# Patient Record
Sex: Male | Born: 1975
Health system: Southern US, Community
[De-identification: ages and names within clinical notes are randomized; demographics above are authoritative.]

## PROBLEM LIST (undated history)

## (undated) DIAGNOSIS — F32A Depression, unspecified: Secondary | ICD-10-CM

## (undated) DIAGNOSIS — T7840XA Allergy, unspecified, initial encounter: Secondary | ICD-10-CM

## (undated) DIAGNOSIS — I639 Cerebral infarction, unspecified: Secondary | ICD-10-CM

## (undated) DIAGNOSIS — K219 Gastro-esophageal reflux disease without esophagitis: Secondary | ICD-10-CM

## (undated) DIAGNOSIS — E119 Type 2 diabetes mellitus without complications: Secondary | ICD-10-CM

## (undated) DIAGNOSIS — I10 Essential (primary) hypertension: Secondary | ICD-10-CM

## (undated) DIAGNOSIS — R569 Unspecified convulsions: Secondary | ICD-10-CM

## (undated) DIAGNOSIS — G839 Paralytic syndrome, unspecified: Secondary | ICD-10-CM

## (undated) HISTORY — PX: APPENDECTOMY: SHX54

## (undated) HISTORY — DX: Essential (primary) hypertension: I10

## (undated) HISTORY — DX: Unspecified convulsions: R56.9

## (undated) HISTORY — DX: Allergy, unspecified, initial encounter: T78.40XA

## (undated) HISTORY — DX: Depression, unspecified: F32.A

## (undated) HISTORY — DX: Type 2 diabetes mellitus without complications: E11.9

---

## 2001-03-16 ENCOUNTER — Emergency Department (HOSPITAL_COMMUNITY): Admission: EM | Admit: 2001-03-16 | Discharge: 2001-03-16 | Payer: Self-pay | Admitting: Emergency Medicine

## 2004-01-09 ENCOUNTER — Emergency Department (HOSPITAL_COMMUNITY): Admission: EM | Admit: 2004-01-09 | Discharge: 2004-01-09 | Payer: Self-pay

## 2004-01-29 ENCOUNTER — Encounter: Admission: RE | Admit: 2004-01-29 | Discharge: 2004-04-28 | Payer: Self-pay | Admitting: Family Medicine

## 2011-11-19 ENCOUNTER — Ambulatory Visit (INDEPENDENT_AMBULATORY_CARE_PROVIDER_SITE_OTHER): Payer: Managed Care, Other (non HMO) | Admitting: Family Medicine

## 2011-11-19 ENCOUNTER — Encounter: Payer: Self-pay | Admitting: Family Medicine

## 2011-11-19 ENCOUNTER — Ambulatory Visit: Payer: Managed Care, Other (non HMO)

## 2011-11-19 VITALS — BP 165/105 | HR 59 | Temp 98.2°F | Resp 16 | Ht 72.0 in | Wt 244.8 lb

## 2011-11-19 DIAGNOSIS — S8990XA Unspecified injury of unspecified lower leg, initial encounter: Secondary | ICD-10-CM

## 2011-11-19 DIAGNOSIS — M25569 Pain in unspecified knee: Secondary | ICD-10-CM

## 2011-11-19 DIAGNOSIS — I10 Essential (primary) hypertension: Secondary | ICD-10-CM

## 2011-11-19 DIAGNOSIS — E7800 Pure hypercholesterolemia, unspecified: Secondary | ICD-10-CM

## 2011-11-19 DIAGNOSIS — E119 Type 2 diabetes mellitus without complications: Secondary | ICD-10-CM

## 2011-11-19 DIAGNOSIS — S99929A Unspecified injury of unspecified foot, initial encounter: Secondary | ICD-10-CM

## 2011-11-19 LAB — COMPREHENSIVE METABOLIC PANEL
ALT: 18 U/L (ref 0–53)
AST: 27 U/L (ref 0–37)
Alkaline Phosphatase: 83 U/L (ref 39–117)
Glucose, Bld: 95 mg/dL (ref 70–99)
Potassium: 4 mEq/L (ref 3.5–5.3)
Sodium: 142 mEq/L (ref 135–145)
Total Bilirubin: 0.8 mg/dL (ref 0.3–1.2)
Total Protein: 7.4 g/dL (ref 6.0–8.3)

## 2011-11-19 LAB — POCT GLYCOSYLATED HEMOGLOBIN (HGB A1C): Hemoglobin A1C: 6.2

## 2011-11-19 IMAGING — CR DG KNEE COMPLETE 4+V*L*
4 series · 4 of 4 positions shown · non-contrast
Comparison: None

CLINICAL DATA: Pain and swelling

LEFT KNEE - COMPLETE 4+ VIEW

[AP]
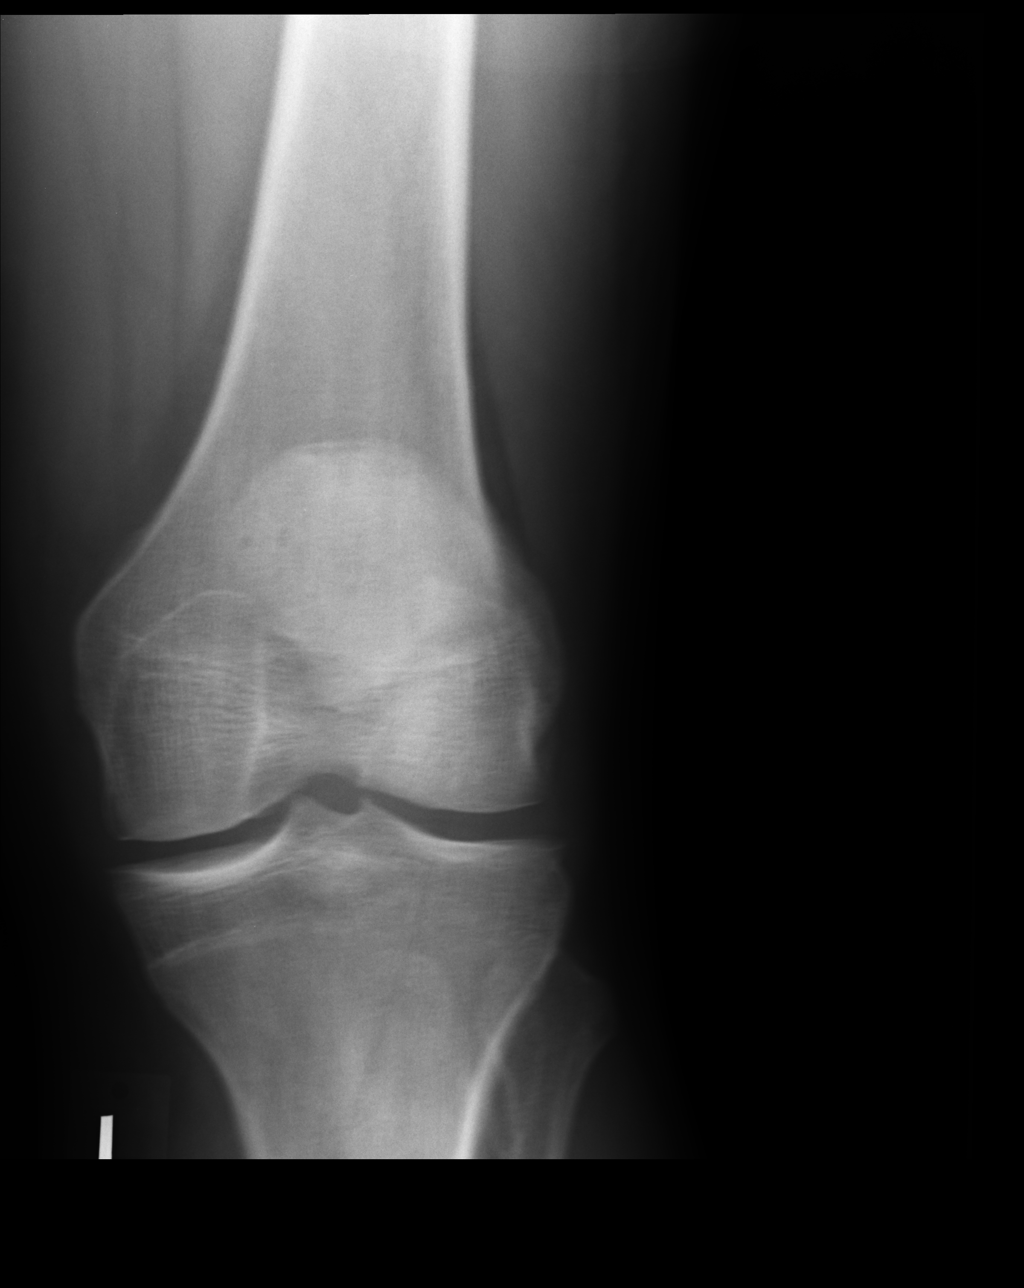

[lateral]
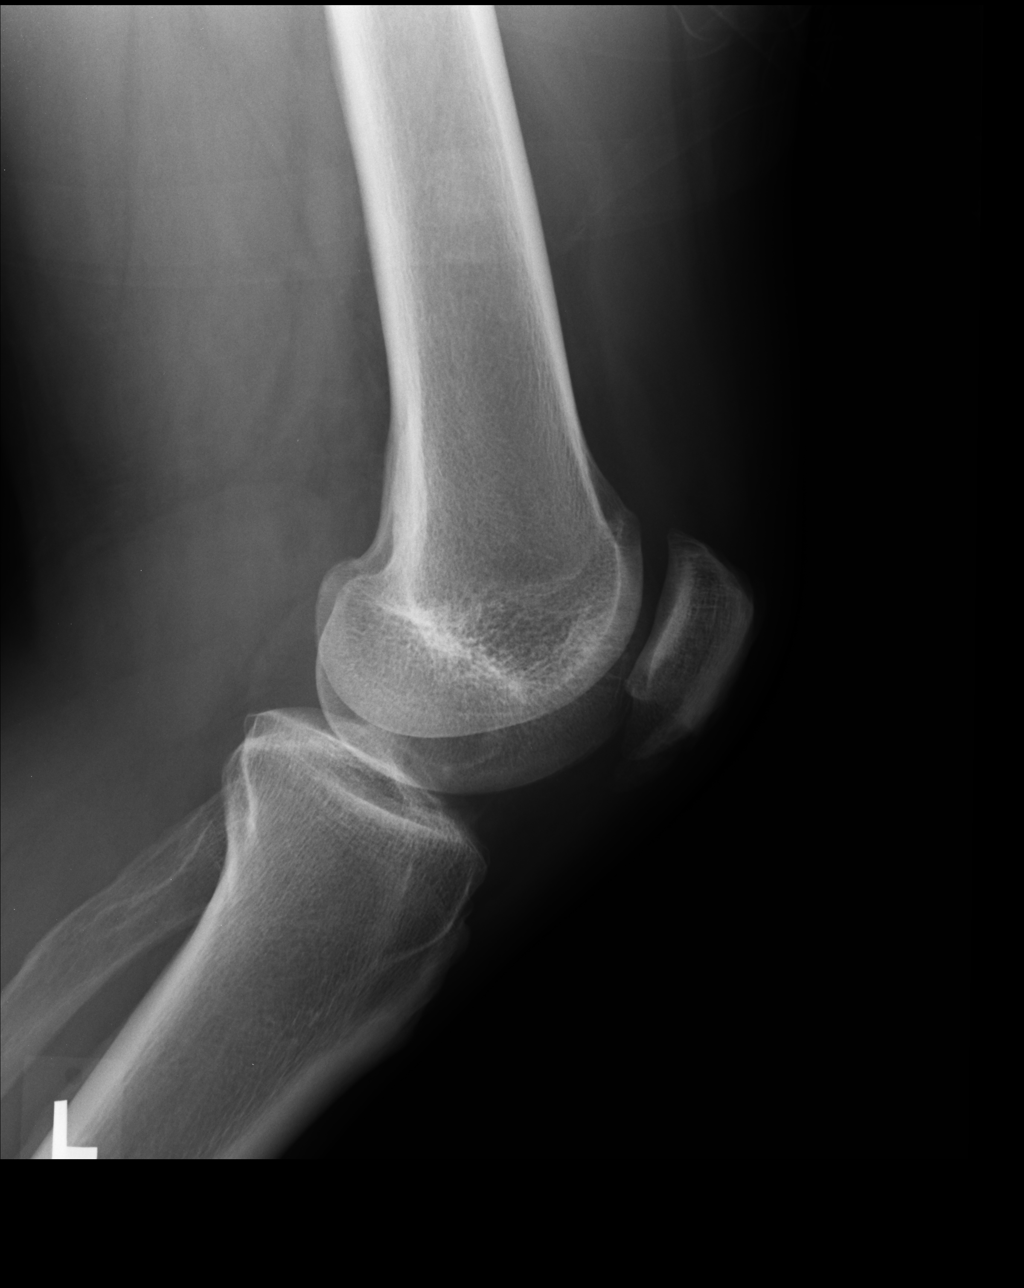

[ap ext rot]
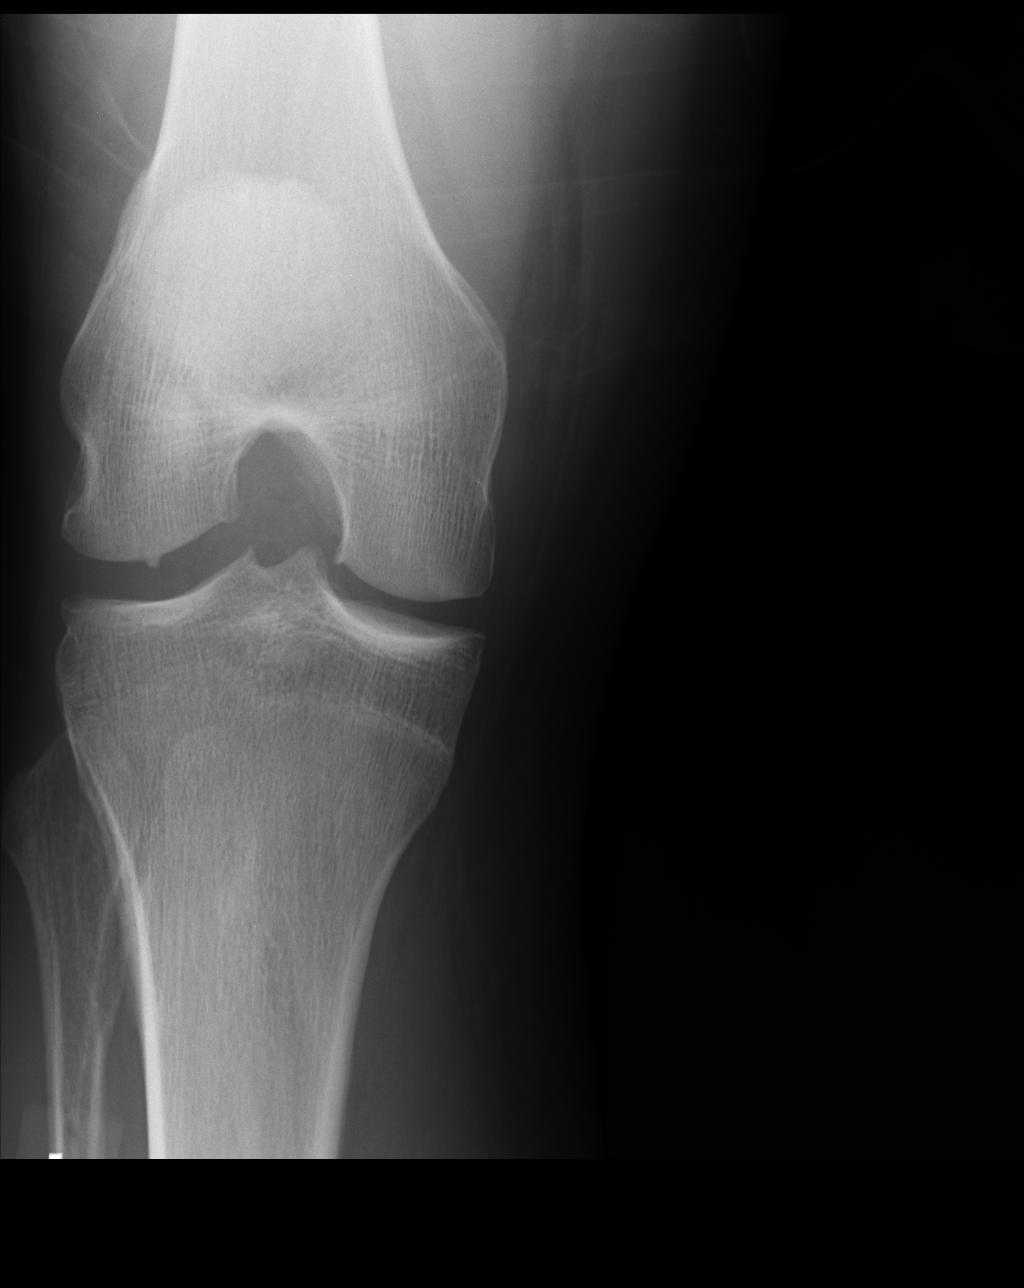

[ap int rot]
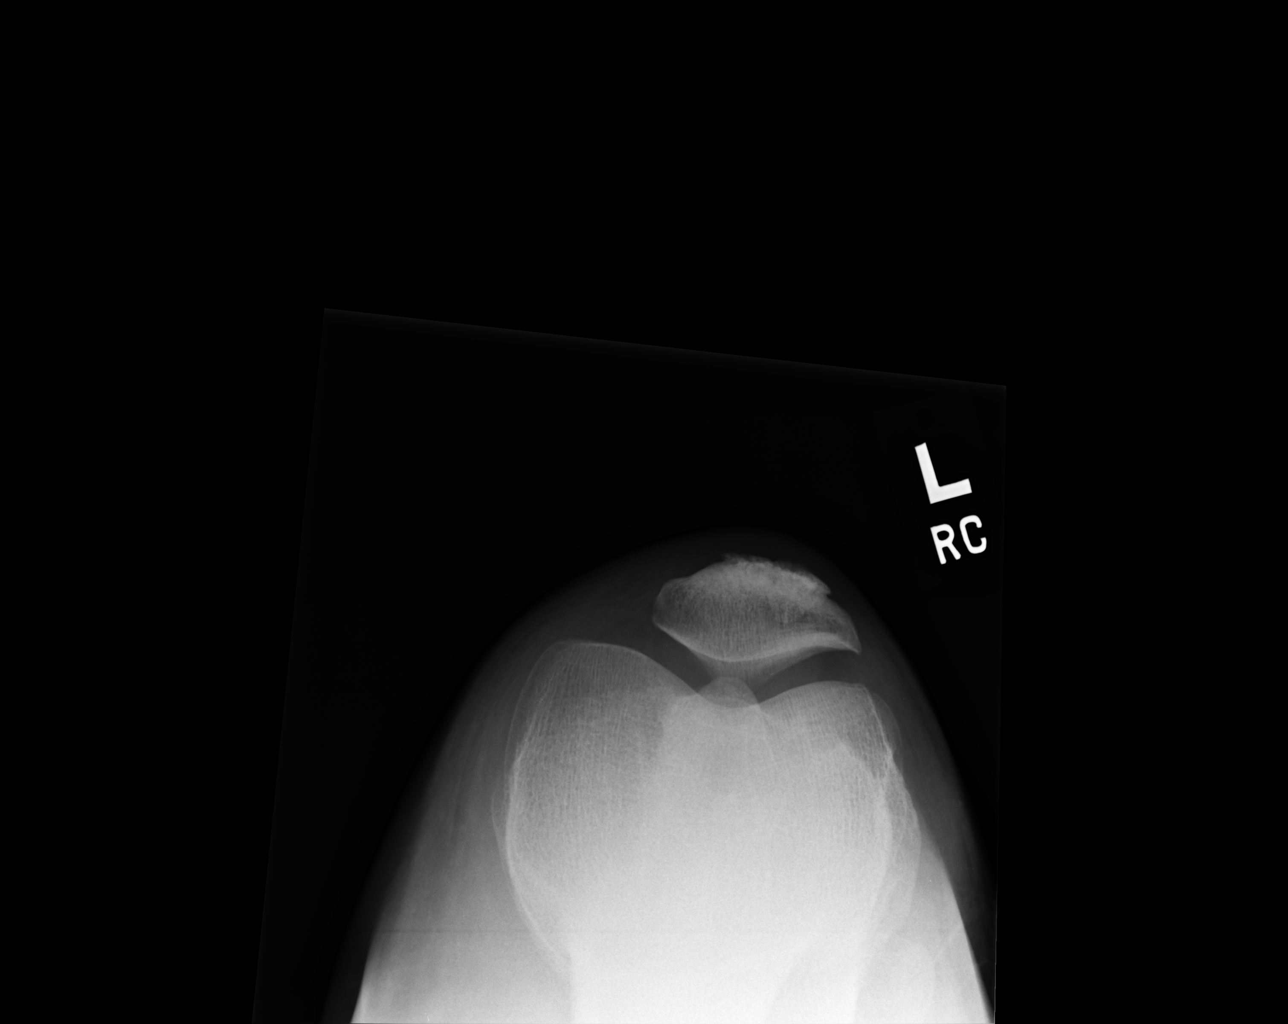

[4 of 4 positions shown; findings below may reference images not displayed]

FINDINGS: No joint effusion.  There is no fracture or subluxation identified.
There is mild osteoarthritis identified.  Changes include medial
compartment narrowing, sharpening of the tibial spines and marginal
spur formation.
IMPRESSION: 1.  No acute findings.
2.  Osteoarthritis.

Clinically significant discrepancy from primary report, if
provided: None

## 2011-11-19 MED ORDER — MELOXICAM 15 MG PO TABS: 15.0000 mg | ORAL_TABLET | Freq: Every day | ORAL | Status: DC

## 2011-11-19 MED ORDER — LISINOPRIL 20 MG PO TABS: 20.0000 mg | ORAL_TABLET | Freq: Every day | ORAL | Status: DC

## 2011-11-19 NOTE — Progress Notes (Signed)
Patient Name: Jacob [name] Date of Birth: 01-24-1976 Medical Record Number: [id number] Gender: male Date of Encounter: 11/19/2011  History of Present Illness:  Jacob [name] is a 36 y.o. very pleasant male patient who presents with the following:  He is here today as a new patient with a few issues.   He hurt his right knee about 2 weeks ago. He was playing with his kids- was more active than usual.  Then he was climbing stairs and felt tightness and swelling in the knee.  He has been trying to elevate and ice the knee.  He has had some trouble with this knee for a few years.  Never had any surgery.   The knees clicks and pops, does not get stuck.  He does note some instability as well.  It hurts along the medial joint line.    He also has DM and HTN.  He is not on any medication now.  He had been a patient at [facility], but now he has insurance again.  He has been out of medication for about a year.  He had been on metformin, lipitor and ?for BP.  He has been keeping an eye on his glucose and had a free cholesterol check.     There is no problem list on file for this patient.  No past medical history on file. No past surgical history on file. History  Substance Use Topics  . Smoking status: Never Smoker   . Smokeless tobacco: Not on file  . Alcohol Use: Not on file   No family history on file. No Known Allergies  Medication list has been reviewed and updated.  Prior to Admission medications   Not on File    Review of Systems:  As per HPI- otherwise negative.   Physical Examination: Filed Vitals:   11/19/11 0824  BP: 165/105  Pulse: 59  Temp: 98.2 F (36.8 C)  Resp: 16   Filed Vitals:   11/19/11 0824  Height: 6' (1.829 m)  Weight: 244 lb 12.8 oz (111.041 kg)   Body mass index is 33.20 kg/(m^2). Ideal Body Weight: Weight in (lb) to have BMI = 25: 183.9   GEN: WDWN, NAD, Non-toxic, A & O x 3 HEENT: Atraumatic, Normocephalic. Neck supple. No masses,  No LAD. Ears and Nose: No external deformity. CV: RRR, No M/G/R. No JVD. No thrill. No extra heart sounds. PULM: CTA B, no wheezes, crackles, rhonchi. No retractions. No resp. distress. No accessory muscle use. ABD: S, NT, ND, +BS. No rebound. No HSM. EXTR: No c/c/e NEURO Normal gait.  PSYCH: Normally interactive. Conversant. Not depressed or anxious appearing.  Calm demeanor.   [facility] reading (PRIMARY) by  Dr. Patsy Lager.  Normal left knee.   LEFT KNEE - COMPLETE 4+ VIEW  Comparison: None  Findings:  No joint effusion. There is no fracture or subluxation identified. There is mild osteoarthritis identified. Changes include medial compartment narrowing, sharpening of the tibial spines and marginal spur formation.  IMPRESSION:  1. No acute findings. 2. Osteoarthritis.  Results for orders placed in visit on 11/19/11  POCT GLYCOSYLATED HEMOGLOBIN (HGB A1C)      Component Value Range   Hemoglobin A1C 6.2      Assessment and Plan: 1. Knee pain  DG Knee Complete 4 Views Left, meloxicam (MOBIC) 15 MG tablet  2. Knee injury  DG Knee Complete 4 Views Left  3. Diabetes mellitus  POCT glycosylated hemoglobin (Hb A1C)  4. High  cholesterol  Comprehensive metabolic panel  5. Hypertension  Comprehensive metabolic panel, lisinopril (PRINIVIL,ZESTRIL) 20 MG tablet   Suspect that Jacob [name] has an meniscal tear in his knee.  Gave hinged knee brace and mobic to use prn.  He declined ortho referral for now, but will let me know if not doing better soon.   DM controlled with diet, does not need to restart medication at this time. Start lisinopril for HTN, follow up pending BW.  Plan recheck of HTN in about a month.   Abbe Amsterdam, MD

## 2011-11-19 NOTE — Patient Instructions (Addendum)
Wear your knee brace as needed, and use mobic when needed for pain.  If your knee continues to bother you a lot please call and I will refer you to orthopedics  Take a 1/2 of your lisinopril (blood pressure) medication for the first few days.  If your blood pressure is going under 125/80 on this dose, we will want to keep you on just 10 mg.  If your blood pressure remains higher go ahead and start taking a whole pill (20 mg).  I did blood work today to check on your kidneys.  I will be in touch with you with these results.    Let's check back in about one month for blood pressure evaluation.

## 2011-12-23 ENCOUNTER — Other Ambulatory Visit: Payer: Self-pay | Admitting: Family Medicine

## 2012-02-02 ENCOUNTER — Other Ambulatory Visit: Payer: Self-pay | Admitting: Physician Assistant

## 2013-10-15 ENCOUNTER — Ambulatory Visit (INDEPENDENT_AMBULATORY_CARE_PROVIDER_SITE_OTHER): Payer: BC Managed Care – PPO | Admitting: Family Medicine

## 2013-10-15 VITALS — BP 168/100 | HR 87 | Temp 98.6°F | Ht 72.0 in | Wt 235.4 lb

## 2013-10-15 DIAGNOSIS — I10 Essential (primary) hypertension: Secondary | ICD-10-CM

## 2013-10-15 DIAGNOSIS — J329 Chronic sinusitis, unspecified: Secondary | ICD-10-CM

## 2013-10-15 MED ORDER — HYDROCODONE-HOMATROPINE 5-1.5 MG/5ML PO SYRP: 5.0000 mL | ORAL_SOLUTION | Freq: Three times a day (TID) | ORAL | Status: DC | PRN

## 2013-10-15 MED ORDER — PREDNISONE 20 MG PO TABS: ORAL_TABLET | ORAL | Status: DC

## 2013-10-15 MED ORDER — AMLODIPINE BESYLATE 5 MG PO TABS
5.0000 mg | ORAL_TABLET | Freq: Every day | ORAL | Status: DC
Start: 2013-10-15 — End: 2018-02-23

## 2013-10-15 MED ORDER — AMOXICILLIN 875 MG PO TABS: 875.0000 mg | ORAL_TABLET | Freq: Two times a day (BID) | ORAL | Status: DC

## 2013-10-15 NOTE — Progress Notes (Signed)
    Patient ID: Jacob [name] MRN: [id number], DOB: 1976/01/19, 38 y.o. Date of Encounter: 10/15/2013, 2:25 PM  Primary Physician: No PCP Per Patient  Chief Complaint:  Chief Complaint  Patient presents with  . Sinusitis    x 2 weeks    HPI: 38 y.o. year old male presents with 10 day history of nasal congestion, post nasal drip, sore throat, sinus pressure, and cough. Afebrile. No chills. Nasal congestion thick and green/yellow. Sinus pressure is the worst symptom. Cough is productive secondary to post nasal drip and not associated with time of day. Ears feel full, leading to sensation of muffled hearing. Has tried OTC cold preps without success. No GI complaints.     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  No h/o recurrent sinus symptoms  No recent antibiotics, recent travels, vomiting, or sick contacts   No leg trauma, sedentary periods, h/o cancer, or tobacco use.  Past Medical History  Diagnosis Date  . Hypertension   . Allergy      Home Meds: Prior to Admission medications   Not on File    Allergies: No Known Allergies  History   Social History  . Marital Status: Single    Spouse Name: N/A    Number of Children: N/A  . Years of Education: N/A   Occupational History  . Not on file.   Social History Main Topics  . Smoking status: Never Smoker   . Smokeless tobacco: Not on file  . Alcohol Use: Not on file  . Drug Use: Not on file  . Sexual Activity: Not on file   Other Topics Concern  . Not on file   Social History Narrative  . No narrative on file     Review of Systems: Constitutional: negative for chills, fever, night sweats or weight changes Cardiovascular: negative for chest pain or palpitations Respiratory: negative for hemoptysis, wheezing, or shortness of breath Abdominal: negative for abdominal pain, nausea, vomiting or diarrhea Dermatological: negative for rash Neurologic: negative for headache   Physical Exam: Blood  pressure 168/100, pulse 87, temperature 98.6 F (37 C), temperature source Oral, height 6' (1.829 m), weight 235 lb 6.4 oz (106.777 kg), SpO2 98.00%., Body mass index is 31.92 kg/(m^2). General: Well developed, well nourished, in no acute distress. Head: Normocephalic, atraumatic, eyes without discharge, sclera non-icteric, nares are congested. Bilateral auditory canals clear, TM's are without perforation, pearly grey with reflective cone of light bilaterally. Serous effusion bilaterally behind TM's. Maxillary sinus TTP. Oral cavity moist, dentition normal. Posterior pharynx with post nasal drip and mild erythema. No peritonsillar abscess or tonsillar exudate. Neck: Supple. No thyromegaly. Full ROM. No lymphadenopathy. Lungs: Clear bilaterally to auscultation without wheezes, rales, or rhonchi. Breathing is unlabored.  Heart: RRR with S1 S2. No murmurs, rubs, or gallops appreciated. Msk:  Strength and tone normal for age. Extremities: No clubbing or cyanosis. No edema. Neuro: Alert and oriented X 3. Moves all extremities spontaneously. CNII-XII grossly in tact. Psych:  Responds to questions appropriately with a normal affect.     ASSESSMENT AND PLAN:  38 y.o. year old male with sinusitis Sinus abscess - Plan: HYDROcodone-homatropine (HYCODAN) 5-1.5 MG/5ML syrup, predniSONE (DELTASONE) 20 MG tablet, amoxicillin (AMOXIL) 875 MG tablet  Hypertension Amlodipine 5 mg qd Recheck bp in one month  Signed, Elvina Sidle, MD   -  -Tylenol/Motrin prn -Rest/fluids -RTC precautions -RTC 3-5 days if no improvement  Signed, Elvina Sidle, MD 10/15/2013 2:25 PM

## 2013-10-15 NOTE — Patient Instructions (Signed)
Return in 1 month if blood pressure remains elevated.    Sinusitis Sinusitis is redness, soreness, and swelling (inflammation) of the paranasal sinuses. Paranasal sinuses are air pockets within the bones of your face (beneath the eyes, the middle of the forehead, or above the eyes). In healthy paranasal sinuses, mucus is able to drain out, and air is able to circulate through them by way of your nose. However, when your paranasal sinuses are inflamed, mucus and air can become trapped. This can allow bacteria and other germs to grow and cause infection. Sinusitis can develop quickly and last only a short time (acute) or continue over a long period (chronic). Sinusitis that lasts for more than 12 weeks is considered chronic.  CAUSES  Causes of sinusitis include:  Allergies.  Structural abnormalities, such as displacement of the cartilage that separates your nostrils (deviated septum), which can decrease the air flow through your nose and sinuses and affect sinus drainage.  Functional abnormalities, such as when the small hairs (cilia) that line your sinuses and help remove mucus do not work properly or are not present. SYMPTOMS  Symptoms of acute and chronic sinusitis are the same. The primary symptoms are pain and pressure around the affected sinuses. Other symptoms include:  Upper toothache.  Earache.  Headache.  Bad breath.  Decreased sense of smell and taste.  A cough, which worsens when you are lying flat.  Fatigue.  Fever.  Thick drainage from your nose, which often is green and may contain pus (purulent).  Swelling and warmth over the affected sinuses. DIAGNOSIS  Your caregiver will perform a physical exam. During the exam, your caregiver may:  Look in your nose for signs of abnormal growths in your nostrils (nasal polyps).  Tap over the affected sinus to check for signs of infection.  View the inside of your sinuses (endoscopy) with a special imaging device with a  light attached (endoscope), which is inserted into your sinuses. If your caregiver suspects that you have chronic sinusitis, one or more of the following tests may be recommended:  Allergy tests.  Nasal culture A sample of mucus is taken from your nose and sent to a lab and screened for bacteria.  Nasal cytology A sample of mucus is taken from your nose and examined by your caregiver to determine if your sinusitis is related to an allergy. TREATMENT  Most cases of acute sinusitis are related to a viral infection and will resolve on their own within 10 days. Sometimes medicines are prescribed to help relieve symptoms (pain medicine, decongestants, nasal steroid sprays, or saline sprays).  However, for sinusitis related to a bacterial infection, your caregiver will prescribe antibiotic medicines. These are medicines that will help kill the bacteria causing the infection.  Rarely, sinusitis is caused by a fungal infection. In theses cases, your caregiver will prescribe antifungal medicine. For some cases of chronic sinusitis, surgery is needed. Generally, these are cases in which sinusitis recurs more than 3 times per year, despite other treatments. HOME CARE INSTRUCTIONS   Drink plenty of water. Water helps thin the mucus so your sinuses can drain more easily.  Use a humidifier.  Inhale steam 3 to 4 times a day (for example, sit in the bathroom with the shower running).  Apply a warm, moist washcloth to your face 3 to 4 times a day, or as directed by your caregiver.  Use saline nasal sprays to help moisten and clean your sinuses.  Take over-the-counter or prescription medicines for pain,  discomfort, or fever only as directed by your caregiver. SEEK IMMEDIATE MEDICAL CARE IF:  You have increasing pain or severe headaches.  You have nausea, vomiting, or drowsiness.  You have swelling around your face.  You have vision problems.  You have a stiff neck.  You have difficulty  breathing. MAKE SURE YOU:   Understand these instructions.  Will watch your condition.  Will get help right away if you are not doing well or get worse. Document Released: 05/11/2005 Document Revised: 08/03/2011 Document Reviewed: 05/26/2011 [facility] Patient Information 2014 [place], [place].

## 2013-10-23 ENCOUNTER — Emergency Department (HOSPITAL_COMMUNITY): Payer: BC Managed Care – PPO

## 2013-10-23 ENCOUNTER — Observation Stay (HOSPITAL_COMMUNITY)
Admission: EM | Admit: 2013-10-23 | Discharge: 2013-10-25 | Disposition: A | Discharge status: Home / Self Care | Payer: BC Managed Care – PPO | Attending: Surgery | Admitting: Surgery

## 2013-10-23 ENCOUNTER — Encounter (HOSPITAL_COMMUNITY): Payer: Self-pay | Admitting: Emergency Medicine

## 2013-10-23 DIAGNOSIS — R1031 Right lower quadrant pain: Secondary | ICD-10-CM | POA: Insufficient documentation

## 2013-10-23 DIAGNOSIS — R112 Nausea with vomiting, unspecified: Secondary | ICD-10-CM | POA: Insufficient documentation

## 2013-10-23 DIAGNOSIS — IMO0002 Reserved for concepts with insufficient information to code with codable children: Secondary | ICD-10-CM | POA: Insufficient documentation

## 2013-10-23 DIAGNOSIS — Z79899 Other long term (current) drug therapy: Secondary | ICD-10-CM | POA: Insufficient documentation

## 2013-10-23 DIAGNOSIS — K3580 Unspecified acute appendicitis: Secondary | ICD-10-CM | POA: Diagnosis present

## 2013-10-23 DIAGNOSIS — I10 Essential (primary) hypertension: Secondary | ICD-10-CM | POA: Insufficient documentation

## 2013-10-23 DIAGNOSIS — K3533 Acute appendicitis with perforation and localized peritonitis, with abscess: Principal | ICD-10-CM | POA: Insufficient documentation

## 2013-10-23 LAB — CBC WITH DIFFERENTIAL/PLATELET
Basophils Absolute: 0 10*3/uL (ref 0.0–0.1)
Basophils Relative: 0 % (ref 0–1)
Eosinophils Absolute: 0 10*3/uL (ref 0.0–0.7)
Eosinophils Relative: 0 % (ref 0–5)
HCT: 45.9 % (ref 39.0–52.0)
Hemoglobin: 16.1 g/dL (ref 13.0–17.0)
Lymphocytes Relative: 18 % (ref 12–46)
Lymphs Abs: 2.7 10*3/uL (ref 0.7–4.0)
MCH: 29.1 pg (ref 26.0–34.0)
MCHC: 35.1 g/dL (ref 30.0–36.0)
MCV: 82.9 fL (ref 78.0–100.0)
Monocytes Absolute: 1 10*3/uL (ref 0.1–1.0)
Monocytes Relative: 7 % (ref 3–12)
Neutro Abs: 11.1 10*3/uL — ABNORMAL HIGH (ref 1.7–7.7)
Neutrophils Relative %: 75 % (ref 43–77)
Platelets: 211 10*3/uL (ref 150–400)
RBC: 5.54 MIL/uL (ref 4.22–5.81)
RDW: 12.8 % (ref 11.5–15.5)
WBC: 14.9 10*3/uL — ABNORMAL HIGH (ref 4.0–10.5)

## 2013-10-23 LAB — URINALYSIS, ROUTINE W REFLEX MICROSCOPIC
Bilirubin Urine: NEGATIVE
Glucose, UA: NEGATIVE mg/dL
Hgb urine dipstick: NEGATIVE
Ketones, ur: NEGATIVE mg/dL
Leukocytes, UA: NEGATIVE
Nitrite: NEGATIVE
Protein, ur: 30 mg/dL — AB
Specific Gravity, Urine: 1.026 (ref 1.005–1.030)
Urobilinogen, UA: 1 mg/dL (ref 0.0–1.0)
pH: 7 (ref 5.0–8.0)

## 2013-10-23 LAB — COMPREHENSIVE METABOLIC PANEL
ALT: 21 U/L (ref 0–53)
AST: 25 U/L (ref 0–37)
Albumin: 3.5 g/dL (ref 3.5–5.2)
Alkaline Phosphatase: 120 U/L — ABNORMAL HIGH (ref 39–117)
BUN: 11 mg/dL (ref 6–23)
CO2: 28 mEq/L (ref 19–32)
Calcium: 9.7 mg/dL (ref 8.4–10.5)
Chloride: 99 mEq/L (ref 96–112)
Creatinine, Ser: 1.04 mg/dL (ref 0.50–1.35)
GFR calc Af Amer: >90 mL/min (ref >90)
GFR calc non Af Amer: 88 mL/min — ABNORMAL LOW (ref >90)
Glucose, Bld: 126 mg/dL — ABNORMAL HIGH (ref 70–99)
Potassium: 4 mEq/L (ref 3.7–5.3)
Sodium: 138 mEq/L (ref 137–147)
Total Bilirubin: 1.1 mg/dL (ref 0.3–1.2)
Total Protein: 8.1 g/dL (ref 6.0–8.3)

## 2013-10-23 LAB — URINE MICROSCOPIC-ADD ON

## 2013-10-23 LAB — LIPASE, BLOOD: Lipase: 20 U/L (ref 11–59)

## 2013-10-23 IMAGING — CT CT ABD-PELV W/ CM
1 of 3 series · 14 of 32 positions shown, 19 images · IV contrast (omnipaque)
Comparison: None.

CLINICAL DATA: Sharp right-sided abdominal pain. Evaluate for
appendicitis.

EXAM:
CT ABDOMEN AND PELVIS WITH CONTRAST
TECHNIQUE: Multidetector CT imaging of the abdomen and pelvis was performed
using the standard protocol following bolus administration of
intravenous contrast.
CONTRAST:  50mL OMNIPAQUE IOHEXOL 300 MG/ML SOLN, 100mL OMNIPAQUE
IOHEXOL 300 MG/ML SOLN

[Series 2: abd/pel with · axial · 0.74mm/px · z∈[-451,-56]mm · 14 of 89 slices shown, 19 images]
[im 5/89  soft-tissue]
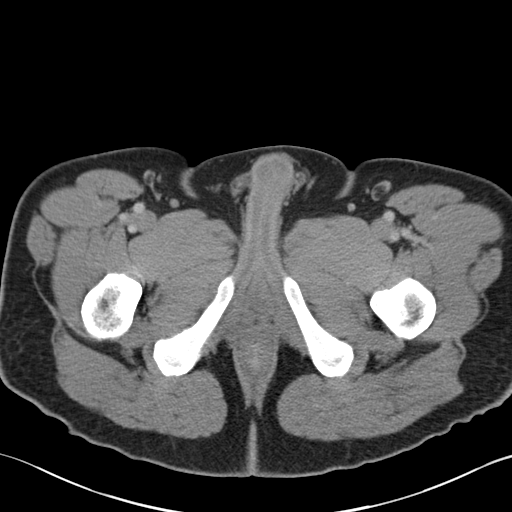
[im 5/89  bone]
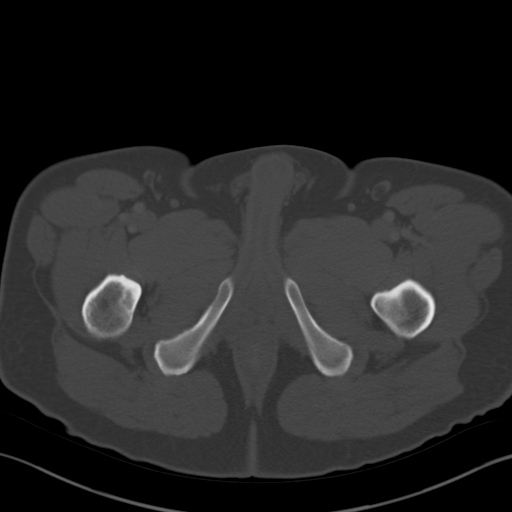
[im 14/89  soft-tissue]
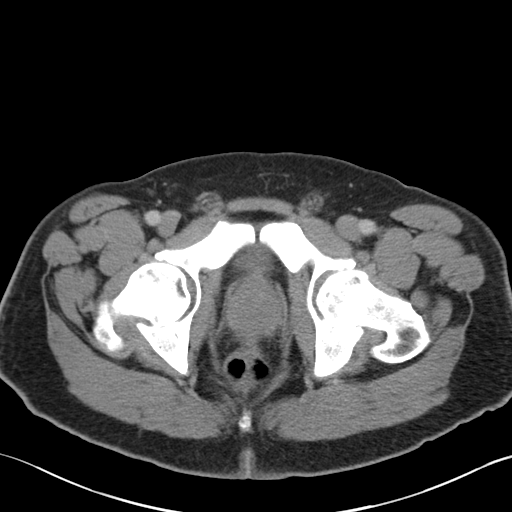
[im 18/89  soft-tissue]
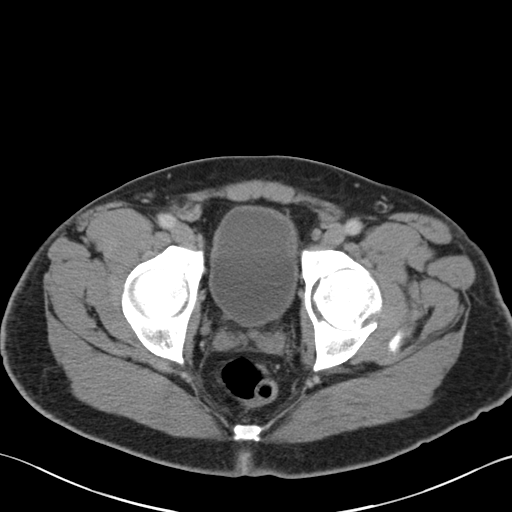
[im 27/89  soft-tissue]
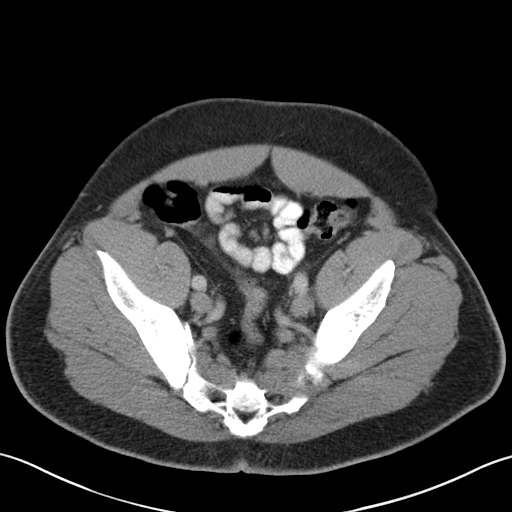
[im 31/89  soft-tissue]
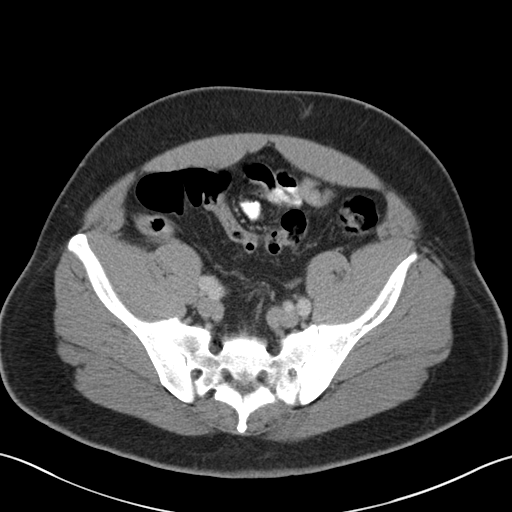
[im 40/89  soft-tissue]
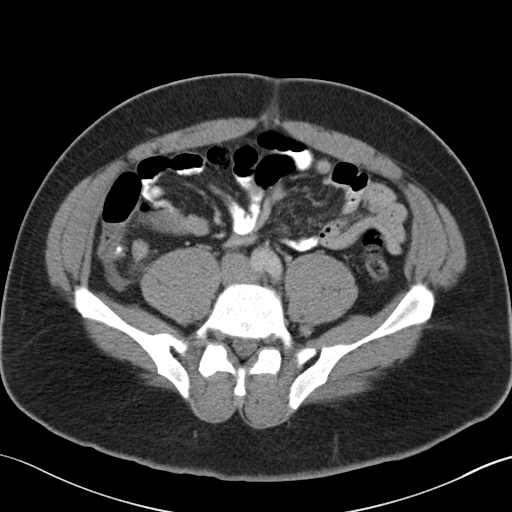
[im 45/89  soft-tissue]
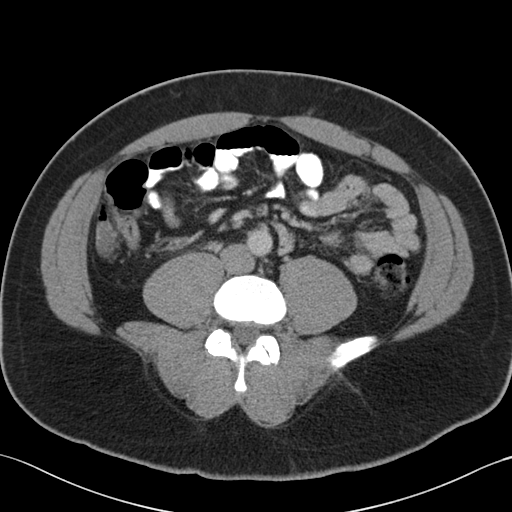
[im 49/89  soft-tissue]
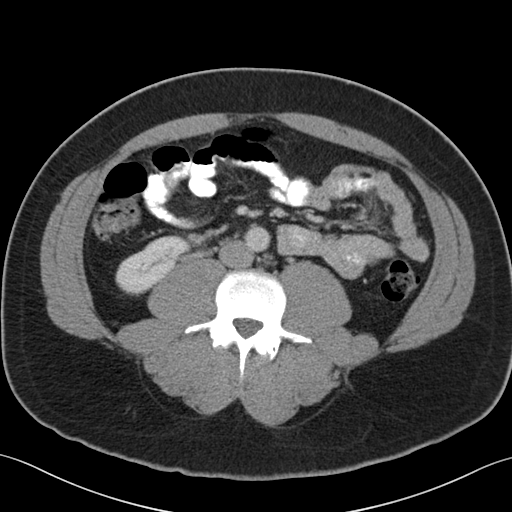
[im 58/89  soft-tissue]
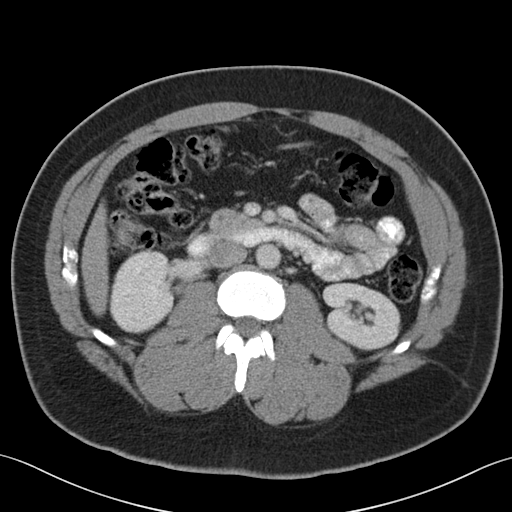
[im 58/89  bone]
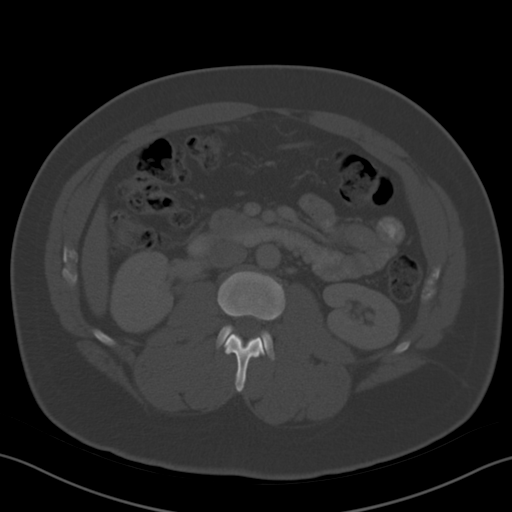
[im 62/89  soft-tissue]
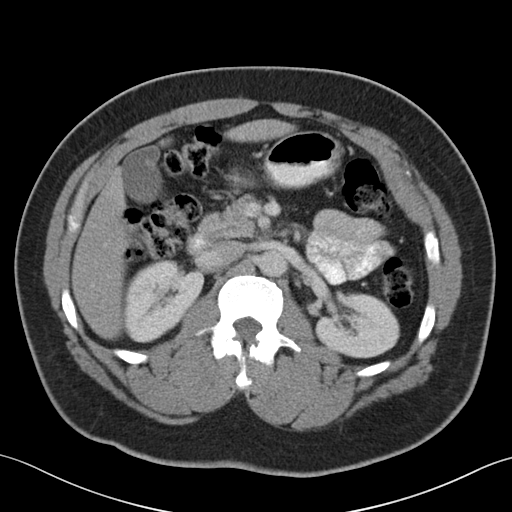
[im 71/89  soft-tissue]
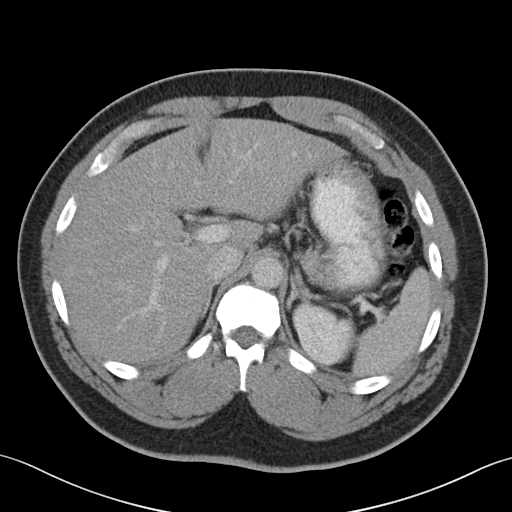
[im 71/89  lung]
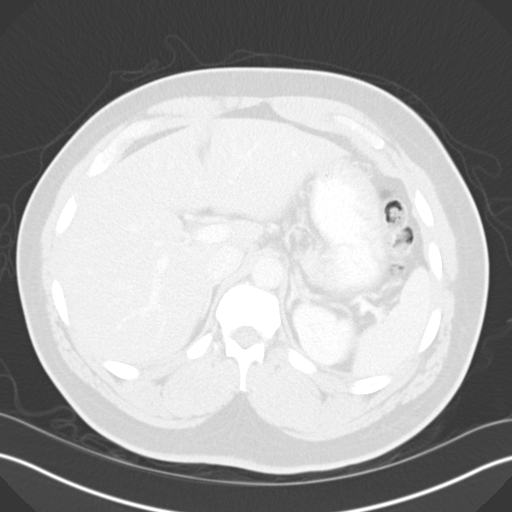
[im 75/89  soft-tissue]
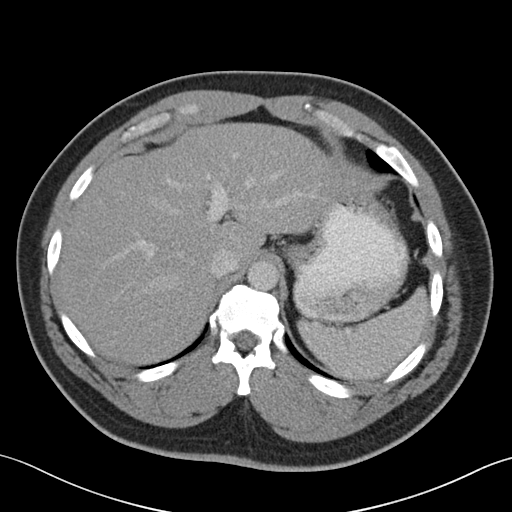
[im 75/89  lung]
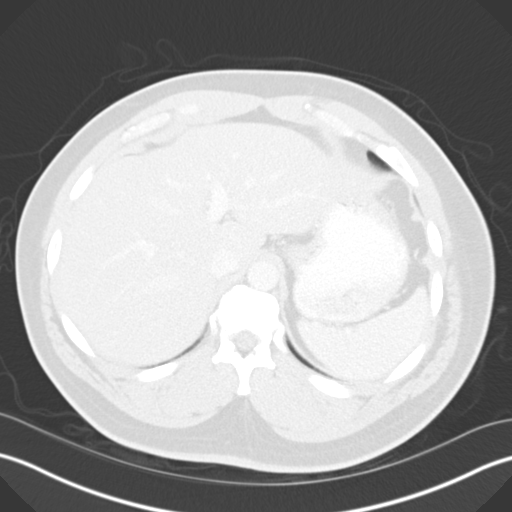
[im 80/89  lung]
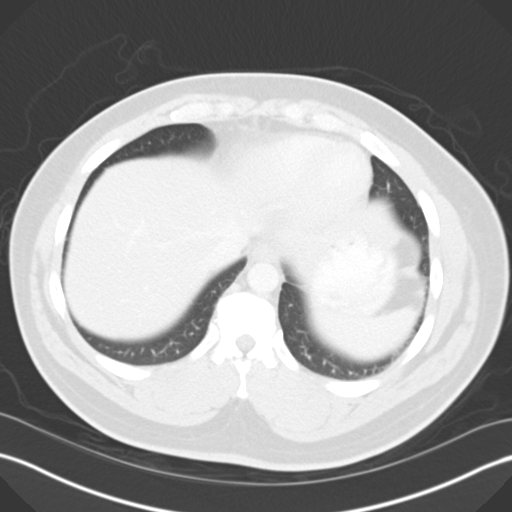
[im 84/89  soft-tissue]
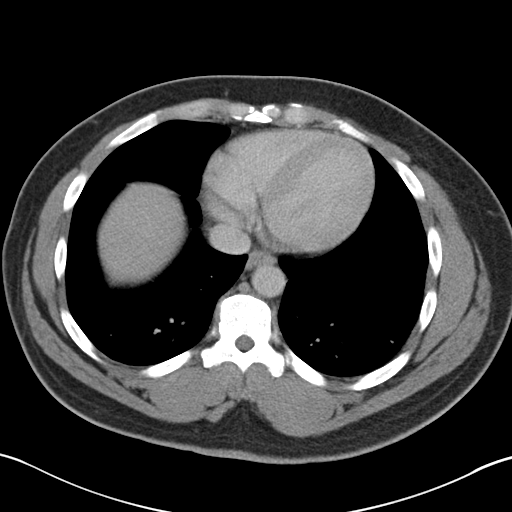
[im 84/89  lung]
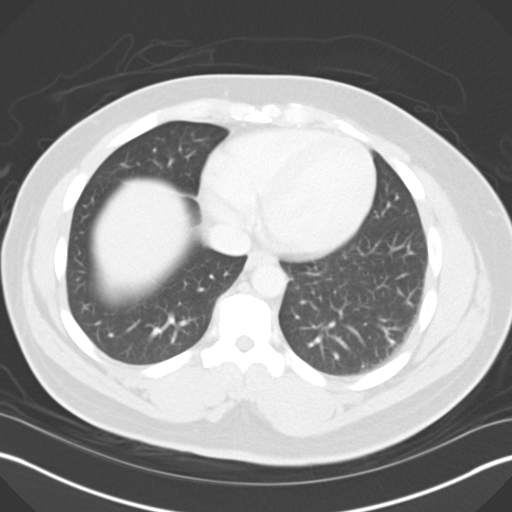

[14 of 32 positions shown; findings below may reference images not displayed]

FINDINGS: BODY WALL: Unremarkable.

LOWER CHEST: There is partly imaged airspace disease in the right
middle lobe.

ABDOMEN/PELVIS:

Liver: No focal abnormality.

Biliary: No evidence of biliary obstruction or stone.

Pancreas: Unremarkable.

Spleen: Unremarkable.

Adrenals: Unremarkable.

Kidneys and ureters: No hydronephrosis or stone.

Bladder: Unremarkable.

Reproductive: Unremarkable.

Bowel: There is appendicolith at the base of the appendix, with the
appendix mildly dilated by fluid, up to 11 mm outer wall diameter.
There is local peritoneal fluid and fat stranding in the right lower
quadrant. No evidence of perforation or abscess. Retroperitoneum: No
mass or adenopathy.

Peritoneum: No free fluid or gas.

Vascular: No acute abnormality.

OSSEOUS: SI joint osteoarthritis with bridging osteophytes on the
right.

Critical Value/emergent results were called by telephone at the time
of interpretation on [DATE] at [DATE] to Dr. TIGER , who
verbally acknowledged these results.
IMPRESSION: 1. Acute, non perforated appendicitis.
2. Right middle lobe airspace disease has the appearance of
pneumonia. Correlate with respiratory symptoms and followup to
clearing.

## 2013-10-23 IMAGING — CR DG CHEST 2V
2 series · 2 of 2 positions shown · non-contrast
Comparison: None.

CLINICAL DATA: Upper abdominal pain.  Cough.

EXAM:
CHEST  2 VIEW

[w chest pa]
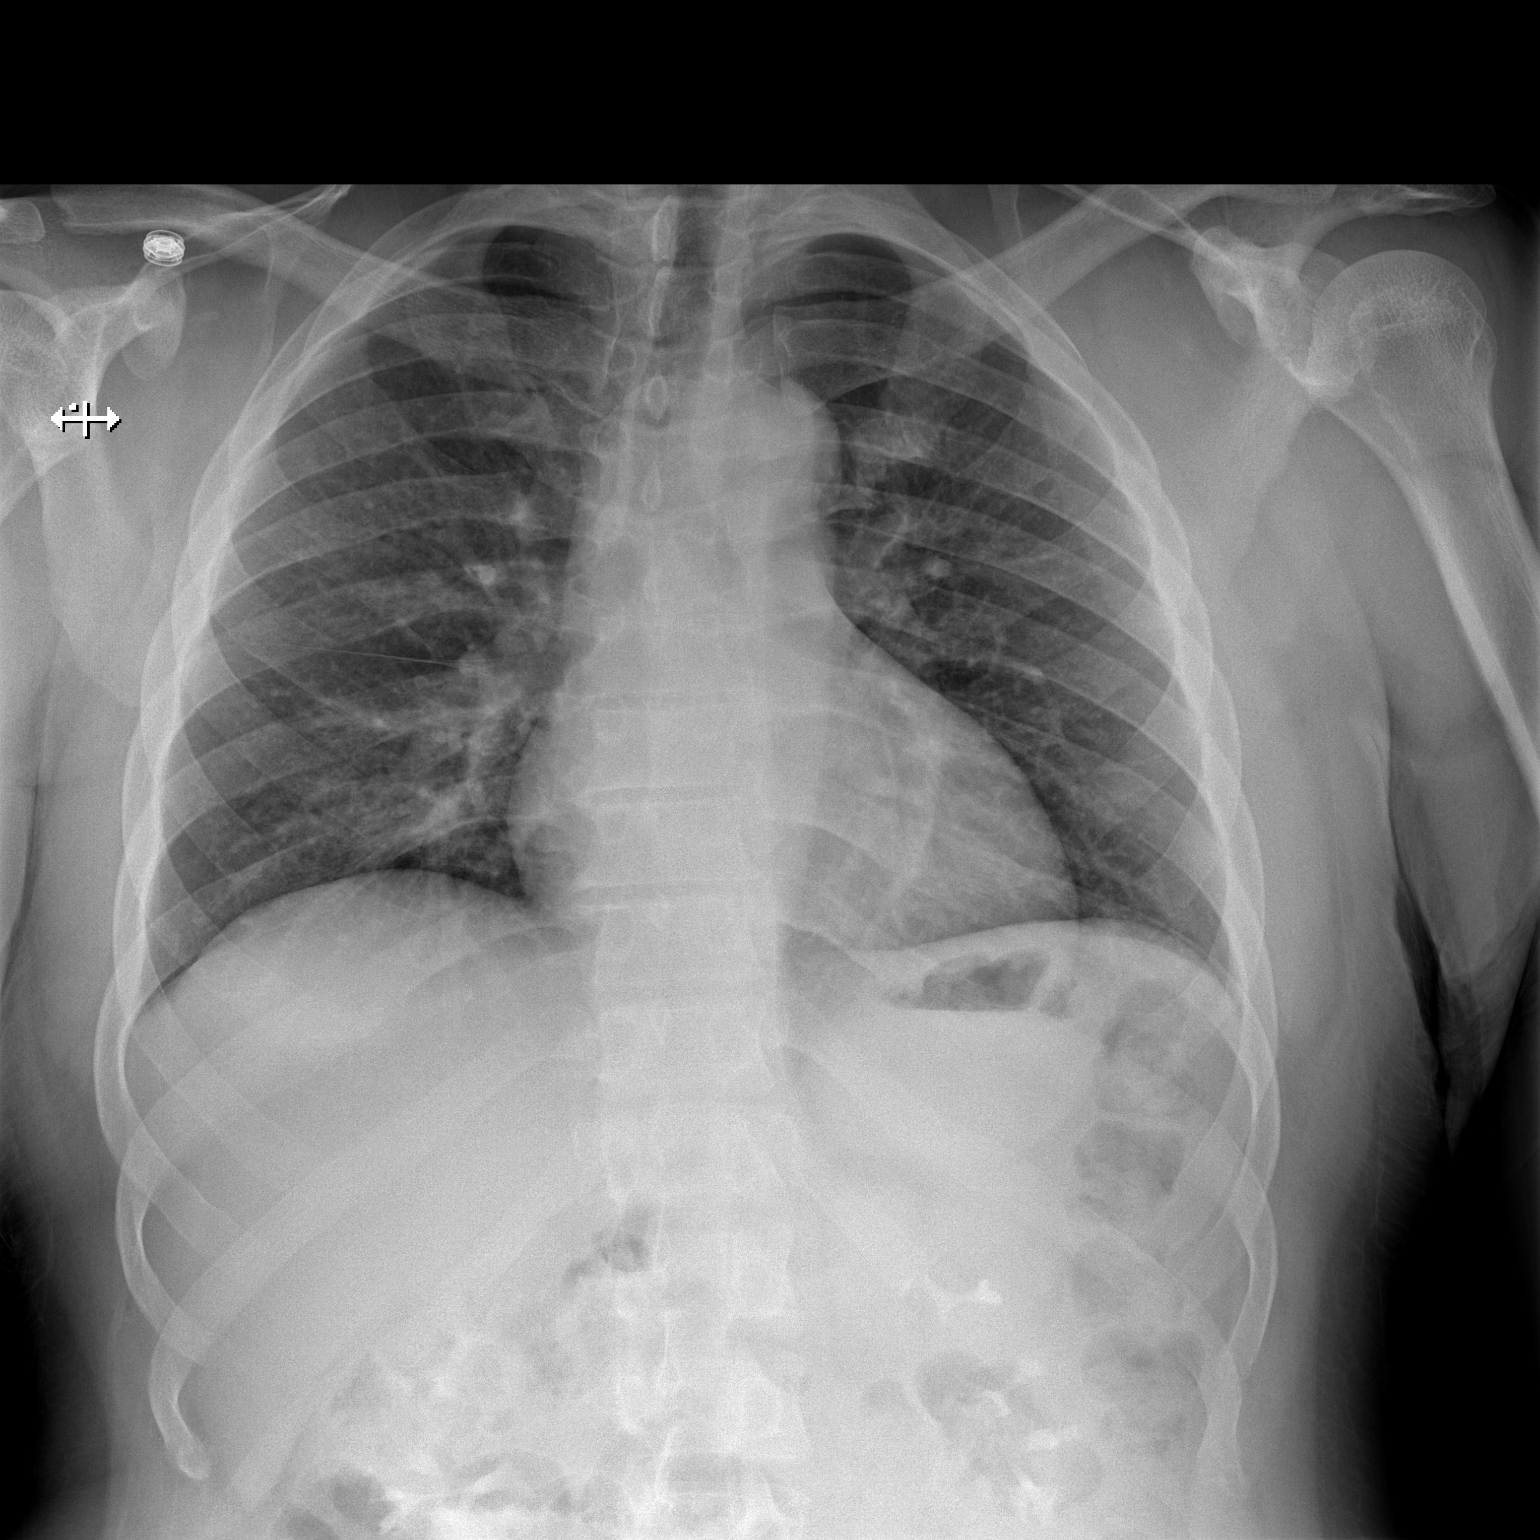

[w chest lat]
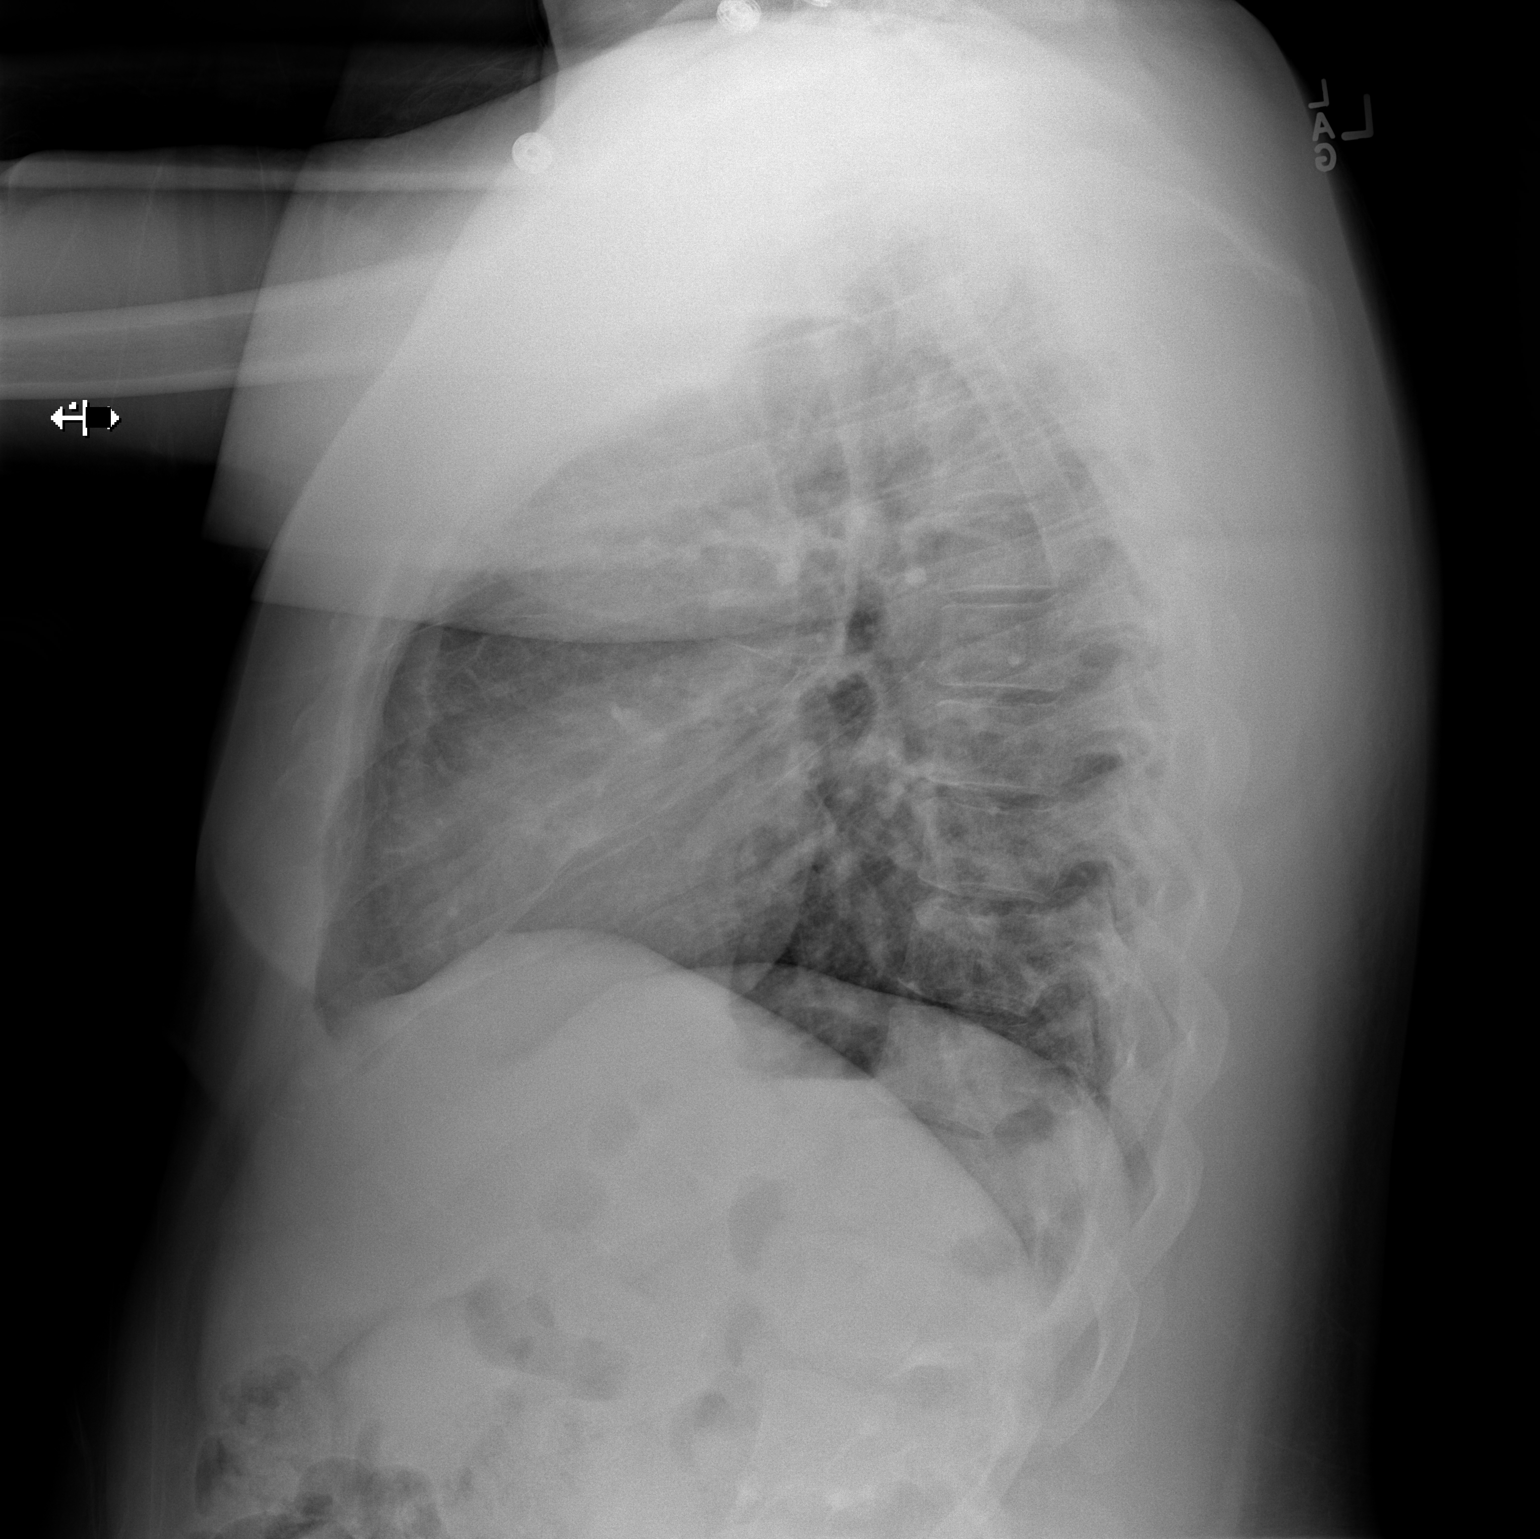

[2 of 2 positions shown; findings below may reference images not displayed]

FINDINGS: Normal heart, mediastinum hila.

Clear lungs.  No pleural effusion.  No pneumothorax.

The bony thorax is intact.
IMPRESSION: No active cardiopulmonary disease.

## 2013-10-23 MED ORDER — IOHEXOL 300 MG/ML  SOLN
100.0000 mL | Freq: Once | INTRAMUSCULAR | Status: AC | PRN
  Administered 2013-10-23: 100 mL via INTRAVENOUS

## 2013-10-23 MED ORDER — HYDROMORPHONE HCL PF 1 MG/ML IJ SOLN
1.0000 mg | Freq: Once | INTRAMUSCULAR | Status: AC
  Administered 2013-10-23: 1 mg via INTRAVENOUS
  Filled 2013-10-23: qty 1

## 2013-10-23 MED ORDER — KETOROLAC TROMETHAMINE 15 MG/ML IJ SOLN
15.0000 mg | Freq: Once | INTRAMUSCULAR | Status: AC
  Administered 2013-10-23: 15 mg via INTRAVENOUS
  Filled 2013-10-23: qty 1

## 2013-10-23 MED ORDER — IOHEXOL 300 MG/ML  SOLN
50.0000 mL | Freq: Once | INTRAMUSCULAR | Status: AC | PRN
  Administered 2013-10-23: 50 mL via ORAL

## 2013-10-23 MED ORDER — PIPERACILLIN-TAZOBACTAM 3.375 G IVPB 30 MIN
3.3750 g | Freq: Once | INTRAVENOUS | Status: AC
  Administered 2013-10-23: 3.375 g via INTRAVENOUS
  Filled 2013-10-23: qty 50

## 2013-10-23 NOTE — H&P (Signed)
Jacob [name] is an 38 y.o. male.   Chief Complaint: abdominal pain HPI: The pt is a 38 yo bm who began having RLQ pain this afternoon. He has had nausea and vomiting associated with it. He has had a small amount of diarrhea. Pain gradually worsened so he came to ER. CT shows appendicitis but no rupture  Past Medical History  Diagnosis Date  . Hypertension   . Allergy     History reviewed. No pertinent past surgical history.  Family History  Problem Relation Age of Onset  . Asthma Son    Social History:  reports that he has never smoked. He has never used smokeless tobacco. He reports that he drinks alcohol. He reports that he does not use illicit drugs.  Allergies: No Known Allergies   (Not in a hospital admission)  Results for orders placed during the hospital encounter of 10/23/13 (from the past 48 hour(s))  CBC WITH DIFFERENTIAL     Status: Abnormal   Collection Time    10/23/13  8:06 PM      Result Value Ref Range   WBC 14.9 (*) 4.0 - 10.5 K/uL   RBC 5.54  4.22 - 5.81 MIL/uL   Hemoglobin 16.1  13.0 - 17.0 g/dL   HCT 45.9  39.0 - 52.0 %   MCV 82.9  78.0 - 100.0 fL   MCH 29.1  26.0 - 34.0 pg   MCHC 35.1  30.0 - 36.0 g/dL   RDW 12.8  11.5 - 15.5 %   Platelets 211  150 - 400 K/uL   Neutrophils Relative % 75  43 - 77 %   Neutro Abs 11.1 (*) 1.7 - 7.7 K/uL   Lymphocytes Relative 18  12 - 46 %   Lymphs Abs 2.7  0.7 - 4.0 K/uL   Monocytes Relative 7  3 - 12 %   Monocytes Absolute 1.0  0.1 - 1.0 K/uL   Eosinophils Relative 0  0 - 5 %   Eosinophils Absolute 0.0  0.0 - 0.7 K/uL   Basophils Relative 0  0 - 1 %   Basophils Absolute 0.0  0.0 - 0.1 K/uL  COMPREHENSIVE METABOLIC PANEL     Status: Abnormal   Collection Time    10/23/13  8:06 PM      Result Value Ref Range   Sodium 138  137 - 147 mEq/L   Potassium 4.0  3.7 - 5.3 mEq/L   Chloride 99  96 - 112 mEq/L   CO2 28  19 - 32 mEq/L   Glucose, Bld 126 (*) 70 - 99 mg/dL   BUN 11  6 - 23 mg/dL   Creatinine, Ser 1.04   0.50 - 1.35 mg/dL   Calcium 9.7  8.4 - 10.5 mg/dL   Total Protein 8.1  6.0 - 8.3 g/dL   Albumin 3.5  3.5 - 5.2 g/dL   AST 25  0 - 37 U/L   ALT 21  0 - 53 U/L   Alkaline Phosphatase 120 (*) 39 - 117 U/L   Total Bilirubin 1.1  0.3 - 1.2 mg/dL   GFR calc non Af Amer 88 (*) >90 mL/min   GFR calc Af Amer >90  >90 mL/min   Comment: (NOTE)     The eGFR has been calculated using the CKD EPI equation.     This calculation has not been validated in all clinical situations.     eGFR's persistently <90 mL/min signify possible Chronic Kidney  Disease.  LIPASE, BLOOD     Status: None   Collection Time    10/23/13  8:06 PM      Result Value Ref Range   Lipase 20  11 - 59 U/L  URINALYSIS, ROUTINE W REFLEX MICROSCOPIC     Status: Abnormal   Collection Time    10/23/13  9:08 PM      Result Value Ref Range   Color, Urine YELLOW  YELLOW   APPearance CLEAR  CLEAR   Specific Gravity, Urine 1.026  1.005 - 1.030   pH 7.0  5.0 - 8.0   Glucose, UA NEGATIVE  NEGATIVE mg/dL   Hgb urine dipstick NEGATIVE  NEGATIVE   Bilirubin Urine NEGATIVE  NEGATIVE   Ketones, ur NEGATIVE  NEGATIVE mg/dL   Protein, ur 30 (*) NEGATIVE mg/dL   Urobilinogen, UA 1.0  0.0 - 1.0 mg/dL   Nitrite NEGATIVE  NEGATIVE   Leukocytes, UA NEGATIVE  NEGATIVE  URINE MICROSCOPIC-ADD ON     Status: None   Collection Time    10/23/13  9:08 PM      Result Value Ref Range   Squamous Epithelial / LPF RARE  RARE   WBC, UA 0-2  <3 WBC/hpf   RBC / HPF 0-2  <3 RBC/hpf   Urine-Other MUCOUS PRESENT     Ct Abdomen Pelvis W Contrast  10/23/2013   CLINICAL DATA:  Sharp right-sided abdominal pain. Evaluate for appendicitis.  EXAM: CT ABDOMEN AND PELVIS WITH CONTRAST  TECHNIQUE: Multidetector CT imaging of the abdomen and pelvis was performed using the standard protocol following bolus administration of intravenous contrast.  CONTRAST:  33m OMNIPAQUE IOHEXOL 300 MG/ML SOLN, 1018mOMNIPAQUE IOHEXOL 300 MG/ML SOLN  COMPARISON:  None.  FINDINGS:  BODY WALL: Unremarkable.  LOWER CHEST: There is partly imaged airspace disease in the right middle lobe.  ABDOMEN/PELVIS:  Liver: No focal abnormality.  Biliary: No evidence of biliary obstruction or stone.  Pancreas: Unremarkable.  Spleen: Unremarkable.  Adrenals: Unremarkable.  Kidneys and ureters: No hydronephrosis or stone.  Bladder: Unremarkable.  Reproductive: Unremarkable.  Bowel: There is appendicolith at the base of the appendix, with the appendix mildly dilated by fluid, up to 11 mm outer wall diameter. There is local peritoneal fluid and fat stranding in the right lower quadrant. No evidence of perforation or abscess. Retroperitoneum: No mass or adenopathy.  Peritoneum: No free fluid or gas.  Vascular: No acute abnormality.  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  Critical Value/emergent results were called by telephone at the time of interpretation on 10/23/2013 at 10:59 PM to Dr. STVirgel Manifold who verbally acknowledged these results.  IMPRESSION: 1. Acute, non perforated appendicitis. 2. Right middle lobe airspace disease has the appearance of pneumonia. Correlate with respiratory symptoms and followup to clearing.   Electronically Signed   By: JoJorje Guild.D.   On: 10/23/2013 23:02    Review of Systems  Constitutional: Negative.   HENT: Negative.   Eyes: Negative.   Respiratory: Negative.   Cardiovascular: Negative.   Gastrointestinal: Positive for nausea and abdominal pain.  Genitourinary: Negative.   Musculoskeletal: Negative.   Skin: Negative.   Neurological: Negative.   Endo/Heme/Allergies: Negative.   Psychiatric/Behavioral: Negative.     Blood pressure 156/99, pulse 96, temperature 100.1 F (37.8 C), temperature source Oral, resp. rate 18, height 6' 1"  (1.854 m), weight 245 lb (111.131 kg), SpO2 97.00%. Physical Exam  Constitutional: He is oriented to person, place, and time. He  appears well-developed and well-nourished.  HENT:  Head:  Normocephalic and atraumatic.  Eyes: Conjunctivae and EOM are normal. Pupils are equal, round, and reactive to light.  Neck: Normal range of motion. Neck supple.  Cardiovascular: Normal rate, regular rhythm and normal heart sounds.   Respiratory: Effort normal and breath sounds normal.  GI: Soft. Bowel sounds are normal.  Focal RLQ tenderness but no guarding or peritonitis  Musculoskeletal: Normal range of motion.  Neurological: He is alert and oriented to person, place, and time.  Skin: Skin is warm and dry.  Psychiatric: He has a normal mood and affect. His behavior is normal.     Assessment/Plan The pt appears to have acute appendicitis. Because of the risk of perforation and sepsis I think he would benefit from having his appendix removed. i have discussed with him the risks and benefits of surgery as well as some of the technical aspects and he understands and wishes to proceed. Plan for lap appy tonight  Luella Cook III 10/23/2013, 11:38 PM

## 2013-10-23 NOTE — ED Notes (Signed)
Pt states started having sharp R sided abdominal pain around 11 am, states having nausea and dry heives, and 2 diarrhea episodes.

## 2013-10-23 NOTE — ED Provider Notes (Signed)
CSN: [id number]     Arrival date & time 10/23/13  1922 History   First MD Initiated Contact with Patient 10/23/13 2112     Chief Complaint  Patient presents with  . Abdominal Pain    right     (Consider location/radiation/quality/duration/timing/severity/associated sxs/prior Treatment) HPI  38 year old male with abdominal pain. Right lower quadrant. Gradual onset around 11 AM this morning progressively worsening. The pain does not radiate. It is constant. Associated with nausea and dry heaving. No urinary complaints. No fevers or chills. Never had a pain like this before. No history of any previous abdominal surgeries. No blood thinning medication.  Past Medical History  Diagnosis Date  . Hypertension   . Allergy    History reviewed. No pertinent past surgical history. Family History  Problem Relation Age of Onset  . Asthma Son    History  Substance Use Topics  . Smoking status: Never Smoker   . Smokeless tobacco: Never Used  . Alcohol Use: Yes    Review of Systems  All systems reviewed and negative, other than as noted in HPI.   Allergies  Review of patient's allergies indicates no known allergies.  Home Medications   Prior to Admission medications   Medication Sig Start Date End Date Taking? Authorizing Provider  amLODipine (NORVASC) 5 MG tablet Take 1 tablet (5 mg total) by mouth daily. 10/15/13  Yes Elvina Sidle, MD  amoxicillin (AMOXIL) 875 MG tablet Take 875 mg by mouth 2 (two) times daily. For 10 days. 10/15/13  Yes Elvina Sidle, MD  HYDROcodone-homatropine [facility]) 5-1.5 MG/5ML syrup Take 5 mLs by mouth every 8 (eight) hours as needed for cough. 10/15/13  Yes Elvina Sidle, MD  predniSONE (DELTASONE) 20 MG tablet 2 daily with food 10/15/13   Elvina Sidle, MD   BP 156/99  Pulse 96  Temp(Src) 100.1 F (37.8 C) (Oral)  Resp 18  Ht 6\' 1"  (1.854 m)  Wt 245 lb (111.131 kg)  BMI 32.33 kg/m2  SpO2 97% Physical Exam  Nursing note and vitals  reviewed. Constitutional: He appears well-developed and well-nourished. No distress.  HENT:  Head: Normocephalic and atraumatic.  Eyes: Conjunctivae are normal. Right eye exhibits no discharge. Left eye exhibits no discharge.  Neck: Neck supple.  Cardiovascular: Normal rate, regular rhythm and normal heart sounds.  Exam reveals no gallop and no friction rub.   No murmur heard. Pulmonary/Chest: Effort normal and breath sounds normal. No respiratory distress.  Abdominal: Soft. He exhibits no distension. There is no tenderness.  Right lower quadrant tenderness with voluntary guarding. No rebound tenderness. No distention.  Musculoskeletal: He exhibits no edema and no tenderness.  Neurological: He is alert.  Skin: Skin is warm and dry.  Psychiatric: He has a normal mood and affect. His behavior is normal. Thought content normal.    ED Course  Procedures (including critical care time) Labs Review Labs Reviewed  CBC WITH DIFFERENTIAL - Abnormal; Notable for the following:    WBC 14.9 (*)    Neutro Abs 11.1 (*)    All other components within normal limits  COMPREHENSIVE METABOLIC PANEL - Abnormal; Notable for the following:    Glucose, Bld 126 (*)    Alkaline Phosphatase 120 (*)    GFR calc non Af Amer 88 (*)    All other components within normal limits  LIPASE, BLOOD  URINALYSIS, ROUTINE W REFLEX MICROSCOPIC    Imaging Review Ct Abdomen Pelvis W Contrast  10/23/2013   CLINICAL DATA:  Sharp right-sided abdominal pain.  Evaluate for appendicitis.  EXAM: CT ABDOMEN AND PELVIS WITH CONTRAST  TECHNIQUE: Multidetector CT imaging of the abdomen and pelvis was performed using the standard protocol following bolus administration of intravenous contrast.  CONTRAST:  50mL OMNIPAQUE IOHEXOL 300 MG/ML SOLN, 100mL OMNIPAQUE IOHEXOL 300 MG/ML SOLN  COMPARISON:  None.  FINDINGS: BODY WALL: Unremarkable.  LOWER CHEST: There is partly imaged airspace disease in the right middle lobe.  ABDOMEN/PELVIS:   Liver: No focal abnormality.  Biliary: No evidence of biliary obstruction or stone.  Pancreas: Unremarkable.  Spleen: Unremarkable.  Adrenals: Unremarkable.  Kidneys and ureters: No hydronephrosis or stone.  Bladder: Unremarkable.  Reproductive: Unremarkable.  Bowel: There is appendicolith at the base of the appendix, with the appendix mildly dilated by fluid, up to 11 mm outer wall diameter. There is local peritoneal fluid and fat stranding in the right lower quadrant. No evidence of perforation or abscess. Retroperitoneum: No mass or adenopathy.  Peritoneum: No free fluid or gas.  Vascular: No acute abnormality.  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  Critical Value/emergent results were called by telephone at the time of interpretation on 10/23/2013 at 10:59 PM to Dr. Raeford RazorSTEPHEN Lanaysia Fritchman , who verbally acknowledged these results.  IMPRESSION: 1. Acute, non perforated appendicitis. 2. Right middle lobe airspace disease has the appearance of pneumonia. Correlate with respiratory symptoms and followup to clearing.   Electronically Signed   By: Tiburcio PeaJonathan  Watts M.D.   On: 10/23/2013 23:02     EKG Interpretation None      MDM   Final diagnoses:  Acute appendicitis    [id number] PM 38 year old male with right lower quadrant pain. Suspect appendicitis. Patient is tender at McBurney's point. Low-grade temperature. Leukocytosis. Will check basic labs. Establish an IV. Pain medication. Urinalysis. CT of abdomen pelvis.  11:06 PM Discussed with radiology. Does have appendicitis. Will XR to further eval R middle lobe opacity. Pt with no respiratory complaints though. Surgery paged.   Raeford RazorStephen Merlie Noga, MD 10/23/13 [phone]

## 2013-10-24 ENCOUNTER — Observation Stay (HOSPITAL_COMMUNITY): Payer: BC Managed Care – PPO | Admitting: Registered Nurse

## 2013-10-24 ENCOUNTER — Encounter (HOSPITAL_COMMUNITY): Payer: BC Managed Care – PPO | Admitting: Registered Nurse

## 2013-10-24 ENCOUNTER — Encounter (HOSPITAL_COMMUNITY): Payer: Self-pay | Admitting: Registered Nurse

## 2013-10-24 ENCOUNTER — Encounter (HOSPITAL_COMMUNITY)
Admission: EM | Disposition: A | Discharge status: Home / Self Care | Payer: Self-pay | Source: Home / Self Care | Attending: Emergency Medicine

## 2013-10-24 DIAGNOSIS — K3580 Unspecified acute appendicitis: Secondary | ICD-10-CM

## 2013-10-24 HISTORY — PX: LAPAROSCOPIC APPENDECTOMY: SHX408

## 2013-10-24 LAB — SURGICAL PCR SCREEN
MRSA, PCR: NEGATIVE
STAPHYLOCOCCUS AUREUS: NEGATIVE

## 2013-10-24 SURGERY — APPENDECTOMY, LAPAROSCOPIC
Anesthesia: General

## 2013-10-24 MED ORDER — PIPERACILLIN-TAZOBACTAM 3.375 G IVPB: 3.3750 g | Freq: Three times a day (TID) | INTRAVENOUS | Status: DC

## 2013-10-24 MED ORDER — LABETALOL HCL 5 MG/ML IV SOLN
INTRAVENOUS | Status: AC
  Filled 2013-10-24: qty 4

## 2013-10-24 MED ORDER — AMLODIPINE BESYLATE 5 MG PO TABS
5.0000 mg | ORAL_TABLET | Freq: Every day | ORAL | Status: DC
  Administered 2013-10-24 – 2013-10-25 (×2): 5 mg via ORAL
  Filled 2013-10-24 (×3): qty 1

## 2013-10-24 MED ORDER — PROPOFOL 10 MG/ML IV BOLUS
INTRAVENOUS | Status: AC
  Filled 2013-10-24: qty 20

## 2013-10-24 MED ORDER — HYDROCODONE-ACETAMINOPHEN 5-325 MG PO TABS
1.0000 | ORAL_TABLET | ORAL | Status: DC | PRN
  Administered 2013-10-24 – 2013-10-25 (×2): 2 via ORAL
  Filled 2013-10-24 (×2): qty 2

## 2013-10-24 MED ORDER — PROMETHAZINE HCL 25 MG/ML IJ SOLN: 6.2500 mg | INTRAMUSCULAR | Status: DC | PRN

## 2013-10-24 MED ORDER — FENTANYL CITRATE 0.05 MG/ML IJ SOLN
INTRAMUSCULAR | Status: AC
  Filled 2013-10-24: qty 5

## 2013-10-24 MED ORDER — PIPERACILLIN-TAZOBACTAM 3.375 G IVPB
3.3750 g | Freq: Three times a day (TID) | INTRAVENOUS | Status: DC
  Administered 2013-10-24 – 2013-10-25 (×4): 3.375 g via INTRAVENOUS
  Filled 2013-10-24 (×6): qty 50

## 2013-10-24 MED ORDER — BUPIVACAINE HCL 0.25 % IJ SOLN
INTRAMUSCULAR | Status: DC | PRN
  Administered 2013-10-24: 42 mL

## 2013-10-24 MED ORDER — DEXAMETHASONE SODIUM PHOSPHATE 10 MG/ML IJ SOLN
INTRAMUSCULAR | Status: AC
  Filled 2013-10-24: qty 1

## 2013-10-24 MED ORDER — LACTATED RINGERS IV SOLN
INTRAVENOUS | Status: DC | PRN
  Administered 2013-10-24: 10:00:00 via INTRAVENOUS

## 2013-10-24 MED ORDER — FENTANYL CITRATE 0.05 MG/ML IJ SOLN
INTRAMUSCULAR | Status: DC | PRN
  Administered 2013-10-24 (×3): 50 ug via INTRAVENOUS

## 2013-10-24 MED ORDER — PROPOFOL 10 MG/ML IV BOLUS
INTRAVENOUS | Status: DC | PRN
  Administered 2013-10-24: 300 mg via INTRAVENOUS

## 2013-10-24 MED ORDER — METOPROLOL TARTRATE 1 MG/ML IV SOLN
2.0000 mg | Freq: Four times a day (QID) | INTRAVENOUS | Status: DC | PRN
  Administered 2013-10-24 (×2): 2 mg via INTRAVENOUS
  Filled 2013-10-24: qty 5

## 2013-10-24 MED ORDER — NEOSTIGMINE METHYLSULFATE 10 MG/10ML IV SOLN
INTRAVENOUS | Status: DC | PRN
  Administered 2013-10-24: 5 mg via INTRAVENOUS

## 2013-10-24 MED ORDER — ONDANSETRON HCL 4 MG/2ML IJ SOLN
INTRAMUSCULAR | Status: AC
  Filled 2013-10-24: qty 2

## 2013-10-24 MED ORDER — LABETALOL HCL 5 MG/ML IV SOLN
INTRAVENOUS | Status: DC | PRN
  Administered 2013-10-24 (×3): 5 mg via INTRAVENOUS

## 2013-10-24 MED ORDER — ONDANSETRON HCL 4 MG/2ML IJ SOLN: 4.0000 mg | Freq: Four times a day (QID) | INTRAMUSCULAR | Status: DC | PRN

## 2013-10-24 MED ORDER — PANTOPRAZOLE SODIUM 40 MG IV SOLR
40.0000 mg | Freq: Every day | INTRAVENOUS | Status: DC
  Administered 2013-10-24: 40 mg via INTRAVENOUS
  Filled 2013-10-24 (×2): qty 40

## 2013-10-24 MED ORDER — HYDROCODONE-ACETAMINOPHEN 5-325 MG PO TABS: 1.0000 | ORAL_TABLET | Freq: Four times a day (QID) | ORAL | Status: DC | PRN

## 2013-10-24 MED ORDER — SUCCINYLCHOLINE CHLORIDE 20 MG/ML IJ SOLN
INTRAMUSCULAR | Status: DC | PRN
  Administered 2013-10-24: 50 mg via INTRAVENOUS
  Administered 2013-10-24: 100 mg via INTRAVENOUS

## 2013-10-24 MED ORDER — MORPHINE SULFATE 4 MG/ML IJ SOLN
4.0000 mg | INTRAMUSCULAR | Status: DC | PRN
  Administered 2013-10-24 (×4): 4 mg via INTRAVENOUS
  Filled 2013-10-24 (×5): qty 1

## 2013-10-24 MED ORDER — ROCURONIUM BROMIDE 100 MG/10ML IV SOLN
INTRAVENOUS | Status: DC | PRN
  Administered 2013-10-24: 10 mg via INTRAVENOUS
  Administered 2013-10-24: 30 mg via INTRAVENOUS

## 2013-10-24 MED ORDER — LIDOCAINE HCL (CARDIAC) 20 MG/ML IV SOLN
INTRAVENOUS | Status: AC
  Filled 2013-10-24: qty 5

## 2013-10-24 MED ORDER — ONDANSETRON HCL 4 MG/2ML IJ SOLN
INTRAMUSCULAR | Status: DC | PRN
  Administered 2013-10-24: 4 mg via INTRAVENOUS

## 2013-10-24 MED ORDER — KETOROLAC TROMETHAMINE 30 MG/ML IJ SOLN: 15.0000 mg | Freq: Once | INTRAMUSCULAR | Status: DC | PRN

## 2013-10-24 MED ORDER — GLYCOPYRROLATE 0.2 MG/ML IJ SOLN
INTRAMUSCULAR | Status: DC | PRN
  Administered 2013-10-24: .8 mg via INTRAVENOUS

## 2013-10-24 MED ORDER — BUPIVACAINE HCL 0.25 % IJ SOLN
INTRAMUSCULAR | Status: AC
  Filled 2013-10-24: qty 1

## 2013-10-24 MED ORDER — HYDROMORPHONE HCL PF 1 MG/ML IJ SOLN: 0.2500 mg | INTRAMUSCULAR | Status: DC | PRN

## 2013-10-24 MED ORDER — POTASSIUM CHLORIDE IN NACL 20-0.9 MEQ/L-% IV SOLN
INTRAVENOUS | Status: DC
  Administered 2013-10-24 (×2): via INTRAVENOUS
  Filled 2013-10-24 (×6): qty 1000

## 2013-10-24 MED ORDER — DEXAMETHASONE SODIUM PHOSPHATE 10 MG/ML IJ SOLN
INTRAMUSCULAR | Status: DC | PRN
Start: 2013-10-24 — End: 2013-10-24
  Administered 2013-10-24: 10 mg via INTRAVENOUS

## 2013-10-24 MED ORDER — MIDAZOLAM HCL 5 MG/5ML IJ SOLN
INTRAMUSCULAR | Status: DC | PRN
Start: 2013-10-24 — End: 2013-10-24
  Administered 2013-10-24: 2 mg via INTRAVENOUS

## 2013-10-24 MED ORDER — GLYCOPYRROLATE 0.2 MG/ML IJ SOLN
INTRAMUSCULAR | Status: AC
  Filled 2013-10-24: qty 4

## 2013-10-24 MED ORDER — LIDOCAINE HCL (CARDIAC) 20 MG/ML IV SOLN
INTRAVENOUS | Status: DC | PRN
  Administered 2013-10-24: 100 mg via INTRAVENOUS

## 2013-10-24 MED ORDER — ROCURONIUM BROMIDE 100 MG/10ML IV SOLN
INTRAVENOUS | Status: AC
  Filled 2013-10-24: qty 1

## 2013-10-24 MED ORDER — MIDAZOLAM HCL 2 MG/2ML IJ SOLN
INTRAMUSCULAR | Status: AC
  Filled 2013-10-24: qty 2

## 2013-10-24 SURGICAL SUPPLY — 40 items
BENZOIN TINCTURE PRP APPL 2/3 (GAUZE/BANDAGES/DRESSINGS) ×4 IMPLANT
BLADE EXTENDED COATED 6.5IN (ELECTRODE) ×4 IMPLANT
BLADE HEX COATED 2.75 (ELECTRODE) ×4 IMPLANT
CANISTER SUCTION 2500CC (MISCELLANEOUS) ×4 IMPLANT
CLOSURE WOUND 1/2 X4 (GAUZE/BANDAGES/DRESSINGS) ×1
CUTTER FLEX LINEAR 45M (STAPLE) ×4 IMPLANT
DERMABOND ADVANCED (GAUZE/BANDAGES/DRESSINGS) ×2
DERMABOND ADVANCED .7 DNX12 (GAUZE/BANDAGES/DRESSINGS) ×2 IMPLANT
DISSECTOR ROUND CHERRY 3/8 STR (MISCELLANEOUS) IMPLANT
DRAPE LAPAROTOMY TRNSV 102X78 (DRAPE) ×4 IMPLANT
ELECT REM PT RETURN 9FT ADLT (ELECTROSURGICAL) ×4
ELECTRODE REM PT RTRN 9FT ADLT (ELECTROSURGICAL) ×2 IMPLANT
EVACUATOR DRAINAGE 10X20 100CC (DRAIN) ×2 IMPLANT
EVACUATOR SILICONE 100CC (DRAIN) ×2
GAUZE SPONGE 4X4 16PLY XRAY LF (GAUZE/BANDAGES/DRESSINGS) ×4 IMPLANT
GLOVE BIOGEL PI IND STRL 7.0 (GLOVE) ×2 IMPLANT
GLOVE BIOGEL PI INDICATOR 7.0 (GLOVE) ×2
GLOVE SURG SIGNA 7.5 PF LTX (GLOVE) ×4 IMPLANT
GOWN SPEC L4 XLG W/TWL (GOWN DISPOSABLE) ×8 IMPLANT
GOWN STRL REUS W/TWL LRG LVL3 (GOWN DISPOSABLE) ×4 IMPLANT
KIT BASIN OR (CUSTOM PROCEDURE TRAY) ×4 IMPLANT
NS IRRIG 1000ML POUR BTL (IV SOLUTION) ×4 IMPLANT
PACK GENERAL/GYN (CUSTOM PROCEDURE TRAY) ×4 IMPLANT
POUCH RETRIEVAL ECOSAC 10 (ENDOMECHANICALS) ×2 IMPLANT
POUCH RETRIEVAL ECOSAC 10MM (ENDOMECHANICALS) ×2
POUCH SPECIMEN RETRIEVAL 10MM (ENDOMECHANICALS) ×4 IMPLANT
RELOAD STAPLE TA45 3.5 REG BLU (ENDOMECHANICALS) ×4 IMPLANT
SPONGE GAUZE 4X4 12PLY (GAUZE/BANDAGES/DRESSINGS) ×4 IMPLANT
SPONGE LAP 4X18 X RAY DECT (DISPOSABLE) ×4 IMPLANT
STAPLER VISISTAT 35W (STAPLE) IMPLANT
STRIP CLOSURE SKIN 1/2X4 (GAUZE/BANDAGES/DRESSINGS) ×3 IMPLANT
SUCTION POOLE TIP (SUCTIONS) IMPLANT
SUT SILK 3 0 (SUTURE) ×2
SUT SILK 3 0 SH 30 (SUTURE) ×4 IMPLANT
SUT SILK 3-0 18XBRD TIE 12 (SUTURE) ×2 IMPLANT
SUT VIC AB 1 CT1 27 (SUTURE) ×6
SUT VIC AB 1 CT1 27XBRD ANTBC (SUTURE) ×6 IMPLANT
TOWEL OR 17X26 10 PK STRL BLUE (TOWEL DISPOSABLE) ×4 IMPLANT
TRAY FOLEY CATH 14FRSI W/METER (CATHETERS) IMPLANT
WATER STERILE IRR 1500ML POUR (IV SOLUTION) ×4 IMPLANT

## 2013-10-24 NOTE — Discharge Instructions (Signed)
[facility] ______[facility] °LAPAROSCOPIC SURGERY: POST OP INSTRUCTIONS °Always review your discharge instruction sheet given to you by the facility where your surgery was performed. °IF YOU HAVE DISABILITY OR FAMILY LEAVE FORMS, YOU MUST BRING THEM TO THE OFFICE FOR PROCESSING.   °DO NOT GIVE THEM TO YOUR DOCTOR. ° °1. A prescription for pain medication may be given to you upon discharge.  Take your pain medication as prescribed, if needed.  If narcotic pain medicine is not needed, then you may take acetaminophen (Tylenol) or ibuprofen (Advil) as needed. °2. Take your usually prescribed medications unless otherwise directed. °3. If you need a refill on your pain medication, please contact your pharmacy.  They will contact our office to request authorization. Prescriptions will not be filled after 5pm or on week-ends. °4. You should follow a light diet the first few days after arrival home, such as soup and crackers, etc.  Be sure to include lots of fluids daily. °5. Most patients will experience some swelling and bruising in the area of the incisions.  Ice packs will help.  Swelling and bruising can take several days to resolve.  °6. It is common to experience some constipation if taking pain medication after surgery.  Increasing fluid intake and taking a stool softener (such as Colace) will usually help or prevent this problem from occurring.  A mild laxative (Milk of Magnesia or Miralax) should be taken according to package instructions if there are no bowel movements after 48 hours. °7. Unless discharge instructions indicate otherwise, you may remove your bandages 24-48 hours after surgery, and you may shower at that time.  You may have steri-strips (small skin tapes) in place directly over the incision.  These strips should be left on the skin for 7-10 days.  If your surgeon used skin glue on the incision, you may shower in 24 hours.  The glue will flake off over the next 2-3 weeks.  Any sutures or  staples will be removed at the office during your follow-up visit. °8. ACTIVITIES:  You may resume regular (light) daily activities beginning the next day--such as daily self-care, walking, climbing stairs--gradually increasing activities as tolerated.  You may have sexual intercourse when it is comfortable.  Refrain from any heavy lifting or straining until approved by your doctor. °a. You may drive when you are no longer taking prescription pain medication, you can comfortably wear a seatbelt, and you can safely maneuver your car and apply brakes. °b. RETURN TO WORK:  __________________________________________________________ °9. You should see your doctor in the office for a follow-up appointment approximately 2-3 weeks after your surgery.  Make sure that you call for this appointment within a day or two after you arrive home to insure a convenient appointment time. °10. OTHER INSTRUCTIONS: __________________________________________________________________________________________________________________________ __________________________________________________________________________________________________________________________ °WHEN TO CALL YOUR DOCTOR: °1. Fever over 101.0 °2. Inability to urinate °3. Continued bleeding from incision. °4. Increased pain, redness, or drainage from the incision. °5. Increasing abdominal pain ° °The clinic staff is available to answer your questions during regular business hours.  Please don’t hesitate to call and ask to speak to one of the nurses for clinical concerns.  If you have a medical emergency, go to the nearest emergency room or call 911.  A surgeon from [facility] is always on call at the hospital. °[address], [address], [place], [place]  [address] ? [address] [address], [place], [place]   [address] °[phone] ? [phone] ? FAX [phone] °Web site:   [email or web address] °

## 2013-10-24 NOTE — Anesthesia Preprocedure Evaluation (Addendum)
Anesthesia Evaluation  Patient identified by MRN, date of birth, ID band Patient awake    Reviewed: Allergy & Precautions, H&P , NPO status , Patient's Chart, lab work & pertinent test results  Airway Mallampati: II TM Distance: >3 FB Neck ROM: Full    Dental  (+) Poor Dentition, Loose, Chipped,    Pulmonary  ? Pneumonia on CXR breath sounds clear to auscultation  Pulmonary exam normal       Cardiovascular hypertension, Pt. on medications Rhythm:Regular Rate:Normal  Essentially untreated HTN   Neuro/Psych negative neurological ROS  negative psych ROS   GI/Hepatic negative GI ROS, Neg liver ROS,   Endo/Other  negative endocrine ROS  Renal/GU negative Renal ROS  negative genitourinary   Musculoskeletal negative musculoskeletal ROS (+)   Abdominal   Peds negative pediatric ROS (+)  Hematology negative hematology ROS (+)   Anesthesia Other Findings   Reproductive/Obstetrics negative OB ROS                        Anesthesia Physical Anesthesia Plan  ASA: III  Anesthesia Plan: General   Post-op Pain Management:    Induction: Intravenous  Airway Management Planned: Oral ETT  Additional Equipment:   Intra-op Plan:   Post-operative Plan: Extubation in OR  Informed Consent: I have reviewed the patients History and Physical, chart, labs and discussed the procedure including the risks, benefits and alternatives for the proposed anesthesia with the patient or authorized representative who has indicated his/her understanding and acceptance.   Dental advisory given  Plan Discussed with: CRNA and Surgeon  Anesthesia Plan Comments:        Anesthesia Quick Evaluation

## 2013-10-24 NOTE — Progress Notes (Signed)
Admitted last PM by Dr. Carolynne Edouard.  Has appendicitis, but does not look too sick.  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  Unmarried, but has son.   I discussed with the patient the indications and risks of appendiceal surgery.  The primary risks of appendiceal surgery include, but are not limited to, bleeding, infection, bowel surgery, and open surgery.  There is also the risk that the patient may have continued symptoms after surgery.  However, the likelihood of improvement in symptoms and return to the patient's normal status is good. We discussed the typical post-operative recovery course. I tried to answer the patient's questions.   Plan lap appendectomy later this AM.  Ovidio Kin, MD, [facility] Pager: [phone] Office phone:  [phone]

## 2013-10-24 NOTE — Discharge Summary (Signed)
Physician Discharge Summary  Patient ID: Jacob [name] MRN: [id number] DOB/AGE: 10/28/1975 38 y.o.  Admit date: 10/23/2013 Discharge date: 10/25/2013  Admitting Diagnosis: Acute appendicitis  Discharge Diagnosis Patient Active Problem List   Diagnosis Date Noted  . Acute appendicitis 10/23/2013    Consultants None   Imaging: Dg Chest 2 View  10/23/2013   CLINICAL DATA:  Upper abdominal pain.  Cough.  EXAM: CHEST  2 VIEW  COMPARISON:  None.  FINDINGS: Normal heart, mediastinum hila.  Clear lungs.  No pleural effusion.  No pneumothorax.  The bony thorax is intact.  IMPRESSION: No active cardiopulmonary disease.   Electronically Signed   By: Amie Portland M.D.   On: 10/23/2013 23:42   Ct Abdomen Pelvis W Contrast  10/23/2013   CLINICAL DATA:  Sharp right-sided abdominal pain. Evaluate for appendicitis.  EXAM: CT ABDOMEN AND PELVIS WITH CONTRAST  TECHNIQUE: Multidetector CT imaging of the abdomen and pelvis was performed using the standard protocol following bolus administration of intravenous contrast.  CONTRAST:  37mL OMNIPAQUE IOHEXOL 300 MG/ML SOLN, OMNIPAQUE IOHEXOL 300 MG/ML SOLN  COMPARISON:  None.  FINDINGS: BODY WALL: Unremarkable.  LOWER CHEST: There is partly imaged airspace disease in the right middle lobe.  ABDOMEN/PELVIS:  Liver: No focal abnormality.  Biliary: No evidence of biliary obstruction or stone.  Pancreas: Unremarkable.  Spleen: Unremarkable.  Adrenals: Unremarkable.  Kidneys and ureters: No hydronephrosis or stone.  Bladder: Unremarkable.  Reproductive: Unremarkable.  Bowel: There is appendicolith at the base of the appendix, with the appendix mildly dilated by fluid, up to 11 mm outer wall diameter. There is local peritoneal fluid and fat stranding in the right lower quadrant. No evidence of perforation or abscess. Retroperitoneum: No mass or adenopathy.  Peritoneum: No free fluid or gas.  Vascular: No acute abnormality.  OSSEOUS: SI joint osteoarthritis with bridging  osteophytes on the right.  Critical Value/emergent results were called by telephone at the time of interpretation on 10/23/2013 at 10:59 PM to Dr. Raeford Razor , who verbally acknowledged these results.  IMPRESSION: 1. Acute, non perforated appendicitis. 2. Right middle lobe airspace disease has the appearance of pneumonia. Correlate with respiratory symptoms and followup to clearing.   Electronically Signed   By: Tiburcio Pea M.D.   On: 10/23/2013 23:02    Procedures Dr. Ezzard Standing (10/23/13) - Laparoscopic Appendectomy  Hospital Course:  38 yo bm who began having RLQ pain this afternoon. He has had nausea and vomiting associated with it. He has had a small amount of diarrhea. Pain gradually worsened so he came to ER. CT shows appendicitis but no rupture.  Patient was admitted and underwent procedure listed above.  Tolerated procedure well and was transferred to the floor.  Diet was advanced as tolerated.  On POD #1, the patient was voiding well, tolerating diet, ambulating well, pain well controlled, vital signs stable, incisions c/d/i and felt stable for discharge home.  Patient will follow up in our office in 3 weeks and knows to call with questions or concerns.  Physical Exam: General:  Alert, NAD, pleasant, comfortable Abd:  Soft, ND, mild tenderness, incisions C/D/I, dermabond in place    Medication List    STOP taking these medications       HYDROcodone-homatropine 5-1.5 MG/5ML syrup  Commonly known as:  HYCODAN      TAKE these medications       amLODipine 5 MG tablet  Commonly known as:  NORVASC  Take 1 tablet (5 mg total) by mouth  daily.     amoxicillin 875 MG tablet  Commonly known as:  AMOXIL  Take 875 mg by mouth 2 (two) times daily. For 10 days.     HYDROcodone-acetaminophen 5-325 MG per tablet  Commonly known as:  NORCO/VICODIN  Take 1-2 tablets by mouth every 6 (six) hours as needed for moderate pain.     predniSONE 20 MG tablet  Commonly known as:  DELTASONE  2  daily with food         Follow-up Information   Follow up with [facility] On 11/07/2013. (For post-operation check. Your appointment is at 1:45pm, please arrive at least 30 min before your appointment to complete your check in paperwork.  If you are unable to arrive 30 min prior to your appointment time we may have to cancel or reschedule you)    Contact information:   [address]   [place] [id number] [phone]       Signed: Candiss NorseMegan Dort, PA-C [facility] [phone]  10/25/2013, 9:28 AM  Agree with above.  Ovidio Kinavid Faith Patricelli, MD, [facility] Pager: [phone] Office phone:  [phone]

## 2013-10-24 NOTE — Transfer of Care (Signed)
Immediate Anesthesia Transfer of Care Note  Patient: Jacob [name]  Procedure(s) Performed: Procedure(s): APPENDECTOMY LAPAROSCOPIC  Patient Location: PACU  Anesthesia Type:General  Level of Consciousness: awake, alert , oriented and patient cooperative  Airway & Oxygen Therapy: Patient Spontanous Breathing and Patient connected to face mask oxygen  Post-op Assessment: Report given to PACU RN, Post -op Vital signs reviewed and stable and Patient moving all extremities  Post vital signs: Reviewed and stable  Complications: No apparent anesthesia complications

## 2013-10-24 NOTE — Progress Notes (Signed)
UR completed 

## 2013-10-24 NOTE — Progress Notes (Signed)
ANTIBIOTIC CONSULT NOTE - INITIAL  Pharmacy Consult for zosyn Indication: appendix   No Known Allergies  Patient Measurements: Height: 6\' 1"  (185.4 cm) Weight: 245 lb (111.131 kg) IBW/kg (Calculated) : 79.9 Adjusted Body Weight:   Vital Signs: Temp: 100.2 F (37.9 C) (06/02 0413) Temp src: Oral (06/02 0413) BP: 170/116 mmHg (06/02 0423) Pulse Rate: 74 (06/02 0413) Intake/Output from previous day:   Intake/Output from this shift:    Labs:  Recent Labs  10/23/13 2006  WBC 14.9*  HGB 16.1  PLT 211  CREATININE 1.04   Estimated Creatinine Clearance: 123.4 ml/min (by C-G formula based on Cr of 1.04). No results found for this basename: VANCOTROUGH, VANCOPEAK, VANCORANDOM, GENTTROUGH, GENTPEAK, GENTRANDOM, TOBRATROUGH, TOBRAPEAK, TOBRARND, AMIKACINPEAK, AMIKACINTROU, AMIKACIN,  in the last 72 hours   Microbiology: No results found for this or any previous visit (from the past 720 hour(s)).  Medical History: Past Medical History  Diagnosis Date  . Hypertension   . Allergy     Medications:  Anti-infectives   Start     Dose/Rate Route Frequency Ordered Stop   10/24/13 0600  piperacillin-tazobactam (ZOSYN) IVPB 3.375 g     3.375 g 12.5 mL/hr over 240 Minutes Intravenous 3 times per day 10/24/13 0425     10/23/13 2300  piperacillin-tazobactam (ZOSYN) IVPB 3.375 g     3.375 g 100 mL/hr over 30 Minutes Intravenous  Once 10/23/13 2253 10/24/13 0023     Assessment: Patient with appendicitis.   Goal of Therapy:  Zosyn based on renal function   Plan:  Follow up culture results Zosyn 3.375g IV Q8H infused over 4hrs.   Jacob [name]. 10/24/2013,4:26 AM

## 2013-10-24 NOTE — Anesthesia Postprocedure Evaluation (Signed)
  Anesthesia Post-op Note  Patient: Jacob [name]  Procedure(s) Performed: Procedure(s): APPENDECTOMY LAPAROSCOPIC  Patient Location: PACU  Anesthesia Type: General  Level of Consciousness: awake and alert   Airway and Oxygen Therapy: Patient Spontanous Breathing  Post-op Pain: mild  Post-op Assessment: Post-op Vital signs reviewed, Patient's Cardiovascular Status Stable, Respiratory Function Stable, Patent Airway and No signs of Nausea or vomiting  Last Vitals:  Filed Vitals:   10/24/13 1200  BP: 151/98  Pulse: 79  Temp:   Resp: 19    Post-op Vital Signs: stable   Complications: No apparent anesthesia complications

## 2013-10-24 NOTE — Op Note (Signed)
Re:   Jacob [name] DOB:   14-Jan-1976 MRN:   [id number]                   FACILITY:  [facility]  DATE OF PROCEDURE: 10/24/2013                              OPERATIVE REPORT  PREOPERATIVE DIAGNOSIS:  Appendicitis  POSTOPERATIVE DIAGNOSIS:  Acute appendicitis.  Hypertension.  PROCEDURE:  Laparoscopic appendectomy.  SURGEON:  Sandria Bales. Ezzard Standing, MD  ASSISTANT:  No first assistant.  ANESTHESIA:  General endotracheal.  Anesthesiologist: Eilene Ghazi, MD CRNA: Elisabeth Cara, CRNA  ASA:  2  ESTIMATED BLOOD LOSS:  Minimal.  DRAINS: none   SPECIMEN:   Appendix  COUNTS CORRECT:  YES  INDICATIONS FOR PROCEDURE: Jacob [name] is a 38 y.o. (DOB: Aug 08, 1975) AA  male whose primary care doctor is No PCP Per Patient and comes to the OR for an appendectomy.   I discussed with the patient, the indications and potential complications of appendiceal surgery.  The potential complications include, but are not limited to, bleeding, open surgery, bowel resection, and the possibility of another diagnosis.  OPERATIVE NOTE:  The patient underwent a general endotracheal anesthetic as supervised by Anesthesiologist: Eilene Ghazi, MD CRNA: Elisabeth Cara, CRNA, in room #6.  The patient was on Zosyn prior to the procedure and the abdomen was prepped with ChloraPrep.  He did not have a foley catheter.  A time-out was held and surgical checklist run.  An infraumbilical incision was made with sharp dissection carried down to the abdominal cavity.  An 12 mm Hasson trocar was inserted through the infraumbilical incision and into the peritoneal cavity.  A 0 degree 10 mm laparoscope was inserted through a 12 mm Hasson trocar and the Hasson trocar secured with a 0 Vicryl suture.  I placed a 5 mm trocar in the right upper quadrant and 11 mm torcar in left lower quadrant and did abdominal exploration.    The right and left lobes of liver unremarkable.  Stomach was unremarkable.  The pelvic organs were unremarkable.  I  saw no other intra-abdominal abnormality.  The patient had appendicitis with the appendix located right colon gutter, above the pelvic brim.  The mesentery of the appendix was divided with a Harmonic scalpel.  I got to the base of the appendix.  I then used a blue load 45 mm Ethicon Endo-GIA stapler and fired this across the base of the appendix.  I placed the appendix in EndoCatch bag and delivered the bag through the umbilical incision.  I irrigated the abdomen with 700 cc of saline.  After irrigating the abdomen, I then removed the trocars, in turn.  The umbilical port fascia was closed with 0 Vicryl suture.   I closed the skin each site with a 5-0 Vicryl suture and painted the wounds with Dermabond.  I then injected a total of 25 mL of 0.25% Marcaine at the incisions.  Sponge and needle count were correct at the end of the case.   The patient was transferred to the recovery room in good condition.  The patient tolerated the procedure well and it depends on the patient's post op clinical course as to when the patient could be discharged.   Ovidio Kin, MD, [facility] Pager: [phone] Office phone:  [phone]

## 2013-10-25 NOTE — Progress Notes (Signed)
Patient is alert and oriented, vital signs are stable, incisions are within normal limits, patient is tolerating a regular diet without complaints of nausea or vomiting, discharge instructions reviewed with patient, patient to follow up with [facility], questions and concerns answered Stanford Breed RN 10-25-2013 10:13am

## 2013-10-26 ENCOUNTER — Encounter (HOSPITAL_COMMUNITY): Payer: Self-pay | Admitting: Emergency Medicine

## 2013-10-26 ENCOUNTER — Emergency Department (HOSPITAL_COMMUNITY): Payer: BC Managed Care – PPO

## 2013-10-26 ENCOUNTER — Emergency Department (HOSPITAL_COMMUNITY)
Admission: EM | Admit: 2013-10-26 | Discharge: 2013-10-26 | Disposition: A | Discharge status: Home / Self Care | Payer: BC Managed Care – PPO | Attending: Emergency Medicine | Admitting: Emergency Medicine

## 2013-10-26 DIAGNOSIS — Z9089 Acquired absence of other organs: Secondary | ICD-10-CM | POA: Insufficient documentation

## 2013-10-26 DIAGNOSIS — R109 Unspecified abdominal pain: Secondary | ICD-10-CM

## 2013-10-26 DIAGNOSIS — K5900 Constipation, unspecified: Secondary | ICD-10-CM | POA: Insufficient documentation

## 2013-10-26 DIAGNOSIS — Z79899 Other long term (current) drug therapy: Secondary | ICD-10-CM | POA: Insufficient documentation

## 2013-10-26 DIAGNOSIS — I10 Essential (primary) hypertension: Secondary | ICD-10-CM | POA: Insufficient documentation

## 2013-10-26 LAB — COMPREHENSIVE METABOLIC PANEL
ALT: 15 U/L (ref 0–53)
AST: 26 U/L (ref 0–37)
Albumin: 3.4 g/dL — ABNORMAL LOW (ref 3.5–5.2)
Alkaline Phosphatase: 102 U/L (ref 39–117)
BILIRUBIN TOTAL: 0.6 mg/dL (ref 0.3–1.2)
BUN: 13 mg/dL (ref 6–23)
CALCIUM: 9.5 mg/dL (ref 8.4–10.5)
CHLORIDE: 100 meq/L (ref 96–112)
CO2: 30 meq/L (ref 19–32)
CREATININE: 0.77 mg/dL (ref 0.50–1.35)
GLUCOSE: 148 mg/dL — AB (ref 70–99)
Potassium: 3.6 mEq/L — ABNORMAL LOW (ref 3.7–5.3)
Sodium: 141 mEq/L (ref 137–147)
Total Protein: 7.9 g/dL (ref 6.0–8.3)

## 2013-10-26 LAB — CBC WITH DIFFERENTIAL/PLATELET
Basophils Absolute: 0 10*3/uL (ref 0.0–0.1)
Basophils Relative: 0 % (ref 0–1)
Eosinophils Absolute: 0 10*3/uL (ref 0.0–0.7)
Eosinophils Relative: 0 % (ref 0–5)
HEMATOCRIT: 42.8 % (ref 39.0–52.0)
Hemoglobin: 15.2 g/dL (ref 13.0–17.0)
LYMPHS PCT: 28 % (ref 12–46)
Lymphs Abs: 2.8 10*3/uL (ref 0.7–4.0)
MCH: 29.8 pg (ref 26.0–34.0)
MCHC: 35.5 g/dL (ref 30.0–36.0)
MCV: 83.9 fL (ref 78.0–100.0)
MONOS PCT: 7 % (ref 3–12)
Monocytes Absolute: 0.7 10*3/uL (ref 0.1–1.0)
NEUTROS ABS: 6.6 10*3/uL (ref 1.7–7.7)
Neutrophils Relative %: 65 % (ref 43–77)
Platelets: 193 10*3/uL (ref 150–400)
RBC: 5.1 MIL/uL (ref 4.22–5.81)
RDW: 13.3 % (ref 11.5–15.5)
WBC: 10.2 10*3/uL (ref 4.0–10.5)

## 2013-10-26 IMAGING — CR DG ABDOMEN 2V
3 series · 3 of 3 positions shown · non-contrast
Comparison: Abdominal CT from 3 days per

CLINICAL DATA: Constipation 2 days after appendicitis resection

EXAM:
ABDOMEN - 2 VIEW

[w abdomen upright]
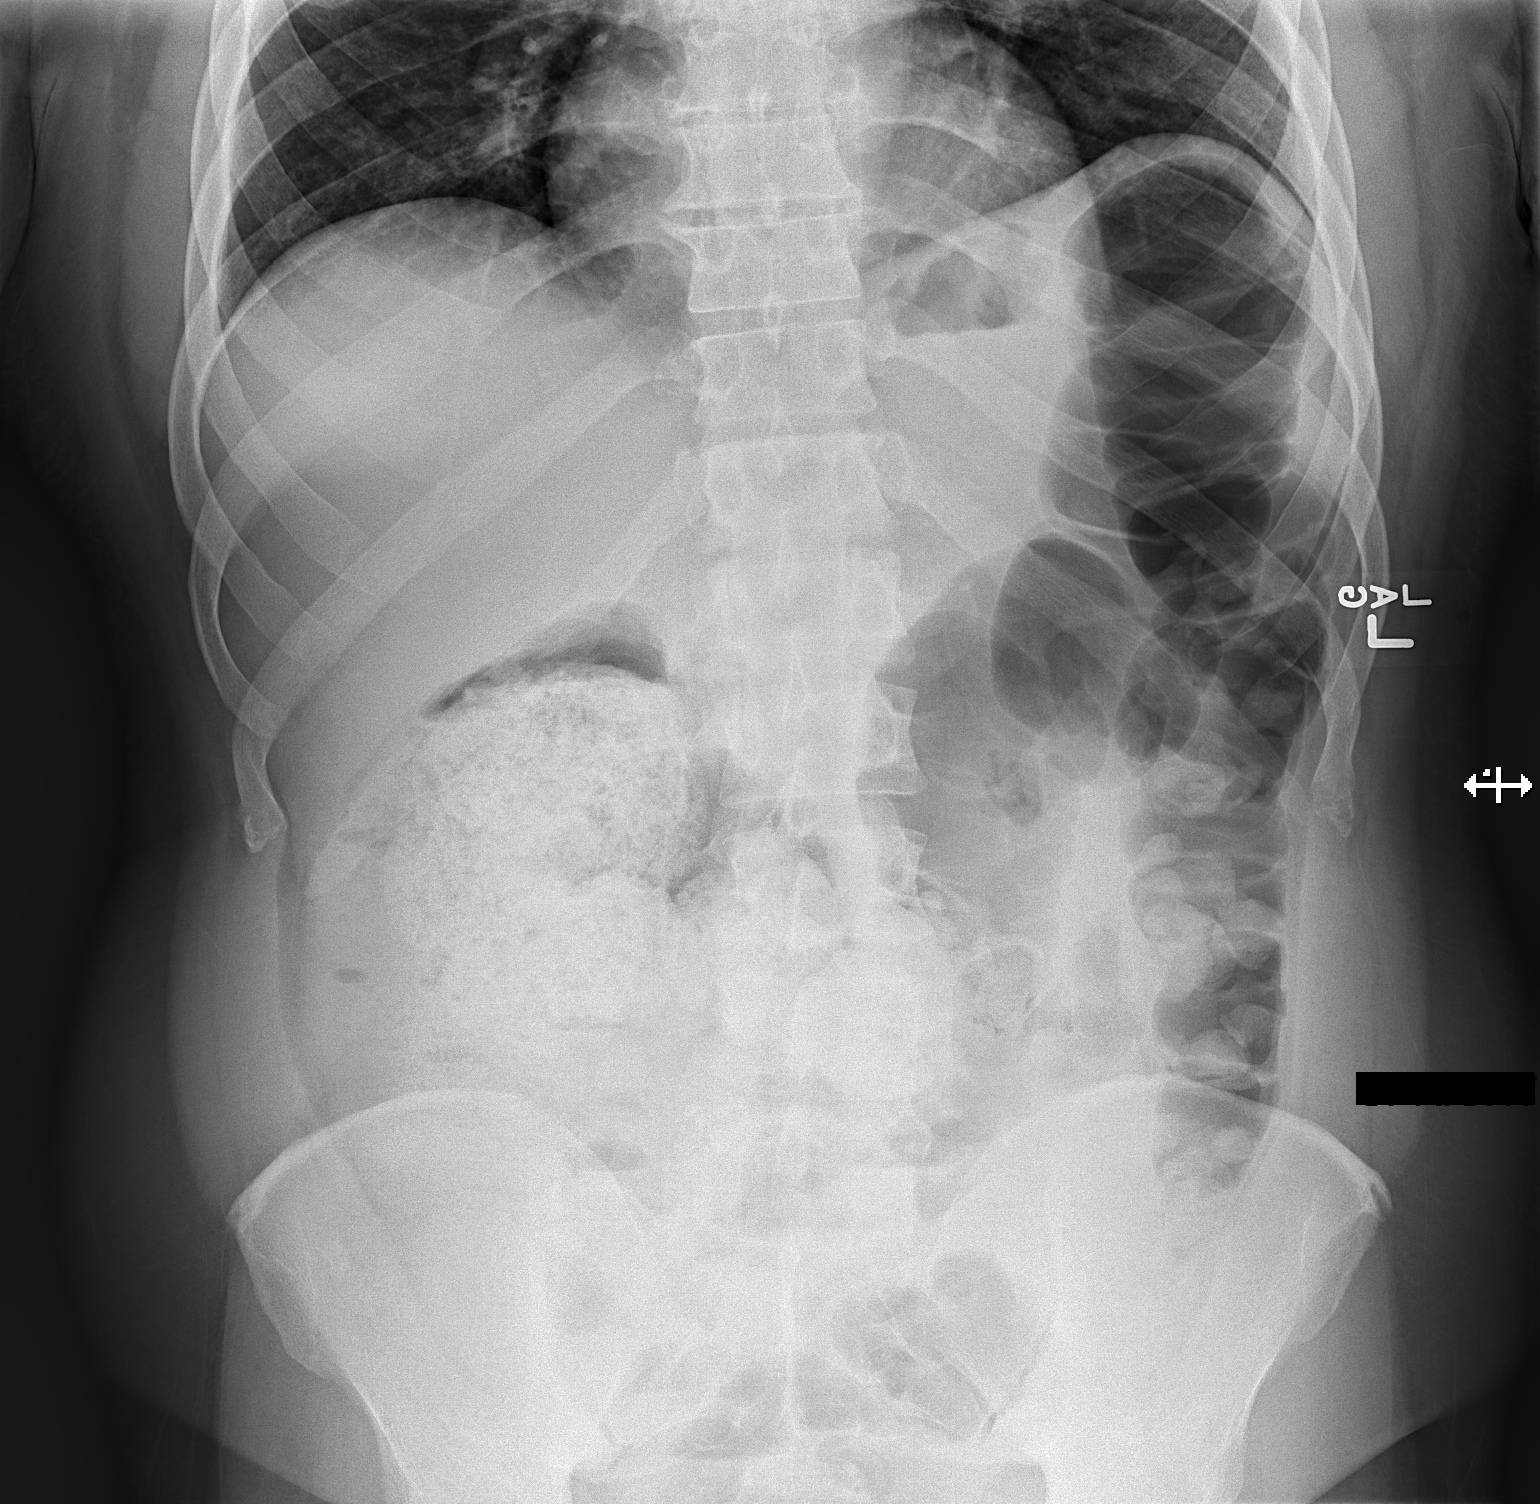

[t abdomen supine (1 of 2)]
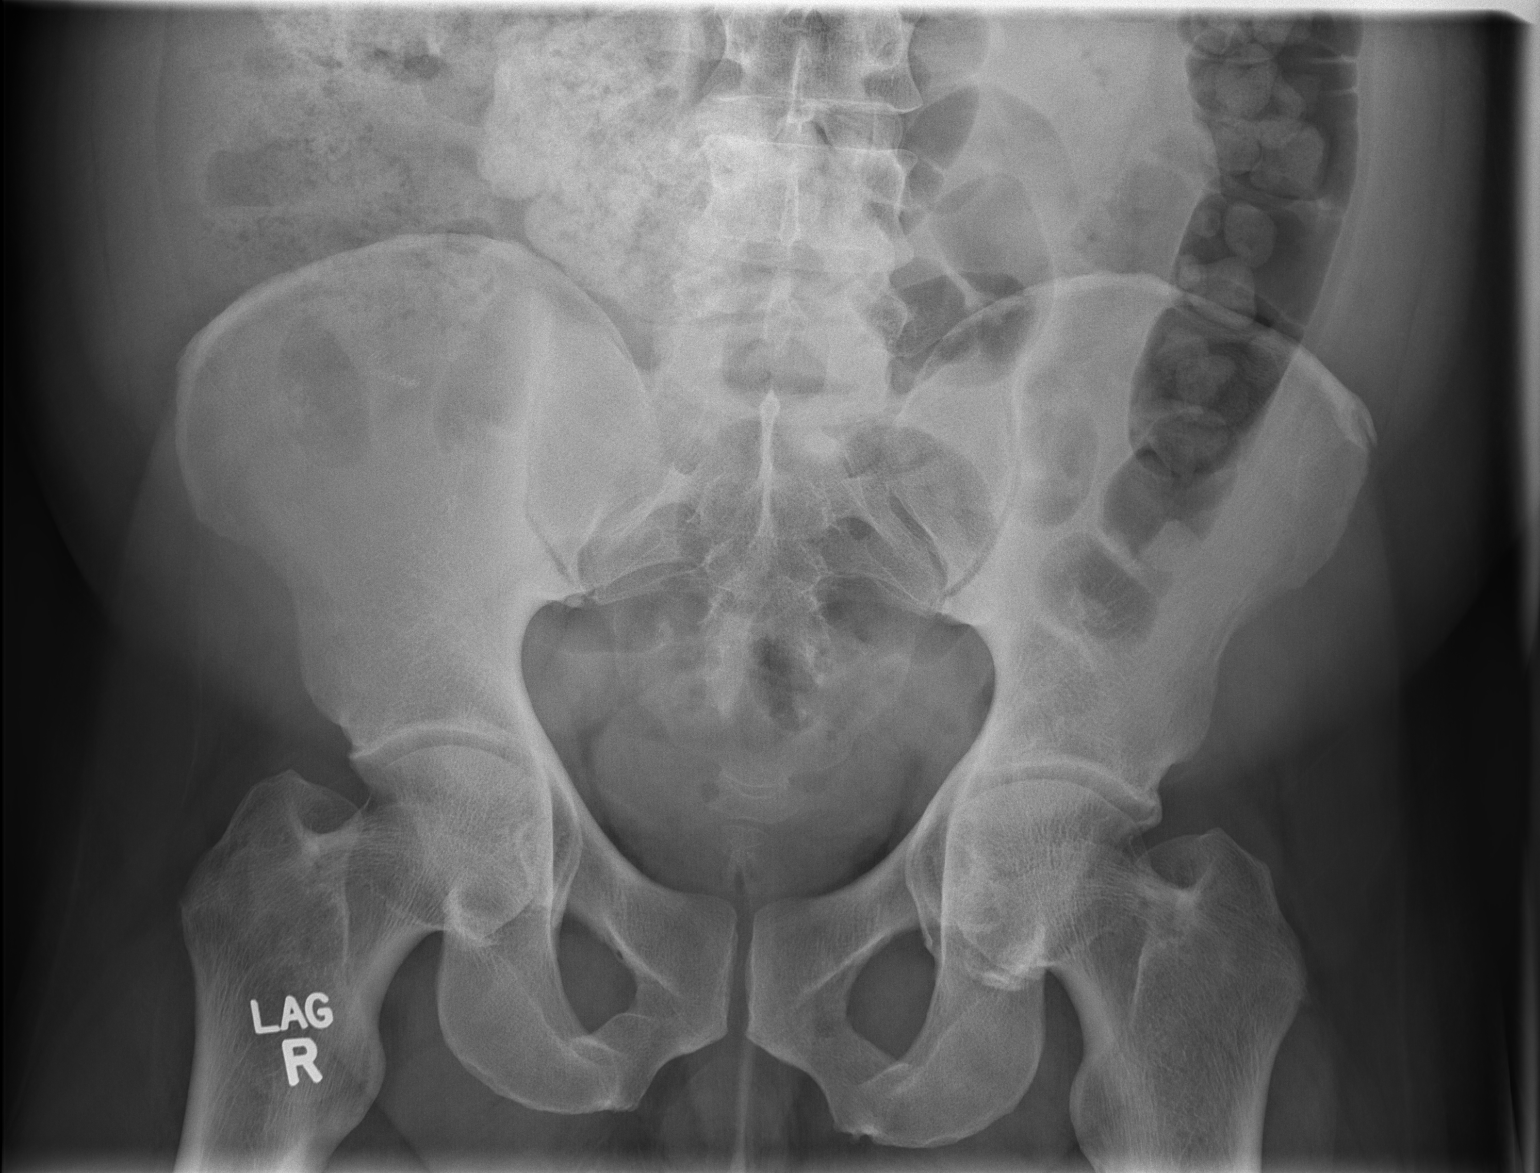

[t abdomen supine (2 of 2)]
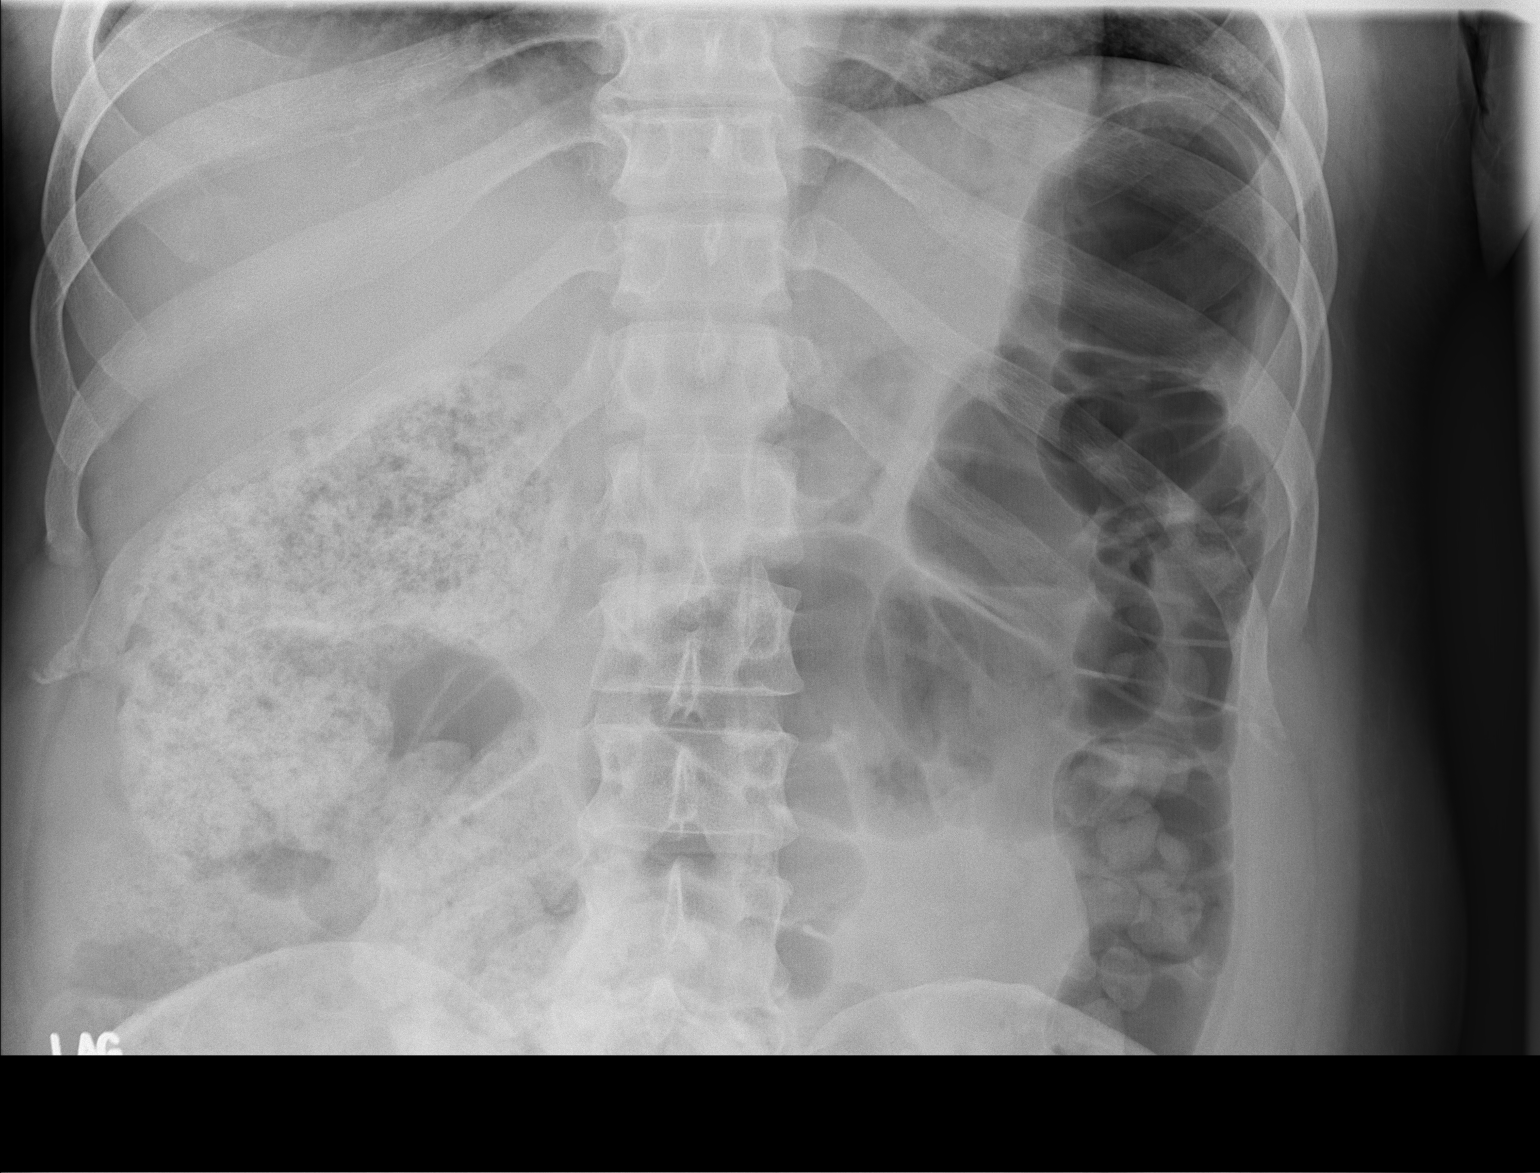

[3 of 3 positions shown; findings below may reference images not displayed]

FINDINGS: There is a moderate volume of stool in the proximal colon. Gaseous
distension of the more distal colon. No small bowel obstruction. No
pneumoperitoneum. Bowel suture in the right lower quadrant
correlating with recent appendectomy. The lung bases are clear.
IMPRESSION: Diffuse distention of the colon by gas and stool. No evidence of
obstruction.

## 2013-10-26 MED ORDER — POLYETHYLENE GLYCOL 3350 17 G PO PACK
17.0000 g | PACK | Freq: Every day | ORAL | Status: DC
  Filled 2013-10-26: qty 1

## 2013-10-26 MED ORDER — POLYETHYLENE GLYCOL 3350 17 GM/SCOOP PO POWD: 17.0000 g | Freq: Every day | ORAL | Status: DC

## 2013-10-26 MED ORDER — MILK AND MOLASSES ENEMA
1.0000 | Freq: Once | RECTAL | Status: AC
  Administered 2013-10-26: 250 mL via RECTAL
  Filled 2013-10-26: qty 250

## 2013-10-26 MED ORDER — GLYCERIN (LAXATIVE) 2.1 G RE SUPP
1.0000 | Freq: Once | RECTAL | Status: AC
  Administered 2013-10-26: 1 via RECTAL
  Filled 2013-10-26: qty 1

## 2013-10-26 MED ORDER — MORPHINE SULFATE 4 MG/ML IJ SOLN
4.0000 mg | Freq: Once | INTRAMUSCULAR | Status: AC
  Administered 2013-10-26: 4 mg via INTRAVENOUS
  Filled 2013-10-26: qty 1

## 2013-10-26 NOTE — ED Provider Notes (Signed)
Medical screening examination/treatment/procedure(s) were conducted as a shared visit with non-physician practitioner(s) and myself.  I personally evaluated the patient during the encounter.   EKG Interpretation None      Patient with abdominal pain status post surgery.  I suspect is mainly constipated.  The general surgery team will touch base with him here in the emergency department.  I suspect he will be safe for discharge home  6:46 AM Seen by Dr Derrell Lolling who agrees, pt is safe for discharge home. Pt with large bowel movement in ER and feeling better  Lyanne Co, MD 10/26/13 [phone]

## 2013-10-26 NOTE — ED Notes (Signed)
Pt presents with abd pain, appe yesterday at this facility. Denies n/v/d. Pt did take laxative with no results.

## 2013-10-26 NOTE — Discharge Instructions (Signed)

## 2013-10-26 NOTE — ED Provider Notes (Signed)
5:30 AM Spoke with Dr Derrell Lolling, [facility], who will evaluate in the ER  Lyanne Co, MD 10/26/13 [phone]

## 2013-10-26 NOTE — Consult Note (Signed)
Reason for Consult:abdominal pain/constipation Referring Physician: Dr. Quentin Ore is an 38 y.o. male.  HPI: the patient is a 38 year old male status post appendectomy 2 days ago. Subsequent to this patient states he's had no flatus and has had no bowel movement. He states he took some mag citrate at home which was unproductive. Upon evaluation in the ER the patient underwent KUBs which revealed large amount of stool in the colon as well as gas. He was given an enema and suppository which provoked a bowel movement.  Subsequent to this patient states that his abdominal pain was relieved, and feels this was the cause of his pain.  Past Medical History  Diagnosis Date  . Hypertension   . Allergy     Past Surgical History  Procedure Laterality Date  . Appendectomy      Family History  Problem Relation Age of Onset  . Asthma Son     Social History:  reports that he has never smoked. He has never used smokeless tobacco. He reports that he drinks alcohol. He reports that he does not use illicit drugs.  Allergies: No Known Allergies  Medications: I have reviewed the patient's current medications.  Results for orders placed during the hospital encounter of 10/26/13 (from the past 48 hour(s))  CBC WITH DIFFERENTIAL     Status: None   Collection Time    10/26/13  4:30 AM      Result Value Ref Range   WBC 10.2  4.0 - 10.5 K/uL   RBC 5.10  4.22 - 5.81 MIL/uL   Hemoglobin 15.2  13.0 - 17.0 g/dL   HCT 42.8  39.0 - 52.0 %   MCV 83.9  78.0 - 100.0 fL   MCH 29.8  26.0 - 34.0 pg   MCHC 35.5  30.0 - 36.0 g/dL   RDW 13.3  11.5 - 15.5 %   Platelets 193  150 - 400 K/uL   Neutrophils Relative % 65  43 - 77 %   Neutro Abs 6.6  1.7 - 7.7 K/uL   Lymphocytes Relative 28  12 - 46 %   Lymphs Abs 2.8  0.7 - 4.0 K/uL   Monocytes Relative 7  3 - 12 %   Monocytes Absolute 0.7  0.1 - 1.0 K/uL   Eosinophils Relative 0  0 - 5 %   Eosinophils Absolute 0.0  0.0 - 0.7 K/uL   Basophils Relative  0  0 - 1 %   Basophils Absolute 0.0  0.0 - 0.1 K/uL  COMPREHENSIVE METABOLIC PANEL     Status: Abnormal   Collection Time    10/26/13  4:30 AM      Result Value Ref Range   Sodium 141  137 - 147 mEq/L   Potassium 3.6 (*) 3.7 - 5.3 mEq/L   Chloride 100  96 - 112 mEq/L   CO2 30  19 - 32 mEq/L   Glucose, Bld 148 (*) 70 - 99 mg/dL   BUN 13  6 - 23 mg/dL   Creatinine, Ser 0.77  0.50 - 1.35 mg/dL   Calcium 9.5  8.4 - 10.5 mg/dL   Total Protein 7.9  6.0 - 8.3 g/dL   Albumin 3.4 (*) 3.5 - 5.2 g/dL   AST 26  0 - 37 U/L   ALT 15  0 - 53 U/L   Alkaline Phosphatase 102  39 - 117 U/L   Total Bilirubin 0.6  0.3 - 1.2 mg/dL   GFR calc  non Af Amer >90  >90 mL/min   GFR calc Af Amer >90  >90 mL/min   Comment: (NOTE)     The eGFR has been calculated using the CKD EPI equation.     This calculation has not been validated in all clinical situations.     eGFR's persistently <90 mL/min signify possible Chronic Kidney     Disease.    Dg Abd 2 Views  10/26/2013   CLINICAL DATA:  Constipation 2 days after appendicitis resection  EXAM: ABDOMEN - 2 VIEW  COMPARISON:  Abdominal CT from 3 days per  FINDINGS: There is a moderate volume of stool in the proximal colon. Gaseous distension of the more distal colon. No small bowel obstruction. No pneumoperitoneum. Bowel suture in the right lower quadrant correlating with recent appendectomy. The lung bases are clear.  IMPRESSION: Diffuse distention of the colon by gas and stool. No evidence of obstruction.   Electronically Signed   By: Jorje Guild M.D.   On: 10/26/2013 04:54    Review of Systems  Constitutional: Negative for weight loss.  HENT: Negative for ear discharge, ear pain, hearing loss and tinnitus.   Eyes: Negative for blurred vision, double vision, photophobia and pain.  Respiratory: Negative for cough, sputum production and shortness of breath.   Cardiovascular: Negative for chest pain.  Gastrointestinal: Positive for abdominal pain and  constipation. Negative for nausea and vomiting.  Genitourinary: Negative for dysuria, urgency, frequency and flank pain.  Musculoskeletal: Negative for back pain, falls, joint pain, myalgias and neck pain.  Neurological: Negative for dizziness, tingling, sensory change, focal weakness, loss of consciousness and headaches.  Endo/Heme/Allergies: Does not bruise/bleed easily.  Psychiatric/Behavioral: Negative for depression, memory loss and substance abuse. The patient is not nervous/anxious.    Blood pressure 171/110, pulse 76, temperature 98.4 F (36.9 C), temperature source Oral, resp. rate 18, weight 245 lb (111.131 kg), SpO2 99.00%. Physical Exam  Vitals reviewed. Constitutional: He is oriented to person, place, and time. He appears well-developed and well-nourished. He is cooperative. No distress. Cervical collar and nasal cannula in place.  HENT:  Head: Normocephalic and atraumatic. Head is without raccoon's eyes, without Battle's sign, without abrasion, without contusion and without laceration.  Right Ear: Hearing, tympanic membrane, external ear and ear canal normal. No lacerations. No drainage or tenderness. No foreign bodies. Tympanic membrane is not perforated. No hemotympanum.  Left Ear: Hearing, tympanic membrane, external ear and ear canal normal. No lacerations. No drainage or tenderness. No foreign bodies. Tympanic membrane is not perforated. No hemotympanum.  Nose: Nose normal. No nose lacerations, sinus tenderness, nasal deformity or nasal septal hematoma. No epistaxis.  Mouth/Throat: Uvula is midline, oropharynx is clear and moist and mucous membranes are normal. No lacerations.  Eyes: Conjunctivae, EOM and lids are normal. Pupils are equal, round, and reactive to light. No scleral icterus.  Neck: Trachea normal. No JVD present. No spinous process tenderness and no muscular tenderness present. Carotid bruit is not present. No thyromegaly present.  Cardiovascular: Normal rate,  regular rhythm, normal heart sounds, intact distal pulses and normal pulses.   Respiratory: Effort normal and breath sounds normal. No respiratory distress. He exhibits no tenderness, no bony tenderness, no laceration and no crepitus.  GI: Soft. Normal appearance and bowel sounds are normal. He exhibits distension. There is no tenderness. There is no rigidity, no rebound, no guarding and no CVA tenderness.  Musculoskeletal: Normal range of motion. He exhibits no edema and no tenderness.  Lymphadenopathy:  He has no cervical adenopathy.  Neurological: He is alert and oriented to person, place, and time. He has normal strength. No cranial nerve deficit or sensory deficit. GCS eye subscore is 4. GCS verbal subscore is 5. GCS motor subscore is 6.  Skin: Skin is warm, dry and intact. He is not diaphoretic.  Psychiatric: He has a normal mood and affect. His speech is normal and behavior is normal.    Assessment/Plan: 38 year old male with constipation status post lap appendectomy 1. Discussed with them that while taking pain medication I will recommend either MiraLax or Colace as stool softener. 2. I do not think there is any reason at this time to admit the patient as he is having bowel function at this time with relief of abdominal pain. 3. Patient followup as scheduled.  Ralene Ok 10/26/2013, 6:45 AM

## 2013-10-26 NOTE — ED Notes (Signed)
Pt up to restroom.

## 2013-10-26 NOTE — ED Provider Notes (Signed)
CSN: [id number]     Arrival date & time 10/26/13  0349 History   First MD Initiated Contact with Patient 10/26/13 0354     Chief Complaint  Patient presents with  . Abdominal Pain     (Consider location/radiation/quality/duration/timing/severity/associated sxs/prior Treatment) HPI Comments: Patient is a 38 year old male past medical history significant for hypertension presenting to the emergency department for postoperative abdominal discomfort with associated constipation. Patient recently underwent a laparoscopic appendectomy performed on June 2nd and was discharged June 3rd. He states he has not had a bowel movement since approximately 2 PM on Monday. He states he has been unable to pass gas either. He endorses that he has had intermittent severe cramping pain since being discharged home. He states he attempts to take one dose of over-the-counter laxative with no results. He denies any fevers, chills, nausea, vomiting, purulent drainage or redness to the surgical sites.  Has not taken HTN medication this AM.   Patient is a 38 y.o. male presenting with abdominal pain.  Abdominal Pain Associated symptoms: constipation   Associated symptoms: no chest pain, no chills, no diarrhea, no fever, no nausea, no shortness of breath and no vomiting     Past Medical History  Diagnosis Date  . Hypertension   . Allergy    Past Surgical History  Procedure Laterality Date  . Appendectomy     Family History  Problem Relation Age of Onset  . Asthma Son    History  Substance Use Topics  . Smoking status: Never Smoker   . Smokeless tobacco: Never Used  . Alcohol Use: Yes    Review of Systems  Constitutional: Negative for fever and chills.  Respiratory: Negative for shortness of breath.   Cardiovascular: Negative for chest pain.  Gastrointestinal: Positive for abdominal pain and constipation. Negative for nausea, vomiting and diarrhea.  All other systems reviewed and are  negative.     Allergies  Review of patient's allergies indicates no known allergies.  Home Medications   Prior to Admission medications   Medication Sig Start Date End Date Taking? Authorizing Provider  amLODipine (NORVASC) 5 MG tablet Take 1 tablet (5 mg total) by mouth daily. 10/15/13  Yes Elvina SidleKurt Lauenstein, MD  HYDROcodone-acetaminophen (NORCO/VICODIN) 5-325 MG per tablet Take 1-2 tablets by mouth every 6 (six) hours as needed for moderate pain. 10/24/13  Yes Megan Dort, PA-C   BP 171/110  Pulse 76  Temp(Src) 98.4 F (36.9 C) (Oral)  Resp 18  Wt 245 lb (111.131 kg)  SpO2 99% Physical Exam  Constitutional: He is oriented to person, place, and time. He appears well-developed and well-nourished. No distress.  HENT:  Head: Normocephalic and atraumatic.  Right Ear: External ear normal.  Left Ear: External ear normal.  Nose: Nose normal.  Mouth/Throat: No oropharyngeal exudate.  Eyes: Conjunctivae are normal.  Neck: Neck supple.  Cardiovascular: Normal rate, regular rhythm and normal heart sounds.   Pulmonary/Chest: Effort normal and breath sounds normal.  Abdominal: Soft. Bowel sounds are normal. There is tenderness. There is no rigidity, no rebound and no guarding.  Surgical sites CDI.   Musculoskeletal: Normal range of motion.  Neurological: He is alert and oriented to person, place, and time.  Skin: Skin is warm and dry. He is not diaphoretic.    ED Course  Procedures (including critical care time) Medications  polyethylene glycol (MIRALAX / GLYCOLAX) packet 17 g (not administered)  milk and molasses enema (not administered)  morphine 4 MG/ML injection 4 mg (4 mg Intravenous  Given 10/26/13 0429)  Glycerin (Adult) 2.1 G suppository 1 suppository (1 suppository Rectal Given 10/26/13 0510)    Labs Review Labs Reviewed  COMPREHENSIVE METABOLIC PANEL - Abnormal; Notable for the following:    Potassium 3.6 (*)    Glucose, Bld 148 (*)    Albumin 3.4 (*)    All other  components within normal limits  CBC WITH DIFFERENTIAL    Imaging Review Dg Abd 2 Views  10/26/2013   CLINICAL DATA:  Constipation 2 days after appendicitis resection  EXAM: ABDOMEN - 2 VIEW  COMPARISON:  Abdominal CT from 3 days per  FINDINGS: There is a moderate volume of stool in the proximal colon. Gaseous distension of the more distal colon. No small bowel obstruction. No pneumoperitoneum. Bowel suture in the right lower quadrant correlating with recent appendectomy. The lung bases are clear.  IMPRESSION: Diffuse distention of the colon by gas and stool. No evidence of obstruction.   Electronically Signed   By: Tiburcio Pea M.D.   On: 10/26/2013 04:54     EKG Interpretation None      MDM   Final diagnoses:  Constipation  Abdominal pain    Filed Vitals:   10/26/13 0457  BP: 171/110  Pulse:   Temp:   Resp:    Afebrile, NAD, non-toxic appearing, AAOx4. Abdomen soft, diffusely mildly tender, no rebound, guarding or rigidity. Normal bowel sounds. Surgical incisions are clean dry and intact. Labs reviewed. No leukocytosis. Abdominal x-ray reveals no small bowel obstruction no pneumoperitoneum. Does note diffuse distention of colon by gas and stool. Plan to treat constipation in the emergency department with Miralax, glycerin suppository, milk of molasses enema. [facility] surgery-consult if they will stop by to evaluate patient with likely plan discharge home. Patient d/w with Dr. Patria Mane, agrees with plan.    Will sign out to Marlon Pel, PA-C with disposition pending [facility] surgery consultation.   Jeannetta Ellis, PA-C 10/26/13 [phone]

## 2013-10-26 NOTE — ED Notes (Signed)
Patient transported to X-ray 

## 2013-10-26 NOTE — ED Notes (Signed)
MD at bedside. 

## 2013-11-07 ENCOUNTER — Encounter (INDEPENDENT_AMBULATORY_CARE_PROVIDER_SITE_OTHER): Payer: Self-pay | Admitting: General Surgery

## 2013-11-07 ENCOUNTER — Encounter (INDEPENDENT_AMBULATORY_CARE_PROVIDER_SITE_OTHER): Payer: Self-pay

## 2013-11-07 ENCOUNTER — Ambulatory Visit (INDEPENDENT_AMBULATORY_CARE_PROVIDER_SITE_OTHER): Payer: BC Managed Care – PPO | Admitting: General Surgery

## 2013-11-07 VITALS — BP 160/98 | HR 80 | Resp 16 | Ht 73.0 in | Wt 225.0 lb

## 2013-11-07 DIAGNOSIS — K3580 Unspecified acute appendicitis: Secondary | ICD-10-CM

## 2013-11-07 NOTE — Patient Instructions (Signed)
Follow up as needed

## 2013-11-07 NOTE — Progress Notes (Signed)
Jacob CapriceDARIAN A [name] June 15, 1975 [id number] 11/07/2013   History of Present Illness: Jacob NamDarian A [name] is a  38 y.o. male who presents today status post lap appy by Dr. Ovidio Kinavid Newman.  Pathology reveals acute suppurative appendicitis.  The patient is tolerating a regular diet, having normal bowel movements, has good pain control.  He  is back to most normal activities.   Physical Exam: Abd: soft, nontender, active bowel sounds, nondistended.  All incisions are well healed.  Impression: 1.  Acute appendicitis, s/p lap appy  Plan: He  is able to return to normal activities. He  may follow up on a prn basis.

## 2013-11-29 ENCOUNTER — Encounter (HOSPITAL_COMMUNITY): Payer: Self-pay | Admitting: Surgery

## 2015-01-29 ENCOUNTER — Emergency Department (HOSPITAL_COMMUNITY)
Admission: EM | Admit: 2015-01-29 | Discharge: 2015-01-29 | Disposition: A | Discharge status: Home / Self Care | Payer: Self-pay | Attending: Emergency Medicine | Admitting: Emergency Medicine

## 2015-01-29 ENCOUNTER — Encounter (HOSPITAL_COMMUNITY): Payer: Self-pay | Admitting: Emergency Medicine

## 2015-01-29 DIAGNOSIS — Y9289 Other specified places as the place of occurrence of the external cause: Secondary | ICD-10-CM | POA: Insufficient documentation

## 2015-01-29 DIAGNOSIS — Z79899 Other long term (current) drug therapy: Secondary | ICD-10-CM | POA: Insufficient documentation

## 2015-01-29 DIAGNOSIS — Y998 Other external cause status: Secondary | ICD-10-CM | POA: Insufficient documentation

## 2015-01-29 DIAGNOSIS — Y9389 Activity, other specified: Secondary | ICD-10-CM | POA: Insufficient documentation

## 2015-01-29 DIAGNOSIS — I10 Essential (primary) hypertension: Secondary | ICD-10-CM | POA: Insufficient documentation

## 2015-01-29 DIAGNOSIS — X58XXXA Exposure to other specified factors, initial encounter: Secondary | ICD-10-CM | POA: Insufficient documentation

## 2015-01-29 DIAGNOSIS — S46912A Strain of unspecified muscle, fascia and tendon at shoulder and upper arm level, left arm, initial encounter: Secondary | ICD-10-CM | POA: Insufficient documentation

## 2015-01-29 MED ORDER — METHOCARBAMOL 500 MG PO TABS: 1000.0000 mg | ORAL_TABLET | Freq: Two times a day (BID) | ORAL | Status: DC

## 2015-01-29 MED ORDER — IBUPROFEN 800 MG PO TABS
800.0000 mg | ORAL_TABLET | Freq: Once | ORAL | Status: AC
  Administered 2015-01-29: 800 mg via ORAL
  Filled 2015-01-29: qty 1

## 2015-01-29 MED ORDER — IBUPROFEN 800 MG PO TABS: 800.0000 mg | ORAL_TABLET | Freq: Three times a day (TID) | ORAL | Status: DC

## 2015-01-29 NOTE — ED Notes (Addendum)
Pt thinks he carried too much weight on his left arm yesterday. Feels like when he extends his arm, "the muscles are pulling down." Says pain is in the upper left arm and shoulder- no previous injuries or surgeries on this arm. Patient is texting with his left arm. Has tried Advil with no alleviation of pain. Says, "I need to know if I can work tomorrow because I have to be able to lift stuff." No other c/c.

## 2015-01-29 NOTE — ED Provider Notes (Signed)
CSN: [id number]     Arrival date & time 01/29/15  0056 History   First MD Initiated Contact with Patient 01/29/15 0203     Chief Complaint  Patient presents with  . Shoulder Pain    left     (Consider location/radiation/quality/duration/timing/severity/associated sxs/prior Treatment) Patient is a 39 y.o. male presenting with shoulder pain. The history is provided by the patient. No language interpreter was used.  Shoulder Pain Location:  Shoulder Shoulder location:  L shoulder Pain details:    Quality:  Aching and burning   Severity:  Moderate   Onset quality:  Gradual   Duration:  1 day Chronicity:  New Dislocation: no   Associated symptoms: decreased range of motion   Associated symptoms: no back pain, no fever, no muscle weakness, no neck pain, no numbness and no swelling   Associated symptoms comment:  Left shoulder pain after carrying a heavy object with the left arm and feeling a burning sensation in the shoulder that persisted after putting the object down. No swelling. No neck pain. No previous shoulder problems. He denies chest pain, SOB. The pain is worse with movement, better with rest.    Past Medical History  Diagnosis Date  . Hypertension   . Allergy    Past Surgical History  Procedure Laterality Date  . Appendectomy    . Laparoscopic appendectomy  10/24/2013    Procedure: APPENDECTOMY LAPAROSCOPIC;  Surgeon: Kandis Cocking, MD;  Location: [facility];  Service: General;;   Family History  Problem Relation Age of Onset  . Asthma Son    Social History  Substance Use Topics  . Smoking status: Never Smoker   . Smokeless tobacco: Never Used  . Alcohol Use: Yes    Review of Systems  Constitutional: Negative for fever and chills.  Respiratory: Negative.   Cardiovascular: Negative.   Gastrointestinal: Negative.   Musculoskeletal: Negative for back pain and neck pain.       See HPI  Skin: Negative.   Neurological: Negative.       Allergies  Review of  patient's allergies indicates no known allergies.  Home Medications   Prior to Admission medications   Medication Sig Start Date End Date Taking? Authorizing Provider  amLODipine (NORVASC) 5 MG tablet Take 1 tablet (5 mg total) by mouth daily. 10/15/13  Yes Elvina Sidle, MD  ibuprofen (ADVIL,MOTRIN) 200 MG tablet Take 600 mg by mouth every 6 (six) hours as needed for moderate pain.   Yes Historical Provider, MD  HYDROcodone-acetaminophen (NORCO/VICODIN) 5-325 MG per tablet Take 1-2 tablets by mouth every 6 (six) hours as needed for moderate pain. Patient not taking: Reported on 01/29/2015 10/24/13   Nonie Hoyer, PA-C  polyethylene glycol powder (MIRALAX) powder Take 17 g by mouth daily. Patient not taking: Reported on 01/29/2015 10/26/13   Azalia Bilis, MD   BP 174/114 mmHg  Pulse 79  Temp(Src) 98.4 F (36.9 C) (Oral)  Resp 18  SpO2 95% Physical Exam  Constitutional: He is oriented to person, place, and time. He appears well-developed and well-nourished.  Neck: Normal range of motion.  Cardiovascular: Intact distal pulses.   Pulmonary/Chest: Effort normal. He exhibits no tenderness.  Musculoskeletal: Normal range of motion.  Left shoulder is without swelling or discoloration. Tender over deltoid. ROM limited on abduction of the shoulder secondary to discomfort. 5/5 grip strength of the hand. No midline or paracervical tenderness.   Neurological: He is alert and oriented to person, place, and time.  Skin: Skin is  warm and dry.  Psychiatric: He has a normal mood and affect.    ED Course  Procedures (including critical care time) Labs Review Labs Reviewed - No data to display  Imaging Review No results found. I have personally reviewed and evaluated these images and lab results as part of my medical decision-making.   EKG Interpretation None      MDM   Final diagnoses:  None    1. Left shoulder strain  Sling provided with the caution of wearing only when active.  Orthopedic follow up prn if symptoms worsen.     Elpidio Anis, PA-C 01/29/15 0532  Tomasita Crumble, MD 01/29/15 [phone]

## 2015-01-29 NOTE — Discharge Instructions (Signed)
Cryotherapy °Cryotherapy means treatment with cold. Ice or gel packs can be used to reduce both pain and swelling. Ice is the most helpful within the first 24 to 48 hours after an injury or flare-up from overusing a muscle or joint. Sprains, strains, spasms, burning pain, shooting pain, and aches can all be eased with ice. Ice can also be used when recovering from surgery. Ice is effective, has very few side effects, and is safe for most people to use. °PRECAUTIONS  °Ice is not a safe treatment option for people with: °· Raynaud phenomenon. This is a condition affecting small blood vessels in the extremities. Exposure to cold may cause your problems to return. °· Cold hypersensitivity. There are many forms of cold hypersensitivity, including: °¨ Cold urticaria. Red, itchy hives appear on the skin when the tissues begin to warm after being iced. °¨ Cold erythema. This is a red, itchy rash caused by exposure to cold. °¨ Cold hemoglobinuria. Red blood cells break down when the tissues begin to warm after being iced. The hemoglobin that carry oxygen are passed into the urine because they cannot combine with blood proteins fast enough. °· Numbness or altered sensitivity in the area being iced. °If you have any of the following conditions, do not use ice until you have discussed cryotherapy with your caregiver: °· Heart conditions, such as arrhythmia, angina, or chronic heart disease. °· High blood pressure. °· Healing wounds or open skin in the area being iced. °· Current infections. °· Rheumatoid arthritis. °· Poor circulation. °· Diabetes. °Ice slows the blood flow in the region it is applied. This is beneficial when trying to stop inflamed tissues from spreading irritating chemicals to surrounding tissues. However, if you expose your skin to cold temperatures for too long or without the proper protection, you can damage your skin or nerves. Watch for signs of skin damage due to cold. °HOME CARE INSTRUCTIONS °Follow  these tips to use ice and cold packs safely. °· Place a dry or damp towel between the ice and skin. A damp towel will cool the skin more quickly, so you may need to shorten the time that the ice is used. °· For a more rapid response, add gentle compression to the ice. °· Ice for no more than 10 to 20 minutes at a time. The bonier the area you are icing, the less time it will take to get the benefits of ice. °· Check your skin after 5 minutes to make sure there are no signs of a poor response to cold or skin damage. °· Rest 20 minutes or more between uses. °· Once your skin is numb, you can end your treatment. You can test numbness by very lightly touching your skin. The touch should be so light that you do not see the skin dimple from the pressure of your fingertip. When using ice, most people will feel these normal sensations in this order: cold, burning, aching, and numbness. °· Do not use ice on someone who cannot communicate their responses to pain, such as small children or people with dementia. °HOW TO MAKE AN ICE PACK °Ice packs are the most common way to use ice therapy. Other methods include ice massage, ice baths, and cryosprays. Muscle creams that cause a cold, tingly feeling do not offer the same benefits that ice offers and should not be used as a substitute unless recommended by your caregiver. °To make an ice pack, do one of the following: °· Place crushed ice or a   bag of frozen vegetables in a sealable plastic bag. Squeeze out the excess air. Place this bag inside another plastic bag. Slide the bag into a pillowcase or place a damp towel between your skin and the bag. °· Mix 3 parts water with 1 part rubbing alcohol. Freeze the mixture in a sealable plastic bag. When you remove the mixture from the freezer, it will be slushy. Squeeze out the excess air. Place this bag inside another plastic bag. Slide the bag into a pillowcase or place a damp towel between your skin and the bag. °SEEK MEDICAL CARE  IF: °· You develop white spots on your skin. This may give the skin a blotchy (mottled) appearance. °· Your skin turns blue or pale. °· Your skin becomes waxy or hard. °· Your swelling gets worse. °MAKE SURE YOU:  °· Understand these instructions. °· Will watch your condition. °· Will get help right away if you are not doing well or get worse. °Document Released: 01/05/2011 Document Revised: 09/25/2013 Document Reviewed: 01/05/2011 °ExitCare® Patient Information ©2015 [employer]. This information is not intended to replace advice given to you by your health care provider. Make sure you discuss any questions you have with your health care provider. °Shoulder Sprain °A shoulder sprain is the result of damage to the tough, fiber-like tissues (ligaments) that help hold your shoulder in place. The ligaments may be stretched or torn. Besides the main shoulder joint (the ball and socket), there are several smaller joints that connect the bones in this area. A sprain usually involves one of those joints. Most often it is the acromioclavicular (or AC) joint. That is the joint that connects the collarbone (clavicle) and the shoulder blade (scapula) at the top point of the shoulder blade (acromion). °A shoulder sprain is a mild form of what is called a shoulder separation. Recovering from a shoulder sprain may take some time. For some, pain lingers for several months. Most people recover without long term problems. °CAUSES  °· A shoulder sprain is usually caused by some kind of trauma. This might be: °¨ Falling on an outstretched arm. °¨ Being hit hard on the shoulder. °¨ Twisting the arm. °· Shoulder sprains are more likely to occur in people who: °¨ Play sports. °¨ Have balance or coordination problems. °SYMPTOMS  °· Pain when you move your shoulder. °· Limited ability to move the shoulder. °· Swelling and tenderness on top of the shoulder. °· Redness or warmth in the shoulder. °· Bruising. °· A change in the shape of the  shoulder. °DIAGNOSIS  °Your healthcare provider may: °· Ask about your symptoms. °· Ask about recent activity that might have caused those symptoms. °· Examine your shoulder. You may be asked to do simple exercises to test movement. The other shoulder will be examined for comparison. °· Order some tests that provide a look inside the body. They can show the extent of the injury. The tests could include: °¨ X-rays. °¨ CT (computed tomography) scan. °¨ MRI (magnetic resonance imaging) scan. °RISKS AND COMPLICATIONS °· Loss of full shoulder motion. °· Ongoing shoulder pain. °TREATMENT  °How long it takes to recover from a shoulder sprain depends on how severe it was. Treatment options may include: °· Rest. You should not use the arm or shoulder until it heals. °· Ice. For 2 or 3 days after the injury, put an ice pack on the shoulder up to 4 times a day. It should stay on for 15 to 20 minutes each time. Wrap   the ice in a towel so it does not touch your skin.  Over-the-counter medicine to relieve pain.  A sling or brace. This will keep the arm still while the shoulder is healing.  Physical therapy or rehabilitation exercises. These will help you regain strength and motion. Ask your healthcare provider when it is OK to begin these exercises.  Surgery. The need for surgery is rare with a sprained shoulder, but some people may need surgery to keep the joint in place and reduce pain. HOME CARE INSTRUCTIONS   Ask your healthcare provider about what you should and should not do while your shoulder heals.  Make sure you know how to apply ice to the correct area of your shoulder.  Talk with your healthcare provider about which medications should be used for pain and swelling.  If rehabilitation therapy will be needed, ask your healthcare provider to refer you to a therapist. If it is not recommended, then ask about at-home exercises. Find out when exercise should begin. SEEK MEDICAL CARE IF:  Your pain,  swelling, or redness at the joint increases. SEEK IMMEDIATE MEDICAL CARE IF:   You have a fever.  You cannot move your arm or shoulder. Document Released: 09/27/2008 Document Revised: 08/03/2011 Document Reviewed: 09/27/2008 [facility] Patient Information 2015 [place], [place]. This information is not intended to replace advice given to you by your health care provider. Make sure you discuss any questions you have with your health care provider.

## 2018-02-23 ENCOUNTER — Ambulatory Visit: Payer: Self-pay | Attending: Family Medicine | Admitting: Family Medicine

## 2018-02-23 ENCOUNTER — Encounter: Payer: Self-pay | Admitting: Family Medicine

## 2018-02-23 VITALS — BP 150/110 | HR 72 | Temp 98.5°F | Ht 73.0 in | Wt 235.0 lb

## 2018-02-23 DIAGNOSIS — R399 Unspecified symptoms and signs involving the genitourinary system: Secondary | ICD-10-CM

## 2018-02-23 DIAGNOSIS — Z7984 Long term (current) use of oral hypoglycemic drugs: Secondary | ICD-10-CM | POA: Insufficient documentation

## 2018-02-23 DIAGNOSIS — E119 Type 2 diabetes mellitus without complications: Secondary | ICD-10-CM | POA: Insufficient documentation

## 2018-02-23 DIAGNOSIS — R1031 Right lower quadrant pain: Secondary | ICD-10-CM | POA: Insufficient documentation

## 2018-02-23 DIAGNOSIS — I10 Essential (primary) hypertension: Secondary | ICD-10-CM

## 2018-02-23 DIAGNOSIS — Z79899 Other long term (current) drug therapy: Secondary | ICD-10-CM | POA: Insufficient documentation

## 2018-02-23 DIAGNOSIS — B353 Tinea pedis: Secondary | ICD-10-CM

## 2018-02-23 LAB — POCT URINALYSIS DIP (CLINITEK)
Bilirubin, UA: NEGATIVE
Blood, UA: NEGATIVE
Glucose, UA: 500 mg/dL — AB
Ketones, POC UA: NEGATIVE mg/dL
Leukocytes, UA: NEGATIVE
Nitrite, UA: NEGATIVE
POC,PROTEIN,UA: NEGATIVE
Spec Grav, UA: 1.02 (ref 1.010–1.025)
UROBILINOGEN UA: 1 U/dL
pH, UA: 6 (ref 5.0–8.0)

## 2018-02-23 LAB — GLUCOSE, POCT (MANUAL RESULT ENTRY): POC Glucose: 299 mg/dl — AB (ref 70–99)

## 2018-02-23 LAB — POCT GLYCOSYLATED HEMOGLOBIN (HGB A1C): HEMOGLOBIN A1C: 12.6 % — AB (ref 4.0–5.6)

## 2018-02-23 MED ORDER — METFORMIN HCL 500 MG PO TABS: 1000.0000 mg | ORAL_TABLET | Freq: Two times a day (BID) | ORAL | 3 refills | Status: DC

## 2018-02-23 MED ORDER — TRUE METRIX METER DEVI: 1.0000 | Freq: Three times a day (TID) | 0 refills | Status: DC

## 2018-02-23 MED ORDER — CLOTRIMAZOLE 1 % EX CREA: 1.0000 "application " | TOPICAL_CREAM | Freq: Two times a day (BID) | CUTANEOUS | 1 refills | Status: DC

## 2018-02-23 MED ORDER — TRUEPLUS LANCETS 28G MISC: 1.0000 | Freq: Three times a day (TID) | 12 refills | Status: DC

## 2018-02-23 MED ORDER — ATORVASTATIN CALCIUM 20 MG PO TABS: 20.0000 mg | ORAL_TABLET | Freq: Every day | ORAL | 3 refills | Status: DC

## 2018-02-23 MED ORDER — GLIPIZIDE 5 MG PO TABS: 5.0000 mg | ORAL_TABLET | Freq: Two times a day (BID) | ORAL | 3 refills | Status: DC

## 2018-02-23 MED ORDER — GLUCOSE BLOOD VI STRP
ORAL_STRIP | 12 refills | Status: DC
Start: 2018-02-23 — End: 2020-10-31

## 2018-02-23 MED ORDER — LISINOPRIL 10 MG PO TABS: 5.0000 mg | ORAL_TABLET | Freq: Every day | ORAL | 3 refills | Status: DC

## 2018-02-23 MED FILL — TRUEplus LANCETS 28G MISC: 30 days supply | Qty: 100 | Fill #0

## 2018-02-23 MED FILL — TRUE METRIX TEST STRIP: 30 days supply | Qty: 100 | Fill #0

## 2018-02-23 MED FILL — ATORVASTATIN 20 MG TABLET: 20 | 30 days supply | Qty: 30 | Fill #0

## 2018-02-23 MED FILL — !TRUE METRIX BLOOD GLUCOSE: 365 days supply | Qty: 1 | Fill #0

## 2018-02-23 MED FILL — metFORMIN HCL 500 MG TABS: 500 | 30 days supply | Qty: 120 | Fill #0

## 2018-02-23 MED FILL — glipiZIDE 5 MG TABS: 5 | 30 days supply | Qty: 60 | Fill #0

## 2018-02-23 MED FILL — LISINOPRIL 10 MG TABS: 10 | 30 days supply | Qty: 15 | Fill #0

## 2018-02-23 NOTE — Patient Instructions (Signed)
Diabetes Mellitus and Nutrition When you have diabetes (diabetes mellitus), it is very important to have healthy eating habits because your blood sugar (glucose) levels are greatly affected by what you eat and drink. Eating healthy foods in the appropriate amounts, at about the same times every day, can help you:  Control your blood glucose.  Lower your risk of heart disease.  Improve your blood pressure.  Reach or maintain a healthy weight.  Every person with diabetes is different, and each person has different needs for a meal plan. Your health care provider may recommend that you work with a diet and nutrition specialist (dietitian) to make a meal plan that is best for you. Your meal plan may vary depending on factors such as:  The calories you need.  The medicines you take.  Your weight.  Your blood glucose, blood pressure, and cholesterol levels.  Your activity level.  Other health conditions you have, such as heart or kidney disease.  How do carbohydrates affect me? Carbohydrates affect your blood glucose level more than any other type of food. Eating carbohydrates naturally increases the amount of glucose in your blood. Carbohydrate counting is a method for keeping track of how many carbohydrates you eat. Counting carbohydrates is important to keep your blood glucose at a healthy level, especially if you use insulin or take certain oral diabetes medicines. It is important to know how many carbohydrates you can safely have in each meal. This is different for every person. Your dietitian can help you calculate how many carbohydrates you should have at each meal and for snack. Foods that contain carbohydrates include:  Bread, cereal, rice, pasta, and crackers.  Potatoes and corn.  Peas, beans, and lentils.  Milk and yogurt.  Fruit and juice.  Desserts, such as cakes, cookies, ice cream, and candy.  How does alcohol affect me? Alcohol can cause a sudden decrease in blood  glucose (hypoglycemia), especially if you use insulin or take certain oral diabetes medicines. Hypoglycemia can be a life-threatening condition. Symptoms of hypoglycemia (sleepiness, dizziness, and confusion) are similar to symptoms of having too much alcohol. If your health care provider says that alcohol is safe for you, follow these guidelines:  Limit alcohol intake to no more than 1 drink per day for nonpregnant women and 2 drinks per day for men. One drink equals 12 oz of beer, 5 oz of wine, or 1 oz of hard liquor.  Do not drink on an empty stomach.  Keep yourself hydrated with water, diet soda, or unsweetened iced tea.  Keep in mind that regular soda, juice, and other mixers may contain a lot of sugar and must be counted as carbohydrates.  What are tips for following this plan? Reading food labels  Start by checking the serving size on the label. The amount of calories, carbohydrates, fats, and other nutrients listed on the label are based on one serving of the food. Many foods contain more than one serving per package.  Check the total grams (g) of carbohydrates in one serving. You can calculate the number of servings of carbohydrates in one serving by dividing the total carbohydrates by 15. For example, if a food has 30 g of total carbohydrates, it would be equal to 2 servings of carbohydrates.  Check the number of grams (g) of saturated and trans fats in one serving. Choose foods that have low or no amount of these fats.  Check the number of milligrams (mg) of sodium in one serving. Most people   should limit total sodium intake to less than 2,300 mg per day.  Always check the nutrition information of foods labeled as "low-fat" or "nonfat". These foods may be higher in added sugar or refined carbohydrates and should be avoided.  Talk to your dietitian to identify your daily goals for nutrients listed on the label. Shopping  Avoid buying canned, premade, or processed foods. These  foods tend to be high in fat, sodium, and added sugar.  Shop around the outside edge of the grocery store. This includes fresh fruits and vegetables, bulk grains, fresh meats, and fresh dairy. Cooking  Use low-heat cooking methods, such as baking, instead of high-heat cooking methods like deep frying.  Cook using healthy oils, such as olive, canola, or sunflower oil.  Avoid cooking with butter, cream, or high-fat meats. Meal planning  Eat meals and snacks regularly, preferably at the same times every day. Avoid going long periods of time without eating.  Eat foods high in fiber, such as fresh fruits, vegetables, beans, and whole grains. Talk to your dietitian about how many servings of carbohydrates you can eat at each meal.  Eat 4-6 ounces of lean protein each day, such as lean meat, chicken, fish, eggs, or tofu. 1 ounce is equal to 1 ounce of meat, chicken, or fish, 1 egg, or 1/4 cup of tofu.  Eat some foods each day that contain healthy fats, such as avocado, nuts, seeds, and fish. Lifestyle   Check your blood glucose regularly.  Exercise at least 30 minutes 5 or more days each week, or as told by your health care provider.  Take medicines as told by your health care provider.  Do not use any products that contain nicotine or tobacco, such as cigarettes and e-cigarettes. If you need help quitting, ask your health care provider.  Work with a counselor or diabetes educator to identify strategies to manage stress and any emotional and social challenges. What are some questions to ask my health care provider?  Do I need to meet with a diabetes educator?  Do I need to meet with a dietitian?  What number can I call if I have questions?  When are the best times to check my blood glucose? Where to find more information:  American Diabetes Association: diabetes.org/food-and-fitness/food  Academy of Nutrition and Dietetics:  www.eatright.org/resources/health/diseases-and-conditions/diabetes  National Institute of Diabetes and Digestive and Kidney Diseases (NIH): www.niddk.nih.gov/health-information/diabetes/overview/diet-eating-physical-activity Summary  A healthy meal plan will help you control your blood glucose and maintain a healthy lifestyle.  Working with a diet and nutrition specialist (dietitian) can help you make a meal plan that is best for you.  Keep in mind that carbohydrates and alcohol have immediate effects on your blood glucose levels. It is important to count carbohydrates and to use alcohol carefully. This information is not intended to replace advice given to you by your health care provider. Make sure you discuss any questions you have with your health care provider. Document Released: 02/05/2005 Document Revised: 06/15/2016 Document Reviewed: 06/15/2016 Elsevier Interactive Patient Education  2018 Elsevier Inc.  

## 2018-02-23 NOTE — Progress Notes (Signed)
Patient has been having pain in groin area for 2 weeks.  Patient has rash on feet.  [id number]

## 2018-02-23 NOTE — Progress Notes (Signed)
Subjective:  Patient ID: Jacob [name], male    DOB: 10-14-1975  Age: 42 y.o. MRN: [id number]  CC: New Patient (Initial Visit)   HPI Jacob [name] is a 42 year old male with a history of hypertension, type 2 diabetes mellitus (A1c 12.7) who presents today to establish care.  He was last seen by a physician 5 years ago and states he has been intermittently taking metformin.  His blood pressure is elevated and he was previously on antihypertensives in the past but has not taken any lately. Today he complains of pruritic rash for the last few weeks on both feet but denies the presence of rash on his hands.  He has not used any OTC creams for his symptoms.  He denies numbness, blurry vision, hypoglycemia and has no glucometer. He has also noticed intermittent right groin pain which he attributes to heavy lifting. He has noticed intermittent discomfort in the tip of his pain is when he urinates but denies frank dysuria, lower abdominal pain, penile discharge.  Past Medical History:  Diagnosis Date  . Allergy   . Diabetes mellitus without complication ([place])   . Hypertension     Past Surgical History:  Procedure Laterality Date  . APPENDECTOMY    . LAPAROSCOPIC APPENDECTOMY  10/24/2013   Procedure: APPENDECTOMY LAPAROSCOPIC;  Surgeon: Shann Medal, MD;  Location: [facility];  Service: General;;    No Known Allergies   Outpatient Medications Prior to Visit  Medication Sig Dispense Refill  . amLODipine (NORVASC) 5 MG tablet Take 1 tablet (5 mg total) by mouth daily. (Patient not taking: Reported on 02/23/2018) 30 tablet 3  . HYDROcodone-acetaminophen (NORCO/VICODIN) 5-325 MG per tablet Take 1-2 tablets by mouth every 6 (six) hours as needed for moderate pain. (Patient not taking: Reported on 01/29/2015) 40 tablet 0  . ibuprofen (ADVIL,MOTRIN) 800 MG tablet Take 1 tablet (800 mg total) by mouth 3 (three) times daily. (Patient not taking: Reported on 02/23/2018) 21 tablet 0  . methocarbamol  (ROBAXIN) 500 MG tablet Take 2 tablets (1,000 mg total) by mouth 2 (two) times daily. (Patient not taking: Reported on 02/23/2018) 20 tablet 0  . polyethylene glycol powder (MIRALAX) powder Take 17 g by mouth daily. (Patient not taking: Reported on 01/29/2015) 255 g 0   No facility-administered medications prior to visit.     ROS Review of Systems  Constitutional: Negative for activity change and appetite change.  HENT: Negative for sinus pressure and sore throat.   Eyes: Negative for visual disturbance.  Respiratory: Negative for cough, chest tightness and shortness of breath.   Cardiovascular: Negative for chest pain and leg swelling.  Gastrointestinal: Negative for abdominal distention, abdominal pain, constipation and diarrhea.  Endocrine: Negative.   Genitourinary: Negative for dysuria.  Musculoskeletal: Negative for joint swelling and myalgias.  Skin: Positive for rash.  Allergic/Immunologic: Negative.   Neurological: Negative for weakness, light-headedness and numbness.  Psychiatric/Behavioral: Negative for dysphoric mood and suicidal ideas.    Objective:  BP (!) 150/110 (BP Location: Right Arm, Patient Position: Sitting, Cuff Size: Normal)   Pulse 72   Temp 98.5 F (36.9 C) (Oral)   Ht 6' 1"  (1.854 m)   Wt 235 lb (106.6 kg)   SpO2 96%   BMI 31.00 kg/m   BP/Weight 02/23/2018 01/29/2015 11/08/735  Systolic BP 106 269 485  Diastolic BP 462 93 98  Wt. (Lbs) 235 - 225  BMI 31 - 29.69      Physical Exam  Constitutional: He is  oriented to person, place, and time. He appears well-developed and well-nourished.  Cardiovascular: Normal rate, normal heart sounds and intact distal pulses.  No murmur heard. Pulmonary/Chest: Effort normal and breath sounds normal. He has no wheezes. He has no rales. He exhibits no tenderness.  Abdominal: Soft. Bowel sounds are normal. He exhibits no distension and no mass. There is no tenderness.  Musculoskeletal: Normal range of motion.    Neurological: He is alert and oriented to person, place, and time.  Skin:  Erythematous rash on medial and lateral posterior half of both feet worse on the left foot  Psychiatric: He has a normal mood and affect.    Lab Results  Component Value Date   HGBA1C 12.6 (A) 02/23/2018    Assessment & Plan:   1. Diabetes mellitus without complication ([place]) Uncontrolled with A1c of 12.6 He has not been on medications and is reluctant to be placed on an injectable We will resume metformin and add glipizide to regimen Diabetic teaching provided today by myself on the clinical pharmacist We will review blood sugar log at next visit and if still uncontrolled will titrate up his dose of glipizide and consider an injectable. - POCT glucose (manual entry) - POCT glycosylated hemoglobin (Hb A1C) - glucose blood (TRUE METRIX BLOOD GLUCOSE TEST) test strip; Use three times daily  Dispense: 100 each; Refill: 12 - Blood Glucose Monitoring Suppl (TRUE METRIX METER) DEVI; 1 each by Does not apply route 3 (three) times daily before meals.  Dispense: 1 Device; Refill: 0 - TRUEPLUS LANCETS 28G MISC; 1 each by Does not apply route 3 (three) times daily before meals.  Dispense: 100 each; Refill: 12 - metFORMIN (GLUCOPHAGE) 500 MG tablet; Take 2 tablets (1,000 mg total) by mouth 2 (two) times daily with a meal.  Dispense: 120 tablet; Refill: 3 - glipiZIDE (GLUCOTROL) 5 MG tablet; Take 1 tablet (5 mg total) by mouth 2 (two) times daily before a meal.  Dispense: 60 tablet; Refill: 3 - atorvastatin (LIPITOR) 20 MG tablet; Take 1 tablet (20 mg total) by mouth daily.  Dispense: 30 tablet; Refill: 3 - Microalbumin/Creatinine Ratio, Urine - CMP14+EGFR - Lipid panel  2. Essential hypertension Uncontrolled due to being off medications Commence lisinopril Counseled on blood pressure goal of less than 130/80, low-sodium, DASH diet, medication compliance, 150 minutes of moderate intensity exercise per week. Discussed  medication compliance, adverse effects. - lisinopril (PRINIVIL,ZESTRIL) 10 MG tablet; Take 0.5 tablets (5 mg total) by mouth daily.  Dispense: 30 tablet; Refill: 3  3. Tinea pedis of both feet - clotrimazole (LOTRIMIN) 1 % cream; Apply 1 application topically 2 (two) times daily.  Dispense: 30 g; Refill: 1  4. Urinary symptom or sign - Urine cytology ancillary only   Meds ordered this encounter  Medications  . glucose blood (TRUE METRIX BLOOD GLUCOSE TEST) test strip    Sig: Use three times daily    Dispense:  100 each    Refill:  12  . Blood Glucose Monitoring Suppl (TRUE METRIX METER) DEVI    Sig: 1 each by Does not apply route 3 (three) times daily before meals.    Dispense:  1 Device    Refill:  0  . TRUEPLUS LANCETS 28G MISC    Sig: 1 each by Does not apply route 3 (three) times daily before meals.    Dispense:  100 each    Refill:  12  . metFORMIN (GLUCOPHAGE) 500 MG tablet    Sig: Take 2 tablets (1,000  mg total) by mouth 2 (two) times daily with a meal.    Dispense:  120 tablet    Refill:  3  . glipiZIDE (GLUCOTROL) 5 MG tablet    Sig: Take 1 tablet (5 mg total) by mouth 2 (two) times daily before a meal.    Dispense:  60 tablet    Refill:  3  . atorvastatin (LIPITOR) 20 MG tablet    Sig: Take 1 tablet (20 mg total) by mouth daily.    Dispense:  30 tablet    Refill:  3  . lisinopril (PRINIVIL,ZESTRIL) 10 MG tablet    Sig: Take 0.5 tablets (5 mg total) by mouth daily.    Dispense:  30 tablet    Refill:  3  . clotrimazole (LOTRIMIN) 1 % cream    Sig: Apply 1 application topically 2 (two) times daily.    Dispense:  30 g    Refill:  1    Follow-up: No follow-ups on file.   Charlott Rakes MD

## 2018-02-24 ENCOUNTER — Telehealth: Payer: Self-pay

## 2018-02-24 LAB — CMP14+EGFR
A/G RATIO: 1.4 (ref 1.2–2.2)
ALT: 26 IU/L (ref 0–44)
AST: 40 IU/L (ref 0–40)
Albumin: 4.2 g/dL (ref 3.5–5.5)
Alkaline Phosphatase: 135 IU/L — ABNORMAL HIGH (ref 39–117)
BILIRUBIN TOTAL: 1 mg/dL (ref 0.0–1.2)
BUN / CREAT RATIO: 11 (ref 9–20)
BUN: 11 mg/dL (ref 6–24)
CO2: 24 mmol/L (ref 20–29)
Calcium: 9.3 mg/dL (ref 8.7–10.2)
Chloride: 100 mmol/L (ref 96–106)
Creatinine, Ser: 0.98 mg/dL (ref 0.76–1.27)
GFR calc Af Amer: 108 mL/min/{1.73_m2} (ref >59)
GFR calc non Af Amer: 93 mL/min/{1.73_m2} (ref >59)
Globulin, Total: 3 g/dL (ref 1.5–4.5)
Glucose: 304 mg/dL — ABNORMAL HIGH (ref 65–99)
POTASSIUM: 3.9 mmol/L (ref 3.5–5.2)
Sodium: 141 mmol/L (ref 134–144)
TOTAL PROTEIN: 7.2 g/dL (ref 6.0–8.5)

## 2018-02-24 LAB — URINE CYTOLOGY ANCILLARY ONLY
Chlamydia: NEGATIVE
NEISSERIA GONORRHEA: NEGATIVE
Trichomonas: NEGATIVE

## 2018-02-24 LAB — MICROALBUMIN / CREATININE URINE RATIO
CREATININE, UR: 104.9 mg/dL
Microalb/Creat Ratio: 25.2 mg/g creat (ref 0.0–30.0)
Microalbumin, Urine: 26.4 ug/mL

## 2018-02-24 LAB — LIPID PANEL
Chol/HDL Ratio: 6.6 ratio — ABNORMAL HIGH (ref 0.0–5.0)
Cholesterol, Total: 239 mg/dL — ABNORMAL HIGH (ref 100–199)
HDL: 36 mg/dL — ABNORMAL LOW (ref >39)
LDL Calculated: 132 mg/dL — ABNORMAL HIGH (ref 0–99)
Triglycerides: 355 mg/dL — ABNORMAL HIGH (ref 0–149)
VLDL Cholesterol Cal: 71 mg/dL — ABNORMAL HIGH (ref 5–40)

## 2018-02-24 NOTE — Telephone Encounter (Signed)
Patient was called and informed of lab results. 

## 2018-02-24 NOTE — Telephone Encounter (Signed)
-----   Message from Hoy Register, MD sent at 02/24/2018  2:33 PM EDT ----- Cholesterol is significantly elevated.  Please encourage to comply with Lipitor which was prescribed at his last office visit along with a low-cholesterol diet and exercise.  Urine test is normal.

## 2018-03-03 ENCOUNTER — Telehealth: Payer: Self-pay

## 2018-03-03 NOTE — Telephone Encounter (Signed)
-----   Message from Hoy Register, MD sent at 02/25/2018  9:27 AM EDT ----- Urine test is negative for STDs

## 2018-03-03 NOTE — Telephone Encounter (Signed)
Patient was called and informed of lab results. 

## 2018-03-08 ENCOUNTER — Ambulatory Visit: Payer: Self-pay | Admitting: Family Medicine

## 2018-03-28 ENCOUNTER — Encounter: Payer: Self-pay | Admitting: Family Medicine

## 2018-03-28 ENCOUNTER — Ambulatory Visit: Payer: Self-pay | Attending: Family Medicine | Admitting: Family Medicine

## 2018-03-28 VITALS — BP 160/110 | HR 82 | Temp 98.5°F | Ht 73.0 in | Wt 234.4 lb

## 2018-03-28 DIAGNOSIS — I10 Essential (primary) hypertension: Secondary | ICD-10-CM | POA: Insufficient documentation

## 2018-03-28 DIAGNOSIS — Z7984 Long term (current) use of oral hypoglycemic drugs: Secondary | ICD-10-CM | POA: Insufficient documentation

## 2018-03-28 DIAGNOSIS — E785 Hyperlipidemia, unspecified: Secondary | ICD-10-CM | POA: Insufficient documentation

## 2018-03-28 DIAGNOSIS — Z9889 Other specified postprocedural states: Secondary | ICD-10-CM | POA: Insufficient documentation

## 2018-03-28 DIAGNOSIS — E782 Mixed hyperlipidemia: Secondary | ICD-10-CM | POA: Insufficient documentation

## 2018-03-28 DIAGNOSIS — E119 Type 2 diabetes mellitus without complications: Secondary | ICD-10-CM | POA: Insufficient documentation

## 2018-03-28 LAB — GLUCOSE, POCT (MANUAL RESULT ENTRY): POC GLUCOSE: 200 mg/dL — AB (ref 70–99)

## 2018-03-28 MED ORDER — LISINOPRIL 10 MG PO TABS: 10.0000 mg | ORAL_TABLET | Freq: Every day | ORAL | 3 refills | Status: DC

## 2018-03-28 MED FILL — LISINOPRIL 10 MG TABS: 10 | 30 days supply | Qty: 30 | Fill #0

## 2018-03-28 NOTE — Progress Notes (Signed)
Subjective:  Patient ID: Jacob [name], male    DOB: 1976/05/21  Age: 42 y.o. MRN: [id number]  CC: Diabetes and Hypertension   HPI Jacob [name] is a 42 year old male with a history of hypertension, hyperlipidemia, type 2 diabetes mellitus (A1c 12.7) who presents today for a follow-up visit after previous noncompliance with diabetic medications and antihypertensives. Since his last visit last month he has been compliant with his medications and his 7-day average on his glucometer is 164, his 30-day average is 162.  He has noticed some blurry vision and farsightedness ever since he resumed taking his diabetic medications again. He has been working on compliance with a diabetic diet and has cut out a lot of sweet drinks.  He does drink coke zero on some occasions. His blood pressure is elevated and he has been compliant with 5 mg of lisinopril. At his last visit he received treatment for tinea pedis and reports improvement in symptoms.   He has no additional concerns today.   Past Medical History:  Diagnosis Date  . Allergy   . Diabetes mellitus without complication (HCC)   . Hypertension     Past Surgical History:  Procedure Laterality Date  . APPENDECTOMY    . LAPAROSCOPIC APPENDECTOMY  10/24/2013   Procedure: APPENDECTOMY LAPAROSCOPIC;  Surgeon: Kandis Cocking, MD;  Location: [facility];  Service: General;;    No Known Allergies   Outpatient Medications Prior to Visit  Medication Sig Dispense Refill  . atorvastatin (LIPITOR) 20 MG tablet Take 1 tablet (20 mg total) by mouth daily. 30 tablet 3  . Blood Glucose Monitoring Suppl (TRUE METRIX METER) DEVI 1 each by Does not apply route 3 (three) times daily before meals. 1 Device 0  . glipiZIDE (GLUCOTROL) 5 MG tablet Take 1 tablet (5 mg total) by mouth 2 (two) times daily before a meal. 60 tablet 3  . glucose blood (TRUE METRIX BLOOD GLUCOSE TEST) test strip Use three times daily 100 each 12  . metFORMIN (GLUCOPHAGE) 500 MG tablet  Take 2 tablets (1,000 mg total) by mouth 2 (two) times daily with a meal. 120 tablet 3  . TRUEPLUS LANCETS 28G MISC 1 each by Does not apply route 3 (three) times daily before meals. 100 each 12  . lisinopril (PRINIVIL,ZESTRIL) 10 MG tablet Take 0.5 tablets (5 mg total) by mouth daily. 30 tablet 3  . clotrimazole (LOTRIMIN) 1 % cream Apply 1 application topically 2 (two) times daily. (Patient not taking: Reported on 03/28/2018) 30 g 1   No facility-administered medications prior to visit.     ROS Review of Systems  Constitutional: Negative for activity change and appetite change.  HENT: Negative for sinus pressure and sore throat.   Eyes: Negative for visual disturbance.  Respiratory: Negative for cough, chest tightness and shortness of breath.   Cardiovascular: Negative for chest pain and leg swelling.  Gastrointestinal: Negative for abdominal distention, abdominal pain, constipation and diarrhea.  Endocrine: Negative.   Genitourinary: Negative for dysuria.  Musculoskeletal: Negative for joint swelling and myalgias.  Skin: Negative for rash.  Allergic/Immunologic: Negative.   Neurological: Negative for weakness, light-headedness and numbness.  Psychiatric/Behavioral: Negative for dysphoric mood and suicidal ideas.    Objective:  BP (!) 160/110   Pulse 82   Temp 98.5 F (36.9 C) (Oral)   Ht 6\' 1"  (1.854 m)   Wt 234 lb 6.4 oz (106.3 kg)   SpO2 99%   BMI 30.93 kg/m   BP/Weight 03/28/2018 02/23/2018 01/29/2015  Systolic BP 160 150 162  Diastolic BP 110 110 93  Wt. (Lbs) 234.4 235 -  BMI 30.93 31 -      Physical Exam  Constitutional: He is oriented to person, place, and time. He appears well-developed and well-nourished.  Cardiovascular: Normal rate, normal heart sounds and intact distal pulses.  No murmur heard. Pulmonary/Chest: Effort normal and breath sounds normal. He has no wheezes. He has no rales. He exhibits no tenderness.  Abdominal: Soft. Bowel sounds are normal. He  exhibits no distension and no mass. There is no tenderness.  Musculoskeletal: Normal range of motion.  Neurological: He is alert and oriented to person, place, and time.  Skin: Skin is warm and dry.  Psychiatric: He has a normal mood and affect.    CMP Latest Ref Rng & Units 02/23/2018 10/26/2013 10/23/2013  Glucose 65 - 99 mg/dL 409(W) 119(J) 478(G)  BUN 6 - 24 mg/dL 11 13 11   Creatinine 0.76 - 1.27 mg/dL 9.56 2.13 0.86  Sodium 134 - 144 mmol/L 141 141 138  Potassium 3.5 - 5.2 mmol/L 3.9 3.6(L) 4.0  Chloride 96 - 106 mmol/L 100 100 99  CO2 20 - 29 mmol/L 24 30 28   Calcium 8.7 - 10.2 mg/dL 9.3 9.5 9.7  Total Protein 6.0 - 8.5 g/dL 7.2 7.9 8.1  Total Bilirubin 0.0 - 1.2 mg/dL 1.0 0.6 1.1  Alkaline Phos 39 - 117 IU/L 135(H) 102 120(H)  AST 0 - 40 IU/L 40 26 25  ALT 0 - 44 IU/L 26 15 21     Lipid Panel     Component Value Date/Time   CHOL 239 (H) 02/23/2018 1207   TRIG 355 (H) 02/23/2018 1207   HDL 36 (L) 02/23/2018 1207   CHOLHDL 6.6 (H) 02/23/2018 1207   LDLCALC 132 (H) 02/23/2018 1207    Lab Results  Component Value Date   HGBA1C 12.6 (A) 02/23/2018    Assessment & Plan:   1. Diabetes mellitus without complication (HCC) Uncontrolled with history of 12.6 Blood sugar log reveals improvement especially with the lifestyle changes he is making and so I will make no regimen changes. Advised visual symptoms are most likely due to the adjustments of his eyes to normoglycemia from previous hyperglycemia. Continue current regimen, diabetic diet, exercise and will repeat A1c at next visit in 3 months - POCT glucose (manual entry)  2. Essential hypertension Uncontrolled Increase lisinopril from 5 mg to 10 mg Counseled on blood pressure goal of less than 130/80, low-sodium, DASH diet, medication compliance, 150 minutes of moderate intensity exercise per week. Discussed medication compliance, adverse effects. - lisinopril (PRINIVIL,ZESTRIL) 10 MG tablet; Take 1 tablet (10 mg total) by  mouth daily.  Dispense: 30 tablet; Refill: 3  3. Mixed hyperlipidemia Uncontrolled previously due to noncompliance with Lipitor Hopefully now that he is more compliant his lipids should improve   Meds ordered this encounter  Medications  . lisinopril (PRINIVIL,ZESTRIL) 10 MG tablet    Sig: Take 1 tablet (10 mg total) by mouth daily.    Dispense:  30 tablet    Refill:  3    Discontinue previous dose    Follow-up: Return in about 3 months (around 06/28/2018) for Follow-up of chronic medical conditions.   Hoy Register MD

## 2018-03-28 NOTE — Patient Instructions (Signed)
Diabetes Mellitus and Nutrition When you have diabetes (diabetes mellitus), it is very important to have healthy eating habits because your blood sugar (glucose) levels are greatly affected by what you eat and drink. Eating healthy foods in the appropriate amounts, at about the same times every day, can help you:  Control your blood glucose.  Lower your risk of heart disease.  Improve your blood pressure.  Reach or maintain a healthy weight.  Every person with diabetes is different, and each person has different needs for a meal plan. Your health care provider may recommend that you work with a diet and nutrition specialist (dietitian) to make a meal plan that is best for you. Your meal plan may vary depending on factors such as:  The calories you need.  The medicines you take.  Your weight.  Your blood glucose, blood pressure, and cholesterol levels.  Your activity level.  Other health conditions you have, such as heart or kidney disease.  How do carbohydrates affect me? Carbohydrates affect your blood glucose level more than any other type of food. Eating carbohydrates naturally increases the amount of glucose in your blood. Carbohydrate counting is a method for keeping track of how many carbohydrates you eat. Counting carbohydrates is important to keep your blood glucose at a healthy level, especially if you use insulin or take certain oral diabetes medicines. It is important to know how many carbohydrates you can safely have in each meal. This is different for every person. Your dietitian can help you calculate how many carbohydrates you should have at each meal and for snack. Foods that contain carbohydrates include:  Bread, cereal, rice, pasta, and crackers.  Potatoes and corn.  Peas, beans, and lentils.  Milk and yogurt.  Fruit and juice.  Desserts, such as cakes, cookies, ice cream, and candy.  How does alcohol affect me? Alcohol can cause a sudden decrease in blood  glucose (hypoglycemia), especially if you use insulin or take certain oral diabetes medicines. Hypoglycemia can be a life-threatening condition. Symptoms of hypoglycemia (sleepiness, dizziness, and confusion) are similar to symptoms of having too much alcohol. If your health care provider says that alcohol is safe for you, follow these guidelines:  Limit alcohol intake to no more than 1 drink per day for nonpregnant women and 2 drinks per day for men. One drink equals 12 oz of beer, 5 oz of wine, or 1 oz of hard liquor.  Do not drink on an empty stomach.  Keep yourself hydrated with water, diet soda, or unsweetened iced tea.  Keep in mind that regular soda, juice, and other mixers may contain a lot of sugar and must be counted as carbohydrates.  What are tips for following this plan? Reading food labels  Start by checking the serving size on the label. The amount of calories, carbohydrates, fats, and other nutrients listed on the label are based on one serving of the food. Many foods contain more than one serving per package.  Check the total grams (g) of carbohydrates in one serving. You can calculate the number of servings of carbohydrates in one serving by dividing the total carbohydrates by 15. For example, if a food has 30 g of total carbohydrates, it would be equal to 2 servings of carbohydrates.  Check the number of grams (g) of saturated and trans fats in one serving. Choose foods that have low or no amount of these fats.  Check the number of milligrams (mg) of sodium in one serving. Most people   should limit total sodium intake to less than 2,300 mg per day.  Always check the nutrition information of foods labeled as "low-fat" or "nonfat". These foods may be higher in added sugar or refined carbohydrates and should be avoided.  Talk to your dietitian to identify your daily goals for nutrients listed on the label. Shopping  Avoid buying canned, premade, or processed foods. These  foods tend to be high in fat, sodium, and added sugar.  Shop around the outside edge of the grocery store. This includes fresh fruits and vegetables, bulk grains, fresh meats, and fresh dairy. Cooking  Use low-heat cooking methods, such as baking, instead of high-heat cooking methods like deep frying.  Cook using healthy oils, such as olive, canola, or sunflower oil.  Avoid cooking with butter, cream, or high-fat meats. Meal planning  Eat meals and snacks regularly, preferably at the same times every day. Avoid going long periods of time without eating.  Eat foods high in fiber, such as fresh fruits, vegetables, beans, and whole grains. Talk to your dietitian about how many servings of carbohydrates you can eat at each meal.  Eat 4-6 ounces of lean protein each day, such as lean meat, chicken, fish, eggs, or tofu. 1 ounce is equal to 1 ounce of meat, chicken, or fish, 1 egg, or 1/4 cup of tofu.  Eat some foods each day that contain healthy fats, such as avocado, nuts, seeds, and fish. Lifestyle   Check your blood glucose regularly.  Exercise at least 30 minutes 5 or more days each week, or as told by your health care provider.  Take medicines as told by your health care provider.  Do not use any products that contain nicotine or tobacco, such as cigarettes and e-cigarettes. If you need help quitting, ask your health care provider.  Work with a counselor or diabetes educator to identify strategies to manage stress and any emotional and social challenges. What are some questions to ask my health care provider?  Do I need to meet with a diabetes educator?  Do I need to meet with a dietitian?  What number can I call if I have questions?  When are the best times to check my blood glucose? Where to find more information:  American Diabetes Association: diabetes.org/food-and-fitness/food  Academy of Nutrition and Dietetics:  www.eatright.org/resources/health/diseases-and-conditions/diabetes  National Institute of Diabetes and Digestive and Kidney Diseases (NIH): www.niddk.nih.gov/health-information/diabetes/overview/diet-eating-physical-activity Summary  A healthy meal plan will help you control your blood glucose and maintain a healthy lifestyle.  Working with a diet and nutrition specialist (dietitian) can help you make a meal plan that is best for you.  Keep in mind that carbohydrates and alcohol have immediate effects on your blood glucose levels. It is important to count carbohydrates and to use alcohol carefully. This information is not intended to replace advice given to you by your health care provider. Make sure you discuss any questions you have with your health care provider. Document Released: 02/05/2005 Document Revised: 06/15/2016 Document Reviewed: 06/15/2016 Elsevier Interactive Patient Education  2018 Elsevier Inc.  

## 2018-03-31 MED FILL — ATORVASTATIN CALCIUM 20 MG: 20 | 30 days supply | Qty: 30 | Fill #1

## 2018-05-04 MED FILL — glipiZIDE 5 MG TABS: 5 | 30 days supply | Qty: 60 | Fill #1

## 2018-05-26 MED FILL — ATORVASTATIN 20 MG TABLET: 20 | 30 days supply | Qty: 30 | Fill #2

## 2018-05-26 MED FILL — LISINOPRIL 10 MG TABS: 10 | 30 days supply | Qty: 30 | Fill #1

## 2018-06-13 DIAGNOSIS — E119 Type 2 diabetes mellitus without complications: Secondary | ICD-10-CM | POA: Diagnosis not present

## 2018-06-28 ENCOUNTER — Encounter: Payer: Self-pay | Admitting: Family Medicine

## 2018-06-28 ENCOUNTER — Ambulatory Visit: Payer: BLUE CROSS/BLUE SHIELD | Attending: Family Medicine | Admitting: Family Medicine

## 2018-06-28 VITALS — BP 162/115 | HR 76 | Temp 98.2°F | Ht 73.0 in | Wt 237.8 lb

## 2018-06-28 DIAGNOSIS — Z1159 Encounter for screening for other viral diseases: Secondary | ICD-10-CM

## 2018-06-28 DIAGNOSIS — E119 Type 2 diabetes mellitus without complications: Secondary | ICD-10-CM | POA: Diagnosis not present

## 2018-06-28 DIAGNOSIS — I10 Essential (primary) hypertension: Secondary | ICD-10-CM | POA: Diagnosis not present

## 2018-06-28 DIAGNOSIS — E782 Mixed hyperlipidemia: Secondary | ICD-10-CM

## 2018-06-28 LAB — GLUCOSE, POCT (MANUAL RESULT ENTRY): POC GLUCOSE: 184 mg/dL — AB (ref 70–99)

## 2018-06-28 LAB — POCT GLYCOSYLATED HEMOGLOBIN (HGB A1C): HBA1C, POC (CONTROLLED DIABETIC RANGE): 8.7 % — AB (ref 0.0–7.0)

## 2018-06-28 MED ORDER — LISINOPRIL 10 MG PO TABS: 10.0000 mg | ORAL_TABLET | Freq: Every day | ORAL | 3 refills | Status: DC

## 2018-06-28 MED ORDER — GLIPIZIDE 10 MG PO TABS: 10.0000 mg | ORAL_TABLET | Freq: Two times a day (BID) | ORAL | 3 refills | Status: DC

## 2018-06-28 MED ORDER — ATORVASTATIN CALCIUM 20 MG PO TABS: 20.0000 mg | ORAL_TABLET | Freq: Every day | ORAL | 3 refills | Status: DC

## 2018-06-28 MED ORDER — METFORMIN HCL 500 MG PO TABS: 1000.0000 mg | ORAL_TABLET | Freq: Two times a day (BID) | ORAL | 3 refills | Status: DC

## 2018-06-28 NOTE — Progress Notes (Signed)
Subjective:  Patient ID: Jacob [name], male    DOB: 08-24-1975  Age: 43 y.o. MRN: [id number]  CC: Diabetes   HPI ASTOR GENTLE is a 43 year old male with a history of hypertension, hyperlipidemia, type 2 diabetes mellitus (A1c 8.7) who presents today for a follow-up visit after previous noncompliance with diabetic medications and antihypertensives. He has no complaints today. His blood pressure is elevated and he is yet to take his antihypertensive today. Endorses compliance with his diabetic medications however he has not been compliant with a diabetic diet especially over the Thanksgiving and Christmas holidays.  He had his eye exam last month with no evidence of retinopathy.  Denies numbness in extremities, hypoglycemia. Compliant with his statin.  Past Medical History:  Diagnosis Date  . Allergy   . Diabetes mellitus without complication ([place])   . Hypertension     Past Surgical History:  Procedure Laterality Date  . APPENDECTOMY    . LAPAROSCOPIC APPENDECTOMY  10/24/2013   Procedure: APPENDECTOMY LAPAROSCOPIC;  Surgeon: Shann Medal, MD;  Location: [facility];  Service: General;;    No Known Allergies   Outpatient Medications Prior to Visit  Medication Sig Dispense Refill  . Blood Glucose Monitoring Suppl (TRUE METRIX METER) DEVI 1 each by Does not apply route 3 (three) times daily before meals. 1 Device 0  . clotrimazole (LOTRIMIN) 1 % cream Apply 1 application topically 2 (two) times daily. 30 g 1  . glucose blood (TRUE METRIX BLOOD GLUCOSE TEST) test strip Use three times daily 100 each 12  . TRUEPLUS LANCETS 28G MISC 1 each by Does not apply route 3 (three) times daily before meals. 100 each 12  . atorvastatin (LIPITOR) 20 MG tablet Take 1 tablet (20 mg total) by mouth daily. 30 tablet 3  . glipiZIDE (GLUCOTROL) 5 MG tablet Take 1 tablet (5 mg total) by mouth 2 (two) times daily before a meal. 60 tablet 3  . lisinopril (PRINIVIL,ZESTRIL) 10 MG tablet Take 1 tablet (10  mg total) by mouth daily. 30 tablet 3  . metFORMIN (GLUCOPHAGE) 500 MG tablet Take 2 tablets (1,000 mg total) by mouth 2 (two) times daily with a meal. 120 tablet 3   No facility-administered medications prior to visit.     ROS Review of Systems  Constitutional: Negative for activity change and appetite change.  HENT: Negative for sinus pressure and sore throat.   Eyes: Negative for visual disturbance.  Respiratory: Negative for cough, chest tightness and shortness of breath.   Cardiovascular: Negative for chest pain and leg swelling.  Gastrointestinal: Negative for abdominal distention, abdominal pain, constipation and diarrhea.  Endocrine: Negative.   Genitourinary: Negative for dysuria.  Musculoskeletal: Negative for joint swelling and myalgias.  Skin: Negative for rash.  Allergic/Immunologic: Negative.   Neurological: Negative for weakness, light-headedness and numbness.  Psychiatric/Behavioral: Negative for dysphoric mood and suicidal ideas.    Objective:  BP (!) 162/115   Pulse 76   Temp 98.2 F (36.8 C) (Oral)   Ht 6' 1"  (1.854 m)   Wt 237 lb 12.8 oz (107.9 kg)   SpO2 97%   BMI 31.37 kg/m   BP/Weight 06/28/2018 03/28/2018 83/08/1960  Systolic BP 229 798 921  Diastolic BP 194 174 081  Wt. (Lbs) 237.8 234.4 235  BMI 31.37 30.93 31      Physical Exam Constitutional:      Appearance: He is well-developed.  Cardiovascular:     Rate and Rhythm: Normal rate.  Heart sounds: Normal heart sounds. No murmur.  Pulmonary:     Effort: Pulmonary effort is normal.     Breath sounds: Normal breath sounds. No wheezing or rales.  Chest:     Chest wall: No tenderness.  Abdominal:     General: Bowel sounds are normal. There is no distension.     Palpations: Abdomen is soft. There is no mass.     Tenderness: There is no abdominal tenderness.  Musculoskeletal: Normal range of motion.  Neurological:     Mental Status: He is alert and oriented to person, place, and time.    Psychiatric:        Mood and Affect: Mood normal.        Behavior: Behavior normal.     CMP Latest Ref Rng & Units 02/23/2018 10/26/2013 10/23/2013  Glucose 65 - 99 mg/dL 304(H) 148(H) 126(H)  BUN 6 - 24 mg/dL 11 13 11   Creatinine 0.76 - 1.27 mg/dL 0.98 0.77 1.04  Sodium 134 - 144 mmol/L 141 141 138  Potassium 3.5 - 5.2 mmol/L 3.9 3.6(L) 4.0  Chloride 96 - 106 mmol/L 100 100 99  CO2 20 - 29 mmol/L 24 30 28   Calcium 8.7 - 10.2 mg/dL 9.3 9.5 9.7  Total Protein 6.0 - 8.5 g/dL 7.2 7.9 8.1  Total Bilirubin 0.0 - 1.2 mg/dL 1.0 0.6 1.1  Alkaline Phos 39 - 117 IU/L 135(H) 102 120(H)  AST 0 - 40 IU/L 40 26 25  ALT 0 - 44 IU/L 26 15 21     Lipid Panel     Component Value Date/Time   CHOL 239 (H) 02/23/2018 1207   TRIG 355 (H) 02/23/2018 1207   HDL 36 (L) 02/23/2018 1207   CHOLHDL 6.6 (H) 02/23/2018 1207   LDLCALC 132 (H) 02/23/2018 1207    Lab Results  Component Value Date   HGBA1C 8.7 (A) 06/28/2018      Assessment & Plan:   1. Diabetes mellitus without complication ([place]) Uncontrolled with A1c of 8.7 Increase glipizide to 10 mg Counseled on Diabetic diet, my plate method, 706 minutes of moderate intensity exercise/week Keep blood sugar logs with fasting goals of 80-120 mg/dl, random of less than 180 and in the event of sugars less than 60 mg/dl or greater than 400 mg/dl please notify the clinic ASAP. It is recommended that you undergo annual eye exams and annual foot exams. Pneumonia vaccine is recommended. - POCT glucose (manual entry) - POCT glycosylated hemoglobin (Hb A1C) - glipiZIDE (GLUCOTROL) 10 MG tablet; Take 1 tablet (10 mg total) by mouth 2 (two) times daily before a meal.  Dispense: 60 tablet; Refill: 3 - CMP14+EGFR - Lipid panel - Microalbumin/Creatinine Ratio, Urine - atorvastatin (LIPITOR) 20 MG tablet; Take 1 tablet (20 mg total) by mouth daily.  Dispense: 30 tablet; Refill: 3 - metFORMIN (GLUCOPHAGE) 500 MG tablet; Take 2 tablets (1,000 mg total) by mouth 2  (two) times daily with a meal.  Dispense: 120 tablet; Refill: 3  2. Essential hypertension Uncontrolled Yet to take antihypertensive today - lisinopril (PRINIVIL,ZESTRIL) 10 MG tablet; Take 1 tablet (10 mg total) by mouth daily.  Dispense: 30 tablet; Refill: 3  3. Mixed hyperlipidemia Uncontrolled Lipid panel today and will adjust regimen accordingly  4. Screening for viral disease - HIV Antibody (routine testing w rflx)   Meds ordered this encounter  Medications  . glipiZIDE (GLUCOTROL) 10 MG tablet    Sig: Take 1 tablet (10 mg total) by mouth 2 (two) times daily before a  meal.    Dispense:  60 tablet    Refill:  3  . atorvastatin (LIPITOR) 20 MG tablet    Sig: Take 1 tablet (20 mg total) by mouth daily.    Dispense:  30 tablet    Refill:  3  . lisinopril (PRINIVIL,ZESTRIL) 10 MG tablet    Sig: Take 1 tablet (10 mg total) by mouth daily.    Dispense:  30 tablet    Refill:  3    Discontinue previous dose  . metFORMIN (GLUCOPHAGE) 500 MG tablet    Sig: Take 2 tablets (1,000 mg total) by mouth 2 (two) times daily with a meal.    Dispense:  120 tablet    Refill:  3    Follow-up: Return in about 3 months (around 09/26/2018) for Follow-up of chronic medical conditions.   Charlott Rakes MD

## 2018-06-28 NOTE — Patient Instructions (Signed)
High Triglycerides Eating Plan  Triglycerides are a type of fat in the blood. High levels of triglycerides can increase your risk of heart disease and stroke. If your triglyceride levels are high, choosing the right foods can help lower your triglycerides and keep your heart healthy. Work with your health care provider or a diet and nutrition specialist (dietitian) to develop an eating plan that is right for you.  What are tips for following this plan?  General guidelines    · Lose weight, if you are overweight. For most people, losing 5-10 lbs (2-5 kg) helps lower triglyceride levels. A weight-loss plan may include.  ? 30 minutes of exercise at least 5 days a week.  ? Reducing the amount of calories, sugar, and fat you eat.  · Eat a wide variety of fresh fruits, vegetables, and whole grains. These foods are high in fiber.  · Eat foods that contain healthy fats, such as fatty fish, nuts, seeds, and olive oil.  · Avoid foods that are high in added sugar, added salt (sodium), saturated fat, and trans fat.  · Avoid low-fiber, refined carbohydrates such as white bread, crackers, noodles, and white rice.  · Avoid foods with partially hydrogenated oils (trans fats), such as fried foods or stick margarine.  · Limit alcohol intake to no more than 1 drink a day for nonpregnant women and 2 drinks a day for men. One drink equals 12 oz of beer, 5 oz of wine, or 1½ oz of hard liquor. Your health care provider may recommend that you drink less depending on your overall health.  Reading food labels  · Check food labels for the amount of saturated fat. Choose foods with no or very little saturated fat.  · Check food labels for the amount of trans fat. Choose foods with no trans fat.  · Check food labels for the amount of cholesterol. Choose foods low in cholesterol. Ask your dietitian how much cholesterol you should have each day.  · Check food labels for the amount of sodium. Choose foods with less than 140 milligrams (mg) per  serving.  Shopping  · Buy dairy products labeled as nonfat (skim) or low-fat (1%).  · Avoid buying processed or prepackaged foods. These are often high in added sugar, sodium, and fat.  Cooking  · Choose healthy fats when cooking, such as olive oil or canola oil.  · Cook foods using lower fat methods, such as baking, broiling, boiling, or grilling.  · Make your own sauces, dressings, and marinades when possible, instead of buying them. Store-bought sauces, dressings, and marinades are often high in sodium and sugar.  Meal planning  · Eat more home-cooked food and less restaurant, buffet, and fast food.  · Eat fatty fish at least 2 times each week. Examples of fatty fish include salmon, trout, mackerel, tuna, and herring.  · If you eat whole eggs, do not eat more than 3 egg yolks per week.  What foods are recommended?  The items listed may not be a complete list. Talk with your dietitian about what dietary choices are best for you.  Grains  Whole wheat or whole grain breads, crackers, cereals, and pasta. Unsweetened oatmeal. Bulgur. Barley. Quinoa. Brown rice. Whole wheat flour tortillas.  Vegetables  Fresh or frozen vegetables. Low-sodium canned vegetables.  Fruits  All fresh, canned (in natural juice), or frozen fruits.  Meats and other protein foods  Skinless chicken or turkey. Ground chicken or turkey. Lean cuts of pork, trimmed of   fat. Fish and seafood, especially salmon, trout, and herring. Egg whites. Dried beans, peas, or lentils. Unsalted nuts or seeds. Unsalted canned beans. Natural peanut or almond butter.  Dairy  Low-fat dairy products. Skim or low-fat (1%) milk. Reduced fat (2%) and low-sodium cheese. Low-fat ricotta cheese. Low-fat cottage cheese. Plain, low-fat yogurt.  Fats and oils  Tub margarine without trans fats. Light or reduced-fat mayonnaise. Light or reduced-fat salad dressings. Avocado. Safflower, olive, sunflower, soybean, and canola oils.  What foods are not recommended?  The items listed  may not be a complete list. Talk with your dietitian about what dietary choices are best for you.  Grains  White bread. White (regular) pasta. White rice. Cornbread. Bagels. Pastries. Crackers that contain trans fat.  Vegetables  Creamed or fried vegetables. Vegetables in a cheese sauce.  Fruits  Sweetened dried fruit. Canned fruit in syrup. Fruit juice.  Meats and other protein foods  Fatty cuts of meat. Ribs. Chicken wings. Bacon. Sausage. Bologna. Salami. Chitterlings. Fatback. Hot dogs. Bratwurst. Packaged lunch meats.  Dairy  Whole or reduced-fat (2%) milk. Half-and-half. Cream cheese. Full-fat or sweetened yogurt. Full-fat cheese. Nondairy creamers. Whipped toppings. Processed cheese or cheese spreads. Cheese curds.  Beverages  Alcohol. Sweetened drinks, such as soda, lemonade, fruit drinks, or punches.  Fats and oils  Butter. Stick margarine. Lard. Shortening. Ghee. Bacon fat. Tropical oils, such as coconut, palm kernel, or palm oils.  Sweets and desserts  Corn syrup. Sugars. Honey. Molasses. Candy. Jam and jelly. Syrup. Sweetened cereals. Cookies. Pies. Cakes. Donuts. Muffins. Ice cream.  Condiments  Store-bought sauces, dressings, and marinades that are high in sugar, such as ketchup and barbecue sauce.  Summary  · High levels of triglycerides can increase the risk of heart disease and stroke. Choosing the right foods can help lower your triglycerides.  · Eat plenty of fresh fruits, vegetables, and whole grains. Choose low-fat dairy and lean meats. Eat fatty fish at least twice a week.  · Avoid processed and prepackaged foods with added sugar, sodium, saturated fat, and trans fat.  · If you need suggestions or have questions about what types of food are good for you, talk with your health care provider or a dietitian.  This information is not intended to replace advice given to you by your health care provider. Make sure you discuss any questions you have with your health care provider.  Document Released:  02/27/2004 Document Revised: 07/14/2016 Document Reviewed: 07/14/2016  Elsevier Interactive Patient Education © 2019 [employer]

## 2018-06-29 LAB — CMP14+EGFR
ALBUMIN: 4.3 g/dL (ref 4.0–5.0)
ALK PHOS: 107 IU/L (ref 39–117)
ALT: 15 IU/L (ref 0–44)
AST: 29 IU/L (ref 0–40)
Albumin/Globulin Ratio: 1.5 (ref 1.2–2.2)
BILIRUBIN TOTAL: 1 mg/dL (ref 0.0–1.2)
BUN / CREAT RATIO: 16 (ref 9–20)
BUN: 16 mg/dL (ref 6–24)
CHLORIDE: 102 mmol/L (ref 96–106)
CO2: 25 mmol/L (ref 20–29)
CREATININE: 1.03 mg/dL (ref 0.76–1.27)
Calcium: 9.7 mg/dL (ref 8.7–10.2)
GFR calc non Af Amer: 88 mL/min/{1.73_m2} (ref >59)
GFR, EST AFRICAN AMERICAN: 102 mL/min/{1.73_m2} (ref >59)
GLUCOSE: 180 mg/dL — AB (ref 65–99)
Globulin, Total: 2.9 g/dL (ref 1.5–4.5)
Potassium: 4.4 mmol/L (ref 3.5–5.2)
Sodium: 142 mmol/L (ref 134–144)
TOTAL PROTEIN: 7.2 g/dL (ref 6.0–8.5)

## 2018-06-29 LAB — SPECIMEN STATUS REPORT

## 2018-06-29 LAB — MICROALBUMIN / CREATININE URINE RATIO
Creatinine, Urine: 269.9 mg/dL
MICROALBUM., U, RANDOM: 94.8 ug/mL
Microalb/Creat Ratio: 35 mg/g creat — ABNORMAL HIGH (ref 0–29)

## 2018-06-29 LAB — LIPID PANEL
CHOLESTEROL TOTAL: 171 mg/dL (ref 100–199)
Chol/HDL Ratio: 3.4 ratio (ref 0.0–5.0)
HDL: 51 mg/dL (ref >39)
LDL CALC: 94 mg/dL (ref 0–99)
Triglycerides: 131 mg/dL (ref 0–149)
VLDL CHOLESTEROL CAL: 26 mg/dL (ref 5–40)

## 2018-07-01 ENCOUNTER — Telehealth: Payer: Self-pay

## 2018-07-01 NOTE — Telephone Encounter (Signed)
Patient was called and informed of lab results. 

## 2018-07-01 NOTE — Telephone Encounter (Signed)
-----   Message from Hoy Register, MD sent at 06/30/2018  1:59 PM EST ----- Please inform the patient that labs are normal. Thank you.

## 2018-07-01 NOTE — Telephone Encounter (Signed)
Patient called back to get their lab results please follow up.

## 2018-07-01 NOTE — Telephone Encounter (Signed)
Patient was called and a voicemail was left informing patient to contact office for lab results. 

## 2018-08-01 MED FILL — metFORMIN HCL 500 MG TABS: 500 | 30 days supply | Qty: 120 | Fill #0

## 2018-08-01 MED FILL — LISINOPRIL 10 MG TABS: 10 | 30 days supply | Qty: 30 | Fill #2

## 2018-08-01 MED FILL — ATORVASTATIN 20 MG TABLET: 20 | 30 days supply | Qty: 30 | Fill #0

## 2018-08-01 MED FILL — glipiZIDE 10 MG TABS: 10 | 30 days supply | Qty: 60 | Fill #0

## 2018-09-26 ENCOUNTER — Encounter: Payer: Self-pay | Admitting: Family Medicine

## 2018-09-26 ENCOUNTER — Ambulatory Visit: Payer: BLUE CROSS/BLUE SHIELD | Attending: Family Medicine | Admitting: Family Medicine

## 2018-09-26 ENCOUNTER — Other Ambulatory Visit: Payer: Self-pay

## 2018-09-26 DIAGNOSIS — I10 Essential (primary) hypertension: Secondary | ICD-10-CM

## 2018-09-26 DIAGNOSIS — E119 Type 2 diabetes mellitus without complications: Secondary | ICD-10-CM

## 2018-09-26 MED ORDER — ATORVASTATIN CALCIUM 20 MG PO TABS: 20.0000 mg | ORAL_TABLET | Freq: Every day | ORAL | 1 refills | Status: DC

## 2018-09-26 MED ORDER — METFORMIN HCL 500 MG PO TABS: 1000.0000 mg | ORAL_TABLET | Freq: Two times a day (BID) | ORAL | 1 refills | Status: DC

## 2018-09-26 MED ORDER — GLIPIZIDE 10 MG PO TABS: 10.0000 mg | ORAL_TABLET | Freq: Two times a day (BID) | ORAL | 1 refills | Status: DC

## 2018-09-26 MED ORDER — LISINOPRIL 10 MG PO TABS: 10.0000 mg | ORAL_TABLET | Freq: Every day | ORAL | 1 refills | Status: DC

## 2018-09-26 MED FILL — LISINOPRIL 10 MG TABS: 10 | 30 days supply | Qty: 30 | Fill #3

## 2018-09-26 MED FILL — glipiZIDE 10 MG TABS: 10 | 30 days supply | Qty: 60 | Fill #1

## 2018-09-26 MED FILL — ATORVASTATIN 20 MG TABLET: 20 | 30 days supply | Qty: 30 | Fill #3

## 2018-09-26 MED FILL — metFORMIN HCL 500 MG TABS: 500 | 30 days supply | Qty: 120 | Fill #1

## 2018-09-26 NOTE — Progress Notes (Signed)
Virtual Visit via Telephone Note  I connected with Jacob [name], on 09/26/2018 at 11:30 AM by telephone and verified that I am speaking with the correct person using two identifiers.   Consent: I discussed the limitations, risks, security and privacy concerns of performing an evaluation and management service by telephone and the availability of in person appointments. I also discussed with the patient that there may be a patient responsible charge related to this service. The patient expressed understanding and agreed to proceed.   Location of Patient: Home  Location of Provider: Clinic   Persons participating in Telemedicine visit: Arzell A Andrey Spearman Farrington-CMA Dr. Nelwyn Salisbury     History of Present Illness: Jacob [name] is a 43 year old male with a history of hypertension, hyperlipidemia, type 2 diabetes mellitus (A1c 8.7 from 06/2018) who is seen today for follow-up visit. He has no complaints today. His random sugars have been around 200 and he denies hypoglycemia with his lowest sugars at 80.  Denies numbness in extremities or visual concerns and gets a lot of exercise at his job.  Compliant with his statin and antihypertensive and denies adverse effects from his medications. Denies chest pain, pedal edema, dyspnea.  Past Medical History:  Diagnosis Date  . Allergy   . Diabetes mellitus without complication (HCC)   . Hypertension    No Known Allergies  Current Outpatient Medications on File Prior to Visit  Medication Sig Dispense Refill  . atorvastatin (LIPITOR) 20 MG tablet Take 1 tablet (20 mg total) by mouth daily. 30 tablet 3  . Blood Glucose Monitoring Suppl (TRUE METRIX METER) DEVI 1 each by Does not apply route 3 (three) times daily before meals. 1 Device 0  . clotrimazole (LOTRIMIN) 1 % cream Apply 1 application topically 2 (two) times daily. 30 g 1  . glipiZIDE (GLUCOTROL) 10 MG tablet Take 1 tablet (10 mg total) by mouth 2 (two) times daily before a  meal. 60 tablet 3  . glucose blood (TRUE METRIX BLOOD GLUCOSE TEST) test strip Use three times daily 100 each 12  . lisinopril (PRINIVIL,ZESTRIL) 10 MG tablet Take 1 tablet (10 mg total) by mouth daily. 30 tablet 3  . metFORMIN (GLUCOPHAGE) 500 MG tablet Take 2 tablets (1,000 mg total) by mouth 2 (two) times daily with a meal. 120 tablet 3  . TRUEPLUS LANCETS 28G MISC 1 each by Does not apply route 3 (three) times daily before meals. 100 each 12   No current facility-administered medications on file prior to visit.     Observations/Objective: Awake, alert, oriented x3 Not in acute distress  CMP Latest Ref Rng & Units 06/28/2018 02/23/2018 10/26/2013  Glucose 65 - 99 mg/dL 053(Z) 767(H) 419(F)  BUN 6 - 24 mg/dL 16 11 13   Creatinine 0.76 - 1.27 mg/dL 7.90 2.40 9.73  Sodium 134 - 144 mmol/L 142 141 141  Potassium 3.5 - 5.2 mmol/L 4.4 3.9 3.6(L)  Chloride 96 - 106 mmol/L 102 100 100  CO2 20 - 29 mmol/L 25 24 30   Calcium 8.7 - 10.2 mg/dL 9.7 9.3 9.5  Total Protein 6.0 - 8.5 g/dL 7.2 7.2 7.9  Total Bilirubin 0.0 - 1.2 mg/dL 1.0 1.0 0.6  Alkaline Phos 39 - 117 IU/L 107 135(H) 102  AST 0 - 40 IU/L 29 40 26  ALT 0 - 44 IU/L 15 26 15     Lipid Panel     Component Value Date/Time   CHOL 171 06/28/2018 1240   TRIG 131 06/28/2018 1240  HDL 51 06/28/2018 1240   CHOLHDL 3.4 06/28/2018 1240   LDLCALC 94 06/28/2018 1240    Lab Results  Component Value Date   HGBA1C 8.7 (A) 06/28/2018    The 10-year ASCVD risk score Denman Jacob([name] DC Jr., et al., 2013) is: 18.2%   Values used to calculate the score:     Age: [age] years     Sex: Male     Is Non-Hispanic African American: Yes     Diabetic: Yes     Tobacco smoker: No     Systolic Blood Pressure: 162 mmHg     Is BP treated: Yes     HDL Cholesterol: 51 mg/dL     Total Cholesterol: 171 mg/dL  Assessment and Plan: 1. Diabetes mellitus without complication (HCC) Uncontrolled with A1c of 8.7 Blood sugar log reveals improvement No regimen change  today, next A1c due at Present visit in 3 months Counseled on Diabetic diet, my plate method, [id number] minutes of moderate intensity exercise/week Keep blood sugar logs with fasting goals of 80-120 mg/dl, random of less than 096180 and in the event of sugars less than 60 mg/dl or greater than 045400 mg/dl please notify the clinic ASAP. It is recommended that you undergo annual eye exams and annual foot exams. Pneumonia vaccine is recommended. - metFORMIN (GLUCOPHAGE) 500 MG tablet; Take 2 tablets (1,000 mg total) by mouth 2 (two) times daily with a meal.  Dispense: 360 tablet; Refill: 1 - glipiZIDE (GLUCOTROL) 10 MG tablet; Take 1 tablet (10 mg total) by mouth 2 (two) times daily before a meal.  Dispense: 180 tablet; Refill: 1 - atorvastatin (LIPITOR) 20 MG tablet; Take 1 tablet (20 mg total) by mouth daily.  Dispense: 90 tablet; Refill: 1  2. Essential hypertension Last blood pressure was elevated at 162/115 Advised to keep a log of blood pressures Counseled on blood pressure goal of less than 130/80, low-sodium, DASH diet, medication compliance, 150 minutes of moderate intensity exercise per week. Discussed medication compliance, adverse effects. - lisinopril (ZESTRIL) 10 MG tablet; Take 1 tablet (10 mg total) by mouth daily.  Dispense: 90 tablet; Refill: 1   Follow Up Instructions: Return in about 3 months (around 12/27/2018).    I discussed the assessment and treatment plan with the patient. The patient was provided an opportunity to ask questions and all were answered. The patient agreed with the plan and demonstrated an understanding of the instructions.   The patient was advised to call back or seek an in-person evaluation if the symptoms worsen or if the condition fails to improve as anticipated.     I provided 15 minutes total of non-face-to-face time during this encounter including median intraservice time, reviewing previous notes, labs, imaging, medications and explaining diagnosis and  management.     Hoy RegisterEnobong Slyvia Lartigue, MD, FAAFP. [facility] [place], [place] [phone]   09/26/2018, 11:30 AM

## 2018-09-26 NOTE — Progress Notes (Signed)
Patient has been called and DOB has been verified. Patient has been screened and transferred to PCP to start phone visit.  C/C:diabetes, hypertension   REFILLS: lisinopril, lipitor.

## 2018-12-28 MED FILL — glipiZIDE 10 MG TABS: 10 | 30 days supply | Qty: 60 | Fill #2

## 2018-12-28 MED FILL — metFORMIN HCL 500 MG TABS: 500 | 30 days supply | Qty: 120 | Fill #2

## 2018-12-28 MED FILL — LISINOPRIL 10 MG TABS: 10 | 30 days supply | Qty: 30 | Fill #0

## 2018-12-28 MED FILL — ATORVASTATIN 20 MG TABLET: 20 | 30 days supply | Qty: 30 | Fill #0

## 2019-03-01 MED FILL — ATORVASTATIN CALCIUM 20 MG: 20 | 30 days supply | Qty: 30 | Fill #1

## 2019-03-01 MED FILL — LISINOPRIL 10 MG TABS: 10 | 30 days supply | Qty: 30 | Fill #1

## 2019-03-01 MED FILL — metFORMIN HCL 500 MG TABS: 500 | 30 days supply | Qty: 120 | Fill #3

## 2019-03-01 MED FILL — glipiZIDE 10 MG TABS: 10 | 30 days supply | Qty: 60 | Fill #3

## 2019-06-21 ENCOUNTER — Ambulatory Visit: Payer: BC Managed Care – PPO | Attending: Family Medicine | Admitting: Family Medicine

## 2019-06-21 ENCOUNTER — Other Ambulatory Visit: Payer: Self-pay

## 2019-06-21 DIAGNOSIS — R062 Wheezing: Secondary | ICD-10-CM

## 2019-06-21 DIAGNOSIS — I10 Essential (primary) hypertension: Secondary | ICD-10-CM | POA: Diagnosis not present

## 2019-06-21 DIAGNOSIS — E119 Type 2 diabetes mellitus without complications: Secondary | ICD-10-CM

## 2019-06-21 DIAGNOSIS — R05 Cough: Secondary | ICD-10-CM

## 2019-06-21 DIAGNOSIS — R0789 Other chest pain: Secondary | ICD-10-CM

## 2019-06-21 DIAGNOSIS — Z20822 Contact with and (suspected) exposure to covid-19: Secondary | ICD-10-CM

## 2019-06-21 MED ORDER — BENZONATATE 100 MG PO CAPS: 100.0000 mg | ORAL_CAPSULE | Freq: Two times a day (BID) | ORAL | 0 refills | Status: DC | PRN

## 2019-06-21 MED ORDER — LISINOPRIL 10 MG PO TABS: 10.0000 mg | ORAL_TABLET | Freq: Every day | ORAL | 1 refills | Status: DC

## 2019-06-21 MED ORDER — GLIPIZIDE 10 MG PO TABS: 10.0000 mg | ORAL_TABLET | Freq: Two times a day (BID) | ORAL | 1 refills | Status: DC

## 2019-06-21 MED ORDER — METFORMIN HCL 500 MG PO TABS: 1000.0000 mg | ORAL_TABLET | Freq: Two times a day (BID) | ORAL | 1 refills | Status: DC

## 2019-06-21 MED ORDER — ALBUTEROL SULFATE HFA 108 (90 BASE) MCG/ACT IN AERS: 2.0000 | INHALATION_SPRAY | Freq: Four times a day (QID) | RESPIRATORY_TRACT | 1 refills | Status: DC | PRN

## 2019-06-21 MED FILL — VENTOLIN HFA 90 MCG INHALER: 108 (90 BAS | 25 days supply | Qty: 18 | Fill #0

## 2019-06-21 MED FILL — glipiZIDE 10 MG TABS: 10 | 30 days supply | Qty: 60 | Fill #0

## 2019-06-21 MED FILL — LISINOPRIL 10 MG TABS: 10 | 30 days supply | Qty: 30 | Fill #0

## 2019-06-21 MED FILL — BENZONATATE 100 MG CAPS: 100 | 10 days supply | Qty: 20 | Fill #0

## 2019-06-21 MED FILL — metFORMIN HCL 500 MG TABS: 500 | 30 days supply | Qty: 120 | Fill #0

## 2019-06-21 NOTE — Progress Notes (Signed)
Patient has been called and DOB has been verified. Patient has been screened and transferred to PCP to start phone visit.   Cough x 1 week, negative covid test, wheezing and chest tightness.

## 2019-06-21 NOTE — Progress Notes (Signed)
Virtual Visit via Telephone Note  I connected with [name], on 06/21/2019 at 11:11 AM by telephone due to the COVID-19 pandemic and verified that I am speaking with the correct person using two identifiers.   Consent: I discussed the limitations, risks, security and privacy concerns of performing an evaluation and management service by telephone and the availability of in person appointments. I also discussed with the patient that there may be a patient responsible charge related to this service. The patient expressed understanding and agreed to proceed.   Location of Patient: Home  Location of Provider: Clinic   Persons participating in Telemedicine visit: Mamoudou A Eusebio Me Farrington-CMA Dr. Margarita Rana     History of Present Illness: Jacob [name] is a 44 year old male with a history of hypertension, hyperlipidemia, type 2 diabetes mellitus (A1c 8.7 from 06/2018) who is seen with the following complaints today. He feels he is "getting over the Flu". 5 days ago he had fever, decreased energy, fatigue, diarrhea. Tested for COVID-19 five days ago and was negative but wife tested positive. He has a dry cough, wheezing and is using OTC mucinex, Nyquil, dayquil. He does not have dyspnea but has some chest tightness at night . He is currently under quarantine.  Compliant with his Diabetic Medications and is due for an A1c. Taking his Statin and antihypertensive as prescribed. Past Medical History:  Diagnosis Date  . Allergy   . Diabetes mellitus without complication ([place])   . Hypertension    No Known Allergies  Current Outpatient Medications on File Prior to Visit  Medication Sig Dispense Refill  . Blood Glucose Monitoring Suppl (TRUE METRIX METER) DEVI 1 each by Does not apply route 3 (three) times daily before meals. 1 Device 0  . clotrimazole (LOTRIMIN) 1 % cream Apply 1 application topically 2 (two) times daily. 30 g 1  . glipiZIDE (GLUCOTROL) 10 MG tablet Take 1 tablet  (10 mg total) by mouth 2 (two) times daily before a meal. 180 tablet 1  . glucose blood (TRUE METRIX BLOOD GLUCOSE TEST) test strip Use three times daily 100 each 12  . lisinopril (ZESTRIL) 10 MG tablet Take 1 tablet (10 mg total) by mouth daily. 90 tablet 1  . metFORMIN (GLUCOPHAGE) 500 MG tablet Take 2 tablets (1,000 mg total) by mouth 2 (two) times daily with a meal. 360 tablet 1  . TRUEPLUS LANCETS 28G MISC 1 each by Does not apply route 3 (three) times daily before meals. 100 each 12  . atorvastatin (LIPITOR) 20 MG tablet Take 1 tablet (20 mg total) by mouth daily. (Patient not taking: Reported on 06/21/2019) 90 tablet 1   No current facility-administered medications on file prior to visit.    Observations/Objective: Alert, awake, oriented x3 Not in acute distress  Lab Results  Component Value Date   HGBA1C 8.7 (A) 06/28/2018    Assessment and Plan: 1. Contact with and (suspected) exposure to covid-19 He most likely has COVID-19 but was not within window of detection hence negative test He is planning to go get retested Will prescribe antitussive, bronchodilator  Advise to quarantine until 10 days from symptom onset Will order COVID-19 my chart companion - albuterol (VENTOLIN HFA) 108 (90 Base) MCG/ACT inhaler; Inhale 2 puffs into the lungs every 6 (six) hours as needed for wheezing or shortness of breath.  Dispense: 18 g; Refill: 1 - benzonatate (TESSALON) 100 MG capsule; Take 1 capsule (100 mg total) by mouth 2 (two) times daily as needed for  cough.  Dispense: 20 capsule; Refill: 0 - MyChart COVID-19 home monitoring program; Future - Temperature monitoring; Future  2. Diabetes mellitus without complication (HCC) Uncontrolled with A1c of 8.7 Due for A1c - he will schedule for an appointment once acute infection resoles meanwhile medications refilled - glipiZIDE (GLUCOTROL) 10 MG tablet; Take 1 tablet (10 mg total) by mouth 2 (two) times daily before a meal.  Dispense: 180  tablet; Refill: 1 - metFORMIN (GLUCOPHAGE) 500 MG tablet; Take 2 tablets (1,000 mg total) by mouth 2 (two) times daily with a meal.  Dispense: 360 tablet; Refill: 1  3. Essential hypertension BP at last in person visit was elevated  Counseled on blood pressure goal of less than 130/80, low-sodium, DASH diet, medication compliance, 150 minutes of moderate intensity exercise per week. Discussed medication compliance, adverse effects. Will reassess at his next in person visit - lisinopril (ZESTRIL) 10 MG tablet; Take 1 tablet (10 mg total) by mouth daily.  Dispense: 90 tablet; Refill: 1   Follow Up Instructions: 1 month for chronic disease management   I discussed the assessment and treatment plan with the patient. The patient was provided an opportunity to ask questions and all were answered. The patient agreed with the plan and demonstrated an understanding of the instructions.   The patient was advised to call back or seek an in-person evaluation if the symptoms worsen or if the condition fails to improve as anticipated.     I provided 15 minutes total of non-face-to-face time during this encounter including median intraservice time, reviewing previous notes, investigations, ordering medications, medical decision making, coordinating care and patient verbalized understanding at the end of the visit.     Hoy Register, MD, FAAFP. [facility] [place], [place] [phone]   06/21/2019, 11:11 AM

## 2019-06-22 ENCOUNTER — Encounter: Payer: Self-pay | Admitting: Family Medicine

## 2020-10-31 ENCOUNTER — Other Ambulatory Visit: Payer: Self-pay

## 2020-10-31 ENCOUNTER — Ambulatory Visit: Payer: BC Managed Care – PPO | Admitting: Physician Assistant

## 2020-10-31 VITALS — BP 193/125 | HR 78 | Temp 98.2°F | Resp 18 | Ht 73.0 in | Wt 228.0 lb

## 2020-10-31 DIAGNOSIS — E119 Type 2 diabetes mellitus without complications: Secondary | ICD-10-CM | POA: Diagnosis not present

## 2020-10-31 DIAGNOSIS — Z1159 Encounter for screening for other viral diseases: Secondary | ICD-10-CM

## 2020-10-31 DIAGNOSIS — Z114 Encounter for screening for human immunodeficiency virus [HIV]: Secondary | ICD-10-CM

## 2020-10-31 DIAGNOSIS — I160 Hypertensive urgency: Secondary | ICD-10-CM | POA: Diagnosis not present

## 2020-10-31 DIAGNOSIS — E782 Mixed hyperlipidemia: Secondary | ICD-10-CM | POA: Diagnosis not present

## 2020-10-31 DIAGNOSIS — I10 Essential (primary) hypertension: Secondary | ICD-10-CM

## 2020-10-31 DIAGNOSIS — E559 Vitamin D deficiency, unspecified: Secondary | ICD-10-CM

## 2020-10-31 LAB — POCT GLYCOSYLATED HEMOGLOBIN (HGB A1C): Hemoglobin A1C: 9.7 % — AB (ref 4.0–5.6)

## 2020-10-31 LAB — GLUCOSE, POCT (MANUAL RESULT ENTRY): POC Glucose: 219 mg/dl — AB (ref 70–99)

## 2020-10-31 MED ORDER — CLONIDINE HCL 0.2 MG PO TABS: 0.2000 mg | ORAL_TABLET | Freq: Once | ORAL | Status: DC

## 2020-10-31 MED ORDER — LISINOPRIL 10 MG PO TABS
10.0000 mg | ORAL_TABLET | Freq: Every day | ORAL | 1 refills | Status: DC
  Filled 2020-10-31: qty 30, 30d supply, fill #0
  Filled 2020-12-04: qty 30, 30d supply, fill #1

## 2020-10-31 MED ORDER — TRUEPLUS LANCETS 28G MISC
1.0000 | Freq: Three times a day (TID) | 12 refills | Status: DC
  Filled 2020-10-31: qty 100, 30d supply, fill #0
  Filled 2020-12-04: qty 100, 30d supply, fill #1

## 2020-10-31 MED ORDER — METFORMIN HCL 500 MG PO TABS
500.0000 mg | ORAL_TABLET | Freq: Two times a day (BID) | ORAL | 1 refills | Status: DC
  Filled 2020-10-31: qty 60, 30d supply, fill #0
  Filled 2020-12-04: qty 60, 30d supply, fill #1

## 2020-10-31 MED ORDER — TRUE METRIX BLOOD GLUCOSE TEST VI STRP
ORAL_STRIP | 12 refills | Status: DC
  Filled 2020-10-31: qty 100, 30d supply, fill #0
  Filled 2020-12-04: qty 100, 30d supply, fill #1

## 2020-10-31 MED ORDER — ATORVASTATIN CALCIUM 20 MG PO TABS
20.0000 mg | ORAL_TABLET | Freq: Every day | ORAL | 1 refills | Status: DC
Start: 2020-10-31 — End: 2021-01-07
  Filled 2020-10-31: qty 30, 30d supply, fill #0
  Filled 2020-12-04: qty 30, 30d supply, fill #1

## 2020-10-31 NOTE — Progress Notes (Signed)
Established Patient Office Visit  Subjective:  Patient ID: Jacob [name], male    DOB: 1976-03-09  Age: 45 y.o. MRN: [id number]  CC:  Chief Complaint  Patient presents with   Hypertension   Diabetes     HPI [name] requests refills on his medications.  States that he had difficulty being seen for primary care due to the pandemic.  Reports that he has been out of his blood pressure medication for "several months.".  States that he does not check his blood pressure at home.  Denies any hypertensive symptoms.  Reports that he reduced his metformin on his own to one 500 mg tablet once a day.  Reports that when he was taking 1000 mg twice a day along with the glipizide that he had episodes of weakness and dizziness.  Reports when he takes it once a day he feels his blood glucose levels are much better controlled.  Reports that he is working on improving his lifestyle as well.  Has not been checking his blood glucose levels at home.     Past Medical History:  Diagnosis Date   Allergy    Diabetes mellitus without complication ([place])    Hypertension     Past Surgical History:  Procedure Laterality Date   APPENDECTOMY     LAPAROSCOPIC APPENDECTOMY  10/24/2013   Procedure: APPENDECTOMY LAPAROSCOPIC;  Surgeon: Shann Medal, MD;  Location: [facility];  Service: General;;    Family History  Problem Relation Age of Onset   Asthma Son     Social History   Socioeconomic History   Marital status: Single    Spouse name: Not on file   Number of children: Not on file   Years of education: Not on file   Highest education level: Not on file  Occupational History   Not on file  Tobacco Use   Smoking status: Never   Smokeless tobacco: Never  Substance and Sexual Activity   Alcohol use: Yes   Drug use: No   Sexual activity: Not on file  Other Topics Concern   Not on file  Social History Narrative   Not on file   Social Determinants of Health   Financial Resource Strain:  Not on file  Food Insecurity: Not on file  Transportation Needs: Not on file  Physical Activity: Not on file  Stress: Not on file  Social Connections: Not on file  Intimate Partner Violence: Not on file    Outpatient Medications Prior to Visit  Medication Sig Dispense Refill   albuterol (VENTOLIN HFA) 108 (90 Base) MCG/ACT inhaler Inhale 2 puffs into the lungs every 6 (six) hours as needed for wheezing or shortness of breath. 18 g 1   Blood Glucose Monitoring Suppl (TRUE METRIX METER) DEVI 1 each by Does not apply route 3 (three) times daily before meals. 1 Device 0   clotrimazole (LOTRIMIN) 1 % cream Apply 1 application topically 2 (two) times daily. 30 g 1   glipiZIDE (GLUCOTROL) 10 MG tablet Take 1 tablet (10 mg total) by mouth 2 (two) times daily before a meal. 180 tablet 1   glucose blood (TRUE METRIX BLOOD GLUCOSE TEST) test strip Use three times daily 100 each 12   lisinopril (ZESTRIL) 10 MG tablet Take 1 tablet (10 mg total) by mouth daily. 90 tablet 1   metFORMIN (GLUCOPHAGE) 500 MG tablet Take 2 tablets (1,000 mg total) by mouth 2 (two) times daily with a meal. 360 tablet 1  TRUEPLUS LANCETS 28G MISC 1 each by Does not apply route 3 (three) times daily before meals. 100 each 12   atorvastatin (LIPITOR) 20 MG tablet Take 1 tablet (20 mg total) by mouth daily. (Patient not taking: No sig reported) 90 tablet 1   benzonatate (TESSALON) 100 MG capsule Take 1 capsule (100 mg total) by mouth 2 (two) times daily as needed for cough. 20 capsule 0   No facility-administered medications prior to visit.    No Known Allergies  ROS Review of Systems  Constitutional:  Negative for chills and fever.  HENT: Negative.    Eyes: Negative.   Respiratory:  Negative for chest tightness and shortness of breath.   Cardiovascular:  Negative for chest pain and palpitations.  Gastrointestinal: Negative.   Endocrine: Negative.   Genitourinary: Negative.   Musculoskeletal: Negative.   Skin:  Negative.   Allergic/Immunologic: Negative.   Neurological:  Negative for dizziness, syncope, speech difficulty, weakness and headaches.  Hematological: Negative.   Psychiatric/Behavioral: Negative.       Objective:    Physical Exam Vitals and nursing note reviewed.  Constitutional:      Appearance: Normal appearance.  HENT:     Head: Normocephalic and atraumatic.     Right Ear: External ear normal.     Left Ear: External ear normal.     Nose: Nose normal.     Mouth/Throat:     Mouth: Mucous membranes are moist.     Pharynx: Oropharynx is clear.  Eyes:     Extraocular Movements: Extraocular movements intact.     Conjunctiva/sclera: Conjunctivae normal.     Pupils: Pupils are equal, round, and reactive to light.  Cardiovascular:     Rate and Rhythm: Normal rate and regular rhythm.     Pulses: Normal pulses.     Heart sounds: Normal heart sounds.  Pulmonary:     Effort: Pulmonary effort is normal.     Breath sounds: Normal breath sounds.  Musculoskeletal:        General: Normal range of motion.     Cervical back: Normal range of motion and neck supple.  Skin:    General: Skin is warm and dry.  Neurological:     General: No focal deficit present.     Mental Status: He is alert and oriented to person, place, and time.  Psychiatric:        Mood and Affect: Mood normal.        Behavior: Behavior normal.        Thought Content: Thought content normal.        Judgment: Judgment normal.    BP (!) 193/125 (BP Location: Left Arm, Patient Position: Sitting, Cuff Size: Large)   Pulse 78   Temp 98.2 F (36.8 C) (Oral)   Resp 18   Ht 6' 1"  (1.854 m)   Wt 228 lb (103.4 kg)   SpO2 97%   BMI 30.08 kg/m  Wt Readings from Last 3 Encounters:  10/31/20 228 lb (103.4 kg)  06/28/18 237 lb 12.8 oz (107.9 kg)  03/28/18 234 lb 6.4 oz (106.3 kg)     Health Maintenance Due  Topic Date Due   COVID-19 Vaccine (1) Never done   TETANUS/TDAP  Never done   COLONOSCOPY (Pts 45-65yr  Insurance coverage will need to be confirmed)  Never done   FOOT EXAM  03/29/2019   OPHTHALMOLOGY EXAM  06/14/2019    There are no preventive care reminders to display for this patient.  Lab  Results  Component Value Date   TSH 1.760 10/31/2020   Lab Results  Component Value Date   WBC 6.6 10/31/2020   HGB 15.8 10/31/2020   HCT 46.5 10/31/2020   MCV 85 10/31/2020   PLT 188 10/31/2020   Lab Results  Component Value Date   NA 141 10/31/2020   K CANCELED 10/31/2020   CO2 25 06/28/2018   GLUCOSE CANCELED 10/31/2020   BUN 11 10/31/2020   CREATININE 1.02 10/31/2020   BILITOT 0.8 10/31/2020   ALKPHOS 135 (H) 10/31/2020   AST 24 10/31/2020   ALT 15 06/28/2018   PROT 7.3 10/31/2020   ALBUMIN 4.3 10/31/2020   CALCIUM 9.6 10/31/2020   EGFR 91 10/31/2020   Lab Results  Component Value Date   CHOL 229 (H) 10/31/2020   Lab Results  Component Value Date   HDL 42 10/31/2020   Lab Results  Component Value Date   LDLCALC 152 (H) 10/31/2020   Lab Results  Component Value Date   TRIG 191 (H) 10/31/2020   Lab Results  Component Value Date   CHOLHDL 5.5 (H) 10/31/2020   Lab Results  Component Value Date   HGBA1C 9.7 (A) 10/31/2020      Assessment & Plan:   Problem List Items Addressed This Visit       Cardiovascular and Mediastinum   Essential hypertension   Relevant Medications   lisinopril (ZESTRIL) 10 MG tablet   atorvastatin (LIPITOR) 20 MG tablet   cloNIDine (CATAPRES) tablet 0.2 mg   Hypertensive urgency   Relevant Medications   lisinopril (ZESTRIL) 10 MG tablet   atorvastatin (LIPITOR) 20 MG tablet   cloNIDine (CATAPRES) tablet 0.2 mg     Endocrine   Diabetes mellitus without complication (HCC) - Primary   Relevant Medications   metFORMIN (GLUCOPHAGE) 500 MG tablet   lisinopril (ZESTRIL) 10 MG tablet   TRUEplus Lancets 28G MISC   glucose blood (TRUE METRIX BLOOD GLUCOSE TEST) test strip   atorvastatin (LIPITOR) 20 MG tablet   Other Relevant  Orders   HgB A1c (Completed)   CBC with Differential/Platelet (Completed)   Comp. Metabolic Panel (12) (Completed)   TSH (Completed)   Microalbumin / creatinine urine ratio (Completed)   Glucose (CBG) (Completed)   Vitamin D, 25-hydroxy (Completed)     Other   Hyperlipidemia   Relevant Medications   lisinopril (ZESTRIL) 10 MG tablet   atorvastatin (LIPITOR) 20 MG tablet   cloNIDine (CATAPRES) tablet 0.2 mg   Other Relevant Orders   Lipid panel (Completed)   Other Visit Diagnoses     Screening for HIV (human immunodeficiency virus)       Relevant Orders   HIV antibody (with reflex) (Completed)   Encounter for HCV screening test for low risk patient       Relevant Orders   HCV Ab w Reflex to Quant PCR (Completed)       Meds ordered this encounter  Medications   metFORMIN (GLUCOPHAGE) 500 MG tablet    Sig: Take 1 tablet (500 mg total) by mouth 2 (two) times daily with a meal.    Dispense:  60 tablet    Refill:  1    Dose change    Order Specific Question:   Supervising Provider    Answer:   Asencion Noble E [[id number]]   lisinopril (ZESTRIL) 10 MG tablet    Sig: Take 1 tablet (10 mg total) by mouth daily.    Dispense:  30 tablet    Refill:  1    Order Specific Question:   Supervising Provider    Answer:   Asencion Noble E [[id number]]   TRUEplus Lancets 28G MISC    Sig: 1 each by Does not apply route 3 (three) times daily before meals.    Dispense:  100 each    Refill:  12    Order Specific Question:   Supervising Provider    Answer:   Asencion Noble E [[id number]]   glucose blood (TRUE METRIX BLOOD GLUCOSE TEST) test strip    Sig: Use three times daily    Dispense:  100 each    Refill:  12    Order Specific Question:   Supervising Provider    Answer:   Asencion Noble E [[id number]]   atorvastatin (LIPITOR) 20 MG tablet    Sig: Take 1 tablet (20 mg total) by mouth daily.    Dispense:  30 tablet    Refill:  1    Order Specific Question:   Supervising Provider    Answer:    Asencion Noble E [[id number]]   cloNIDine (CATAPRES) tablet 0.2 mg  1. Diabetes mellitus without complication (HCC) W2O 9.7.  Patient encouraged to take metformin 500 mg twice a day.  Patient encouraged to check blood glucose levels twice a day, keep a written log and have available for all office visits.  Patient was given a 1 month [facility] and appointment in 2 months with Dr. Margarita Rana.  Continue diabetic diet.  Fasting labs completed today - HgB A1c - CBC with Differential/Platelet - Comp. Metabolic Panel (12) - TSH - Microalbumin / creatinine urine ratio - Glucose (CBG) - metFORMIN (GLUCOPHAGE) 500 MG tablet; Take 1 tablet (500 mg total) by mouth 2 (two) times daily with a meal.  Dispense: 60 tablet; Refill: 1 - TRUEplus Lancets 28G MISC; 1 each by Does not apply route 3 (three) times daily before meals.  Dispense: 100 each; Refill: 12 - glucose blood (TRUE METRIX BLOOD GLUCOSE TEST) test strip; Use three times daily  Dispense: 100 each; Refill: 12 - Vitamin D, 25-hydroxy  2. Essential hypertension Patient given 0.2 mg of clonidine in clinic, patient denied any hypertensive symptoms.  Patient encouraged to resume lisinopril, check blood pressure at home on a daily basis, keep a written log and have available for all office visits.  Red flags given for prompt reevaluation. - lisinopril (ZESTRIL) 10 MG tablet; Take 1 tablet (10 mg total) by mouth daily.  Dispense: 30 tablet; Refill: 1 - cloNIDine (CATAPRES) tablet 0.2 mg  3. Mixed hyperlipidemia Resume atorvastatin - Lipid panel - atorvastatin (LIPITOR) 20 MG tablet; Take 1 tablet (20 mg total) by mouth daily.  Dispense: 30 tablet; Refill: 1  4. Hypertensive urgency   - cloNIDine (CATAPRES) tablet 0.2 mg  5. Screening for HIV (human immunodeficiency virus)  - HIV antibody (with reflex)  6. Encounter for HCV screening test for low risk patient  - HCV Ab w Reflex  to Quant PCR   I have reviewed the patient's medical history (PMH, PSH, Social History, Family History, Medications, and allergies) , and have been updated if relevant. I spent 32 minutes reviewing chart and  face to face time with patient.    Follow-up: Return in about 29 days (around 11/29/2020).    Loraine Grip Mayers, PA-C

## 2020-10-31 NOTE — Patient Instructions (Addendum)
For your diabetes, you are going to take metformin 500 mg twice a day.  Continue working on a low carbohydrate diet.  As you to check your blood glucose levels twice a day, once in the morning before you have anything to eat or drink, and again 2 hours after dinner.  Please keep a written log of these readings, and have available for all office visits.  For your blood pressure, you are going to resume lisinopril 10 mg once a day.  I encourage you to check your blood pressure at home, keep a written log and have available for all office visits.  For your cholesterol, you are going to resume atorvastatin once a day.  We will call you with your lab results.  Roney Jaffe, PA-C Physician Assistant [facility] [email or web address]  Warning Signs of a Stroke A stroke is a medical emergency and should be treated right away--every second counts. A stroke is caused by a decrease or block in blood flow to the brain. When certain areas of the brain do not get enough oxygen, brain cells begin to die. A stroke can lead to brain damage and can sometimes be life-threatening. However, if a person gets medical treatment right away, he or she has a better chance of surviving and recovering from a stroke. It is very important to be able to recognize the symptoms of a stroke. Types of strokes There are two main types of strokes: Ischemic stroke. This is the most common type. This stroke happens when a blood vessel that supplies blood to the brain is blocked. Hemorrhagic stroke. This results from bleeding in the brain when a blood vessel leaks or bursts (ruptures). A transient ischemic attack (TIA) causes stroke-like symptoms that go away quickly. Unlike a stroke, a TIA does not cause permanent damage to the brain. However, the symptoms of a TIA are the same as a stroke. TIAs also require medical treatment right away. Having a TIA is a sign that you are at  higher risk for a stroke. Warning signs of a stroke The symptoms of stroke may vary and will reflect the part of the brain that is involved. Symptoms usually happen suddenly. "BE FAST" is an easy way to remember the main warning signs of a stroke: B - Balance. Signs are dizziness, sudden trouble walking, or loss of balance. E - Eyes. Signs are trouble seeing or a sudden change in vision. F - Face. Signs are sudden weakness or numbness of the face, or the face or eyelid drooping on one side. A - Arms. Signs are weakness or numbness in an arm. This happens suddenly and usually on one side of the body. S - Speech. Signs are sudden trouble speaking, slurred speech, or trouble understanding what people say. T - Time. Time to call emergency services. Write down what time symptoms started. Other signs of a stroke Some less common signs of a stroke include: A sudden, severe headache with no known cause. Nausea or vomiting. Seizure. A stroke may be happening even if only one "BE FAST" symptom is present. These symptoms may represent a serious problem that is an emergency. Do not wait to see if the symptoms will go away. Get medical help right away. Call your local emergency services (911 in the [place] Do not drive yourself to the hospital.   Summary A stroke is a medical emergency and should be treated right away--every second counts. "BE FAST" is an easy way to remember the main warning  signs of a stroke. Call your local emergency services right away if you or someone else has any stroke symptoms, even if the symptoms go away. Make note of what time the first symptoms appeared. Emergency responders or emergency room staff will need to know this information. Do not wait to see if symptoms will go away. Call 911 even if only one of the "BE FAST" symptoms appears. This information is not intended to replace advice given to you by your health care provider. Make sure you discuss any questions you have with  your health care provider. Document Revised: 12/11/2019 Document Reviewed: 12/11/2019 Elsevier Patient Education  2021 [employer]. How to Take Your Blood Pressure Blood pressure is a measurement of how strongly your blood is pressing against the walls of your arteries. Arteries are blood vessels that carry blood from your heart throughout your body. Your health care provider takes your blood pressure at each office visit. You can also take your own blood pressure at home with a blood pressure monitor. You may need to take your own blood pressure to: Confirm a diagnosis of high blood pressure (hypertension). Monitor your blood pressure over time. Make sure your blood pressure medicine is working. Supplies needed: Blood pressure monitor. Dining room chair to sit in. Table or desk. Small notebook and pencil or pen. How to prepare To get the most accurate reading, avoid the following for 30 minutes before you check your blood pressure: Drinking caffeine. Drinking alcohol. Eating. Smoking. Exercising. Five minutes before you check your blood pressure: Use the bathroom and urinate so that you have an empty bladder. Sit quietly in a dining room chair. Do not sit in a soft couch or an armchair. Do not talk. How to take your blood pressure To check your blood pressure, follow the instructions in the manual that came with your blood pressure monitor. If you have a digital blood pressure monitor, the instructions may be as follows: Sit up straight in a chair. Place your feet on the floor. Do not cross your ankles or legs. Rest your left arm at the level of your heart on a table or desk or on the arm of a chair. Pull up your shirt sleeve. Wrap the blood pressure cuff around the upper part of your left arm, 1 inch (2.5 cm) above your elbow. It is best to wrap the cuff around bare skin. Fit the cuff snugly around your arm. You should be able to place only one finger between the cuff and your  arm. Position the cord so that it rests in the bend of your elbow. Press the power button. Sit quietly while the cuff inflates and deflates. Read the digital reading on the monitor screen and write the numbers down (record them) in a notebook. Wait 2-3 minutes, then repeat the steps, starting at step 1.   What does my blood pressure reading mean? A blood pressure reading consists of a higher number over a lower number. Ideally, your blood pressure should be below 120/80. The first ("top") number is called the systolic pressure. It is a measure of the pressure in your arteries as your heart beats. The second ("bottom") number is called the diastolic pressure. It is a measure of the pressure in your arteries as the heart relaxes. Blood pressure is classified into five stages. The following are the stages for adults who do not have a short-term serious illness or a chronic condition. Systolic pressure and diastolic pressure are measured in a unit called  mm Hg (millimeters of mercury).  Normal Systolic pressure: below 120. Diastolic pressure: below 80. Elevated Systolic pressure: 120-129. Diastolic pressure: below 80. Hypertension stage 1 Systolic pressure: 130-139. Diastolic pressure: 80-89. Hypertension stage 2 Systolic pressure: 140 or above. Diastolic pressure: 90 or above. You can have elevated blood pressure or hypertension even if only the systolic or only the diastolic number in your reading is higher than normal. Follow these instructions at home: Check your blood pressure as often as recommended by your health care provider. Check your blood pressure at the same time every day. Take your monitor to the next appointment with your health care provider to make sure that: You are using it correctly. It provides accurate readings. Be sure you understand what your goal blood pressure numbers are. Tell your health care provider if you are having any side effects from blood pressure  medicine. Keep all follow-up visits as told by your health care provider. This is important. General tips Your health care provider can suggest a reliable monitor that will meet your needs. There are several types of home blood pressure monitors. Choose a monitor that has an arm cuff. Do not choose a monitor that measures your blood pressure from your wrist or finger. Choose a cuff that wraps snugly around your upper arm. You should be able to fit only one finger between your arm and the cuff. You can buy a blood pressure monitor at most drugstores or online. Where to find more information American Heart Association: www.heart.org Contact a health care provider if: Your blood pressure is consistently high. Get help right away if: Your systolic blood pressure is higher than 180. Your diastolic blood pressure is higher than 120. Summary Blood pressure is a measurement of how strongly your blood is pressing against the walls of your arteries. A blood pressure reading consists of a higher number over a lower number. Ideally, your blood pressure should be below 120/80. Check your blood pressure at the same time every day. Avoid caffeine, alcohol, smoking, and exercise for 30 minutes prior to checking your blood pressure. These agents can affect the accuracy of the blood pressure reading. This information is not intended to replace advice given to you by your health care provider. Make sure you discuss any questions you have with your health care provider. Document Revised: 05/05/2019 Document Reviewed: 05/05/2019 Elsevier Patient Education  2021 [employer].

## 2020-10-31 NOTE — Progress Notes (Signed)
Patient has not eaten today. Patient took metformin this morning and has been out of all other medication for 2 months. Patient denies pain at this time Patient reports lisinopril making him "feel funny" so he stopped it more than 2 months ago.

## 2020-11-01 LAB — LIPID PANEL
Chol/HDL Ratio: 5.5 ratio — ABNORMAL HIGH (ref 0.0–5.0)
Cholesterol, Total: 229 mg/dL — ABNORMAL HIGH (ref 100–199)
HDL: 42 mg/dL (ref >39)
LDL Chol Calc (NIH): 152 mg/dL — ABNORMAL HIGH (ref 0–99)
Triglycerides: 191 mg/dL — ABNORMAL HIGH (ref 0–149)
VLDL Cholesterol Cal: 35 mg/dL (ref 5–40)

## 2020-11-01 LAB — COMP. METABOLIC PANEL (12)
AST: 24 IU/L (ref 0–40)
Albumin/Globulin Ratio: 1.4 (ref 1.2–2.2)
Albumin: 4.3 g/dL (ref 4.0–5.0)
Alkaline Phosphatase: 135 IU/L — ABNORMAL HIGH (ref 44–121)
BUN/Creatinine Ratio: 11 (ref 9–20)
BUN: 11 mg/dL (ref 6–24)
Bilirubin Total: 0.8 mg/dL (ref 0.0–1.2)
Calcium: 9.6 mg/dL (ref 8.7–10.2)
Chloride: 96 mmol/L (ref 96–106)
Creatinine, Ser: 1.02 mg/dL (ref 0.76–1.27)
Globulin, Total: 3 g/dL (ref 1.5–4.5)
Sodium: 141 mmol/L (ref 134–144)
Total Protein: 7.3 g/dL (ref 6.0–8.5)
eGFR: 91 mL/min/{1.73_m2} (ref >59)

## 2020-11-01 LAB — CBC WITH DIFFERENTIAL/PLATELET
Basophils Absolute: 0 10*3/uL (ref 0.0–0.2)
Basos: 0 %
EOS (ABSOLUTE): 0.1 10*3/uL (ref 0.0–0.4)
Eos: 1 %
Hematocrit: 46.5 % (ref 37.5–51.0)
Hemoglobin: 15.8 g/dL (ref 13.0–17.7)
Immature Grans (Abs): 0 10*3/uL (ref 0.0–0.1)
Immature Granulocytes: 0 %
Lymphocytes Absolute: 2.3 10*3/uL (ref 0.7–3.1)
Lymphs: 34 %
MCH: 28.8 pg (ref 26.6–33.0)
MCHC: 34 g/dL (ref 31.5–35.7)
MCV: 85 fL (ref 79–97)
Monocytes Absolute: 0.7 10*3/uL (ref 0.1–0.9)
Monocytes: 10 %
Neutrophils Absolute: 3.5 10*3/uL (ref 1.4–7.0)
Neutrophils: 55 %
Platelets: 188 10*3/uL (ref 150–450)
RBC: 5.49 x10E6/uL (ref 4.14–5.80)
RDW: 13.5 % (ref 11.6–15.4)
WBC: 6.6 10*3/uL (ref 3.4–10.8)

## 2020-11-01 LAB — HCV INTERPRETATION

## 2020-11-01 LAB — MICROALBUMIN / CREATININE URINE RATIO
Creatinine, Urine: 148.1 mg/dL
Microalb/Creat Ratio: 43 mg/g creat — ABNORMAL HIGH (ref 0–29)
Microalbumin, Urine: 64.1 ug/mL

## 2020-11-01 LAB — HCV AB W REFLEX TO QUANT PCR: HCV Ab: <0.1 s/co ratio (ref 0.0–0.9)

## 2020-11-01 LAB — HIV ANTIBODY (ROUTINE TESTING W REFLEX): HIV Screen 4th Generation wRfx: NONREACTIVE

## 2020-11-01 LAB — VITAMIN D 25 HYDROXY (VIT D DEFICIENCY, FRACTURES): Vit D, 25-Hydroxy: 10.7 ng/mL — ABNORMAL LOW (ref 30.0–100.0)

## 2020-11-01 LAB — TSH: TSH: 1.76 u[IU]/mL (ref 0.450–4.500)

## 2020-11-04 ENCOUNTER — Other Ambulatory Visit: Payer: Self-pay

## 2020-11-04 DIAGNOSIS — E559 Vitamin D deficiency, unspecified: Secondary | ICD-10-CM | POA: Insufficient documentation

## 2020-11-04 MED ORDER — VITAMIN D (ERGOCALCIFEROL) 1.25 MG (50000 UNIT) PO CAPS
50000.0000 [IU] | ORAL_CAPSULE | ORAL | 2 refills | Status: DC
Start: 2020-11-04 — End: 2021-10-08
  Filled 2020-11-04 – 2020-11-13 (×2): qty 4, 28d supply, fill #0

## 2020-11-04 NOTE — Addendum Note (Signed)
Addended by: Roney Jaffe on: 11/04/2020 09:04 AM   Modules accepted: Orders

## 2020-11-06 ENCOUNTER — Telehealth: Payer: Self-pay | Admitting: *Deleted

## 2020-11-06 NOTE — Telephone Encounter (Signed)
-----   Message from Cari S Mayers, PA-C sent at 11/04/2020  9:04 AM EDT ----- Please call patient and let him know that his thyroid function is within normal limits.  His kidney function is slightly elevated, and one of his liver enzymes is slightly elevated.  This should return to within normal limits as his blood glucose levels improved.  His screening for hepatitis C and HIV were negative.  His cholesterol is elevated, his risk of a cardiovascular event in the next 10 years is 30%.  It is important that he resumes his cholesterol medication and takes it on a daily basis as well as following a low-cholesterol diet.  His vitamin D is low, he needs to take 50,000 units once a week for the next 12 weeks and have it rechecked at that time.  Prescription for vitamin D sent to his pharmacy.   The 10-year ASCVD risk score (Goff DC Jr., et al., 2013) is: 30.4%   Values used to calculate the score:     Age: 45 years     Sex: Male     Is Non-Hispanic African American: Yes     Diabetic: Yes     Tobacco smoker: No     Systolic Blood Pressure: 193 mmHg     Is BP treated: Yes     HDL Cholesterol: 42 mg/dL     Total Cholesterol: 229 mg/dL  

## 2020-11-06 NOTE — Telephone Encounter (Signed)
UTR patient via numbers listed.

## 2020-11-11 ENCOUNTER — Other Ambulatory Visit: Payer: Self-pay

## 2020-11-11 ENCOUNTER — Telehealth: Payer: Self-pay | Admitting: *Deleted

## 2020-11-11 NOTE — Telephone Encounter (Signed)
-----   Message from Roney Jaffe, [place] sent at 11/04/2020  9:04 AM EDT ----- Please call patient and let him know that his thyroid function is within normal limits.  His kidney function is slightly elevated, and one of his liver enzymes is slightly elevated.  This should return to within normal limits as his blood glucose levels improved.  His screening for hepatitis C and HIV were negative.  His cholesterol is elevated, his risk of a cardiovascular event in the next 10 years is 30%.  It is important that he resumes his cholesterol medication and takes it on a daily basis as well as following a low-cholesterol diet.  His vitamin D is low, he needs to take 50,000 units once a week for the next 12 weeks and have it rechecked at that time.  Prescription for vitamin D sent to his pharmacy.   The 10-year ASCVD risk score Denman George DC Montez Hageman., et al., 2013) is: 30.4%   Values used to calculate the score:     Age: 45 years     Sex: Male     Is Non-Hispanic African American: Yes     Diabetic: Yes     Tobacco smoker: No     Systolic Blood Pressure: 193 mmHg     Is BP treated: Yes     HDL Cholesterol: 42 mg/dL     Total Cholesterol: 229 mg/dL

## 2020-11-11 NOTE — Telephone Encounter (Signed)
Patient verified DOB Patient is aware of thyroid function being normal with elevated kidney and liver levels. Patient advised to control CBG levels to aid in lowering kidney and liver levels. Patient is also aware of picking up vitamin d to take once a week and cholesterol medication being restarted. No further questions at this time.

## 2020-11-13 ENCOUNTER — Other Ambulatory Visit: Payer: Self-pay

## 2020-11-29 ENCOUNTER — Ambulatory Visit: Payer: BC Managed Care – PPO | Admitting: Pharmacist

## 2020-12-04 ENCOUNTER — Encounter: Payer: Self-pay | Admitting: Pharmacist

## 2020-12-04 ENCOUNTER — Other Ambulatory Visit: Payer: Self-pay

## 2020-12-04 ENCOUNTER — Ambulatory Visit: Payer: BC Managed Care – PPO | Attending: Family Medicine | Admitting: Pharmacist

## 2020-12-04 DIAGNOSIS — E119 Type 2 diabetes mellitus without complications: Secondary | ICD-10-CM | POA: Diagnosis not present

## 2020-12-04 MED ORDER — TRUE METRIX METER W/DEVICE KIT
1.0000 | PACK | Freq: Three times a day (TID) | 0 refills | Status: DC
  Filled 2020-12-04: qty 1, 365d supply, fill #0

## 2020-12-04 NOTE — Progress Notes (Signed)
S:    PCP: Dr. Alvis Lemmings  No chief complaint on file.  Patient arrives in good spirits.  Presents for diabetes evaluation, education, and management Patient was referred and last seen by Maurene Capes on 10/31/2020.   Patient reports Diabetes was diagnosed >10 years ago. He has a hx of medication noncompliance. When he saw Cari, he required clonidine 0.2 mg in office d/t BP of 193/125 mmHg. When questioned, he reported stopping lisinopril because in the past it made him "feel funny". In addition, he stopped self-dosed insulin to 500 mg once daily d/t feeling weak and dizzy with the 1000 mg BID dose that he had taken previously.    Today, he reports medication adherence with all medications. Denies side effects. He is trying to do a better job of taking medications. However, he is not taking his blood sugar.   Family/Social History:  -Fhx: no pertinent positives  -Tobacco: never smoker -Alcohol: none currently   Insurance coverage/medication affordability: BCBS  Medication adherence reported.   Current diabetes medications include: glipizide 10 mg BID, metformin 500 mg BID Current hypertension medications include: lisinopril 10 mg daily  Current hyperlipidemia medications include: atorvastatin 20 mg daily   Patient reports hypoglycemic events. Describes an "icky" feeling that he gets if he goes a long time without eating. He says he feels shaky and treats by eating a snack. The episode resolves after eating. He does not check his blood sugar when experiencing these symptoms.   Patient reported dietary habits: -Reports that he has limited his portion sizes  -He admits to sweets and indulging in carbs occasionally but has decreased this since seeing Cari  Patient-reported exercise habits:  -None outside of work -He wants to get back in a gym or start walking    Patient denies polyuria. Patient denies neuropathy (nerve pain). Patient denies visual changes. Patient reports self foot  exams.     O:  Lab Results  Component Value Date   HGBA1C 9.7 (A) 10/31/2020   Vitals:   12/04/20 1400  BP: (!) 158/110    Lipid Panel     Component Value Date/Time   CHOL 229 (H) 10/31/2020 1157   TRIG 191 (H) 10/31/2020 1157   HDL 42 10/31/2020 1157   CHOLHDL 5.5 (H) 10/31/2020 1157   LDLCALC 152 (H) 10/31/2020 1157    Home fasting blood sugars: not checking  2 hour post-meal/random blood sugars: not checking.  Clinical Atherosclerotic Cardiovascular Disease (ASCVD): No  The 10-year ASCVD risk score Denman George DC Jr., et al., 2013) is: 21.9%   Values used to calculate the score:     Age: 45 years     Sex: Male     Is Non-Hispanic African American: Yes     Diabetic: Yes     Tobacco smoker: No     Systolic Blood Pressure: 158 mmHg     Is BP treated: Yes     HDL Cholesterol: 42 mg/dL     Total Cholesterol: 229 mg/dL   A/P: Diabetes longstanding currently uncontrolled based on A1c. Patient is able to verbalize appropriate hypoglycemia management plan. Medication adherence appears to be appropriate at this time. His control cannot be properly assessed as he is not checking home CBGs. Additionally, he endorses occasional symptoms of hypoglycemia but we do not have any readings. Hence, I cannot recommend any further changes at this time. I did emphasize lifestyle changes and the need for home glucose monitoring. He will pick up his glucose testing supplies after  his visit with me today.  -Continued current regimen.  -Reordered [employer] supplies for him. Advised him to pick these up and bring readings to Dr. Alvis Lemmings next month.  -Extensively discussed pathophysiology of diabetes, recommended lifestyle interventions, dietary effects on blood sugar control -Counseled on s/sx of and management of hypoglycemia -Next A1C anticipated 10/31/2020.   ASCVD risk - primary prevention in patient with diabetes. Last LDL is not controlled secondary to medication noncompliance. Since his visit  with Cari, he is now taking his atorvastatin daily. ASCVD risk score is >20%. I recommend high intensity statin, however, he declines any changes today.   -Continued atorvastatin 20 mg.   Hypertension longstanding currently above goal.  Blood pressure goal = 130/80 mmHg. Medication adherence is improving. His BP today is better with improved adherence to lisinopril. I recommended additional medication as he is still pretty elevated today. He declines this. He understands that if BP is elevated to this degree, Dr. Alvis Lemmings will likely add or titrate dose of current medication. He understands this but wishes to optimize lifestyle today.  -Continue lisinopril 10 mg daily.   Written patient instructions provided.  Total time in face to face counseling 30 minutes.   Follow up PCP Clinic Visit in 1 month.   Butch Penny, PharmD, [place], CPP Clinical Pharmacist [facility] [phone]

## 2020-12-05 ENCOUNTER — Ambulatory Visit: Payer: BC Managed Care – PPO | Admitting: Physician Assistant

## 2021-01-06 ENCOUNTER — Ambulatory Visit: Payer: BC Managed Care – PPO | Attending: Family Medicine | Admitting: Family Medicine

## 2021-01-06 ENCOUNTER — Encounter: Payer: Self-pay | Admitting: Family Medicine

## 2021-01-06 ENCOUNTER — Other Ambulatory Visit: Payer: Self-pay

## 2021-01-06 VITALS — BP 178/118 | HR 78 | Ht 73.0 in | Wt 231.0 lb

## 2021-01-06 DIAGNOSIS — E1142 Type 2 diabetes mellitus with diabetic polyneuropathy: Secondary | ICD-10-CM

## 2021-01-06 DIAGNOSIS — I152 Hypertension secondary to endocrine disorders: Secondary | ICD-10-CM | POA: Diagnosis not present

## 2021-01-06 DIAGNOSIS — E1159 Type 2 diabetes mellitus with other circulatory complications: Secondary | ICD-10-CM

## 2021-01-06 DIAGNOSIS — E119 Type 2 diabetes mellitus without complications: Secondary | ICD-10-CM

## 2021-01-06 LAB — GLUCOSE, POCT (MANUAL RESULT ENTRY): POC Glucose: 157 mg/dl — AB (ref 70–99)

## 2021-01-06 MED ORDER — METFORMIN HCL 500 MG PO TABS
1000.0000 mg | ORAL_TABLET | Freq: Two times a day (BID) | ORAL | 6 refills | Status: DC
  Filled 2021-01-06 – 2021-09-10 (×2): qty 120, 30d supply, fill #0

## 2021-01-06 MED ORDER — LISINOPRIL-HYDROCHLOROTHIAZIDE 20-25 MG PO TABS
1.0000 | ORAL_TABLET | Freq: Every day | ORAL | 6 refills | Status: DC
  Filled 2021-01-06: qty 30, 30d supply, fill #0

## 2021-01-06 NOTE — Patient Instructions (Signed)
Managing Your Hypertension Hypertension, also called high blood pressure, is when the force of the blood pressing against the walls of the arteries is too strong. Arteries are blood vessels that carry blood from your heart throughout your body. Hypertension forces the heart to work harder to pump blood and may cause the arteries tobecome narrow or stiff. Understanding blood pressure readings Your personal target blood pressure may vary depending on your medical conditions, your age, and other factors. A blood pressure reading includes a higher number over a lower number. Ideally, your blood pressure should be below 120/80. You should know that: The first, or top, number is called the systolic pressure. It is a measure of the pressure in your arteries as your heart beats. The second, or bottom number, is called the diastolic pressure. It is a measure of the pressure in your arteries as the heart relaxes. Blood pressure is classified into four stages. Based on your blood pressure reading, your health care provider may use the following stages to determine what type of treatment you need, if any. Systolic pressure and diastolicpressure are measured in a unit called mmHg. Normal Systolic pressure: below 120. Diastolic pressure: below 80. Elevated Systolic pressure: 120-129. Diastolic pressure: below 80. Hypertension stage 1 Systolic pressure: 130-139. Diastolic pressure: 80-89. Hypertension stage 2 Systolic pressure: 140 or above. Diastolic pressure: 90 or above. How can this condition affect me? Managing your hypertension is an important responsibility. Over time, hypertension can damage the arteries and decrease blood flow to important parts of the body, including the brain, heart, and kidneys. Having untreated or uncontrolled hypertension can lead to: A heart attack. A stroke. A weakened blood vessel (aneurysm). Heart failure. Kidney damage. Eye damage. Metabolic syndrome. Memory and  concentration problems. Vascular dementia. What actions can I take to manage this condition? Hypertension can be managed by making lifestyle changes and possibly by taking medicines. Your health care provider will help you make a plan to bring yourblood pressure within a normal range. Nutrition  Eat a diet that is high in fiber and potassium, and low in salt (sodium), added sugar, and fat. An example eating plan is called the Dietary Approaches to Stop Hypertension (DASH) diet. To eat this way: Eat plenty of fresh fruits and vegetables. Try to fill one-half of your plate at each meal with fruits and vegetables. Eat whole grains, such as whole-wheat pasta, brown rice, or whole-grain bread. Fill about one-fourth of your plate with whole grains. Eat low-fat dairy products. Avoid fatty cuts of meat, processed or cured meats, and poultry with skin. Fill about one-fourth of your plate with lean proteins such as fish, chicken without skin, beans, eggs, and tofu. Avoid pre-made and processed foods. These tend to be higher in sodium, added sugar, and fat. Reduce your daily sodium intake. Most people with hypertension should eat less than 1,500 mg of sodium a day.  Lifestyle  Work with your health care provider to maintain a healthy body weight or to lose weight. Ask what an ideal weight is for you. Get at least 30 minutes of exercise that causes your heart to beat faster (aerobic exercise) most days of the week. Activities may include walking, swimming, or biking. Include exercise to strengthen your muscles (resistance exercise), such as weight lifting, as part of your weekly exercise routine. Try to do these types of exercises for 30 minutes at least 3 days a week. Do not use any products that contain nicotine or tobacco, such as cigarettes, e-cigarettes, and chewing   tobacco. If you need help quitting, ask your health care provider. Control any long-term (chronic) conditions you have, such as high  cholesterol or diabetes. Identify your sources of stress and find ways to manage stress. This may include meditation, deep breathing, or making time for fun activities.  Alcohol use Do not drink alcohol if: Your health care provider tells you not to drink. You are pregnant, may be pregnant, or are planning to become pregnant. If you drink alcohol: Limit how much you use to: 0-1 drink a day for women. 0-2 drinks a day for men. Be aware of how much alcohol is in your drink. In the [place] one drink equals one 12 oz bottle of beer (355 mL), one 5 oz glass of wine (148 mL), or one 1 oz glass of hard liquor (44 mL). Medicines Your health care provider may prescribe medicine if lifestyle changes are not enough to get your blood pressure under control and if: Your systolic blood pressure is 130 or higher. Your diastolic blood pressure is 80 or higher. Take medicines only as told by your health care provider. Follow the directions carefully. Blood pressure medicines must be taken as told by your health care provider. The medicine does not work as well when you skip doses. Skippingdoses also puts you at risk for problems. Monitoring Before you monitor your blood pressure: Do not smoke, drink caffeinated beverages, or exercise within 30 minutes before taking a measurement. Use the bathroom and empty your bladder (urinate). Sit quietly for at least 5 minutes before taking measurements. Monitor your blood pressure at home as told by your health care provider. To do this: Sit with your back straight and supported. Place your feet flat on the floor. Do not cross your legs. Support your arm on a flat surface, such as a table. Make sure your upper arm is at heart level. Each time you measure, take two or three readings one minute apart and record the results. You may also need to have your blood pressure checked regularly by your healthcare provider. General information Talk with your health care  provider about your diet, exercise habits, and other lifestyle factors that may be contributing to hypertension. Review all the medicines you take with your health care provider because there may be side effects or interactions. Keep all visits as told by your health care provider. Your health care provider can help you create and adjust your plan for managing your high blood pressure. Where to find more information National Heart, Lung, and Blood Institute: www.nhlbi.nih.gov American Heart Association: www.heart.org Contact a health care provider if: You think you are having a reaction to medicines you have taken. You have repeated (recurrent) headaches. You feel dizzy. You have swelling in your ankles. You have trouble with your vision. Get help right away if: You develop a severe headache or confusion. You have unusual weakness or numbness, or you feel faint. You have severe pain in your chest or abdomen. You vomit repeatedly. You have trouble breathing. These symptoms may represent a serious problem that is an emergency. Do not wait to see if the symptoms will go away. Get medical help right away. Call your local emergency services (911 in the [place] Do not drive yourself to the hospital. Summary Hypertension is when the force of blood pumping through your arteries is too strong. If this condition is not controlled, it may put you at risk for serious complications. Your personal target blood pressure may vary depending on your medical conditions,   your age, and other factors. For most people, a normal blood pressure is less than 120/80. Hypertension is managed by lifestyle changes, medicines, or both. Lifestyle changes to help manage hypertension include losing weight, eating a healthy, low-sodium diet, exercising more, stopping smoking, and limiting alcohol. This information is not intended to replace advice given to you by your health care provider. Make sure you discuss any questions  you have with your healthcare provider. Document Revised: 06/16/2019 Document Reviewed: 04/11/2019 Elsevier Patient Education  2022 Elsevier Inc.  

## 2021-01-06 NOTE — Progress Notes (Signed)
Subjective:  Patient ID: Jacob [name], male    DOB: 10-06-1975  Age: 45 y.o. MRN: [id number]  CC: Diabetes   HPI Jacob [name] is a 45 y.o. year old male with a history of hypertension, hyperlipidemia, type 2 diabetes mellitus (A1c 9.7)  Interval History: He was last seen by the clinical pharmacist 1 month ago. His blood sugars range 110-157 and he endorses compliance with his medications. Sometimes has tingling and numbness in his feet but this is intermittent. Also has periodic itching on his soles. Not up-to-date on his annual eye exam but states he did see an ophthalmologist 2 years ago.  BP is elevated and he endorses compliance with his antihypertensives. He has no chest pain or dyspnea and has no additional concerns. Past Medical History:  Diagnosis Date   Allergy    Diabetes mellitus without complication ([place])    Hypertension     Past Surgical History:  Procedure Laterality Date   APPENDECTOMY     LAPAROSCOPIC APPENDECTOMY  10/24/2013   Procedure: APPENDECTOMY LAPAROSCOPIC;  Surgeon: Shann Medal, MD;  Location: [facility];  Service: General;;    Family History  Problem Relation Age of Onset   Asthma Son     No Known Allergies  Outpatient Medications Prior to Visit  Medication Sig Dispense Refill   albuterol (VENTOLIN HFA) 108 (90 Base) MCG/ACT inhaler Inhale 2 puffs into the lungs every 6 (six) hours as needed for wheezing or shortness of breath. 18 g 1   atorvastatin (LIPITOR) 20 MG tablet Take 1 tablet (20 mg total) by mouth daily. 30 tablet 1   Blood Glucose Monitoring Suppl (TRUE METRIX METER) w/Device KIT USE AS DIRECTED 1 kit 0   clotrimazole (LOTRIMIN) 1 % cream Apply 1 application topically 2 (two) times daily. 30 g 1   glipiZIDE (GLUCOTROL) 10 MG tablet Take 1 tablet (10 mg total) by mouth 2 (two) times daily before a meal. 180 tablet 1   glucose blood (TRUE METRIX BLOOD GLUCOSE TEST) test strip Use three times daily 100 each 12   TRUEplus Lancets 28G  MISC use 3 (three) times daily before meals. 100 each 12   Vitamin D, Ergocalciferol, (DRISDOL) 1.25 MG (50000 UNIT) CAPS capsule Take 1 capsule (50,000 Units total) by mouth every 7 (seven) days. 4 capsule 2   lisinopril (ZESTRIL) 10 MG tablet Take 1 tablet (10 mg total) by mouth daily. 30 tablet 1   metFORMIN (GLUCOPHAGE) 500 MG tablet Take 1 tablet (500 mg total) by mouth 2 (two) times daily with a meal. 60 tablet 1   Facility-Administered Medications Prior to Visit  Medication Dose Route Frequency Provider Last Rate Last Admin   cloNIDine (CATAPRES) tablet 0.2 mg  0.2 mg Oral Once Mayers, Cari S, PA-C         ROS Review of Systems  Constitutional:  Negative for activity change and appetite change.  HENT:  Negative for sinus pressure and sore throat.   Eyes:  Negative for visual disturbance.  Respiratory:  Negative for cough, chest tightness and shortness of breath.   Cardiovascular:  Negative for chest pain and leg swelling.  Gastrointestinal:  Negative for abdominal distention, abdominal pain, constipation and diarrhea.  Endocrine: Negative.   Genitourinary:  Negative for dysuria.  Musculoskeletal:  Negative for joint swelling and myalgias.  Skin:  Negative for rash.  Allergic/Immunologic: Negative.   Neurological:  Positive for numbness. Negative for weakness and light-headedness.  Psychiatric/Behavioral:  Negative for dysphoric mood and suicidal  ideas.    Objective:  BP (!) 178/118   Pulse 78   Ht 6' 1" (1.854 m)   Wt 231 lb (104.8 kg)   SpO2 99%   BMI 30.48 kg/m   BP/Weight 01/06/2021 7/35/3299 06/28/2681  Systolic BP 419 622 297  Diastolic BP 989 211 941  Wt. (Lbs) 231 - 228  BMI 30.48 - 30.08      Physical Exam Constitutional:      Appearance: He is well-developed.  Cardiovascular:     Rate and Rhythm: Normal rate.     Heart sounds: Normal heart sounds. No murmur heard. Pulmonary:     Effort: Pulmonary effort is normal.     Breath sounds: Normal breath  sounds. No wheezing or rales.  Chest:     Chest wall: No tenderness.  Abdominal:     General: Bowel sounds are normal. There is no distension.     Palpations: Abdomen is soft. There is no mass.     Tenderness: There is no abdominal tenderness.  Musculoskeletal:        General: Normal range of motion.     Right lower leg: No edema.     Left lower leg: No edema.  Neurological:     Mental Status: He is alert and oriented to person, place, and time.  Psychiatric:        Mood and Affect: Mood normal.   Diabetic Foot Exam - Simple   Simple Foot Form Diabetic Foot exam was performed with the following findings: Yes 01/06/2021 11:17 AM  Visual Inspection No deformities, no ulcerations, no other skin breakdown bilaterally: Yes Sensation Testing Intact to touch and monofilament testing bilaterally: Yes Pulse Check Posterior Tibialis and Dorsalis pulse intact bilaterally: Yes Comments     CMP Latest Ref Rng & Units 10/31/2020 06/28/2018 02/23/2018  Glucose mg/dL CANCELED 180(H) 304(H)  BUN 6 - 24 mg/dL _0 Creatinine 0.76 - 1.27 mg/dL 1.02 1.03 0.98  Sodium 134 - 144 mmol/L 141 142 141  Potassium mmol/L CANCELED 4.4 3.9  Chloride 96 - 106 mmol/L 96 102 100  CO2 20 - 29 mmol/L - 25 24  Calcium 8.7 - 10.2 mg/dL 9.6 9.7 9.3  Total Protein 6.0 - 8.5 g/dL 7.3 7.2 7.2  Total Bilirubin 0.0 - 1.2 mg/dL 0.8 1.0 1.0  Alkaline Phos 44 - 121 IU/L 135(H) 107 135(H)  AST 0 - 40 IU/L 24 29 40  ALT 0 - 44 IU/L - 15 26    Lipid Panel     Component Value Date/Time   CHOL 229 (H) 10/31/2020 1157   TRIG 191 (H) 10/31/2020 1157   HDL 42 10/31/2020 1157   CHOLHDL 5.5 (H) 10/31/2020 1157   LDLCALC 152 (H) 10/31/2020 1157    CBC    Component Value Date/Time   WBC 6.6 10/31/2020 1157   WBC 10.2 10/26/2013 0430   RBC 5.49 10/31/2020 1157   RBC 5.10 10/26/2013 0430   HGB 15.8 10/31/2020 1157   HCT 46.5 10/31/2020 1157   PLT 188 10/31/2020 1157   MCV 85 10/31/2020 1157   MCH 28.8  10/31/2020 1157   MCH 29.8 10/26/2013 0430   MCHC 34.0 10/31/2020 1157   MCHC 35.5 10/26/2013 0430   RDW 13.5 10/31/2020 1157   LYMPHSABS 2.3 10/31/2020 1157   MONOABS 0.7 10/26/2013 0430   EOSABS 0.1 10/31/2020 1157   BASOSABS 0.0 10/31/2020 1157    Lab Results  Component Value Date   HGBA1C 9.7 (A) 10/31/2020  Assessment & Plan:  1. Type 2 diabetes mellitus with diabetic polyneuropathy, without long-term current use of insulin (HCC) Uncontrolled with A1c of 9.7 Goal is less than 7.0 Blood sugar log reveals he is starting to have some improvement Metformin dose increased from 500 mg twice daily 2000 mg twice daily Continue glipizide Advised to notify the clinic if blood sugar drops Neuropathic symptoms are intermittent and he would like to hold off on medications at this time but will notify me if symptoms worsen. Counseled on Diabetic diet, my plate method, 465 minutes of moderate intensity exercise/week Blood sugar logs with fasting goals of 80-120 mg/dl, random of less than 180 and in the event of sugars less than 60 mg/dl or greater than 400 mg/dl encouraged to notify the clinic. Advised on the need for annual eye exams, annual foot exams, Pneumonia vaccine. - POCT glucose (manual entry) - metFORMIN (GLUCOPHAGE) 500 MG tablet; Take 2 tablets (1,000 mg total) by mouth 2 (two) times daily with a meal.  Dispense: 120 tablet; Refill: 6  2. Hypertension associated with diabetes ([place]) Uncontrolled Switched from lisinopril to lisinopril/HCTZ He will need a basic metabolic panel next week to check his creatinine and potassium I will have him follow-up with Pharm.D. for blood pressure evaluation in 1 week Counseled on blood pressure goal of less than 130/80, low-sodium, DASH diet, medication compliance, 150 minutes of moderate intensity exercise per week. Discussed medication compliance, adverse effects. - lisinopril-hydrochlorothiazide (ZESTORETIC) 20-25 MG tablet; Take 1 tablet  by mouth daily.  Dispense: 30 tablet; Refill: 6  Meds ordered this encounter  Medications   metFORMIN (GLUCOPHAGE) 500 MG tablet    Sig: Take 2 tablets (1,000 mg total) by mouth 2 (two) times daily with a meal.    Dispense:  120 tablet    Refill:  6    Dose change   lisinopril-hydrochlorothiazide (ZESTORETIC) 20-25 MG tablet    Sig: Take 1 tablet by mouth daily.    Dispense:  30 tablet    Refill:  6    Discontinue Lisinopril    Follow-up: Return in about 2 weeks (around 01/20/2021) for BP evaluation; 3 months with PCP-diabetes mellitus.       Charlott Rakes, MD, FAAFP. [facility] [place], [place]   01/06/2021, 11:18 AM

## 2021-01-07 ENCOUNTER — Other Ambulatory Visit: Payer: Self-pay | Admitting: Physician Assistant

## 2021-01-07 DIAGNOSIS — E782 Mixed hyperlipidemia: Secondary | ICD-10-CM

## 2021-01-10 ENCOUNTER — Other Ambulatory Visit: Payer: Self-pay

## 2021-01-10 MED ORDER — ATORVASTATIN CALCIUM 20 MG PO TABS
20.0000 mg | ORAL_TABLET | Freq: Every day | ORAL | 0 refills | Status: DC
  Filled 2021-01-10: qty 30, 30d supply, fill #0

## 2021-01-13 ENCOUNTER — Other Ambulatory Visit: Payer: Self-pay

## 2021-01-13 ENCOUNTER — Ambulatory Visit: Payer: Self-pay | Admitting: *Deleted

## 2021-01-13 NOTE — Telephone Encounter (Signed)
is calling because he recently started taking Rx #: [id number] lisinopril-hydrochlorothiazide (ZESTORETIC) 20-25 MG tablet [[id number]] . Pt experiencing burning in both legs. This started on Friday or Saturday.   Called patient to review symptoms. Patient c/o bilateral legs noted with weakness, "charlie horse"  at times since starting medication Friday and Saturday. Patient reports he has started drinking Gatorade and symptoms decreased but come back. Denies dizziness , lightheadedness, chest pain difficulty breathing. Patient reports he will increase fluid intake and eat bananas for muscle cramps in back of legs. Please advise regarding medication. Care advise given. Patient verbalized understanding of care advise and to call back or go to ED if symptoms worsen.

## 2021-01-13 NOTE — Telephone Encounter (Signed)
Reason for Disposition  [1] Caller has NON-URGENT medicine question about med that PCP prescribed AND [2] triager unable to answer question  Answer Assessment - Initial Assessment Questions 1. NAME of MEDICATION: "What medicine are you calling about?"     zestoretic 2. QUESTION: "What is your question?" (e.g., double dose of medicine, side effect)     Bilateral legs feel like "charlie horse" in legs at night.  3. PRESCRIBING HCP: "Who prescribed it?" Reason: if prescribed by specialist, call should be referred to that group.     PCP 4. SYMPTOMS: "Do you have any symptoms?"     Weakness in legs, cramps 5. SEVERITY: If symptoms are present, ask "Are they mild, moderate or severe?"     Cramps difficulty at night  6. PREGNANCY:  "Is there any chance that you are pregnant?" "When was your last menstrual period?"     na  Protocols used: Medication Question Call-A-AH

## 2021-01-14 ENCOUNTER — Other Ambulatory Visit: Payer: Self-pay

## 2021-01-14 MED ORDER — LISINOPRIL 10 MG PO TABS
10.0000 mg | ORAL_TABLET | Freq: Every day | ORAL | 3 refills | Status: DC
  Filled 2021-01-14: qty 30, 30d supply, fill #0
  Filled 2021-02-24: qty 30, 30d supply, fill #1
  Filled 2021-04-01: qty 30, 30d supply, fill #2
  Filled 2021-05-09: qty 30, 30d supply, fill #3

## 2021-01-14 MED ORDER — AMLODIPINE BESYLATE 5 MG PO TABS
5.0000 mg | ORAL_TABLET | Freq: Every day | ORAL | 3 refills | Status: DC
  Filled 2021-01-14: qty 30, 30d supply, fill #0
  Filled 2021-02-24: qty 30, 30d supply, fill #1

## 2021-01-14 NOTE — Addendum Note (Signed)
Addended by: Hoy Register on: 01/14/2021 12:54 PM   Modules accepted: Orders

## 2021-01-14 NOTE — Telephone Encounter (Signed)
Please advise her that I have switched him from lisinopril/HCTZ back to lisinopril and added amlodipine for better blood pressure control.  Please advised to keep his appointment with [facility] for BP evaluation.

## 2021-01-14 NOTE — Telephone Encounter (Signed)
Routing to PCP for review.

## 2021-01-15 ENCOUNTER — Other Ambulatory Visit: Payer: Self-pay

## 2021-01-16 NOTE — Telephone Encounter (Signed)
Pt was called and informed of medication change and medication being sent to pharmacy.

## 2021-01-20 ENCOUNTER — Other Ambulatory Visit: Payer: Self-pay

## 2021-02-04 NOTE — Progress Notes (Unsigned)
S:    PCP: Dr. Alvis Lemmings  Patient presents for hypertension evaluation, education, and management. Patient was referred and last seen by Dr. Alvis Lemmings on 01/06/21, when BP was 178/118. Increased lisinopril and switched to lisinopril-HCTZ, however patient reported bilateral muscle cramping so this was switched to lisinopril alone and amlodipine added. Metformin was also increased to 2000 mg BID. When previously seen by CPP in July patient declined medication changes in favor of lifestyle interventions.   Today, *** -checking BG/BP at home?  -doing ok with amlodipine?  -check bmet today after incr lisinopril -adherence?   Family/Social History:  -Fhx: no pertinent positives  -Tobacco: never smoker -Alcohol: none currently   Insurance coverage/medication affordability: BCBS  Medication adherence reported.   Current diabetes medications include: glipizide 10 mg BID, metformin 2000 mg BID Current hypertension medications include: lisinopril 20 mg daily, amlodipine 5 mg daily Current hyperlipidemia medications include: atorvastatin 20 mg daily   Patient reported dietary habits: -Reports that he has limited his portion sizes  -He admits to sweets and indulging in carbs occasionally but has decreased this since seeing Cari  Patient-reported exercise habits:  -None outside of work -He wants to get back in a gym or start walking    Patient denies polyuria. Patient denies neuropathy (nerve pain). Patient denies visual changes. Patient reports self foot exams.     O:  Lab Results  Component Value Date   HGBA1C 9.7 (A) 10/31/2020   There were no vitals filed for this visit.   Lipid Panel     Component Value Date/Time   CHOL 229 (H) 10/31/2020 1157   TRIG 191 (H) 10/31/2020 1157   HDL 42 10/31/2020 1157   CHOLHDL 5.5 (H) 10/31/2020 1157   LDLCALC 152 (H) 10/31/2020 1157    Home fasting blood sugars: not checking  2 hour post-meal/random blood sugars: not checking.  Clinical  Atherosclerotic Cardiovascular Disease (ASCVD): No  The 10-year ASCVD risk score (Arnett DK, et al., 2019) is: 26.7%   Values used to calculate the score:     Age: 68 years     Sex: Male     Is Non-Hispanic African American: Yes     Diabetic: Yes     Tobacco smoker: No     Systolic Blood Pressure: 178 mmHg     Is BP treated: Yes     HDL Cholesterol: 42 mg/dL     Total Cholesterol: 229 mg/dL   A/P: Diabetes longstanding currently uncontrolled based on A1c. Patient is able to verbalize appropriate hypoglycemia management plan. Medication adherence appears to be appropriate at this time. His control cannot be properly assessed as he is not checking home CBGs. Additionally, he endorses occasional symptoms of hypoglycemia but we do not have any readings. Hence, I cannot recommend any further changes at this time. I did emphasize lifestyle changes and the need for home glucose monitoring. He will pick up his glucose testing supplies after his visit with me today.  -Continued current regimen.  -Reordered True Metrix supplies for him. Advised him to pick these up and bring readings to Dr. Alvis Lemmings next month.  -Extensively discussed pathophysiology of diabetes, recommended lifestyle interventions, dietary effects on blood sugar control -Counseled on s/sx of and management of hypoglycemia -Next A1C anticipated ***  ASCVD risk - primary prevention in patient with diabetes. Last LDL is not controlled secondary to medication noncompliance. Since his visit with Cari, he is now taking his atorvastatin daily. ASCVD risk score is >20%. I recommend high  intensity statin, however, he declines any changes today.   -Continued atorvastatin 20 mg.   Hypertension longstanding currently above goal.  Blood pressure goal = 130/80 mmHg. Medication adherence is improving. His BP today is better with improved adherence to lisinopril. I recommended additional medication as he is still pretty elevated today. He declines  this. He understands that if BP is elevated to this degree, Dr. Alvis Lemmings will likely add or titrate dose of current medication. He understands this but wishes to optimize lifestyle today.  -Continue lisinopril 10 mg daily.   Written patient instructions provided.  Total time in face to face counseling 30 minutes.   Follow up PCP Clinic Visit in 1 month.   Butch Penny, PharmD, [place], CPP Clinical Pharmacist [facility] [phone]

## 2021-02-05 ENCOUNTER — Ambulatory Visit: Payer: BC Managed Care – PPO | Admitting: Pharmacist

## 2021-02-24 ENCOUNTER — Other Ambulatory Visit: Payer: Self-pay

## 2021-02-24 ENCOUNTER — Other Ambulatory Visit: Payer: Self-pay | Admitting: Family Medicine

## 2021-02-24 DIAGNOSIS — E782 Mixed hyperlipidemia: Secondary | ICD-10-CM

## 2021-02-24 MED ORDER — ATORVASTATIN CALCIUM 20 MG PO TABS
20.0000 mg | ORAL_TABLET | Freq: Every day | ORAL | 0 refills | Status: DC
  Filled 2021-02-24: qty 30, 30d supply, fill #0

## 2021-03-20 ENCOUNTER — Other Ambulatory Visit: Payer: Self-pay

## 2021-03-20 ENCOUNTER — Encounter: Payer: Self-pay | Admitting: Family Medicine

## 2021-03-20 ENCOUNTER — Ambulatory Visit: Payer: BC Managed Care – PPO | Attending: Family Medicine | Admitting: Family Medicine

## 2021-03-20 VITALS — BP 191/114 | HR 70 | Ht 73.0 in | Wt 237.0 lb

## 2021-03-20 DIAGNOSIS — E785 Hyperlipidemia, unspecified: Secondary | ICD-10-CM | POA: Diagnosis not present

## 2021-03-20 DIAGNOSIS — R002 Palpitations: Secondary | ICD-10-CM | POA: Insufficient documentation

## 2021-03-20 DIAGNOSIS — Z7984 Long term (current) use of oral hypoglycemic drugs: Secondary | ICD-10-CM | POA: Diagnosis not present

## 2021-03-20 DIAGNOSIS — Z79899 Other long term (current) drug therapy: Secondary | ICD-10-CM | POA: Insufficient documentation

## 2021-03-20 DIAGNOSIS — E119 Type 2 diabetes mellitus without complications: Secondary | ICD-10-CM | POA: Insufficient documentation

## 2021-03-20 DIAGNOSIS — I499 Cardiac arrhythmia, unspecified: Secondary | ICD-10-CM | POA: Insufficient documentation

## 2021-03-20 DIAGNOSIS — Z7901 Long term (current) use of anticoagulants: Secondary | ICD-10-CM | POA: Insufficient documentation

## 2021-03-20 DIAGNOSIS — I10 Essential (primary) hypertension: Secondary | ICD-10-CM | POA: Diagnosis not present

## 2021-03-20 MED ORDER — AMLODIPINE BESYLATE 5 MG PO TABS
5.0000 mg | ORAL_TABLET | Freq: Every day | ORAL | 3 refills | Status: DC
  Filled 2021-03-20: qty 30, 30d supply, fill #0
  Filled 2021-04-01: qty 30, 30d supply, fill #1

## 2021-03-20 NOTE — Progress Notes (Signed)
States that heart was beating very fast over the weekend.  Stopped Amlodipine.

## 2021-03-20 NOTE — Progress Notes (Signed)
Subjective:  Patient ID: Jacob [name], male    DOB: 24-Jul-1975  Age: 45 y.o. MRN: [id number]  CC: Irregular Heart Beat   HPI Jacob [name] is a 45 y.o. year old male with a history of hypertension, hyperlipidemia, type 2 diabetes mellitus (A1c 9.7)  Interval History: Over the weekend he experienced fluttering of his heart which was visible through his shirt and lasted a few minutes. He had no dizziness, chest pain, dyspnea. He has not had any repeat episodes.  He stopped Amlodipine as it caused cramps in his legs.  At his last visit he had complained of cramps with lisinopril/HCTZ which was subsequently changed to lisinopril. His BP is elevated and he had been taking OTC cold remedies.  He also admits he is yet to take his antihypertensive today.  Past Medical History:  Diagnosis Date   Allergy    Diabetes mellitus without complication ([place])    Hypertension     Past Surgical History:  Procedure Laterality Date   APPENDECTOMY     LAPAROSCOPIC APPENDECTOMY  10/24/2013   Procedure: APPENDECTOMY LAPAROSCOPIC;  Surgeon: Shann Medal, MD;  Location: [facility];  Service: General;;    Family History  Problem Relation Age of Onset   Asthma Son     Allergies  Allergen Reactions   Hydrochlorothiazide Other (See Comments)    Bilateral muscle cramping    Outpatient Medications Prior to Visit  Medication Sig Dispense Refill   albuterol (VENTOLIN HFA) 108 (90 Base) MCG/ACT inhaler Inhale 2 puffs into the lungs every 6 (six) hours as needed for wheezing or shortness of breath. 18 g 1   atorvastatin (LIPITOR) 20 MG tablet Take 1 tablet (20 mg total) by mouth daily. 30 tablet 0   Blood Glucose Monitoring Suppl (TRUE METRIX METER) w/Device KIT USE AS DIRECTED 1 kit 0   clotrimazole (LOTRIMIN) 1 % cream Apply 1 application topically 2 (two) times daily. 30 g 1   glipiZIDE (GLUCOTROL) 10 MG tablet Take 1 tablet (10 mg total) by mouth 2 (two) times daily before a meal. 180 tablet 1    glucose blood (TRUE METRIX BLOOD GLUCOSE TEST) test strip Use three times daily 100 each 12   lisinopril (ZESTRIL) 10 MG tablet Take 1 tablet (10 mg total) by mouth daily. 30 tablet 3   metFORMIN (GLUCOPHAGE) 500 MG tablet Take 2 tablets (1,000 mg total) by mouth 2 (two) times daily with a meal. 120 tablet 6   TRUEplus Lancets 28G MISC use 3 (three) times daily before meals. 100 each 12   Vitamin D, Ergocalciferol, (DRISDOL) 1.25 MG (50000 UNIT) CAPS capsule Take 1 capsule (50,000 Units total) by mouth every 7 (seven) days. 4 capsule 2   amLODipine (NORVASC) 5 MG tablet Take 1 tablet (5 mg total) by mouth daily. (Patient not taking: Reported on 03/20/2021) 30 tablet 3   Facility-Administered Medications Prior to Visit  Medication Dose Route Frequency Provider Last Rate Last Admin   cloNIDine (CATAPRES) tablet 0.2 mg  0.2 mg Oral Once Mayers, Cari S, PA-C         ROS Review of Systems  Constitutional:  Negative for activity change and appetite change.  HENT:  Negative for sinus pressure and sore throat.   Eyes:  Negative for visual disturbance.  Respiratory:  Negative for cough, chest tightness and shortness of breath.   Cardiovascular:  Negative for chest pain and leg swelling.  Gastrointestinal:  Negative for abdominal distention, abdominal pain, constipation and diarrhea.  Endocrine: Negative.   Genitourinary:  Negative for dysuria.  Musculoskeletal:  Negative for joint swelling and myalgias.  Skin:  Negative for rash.  Allergic/Immunologic: Negative.   Neurological:  Negative for weakness, light-headedness and numbness.  Psychiatric/Behavioral:  Negative for dysphoric mood and suicidal ideas.    Objective:  BP (!) 191/114   Pulse 70   Ht _0  (1.854 m)   Wt 237 lb (107.5 kg)   SpO2 99%   BMI 31.27 kg/m   BP/Weight 03/20/2021 01/06/2021 3/55/7322  Systolic BP 025 427 062  Diastolic BP 376 283 151  Wt. (Lbs) 237 231 -  BMI 31.27 30.48 -      Physical  Exam Constitutional:      Appearance: He is well-developed.  Cardiovascular:     Rate and Rhythm: Normal rate.     Heart sounds: Normal heart sounds. No murmur heard. Pulmonary:     Effort: Pulmonary effort is normal.     Breath sounds: Normal breath sounds. No wheezing or rales.  Chest:     Chest wall: No tenderness.  Abdominal:     General: Bowel sounds are normal. There is no distension.     Palpations: Abdomen is soft. There is no mass.     Tenderness: There is no abdominal tenderness.  Musculoskeletal:        General: Normal range of motion.     Right lower leg: No edema.     Left lower leg: No edema.  Neurological:     Mental Status: He is alert and oriented to person, place, and time.  Psychiatric:        Mood and Affect: Mood normal.    CMP Latest Ref Rng & Units 10/31/2020 06/28/2018 02/23/2018  Glucose mg/dL CANCELED 180(H) 304(H)  BUN 6 - 24 mg/dL _1 Creatinine 0.76 - 1.27 mg/dL 1.02 1.03 0.98  Sodium 134 - 144 mmol/L 141 142 141  Potassium mmol/L CANCELED 4.4 3.9  Chloride 96 - 106 mmol/L 96 102 100  CO2 20 - 29 mmol/L - 25 24  Calcium 8.7 - 10.2 mg/dL 9.6 9.7 9.3  Total Protein 6.0 - 8.5 g/dL 7.3 7.2 7.2  Total Bilirubin 0.0 - 1.2 mg/dL 0.8 1.0 1.0  Alkaline Phos 44 - 121 IU/L 135(H) 107 135(H)  AST 0 - 40 IU/L 24 29 40  ALT 0 - 44 IU/L - 15 26    Lipid Panel     Component Value Date/Time   CHOL 229 (H) 10/31/2020 1157   TRIG 191 (H) 10/31/2020 1157   HDL 42 10/31/2020 1157   CHOLHDL 5.5 (H) 10/31/2020 1157   LDLCALC 152 (H) 10/31/2020 1157    CBC    Component Value Date/Time   WBC 6.6 10/31/2020 1157   WBC 10.2 10/26/2013 0430   RBC 5.49 10/31/2020 1157   RBC 5.10 10/26/2013 0430   HGB 15.8 10/31/2020 1157   HCT 46.5 10/31/2020 1157   PLT 188 10/31/2020 1157   MCV 85 10/31/2020 1157   MCH 28.8 10/31/2020 1157   MCH 29.8 10/26/2013 0430   MCHC 34.0 10/31/2020 1157   MCHC 35.5 10/26/2013 0430   RDW 13.5 10/31/2020 1157   LYMPHSABS 2.3  10/31/2020 1157   MONOABS 0.7 10/26/2013 0430   EOSABS 0.1 10/31/2020 1157   BASOSABS 0.0 10/31/2020 1157    Lab Results  Component Value Date   HGBA1C 9.7 (A) 10/31/2020    Assessment & Plan:  1. Accelerated hypertension Uncontrolled due to the  fact that he is yet to take his antihypertensives Advised to resume his lisinopril He is willing to restart amlodipine as on further questioning he is unsure if cramps are related to amlodipine Advised to stay hydrated Will reassess blood pressure next visit Counseled on blood pressure goal of less than 130/80, low-sodium, DASH diet, medication compliance, 150 minutes of moderate intensity exercise per week. Discussed medication compliance, adverse effects. - amLODipine (NORVASC) 5 MG tablet; Take 1 tablet (5 mg total) by mouth daily.  Dispense: 30 tablet; Refill: 3 - LP+Non-HDL Cholesterol - CMP14+EGFR  2. Fluttering sensation of heart EKG negative for ST changes, negative for A. fib a flutter We will check thyroid panel Anxiety could also be playing a role Advised that if he has repeat episodes he needs to contact me and I will place referral to cardiology for possible 30-day event monitor - T4, free - TSH   Meds ordered this encounter  Medications   amLODipine (NORVASC) 5 MG tablet    Sig: Take 1 tablet (5 mg total) by mouth daily.    Dispense:  30 tablet    Refill:  3    Follow-up: Return for Medical conditions, keep previously scheduled appointment.Charlott Rakes, MD, FAAFP. [facility] and [place], [place]   03/20/2021, 1:14 PM

## 2021-03-21 ENCOUNTER — Telehealth: Payer: Self-pay

## 2021-03-21 LAB — CMP14+EGFR
ALT: 15 IU/L (ref 0–44)
AST: 22 IU/L (ref 0–40)
Albumin/Globulin Ratio: 1.4 (ref 1.2–2.2)
Albumin: 4.2 g/dL (ref 4.0–5.0)
Alkaline Phosphatase: 117 IU/L (ref 44–121)
BUN/Creatinine Ratio: 13 (ref 9–20)
BUN: 13 mg/dL (ref 6–24)
Bilirubin Total: 0.9 mg/dL (ref 0.0–1.2)
CO2: 26 mmol/L (ref 20–29)
Calcium: 9 mg/dL (ref 8.7–10.2)
Chloride: 101 mmol/L (ref 96–106)
Creatinine, Ser: 0.97 mg/dL (ref 0.76–1.27)
Globulin, Total: 3 g/dL (ref 1.5–4.5)
Glucose: 158 mg/dL — ABNORMAL HIGH (ref 70–99)
Potassium: 3.9 mmol/L (ref 3.5–5.2)
Sodium: 140 mmol/L (ref 134–144)
Total Protein: 7.2 g/dL (ref 6.0–8.5)
eGFR: 97 mL/min/{1.73_m2} (ref >59)

## 2021-03-21 LAB — LP+NON-HDL CHOLESTEROL
Cholesterol, Total: 147 mg/dL (ref 100–199)
HDL: 44 mg/dL (ref >39)
LDL Chol Calc (NIH): 81 mg/dL (ref 0–99)
Total Non-HDL-Chol (LDL+VLDL): 103 mg/dL (ref 0–129)
Triglycerides: 120 mg/dL (ref 0–149)
VLDL Cholesterol Cal: 22 mg/dL (ref 5–40)

## 2021-03-21 LAB — T4, FREE: Free T4: 1.36 ng/dL (ref 0.82–1.77)

## 2021-03-21 LAB — TSH: TSH: 1.8 u[IU]/mL (ref 0.450–4.500)

## 2021-03-21 NOTE — Telephone Encounter (Signed)
-----   Message from Hoy Register, MD sent at 03/21/2021  7:34 AM EDT ----- Please inform the patient that labs are normal. Thank you.

## 2021-03-21 NOTE — Telephone Encounter (Signed)
Patient name and DOB has been verified Patient was informed of lab results. Patient had no questions.  

## 2021-04-01 ENCOUNTER — Other Ambulatory Visit: Payer: Self-pay

## 2021-04-03 ENCOUNTER — Other Ambulatory Visit: Payer: Self-pay

## 2021-04-08 ENCOUNTER — Encounter: Payer: Self-pay | Admitting: Family Medicine

## 2021-04-08 ENCOUNTER — Other Ambulatory Visit: Payer: Self-pay

## 2021-04-08 ENCOUNTER — Ambulatory Visit: Payer: BC Managed Care – PPO | Attending: Family Medicine | Admitting: Family Medicine

## 2021-04-08 VITALS — BP 155/102 | HR 77 | Ht 73.0 in | Wt 233.4 lb

## 2021-04-08 DIAGNOSIS — E785 Hyperlipidemia, unspecified: Secondary | ICD-10-CM

## 2021-04-08 DIAGNOSIS — E1159 Type 2 diabetes mellitus with other circulatory complications: Secondary | ICD-10-CM | POA: Diagnosis not present

## 2021-04-08 DIAGNOSIS — I152 Hypertension secondary to endocrine disorders: Secondary | ICD-10-CM

## 2021-04-08 DIAGNOSIS — E1169 Type 2 diabetes mellitus with other specified complication: Secondary | ICD-10-CM

## 2021-04-08 DIAGNOSIS — E1142 Type 2 diabetes mellitus with diabetic polyneuropathy: Secondary | ICD-10-CM | POA: Diagnosis not present

## 2021-04-08 DIAGNOSIS — Z1211 Encounter for screening for malignant neoplasm of colon: Secondary | ICD-10-CM

## 2021-04-08 LAB — GLUCOSE, POCT (MANUAL RESULT ENTRY): POC Glucose: 253 mg/dl — AB (ref 70–99)

## 2021-04-08 LAB — POCT GLYCOSYLATED HEMOGLOBIN (HGB A1C): HbA1c, POC (controlled diabetic range): 7.6 % — AB (ref 0.0–7.0)

## 2021-04-08 MED ORDER — AMLODIPINE BESYLATE 10 MG PO TABS
10.0000 mg | ORAL_TABLET | Freq: Every day | ORAL | 1 refills | Status: DC
  Filled 2021-04-08: qty 30, 30d supply, fill #0
  Filled 2021-05-22: qty 30, 30d supply, fill #1
  Filled 2021-07-07: qty 30, 30d supply, fill #0
  Filled 2021-07-07: qty 30, 30d supply, fill #2
  Filled 2021-09-10: qty 30, 30d supply, fill #1

## 2021-04-08 MED ORDER — GLIPIZIDE 10 MG PO TABS
10.0000 mg | ORAL_TABLET | Freq: Two times a day (BID) | ORAL | 1 refills | Status: DC
Start: 2021-04-08 — End: 2021-10-08
  Filled 2021-04-08 – 2021-07-07 (×2): qty 60, 30d supply, fill #0
  Filled 2021-07-07 – 2021-09-10 (×2): qty 60, 30d supply, fill #1

## 2021-04-08 MED ORDER — ATORVASTATIN CALCIUM 20 MG PO TABS
20.0000 mg | ORAL_TABLET | Freq: Every day | ORAL | 1 refills | Status: DC
  Filled 2021-04-08: qty 30, 30d supply, fill #0
  Filled 2021-05-22: qty 30, 30d supply, fill #1
  Filled 2021-07-07: qty 30, 30d supply, fill #2
  Filled 2021-07-07: qty 30, 30d supply, fill #0
  Filled 2021-09-10: qty 30, 30d supply, fill #1

## 2021-04-08 NOTE — Progress Notes (Signed)
Subjective:  Patient ID: Jacob [name], male    DOB: [birth year]  Age: 45 y.o. MRN: [id number]  CC: Diabetes   Jacob [name] is a 45 y.o. year old male with a history of  hypertension, hyperlipidemia, type 2 diabetes mellitus (A1c 7.6).    Interval History: At his last visit he had stopped amlodipine due to complaints that they caused cramps.  His blood pressure was significantly elevated at 191/114 but has improved today to 155/102. He resumed the Amlodipine and is feeling okay without any cramps.  Also doing lisinopril.  He drank a cup of juice and ate a bowl of cereal prior to this visit and his CBG is 253. A1c is 7.6 down from 9.7 and he has been compliant with metformin and glipizide until 1 week ago when he ran out of glipizide. He states he has changed his diet and is cutting back on sodium.  The fluttering he had complained of at his last visit has resolved.  He endorses being under a lot of stress at work and is learning how to manage his stress. Past Medical History:  Diagnosis Date   Allergy    Diabetes mellitus without complication ([place])    Hypertension     Past Surgical History:  Procedure Laterality Date   APPENDECTOMY     LAPAROSCOPIC APPENDECTOMY  10/24/2013   Procedure: APPENDECTOMY LAPAROSCOPIC;  Surgeon: Shann Medal, MD;  Location: [facility];  Service: General;;    Family History  Problem Relation Age of Onset   Asthma Son     Allergies  Allergen Reactions   Hydrochlorothiazide Other (See Comments)    Bilateral muscle cramping    Outpatient Medications Prior to Visit  Medication Sig Dispense Refill   albuterol (VENTOLIN HFA) 108 (90 Base) MCG/ACT inhaler Inhale 2 puffs into the lungs every 6 (six) hours as needed for wheezing or shortness of breath. 18 g 1   Blood Glucose Monitoring Suppl (TRUE METRIX METER) w/Device KIT USE AS DIRECTED 1 kit 0   clotrimazole (LOTRIMIN) 1 % cream Apply 1 application topically 2 (two) times daily. 30 g 1    glucose blood (TRUE METRIX BLOOD GLUCOSE TEST) test strip Use three times daily 100 each 12   lisinopril (ZESTRIL) 10 MG tablet Take 1 tablet (10 mg total) by mouth daily. 30 tablet 3   metFORMIN (GLUCOPHAGE) 500 MG tablet Take 2 tablets (1,000 mg total) by mouth 2 (two) times daily with a meal. 120 tablet 6   TRUEplus Lancets 28G MISC use 3 (three) times daily before meals. 100 each 12   Vitamin D, Ergocalciferol, (DRISDOL) 1.25 MG (50000 UNIT) CAPS capsule Take 1 capsule (50,000 Units total) by mouth every 7 (seven) days. 4 capsule 2   amLODipine (NORVASC) 5 MG tablet Take 1 tablet (5 mg total) by mouth daily. 30 tablet 3   atorvastatin (LIPITOR) 20 MG tablet Take 1 tablet (20 mg total) by mouth daily. 30 tablet 0   glipiZIDE (GLUCOTROL) 10 MG tablet Take 1 tablet (10 mg total) by mouth 2 (two) times daily before a meal. 180 tablet 1   Facility-Administered Medications Prior to Visit  Medication Dose Route Frequency Provider Last Rate Last Admin   cloNIDine (CATAPRES) tablet 0.2 mg  0.2 mg Oral Once Mayers, Cari S, PA-C         ROS Review of Systems  Constitutional:  Negative for activity change and appetite change.  HENT:  Negative for sinus pressure and sore throat.  Eyes:  Negative for visual disturbance.  Respiratory:  Negative for cough, chest tightness and shortness of breath.   Cardiovascular:  Negative for chest pain and leg swelling.  Gastrointestinal:  Negative for abdominal distention, abdominal pain, constipation and diarrhea.  Endocrine: Negative.   Genitourinary:  Negative for dysuria.  Musculoskeletal:  Negative for joint swelling and myalgias.  Skin:  Negative for rash.  Allergic/Immunologic: Negative.   Neurological:  Negative for weakness, light-headedness and numbness.  Psychiatric/Behavioral:  Negative for dysphoric mood and suicidal ideas.    Objective:  BP (!) 155/102 (BP Location: Left Arm, Patient Position: Sitting, Cuff Size: Large)   Pulse 77   Ht _0   (1.854 m)   Wt 233 lb 6 oz (105.9 kg)   SpO2 99%   BMI 30.79 kg/m   BP/Weight 04/08/2021 03/20/2021 09/02/3011  Systolic BP 143 888 757  Diastolic BP 972 820 601  Wt. (Lbs) 233.38 237 231  BMI 30.79 31.27 30.48      Physical Exam Constitutional:      Appearance: He is well-developed.  Cardiovascular:     Rate and Rhythm: Normal rate.     Heart sounds: Normal heart sounds. No murmur heard. Pulmonary:     Effort: Pulmonary effort is normal.     Breath sounds: Normal breath sounds. No wheezing or rales.  Chest:     Chest wall: No tenderness.  Abdominal:     General: Bowel sounds are normal. There is no distension.     Palpations: Abdomen is soft. There is no mass.     Tenderness: There is no abdominal tenderness.  Musculoskeletal:        General: Normal range of motion.     Right lower leg: No edema.     Left lower leg: No edema.  Neurological:     Mental Status: He is alert and oriented to person, place, and time.  Psychiatric:        Mood and Affect: Mood normal.    CMP Latest Ref Rng & Units 03/20/2021 10/31/2020 06/28/2018  Glucose 70 - 99 mg/dL 158(H) CANCELED 180(H)  BUN 6 - 24 mg/dL _1 Creatinine 0.76 - 1.27 mg/dL 0.97 1.02 1.03  Sodium 134 - 144 mmol/L 140 141 142  Potassium 3.5 - 5.2 mmol/L 3.9 CANCELED 4.4  Chloride 96 - 106 mmol/L 101 96 102  CO2 20 - 29 mmol/L 26 - 25  Calcium 8.7 - 10.2 mg/dL 9.0 9.6 9.7  Total Protein 6.0 - 8.5 g/dL 7.2 7.3 7.2  Total Bilirubin 0.0 - 1.2 mg/dL 0.9 0.8 1.0  Alkaline Phos 44 - 121 IU/L 117 135(H) 107  AST 0 - 40 IU/L _2 ALT 0 - 44 IU/L 15 - 15    Lipid Panel     Component Value Date/Time   CHOL 147 03/20/2021 1119   TRIG 120 03/20/2021 1119   HDL 44 03/20/2021 1119   CHOLHDL 5.5 (H) 10/31/2020 1157   LDLCALC 81 03/20/2021 1119    CBC    Component Value Date/Time   WBC 6.6 10/31/2020 1157   WBC 10.2 10/26/2013 0430   RBC 5.49 10/31/2020 1157   RBC 5.10 10/26/2013 0430   HGB 15.8 10/31/2020 1157    HCT 46.5 10/31/2020 1157   PLT 188 10/31/2020 1157   MCV 85 10/31/2020 1157   MCH 28.8 10/31/2020 1157   MCH 29.8 10/26/2013 0430   MCHC 34.0 10/31/2020 1157   MCHC 35.5 10/26/2013 0430  RDW 13.5 10/31/2020 1157   LYMPHSABS 2.3 10/31/2020 1157   MONOABS 0.7 10/26/2013 0430   EOSABS 0.1 10/31/2020 1157   BASOSABS 0.0 10/31/2020 1157    Lab Results  Component Value Date   HGBA1C 7.6 (A) 04/08/2021    Assessment & Plan:  1. Type 2 diabetes mellitus with diabetic polyneuropathy, without long-term current use of insulin (HCC) Improved with A1c of 7.6 down from 9.7.; goal is less than 7.0 He has been commended on improvement Has been out of glipizide for 1 week hence I will make no additional changes Counseled on Diabetic diet, my plate method, 989 minutes of moderate intensity exercise/week Blood sugar logs with fasting goals of 80-120 mg/dl, random of less than 180 and in the event of sugars less than 60 mg/dl or greater than 400 mg/dl encouraged to notify the clinic. Advised on the need for annual eye exams, annual foot exams, Pneumonia vaccine. - POCT glucose (manual entry) - POCT glycosylated hemoglobin (Hb A1C) - glipiZIDE (GLUCOTROL) 10 MG tablet; Take 1 tablet (10 mg total) by mouth 2 (two) times daily before a meal.  Dispense: 180 tablet; Refill: 1 - Ambulatory referral to Ophthalmology  2. Hyperlipidemia associated with type 2 diabetes mellitus (HCC) Controlled Low-cholesterol diet - atorvastatin (LIPITOR) 20 MG tablet; Take 1 tablet (20 mg total) by mouth daily.  Dispense: 90 tablet; Refill: 1  3. Hypertension associated with diabetes ([place]) Uncontrolled but improved from previous labs Increased dose of amlodipine Counseled on blood pressure goal of less than 130/80, low-sodium, DASH diet, medication compliance, 150 minutes of moderate intensity exercise per week. Discussed medication compliance, adverse effects. - amLODipine (NORVASC) 10 MG tablet; Take 1 tablet (10  mg total) by mouth daily.  Dispense: 90 tablet; Refill: 1  4. Screening for colon cancer - Cologuard   Health Care Maintenance: Declines flu shot and pneumonia vaccine Meds ordered this encounter  Medications   glipiZIDE (GLUCOTROL) 10 MG tablet    Sig: Take 1 tablet (10 mg total) by mouth 2 (two) times daily before a meal.    Dispense:  180 tablet    Refill:  1   atorvastatin (LIPITOR) 20 MG tablet    Sig: Take 1 tablet (20 mg total) by mouth daily.    Dispense:  90 tablet    Refill:  1   amLODipine (NORVASC) 10 MG tablet    Sig: Take 1 tablet (10 mg total) by mouth daily.    Dispense:  90 tablet    Refill:  1    Dose increase     Follow-up: Return in about 3 months (around 07/09/2021) for Chronic medical conditions.       Charlott Rakes, MD, FAAFP. [facility] and [place], [place]   04/08/2021, 12:15 PM

## 2021-04-08 NOTE — Patient Instructions (Signed)

## 2021-05-09 ENCOUNTER — Other Ambulatory Visit: Payer: Self-pay

## 2021-05-22 ENCOUNTER — Other Ambulatory Visit: Payer: Self-pay

## 2021-05-25 DIAGNOSIS — I69391 Dysphagia following cerebral infarction: Secondary | ICD-10-CM | POA: Insufficient documentation

## 2021-06-25 ENCOUNTER — Other Ambulatory Visit: Payer: Self-pay | Admitting: Family Medicine

## 2021-06-25 ENCOUNTER — Other Ambulatory Visit: Payer: Self-pay

## 2021-06-25 MED ORDER — LISINOPRIL 10 MG PO TABS
10.0000 mg | ORAL_TABLET | Freq: Every day | ORAL | 0 refills | Status: DC
  Filled 2021-06-25 (×2): qty 30, 30d supply, fill #0

## 2021-06-25 NOTE — Telephone Encounter (Signed)
Requested Prescriptions  Pending Prescriptions Disp Refills   lisinopril (ZESTRIL) 10 MG tablet 30 tablet 0    Sig: Take 1 tablet (10 mg total) by mouth daily.     Cardiovascular:  ACE Inhibitors Failed - 06/25/2021  6:02 AM      Failed - Last BP in normal range    BP Readings from Last 1 Encounters:  04/08/21 (!) 155/102         Passed - Cr in normal range and within 180 days    Creat  Date Value Ref Range Status  11/19/2011 1.05 0.50 - 1.35 mg/dL Final   Creatinine, Ser  Date Value Ref Range Status  03/20/2021 0.97 0.76 - 1.27 mg/dL Final         Passed - K in normal range and within 180 days    Potassium  Date Value Ref Range Status  03/20/2021 3.9 3.5 - 5.2 mmol/L Final         Passed - Patient is not pregnant      Passed - Valid encounter within last 6 months    Recent Outpatient Visits          2 months ago Type 2 diabetes mellitus with diabetic polyneuropathy, without long-term current use of insulin ([place])   [place], Charlane Ferretti, MD   3 months ago Accelerated hypertension   [place], Villalba, MD   5 months ago Type 2 diabetes mellitus with diabetic polyneuropathy, without long-term current use of insulin ([place])   [place], Charlane Ferretti, MD   6 months ago Diabetes mellitus without complication [facility])   [place], Jarome Matin, RPH-CPP   2 years ago Contact with and (suspected) exposure to [place], Enobong, MD      Future Appointments            In 2 weeks Charlott Rakes, MD [place]

## 2021-07-07 ENCOUNTER — Other Ambulatory Visit: Payer: Self-pay

## 2021-07-09 ENCOUNTER — Ambulatory Visit: Payer: BC Managed Care – PPO | Admitting: Family Medicine

## 2021-09-10 ENCOUNTER — Other Ambulatory Visit: Payer: Self-pay

## 2021-09-10 ENCOUNTER — Other Ambulatory Visit: Payer: Self-pay | Admitting: Family Medicine

## 2021-09-10 MED ORDER — LISINOPRIL 10 MG PO TABS
10.0000 mg | ORAL_TABLET | Freq: Every day | ORAL | 0 refills | Status: DC
  Filled 2021-09-10: qty 30, 30d supply, fill #0

## 2021-09-12 ENCOUNTER — Encounter (HOSPITAL_COMMUNITY)
Admission: EM | Disposition: A | Discharge status: Nursing Home (Includes ICF) | Payer: Self-pay | Source: Home / Self Care | Attending: Neurology

## 2021-09-12 ENCOUNTER — Inpatient Hospital Stay (HOSPITAL_COMMUNITY)
Admission: EM | Admit: 2021-09-12 | Discharge: 2021-10-08 | DRG: 003 | Disposition: A | Discharge status: Other Acute Inpatient Hospital | Payer: BC Managed Care – PPO | Attending: Neurology | Admitting: Neurology

## 2021-09-12 ENCOUNTER — Inpatient Hospital Stay (HOSPITAL_COMMUNITY): Payer: BC Managed Care – PPO

## 2021-09-12 ENCOUNTER — Inpatient Hospital Stay (HOSPITAL_COMMUNITY): Payer: BC Managed Care – PPO | Admitting: Registered Nurse

## 2021-09-12 ENCOUNTER — Other Ambulatory Visit: Payer: Self-pay

## 2021-09-12 ENCOUNTER — Emergency Department (HOSPITAL_COMMUNITY): Payer: BC Managed Care – PPO

## 2021-09-12 ENCOUNTER — Encounter (HOSPITAL_COMMUNITY): Payer: Self-pay

## 2021-09-12 DIAGNOSIS — I10 Essential (primary) hypertension: Secondary | ICD-10-CM | POA: Diagnosis not present

## 2021-09-12 DIAGNOSIS — R339 Retention of urine, unspecified: Secondary | ICD-10-CM | POA: Diagnosis not present

## 2021-09-12 DIAGNOSIS — E870 Hyperosmolality and hypernatremia: Secondary | ICD-10-CM | POA: Diagnosis not present

## 2021-09-12 DIAGNOSIS — I69320 Aphasia following cerebral infarction: Secondary | ICD-10-CM | POA: Diagnosis not present

## 2021-09-12 DIAGNOSIS — N179 Acute kidney failure, unspecified: Secondary | ICD-10-CM | POA: Diagnosis present

## 2021-09-12 DIAGNOSIS — A4102 Sepsis due to Methicillin resistant Staphylococcus aureus: Secondary | ICD-10-CM | POA: Diagnosis not present

## 2021-09-12 DIAGNOSIS — J15212 Pneumonia due to Methicillin resistant Staphylococcus aureus: Secondary | ICD-10-CM | POA: Diagnosis not present

## 2021-09-12 DIAGNOSIS — R29702 NIHSS score 2: Secondary | ICD-10-CM | POA: Diagnosis present

## 2021-09-12 DIAGNOSIS — G44309 Post-traumatic headache, unspecified, not intractable: Secondary | ICD-10-CM | POA: Diagnosis not present

## 2021-09-12 DIAGNOSIS — I081 Rheumatic disorders of both mitral and tricuspid valves: Secondary | ICD-10-CM | POA: Diagnosis not present

## 2021-09-12 DIAGNOSIS — I82402 Acute embolism and thrombosis of unspecified deep veins of left lower extremity: Secondary | ICD-10-CM | POA: Diagnosis not present

## 2021-09-12 DIAGNOSIS — I6389 Other cerebral infarction: Secondary | ICD-10-CM | POA: Diagnosis not present

## 2021-09-12 DIAGNOSIS — I63311 Cerebral infarction due to thrombosis of right middle cerebral artery: Secondary | ICD-10-CM | POA: Diagnosis not present

## 2021-09-12 DIAGNOSIS — R739 Hyperglycemia, unspecified: Secondary | ICD-10-CM

## 2021-09-12 DIAGNOSIS — K567 Ileus, unspecified: Secondary | ICD-10-CM | POA: Diagnosis not present

## 2021-09-12 DIAGNOSIS — J189 Pneumonia, unspecified organism: Secondary | ICD-10-CM | POA: Diagnosis not present

## 2021-09-12 DIAGNOSIS — R29810 Facial weakness: Secondary | ICD-10-CM | POA: Diagnosis not present

## 2021-09-12 DIAGNOSIS — R29713 NIHSS score 13: Secondary | ICD-10-CM | POA: Diagnosis not present

## 2021-09-12 DIAGNOSIS — R1310 Dysphagia, unspecified: Secondary | ICD-10-CM | POA: Diagnosis present

## 2021-09-12 DIAGNOSIS — E11649 Type 2 diabetes mellitus with hypoglycemia without coma: Secondary | ICD-10-CM | POA: Diagnosis not present

## 2021-09-12 DIAGNOSIS — I82451 Acute embolism and thrombosis of right peroneal vein: Secondary | ICD-10-CM | POA: Diagnosis not present

## 2021-09-12 DIAGNOSIS — R609 Edema, unspecified: Secondary | ICD-10-CM | POA: Diagnosis not present

## 2021-09-12 DIAGNOSIS — G936 Cerebral edema: Secondary | ICD-10-CM | POA: Diagnosis not present

## 2021-09-12 DIAGNOSIS — R531 Weakness: Secondary | ICD-10-CM | POA: Diagnosis present

## 2021-09-12 DIAGNOSIS — Z7984 Long term (current) use of oral hypoglycemic drugs: Secondary | ICD-10-CM

## 2021-09-12 DIAGNOSIS — G8194 Hemiplegia, unspecified affecting left nondominant side: Secondary | ICD-10-CM | POA: Diagnosis not present

## 2021-09-12 DIAGNOSIS — R509 Fever, unspecified: Secondary | ICD-10-CM

## 2021-09-12 DIAGNOSIS — E669 Obesity, unspecified: Secondary | ICD-10-CM | POA: Diagnosis present

## 2021-09-12 DIAGNOSIS — Z930 Tracheostomy status: Secondary | ICD-10-CM | POA: Diagnosis not present

## 2021-09-12 DIAGNOSIS — E876 Hypokalemia: Secondary | ICD-10-CM | POA: Diagnosis not present

## 2021-09-12 DIAGNOSIS — J90 Pleural effusion, not elsewhere classified: Secondary | ICD-10-CM | POA: Diagnosis not present

## 2021-09-12 DIAGNOSIS — Z4682 Encounter for fitting and adjustment of non-vascular catheter: Secondary | ICD-10-CM | POA: Diagnosis not present

## 2021-09-12 DIAGNOSIS — L89611 Pressure ulcer of right heel, stage 1: Secondary | ICD-10-CM | POA: Diagnosis not present

## 2021-09-12 DIAGNOSIS — E1152 Type 2 diabetes mellitus with diabetic peripheral angiopathy with gangrene: Secondary | ICD-10-CM | POA: Diagnosis not present

## 2021-09-12 DIAGNOSIS — D6959 Other secondary thrombocytopenia: Secondary | ICD-10-CM | POA: Diagnosis not present

## 2021-09-12 DIAGNOSIS — E1165 Type 2 diabetes mellitus with hyperglycemia: Secondary | ICD-10-CM | POA: Diagnosis not present

## 2021-09-12 DIAGNOSIS — G9340 Encephalopathy, unspecified: Secondary | ICD-10-CM | POA: Diagnosis not present

## 2021-09-12 DIAGNOSIS — K6389 Other specified diseases of intestine: Secondary | ICD-10-CM | POA: Diagnosis not present

## 2021-09-12 DIAGNOSIS — I63511 Cerebral infarction due to unspecified occlusion or stenosis of right middle cerebral artery: Secondary | ICD-10-CM | POA: Diagnosis not present

## 2021-09-12 DIAGNOSIS — D72829 Elevated white blood cell count, unspecified: Secondary | ICD-10-CM | POA: Diagnosis not present

## 2021-09-12 DIAGNOSIS — R4182 Altered mental status, unspecified: Secondary | ICD-10-CM | POA: Diagnosis not present

## 2021-09-12 DIAGNOSIS — I82612 Acute embolism and thrombosis of superficial veins of left upper extremity: Secondary | ICD-10-CM | POA: Diagnosis present

## 2021-09-12 DIAGNOSIS — J9601 Acute respiratory failure with hypoxia: Secondary | ICD-10-CM | POA: Diagnosis not present

## 2021-09-12 DIAGNOSIS — J9811 Atelectasis: Secondary | ICD-10-CM | POA: Diagnosis not present

## 2021-09-12 DIAGNOSIS — G935 Compression of brain: Secondary | ICD-10-CM | POA: Diagnosis not present

## 2021-09-12 DIAGNOSIS — Y95 Nosocomial condition: Secondary | ICD-10-CM | POA: Diagnosis not present

## 2021-09-12 DIAGNOSIS — E7800 Pure hypercholesterolemia, unspecified: Secondary | ICD-10-CM | POA: Diagnosis not present

## 2021-09-12 DIAGNOSIS — I639 Cerebral infarction, unspecified: Secondary | ICD-10-CM | POA: Diagnosis not present

## 2021-09-12 DIAGNOSIS — Z430 Encounter for attention to tracheostomy: Secondary | ICD-10-CM | POA: Diagnosis not present

## 2021-09-12 DIAGNOSIS — E119 Type 2 diabetes mellitus without complications: Secondary | ICD-10-CM

## 2021-09-12 DIAGNOSIS — I161 Hypertensive emergency: Secondary | ICD-10-CM | POA: Diagnosis not present

## 2021-09-12 DIAGNOSIS — E878 Other disorders of electrolyte and fluid balance, not elsewhere classified: Secondary | ICD-10-CM | POA: Diagnosis not present

## 2021-09-12 DIAGNOSIS — I63519 Cerebral infarction due to unspecified occlusion or stenosis of unspecified middle cerebral artery: Secondary | ICD-10-CM | POA: Diagnosis present

## 2021-09-12 DIAGNOSIS — L03311 Cellulitis of abdominal wall: Secondary | ICD-10-CM | POA: Diagnosis not present

## 2021-09-12 DIAGNOSIS — Z452 Encounter for adjustment and management of vascular access device: Secondary | ICD-10-CM | POA: Diagnosis not present

## 2021-09-12 DIAGNOSIS — G460 Middle cerebral artery syndrome: Secondary | ICD-10-CM | POA: Diagnosis present

## 2021-09-12 DIAGNOSIS — E1159 Type 2 diabetes mellitus with other circulatory complications: Secondary | ICD-10-CM

## 2021-09-12 DIAGNOSIS — I152 Hypertension secondary to endocrine disorders: Secondary | ICD-10-CM

## 2021-09-12 DIAGNOSIS — I69391 Dysphagia following cerebral infarction: Secondary | ICD-10-CM | POA: Diagnosis not present

## 2021-09-12 DIAGNOSIS — D649 Anemia, unspecified: Secondary | ICD-10-CM | POA: Diagnosis not present

## 2021-09-12 DIAGNOSIS — I63411 Cerebral infarction due to embolism of right middle cerebral artery: Secondary | ICD-10-CM | POA: Diagnosis not present

## 2021-09-12 DIAGNOSIS — K9422 Gastrostomy infection: Secondary | ICD-10-CM | POA: Diagnosis not present

## 2021-09-12 DIAGNOSIS — Z9049 Acquired absence of other specified parts of digestive tract: Secondary | ICD-10-CM

## 2021-09-12 DIAGNOSIS — E1169 Type 2 diabetes mellitus with other specified complication: Secondary | ICD-10-CM

## 2021-09-12 DIAGNOSIS — G9349 Other encephalopathy: Secondary | ICD-10-CM | POA: Diagnosis not present

## 2021-09-12 DIAGNOSIS — K3189 Other diseases of stomach and duodenum: Secondary | ICD-10-CM | POA: Diagnosis not present

## 2021-09-12 DIAGNOSIS — I69398 Other sequelae of cerebral infarction: Secondary | ICD-10-CM | POA: Diagnosis not present

## 2021-09-12 DIAGNOSIS — J9621 Acute and chronic respiratory failure with hypoxia: Secondary | ICD-10-CM | POA: Diagnosis not present

## 2021-09-12 DIAGNOSIS — I96 Gangrene, not elsewhere classified: Secondary | ICD-10-CM | POA: Diagnosis not present

## 2021-09-12 DIAGNOSIS — R1312 Dysphagia, oropharyngeal phase: Secondary | ICD-10-CM | POA: Diagnosis not present

## 2021-09-12 DIAGNOSIS — Z8673 Personal history of transient ischemic attack (TIA), and cerebral infarction without residual deficits: Secondary | ICD-10-CM | POA: Diagnosis not present

## 2021-09-12 DIAGNOSIS — I679 Cerebrovascular disease, unspecified: Secondary | ICD-10-CM

## 2021-09-12 DIAGNOSIS — R918 Other nonspecific abnormal finding of lung field: Secondary | ICD-10-CM | POA: Diagnosis not present

## 2021-09-12 DIAGNOSIS — Z6836 Body mass index (BMI) 36.0-36.9, adult: Secondary | ICD-10-CM

## 2021-09-12 DIAGNOSIS — J9690 Respiratory failure, unspecified, unspecified whether with hypoxia or hypercapnia: Secondary | ICD-10-CM | POA: Diagnosis not present

## 2021-09-12 DIAGNOSIS — G43611 Persistent migraine aura with cerebral infarction, intractable, with status migrainosus: Secondary | ICD-10-CM | POA: Diagnosis not present

## 2021-09-12 DIAGNOSIS — J9622 Acute and chronic respiratory failure with hypercapnia: Secondary | ICD-10-CM | POA: Diagnosis not present

## 2021-09-12 DIAGNOSIS — I63542 Cerebral infarction due to unspecified occlusion or stenosis of left cerebellar artery: Secondary | ICD-10-CM | POA: Diagnosis not present

## 2021-09-12 DIAGNOSIS — I6603 Occlusion and stenosis of bilateral middle cerebral arteries: Secondary | ICD-10-CM | POA: Diagnosis not present

## 2021-09-12 DIAGNOSIS — Z79899 Other long term (current) drug therapy: Secondary | ICD-10-CM

## 2021-09-12 DIAGNOSIS — E785 Hyperlipidemia, unspecified: Secondary | ICD-10-CM | POA: Diagnosis present

## 2021-09-12 DIAGNOSIS — I38 Endocarditis, valve unspecified: Secondary | ICD-10-CM | POA: Diagnosis not present

## 2021-09-12 DIAGNOSIS — R6520 Severe sepsis without septic shock: Secondary | ICD-10-CM | POA: Diagnosis not present

## 2021-09-12 DIAGNOSIS — I6340 Cerebral infarction due to embolism of unspecified cerebral artery: Secondary | ICD-10-CM | POA: Insufficient documentation

## 2021-09-12 DIAGNOSIS — A419 Sepsis, unspecified organism: Secondary | ICD-10-CM | POA: Diagnosis not present

## 2021-09-12 DIAGNOSIS — R140 Abdominal distension (gaseous): Secondary | ICD-10-CM | POA: Diagnosis not present

## 2021-09-12 DIAGNOSIS — I69354 Hemiplegia and hemiparesis following cerebral infarction affecting left non-dominant side: Secondary | ICD-10-CM | POA: Diagnosis not present

## 2021-09-12 DIAGNOSIS — Z825 Family history of asthma and other chronic lower respiratory diseases: Secondary | ICD-10-CM

## 2021-09-12 DIAGNOSIS — R471 Dysarthria and anarthria: Secondary | ICD-10-CM | POA: Diagnosis present

## 2021-09-12 DIAGNOSIS — Z9911 Dependence on respirator [ventilator] status: Secondary | ICD-10-CM | POA: Diagnosis not present

## 2021-09-12 DIAGNOSIS — R21 Rash and other nonspecific skin eruption: Secondary | ICD-10-CM | POA: Diagnosis not present

## 2021-09-12 DIAGNOSIS — Z9889 Other specified postprocedural states: Secondary | ICD-10-CM | POA: Diagnosis not present

## 2021-09-12 HISTORY — PX: IR US GUIDE VASC ACCESS RIGHT: IMG2390

## 2021-09-12 HISTORY — PX: IR PERCUTANEOUS ART THROMBECTOMY/INFUSION INTRACRANIAL INC DIAG ANGIO: IMG6087

## 2021-09-12 HISTORY — PX: RADIOLOGY WITH ANESTHESIA: SHX6223

## 2021-09-12 HISTORY — PX: IR CT HEAD LTD: IMG2386

## 2021-09-12 LAB — PROTIME-INR
INR: 1 (ref 0.8–1.2)
Prothrombin Time: 12.8 seconds (ref 11.4–15.2)

## 2021-09-12 LAB — I-STAT CHEM 8, ED
BUN: 13 mg/dL (ref 6–20)
Calcium, Ion: 1.2 mmol/L (ref 1.15–1.40)
Chloride: 101 mmol/L (ref 98–111)
Creatinine, Ser: 1 mg/dL (ref 0.61–1.24)
Glucose, Bld: 308 mg/dL — ABNORMAL HIGH (ref 70–99)
HCT: 40 % (ref 39.0–52.0)
Hemoglobin: 13.6 g/dL (ref 13.0–17.0)
Potassium: 3.6 mmol/L (ref 3.5–5.1)
Sodium: 139 mmol/L (ref 135–145)
TCO2: 30 mmol/L (ref 22–32)

## 2021-09-12 LAB — COMPREHENSIVE METABOLIC PANEL
ALT: 16 U/L (ref 0–44)
AST: 24 U/L (ref 15–41)
Albumin: 3.5 g/dL (ref 3.5–5.0)
Alkaline Phosphatase: 98 U/L (ref 38–126)
Anion gap: 8 (ref 5–15)
BUN: 11 mg/dL (ref 6–20)
CO2: 26 mmol/L (ref 22–32)
Calcium: 9 mg/dL (ref 8.9–10.3)
Chloride: 103 mmol/L (ref 98–111)
Creatinine, Ser: 1.04 mg/dL (ref 0.61–1.24)
GFR, Estimated: >60 mL/min (ref >60)
Glucose, Bld: 308 mg/dL — ABNORMAL HIGH (ref 70–99)
Potassium: 3.5 mmol/L (ref 3.5–5.1)
Sodium: 137 mmol/L (ref 135–145)
Total Bilirubin: 0.8 mg/dL (ref 0.3–1.2)
Total Protein: 6.8 g/dL (ref 6.5–8.1)

## 2021-09-12 LAB — GLUCOSE, CAPILLARY
Glucose-Capillary: 312 mg/dL — ABNORMAL HIGH (ref 70–99)
Glucose-Capillary: 264 mg/dL — ABNORMAL HIGH (ref 70–99)
Glucose-Capillary: 243 mg/dL — ABNORMAL HIGH (ref 70–99)
Glucose-Capillary: 298 mg/dL — ABNORMAL HIGH (ref 70–99)

## 2021-09-12 LAB — ECHOCARDIOGRAM COMPLETE
AR max vel: 3.47 cm2
AV Peak grad: 15.7 mmHg
Ao pk vel: 1.98 m/s
Area-P 1/2: 4.33 cm2
Calc EF: 65.1 %
Height: 73 in
S' Lateral: 3 cm
Single Plane A2C EF: 67.6 %
Single Plane A4C EF: 63.6 %
Weight: 4373.93 oz

## 2021-09-12 LAB — LIPID PANEL
Cholesterol: 191 mg/dL (ref 0–200)
HDL: 38 mg/dL — ABNORMAL LOW (ref >40)
LDL Cholesterol: 96 mg/dL (ref 0–99)
Total CHOL/HDL Ratio: 5 RATIO
Triglycerides: 284 mg/dL — ABNORMAL HIGH (ref <150)
VLDL: 57 mg/dL — ABNORMAL HIGH (ref 0–40)

## 2021-09-12 LAB — DIFFERENTIAL
Abs Immature Granulocytes: 0.02 10*3/uL (ref 0.00–0.07)
Basophils Absolute: 0 10*3/uL (ref 0.0–0.1)
Basophils Relative: 1 %
Eosinophils Absolute: 0.2 10*3/uL (ref 0.0–0.5)
Eosinophils Relative: 4 %
Immature Granulocytes: 0 %
Lymphocytes Relative: 47 %
Lymphs Abs: 2.6 10*3/uL (ref 0.7–4.0)
Monocytes Absolute: 0.5 10*3/uL (ref 0.1–1.0)
Monocytes Relative: 9 %
Neutro Abs: 2.1 10*3/uL (ref 1.7–7.7)
Neutrophils Relative %: 39 %

## 2021-09-12 LAB — CBC
HCT: 40 % (ref 39.0–52.0)
Hemoglobin: 14.3 g/dL (ref 13.0–17.0)
MCH: 29.7 pg (ref 26.0–34.0)
MCHC: 35.8 g/dL (ref 30.0–36.0)
MCV: 83 fL (ref 80.0–100.0)
Platelets: 174 10*3/uL (ref 150–400)
RBC: 4.82 MIL/uL (ref 4.22–5.81)
RDW: 12.4 % (ref 11.5–15.5)
WBC: 5.5 10*3/uL (ref 4.0–10.5)
nRBC: 0 % (ref 0.0–0.2)

## 2021-09-12 LAB — APTT: aPTT: 27 seconds (ref 24–36)

## 2021-09-12 LAB — CBG MONITORING, ED
Glucose-Capillary: 291 mg/dL — ABNORMAL HIGH (ref 70–99)
Glucose-Capillary: 285 mg/dL — ABNORMAL HIGH (ref 70–99)

## 2021-09-12 LAB — RAPID URINE DRUG SCREEN, HOSP PERFORMED
Amphetamines: NOT DETECTED
Barbiturates: NOT DETECTED
Benzodiazepines: NOT DETECTED
Cocaine: NOT DETECTED
Opiates: NOT DETECTED
Tetrahydrocannabinol: NOT DETECTED

## 2021-09-12 LAB — MRSA NEXT GEN BY PCR, NASAL: MRSA by PCR Next Gen: NOT DETECTED

## 2021-09-12 LAB — HEMOGLOBIN A1C
Hgb A1c MFr Bld: 8.1 % — ABNORMAL HIGH (ref 4.8–5.6)
Mean Plasma Glucose: 185.77 mg/dL

## 2021-09-12 IMAGING — XA IR PERCUTANEOUS ART THORMBECTOMY/INFUSION INTRACRANIAL INCLUDE D
7 series · 12 of 24 positions shown · IV contrast (IODINE)
Comparison: CT/CT angiogram of the head and neck [DATE].

INDICATION: 46-year-old male with past medical history significant for
hypertension and diabetes mellitus, baseline modified CITAKU
0. He presented to emergency with left-sided facial droop, NIHSS 2.
Last known well [DATE] a.m. on [DATE]. Head CT showed no evidence
of large territory infarct or hemorrhage and CITAKU was administered at
[DATE] a.m. CT angiogram of the head and neck showed a short segment
right M2/MCA occlusion. Given low NIHSS, we were not connected for
thrombectomy. However, patient showed significant decline with
left-sided weakness and neglect, NIHSS 13. At this point, decision
was made to proceed with mechanical thrombectomy.

EXAM:
ULTRASOUND-GUIDED VASCULAR [REDACTED] CEREBRAL ANGIOGRAM
MECHANICAL THROMBECTOMY
FLAT PANEL HEAD CT
TECHNIQUE: Informed written consent was obtained from the patient's wife after
a thorough discussion of the procedural risks, benefits and
alternatives. All questions were addressed. Maximal Sterile Barrier
Technique was utilized including caps, mask, sterile gowns, sterile
gloves, sterile drape, hand hygiene and skin antiseptic. A timeout
was performed prior to the initiation of the procedure.

[Series 1: cerebral care 2 · 2 acquisitions, 1 frame shown (1 of 4)]
[im 1/2]
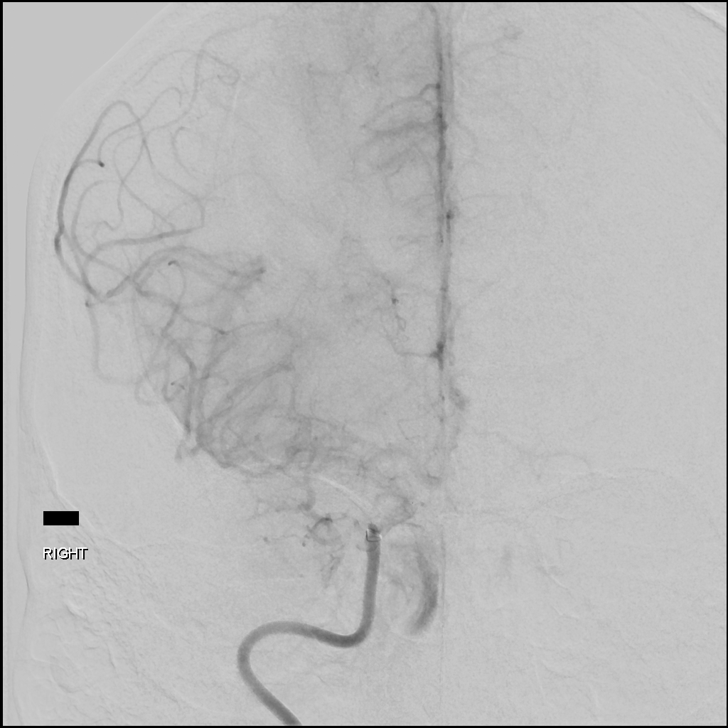

[Series 2: n roadmap · 2 acquisitions, 2 frames shown]
[im 1/2]
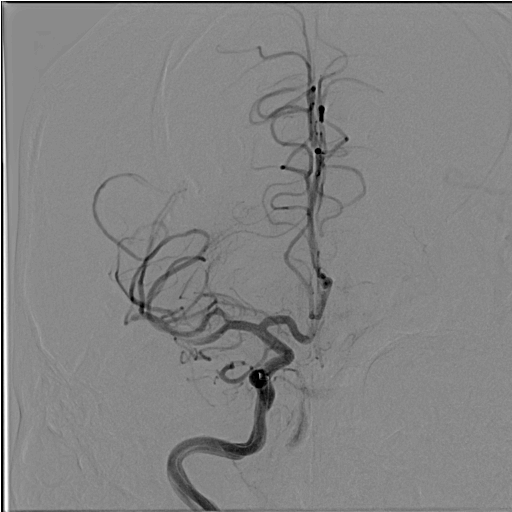
[im 1/2]
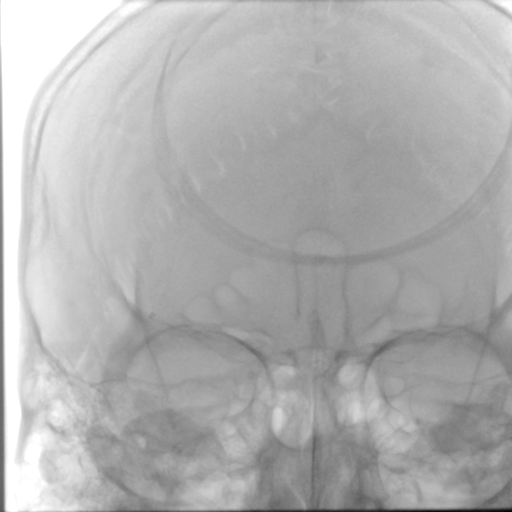

[Series 4: cerebral care 2 · 2 acquisitions, 1 frame shown (2 of 4)]
[im 1/2]
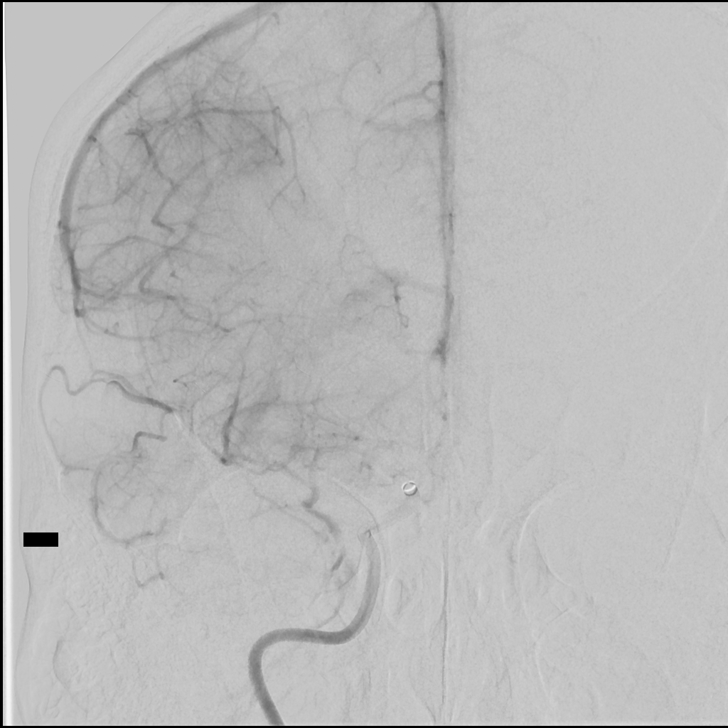

[Series 5: cerebral care 2 · 2 acquisitions, 1 frame shown (3 of 4)]
[im 1/2]
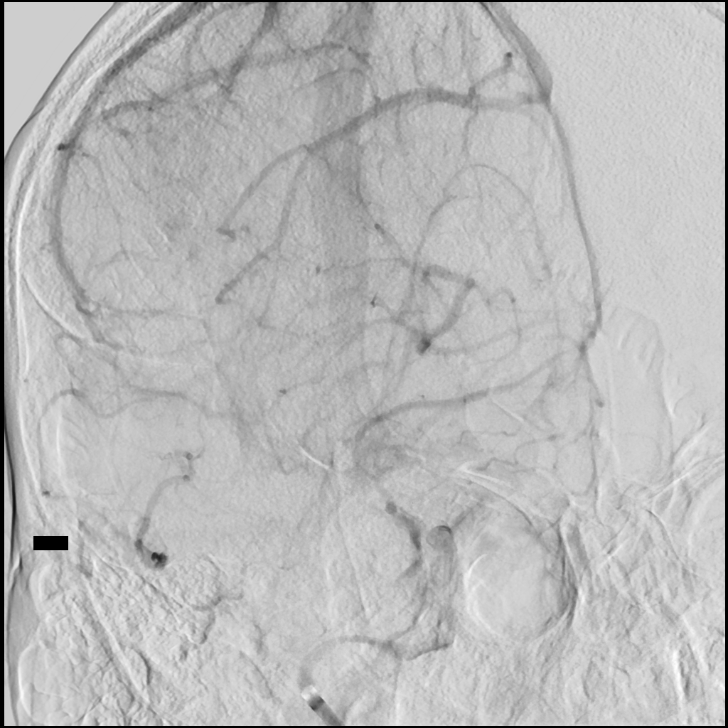

[Series 6: 1 · axial · 5.0mm · 0.42mm/px · z∈[-85,+51]mm · 5 of 34 slices shown]
[im 3/34]
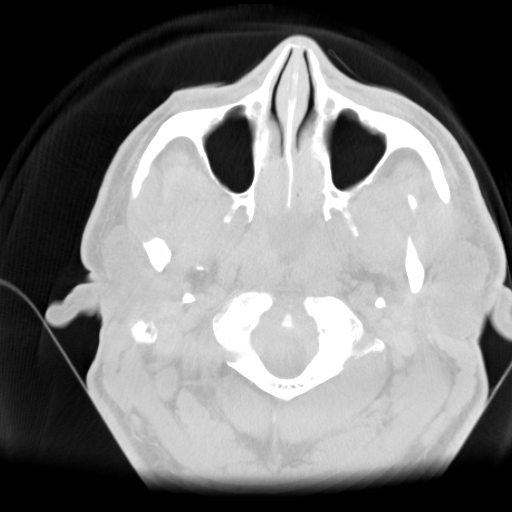
[im 10/34]
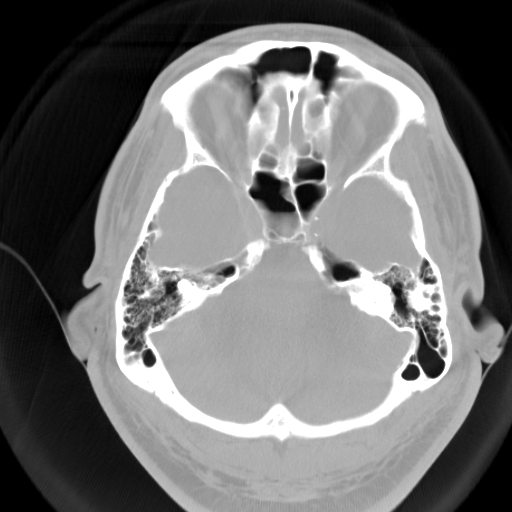
[im 19/34]
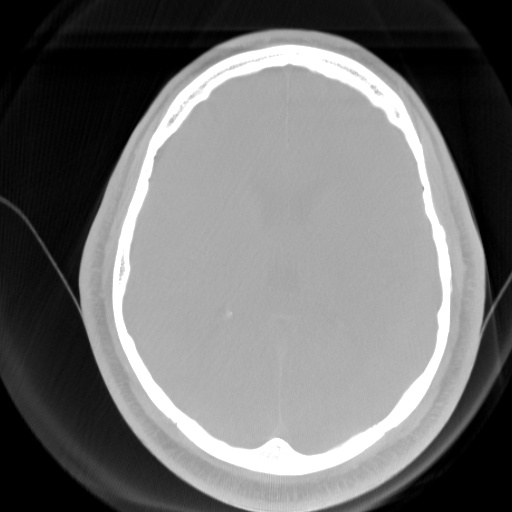
[im 26/34]
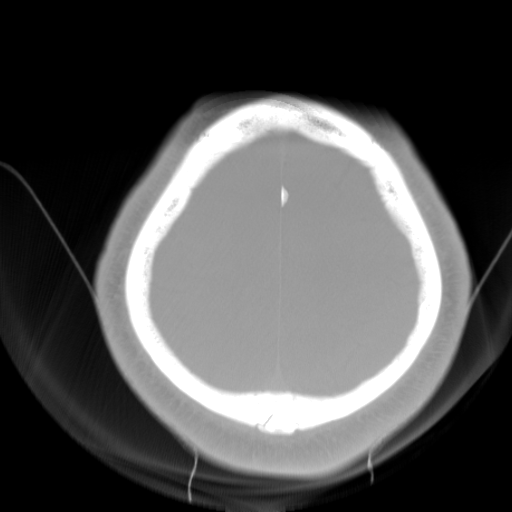
[im 34/34  full-range]
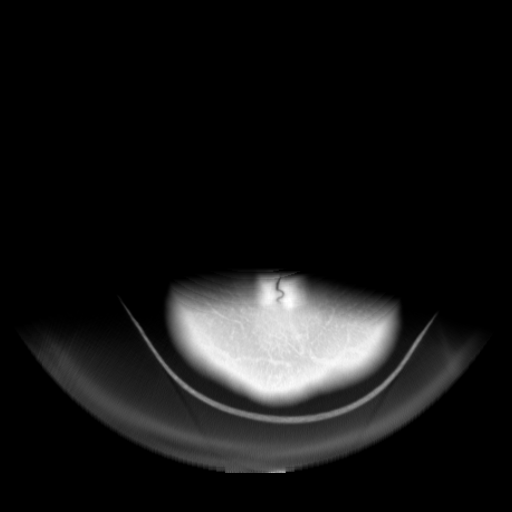

[Series 7: cerebral care 2 · 1 of 9 frames shown (4 of 4)]
[frame 8/9]
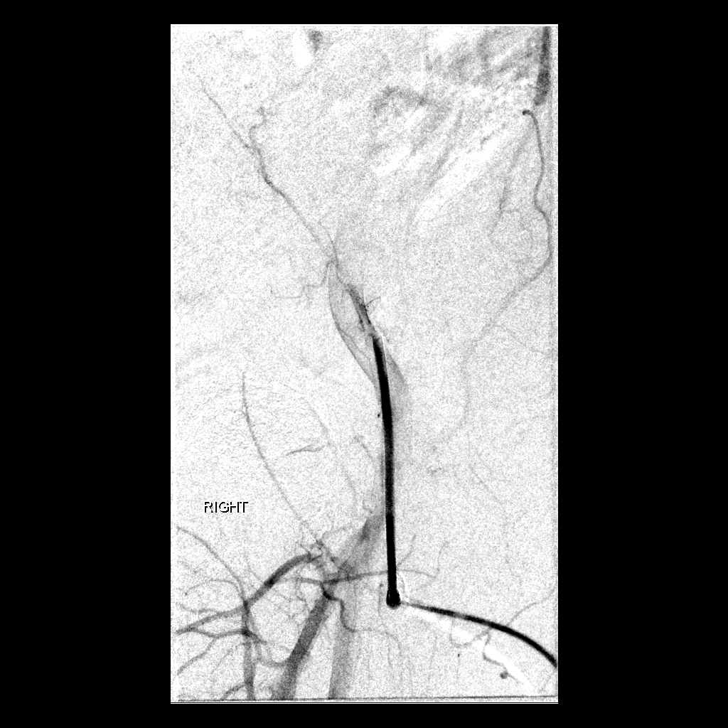

[Series 300: dr. (person_name). · 1 of 8 slices shown]
[im 8/8]
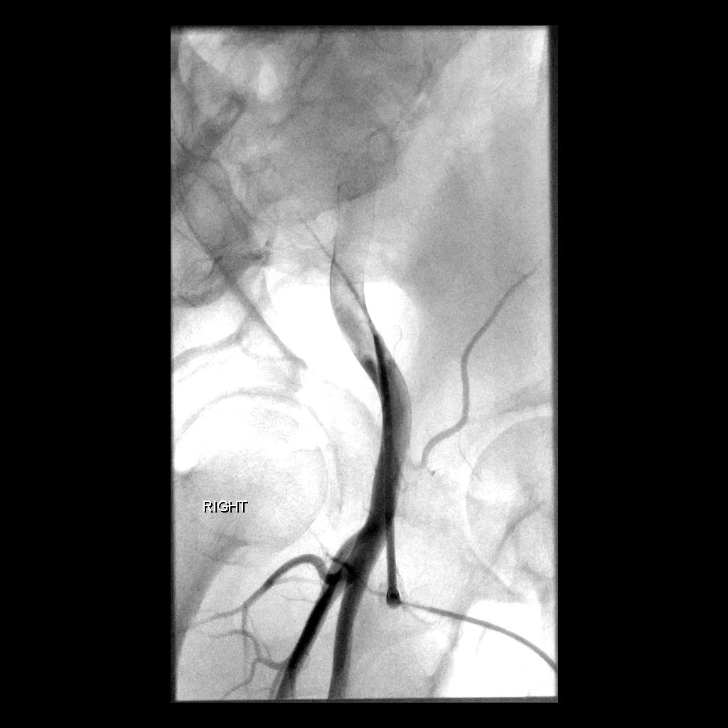

[12 of 24 positions shown; findings below may reference images not displayed]

MEDICATIONS:
No antibiotics administered.

ANESTHESIA/SEDATION:
The procedure was performed under general anesthesia.

CONTRAST:  Thirty mL of Omnipaque 300 mg/mL.

FLUOROSCOPY:
Radiation Exposure Index (as provided by the fluoroscopic device):
478 mGy Kerma

COMPLICATIONS:
None immediate.
The right groin was prepped and draped in the usual sterile fashion.
Using a micropuncture kit and the modified Seldinger technique,
access was gained to the right common femoral artery and an 8 French
sheath was placed. Real-time ultrasound guidance was utilized for
vascular access including the acquisition of a permanent ultrasound
image documenting patency of the accessed vessel.

Under fluoroscopy, a Zoom 88 guide catheter was navigated over a 6
CITAKU 2 catheter and a 0.035" Terumo Glidewire into the
aortic arch. The catheter was placed into the right common carotid
artery and then advanced into the right internal carotid artery. The
diagnostic catheter was removed. Frontal and lateral angiograms of
the head were obtained.
FINDINGS: 1. The right common femoral artery has normal caliber, adequate for
vascular access.
2. Filling defect within the mid right M2/MCA inferior division
branch with near occlusion and delayed filling of posterior division
branches, particularly in the posterior frontal/anterior parietal
area.

PROCEDURE:
Using biplane roadmap, a Zoom 55 aspiration catheter was navigated
over an Aristotle 24 microguidewire into the cavernous segment of
the right ICA. Attempts to navigate the aspiration catheter over the
wire into the posterior division branch proved unsuccessful. The
Aristotle 24 microwire was removed. Then, triaxial navigation of a
phenom 21 microcatheter through the aspiration catheter and over an
Aristotle 14 micro guidewire was performed into the M2/MCA posterior
division branch. The aspiration catheter was then advanced to the
level of occlusion over the microcatheter. The microcatheter and
microwire were removed and the aspiration catheter was connected to
an aspiration pump. Continuous aspiration was performed for 2
minutes. The guide catheter was connected to a VacLok syringe. The
aspiration catheter was subsequently removed under constant
aspiration. The guide catheter was aspirated for debris.

Right internal carotid artery angiograms in frontal, lateral common
magnified right anterior oblique and magnified lateral views of the
head were obtained. Complete recanalization was achieved ([TH]).
Multifocal areas of severe stenosis in the right MCA vascular tree
were noted, particularly in the posterior division branches.

Flat panel CT of the head was obtained and post processed in a
separate workstation with concurrent attending physician
supervision. Selected images were sent to PACS. No evidence of
hemorrhagic complication.

Right common femoral artery angiogram was obtained in right anterior
oblique view. The puncture is at the level of the common femoral
artery. The artery has normal caliber, adequate for closure device.
The sheath was exchanged over the wire for a Perclose prostyle which
was utilized for access closure. Immediate hemostasis was achieved.
IMPRESSION: 1. Successful mechanical thrombectomy with direct contact aspiration
for treatment of a near occlusive mid right M2/MCA posterior
division branch (TICI 3).
2. No evidence of hemorrhagic of thromboembolic complication.
3. Intracranial atherosclerotic disease severe stenosis of M3
branches in the posterior division.

PLAN:
1. Bed rest x6 hours.
2. SBP 120-140 mmHg.
3. CITAKU precautions per Neurology.

## 2021-09-12 IMAGING — CT CT HEAD W/O CM
4 series · 17 of 47 positions shown, 19 images · non-contrast
Comparison: Head CT and CTA from earlier today

CLINICAL DATA: Stroke follow-up.  Left arm and leg drift.



[Series 3: head without · axial · non-contrast · 0.45mm/px · z∈[-89,+31]mm · 7 of 34 slices shown, 9 images]
[im 5/34  brain]
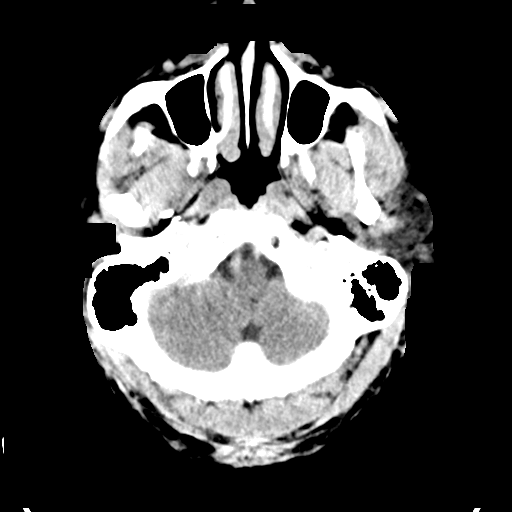
[im 5/34  bone]
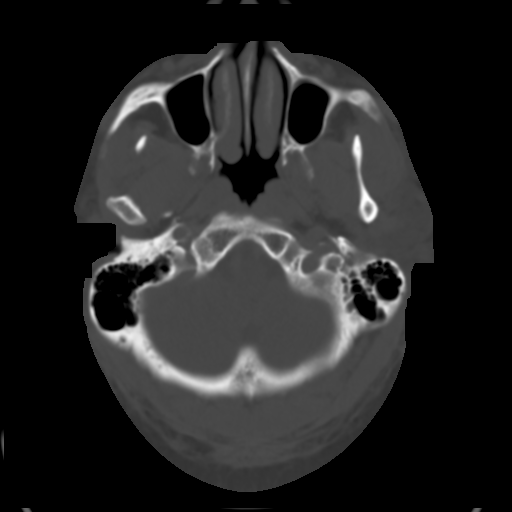
[im 9/34  brain]
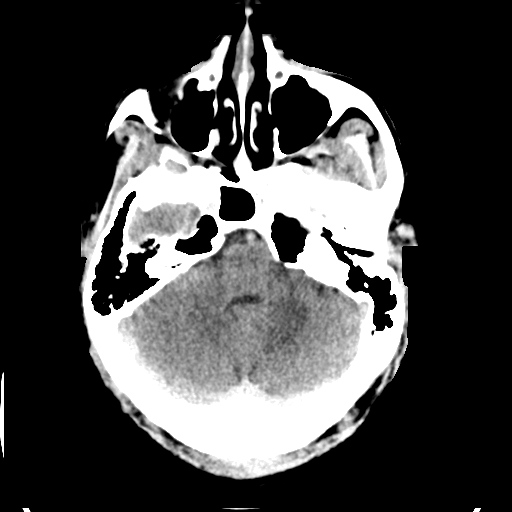
[im 13/34  brain]
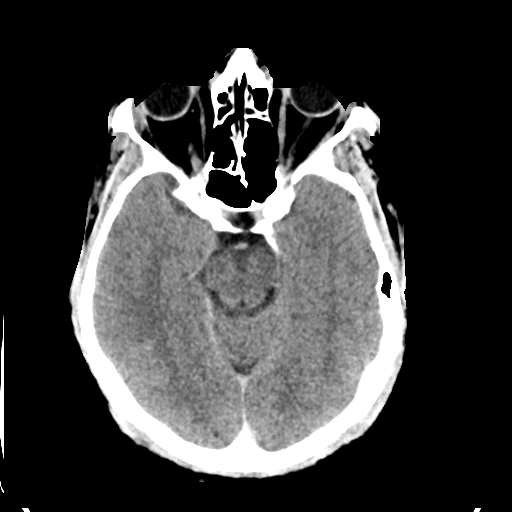
[im 17/34  brain]
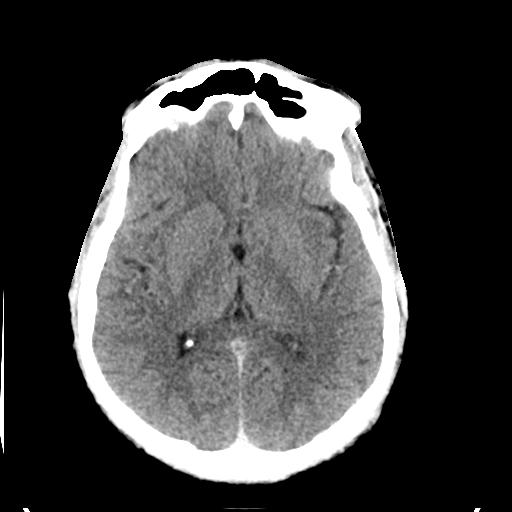
[im 21/34  brain]
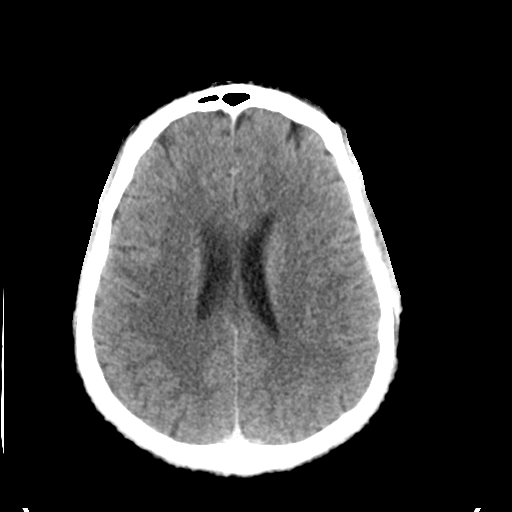
[im 21/34  bone]
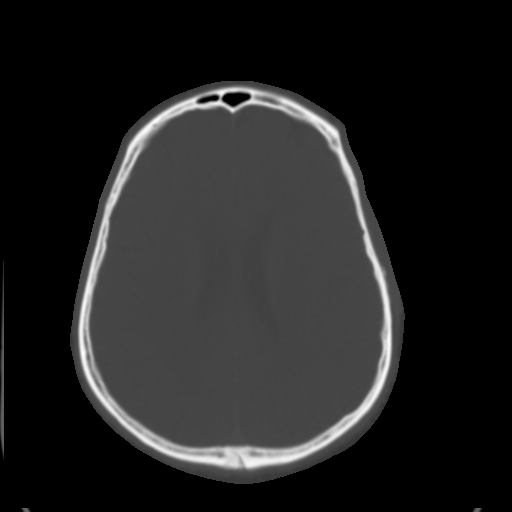
[im 25/34  brain]
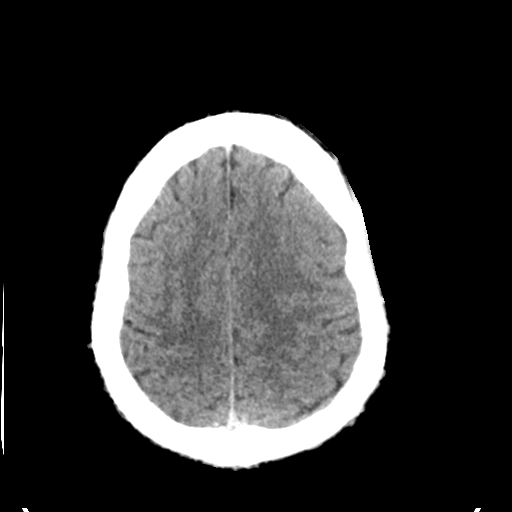
[im 29/34  brain]
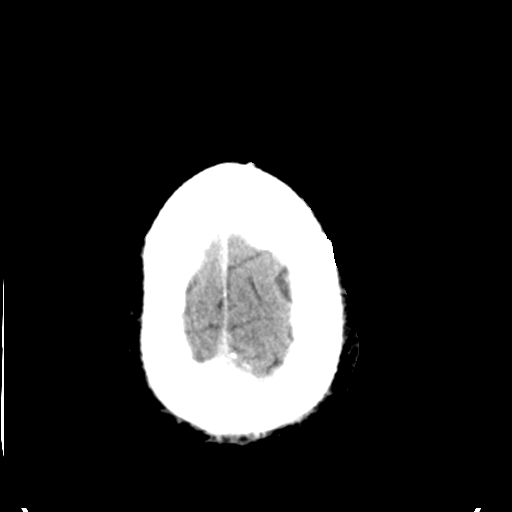

[Series 4: head bone · axial · 0.45mm/px · z∈[-93,-35]mm · 4 of 85 slices shown]
[im 9/85  bone]
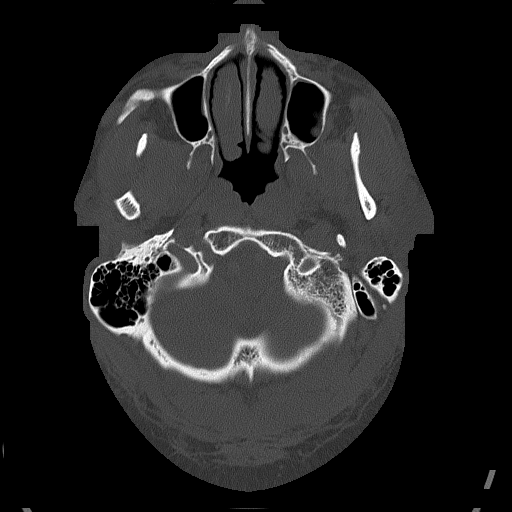
[im 17/85  bone]
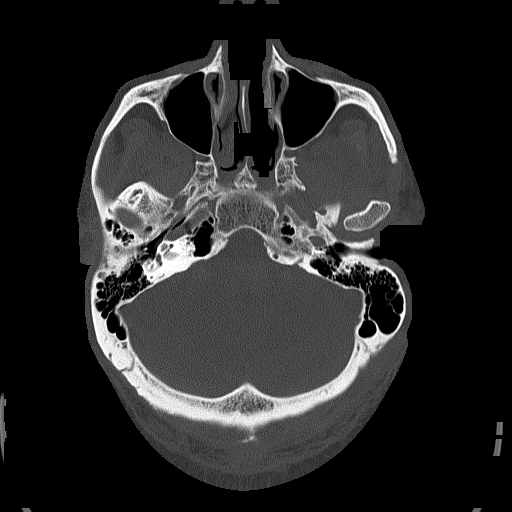
[im 26/85  bone]
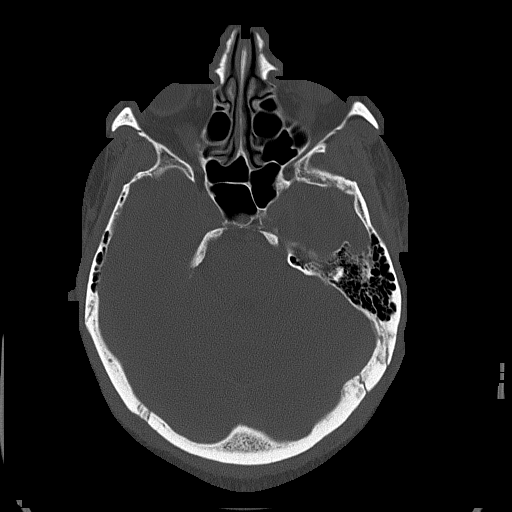
[im 38/85  bone]
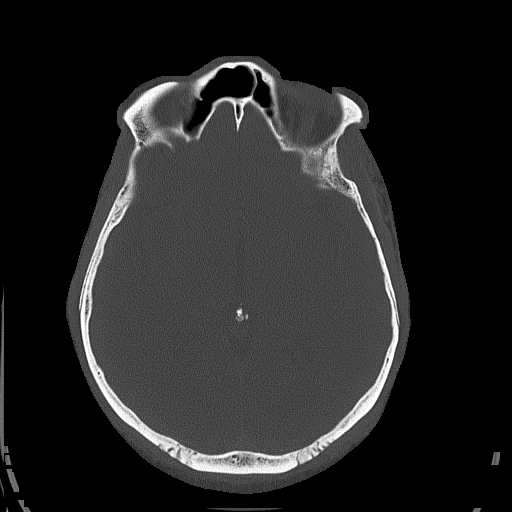

[Series 5: head without cor · coronal · non-contrast · 0.38mm/px · 3 of 74 slices shown]
[im 25/74  brain]
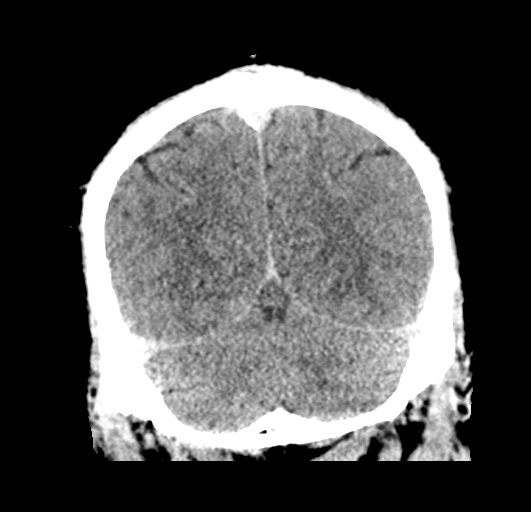
[im 33/74  brain]
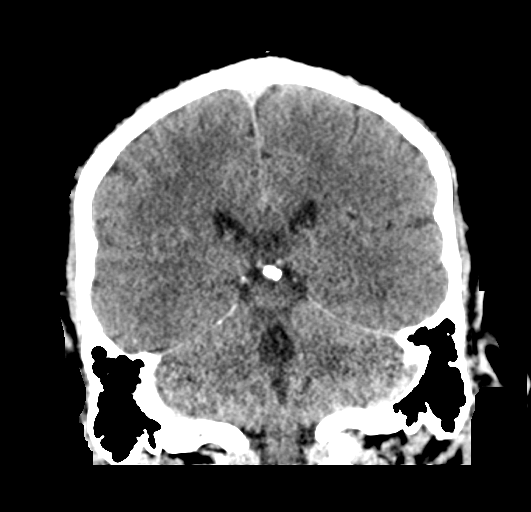
[im 41/74  brain]
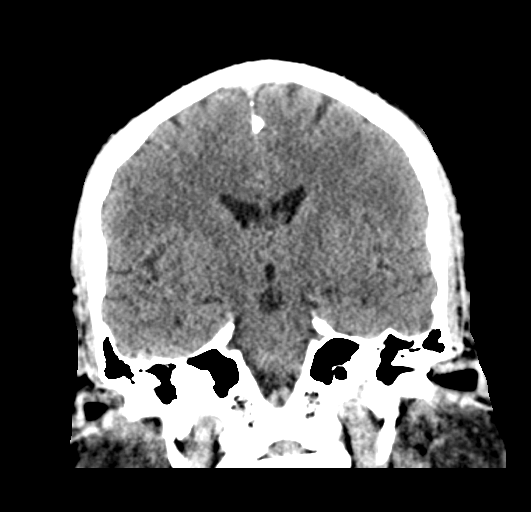

[Series 6: head without sag · sagittal · non-contrast · 0.35mm/px · 3 of 67 slices shown]
[im 23/67  brain]
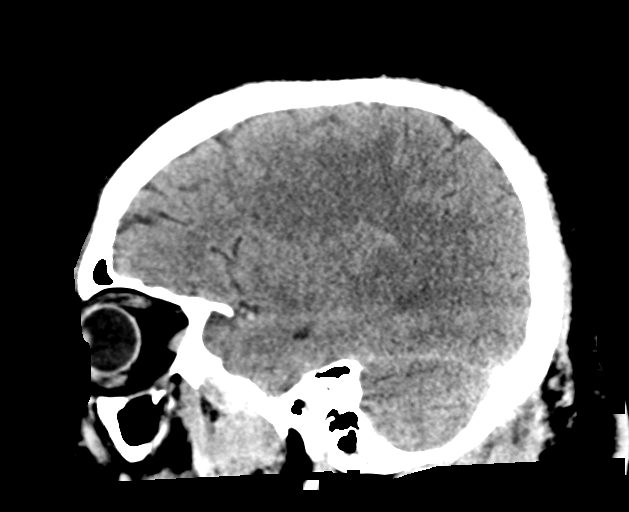
[im 34/67  brain]
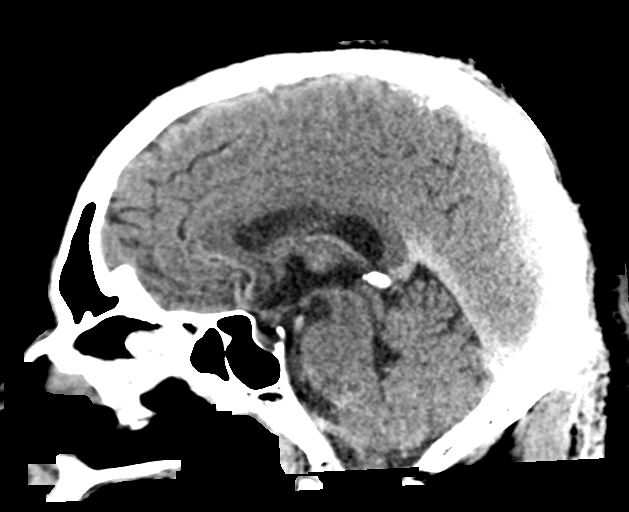
[im 45/67  brain]
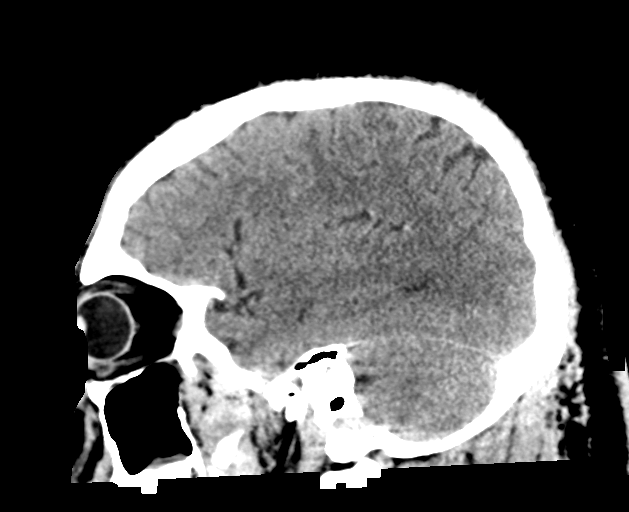

[17 of 47 positions shown; findings below may reference images not displayed]

FINDINGS: Brain: No evidence of acute infarction, hemorrhage, hydrocephalus,
extra-axial collection or mass lesion/mass effect.

Vascular: No hyperdense vessel or unexpected calcification.

Skull: Normal. Negative for fracture or focal lesion.

Sinuses/Orbits: No acute finding.
IMPRESSION: No hemorrhage or detected infarct.

## 2021-09-12 IMAGING — MR MR HEAD W/O CM
12 of 14 series · 40 of 48 positions shown · non-contrast
Comparison: Head CT from earlier today

CLINICAL DATA: Stroke follow-up.  Left facial droop.

EXAM:
MRI HEAD WITHOUT CONTRAST
TECHNIQUE: Multiplanar, multiecho pulse sequences of the brain and surrounding
structures were obtained without intravenous contrast.

[Series 5: DWI · axial · 3.0mm · 0.96mm/px · z∈[-103,+76]mm · 6 of 121 slices shown (1 of 4)]
[im 1/121]
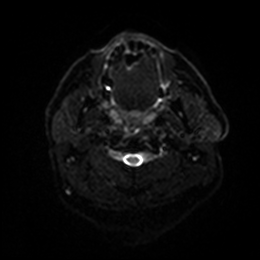
[im 25/121]
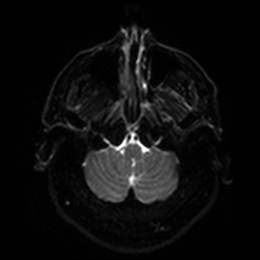
[im 49/121]
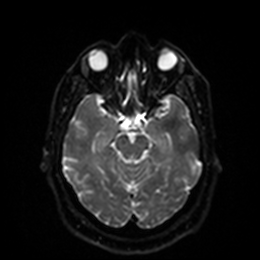
[im 73/121]
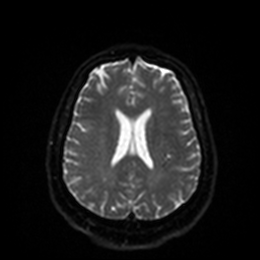
[im 97/121]
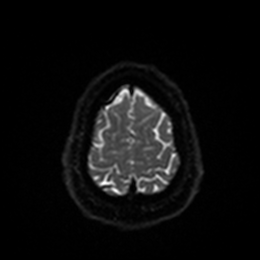
[im 121/121]
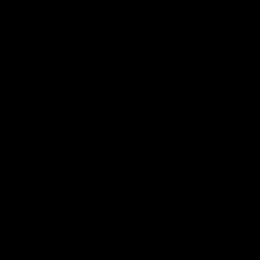

[Series 6: DWI · axial · 3.0mm · 0.96mm/px · z∈[-103,+71]mm · 3 of 59 slices shown (2 of 4)]
[im 1/59]
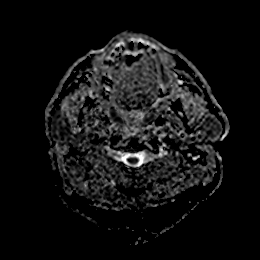
[im 30/59]
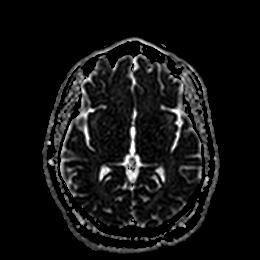
[im 59/59]
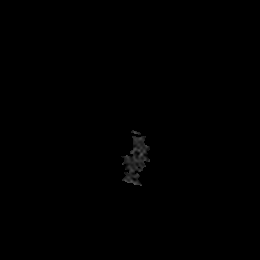

[Series 7: DWI · coronal · 4.0mm · 0.88mm/px · 5 of 88 slices shown (3 of 4)]
[im 1/88]
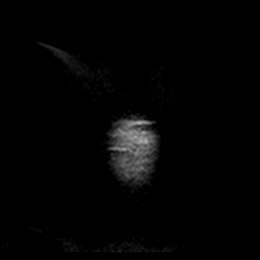
[im 22/88]
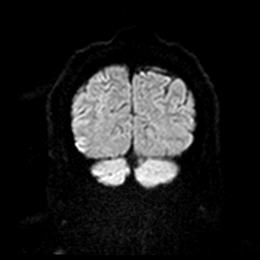
[im 44/88]
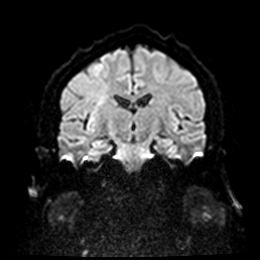
[im 66/88]
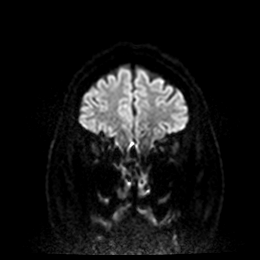
[im 88/88]
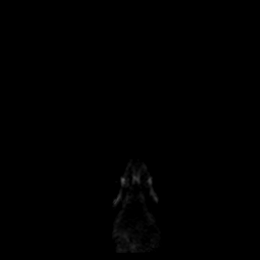

[Series 8: DWI · coronal · 4.0mm · 0.88mm/px · 3 of 44 slices shown (4 of 4)]
[im 1/44]
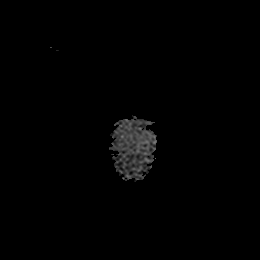
[im 22/44]
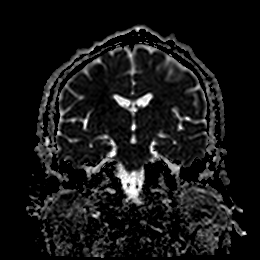
[im 44/44]
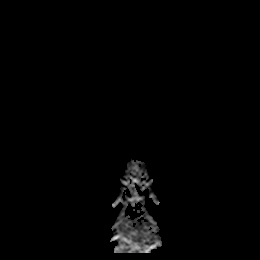

[Series 9: T2 · axial · 5.0mm · 0.78mm/px · z∈[-100,+73]mm · 2 of 30 slices shown (1 of 3)]
[im 1/30]
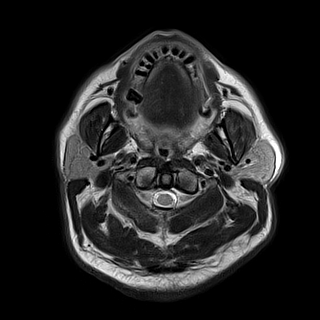
[im 30/30]
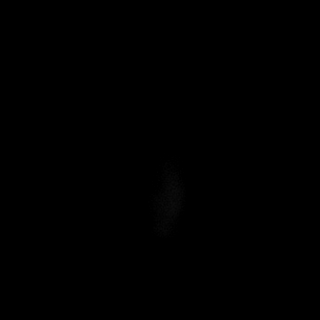

[Series 10: T1 · sagittal · 5.0mm · 0.78mm/px · 2 of 30 slices shown]
[im 1/30]
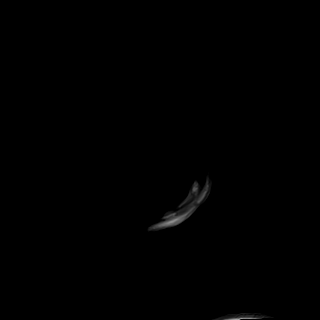
[im 30/30]
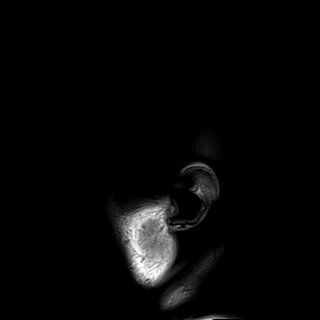

[Series 16: mag_images · axial · 3.0mm · 0.98mm/px · z∈[-123,+52]mm · 4 of 60 slices shown]
[im 1/60]
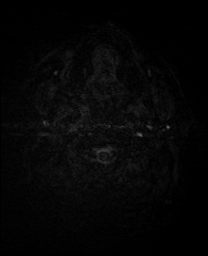
[im 20/60]
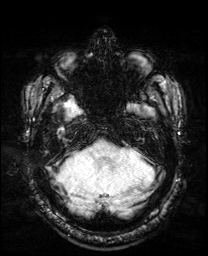
[im 40/60]
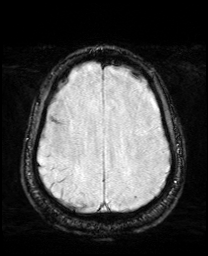
[im 60/60]
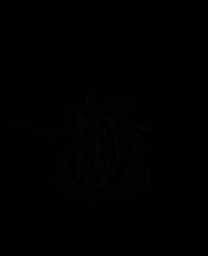

[Series 17: pha_images · axial · 3.0mm · 0.98mm/px · z∈[-123,+52]mm · 4 of 60 slices shown]
[im 1/60]
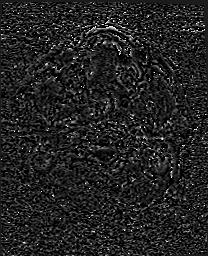
[im 20/60]
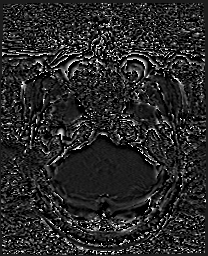
[im 40/60]
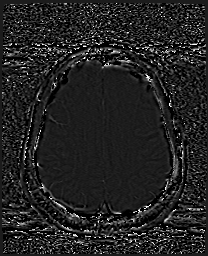
[im 60/60]
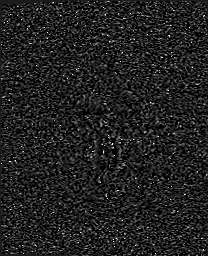

[Series 18: swi_images · axial · 3.0mm · 0.98mm/px · z∈[-123,+52]mm · 4 of 60 slices shown]
[im 1/60]
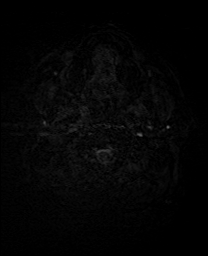
[im 20/60]
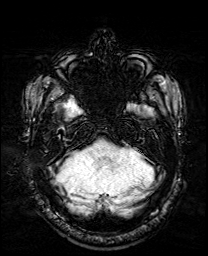
[im 40/60]
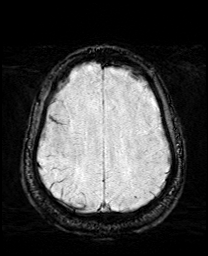
[im 60/60]
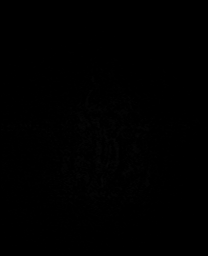

[Series 19: mip_images(sw) · axial · 24.0mm · 0.98mm/px · z∈[-112,+41]mm · 3 of 53 slices shown]
[im 1/53]
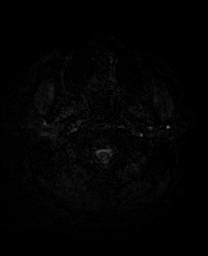
[im 27/53]
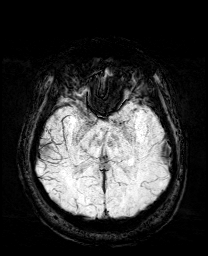
[im 53/53]
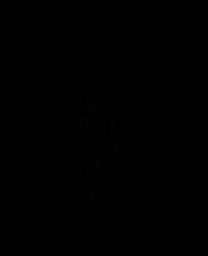

[Series 21: T2 · coronal · 5.0mm · 0.34mm/px · 2 of 37 slices shown (2 of 3)]
[im 1/37]
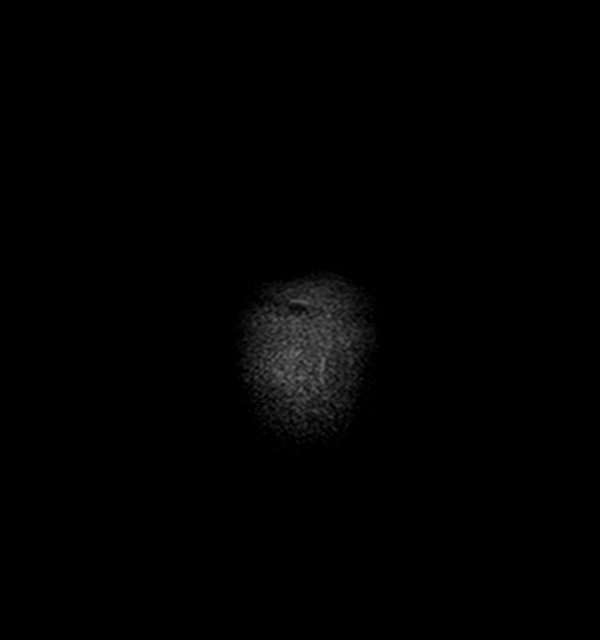
[im 37/37]
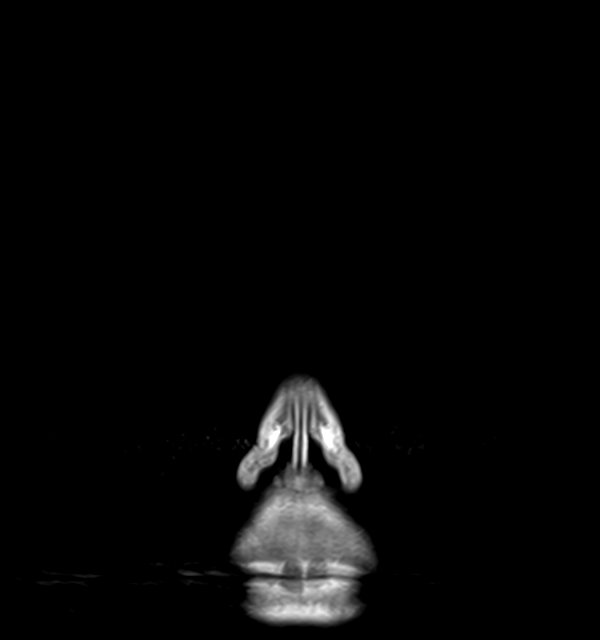

[Series 23: T2 · coronal · 5.0mm · 0.72mm/px · 2 of 37 slices shown (3 of 3)]
[im 1/37]
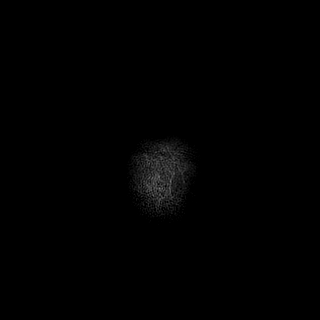
[im 37/37]
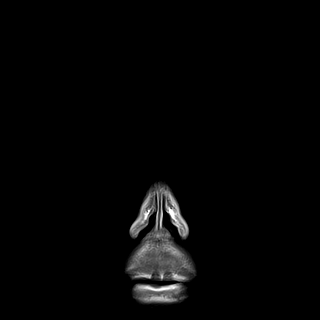

[40 of 48 positions shown; findings below may reference images not displayed]

FINDINGS: Brain: Small areas of restricted diffusion along the right upper
frontal cortex and to a lesser extent in the subjacent white matter.
No acute hemorrhage, hydrocephalus, mass, or collection. No
pre-existing infarct or brain atrophy. Accentuated intravascular
gradient hypointensity along the right cerebral hemisphere.

Vascular: Normal flow voids

Skull and upper cervical spine: Normal marrow signal

Sinuses/Orbits: Mild mucosal thickening in the paranasal sinuses.
IMPRESSION: 1. Tiny acute infarcts in the upper division right MCA cortex.
2. Accentuated right cerebral cortical vessels on gradient imaging,
likely slow flow or compensating cortical collaterals. This may
indicate that the right MCA stenosis on admission CTA may persist.

## 2021-09-12 IMAGING — CT CT ANGIO HEAD-NECK (W OR W/O PERF)
2 of 7 series · 8 of 33 positions shown · IV contrast (OMNI 350)
Comparison: None.

CLINICAL DATA: Left-sided deficits

EXAM:
CT HEAD WITHOUT CONTRAST
CT ANGIOGRAPHY OF THE HEAD AND NECK
TECHNIQUE: Contiguous axial images were obtained from the base of the skull
through the vertex without intravenous contrast. Multidetector CT
imaging of the head and neck was performed using the standard
protocol during bolus administration of intravenous contrast.
Multiplanar CT image reconstructions and MIPs were obtained to
evaluate the vascular anatomy. Carotid stenosis measurements (when
applicable) are obtained utilizing NASCET criteria, using the distal
internal carotid diameter as the denominator.

[Series 5: cta neck · axial · 0.43mm/px · z∈[-273,-157]mm · 2 of 176 slices shown]
[im 59/176  soft-tissue]
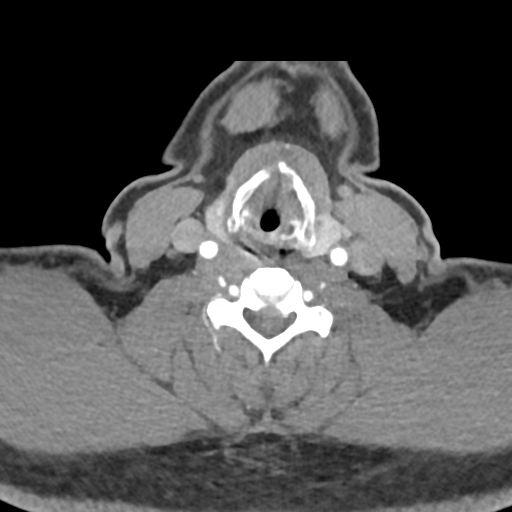
[im 117/176  soft-tissue]
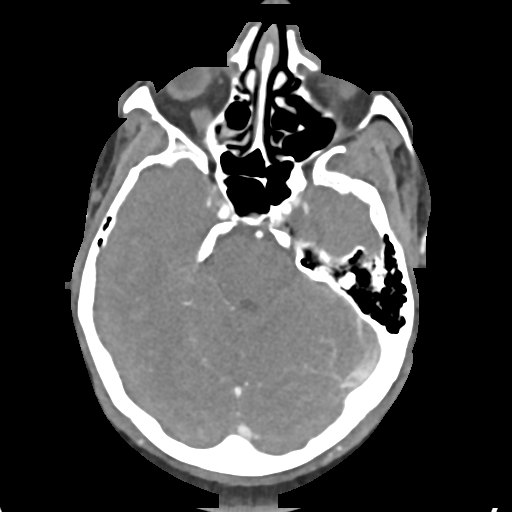

[Series 7: cta neck axial · axial · 0.50mm/px · z∈[-340,-90]mm · 6 of 350 slices shown]
[im 50/350  soft-tissue]
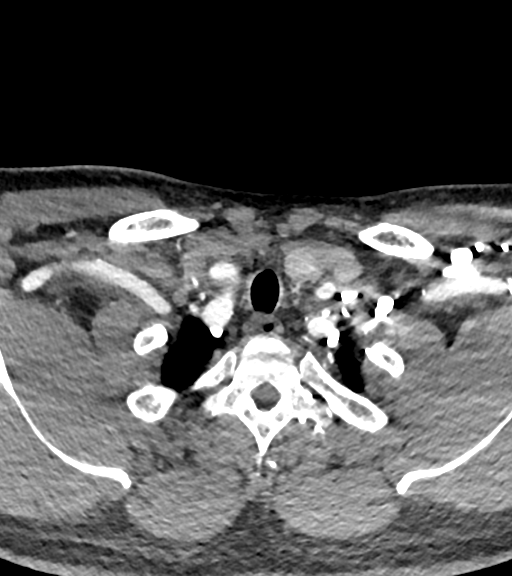
[im 100/350  bone]
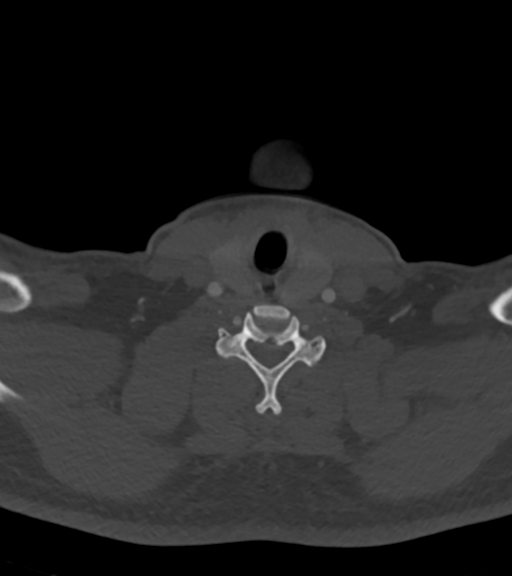
[im 150/350  soft-tissue]
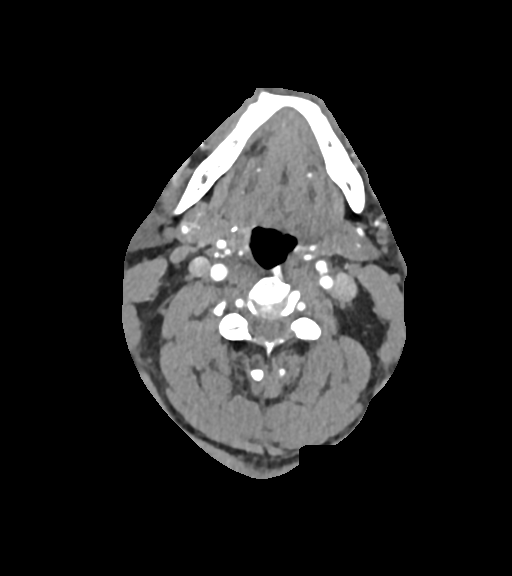
[im 200/350  bone]
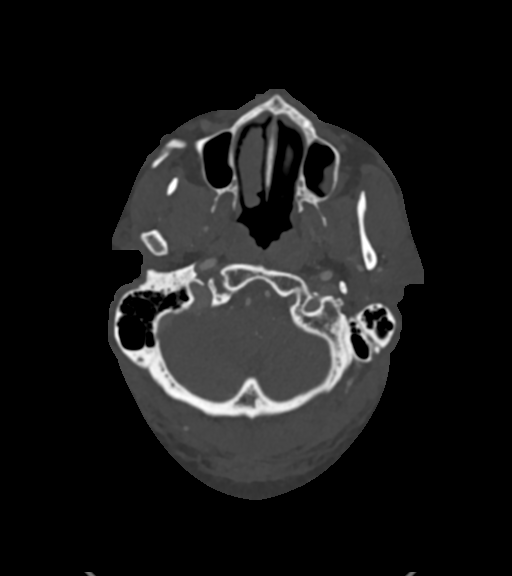
[im 250/350  soft-tissue]
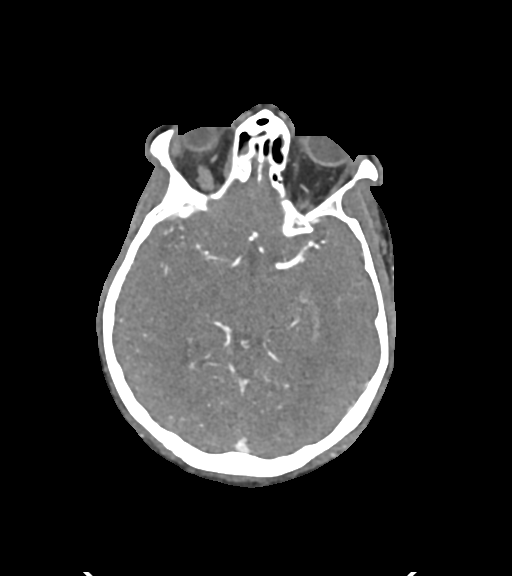
[im 300/350  bone]
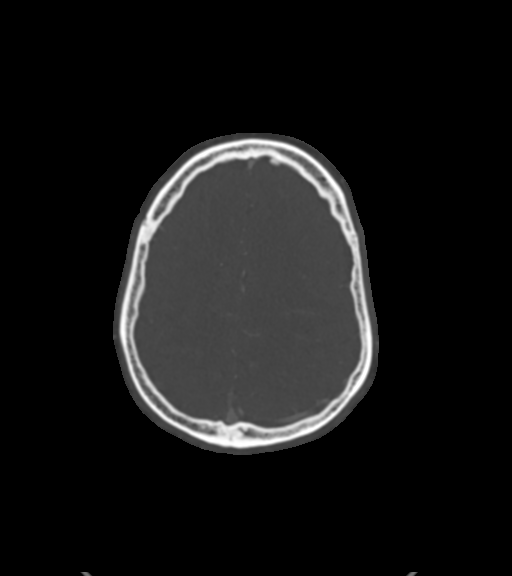

[8 of 33 positions shown; findings below may reference images not displayed]

RADIATION DOSE REDUCTION: This exam was performed according to the
departmental dose-optimization program which includes automated
exposure control, adjustment of the mA and/or kV according to
patient size and/or use of iterative reconstruction technique.

CONTRAST:  75mL OMNIPAQUE IOHEXOL 350 MG/ML SOLN
FINDINGS: CT HEAD FINDINGS

Brain: There is no mass, hemorrhage or extra-axial collection. The
size and configuration of the ventricles and extra-axial CSF spaces
are normal. The brain parenchyma is normal, without evidence of
acute or chronic infarction.

Vascular: No abnormal hyperdensity of the major intracranial
arteries or dural venous sinuses. No intracranial atherosclerosis.

Skull: The visualized skull base, calvarium and extracranial soft
tissues are normal.

Sinuses/Orbits: No fluid levels or advanced mucosal thickening of
the visualized paranasal sinuses. No mastoid or middle ear effusion.
The orbits are normal.

ASPECTS (Alberta Stroke Program Early CT Score)

- Ganglionic level infarction (caudate, lentiform nuclei, internal
capsule, insula, M1-M3 cortex): 7

- Supraganglionic infarction (M4-M6 cortex): 3

Total score (0-10 with 10 being normal): 10

CTA NECK FINDINGS

SKELETON: There is no bony spinal canal stenosis. No lytic or
blastic lesion.

OTHER NECK: Normal pharynx, larynx and major salivary glands. No
cervical lymphadenopathy. Unremarkable thyroid gland.

UPPER CHEST: No pneumothorax or pleural effusion. No nodules or
masses.

AORTIC ARCH:

There is no calcific atherosclerosis of the aortic arch. There is no
aneurysm, dissection or hemodynamically significant stenosis of the
visualized portion of the aorta. Conventional 3 vessel aortic
branching pattern. The visualized proximal subclavian arteries are
widely patent.

RIGHT CAROTID SYSTEM: Normal without aneurysm, dissection or
stenosis.

LEFT CAROTID SYSTEM: Normal without aneurysm, dissection or
stenosis.

VERTEBRAL ARTERIES: Left dominant configuration. Both origins are
clearly patent. There is no dissection, occlusion or flow-limiting
stenosis to the skull base (V1-V3 segments).

CTA HEAD FINDINGS

POSTERIOR CIRCULATION:

--Vertebral arteries: Normal V4 segments.

--Inferior cerebellar arteries: Normal.

--Basilar artery: Normal.

--Superior cerebellar arteries: Normal.

--Posterior cerebral arteries (PCA): Normal.

ANTERIOR CIRCULATION:

--Intracranial internal carotid arteries: Normal.

--Anterior cerebral arteries (ACA): Normal. Both A1 segments are
present. Patent anterior communicating artery (a-comm).

--Middle cerebral arteries (MCA): There is a short segment occlusion
of the distal right M1 segment measuring 2 mm. There is multifocal
atherosclerotic narrowing of the right M2 segments. Severe stenosis
of the distal left M1 segment.

VENOUS SINUSES: As permitted by contrast timing, patent.

ANATOMIC VARIANTS: None

Review of the MIP images confirms the above findings.
IMPRESSION: 1. Short segment occlusion of the distal right M1 segment.
2. Severe stenosis of the distal left M1 segment.
3. ASPECTS is 10.

Critical Value/emergent results were called by telephone at the time
of interpretation on [DATE] at [DATE] to provider FONT
FONT , who verbally acknowledged these results.

## 2021-09-12 IMAGING — CT CT HEAD CODE STROKE
4 of 5 series · 14 of 47 positions shown, 16 images · non-contrast
Comparison: None.

CLINICAL DATA: Left-sided deficits

EXAM:
CT HEAD WITHOUT CONTRAST
CT ANGIOGRAPHY OF THE HEAD AND NECK
TECHNIQUE: Contiguous axial images were obtained from the base of the skull
through the vertex without intravenous contrast. Multidetector CT
imaging of the head and neck was performed using the standard
protocol during bolus administration of intravenous contrast.
Multiplanar CT image reconstructions and MIPs were obtained to
evaluate the vascular anatomy. Carotid stenosis measurements (when
applicable) are obtained utilizing NASCET criteria, using the distal
internal carotid diameter as the denominator.

[Series 2: head 5.0 st · axial · 0.42mm/px · z∈[-152,-87]mm · 3 of 34 slices shown (1 of 2)]
[im 7/34  brain]
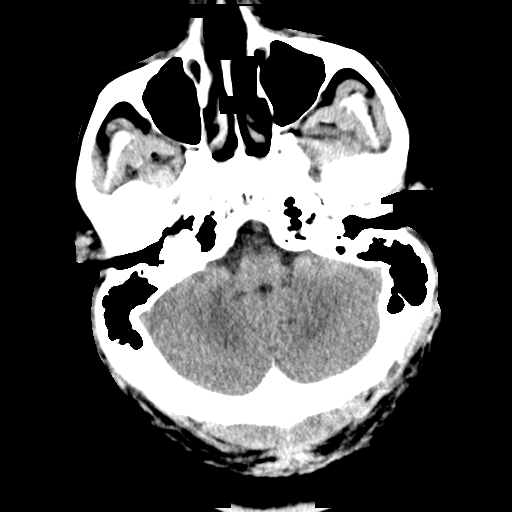
[im 14/34  brain]
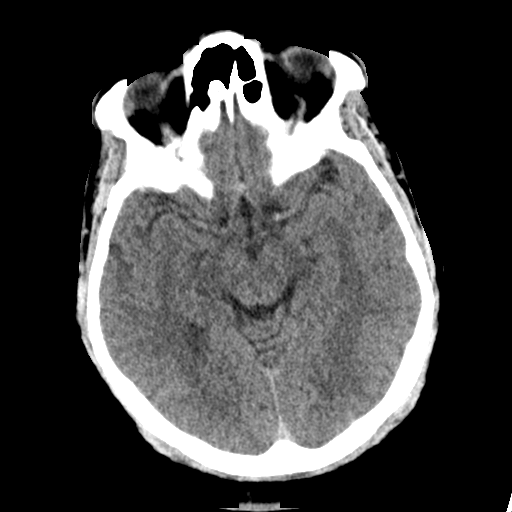
[im 20/34  brain]
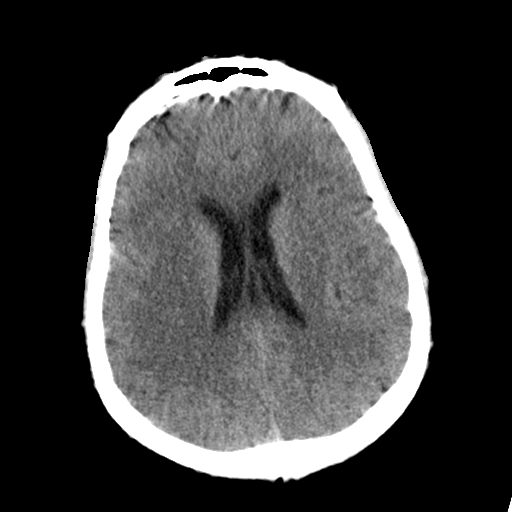

[Series 4: head 5.0 st · axial · 0.42mm/px · z∈[-157,-47]mm · 5 of 34 slices shown, 7 images (2 of 2)]
[im 6/34  brain]
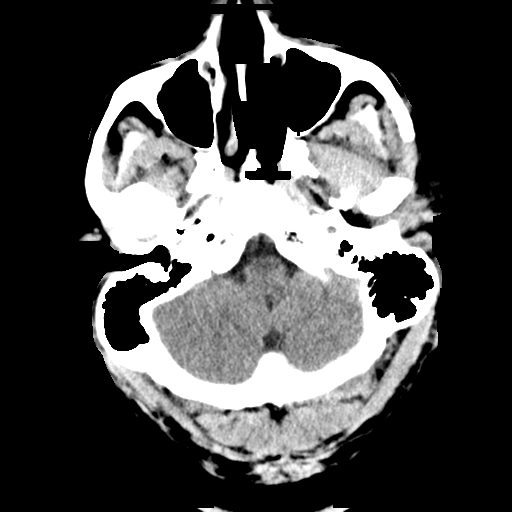
[im 6/34  bone]
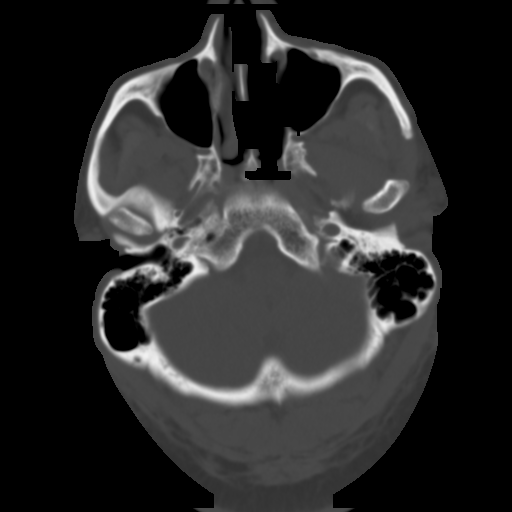
[im 12/34  brain]
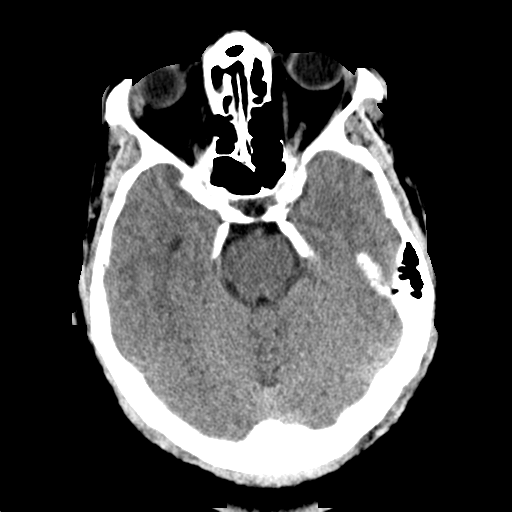
[im 17/34  brain]
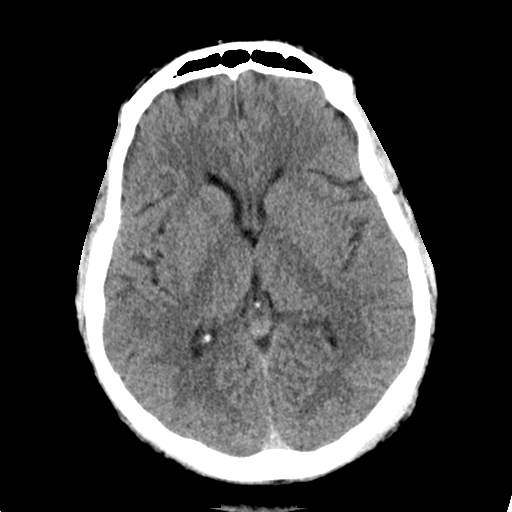
[im 23/34  brain]
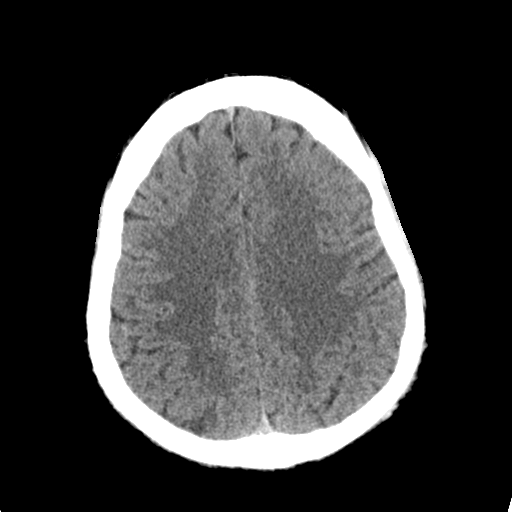
[im 28/34  brain]
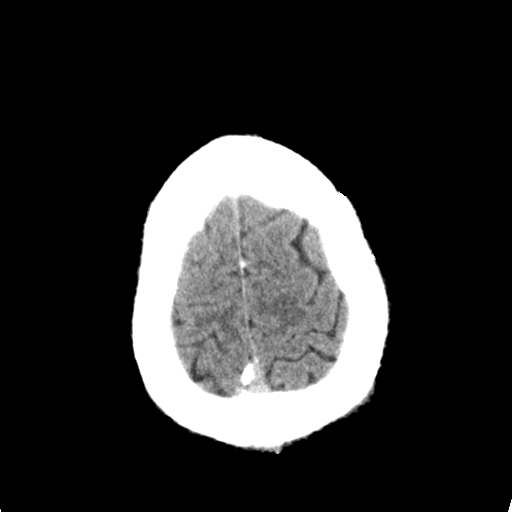
[im 28/34  bone]
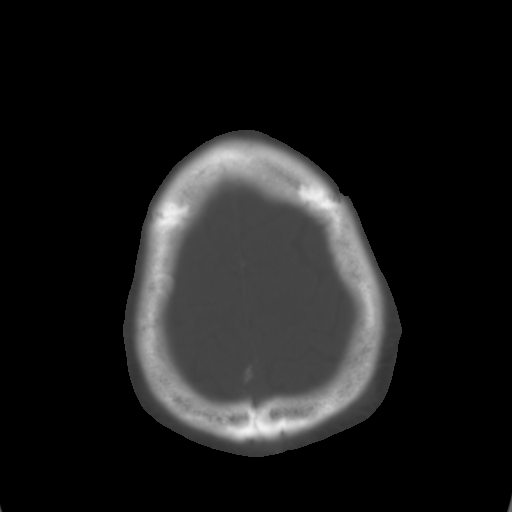

[Series 6: head 3.0 cor st · coronal · 0.35mm/px · 3 of 74 slices shown]
[im 25/74  brain]
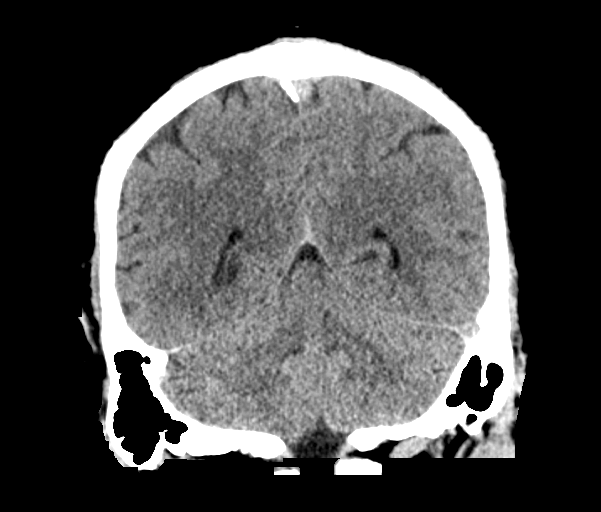
[im 33/74  brain]
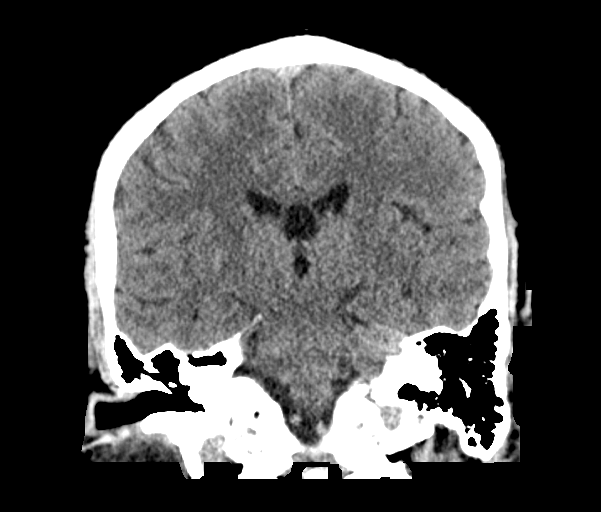
[im 41/74  brain]
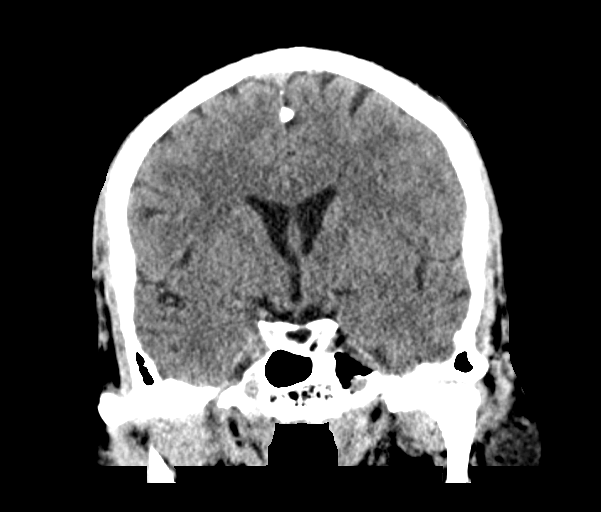

[Series 7: head 3.0 sag st · sagittal · 0.36mm/px · 3 of 67 slices shown]
[im 23/67  brain]
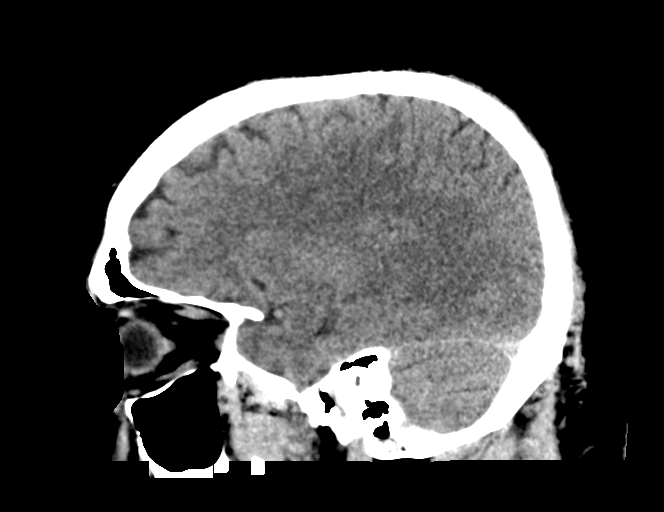
[im 34/67  brain]
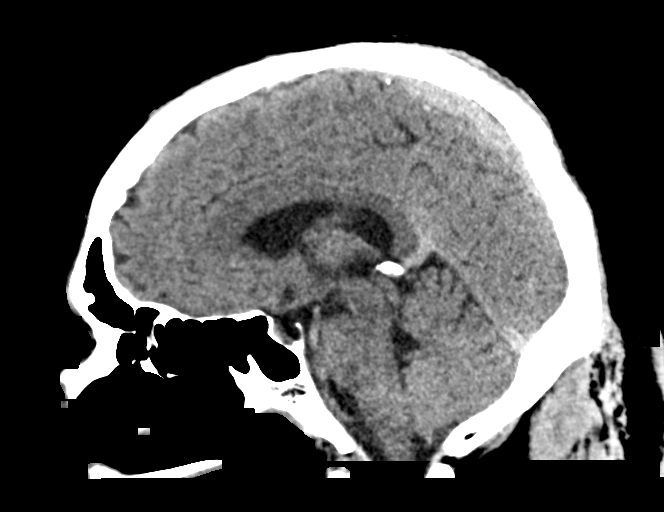
[im 45/67  brain]
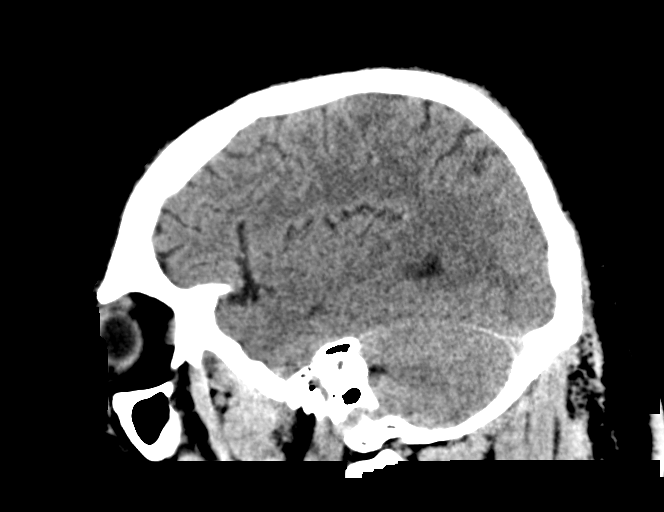

[14 of 47 positions shown; findings below may reference images not displayed]

RADIATION DOSE REDUCTION: This exam was performed according to the
departmental dose-optimization program which includes automated
exposure control, adjustment of the mA and/or kV according to
patient size and/or use of iterative reconstruction technique.

CONTRAST:  75mL OMNIPAQUE IOHEXOL 350 MG/ML SOLN
FINDINGS: CT HEAD FINDINGS

Brain: There is no mass, hemorrhage or extra-axial collection. The
size and configuration of the ventricles and extra-axial CSF spaces
are normal. The brain parenchyma is normal, without evidence of
acute or chronic infarction.

Vascular: No abnormal hyperdensity of the major intracranial
arteries or dural venous sinuses. No intracranial atherosclerosis.

Skull: The visualized skull base, calvarium and extracranial soft
tissues are normal.

Sinuses/Orbits: No fluid levels or advanced mucosal thickening of
the visualized paranasal sinuses. No mastoid or middle ear effusion.
The orbits are normal.

ASPECTS (Alberta Stroke Program Early CT Score)

- Ganglionic level infarction (caudate, lentiform nuclei, internal
capsule, insula, M1-M3 cortex): 7

- Supraganglionic infarction (M4-M6 cortex): 3

Total score (0-10 with 10 being normal): 10

CTA NECK FINDINGS

SKELETON: There is no bony spinal canal stenosis. No lytic or
blastic lesion.

OTHER NECK: Normal pharynx, larynx and major salivary glands. No
cervical lymphadenopathy. Unremarkable thyroid gland.

UPPER CHEST: No pneumothorax or pleural effusion. No nodules or
masses.

AORTIC ARCH:

There is no calcific atherosclerosis of the aortic arch. There is no
aneurysm, dissection or hemodynamically significant stenosis of the
visualized portion of the aorta. Conventional 3 vessel aortic
branching pattern. The visualized proximal subclavian arteries are
widely patent.

RIGHT CAROTID SYSTEM: Normal without aneurysm, dissection or
stenosis.

LEFT CAROTID SYSTEM: Normal without aneurysm, dissection or
stenosis.

VERTEBRAL ARTERIES: Left dominant configuration. Both origins are
clearly patent. There is no dissection, occlusion or flow-limiting
stenosis to the skull base (V1-V3 segments).

CTA HEAD FINDINGS

POSTERIOR CIRCULATION:

--Vertebral arteries: Normal V4 segments.

--Inferior cerebellar arteries: Normal.

--Basilar artery: Normal.

--Superior cerebellar arteries: Normal.

--Posterior cerebral arteries (PCA): Normal.

ANTERIOR CIRCULATION:

--Intracranial internal carotid arteries: Normal.

--Anterior cerebral arteries (ACA): Normal. Both A1 segments are
present. Patent anterior communicating artery (a-comm).

--Middle cerebral arteries (MCA): There is a short segment occlusion
of the distal right M1 segment measuring 2 mm. There is multifocal
atherosclerotic narrowing of the right M2 segments. Severe stenosis
of the distal left M1 segment.

VENOUS SINUSES: As permitted by contrast timing, patent.

ANATOMIC VARIANTS: None

Review of the MIP images confirms the above findings.
IMPRESSION: 1. Short segment occlusion of the distal right M1 segment.
2. Severe stenosis of the distal left M1 segment.
3. ASPECTS is 10.

Critical Value/emergent results were called by telephone at the time
of interpretation on [DATE] at [DATE] to provider FONT
FONT , who verbally acknowledged these results.

## 2021-09-12 SURGERY — IR WITH ANESTHESIA
Anesthesia: General

## 2021-09-12 MED ORDER — LACTATED RINGERS IV SOLN: INTRAVENOUS | Status: DC | PRN

## 2021-09-12 MED ORDER — CLOPIDOGREL BISULFATE 300 MG PO TABS
300.0000 mg | ORAL_TABLET | Freq: Once | ORAL | Status: DC
Start: 2021-09-12 — End: 2021-09-12

## 2021-09-12 MED ORDER — LABETALOL HCL 5 MG/ML IV SOLN
0.5000 mg/min | Status: DC
  Administered 2021-09-12: 0.5 mg/min via INTRAVENOUS
  Filled 2021-09-12 (×2): qty 80

## 2021-09-12 MED ORDER — LABETALOL HCL 5 MG/ML IV SOLN
INTRAVENOUS | Status: DC | PRN
  Administered 2021-09-12: 10 mg via INTRAVENOUS

## 2021-09-12 MED ORDER — CLEVIDIPINE BUTYRATE 0.5 MG/ML IV EMUL
0.0000 mg/h | INTRAVENOUS | Status: DC
  Administered 2021-09-12: 2 mg/h via INTRAVENOUS
  Administered 2021-09-12: 18 mg/h via INTRAVENOUS
  Administered 2021-09-12 (×3): 6 mg/h via INTRAVENOUS

## 2021-09-12 MED ORDER — ATORVASTATIN CALCIUM 40 MG PO TABS
40.0000 mg | ORAL_TABLET | Freq: Every day | ORAL | Status: DC
  Administered 2021-09-12 – 2021-09-16 (×5): 40 mg via ORAL
  Filled 2021-09-12 (×6): qty 1

## 2021-09-12 MED ORDER — SUGAMMADEX SODIUM 200 MG/2ML IV SOLN
INTRAVENOUS | Status: DC | PRN
  Administered 2021-09-12 (×4): 100 mg via INTRAVENOUS

## 2021-09-12 MED ORDER — CLOPIDOGREL BISULFATE 75 MG PO TABS: 75.0000 mg | ORAL_TABLET | Freq: Every day | ORAL | Status: DC

## 2021-09-12 MED ORDER — ACETAMINOPHEN 160 MG/5ML PO SOLN
650.0000 mg | ORAL | Status: DC | PRN
  Administered 2021-09-16 – 2021-10-04 (×40): 650 mg
  Filled 2021-09-12 (×40): qty 20.3

## 2021-09-12 MED ORDER — ACETAMINOPHEN 325 MG PO TABS: 650.0000 mg | ORAL_TABLET | ORAL | Status: DC | PRN

## 2021-09-12 MED ORDER — ACETAMINOPHEN 325 MG PO TABS
650.0000 mg | ORAL_TABLET | ORAL | Status: DC | PRN
  Administered 2021-09-12 – 2021-10-01 (×6): 650 mg via ORAL
  Filled 2021-09-12 (×6): qty 2

## 2021-09-12 MED ORDER — ACETAMINOPHEN 650 MG RE SUPP
650.0000 mg | RECTAL | Status: DC | PRN
  Administered 2021-09-22: 650 mg via RECTAL
  Filled 2021-09-12: qty 1

## 2021-09-12 MED ORDER — TENECTEPLASE FOR STROKE
25.0000 mg | PACK | Freq: Once | INTRAVENOUS | Status: AC
  Administered 2021-09-12: 25 mg via INTRAVENOUS
  Filled 2021-09-12: qty 10

## 2021-09-12 MED ORDER — ASPIRIN EC 325 MG PO TBEC: 325.0000 mg | DELAYED_RELEASE_TABLET | Freq: Once | ORAL | Status: DC

## 2021-09-12 MED ORDER — ACETAMINOPHEN 160 MG/5ML PO SOLN: 650.0000 mg | ORAL | Status: DC | PRN

## 2021-09-12 MED ORDER — STROKE: EARLY STAGES OF RECOVERY BOOK
Freq: Once | Status: DC
  Filled 2021-09-12: qty 1

## 2021-09-12 MED ORDER — FENTANYL CITRATE (PF) 250 MCG/5ML IJ SOLN
INTRAMUSCULAR | Status: DC | PRN
  Administered 2021-09-12: 100 ug via INTRAVENOUS

## 2021-09-12 MED ORDER — INSULIN ASPART 100 UNIT/ML IJ SOLN
0.0000 [IU] | INTRAMUSCULAR | Status: DC
  Administered 2021-09-12: 11 [IU] via SUBCUTANEOUS

## 2021-09-12 MED ORDER — CHLORHEXIDINE GLUCONATE CLOTH 2 % EX PADS
6.0000 | MEDICATED_PAD | Freq: Every day | CUTANEOUS | Status: DC
  Administered 2021-09-13 – 2021-10-08 (×28): 6 via TOPICAL

## 2021-09-12 MED ORDER — PANTOPRAZOLE SODIUM 40 MG IV SOLR
40.0000 mg | Freq: Every day | INTRAVENOUS | Status: DC
  Administered 2021-09-12 – 2021-09-13 (×2): 40 mg via INTRAVENOUS
  Filled 2021-09-12 (×2): qty 10

## 2021-09-12 MED ORDER — ONDANSETRON HCL 4 MG/2ML IJ SOLN
INTRAMUSCULAR | Status: DC | PRN
  Administered 2021-09-12: 4 mg via INTRAVENOUS

## 2021-09-12 MED ORDER — FENTANYL CITRATE (PF) 100 MCG/2ML IJ SOLN
INTRAMUSCULAR | Status: AC
  Filled 2021-09-12: qty 2

## 2021-09-12 MED ORDER — INSULIN ASPART 100 UNIT/ML IJ SOLN
0.0000 [IU] | INTRAMUSCULAR | Status: DC
  Administered 2021-09-12: 7 [IU] via SUBCUTANEOUS
  Administered 2021-09-12: 11 [IU] via SUBCUTANEOUS

## 2021-09-12 MED ORDER — LABETALOL HCL 5 MG/ML IV SOLN: 10.0000 mg | INTRAVENOUS | Status: DC | PRN

## 2021-09-12 MED ORDER — SODIUM CHLORIDE 0.9 % IV SOLN
50.0000 mL/h | INTRAVENOUS | Status: DC
  Administered 2021-09-12 – 2021-09-14 (×6): 50 mL/h via INTRAVENOUS

## 2021-09-12 MED ORDER — ROCURONIUM BROMIDE 10 MG/ML (PF) SYRINGE
PREFILLED_SYRINGE | INTRAVENOUS | Status: DC | PRN
Start: 2021-09-12 — End: 2021-09-12
  Administered 2021-09-12: 50 mg via INTRAVENOUS

## 2021-09-12 MED ORDER — ONDANSETRON HCL 4 MG/2ML IJ SOLN: 4.0000 mg | Freq: Four times a day (QID) | INTRAMUSCULAR | Status: DC | PRN

## 2021-09-12 MED ORDER — SUCCINYLCHOLINE CHLORIDE 200 MG/10ML IV SOSY
PREFILLED_SYRINGE | INTRAVENOUS | Status: DC | PRN
  Administered 2021-09-12: 160 mg via INTRAVENOUS

## 2021-09-12 MED ORDER — SODIUM CHLORIDE 0.9% FLUSH: 3.0000 mL | Freq: Once | INTRAVENOUS | Status: DC

## 2021-09-12 MED ORDER — IOHEXOL 300 MG/ML  SOLN
100.0000 mL | Freq: Once | INTRAMUSCULAR | Status: AC | PRN
  Administered 2021-09-12: 30 mL via INTRA_ARTERIAL

## 2021-09-12 MED ORDER — LABETALOL HCL 5 MG/ML IV SOLN
10.0000 mg | Freq: Once | INTRAVENOUS | Status: AC
  Administered 2021-09-12: 10 mg via INTRAVENOUS

## 2021-09-12 MED ORDER — PROPOFOL 10 MG/ML IV BOLUS
INTRAVENOUS | Status: DC | PRN
Start: 2021-09-12 — End: 2021-09-12
  Administered 2021-09-12: 200 mg via INTRAVENOUS

## 2021-09-12 MED ORDER — ACETAMINOPHEN 650 MG RE SUPP: 650.0000 mg | RECTAL | Status: DC | PRN

## 2021-09-12 MED ORDER — CLEVIDIPINE BUTYRATE 0.5 MG/ML IV EMUL
0.0000 mg/h | INTRAVENOUS | Status: DC
  Administered 2021-09-12: 21 mg/h via INTRAVENOUS
  Administered 2021-09-12: 32 mg/h via INTRAVENOUS
  Administered 2021-09-12: 22 mg/h via INTRAVENOUS
  Administered 2021-09-12: 32 mg/h via INTRAVENOUS
  Administered 2021-09-12: 21 mg/h via INTRAVENOUS
  Administered 2021-09-12: 22 mg/h via INTRAVENOUS
  Administered 2021-09-12: 32 mg/h via INTRAVENOUS
  Administered 2021-09-13 (×2): 22 mg/h via INTRAVENOUS
  Administered 2021-09-13: 20 mg/h via INTRAVENOUS
  Administered 2021-09-13: 24 mg/h via INTRAVENOUS
  Administered 2021-09-13: 22 mg/h via INTRAVENOUS
  Administered 2021-09-15: 2 mg/h via INTRAVENOUS
  Administered 2021-09-15: 21 mg/h via INTRAVENOUS
  Administered 2021-09-16 (×5): 32 mg/h via INTRAVENOUS
  Administered 2021-09-16: 26 mg/h via INTRAVENOUS
  Administered 2021-09-16 – 2021-09-17 (×22): 32 mg/h via INTRAVENOUS
  Administered 2021-09-18: 28 mg/h via INTRAVENOUS
  Administered 2021-09-18 (×2): 32 mg/h via INTRAVENOUS
  Administered 2021-09-18: 30 mg/h via INTRAVENOUS
  Administered 2021-09-18 (×2): 32 mg/h via INTRAVENOUS
  Administered 2021-09-18: 30 mg/h via INTRAVENOUS
  Administered 2021-09-18: 28 mg/h via INTRAVENOUS
  Filled 2021-09-12 (×4): qty 100
  Filled 2021-09-12: qty 200
  Filled 2021-09-12 (×2): qty 100
  Filled 2021-09-12: qty 200
  Filled 2021-09-12 (×2): qty 100
  Filled 2021-09-12: qty 50
  Filled 2021-09-12: qty 200
  Filled 2021-09-12 (×2): qty 100
  Filled 2021-09-12: qty 200
  Filled 2021-09-12 (×3): qty 100
  Filled 2021-09-12: qty 200
  Filled 2021-09-12 (×2): qty 100
  Filled 2021-09-12: qty 200
  Filled 2021-09-12: qty 100
  Filled 2021-09-12: qty 50
  Filled 2021-09-12: qty 100
  Filled 2021-09-12 (×3): qty 200
  Filled 2021-09-12 (×5): qty 100
  Filled 2021-09-12: qty 200
  Filled 2021-09-12 (×2): qty 100
  Filled 2021-09-12: qty 200
  Filled 2021-09-12 (×5): qty 100

## 2021-09-12 MED ORDER — LIDOCAINE 2% (20 MG/ML) 5 ML SYRINGE
INTRAMUSCULAR | Status: DC | PRN
  Administered 2021-09-12: 60 mg via INTRAVENOUS

## 2021-09-12 MED ORDER — IOHEXOL 350 MG/ML SOLN
75.0000 mL | Freq: Once | INTRAVENOUS | Status: AC | PRN
  Administered 2021-09-12: 75 mL via INTRAVENOUS

## 2021-09-12 MED ORDER — INSULIN ASPART 100 UNIT/ML IJ SOLN
0.0000 [IU] | INTRAMUSCULAR | Status: DC
  Administered 2021-09-12: 11 [IU] via SUBCUTANEOUS
  Administered 2021-09-13 (×3): 7 [IU] via SUBCUTANEOUS
  Administered 2021-09-13: 4 [IU] via SUBCUTANEOUS
  Administered 2021-09-13 – 2021-09-14 (×2): 11 [IU] via SUBCUTANEOUS
  Administered 2021-09-14: 4 [IU] via SUBCUTANEOUS
  Administered 2021-09-14: 3 [IU] via SUBCUTANEOUS
  Administered 2021-09-14 (×3): 7 [IU] via SUBCUTANEOUS
  Administered 2021-09-14: 4 [IU] via SUBCUTANEOUS
  Administered 2021-09-15: 11 [IU] via SUBCUTANEOUS
  Administered 2021-09-15: 7 [IU] via SUBCUTANEOUS
  Administered 2021-09-15 (×2): 4 [IU] via SUBCUTANEOUS
  Administered 2021-09-15: 7 [IU] via SUBCUTANEOUS
  Administered 2021-09-16: 15 [IU] via SUBCUTANEOUS
  Administered 2021-09-16: 2 [IU] via SUBCUTANEOUS
  Administered 2021-09-16: 11 [IU] via SUBCUTANEOUS
  Administered 2021-09-16: 7 [IU] via SUBCUTANEOUS
  Administered 2021-09-16: 11 [IU] via SUBCUTANEOUS
  Administered 2021-09-16: 7 [IU] via SUBCUTANEOUS
  Administered 2021-09-17: 11 [IU] via SUBCUTANEOUS
  Administered 2021-09-17: 7 [IU] via SUBCUTANEOUS
  Administered 2021-09-17: 15 [IU] via SUBCUTANEOUS
  Administered 2021-09-17: 7 [IU] via SUBCUTANEOUS
  Administered 2021-09-17: 11 [IU] via SUBCUTANEOUS
  Administered 2021-09-17 (×2): 15 [IU] via SUBCUTANEOUS
  Administered 2021-09-18: 4 [IU] via SUBCUTANEOUS
  Administered 2021-09-18: 7 [IU] via SUBCUTANEOUS
  Administered 2021-09-18: 15 [IU] via SUBCUTANEOUS
  Administered 2021-09-18: 11 [IU] via SUBCUTANEOUS
  Administered 2021-09-18: 15 [IU] via SUBCUTANEOUS
  Administered 2021-09-18 – 2021-09-19 (×7): 7 [IU] via SUBCUTANEOUS
  Administered 2021-09-20: 3 [IU] via SUBCUTANEOUS
  Administered 2021-09-20: 4 [IU] via SUBCUTANEOUS
  Administered 2021-09-20 (×3): 3 [IU] via SUBCUTANEOUS
  Administered 2021-09-21 (×3): 4 [IU] via SUBCUTANEOUS
  Administered 2021-09-21 (×3): 3 [IU] via SUBCUTANEOUS
  Administered 2021-09-22 (×3): 4 [IU] via SUBCUTANEOUS
  Administered 2021-09-23: 7 [IU] via SUBCUTANEOUS
  Administered 2021-09-23 (×2): 4 [IU] via SUBCUTANEOUS
  Administered 2021-09-23: 7 [IU] via SUBCUTANEOUS
  Administered 2021-09-23: 3 [IU] via SUBCUTANEOUS
  Administered 2021-09-23 – 2021-09-24 (×2): 4 [IU] via SUBCUTANEOUS
  Administered 2021-09-24 (×2): 7 [IU] via SUBCUTANEOUS
  Administered 2021-09-24 (×2): 4 [IU] via SUBCUTANEOUS
  Administered 2021-09-24: 7 [IU] via SUBCUTANEOUS
  Administered 2021-09-25: 4 [IU] via SUBCUTANEOUS
  Administered 2021-09-25 (×3): 3 [IU] via SUBCUTANEOUS
  Administered 2021-09-26 – 2021-09-27 (×6): 4 [IU] via SUBCUTANEOUS
  Administered 2021-09-27: 3 [IU] via SUBCUTANEOUS
  Administered 2021-09-27: 4 [IU] via SUBCUTANEOUS
  Administered 2021-09-27 – 2021-09-28 (×5): 3 [IU] via SUBCUTANEOUS
  Administered 2021-09-28: 4 [IU] via SUBCUTANEOUS
  Administered 2021-09-28 (×2): 3 [IU] via SUBCUTANEOUS
  Administered 2021-09-29 (×2): 4 [IU] via SUBCUTANEOUS
  Administered 2021-09-29: 3 [IU] via SUBCUTANEOUS
  Administered 2021-09-30 (×4): 4 [IU] via SUBCUTANEOUS
  Administered 2021-09-30 – 2021-10-01 (×2): 7 [IU] via SUBCUTANEOUS
  Administered 2021-10-01 (×2): 11 [IU] via SUBCUTANEOUS
  Administered 2021-10-01: 7 [IU] via SUBCUTANEOUS
  Administered 2021-10-01: 11 [IU] via SUBCUTANEOUS
  Administered 2021-10-01: 7 [IU] via SUBCUTANEOUS
  Administered 2021-10-02: 11 [IU] via SUBCUTANEOUS
  Administered 2021-10-02: 4 [IU] via SUBCUTANEOUS
  Administered 2021-10-02: 3 [IU] via SUBCUTANEOUS
  Administered 2021-10-02: 7 [IU] via SUBCUTANEOUS
  Administered 2021-10-02: 3 [IU] via SUBCUTANEOUS
  Administered 2021-10-03: 11 [IU] via SUBCUTANEOUS
  Administered 2021-10-03: 3 [IU] via SUBCUTANEOUS
  Administered 2021-10-03: 7 [IU] via SUBCUTANEOUS
  Administered 2021-10-03: 4 [IU] via SUBCUTANEOUS
  Administered 2021-10-03 – 2021-10-04 (×3): 11 [IU] via SUBCUTANEOUS
  Administered 2021-10-04: 20 [IU] via SUBCUTANEOUS
  Administered 2021-10-04 (×4): 11 [IU] via SUBCUTANEOUS
  Administered 2021-10-04: 4 [IU] via SUBCUTANEOUS
  Administered 2021-10-05 (×3): 7 [IU] via SUBCUTANEOUS
  Administered 2021-10-05 (×2): 11 [IU] via SUBCUTANEOUS
  Administered 2021-10-06: 7 [IU] via SUBCUTANEOUS
  Administered 2021-10-06: 11 [IU] via SUBCUTANEOUS
  Administered 2021-10-06: 4 [IU] via SUBCUTANEOUS
  Administered 2021-10-06 (×2): 11 [IU] via SUBCUTANEOUS
  Administered 2021-10-06 – 2021-10-07 (×3): 7 [IU] via SUBCUTANEOUS
  Administered 2021-10-07 (×2): 11 [IU] via SUBCUTANEOUS

## 2021-09-12 NOTE — Procedures (Signed)
INTERVENTIONAL NEURORADIOLOGY BRIEF POSTPROCEDURE NOTE ? ?DIAGNOSTIC CEREBRAL ANGIOGRAM AND MECHANICAL THROMBECTOMY ? ?Attending: Dr. Pedro Earls ? ?Assistant: None. ? ?Diagnosis: Right M2/MCA occlusion. ? ?Access site: RCFA ? ?Access closure: Perclose prostyle ? ?Anesthesia: GETA ? ?Medication used: Refer to anesthesia documentation. ? ?Complications: None. ? ?Estimated blood loss: ~50 mL. ? ?Specimen: None. ? ?Findings: Occlusion of the right M2/MCA posterior division branch. Complete recanalization obtained with one direct contact aspiration pass (TICI3). No hemorrhagic complication on flat panel CT. ? ?The patient tolerated the procedure well without incident or complication and is in stable condition.  ? ? PLAN: ?- Bed rest x6 hours ?- SBP 120-140 mmHg ?- transfer to ICU ?

## 2021-09-12 NOTE — Anesthesia Procedure Notes (Signed)
Procedure Name: Intubation ?Date/Time: 09/12/2021 7:01 AM ?Performed by: Trinna Post., CRNA ?Pre-anesthesia Checklist: Patient identified, Emergency Drugs available, Suction available, Patient being monitored and Timeout performed ?Patient Re-evaluated:Patient Re-evaluated prior to induction ?Oxygen Delivery Method: Circle system utilized ?Preoxygenation: Pre-oxygenation with 100% oxygen ?Induction Type: IV induction, Rapid sequence and Cricoid Pressure applied ?Laryngoscope Size: Mac and 4 ?Grade View: Grade III ?Tube type: Oral ?Tube size: 7.5 mm ?Number of attempts: 2 ?Airway Equipment and Method: Stylet ?Placement Confirmation: ETT inserted through vocal cords under direct vision, positive ETCO2 and breath sounds checked- equal and bilateral ?Secured at: 23 cm ?Tube secured with: Tape ?Dental Injury: Teeth and Oropharynx as per pre-operative assessment  ?Difficulty Due To: Difficult Airway- due to anterior larynx ?Comments: DVL x2 with view only of arytenoid base. Tube successfully passed on 2nd try with MAC 4. No adverse events, VSS throughout ? ? ? ? ?

## 2021-09-12 NOTE — Progress Notes (Addendum)
Called ED at [phone] to get report on pt.  Report received and ED RN said she would bring pt up shortly.  Got a call shortly after saying there was a neuro change and she was taking pt to CT.  ED RN requested this RN to meet her in MRI (after CT) and take over care of pt.  This RN went to MRI at 0500 and assessed pt.  Pt had slight dysarthria, left sided facial droop, and slight LUE weakness.  MRI completed and pt taken to 4N28 at [phone].  At [place], this RN paged Dr. Leonel Ramsay due to worsening neuro status.  Pt started to neglect left side, have a right gaze preference, sensory and vision loss, and progressive left sided weakness.  Dr. Leonel Ramsay to bedside at [phone] to assess pt.  Pt taken down to IR at 0645.  Report given to IR team.     ?

## 2021-09-12 NOTE — ED Notes (Signed)
Per provider hold plavix and ASA and administer labetalol 10mg  per Dr. Leonel Ramsay ?

## 2021-09-12 NOTE — TOC CAGE-AID Note (Signed)
Transition of Care (TOC) - CAGE-AID Screening ? ? ?Patient Details  ?Name: Jacob [name] ?MRN: [id number] ?Date of Birth: 07-21-1975 ? ?Transition of Care (TOC) CM/SW Contact:    ?Elizardo Chilson C Tarpley-Carter, [name] ?Phone Number: ?09/12/2021, 12:39 PM ? ? ?Clinical Narrative: ?Pt is unable to participate in Cage Aid. ?Pt is currently absent from room.  CSW will attempt to assess at a better time. ? ?Passenger transport manager, MSW, LCSW-A ?Pronouns:  She/Her/Hers ?[facility]Transitions of Care ?Clinical Social Worker ?Direct Number:  [phone] ?[email or web address] ? ?CAGE-AID Screening: ?Substance Abuse Screening unable to be completed due to: : Patient unable to participate ? ?  ?  ?  ?  ?  ? ?  ? ?  ? ? ? ? ? ? ?

## 2021-09-12 NOTE — ED Notes (Signed)
Brooke from [employer] arrived and assumed care of pt at this time ?

## 2021-09-12 NOTE — Progress Notes (Signed)
OT Cancellation Note ? ?Patient Details ?Name: OMA SKUFCA ?MRN: [id number] ?DOB: 01/15/1976 ? ? ?Cancelled Treatment:    Reason Eval/Treat Not Completed: Active bedrest order.  Active bedrest order; noted s/p TNK and recanalization procedure in IR early this am.  OT will follow up when activity orders upgraded. ? ?Gaspard Isbell D Tabbetha Kutscher ?09/12/2021, 9:50 AM ?

## 2021-09-12 NOTE — Anesthesia Preprocedure Evaluation (Signed)
Anesthesia Evaluation  ?Patient identified by MRN, date of birth, ID band ?Patient awake ? ? ? ?Reviewed: ?Allergy & Precautions, H&P , NPO status , Patient's Chart, lab work & pertinent test results ? ?Airway ?Mallampati: II ? ? ?Neck ROM: full ? ? ? Dental ?  ?Pulmonary ? ?  ?breath sounds clear to auscultation ? ? ? ? ? ? Cardiovascular ?hypertension,  ?Rhythm:regular Rate:Normal ? ? ?  ?Neuro/Psych ?Code stroke. Pt has left sided weakness. ?CVA, Residual Symptoms   ? GI/Hepatic ?  ?Endo/Other  ?diabetes, Type 2 ? Renal/GU ?  ? ?  ?Musculoskeletal ? ? Abdominal ?  ?Peds ? Hematology ?  ?Anesthesia Other Findings ? ? Reproductive/Obstetrics ? ?  ? ? ? ? ? ? ? ? ? ? ? ? ? ?  ?  ? ? ? ? ? ? ? ? ?Anesthesia Physical ?Anesthesia Plan ? ?ASA: 3 and emergent ? ?Anesthesia Plan: General  ? ?Post-op Pain Management:   ? ?Induction: Intravenous ? ?PONV Risk Score and Plan: 2 and Ondansetron, Dexamethasone and Treatment may vary due to age or medical condition ? ?Airway Management Planned: Oral ETT ? ?Additional Equipment:  ? ?Intra-op Plan:  ? ?Post-operative Plan: Extubation in OR ? ?Informed Consent: I have reviewed the patients History and Physical, chart, labs and discussed the procedure including the risks, benefits and alternatives for the proposed anesthesia with the patient or authorized representative who has indicated his/her understanding and acceptance.  ? ? ? ?Dental advisory given ? ?Plan Discussed with: CRNA, Anesthesiologist and Surgeon ? ?Anesthesia Plan Comments:   ? ? ? ? ? ? ?Anesthesia Quick Evaluation ? ?

## 2021-09-12 NOTE — Progress Notes (Signed)
RN informed MD about patient's CBG being above 250 since admission. Insulin scale changed and DM coordinator consulted for further evaluation and management per MD.  ?

## 2021-09-12 NOTE — Anesthesia Postprocedure Evaluation (Signed)
Anesthesia Post Note ? ?Patient: Jacob [name] ? ?Procedure(s) Performed: IR WITH ANESTHESIA ? ?  ? ?Patient location during evaluation: PACU ?Anesthesia Type: General ?Level of consciousness: awake and alert ?Pain management: pain level controlled ?Vital Signs Assessment: post-procedure vital signs reviewed and stable ?Respiratory status: spontaneous breathing, nonlabored ventilation, respiratory function stable and patient connected to nasal cannula oxygen ?Cardiovascular status: blood pressure returned to baseline and stable ?Postop Assessment: no apparent nausea or vomiting ?Anesthetic complications: no ? ? ?No notable events documented. ? ?Last Vitals:  ?Vitals:  ? 09/12/21 0842 09/12/21 0845  ?BP: (!) 149/92 (!) 122/110  ?Pulse: 87 86  ?Resp: 18 17  ?Temp:    ?SpO2: 100% 100%  ?  ?Last Pain:  ?Vitals:  ? 09/12/21 0833  ?TempSrc: Axillary  ?PainSc:   ? ? ?  ?  ?  ?  ?  ?  ? ?[place] ? ? ? ? ?

## 2021-09-12 NOTE — ED Notes (Signed)
At MRI 

## 2021-09-12 NOTE — Sedation Documentation (Signed)
Patient transported to [facility] ICU with [facility]. Madalyn Rob RN at the bedside to receive patient along with [facility] staff. Right groin site assessed. Clean, dry and intact. No drainage noted from site. Soft to palpation, no hematoma noted. +1 distal pulses bilaterally. Patient responds to voice, moving right side purposefully. No movement to left side.  ?

## 2021-09-12 NOTE — ED Notes (Signed)
Neuro back at bedside at this time advised we are going to go ahead with treatment ?

## 2021-09-12 NOTE — ED Notes (Signed)
Provider at bedside

## 2021-09-12 NOTE — ED Triage Notes (Signed)
BIB [facility] from home [place]. Pt was sitting up on side of bed couldn't feel that he was holding on to his phone in the lt hand. Tried to standup and felt funny, speech was slurred,. EMS was called with EMS pt has lt side facial droop and paralysis with speech slurred, sensation was intact. PIV 18ga Lt AC ?

## 2021-09-12 NOTE — ED Notes (Signed)
Ct completed pt transported to MRI at this time ?

## 2021-09-12 NOTE — Transfer of Care (Signed)
Immediate Anesthesia Transfer of Care Note ? ?Patient: Jacob [name] ? ?Procedure(s) Performed: IR WITH ANESTHESIA ? ?Patient Location: PACU and ICU ? ?Anesthesia Type:General ? ?Level of Consciousness: awake and drowsy ? ?Airway & Oxygen Therapy: Patient Spontanous Breathing and Patient connected to face mask oxygen ? ?Post-op Assessment: Report given to RN and Post -op Vital signs reviewed and stable ? ?Post vital signs: Reviewed and stable ? ?Last Vitals:  ?Vitals Value Taken Time  ?BP 132/111 09/12/21 0815  ?Temp    ?Pulse 84 09/12/21 0821  ?Resp 19 09/12/21 0821  ?SpO2 100 % 09/12/21 0821  ?Vitals shown include unvalidated device data. ? ?Last Pain:  ?Vitals:  ? 09/12/21 0611  ?TempSrc: Oral  ?PainSc:   ?   ? ?  ? ?Complications: No notable events documented. ?

## 2021-09-12 NOTE — ED Notes (Signed)
Noted drift in lt arm and weakness with hand grip provider made aware of same ?

## 2021-09-12 NOTE — Progress Notes (Addendum)
STROKE TEAM PROGRESS NOTE  ? ?INTERVAL HISTORY ?Seen lying lying in bed after thrombectomy. Partner at the bedside.  ?Decline in neuro status this morning and went for thrombectomy. TICI3 revascularization achieved.  ? ?Vitals:  ? 09/12/21 0611 09/12/21 0615 09/12/21 0630 09/12/21 0645  ?BP: (!) 163/106 (!) 163/106 (!) 144/84 (!) 161/103  ?Pulse: (!) 108 83 83 89  ?Resp: (!) 30 20 17  (!) 24  ?Temp: 97.9 ?F (36.6 ?C)     ?TempSrc: Oral     ?SpO2: 96% 92% 99% 98%  ?Weight: 124 kg     ?Height:      ? ?CBC:  ?Recent Labs  ?Lab 09/12/21 ?0300 09/12/21 ?0305  ?WBC 5.5  --   ?NEUTROABS 2.1  --   ?HGB 14.3 13.6  ?HCT 40.0 40.0  ?MCV 83.0  --   ?PLT 174  --   ? ?Basic Metabolic Panel:  ?Recent Labs  ?Lab 09/12/21 ?0300 09/12/21 ?0305  ?NA 137 139  ?K 3.5 3.6  ?CL 103 101  ?CO2 26  --   ?GLUCOSE 308* 308*  ?BUN 11 13  ?CREATININE 1.04 1.00  ?CALCIUM 9.0  --   ? ?Lipid Panel:  ?Recent Labs  ?Lab 09/12/21 ?0356  ?CHOL 191  ?TRIG 284*  ?HDL 38*  ?CHOLHDL 5.0  ?VLDL 57*  ?[place] Torres 96  ? ?HgbA1c:  ?Recent Labs  ?Lab 09/12/21 ?0300  ?HGBA1C 8.1*  ? ?Urine Drug Screen: No results for input(s): LABOPIA, COCAINSCRNUR, LABBENZ, AMPHETMU, THCU, LABBARB in the last 168 hours.  ?Alcohol Level No results for input(s): ETH in the last 168 hours. ? ?IMAGING past 24 hours ?CT HEAD WO CONTRAST (5MM) ? ?Result Date: 09/12/2021 ?CLINICAL DATA:  Stroke follow-up.  Left arm and leg drift. EXAM: CT HEAD WITHOUT CONTRAST TECHNIQUE: Contiguous axial images were obtained from the base of the skull through the vertex without intravenous contrast. RADIATION DOSE REDUCTION: This exam was performed according to the departmental dose-optimization program which includes automated exposure control, adjustment of the mA and/or kV according to patient size and/or use of iterative reconstruction technique. COMPARISON:  Head CT and CTA from earlier today FINDINGS: Brain: No evidence of acute infarction, hemorrhage, hydrocephalus, extra-axial collection or mass  lesion/mass effect. Vascular: No hyperdense vessel or unexpected calcification. Skull: Normal. Negative for fracture or focal lesion. Sinuses/Orbits: No acute finding. IMPRESSION: No hemorrhage or detected infarct. Electronically Signed   By: Jorje Guild M.D.   On: 09/12/2021 05:02  ? ?MR BRAIN WO CONTRAST ? ?Result Date: 09/12/2021 ?CLINICAL DATA:  Stroke follow-up.  Left facial droop. EXAM: MRI HEAD WITHOUT CONTRAST TECHNIQUE: Multiplanar, multiecho pulse sequences of the brain and surrounding structures were obtained without intravenous contrast. COMPARISON:  Head CT from earlier today FINDINGS: Brain: Small areas of restricted diffusion along the right upper frontal cortex and to a lesser extent in the subjacent white matter. No acute hemorrhage, hydrocephalus, mass, or collection. No pre-existing infarct or brain atrophy. Accentuated intravascular gradient hypointensity along the right cerebral hemisphere. Vascular: Normal flow voids Skull and upper cervical spine: Normal marrow signal Sinuses/Orbits: Mild mucosal thickening in the paranasal sinuses. IMPRESSION: 1. Tiny acute infarcts in the upper division right MCA cortex. 2. Accentuated right cerebral cortical vessels on gradient imaging, likely slow flow or compensating cortical collaterals. This may indicate that the right MCA stenosis on admission CTA may persist. Electronically Signed   By: Jorje Guild M.D.   On: 09/12/2021 06:04  ? ?CT HEAD CODE STROKE WO CONTRAST ? ?Result Date: 09/12/2021 ?CLINICAL  DATA:  Left-sided deficits EXAM: CT HEAD WITHOUT CONTRAST CT ANGIOGRAPHY OF THE HEAD AND NECK TECHNIQUE: Contiguous axial images were obtained from the base of the skull through the vertex without intravenous contrast. Multidetector CT imaging of the head and neck was performed using the standard protocol during bolus administration of intravenous contrast. Multiplanar CT image reconstructions and MIPs were obtained to evaluate the vascular anatomy.  Carotid stenosis measurements (when applicable) are obtained utilizing NASCET criteria, using the distal internal carotid diameter as the denominator. RADIATION DOSE REDUCTION: This exam was performed according to the departmental dose-optimization program which includes automated exposure control, adjustment of the mA and/or kV according to patient size and/or use of iterative reconstruction technique. CONTRAST:  60mL OMNIPAQUE IOHEXOL 350 MG/ML SOLN COMPARISON:  None. FINDINGS: CT HEAD FINDINGS Brain: There is no mass, hemorrhage or extra-axial collection. The size and configuration of the ventricles and extra-axial CSF spaces are normal. The brain parenchyma is normal, without evidence of acute or chronic infarction. Vascular: No abnormal hyperdensity of the major intracranial arteries or dural venous sinuses. No intracranial atherosclerosis. Skull: The visualized skull base, calvarium and extracranial soft tissues are normal. Sinuses/Orbits: No fluid levels or advanced mucosal thickening of the visualized paranasal sinuses. No mastoid or middle ear effusion. The orbits are normal. ASPECTS ([place] Stroke Program Early CT Score) - Ganglionic level infarction (caudate, lentiform nuclei, internal capsule, insula, M1-M3 cortex): 7 - Supraganglionic infarction (M4-M6 cortex): 3 Total score (0-10 with 10 being normal): 10 CTA NECK FINDINGS SKELETON: There is no bony spinal canal stenosis. No lytic or blastic lesion. OTHER NECK: Normal pharynx, larynx and major salivary glands. No cervical lymphadenopathy. Unremarkable thyroid gland. UPPER CHEST: No pneumothorax or pleural effusion. No nodules or masses. AORTIC ARCH: There is no calcific atherosclerosis of the aortic arch. There is no aneurysm, dissection or hemodynamically significant stenosis of the visualized portion of the aorta. Conventional 3 vessel aortic branching pattern. The visualized proximal subclavian arteries are widely patent. RIGHT CAROTID SYSTEM:  Normal without aneurysm, dissection or stenosis. LEFT CAROTID SYSTEM: Normal without aneurysm, dissection or stenosis. VERTEBRAL ARTERIES: Left dominant configuration. Both origins are clearly patent. There is no dissection, occlusion or flow-limiting stenosis to the skull base (V1-V3 segments). CTA HEAD FINDINGS POSTERIOR CIRCULATION: --Vertebral arteries: Normal V4 segments. --Inferior cerebellar arteries: Normal. --Basilar artery: Normal. --Superior cerebellar arteries: Normal. --Posterior cerebral arteries (PCA): Normal. ANTERIOR CIRCULATION: --Intracranial internal carotid arteries: Normal. --Anterior cerebral arteries (ACA): Normal. Both A1 segments are present. Patent anterior communicating artery (a-comm). --Middle cerebral arteries (MCA): There is a short segment occlusion of the distal right M1 segment measuring 2 mm. There is multifocal atherosclerotic narrowing of the right M2 segments. Severe stenosis of the distal left M1 segment. VENOUS SINUSES: As permitted by contrast timing, patent. ANATOMIC VARIANTS: None Review of the MIP images confirms the above findings. IMPRESSION: 1. Short segment occlusion of the distal right M1 segment. 2. Severe stenosis of the distal left M1 segment. 3. ASPECTS is 10. Critical Value/emergent results were called by telephone at the time of interpretation on 09/12/2021 at 3:28 am to provider [facility] , who verbally acknowledged these results. Electronically Signed   By: Ulyses Jarred M.D.   On: 09/12/2021 03:28  ? ?CT ANGIO HEAD NECK W WO CM (CODE STROKE) ? ?Result Date: 09/12/2021 ?CLINICAL DATA:  Left-sided deficits EXAM: CT HEAD WITHOUT CONTRAST CT ANGIOGRAPHY OF THE HEAD AND NECK TECHNIQUE: Contiguous axial images were obtained from the base of the skull through the vertex without intravenous  contrast. Multidetector CT imaging of the head and neck was performed using the standard protocol during bolus administration of intravenous contrast. Multiplanar CT  image reconstructions and MIPs were obtained to evaluate the vascular anatomy. Carotid stenosis measurements (when applicable) are obtained utilizing NASCET criteria, using the distal internal carotid diameter as

## 2021-09-12 NOTE — Progress Notes (Signed)
PT Cancellation Note ? ?Patient Details ?Name: Jacob [name] ?MRN: [name] ?DOB: 30-Sep-1975 ? ? ?Cancelled Treatment:    Reason Eval/Treat Not Completed: Active bedrest order; noted s/p TNK and recanalization procedure in IR early this am.  Will follow up when activity orders upgraded. ? ? ?Reginia Naas ?09/12/2021, 9:06 AM ?Magda Kiel, PT ?Acute Rehabilitation Services ?[id number] ?[phone] ?09/12/2021 ? ?

## 2021-09-12 NOTE — ED Provider Notes (Signed)
?[facility] ?Provider Note ? ? ?CSN: [id number] ?Arrival date & time: 09/12/21  0254 ? ?An emergency department physician performed an initial assessment on this suspected stroke patient at 34. ? ?History ? ?Chief Complaint  ?Patient presents with  ? Code Stroke  ? ? ?Jacob [name] is a 46 y.o. male. ? ?The history is provided by the patient, the EMS personnel and medical records.  ?Jacob [name] is a 46 y.o. male who presents to the Emergency Department complaining of code stroke.  He presents to the emergency department by EMS as a code stroke.  He states that he was in his routine state of health when 45 minutes prior to ED arrival he developed left-sided facial weakness, difficulty speaking and could not feel his phone in his left hand.  He was feeling well prior to this occurring. ?  ? ?Home Medications ?Prior to Admission medications   ?Medication Sig Start Date End Date Taking? Authorizing Provider  ?albuterol (VENTOLIN HFA) 108 (90 Base) MCG/ACT inhaler Inhale 2 puffs into the lungs every 6 (six) hours as needed for wheezing or shortness of breath. 06/21/19   Charlott Rakes, MD  ?amLODipine (NORVASC) 10 MG tablet Take 1 tablet (10 mg total) by mouth daily. 04/08/21   Charlott Rakes, MD  ?atorvastatin (LIPITOR) 20 MG tablet Take 1 tablet (20 mg total) by mouth daily. 04/08/21   Charlott Rakes, MD  ?Blood Glucose Monitoring Suppl (TRUE METRIX METER) w/Device KIT USE AS DIRECTED 12/04/20   Charlott Rakes, MD  ?clotrimazole (LOTRIMIN) 1 % cream Apply 1 application topically 2 (two) times daily. 02/23/18   Charlott Rakes, MD  ?glipiZIDE (GLUCOTROL) 10 MG tablet Take 1 tablet (10 mg total) by mouth 2 (two) times daily before a meal. 04/08/21   Charlott Rakes, MD  ?glucose blood (TRUE METRIX BLOOD GLUCOSE TEST) test strip Use three times daily 10/31/20   Mayers, Cari S, PA-C  ?lisinopril (ZESTRIL) 10 MG tablet Take 1 tablet (10 mg total) by mouth daily. 09/10/21   Charlott Rakes, MD  ?metFORMIN (GLUCOPHAGE) 500 MG tablet Take 2 tablets (1,000 mg total) by mouth 2 (two) times daily with a meal. 01/06/21   Charlott Rakes, MD  ?TRUEplus Lancets 28G MISC use 3 (three) times daily before meals. 10/31/20   Mayers, Loraine Grip, PA-C  ?Vitamin D, Ergocalciferol, (DRISDOL) 1.25 MG (50000 UNIT) CAPS capsule Take 1 capsule (50,000 Units total) by mouth every 7 (seven) days. 11/04/20   Mayers, Loraine Grip, PA-C  ?   ? ?Allergies    ?Hydrochlorothiazide   ? ?Review of Systems   ?Review of Systems  ?All other systems reviewed and are negative. ? ?Physical Exam ?Updated Vital Signs ?BP (!) 177/106   Pulse 85   Temp 98.3 ?F (36.8 ?C)   Resp 20   Ht 6' 1"  (1.854 m)   Wt 107 kg   SpO2 97%   BMI 31.14 kg/m?  ?Physical Exam ?Vitals and nursing note reviewed.  ?Constitutional:   ?   Appearance: He is well-developed.  ?HENT:  ?   Head: Normocephalic and atraumatic.  ?Cardiovascular:  ?   Rate and Rhythm: Normal rate and regular rhythm.  ?   Heart sounds: No murmur heard. ?Pulmonary:  ?   Effort: Pulmonary effort is normal. No respiratory distress.  ?   Breath sounds: Normal breath sounds.  ?Abdominal:  ?   Palpations: Abdomen is soft.  ?   Tenderness: There is no abdominal tenderness. There is  no guarding or rebound.  ?Musculoskeletal:     ?   General: No tenderness.  ?Skin: ?   General: Skin is warm and dry.  ?Neurological:  ?   Mental Status: He is alert and oriented to person, place, and time.  ?   Comments: Mild left lower facial droop.  Mild dysarthria.  5 out of 5 strength in all 4 extremities.  Mild ataxia on finger-to-nose with the left upper extremity.  ?Psychiatric:     ?   Behavior: Behavior normal.  ? ? ?ED Results / Procedures / Treatments   ?Labs ?(all labs ordered are listed, but only abnormal results are displayed) ?Labs Reviewed  ?COMPREHENSIVE METABOLIC PANEL - Abnormal; Notable for the following components:  ?    Result Value  ? Glucose, Bld 308 (*)   ? All other components within normal  limits  ?LIPID PANEL - Abnormal; Notable for the following components:  ? Triglycerides 284 (*)   ? HDL 38 (*)   ? VLDL 57 (*)   ? All other components within normal limits  ?HEMOGLOBIN A1C - Abnormal; Notable for the following components:  ? Hgb A1c MFr Bld 8.1 (*)   ? All other components within normal limits  ?I-STAT CHEM 8, ED - Abnormal; Notable for the following components:  ? Glucose, Bld 308 (*)   ? All other components within normal limits  ?CBG MONITORING, ED - Abnormal; Notable for the following components:  ? Glucose-Capillary 291 (*)   ? All other components within normal limits  ?CBG MONITORING, ED - Abnormal; Notable for the following components:  ? Glucose-Capillary 285 (*)   ? All other components within normal limits  ?PROTIME-INR  ?APTT  ?CBC  ?DIFFERENTIAL  ? ? ?EKG ?None ? ?Radiology ?CT HEAD CODE STROKE WO CONTRAST ? ?Result Date: 09/12/2021 ?CLINICAL DATA:  Left-sided deficits EXAM: CT HEAD WITHOUT CONTRAST CT ANGIOGRAPHY OF THE HEAD AND NECK TECHNIQUE: Contiguous axial images were obtained from the base of the skull through the vertex without intravenous contrast. Multidetector CT imaging of the head and neck was performed using the standard protocol during bolus administration of intravenous contrast. Multiplanar CT image reconstructions and MIPs were obtained to evaluate the vascular anatomy. Carotid stenosis measurements (when applicable) are obtained utilizing NASCET criteria, using the distal internal carotid diameter as the denominator. RADIATION DOSE REDUCTION: This exam was performed according to the departmental dose-optimization program which includes automated exposure control, adjustment of the mA and/or kV according to patient size and/or use of iterative reconstruction technique. CONTRAST:  36m OMNIPAQUE IOHEXOL 350 MG/ML SOLN COMPARISON:  None. FINDINGS: CT HEAD FINDINGS Brain: There is no mass, hemorrhage or extra-axial collection. The size and configuration of the ventricles  and extra-axial CSF spaces are normal. The brain parenchyma is normal, without evidence of acute or chronic infarction. Vascular: No abnormal hyperdensity of the major intracranial arteries or dural venous sinuses. No intracranial atherosclerosis. Skull: The visualized skull base, calvarium and extracranial soft tissues are normal. Sinuses/Orbits: No fluid levels or advanced mucosal thickening of the visualized paranasal sinuses. No mastoid or middle ear effusion. The orbits are normal. ASPECTS ([place] Program Early CT Score) - Ganglionic level infarction (caudate, lentiform nuclei, internal capsule, insula, M1-M3 cortex): 7 - Supraganglionic infarction (M4-M6 cortex): 3 Total score (0-10 with 10 being normal): 10 CTA NECK FINDINGS SKELETON: There is no bony spinal canal stenosis. No lytic or blastic lesion. OTHER NECK: Normal pharynx, larynx and major salivary glands. No cervical lymphadenopathy. Unremarkable thyroid  gland. UPPER CHEST: No pneumothorax or pleural effusion. No nodules or masses. AORTIC ARCH: There is no calcific atherosclerosis of the aortic arch. There is no aneurysm, dissection or hemodynamically significant stenosis of the visualized portion of the aorta. Conventional 3 vessel aortic branching pattern. The visualized proximal subclavian arteries are widely patent. RIGHT CAROTID SYSTEM: Normal without aneurysm, dissection or stenosis. LEFT CAROTID SYSTEM: Normal without aneurysm, dissection or stenosis. VERTEBRAL ARTERIES: Left dominant configuration. Both origins are clearly patent. There is no dissection, occlusion or flow-limiting stenosis to the skull base (V1-V3 segments). CTA HEAD FINDINGS POSTERIOR CIRCULATION: --Vertebral arteries: Normal V4 segments. --Inferior cerebellar arteries: Normal. --Basilar artery: Normal. --Superior cerebellar arteries: Normal. --Posterior cerebral arteries (PCA): Normal. ANTERIOR CIRCULATION: --Intracranial internal carotid arteries: Normal. --Anterior  cerebral arteries (ACA): Normal. Both A1 segments are present. Patent anterior communicating artery (a-comm). --Middle cerebral arteries (MCA): There is a short segment occlusion of the distal right M1 segment m

## 2021-09-12 NOTE — Progress Notes (Signed)
PHARMACIST CODE STROKE RESPONSE ? ?Notified to mix TNK at 0332 by Dr. Leonel Ramsay ?Delivered TNK to RN at 0335 ? ?TNK dose = 25 mg IV over 5 seconds.  ? ?Issues/delays encountered (if applicable):  ?Small delay due to elevated BP ? ?[name] ?09/12/21 3:55 AM ?

## 2021-09-12 NOTE — Progress Notes (Signed)
Interventional Radiology Brief Note: ? ?Patient assessed at bedside alongside Dr. Karenann Cai.  ?He is alert and oriented. Continued left-sided neglect.  No sensation to left-side.  Flickers of movement with left arm, RN reports slight withdrawal to pain with left foot however both minimal and only spontaneous, non-purposeful.  ? ?Groin site assessed.  Intact.  No evidence of hematoma or pseudoaneurysm. ? ?Remains on cleviprex for BP control.  ? ?NIR following.  ? ?Brynda Greathouse, MS RD PA-C ?3:16 PM ? ? ?

## 2021-09-12 NOTE — ED Notes (Signed)
Pt transported to ct at this time 

## 2021-09-12 NOTE — Progress Notes (Signed)
?  Transition of Care (TOC) Screening Note ? ? ?Patient Details  ?Name: Jacob [name] ?Date of Birth: February 20, 1976 ? ? ?Transition of Care (TOC) CM/SW Contact:    ?Benard Halsted, LCSW ?Phone Number: ?09/12/2021, 4:15 PM ? ? ? ?Transition of Care Department [facility]) has reviewed patient and no TOC needs have been identified at this time. We will continue to monitor patient advancement through interdisciplinary progression rounds. If new patient transition needs arise, please place a TOC consult. ? ? ?

## 2021-09-12 NOTE — Progress Notes (Signed)
Pt's belongings taken home with wife.   ?

## 2021-09-12 NOTE — Progress Notes (Signed)
Earlier, he had a transient mild worsening to NIH 5, prompting a CT to look for hemorrhage, which was negative but then had improvement. Over the past hour or so that he was in MRI, he has had marked decline.  ? ?NIHSS score: 13 ?1A: Level of Consciousness - 0 ?1B: Ask Month and Age - 0 ?1C: 'Blink Eyes' & 'Squeeze Hands' - 0 ?2: Test Horizontal Extraocular Movements - 1 ?3: Test Visual Fields - 2 ?4: Test Facial Palsy - 2 ?5A: Test Left Arm Motor Drift - 2 ?5B: Test Right Arm Motor Drift - 0 ?6A: Test Left Leg Motor Drift - 1 ?6B: Test Right Leg Motor Drift - 0 ?7: Test Limb Ataxia - 0 ?8: Test Sensation - 2 ?9: Test Language/Aphasia- 0 ?10: Test Dysarthria - 1 ?11: Test Extinction/Inattention - 2 ? ?Given this worsening, and the clinical/DWI mismatch seen, he was felt to be a thrombectomy candidate and after discussion with Dr. Karenann Cai, decision was made to proceed with thrombectomy.  ? ?This patient is critically ill and at significant risk of neurological worsening, death and care requires constant monitoring of vital signs, hemodynamics,respiratory and cardiac monitoring, neurological assessment, discussion with family, other specialists and medical decision making of high complexity. I spent an additional 35 minutes of neurocritical care time  in the care of  this patient. This was time spent independent of any time provided by nurse practitioner or PA. ? ?Roland Rack, MD ?[facility] ?[phone] ? ?If 7pm- 7am, please page neurology on call as listed in [place]. ?09/12/2021  6:52 AM ? ?

## 2021-09-12 NOTE — H&P (Signed)
Neurology H&P ? ?CC: Left facial droop ? ?History is obtained from:Patient ? ?HPI: Jacob [name] is a 46 y.o. male with a history of htn and DM who presents with left sided facial droop. Initially he had arm symptoms as well. He was normal at 1:30 am, then started having symptoms and woke his wife up. He describes the symptoms as being that he was holding his phone, then suddenly could tnot tell he was holding his phone any more. He woke his wife who called 911. EMS noted some arm weakness and severe left facial droop.  He was brought in as a code stroke, and had significant improvement en route.  ? ?On first examination, his arm symptoms had almost completely resolved(mild slowness on sequential opposition of digits, but no drift or ataxia). He still had mild dysarthria and facial weakness, but nothing that appeared disabling. TNK was discussed, but given the mildness of symptoms was not pursued. Over the the next few minutes, it was noted that he had some fluctuation of his symptoms, from mild to severe to mild dysarthria. CTA showed R M1 occlusion. In the setting of fluctuating symptoms which were disabling at times, TNK was re-discussed and decision was made to pursue this. There was delay after this due to elevated blood pressures.  ? ? ?LKW: 1:30 am  ?tnk given?: yes ?IR Thrombectomy? No, mild symptoms.  ?Modified Rankin Scale: 0-Completely asymptomatic and back to baseline post- stroke ?NIHSS: 2 ? ? ?ROS: A complete ROS was performed and is negative except as noted in the HPI.  ?Past Medical History:  ?Diagnosis Date  ? Allergy   ? Diabetes mellitus without complication ([place])   ? Hypertension   ? ? ? ?Family History  ?Problem Relation Age of Onset  ? Asthma Son   ? ? ? ?Social History:  reports that he has never smoked. He has never used smokeless tobacco. He reports current alcohol use. He reports that he does not use drugs. ? ? ?Prior to Admission medications   ?Medication Sig Start Date End Date Taking?  Authorizing Provider  ?albuterol (VENTOLIN HFA) 108 (90 Base) MCG/ACT inhaler Inhale 2 puffs into the lungs every 6 (six) hours as needed for wheezing or shortness of breath. 06/21/19   Charlott Rakes, MD  ?amLODipine (NORVASC) 10 MG tablet Take 1 tablet (10 mg total) by mouth daily. 04/08/21   Charlott Rakes, MD  ?atorvastatin (LIPITOR) 20 MG tablet Take 1 tablet (20 mg total) by mouth daily. 04/08/21   Charlott Rakes, MD  ?Blood Glucose Monitoring Suppl (TRUE METRIX METER) w/Device KIT USE AS DIRECTED 12/04/20   Charlott Rakes, MD  ?clotrimazole (LOTRIMIN) 1 % cream Apply 1 application topically 2 (two) times daily. 02/23/18   Charlott Rakes, MD  ?glipiZIDE (GLUCOTROL) 10 MG tablet Take 1 tablet (10 mg total) by mouth 2 (two) times daily before a meal. 04/08/21   Charlott Rakes, MD  ?glucose blood (TRUE METRIX BLOOD GLUCOSE TEST) test strip Use three times daily 10/31/20   Mayers, Cari S, PA-C  ?lisinopril (ZESTRIL) 10 MG tablet Take 1 tablet (10 mg total) by mouth daily. 09/10/21   Charlott Rakes, MD  ?metFORMIN (GLUCOPHAGE) 500 MG tablet Take 2 tablets (1,000 mg total) by mouth 2 (two) times daily with a meal. 01/06/21   Charlott Rakes, MD  ?TRUEplus Lancets 28G MISC use 3 (three) times daily before meals. 10/31/20   Mayers, Loraine Grip, PA-C  ?Vitamin D, Ergocalciferol, (DRISDOL) 1.25 MG (50000 UNIT) CAPS capsule Take 1  capsule (50,000 Units total) by mouth every 7 (seven) days. 11/04/20   Mayers, Loraine Grip, PA-C  ? ? ? ?Exam: ?Current vital signs: ?BP (!) 179/109   Pulse 70   Temp 98.5 ?F (36.9 ?C) (Oral)   Resp 19   Ht 6' 1"  (1.854 m)   Wt 107 kg   SpO2 95%   BMI 31.14 kg/m?  ? ? ?Physical Exam  ?Constitutional: Appears well-developed and well-nourished.  ?Psych: Affect appropriate to situation ?Eyes: No scleral injection ?HENT: No OP obstrucion ?Head: Normocephalic.  ?Cardiovascular: Normal rate and regular rhythm.  ?Respiratory: Effort normal and breath sounds normal to anterior ascultation ?GI: Soft.  No  distension. There is no tenderness.  ?Skin: WDI ? ?Neuro: ?Mental Status: ?Patient is awake, alert, oriented to person, place, month, year, and situation. ?Patient is able to give a clear and coherent history. ?No signs of aphasia or neglect ?Cranial Nerves: ?II: Visual Fields are full. Pupils are equal, round, and reactive to light.   ?III,IV, VI: EOMI without ptosis or diploplia.  ?V: Facial sensation is symmetric to temperature ?VII: Facial movement with moderate left facial weakness ?VIII: hearing is intact to voice ?X: Uvula elevates symmetrically ?XI: Shoulder shrug is symmetric. ?XII: tongue is midline without atrophy or fasciculations.  ?Motor: ?Tone is normal. Bulk is normal. 5/5 strength was present in all four extremities. He has mild slowness to sequential digit opposition on the left hand, but is able to perform it.  ?Sensory: ?Sensation is symmetric to light touch and temperature in the arms and legs. No extinction.  ?Cerebellar: ?FNF and HKS are intact bilaterally ? ? ?I have reviewed labs in epic and the pertinent results are: ?Results for orders placed or performed during the hospital encounter of 09/12/21 (from the past 24 hour(s))  ?CBG monitoring, ED     Status: Abnormal  ? Collection Time: 09/12/21  2:56 AM  ?Result Value Ref Range  ? Glucose-Capillary 291 (H) 70 - 99 mg/dL  ?Protime-INR     Status: None  ? Collection Time: 09/12/21  3:00 AM  ?Result Value Ref Range  ? Prothrombin Time 12.8 11.4 - 15.2 seconds  ? INR 1.0 0.8 - 1.2  ?APTT     Status: None  ? Collection Time: 09/12/21  3:00 AM  ?Result Value Ref Range  ? aPTT 27 24 - 36 seconds  ?CBC     Status: None  ? Collection Time: 09/12/21  3:00 AM  ?Result Value Ref Range  ? WBC 5.5 4.0 - 10.5 K/uL  ? RBC 4.82 4.22 - 5.81 MIL/uL  ? Hemoglobin 14.3 13.0 - 17.0 g/dL  ? HCT 40.0 39.0 - 52.0 %  ? MCV 83.0 80.0 - 100.0 fL  ? MCH 29.7 26.0 - 34.0 pg  ? MCHC 35.8 30.0 - 36.0 g/dL  ? RDW 12.4 11.5 - 15.5 %  ? Platelets 174 150 - 400 K/uL  ? nRBC  0.0 0.0 - 0.2 %  ?Differential     Status: None  ? Collection Time: 09/12/21  3:00 AM  ?Result Value Ref Range  ? Neutrophils Relative % 39 %  ? Neutro Abs 2.1 1.7 - 7.7 K/uL  ? Lymphocytes Relative 47 %  ? Lymphs Abs 2.6 0.7 - 4.0 K/uL  ? Monocytes Relative 9 %  ? Monocytes Absolute 0.5 0.1 - 1.0 K/uL  ? Eosinophils Relative 4 %  ? Eosinophils Absolute 0.2 0.0 - 0.5 K/uL  ? Basophils Relative 1 %  ? Basophils Absolute 0.0  0.0 - 0.1 K/uL  ? Immature Granulocytes 0 %  ? Abs Immature Granulocytes 0.02 0.00 - 0.07 K/uL  ?Comprehensive metabolic panel     Status: Abnormal  ? Collection Time: 09/12/21  3:00 AM  ?Result Value Ref Range  ? Sodium 137 135 - 145 mmol/L  ? Potassium 3.5 3.5 - 5.1 mmol/L  ? Chloride 103 98 - 111 mmol/L  ? CO2 26 22 - 32 mmol/L  ? Glucose, Bld 308 (H) 70 - 99 mg/dL  ? BUN 11 6 - 20 mg/dL  ? Creatinine, Ser 1.04 0.61 - 1.24 mg/dL  ? Calcium 9.0 8.9 - 10.3 mg/dL  ? Total Protein 6.8 6.5 - 8.1 g/dL  ? Albumin 3.5 3.5 - 5.0 g/dL  ? AST 24 15 - 41 U/L  ? ALT 16 0 - 44 U/L  ? Alkaline Phosphatase 98 38 - 126 U/L  ? Total Bilirubin 0.8 0.3 - 1.2 mg/dL  ? GFR, Estimated >60 >60 mL/min  ? Anion gap 8 5 - 15  ?I-stat chem 8, ED     Status: Abnormal  ? Collection Time: 09/12/21  3:05 AM  ?Result Value Ref Range  ? Sodium 139 135 - 145 mmol/L  ? Potassium 3.6 3.5 - 5.1 mmol/L  ? Chloride 101 98 - 111 mmol/L  ? BUN 13 6 - 20 mg/dL  ? Creatinine, Ser 1.00 0.61 - 1.24 mg/dL  ? Glucose, Bld 308 (H) 70 - 99 mg/dL  ? Calcium, Ion 1.20 1.15 - 1.40 mmol/L  ? TCO2 30 22 - 32 mmol/L  ? Hemoglobin 13.6 13.0 - 17.0 g/dL  ? HCT 40.0 39.0 - 52.0 %  ? ? ? ?I have reviewed the images obtained:CT head - negative ?CTA - R short segment M1 occlusion ? ?Primary Diagnosis:  ?Cerebral infarction due to occlusion or stenosis of right middle cerebral artery.  ? ?Secondary Diagnosis: ?Essential (primary) hypertension, Hypertension Emergency (SBP > 180 or DBP > 120 & end organ damage), Type 2 diabetes mellitus with hyperglycemia ,  and Obesity ? ? ?Impression: 46 yo M with R M1 occlusion. Given his other areas of atherosclerotic change, and mild symptoms, my suspicion is for large vessel atherosclerotic disease. He is getting IV tenecteplase.

## 2021-09-12 NOTE — Evaluation (Signed)
Clinical/Bedside Swallow Evaluation ?Patient Details  ?Name: Jacob [name] ?MRN: [name] ?Date of Birth: 1975/06/03 ? ?Today's Date: 09/12/2021 ?Time: SLP Start Time (ACUTE ONLY): [id number] SLP Stop Time (ACUTE ONLY): [id number] ?SLP Time Calculation (min) (ACUTE ONLY): 14 min ? ?Past Medical History:  ?Past Medical History:  ?Diagnosis Date  ? Allergy   ? Diabetes mellitus without complication ([place])   ? Hypertension   ? ?Past Surgical History:  ?Past Surgical History:  ?Procedure Laterality Date  ? APPENDECTOMY    ? IR CT HEAD LTD  09/12/2021  ? IR PERCUTANEOUS ART THROMBECTOMY/INFUSION INTRACRANIAL INC DIAG ANGIO  09/12/2021  ? IR US GUIDE VASC ACCESS RIGHT  09/12/2021  ? LAPAROSCOPIC APPENDECTOMY  10/24/2013  ? Procedure: APPENDECTOMY LAPAROSCOPIC;  Surgeon: Shann Medal, MD;  Location: [facility] ORS;  Service: General;;  ? ?HPI:  ?Pt is a 46 y.o. male with who presented with left sided facial droop. TNK given. MRI brain 4/21: Tiny acute infarcts in the upper division right MCA cortex. Worsening neuro status noted after MRI with left-sided neglect, right gaze preference, sensory and vision loss, and progressive left sided weakness. Pt s/p  thrombectomy and revascularization 4/21. Pt passed [facility] on 4/21, but SLP was consulted subsequently for swallow evaluation due to neuro changes. PMH: HTN and DM  ?  ?Assessment / Plan / Recommendation  ?Clinical Impression ? Pt was seen for bedside swallow evaluation with his wife present who denied the pt having a history of dysphagia. Pt roused with verbal/tactile stimulation, but fell asleep soon after stimuli were removed. Oral mechanism exam was significant for left-sided facial weakness, reduced lingual ROM and reduced lingual strength. Dentition was reduced, but adequate for mastication. He presented with symptoms of oropharyngeal dysphagia characterized by anterior spillage, oral residue on lingual surface and left anterior sulcus, and signs of aspiration with thin liquids via straw when  large boluses are used. No s/sx of aspiration were noted with cued smaller sips or with thin liquids via cup. Pt's RN reported that the pt cannot be seated upright since the IR procedure was today. He was therefore reclined to ~30 degrees and placed in reverse trendelenburg to improve safety. However, positioning was still suboptimal and this likely contributed to his signs of aspiration with thin liquids. A dysphagia 2 diet with thin liquids will be initiated at this time with observance of swallowing precautions. SLP will follow to ensure tolerance and for diet advancement as clinically indicated. ?SLP Visit Diagnosis: Dysphagia, unspecified (R13.10) ?   ?Aspiration Risk ? Mild aspiration risk  ?  ?Diet Recommendation Dysphagia 2 (Fine chop);Thin liquid  ? ?Liquid Administration via: Cup;No straw (small sips if straw used) ?Medication Administration: Whole meds with puree ?Supervision: Staff to assist with self feeding ?Compensations: Slow rate;Small sips/bites;Follow solids with liquid ?Postural Changes: Seated upright at 90 degrees  ?  ?Other  Recommendations Oral Care Recommendations: Oral care BID   ? ?Recommendations for follow up therapy are one component of a multi-disciplinary discharge planning process, led by the attending physician.  Recommendations may be updated based on patient status, additional functional criteria and insurance authorization. ? ?Follow up Recommendations  (Continued SLP services at level of care recommended by PT/OT)  ? ? ?  ?Assistance Recommended at Discharge    ?Functional Status Assessment Patient has had a recent decline in their functional status and demonstrates the ability to make significant improvements in function in a reasonable and predictable amount of time.  ?Frequency and Duration min 2x/week  ?2 weeks ?  ?   ? ?  Prognosis Prognosis for Safe Diet Advancement: Good  ? ?  ? ?Swallow Study   ?General Date of Onset: 09/12/21 ?HPI: Pt is a 46 y.o. male with who presented  with left sided facial droop. TNK given. MRI brain 4/21: Tiny acute infarcts in the upper division right MCA cortex. Worsening neuro status noted after MRI with left-sided neglect, right gaze preference, sensory and vision loss, and progressive left sided weakness. Pt s/p  thrombectomy and revascularization 4/21. Pt passed [facility] on 4/21, but SLP was consulted subsequently for swallow evaluation due to neuro changes. PMH: HTN and DM ?Type of Study: Bedside Swallow Evaluation ?Previous Swallow Assessment: none ?Diet Prior to this Study: NPO ?Temperature Spikes Noted: No ?Respiratory Status: Nasal cannula ?History of Recent Intubation: Yes ?Length of Intubations (days):  (for procedure) ?Date extubated: 09/12/21 ?Behavior/Cognition: Alert;Cooperative;Pleasant mood ?Oral Cavity Assessment: Within Functional Limits ?Oral Care Completed by SLP: No ?Oral Cavity - Dentition: Adequate natural dentition ?Self-Feeding Abilities: Needs assist ?Patient Positioning: Partially reclined;Postural control adequate for testing ?Volitional Cough: Weak ?Volitional Swallow: Able to elicit  ?  ?Oral/Motor/Sensory Function Overall Oral Motor/Sensory Function: Moderate impairment ?Facial ROM: Suspected CN VII (facial) dysfunction;Reduced left ?Facial Symmetry: Suspected CN VII (facial) dysfunction;Abnormal symmetry left ?Facial Strength: Reduced left;Suspected CN VII (facial) dysfunction ?Facial Sensation: Within Functional Limits ?Lingual ROM: Reduced left;Suspected CN XII (hypoglossal) dysfunction ?Lingual Symmetry: Abnormal symmetry left;Suspected CN XII (hypoglossal) dysfunction ?Lingual Strength: Reduced;Suspected CN XII (hypoglossal) dysfunction ?Mandible: Within Functional Limits   ?Ice Chips Ice chips: Within functional limits ?Presentation: Spoon   ?Thin Liquid Thin Liquid: Impaired ?Presentation: Straw ?Oral Phase Impairments: Reduced labial seal ?Oral Phase Functional Implications: Left anterior spillage ?Pharyngeal  Phase  Impairments: Cough - Immediate  ?  ?Nectar Thick Nectar Thick Liquid: Not tested   ?Honey Thick Honey Thick Liquid: Not tested   ?Puree Puree: Impaired ?Presentation: Spoon ?Oral Phase Functional Implications: Oral residue;Left lateral sulci pocketing   ?Solid ? ? ?  Solid: Impaired ?Oral Phase Functional Implications: Left lateral sulci pocketing;Oral residue  ? ?  ?Emelio Schneller I. Hardin Negus, [place], CCC-SLP ?Acute Rehabilitation Services ?Office number [phone] ?Pager [phone] ? ?[name] ?09/12/2021,5:03 PM ? ? ? ? ? ?

## 2021-09-13 ENCOUNTER — Inpatient Hospital Stay (HOSPITAL_COMMUNITY): Payer: BC Managed Care – PPO

## 2021-09-13 DIAGNOSIS — I63511 Cerebral infarction due to unspecified occlusion or stenosis of right middle cerebral artery: Secondary | ICD-10-CM | POA: Diagnosis not present

## 2021-09-13 LAB — GLUCOSE, CAPILLARY
Glucose-Capillary: 230 mg/dL — ABNORMAL HIGH (ref 70–99)
Glucose-Capillary: 192 mg/dL — ABNORMAL HIGH (ref 70–99)
Glucose-Capillary: 252 mg/dL — ABNORMAL HIGH (ref 70–99)
Glucose-Capillary: 207 mg/dL — ABNORMAL HIGH (ref 70–99)
Glucose-Capillary: 218 mg/dL — ABNORMAL HIGH (ref 70–99)

## 2021-09-13 IMAGING — MR MR HEAD W/O CM
11 of 12 series · 44 of 48 positions shown · non-contrast
Comparison: CT, CTA, and MRA the preceding day.

CLINICAL DATA: Stroke follow-up.  24 hours post thrombolytics

EXAM:
MRI HEAD WITHOUT CONTRAST
MRA HEAD WITHOUT CONTRAST
TECHNIQUE: Multiplanar, multi-echo pulse sequences of the brain and surrounding
structures were acquired without intravenous contrast. Angiographic
images of the Circle of Willis were acquired using MRA technique
without intravenous contrast.

[Series 5: DWI · axial · 3.0mm · 0.88mm/px · z∈[-86,+64]mm · 9 of 102 slices shown (1 of 4)]
[im 1/102]
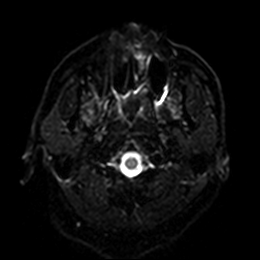
[im 13/102]
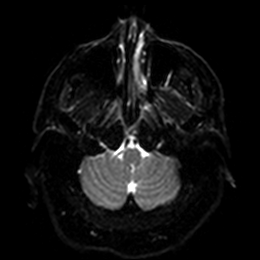
[im 26/102]
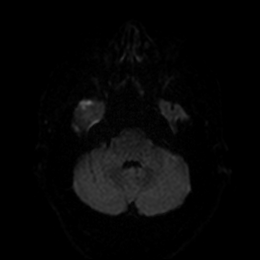
[im 38/102]
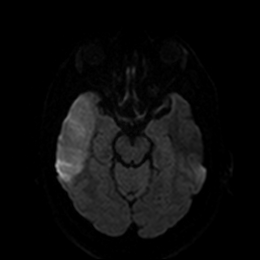
[im 51/102]
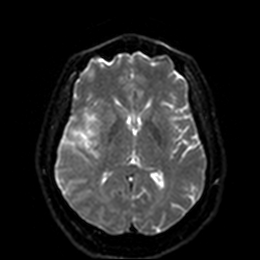
[im 64/102]
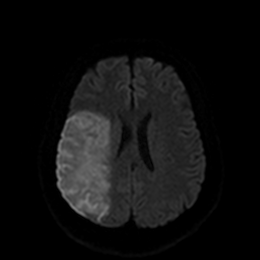
[im 76/102]
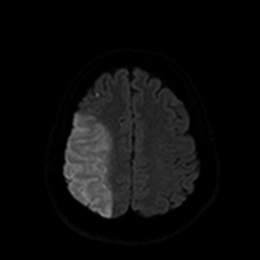
[im 89/102]
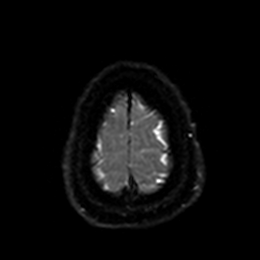
[im 102/102]
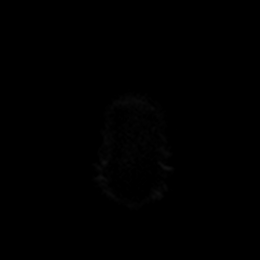

[Series 6: DWI · axial · 3.0mm · 0.88mm/px · z∈[-86,+64]mm · 5 of 51 slices shown (2 of 4)]
[im 1/51]
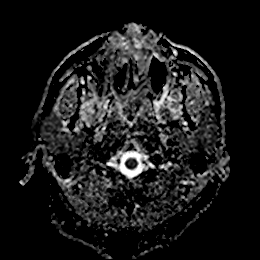
[im 13/51]
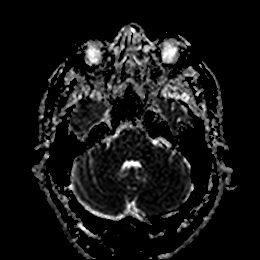
[im 26/51]
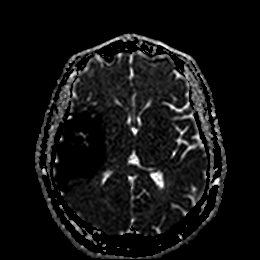
[im 38/51]
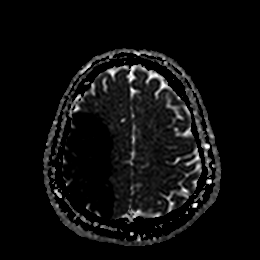
[im 51/51]
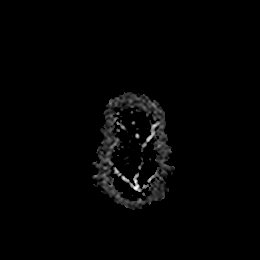

[Series 7: DWI · coronal · 4.0mm · 0.88mm/px · 5 of 68 slices shown (3 of 4)]
[im 1/68]
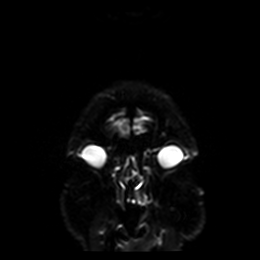
[im 17/68]
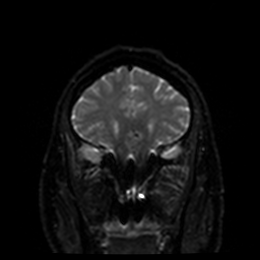
[im 34/68]
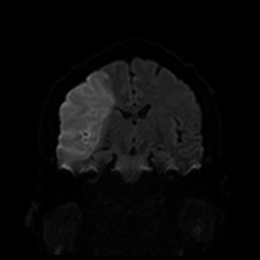
[im 51/68]
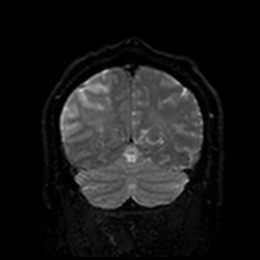
[im 68/68]
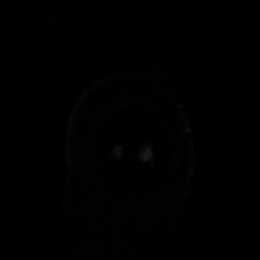

[Series 8: DWI · coronal · 4.0mm · 0.88mm/px · 3 of 34 slices shown (4 of 4)]
[im 1/34]
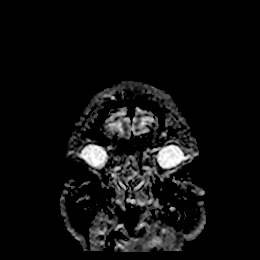
[im 17/34]
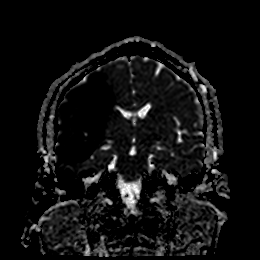
[im 34/34]
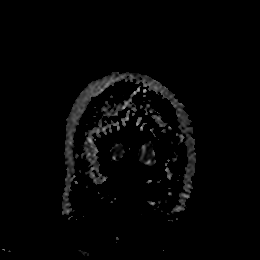

[Series 9: T1 · sagittal · 5.0mm · 0.75mm/px · 2 of 24 slices shown]
[im 1/24]
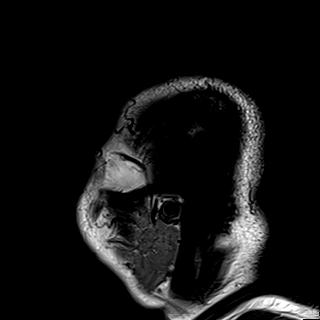
[im 24/24]
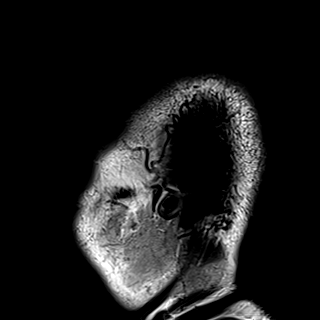

[Series 10: T2 · axial · 5.0mm · 0.72mm/px · z∈[-89,+67]mm · 2 of 27 slices shown]
[im 1/27]
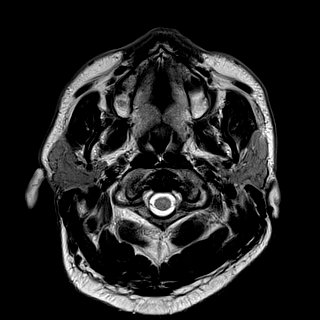
[im 27/27]
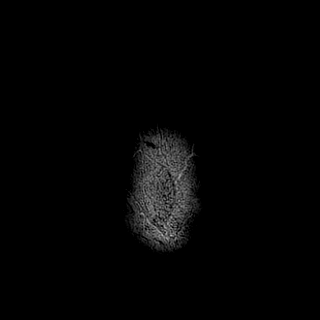

[Series 11: FLAIR · axial · 5.0mm · 0.45mm/px · z∈[-89,+67]mm · 2 of 27 slices shown]
[im 1/27]
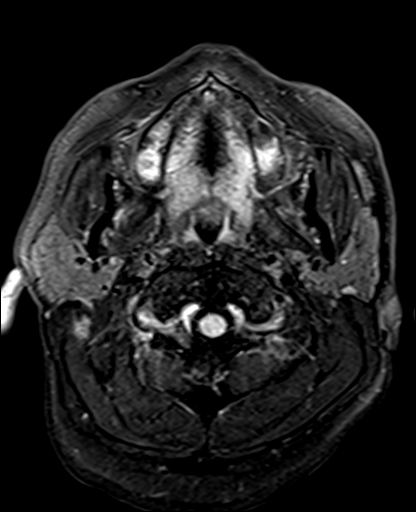
[im 27/27]
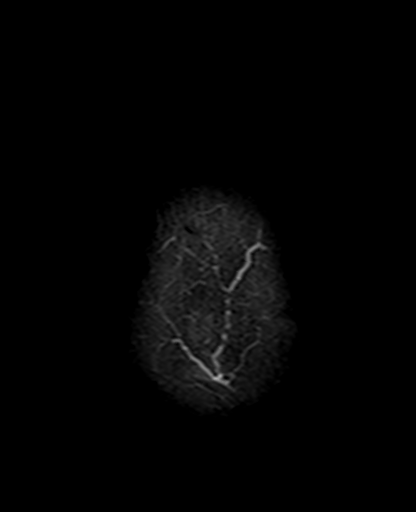

[Series 12: mag_images · axial · 3.0mm · 0.90mm/px · z∈[-93,+72]mm · 4 of 56 slices shown]
[im 1/56]
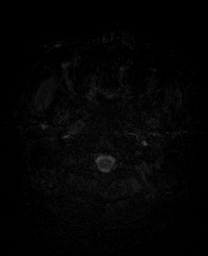
[im 19/56]
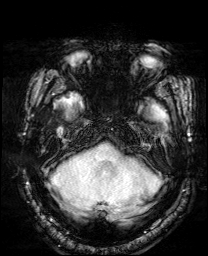
[im 37/56]
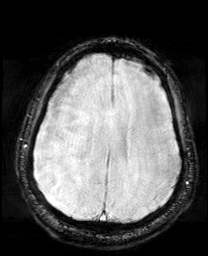
[im 56/56]
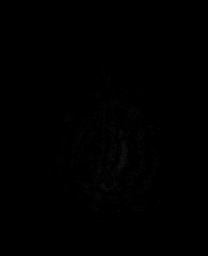

[Series 13: pha_images · axial · 3.0mm · 0.90mm/px · z∈[-93,+72]mm · 4 of 56 slices shown]
[im 1/56]
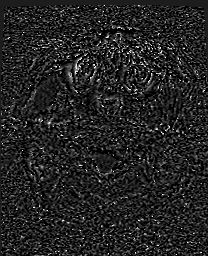
[im 19/56]
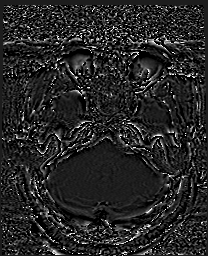
[im 37/56]
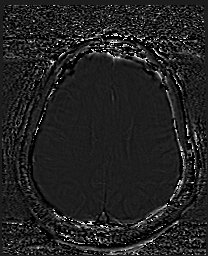
[im 56/56]
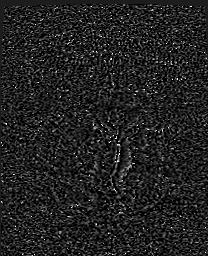

[Series 14: swi_images · axial · 3.0mm · 0.90mm/px · z∈[-93,+72]mm · 4 of 56 slices shown]
[im 1/56]
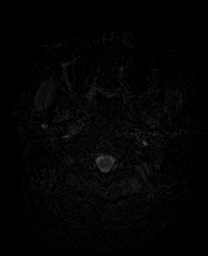
[im 19/56]
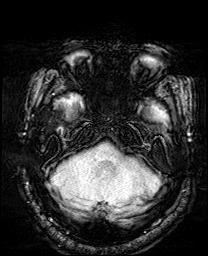
[im 37/56]
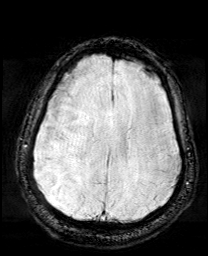
[im 56/56]
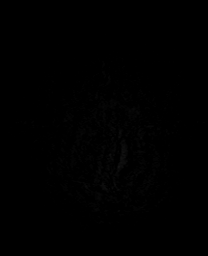

[Series 15: mip_images(sw) · axial · 24.0mm · 0.90mm/px · z∈[-83,+61]mm · 4 of 49 slices shown]
[im 1/49]
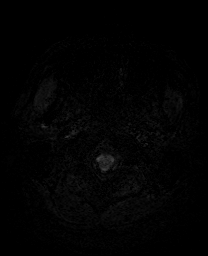
[im 17/49]
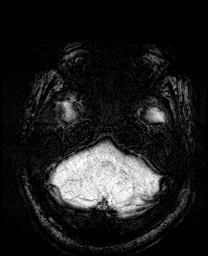
[im 33/49]
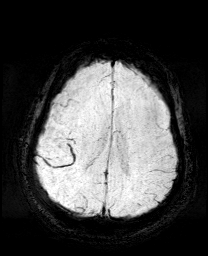
[im 49/49]
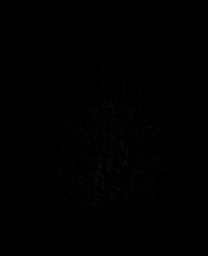

[44 of 48 positions shown; findings below may reference images not displayed]

FINDINGS: MRI HEAD FINDINGS

Brain: Progressive, confluent right MCA territory infarct diffusely
involving the cortex and sparing the basal ganglia. Swelling causes
2 mm of midline shift measured at the septum pellucidum. No
hemorrhage. Tubular gradient hypointensity along the affected right
cerebral hemisphere is intravascular. No pre-existing infarct,
hydrocephalus, or mass

Vascular: See below

Skull and upper cervical spine: Normal marrow signal

Sinuses/Orbits: Negative

Other: Intermittent motion artifact.

MRA HEAD FINDINGS

Anterior circulation: Atheromatous type irregularity of the carotid
siphons. More proximal ICA irregularity attributed to skull base
artifact. Reocclusion of the more inferior right M2 branch. Advanced
stenosis of the distal left M1 segment. Atheromatous irregularity of
medium size vessels, fairly extensive and greater than expected for
age

Posterior circulation: Patent vertebral and basilar arteries with
mild-to-moderate right vertebrobasilar junction stenosis.
Atheromatous irregularity of bilateral PCA, greater on the left,
without proximal occlusion. Negative for aneurysm
IMPRESSION: Brain MRI:

Progression to large right MCA territory infarct. Swelling causes 2
mm of midline shift.

Intracranial MRA:

1. Reoccluded right M2 branch.
2. Intracranial atherosclerosis including advanced left M1 stenosis.

## 2021-09-13 IMAGING — MR MR MRA HEAD W/O CM
1 series · 19 of 48 positions shown · non-contrast
Comparison: CT, CTA, and MRA the preceding day.

CLINICAL DATA: Stroke follow-up.  24 hours post thrombolytics

EXAM:
MRI HEAD WITHOUT CONTRAST
MRA HEAD WITHOUT CONTRAST
TECHNIQUE: Multiplanar, multi-echo pulse sequences of the brain and surrounding
structures were acquired without intravenous contrast. Angiographic
images of the Circle of Willis were acquired using MRA technique
without intravenous contrast.

[Series 5: 3d cow · axial · 0.5mm · 0.41mm/px · z∈[-87,+2]mm · 19 of 188 slices shown]
[im 1/188]
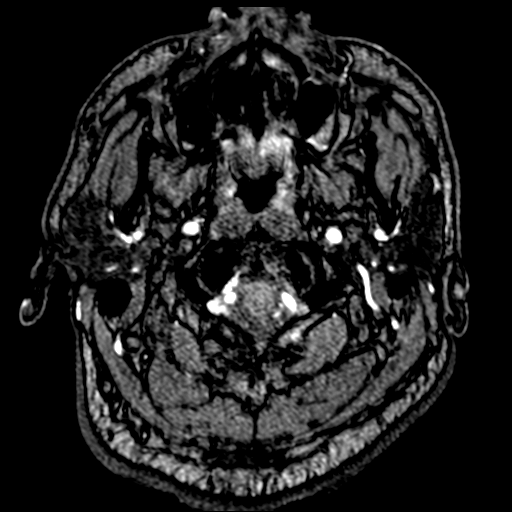
[im 4/188]
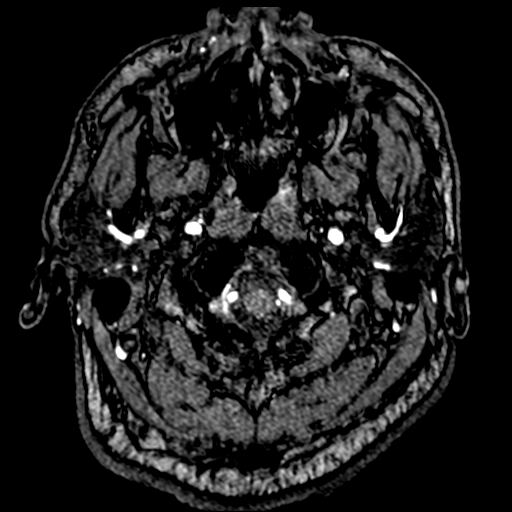
[im 8/188]
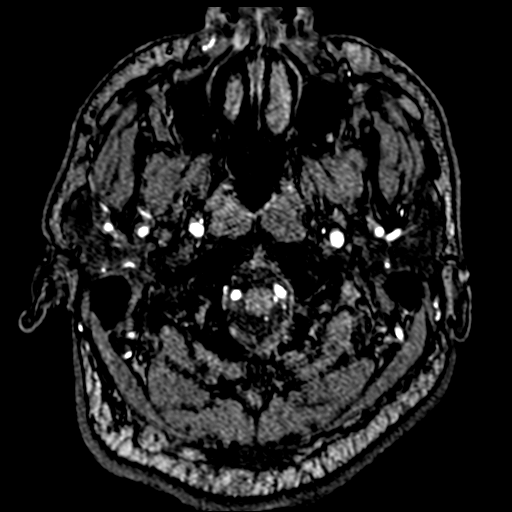
[im 12/188]
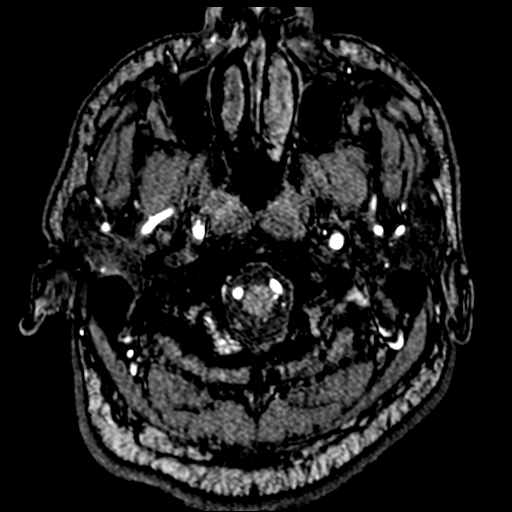
[im 16/188]
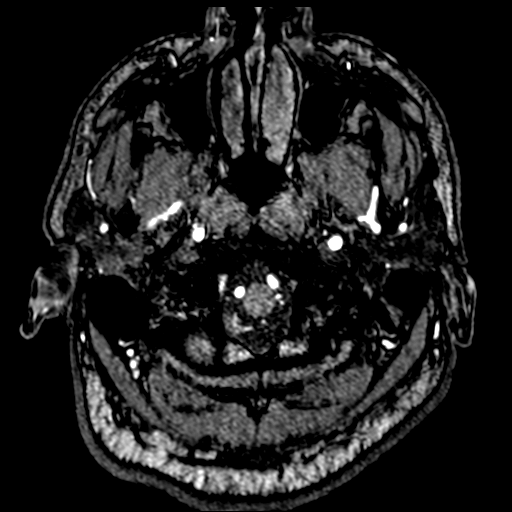
[im 20/188]
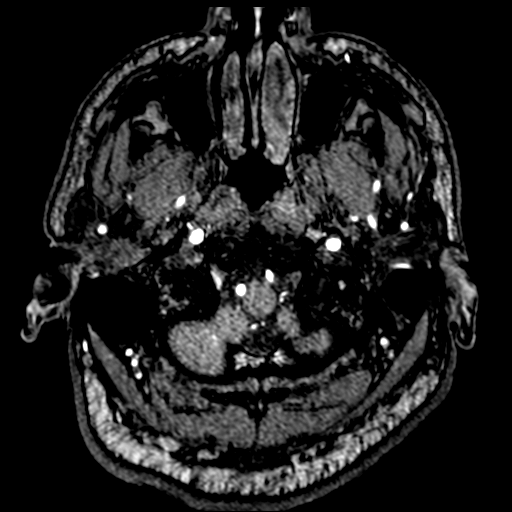
[im 24/188]
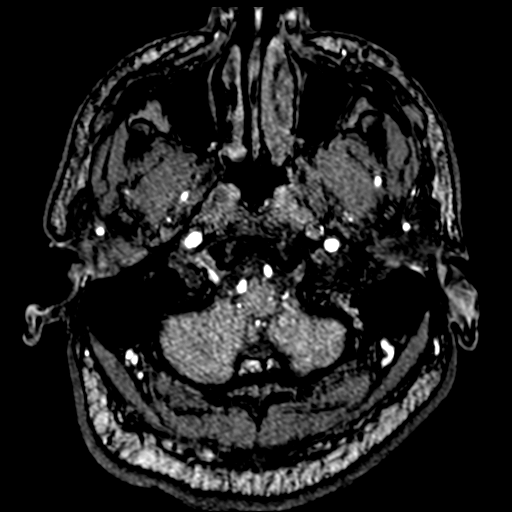
[im 28/188]
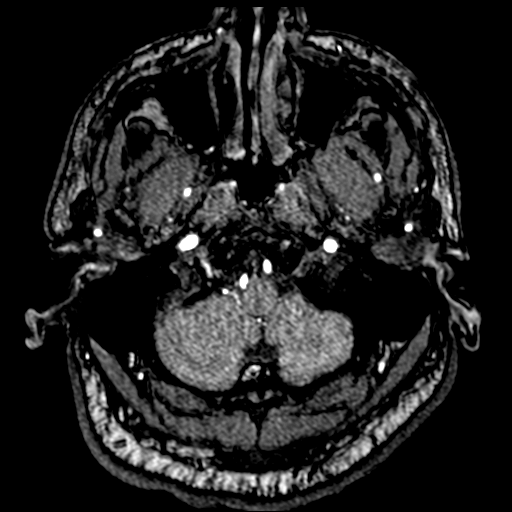
[im 32/188]
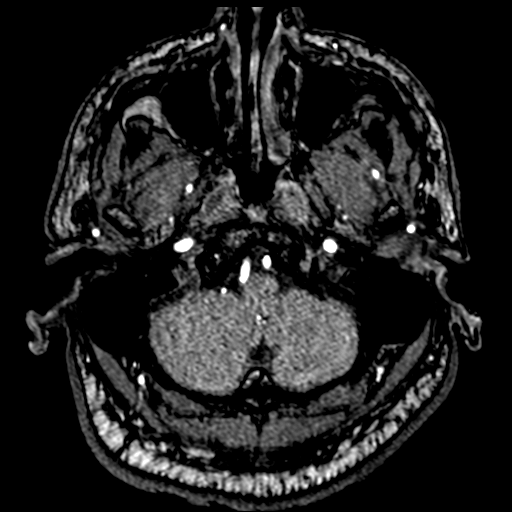
[im 36/188]
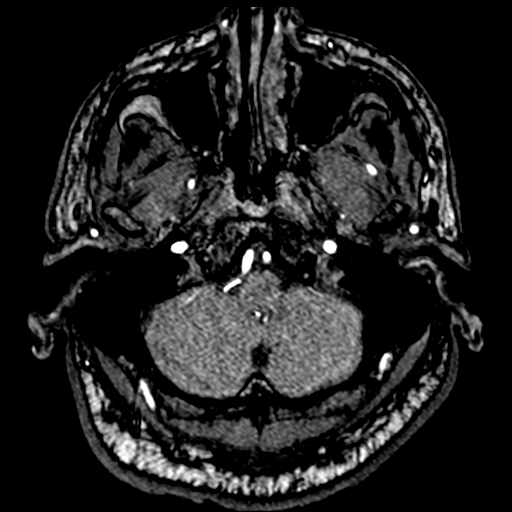
[im 40/188]
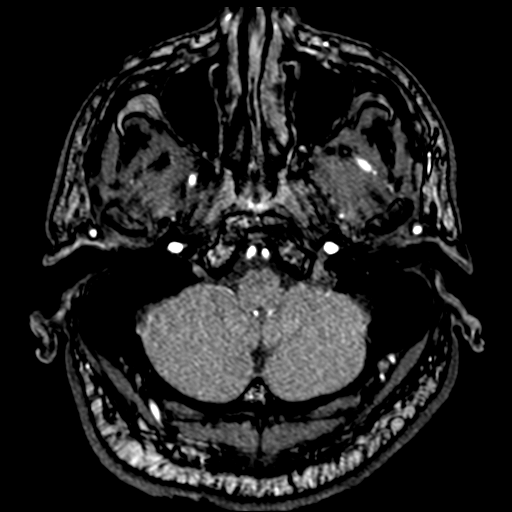
[im 60/188]
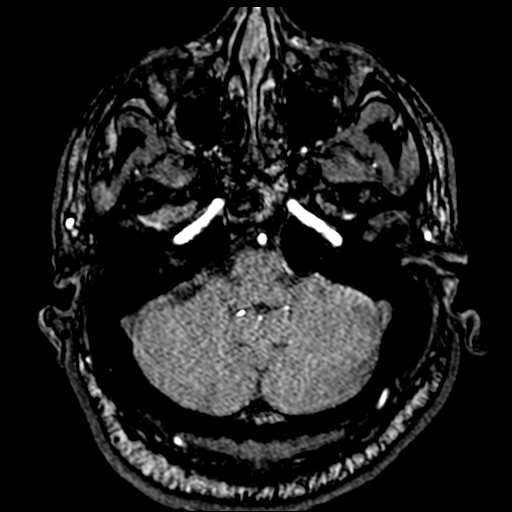
[im 84/188]
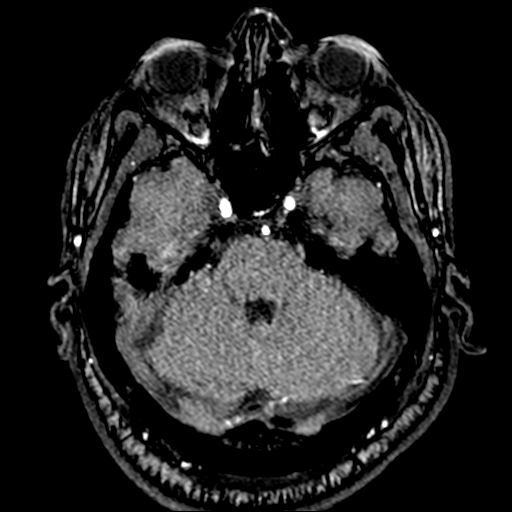
[im 96/188]
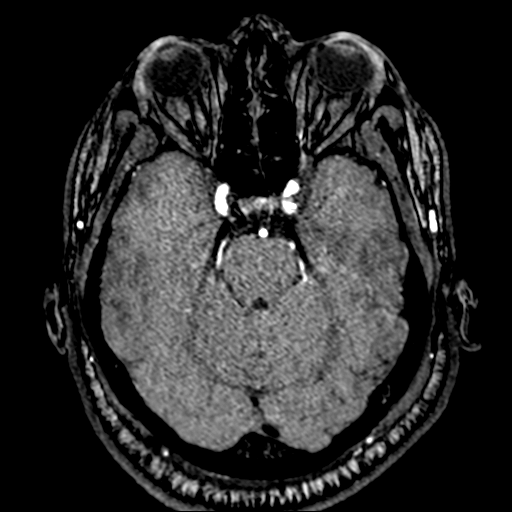
[im 108/188]
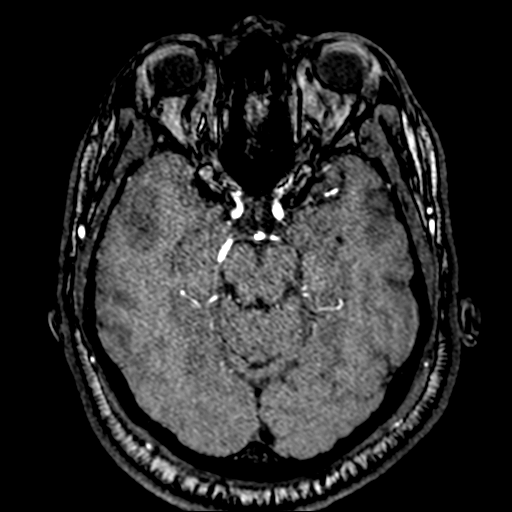
[im 132/188]
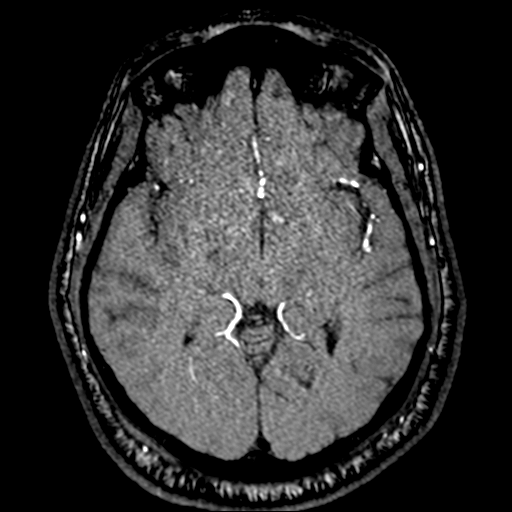
[im 156/188]
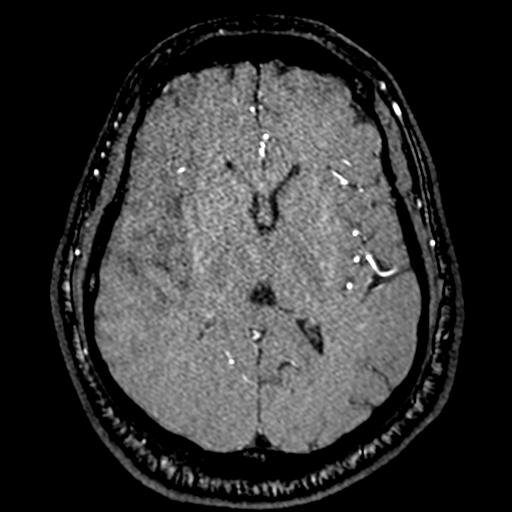
[im 160/188]
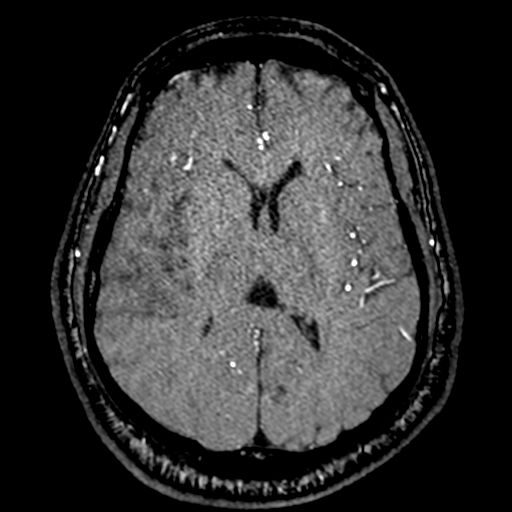
[im 180/188]
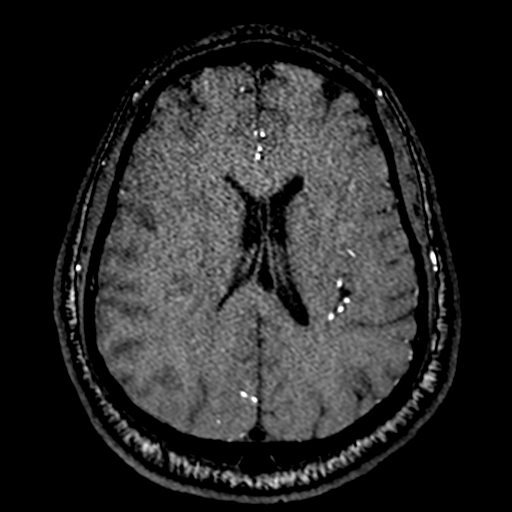

[19 of 48 positions shown; findings below may reference images not displayed]

FINDINGS: MRI HEAD FINDINGS

Brain: Progressive, confluent right MCA territory infarct diffusely
involving the cortex and sparing the basal ganglia. Swelling causes
2 mm of midline shift measured at the septum pellucidum. No
hemorrhage. Tubular gradient hypointensity along the affected right
cerebral hemisphere is intravascular. No pre-existing infarct,
hydrocephalus, or mass

Vascular: See below

Skull and upper cervical spine: Normal marrow signal

Sinuses/Orbits: Negative

Other: Intermittent motion artifact.

MRA HEAD FINDINGS

Anterior circulation: Atheromatous type irregularity of the carotid
siphons. More proximal ICA irregularity attributed to skull base
artifact. Reocclusion of the more inferior right M2 branch. Advanced
stenosis of the distal left M1 segment. Atheromatous irregularity of
medium size vessels, fairly extensive and greater than expected for
age

Posterior circulation: Patent vertebral and basilar arteries with
mild-to-moderate right vertebrobasilar junction stenosis.
Atheromatous irregularity of bilateral PCA, greater on the left,
without proximal occlusion. Negative for aneurysm
IMPRESSION: Brain MRI:

Progression to large right MCA territory infarct. Swelling causes 2
mm of midline shift.

Intracranial MRA:

1. Reoccluded right M2 branch.
2. Intracranial atherosclerosis including advanced left M1 stenosis.

## 2021-09-13 MED ORDER — HYDRALAZINE HCL 20 MG/ML IJ SOLN
20.0000 mg | Freq: Four times a day (QID) | INTRAMUSCULAR | Status: DC | PRN
  Administered 2021-09-14 – 2021-09-15 (×2): 20 mg via INTRAVENOUS
  Filled 2021-09-13 (×2): qty 1

## 2021-09-13 MED ORDER — LABETALOL HCL 5 MG/ML IV SOLN
10.0000 mg | INTRAVENOUS | Status: DC | PRN
  Administered 2021-09-14: 10 mg via INTRAVENOUS
  Filled 2021-09-13 (×2): qty 4

## 2021-09-13 MED ORDER — ENOXAPARIN SODIUM 40 MG/0.4ML IJ SOSY
40.0000 mg | PREFILLED_SYRINGE | INTRAMUSCULAR | Status: DC
  Administered 2021-09-13 – 2021-09-15 (×3): 40 mg via SUBCUTANEOUS
  Filled 2021-09-13 (×3): qty 0.4

## 2021-09-13 MED ORDER — LABETALOL HCL 5 MG/ML IV SOLN
20.0000 mg | INTRAVENOUS | Status: DC | PRN
  Filled 2021-09-13: qty 4

## 2021-09-13 MED ORDER — ACETAMINOPHEN-CODEINE #3 300-30 MG PO TABS
1.0000 | ORAL_TABLET | Freq: Four times a day (QID) | ORAL | Status: DC | PRN
  Administered 2021-09-13: 1 via ORAL
  Administered 2021-09-14 – 2021-09-15 (×4): 2 via ORAL
  Filled 2021-09-13 (×4): qty 2
  Filled 2021-09-13: qty 1

## 2021-09-13 MED ORDER — ASPIRIN EC 81 MG PO TBEC
81.0000 mg | DELAYED_RELEASE_TABLET | Freq: Every day | ORAL | Status: DC
  Administered 2021-09-13 – 2021-09-15 (×3): 81 mg via ORAL
  Filled 2021-09-13 (×3): qty 1

## 2021-09-13 MED ORDER — PANTOPRAZOLE SODIUM 40 MG PO TBEC
40.0000 mg | DELAYED_RELEASE_TABLET | Freq: Every day | ORAL | Status: DC
  Administered 2021-09-14 – 2021-09-15 (×2): 40 mg via ORAL
  Filled 2021-09-13 (×2): qty 1

## 2021-09-13 MED ORDER — HYDRALAZINE HCL 20 MG/ML IJ SOLN
25.0000 mg | Freq: Four times a day (QID) | INTRAMUSCULAR | Status: DC | PRN
  Administered 2021-09-13: 25 mg via INTRAVENOUS
  Filled 2021-09-13: qty 2

## 2021-09-13 MED ORDER — LISINOPRIL 10 MG PO TABS
10.0000 mg | ORAL_TABLET | Freq: Every day | ORAL | Status: DC
  Administered 2021-09-13 – 2021-09-15 (×3): 10 mg via ORAL
  Filled 2021-09-13 (×3): qty 1

## 2021-09-13 MED ORDER — INSULIN DETEMIR 100 UNIT/ML ~~LOC~~ SOLN
12.0000 [IU] | Freq: Two times a day (BID) | SUBCUTANEOUS | Status: DC
  Administered 2021-09-13 – 2021-09-15 (×5): 12 [IU] via SUBCUTANEOUS
  Filled 2021-09-13 (×6): qty 0.12

## 2021-09-13 MED ORDER — CLOPIDOGREL BISULFATE 75 MG PO TABS
75.0000 mg | ORAL_TABLET | Freq: Every day | ORAL | Status: DC
  Administered 2021-09-13 – 2021-09-15 (×3): 75 mg via ORAL
  Filled 2021-09-13 (×3): qty 1

## 2021-09-13 MED ORDER — TOPIRAMATE 25 MG PO TABS
25.0000 mg | ORAL_TABLET | Freq: Two times a day (BID) | ORAL | Status: DC
  Administered 2021-09-13 (×2): 25 mg via ORAL
  Filled 2021-09-13 (×2): qty 1

## 2021-09-13 MED ORDER — HYDROMORPHONE HCL 1 MG/ML IJ SOLN
1.0000 mg | Freq: Once | INTRAMUSCULAR | Status: AC
  Administered 2021-09-13: 1 mg via INTRAVENOUS
  Filled 2021-09-13: qty 1

## 2021-09-13 MED ORDER — AMLODIPINE BESYLATE 10 MG PO TABS
10.0000 mg | ORAL_TABLET | Freq: Every day | ORAL | Status: DC
  Administered 2021-09-13 – 2021-09-15 (×3): 10 mg via ORAL
  Filled 2021-09-13 (×3): qty 1

## 2021-09-13 NOTE — Progress Notes (Signed)
Inpatient Diabetes Program Recommendations ? ?AACE/ADA: New Consensus Statement on Inpatient Glycemic Control (2015) ? ?Target Ranges:  Prepandial:   less than 140 mg/dL ?     Peak postprandial:   less than 180 mg/dL (1-2 hours) ?     Critically ill patients:  140 - 180 mg/dL  ? ?Lab Results  ?Component Value Date  ? GLUCAP 252 (H) 09/13/2021  ? HGBA1C 8.1 (H) 09/12/2021  ? ? ?Review of Glycemic Control ? Latest Reference Range & Units 09/13/21 04:27 09/13/21 08:01 09/13/21 11:30  ?Glucose-Capillary 70 - 99 mg/dL 230 (H) 192 (H) 252 (H)  ? ?Diabetes history: DM 2 ?Outpatient Diabetes medications:  ?Glucotrol 10 mg bid, Metformin 1000 mg bid ?Current orders for Inpatient glycemic control:  ?Novolog resistant q 4 hours ?Inpatient Diabetes Program Recommendations:   ?Please add Levemir 12 units bid.  Will see patient on 4/24 to discuss home DM management and A1C.   ? ?Thanks,  ?Adah Perl, RN, BC-ADM ?Inpatient Diabetes Coordinator ?Pager [phone]  (8a-5p) ? ? ? ?

## 2021-09-13 NOTE — Evaluation (Signed)
Physical Therapy Evaluation ?Patient Details ?Name: Jacob [name] ?MRN: [id number] ?DOB: December 17, 1975 ?Today's Date: 09/13/2021 ? ?History of Present Illness ? 46 y.o. male presents to [facility] hospital on 09/12/2021 with L facial droop and arm weakness. CTA revealed R M1 occlusion. Pt received tNK. Pt underwent thrombectomy on 4/21. PMH includes HTN and DM.  ?Clinical Impression ? Pt presents to PT with deficits in strength, balance, functional mobility, power, visual-spatial awareness, power, cognition, communication. Pt with L hemiplegia and L neglect resulting in impaired functional mobility and balance. Pt is at a high risk for falls at this time due to L lateral lean and weakness. Pt with left neglect but is able to turn his head to the left and follow cues to attend to environment on left side. Pt will benefit from aggressive mobilization in an effort to improve mobility quality and reduce falls risk. PT recommends AIR admission at this time.   ?   ? ?Recommendations for follow up therapy are one component of a multi-disciplinary discharge planning process, led by the attending physician.  Recommendations may be updated based on patient status, additional functional criteria and insurance authorization. ? ?Follow Up Recommendations Acute inpatient rehab (3hours/day) ? ?  ?Assistance Recommended at Discharge Frequent or constant Supervision/Assistance  ?Patient can return home with the following ? Two people to help with walking and/or transfers;Two people to help with bathing/dressing/bathroom;Assistance with cooking/housework;Assistance with feeding;Direct supervision/assist for medications management;Direct supervision/assist for financial management;Assist for transportation;Help with stairs or ramp for entrance ? ?  ?Equipment Recommendations Wheelchair (measurements PT);Wheelchair cushion (measurements PT);BSC/3in1;Hospital bed  ?Recommendations for Other Services ? Rehab consult  ?  ?Functional Status Assessment  Patient has had a recent decline in their functional status and demonstrates the ability to make significant improvements in function in a reasonable and predictable amount of time.  ? ?  ?Precautions / Restrictions Precautions ?Precautions: Fall ?Precaution Comments: L neglect ?Restrictions ?Weight Bearing Restrictions: No  ? ?  ? ?Mobility ? Bed Mobility ?Overal bed mobility: Needs Assistance ?Bed Mobility: Sidelying to Sit ?  ?Sidelying to sit: Mod assist, +2 for physical assistance ?  ?  ?  ?General bed mobility comments: verbal cues to utilize RLE to hook under LLE. Assist to elevated trunk and maintain balance ?  ? ?Transfers ?Overall transfer level: Needs assistance ?Equipment used: 2 person hand held assist ?Transfers: Sit to/from Stand, Bed to chair/wheelchair/BSC ?Sit to Stand: Mod assist, +2 physical assistance ?  ?  ?Squat pivot transfers: Max assist, +2 physical assistance ?  ?  ?General transfer comment: BUE support, L knee block, leftward lean. Transfer to R side ?  ? ?Ambulation/Gait ?  ?  ?  ?  ?  ?  ?Pre-gait activities: weight shifting in standing with BUE support and L knee block. Knee buckle with weight shifting initiated by the patient rather then by therapist ?  ? ?Stairs ?  ?  ?  ?  ?  ? ?Wheelchair Mobility ?  ? ?Modified Rankin (Stroke Patients Only) ?Modified Rankin (Stroke Patients Only) ?Pre-Morbid Rankin Score: No symptoms ?Modified Rankin: Severe disability ? ?  ? ?Balance Overall balance assessment: Needs assistance ?Sitting-balance support: Single extremity supported, Feet supported ?Sitting balance-Leahy Scale: Poor ?Sitting balance - Comments: min-modA, L lateral lean ?Postural control: Left lateral lean ?Standing balance support: Bilateral upper extremity supported ?Standing balance-Leahy Scale: Poor ?Standing balance comment: modA x2, L knee block, leftward lean ?  ?  ?  ?  ?  ?  ?  ?  ?  ?  ?  ?   ? ? ? ?  Pertinent Vitals/Pain Pain Assessment ?Pain Assessment: Faces ?Faces Pain  Scale: Hurts little more ?Pain Location: head ?Pain Descriptors / Indicators: Headache ?Pain Intervention(s): Monitored during session  ? ? ?Home Living Family/patient expects to be discharged to:: Private residence ?Living Arrangements: Spouse/significant other;Children ?Available Help at Discharge: Family;Available 24 hours/day ?Type of Home: House ?Home Access: Stairs to enter ?  ?Entrance Stairs-Number of Steps: threshold ?Alternate Level Stairs-Number of Steps: full flight ?Home Layout: Two level;Able to live on main level with bedroom/bathroom ?Home Equipment: None ?   ?  ?Prior Function Prior Level of Function : Independent/Modified Independent;Working/employed;Driving ?  ?  ?  ?  ?  ?  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  ?  ? ? ?Hand Dominance  ? Dominant Hand: Right ? ?  ?Extremity/Trunk Assessment  ? Upper Extremity Assessment ?Upper Extremity Assessment: LUE deficits/detail ?LUE Deficits / Details: flaccid with no AROM noted.  Mild flexor tone with yawning ?LUE Sensation: decreased light touch;decreased proprioception ?LUE Coordination: decreased fine motor;decreased gross motor ?  ? ?Lower Extremity Assessment ?Lower Extremity Assessment: Defer to PT evaluation ?LLE Deficits / Details: no active motion noted, pt does respond to pain, PROM WFL ?  ? ?Cervical / Trunk Assessment ?Cervical / Trunk Assessment: Kyphotic  ?Communication  ? Communication: Expressive difficulties  ?Cognition Arousal/Alertness: Lethargic (becomes alert with stimulation) ?Behavior During Therapy: Flat affect ?Overall Cognitive Status: Impaired/Different from baseline ?Area of Impairment: Attention, Following commands, Safety/judgement, Awareness, Problem solving ?  ?  ?  ?  ?  ?  ?  ?  ?  ?Current Attention Level: Sustained ?  ?Following Commands: Follows one step commands with increased time ?Safety/Judgement: Decreased awareness of safety, Decreased awareness of deficits ?Awareness:  Intellectual ?Problem Solving: Slow processing, Requires verbal cues, Difficulty sequencing ?  ?  ?  ? ?  ?General Comments General comments (skin integrity, edema, etc.): VSS, pt on 2L [place], weaned to room air with activity. Pt sats slowly downtrending from 97% to 94% by end of mobility. PT places pt back on oxygen. Pt is able to bring eyes to midline but not left of midline. Pt with significant left neglect but does turn head and attempt to attend to left with verbal cues ? ?  ?Exercises    ? ?Assessment/Plan  ?  ?PT Assessment Patient needs continued PT services  ?PT Problem List Decreased strength;Decreased activity tolerance;Decreased balance;Decreased mobility;Decreased coordination;Decreased cognition;Decreased knowledge of use of DME;Decreased safety awareness;Decreased knowledge of precautions;Impaired sensation;Impaired tone ? ?   ?  ?PT Treatment Interventions DME instruction;Gait training;Stair training;Functional mobility training;Therapeutic activities;Therapeutic exercise;Balance training;Neuromuscular re-education;Cognitive remediation;Patient/family education;Wheelchair mobility training   ? ?PT Goals (Current goals can be found in the Care Plan section)  ?Acute Rehab PT Goals ?Patient Stated Goal: to return to independence in mobility ?PT Goal Formulation: With patient/family ?Time For Goal Achievement: 09/27/21 ?Potential to Achieve Goals: Fair ? ?  ?Frequency Min 4X/week ?  ? ? ?Co-evaluation PT/OT/SLP Co-Evaluation/Treatment: Yes ?Reason for Co-Treatment: Complexity of the patient's impairments (multi-system involvement);For patient/therapist safety ?PT goals addressed during session: Mobility/safety with mobility;Balance;Strengthening/ROM ?OT goals addressed during session: ADL's and self-care ?  ? ? ?  ?AM-PAC PT "6 Clicks" Mobility  ?Outcome Measure Help needed turning from your back to your side while in a flat bed without using bedrails?: A Lot ?Help needed moving from lying on your back to  sitting on the side of a flat bed without using bedrails?: Total ?Help needed moving to and from  a bed to a chair (including a wheelchair)?: Total ?Help needed standing up from a chair using your arms (e.g., wheel

## 2021-09-13 NOTE — Evaluation (Signed)
Speech Language Pathology Evaluation ?Patient Details ?Name: Jacob [name] ?MRN: [name] ?DOB: Oct 06, 1975 ?Today's Date: 09/13/2021 ?Time: 1106-1130 ?SLP Time Calculation (min) (ACUTE ONLY): 24 min ? ?Problem List:  ?Patient Active Problem List  ? Diagnosis Date Noted  ? Stroke (cerebrum) ([place]) 09/12/2021  ? Vitamin D deficiency 11/04/2020  ? Hypertensive urgency 10/31/2020  ? Hyperlipidemia 03/28/2018  ? Diabetes mellitus without complication ([place]) [id number]  ? Essential hypertension 02/23/2018  ? Acute appendicitis 10/23/2013  ? ?Past Medical History:  ?Past Medical History:  ?Diagnosis Date  ? Allergy   ? Diabetes mellitus without complication ([place])   ? Hypertension   ? ?Past Surgical History:  ?Past Surgical History:  ?Procedure Laterality Date  ? APPENDECTOMY    ? IR CT HEAD LTD  09/12/2021  ? IR PERCUTANEOUS ART THROMBECTOMY/INFUSION INTRACRANIAL INC DIAG ANGIO  09/12/2021  ? IR US GUIDE VASC ACCESS RIGHT  09/12/2021  ? LAPAROSCOPIC APPENDECTOMY  10/24/2013  ? Procedure: APPENDECTOMY LAPAROSCOPIC;  Surgeon: Shann Medal, MD;  Location: [facility] ORS;  Service: General;;  ? ?HPI:  ?Pt is a 46 y.o. male with who presented with left sided facial droop. TNK given. MRI brain 4/21: Tiny acute infarcts in the upper division right MCA cortex. Worsening neuro status noted after MRI with left-sided neglect, right gaze preference, sensory and vision loss, and progressive left sided weakness. Pt s/p  thrombectomy and revascularization 4/21. Pt passed [facility] on 4/21, but SLP was consulted subsequently for swallow evaluation due to neuro changes. PMH: HTN and DM  ? ?Assessment / Plan / Recommendation ?Clinical Impression ? P participated in cognitive linguistic assessment. He demonstrated irght gaze preference and left neglect.  Pragmatics marked by flat affect, monotone speech.  Poor awareness and insight. Short-term memory is a relative strength - demonstrated recall of 4/5 objects after two minutes and re-told a short paragraph  with inclusion of 5/5 details. Notable delayed response time, but generally able to retain task instructions during delay.  Selective attention impaired.  Had difficulty with clock-drawing task with poor spacing of numerals (favoring right side of circle) and concrete interpretation of time.  Recommend SLP f/u while admitted to acute care. His partner, at the bedside, states she can assemble 24-hour support post-D/C if AIR is an option. ?   ?SLP Assessment ? SLP Recommendation/Assessment: Patient needs continued [place] Pathology Services ?SLP Visit Diagnosis: Cognitive communication deficit (R41.841)  ?  ?Recommendations for follow up therapy are one component of a multi-disciplinary discharge planning process, led by the attending physician.  Recommendations may be updated based on patient status, additional functional criteria and insurance authorization. ?   ?Follow Up Recommendations ? Acute inpatient rehab (3hours/day)  ?  ?Assistance Recommended at Discharge ? Frequent or constant Supervision/Assistance  ?Functional Status Assessment Patient has had a recent decline in their functional status and demonstrates the ability to make significant improvements in function in a reasonable and predictable amount of time.  ?Frequency and Duration min 2x/week  ?2 weeks ?  ?   ?SLP Evaluation ?Cognition ? Overall Cognitive Status: Impaired/Different from baseline ?Arousal/Alertness: Awake/alert ?Orientation Level: Disoriented to time;Oriented to person;Oriented to place ?Attention: Sustained;Selective ?Sustained Attention: Appears intact ?Selective Attention: Impaired ?Selective Attention Impairment: Verbal basic ?Memory: Appears intact ?Awareness: Impaired ?Awareness Impairment: Intellectual impairment ?Problem Solving: Impaired ?Problem Solving Impairment: Verbal basic ?Executive Function: Self Monitoring ?Self Monitoring: Impaired ?Self Monitoring Impairment: Verbal basic;Functional basic ?Safety/Judgment:  Impaired  ?  ?   ?Comprehension ? Auditory Comprehension ?Overall Auditory Comprehension: Appears within functional  limits for tasks assessed ?Visual Recognition/Discrimination ?Discrimination: Not tested ?Reading Comprehension ?Reading Status: Not tested  ?  ?Expression Expression ?Primary Mode of Expression: Verbal ?Verbal Expression ?Pragmatics: Impairment ?Impairments: Eye contact;Monotone;Abnormal affect ?Written Expression ?Dominant Hand: Right ?Written Expression: Not tested   ?Oral / Motor ? Oral Motor/Sensory Function ?Overall Oral Motor/Sensory Function: Moderate impairment ?Facial ROM: Suspected CN VII (facial) dysfunction;Reduced left ?Facial Symmetry: Suspected CN VII (facial) dysfunction;Abnormal symmetry left ?Facial Strength: Reduced left;Suspected CN VII (facial) dysfunction ?Facial Sensation: Within Functional Limits ?Lingual ROM: Reduced left;Suspected CN XII (hypoglossal) dysfunction ?Lingual Symmetry: Abnormal symmetry left;Suspected CN XII (hypoglossal) dysfunction ?Lingual Strength: Reduced;Suspected CN XII (hypoglossal) dysfunction ?Mandible: Within Functional Limits ?Motor Speech ?Overall Motor Speech: Appears within functional limits for tasks assessed   ?        ? ?Jacob [name] ?09/13/2021, 12:00 PM ?Amri Lien L. Idell Hissong, MA CCC/SLP ?Acute Rehabilitation Services ?Office number [phone] ?Pager [phone] ? ?

## 2021-09-13 NOTE — Progress Notes (Signed)
Speech Language Pathology Treatment: Dysphagia  ?Patient Details ?Name: Jacob [name] ?MRN: [name] ?DOB: August 14, 1975 ?Today's Date: 09/13/2021 ?Time: [place]:[id number] ?SLP Time Calculation (min) (ACUTE ONLY): 14 min ? ?Assessment / Plan / Recommendation ?Clinical Impression ? Pt has eaten minimally since PO diet started yesterday.  More alert and interactive today. Demonstrated ongoing anterior spillage from left and mild cough with large successive sips.  Coughing eliminated with controlled sips, required verbal/visual cues to go slowly and pace himself.  Wife at bedside and precautions reviewed. SLP will continue to follow for safety and diet advancement when ready. Continue dysphagia 2/thin liquids with full supervision. ?  ?HPI HPI: Pt is a 46 y.o. male with who presented with left sided facial droop. TNK given. MRI brain 4/21: Tiny acute infarcts in the upper division right MCA cortex. Worsening neuro status noted after MRI with left-sided neglect, right gaze preference, sensory and vision loss, and progressive left sided weakness. Pt s/p  thrombectomy and revascularization 4/21. Pt passed [facility] on 4/21, but SLP was consulted subsequently for swallow evaluation due to neuro changes. PMH: HTN and DM ?  ?   ?SLP Plan ? Continue with current plan of care ? ?Patient needs continued Speech Lanaguage Pathology Services ?  ?Recommendations for follow up therapy are one component of a multi-disciplinary discharge planning process, led by the attending physician.  Recommendations may be updated based on patient status, additional functional criteria and insurance authorization. ?  ? ?Recommendations  ?Diet recommendations: Dysphagia 2 (fine chop);Thin liquid ?Liquids provided via: Cup;Straw ?Medication Administration: Whole meds with puree ?Supervision: Staff to assist with self feeding;Patient able to self feed ?Compensations: Slow rate;Small sips/bites;Follow solids with liquid  ?   ?    ?   ? ? ? ? Oral Care  Recommendations: Oral care BID ?Follow Up Recommendations: Acute inpatient rehab (3hours/day) ?Assistance recommended at discharge: Frequent or constant Supervision/Assistance ?SLP Visit Diagnosis: Dysphagia, oropharyngeal phase (R13.12) ?Plan: Continue with current plan of care ? ? ? ? ?  ?  ?Nikiya Starn L. Jayma Volpi, MA CCC/SLP ?Acute Rehabilitation Services ?Office number [phone] ?Pager [phone] ? ? ?[name] ? ?09/13/2021, 12:04 PM ?

## 2021-09-13 NOTE — Progress Notes (Signed)
Inpatient Rehab Admissions Coordinator Note:  ? ?Per PT/OT/ST patient was screened for CIR candidacy by Maxemiliano Riel Danford Bad, CCC-SLP. At this time, pt appears to be a potential candidate for CIR. I will place an order for rehab consult for full assessment, per our protocol.  Please contact me any with questions.. ? ? ?Gayland Curry, MS, CCC-SLP ?Admissions Coordinator ?[id number] ?09/13/21 ?6:00 PM ? ?

## 2021-09-13 NOTE — Progress Notes (Addendum)
STROKE TEAM PROGRESS NOTE  ? ?INTERVAL HISTORY ?Seen lying lying in bed after thrombectomy. Partner at the bedside.  ?MRI this morning shows reocclusion at M3.  Patient is seen sitting up in bed alert, still on Cleviprex for blood pressure control.  Continues to have dense left hemiplegia but no neglect.  Follows commands well.. ? ?Vitals:  ? 09/13/21 1230 09/13/21 1300 09/13/21 1400 09/13/21 1430  ?BP: (!) 161/95 (!) 163/93 (!) 153/108 (!) 173/107  ?Pulse: (!) 58 (!) 55 (!) 56 61  ?Resp: 15 18 18 20   ?Temp:      ?TempSrc:      ?SpO2: 98% 99% 97% 100%  ?Weight:      ?Height:      ? ?CBC:  ?Recent Labs  ?Lab 09/12/21 ?0300 09/12/21 ?0305  ?WBC 5.5  --   ?NEUTROABS 2.1  --   ?HGB 14.3 13.6  ?HCT 40.0 40.0  ?MCV 83.0  --   ?PLT 174  --   ? ? ?Basic Metabolic Panel:  ?Recent Labs  ?Lab 09/12/21 ?0300 09/12/21 ?0305  ?NA 137 139  ?K 3.5 3.6  ?CL 103 101  ?CO2 26  --   ?GLUCOSE 308* 308*  ?BUN 11 13  ?CREATININE 1.04 1.00  ?CALCIUM 9.0  --   ? ? ?Lipid Panel:  ?Recent Labs  ?Lab 09/12/21 ?0356  ?CHOL 191  ?TRIG 284*  ?HDL 38*  ?CHOLHDL 5.0  ?VLDL 57*  ?Young Harris 96  ? ? ?HgbA1c:  ?Recent Labs  ?Lab 09/12/21 ?0300  ?HGBA1C 8.1*  ? ? ?Urine Drug Screen:  ?Recent Labs  ?Lab 09/12/21 ?1437  ?LABOPIA NONE DETECTED  ?COCAINSCRNUR NONE DETECTED  ?LABBENZ NONE DETECTED  ?AMPHETMU NONE DETECTED  ?THCU NONE DETECTED  ?LABBARB NONE DETECTED  ?  ?Alcohol Level No results for input(s): ETH in the last 168 hours. ? ?IMAGING past 24 hours ?MR ANGIO HEAD WO CONTRAST ? ?Result Date: 09/13/2021 ?CLINICAL DATA:  Stroke follow-up.  24 hours post thrombolytics EXAM: MRI HEAD WITHOUT CONTRAST MRA HEAD WITHOUT CONTRAST TECHNIQUE: Multiplanar, multi-echo pulse sequences of the brain and surrounding structures were acquired without intravenous contrast. Angiographic images of the Circle of Willis were acquired using MRA technique without intravenous contrast. COMPARISON:  CT, CTA, and MRA the preceding day. FINDINGS: MRI HEAD FINDINGS Brain:  Progressive, confluent right MCA territory infarct diffusely involving the cortex and sparing the basal ganglia. Swelling causes 2 mm of midline shift measured at the septum pellucidum. No hemorrhage. Tubular gradient hypointensity along the affected right cerebral hemisphere is intravascular. No pre-existing infarct, hydrocephalus, or mass Vascular: See below Skull and upper cervical spine: Normal marrow signal Sinuses/Orbits: Negative Other: Intermittent motion artifact. MRA HEAD FINDINGS Anterior circulation: Atheromatous type irregularity of the carotid siphons. More proximal ICA irregularity attributed to skull base artifact. Reocclusion of the more inferior right M2 branch. Advanced stenosis of the distal left M1 segment. Atheromatous irregularity of medium size vessels, fairly extensive and greater than expected for age Posterior circulation: Patent vertebral and basilar arteries with mild-to-moderate right vertebrobasilar junction stenosis. Atheromatous irregularity of bilateral PCA, greater on the left, without proximal occlusion. Negative for aneurysm IMPRESSION: Brain MRI: Progression to large right MCA territory infarct. Swelling causes 2 mm of midline shift. Intracranial MRA: 1. Reoccluded right M2 branch. 2. Intracranial atherosclerosis including advanced left M1 stenosis. Electronically Signed   By: Jorje Guild M.D.   On: 09/13/2021 04:25  ? ?MR BRAIN WO CONTRAST ? ?Result Date: 09/13/2021 ?CLINICAL DATA:  Stroke follow-up.  24 hours  post thrombolytics EXAM: MRI HEAD WITHOUT CONTRAST MRA HEAD WITHOUT CONTRAST TECHNIQUE: Multiplanar, multi-echo pulse sequences of the brain and surrounding structures were acquired without intravenous contrast. Angiographic images of the Circle of Willis were acquired using MRA technique without intravenous contrast. COMPARISON:  CT, CTA, and MRA the preceding day. FINDINGS: MRI HEAD FINDINGS Brain: Progressive, confluent right MCA territory infarct diffusely  involving the cortex and sparing the basal ganglia. Swelling causes 2 mm of midline shift measured at the septum pellucidum. No hemorrhage. Tubular gradient hypointensity along the affected right cerebral hemisphere is intravascular. No pre-existing infarct, hydrocephalus, or mass Vascular: See below Skull and upper cervical spine: Normal marrow signal Sinuses/Orbits: Negative Other: Intermittent motion artifact. MRA HEAD FINDINGS Anterior circulation: Atheromatous type irregularity of the carotid siphons. More proximal ICA irregularity attributed to skull base artifact. Reocclusion of the more inferior right M2 branch. Advanced stenosis of the distal left M1 segment. Atheromatous irregularity of medium size vessels, fairly extensive and greater than expected for age Posterior circulation: Patent vertebral and basilar arteries with mild-to-moderate right vertebrobasilar junction stenosis. Atheromatous irregularity of bilateral PCA, greater on the left, without proximal occlusion. Negative for aneurysm IMPRESSION: Brain MRI: Progression to large right MCA territory infarct. Swelling causes 2 mm of midline shift. Intracranial MRA: 1. Reoccluded right M2 branch. 2. Intracranial atherosclerosis including advanced left M1 stenosis. Electronically Signed   By: Jorje Guild M.D.   On: 09/13/2021 04:25   ? ?PHYSICAL EXAM ? ?Physical Exam  ?Constitutional: Appears well-developed and well-nourished.  ?Cardiovascular: Normal rate and regular rhythm.  ?Respiratory: Effort normal, non-labored breathing ? ?Neuro: ?Mental Status: ?Patient is drowsy, oriented to person ?Mild dysarthria and mild nonfluent speech.  No neglect ?Cranial Nerves: ?II: Visual Fields are full. Pupils are equal, round, and reactive to light.   ?III,IV, VI: Dysconjugate gaze L eye abduction.  Right gaze preference and unable to look to the left all the way ?V: Facial sensation is symmetric to temperature ?VII: Left facial droop ?VIII: Hearing is intact to  voice ?X: cough and gag intact ?XI: Head is midline ?XII: Tongue protrudes midline without atrophy or fasciculations.  ?Motor: ?Tone is normal. Bulk is normal.  ?LUE 0/5   RUE 5/5 ?LLE 1/5   RLE 5/5 ?Sensory: ?Decreased sensation on the left face, left arm, and left leg ?Cerebellar: ?FNF and HKS intact on the right.  Unable to complete on the left due to weakness ? ? ? ?  ? ?ASSESSMENT/PLAN ?[name] is a 46 y.o. male with history of HTN, DM2 presenting with left side facial droop, left arm weakness. Received TNKase. Neuro status declined during MRI (NIHSS 13),taken for thrombectomy with Dr. Karenann Cai. TICI3 recanalization of the right M2/MCA achieved.  MRI/MRA shows reoccluded right M2 branch with a right MCA territory infarct.  Continue with PT/OT/SLP evals.  Resume home blood pressure medications with a goal of discontinuing Cleviprex.  BP goal less than 160. ? ?Stroke:  Right MCA due to right MCA occlusion  s/p TNK and mechanical thrombectomy with TICI3 revascularization etiology not certain, but most likely cryptogenic versus intracranial atherosclerosis ?code Stroke CT head No acute abnormality ?CTA head & neck R short segment M1 occlusion, left distal M1 high grade stenosis ?Post IR CT No hemorrhagic complication on flat panel CT. ?Early MRI Tiny acute infarcts in the upper division right MCA cortex. ?MRI right MCA territory infarct involving the cortex, sparing the basal ganglia.  2 mm midline shift.  No hemorrhage ?MRA reoccluded right  M2 branch, intracranial atherosclerosis including advanced left M1 stenosis ?2D Echo EF > 75% ?LDL 96 ?HgbA1c 8.1 ?UDS negative ?VTE prophylaxis - SCDs ?No antithrombotic prior to admission, now on ASA 81 mg and Plavix 75 mg ?Therapy recommendations:  Pending ?Disposition:  Pending ? ?Intracranial stenosis ?CTA head & neck R short segment M1 occlusion, left distal M1 high grade stenosis ?No significant extracranial stenosis ?Most likely related to long  standing uncontrolled risk factors. ? ?Hypertension ?Home meds:  Amlodipine 10mg , lisinopril 10mg -resumed ?Stable ?BP goal 160  ?Long-term BP goal normotensive ?Avoid low BP ?PRN labetalol and hydralazine ? ?Hyperlipi

## 2021-09-13 NOTE — Evaluation (Signed)
Occupational Therapy Evaluation Patient Details Name: Jacob [name] MRN: [id number] DOB: 05-15-1976 Today's Date: 09/13/2021   History of Present Illness 46 y.o. male presents to [facility] hospital on 09/12/2021 with L facial droop and arm weakness. CTA revealed R M1 occlusion. Pt received tNK. Pt underwent thrombectomy on 4/21. PMH includes HTN and DM.   Clinical Impression   Patient admitted for the diagnosis above.  PTA he worked 6 days/wk, up to 12 hours/day, and is independent with all ADL/IADL and mobility.  Deficits impacting his functional status are listed below.  Currently he is needing up to +2 assist for basic transfers, and up to Max A for lower body ADL at bedlevel. OT will follow in the acute setting, and AIR is recommended for post acute rehab.  Patient would benefit from an agressive multi disciplined approach to post acute rehab prior to returning home.         Recommendations for follow up therapy are one component of a multi-disciplinary discharge planning process, led by the attending physician.  Recommendations may be updated based on patient status, additional functional criteria and insurance authorization.   Follow Up Recommendations  Acute inpatient rehab (3hours/day)    Assistance Recommended at Discharge Frequent or constant Supervision/Assistance  Patient can return home with the following A lot of help with bathing/dressing/bathroom;Two people to help with walking and/or transfers;Help with stairs or ramp for entrance;Assist for transportation;Assistance with cooking/housework;Direct supervision/assist for financial management;Direct supervision/assist for medications management    Functional Status Assessment  Patient has had a recent decline in their functional status and demonstrates the ability to make significant improvements in function in a reasonable and predictable amount of time.  Equipment Recommendations  BSC/3in1;Wheelchair (measurements OT);Wheelchair  cushion (measurements OT);Hospital bed    Recommendations for Other Services Rehab consult     Precautions / Restrictions Precautions Precautions: Fall Restrictions Weight Bearing Restrictions: No      Mobility Bed Mobility Overal bed mobility: Needs Assistance Bed Mobility: Sidelying to Sit   Sidelying to sit: Mod assist, +2 for physical assistance       General bed mobility comments: assist with legs and trunk - difficulty holding midline balance. Patient Response: Cooperative  Transfers Overall transfer level: Needs assistance   Transfers: Sit to/from Stand, Bed to chair/wheelchair/BSC Sit to Stand: Mod assist, +2 physical assistance   Squat pivot transfers: Max assist       General transfer comment: to R side      Balance Overall balance assessment: Needs assistance Sitting-balance support: Single extremity supported, Feet supported Sitting balance-Leahy Scale: Poor   Postural control: Right lateral lean, Posterior lean Standing balance support: Bilateral upper extremity supported Standing balance-Leahy Scale: Poor                             ADL either performed or assessed with clinical judgement   ADL Overall ADL's : Needs assistance/impaired     Grooming: Wash/dry face;Minimal assistance;Bed level   Upper Body Bathing: Moderate assistance;Bed level   Lower Body Bathing: Total assistance;Bed level   Upper Body Dressing : Maximal assistance;Sitting   Lower Body Dressing: Bed level;Maximal assistance       Toileting- Clothing Manipulation and Hygiene: Bed level;Total assistance               Vision   Vision Assessment?: Yes Ocular Range of Motion: Restricted on the left Alignment/Gaze Preference: Gaze right Tracking/Visual Pursuits: Unable to hold eye position out  of midline;Decreased smoothness of horizontal tracking;Requires cues, head turns, or add eye shifts to track Convergence: Impaired (comment) Visual Fields: Left  visual field deficit Depth Perception: Overshoots;Undershoots     Perception Perception Perception: Impaired Inattention/Neglect: Does not attend to left visual field;Does not attend to left side of body   Praxis Praxis Praxis: Intact    Pertinent Vitals/Pain Pain Assessment Pain Assessment: Faces Faces Pain Scale: Hurts little more Pain Location: head Pain Descriptors / Indicators: Aching Pain Intervention(s): Monitored during session, Premedicated before session     Hand Dominance Right   Extremity/Trunk Assessment Upper Extremity Assessment Upper Extremity Assessment: LUE deficits/detail LUE Deficits / Details: flaccid with no AROM noted.  Mild flexor tone with yawning LUE Sensation: decreased light touch;decreased proprioception LUE Coordination: decreased fine motor;decreased gross motor   Lower Extremity Assessment Lower Extremity Assessment: Defer to PT evaluation   Cervical / Trunk Assessment Cervical / Trunk Assessment: Kyphotic   Communication Communication Communication: Expressive difficulties   Cognition Arousal/Alertness: Lethargic Behavior During Therapy: Flat affect Overall Cognitive Status: Impaired/Different from baseline                                       General Comments   VSS on RA.  O2 replaced at the end of the session.      Exercises     Shoulder Instructions      Home Living Family/patient expects to be discharged to:: Private residence Living Arrangements: Spouse/significant other;Children Available Help at Discharge: Family;Available 24 hours/day Type of Home: House Home Access: Stairs to enter [employer] of Steps: threshold   Home Layout: Two level;Able to live on main level with bedroom/bathroom Alternate Level Stairs-Number of Steps: full flight Alternate Level Stairs-Rails: Left Bathroom Shower/Tub: Walk-in shower;Tub only   Bathroom Toilet: Standard Bathroom Accessibility: Yes How  Accessible: Accessible via walker Home Equipment: None      Lives With: Spouse;Family    Prior Functioning/Environment Prior Level of Function : Independent/Modified Independent;Working/employed;Driving                        OT Problem List: Decreased strength;Decreased range of motion;Decreased activity tolerance;Impaired balance (sitting and/or standing);Impaired vision/perception;Decreased knowledge of precautions;Decreased knowledge of use of DME or AE;Decreased safety awareness;Decreased coordination;Impaired sensation;Impaired tone;Impaired UE functional use;Pain      OT Treatment/Interventions: Self-care/ADL training;Therapeutic exercise;Therapeutic activities;Neuromuscular education;Visual/perceptual remediation/compensation;Patient/family education;DME and/or AE instruction;Balance training    OT Goals(Current goals can be found in the care plan section) Acute Rehab OT Goals OT Goal Formulation: Patient unable to participate in goal setting Time For Goal Achievement: 09/26/21 Potential to Achieve Goals: Good ADL Goals Pt Will Perform Grooming: with min assist;sitting Pt Will Perform Upper Body Dressing: with min assist;sitting Pt Will Transfer to Toilet: with mod assist;squat pivot transfer;bedside commode Pt/caregiver will Perform Home Exercise Program: Increased strength;Left upper extremity;With minimal assist;With written HEP provided Additional ADL Goal #1: Patient will sit edge of bed with min guard and min VC's for midline posture Additional ADL Goal #2: Patient will attend to the left visual field 50% of the time with Mod VC's  OT Frequency: Min 2X/week    Co-evaluation PT/OT/SLP Co-Evaluation/Treatment: Yes Reason for Co-Treatment: Complexity of the patient's impairments (multi-system involvement);For patient/therapist safety PT goals addressed during session: Mobility/safety with mobility;Balance;Strengthening/ROM OT goals addressed during session: ADL's  and self-care      AM-PAC OT "6 Clicks" Daily Activity  Outcome Measure Help from another person eating meals?: A Little Help from another person taking care of personal grooming?: A Lot Help from another person toileting, which includes using toliet, bedpan, or urinal?: A Lot Help from another person bathing (including washing, rinsing, drying)?: A Lot Help from another person to put on and taking off regular upper body clothing?: A Lot Help from another person to put on and taking off regular lower body clothing?: A Lot 6 Click Score: 13   End of Session Equipment Utilized During Treatment: Gait belt Nurse Communication: Mobility status  Activity Tolerance: Patient tolerated treatment well Patient left: in chair;with call bell/phone within reach;with family/visitor present  OT Visit Diagnosis: Unsteadiness on feet (R26.81);Muscle weakness (generalized) (M62.81);Pain;Hemiplegia and hemiparesis;Feeding difficulties (R63.3);Low vision, both eyes (H54.2) Hemiplegia - Right/Left: Left Hemiplegia - dominant/non-dominant: Non-Dominant Hemiplegia - caused by: Cerebral infarction                Time: 1520-1545 OT Time Calculation (min): 25 min Charges:  OT General Charges $OT Visit: 1 Visit OT Evaluation $OT Eval Moderate Complexity: 1 Mod  09/13/2021  RP, OTR/L  Acute Rehabilitation Services  Office:  [phone]   Suzanna Obey 09/13/2021, 3:57 PM

## 2021-09-14 DIAGNOSIS — I63511 Cerebral infarction due to unspecified occlusion or stenosis of right middle cerebral artery: Secondary | ICD-10-CM | POA: Diagnosis not present

## 2021-09-14 LAB — GLUCOSE, CAPILLARY
Glucose-Capillary: 148 mg/dL — ABNORMAL HIGH (ref 70–99)
Glucose-Capillary: 158 mg/dL — ABNORMAL HIGH (ref 70–99)
Glucose-Capillary: 163 mg/dL — ABNORMAL HIGH (ref 70–99)
Glucose-Capillary: 172 mg/dL — ABNORMAL HIGH (ref 70–99)
Glucose-Capillary: 206 mg/dL — ABNORMAL HIGH (ref 70–99)
Glucose-Capillary: 238 mg/dL — ABNORMAL HIGH (ref 70–99)

## 2021-09-14 MED ORDER — TOPIRAMATE 25 MG PO TABS
50.0000 mg | ORAL_TABLET | Freq: Two times a day (BID) | ORAL | Status: DC
  Administered 2021-09-14 – 2021-09-15 (×3): 50 mg via ORAL
  Filled 2021-09-14 (×4): qty 2

## 2021-09-14 NOTE — PMR Pre-admission (Shared)
PMR Admission Coordinator Pre-Admission Assessment ? ?Patient: Jacob [name] is an 46 y.o., male ?MRN: [name] ?DOB: 11-21-1975 ?Height: 6\' 1"  (185.4 cm) ?Weight: 124 kg ? ?Insurance Information ?HMO:     PPO: yes     PCP:      IPA:      80/20:      OTHER:  ?PRIMARY: BCBS COMM PPO      ?Policy#: [id number]      Subscriber: patient ?CM Name: ***      Phone#: ***     Fax#: *** ?Pre-Cert#: ***      Employer: *** ?Benefits:  Phone #: ***     Name: *** ?Eff. Date: ***     Deduct: ***      Out of Pocket Max: ***      Life Max: *** ?CIR: ***      SNF: *** ?Outpatient: ***     Co-Pay: *** ?Home Health: ***      Co-Pay: *** ?DME: ***     Co-Pay: *** ?Providers: in-network ?SECONDARY:       Policy#:      Phone#:  ? ?Financial Counselor:       Phone#:  ? ?The ?Data Collection Information Summary? for patients in Inpatient Rehabilitation Facilities with attached ?Privacy Act [place] Records? was provided and verbally reviewed with: N/A ? ?Emergency Contact Information ?Contact Information   ? ? Name Relation Home Work Mobile  ? Paxton,Lashaunna Spouse   [phone]  ? Claudean Kinds [id number]    ? ?  ? ? ?Current Medical History  ?Patient Admitting Diagnosis: CVA ?History of Present Illness: Pt is a 46 year old male with medical hx significant for: DM and  HTN. Pt presented to the hospital on 09/12/21 as a code stroke. Pt developed sudden onest left-sided weakness and difficulty speaking. CT head negative for acute abnormality. MRI showed small acute infarct in right MCA. CTA positive for M1 occlusion. Initially TNK not administered d/t mildness of symptoms. After noting the fluctuating of symptoms, TNK was administered.  Pt had a decline during MRI; determined to be a thrombectomy candidate. Pt underwent cerebral angiogram and mechanical thrombectomy on 09/12/21. Complete TICI3 recanalization of right M2/MCA obtained.  Repeat MRI on 09/13/21 showed reocclusion at M3.***Therapy evaluation completed and  CIR recommended d/t pt's deficits in functional mobility, inability to complete ADLs independently, and cognitive-linguistic deficits.  ?On 09/15/21, pt had a change in status, not responding to verbal or physical stimuli and left facial droop. CT showed increased edema and 39mm leftward midline shift. Pt underwent right frontotemporal parietal hemicraniectomy and placement of bone flap in abdominal subcutaneous tissue.  ?Complete NIHSS TOTAL: 19 ? ?Patient's medical record from [facility] has been reviewed by the rehabilitation admission coordinator and physician. ? ?Past Medical History  ?Past Medical History:  ?Diagnosis Date  ? Allergy   ? Diabetes mellitus without complication ([place])   ? Hypertension   ? ? ?Has the patient had major surgery during 100 days prior to admission? Yes ? ?Family History   ?family history includes Asthma in his son. ? ?Current Medications ? ?Current Facility-Administered Medications:  ?   stroke: early stages of recovery book, , Does not apply, Once, Greta Doom, MD ?  0.9 %  sodium chloride infusion, 50 mL/hr, Intravenous, Continuous, Greta Doom, MD, Last Rate: 50 mL/hr at 09/14/21 1100, Infusion Verify at 09/14/21 1100 ?  acetaminophen (TYLENOL) tablet 650 mg, 650 mg, Oral, Q4H PRN, 650 mg at 09/14/21  0305 **OR** acetaminophen (TYLENOL) 160 MG/5ML solution 650 mg, 650 mg, Per Tube, Q4H PRN **OR** acetaminophen (TYLENOL) suppository 650 mg, 650 mg, Rectal, Q4H PRN, Greta Doom, MD ?  acetaminophen-codeine (TYLENOL #3) 300-30 MG per tablet 1-2 tablet, 1-2 tablet, Oral, Q6H PRN, Janine Ores, NP, 2 tablet at 09/14/21 [id number] ?  amLODipine (NORVASC) tablet 10 mg, 10 mg, Oral, Daily, Shafer, Devon, NP, 10 mg at 09/14/21 1027 ?  aspirin EC tablet 81 mg, 81 mg, Oral, Daily, Shafer, Devon, NP, 81 mg at 09/14/21 1027 ?  atorvastatin (LIPITOR) tablet 40 mg, 40 mg, Oral, Daily, Rosalin Hawking, MD, 40 mg at 09/14/21 1027 ?  Chlorhexidine Gluconate Cloth 2 %  PADS 6 each, 6 each, Topical, Q0600, Greta Doom, MD, 6 each at 09/13/21 2200 ?  clevidipine (CLEVIPREX) infusion 0.5 mg/mL, 0-32 mg/hr, Intravenous, Continuous, Shafer, Devon, NP, Stopped at 09/13/21 1217 ?  clopidogrel (PLAVIX) tablet 75 mg, 75 mg, Oral, Daily, Shafer, Devon, NP, 75 mg at 09/14/21 1027 ?  enoxaparin (LOVENOX) injection 40 mg, 40 mg, Subcutaneous, Q24H, Shafer, Devon, NP, 40 mg at 09/14/21 1028 ?  hydrALAZINE (APRESOLINE) injection 20 mg, 20 mg, Intravenous, Q6H PRN, Garvin Fila, MD ?  insulin aspart (novoLOG) injection 0-20 Units, 0-20 Units, Subcutaneous, Q4H, Greta Doom, MD, 4 Units at 09/14/21 1156 ?  insulin detemir (LEVEMIR) injection 12 Units, 12 Units, Subcutaneous, BID, Shafer, Devon, NP, 12 Units at 09/14/21 1028 ?  labetalol (NORMODYNE) injection 10 mg, 10 mg, Intravenous, Q10 min PRN, Janine Ores, NP ?  lisinopril (ZESTRIL) tablet 10 mg, 10 mg, Oral, Daily, Shafer, Devon, NP, 10 mg at 09/14/21 1027 ?  ondansetron (ZOFRAN) injection 4 mg, 4 mg, Intravenous, Q6H PRN, de Sindy Messing, [place], MD ?  pantoprazole (PROTONIX) EC tablet 40 mg, 40 mg, Oral, Daily, Rosalin Hawking, MD, 40 mg at 09/14/21 1027 ?  sodium chloride flush (NS) 0.9 % injection 3 mL, 3 mL, Intravenous, Once, Quintella Reichert, MD ?  topiramate (TOPAMAX) tablet 50 mg, 50 mg, Oral, BID, Shafer, Devon, NP, 50 mg at 09/14/21 1027 ? ?Patients Current Diet:  ?Diet Order   ? ?       ?  DIET DYS 2 Room service appropriate? No; Fluid consistency: Thin  Diet effective now       ?  ? ?  ?  ? ?  ? ? ?Precautions / Restrictions ?Precautions ?Precautions: Fall ?Precaution Comments: L neglect ?Restrictions ?Weight Bearing Restrictions: No  ? ?Has the patient had 2 or more falls or a fall with injury in the past year? No ? ?Prior Activity Level ?Community (5-7x/wk): works and drives ? ?Prior Functional Level ?Self Care: Did the patient need help bathing, dressing, using the toilet or eating?  Independent ? ?Indoor Mobility: Did the patient need assistance with walking from room to room (with or without device)? Independent ? ?Stairs: Did the patient need assistance with internal or external stairs (with or without device)? Independent ? ?Functional Cognition: Did the patient need help planning regular tasks such as shopping or remembering to take medications? Independent ? ?Patient Information ?  ? ?Patient's Response To:  ?  ? ?Home Assistive Devices / Equipment ?Home Equipment: None ? ?Prior Device Use: Indicate devices/aids used by the patient prior to current illness, exacerbation or injury? None of the above ? ?Current Functional Level ?Cognition ? Arousal/Alertness: Awake/alert ?Overall Cognitive Status: Impaired/Different from baseline ?Current Attention Level: Sustained ?Orientation Level: Oriented X4 ?Following Commands: Follows one step commands with increased  time ?Safety/Judgement: Decreased awareness of safety, Decreased awareness of deficits ?Attention: Sustained, Selective ?Sustained Attention: Appears intact ?Selective Attention: Impaired ?Selective Attention Impairment: Verbal basic ?Memory: Appears intact ?Awareness: Impaired ?Awareness Impairment: Intellectual impairment ?Problem Solving: Impaired ?Problem Solving Impairment: Verbal basic ?Executive Function: Self Monitoring ?Self Monitoring: Impaired ?Self Monitoring Impairment: Verbal basic, Functional basic ?Safety/Judgment: Impaired ?   ?Extremity Assessment ?(includes Sensation/Coordination) ? Upper Extremity Assessment: LUE deficits/detail ?LUE Deficits / Details: flaccid with no AROM noted.  Mild flexor tone with yawning ?LUE Sensation: decreased light touch, decreased proprioception ?LUE Coordination: decreased fine motor, decreased gross motor  ?Lower Extremity Assessment: Defer to PT evaluation ?LLE Deficits / Details: no active motion noted, pt does respond to pain, PROM WFL  ?  ?ADLs ? Overall ADL's : Needs  assistance/impaired ?Grooming: Wash/dry face, Minimal assistance, Bed level ?Upper Body Bathing: Moderate assistance, Bed level ?Lower Body Bathing: Total assistance, Bed level ?Upper Body Dressing : Maximal assistance, Sitting ?Lower

## 2021-09-14 NOTE — Progress Notes (Signed)
Pt transported to 3w22 without any complications, receiving RN given report and wife at bedside.  ?

## 2021-09-14 NOTE — Progress Notes (Signed)
Inpatient Rehab Admissions: ? ?Inpatient Rehab Consult received.  I met with patient and his wife [name] at the bedside for rehabilitation assessment and to discuss goals and expectations of an inpatient rehab admission.  Wife acknowledged understanding of CIR goals and expectations. Wife interested in pt pursuing CIR. Will continue to follow. ? ?Signed: ?Gayland Curry, MS, CCC-SLP ?Admissions Coordinator ?[phone] ? ? ?

## 2021-09-14 NOTE — Progress Notes (Addendum)
Patient transferred in the unit from 4N-ICU at 2120 pm, A&Ox1, drowsy, Vts shows MEWS yellow, but not new, pt's spouse is in bedside, and will continue to monitor closely. ?

## 2021-09-14 NOTE — Progress Notes (Addendum)
STROKE TEAM PROGRESS NOTE  ? ?INTERVAL HISTORY ?His wife is at the bedside.  Patient is seen sitting up in bed drowsy but can be aroused and follows commands well on the right side continues to have dense left hemiplegia.,  He has been weaned off Cleviprex drip for blood pressure control.   Still complaining of headaches despite being on Topamax 25 twice daily and states Tylenol 3 has helped. ? ?Vitals:  ? 09/14/21 0500 09/14/21 0600 09/14/21 0700 09/14/21 0800  ?BP: (!) 160/88 (!) 173/94 (!) 171/78 (!) 168/87  ?Pulse: 62 81 (!) 59 70  ?Resp: (!) 23 (!) 26 20 17   ?Temp:    98.7 ?F (37.1 ?C)  ?TempSrc:      ?SpO2: 97% 95% 94% 94%  ?Weight:      ?Height:      ? ?CBC:  ?Recent Labs  ?Lab 09/12/21 ?0300 09/12/21 ?0305  ?WBC 5.5  --   ?NEUTROABS 2.1  --   ?HGB 14.3 13.6  ?HCT 40.0 40.0  ?MCV 83.0  --   ?PLT 174  --   ? ? ?Basic Metabolic Panel:  ?Recent Labs  ?Lab 09/12/21 ?0300 09/12/21 ?0305  ?NA 137 139  ?K 3.5 3.6  ?CL 103 101  ?CO2 26  --   ?GLUCOSE 308* 308*  ?BUN 11 13  ?CREATININE 1.04 1.00  ?CALCIUM 9.0  --   ? ? ?Lipid Panel:  ?Recent Labs  ?Lab 09/12/21 ?0356  ?CHOL 191  ?TRIG 284*  ?HDL 38*  ?CHOLHDL 5.0  ?VLDL 57*  ?[place] 96  ? ? ?HgbA1c:  ?Recent Labs  ?Lab 09/12/21 ?0300  ?HGBA1C 8.1*  ? ? ?Urine Drug Screen:  ?Recent Labs  ?Lab 09/12/21 ?1437  ?LABOPIA NONE DETECTED  ?COCAINSCRNUR NONE DETECTED  ?LABBENZ NONE DETECTED  ?AMPHETMU NONE DETECTED  ?THCU NONE DETECTED  ?LABBARB NONE DETECTED  ? ?  ?Alcohol Level No results for input(s): ETH in the last 168 hours. ? ?IMAGING past 24 hours ?No results found. ? ?PHYSICAL EXAM ? ?Physical Exam  ?Constitutional: Appears well-developed and well-nourished middle-age African-American male.  ?Cardiovascular: Normal rate and regular rhythm.  ?Respiratory: Effort normal, non-labored breathing ? ?Neuro: ?Mental Status: ?Patient is drowsy, can be aroused with some difficulty.  Oriented to person ?Mild dysarthria and mild nonfluent speech.  No neglect ?Cranial  Nerves: ?II: Visual Fields are full. Pupils are equal, round, and reactive to light.   ?III,IV, VI: Dysconjugate gaze L eye abduction.  Right gaze preference and unable to look to the left all the way ?V: Facial sensation is symmetric to temperature ?VII: Left facial droop ?VIII: Hearing is intact to voice ?X: cough and gag intact ?XI: Head is midline ?XII: Tongue protrudes midline without atrophy or fasciculations.  ?Motor: ?Tone is normal. Bulk is normal.  ?LUE 1/5   RUE 5/5 ?LLE 1/5   RLE 5/5 ?Sensory: ?Decreased sensation on the left face, left arm, and left leg ?Cerebellar: ?FNF and HKS intact on the right.  Unable to complete on the left due to weakness ? ? ? ?  ? ?ASSESSMENT/PLAN ?Jacob [name] is a 46 y.o. male with history of HTN, DM2 presenting with left side facial droop, left arm weakness. Received TNKase. Neuro status declined during MRI (NIHSS 13),taken for thrombectomy with Dr. Karenann Cai. TICI3 recanalization of the right M2/MCA achieved.  MRI/MRA shows reoccluded right M2 branch with a right MCA territory infarct.  Continue with PT/OT/SLP evals.  Home blood pressure medications resumed. BP goal less  than 160. Topamax and Tylenol #3 for headache management.Transfer orders placed. CIR eval underway.  ? ?Stroke:  Right MCA due to right MCA occlusion  s/p TNK and mechanical thrombectomy with TICI3 revascularization etiology not certain, but most likely cryptogenic versus intracranial atherosclerosis ?code Stroke CT head No acute abnormality ?CTA head & neck R short segment M1 occlusion, left distal M1 high grade stenosis ?Post IR CT No hemorrhagic complication on flat panel CT. ?Early MRI Tiny acute infarcts in the upper division right MCA cortex. ?MRI right MCA territory infarct involving the cortex, sparing the basal ganglia.  2 mm midline shift.  No hemorrhage ?MRA reoccluded right M2 branch, intracranial atherosclerosis including advanced left M1 stenosis ?2D Echo EF > 75% ?LDL  96 ?HgbA1c 8.1 ?UDS negative ?VTE prophylaxis - SCDs ?No antithrombotic prior to admission, now on ASA 81 mg and Plavix 75 mg ?Therapy recommendations: Inpatient rehab  ?disposition:  Pending ? ?Intracranial stenosis ?CTA head & neck R short segment M1 occlusion, left distal M1 high grade stenosis ?No significant extracranial stenosis ?Most likely related to long standing uncontrolled risk factors. ? ?Hypertension ?Home meds:  Amlodipine 10mg , lisinopril 10mg -resumed ?Stable ?BP goal 160  ?Long-term BP goal normotensive ?Avoid low BP ?PRN labetalol and hydralazine ? ?Hyperlipidemia ?Home meds:  Atorvastatin 20mg  ?LDL 96, goal < 70 ?Put on lipitor 40 ?Continue statin at discharge ? ?Diabetes type II Uncontrolled ?Home meds:  Glipizide, metformin ?HgbA1c 8.1, goal < 7.0 ?CBGs ?SSI with resist scale, Levemir 11 units twice daily ?DM coordinator consulted ? ?Other Stroke Risk Factors ?Obesity, Body mass index is 36.07 kg/m?., BMI >/= 30 associated with increased stroke risk, recommend weight loss, diet and exercise as appropriate  ? ?Dysphagia  ?On dysphagia 2 diet with thin liquids ?Continue SLP ?Encourage p.o. intake ? ?Hospital day # 2 ? ?Patient seen and examined by NP/APP with MD. MD to update note as needed.  ? ?Janine Ores, DNP, FNP-BC ?[facility] ?Pager: [phone] ? ?I have personally obtained history,examined this patient, reviewed notes, independently viewed imaging studies, participated in medical decision making and plan of care.ROS completed by me personally and pertinent positives fully documented  I have made any additions or clarifications directly to the above note. Agree with note above.  Continue mobilization out of bed and ongoing therapy consults.  Transfer to neurology floor bed when available.  Long discussion patient and wife at the bedside and answered questions.This patient is critically ill and at significant risk of neurological worsening, death and care requires constant  monitoring of vital signs, hemodynamics,respiratory and cardiac monitoring, extensive review of multiple databases, frequent neurological assessment, discussion with family, other specialists and medical decision making of high complexity.I have made any additions or clarifications directly to the above note.This critical care time does not reflect procedure time, or teaching time or supervisory time of PA/NP/Med Resident etc but could involve care discussion time. ? I spent 30 minutes of neurocritical care time  in the care of  this patient. ? ?  ? ? ?Jacob Contras, MD ?Medical Director ?[facility] ?Pager: [phone] ?09/14/2021 2:10 PM ? ? ?To contact Stroke Continuity provider, please refer to [email or web address] ?After hours, contact General Neurology ? ?

## 2021-09-15 ENCOUNTER — Inpatient Hospital Stay (HOSPITAL_COMMUNITY): Payer: BC Managed Care – PPO

## 2021-09-15 ENCOUNTER — Other Ambulatory Visit: Payer: Self-pay

## 2021-09-15 ENCOUNTER — Inpatient Hospital Stay (HOSPITAL_COMMUNITY)
Admission: EM | Disposition: A | Discharge status: Nursing Home (Includes ICF) | Payer: Self-pay | Source: Home / Self Care | Attending: Neurology

## 2021-09-15 ENCOUNTER — Inpatient Hospital Stay (HOSPITAL_COMMUNITY): Payer: BC Managed Care – PPO | Admitting: Anesthesiology

## 2021-09-15 ENCOUNTER — Encounter (HOSPITAL_COMMUNITY): Payer: Self-pay | Admitting: Interventional Radiology

## 2021-09-15 DIAGNOSIS — G936 Cerebral edema: Secondary | ICD-10-CM | POA: Diagnosis not present

## 2021-09-15 DIAGNOSIS — I63511 Cerebral infarction due to unspecified occlusion or stenosis of right middle cerebral artery: Secondary | ICD-10-CM | POA: Diagnosis not present

## 2021-09-15 DIAGNOSIS — I63519 Cerebral infarction due to unspecified occlusion or stenosis of unspecified middle cerebral artery: Secondary | ICD-10-CM | POA: Diagnosis present

## 2021-09-15 DIAGNOSIS — R4182 Altered mental status, unspecified: Secondary | ICD-10-CM | POA: Diagnosis not present

## 2021-09-15 HISTORY — PX: CRANIOTOMY: SHX93

## 2021-09-15 LAB — GLUCOSE, CAPILLARY
Glucose-Capillary: 271 mg/dL — ABNORMAL HIGH (ref 70–99)
Glucose-Capillary: 181 mg/dL — ABNORMAL HIGH (ref 70–99)
Glucose-Capillary: 261 mg/dL — ABNORMAL HIGH (ref 70–99)
Glucose-Capillary: 181 mg/dL — ABNORMAL HIGH (ref 70–99)
Glucose-Capillary: 213 mg/dL — ABNORMAL HIGH (ref 70–99)
Glucose-Capillary: 210 mg/dL — ABNORMAL HIGH (ref 70–99)
Glucose-Capillary: 193 mg/dL — ABNORMAL HIGH (ref 70–99)
Glucose-Capillary: 231 mg/dL — ABNORMAL HIGH (ref 70–99)
Glucose-Capillary: 275 mg/dL — ABNORMAL HIGH (ref 70–99)

## 2021-09-15 LAB — POCT I-STAT 7, (LYTES, BLD GAS, ICA,H+H)
Acid-Base Excess: 0 mmol/L (ref 0.0–2.0)
Acid-base deficit: 4 mmol/L — ABNORMAL HIGH (ref 0.0–2.0)
Bicarbonate: 21.7 mmol/L (ref 20.0–28.0)
Bicarbonate: 23.6 mmol/L (ref 20.0–28.0)
Calcium, Ion: 1.12 mmol/L — ABNORMAL LOW (ref 1.15–1.40)
Calcium, Ion: 1.2 mmol/L (ref 1.15–1.40)
HCT: 36 % — ABNORMAL LOW (ref 39.0–52.0)
HCT: 38 % — ABNORMAL LOW (ref 39.0–52.0)
Hemoglobin: 12.2 g/dL — ABNORMAL LOW (ref 13.0–17.0)
Hemoglobin: 12.9 g/dL — ABNORMAL LOW (ref 13.0–17.0)
O2 Saturation: 99 %
O2 Saturation: 100 %
Patient temperature: 98.2
Potassium: 2.8 mmol/L — ABNORMAL LOW (ref 3.5–5.1)
Potassium: 3.7 mmol/L (ref 3.5–5.1)
Sodium: 148 mmol/L — ABNORMAL HIGH (ref 135–145)
Sodium: 146 mmol/L — ABNORMAL HIGH (ref 135–145)
TCO2: 23 mmol/L (ref 22–32)
TCO2: 25 mmol/L (ref 22–32)
pCO2 arterial: 40.4 mmHg (ref 32–48)
pCO2 arterial: 34.1 mmHg (ref 32–48)
pH, Arterial: 7.337 — ABNORMAL LOW (ref 7.35–7.45)
pH, Arterial: 7.447 (ref 7.35–7.45)
pO2, Arterial: 128 mmHg — ABNORMAL HIGH (ref 83–108)
pO2, Arterial: 253 mmHg — ABNORMAL HIGH (ref 83–108)

## 2021-09-15 LAB — TYPE AND SCREEN
ABO/RH(D): AB POS
Antibody Screen: NEGATIVE

## 2021-09-15 LAB — ABO/RH
ABO/RH(D): AB POS
ABO/RH(D): AB POS

## 2021-09-15 LAB — BASIC METABOLIC PANEL
Anion gap: 8 (ref 5–15)
BUN: 16 mg/dL (ref 6–20)
CO2: 25 mmol/L (ref 22–32)
Calcium: 9.1 mg/dL (ref 8.9–10.3)
Chloride: 109 mmol/L (ref 98–111)
Creatinine, Ser: 1.14 mg/dL (ref 0.61–1.24)
GFR, Estimated: >60 mL/min (ref >60)
Glucose, Bld: 196 mg/dL — ABNORMAL HIGH (ref 70–99)
Potassium: 3.3 mmol/L — ABNORMAL LOW (ref 3.5–5.1)
Sodium: 142 mmol/L (ref 135–145)

## 2021-09-15 LAB — CBC
HCT: 43.7 % (ref 39.0–52.0)
Hemoglobin: 15.1 g/dL (ref 13.0–17.0)
MCH: 29.3 pg (ref 26.0–34.0)
MCHC: 34.6 g/dL (ref 30.0–36.0)
MCV: 84.7 fL (ref 80.0–100.0)
Platelets: 145 10*3/uL — ABNORMAL LOW (ref 150–400)
RBC: 5.16 MIL/uL (ref 4.22–5.81)
RDW: 13.1 % (ref 11.5–15.5)
WBC: 11.4 10*3/uL — ABNORMAL HIGH (ref 4.0–10.5)
nRBC: 0 % (ref 0.0–0.2)

## 2021-09-15 LAB — SODIUM
Sodium: 141 mmol/L (ref 135–145)
Sodium: 142 mmol/L (ref 135–145)

## 2021-09-15 LAB — TRIGLYCERIDES: Triglycerides: 106 mg/dL (ref <150)

## 2021-09-15 IMAGING — DX DG CHEST 1V PORT
1 series · 1 of 1 positions shown · non-contrast
Comparison: [DATE].

CLINICAL DATA: Endotracheal tube placement.

EXAM:
PORTABLE CHEST 1 VIEW

[chest]
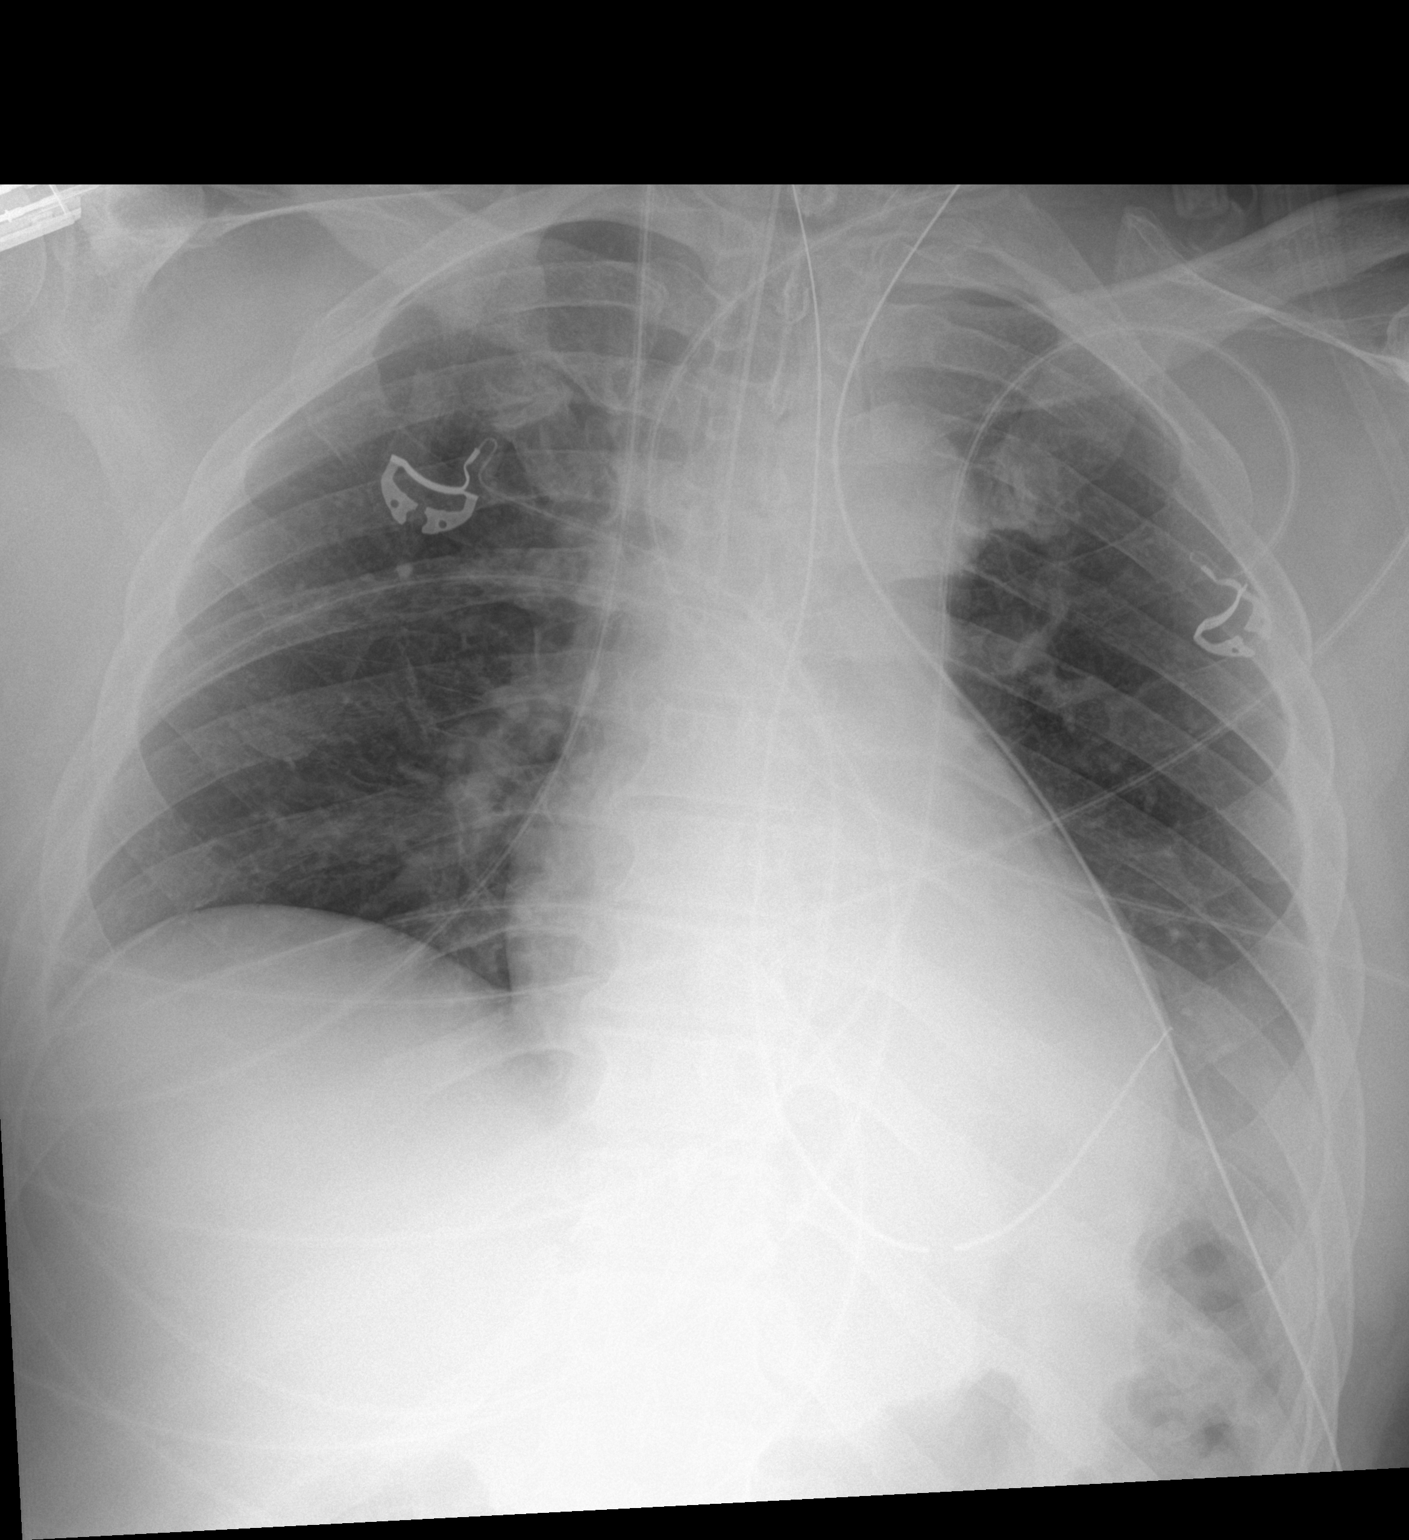

[1 of 1 positions shown; findings below may reference images not displayed]

FINDINGS: The heart size and mediastinal contours are within normal limits.
There is hazy opacification of the left lung base. No pneumothorax
is seen. No acute osseous abnormality. The enteric tube terminates
at the carina and should be retracted approximately 3.5 cm. An
enteric tube courses over the left upper quadrant and likely
terminates in the stomach.
IMPRESSION: 1. Hazy opacification at the left lung base, question small pleural
effusion.
2. The endotracheal tube is at the carina and should be retracted
approximately 3.5 cm.

## 2021-09-15 IMAGING — DX DG CHEST 1V PORT
1 series · 1 of 1 positions shown · non-contrast
Comparison: Chest x-ray [DATE].

CLINICAL DATA: Endotracheal and orogastric tube.

EXAM:
PORTABLE CHEST 1 VIEW

[chest ap]
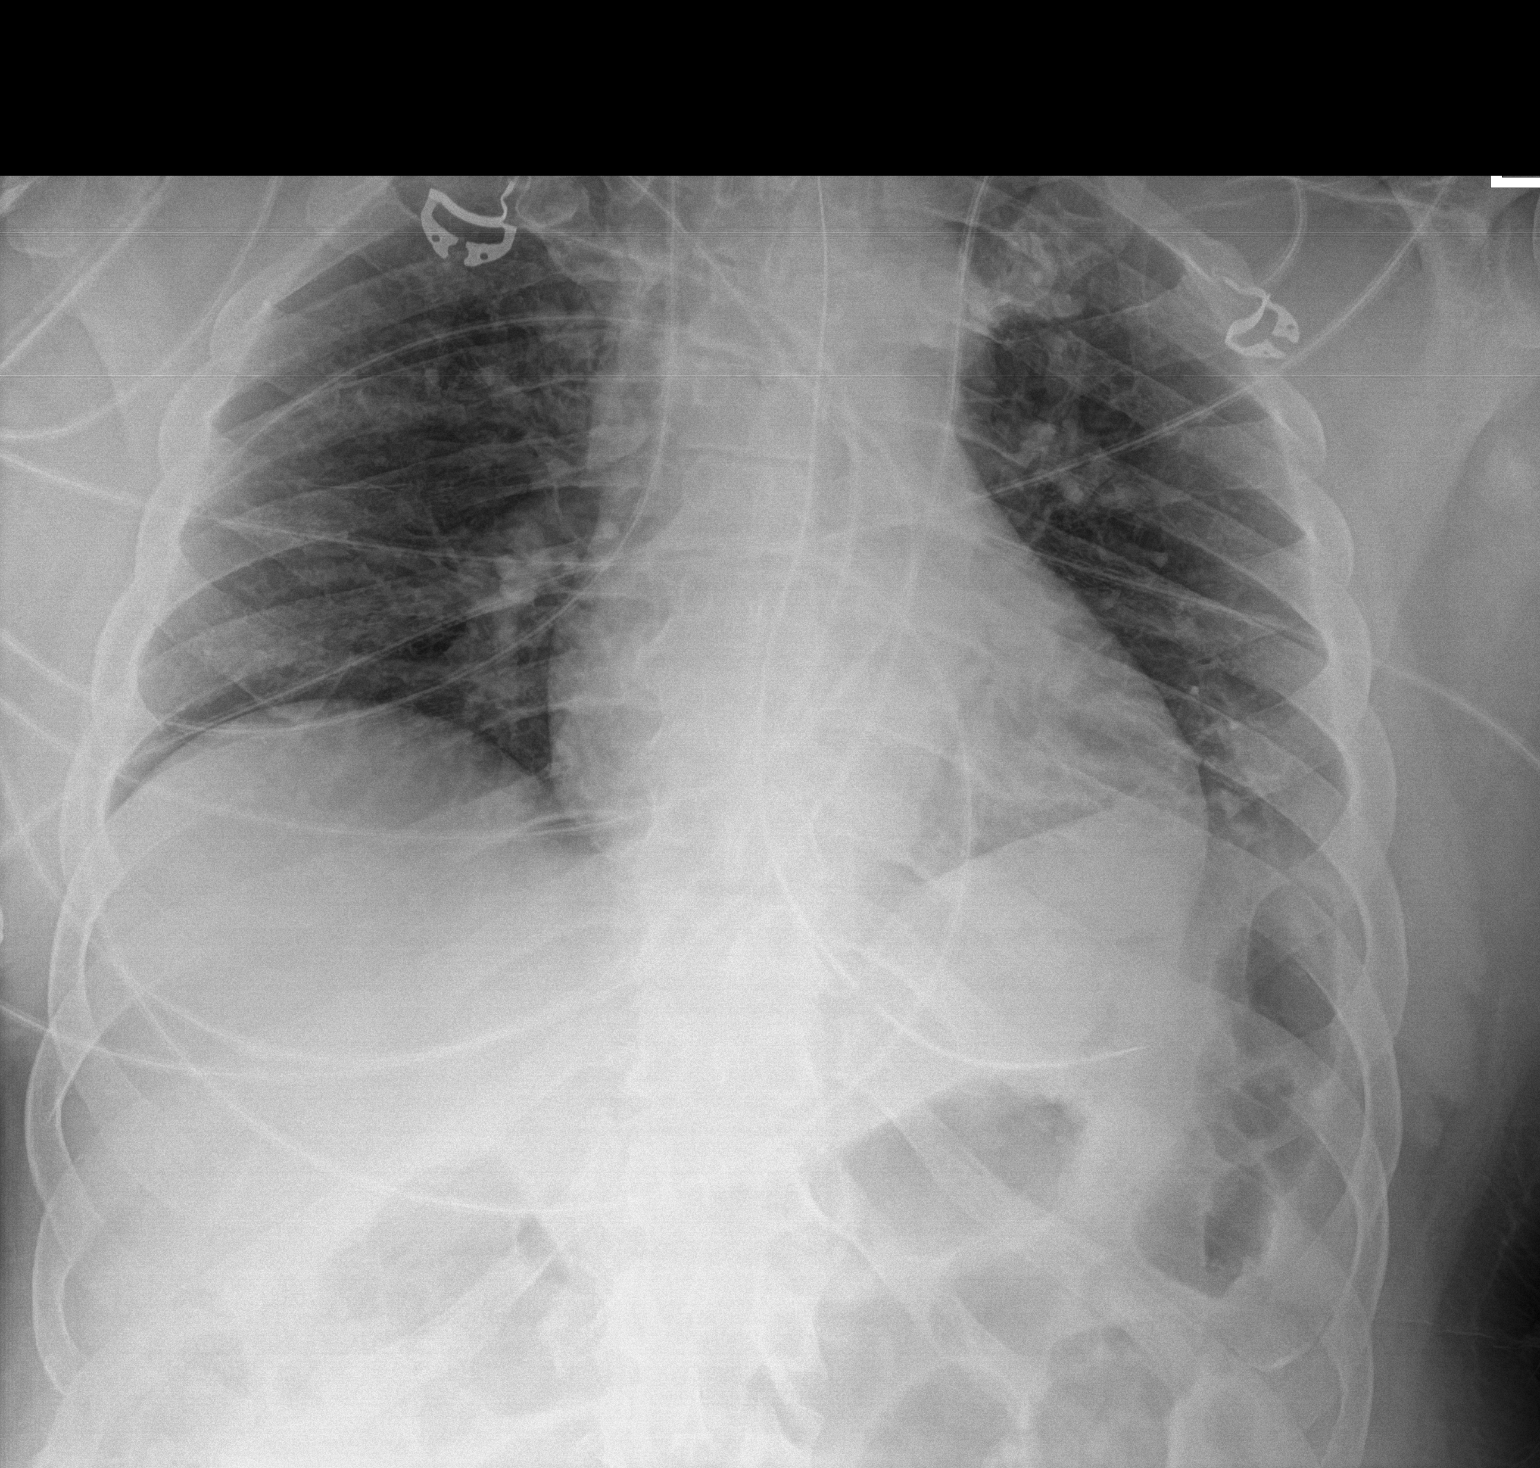

[1 of 1 positions shown; findings below may reference images not displayed]

FINDINGS: The endotracheal tube tip is 4.7 cm above the carina. Enteric tube
tip is in the mid stomach. Cardiomediastinal silhouette is within
normal limits. There is improved aeration in the left lung base.
Small left pleural effusion persists. Right lung is clear. No
pneumothorax or acute fracture.
IMPRESSION: 1. Improved aeration in the left lung base with persistent small
left pleural effusion.
2. The endotracheal tube tip is 4.7 cm above carina.
3. Enteric tube tip is in the mid stomach.

## 2021-09-15 IMAGING — CT CT HEAD W/O CM
4 series · 16 of 47 positions shown, 18 images · non-contrast
Comparison: CT head [DATE], MR head [DATE]

CLINICAL DATA: Stroke follow-up, altered mental status



[Series 3: head bone · axial · 0.51mm/px · z∈[-160,-126]mm · 3 of 85 slices shown]
[im 9/85  bone]
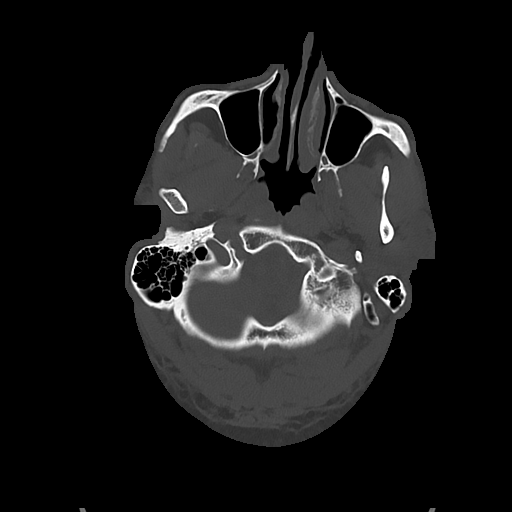
[im 17/85  bone]
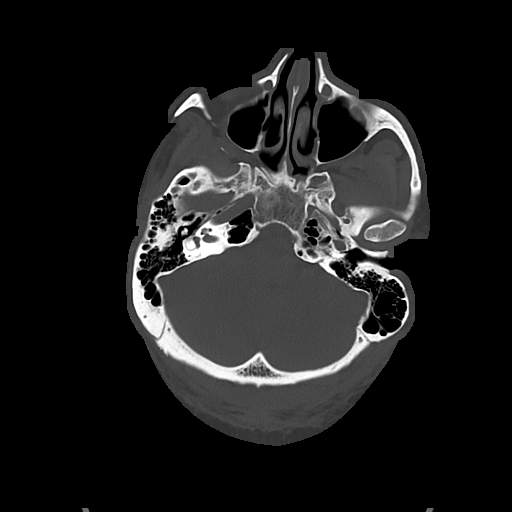
[im 26/85  bone]
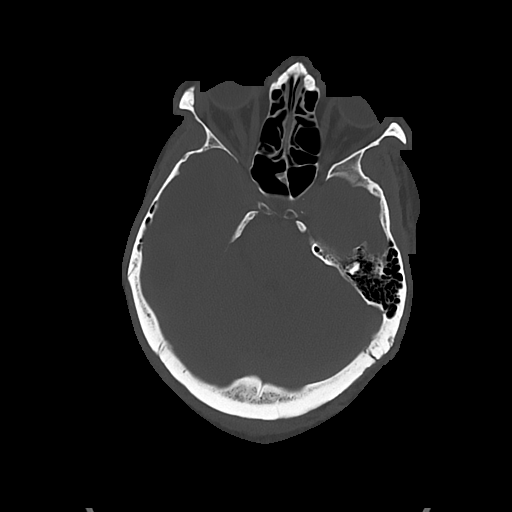

[Series 4: head wo · axial · 0.51mm/px · z∈[-156,-36]mm · 7 of 34 slices shown, 9 images]
[im 5/34  brain]
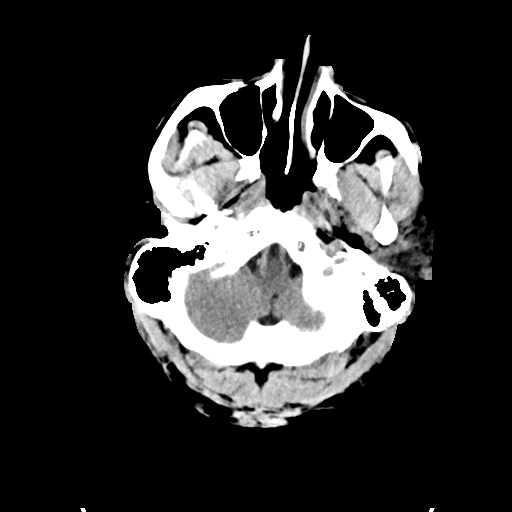
[im 5/34  bone]
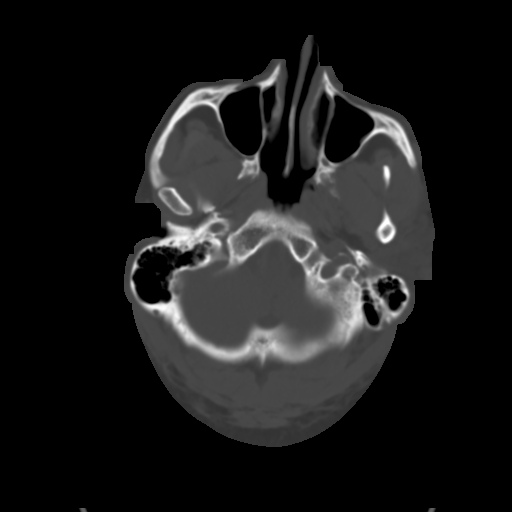
[im 9/34  brain]
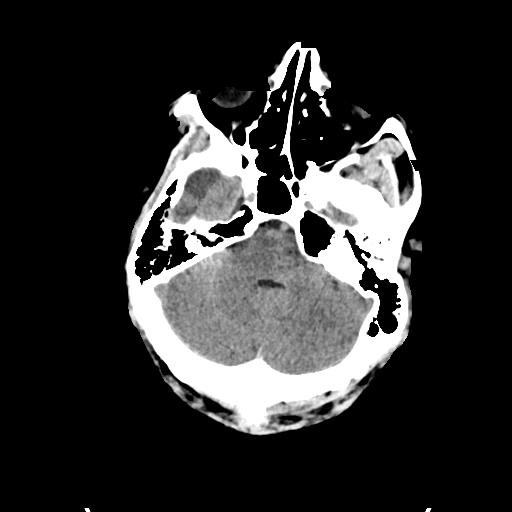
[im 13/34  brain]
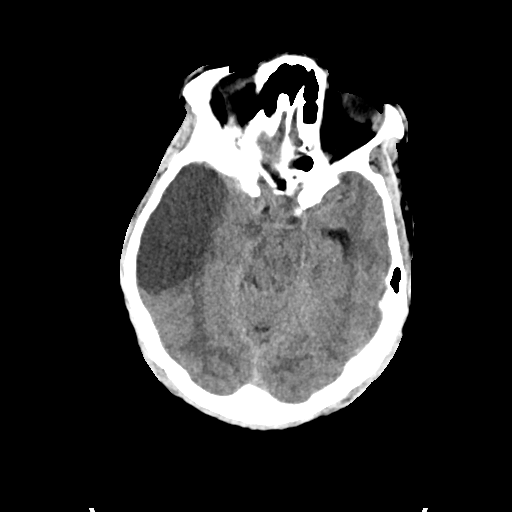
[im 17/34  brain]
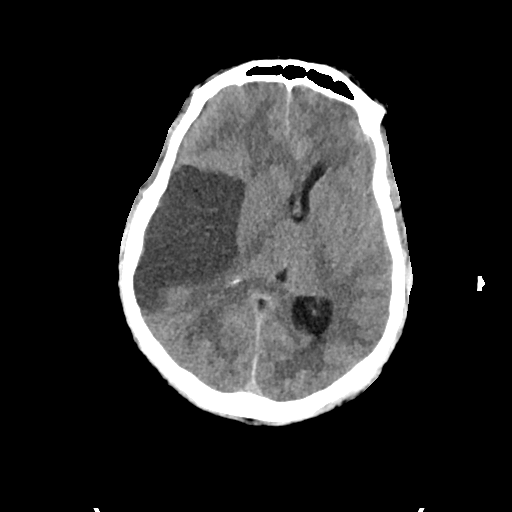
[im 21/34  brain]
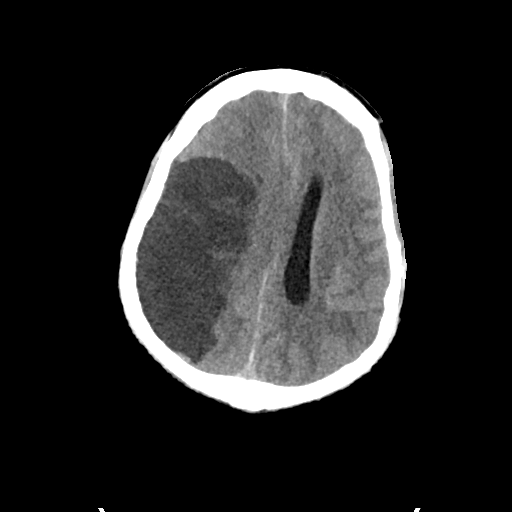
[im 21/34  bone]
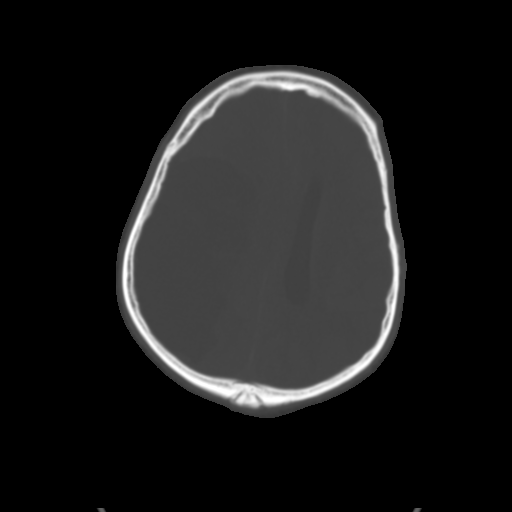
[im 25/34  brain]
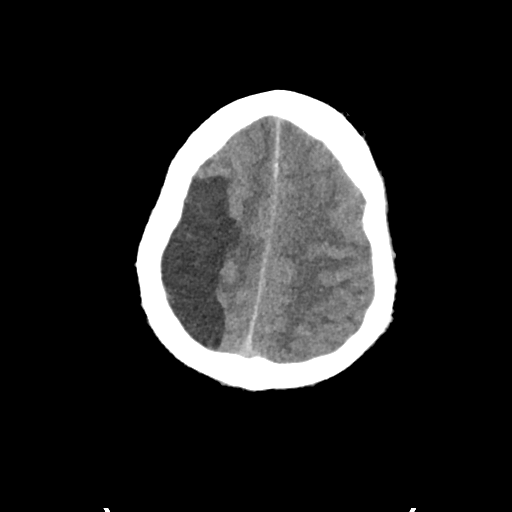
[im 29/34  brain]
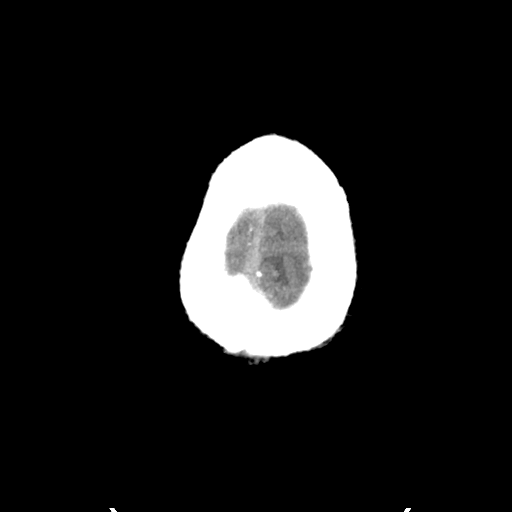

[Series 5: cor soft · coronal · 0.34mm/px · 3 of 77 slices shown]
[im 26/77  brain]
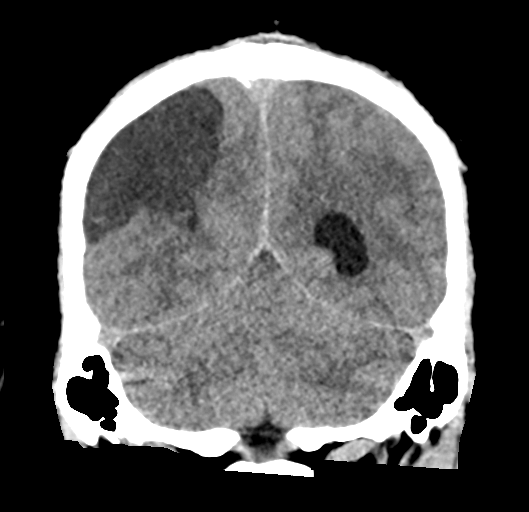
[im 34/77  brain]
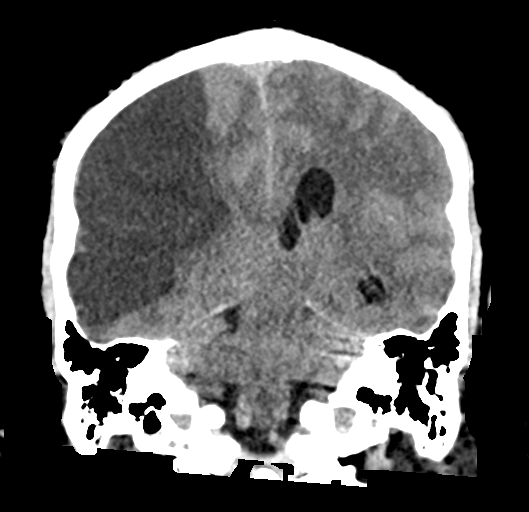
[im 43/77  brain]
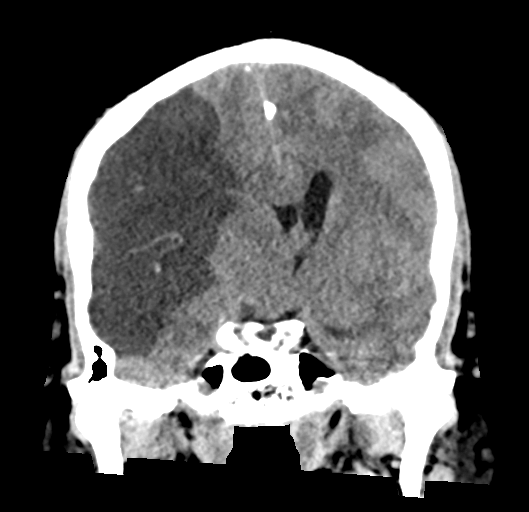

[Series 6: sag soft · sagittal · 0.34mm/px · 3 of 61 slices shown]
[im 21/61  brain]
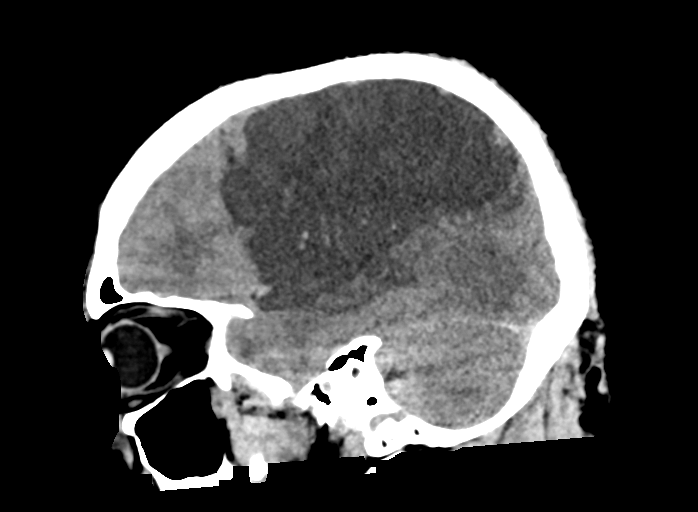
[im 31/61  brain]
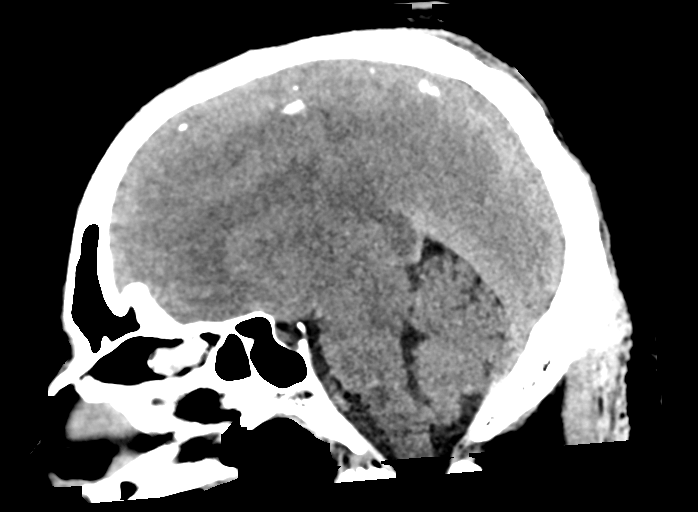
[im 41/61  brain]
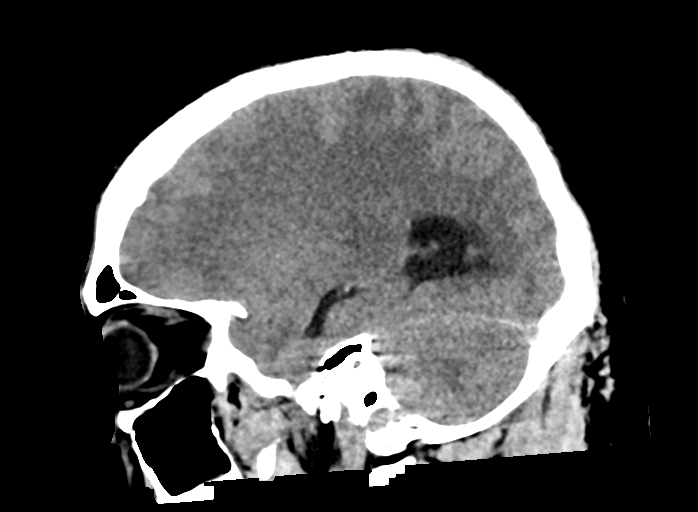

[16 of 47 positions shown; findings below may reference images not displayed]

FINDINGS: Brain: There is ongoing evolution of the large right MCA
distribution infarct which involves right external capsule/insula,
anterior temporal lobe, and much of the frontal and parietal lobe
cortex. There is increased edema resulting in near-complete
effacement of the right lateral ventricle and 12 mm leftward midline
shift, increased from 2 mm on the MRI from 2 days prior. There is
mild enlargement of the right temporal horn raising suspicion for a
trapped ventricle. There is no evidence of hemorrhagic
transformation.

There is no evidence of acute intracranial hemorrhage or extra-axial
fluid collection.

Vascular: No definite dense vessel sign is seen.

Skull: Normal. Negative for fracture or focal lesion.

Sinuses/Orbits: The paranasal sinuses are clear. The globes and
orbits are unremarkable.

Other: None.
IMPRESSION: Ongoing evolution of the large right MCA distribution infarct with
increased edema resulting in near-complete effacement of the right
lateral ventricle and 12 mm leftward midline shift, increased from 2
mm on the brain MRI from 2 days prior. Enlargement of the right
temporal horn raises suspicion for a trapped ventricle. No evidence
of hemorrhagic transformation.

These results were called by telephone at the time of interpretation
on [DATE] at [DATE] to provider Dr LIONEL UJEX, who verbally
acknowledged these results.

## 2021-09-15 SURGERY — CRANIOTOMY HEMATOMA EVACUATION SUBDURAL
Anesthesia: General | Site: Head | Laterality: Right

## 2021-09-15 MED ORDER — PROPOFOL 500 MG/50ML IV EMUL
INTRAVENOUS | Status: DC | PRN
  Administered 2021-09-15: 75 ug/kg/min via INTRAVENOUS

## 2021-09-15 MED ORDER — ROCURONIUM BROMIDE 10 MG/ML (PF) SYRINGE
PREFILLED_SYRINGE | INTRAVENOUS | Status: AC
  Filled 2021-09-15: qty 10

## 2021-09-15 MED ORDER — THROMBIN 5000 UNITS EX SOLR
CUTANEOUS | Status: AC
  Filled 2021-09-15: qty 10000

## 2021-09-15 MED ORDER — LABETALOL HCL 5 MG/ML IV SOLN
10.0000 mg | INTRAVENOUS | Status: DC | PRN
  Administered 2021-09-16 – 2021-09-17 (×4): 20 mg via INTRAVENOUS
  Administered 2021-09-17: 30 mg via INTRAVENOUS
  Administered 2021-09-17 (×3): 20 mg via INTRAVENOUS
  Administered 2021-09-17: 30 mg via INTRAVENOUS
  Administered 2021-09-17: 20 mg via INTRAVENOUS
  Administered 2021-09-17: 30 mg via INTRAVENOUS
  Administered 2021-09-17: 20 mg via INTRAVENOUS
  Filled 2021-09-15 (×7): qty 4
  Filled 2021-09-15: qty 8
  Filled 2021-09-15: qty 4
  Filled 2021-09-15: qty 8
  Filled 2021-09-15 (×3): qty 4

## 2021-09-15 MED ORDER — SUCCINYLCHOLINE CHLORIDE 200 MG/10ML IV SOSY
PREFILLED_SYRINGE | INTRAVENOUS | Status: DC | PRN
  Administered 2021-09-15: 180 mg via INTRAVENOUS

## 2021-09-15 MED ORDER — THROMBIN (RECOMBINANT) 5000 UNITS EX SOLR
CUTANEOUS | Status: DC | PRN
  Administered 2021-09-15: 10 mL via TOPICAL

## 2021-09-15 MED ORDER — LEVETIRACETAM IN NACL 500 MG/100ML IV SOLN
500.0000 mg | Freq: Two times a day (BID) | INTRAVENOUS | Status: DC
  Administered 2021-09-15 – 2021-09-17 (×4): 500 mg via INTRAVENOUS
  Filled 2021-09-15 (×4): qty 100

## 2021-09-15 MED ORDER — FENTANYL CITRATE (PF) 250 MCG/5ML IJ SOLN
INTRAMUSCULAR | Status: DC | PRN
  Administered 2021-09-15: 100 ug via INTRAVENOUS
  Administered 2021-09-15 (×2): 50 ug via INTRAVENOUS
  Administered 2021-09-15 (×3): 100 ug via INTRAVENOUS

## 2021-09-15 MED ORDER — CHLORHEXIDINE GLUCONATE 0.12% ORAL RINSE (MEDLINE KIT)
15.0000 mL | Freq: Two times a day (BID) | OROMUCOSAL | Status: DC
  Administered 2021-09-15 – 2021-10-08 (×45): 15 mL via OROMUCOSAL

## 2021-09-15 MED ORDER — BUPIVACAINE-EPINEPHRINE 0.5% -1:200000 IJ SOLN
INTRAMUSCULAR | Status: DC | PRN
  Administered 2021-09-15 (×2): 10 mL

## 2021-09-15 MED ORDER — ACETAMINOPHEN 650 MG RE SUPP: 650.0000 mg | RECTAL | Status: DC | PRN

## 2021-09-15 MED ORDER — ORAL CARE MOUTH RINSE
15.0000 mL | OROMUCOSAL | Status: DC
  Administered 2021-09-15 – 2021-10-06 (×190): 15 mL via OROMUCOSAL

## 2021-09-15 MED ORDER — THROMBIN 5000 UNITS EX SOLR
OROMUCOSAL | Status: DC | PRN
  Administered 2021-09-15 (×2): 5 mL via TOPICAL

## 2021-09-15 MED ORDER — ONDANSETRON HCL 4 MG PO TABS: 4.0000 mg | ORAL_TABLET | ORAL | Status: DC | PRN

## 2021-09-15 MED ORDER — LIDOCAINE 2% (20 MG/ML) 5 ML SYRINGE
INTRAMUSCULAR | Status: DC | PRN
  Administered 2021-09-15: 100 mg via INTRAVENOUS

## 2021-09-15 MED ORDER — 0.9 % SODIUM CHLORIDE (POUR BTL) OPTIME
TOPICAL | Status: DC | PRN
  Administered 2021-09-15: 5000 mL

## 2021-09-15 MED ORDER — THROMBIN 5000 UNITS EX SOLR
CUTANEOUS | Status: AC
  Filled 2021-09-15: qty 5000

## 2021-09-15 MED ORDER — BUPIVACAINE-EPINEPHRINE 0.5% -1:200000 IJ SOLN
INTRAMUSCULAR | Status: AC
  Filled 2021-09-15: qty 1

## 2021-09-15 MED ORDER — DEXMEDETOMIDINE (PRECEDEX) IN NS 20 MCG/5ML (4 MCG/ML) IV SYRINGE
PREFILLED_SYRINGE | INTRAVENOUS | Status: DC | PRN
Start: 2021-09-15 — End: 2021-09-15
  Administered 2021-09-15: 8 ug via INTRAVENOUS

## 2021-09-15 MED ORDER — ONDANSETRON HCL 4 MG/2ML IJ SOLN: 4.0000 mg | INTRAMUSCULAR | Status: DC | PRN

## 2021-09-15 MED ORDER — SUCCINYLCHOLINE CHLORIDE 200 MG/10ML IV SOSY
PREFILLED_SYRINGE | INTRAVENOUS | Status: AC
  Filled 2021-09-15: qty 10

## 2021-09-15 MED ORDER — PROMETHAZINE HCL 25 MG PO TABS: 12.5000 mg | ORAL_TABLET | ORAL | Status: DC | PRN

## 2021-09-15 MED ORDER — BACITRACIN ZINC 500 UNIT/GM EX OINT
TOPICAL_OINTMENT | CUTANEOUS | Status: DC | PRN
  Administered 2021-09-15: 1 via TOPICAL

## 2021-09-15 MED ORDER — INSULIN DETEMIR 100 UNIT/ML ~~LOC~~ SOLN
15.0000 [IU] | Freq: Two times a day (BID) | SUBCUTANEOUS | Status: DC
  Administered 2021-09-15 – 2021-09-16 (×2): 15 [IU] via SUBCUTANEOUS
  Filled 2021-09-15 (×4): qty 0.15

## 2021-09-15 MED ORDER — ACETAMINOPHEN 325 MG PO TABS: 650.0000 mg | ORAL_TABLET | ORAL | Status: DC | PRN

## 2021-09-15 MED ORDER — SODIUM CHLORIDE 3 % IV SOLN
INTRAVENOUS | Status: DC
Start: 2021-09-15 — End: 2021-09-17
  Filled 2021-09-15: qty 1000
  Filled 2021-09-15 (×2): qty 500
  Filled 2021-09-15: qty 1000
  Filled 2021-09-15 (×4): qty 500

## 2021-09-15 MED ORDER — LISINOPRIL 10 MG PO TABS
10.0000 mg | ORAL_TABLET | Freq: Every day | ORAL | Status: DC
  Administered 2021-09-16 – 2021-09-17 (×2): 10 mg
  Filled 2021-09-15 (×2): qty 1

## 2021-09-15 MED ORDER — POTASSIUM CHLORIDE CRYS ER 20 MEQ PO TBCR: 40.0000 meq | EXTENDED_RELEASE_TABLET | Freq: Once | ORAL | Status: DC

## 2021-09-15 MED ORDER — FENTANYL CITRATE (PF) 250 MCG/5ML IJ SOLN
INTRAMUSCULAR | Status: AC
  Filled 2021-09-15: qty 5

## 2021-09-15 MED ORDER — TOPIRAMATE 25 MG PO TABS
50.0000 mg | ORAL_TABLET | Freq: Two times a day (BID) | ORAL | Status: DC
  Administered 2021-09-16 – 2021-09-21 (×12): 50 mg
  Filled 2021-09-15 (×11): qty 2

## 2021-09-15 MED ORDER — CEFAZOLIN SODIUM-DEXTROSE 2-4 GM/100ML-% IV SOLN
2.0000 g | Freq: Three times a day (TID) | INTRAVENOUS | Status: AC
  Administered 2021-09-15 – 2021-09-17 (×6): 2 g via INTRAVENOUS
  Filled 2021-09-15 (×6): qty 100

## 2021-09-15 MED ORDER — METOPROLOL TARTRATE 5 MG/5ML IV SOLN
INTRAVENOUS | Status: DC | PRN
  Administered 2021-09-15 (×2): 1 mg via INTRAVENOUS

## 2021-09-15 MED ORDER — DEXAMETHASONE SODIUM PHOSPHATE 10 MG/ML IJ SOLN
INTRAMUSCULAR | Status: DC | PRN
  Administered 2021-09-15: 20 mg via INTRAVENOUS

## 2021-09-15 MED ORDER — INSULIN ASPART 100 UNIT/ML IJ SOLN
INTRAMUSCULAR | Status: DC | PRN
  Administered 2021-09-15: 4 [IU] via SUBCUTANEOUS

## 2021-09-15 MED ORDER — PROPOFOL 1000 MG/100ML IV EMUL
5.0000 ug/kg/min | INTRAVENOUS | Status: DC
  Administered 2021-09-15 – 2021-09-16 (×2): 30 ug/kg/min via INTRAVENOUS
  Administered 2021-09-16: 25 ug/kg/min via INTRAVENOUS
  Administered 2021-09-16 (×3): 20 ug/kg/min via INTRAVENOUS
  Administered 2021-09-16: 30 ug/kg/min via INTRAVENOUS
  Administered 2021-09-17 (×2): 20 ug/kg/min via INTRAVENOUS
  Filled 2021-09-15 (×4): qty 100
  Filled 2021-09-15 (×2): qty 200
  Filled 2021-09-15: qty 100

## 2021-09-15 MED ORDER — PROPOFOL 10 MG/ML IV BOLUS
INTRAVENOUS | Status: AC
  Filled 2021-09-15: qty 20

## 2021-09-15 MED ORDER — SODIUM CHLORIDE 0.9 % IV SOLN
40.0000 ug | Freq: Once | INTRAVENOUS | Status: AC
  Administered 2021-09-15: 40 ug via INTRAVENOUS
  Filled 2021-09-15: qty 10

## 2021-09-15 MED ORDER — BACITRACIN ZINC 500 UNIT/GM EX OINT
TOPICAL_OINTMENT | CUTANEOUS | Status: AC
Start: 2021-09-15 — End: ?
  Filled 2021-09-15: qty 28.35

## 2021-09-15 MED ORDER — POTASSIUM CHLORIDE IN NACL 20-0.9 MEQ/L-% IV SOLN: INTRAVENOUS | Status: DC

## 2021-09-15 MED ORDER — DEXAMETHASONE SODIUM PHOSPHATE 10 MG/ML IJ SOLN
INTRAMUSCULAR | Status: AC
  Filled 2021-09-15: qty 2

## 2021-09-15 MED ORDER — SODIUM CHLORIDE 0.9 % IV SOLN: INTRAVENOUS | Status: DC | PRN

## 2021-09-15 MED ORDER — ROCURONIUM BROMIDE 10 MG/ML (PF) SYRINGE
PREFILLED_SYRINGE | INTRAVENOUS | Status: DC | PRN
  Administered 2021-09-15: 20 mg via INTRAVENOUS
  Administered 2021-09-15: 80 mg via INTRAVENOUS

## 2021-09-15 MED ORDER — DEXTROSE 5 % IV SOLN
INTRAVENOUS | Status: DC | PRN
  Administered 2021-09-15: 3 g via INTRAVENOUS

## 2021-09-15 MED ORDER — THROMBIN 5000 UNITS EX SOLR
CUTANEOUS | Status: AC
Start: 2021-09-15 — End: ?
  Filled 2021-09-15: qty 5000

## 2021-09-15 MED ORDER — PANTOPRAZOLE SODIUM 40 MG IV SOLR
40.0000 mg | Freq: Every day | INTRAVENOUS | Status: DC
  Administered 2021-09-16: 40 mg via INTRAVENOUS
  Filled 2021-09-15: qty 10

## 2021-09-15 MED ORDER — PROPOFOL 10 MG/ML IV BOLUS
INTRAVENOUS | Status: DC | PRN
  Administered 2021-09-15 (×2): 150 mg via INTRAVENOUS

## 2021-09-15 MED ORDER — ROCURONIUM BROMIDE 10 MG/ML (PF) SYRINGE
PREFILLED_SYRINGE | INTRAVENOUS | Status: AC
  Filled 2021-09-15: qty 20

## 2021-09-15 MED ORDER — MORPHINE SULFATE (PF) 2 MG/ML IV SOLN: 2.0000 mg | INTRAVENOUS | Status: DC | PRN

## 2021-09-15 MED ORDER — LIDOCAINE 2% (20 MG/ML) 5 ML SYRINGE
INTRAMUSCULAR | Status: AC
  Filled 2021-09-15: qty 5

## 2021-09-15 MED ORDER — ESMOLOL HCL 100 MG/10ML IV SOLN
INTRAVENOUS | Status: DC | PRN
  Administered 2021-09-15: 20 mg via INTRAVENOUS

## 2021-09-15 MED ORDER — SODIUM CHLORIDE 0.9% IV SOLUTION: Freq: Once | INTRAVENOUS | Status: AC

## 2021-09-15 SURGICAL SUPPLY — 63 items
BAG COUNTER SPONGE SURGICOUNT (BAG) ×2 IMPLANT
BAND RUBBER #18 3X1/16 STRL (MISCELLANEOUS) ×4 IMPLANT
BATTERY IQ STERILE (MISCELLANEOUS) IMPLANT
BIT DRILL WIRE PASS 1.3MM (BIT) IMPLANT
BLADE CLIPPER SPEC (BLADE) ×1 IMPLANT
BUR PRECISION FLUTE 6.0 (BURR) ×2 IMPLANT
BUR SPIRAL ROUTER 2.3 (BUR) IMPLANT
CANISTER SUCT 3000ML PPV (MISCELLANEOUS) ×2 IMPLANT
CARTRIDGE OIL MAESTRO DRILL (MISCELLANEOUS) ×1 IMPLANT
CLIP VESOCCLUDE MED 6/CT (CLIP) IMPLANT
COVER BACK TABLE 60X90IN (DRAPES) IMPLANT
DIFFUSER DRILL AIR PNEUMATIC (MISCELLANEOUS) ×2 IMPLANT
DRAIN JACKSON PRATT 10MM FLAT (MISCELLANEOUS) ×1 IMPLANT
DRAPE NEUROLOGICAL W/INCISE (DRAPES) ×2 IMPLANT
DRAPE SURG 17X23 STRL (DRAPES) IMPLANT
DRAPE WARM FLUID 44X44 (DRAPES) ×2 IMPLANT
DRILL WIRE PASS 1.3MM (BIT)
ELECT REM PT RETURN 9FT ADLT (ELECTROSURGICAL) ×2
ELECTRODE REM PT RTRN 9FT ADLT (ELECTROSURGICAL) ×1 IMPLANT
EVACUATOR 1/8 PVC DRAIN (DRAIN) IMPLANT
EVACUATOR SILICONE 100CC (DRAIN) ×1 IMPLANT
GAUZE 4X4 16PLY ~~LOC~~+RFID DBL (SPONGE) ×3 IMPLANT
GAUZE SPONGE 4X4 12PLY STRL (GAUZE/BANDAGES/DRESSINGS) ×2 IMPLANT
GLOVE BIO SURGEON STRL SZ8 (GLOVE) ×2 IMPLANT
GLOVE BIO SURGEON STRL SZ8.5 (GLOVE) ×2 IMPLANT
GLOVE BIOGEL PI IND STRL 7.0 (GLOVE) IMPLANT
GLOVE BIOGEL PI INDICATOR 7.0 (GLOVE) ×1
GLOVE EXAM NITRILE XL STR (GLOVE) IMPLANT
GOWN STRL REUS W/ TWL LRG LVL3 (GOWN DISPOSABLE) IMPLANT
GOWN STRL REUS W/ TWL XL LVL3 (GOWN DISPOSABLE) IMPLANT
GOWN STRL REUS W/TWL LRG LVL3 (GOWN DISPOSABLE) ×4
GOWN STRL REUS W/TWL XL LVL3 (GOWN DISPOSABLE) ×4
HEMOSTAT POWDER KIT SURGIFOAM (HEMOSTASIS) ×2 IMPLANT
KIT BASIN OR (CUSTOM PROCEDURE TRAY) ×2 IMPLANT
KIT TURNOVER KIT B (KITS) ×2 IMPLANT
MARKER SKIN DUAL TIP RULER LAB (MISCELLANEOUS) IMPLANT
NEEDLE HYPO 22GX1.5 SAFETY (NEEDLE) ×2 IMPLANT
NS IRRIG 1000ML POUR BTL (IV SOLUTION) ×6 IMPLANT
OIL CARTRIDGE MAESTRO DRILL (MISCELLANEOUS) ×2
PACK CRANIOTOMY CUSTOM (CUSTOM PROCEDURE TRAY) ×2 IMPLANT
PAD ARMBOARD 7.5X6 YLW CONV (MISCELLANEOUS) ×2 IMPLANT
PATTIES SURGICAL .25X.25 (GAUZE/BANDAGES/DRESSINGS) IMPLANT
PATTIES SURGICAL .5 X.5 (GAUZE/BANDAGES/DRESSINGS) IMPLANT
PATTIES SURGICAL .5 X3 (DISPOSABLE) IMPLANT
PATTIES SURGICAL 1X1 (DISPOSABLE) IMPLANT
PIN MAYFIELD SKULL DISP (PIN) ×1 IMPLANT
SPONGE NEURO XRAY DETECT 1X3 (DISPOSABLE) IMPLANT
SPONGE SURGIFOAM ABS GEL SZ50 (HEMOSTASIS) ×1 IMPLANT
STAPLER SKIN PROX WIDE 3.9 (STAPLE) ×2 IMPLANT
SUT ETHILON 3 0 FSL (SUTURE) IMPLANT
SUT NURALON 4 0 TR CR/8 (SUTURE) ×3 IMPLANT
SUT PROLENE 6 0 BV (SUTURE) IMPLANT
SUT VIC AB 2-0 CP2 18 (SUTURE) ×2 IMPLANT
SUT VIC AB 3-0 FS2 27 (SUTURE) ×2 IMPLANT
SUT VICRYL 4-0 PS2 18IN ABS (SUTURE) IMPLANT
TAPE CLOTH SURG 4X10 WHT LF (GAUZE/BANDAGES/DRESSINGS) ×2 IMPLANT
TIP NONSTICK .5MMX23CM (INSTRUMENTS) ×2
TIP NONSTICK .5X23 (INSTRUMENTS) IMPLANT
TOWEL GREEN STERILE (TOWEL DISPOSABLE) ×2 IMPLANT
TOWEL GREEN STERILE FF (TOWEL DISPOSABLE) ×2 IMPLANT
TRAY FOLEY MTR SLVR 16FR STAT (SET/KITS/TRAYS/PACK) ×1 IMPLANT
UNDERPAD 30X36 HEAVY ABSORB (UNDERPADS AND DIAPERS) IMPLANT
WATER STERILE IRR 1000ML POUR (IV SOLUTION) ×2 IMPLANT

## 2021-09-15 NOTE — Progress Notes (Addendum)
STROKE TEAM PROGRESS NOTE  ? ?INTERVAL HISTORY ?Patient is seen in his room with no family at the bedside.  He was transferred to the floor yesterday but this morning nurse was concerned that he is difficult to arouse and very sleepy hence   head CT head was ordered this morning and shows increased edema and 12 mm leftward midline shift.  With trapping of the temporal horn.  Will transfer him back to the ICU and start 3% HTS to treat edema.  Will consult neurosurgery for potential hemicraniectomy.  Discussed with patient's wife and answered questions ? ?Vitals:  ? 09/14/21 2126 09/14/21 2357 09/15/21 0742 09/15/21 1110  ?BP: (!) 174/92  (!) 154/80 (!) 144/76  ?Pulse: 62  65 62  ?Resp:  (!) 23 19 20   ?Temp: 97.7 ?F (36.5 ?C) 97.7 ?F (36.5 ?C) 99.2 ?F (37.3 ?C)   ?TempSrc: Oral Axillary Axillary   ?SpO2:   97% 99%  ?Weight:      ?Height:      ? ?CBC:  ?Recent Labs  ?Lab 09/12/21 ?0300 09/12/21 ?0305 09/15/21 ?0559  ?WBC 5.5  --  11.4*  ?NEUTROABS 2.1  --   --   ?HGB 14.3 13.6 15.1  ?HCT 40.0 40.0 43.7  ?MCV 83.0  --  84.7  ?PLT 174  --  145*  ? ? ?Basic Metabolic Panel:  ?Recent Labs  ?Lab 09/12/21 ?0300 09/12/21 ?0305 09/15/21 ?0559  ?NA 137 139 142  ?K 3.5 3.6 3.3*  ?CL 103 101 109  ?CO2 26  --  25  ?GLUCOSE 308* 308* 196*  ?BUN 11 13 16   ?CREATININE 1.04 1.00 1.14  ?CALCIUM 9.0  --  9.1  ? ? ?Lipid Panel:  ?Recent Labs  ?Lab 09/12/21 ?0356 09/15/21 ?0559  ?CHOL 191  --   ?TRIG 284* 106  ?HDL 38*  --   ?CHOLHDL 5.0  --   ?VLDL 57*  --   ?Bryan 96  --   ? ? ?HgbA1c:  ?Recent Labs  ?Lab 09/12/21 ?0300  ?HGBA1C 8.1*  ? ? ?Urine Drug Screen:  ?Recent Labs  ?Lab 09/12/21 ?1437  ?LABOPIA NONE DETECTED  ?COCAINSCRNUR NONE DETECTED  ?LABBENZ NONE DETECTED  ?AMPHETMU NONE DETECTED  ?THCU NONE DETECTED  ?LABBARB NONE DETECTED  ? ?  ?Alcohol Level No results for input(s): ETH in the last 168 hours. ? ?IMAGING past 24 hours ?CT HEAD WO CONTRAST (5MM) ? ?Result Date: 09/15/2021 ?CLINICAL DATA:  Stroke follow-up, altered mental  status EXAM: CT HEAD WITHOUT CONTRAST TECHNIQUE: Contiguous axial images were obtained from the base of the skull through the vertex without intravenous contrast. RADIATION DOSE REDUCTION: This exam was performed according to the departmental dose-optimization program which includes automated exposure control, adjustment of the mA and/or kV according to patient size and/or use of iterative reconstruction technique. COMPARISON:  CT head 09/12/2021, MR head 09/13/2021 FINDINGS: Brain: There is ongoing evolution of the large right MCA distribution infarct which involves right external capsule/insula, anterior temporal lobe, and much of the frontal and parietal lobe cortex. There is increased edema resulting in near-complete effacement of the right lateral ventricle and 12 mm leftward midline shift, increased from 2 mm on the MRI from 2 days prior. There is mild enlargement of the right temporal horn raising suspicion for a trapped ventricle. There is no evidence of hemorrhagic transformation. There is no evidence of acute intracranial hemorrhage or extra-axial fluid collection. Vascular: No definite dense vessel sign is seen. Skull: Normal. Negative for fracture or focal  lesion. Sinuses/Orbits: The paranasal sinuses are clear. The globes and orbits are unremarkable. Other: None. IMPRESSION: Ongoing evolution of the large right MCA distribution infarct with increased edema resulting in near-complete effacement of the right lateral ventricle and 12 mm leftward midline shift, increased from 2 mm on the brain MRI from 2 days prior. Enlargement of the right temporal horn raises suspicion for a trapped ventricle. No evidence of hemorrhagic transformation. These results were called by telephone at the time of interpretation on 09/15/2021 at 10:23 am to provider Dr Leonie Man, who verbally acknowledged these results. Electronically Signed   By: Valetta Mole M.D.   On: 09/15/2021 10:34   ? ?PHYSICAL EXAM ? ?Physical Exam   ?Constitutional: Appears well-developed and well-nourished middle-age African-American male.  ?Cardiovascular: Normal rate and regular rhythm.  ?Respiratory: Effort normal, non-labored breathing ? ?Neuro: ?Mental Status: ?Patient is drowsy, can be aroused with some difficulty.  But unable to open eyes. ?Nonverbal but follows commands and gestures appropriately.  No neglect.  Pupils are equal and reactive ?Cranial Nerves: ?II:  Pupils are equal, round, and reactive to light.   ?III,IV, VI: Dysconjugate gaze L eye abduction.  Right gaze preference and unable to look to the left all the way ?V: Facial sensation is symmetric to temperature ?VII: Left facial droop ?VIII: Hearing is intact to voice ?XI: Head is midline ?XII: Tongue protrudes midline without atrophy or fasciculations.  ?Motor: ?Tone is normal. Bulk is normal.  ?LUE 0/5   RUE 5/5 ?LLE 0/5   RLE 5/5 ?Sensory: ?Difficult to assess as patient is nonverbal ? ? ?ASSESSMENT/PLAN ?Mr. RUPINDER THERRELL is a 46 y.o. male with history of HTN, DM2 presenting with left side facial droop, left arm weakness. Received TNKase. Neuro status declined during MRI (NIHSS 13),taken for thrombectomy with Dr. Karenann Cai. TICI3 recanalization of the right M2/MCA achieved.  MRI/MRA shows reoccluded right M2 branch with a right MCA territory infarct.  Continue with PT/OT/SLP evals.  Home blood pressure medications resumed. BP goal less than 160. Topamax and Tylenol #3 for headache management.  Head CT this AM shows new 18mm leftward midline shift.  Will transfer back to ICU and start HTS. ? ?Stroke:  Right MCA due to right MCA occlusion  s/p TNK and mechanical thrombectomy with TICI3 revascularization etiology not certain, but most likely cryptogenic versus intracranial atherosclerosis..  Repeat CT scan/24/23 shows malignant cerebral edema with 12 mm right to left midline shift and trapping of the temporal horn ?code Stroke CT head No acute abnormality ?CTA head & neck R  short segment M1 occlusion, left distal M1 high grade stenosis ?Post IR CT No hemorrhagic complication on flat panel CT. ?Early MRI Tiny acute infarcts in the upper division right MCA cortex. ?MRI right MCA territory infarct involving the cortex, sparing the basal ganglia.  2 mm midline shift.  No hemorrhage ?MRA reoccluded right M2 branch, intracranial atherosclerosis including advanced left M1 stenosis ?2D Echo EF > 75% ?LDL 96 ?HgbA1c 8.1 ?UDS negative ?VTE prophylaxis - SCDs ?No antithrombotic prior to admission, now on ASA 81 mg and Plavix 75 mg ?Therapy recommendations: Inpatient rehab  ?disposition:  Pending ? ?Cerebral Edema (brain compression) ?Head CT this morning shows increased edema with new 54mm midline shift ?Will move back to ICU ?3% HTS to treat edema ?Neurosurgery consult ? ?Intracranial stenosis ?CTA head & neck R short segment M1 occlusion, left distal M1 high grade stenosis ?No significant extracranial stenosis ?Most likely related to long standing uncontrolled risk factors. ? ?  Hypertension ?Home meds:  Amlodipine 10mg , lisinopril 10mg -resumed ?Stable ?BP goal 160  ?Long-term BP goal normotensive ?Avoid low BP ?PRN labetalol and hydralazine ? ?Hyperlipidemia ?Home meds:  Atorvastatin 20mg  ?LDL 96, goal < 70 ?Put on lipitor 40 ?Continue statin at discharge ? ?Diabetes type II Uncontrolled ?Home meds:  Glipizide, metformin ?HgbA1c 8.1, goal < 7.0 ?CBGs ?SSI with resist scale, Levemir 11 units twice daily ?DM coordinator consulted ? ?Other Stroke Risk Factors ?Obesity, Body mass index is 36.07 kg/m?., BMI >/= 30 associated with increased stroke risk, recommend weight loss, diet and exercise as appropriate  ? ?Dysphagia  ?On dysphagia 2 diet with thin liquids ?Continue SLP ?Encourage p.o. intake ? ?Hospital day # 3 ? ?Patient seen and examined by NP/APP with MD. MD to update note as needed.  ? ?Enders , MSN, AGACNP-BC ?[facility] ?See Amion for schedule and pager  information ?09/15/2021 12:35 PM ?  ?STROKE MD NOTE :  ?I have personally obtained history,examined this patient, reviewed notes, independently viewed imaging studies, participated in medical decision making and plan of care.

## 2021-09-15 NOTE — Anesthesia Postprocedure Evaluation (Signed)
Anesthesia Post Note ? ?Patient: Jacob [name] ? ?Procedure(s) Performed: RIGHT DECOMPRESSIVE CRANIOTOMY, PLACEMENT OF BONE FLAP IN ABDOMEN (Right: Head) ? ?  ? ?Patient location during evaluation: SICU ?Anesthesia Type: General ?Level of consciousness: sedated ?Pain management: pain level controlled ?Vital Signs Assessment: post-procedure vital signs reviewed and stable ?Respiratory status: patient remains intubated per anesthesia plan ?Cardiovascular status: stable ?Postop Assessment: no apparent nausea or vomiting ?Anesthetic complications: no ? ? ?No notable events documented. ? ?Last Vitals:  ?Vitals:  ? 09/15/21 1630 09/15/21 1915  ?BP: (!) 156/83   ?Pulse: 87 (!) 115  ?Resp: (!) 23 19  ?Temp:    ?SpO2: 95% 98%  ?  ?Last Pain:  ?Vitals:  ? 09/15/21 1613  ?TempSrc: Oral  ?PainSc:   ? ? ?  ?  ?  ?  ?  ?  ? ?Gonsalo Cuthbertson P Briel Gallicchio ? ? ? ? ?

## 2021-09-15 NOTE — Progress Notes (Signed)
OT Cancellation Note ? ?Patient Details ?Name: Jacob [name] ?MRN: [name] ?DOB: August 10, 1975 ? ? ?Cancelled Treatment:    Reason Eval/Treat Not Completed: Medical issues which prohibited therapy (Patient being transferred to ICU due to medical decline. Treatment on hold per RN) ?Lodema Hong, OTA ?Acute Rehabilitation Services  ?Pager [phone] ?Office [phone] ? ?[place] ?09/15/2021, 11:49 AM ?

## 2021-09-15 NOTE — Assessment & Plan Note (Addendum)
Suboptimal blood sugar control on sliding scale insulin. On no steroids.  ? ?-Maintain euglycemia to limit cerebral edema. ?-Increase basal insulin coverage further.Marland Kitchen  ?

## 2021-09-15 NOTE — Progress Notes (Signed)
SBP >160. Dr. Lynetta Mare notified. Updated Cleviprex orders received and will administer ?

## 2021-09-15 NOTE — Progress Notes (Signed)
? ? ?Referring Physician(s): ?Stroke Team  ? ?Supervising Physician: Pedro Earls ? ?Patient Status:  [facility] - In-pt ? ?Chief Complaint: ? ?Code stroke s/p treatment of M2/MCA  posterior division branch by Dr. Raliegh Ip. Karenann Cai ? ? ?Subjective: ? ?Unable to assess due to patient condition.  Patient not responding to verbal or physical stimuli. Stat Head CT order results pending. ? ?Allergies: ?Hydrochlorothiazide ? ?Medications: ?Prior to Admission medications   ?Medication Sig Start Date End Date Taking? Authorizing Provider  ?clotrimazole (LOTRIMIN) 1 % cream Apply 1 application. topically daily as needed (foot itching).   Yes [provider]  ?ibuprofen (ADVIL) 200 MG tablet Take 400 mg by mouth every 6 (six) hours as needed for headache (pain).   Yes [provider]  ?amLODipine (NORVASC) 10 MG tablet Take 1 tablet (10 mg total) by mouth daily. ?Patient taking differently: Take 10 mg by mouth every evening. 04/08/21   Charlott Rakes, MD  ?atorvastatin (LIPITOR) 20 MG tablet Take 1 tablet (20 mg total) by mouth daily. ?Patient not taking: Reported on 09/12/2021 04/08/21   Charlott Rakes, MD  ?Blood Glucose Monitoring Suppl (TRUE METRIX METER) w/Device KIT USE AS DIRECTED 12/04/20   Charlott Rakes, MD  ?glipiZIDE (GLUCOTROL) 10 MG tablet Take 1 tablet (10 mg total) by mouth 2 (two) times daily before a meal. ?Patient not taking: Reported on 09/12/2021 04/08/21   Charlott Rakes, MD  ?glucose blood (TRUE METRIX BLOOD GLUCOSE TEST) test strip Use three times daily 10/31/20   Mayers, Cari S, PA-C  ?lisinopril (ZESTRIL) 10 MG tablet Take 1 tablet (10 mg total) by mouth daily. ?Patient not taking: Reported on 09/12/2021 09/10/21   Charlott Rakes, MD  ?metFORMIN (GLUCOPHAGE) 500 MG tablet Take 2 tablets (1,000 mg total) by mouth 2 (two) times daily with a meal. ?Patient not taking: Reported on 09/12/2021 01/06/21   Charlott Rakes, MD  ?TRUEplus Lancets 28G MISC use 3 (three) times daily  before meals. 10/31/20   Mayers, Loraine Grip, PA-C  ?Vitamin D, Ergocalciferol, (DRISDOL) 1.25 MG (50000 UNIT) CAPS capsule Take 1 capsule (50,000 Units total) by mouth every 7 (seven) days. ?Patient not taking: Reported on 09/12/2021 11/04/20   Mayers, Loraine Grip, PA-C  ? ? ? ?Vital Signs: ?BP (!) 154/80 (BP Location: Right Arm)   Pulse 65   Temp 99.2 ?F (37.3 ?C) (Axillary)   Resp 19   Ht _0  (1.854 m)   Wt 273 lb 5.9 oz (124 kg)   SpO2 97%   BMI 36.07 kg/m?  ? ?Physical Exam ?Cardiovascular:  ?   Rate and Rhythm: Regular rhythm. Bradycardia present.  ?   Comments: HR - 56-62 ?Pulmonary:  ?   Effort: Pulmonary effort is normal.  ?Neurological:  ?   Comments: Not responding to verbal and physical stimuli.  ?Left sided facial droop noted.  ?Right leg moves spontaneously.   ? ? ?Imaging: ?CT HEAD WO CONTRAST (5MM) ? ?Result Date: 09/12/2021 ?CLINICAL DATA:  Stroke follow-up.  Left arm and leg drift. EXAM: CT HEAD WITHOUT CONTRAST TECHNIQUE: Contiguous axial images were obtained from the base of the skull through the vertex without intravenous contrast. RADIATION DOSE REDUCTION: This exam was performed according to the departmental dose-optimization program which includes automated exposure control, adjustment of the mA and/or kV according to patient size and/or use of iterative reconstruction technique. COMPARISON:  Head CT and CTA from earlier today FINDINGS: Brain: No evidence of acute infarction, hemorrhage, hydrocephalus, extra-axial collection or mass lesion/mass effect.  Vascular: No hyperdense vessel or unexpected calcification. Skull: Normal. Negative for fracture or focal lesion. Sinuses/Orbits: No acute finding. IMPRESSION: No hemorrhage or detected infarct. Electronically Signed   By: Jorje Guild M.D.   On: 09/12/2021 05:02  ? ?MR ANGIO HEAD WO CONTRAST ? ?Result Date: 09/13/2021 ?CLINICAL DATA:  Stroke follow-up.  24 hours post thrombolytics EXAM: MRI HEAD WITHOUT CONTRAST MRA HEAD WITHOUT CONTRAST  TECHNIQUE: Multiplanar, multi-echo pulse sequences of the brain and surrounding structures were acquired without intravenous contrast. Angiographic images of the Circle of Willis were acquired using MRA technique without intravenous contrast. COMPARISON:  CT, CTA, and MRA the preceding day. FINDINGS: MRI HEAD FINDINGS Brain: Progressive, confluent right MCA territory infarct diffusely involving the cortex and sparing the basal ganglia. Swelling causes 2 mm of midline shift measured at the septum pellucidum. No hemorrhage. Tubular gradient hypointensity along the affected right cerebral hemisphere is intravascular. No pre-existing infarct, hydrocephalus, or mass Vascular: See below Skull and upper cervical spine: Normal marrow signal Sinuses/Orbits: Negative Other: Intermittent motion artifact. MRA HEAD FINDINGS Anterior circulation: Atheromatous type irregularity of the carotid siphons. More proximal ICA irregularity attributed to skull base artifact. Reocclusion of the more inferior right M2 branch. Advanced stenosis of the distal left M1 segment. Atheromatous irregularity of medium size vessels, fairly extensive and greater than expected for age Posterior circulation: Patent vertebral and basilar arteries with mild-to-moderate right vertebrobasilar junction stenosis. Atheromatous irregularity of bilateral PCA, greater on the left, without proximal occlusion. Negative for aneurysm IMPRESSION: Brain MRI: Progression to large right MCA territory infarct. Swelling causes 2 mm of midline shift. Intracranial MRA: 1. Reoccluded right M2 branch. 2. Intracranial atherosclerosis including advanced left M1 stenosis. Electronically Signed   By: Jorje Guild M.D.   On: 09/13/2021 04:25  ? ?MR BRAIN WO CONTRAST ? ?Result Date: 09/13/2021 ?CLINICAL DATA:  Stroke follow-up.  24 hours post thrombolytics EXAM: MRI HEAD WITHOUT CONTRAST MRA HEAD WITHOUT CONTRAST TECHNIQUE: Multiplanar, multi-echo pulse sequences of the brain and  surrounding structures were acquired without intravenous contrast. Angiographic images of the Circle of Willis were acquired using MRA technique without intravenous contrast. COMPARISON:  CT, CTA, and MRA the preceding day. FINDINGS: MRI HEAD FINDINGS Brain: Progressive, confluent right MCA territory infarct diffusely involving the cortex and sparing the basal ganglia. Swelling causes 2 mm of midline shift measured at the septum pellucidum. No hemorrhage. Tubular gradient hypointensity along the affected right cerebral hemisphere is intravascular. No pre-existing infarct, hydrocephalus, or mass Vascular: See below Skull and upper cervical spine: Normal marrow signal Sinuses/Orbits: Negative Other: Intermittent motion artifact. MRA HEAD FINDINGS Anterior circulation: Atheromatous type irregularity of the carotid siphons. More proximal ICA irregularity attributed to skull base artifact. Reocclusion of the more inferior right M2 branch. Advanced stenosis of the distal left M1 segment. Atheromatous irregularity of medium size vessels, fairly extensive and greater than expected for age Posterior circulation: Patent vertebral and basilar arteries with mild-to-moderate right vertebrobasilar junction stenosis. Atheromatous irregularity of bilateral PCA, greater on the left, without proximal occlusion. Negative for aneurysm IMPRESSION: Brain MRI: Progression to large right MCA territory infarct. Swelling causes 2 mm of midline shift. Intracranial MRA: 1. Reoccluded right M2 branch. 2. Intracranial atherosclerosis including advanced left M1 stenosis. Electronically Signed   By: Jorje Guild M.D.   On: 09/13/2021 04:25  ? ?MR BRAIN WO CONTRAST ? ?Result Date: 09/12/2021 ?CLINICAL DATA:  Stroke follow-up.  Left facial droop. EXAM: MRI HEAD WITHOUT CONTRAST TECHNIQUE: Multiplanar, multiecho pulse sequences of the brain and surrounding  structures were obtained without intravenous contrast. COMPARISON:  Head CT from earlier  today FINDINGS: Brain: Small areas of restricted diffusion along the right upper frontal cortex and to a lesser extent in the subjacent white matter. No acute hemorrhage, hydrocephalus, mass, or collection. No pre-existi

## 2021-09-15 NOTE — Progress Notes (Signed)
eLink Physician-Brief Progress Note ?Patient Name: Jacob [name] ?DOB: April 18, 1976 ?MRN: [id number] ? ? ?Date of Service ? 09/15/2021  ?HPI/Events of Note ? Patient returned from the OR and needs an order for Propofol gtt.  ?eICU Interventions ? Propofol gtt ordered.  ? ? ? ?  ? ?Kerry Kass Samad Thon ?09/15/2021, 8:12 PM ?

## 2021-09-15 NOTE — Progress Notes (Signed)
PT Cancellation Note ? ?Patient Details ?Name: Jacob [name] ?MRN: [name] ?DOB: [birth year] ? ? ?Cancelled Treatment:    Reason Eval/Treat Not Completed: Medical issues which prohibited therapy.  Transferring to ICU with status decline.  Hold today per RN. ? ?09/15/2021 ? ?Ginger Carne., PT ?Acute Rehabilitation Services ?[phone]  (pager) ?[phone]  (office) ? ? ?Tessie Fass Havah Ammon ?09/15/2021, 11:37 AM ?

## 2021-09-15 NOTE — Progress Notes (Signed)
Inpatient Diabetes Program Recommendations ? ?AACE/ADA: New Consensus Statement on Inpatient Glycemic Control (2015) ? ?Target Ranges:  Prepandial:   less than 140 mg/dL ?     Peak postprandial:   less than 180 mg/dL (1-2 hours) ?     Critically ill patients:  140 - 180 mg/dL  ? ?Lab Results  ?Component Value Date  ? GLUCAP 261 (H) 09/15/2021  ? HGBA1C 8.1 (H) 09/12/2021  ? ? ?Review of Glycemic Control ? Latest Reference Range & Units 09/14/21 07:58 09/14/21 11:52 09/14/21 15:50 09/14/21 20:20 09/14/21 22:45 09/15/21 04:05 09/15/21 07:39  ?Glucose-Capillary 70 - 99 mg/dL 148 (H) 158 (H) 163 (H) 271 (H) 238 (H) 181 (H) 261 (H)  ? ?Diabetes history: DM 2 ?Outpatient Diabetes medications:  ?Glucotrol 10 mg bid, Metformin 1000 mg bid ?Current orders for Inpatient glycemic control:  ?Levemir 12 units bid ?Novolog resistant q 4 hours ?Inpatient Diabetes Program Recommendations:   ? ?-  Please increase Levemir to 15 units bid.   ? ?Will see patient this admission to discuss home DM management and A1C. Pt drowsy at this time.  ? ?Thanks,  ?Tama Headings RN, MSN, BC-ADM ?Inpatient Diabetes Coordinator ?Team Pager [phone] (8a-5p) ?

## 2021-09-15 NOTE — Progress Notes (Signed)
Inpatient Rehab Admissions Coordinator:  ?Pt had a medical status decline. Will continue to follow medical workup and progress with therapies. ? ? ?Gayland Curry, MS, CCC-SLP ?Admissions Coordinator ?[phone] ? ?

## 2021-09-15 NOTE — Progress Notes (Signed)
eLink Physician-Brief Progress Note ?Patient Name: Jacob [name] ?DOB: 1975/07/11 ?MRN: [id number] ? ? ?Date of Service ? 09/15/2021  ?HPI/Events of Note ? ET Tube is at the carina. NG tube maybe curled up in the stomach with the tip approaching the GE junction.  ?eICU Interventions ? RT instructed to withdraw the ET tube 3 cm,  CXR and KUB ordered post-withdrawal.  ? ? ? ?  ? ?Kerry Kass Cathlin Buchan ?09/15/2021, 10:38 PM ?

## 2021-09-15 NOTE — Progress Notes (Signed)
RT Note:  ETT retracted to 24cm at lip per order. ?

## 2021-09-15 NOTE — Anesthesia Procedure Notes (Signed)
Procedure Name: Intubation ?Date/Time: 09/15/2021 5:03 PM ?Performed by: Rande Brunt, CRNA ?Pre-anesthesia Checklist: Patient identified, Emergency Drugs available, Suction available and Patient being monitored ?Patient Re-evaluated:Patient Re-evaluated prior to induction ?Oxygen Delivery Method: Circle System Utilized ?Preoxygenation: Pre-oxygenation with 100% oxygen ?Induction Type: Rapid sequence and Cricoid Pressure applied ?Laryngoscope Size: Mac and 4 ?Grade View: Grade III ?Tube type: Oral ?Tube size: 7.5 mm ?Number of attempts: 1 ?Airway Equipment and Method: Stylet and Oral airway ?Placement Confirmation: ETT inserted through vocal cords under direct vision, positive ETCO2 and breath sounds checked- equal and bilateral ?Secured at: 23 cm ?Tube secured with: Tape ?Dental Injury: Teeth and Oropharynx as per pre-operative assessment  ?Comments: No movement of neck  ? ? ? ? ?

## 2021-09-15 NOTE — Anesthesia Procedure Notes (Signed)
Arterial Line Insertion ?Start/End4/24/2023 5:00 PM, 09/15/2021 5:05 PM ?Performed by: Merlinda Frederick, MD, anesthesiologist ? Preanesthetic checklist: patient identified, risks and benefits discussed and surgical consent ?Lidocaine 1% used for infiltration ?Left, radial was placed ?Catheter size: 20 G ?Hand hygiene performed  and Seldinger technique used ? ?Attempts: 1 ?Procedure performed without using ultrasound guided technique. ?Following insertion, dressing applied and Biopatch. ?Post procedure assessment: normal ? ?Patient tolerated the procedure well with no immediate complications. ? ? ?

## 2021-09-15 NOTE — Anesthesia Preprocedure Evaluation (Addendum)
Anesthesia Evaluation  ?Patient identified by MRN, date of birth, ID band ? ?Reviewed: ?Allergy & Precautions, Patient's Chart, lab work & pertinent test results, Unable to perform ROS - Chart review only ? ?Airway ?Mallampati: Unable to assess ? ? ? ? ? ?Comment: Patient unable to cooperate with airway exam Dental ?no notable dental hx. ? ?  ?Pulmonary ?neg pulmonary ROS,  ?  ? ?+ decreased breath sounds ? ? ? ? ? Cardiovascular ?hypertension,  ?Rhythm:Regular Rate:Tachycardia ? ?Echo 08/2021 ??1. Left ventricular ejection fraction, by estimation, is >75%. The left ventricle has hyperdynamic function. The left ventricle has no regional wall motion abnormalities. There is mild left ventricular hypertrophy. Left ventricular diastolic parameters are consistent with Grade I diastolic dysfunction (impaired relaxation).  ??2. Right ventricular systolic function is normal. The right ventricular size is normal.  ??3. The mitral valve is normal in structure. No evidence of mitral valve regurgitation. No evidence of mitral stenosis.  ??4. The aortic valve is tricuspid. Aortic valve regurgitation is not visualized. No aortic stenosis is present.  ??5. The inferior vena cava is normal in size with greater than 50% respiratory variability, suggesting right atrial pressure of 3 mmHg.  ?  ?Neuro/Psych ?CVA   ? GI/Hepatic ?negative GI ROS, Neg liver ROS,   ?Endo/Other  ?negative endocrine ROSdiabetes ? Renal/GU ?negative Renal ROS  ? ?  ?Musculoskeletal ?negative musculoskeletal ROS ?(+)  ? Abdominal ?  ?Peds ? Hematology ?negative hematology ROS ?(+)   ?Anesthesia Other Findings ? ? Reproductive/Obstetrics ? ?  ? ? ? ? ? ? ? ? ? ? ? ? ? ?  ?  ? ? ? ? ? ? ? ?Anesthesia Physical ?Anesthesia Plan ? ?ASA: 4 and emergent ? ?Anesthesia Plan: General  ? ?Post-op Pain Management:   ? ?Induction: Intravenous ? ?PONV Risk Score and Plan: 2 and Ondansetron, Dexamethasone and Treatment may vary due to age or  medical condition ? ?Airway Management Planned: Oral ETT ? ?Additional Equipment: Arterial line ? ?Intra-op Plan:  ? ?Post-operative Plan: Post-operative intubation/ventilation ? ?Informed Consent:  ? ? ? ?Only emergency history available ? ?Plan Discussed with: Anesthesiologist, CRNA and Surgeon ? ?Anesthesia Plan Comments:   ? ? ? ? ? ?Anesthesia Quick Evaluation ? ?

## 2021-09-15 NOTE — Op Note (Signed)
Brief history: The patient is a 47 year old black male who presented with a large right MCA stroke.  He underwent a right MCA thrombectomy.  He became more somnolent today and was worked up with a head CT which demonstrated progression of a large right MCA distribution stroke with significant brain edema, compression, and midline shift.  I was consulted.  I discussed the situation with the patient's family.  We discussed the various treatment options including a decompressive right hemicraniectomy.  The patient's wife consented on his behalf after weighing the risk, benefits and alternatives. ? ?Preop diagnosis: Right MCA infarction, brain edema, cerebral compression ? ?Postop diagnosis: The same ? ?Procedure: Right frontotemporal parietal hemicraniectomy : Placement of bone flap in the abdominal subcutaneous tissue ? ?Surgeon: Dr. Delma Officer ? ?Assistant: Hildred Priest NP ? ?Anesthesia: General tracheal ? ?Estimated blood loss: 200 cc ? ?Specimens: None ? ?Drains: 1 epidural Jackson-Pratt drain ? ?Complications: None ? ?Description of procedure: The patient was brought to the operating room by the anesthesia team.  General endotracheal anesthesia was induced.  The patient remained in the supine position.  I applied the Mayfield three-point headrest to his calvarium.  A roll was placed under his right shoulder and his head was turned to the left exposing his right scalp.  His right scalp was then shaved with clippers and prepared with Betadine scrub and Betadine solution.  His abdomen was also prepared with Betadine scrub and Betadine solution. ? ?I then injected the area to be incised with Marcaine with epinephrine solution.  I used a scalpel to make a ?  Incision in the right frontal temporal parietal region.  We used Raney clips for wound edge hemostasis.  I then incised the temporalis fascia with the cautery.  We used electrocautery and periosteal elevators to explore I was his right frontal temporal parietal  skull.  We used towel clamps and rubber bands for retraction.  I then used the high-speed drill to create bur holes in the right temporal right parietal and the keyhole region.  I then used the footplate device to create a large right frontotemporoparietal craniotomy flap.  We elevated the craniotomy flap with the periosteal elevators.  The underlying dura was tense as expected.  I then used a scalpel to make a durotomy.  I continued the durotomy along the craniotomy edges.  The underlying brain was swollen with petechial hemorrhages.  We used the Metzenbaum scissors to fenestrate the dural flap.  We obtained hemostasis using bipolar cautery and Gelfoam.  I then placed a 10 mm flat Jackson-Pratt drain in the epidural space and tunneled it out through a separate stab wound.  We then removed the retractor and reapproximated the patient's galea with interrupted 2-0 Vicryl suture.  We reapproximated the skin with stainless steel staples. ? ?We then turned our attention to placement of the craniectomy flap in the abdominal soft tissues.  I injected the area to be incised.  I then made a large incision in the patient's right upper quadrant of his abdomen.  I used electrocautery to create a large subcutaneous pocket in the adipose tissue.  We then placed a craniotomy flap in this pocket and reapproximated the patient's anus tissue with interrupted 2-0 Vicryl suture.  We reapproximated the skin with stainless steel staples.  The wounds were then coated with bacitracin ointment.  A sterile dressing was applied at both wounds.  We then removed the drapes.  I then remove the Mayfield three-point headrest from his calvarium.  By  report all sponge, instrument, and needle counts were correct at the end of this case. ? ? ?

## 2021-09-15 NOTE — Assessment & Plan Note (Addendum)
Due to malignant MCA stroke.  Now status post decompressive craniectomy. Movement on right, but no consistently following commands.  ? ?-Stopped hypertonic saline.  ?-Optimize sedation to obtain best exam - Tube tolerance appears to be a major problem, will start fentanyl and try to wean off propofol or substitute Precedex for latter.  ?

## 2021-09-15 NOTE — Consult Note (Signed)
Reason for Consult: Right MCA infarction, brain compression ?Referring Physician: Dr. Leonie Man ? ?Jacob [name] is an 46 y.o. male.  ?HPI: The patient is a 46 year old right-handed black male who was admitted on 09/12/2021 with a right MCA thrombosis.  He underwent thrombectomy.  According to his wife, the patient became increasingly somnolent yesterday.  He was worked up with a head CT which demonstrated a right MCA infarction with significant midline shift.  A neurosurgical consultation was requested.  I have already posted the patient for surgery. ? ?Presently the patient is somnolent but arousable.  He is in no apparent distress.  His wife and friends are at the bedside.  He is on aspirin, Plavix and Lovenox.  Dr. Leonie Man has ordered platelets but are not available at the hospital presently.  He has been started on 3% saline. ?Past Medical History:  ?Diagnosis Date  ? Allergy   ? Diabetes mellitus without complication ([place])   ? Hypertension   ? ? ?Past Surgical History:  ?Procedure Laterality Date  ? APPENDECTOMY    ? IR CT HEAD LTD  09/12/2021  ? IR PERCUTANEOUS ART THROMBECTOMY/INFUSION INTRACRANIAL INC DIAG ANGIO  09/12/2021  ? IR US GUIDE VASC ACCESS RIGHT  09/12/2021  ? LAPAROSCOPIC APPENDECTOMY  10/24/2013  ? Procedure: APPENDECTOMY LAPAROSCOPIC;  Surgeon: Shann Medal, MD;  Location: [facility];  Service: General;;  ? RADIOLOGY WITH ANESTHESIA N/A 09/12/2021  ? Procedure: IR WITH ANESTHESIA;  Surgeon: Luanne Bras, MD;  Location: [facility];  Service: Radiology;  Laterality: N/A;  ? ? ?Family History  ?Problem Relation Age of Onset  ? Asthma Son   ? ? ?Social History:  reports that he has never smoked. He has never used smokeless tobacco. He reports current alcohol use. He reports that he does not use drugs. ? ?Allergies:  ?Allergies  ?Allergen Reactions  ? Hydrochlorothiazide Other (See Comments)  ?  Bilateral muscle cramping  ? ? ?Medications: I have reviewed the patient's current medications. ?Prior to Admission:   ?Facility-Administered Medications Prior to Admission  ?Medication Dose Route Frequency Provider Last Rate Last Admin  ? cloNIDine (CATAPRES) tablet 0.2 mg  0.2 mg Oral Once Mayers, Cari S, PA-C      ? ?Medications Prior to Admission  ?Medication Sig Dispense Refill Last Dose  ? clotrimazole (LOTRIMIN) 1 % cream Apply 1 application. topically daily as needed (foot itching).   unknown  ? ibuprofen (ADVIL) 200 MG tablet Take 400 mg by mouth every 6 (six) hours as needed for headache (pain).   09/09/2021  ? amLODipine (NORVASC) 10 MG tablet Take 1 tablet (10 mg total) by mouth daily. (Patient taking differently: Take 10 mg by mouth every evening.) 90 tablet 1   ? atorvastatin (LIPITOR) 20 MG tablet Take 1 tablet (20 mg total) by mouth daily. (Patient not taking: Reported on 09/12/2021) 90 tablet 1 Not Taking  ? Blood Glucose Monitoring Suppl (TRUE METRIX METER) w/Device KIT USE AS DIRECTED 1 kit 0   ? glipiZIDE (GLUCOTROL) 10 MG tablet Take 1 tablet (10 mg total) by mouth 2 (two) times daily before a meal. (Patient not taking: Reported on 09/12/2021) 180 tablet 1 Not Taking  ? glucose blood (TRUE METRIX BLOOD GLUCOSE TEST) test strip Use three times daily 100 each 12   ? lisinopril (ZESTRIL) 10 MG tablet Take 1 tablet (10 mg total) by mouth daily. (Patient not taking: Reported on 09/12/2021) 30 tablet 0 Not Taking  ? metFORMIN (GLUCOPHAGE) 500 MG tablet Take 2 tablets (  1,000 mg total) by mouth 2 (two) times daily with a meal. (Patient not taking: Reported on 09/12/2021) 120 tablet 6 Not Taking  ? TRUEplus Lancets 28G MISC use 3 (three) times daily before meals. 100 each 12   ? Vitamin D, Ergocalciferol, (DRISDOL) 1.25 MG (50000 UNIT) CAPS capsule Take 1 capsule (50,000 Units total) by mouth every 7 (seven) days. (Patient not taking: Reported on 09/12/2021) 4 capsule 2 Not Taking  ? ?Scheduled: ?  stroke: early stages of recovery book   Does not apply Once  ? sodium chloride   Intravenous Once  ? amLODipine  10 mg Oral Daily   ? aspirin EC  81 mg Oral Daily  ? atorvastatin  40 mg Oral Daily  ? Chlorhexidine Gluconate Cloth  6 each Topical Q0600  ? clopidogrel  75 mg Oral Daily  ? enoxaparin (LOVENOX) injection  40 mg Subcutaneous Q24H  ? insulin aspart  0-20 Units Subcutaneous Q4H  ? insulin detemir  15 Units Subcutaneous BID  ? lisinopril  10 mg Oral Daily  ? pantoprazole  40 mg Oral Daily  ? sodium chloride flush  3 mL Intravenous Once  ? topiramate  50 mg Oral BID  ? ?Continuous: ? clevidipine Stopped (09/13/21 1217)  ? sodium chloride (hypertonic) 75 mL/hr at 09/15/21 1331  ? ?[id number] **OR** acetaminophen (TYLENOL) oral liquid 160 mg/5 mL **OR** acetaminophen, acetaminophen-codeine, hydrALAZINE, labetalol, ondansetron (ZOFRAN) IV ?Anti-infectives (From admission, onward)  ? ? None  ? ?  ? ? ? ?Results for orders placed or performed during the hospital encounter of 09/12/21 (from the past 48 hour(s))  ?Glucose, capillary     Status: Abnormal  ? Collection Time: 09/13/21  3:49 PM  ?Result Value Ref Range  ? Glucose-Capillary 207 (H) 70 - 99 mg/dL  ?  Comment: Glucose reference range applies only to samples taken after fasting for at least 8 hours.  ?Glucose, capillary     Status: Abnormal  ? Collection Time: 09/13/21  8:23 PM  ?Result Value Ref Range  ? Glucose-Capillary 218 (H) 70 - 99 mg/dL  ?  Comment: Glucose reference range applies only to samples taken after fasting for at least 8 hours.  ?Glucose, capillary     Status: Abnormal  ? Collection Time: 09/14/21 12:06 AM  ?Result Value Ref Range  ? Glucose-Capillary 206 (H) 70 - 99 mg/dL  ?  Comment: Glucose reference range applies only to samples taken after fasting for at least 8 hours.  ?Glucose, capillary     Status: Abnormal  ? Collection Time: 09/14/21  3:23 AM  ?Result Value Ref Range  ? Glucose-Capillary 172 (H) 70 - 99 mg/dL  ?  Comment: Glucose reference range applies only to samples taken after fasting for at least 8 hours.  ?Glucose, capillary     Status:  Abnormal  ? Collection Time: 09/14/21  7:58 AM  ?Result Value Ref Range  ? Glucose-Capillary 148 (H) 70 - 99 mg/dL  ?  Comment: Glucose reference range applies only to samples taken after fasting for at least 8 hours.  ?Glucose, capillary     Status: Abnormal  ? Collection Time: 09/14/21 11:52 AM  ?Result Value Ref Range  ? Glucose-Capillary 158 (H) 70 - 99 mg/dL  ?  Comment: Glucose reference range applies only to samples taken after fasting for at least 8 hours.  ?Glucose, capillary     Status: Abnormal  ? Collection Time: 09/14/21  3:50 PM  ?Result Value Ref Range  ? Glucose-Capillary 163 (  H) 70 - 99 mg/dL  ?  Comment: Glucose reference range applies only to samples taken after fasting for at least 8 hours.  ?Glucose, capillary     Status: Abnormal  ? Collection Time: 09/14/21  8:20 PM  ?Result Value Ref Range  ? Glucose-Capillary 271 (H) 70 - 99 mg/dL  ?  Comment: Glucose reference range applies only to samples taken after fasting for at least 8 hours.  ? Comment 1 QC Due   ?Glucose, capillary     Status: Abnormal  ? Collection Time: 09/14/21 10:45 PM  ?Result Value Ref Range  ? Glucose-Capillary 238 (H) 70 - 99 mg/dL  ?  Comment: Glucose reference range applies only to samples taken after fasting for at least 8 hours.  ?Glucose, capillary     Status: Abnormal  ? Collection Time: 09/15/21  4:05 AM  ?Result Value Ref Range  ? Glucose-Capillary 181 (H) 70 - 99 mg/dL  ?  Comment: Glucose reference range applies only to samples taken after fasting for at least 8 hours.  ? Comment 1 Notify RN   ? Comment 2 Document in Chart   ?Triglycerides     Status: None  ? Collection Time: 09/15/21  5:59 AM  ?Result Value Ref Range  ? Triglycerides 106 <150 mg/dL  ?  Comment: Performed at [facility], [place] [address], [place], [place] [address]  ?CBC     Status: Abnormal  ? Collection Time: 09/15/21  5:59 AM  ?Result Value Ref Range  ? WBC 11.4 (H) 4.0 - 10.5 K/uL  ? RBC 5.16 4.22 - 5.81 MIL/uL  ? Hemoglobin 15.1 13.0 -  17.0 g/dL  ? HCT 43.7 39.0 - 52.0 %  ? MCV 84.7 80.0 - 100.0 fL  ? MCH 29.3 26.0 - 34.0 pg  ? MCHC 34.6 30.0 - 36.0 g/dL  ? RDW 13.1 11.5 - 15.5 %  ? Platelets 145 (L) 150 - 400 K/uL  ? nRBC 0.0 0.0 - 0.2 %  ?

## 2021-09-15 NOTE — Assessment & Plan Note (Addendum)
Status post tPA and thrombectomy.  Improvement in dense left hemiplegia ? ?-Resume secondary stroke prevention postop ?

## 2021-09-15 NOTE — Transfer of Care (Addendum)
Immediate Anesthesia Transfer of Care Note ? ?Patient: Jacob [name] ? ?Procedure(s) Performed: RIGHT DECOMPRESSIVE CRANIOTOMY, PLACEMENT OF BONE FLAP IN ABDOMEN (Right: Head) ? ?Patient Location: 4N ICU ? ?Anesthesia Type:General ? ?Level of Consciousness: Patient remains intubated per anesthesia plan ? ?Airway & Oxygen Therapy: Patient remains intubated per anesthesia plan and Patient placed on Ventilator (see vital sign flow sheet for setting) ? ?Post-op Assessment: Report given to RN and Post -op Vital signs reviewed and stable ? ?Post vital signs: Reviewed and stable ? ?Last Vitals:  ?Vitals Value Taken Time  ?BP    ?Temp    ?Pulse 117 09/15/21 1930  ?Resp 18 09/15/21 1930  ?SpO2 99 % 09/15/21 1930  ?Vitals shown include unvalidated device data. ? ?Last Pain:  ?Vitals:  ? 09/15/21 1613  ?TempSrc: Oral  ?PainSc:   ?   ? ?Patients Stated Pain Goal: 1 (09/14/21 1545) ? ?Complications: No notable events documented. ?

## 2021-09-15 NOTE — Consult Note (Signed)
? ?NAME:  Jacob [name], MRN:  [name], DOB:  17-Jan-1976, LOS: 3 ?ADMISSION DATE:  09/12/2021, CONSULTATION DATE:  09/15/2021 ?REFERRING MD:  Leonie Man - Stroke, CHIEF COMPLAINT:  Cerebral edema.   ? ?History of Present Illness:  ?46 year old man with worsening cerebral edema. ? ?He presented 3 days ago with left-sided weakness which was initially waxing and waning. CTA showed right M1 occlusion.  Further decline post TNK brought for thrombectomy at [facility] 3 revascularization of right M2. ?Transfer to floor but experienced neurological decline 4/24.  CT showed malignant cerebral edema with 12 mm right to left midline shift and Trapping of the temporal horn. ? ?He was brought back to the ICU and started on 3% hypertonic saline and prepped for the operating room.  Platelets were transfused to offset effect of clopidogrel and aspirin. ? ?Pertinent  Medical History  ? ?Past Medical History:  ?Diagnosis Date  ? Allergy   ? Diabetes mellitus without complication ([place])   ? Hypertension   ? ? ? ?Significant Hospital Events: ?Including procedures, antibiotic start and stop dates in addition to other pertinent events   ?4/24 decompressive craniectomy. ? ?Interim History / Subjective:  ?Patient remains drowsy but will follow commands.  He is nonverbal . ? ?Objective   ?Blood pressure (!) 145/66, pulse 82, temperature 98.8 ?F (37.1 ?C), temperature source Axillary, resp. rate (!) 27, height 6\' 1"  (1.854 m), weight 124 kg, SpO2 93 %. ?   ?   ? ?Intake/Output Summary (Last 24 hours) at 09/15/2021 1446 ?Last data filed at 09/15/2021 1400 ?Gross per 24 hour  ?Intake 1095.15 ml  ?Output 1600 ml  ?Net -504.85 ml  ? ?Filed Weights  ? 09/12/21 0303 09/12/21 0323 09/12/21 [id number]  ?Weight: 107 kg 107 kg 124 kg  ? ? ?Examination: ?General: Well-built man appearing his stated age. ?HENT: No scleral icterus. ?Lungs: Clear to auscultation bilaterally.  Positive oral secretions but no rhonchi. ?Cardiovascular: Heart sounds unremarkable.  Extremities  warm and well-perfused ?Abdomen: Soft and nontender ?Extremities: No edema ?Neuro: Obtunded.  Will follow commands on right side with prompting.  Nonverbal. ?GU: Deferred ? ?Ancillary tests personally reviewed  ?CT scan from 4/24 shows large area of hypodensity in the right MCA territory with 1 cm midline shift. ?Sodium 142. ? ?Assessment & Plan:  ?Cerebral edema ([place]) ?Due to malignant MCA stroke. ?-Continue hypertonic saline ?-For decompressive craniectomy. ? ?Arterial ischemic stroke, MCA (middle cerebral artery), right, acute ([place]) ?Status post tPA and thrombectomy.  Improvement in dense left hemiplegia ? ?-For decompressive craniectomy. ?-Resume secondary stroke prevention postop ? ?Diabetes mellitus without complication ([place]) ?Reasonable blood sugar control on sliding scale insulin. ? ?-Maintain euglycemia to limit cerebral edema. ? ? ?Best Practice (right click and "Reselect all SmartList Selections" daily)  ? ?Diet/type: NPO ?DVT prophylaxis: SCD ?GI prophylaxis: PPI ?Lines: N/A ?Foley:  Yes, and it is still needed ?Code Status:  full code ?Last date of multidisciplinary goals of care discussion [updated 4/24 by Dr. Leonie Man ? ?CRITICAL CARE ?Performed by: Kipp Brood ? ? ?Total critical care time: 35 minutes ? ?Critical care time was exclusive of separately billable procedures and treating other patients. ? ?Critical care was necessary to treat or prevent imminent or life-threatening deterioration. ? ?Critical care was time spent personally by me on the following activities: development of treatment plan with patient and/or surrogate as well as nursing, discussions with consultants, evaluation of patient's response to treatment, examination of patient, obtaining history from patient or surrogate, ordering and performing  treatments and interventions, ordering and review of laboratory studies, ordering and review of radiographic studies, pulse oximetry, re-evaluation of patient's condition and participation  in multidisciplinary rounds. ? ?Kipp Brood, MD FRCPC ?ICU Physician ?[place]  ?Pager: [phone] ?Mobile: [phone] ?After hours: [phone]. ? ? ? ? ? ? ?

## 2021-09-16 ENCOUNTER — Encounter (HOSPITAL_COMMUNITY): Payer: Self-pay | Admitting: Neurosurgery

## 2021-09-16 ENCOUNTER — Inpatient Hospital Stay (HOSPITAL_COMMUNITY): Payer: BC Managed Care – PPO

## 2021-09-16 ENCOUNTER — Other Ambulatory Visit: Payer: Self-pay

## 2021-09-16 ENCOUNTER — Inpatient Hospital Stay: Payer: Self-pay

## 2021-09-16 DIAGNOSIS — J9601 Acute respiratory failure with hypoxia: Secondary | ICD-10-CM | POA: Diagnosis not present

## 2021-09-16 DIAGNOSIS — R4182 Altered mental status, unspecified: Secondary | ICD-10-CM | POA: Diagnosis not present

## 2021-09-16 DIAGNOSIS — I639 Cerebral infarction, unspecified: Secondary | ICD-10-CM | POA: Diagnosis not present

## 2021-09-16 LAB — CBC
HCT: 38.2 % — ABNORMAL LOW (ref 39.0–52.0)
Hemoglobin: 13.5 g/dL (ref 13.0–17.0)
MCH: 30.3 pg (ref 26.0–34.0)
MCHC: 35.3 g/dL (ref 30.0–36.0)
MCV: 85.7 fL (ref 80.0–100.0)
Platelets: 247 10*3/uL (ref 150–400)
RBC: 4.46 MIL/uL (ref 4.22–5.81)
RDW: 13.6 % (ref 11.5–15.5)
WBC: 17.9 10*3/uL — ABNORMAL HIGH (ref 4.0–10.5)
nRBC: 0 % (ref 0.0–0.2)

## 2021-09-16 LAB — BASIC METABOLIC PANEL
Anion gap: 7 (ref 5–15)
BUN: 22 mg/dL — ABNORMAL HIGH (ref 6–20)
CO2: 20 mmol/L — ABNORMAL LOW (ref 22–32)
Calcium: 8.5 mg/dL — ABNORMAL LOW (ref 8.9–10.3)
Chloride: 118 mmol/L — ABNORMAL HIGH (ref 98–111)
Creatinine, Ser: 1.1 mg/dL (ref 0.61–1.24)
GFR, Estimated: >60 mL/min (ref >60)
Glucose, Bld: 282 mg/dL — ABNORMAL HIGH (ref 70–99)
Potassium: 3.5 mmol/L (ref 3.5–5.1)
Sodium: 145 mmol/L (ref 135–145)

## 2021-09-16 LAB — PHOSPHORUS: Phosphorus: 1.3 mg/dL — ABNORMAL LOW (ref 2.5–4.6)

## 2021-09-16 LAB — GLUCOSE, CAPILLARY
Glucose-Capillary: 301 mg/dL — ABNORMAL HIGH (ref 70–99)
Glucose-Capillary: 284 mg/dL — ABNORMAL HIGH (ref 70–99)
Glucose-Capillary: 238 mg/dL — ABNORMAL HIGH (ref 70–99)
Glucose-Capillary: 214 mg/dL — ABNORMAL HIGH (ref 70–99)
Glucose-Capillary: 257 mg/dL — ABNORMAL HIGH (ref 70–99)
Glucose-Capillary: 307 mg/dL — ABNORMAL HIGH (ref 70–99)

## 2021-09-16 LAB — BPAM PLATELET PHERESIS
Blood Product Expiration Date: 202304252359
Blood Product Expiration Date: 202304262359
ISSUE DATE / TIME: 202304241415
ISSUE DATE / TIME: 202304241552
Unit Type and Rh: 5100
Unit Type and Rh: 6200

## 2021-09-16 LAB — MAGNESIUM: Magnesium: 2.3 mg/dL (ref 1.7–2.4)

## 2021-09-16 LAB — PREPARE PLATELET PHERESIS
Unit division: 0
Unit division: 0

## 2021-09-16 LAB — SODIUM
Sodium: 144 mmol/L (ref 135–145)
Sodium: 147 mmol/L — ABNORMAL HIGH (ref 135–145)
Sodium: 151 mmol/L — ABNORMAL HIGH (ref 135–145)

## 2021-09-16 IMAGING — CT CT HEAD W/O CM
3 series · 14 of 47 positions shown, 16 images · non-contrast
Comparison: None.

CLINICAL DATA: Stroke, follow up



[Series 3: head 5.0 h30s · axial · 0.49mm/px · z∈[-220,-70]mm · 8 of 36 slices shown, 10 images]
[im 3/36  brain]
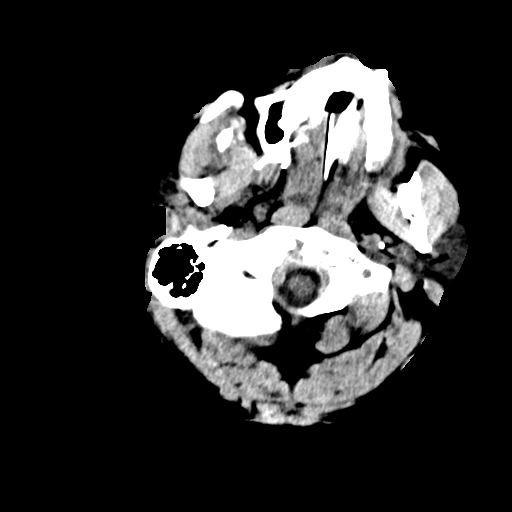
[im 3/36  bone]
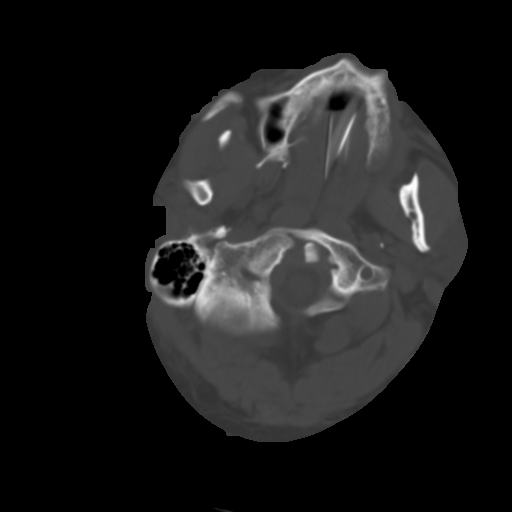
[im 8/36  brain]
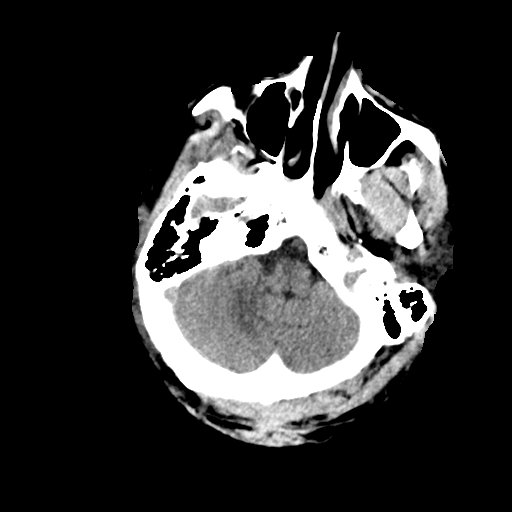
[im 11/36  brain]
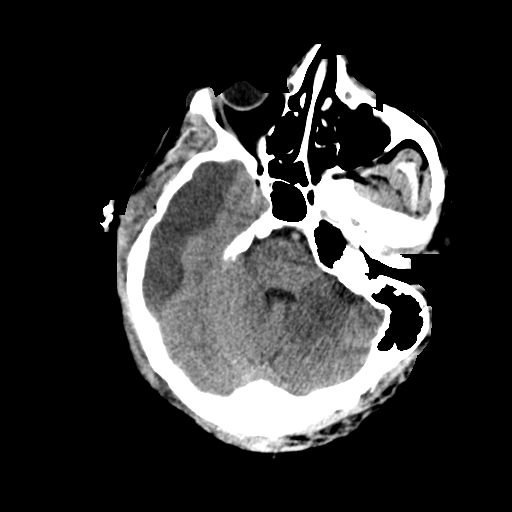
[im 16/36  brain]
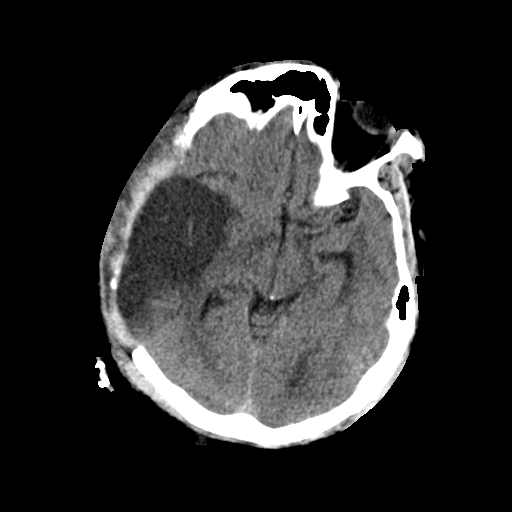
[im 20/36  brain]
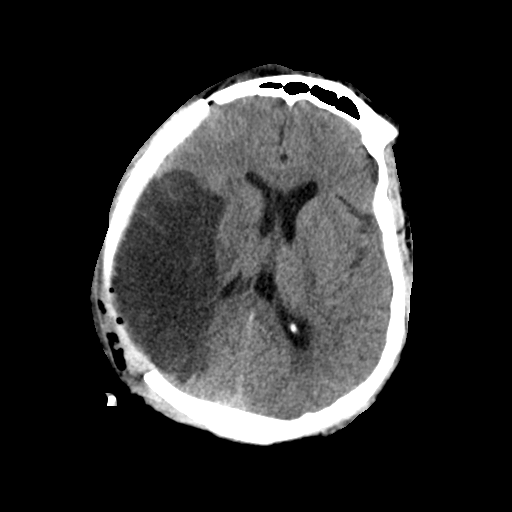
[im 20/36  bone]
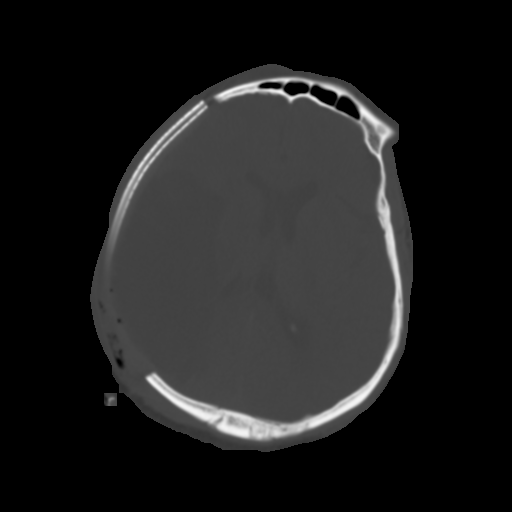
[im 25/36  brain]
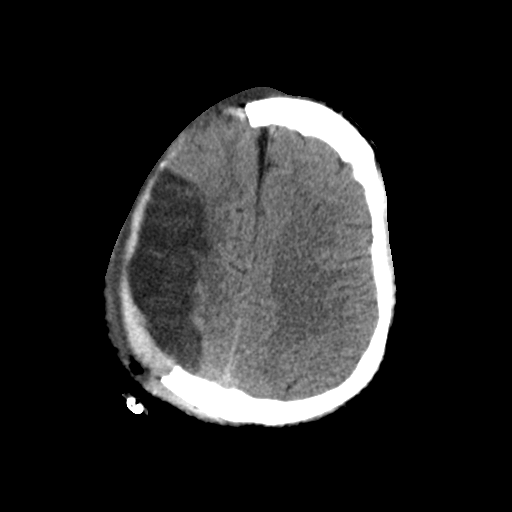
[im 28/36  brain]
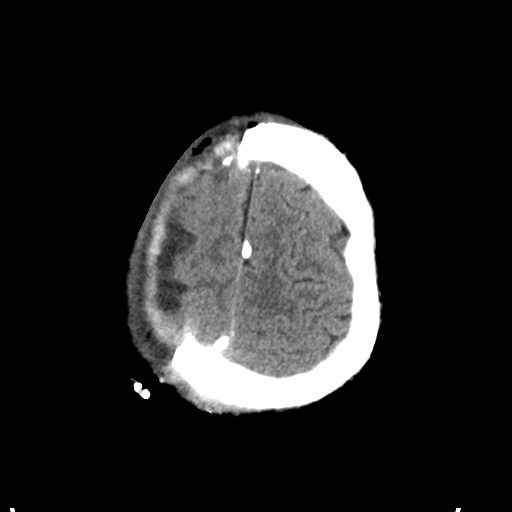
[im 33/36  brain]
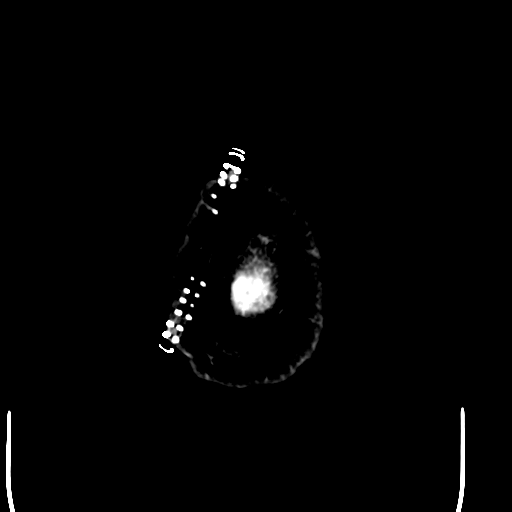

[Series 5: head 3.0 mpr cor · coronal · 0.35mm/px · 3 of 83 slices shown]
[im 28/83  brain]
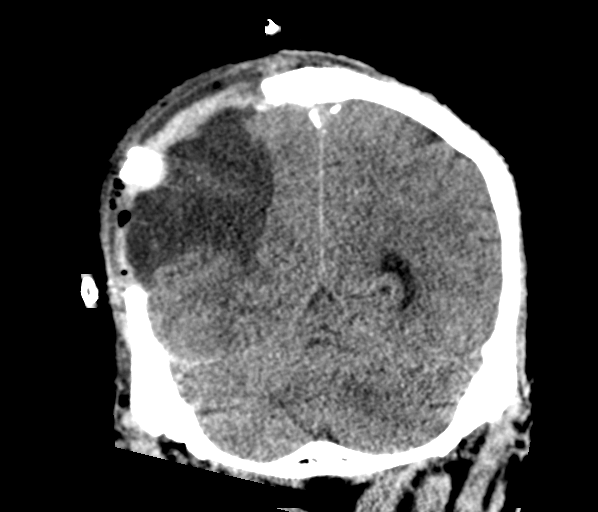
[im 37/83  brain]
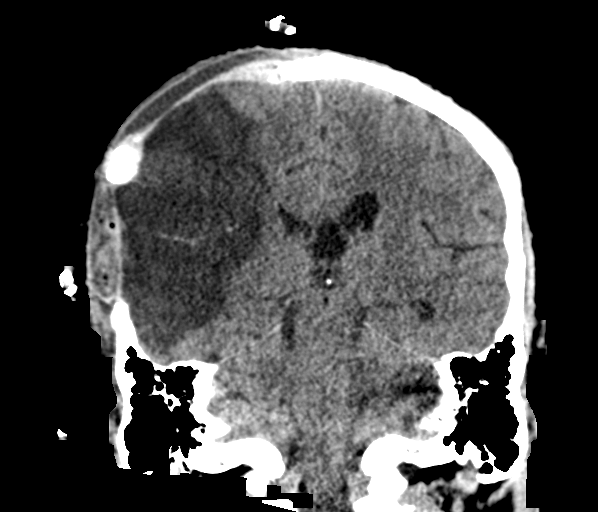
[im 46/83  brain]
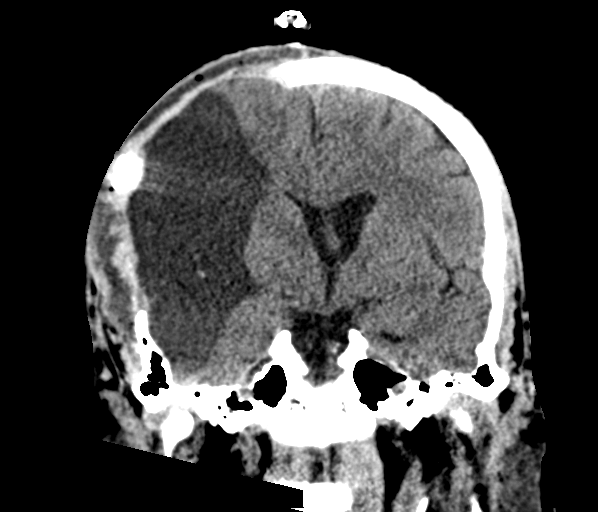

[Series 6: head 3.0 mpr sag · sagittal · 0.40mm/px · 3 of 66 slices shown]
[im 27/66  brain]
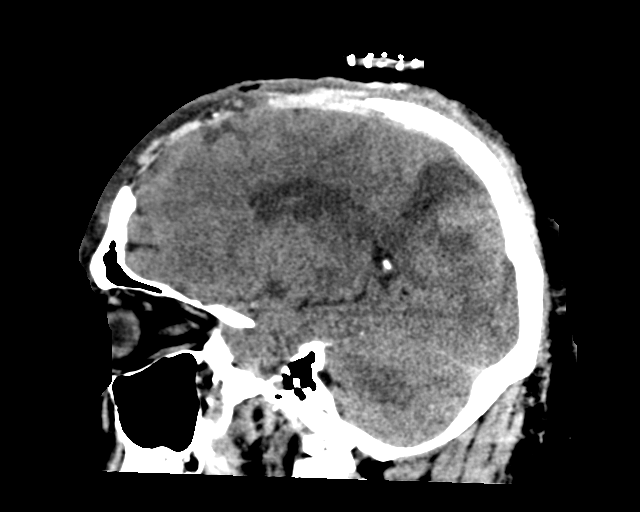
[im 33/66  brain]
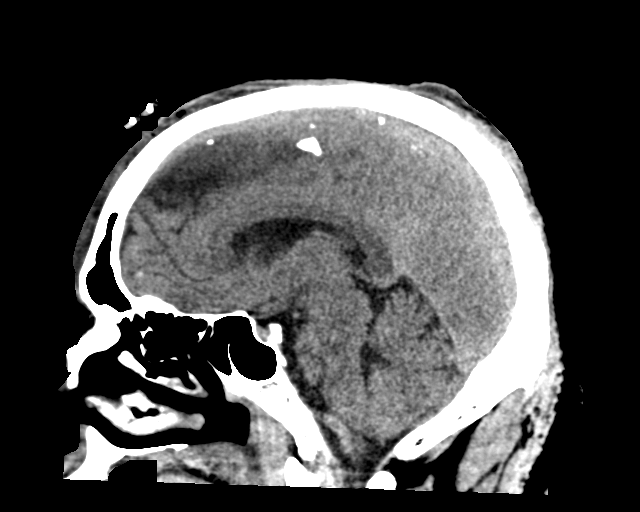
[im 39/66  brain]
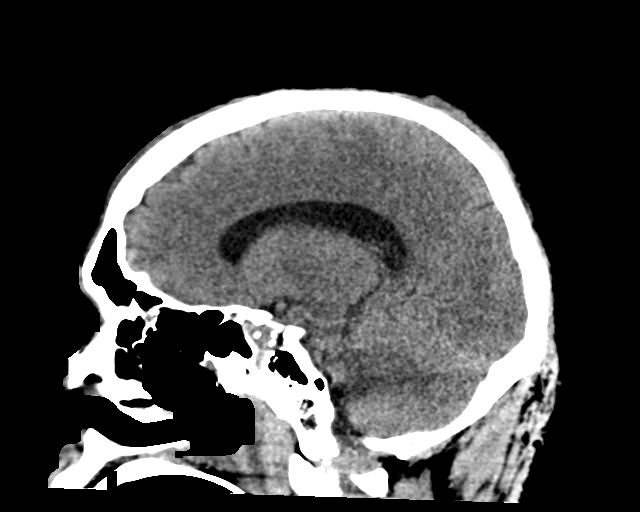

[14 of 47 positions shown; findings below may reference images not displayed]

FINDINGS: Brain: Status post right frontal craniotomy. No substantial change
in large right MCA territory infarct with extensive associated edema
and areas of petechial hemorrhage. No mass occupying acute
hemorrhage. Improved mass effect and 5 mm of leftward midline shift
(previously 12 mm) at the foramen of CONANT. Subjacent to the
craniotomy there is suspected extra-axial hemorrhage posteriorly,
measuring up to 11 mm in thickness posteriorly. A surgical drain is
in place.

New area of hypoattenuation in the left middle cerebellar peduncle
and cerebellum (for example, see series 3, images [DATE]. Streak
artifact limits assessment in this region.

Improved rounding of the left temporal horn, compatible with
improved ventricular entrapment.

Vascular: Hyperdense right M2 MCA vessels, compatible with known
occlusion.

Skull: Right craniotomy

Sinuses/Orbits: Mild paranasal sinus mucosal thickening no acute
orbital findings.

Other: No mastoid effusions.
IMPRESSION: 1. New area of hypoattenuation in the left middle cerebellar
peduncle and cerebellum. This finding raises concern for
new/interval acute infarct, although streak artifact limits
assessment in this region. An MRI could further evaluate if
clinically warranted.
2. No substantial change in appearance of an evolving large right
MCA territory infarct with petechial hemorrhage. Interval right
craniotomy with improved mass effect and 5 mm of leftward midline
shift (previously 12 mm) at the foramen of CONANT. Subjacent to the
craniotomy there is suspected extra-axial hemorrhage posteriorly,
measuring up to 11 mm in thickness posteriorly. A surgical drain is
in place.
3. Improved rounding of the left temporal horn, compatible with
improved ventricular entrapment.
4. Hyperdense right M2 MCA vessels, compatible with known occlusion

## 2021-09-16 MED ORDER — FENTANYL CITRATE PF 50 MCG/ML IJ SOSY
50.0000 ug | PREFILLED_SYRINGE | INTRAMUSCULAR | Status: DC | PRN
  Administered 2021-09-16: 50 ug via INTRAVENOUS
  Filled 2021-09-16: qty 1

## 2021-09-16 MED ORDER — SODIUM CHLORIDE 0.9% FLUSH: 10.0000 mL | INTRAVENOUS | Status: DC | PRN

## 2021-09-16 MED ORDER — SODIUM CHLORIDE 0.9% FLUSH
10.0000 mL | Freq: Two times a day (BID) | INTRAVENOUS | Status: DC
  Administered 2021-09-16: 40 mL
  Administered 2021-09-16: 10 mL
  Administered 2021-09-17: 30 mL
  Administered 2021-09-17 – 2021-09-18 (×3): 10 mL
  Administered 2021-09-19: 30 mL
  Administered 2021-09-20: 20 mL
  Administered 2021-09-20: 10 mL
  Administered 2021-09-21: 30 mL
  Administered 2021-09-22 – 2021-09-24 (×4): 10 mL

## 2021-09-16 MED ORDER — AMLODIPINE BESYLATE 10 MG PO TABS
10.0000 mg | ORAL_TABLET | Freq: Every day | ORAL | Status: DC
Start: 2021-09-16 — End: 2021-10-08
  Administered 2021-09-16 – 2021-10-08 (×23): 10 mg
  Filled 2021-09-16 (×23): qty 1

## 2021-09-16 MED ORDER — LABETALOL HCL 5 MG/ML IV SOLN: 40.0000 mg | INTRAVENOUS | Status: DC | PRN

## 2021-09-16 MED ORDER — PROSOURCE TF PO LIQD
45.0000 mL | Freq: Two times a day (BID) | ORAL | Status: DC
  Administered 2021-09-16 – 2021-09-17 (×3): 45 mL
  Filled 2021-09-16 (×2): qty 45

## 2021-09-16 MED ORDER — INSULIN DETEMIR 100 UNIT/ML ~~LOC~~ SOLN
5.0000 [IU] | Freq: Once | SUBCUTANEOUS | Status: AC
  Administered 2021-09-16: 5 [IU] via SUBCUTANEOUS
  Filled 2021-09-16: qty 0.05

## 2021-09-16 MED ORDER — INSULIN ASPART 100 UNIT/ML IJ SOLN
6.0000 [IU] | INTRAMUSCULAR | Status: DC
  Administered 2021-09-16 – 2021-09-18 (×12): 6 [IU] via SUBCUTANEOUS

## 2021-09-16 MED ORDER — HYDRALAZINE HCL 20 MG/ML IJ SOLN
25.0000 mg | Freq: Four times a day (QID) | INTRAMUSCULAR | Status: DC | PRN
  Administered 2021-09-17 – 2021-09-21 (×7): 25 mg via INTRAVENOUS
  Filled 2021-09-16 (×7): qty 2

## 2021-09-16 MED ORDER — POTASSIUM PHOSPHATES 15 MMOLE/5ML IV SOLN
45.0000 mmol | Freq: Once | INTRAVENOUS | Status: AC
  Administered 2021-09-16: 45 mmol via INTRAVENOUS
  Filled 2021-09-16: qty 15

## 2021-09-16 MED ORDER — INSULIN DETEMIR 100 UNIT/ML ~~LOC~~ SOLN
20.0000 [IU] | Freq: Two times a day (BID) | SUBCUTANEOUS | Status: DC
  Administered 2021-09-16 – 2021-09-17 (×2): 20 [IU] via SUBCUTANEOUS
  Filled 2021-09-16 (×3): qty 0.2

## 2021-09-16 MED ORDER — VITAL HIGH PROTEIN PO LIQD
1000.0000 mL | ORAL | Status: DC
  Administered 2021-09-16: 40 mL

## 2021-09-16 MED ORDER — FENTANYL CITRATE PF 50 MCG/ML IJ SOSY: 50.0000 ug | PREFILLED_SYRINGE | INTRAMUSCULAR | Status: DC | PRN

## 2021-09-16 MED FILL — Thrombin For Soln 5000 Unit: CUTANEOUS | Qty: 5000 | Status: AC

## 2021-09-16 MED FILL — Thrombin For Soln 5000 Unit: CUTANEOUS | Qty: 2 | Status: AC

## 2021-09-16 NOTE — Progress Notes (Signed)
Pt transported to CT via ventilator with no complications noted. Pt returned to previous Fi02.  ?

## 2021-09-16 NOTE — Progress Notes (Signed)
? ? ?Referring Physician(s): ?Stroke Team  ? ?Supervising Physician: Pedro Earls ? ?Patient Status:  [facility] - In-pt ? ?Chief Complaint: ? ?Code stroke s/p treatment of M2/MCA  posterior division branch by Dr. Raliegh Ip. Karenann Cai on 09/12/21.  ?- recovery complicated by increased cerebral edema, s/p right decompressive craniectomy by neurosurgery on 4/24.  ? ?Subjective: ? ?Pt laying in bed, sedated and intubated.  ?Wife at bedside, she is just wondering what would be the next step for patient's hospital course.  ?She states that there was a discussion about weaning sedation and attempting extubation today.  ?She reports patient reacted and move his arms and legs little bit when his breathing tube was cleaned.  ?No purposeful movement noted.  ? ?Allergies: ?Hydrochlorothiazide ? ?Medications: ?Prior to Admission medications   ?Medication Sig Start Date End Date Taking? Authorizing Provider  ?clotrimazole (LOTRIMIN) 1 % cream Apply 1 application. topically daily as needed (foot itching).   Yes [provider]  ?ibuprofen (ADVIL) 200 MG tablet Take 400 mg by mouth every 6 (six) hours as needed for headache (pain).   Yes [provider]  ?amLODipine (NORVASC) 10 MG tablet Take 1 tablet (10 mg total) by mouth daily. ?Patient taking differently: Take 10 mg by mouth every evening. 04/08/21   Charlott Rakes, MD  ?atorvastatin (LIPITOR) 20 MG tablet Take 1 tablet (20 mg total) by mouth daily. ?Patient not taking: Reported on 09/12/2021 04/08/21   Charlott Rakes, MD  ?Blood Glucose Monitoring Suppl (TRUE METRIX METER) w/Device KIT USE AS DIRECTED 12/04/20   Charlott Rakes, MD  ?glipiZIDE (GLUCOTROL) 10 MG tablet Take 1 tablet (10 mg total) by mouth 2 (two) times daily before a meal. ?Patient not taking: Reported on 09/12/2021 04/08/21   Charlott Rakes, MD  ?glucose blood (TRUE METRIX BLOOD GLUCOSE TEST) test strip Use three times daily 10/31/20   Mayers, Cari S, PA-C  ?lisinopril (ZESTRIL)  10 MG tablet Take 1 tablet (10 mg total) by mouth daily. ?Patient not taking: Reported on 09/12/2021 09/10/21   Charlott Rakes, MD  ?metFORMIN (GLUCOPHAGE) 500 MG tablet Take 2 tablets (1,000 mg total) by mouth 2 (two) times daily with a meal. ?Patient not taking: Reported on 09/12/2021 01/06/21   Charlott Rakes, MD  ?TRUEplus Lancets 28G MISC use 3 (three) times daily before meals. 10/31/20   Mayers, Loraine Grip, PA-C  ?Vitamin D, Ergocalciferol, (DRISDOL) 1.25 MG (50000 UNIT) CAPS capsule Take 1 capsule (50,000 Units total) by mouth every 7 (seven) days. ?Patient not taking: Reported on 09/12/2021 11/04/20   Mayers, Loraine Grip, PA-C  ? ? ? ?Vital Signs: ?BP 120/68   Pulse (!) 126   Temp (!) 101.4 ?F (38.6 ?C) (Axillary)   Resp (!) 24   Ht 6' 1"  (1.854 m)   Wt 273 lb 5.9 oz (124 kg)   SpO2 100%   BMI 36.07 kg/m?  ? ?Physical Exam ?Constitutional:   ?   General: He is not in acute distress. ?   Appearance: He is ill-appearing.  ?HENT:  ?   Head:  ?   Comments: Dressing on right frontal/parietal bones ?Cardiovascular:  ?   Rate and Rhythm: Regular rhythm.  ?   Comments: HR - 56-62 ?Pulmonary:  ?   Comments: Intubated  ?Neurological:  ?   Comments: Sedated  ? ? ?Imaging: ?CT HEAD WO CONTRAST (5MM) ? ?Result Date: 09/15/2021 ?CLINICAL DATA:  Stroke follow-up, altered mental status EXAM: CT HEAD WITHOUT CONTRAST TECHNIQUE: Contiguous axial images were obtained  from the base of the skull through the vertex without intravenous contrast. RADIATION DOSE REDUCTION: This exam was performed according to the departmental dose-optimization program which includes automated exposure control, adjustment of the mA and/or kV according to patient size and/or use of iterative reconstruction technique. COMPARISON:  CT head 09/12/2021, MR head 09/13/2021 FINDINGS: Brain: There is ongoing evolution of the large right MCA distribution infarct which involves right external capsule/insula, anterior temporal lobe, and much of the frontal and parietal  lobe cortex. There is increased edema resulting in near-complete effacement of the right lateral ventricle and 12 mm leftward midline shift, increased from 2 mm on the MRI from 2 days prior. There is mild enlargement of the right temporal horn raising suspicion for a trapped ventricle. There is no evidence of hemorrhagic transformation. There is no evidence of acute intracranial hemorrhage or extra-axial fluid collection. Vascular: No definite dense vessel sign is seen. Skull: Normal. Negative for fracture or focal lesion. Sinuses/Orbits: The paranasal sinuses are clear. The globes and orbits are unremarkable. Other: None. IMPRESSION: Ongoing evolution of the large right MCA distribution infarct with increased edema resulting in near-complete effacement of the right lateral ventricle and 12 mm leftward midline shift, increased from 2 mm on the brain MRI from 2 days prior. Enlargement of the right temporal horn raises suspicion for a trapped ventricle. No evidence of hemorrhagic transformation. These results were called by telephone at the time of interpretation on 09/15/2021 at 10:23 am to provider Dr Leonie Man, who verbally acknowledged these results. Electronically Signed   By: Valetta Mole M.D.   On: 09/15/2021 10:34  ? ?MR ANGIO HEAD WO CONTRAST ? ?Result Date: 09/13/2021 ?CLINICAL DATA:  Stroke follow-up.  24 hours post thrombolytics EXAM: MRI HEAD WITHOUT CONTRAST MRA HEAD WITHOUT CONTRAST TECHNIQUE: Multiplanar, multi-echo pulse sequences of the brain and surrounding structures were acquired without intravenous contrast. Angiographic images of the Circle of Willis were acquired using MRA technique without intravenous contrast. COMPARISON:  CT, CTA, and MRA the preceding day. FINDINGS: MRI HEAD FINDINGS Brain: Progressive, confluent right MCA territory infarct diffusely involving the cortex and sparing the basal ganglia. Swelling causes 2 mm of midline shift measured at the septum pellucidum. No hemorrhage. Tubular  gradient hypointensity along the affected right cerebral hemisphere is intravascular. No pre-existing infarct, hydrocephalus, or mass Vascular: See below Skull and upper cervical spine: Normal marrow signal Sinuses/Orbits: Negative Other: Intermittent motion artifact. MRA HEAD FINDINGS Anterior circulation: Atheromatous type irregularity of the carotid siphons. More proximal ICA irregularity attributed to skull base artifact. Reocclusion of the more inferior right M2 branch. Advanced stenosis of the distal left M1 segment. Atheromatous irregularity of medium size vessels, fairly extensive and greater than expected for age Posterior circulation: Patent vertebral and basilar arteries with mild-to-moderate right vertebrobasilar junction stenosis. Atheromatous irregularity of bilateral PCA, greater on the left, without proximal occlusion. Negative for aneurysm IMPRESSION: Brain MRI: Progression to large right MCA territory infarct. Swelling causes 2 mm of midline shift. Intracranial MRA: 1. Reoccluded right M2 branch. 2. Intracranial atherosclerosis including advanced left M1 stenosis. Electronically Signed   By: Jorje Guild M.D.   On: 09/13/2021 04:25  ? ?MR BRAIN WO CONTRAST ? ?Result Date: 09/13/2021 ?CLINICAL DATA:  Stroke follow-up.  24 hours post thrombolytics EXAM: MRI HEAD WITHOUT CONTRAST MRA HEAD WITHOUT CONTRAST TECHNIQUE: Multiplanar, multi-echo pulse sequences of the brain and surrounding structures were acquired without intravenous contrast. Angiographic images of the Circle of Willis were acquired using MRA technique without intravenous contrast. COMPARISON:  CT, CTA, and MRA the preceding day. FINDINGS: MRI HEAD FINDINGS Brain: Progressive, confluent right MCA territory infarct diffusely involving the cortex and sparing the basal ganglia. Swelling causes 2 mm of midline shift measured at the septum pellucidum. No hemorrhage. Tubular gradient hypointensity along the affected right cerebral hemisphere  is intravascular. No pre-existing infarct, hydrocephalus, or mass Vascular: See below Skull and upper cervical spine: Normal marrow signal Sinuses/Orbits: Negative Other: Intermittent motion artifact. MRA HEAD

## 2021-09-16 NOTE — Progress Notes (Addendum)
STROKE TEAM PROGRESS NOTE  ? ?INTERVAL HISTORY ?Patient is seen in his room with his wife at the bedside.  He was transferred back to the ICU yesterday and underwent decompressive hemicraniectomy with neurosurgery.  He remains on 3% HTS and is intubated.  Serum sodium is 147.  Plan is to try to wean ventilator and possibly extubate today or tomorrow.  His wife is at the bedside. ? ?Vitals:  ? 09/16/21 0600 09/16/21 0700 09/16/21 0800 09/16/21 0837  ?BP: 126/71 120/68    ?Pulse: (!) 111 (!) 102  (!) 126  ?Resp: 20 18  (!) 24  ?Temp:   (!) 101.4 ?F (38.6 ?C)   ?TempSrc:   Axillary   ?SpO2: 98% 99%  100%  ?Weight:      ?Height:      ? ?CBC:  ?Recent Labs  ?Lab 09/12/21 ?0300 09/12/21 ?0305 09/15/21 ?0559 09/15/21 ?1750 09/15/21 ?2023 09/16/21 ?0421  ?WBC 5.5  --  11.4*  --   --  17.9*  ?NEUTROABS 2.1  --   --   --   --   --   ?HGB 14.3   < > 15.1   < > 12.9* 13.5  ?HCT 40.0   < > 43.7   < > 38.0* 38.2*  ?MCV 83.0  --  84.7  --   --  85.7  ?PLT 174  --  145*  --   --  247  ? < > = values in this interval not displayed.  ? ? ?Basic Metabolic Panel:  ?Recent Labs  ?Lab 09/15/21 ?0559 09/15/21 ?1324 09/15/21 ?2023 09/16/21 ?[id number] 09/16/21 ?0421 09/16/21 ?0831  ?NA 142   < > 146*   < > 145 147*  ?K 3.3*   < > 3.7  --  3.5  --   ?CL 109  --   --   --  118*  --   ?CO2 25  --   --   --  20*  --   ?GLUCOSE 196*  --   --   --  282*  --   ?BUN 16  --   --   --  22*  --   ?CREATININE 1.14  --   --   --  1.10  --   ?CALCIUM 9.1  --   --   --  8.5*  --   ? < > = values in this interval not displayed.  ? ? ?Lipid Panel:  ?Recent Labs  ?Lab 09/12/21 ?0356 09/15/21 ?0559  ?CHOL 191  --   ?TRIG 284* 106  ?HDL 38*  --   ?CHOLHDL 5.0  --   ?VLDL 57*  --   ?[place]ley 96  --   ? ? ?HgbA1c:  ?Recent Labs  ?Lab 09/12/21 ?0300  ?HGBA1C 8.1*  ? ? ?Urine Drug Screen:  ?Recent Labs  ?Lab 09/12/21 ?1437  ?LABOPIA NONE DETECTED  ?COCAINSCRNUR NONE DETECTED  ?LABBENZ NONE DETECTED  ?AMPHETMU NONE DETECTED  ?THCU NONE DETECTED  ?LABBARB NONE DETECTED   ? ?  ?Alcohol Level No results for input(s): ETH in the last 168 hours. ? ?IMAGING past 24 hours ?DG Chest Port 1 View ? ?Result Date: 09/15/2021 ?CLINICAL DATA:  Endotracheal and orogastric tube. EXAM: PORTABLE CHEST 1 VIEW COMPARISON:  Chest x-ray 10/23/2013. FINDINGS: The endotracheal tube tip is 4.7 cm above the carina. Enteric tube tip is in the mid stomach. Cardiomediastinal silhouette is within normal limits. There is improved aeration in the left lung base. Small left pleural  effusion persists. Right lung is clear. No pneumothorax or acute fracture. IMPRESSION: 1. Improved aeration in the left lung base with persistent small left pleural effusion. 2. The endotracheal tube tip is 4.7 cm above carina. 3. Enteric tube tip is in the mid stomach. Electronically Signed   By: Ronney Asters M.D.   On: 09/15/2021 23:07  ? ?DG CHEST PORT 1 VIEW ? ?Result Date: 09/15/2021 ?CLINICAL DATA:  Endotracheal tube placement. EXAM: PORTABLE CHEST 1 VIEW COMPARISON:  10/23/2013. FINDINGS: The heart size and mediastinal contours are within normal limits. There is hazy opacification of the left lung base. No pneumothorax is seen. No acute osseous abnormality. The enteric tube terminates at the carina and should be retracted approximately 3.5 cm. An enteric tube courses over the left upper quadrant and likely terminates in the stomach. IMPRESSION: 1. Hazy opacification at the left lung base, question small pleural effusion. 2. The endotracheal tube is at the carina and should be retracted approximately 3.5 cm. Electronically Signed   By: Brett Fairy M.D.   On: 09/15/2021 21:03  ? ?[place] EKG SITE RITE ? ?Result Date: 09/16/2021 ?If [employer] not attached, placement could not be confirmed due to current cardiac rhythm.  ? ?PHYSICAL EXAM ? ?Physical Exam  ?Constitutional: Appears well-developed and well-nourished middle-age is sedated and intubated African-American male, intubated ?Cardiovascular: Normal rate and regular rhythm.   ?Respiratory: Respirations synchronous with ventilator ? ?Neuro (intubated and on sedation with propofol): Unresponsive eyes closed.  Does not follow commands.  PERRL, +corneal reflexes, no oculocephalic reflex, cough and gag reflexes present, will not follow commands or move extremities to noxious stimuli ? ? ?ASSESSMENT/PLAN ?Jacob [name] is a 46 y.o. male with history of HTN, DM2 presenting with left side facial droop, left arm weakness. Received TNKase. Neuro status declined during MRI (NIHSS 13),taken for thrombectomy with Dr. Karenann Cai. TICI3 recanalization of the right M2/MCA achieved.  MRI/MRA shows reoccluded right M2 branch with a right MCA territory infarct.  Continue with PT/OT/SLP evals.  Home blood pressure medications resumed. BP goal less than 160. Topamax and Tylenol #3 for headache management.  Head CT yesterday AM shows new 3mm leftward midline shift.  Has been trasnferred back to ICU, decompressive hemicraniectomy performed by neurosurgery.  Now intubated and on HTS at 75cc/hr. ? ?Stroke:  Right MCA due to right MCA occlusion  s/p TNK and mechanical thrombectomy with TICI3 revascularization etiology not certain, but most likely cryptogenic versus intracranial atherosclerosis..  Repeat CT scan/24/23 shows malignant cerebral edema with 12 mm right to left midline shift and trapping of the temporal horn ?code Stroke CT head No acute abnormality ?CTA head & neck R short segment M1 occlusion, left distal M1 high grade stenosis ?Post IR CT No hemorrhagic complication on flat panel CT. ?Early MRI Tiny acute infarcts in the upper division right MCA cortex. ?MRI right MCA territory infarct involving the cortex, sparing the basal ganglia.  2 mm midline shift.  No hemorrhage ?MRA reoccluded right M2 branch, intracranial atherosclerosis including advanced left M1 stenosis ?2D Echo EF > 75% ?LDL 96 ?HgbA1c 8.1 ?UDS negative ?VTE prophylaxis - SCDs ?No antithrombotic prior to admission, now  on no antithrombotic secondary to recent surgery. ?Therapy recommendations: Inpatient rehab  ?disposition:  Pending ? ?Cerebral Edema (brain compression) ?Head CT 4/24 shows increased edema with new 41mm midline shift ?3% HTS to treat edema ?Na 147 this AM ?Decompressive hemicraniectomy performed 4/24 by neurosurgery ? ?Intracranial stenosis ?CTA head & neck R  short segment M1 occlusion, left distal M1 high grade stenosis ?No significant extracranial stenosis ?Most likely related to long standing uncontrolled risk factors. ? ?Hypertension ?Home meds:  Amlodipine 10mg , lisinopril 10mg -resumed ?Stable ?BP goal 160  ?Long-term BP goal normotensive ?Avoid low BP ?PRN labetalol and hydralazine ? ?Hyperlipidemia ?Home meds:  Atorvastatin 20mg  ?LDL 96, goal < 70 ?Put on lipitor 40 ?Continue statin at discharge ? ?Diabetes type II Uncontrolled ?Home meds:  Glipizide, metformin ?HgbA1c 8.1, goal < 7.0 ?CBGs ?SSI with resist scale, Levemir 11 units twice daily ?DM coordinator consulted ? ?Other Stroke Risk Factors ?Obesity, Body mass index is 36.07 kg/m?., BMI >/= 30 associated with increased stroke risk, recommend weight loss, diet and exercise as appropriate  ? ?Dysphagia  ?On dysphagia 2 diet with thin liquids ?Continue SLP ?Encourage p.o. intake ? ?Respiratory failure ?Patient left intubated after surgery ?Ventilator management per CCM ?Attempt to wean sedation and extubate soon ? ?Hospital day # 4 ? ?Patient seen and examined by NP/APP with MD. MD to update note as needed.  ? ?[place] , MSN, AGACNP-BC ?[facility] ?See Amion for schedule and pager information ?09/16/2021 11:13 AM ?  ?STROKE MD NOTE : I have personally obtained history,examined this patient, reviewed notes, independently viewed imaging studies, participated in medical decision making and plan of care.ROS completed by me personally and pertinent positives fully documented  I have made any additions or clarifications directly to the  above note. Agree with note above.  Patient developed neurological worsening with increasing sleepiness due to progressive cytotoxic edema yesterday requiring emergent decompressive hemicraniectomy.  He r

## 2021-09-16 NOTE — Assessment & Plan Note (Addendum)
Presently on mechanical ventilation for airway protection.  Chest x-ray is clear. Tolerating SBT but not managing oral secretions.  ? ?- Mental status will dictate timing of extubation. ?-Given severity of neurological symptoms will likely need tracheostomy. ?- Daily SBT ?

## 2021-09-16 NOTE — Progress Notes (Signed)
Peripherally Inserted Central Catheter Placement ? ?The IV Nurse has discussed with the patient and/or persons authorized to consent for the patient, the purpose of this procedure and the potential benefits and risks involved with this procedure.  The benefits include less needle sticks, lab draws from the catheter, and the patient may be discharged home with the catheter. Risks include, but not limited to, infection, bleeding, blood clot (thrombus formation), and puncture of an artery; nerve damage and irregular heartbeat and possibility to perform a PICC exchange if needed/ordered by physician.  Alternatives to this procedure were also discussed.  Bard Power PICC patient education guide, fact sheet on infection prevention and patient information card has been provided to patient /or left at bedside.   ? ?Consent obtained with wife at bedside ? ?PICC Placement Documentation  ?PICC Triple Lumen [id number] Right Cephalic 43 cm 3 cm (Active)  ?Indication for Insertion or Continuance of Line Vasoactive infusions 09/16/21 1200  ?Exposed Catheter (cm) 3 cm 09/16/21 1200  ?Site Assessment Clean, Dry, Intact 09/16/21 1200  ?Lumen #1 Status Flushed;Saline locked;Blood return noted 09/16/21 1200  ?Lumen #2 Status Flushed;Saline locked;Blood return noted 09/16/21 1200  ?Lumen #3 Status Flushed;Saline locked;Blood return noted 09/16/21 1200  ?Dressing Type Transparent;Securing device 09/16/21 1200  ?Dressing Status Antimicrobial disc in place 09/16/21 1200  ?Safety Lock Not Applicable [id number]  ?Line Care Connections checked and tightened 09/16/21 1200  ?Dressing Intervention New dressing 09/16/21 1200  ?Dressing Change Due 09/23/21 09/16/21 1200  ? ? ? ? ? ?Holley Bouche Renee ?09/16/2021, 12:41 PM ? ?

## 2021-09-16 NOTE — Progress Notes (Signed)
Pharmacy Electrolyte Replacement ? ?Recent Labs: ? ?Recent Labs  ?  09/16/21 ?0421 09/16/21 ?1550  ?K 3.5  --   ?MG  --  2.3  ?PHOS  --  1.3*  ?CREATININE 1.10  --   ? ? ?Low Critical Values (K </= 2.5, Phos </= 1, Mg </= 1) Present: ?None ? ?MD Contacted: n/a - no critical values noted ? ?Plan: KPhos 59mmol IV x 1 ?Recheck with AM labs ? ? ?Arturo Morton, PharmD, BCPS ?Please check AMION for all [place] contact numbers ?Clinical Pharmacist ?09/16/2021 5:41 PM ? ?

## 2021-09-16 NOTE — Progress Notes (Signed)
PT Cancellation Note ? ?Patient Details ?Name: TRENTYN EKHOLM ?MRN: [name] ?DOB: [birth year] ? ? ?Cancelled Treatment:    Reason Eval/Treat Not Completed: Medical issues which prohibited therapy (intubated/ sedated). ? ?Leighton Roach, PT  ?Acute Rehab Services ? Pager [phone] ?Office [phone] ? ? ? ?Jet ?09/16/2021, 1:08 PM ?

## 2021-09-16 NOTE — Progress Notes (Signed)
Subjective: ?Patient is intubated and heavily sedated.  He is in no apparent distress.  His wife is at the bedside. ? ?Objective: ?Vital signs in last 24 hours: ?Temp:  [98.2 ?F (36.8 ?C)-99.9 ?F (37.7 ?C)] 99.9 ?F (37.7 ?C) (04/25 0400) ?Pulse Rate:  [54-128] 102 (04/25 0700) ?Resp:  [18-32] 18 (04/25 0700) ?BP: (105-185)/(56-93) 120/68 (04/25 0700) ?SpO2:  [93 %-100 %] 99 % (04/25 0700) ?Arterial Line BP: (129-183)/(59-81) 129/59 (04/25 0700) ?FiO2 (%):  [40 %-100 %] 40 % (04/25 0331) ?Estimated body mass index is 36.07 kg/m? as calculated from the following: ?  Height as of this encounter: 6\' 1"  (1.854 m). ?  Weight as of this encounter: 124 kg. ? ? ?Intake/Output from previous day: ?04/24 0701 - 04/25 0700 ?In: [id number] [P.O.:120; I.V.:2915.1; Blood:362; IV Piggyback:349.9] ?Out: 2845 [Urine:2310; Drains:35; Blood:500] ?Intake/Output this shift: ?No intake/output data recorded. ? ?Physical exam patient is intubated and heavily sedated.  His pupils are myotic bilaterally.  He presently does not respond to stimuli.  His dressing is clean and dry. ? ?Lab Results: ?Recent Labs  ?  09/15/21 ?0559 09/15/21 ?1750 09/15/21 ?2023 09/16/21 ?0421  ?WBC 11.4*  --   --  17.9*  ?HGB 15.1   < > 12.9* 13.5  ?HCT 43.7   < > 38.0* 38.2*  ?PLT 145*  --   --  247  ? < > = values in this interval not displayed.  ? ?BMET ?Recent Labs  ?  09/15/21 ?0559 09/15/21 ?1324 09/15/21 ?2023 09/16/21 ?[id number] 09/16/21 ?0421  ?NA 142   < > 146* 144 145  ?K 3.3*   < > 3.7  --  3.5  ?CL 109  --   --   --  118*  ?CO2 25  --   --   --  20*  ?GLUCOSE 196*  --   --   --  282*  ?BUN 16  --   --   --  22*  ?CREATININE 1.14  --   --   --  1.10  ?CALCIUM 9.1  --   --   --  8.5*  ? < > = values in this interval not displayed.  ? ? ?Studies/Results: ?CT HEAD WO CONTRAST (5MM) ? ?Result Date: 09/15/2021 ?CLINICAL DATA:  Stroke follow-up, altered mental status EXAM: CT HEAD WITHOUT CONTRAST TECHNIQUE: Contiguous axial images were obtained from the base of the skull  through the vertex without intravenous contrast. RADIATION DOSE REDUCTION: This exam was performed according to the departmental dose-optimization program which includes automated exposure control, adjustment of the mA and/or kV according to patient size and/or use of iterative reconstruction technique. COMPARISON:  CT head 09/12/2021, MR head 09/13/2021 FINDINGS: Brain: There is ongoing evolution of the large right MCA distribution infarct which involves right external capsule/insula, anterior temporal lobe, and much of the frontal and parietal lobe cortex. There is increased edema resulting in near-complete effacement of the right lateral ventricle and 12 mm leftward midline shift, increased from 2 mm on the MRI from 2 days prior. There is mild enlargement of the right temporal horn raising suspicion for a trapped ventricle. There is no evidence of hemorrhagic transformation. There is no evidence of acute intracranial hemorrhage or extra-axial fluid collection. Vascular: No definite dense vessel sign is seen. Skull: Normal. Negative for fracture or focal lesion. Sinuses/Orbits: The paranasal sinuses are clear. The globes and orbits are unremarkable. Other: None. IMPRESSION: Ongoing evolution of the large right MCA distribution infarct with increased edema resulting in  near-complete effacement of the right lateral ventricle and 12 mm leftward midline shift, increased from 2 mm on the brain MRI from 2 days prior. Enlargement of the right temporal horn raises suspicion for a trapped ventricle. No evidence of hemorrhagic transformation. These results were called by telephone at the time of interpretation on 09/15/2021 at 10:23 am to provider Dr Leonie Man, who verbally acknowledged these results. Electronically Signed   By: Valetta Mole M.D.   On: 09/15/2021 10:34  ? ?DG Chest Port 1 View ? ?Result Date: 09/15/2021 ?CLINICAL DATA:  Endotracheal and orogastric tube. EXAM: PORTABLE CHEST 1 VIEW COMPARISON:  Chest x-ray  10/23/2013. FINDINGS: The endotracheal tube tip is 4.7 cm above the carina. Enteric tube tip is in the mid stomach. Cardiomediastinal silhouette is within normal limits. There is improved aeration in the left lung base. Small left pleural effusion persists. Right lung is clear. No pneumothorax or acute fracture. IMPRESSION: 1. Improved aeration in the left lung base with persistent small left pleural effusion. 2. The endotracheal tube tip is 4.7 cm above carina. 3. Enteric tube tip is in the mid stomach. Electronically Signed   By: Ronney Asters M.D.   On: 09/15/2021 23:07  ? ?DG CHEST PORT 1 VIEW ? ?Result Date: 09/15/2021 ?CLINICAL DATA:  Endotracheal tube placement. EXAM: PORTABLE CHEST 1 VIEW COMPARISON:  10/23/2013. FINDINGS: The heart size and mediastinal contours are within normal limits. There is hazy opacification of the left lung base. No pneumothorax is seen. No acute osseous abnormality. The enteric tube terminates at the carina and should be retracted approximately 3.5 cm. An enteric tube courses over the left upper quadrant and likely terminates in the stomach. IMPRESSION: 1. Hazy opacification at the left lung base, question small pleural effusion. 2. The endotracheal tube is at the carina and should be retracted approximately 3.5 cm. Electronically Signed   By: Brett Fairy M.D.   On: 09/15/2021 21:03   ? ?Assessment/Plan: ?Postop day #1 status post right decompressive craniectomy: I recommend continue 3% saline and supportive care.  I have spoken with his wife. ? LOS: 4 days  ? ? ? ?Jacob [name] ?09/16/2021, 8:04 AM ? ? ? ? ?Patient ID: Jacob [name], male   DOB: 1975/10/06, 46 y.o.   MRN: [id number] ? ?

## 2021-09-16 NOTE — Progress Notes (Addendum)
? ?NAME:  Jacob [name], MRN:  [id number], DOB:  [birth year], LOS: 4 ?ADMISSION DATE:  09/12/2021, CONSULTATION DATE:  09/15/2021 ?REFERRING MD:  Leonie Man - Stroke, CHIEF COMPLAINT:  Cerebral edema.   ? ?History of Present Illness:  ?46 year old man with worsening cerebral edema. ? ?He presented 3 days ago with left-sided weakness which was initially waxing and waning. CTA showed right M1 occlusion.  Further decline post TNK brought for thrombectomy at [facility] 3 revascularization of right M2. ?Transfer to floor but experienced neurological decline 4/24.  CT showed malignant cerebral edema with 12 mm right to left midline shift and Trapping of the temporal horn. ? ?He was brought back to the ICU and started on 3% hypertonic saline and prepped for the operating room.  Platelets were transfused to offset effect of clopidogrel and aspirin. ? ?Taken to OR and underwent right frontotemporal parietal hemicraniectomy with placement of bone flap in abdominal subcutaneous tissue. Returned to ICU on vent. ? ?Pertinent  Medical History  ? ?Past Medical History:  ?Diagnosis Date  ? Allergy   ? Diabetes mellitus without complication ([place])   ? Hypertension   ? ? ? ?Significant Hospital Events: ?Including procedures, antibiotic start and stop dates in addition to other pertinent events   ?4/24 decompressive craniectomy. ? ?Interim History / Subjective:  ?Vitals stable. ?Hemicraniectomy yesterday, tolerated well. ? ?Objective   ?Blood pressure 120/68, pulse (!) 126, temperature 99.9 ?F (37.7 ?C), temperature source Axillary, resp. rate (!) 24, height 6\' 1"  (1.854 m), weight 124 kg, SpO2 100 %. ?   ?Vent Mode: PRVC ?FiO2 (%):  [40 %-100 %] 40 % ?Set Rate:  [18 bmp] 18 bmp ?Vt Set:  [id number] mL] 630 mL ?PEEP:  [5 cmH20] 5 cmH20 ?Plateau Pressure:  [18 cmH20-23 cmH20] 19 cmH20  ? ?Intake/Output Summary (Last 24 hours) at 09/16/2021 0917 ?Last data filed at 09/16/2021 0700 ?Gross per 24 hour  ?Intake 3747.02 ml  ?Output 2925 ml  ?Net 822.02 ml   ? ? ?Filed Weights  ? 09/12/21 0303 09/12/21 0323 09/12/21 [id number]  ?Weight: 107 kg 107 kg 124 kg  ? ? ?Examination: ?General: Young adult male, resting in bed, in NAD. ?Neuro: Sedated, not responsive. ?HEENT: Right scalp dressings C//D/I. Sclerae anicteric. ETT in place. ?Cardiovascular: ? Soft holosystolic murmur. Slightly tachy.  ?Lungs: Respirations even and unlabored.  CTA bilaterally, No W/R/R. ?Abdomen: BS x 4, soft, NT/ND.  ?Musculoskeletal: No gross deformities, no edema.  ?Skin: Intact, warm, no rashes. ? ?Ancillary tests personally reviewed  ?CT scan from 4/24 shows large area of hypodensity in the right MCA territory with 1 cm midline shift. ?Sodium 142. ? ?Assessment & Plan:  ? ?Malignant R MCA CVA - initially s/p tPA and thrombectomy but unfortunately progressed and now s/p right frontotemporal parietal hemicraniectomy with placement of bone flap in abdominal subcutaneous tissue 4/24 and currently on 3% NS. ?- Post op care per NSGY. ?- Stroke management per stroke team. ?- Continue 3% NS with frequent Na checks. ? ?Post op respiratory insufficiency. ?- Continue full vent support. ?- Wean as mental status allows. ?- Bronchial hygiene. ?- Follow CXR. ? ?HTN, HLD. ?- Continue Cleviprex as needed for goal SBP < 160. ?- Hydralazine PRN, Labetalol PRN. ?- Continue home Amlodipine, Lisinopril, Atorvastatin. ?- Hold home Clonidine for now. ? ?Hx DM. ?- Continue SSI. ? ? ?Rest per primary team. ? ?Best Practice (right click and "Reselect all SmartList Selections" daily)  ?Per primary team. ? ? ?CC time: 30 min. ? ? ?  Montey Hora, PA - C ?[facility] ?For pager details, please see AMION or use Epic chat  ?After 1900, please call [facility] for cross coverage needs ?09/16/2021, 9:39 AM ? ? ? ? ? ?

## 2021-09-16 NOTE — TOC CAGE-AID Note (Signed)
Transition of Care (TOC) - CAGE-AID Screening ? ? ?Patient Details  ?Name: Jacob [name] ?MRN: [id number] ?Date of Birth: 1975/09/07 ? ?Transition of Care (TOC) CM/SW Contact:    ?Landon Truax C Tarpley-Carter, [name] ?Phone Number: ?09/16/2021, 2:18 PM ? ? ?Clinical Narrative: ?Pt is unable to participate in Cage Aid.  Pt is intubated and sedated.  CSW will assess at a better time. ? ?Passenger transport manager, MSW, LCSW-A ?Pronouns:  She/Her/Hers ?[facility] of [facility] ?Clinical Social Worker ?Direct Number:  [phone] ?[email or web address] ? ? ?CAGE-AID Screening: ?Substance Abuse Screening unable to be completed due to: : Patient unable to participate ? ?  ?  ?  ?  ?  ? ?  ? ?  ? ? ? ? ? ? ?

## 2021-09-17 ENCOUNTER — Inpatient Hospital Stay (HOSPITAL_COMMUNITY): Payer: BC Managed Care – PPO

## 2021-09-17 DIAGNOSIS — I63511 Cerebral infarction due to unspecified occlusion or stenosis of right middle cerebral artery: Secondary | ICD-10-CM | POA: Diagnosis not present

## 2021-09-17 DIAGNOSIS — R4182 Altered mental status, unspecified: Secondary | ICD-10-CM | POA: Diagnosis not present

## 2021-09-17 LAB — GLUCOSE, CAPILLARY
Glucose-Capillary: 335 mg/dL — ABNORMAL HIGH (ref 70–99)
Glucose-Capillary: 314 mg/dL — ABNORMAL HIGH (ref 70–99)
Glucose-Capillary: 286 mg/dL — ABNORMAL HIGH (ref 70–99)
Glucose-Capillary: 244 mg/dL — ABNORMAL HIGH (ref 70–99)
Glucose-Capillary: 266 mg/dL — ABNORMAL HIGH (ref 70–99)
Glucose-Capillary: 250 mg/dL — ABNORMAL HIGH (ref 70–99)

## 2021-09-17 LAB — BASIC METABOLIC PANEL
Anion gap: 4 — ABNORMAL LOW (ref 5–15)
BUN: 21 mg/dL — ABNORMAL HIGH (ref 6–20)
CO2: 21 mmol/L — ABNORMAL LOW (ref 22–32)
Calcium: 8.1 mg/dL — ABNORMAL LOW (ref 8.9–10.3)
Chloride: 125 mmol/L — ABNORMAL HIGH (ref 98–111)
Creatinine, Ser: 1 mg/dL (ref 0.61–1.24)
GFR, Estimated: >60 mL/min (ref >60)
Glucose, Bld: 342 mg/dL — ABNORMAL HIGH (ref 70–99)
Potassium: 4.2 mmol/L (ref 3.5–5.1)
Sodium: 150 mmol/L — ABNORMAL HIGH (ref 135–145)

## 2021-09-17 LAB — PHOSPHORUS
Phosphorus: 2.5 mg/dL (ref 2.5–4.6)
Phosphorus: 2.5 mg/dL (ref 2.5–4.6)

## 2021-09-17 LAB — MAGNESIUM
Magnesium: 2.3 mg/dL (ref 1.7–2.4)
Magnesium: 2.5 mg/dL — ABNORMAL HIGH (ref 1.7–2.4)

## 2021-09-17 LAB — SODIUM
Sodium: 150 mmol/L — ABNORMAL HIGH (ref 135–145)
Sodium: 153 mmol/L — ABNORMAL HIGH (ref 135–145)
Sodium: 155 mmol/L — ABNORMAL HIGH (ref 135–145)

## 2021-09-17 LAB — CBC
HCT: 34.6 % — ABNORMAL LOW (ref 39.0–52.0)
Hemoglobin: 12.2 g/dL — ABNORMAL LOW (ref 13.0–17.0)
MCH: 30.7 pg (ref 26.0–34.0)
MCHC: 35.3 g/dL (ref 30.0–36.0)
MCV: 86.9 fL (ref 80.0–100.0)
Platelets: 176 10*3/uL (ref 150–400)
RBC: 3.98 MIL/uL — ABNORMAL LOW (ref 4.22–5.81)
RDW: 13.9 % (ref 11.5–15.5)
WBC: 14.7 10*3/uL — ABNORMAL HIGH (ref 4.0–10.5)
nRBC: 0 % (ref 0.0–0.2)

## 2021-09-17 LAB — PLATELET INHIBITION P2Y12

## 2021-09-17 IMAGING — DX DG ABD PORTABLE 1V
1 series · 1 of 1 positions shown · non-contrast
Comparison: Abdominal CT [DATE].  Head CT [DATE].

CLINICAL DATA: Feeding tube placement.

EXAM:
PORTABLE ABDOMEN - 1 VIEW

[abdomen kub]
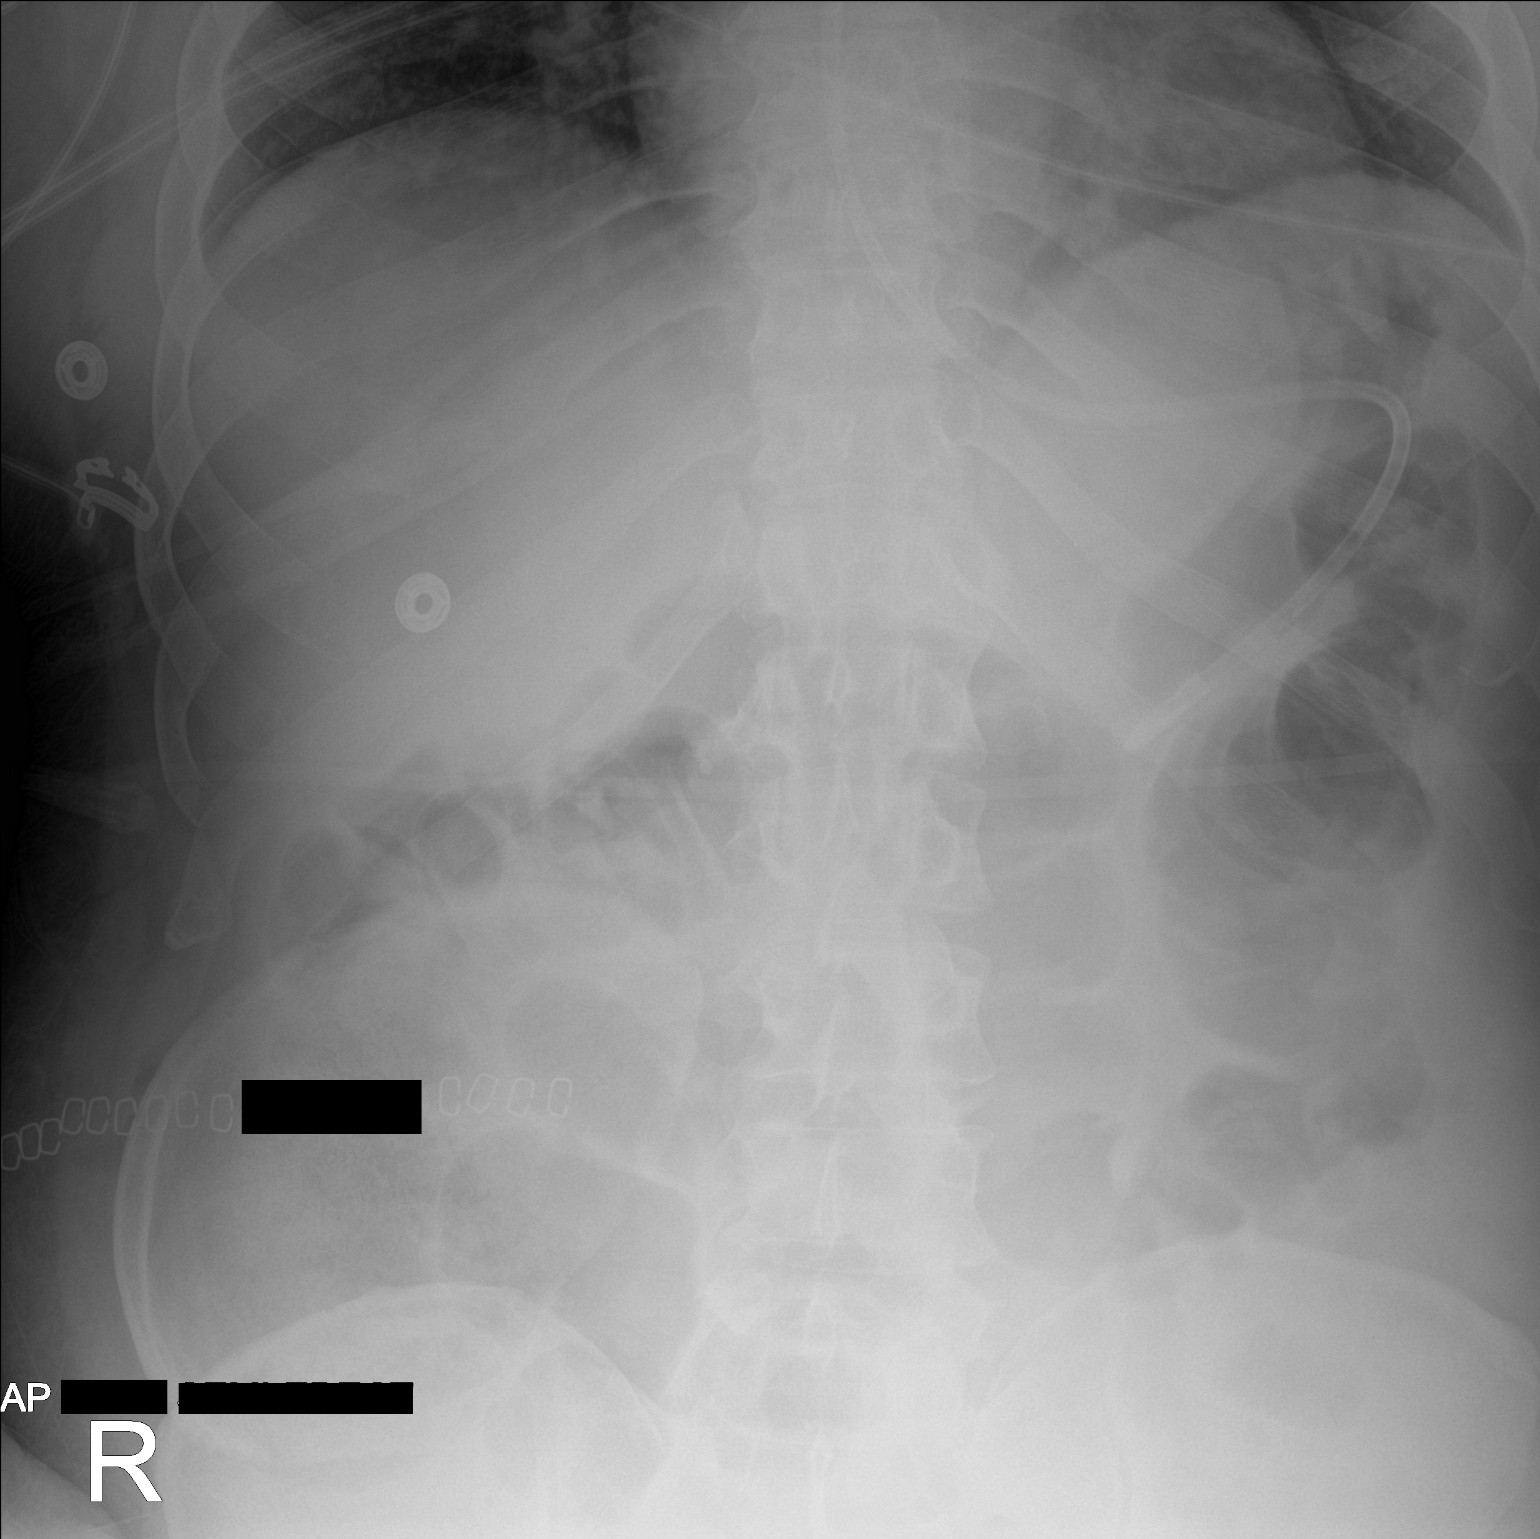

[1 of 1 positions shown; findings below may reference images not displayed]

FINDINGS: [2Z] hours. Tip of the feeding tube projects over the left upper
quadrant of the abdomen consistent with position in the mid stomach.
The bowel gas pattern is nonobstructive. Postsurgical changes are
present in the right mid abdomen related to recent right craniotomy
and abdominal bone flap placement.
IMPRESSION: Feeding tube projects to the level of the mid stomach.

## 2021-09-17 IMAGING — DX DG CHEST 1V PORT
1 series · 1 of 1 positions shown · non-contrast
Comparison: [DATE]

CLINICAL DATA: Respiratory failure/ETT

EXAM:
PORTABLE CHEST 1 VIEW

[chest]
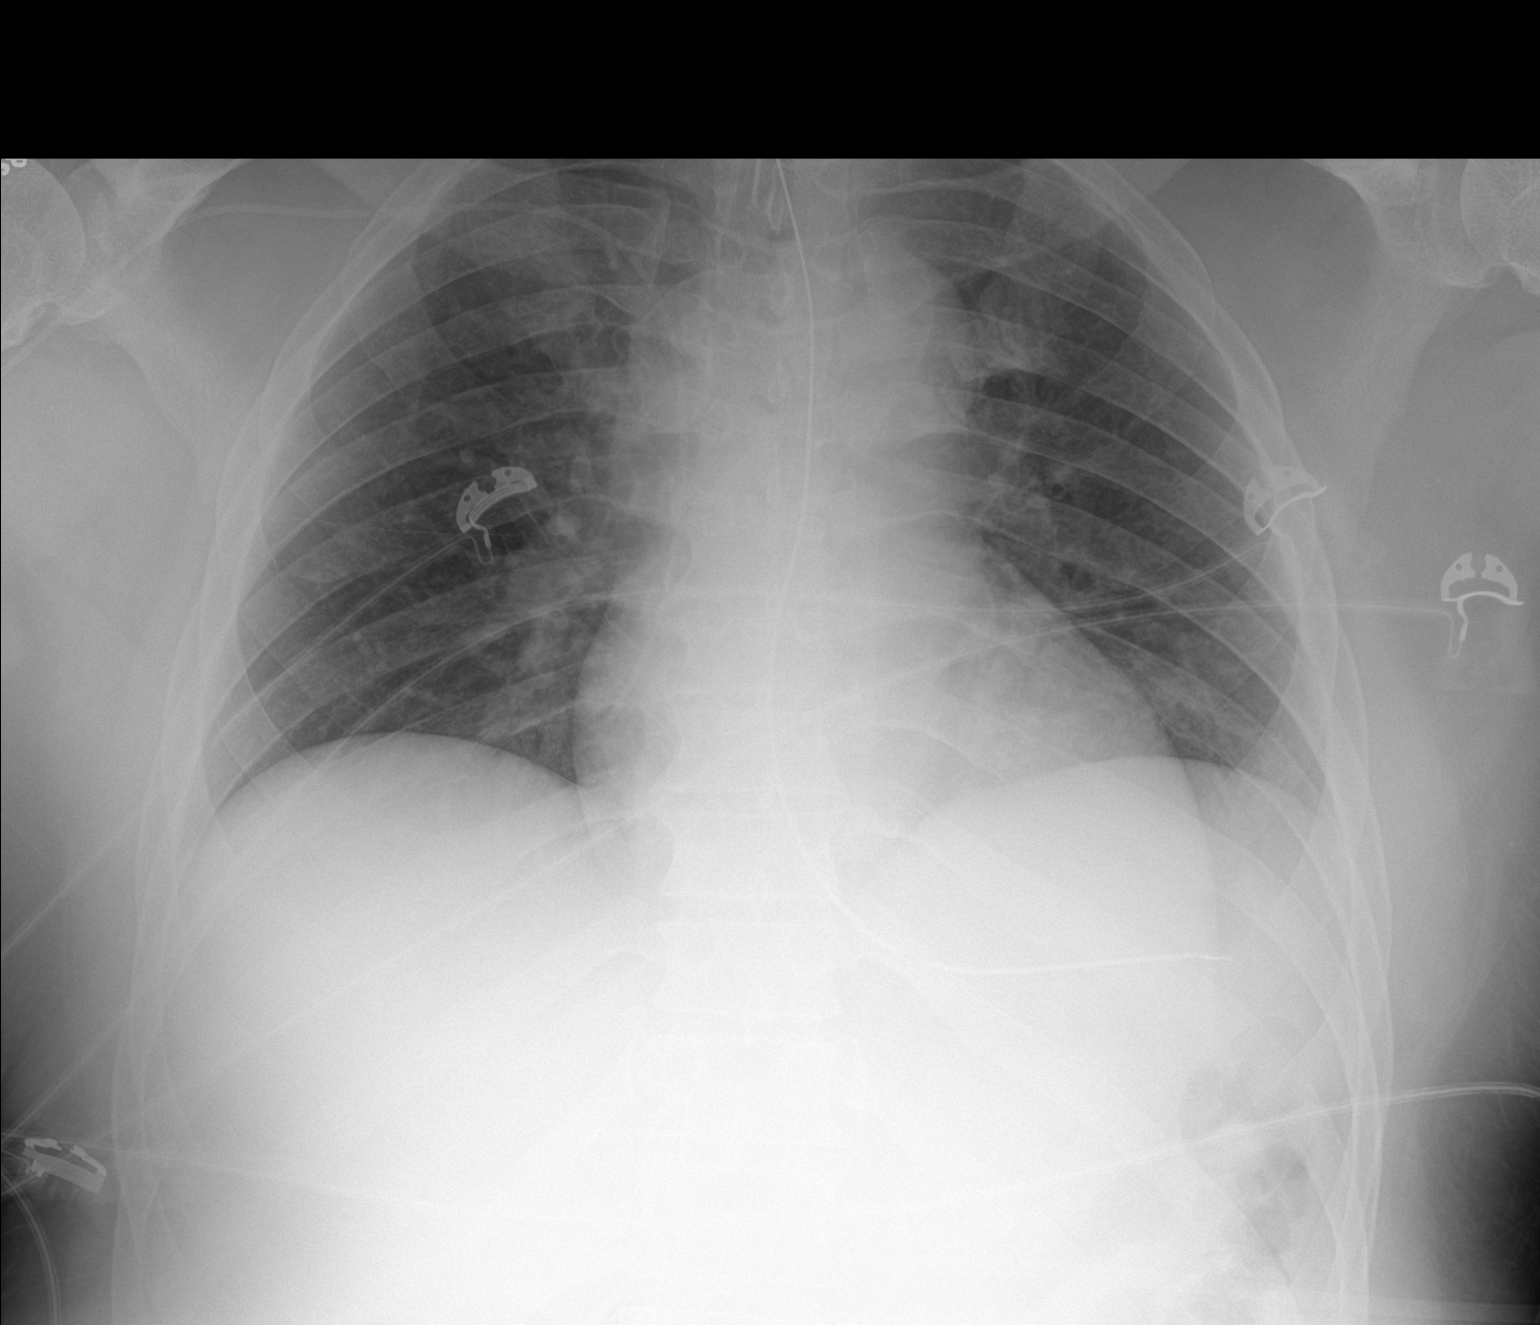

[1 of 1 positions shown; findings below may reference images not displayed]

FINDINGS: Unchanged cardiomediastinal silhouette. Endotracheal tube tip
overlies the upper trachea between the clavicles. Orogastric tube
tip and side port overlie the stomach. Right upper extremity PICC
tip overlies the right atrium. Unchanged faint opacities in the left
lung base. No new airspace disease. No pneumothorax. No large
pleural effusion.
IMPRESSION: Unchanged faint opacities in the left lung base. No new airspace
disease.

Endotracheal tube overlies the upper trachea at the level of the
clavicles.

## 2021-09-17 MED ORDER — CLONIDINE HCL 0.1 MG PO TABS
0.1000 mg | ORAL_TABLET | Freq: Four times a day (QID) | ORAL | Status: DC | PRN
  Filled 2021-09-17: qty 1

## 2021-09-17 MED ORDER — SODIUM CHLORIDE 3 % IV SOLN: INTRAVENOUS | Status: DC

## 2021-09-17 MED ORDER — MIDAZOLAM HCL 2 MG/2ML IJ SOLN
1.0000 mg | INTRAMUSCULAR | Status: AC | PRN
  Administered 2021-09-17: 2 mg via INTRAVENOUS
  Filled 2021-09-17: qty 2

## 2021-09-17 MED ORDER — FENTANYL BOLUS VIA INFUSION
50.0000 ug | INTRAVENOUS | Status: DC | PRN
  Administered 2021-09-17: 100 ug via INTRAVENOUS
  Administered 2021-09-17: 50 ug via INTRAVENOUS
  Filled 2021-09-17: qty 100

## 2021-09-17 MED ORDER — POLYETHYLENE GLYCOL 3350 17 G PO PACK
17.0000 g | PACK | Freq: Every day | ORAL | Status: DC
  Administered 2021-09-17 – 2021-09-24 (×6): 17 g
  Filled 2021-09-17 (×7): qty 1

## 2021-09-17 MED ORDER — LISINOPRIL 20 MG PO TABS
40.0000 mg | ORAL_TABLET | Freq: Every day | ORAL | Status: DC
  Administered 2021-09-18 – 2021-10-08 (×21): 40 mg
  Filled 2021-09-17 (×21): qty 2

## 2021-09-17 MED ORDER — FENTANYL 2500MCG IN NS 250ML (10MCG/ML) PREMIX INFUSION
50.0000 ug/h | INTRAVENOUS | Status: DC
  Administered 2021-09-17: 50 ug/h via INTRAVENOUS
  Administered 2021-09-18 (×2): 100 ug/h via INTRAVENOUS
  Filled 2021-09-17 (×2): qty 250

## 2021-09-17 MED ORDER — GLUCERNA 1.5 CAL PO LIQD
1000.0000 mL | ORAL | Status: DC
Start: 2021-09-17 — End: 2021-09-23
  Administered 2021-09-17 – 2021-09-21 (×5): 1000 mL
  Filled 2021-09-17 (×9): qty 1000

## 2021-09-17 MED ORDER — INSULIN DETEMIR 100 UNIT/ML ~~LOC~~ SOLN
10.0000 [IU] | Freq: Once | SUBCUTANEOUS | Status: AC
  Administered 2021-09-17: 10 [IU] via SUBCUTANEOUS
  Filled 2021-09-17: qty 0.1

## 2021-09-17 MED ORDER — FENTANYL CITRATE PF 50 MCG/ML IJ SOSY: 50.0000 ug | PREFILLED_SYRINGE | Freq: Once | INTRAMUSCULAR | Status: DC

## 2021-09-17 MED ORDER — ATORVASTATIN CALCIUM 40 MG PO TABS
40.0000 mg | ORAL_TABLET | Freq: Every day | ORAL | Status: DC
  Administered 2021-09-17 – 2021-10-08 (×22): 40 mg
  Filled 2021-09-17 (×22): qty 1

## 2021-09-17 MED ORDER — LISINOPRIL 20 MG PO TABS
30.0000 mg | ORAL_TABLET | Freq: Once | ORAL | Status: DC
  Filled 2021-09-17: qty 1

## 2021-09-17 MED ORDER — INSULIN DETEMIR 100 UNIT/ML ~~LOC~~ SOLN
30.0000 [IU] | Freq: Two times a day (BID) | SUBCUTANEOUS | Status: DC
  Administered 2021-09-17 – 2021-09-18 (×3): 30 [IU] via SUBCUTANEOUS
  Filled 2021-09-17 (×5): qty 0.3

## 2021-09-17 MED ORDER — PROSOURCE TF PO LIQD
45.0000 mL | Freq: Every day | ORAL | Status: DC
  Administered 2021-09-18 – 2021-09-23 (×5): 45 mL
  Filled 2021-09-17 (×6): qty 45

## 2021-09-17 MED ORDER — LEVETIRACETAM 100 MG/ML PO SOLN
500.0000 mg | Freq: Two times a day (BID) | ORAL | Status: DC
  Administered 2021-09-17 – 2021-09-24 (×15): 500 mg
  Filled 2021-09-17 (×14): qty 5

## 2021-09-17 MED ORDER — LISINOPRIL 20 MG PO TABS
30.0000 mg | ORAL_TABLET | Freq: Once | ORAL | Status: AC
  Administered 2021-09-17: 30 mg

## 2021-09-17 MED ORDER — PANTOPRAZOLE 2 MG/ML SUSPENSION
40.0000 mg | Freq: Every day | ORAL | Status: DC
  Administered 2021-09-17 – 2021-09-24 (×8): 40 mg
  Filled 2021-09-17 (×8): qty 20

## 2021-09-17 MED ORDER — DOCUSATE SODIUM 50 MG/5ML PO LIQD
100.0000 mg | Freq: Two times a day (BID) | ORAL | Status: DC
  Administered 2021-09-17 – 2021-09-24 (×10): 100 mg
  Filled 2021-09-17 (×10): qty 10

## 2021-09-17 MED ORDER — CLONIDINE HCL 0.1 MG PO TABS
0.1000 mg | ORAL_TABLET | Freq: Four times a day (QID) | ORAL | Status: DC | PRN
  Administered 2021-09-17: 0.1 mg

## 2021-09-17 NOTE — Progress Notes (Signed)
PT Cancellation Note ? ?Patient Details ?Name: Jacob [name] ?MRN: [id number] ?DOB: 09/26/1975 ? ? ?Cancelled Treatment:    Reason Eval/Treat Not Completed: Medical issues which prohibited therapy. Pt remains intubated and on bedrest. PT will continue to follow and reinitiate mobility once medically ready. ? ? ?Zenaida Niece ?09/17/2021, 9:48 AM ?

## 2021-09-17 NOTE — Progress Notes (Signed)
eLink Physician-Brief Progress Note ?Patient Name: Jacob [name] ?DOB: 10-Oct-1975 ?MRN: [id number] ? ? ?Date of Service ? 09/17/2021  ?HPI/Events of Note ? Patient with sub-optimal BP control despite maximum rate Cleviprex gtt and PRN Labetalol iv (as well as oral Norvasc and Lisinopril, it's not clear whether this is a sedation problem on the ventilator vs resistant hypertension. He is only on 100 mcg Fentanyl gtt.  ?eICU Interventions ? Will order low dose PRN Versed to test if the issue is one of inadequate sedation on the ventilator, PRN Catapres via NG tube also added.  ? ? ? ?  ? ?Kerry Kass Aarvi Stotts ?09/17/2021, 10:46 PM ?

## 2021-09-17 NOTE — Progress Notes (Signed)
Initial Nutrition Assessment ? ?DOCUMENTATION CODES:  ? ?Not applicable ? ?INTERVENTION:  ? ?Tube feeding via cortrak tube: ?Glucerna 1.5 at 60 ml/h (1440 ml per day) ?Prosource TF 45 ml daily  ? ?Provides 2200 kcal, 129 gm protein, 1094 ml free water daily ? ? ?NUTRITION DIAGNOSIS:  ? ?Inadequate oral intake related to inability to eat as evidenced by NPO status. ? ?GOAL:  ? ?Patient will meet greater than or equal to 90% of their needs ? ?MONITOR:  ? ?TF tolerance ? ?REASON FOR ASSESSMENT:  ? ?Consult ?Enteral/tube feeding initiation and management ? ?ASSESSMENT:  ? ?Pt with PMH of HTN and DM admitted with R M1 occlusion s/p TNK and thrombectomy. ? ?Pt discussed during ICU rounds and with RN.  ?Pt intubated on vent support.  ? ?4/24 s/p R frontotemporal parietal hemicraniectomy, bone flap in abd ?4/25 TF initiated  ?4/26 s/p cortrak placement; tip gastric  ? ?Medications reviewed and include: colace,SSI, novolog, levemir, protonix, miralax ?Fentanyl  ?Hypertonic saline ? ?Cleviprex @ 64 ml/hr provides: 3072 kcal  ? ?Labs reviewed: Na 153 ?A1C: 8.1 (4/21) ?CBG's: [id number]  ? ?Current TF:  ?Vital High Protein @ 40 ml/hr with 45 ml ProSource TF BID ?Provides: 1040 kcal and 106 grams protein  ? ?NUTRITION - FOCUSED PHYSICAL EXAM: ? ?Flowsheet Row Most Recent Value  ?Orbital Region No depletion  ?Upper Arm Region No depletion  ?Thoracic and Lumbar Region No depletion  ?Buccal Region No depletion  ?Temple Region No depletion  ?Clavicle Bone Region No depletion  ?Clavicle and Acromion Bone Region No depletion  ?Scapular Bone Region No depletion  ?Dorsal Hand No depletion  ?Patellar Region No depletion  ?Anterior Thigh Region No depletion  ?Posterior Calf Region No depletion  ?Edema (RD Assessment) None  ?Hair Reviewed  ?Eyes Unable to assess  ?Mouth Unable to assess  ?Skin Reviewed  ?Nails Reviewed  ? ?  ? ? ?Diet Order:   ?Diet Order   ? ?       ?  Diet NPO time specified  Diet effective now       ?  ? ?  ?  ? ?   ? ? ?EDUCATION NEEDS:  ? ?Not appropriate for education at this time ? ?Skin:  Skin Assessment: Reviewed RN Assessment (head incision, abd incision for bone flap) ? ?Last BM:  unknown ? ?Height:  ? ?Ht Readings from Last 1 Encounters:  ?09/12/21 6\' 1"  (1.854 m)  ? ? ?Weight:  ? ?Wt Readings from Last 1 Encounters:  ?09/17/21 105.1 kg  ? ? ?BMI:  Body mass index is 30.57 kg/m?. ? ?Estimated Nutritional Needs:  ? ?Kcal:  2100-2500 ? ?Protein:  110-125 grams ? ?Fluid:  >2 L/day ? ?Lockie Pares., RD, LDN, CNSC ?See AMiON for contact information  ? ?

## 2021-09-17 NOTE — Progress Notes (Signed)
SLP Cancellation Note ? ?Patient Details ?Name: RAYNOLD MANGAS ?MRN: [id number] ?DOB: [birth year] ? ? ?Cancelled treatment:       Reason Eval/Treat Not Completed: Medical issues which prohibited therapy (Pt remains on the vent; per neurology's note on 4/25, pt may be extubated today; PCCM's note on 4/25 indicates that trach may be necessary. SLP will follow up.) ? ?Shawnte Winton I. Hardin Negus, [place], CCC-SLP ?Acute [facility] Services ?Office number [phone] ?Pager [phone] ? ?[name] ?09/17/2021, 8:11 AM ?

## 2021-09-17 NOTE — Progress Notes (Signed)
Subjective: ?The patient is intubated and sedated.  He is in no apparent distress.  His wife is at the bedside. ? ?Objective: ?Vital signs in last 24 hours: ?Temp:  [99.1 ?F (37.3 ?C)-101.3 ?F (38.5 ?C)] 99.3 ?F (37.4 ?C) (04/26 0756) ?Pulse Rate:  [82-126] 82 (04/26 0756) ?Resp:  [16-25] 18 (04/26 0756) ?BP: (115-170)/(46-77) 170/69 (04/26 0756) ?SpO2:  [95 %-100 %] 100 % (04/26 0756) ?Arterial Line BP: (121-226)/(22-73) 131/59 (04/26 0700) ?FiO2 (%):  [40 %] 40 % (04/26 0756) ?Weight:  [105.1 kg] 105.1 kg (04/26 0403) ?Estimated body mass index is 30.57 kg/m? as calculated from the following: ?  Height as of this encounter: 6\' 1"  (1.854 m). ?  Weight as of this encounter: 105.1 kg. ? ? ?Intake/Output from previous day: ?04/25 0701 - 04/26 0700 ?In: 4603.7 [I.V.:3143.6; NG/GT:445; IV Piggyback:1015.1] ?Out: 2405 [Urine:2405] ?Intake/Output this shift: ?No intake/output data recorded. ? ?Physical exam the patient's left pupil is approximately 2 mm.  His right eye is swollen shut I cannot see his right pupil.  The patient is purposeful with his right upper extremity. ? ?His craniotomy wound is healing well.  The patient has drainage from the drain exit site.  The drain has not put out much.  I removed the drain and placed 2 staples. ? ?I reviewed the patient's follow-up head CT performed yesterday.  There is less right to left midline shift.  He has a new left cerebellar infarct. ? ?Lab Results: ?Recent Labs  ?  09/16/21 ?0421 09/17/21 ?0408  ?WBC 17.9* 14.7*  ?HGB 13.5 12.2*  ?HCT 38.2* 34.6*  ?PLT 247 176  ? ?BMET ?Recent Labs  ?  09/16/21 ?0421 09/16/21 ?0831 09/16/21 ?[id number] 09/17/21 ?0408  ?NA 145   < > 150* 150*  ?K 3.5  --   --  4.2  ?CL 118*  --   --  125*  ?CO2 20*  --   --  21*  ?GLUCOSE 282*  --   --  342*  ?BUN 22*  --   --  21*  ?CREATININE 1.10  --   --  1.00  ?CALCIUM 8.5*  --   --  8.1*  ? < > = values in this interval not displayed.  ? ? ?Studies/Results: ?CT HEAD WO CONTRAST (5MM) ? ?Result Date:  09/16/2021 ?CLINICAL DATA:  Stroke, follow up EXAM: CT HEAD WITHOUT CONTRAST TECHNIQUE: Contiguous axial images were obtained from the base of the skull through the vertex without intravenous contrast. RADIATION DOSE REDUCTION: This exam was performed according to the departmental dose-optimization program which includes automated exposure control, adjustment of the mA and/or kV according to patient size and/or use of iterative reconstruction technique. COMPARISON:  None. FINDINGS: Brain: Status post right frontal craniotomy. No substantial change in large right MCA territory infarct with extensive associated edema and areas of petechial hemorrhage. No mass occupying acute hemorrhage. Improved mass effect and 5 mm of leftward midline shift (previously 12 mm) at the foramen of Monro. Subjacent to the craniotomy there is suspected extra-axial hemorrhage posteriorly, measuring up to 11 mm in thickness posteriorly. A surgical drain is in place. New area of hypoattenuation in the left middle cerebellar peduncle and cerebellum (for example, see series 3, images 10/11. Streak artifact limits assessment in this region. Improved rounding of the left temporal horn, compatible with improved ventricular entrapment. Vascular: Hyperdense right M2 MCA vessels, compatible with known occlusion. Skull: Right craniotomy Sinuses/Orbits: Mild paranasal sinus mucosal thickening no acute orbital findings. Other: No mastoid effusions.  IMPRESSION: 1. New area of hypoattenuation in the left middle cerebellar peduncle and cerebellum. This finding raises concern for new/interval acute infarct, although streak artifact limits assessment in this region. An MRI could further evaluate if clinically warranted. 2. No substantial change in appearance of an evolving large right MCA territory infarct with petechial hemorrhage. Interval right craniotomy with improved mass effect and 5 mm of leftward midline shift (previously 12 mm) at the foramen of  Monro. Subjacent to the craniotomy there is suspected extra-axial hemorrhage posteriorly, measuring up to 11 mm in thickness posteriorly. A surgical drain is in place. 3. Improved rounding of the left temporal horn, compatible with improved ventricular entrapment. 4. Hyperdense right M2 MCA vessels, compatible with known occlusion Electronically Signed   By: Margaretha Sheffield M.D.   On: 09/16/2021 15:27  ? ?CT HEAD WO CONTRAST (5MM) ? ?Result Date: 09/15/2021 ?CLINICAL DATA:  Stroke follow-up, altered mental status EXAM: CT HEAD WITHOUT CONTRAST TECHNIQUE: Contiguous axial images were obtained from the base of the skull through the vertex without intravenous contrast. RADIATION DOSE REDUCTION: This exam was performed according to the departmental dose-optimization program which includes automated exposure control, adjustment of the mA and/or kV according to patient size and/or use of iterative reconstruction technique. COMPARISON:  CT head 09/12/2021, MR head 09/13/2021 FINDINGS: Brain: There is ongoing evolution of the large right MCA distribution infarct which involves right external capsule/insula, anterior temporal lobe, and much of the frontal and parietal lobe cortex. There is increased edema resulting in near-complete effacement of the right lateral ventricle and 12 mm leftward midline shift, increased from 2 mm on the MRI from 2 days prior. There is mild enlargement of the right temporal horn raising suspicion for a trapped ventricle. There is no evidence of hemorrhagic transformation. There is no evidence of acute intracranial hemorrhage or extra-axial fluid collection. Vascular: No definite dense vessel sign is seen. Skull: Normal. Negative for fracture or focal lesion. Sinuses/Orbits: The paranasal sinuses are clear. The globes and orbits are unremarkable. Other: None. IMPRESSION: Ongoing evolution of the large right MCA distribution infarct with increased edema resulting in near-complete effacement of the  right lateral ventricle and 12 mm leftward midline shift, increased from 2 mm on the brain MRI from 2 days prior. Enlargement of the right temporal horn raises suspicion for a trapped ventricle. No evidence of hemorrhagic transformation. These results were called by telephone at the time of interpretation on 09/15/2021 at 10:23 am to provider Dr Leonie Man, who verbally acknowledged these results. Electronically Signed   By: Valetta Mole M.D.   On: 09/15/2021 10:34  ? ?DG Chest Port 1 View ? ?Result Date: 09/17/2021 ?CLINICAL DATA:  Respiratory failure/ETT EXAM: PORTABLE CHEST 1 VIEW COMPARISON:  09/15/2021 FINDINGS: Unchanged cardiomediastinal silhouette. Endotracheal tube tip overlies the upper trachea between the clavicles. Orogastric tube tip and side port overlie the stomach. Right upper extremity PICC tip overlies the right atrium. Unchanged faint opacities in the left lung base. No new airspace disease. No pneumothorax. No large pleural effusion. IMPRESSION: Unchanged faint opacities in the left lung base. No new airspace disease. Endotracheal tube overlies the upper trachea at the level of the clavicles. Electronically Signed   By: Maurine Simmering M.D.   On: 09/17/2021 08:05  ? ?DG Chest Port 1 View ? ?Result Date: 09/15/2021 ?CLINICAL DATA:  Endotracheal and orogastric tube. EXAM: PORTABLE CHEST 1 VIEW COMPARISON:  Chest x-ray 10/23/2013. FINDINGS: The endotracheal tube tip is 4.7 cm above the carina. Enteric tube tip is  in the mid stomach. Cardiomediastinal silhouette is within normal limits. There is improved aeration in the left lung base. Small left pleural effusion persists. Right lung is clear. No pneumothorax or acute fracture. IMPRESSION: 1. Improved aeration in the left lung base with persistent small left pleural effusion. 2. The endotracheal tube tip is 4.7 cm above carina. 3. Enteric tube tip is in the mid stomach. Electronically Signed   By: Ronney Asters M.D.   On: 09/15/2021 23:07  ? ?DG CHEST PORT 1  VIEW ? ?Result Date: 09/15/2021 ?CLINICAL DATA:  Endotracheal tube placement. EXAM: PORTABLE CHEST 1 VIEW COMPARISON:  10/23/2013. FINDINGS: The heart size and mediastinal contours are within normal limits. Ther

## 2021-09-17 NOTE — Progress Notes (Signed)
Inpatient Diabetes Program Recommendations ? ?AACE/ADA: New Consensus Statement on Inpatient Glycemic Control (2015) ? ?Target Ranges:  Prepandial:   less than 140 mg/dL ?     Peak postprandial:   less than 180 mg/dL (1-2 hours) ?     Critically ill patients:  140 - 180 mg/dL  ? ?Lab Results  ?Component Value Date  ? GLUCAP 314 (H) 09/17/2021  ? HGBA1C 8.1 (H) 09/12/2021  ? ? ?Review of Glycemic Control ? Latest Reference Range & Units 09/16/21 07:45 09/16/21 11:05 09/16/21 15:51 09/16/21 19:44 09/16/21 23:20 09/17/21 03:34 09/17/21 07:36  ?Glucose-Capillary 70 - 99 mg/dL 284 (H) 238 (H) 214 (H) 257 (H) 307 (H) 335 (H) 314 (H)  ?(H): Data is abnormally high ? ?Diabetes history: DM2 ?Outpatient Diabetes medications: Glipizide 10 mg BID and Metformin 1000 mg BID ?Current orders for Inpatient glycemic control: Levemir 20 units BID, Novolog 0-20 units Q4H, Novolog 6 units Q4H, Vital HP at 40 ml/hr ? ?Inpatient Diabetes Program Recommendations:   ? ?Novolog 10 units Q4H tube feed coverage.  Stop if feeds held or discontinued.   ? ?Will continue to follow while inpatient. ? ?Thank you, ?Reche Dixon, MSN, RN ?Diabetes Coordinator ?Inpatient Diabetes Program ?[phone] (team pager from 8a-5p) ? ? ? ? ? ?

## 2021-09-17 NOTE — Progress Notes (Signed)
OT Cancellation Note ? ?Patient Details ?Name: Jacob [name] ?MRN: [id number] ?DOB: 15-Nov-1975 ? ? ?Cancelled Treatment:    Reason Eval/Treat Not Completed: Medical issues which prohibited therapy.  OT to continue efforts as appropriate.   ? ?Hoda Hon D Ieasha Boerema ?09/17/2021, 11:09 AM ?

## 2021-09-17 NOTE — TOC CAGE-AID Note (Signed)
Transition of Care (TOC) - CAGE-AID Screening ? ? ?Patient Details  ?Name: Jacob [name] ?MRN: [id number] ?Date of Birth: September 08, 1975 ? ?Transition of Care (TOC) CM/SW Contact:    ?[name], [name] ?Phone Number: ?09/17/2021, 1:37 PM ? ? ?Clinical Narrative: ?Pt is unable to participate in Cage Aid. ?CSW will assess at a better time. ? ?Passenger transport manager, MSW, LCSW-A ?Pronouns:  She/Her/Hers ?[facility] of [facility] ?Clinical Social Worker ?Direct Number:  [phone] ?[name] ? ?CAGE-AID Screening: ?Substance Abuse Screening unable to be completed due to: : Patient unable to participate ? ?  ?  ?  ?  ?  ? ?  ? ?  ? ? ? ? ? ? ?

## 2021-09-17 NOTE — Procedures (Signed)
Cortrak ? ?Person Inserting Tube:  Marveen Reeks, RD ?Tube Type:  Cortrak - 43 inches ?Tube Size:  140 ?Tube Location:  Left nare ?Secured by: Dorann Lodge ?Technique Used to Measure Tube Placement:  Marking at nare/corner of mouth ?Cortrak Secured At:  65 cm ? ?Cortrak Tube Team Note: ? ?Consult received to place a Cortrak feeding tube.  ? ?X-ray is required, abdominal x-ray has been ordered by the Cortrak team. Please confirm tube placement before using the Cortrak tube.  ? ?If the tube becomes dislodged please keep the tube and contact the Cortrak team at [email or web address] (password [email or web address]) for replacement.  ?If after hours and replacement cannot be delayed, place a NG tube and confirm placement with an abdominal x-ray.  ? ? ?Hermina Barters RD, LDN ?Clinical Dietitian ?See AMiON for contact information.  ? ? ?

## 2021-09-17 NOTE — Progress Notes (Addendum)
STROKE TEAM PROGRESS NOTE  ? ?INTERVAL HISTORY ?Patient is seen in his room with his wife at the bedside.   Jacob [name] Kitchen  He remains on 3% HTS and is intubated but is being weaned off ventilatory support. Jacob [name] Kitchen  He demonstrates purposeful movement of right side.  Serum sodium is 151 on hypertonic saline drip at 50 cc/h.  PCCM switching propofol to Precedex and fentanyl for pain management and sedation weaning.  Increase p.o. BP meds in order to titrate down Cleviprex.  He is having a lot of oral secretions and difficulty protecting his airway hence will not try to extubate until this improves hopefully in the next few days.  CT scan of the head done yesterday and showed improvement in cytotoxic edema with reduced 5 mm right to left shift. ? ?Vitals:  ? 09/17/21 0847 09/17/21 0900 09/17/21 0925 09/17/21 1000  ?BP: (!) 176/71 (!) 155/63 (!) 164/63 (!) 157/78  ?Pulse:  82  87  ?Resp:  (!) 21  (!) 26  ?Temp:  99.3 ?F (37.4 ?C)  99.5 ?F (37.5 ?C)  ?TempSrc:      ?SpO2:  99%  98%  ?Weight:      ?Height:      ? ?CBC:  ?Recent Labs  ?Lab 09/12/21 ?0300 09/12/21 ?0305 09/16/21 ?0421 09/17/21 ?0408  ?WBC 5.5   < > 17.9* 14.7*  ?NEUTROABS 2.1  --   --   --   ?HGB 14.3   < > 13.5 12.2*  ?HCT 40.0   < > 38.2* 34.6*  ?MCV 83.0   < > 85.7 86.9  ?PLT 174   < > 247 176  ? < > = values in this interval not displayed.  ? ? ?Basic Metabolic Panel:  ?Recent Labs  ?Lab 09/16/21 ?0421 09/16/21 ?0831 09/16/21 ?1550 09/16/21 ?2357 09/17/21 ?0408  ?NA 145   < > 151* 150* 150*  ?K 3.5  --   --   --  4.2  ?CL 118*  --   --   --  125*  ?CO2 20*  --   --   --  21*  ?GLUCOSE 282*  --   --   --  342*  ?BUN 22*  --   --   --  21*  ?CREATININE 1.10  --   --   --  1.00  ?CALCIUM 8.5*  --   --   --  8.1*  ?MG  --   --  2.3  --  2.3  ?PHOS  --   --  1.3*  --  2.5  ? < > = values in this interval not displayed.  ? ? ?Lipid Panel:  ?Recent Labs  ?Lab 09/12/21 ?0356 09/15/21 ?0559  ?CHOL 191  --   ?TRIG 284* 106  ?HDL 38*  --   ?CHOLHDL 5.0  --   ?VLDL 57*  --    ?[place] 96  --   ? ? ?HgbA1c:  ?Recent Labs  ?Lab 09/12/21 ?0300  ?HGBA1C 8.1*  ? ? ?Urine Drug Screen:  ?Recent Labs  ?Lab 09/12/21 ?1437  ?LABOPIA NONE DETECTED  ?COCAINSCRNUR NONE DETECTED  ?LABBENZ NONE DETECTED  ?AMPHETMU NONE DETECTED  ?THCU NONE DETECTED  ?LABBARB NONE DETECTED  ? ?  ?Alcohol Level No results for input(s): ETH in the last 168 hours. ? ?IMAGING past 24 hours ?CT HEAD WO CONTRAST (5MM) ? ?Result Date: 09/16/2021 ?CLINICAL DATA:  Stroke, follow up EXAM: CT HEAD WITHOUT CONTRAST TECHNIQUE: Contiguous axial images were obtained from the  base of the skull through the vertex without intravenous contrast. RADIATION DOSE REDUCTION: This exam was performed according to the departmental dose-optimization program which includes automated exposure control, adjustment of the mA and/or kV according to patient size and/or use of iterative reconstruction technique. COMPARISON:  None. FINDINGS: Brain: Status post right frontal craniotomy. No substantial change in large right MCA territory infarct with extensive associated edema and areas of petechial hemorrhage. No mass occupying acute hemorrhage. Improved mass effect and 5 mm of leftward midline shift (previously 12 mm) at the foramen of Monro. Subjacent to the craniotomy there is suspected extra-axial hemorrhage posteriorly, measuring up to 11 mm in thickness posteriorly. A surgical drain is in place. New area of hypoattenuation in the left middle cerebellar peduncle and cerebellum (for example, see series 3, images 10/11. Streak artifact limits assessment in this region. Improved rounding of the left temporal horn, compatible with improved ventricular entrapment. Vascular: Hyperdense right M2 MCA vessels, compatible with known occlusion. Skull: Right craniotomy Sinuses/Orbits: Mild paranasal sinus mucosal thickening no acute orbital findings. Other: No mastoid effusions. IMPRESSION: 1. New area of hypoattenuation in the left middle cerebellar peduncle and  cerebellum. This finding raises concern for new/interval acute infarct, although streak artifact limits assessment in this region. An MRI could further evaluate if clinically warranted. 2. No substantial change in appearance of an evolving large right MCA territory infarct with petechial hemorrhage. Interval right craniotomy with improved mass effect and 5 mm of leftward midline shift (previously 12 mm) at the foramen of Monro. Subjacent to the craniotomy there is suspected extra-axial hemorrhage posteriorly, measuring up to 11 mm in thickness posteriorly. A surgical drain is in place. 3. Improved rounding of the left temporal horn, compatible with improved ventricular entrapment. 4. Hyperdense right M2 MCA vessels, compatible with known occlusion Electronically Signed   By: Margaretha Sheffield M.D.   On: 09/16/2021 15:27  ? ?DG Chest Port 1 View ? ?Result Date: 09/17/2021 ?CLINICAL DATA:  Respiratory failure/ETT EXAM: PORTABLE CHEST 1 VIEW COMPARISON:  09/15/2021 FINDINGS: Unchanged cardiomediastinal silhouette. Endotracheal tube tip overlies the upper trachea between the clavicles. Orogastric tube tip and side port overlie the stomach. Right upper extremity PICC tip overlies the right atrium. Unchanged faint opacities in the left lung base. No new airspace disease. No pneumothorax. No large pleural effusion. IMPRESSION: Unchanged faint opacities in the left lung base. No new airspace disease. Endotracheal tube overlies the upper trachea at the level of the clavicles. Electronically Signed   By: Maurine Simmering M.D.   On: 09/17/2021 08:05   ? ?PHYSICAL EXAM ? ?Physical Exam  ?Constitutional: Appears well-developed and well-nourished middle-age is sedated and intubated African-American male, intubated hemi craniectomy surgical incision with clips seen on the right side ?Cardiovascular: Normal rate and regular rhythm.  ?Respiratory: Respirations synchronous with ventilator ? ?Neuro (intubated and on sedation with propofol):  Unresponsive eyes closed.  Does not follow commands.  PERRL, +corneal reflexes, oculocephalic reflex, cough and gag reflexes present, purposeful movement noted in right upper and lower extremities.  No movement noted in left extremities with noxious stimuli. ? ? ?ASSESSMENT/PLAN ?Jacob [name] is a 46 y.o. male with history of HTN, DM2 presenting with left side facial droop, left arm weakness. Received TNKase. Neuro status declined during MRI (NIHSS 13),taken for thrombectomy with Dr. Karenann Cai. TICI3 recanalization of the right M2/MCA achieved.  MRI/MRA shows reoccluded right M2 branch with a right MCA territory infarct.  Continue with PT/OT/SLP evals.  Home blood pressure medications  resumed. BP goal less than 160. Topamax and Tylenol #3 for headache management.  Head CT shows new 96mm leftward midline shift. Trasnferred back to ICU, decompressive hemicraniectomy performed by neurosurgery.  Follow-up CT shows 5 mm left midline shift.  Now intubated and on HTS at 50cc/hr. ? ?Stroke:  Right MCA due to right MCA occlusion  s/p TNK and mechanical thrombectomy with TICI3 revascularization etiology not certain, but most likely cryptogenic versus intracranial atherosclerosis..  Repeat CT scan 09/15/21 shows malignant cerebral edema with 12 mm right to left midline shift and trapping of the temporal horn s/p decompressive right hemicraniectomy 09/15/2021 ? ?Code Stroke CT head No acute abnormality ?CTA head & neck R short segment M1 occlusion, left distal M1 high grade stenosis ?Post IR CT No hemorrhagic complication on flat panel CT. ?4/21- Early MRI Tiny acute infarcts in the upper division right MCA cortex. ?4/22- MRI right MCA territory infarct involving the cortex, sparing the basal ganglia.  2 mm midline shift.  No hemorrhage ?4/22- MRA reoccluded right M2 branch, intracranial atherosclerosis including advanced left M1 stenosis ?4/24-  Head CT-increased edema resulting in near complete effacement of the  right lateral ventricle and 12 mm leftward midline shift ?4/25- Head CT Post OP- 5 mm left midline shift, new area of hypoattenuation of the left middle cerebellar peduncle and cerebellum ?2D Echo EF > 75%

## 2021-09-17 NOTE — Assessment & Plan Note (Signed)
Presently on maximal dose of IV Cleviprex ? ?- Lisinopril increased today.  ?- Follow renal function.  ?

## 2021-09-17 NOTE — Progress Notes (Signed)
? ?NAME:  Jacob [name], MRN:  [name], DOB:  02-18-1976, LOS: 5 ?ADMISSION DATE:  09/12/2021, CONSULTATION DATE:  09/15/2021 ?REFERRING MD:  Leonie Man - Stroke, CHIEF COMPLAINT:  Cerebral edema.   ? ?History of Present Illness:  ?46 year old man with worsening cerebral edema. ? ?He presented 3 days ago with left-sided weakness which was initially waxing and waning. CTA showed right M1 occlusion.  Further decline post TNK brought for thrombectomy at TICI 3 revascularization of right M2. ?Transfer to floor but experienced neurological decline 4/24.  CT showed malignant cerebral edema with 12 mm right to left midline shift and Trapping of the temporal horn. ? ?He was brought back to the ICU and started on 3% hypertonic saline and prepped for the operating room.  Platelets were transfused to offset effect of clopidogrel and aspirin. ? ?Taken to OR and underwent right frontotemporal parietal hemicraniectomy with placement of bone flap in abdominal subcutaneous tissue. Returned to ICU on vent. ? ?Pertinent  Medical History  ? ?Past Medical History:  ?Diagnosis Date  ? Allergy   ? Diabetes mellitus without complication ([place])   ? Hypertension   ? ? ? ?Significant Hospital Events: ?Including procedures, antibiotic start and stop dates in addition to other pertinent events   ?4/24 decompressive craniectomy. ?4/25 decreased midline shift on CT ? ?Interim History / Subjective:  ?Follows commands intermittently ? ?Objective   ?Blood pressure (!) 164/63, pulse 82, temperature 99.3 ?F (37.4 ?C), resp. rate (!) 21, height 6\' 1"  (1.854 m), weight 105.1 kg, SpO2 99 %. ?   ?Vent Mode: PRVC ?FiO2 (%):  [40 %] 40 % ?Set Rate:  [18 bmp] 18 bmp ?Vt Set:  [id number] mL] 630 mL ?PEEP:  [5 cmH20] 5 cmH20 ?Plateau Pressure:  [19 cmH20-21 cmH20] 21 cmH20  ? ?Intake/Output Summary (Last 24 hours) at 09/17/2021 0958 ?Last data filed at 09/17/2021 0900 ?Gross per 24 hour  ?Intake 4959.21 ml  ?Output 2580 ml  ?Net 2379.21 ml  ? ? ?Filed Weights  ? 09/12/21  0323 09/12/21 0611 09/17/21 0403  ?Weight: 107 kg 124 kg 105.1 kg  ? ? ?Examination: ?General: Young adult male, resting in bed, in NAD. ?Neuro: Sedated, no movement on L. Moves RUE purposefully but not to commands consistently.  ?HEENT: Right scalp dressings C//D/I. Sclerae anicteric. ETT in place. ?Cardiovascular: Soft holosystolic murmur. Extremities warm ?Lungs: Respirations even and unlabored.  CTA bilaterally,  tolerated PSV ?Abdomen: BS x 4, soft, NT/ND.  ?Musculoskeletal: No gross deformities, no edema.  ?Skin: Intact, warm, no rashes. ? ?Ancillary tests personally reviewed  ?CT scan from 4/24 shows large area of hypodensity in the right MCA territory with 1 cm midline shift. ?Sodium 150. ?Remains hyperglycemic.  ? ?Assessment & Plan:  ? ?Cerebral edema (HCC) ?Due to malignant MCA stroke.  Now status post decompressive craniectomy. Movement on right, but no consistently following commands.  ? ?-Continue hypertonic saline for further 24h.  ?-Optimize sedation to obtain best exam - Tube tolerance appears to be a major problem, will start fentanyl and try to wean off propofol or substitute Precedex for latter.  ? ?Acute right arterial ischemic stroke, MCA (middle cerebral artery) ([place]) ?Status post tPA and thrombectomy.  Improvement in dense left hemiplegia ? ?-Resume secondary stroke prevention postop ? ?Diabetes mellitus without complication ([place]) ?Suboptimal blood sugar control on sliding scale insulin. On no steroids.  ? ?-Maintain euglycemia to limit cerebral edema. ?-Increase basal insulin coverage further..  ? ?Acute respiratory failure with hypoxia ([place]) ?Presently on  mechanical ventilation for airway protection.  Chest x-ray is clear. Tolerating SBT but not managing oral secretions.  ? ?- Mental status will dictate timing of extubation. ?-Given severity of neurological symptoms will likely need tracheostomy. ?- Daily SBT ? ?Essential hypertension ?Presently on maximal dose of IV Cleviprex ? ?-  Lisinopril increased today.  ?- Follow renal function.  ? ?Best Practice (right click and "Reselect all SmartList Selections" daily)  ? ?Diet/type: tubefeeds ?DVT prophylaxis: prophylactic heparin  ?GI prophylaxis: PPI ?Lines: yes and it is still needed ?Foley:  Yes, and it is no longer needed ?Code Status:  full code ?Last date of multidisciplinary goals of care discussion [Wife updated at bedside 4/26] ? ?CRITICAL CARE ?Performed by: Kipp Brood ? ? ?Total critical care time: 40 minutes ? ?Critical care time was exclusive of separately billable procedures and treating other patients. ? ?Critical care was necessary to treat or prevent imminent or life-threatening deterioration. ? ?Critical care was time spent personally by me on the following activities: development of treatment plan with patient and/or surrogate as well as nursing, discussions with consultants, evaluation of patient's response to treatment, examination of patient, obtaining history from patient or surrogate, ordering and performing treatments and interventions, ordering and review of laboratory studies, ordering and review of radiographic studies, pulse oximetry, re-evaluation of patient's condition and participation in multidisciplinary rounds. ? ?Kipp Brood, MD FRCPC ?ICU Physician ?[place]  ?Pager: [phone] ?Mobile: [phone] ?After hours: [phone]. ? ?09/17/2021, 9:58 AM ? ? ? ? ? ?

## 2021-09-17 NOTE — Progress Notes (Signed)
eLink Physician-Brief Progress Note ?Patient Name: Jacob [name] ?DOB: 12-Feb-1976 ?MRN: [name] ? ? ?Date of Service ? 09/17/2021  ?HPI/Events of Note ? Serum Na+ increased to 155.  ?eICU Interventions ? 3 % Saline gtt reduced to 25 ml / hour/  ? ? ? ?  ? ?Frederik Pear ?09/17/2021, 9:39 PM ?

## 2021-09-18 ENCOUNTER — Inpatient Hospital Stay (HOSPITAL_COMMUNITY): Payer: BC Managed Care – PPO

## 2021-09-18 DIAGNOSIS — R609 Edema, unspecified: Secondary | ICD-10-CM

## 2021-09-18 DIAGNOSIS — I63511 Cerebral infarction due to unspecified occlusion or stenosis of right middle cerebral artery: Secondary | ICD-10-CM | POA: Diagnosis not present

## 2021-09-18 DIAGNOSIS — R509 Fever, unspecified: Secondary | ICD-10-CM

## 2021-09-18 DIAGNOSIS — R4182 Altered mental status, unspecified: Secondary | ICD-10-CM | POA: Diagnosis not present

## 2021-09-18 DIAGNOSIS — R339 Retention of urine, unspecified: Secondary | ICD-10-CM

## 2021-09-18 LAB — SODIUM
Sodium: 155 mmol/L — ABNORMAL HIGH (ref 135–145)
Sodium: 157 mmol/L — ABNORMAL HIGH (ref 135–145)
Sodium: 159 mmol/L — ABNORMAL HIGH (ref 135–145)
Sodium: 160 mmol/L — ABNORMAL HIGH (ref 135–145)

## 2021-09-18 LAB — BASIC METABOLIC PANEL
Anion gap: 3 — ABNORMAL LOW (ref 5–15)
BUN: 24 mg/dL — ABNORMAL HIGH (ref 6–20)
CO2: 23 mmol/L (ref 22–32)
Calcium: 8.1 mg/dL — ABNORMAL LOW (ref 8.9–10.3)
Chloride: 129 mmol/L — ABNORMAL HIGH (ref 98–111)
Creatinine, Ser: 1.16 mg/dL (ref 0.61–1.24)
GFR, Estimated: >60 mL/min (ref >60)
Glucose, Bld: 305 mg/dL — ABNORMAL HIGH (ref 70–99)
Potassium: 4 mmol/L (ref 3.5–5.1)
Sodium: 155 mmol/L — ABNORMAL HIGH (ref 135–145)

## 2021-09-18 LAB — TRIGLYCERIDES
Triglycerides: 345 mg/dL — ABNORMAL HIGH (ref <150)
Triglycerides: 212 mg/dL — ABNORMAL HIGH (ref <150)

## 2021-09-18 LAB — LIPASE, BLOOD: Lipase: 62 U/L — ABNORMAL HIGH (ref 11–51)

## 2021-09-18 LAB — CBC
HCT: 33.1 % — ABNORMAL LOW (ref 39.0–52.0)
Hemoglobin: 11.7 g/dL — ABNORMAL LOW (ref 13.0–17.0)
MCH: 31.5 pg (ref 26.0–34.0)
MCHC: 35.3 g/dL (ref 30.0–36.0)
MCV: 89 fL (ref 80.0–100.0)
Platelets: 162 10*3/uL (ref 150–400)
RBC: 3.72 MIL/uL — ABNORMAL LOW (ref 4.22–5.81)
RDW: 14.6 % (ref 11.5–15.5)
WBC: 13.1 10*3/uL — ABNORMAL HIGH (ref 4.0–10.5)
nRBC: 0 % (ref 0.0–0.2)

## 2021-09-18 LAB — GLUCOSE, CAPILLARY
Glucose-Capillary: 308 mg/dL — ABNORMAL HIGH (ref 70–99)
Glucose-Capillary: 250 mg/dL — ABNORMAL HIGH (ref 70–99)
Glucose-Capillary: 255 mg/dL — ABNORMAL HIGH (ref 70–99)
Glucose-Capillary: 319 mg/dL — ABNORMAL HIGH (ref 70–99)
Glucose-Capillary: 248 mg/dL — ABNORMAL HIGH (ref 70–99)
Glucose-Capillary: 198 mg/dL — ABNORMAL HIGH (ref 70–99)

## 2021-09-18 LAB — AMYLASE: Amylase: 102 U/L — ABNORMAL HIGH (ref 28–100)

## 2021-09-18 MED ORDER — FENTANYL CITRATE PF 50 MCG/ML IJ SOSY
50.0000 ug | PREFILLED_SYRINGE | INTRAMUSCULAR | Status: DC | PRN
  Administered 2021-09-19 (×3): 100 ug via INTRAVENOUS
  Administered 2021-09-19: 50 ug via INTRAVENOUS
  Administered 2021-09-19 (×2): 100 ug via INTRAVENOUS
  Administered 2021-09-19: 50 ug via INTRAVENOUS
  Administered 2021-09-20 (×3): 100 ug via INTRAVENOUS

## 2021-09-18 MED ORDER — LABETALOL HCL 5 MG/ML IV SOLN
0.5000 mg/min | Status: DC
  Administered 2021-09-18 (×2): 0.5 mg/min via INTRAVENOUS
  Filled 2021-09-18 (×2): qty 80

## 2021-09-18 MED ORDER — ENOXAPARIN SODIUM 40 MG/0.4ML IJ SOSY
40.0000 mg | PREFILLED_SYRINGE | INTRAMUSCULAR | Status: DC
  Administered 2021-09-18 – 2021-10-04 (×16): 40 mg via SUBCUTANEOUS
  Filled 2021-09-18 (×17): qty 0.4

## 2021-09-18 MED ORDER — CARVEDILOL 12.5 MG PO TABS
12.5000 mg | ORAL_TABLET | Freq: Two times a day (BID) | ORAL | Status: DC
  Administered 2021-09-18 – 2021-09-21 (×7): 12.5 mg
  Filled 2021-09-18 (×7): qty 1

## 2021-09-18 MED ORDER — CLONIDINE HCL 0.2 MG PO TABS
0.2000 mg | ORAL_TABLET | Freq: Three times a day (TID) | ORAL | Status: DC
  Administered 2021-09-18 – 2021-09-24 (×17): 0.2 mg
  Filled 2021-09-18 (×18): qty 1

## 2021-09-18 MED ORDER — DOXAZOSIN MESYLATE 2 MG PO TABS
2.0000 mg | ORAL_TABLET | Freq: Every day | ORAL | Status: DC
  Administered 2021-09-18 – 2021-09-19 (×2): 2 mg
  Filled 2021-09-18 (×3): qty 1

## 2021-09-18 MED ORDER — INSULIN ASPART 100 UNIT/ML IJ SOLN
10.0000 [IU] | INTRAMUSCULAR | Status: DC
  Administered 2021-09-18 – 2021-09-22 (×24): 10 [IU] via SUBCUTANEOUS

## 2021-09-18 MED ORDER — FENTANYL CITRATE PF 50 MCG/ML IJ SOSY
50.0000 ug | PREFILLED_SYRINGE | INTRAMUSCULAR | Status: DC | PRN
  Administered 2021-09-18 – 2021-09-21 (×2): 50 ug via INTRAVENOUS
  Filled 2021-09-18 (×2): qty 1

## 2021-09-18 NOTE — Progress Notes (Signed)
OT Cancellation Note ? ?Patient Details ?Name: Jacob [name] ?MRN: [id number] ?DOB: [birth year] ? ? ?Cancelled Treatment:    Reason Eval/Treat Not Completed: Medical issues which prohibited therapy.  Remains intubated and sedated.   ? ?[name] ?09/18/2021, 10:47 AM ?

## 2021-09-18 NOTE — Progress Notes (Signed)
Inpatient Rehab Admissions Coordinator:  ?Pt remains on the ventilator. Pt has been unable to participate in therapy d/t change in condition. Will continue to follow medical workup and participation and progress with therapies from a distance. ? ? ?Gayland Curry, MS, CCC-SLP ?Admissions Coordinator ?[phone] ? ?

## 2021-09-18 NOTE — Progress Notes (Signed)
Subjective: ?The patient is intubated and sedated on fentanyl.  He is in no apparent distress.  His wife is at the bedside. ? ?Objective: ?Vital signs in last 24 hours: ?Temp:  [98.1 ?F (36.7 ?C)-101.5 ?F (38.6 ?C)] 98.6 ?F (37 ?C) (04/27 0700) ?Pulse Rate:  [74-107] 88 (04/27 0755) ?Resp:  [17-31] 18 (04/27 0755) ?BP: (125-176)/(60-87) 135/75 (04/27 0700) ?SpO2:  [95 %-100 %] 96 % (04/27 0755) ?Arterial Line BP: (143-183)/(54-69) 158/61 (04/27 0700) ?FiO2 (%):  [40 %] 40 % (04/27 0755) ?Estimated body mass index is 30.57 kg/m? as calculated from the following: ?  Height as of this encounter: 6\' 1"  (1.854 m). ?  Weight as of this encounter: 105.1 kg. ? ? ?Intake/Output from previous day: ?04/26 0701 - 04/27 0700 ?In: 3946.4 [I.V.:2726.3; NG/GT:1020; IV Piggyback:200.1] ?Out: 1250 [Urine:1250] ?Intake/Output this shift: ?No intake/output data recorded. ? ?Physical exam the patient is intubated and sedated.  He moves his right upper extremity.  His left pupil is small.  I cannot see his right pupil.  His wound is healing well. ? ?Lab Results: ?Recent Labs  ?  09/17/21 ?0408 09/18/21 ?[id number]  ?WBC 14.7* 13.1*  ?HGB 12.2* 11.7*  ?HCT 34.6* 33.1*  ?PLT 176 162  ? ?BMET ?Recent Labs  ?  09/17/21 ?0408 09/17/21 ?1230 09/17/21 ?2351 09/18/21 ?[id number]  ?NA 150*   < > 155* 155*  ?K 4.2  --   --  4.0  ?CL 125*  --   --  129*  ?CO2 21*  --   --  23  ?GLUCOSE 342*  --   --  305*  ?BUN 21*  --   --  24*  ?CREATININE 1.00  --   --  1.16  ?CALCIUM 8.1*  --   --  8.1*  ? < > = values in this interval not displayed.  ? ? ?Studies/Results: ?CT HEAD WO CONTRAST (5MM) ? ?Result Date: 09/16/2021 ?CLINICAL DATA:  Stroke, follow up EXAM: CT HEAD WITHOUT CONTRAST TECHNIQUE: Contiguous axial images were obtained from the base of the skull through the vertex without intravenous contrast. RADIATION DOSE REDUCTION: This exam was performed according to the departmental dose-optimization program which includes automated exposure control, adjustment of  the mA and/or kV according to patient size and/or use of iterative reconstruction technique. COMPARISON:  None. FINDINGS: Brain: Status post right frontal craniotomy. No substantial change in large right MCA territory infarct with extensive associated edema and areas of petechial hemorrhage. No mass occupying acute hemorrhage. Improved mass effect and 5 mm of leftward midline shift (previously 12 mm) at the foramen of Monro. Subjacent to the craniotomy there is suspected extra-axial hemorrhage posteriorly, measuring up to 11 mm in thickness posteriorly. A surgical drain is in place. New area of hypoattenuation in the left middle cerebellar peduncle and cerebellum (for example, see series 3, images 10/11. Streak artifact limits assessment in this region. Improved rounding of the left temporal horn, compatible with improved ventricular entrapment. Vascular: Hyperdense right M2 MCA vessels, compatible with known occlusion. Skull: Right craniotomy Sinuses/Orbits: Mild paranasal sinus mucosal thickening no acute orbital findings. Other: No mastoid effusions. IMPRESSION: 1. New area of hypoattenuation in the left middle cerebellar peduncle and cerebellum. This finding raises concern for new/interval acute infarct, although streak artifact limits assessment in this region. An MRI could further evaluate if clinically warranted. 2. No substantial change in appearance of an evolving large right MCA territory infarct with petechial hemorrhage. Interval right craniotomy with improved mass effect and 5 mm of  leftward midline shift (previously 12 mm) at the foramen of Monro. Subjacent to the craniotomy there is suspected extra-axial hemorrhage posteriorly, measuring up to 11 mm in thickness posteriorly. A surgical drain is in place. 3. Improved rounding of the left temporal horn, compatible with improved ventricular entrapment. 4. Hyperdense right M2 MCA vessels, compatible with known occlusion Electronically Signed   By:  Margaretha Sheffield M.D.   On: 09/16/2021 15:27  ? ?DG Chest Port 1 View ? ?Result Date: 09/17/2021 ?CLINICAL DATA:  Respiratory failure/ETT EXAM: PORTABLE CHEST 1 VIEW COMPARISON:  09/15/2021 FINDINGS: Unchanged cardiomediastinal silhouette. Endotracheal tube tip overlies the upper trachea between the clavicles. Orogastric tube tip and side port overlie the stomach. Right upper extremity PICC tip overlies the right atrium. Unchanged faint opacities in the left lung base. No new airspace disease. No pneumothorax. No large pleural effusion. IMPRESSION: Unchanged faint opacities in the left lung base. No new airspace disease. Endotracheal tube overlies the upper trachea at the level of the clavicles. Electronically Signed   By: Maurine Simmering M.D.   On: 09/17/2021 08:05  ? ?DG Abd Portable 1V ? ?Result Date: 09/17/2021 ?CLINICAL DATA:  Feeding tube placement. EXAM: PORTABLE ABDOMEN - 1 VIEW COMPARISON:  Abdominal CT 10/23/2013.  Head CT 09/16/2021. FINDINGS: 1243 hours. Tip of the feeding tube projects over the left upper quadrant of the abdomen consistent with position in the mid stomach. The bowel gas pattern is nonobstructive. Postsurgical changes are present in the right mid abdomen related to recent right craniotomy and abdominal bone flap placement. IMPRESSION: Feeding tube projects to the level of the mid stomach. Electronically Signed   By: Richardean Sale M.D.   On: 09/17/2021 13:03  ? ?[place] EKG SITE RITE ? ?Result Date: 09/16/2021 ?If [employer] not attached, placement could not be confirmed due to current cardiac rhythm.  ? ?Assessment/Plan: ?Postop day #3: Continue supportive care.  Stroke is being managed by neurology.  I have answered all his wife's questions. ? LOS: 6 days  ? ? ? ?[name] ?09/18/2021, 8:08 AM ? ? ? ? ?Patient ID: Jacob [name], male   DOB: Feb 02, 1976, 46 y.o.   MRN: [name] ? ?

## 2021-09-18 NOTE — Progress Notes (Signed)
Maxed out on Cleviprex and requiring several PRN Labetalols. Will start Labetalol gtt to keep SBP < 160 and discontinue PRN Labetalol. His Heart rate is staying between 70s-80s. Will hold Labetalol if HR < 60. ? ?Donnetta Simpers ?[facility] ?Pager Number [phone] ?

## 2021-09-18 NOTE — Progress Notes (Addendum)
? ?NAME:  Jacob [name], MRN:  [id number], DOB:  02/23/1976, LOS: 6 ?ADMISSION DATE:  09/12/2021, CONSULTATION DATE:  09/15/2021 ?REFERRING MD:  Leonie Man - Stroke, CHIEF COMPLAINT:  Cerebral edema.   ? ?History of Present Illness:  ?46 year old man with worsening cerebral edema. ? ?He presented 4/21 with left-sided weakness which was initially waxing and waning. CTA showed right M1 occlusion.  Further decline post TNK brought for thrombectomy at TICI 3 revascularization of right M2. ?Transfer to floor but experienced neurological decline 4/24.  CT showed malignant cerebral edema with 12 mm right to left midline shift and Trapping of the temporal horn. ? ?He was brought back to the ICU and started on 3% hypertonic saline and prepped for the operating room.  Platelets were transfused to offset effect of clopidogrel and aspirin. ? ?Taken to OR and underwent right frontotemporal parietal hemicraniectomy with placement of bone flap in abdominal subcutaneous tissue. Returned to ICU on vent. ? ?Pertinent  Medical History  ? ?Past Medical History:  ?Diagnosis Date  ? Allergy   ? Diabetes mellitus without complication ([place])   ? Hypertension   ? ? ? ?Significant Hospital Events: ?Including procedures, antibiotic start and stop dates in addition to other pertinent events   ?4/24 decompressive craniectomy. ?4/25 decreased midline shift on CT ?4/26 still on HT saline.  Switched from prop to precedex. BP meds titrated up in effort to decrease cleviprex. Cortrak placed.  ?4/27 Cleviprex maxed out, started on labetolol gtt. Started scheduled clonidine, also started coreg. Foley replaced and started on doxazosin due to urinary retention. HT infusion cut in half w/ plan to work towards stopping. Stopped fent gtt. Changed to PRN w/ plan to start precedex if needed.  Checking [place] of UE to eval fever. Also sending PCT  ? ?Interim History / Subjective:  ?sedated ? ?Objective   ?Blood pressure (Abnormal) 143/61, pulse 88, temperature 98.6 ?F  (37 ?C), resp. rate 18, height 6\' 1"  (1.854 m), weight 105.1 kg, SpO2 96 %. ?   ?Vent Mode: CPAP;PSV ?FiO2 (%):  [40 %] 40 % ?Set Rate:  [18 bmp] 18 bmp ?Vt Set:  [id number] mL] 630 mL ?PEEP:  [5 cmH20] 5 cmH20 ?Pressure Support:  [8 [id number] cmH20] 12 cmH20 ?Plateau Pressure:  [16 cmH20-23 cmH20] 16 cmH20  ? ?Intake/Output Summary (Last 24 hours) at 09/18/2021 1009 ?Last data filed at 09/18/2021 [id number] ?Gross per 24 hour  ?Intake 3257.98 ml  ?Output 950 ml  ?Net 2307.98 ml  ? ?Filed Weights  ? 09/12/21 0323 09/12/21 0611 09/17/21 0403  ?Weight: 107 kg 124 kg 105.1 kg  ? ? ?Examination: ?General 46 year old male sedated on vent  ?HENT right cran incision CD&I some facial swelling and right eye still swollen shut. Orally intubated ?Pulm clear currently on PSV w/ excellent volumes and no sig WOB ?Card RRR w/ holisystolic HM ?Abd soft ?Ext warm. No sig swelling LEs does have Bilateral UE swelling. RUE PICC site unremarkable.  ?GU cl yellow ?Neuro w/d to pain on right. More sedated today staff just stopped fent gtt.  ? ? ? ?Assessment & Plan:  ?Principal Problem: ?  Acute right arterial ischemic stroke, MCA (middle cerebral artery) ([place]) ?Active Problems: ?  Diabetes mellitus without complication ([place]) ?  Essential hypertension ?  Hyperlipidemia ?  Cerebral edema ([facility]) ?  Acute respiratory failure with hypoxia ([place]) ?  Urinary retention ?  Fever ? ? ?Acute right MCA stroke (s/p tPA and mechanical thrombectomy) complicated by Cerebral edema  requiring decompressive craniectomy on 4/24 ?Plan ?Cont HT saline protocol w/ goal Na 150-155 (cut in 1/2 last evening) ?Cont precedex and PRN fent (RASS goal 0 to -1) ?Cont statin  ?Holding antiplatelet agents until cleared by surg  ?BP goal < 160  ?Will need extensive PT/OT ?Stroke team to recheck CT am  ? ?Poorly controlled HTN. Has been maxed out on Cleviprex. Added Labetolol last night.  ?Plan ?Cont celviprex and labetolol w/ goal SBP <160 ?Cont Norvasc 10mg /d and lisinopril added low dose  coreg ?Will schedule the Clonidine instead of having PRN; hopefully this will allow [place] to wean off gtts ?May need to add scheduled hydralazine as well  ? ?Respiratory failure  ?Remains of mechanical ventilation for airway protection ?Still has sig oral secretions and at this point feel like he will likely need trach ?Plan ?Cont full vent support w/ daily PSV trial  ?Daily assessment for extubation but mental status will dictate this.  ?VAP bundle  ? ?Fluid and electrolyte imbalance: therapeutic hypernatremia, secondary hyperchloremia ?Plan ?Cont HT saline protocol ?Now HT saline cut in 1/2 eventually stop and let normalize when OK w/ stroke team  ? ?DM w/ poorly controlled hyperglycemia ?Plan ?Cont SSI (resistant) ?Cont Levemir 30 bid ?Increase aspart scheduled to 10 units.  ? ?Mild Leukocytosis spiked fever 4/26 ?Plan ?Ck PCT ?Ck bilateral UE [place]  ? ?Urinary retention  ?Plan ?Replace foley ?Start doxazosin  ? ?Best Practice (right click and "Reselect all SmartList Selections" daily)  ? ?Diet/type: tubefeeds ?DVT prophylaxis: SCD ?GI prophylaxis: PPI ?Lines: yes and it is still needed ?Foley:  Yes, and it is still needed ?Code Status:  full code ?Last date of multidisciplinary goals of care discussion [Wife updated at bedside 4/26] ? ?CRITICAL CARE ?Performed by: Clementeen Graham ? ? My cct 34 min  ? ?Erick Colace ACNP-BC ?[place] ?Pager # [phone] OR # [phone] if no answer ? ?

## 2021-09-18 NOTE — Progress Notes (Signed)
STROKE TEAM PROGRESS NOTE  ? ?INTERVAL HISTORY ?Patient is seen in his room with his wife at the bedside.   Marland Kitchen  He remains on 3% HTS with serum sodium being 155 and is intubated but is being weaned off ventilatory support.  He however remains unresponsive but is on sedation.Marland Kitchen  He demonstrates purposeful movement of right side.  PCCM switching his sedation to as needed only increase p.o.   ?Vitals:  ? 09/18/21 1000 09/18/21 1100 09/18/21 1127 09/18/21 1200  ?BP: 124/74 122/72  126/76  ?Pulse: 88 88 87 87  ?Resp: (!) 22 (!) 21 (!) 22 (!) 22  ?Temp: (!) 100.8 ?F (38.2 ?C) (!) 101.7 ?F (38.7 ?C)  (!) 100.8 ?F (38.2 ?C)  ?TempSrc:      ?SpO2: 96% 97% 96% 97%  ?Weight:      ?Height:      ? ?CBC:  ?Recent Labs  ?Lab 09/12/21 ?0300 09/12/21 ?0305 09/17/21 ?0408 09/18/21 ?7741  ?WBC 5.5   < > 14.7* 13.1*  ?NEUTROABS 2.1  --   --   --   ?HGB 14.3   < > 12.2* 11.7*  ?HCT 40.0   < > 34.6* 33.1*  ?MCV 83.0   < > 86.9 89.0  ?PLT 174   < > 176 162  ? < > = values in this interval not displayed.  ? ?Basic Metabolic Panel:  ?Recent Labs  ?Lab 09/17/21 ?0408 09/17/21 ?1230 09/17/21 ?1758 09/17/21 ?2351 09/18/21 ?2878  ?NA 150*   < > 155* 155* 155*  ?K 4.2  --   --   --  4.0  ?CL 125*  --   --   --  129*  ?CO2 21*  --   --   --  23  ?GLUCOSE 342*  --   --   --  305*  ?BUN 21*  --   --   --  24*  ?CREATININE 1.00  --   --   --  1.16  ?CALCIUM 8.1*  --   --   --  8.1*  ?MG 2.3  --  2.5*  --   --   ?PHOS 2.5  --  2.5  --   --   ? < > = values in this interval not displayed.  ? ?Lipid Panel:  ?Recent Labs  ?Lab 09/12/21 ?0356 09/15/21 ?0559 09/18/21 ?6767  ?CHOL 191  --   --   ?TRIG 284*   < > 345*  ?HDL 38*  --   --   ?CHOLHDL 5.0  --   --   ?VLDL 57*  --   --   ?LDLCALC 96  --   --   ? < > = values in this interval not displayed.  ? ?HgbA1c:  ?Recent Labs  ?Lab 09/12/21 ?0300  ?HGBA1C 8.1*  ? ?Urine Drug Screen:  ?Recent Labs  ?Lab 09/12/21 ?1437  ?LABOPIA NONE DETECTED  ?COCAINSCRNUR NONE DETECTED  ?LABBENZ NONE DETECTED  ?AMPHETMU NONE  DETECTED  ?THCU NONE DETECTED  ?LABBARB NONE DETECTED  ?  ?Alcohol Level No results for input(s): ETH in the last 168 hours. ? ?IMAGING past 24 hours ?VAS [place] UPPER EXTREMITY VENOUS DUPLEX ? ?Result Date: 09/18/2021 ?UPPER VENOUS STUDY  Patient Name:  Jacob [name]  Date of Exam:   09/18/2021 Medical Rec #: [id number]       Accession #:    [id number] Date of Birth: August 29, 1975       Patient Gender: M Patient Age:  46 years Exam Location:  [facility] Procedure:      VAS [place] UPPER EXTREMITY VENOUS DUPLEX Referring Phys: Collier Salina BABCOCK --------------------------------------------------------------------------------  Indications: Edema Limitations: Poor ultrasound/tissue interface, body habitus and position, ventilated. Comparison Study: No prior study Performing Technologist: Maudry Mayhew MHA, RDMS, RVT, RDCS  Examination Guidelines: A complete evaluation includes B-mode imaging, spectral Doppler, color Doppler, and power Doppler as needed of all accessible portions of each vessel. Bilateral testing is considered an integral part of a complete examination. Limited examinations for reoccurring indications may be performed as noted.  Right Findings: +----------+------------+---------+-----------+----------+--------------+ RIGHT     CompressiblePhasicitySpontaneousProperties   Summary     +----------+------------+---------+-----------+----------+--------------+ IJV           Full       Yes       Yes                             +----------+------------+---------+-----------+----------+--------------+ Subclavian    Full       Yes       Yes                             +----------+------------+---------+-----------+----------+--------------+ Axillary      Full       Yes       Yes                             +----------+------------+---------+-----------+----------+--------------+ Brachial      Full       Yes       Yes                              +----------+------------+---------+-----------+----------+--------------+ Radial        Full                                                 +----------+------------+---------+-----------+----------+--------------+ Ulnar         Full                                                 +----------+------------+---------+-----------+----------+--------------+ Cephalic      Full                                                 +----------+------------+---------+-----------+----------+--------------+ Basilic                                             Not visualized +----------+------------+---------+-----------+----------+--------------+  Left Findings: +----------+------------+---------+-----------+----------+-----------------+ LEFT      CompressiblePhasicitySpontaneousProperties     Summary      +----------+------------+---------+-----------+----------+-----------------+ IJV           Full       Yes       Yes                                +----------+------------+---------+-----------+----------+-----------------+  Subclavian    Full       Yes       Yes                                +----------+------------+---------+-----------+----------+-----------------+ Axillary      Full       Yes       Yes                                +----------+------------+---------+-----------+----------+-----------------+ Brachial      Full       Yes       Yes                                +----------+------------+---------+-----------+----------+-----------------+ Radial        Full                                                    +----------+------------+---------+-----------+----------+-----------------+ Ulnar         Full                                                    +----------+------------+---------+-----------+----------+-----------------+ Cephalic      None                 No               Age Indeterminate  +----------+------------+---------+-----------+----------+-----------------+ Basilic       Full                                                    +----------+------------+---------+-----------+----------+-----------------+  Summary:  Right: No evidence of deep vein thrombosis in the upper extremity. No evidence of superficial vein thrombosis in the upper extremity.  Left: No evidence of deep vein thrombosis in the upper extremity. Findings consistent with age indeterminate superficial vein thrombosis involving the left cephalic vein.  *See table(s) above for measurements and observations.    Preliminary    ? ?PHYSICAL EXAM ? ?Physical Exam  ?Constitutional: Appears well-developed and well-nourished middle-age is sedated and intubated African-American male, intubated hemi craniectomy surgical incision with clips seen on the right side ?Cardiovascular: Normal rate and regular rhythm.  ?Respiratory: Respirations synchronous with ventilator ? ?Neuro (intubated and on sedation ): Unresponsive eyes closed.  Does not follow commands.  PERRL, +corneal reflexes, oculocephalic reflex, cough and gag reflexes present, purposeful movement noted in right upper and lower extremities.  No movement noted in left extremities with noxious stimuli. ? ? ?ASSESSMENT/PLAN ?Jacob [name] is a 46 y.o. male with history of HTN, DM2 presenting with left side facial droop, left arm weakness. Received TNKase. Neuro status declined during MRI (NIHSS 13),taken for thrombectomy with Dr. Karenann Cai. TICI3 recanalization of the right M2/MCA achieved.  MRI/MRA shows reoccluded right M2 branch with a right MCA territory infarct.  Continue with PT/OT/SLP  evals.  Home blood pressure medications resumed. BP goal less than 160. Topamax and Tylenol #3 for headache management.  Head CT shows new 37mm leftward midline shift. Trasnferred back to ICU, decompressive hemicraniectomy performed by neurosurgery.  Follow-up CT shows 5 mm left  midline shift.  Now intubated and on HTS at 50cc/hr. ? ?Stroke:  Right MCA due to right MCA occlusion  s/p TNK and mechanical thrombectomy with TICI3 revascularization etiology not certain, but most likely cryptogenic versus intracranial atherosclerosis..  Repeat CT scan 09/15/21 shows malignant cerebral edema with 12 mm right to left midline shift and trapping of the temporal horn s/p decompress

## 2021-09-18 NOTE — Progress Notes (Signed)
SLP Cancellation Note ? ?Patient Details ?Name: Jacob [name] ?MRN: [name] ?DOB: 07/15/1975 ? ? ?Cancelled treatment:       Reason Eval/Treat Not Completed: Medical issues which prohibited therapy (Pt remains on the vent. SLP will continue to follow, but will likely sign off 4/28 if status remains the same.) ? ?Jacob [name], [place], CCC-SLP ?[facility] ?Office number [phone] ?Pager [phone] ? ?[name] ?09/18/2021, 8:48 AM ?

## 2021-09-18 NOTE — Progress Notes (Signed)
eLink Physician-Brief Progress Note ?Patient Name: KALYX SPADE ?DOB: 05-15-1976 ?MRN: [id number] ? ? ?Date of Service ? 09/18/2021  ?HPI/Events of Note ? Serum sodium 160, hypertonic saline gtt has been discontinued.  ?eICU Interventions ? Trend serum sodium and consider intervention if the next sodium exceeds 160.  ? ? ? ?  ? ?Kerry Kass Milano Rosevear ?09/18/2021, 11:36 PM ?

## 2021-09-18 NOTE — Progress Notes (Signed)
Bilateral lower extremity venous duplex completed. ?Refer to "CV Proc" under chart review to view preliminary results. ? ?09/18/2021 10:58 AM ?Kelby Aline., MHA, RVT, RDCS, RDMS   ?

## 2021-09-18 NOTE — Progress Notes (Signed)
Pt has weaned well all day.  Placed patient back on full ventilator support for night rest. ?

## 2021-09-18 NOTE — Progress Notes (Signed)
PT Cancellation Note ? ?Patient Details ?Name: Jacob [name] ?MRN: [id number] ?DOB: 1975-06-21 ? ? ?Cancelled Treatment:    Reason Eval/Treat Not Completed: Medical issues which prohibited therapy. Pt remains intubated and sedated. PT will follow up tomorrow. ? ? ?Zenaida Niece ?09/18/2021, 10:02 AM ?

## 2021-09-19 ENCOUNTER — Inpatient Hospital Stay (HOSPITAL_COMMUNITY): Payer: BC Managed Care – PPO

## 2021-09-19 DIAGNOSIS — E870 Hyperosmolality and hypernatremia: Secondary | ICD-10-CM

## 2021-09-19 DIAGNOSIS — R4182 Altered mental status, unspecified: Secondary | ICD-10-CM | POA: Diagnosis not present

## 2021-09-19 DIAGNOSIS — I63511 Cerebral infarction due to unspecified occlusion or stenosis of right middle cerebral artery: Secondary | ICD-10-CM | POA: Diagnosis not present

## 2021-09-19 LAB — BASIC METABOLIC PANEL
BUN: 33 mg/dL — ABNORMAL HIGH (ref 6–20)
CO2: 25 mmol/L (ref 22–32)
Calcium: 8.6 mg/dL — ABNORMAL LOW (ref 8.9–10.3)
Chloride: >130 mmol/L (ref 98–111)
Creatinine, Ser: 1.53 mg/dL — ABNORMAL HIGH (ref 0.61–1.24)
GFR, Estimated: 56 mL/min — ABNORMAL LOW (ref >60)
Glucose, Bld: 234 mg/dL — ABNORMAL HIGH (ref 70–99)
Potassium: 3.5 mmol/L (ref 3.5–5.1)
Sodium: 160 mmol/L — ABNORMAL HIGH (ref 135–145)

## 2021-09-19 LAB — CBC
HCT: 29.5 % — ABNORMAL LOW (ref 39.0–52.0)
Hemoglobin: 9.8 g/dL — ABNORMAL LOW (ref 13.0–17.0)
MCH: 29.3 pg (ref 26.0–34.0)
MCHC: 33.2 g/dL (ref 30.0–36.0)
MCV: 88.3 fL (ref 80.0–100.0)
Platelets: 135 10*3/uL — ABNORMAL LOW (ref 150–400)
RBC: 3.34 MIL/uL — ABNORMAL LOW (ref 4.22–5.81)
RDW: 15 % (ref 11.5–15.5)
WBC: 10.7 10*3/uL — ABNORMAL HIGH (ref 4.0–10.5)
nRBC: 0 % (ref 0.0–0.2)

## 2021-09-19 LAB — GLUCOSE, CAPILLARY
Glucose-Capillary: 207 mg/dL — ABNORMAL HIGH (ref 70–99)
Glucose-Capillary: 210 mg/dL — ABNORMAL HIGH (ref 70–99)
Glucose-Capillary: 211 mg/dL — ABNORMAL HIGH (ref 70–99)
Glucose-Capillary: 214 mg/dL — ABNORMAL HIGH (ref 70–99)
Glucose-Capillary: 219 mg/dL — ABNORMAL HIGH (ref 70–99)
Glucose-Capillary: 225 mg/dL — ABNORMAL HIGH (ref 70–99)

## 2021-09-19 LAB — PROCALCITONIN: Procalcitonin: 0.5 ng/mL

## 2021-09-19 LAB — SODIUM: Sodium: 161 mmol/L (ref 135–145)

## 2021-09-19 IMAGING — CT CT HEAD W/O CM
3 series · 15 of 47 positions shown, 18 images · non-contrast
Comparison: Head CT [DATE].  Brain MRI and MRA [DATE].

CLINICAL DATA: 46-year-old male with large right MCA territory
infarct, reoccluded right M2 branch. Questionable new left
cerebellar infarct on head CT [DATE]. Subsequent encounter.



[Series 3: head 5.0 h30s · axial · 0.45mm/px · z∈[-140,+25]mm · 9 of 39 slices shown, 12 images]
[im 3/39  brain]
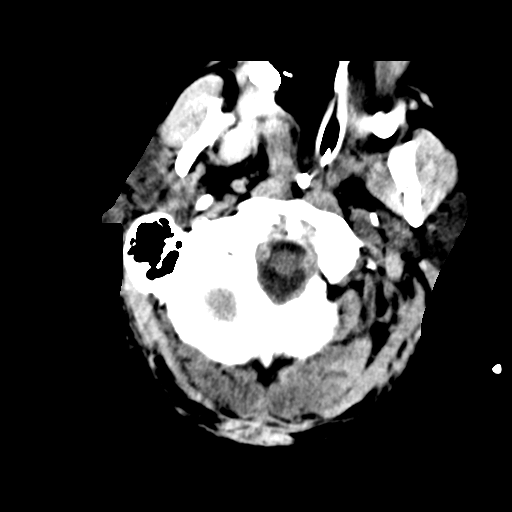
[im 3/39  bone]
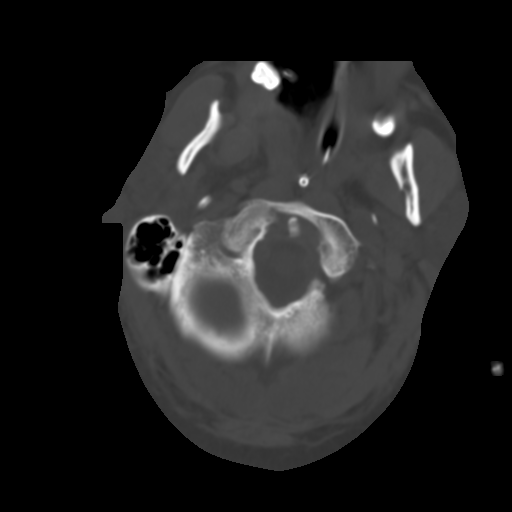
[im 7/39  brain]
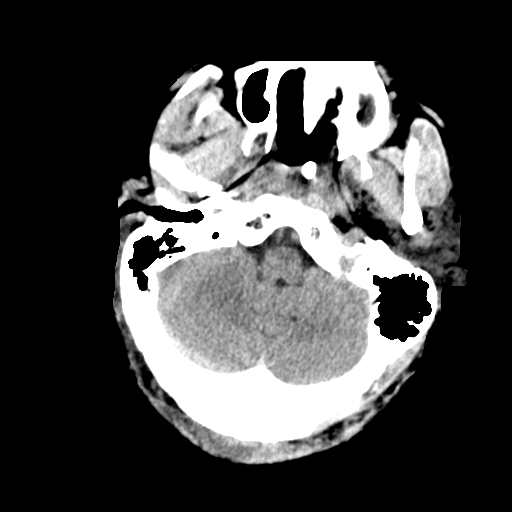
[im 11/39  brain]
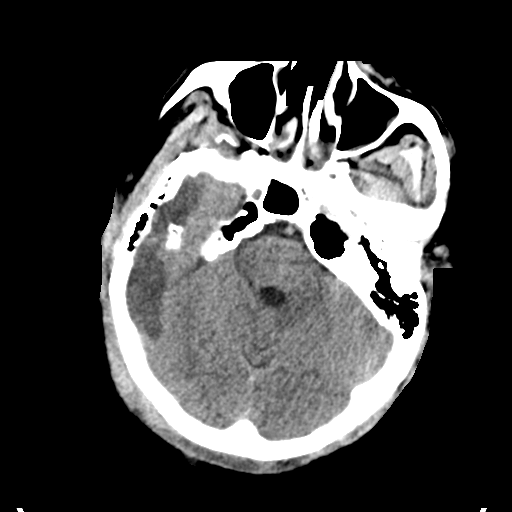
[im 15/39  brain]
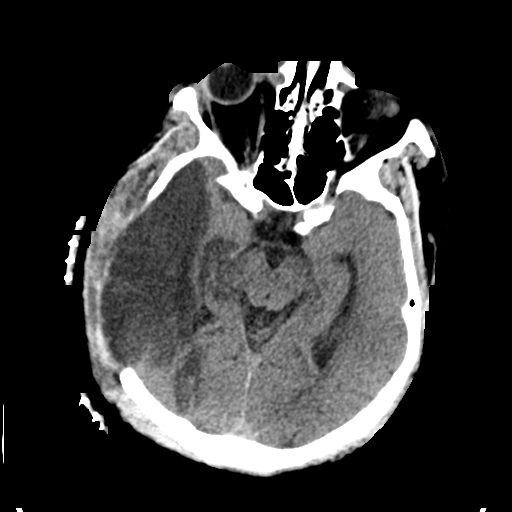
[im 20/39  brain]
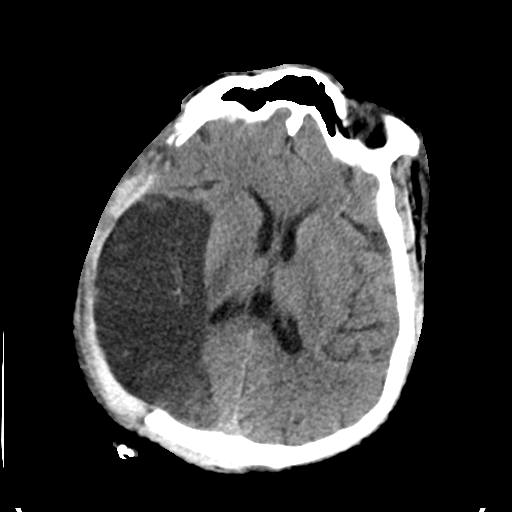
[im 20/39  bone]
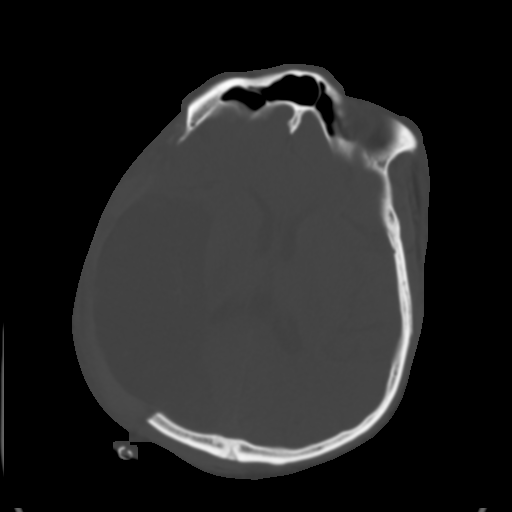
[im 24/39  brain]
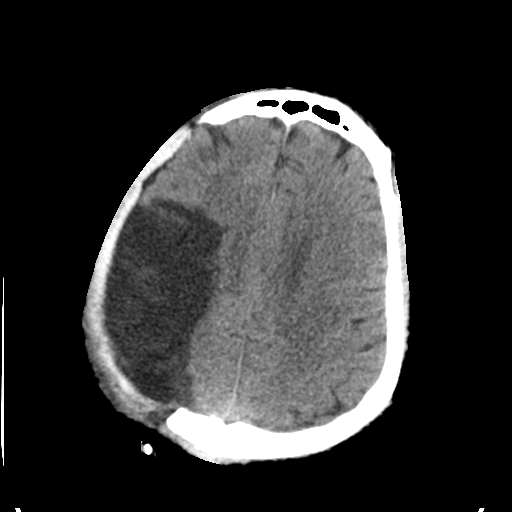
[im 28/39  brain]
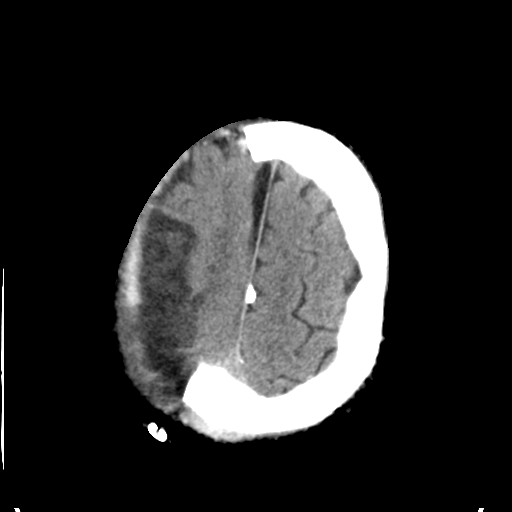
[im 32/39  brain]
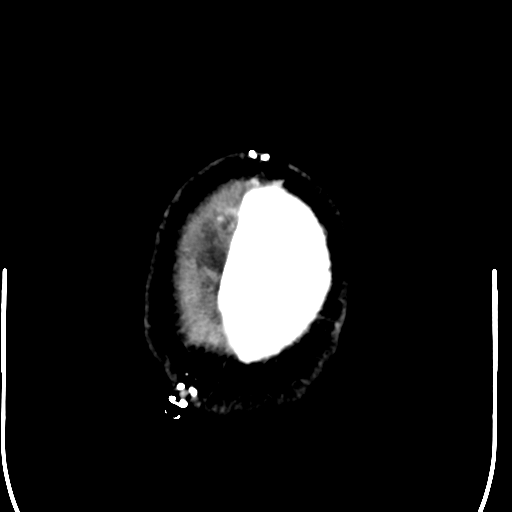
[im 36/39  brain]
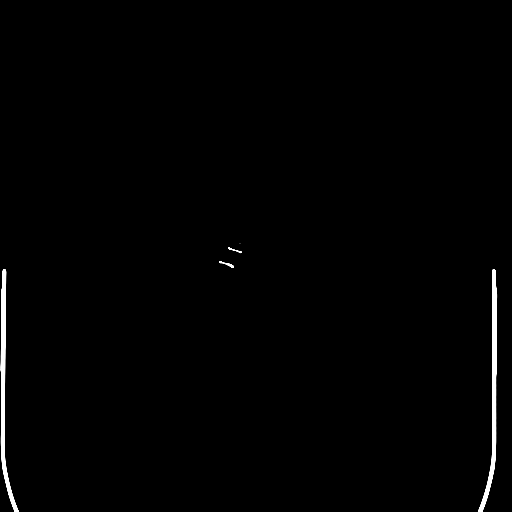
[im 36/39  bone]
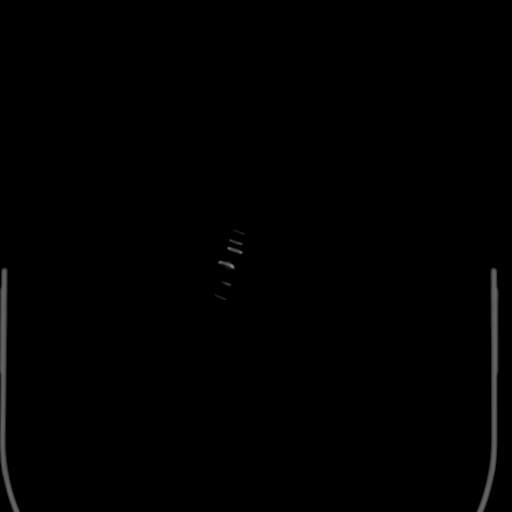

[Series 5: head 3.0 mpr cor · coronal · 0.37mm/px · 3 of 86 slices shown]
[im 29/86  brain]
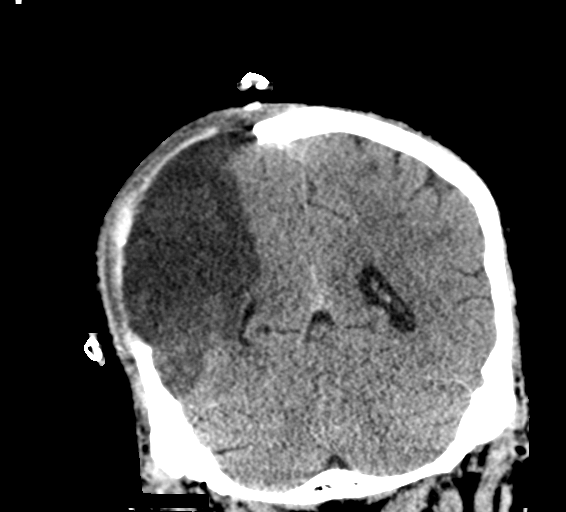
[im 38/86  brain]
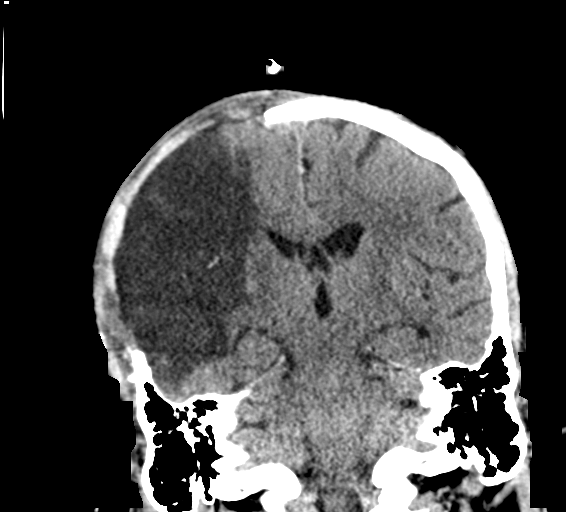
[im 48/86  brain]
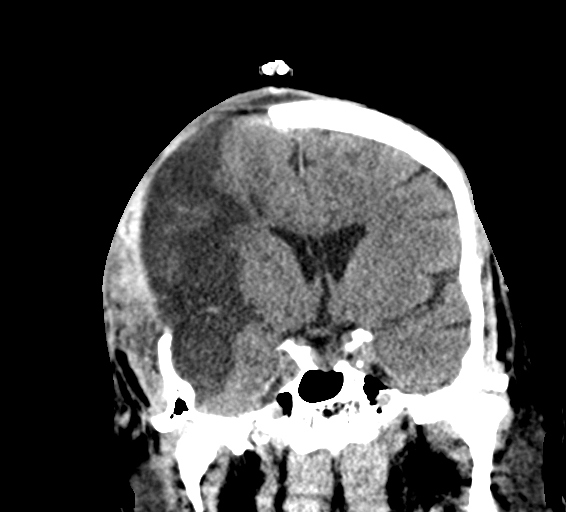

[Series 6: head 3.0 mpr sag · sagittal · 0.37mm/px · 3 of 65 slices shown]
[im 22/65  brain]
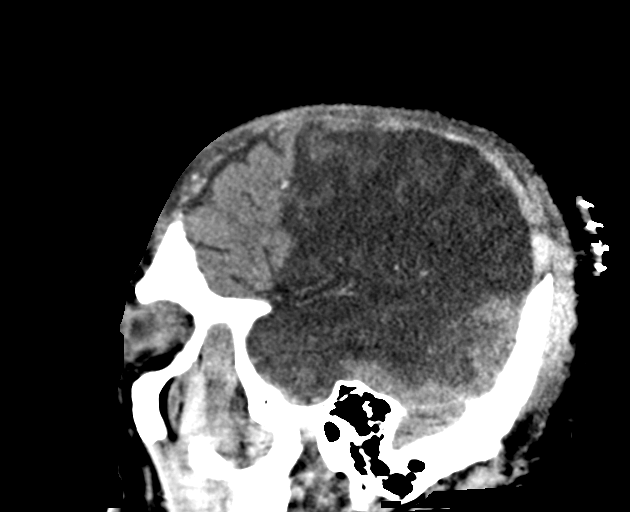
[im 33/65  brain]
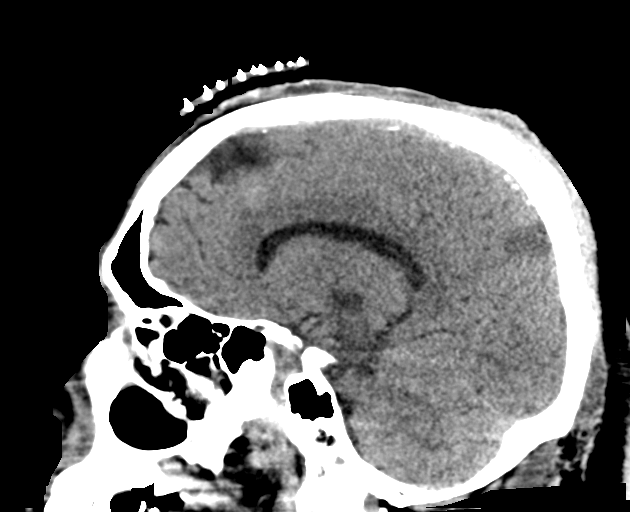
[im 43/65  brain]
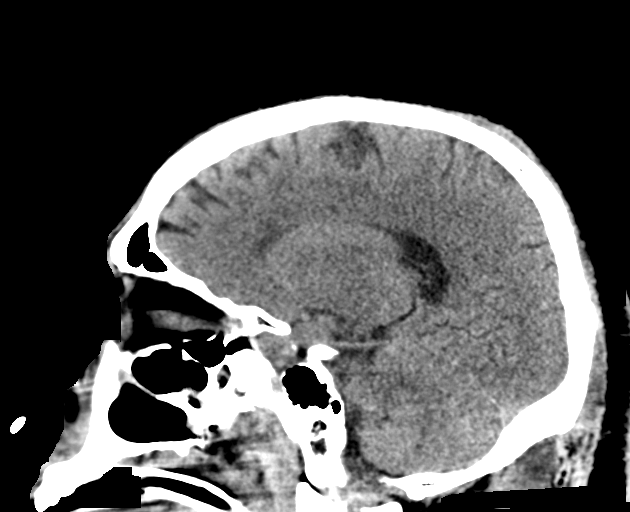

[15 of 47 positions shown; findings below may reference images not displayed]

FINDINGS: Brain: Large area of cytotoxic edema throughout the middle and
posterior right MCA territory with some right MCA/PCA watershed
involvement is stable since [DATE]. Right internal capsule
involvement. No malignant hemorrhagic transformation. Slightly less
effacement of the right lateral ventricle now. No significant
midline shift. Cavum septum pellucidum (normal variant). No
ventriculomegaly. Basilar cisterns remain patent.

Gray-white matter differentiation in the left hemisphere and
posterior fossa remains within normal limits. Artifactual
hypodensity in the left cerebellum now suspected on [DATE].

Vascular: Mild Calcified atherosclerosis at the skull base.

Skull: Right hemi craniectomy.  No new osseous abnormality.

Sinuses/Orbits: Mildly increased sinus mucosal thickening. New left
nasoenteric tube. Tympanic cavities and mastoids remain clear.

Other: Postoperative changes from right hemi craniectomy.
Postoperative drain removed. Skin staples still in place. Orbits
soft tissues appear stable, negative.
IMPRESSION: 1. Large Right MCA infarct with no malignant hemorrhagic
transformation. Cytotoxic edema related mass effect slightly
diminished since [DATE], with overlying hemi craniectomy.

2. Normal (artifact now suspected on [DATE]). No new
intracranial abnormality.

## 2021-09-19 MED ORDER — INSULIN DETEMIR 100 UNIT/ML ~~LOC~~ SOLN
40.0000 [IU] | Freq: Two times a day (BID) | SUBCUTANEOUS | Status: DC
  Administered 2021-09-19 – 2021-09-22 (×7): 40 [IU] via SUBCUTANEOUS
  Filled 2021-09-19 (×8): qty 0.4

## 2021-09-19 MED ORDER — LABETALOL HCL 5 MG/ML IV SOLN
10.0000 mg | INTRAVENOUS | Status: DC | PRN
  Administered 2021-09-19 – 2021-09-29 (×11): 10 mg via INTRAVENOUS
  Filled 2021-09-19 (×14): qty 4

## 2021-09-19 MED ORDER — BISACODYL 10 MG RE SUPP
10.0000 mg | Freq: Once | RECTAL | Status: AC
  Administered 2021-09-19: 10 mg via RECTAL
  Filled 2021-09-19: qty 1

## 2021-09-19 MED ORDER — FREE WATER
100.0000 mL | Status: DC
  Administered 2021-09-19 – 2021-09-20 (×6): 100 mL

## 2021-09-19 MED ORDER — SODIUM CHLORIDE 0.9 % IV SOLN: INTRAVENOUS | Status: DC | PRN

## 2021-09-19 NOTE — Progress Notes (Signed)
Subjective: ?The patient is in no apparent distress.  He is intubated.  His wife is at the bedside. ? ?Objective: ?Vital signs in last 24 hours: ?Temp:  [98.1 ?F (36.7 ?C)-101.7 ?F (38.7 ?C)] 99.3 ?F (37.4 ?C) (04/28 0600) ?Pulse Rate:  [70-91] 78 (04/28 0600) ?Resp:  [0-22] 18 (04/28 0600) ?BP: (110-150)/(52-82) 143/79 (04/28 0600) ?SpO2:  [94 %-100 %] 99 % (04/28 0600) ?Arterial Line BP: (111-158)/(51-68) 149/68 (04/28 0600) ?FiO2 (%):  [40 %] 40 % (04/28 0254) ?Estimated body mass index is 30.57 kg/m? as calculated from the following: ?  Height as of this encounter: 6\' 1"  (1.854 m). ?  Weight as of this encounter: 105.1 kg. ? ? ?Intake/Output from previous day: ?04/27 0701 - 04/28 0700 ?In: 2218.4 [I.V.:778.4; NG/GT:1440] ?Out: 890 [Urine:890] ?Intake/Output this shift: ?Total I/O ?In: 757.5 [I.V.:37.5; NG/GT:720] ?Out: 315 [Urine:315] ? ?Physical exam the patient is purposeful on the right.  His pupils are approximately 2 mm bilaterally.  He is left hemiplegic.  His wound is healing well. ? ?A head CT is pending. ? ?Lab Results: ?Recent Labs  ?  09/18/21 ?0607 09/19/21 ?0527  ?WBC 13.1* 10.7*  ?HGB 11.7* 9.8*  ?HCT 33.1* 29.5*  ?PLT 162 135*  ? ?BMET ?Recent Labs  ?  09/18/21 ?0607 09/18/21 ?1222 09/18/21 ?2143 09/19/21 ?0527  ?NA 155*   < > 160* 160*  ?K 4.0  --   --  3.5  ?CL 129*  --   --  >130*  ?CO2 23  --   --  25  ?GLUCOSE 305*  --   --  234*  ?BUN 24*  --   --  33*  ?CREATININE 1.16  --   --  1.53*  ?CALCIUM 8.1*  --   --  8.6*  ? < > = values in this interval not displayed.  ? ? ?Studies/Results: ?DG Abd Portable 1V ? ?Result Date: 09/17/2021 ?CLINICAL DATA:  Feeding tube placement. EXAM: PORTABLE ABDOMEN - 1 VIEW COMPARISON:  Abdominal CT 10/23/2013.  Head CT 09/16/2021. FINDINGS: 1243 hours. Tip of the feeding tube projects over the left upper quadrant of the abdomen consistent with position in the mid stomach. The bowel gas pattern is nonobstructive. Postsurgical changes are present in the right mid  abdomen related to recent right craniotomy and abdominal bone flap placement. IMPRESSION: Feeding tube projects to the level of the mid stomach. Electronically Signed   By: Richardean Sale M.D.   On: 09/17/2021 13:03  ? ?VAS [place] UPPER EXTREMITY VENOUS DUPLEX ? ?Result Date: 09/18/2021 ?UPPER VENOUS STUDY  Patient Name:  Jacob [name]  Date of Exam:   09/18/2021 Medical Rec #: [id number]       Accession #:    [id number] Date of Birth: Dec 30, 1975       Patient Gender: M Patient Age:   56 years Exam Location:  [facility] Procedure:      VAS [place] UPPER EXTREMITY VENOUS DUPLEX Referring Phys: Salvadore Dom --------------------------------------------------------------------------------  Indications: Edema Limitations: Poor ultrasound/tissue interface, body habitus and position, ventilated. Comparison Study: No prior study Performing Technologist: Maudry Mayhew MHA, RDMS, RVT, RDCS  Examination Guidelines: A complete evaluation includes B-mode imaging, spectral Doppler, color Doppler, and power Doppler as needed of all accessible portions of each vessel. Bilateral testing is considered an integral part of a complete examination. Limited examinations for reoccurring indications may be performed as noted.  Right Findings: +----------+------------+---------+-----------+----------+--------------+ RIGHT     CompressiblePhasicitySpontaneousProperties   Summary     +----------+------------+---------+-----------+----------+--------------+  IJV           Full       Yes       Yes                             +----------+------------+---------+-----------+----------+--------------+ Subclavian    Full       Yes       Yes                             +----------+------------+---------+-----------+----------+--------------+ Axillary      Full       Yes       Yes                             +----------+------------+---------+-----------+----------+--------------+ Brachial      Full       Yes        Yes                             +----------+------------+---------+-----------+----------+--------------+ Radial        Full                                                 +----------+------------+---------+-----------+----------+--------------+ Ulnar         Full                                                 +----------+------------+---------+-----------+----------+--------------+ Cephalic      Full                                                 +----------+------------+---------+-----------+----------+--------------+ Basilic                                             Not visualized +----------+------------+---------+-----------+----------+--------------+  Left Findings: +----------+------------+---------+-----------+----------+-----------------+ LEFT      CompressiblePhasicitySpontaneousProperties     Summary      +----------+------------+---------+-----------+----------+-----------------+ IJV           Full       Yes       Yes                                +----------+------------+---------+-----------+----------+-----------------+ Subclavian    Full       Yes       Yes                                +----------+------------+---------+-----------+----------+-----------------+ Axillary      Full       Yes       Yes                                +----------+------------+---------+-----------+----------+-----------------+  Brachial      Full       Yes       Yes                                +----------+------------+---------+-----------+----------+-----------------+ Radial        Full                                                    +----------+------------+---------+-----------+----------+-----------------+ Ulnar         Full                                                    +----------+------------+---------+-----------+----------+-----------------+ Cephalic      None                 No               Age Indeterminate  +----------+------------+---------+-----------+----------+-----------------+ Basilic       Full                                                    +----------+------------+---------+-----------+----------+-----------------+  Summary:  Right: No evidence of deep vein thrombosis in the upper extremity. No evidence of superficial vein thrombosis in the upper extremity.  Left: No evidence of deep vein thrombosis in the upper extremity. Findings consistent with age indeterminate superficial vein thrombosis involving the left cephalic vein.  *See table(s) above for measurements and observations.  Diagnosing physician: Jamelle Haring Electronically signed by Jamelle Haring on 09/18/2021 at 5:03:04 PM.    Final    ? ?Assessment/Plan: ?Right MCA infarction, left cerebellar infarction, status post decompressive craniectomy: The patient is neurologically stable.  We will continue supportive care.  I spoke with his wife. ? LOS: 7 days  ? ? ? ?[name] ?09/19/2021, 6:58 AM ? ? ? ? ?Patient ID: Jacob [name], male   DOB: 01-21-1976, 46 y.o.   MRN: [id number] ? ?

## 2021-09-19 NOTE — Progress Notes (Signed)
SLP Cancellation Note ? ?Patient Details ?Name: Jacob [name] ?MRN: [id number] ?DOB: May 05, 1976 ? ? ?Cancelled treatment:       Reason Eval/Treat Not Completed: Medical issues which prohibited therapy (Pt remains on the vent at this time. SLP will sign off, but please re-consult when clinically indicated.) ? ?Terren Jandreau I. Hardin Negus, [place], CCC-SLP ?Acute Rehabilitation Services ?Office number [phone] ?Pager [phone] ? ?[name] ?09/19/2021, 5:23 PM ?

## 2021-09-19 NOTE — Progress Notes (Addendum)
STROKE TEAM PROGRESS NOTE  ? ?INTERVAL HISTORY ?Patient is seen in his room with his wife at the bedside.  3% HTS has been stopped due to sodium of 160.  His exam on sedation is unchanged, but per RN he exhibits purposeful movement when sedation is off.  He has been hemodynamically stable. ?Repeat CT scan of the head today shows unchanged large right MCA infarct with cytotoxic edema and mass effect slightly diminished compared to last CT ?Vitals:  ? 09/19/21 1341 09/19/21 1400 09/19/21 1500 09/19/21 1518  ?BP:  (!) 148/81 (!) 189/96 (!) 154/86  ?Pulse: 95 79 93 79  ?Resp: (!) 21 17 19 18   ?Temp: 99.5 ?F (37.5 ?C) 99.1 ?F (37.3 ?C) 99.5 ?F (37.5 ?C) 98.4 ?F (36.9 ?C)  ?TempSrc:      ?SpO2: 99% 98% 99% 99%  ?Weight:      ?Height:      ? ?CBC:  ?Recent Labs  ?Lab 09/18/21 ?0607 09/19/21 ?0527  ?WBC 13.1* 10.7*  ?HGB 11.7* 9.8*  ?HCT 33.1* 29.5*  ?MCV 89.0 88.3  ?PLT 162 135*  ? ? ?Basic Metabolic Panel:  ?Recent Labs  ?Lab 09/17/21 ?0408 09/17/21 ?1230 09/17/21 ?1758 09/17/21 ?2351 09/18/21 ?[id number] 09/18/21 ?1222 09/19/21 ?[id number] 09/19/21 ?1220  ?NA 150*   < > 155*   < > 155*   < > 160* 161*  ?K 4.2  --   --   --  4.0  --  3.5  --   ?CL 125*  --   --   --  129*  --  >130*  --   ?CO2 21*  --   --   --  23  --  25  --   ?GLUCOSE 342*  --   --   --  305*  --  234*  --   ?BUN 21*  --   --   --  24*  --  33*  --   ?CREATININE 1.00  --   --   --  1.16  --  1.53*  --   ?CALCIUM 8.1*  --   --   --  8.1*  --  8.6*  --   ?MG 2.3  --  2.5*  --   --   --   --   --   ?PHOS 2.5  --  2.5  --   --   --   --   --   ? < > = values in this interval not displayed.  ? ? ?Lipid Panel:  ?Recent Labs  ?Lab 09/18/21 ?2143  ?TRIG 212*  ? ? ?HgbA1c:  ?No results for input(s): HGBA1C in the last 168 hours. ? ?Urine Drug Screen:  ?No results for input(s): LABOPIA, COCAINSCRNUR, LABBENZ, AMPHETMU, THCU, LABBARB in the last 168 hours. ?  ?Alcohol Level No results for input(s): ETH in the last 168 hours. ? ?IMAGING past 24 hours ?CT HEAD WO CONTRAST  (5MM) ? ?Result Date: 09/19/2021 ?CLINICAL DATA:  46 year old male with large right MCA territory infarct, reoccluded right M2 branch. Questionable new left cerebellar infarct on head CT 09/16/2021. Subsequent encounter. EXAM: CT HEAD WITHOUT CONTRAST TECHNIQUE: Contiguous axial images were obtained from the base of the skull through the vertex without intravenous contrast. RADIATION DOSE REDUCTION: This exam was performed according to the departmental dose-optimization program which includes automated exposure control, adjustment of the mA and/or kV according to patient size and/or use of iterative reconstruction technique. COMPARISON:  Head CT 09/16/2021.  Brain MRI and MRA  09/13/2021. FINDINGS: Brain: Large area of cytotoxic edema throughout the middle and posterior right MCA territory with some right MCA/PCA watershed involvement is stable since 09/16/2021. Right internal capsule involvement. No malignant hemorrhagic transformation. Slightly less effacement of the right lateral ventricle now. No significant midline shift. Cavum septum pellucidum (normal variant). No ventriculomegaly. Basilar cisterns remain patent. Gray-white matter differentiation in the left hemisphere and posterior fossa remains within normal limits. Artifactual hypodensity in the left cerebellum now suspected on 09/16/2021. Vascular: Mild Calcified atherosclerosis at the skull base. Skull: Right hemi craniectomy.  No new osseous abnormality. Sinuses/Orbits: Mildly increased sinus mucosal thickening. New left nasoenteric tube. Tympanic cavities and mastoids remain clear. Other: Postoperative changes from right hemi craniectomy. Postoperative drain removed. Skin staples still in place. Orbits soft tissues appear stable, negative. IMPRESSION: 1. Large Right MCA infarct with no malignant hemorrhagic transformation. Cytotoxic edema related mass effect slightly diminished since 09/16/2021, with overlying hemi craniectomy. 2. Normal (artifact now  suspected on 09/16/2021). No new intracranial abnormality. Electronically Signed   By: Genevie Ann M.D.   On: 09/19/2021 09:39   ? ?PHYSICAL EXAM ? ?Physical Exam  ?Constitutional: Appears well-developed and well-nourished middle-age is sedated and intubated African-American male, intubated hemi craniectomy surgical incision with clips seen on the right side ?Cardiovascular: Normal rate and regular rhythm.  ?Respiratory: Respirations synchronous with ventilator ? ?Neuro (intubated and on sedation ): Unresponsive eyes closed.  Right eye is swollen and difficult to open.  Does not follow commands.  PERRL, +corneal reflexes, oculocephalic reflex, cough and gag reflexes present, will move RUE slightly to noxious stimuli.   ? ? ?ASSESSMENT/PLAN ?Mr. BEAUMONT WHITENIGHT is a 46 y.o. male with history of HTN, DM2 presenting with left side facial droop, left arm weakness. Received TNKase. Neuro status declined during MRI (NIHSS 13),taken for thrombectomy with Dr. Karenann Cai. TICI3 recanalization of the right M2/MCA achieved.  MRI/MRA shows reoccluded right M2 branch with a right MCA territory infarct.  Continue with PT/OT/SLP evals.  Home blood pressure medications resumed. BP goal less than 160. Topamax and Tylenol #3 for headache management.  Head CT shows new 81mm leftward midline shift. Trasnferred back to ICU, decompressive hemicraniectomy performed by neurosurgery.  Follow-up CT shows 5 mm left midline shift.  Now intubated and on HTS at 50cc/hr. ? ?Stroke:  Right MCA due to right MCA occlusion  s/p TNK and mechanical thrombectomy with TICI3 revascularization etiology not certain, but most likely cryptogenic versus intracranial atherosclerosis..  Repeat CT scan 09/15/21 shows malignant cerebral edema with 12 mm right to left midline shift and trapping of the temporal horn s/p decompressive right hemicraniectomy 09/15/2021 ? ?Code Stroke CT head No acute abnormality ?CTA head & neck R short segment M1 occlusion, left  distal M1 high grade stenosis ?Post IR CT No hemorrhagic complication on flat panel CT. ?4/21- Early MRI Tiny acute infarcts in the upper division right MCA cortex. ?4/22- MRI right MCA territory infarct involving the cortex, sparing the basal ganglia.  2 mm midline shift.  No hemorrhage ?4/22- MRA reoccluded right M2 branch, intracranial atherosclerosis including advanced left M1 stenosis ?4/24-  Head CT-increased edema resulting in near complete effacement of the right lateral ventricle and 12 mm leftward midline shift ?4/25- Head CT Post OP- 5 mm left midline shift, new area of hypoattenuation of the left middle cerebellar peduncle and cerebellum ?4/28 head CT large right MCA infarct with no HT, diminished mass effect with no midline shift, hemicraniectomy noted ?2D Echo EF > 75% ?  LDL 96 ?HgbA1c 8.1 ?UDS negative ?VTE prophylaxis - SCDs ?No antithrombotic prior to admission, now on no antithrombotic secondary to recent surgery. ?Therapy recommendations: Inpatient rehab  ?disposition:  Pending ? ?Cerebral Edema (brain compression) ?Head CT 4/24 shows increased edema with new 28mm midline shift ?Na 160 this AM- discontinue HTS ?Decompressive hemicraniectomy performed 4/24 by neurosurgery ?No midline shift seen on head CT 4/28 ? ?Intracranial stenosis ?CTA head & neck R short segment M1 occlusion, left distal M1 high grade stenosis ?No significant extracranial stenosis ?Most likely related to long standing uncontrolled risk factors. ? ?Hypertension ?Home meds:  Amlodipine 10mg , lisinopril 10mg -resumed ?Stable ?BP goal 160  ?Long-term BP goal normotensive ?Avoid low BP ?PRN labetalol and hydralazine ? ?Hyperlipidemia ?Home meds:  Atorvastatin 20mg  ?LDL 96, goal < 70 ?Put on lipitor 40 ?Continue statin at discharge ? ?Diabetes type II Uncontrolled ?Home meds:  Glipizide, metformin ?HgbA1c 8.1, goal < 7.0 ?CBGs ?SSI with resist scale, Levemir 11 units twice daily ?DM coordinator consulted ? ?Other Stroke Risk  Factors ?Obesity, Body mass index is 30.57 kg/m?., BMI >/= 30 associated with increased stroke risk, recommend weight loss, diet and exercise as appropriate  ? ?Dysphagia  ?N.p.o. postop, still intubated ?continue SLP

## 2021-09-19 NOTE — Progress Notes (Addendum)
? ?NAME:  Jacob [name], MRN:  [id number], DOB:  April 30, 1976, LOS: 7 ?ADMISSION DATE:  09/12/2021, CONSULTATION DATE:  09/15/2021 ?REFERRING MD:  Leonie Man - Stroke, CHIEF COMPLAINT:  Cerebral edema.   ? ?History of Present Illness:  ?46 year old man with worsening cerebral edema. ? ?He presented 4/21 with left-sided weakness which was initially waxing and waning. CTA showed right M1 occlusion.  Further decline post TNK brought for thrombectomy at TICI 3 revascularization of right M2. ?Transfer to floor but experienced neurological decline 4/24.  CT showed malignant cerebral edema with 12 mm right to left midline shift and Trapping of the temporal horn. ? ?He was brought back to the ICU and started on 3% hypertonic saline and prepped for the operating room.  Platelets were transfused to offset effect of clopidogrel and aspirin. ? ?Taken to OR and underwent right frontotemporal parietal hemicraniectomy with placement of bone flap in abdominal subcutaneous tissue. Returned to ICU on vent. ? ?Pertinent  Medical History  ? ?Past Medical History:  ?Diagnosis Date  ? Allergy   ? Diabetes mellitus without complication ([place])   ? Hypertension   ? ?Significant Hospital Events: ?Including procedures, antibiotic start and stop dates in addition to other pertinent events   ?4/24 decompressive craniectomy. ?4/25 decreased midline shift on CT ?4/26 still on HT saline.  Switched from prop to precedex. BP meds titrated up in effort to decrease cleviprex. Cortrak placed.  ?4/27 Cleviprex maxed out, started on labetolol gtt. Started scheduled clonidine, also started coreg. Foley replaced and started on doxazosin due to urinary retention. HT infusion cut in half w/ plan to work towards stopping. Stopped fent gtt. Changed to PRN w/ plan to start precedex if needed.  Checking [place] of UE to eval fever. Also sending PCT ? ? ?Interim History / Subjective:  ?Off all sedation, moving right arm purposefully but not following commands.  ? ?Objective    ?Blood pressure (!) 177/84, pulse 81, temperature 99.3 ?F (37.4 ?C), resp. rate 18, height 6\' 1"  (1.854 m), weight 105.1 kg, SpO2 97 %. ?   ?Vent Mode: PRVC ?FiO2 (%):  [40 %] 40 % ?Set Rate:  [18 bmp] 18 bmp ?Vt Set:  [id number] mL] 630 mL ?PEEP:  [5 cmH20] 5 cmH20 ?Pressure Support:  [12 cmH20] 12 cmH20 ?Plateau Pressure:  [18 cmH20-23 cmH20] 23 cmH20  ? ?Intake/Output Summary (Last 24 hours) at 09/19/2021 0906 ?Last data filed at 09/19/2021 0747 ?Gross per 24 hour  ?Intake 1964.21 ml  ?Output 980 ml  ?Net 984.21 ml  ? ? ?Filed Weights  ? 09/12/21 0323 09/12/21 0611 09/17/21 0403  ?Weight: 107 kg 124 kg 105.1 kg  ? ? ?Examination: ?General 46 year old male sedated on vent  ?HENT right cran incision CD&I some facial swelling improving. Orally intubated,  still has copious oral secretions.  ?Pulm clear currently on PSV w/ excellent volumes and no sig WOB ?Card RRR w/ holisystolic HM ?Abd soft ?Ext warm. No sig swelling LEs does have Bilateral UE swelling. RUE PICC site unremarkable.  ?GU cl yellow ?Neuro PERL, + cough. Doesn't track or follow commands. Still eye opening apraxia, moves RUE spontaneously.  ? ? ?Assessment & Plan:  ?Principal Problem: ?  Acute right arterial ischemic stroke, MCA (middle cerebral artery) ([place]) ?Active Problems: ?  Cerebral edema ([place]) ?  Acute respiratory failure with hypoxia ([place]) ?  Diabetes mellitus without complication ([place]) ?  Essential hypertension ?  Hyperlipidemia ?  Urinary retention ?  Fever ? ? ?Acute  right MCA stroke (s/p tPA and mechanical thrombectomy) complicated by Cerebral edema requiring decompressive craniectomy on 4/24 ?Follow-up CT 4/28 shows large infarct R MCA, but essentially complete resolution of shift.  ?Plan ?Stopped all sedation - allow wake up to best exam. ?3% saline stopped.  ?Continue statin  ?Holding antiplatelet agents until cleared by surg  ?BP goal < 160  ?Will need extensive PT/OT ? ?Poorly controlled HTN. Now off cleviprex. Settles with prn  HDZN. ?Plan ?Continue current enteral medications: amlodipine, coreg, lisinopril, cardura, clonidine.  ? ?Respiratory failure  ?Remains of mechanical ventilation for airway protection ?Still has sig oral secretions and at this point feel like he will likely need trach ?Plan ?Cont full vent support w/ daily PSV trial  ?Daily assessment for extubation but mental status will dictate this.  ?VAP bundle  ? ?Fluid and electrolyte imbalance: therapeutic hypernatremia, secondary hyperchloremia ?Plan ?Start low dose free water ?May need diuresis for generalized edema.  ? ?DM w/ poorly controlled hyperglycemia - still above 200. ?Plan ?Cont SSI (resistant) ?Increase Levemir to 40 bid ?Continue aspart scheduled to 10 units.  ? ?Mild Leukocytosis spiked fever 4/26 - leukocytosis improving. Borderline PCT.  ?Plan ?Hold antibiotics for now.  ?bilateral UE [place] for DVT, still pending ? ?Urinary retention  ?Plan ?Replace foley ?Start doxazosin  ?Trial removal again 4/29 ? ?Best Practice (right click and "Reselect all SmartList Selections" daily)  ? ?Diet/type: tubefeeds ?DVT prophylaxis: SCD ?GI prophylaxis: PPI ?Lines: yes and it is still needed ?Foley:  Yes, and it is still needed ?Code Status:  full code ?Last date of multidisciplinary goals of care discussion [Wife updated at bedside 4/26] ? ?CRITICAL CARE ?Performed by: Kipp Brood ? ? ?Total critical care time: 40 minutes ? ?Critical care time was exclusive of separately billable procedures and treating other patients. ? ?Critical care was necessary to treat or prevent imminent or life-threatening deterioration. ? ?Critical care was time spent personally by me on the following activities: development of treatment plan with patient and/or surrogate as well as nursing, discussions with consultants, evaluation of patient's response to treatment, examination of patient, obtaining history from patient or surrogate, ordering and performing treatments and interventions, ordering and  review of laboratory studies, ordering and review of radiographic studies, pulse oximetry, re-evaluation of patient's condition and participation in multidisciplinary rounds. ? ?Kipp Brood, MD FRCPC ?ICU Physician ?[place]  ?Pager: [phone] ?Mobile: [phone] ?After hours: [phone]. ? ? ?

## 2021-09-19 NOTE — Progress Notes (Signed)
Patient transported to CT and back without complication. 

## 2021-09-19 NOTE — Progress Notes (Signed)
OT Cancellation Note ? ?Patient Details ?Name: Jacob [name] ?MRN: [id number] ?DOB: August 25, 1975 ? ? ?Cancelled Treatment:    Reason Eval/Treat Not Completed: Medical issues which prohibited therapy. Intubated. RN request hold. Will follow up as medically stable and as schedule allows.  ? ?[place] ?Roshana Shuffield MSOT, OTR/L ?Acute Rehab ?Pager: [phone] ?Office: [phone] ?09/19/2021, 12:41 PM ?

## 2021-09-19 NOTE — Progress Notes (Signed)
PT Cancellation Note ? ?Patient Details ?Name: Jacob [name] ?MRN: [id number] ?DOB: [birth year] ? ? ?Cancelled Treatment:    Reason Eval/Treat Not Completed: Medical issues which prohibited therapy. Pt remains intubated. PT will follow up on Monday unless pt becomes appropriate for mobilization prior. PT encourages spouse to perform PROM as able. ? ? ?Zenaida Niece ?09/19/2021, 12:32 PM ?

## 2021-09-19 NOTE — Progress Notes (Signed)
Inpatient Rehab Admissions Coordinator:  ? ?Pt. Intubated, not appropriate for CIR at this time. CIR to follow at a distance for now.  ? ?Clemens Catholic, MS, CCC-SLP ?Rehab Admissions Coordinator  ?[phone] (celll) ?[phone] (office) ? ?

## 2021-09-20 ENCOUNTER — Inpatient Hospital Stay (HOSPITAL_COMMUNITY): Payer: BC Managed Care – PPO

## 2021-09-20 DIAGNOSIS — G936 Cerebral edema: Secondary | ICD-10-CM | POA: Diagnosis not present

## 2021-09-20 DIAGNOSIS — I63511 Cerebral infarction due to unspecified occlusion or stenosis of right middle cerebral artery: Secondary | ICD-10-CM | POA: Diagnosis not present

## 2021-09-20 DIAGNOSIS — I6340 Cerebral infarction due to embolism of unspecified cerebral artery: Secondary | ICD-10-CM | POA: Insufficient documentation

## 2021-09-20 DIAGNOSIS — I639 Cerebral infarction, unspecified: Secondary | ICD-10-CM | POA: Diagnosis not present

## 2021-09-20 DIAGNOSIS — Z9889 Other specified postprocedural states: Secondary | ICD-10-CM | POA: Diagnosis not present

## 2021-09-20 DIAGNOSIS — R4182 Altered mental status, unspecified: Secondary | ICD-10-CM | POA: Diagnosis not present

## 2021-09-20 LAB — CBC
HCT: 30.2 % — ABNORMAL LOW (ref 39.0–52.0)
Hemoglobin: 10 g/dL — ABNORMAL LOW (ref 13.0–17.0)
MCH: 29.2 pg (ref 26.0–34.0)
MCHC: 33.1 g/dL (ref 30.0–36.0)
MCV: 88.3 fL (ref 80.0–100.0)
Platelets: 147 10*3/uL — ABNORMAL LOW (ref 150–400)
RBC: 3.42 MIL/uL — ABNORMAL LOW (ref 4.22–5.81)
RDW: 15.4 % (ref 11.5–15.5)
WBC: 12.8 10*3/uL — ABNORMAL HIGH (ref 4.0–10.5)
nRBC: 0 % (ref 0.0–0.2)

## 2021-09-20 LAB — BASIC METABOLIC PANEL
Anion gap: 3 — ABNORMAL LOW (ref 5–15)
BUN: 39 mg/dL — ABNORMAL HIGH (ref 6–20)
CO2: 27 mmol/L (ref 22–32)
Calcium: 8.8 mg/dL — ABNORMAL LOW (ref 8.9–10.3)
Chloride: 129 mmol/L — ABNORMAL HIGH (ref 98–111)
Creatinine, Ser: 1.15 mg/dL (ref 0.61–1.24)
GFR, Estimated: >60 mL/min (ref >60)
Glucose, Bld: 155 mg/dL — ABNORMAL HIGH (ref 70–99)
Potassium: 3.4 mmol/L — ABNORMAL LOW (ref 3.5–5.1)
Sodium: 159 mmol/L — ABNORMAL HIGH (ref 135–145)

## 2021-09-20 LAB — MAGNESIUM: Magnesium: 2.6 mg/dL — ABNORMAL HIGH (ref 1.7–2.4)

## 2021-09-20 LAB — GLUCOSE, CAPILLARY
Glucose-Capillary: 134 mg/dL — ABNORMAL HIGH (ref 70–99)
Glucose-Capillary: 135 mg/dL — ABNORMAL HIGH (ref 70–99)
Glucose-Capillary: 144 mg/dL — ABNORMAL HIGH (ref 70–99)
Glucose-Capillary: 147 mg/dL — ABNORMAL HIGH (ref 70–99)
Glucose-Capillary: 155 mg/dL — ABNORMAL HIGH (ref 70–99)
Glucose-Capillary: 158 mg/dL — ABNORMAL HIGH (ref 70–99)

## 2021-09-20 LAB — PROCALCITONIN: Procalcitonin: 0.43 ng/mL

## 2021-09-20 LAB — PHOSPHORUS: Phosphorus: 3.1 mg/dL (ref 2.5–4.6)

## 2021-09-20 IMAGING — DX DG CHEST 1V PORT
1 series · 1 of 1 positions shown · non-contrast
Comparison: Portable exam [D1] hours compared to [DATE]

CLINICAL DATA: Respirator dependence, airspace disease, pleural
effusion

EXAM:
PORTABLE CHEST 1 VIEW

[chest ap]
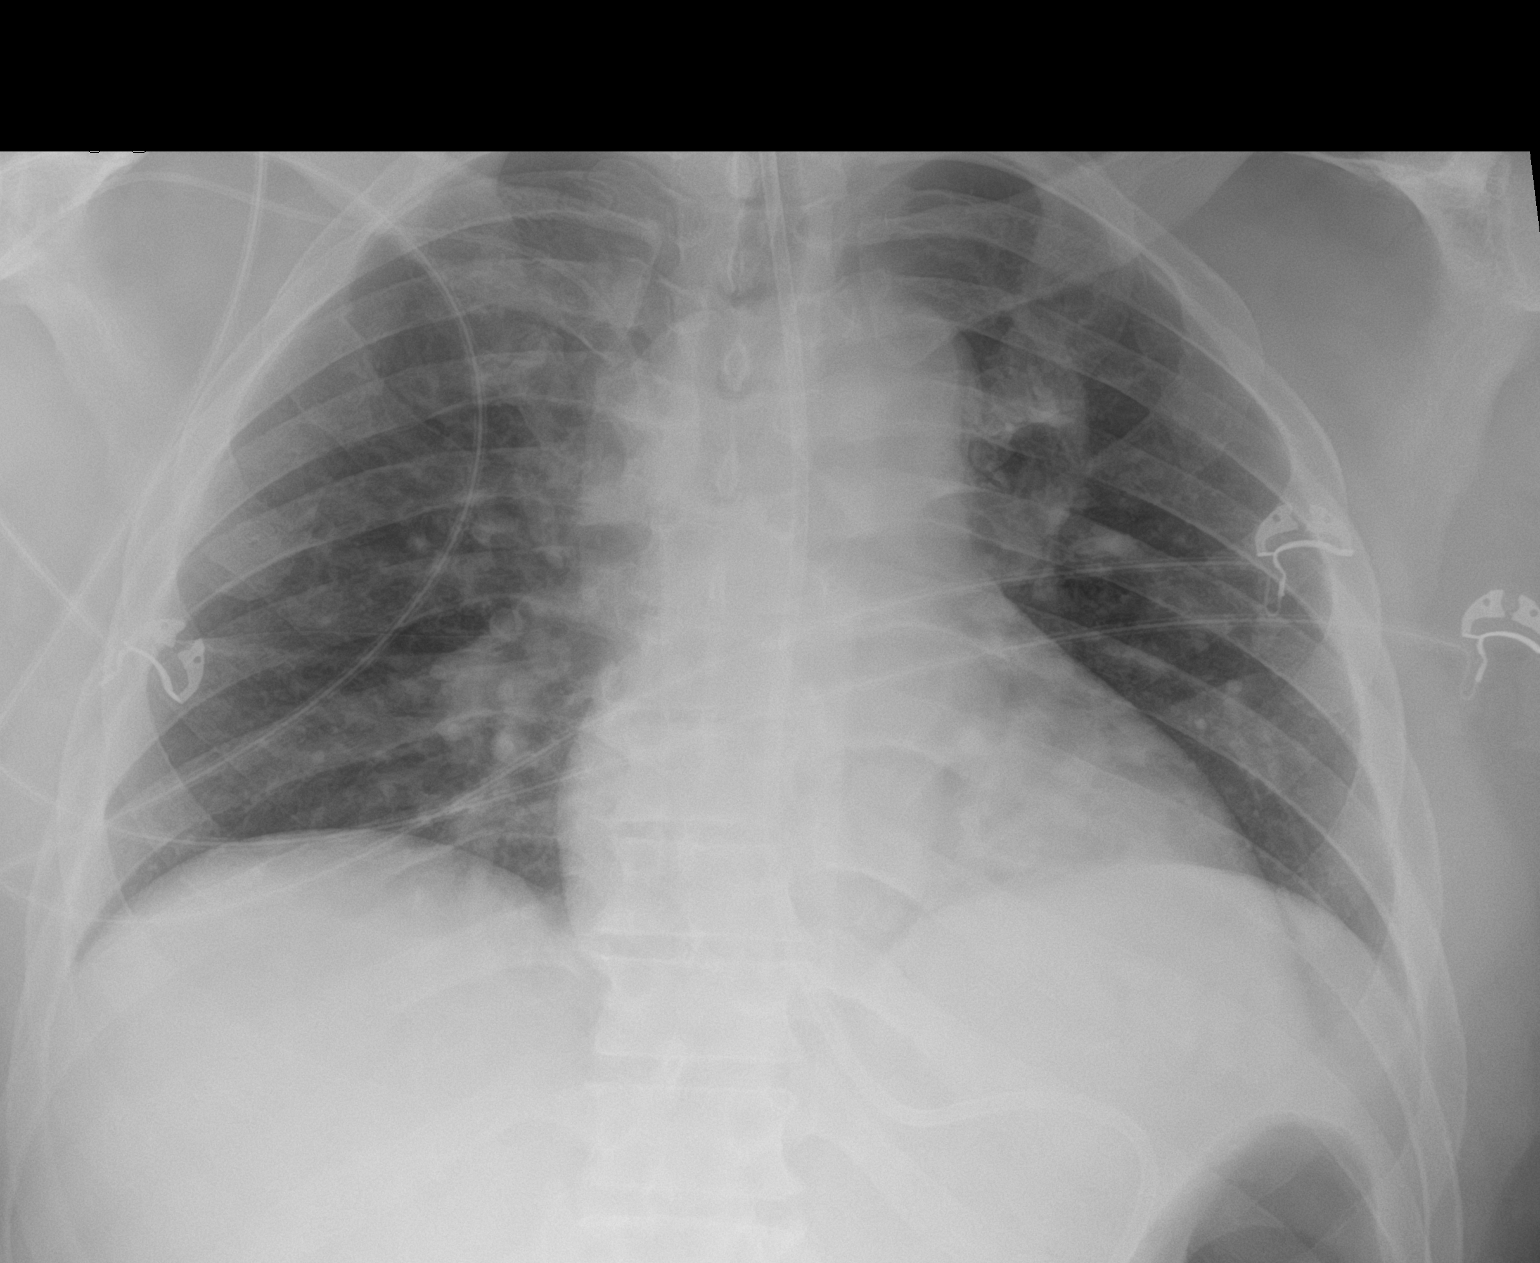

[1 of 1 positions shown; findings below may reference images not displayed]

FINDINGS: Tip of endotracheal tube projects 6.4 cm above carina.

Feeding tube extends into stomach.

Tip of RIGHT arm PICC line projects over SVC.

Normal heart size and mediastinal contours.

Perihilar infiltrates LEFT lung with mild atelectasis at RIGHT base.

No pleural effusion or pneumothorax.
IMPRESSION: LEFT perihilar infiltrates and RIGHT basilar atelectasis.

## 2021-09-20 MED ORDER — NUTRISOURCE FIBER PO PACK
1.0000 | PACK | Freq: Two times a day (BID) | ORAL | Status: DC
  Administered 2021-09-20 – 2021-09-23 (×5): 1
  Filled 2021-09-20 (×6): qty 1

## 2021-09-20 MED ORDER — AMANTADINE HCL 100 MG PO CAPS
100.0000 mg | ORAL_CAPSULE | Freq: Two times a day (BID) | ORAL | Status: DC
  Administered 2021-09-20 – 2021-09-24 (×10): 100 mg
  Filled 2021-09-20 (×11): qty 1

## 2021-09-20 MED ORDER — DOXAZOSIN MESYLATE 4 MG PO TABS
4.0000 mg | ORAL_TABLET | Freq: Every day | ORAL | Status: AC
  Administered 2021-09-20: 4 mg
  Filled 2021-09-20: qty 1

## 2021-09-20 MED ORDER — POTASSIUM CHLORIDE 20 MEQ PO PACK
20.0000 meq | PACK | Freq: Once | ORAL | Status: AC
  Administered 2021-09-20: 20 meq
  Filled 2021-09-20: qty 1

## 2021-09-20 MED ORDER — FREE WATER
200.0000 mL | Status: DC
  Administered 2021-09-20 – 2021-09-23 (×13): 200 mL

## 2021-09-20 MED ORDER — FUROSEMIDE 10 MG/ML IJ SOLN
40.0000 mg | Freq: Once | INTRAMUSCULAR | Status: AC
  Administered 2021-09-20: 40 mg via INTRAVENOUS
  Filled 2021-09-20: qty 4

## 2021-09-20 NOTE — Progress Notes (Addendum)
STROKE TEAM PROGRESS NOTE  ? ?INTERVAL HISTORY ?Patient is seen in his room with his wife at the bedside.  Na continues to be elevated to 159.  He has been hemodynamically stable and has had no acute events overnight.  His neurological exam is stable and his craniectomy flap is mildly tense and full.   ?Vitals:  ? 09/20/21 1000 09/20/21 1100 09/20/21 1127 09/20/21 1200  ?BP: (!) 158/88 (!) 175/93  (!) 148/86  ?Pulse: 72 73 72 72  ?Resp: 18 18 18 18   ?Temp: 99.5 ?F (37.5 ?C) 99.7 ?F (37.6 ?C) 99.5 ?F (37.5 ?C) 99.5 ?F (37.5 ?C)  ?TempSrc:      ?SpO2: 99% 100% 99% 100%  ?Weight:      ?Height:      ? ?CBC:  ?Recent Labs  ?Lab 09/19/21 ?[id number] 09/20/21 ?[id number]  ?WBC 10.7* 12.8*  ?HGB 9.8* 10.0*  ?HCT 29.5* 30.2*  ?MCV 88.3 88.3  ?PLT 135* 147*  ? ? ?Basic Metabolic Panel:  ?Recent Labs  ?Lab 09/17/21 ?1758 09/17/21 ?2351 09/19/21 ?[id number] 09/19/21 ?1220 09/20/21 ?[id number] 09/20/21 ?1043  ?NA 155*   < > 160* 161* 159*  --   ?K  --    < > 3.5  --  3.4*  --   ?CL  --    < > >130*  --  129*  --   ?CO2  --    < > 25  --  27  --   ?GLUCOSE  --    < > 234*  --  155*  --   ?BUN  --    < > 33*  --  39*  --   ?CREATININE  --    < > 1.53*  --  1.15  --   ?CALCIUM  --    < > 8.6*  --  8.8*  --   ?MG 2.5*  --   --   --   --  2.6*  ?PHOS 2.5  --   --   --   --  3.1  ? < > = values in this interval not displayed.  ? ? ?Lipid Panel:  ?Recent Labs  ?Lab 09/18/21 ?2143  ?TRIG 212*  ? ? ?HgbA1c:  ?No results for input(s): HGBA1C in the last 168 hours. ? ?Urine Drug Screen:  ?No results for input(s): LABOPIA, COCAINSCRNUR, LABBENZ, AMPHETMU, THCU, LABBARB in the last 168 hours. ?  ?Alcohol Level No results for input(s): ETH in the last 168 hours. ? ?IMAGING past 24 hours ?DG CHEST PORT 1 VIEW ? ?Result Date: 09/20/2021 ?CLINICAL DATA:  Respirator dependence, airspace disease, pleural effusion EXAM: PORTABLE CHEST 1 VIEW COMPARISON:  Portable exam 0938 hours compared to 09/17/2021 FINDINGS: Tip of endotracheal tube projects 6.4 cm above carina. Feeding  tube extends into stomach. Tip of RIGHT arm PICC line projects over SVC. Normal heart size and mediastinal contours. Perihilar infiltrates LEFT lung with mild atelectasis at RIGHT base. No pleural effusion or pneumothorax. IMPRESSION: LEFT perihilar infiltrates and RIGHT basilar atelectasis. Electronically Signed   By: Lavonia Dana M.D.   On: 09/20/2021 13:35   ? ?PHYSICAL EXAM ? ?Physical Exam  ?Constitutional: Appears well-developed and well-nourished middle-age is sedated and intubated African-American male, intubated hemi craniectomy surgical incision with clips seen on the right side ?Cardiovascular: Normal rate and regular rhythm.  ?Respiratory: Respirations synchronous with ventilator ? ?Neuro (intubated and on sedation ): Unresponsive eyes closed.  Right eye is swollen and difficult to open.  Does not follow commands.  PERRL, +corneal reflexes, oculocephalic reflex, cough and gag reflexes present, will move RUE spontaneously and RLE slightly to noxious stimuli.   ? ? ?ASSESSMENT/PLAN ?Jacob [name] is a 46 y.o. male with history of HTN, DM2 presenting with left side facial droop, left arm weakness. Received TNKase. Neuro status declined during MRI (NIHSS 13),taken for thrombectomy with Dr. Karenann Cai. TICI3 recanalization of the right M2/MCA achieved.  MRI/MRA shows reoccluded right M2 branch with a right MCA territory infarct.  Continue with PT/OT/SLP evals.  Home blood pressure medications resumed. BP goal less than 160. Topamax and Tylenol #3 for headache management.  Head CT shows new 3mm leftward midline shift. Trasnferred back to ICU, decompressive hemicraniectomy performed by neurosurgery.  Follow-up CT shows 5 mm left midline shift.  Now intubated and on HTS at 50cc/hr. ? ?Stroke:  Right MCA due to right MCA occlusion  s/p TNK and IR with TICI3, etiology not certain, but most likely cryptogenic versus intracranial atherosclerosis. ?Code Stroke CT head No acute abnormality ?CTA head &  neck R short segment M1 occlusion, left distal M1 high grade stenosis ?Post IR CT No hemorrhagic complication on flat panel CT. ?4/21- Early MRI Tiny acute infarcts in the upper division right MCA cortex. ?4/22- MRI right MCA territory infarct involving the cortex, sparing the basal ganglia.  2 mm midline shift.  No hemorrhage ?4/22- MRA reoccluded right M2 branch, intracranial atherosclerosis including advanced left M1 stenosis ?4/24-  Head CT-increased edema resulting in near complete effacement of the right lateral ventricle and 12 mm leftward midline shift ?4/25- Head CT Post OP- 5 mm left midline shift, new area of hypoattenuation of the left middle cerebellar peduncle and cerebellum ?4/28 head CT large right MCA infarct with no HT, diminished mass effect with no midline shift, hemicraniectomy noted ?2D Echo EF > 75% ?LDL 96 ?HgbA1c 8.1 ?UDS negative ?VTE prophylaxis - SCDs ?No antithrombotic prior to admission, now on no antithrombotic secondary to recent surgery. ?Therapy recommendations: Inpatient rehab  ?disposition:  Pending ? ?Cerebral Edema s/p [facility] ?Head CT 4/24 shows increased edema with new 69mm midline shift ?Decompressive hemicraniectomy performed 4/24 by neurosurgery ?Na 155->160->159 ?Off HTS->FW ?No midline shift seen on head CT 4/28 ?On keppra 500mg  bid ? ?Intracranial stenosis ?CTA head & neck R short segment M1 occlusion, left distal M1 high grade stenosis ?No significant extracranial stenosis ?Most likely related to long standing uncontrolled risk factors. ? ?Hypertension ?Home meds:  Amlodipine 10mg , lisinopril 10mg -resumed ?Stable ?BP goal <160  ?PRN labetalol and hydralazine ?Long-term BP goal normotensive ? ? ?Hyperlipidemia ?Home meds:  Atorvastatin 20mg  ?LDL 96, goal < 70 ?Put on lipitor 40 ?Continue statin at discharge ? ?Diabetes type II Uncontrolled ?Home meds:  Glipizide, metformin ?HgbA1c 8.1, goal < 7.0 ?CBGs ?SSI with resist scale, Levemir 11->40 units twice daily ?DM coordinator  consulted ? ?Other Stroke Risk Factors ?Obesity, Body mass index is 31.97 kg/m?., BMI >/= 30 associated with increased stroke risk, recommend weight loss, diet and exercise as appropriate  ? ?Dysphagia  ?N.p.o. postop, still intubated ?On TF @ 60 and FW 200 Q4 ? ?Respiratory failure ?Patient left intubated after surgery ?Ventilator management per CCM ?Will likely place tracheostomy 5/1 ? ?Al[place]y , MSN, AGACNP-BC ?[facility] ?See Amion for schedule and pager information ?09/20/2021 1:56 PM ? ?ATTENDING NOTE: ?I reviewed above note and agree with the assessment and plan. Pt was seen and examined.  ? ?46 year old male with history of hypertension, diabetes admitted for  left-sided facial droop, left-sided weakness.  Status post TNK, and IR with TICI3 of right M2.  MRI and MRA showed right MCA infarct with reoccluded right M2.  Patient developed headache and CT showed right MCA stroke with 12 mm midline shift.  Patient readmitted to ICU and is status post [facility].  Repeat CT midline shift decreased to 5 mm.  Repeat CT 4/28 showed nearly resolved midline shift.  LE venous Doppler negative. ?  ?On exam, wife at the bedside, pt intubated not on sedation, eyes closed but slightly open with repetitive pain stimulation, not following commands. With forced eye opening, eyes in right gaze position, not blinking to visual threat, not tracking, pupils 29mm and sluggish to light. Corneal reflex absent on the left, weakly reactive on the right, gag and cough present. Breathing over the vent.  Facial symmetry not able to test due to ET tube.  Tongue protrusion not cooperative. On pain stimulation, able to move right UE against gravity with some spontaneous movement. RLE slight withdraw to pain. LUE and LLE flaccid. Sensation, coordination and gait not tested. ? ?Patient was on hypertonic saline, now Na 159, off 3% saline, on free water.  Still intubated, likely needing tracheostomy next week.  Neurosurgery on  board, continue supportive care.  Vent management per CCM.  Risk factor modification.  PT/OT pending. ? ?For detailed assessment and plan, please refer to above as I have made changes wherever appropriat

## 2021-09-20 NOTE — Progress Notes (Signed)
Patient ID: Jacob [name], male   DOB: June 28, 1975, 46 y.o.   MRN: [name] ?No significant change neurologically flap is mildly tense pupils are pinpoint some flexion withdrawal right-sided with left hemiplegia continue supportive care with neurology ?

## 2021-09-21 ENCOUNTER — Inpatient Hospital Stay (HOSPITAL_COMMUNITY): Payer: BC Managed Care – PPO

## 2021-09-21 DIAGNOSIS — I10 Essential (primary) hypertension: Secondary | ICD-10-CM

## 2021-09-21 DIAGNOSIS — G936 Cerebral edema: Secondary | ICD-10-CM | POA: Diagnosis not present

## 2021-09-21 DIAGNOSIS — R4182 Altered mental status, unspecified: Secondary | ICD-10-CM | POA: Diagnosis not present

## 2021-09-21 DIAGNOSIS — I63511 Cerebral infarction due to unspecified occlusion or stenosis of right middle cerebral artery: Secondary | ICD-10-CM | POA: Diagnosis not present

## 2021-09-21 DIAGNOSIS — I639 Cerebral infarction, unspecified: Secondary | ICD-10-CM | POA: Diagnosis not present

## 2021-09-21 LAB — CBC
HCT: 30.4 % — ABNORMAL LOW (ref 39.0–52.0)
Hemoglobin: 10.2 g/dL — ABNORMAL LOW (ref 13.0–17.0)
MCH: 29.7 pg (ref 26.0–34.0)
MCHC: 33.6 g/dL (ref 30.0–36.0)
MCV: 88.6 fL (ref 80.0–100.0)
Platelets: 133 10*3/uL — ABNORMAL LOW (ref 150–400)
RBC: 3.43 MIL/uL — ABNORMAL LOW (ref 4.22–5.81)
RDW: 15.4 % (ref 11.5–15.5)
WBC: 11.7 10*3/uL — ABNORMAL HIGH (ref 4.0–10.5)
nRBC: 0 % (ref 0.0–0.2)

## 2021-09-21 LAB — BASIC METABOLIC PANEL
Anion gap: 7 (ref 5–15)
BUN: 41 mg/dL — ABNORMAL HIGH (ref 6–20)
CO2: 26 mmol/L (ref 22–32)
Calcium: 8.6 mg/dL — ABNORMAL LOW (ref 8.9–10.3)
Chloride: 123 mmol/L — ABNORMAL HIGH (ref 98–111)
Creatinine, Ser: 1.24 mg/dL (ref 0.61–1.24)
GFR, Estimated: >60 mL/min (ref >60)
Glucose, Bld: 178 mg/dL — ABNORMAL HIGH (ref 70–99)
Potassium: 3.8 mmol/L (ref 3.5–5.1)
Sodium: 156 mmol/L — ABNORMAL HIGH (ref 135–145)

## 2021-09-21 LAB — GLUCOSE, CAPILLARY
Glucose-Capillary: 171 mg/dL — ABNORMAL HIGH (ref 70–99)
Glucose-Capillary: 129 mg/dL — ABNORMAL HIGH (ref 70–99)
Glucose-Capillary: 160 mg/dL — ABNORMAL HIGH (ref 70–99)
Glucose-Capillary: 159 mg/dL — ABNORMAL HIGH (ref 70–99)
Glucose-Capillary: 146 mg/dL — ABNORMAL HIGH (ref 70–99)
Glucose-Capillary: 176 mg/dL — ABNORMAL HIGH (ref 70–99)

## 2021-09-21 MED ORDER — FUROSEMIDE 10 MG/ML IJ SOLN
40.0000 mg | Freq: Once | INTRAMUSCULAR | Status: AC
  Administered 2021-09-21: 40 mg via INTRAVENOUS
  Filled 2021-09-21: qty 4

## 2021-09-21 MED ORDER — BETHANECHOL CHLORIDE 10 MG PO TABS
10.0000 mg | ORAL_TABLET | Freq: Three times a day (TID) | ORAL | Status: DC
  Filled 2021-09-21: qty 1

## 2021-09-21 MED ORDER — FENTANYL CITRATE PF 50 MCG/ML IJ SOSY
50.0000 ug | PREFILLED_SYRINGE | INTRAMUSCULAR | Status: DC | PRN
  Administered 2021-09-21 – 2021-09-22 (×3): 50 ug via INTRAVENOUS
  Filled 2021-09-21 (×3): qty 1

## 2021-09-21 MED ORDER — ASPIRIN 81 MG PO CHEW
81.0000 mg | CHEWABLE_TABLET | Freq: Every day | ORAL | Status: DC
  Administered 2021-09-21 – 2021-10-04 (×14): 81 mg
  Filled 2021-09-21 (×13): qty 1

## 2021-09-21 MED ORDER — BETHANECHOL CHLORIDE 10 MG PO TABS: 10.0000 mg | ORAL_TABLET | Freq: Three times a day (TID) | ORAL | Status: DC

## 2021-09-21 MED ORDER — HYDRALAZINE HCL 50 MG PO TABS
50.0000 mg | ORAL_TABLET | Freq: Three times a day (TID) | ORAL | Status: DC
  Administered 2021-09-21: 50 mg
  Filled 2021-09-21 (×2): qty 1

## 2021-09-21 MED ORDER — BETHANECHOL CHLORIDE 10 MG PO TABS
10.0000 mg | ORAL_TABLET | Freq: Three times a day (TID) | ORAL | Status: DC
  Administered 2021-09-21 – 2021-09-23 (×9): 10 mg
  Filled 2021-09-21 (×8): qty 1

## 2021-09-21 MED ORDER — CARVEDILOL 12.5 MG PO TABS
25.0000 mg | ORAL_TABLET | Freq: Two times a day (BID) | ORAL | Status: DC
  Administered 2021-09-21 – 2021-10-08 (×33): 25 mg
  Filled 2021-09-21 (×35): qty 2

## 2021-09-21 NOTE — Plan of Care (Signed)
?  Problem: Self-Care: Goal: Ability to participate in self-care as condition permits will improve Outcome: Progressing   Problem: Ischemic Stroke/TIA Tissue Perfusion: Goal: Complications of ischemic stroke/TIA will be minimized Outcome: Progressing   

## 2021-09-21 NOTE — Progress Notes (Addendum)
? ?NAME:  Jacob [name], MRN:  [id number], DOB:  1976/03/03, LOS: 9 ?ADMISSION DATE:  09/12/2021, CONSULTATION DATE:  09/15/2021 ?REFERRING MD:  Leonie Man - Stroke, CHIEF COMPLAINT:  Cerebral edema.   ? ?History of Present Illness:  ?46 year old man with worsening cerebral edema. ? ?He presented 4/21 with left-sided weakness which was initially waxing and waning. CTA showed right M1 occlusion.  Further decline post TNK brought for thrombectomy at TICI 3 revascularization of right M2. ?Transfer to floor but experienced neurological decline 4/24.  CT showed malignant cerebral edema with 12 mm right to left midline shift and Trapping of the temporal horn. ? ?He was brought back to the ICU and started on 3% hypertonic saline and prepped for the operating room.  Platelets were transfused to offset effect of clopidogrel and aspirin. ? ?Taken to OR and underwent right frontotemporal parietal hemicraniectomy with placement of bone flap in abdominal subcutaneous tissue. Returned to ICU on vent. ? ?Pertinent  Medical History  ? ?Past Medical History:  ?Diagnosis Date  ? Allergy   ? Diabetes mellitus without complication ([place])   ? Hypertension   ? ?Significant Hospital Events: ?Including procedures, antibiotic start and stop dates in addition to other pertinent events   ?4/24 decompressive craniectomy. ?4/25 decreased midline shift on CT ?4/26 still on HT saline.  Switched from prop to precedex. BP meds titrated up in effort to decrease cleviprex. Cortrak placed.  ?4/27 Cleviprex maxed out, started on labetolol gtt. Started scheduled clonidine, also started coreg. Foley replaced and started on doxazosin due to urinary retention. HT infusion cut in half w/ plan to work towards stopping. Stopped fent gtt. Changed to PRN w/ plan to start precedex if needed.  Checking [place] of UE to eval fever. Also sending PCT ? ? ?Interim History / Subjective:  ?Off all sedation, moving right arm purposefully but not following commands.  ? ?Objective    ?Blood pressure (!) 170/79, pulse 78, temperature 100.2 ?F (37.9 ?C), temperature source Axillary, resp. rate (!) 22, height 6\' 1"  (1.854 m), weight 110 kg, SpO2 97 %. ?   ?Vent Mode: PSV;CPAP ?FiO2 (%):  [40 %] 40 % ?Set Rate:  [18 bmp] 18 bmp ?Vt Set:  [id number] mL] 630 mL ?PEEP:  [5 cmH20] 5 cmH20 ?Pressure Support:  [15 cmH20] 15 cmH20 ?Plateau Pressure:  [21 [id number] cmH20] 22 cmH20  ? ?Intake/Output Summary (Last 24 hours) at 09/21/2021 1230 ?Last data filed at 09/21/2021 0800 ?Gross per 24 hour  ?Intake 2079.95 ml  ?Output 1630 ml  ?Net 449.95 ml  ? ? ?Filed Weights  ? 09/17/21 0403 09/20/21 0500 09/21/21 0500  ?Weight: 105.1 kg 109.9 kg 110 kg  ? ? ?Examination: ?General 46 year old male sedated on vent  ?HENT right cran incision CD&I some facial swelling improving. Orally intubated,  still has copious oral secretions.  ?Pulm clear currently on PSV w/ excellent volumes and no sig WOB ?Card RRR w/ holisystolic HM ?Abd soft ?Ext warm. No sig swelling LEs does have Bilateral UE swelling. RUE PICC site unremarkable.  ?GU cl yellow ?Neuro PERL, + cough. Doesn't track or follow commands. Still eye opening apraxia, moves RUE spontaneously.  ? ? ?Assessment & Plan:  ?Active Problems: ?  Cerebral edema ([place]) ?  Acute respiratory failure with hypoxia ([place]) ?  Diabetes mellitus without complication ([place]) ?  Essential hypertension ?  Hyperlipidemia ?  Urinary retention ?  Fever ?  Hypernatremia ?  Cerebral embolism with cerebral infarction ? ? ?Acute  right MCA stroke (s/p tPA and mechanical thrombectomy) complicated by Cerebral edema requiring decompressive craniectomy on 4/24 ?Follow-up CT 4/28 shows large infarct R MCA, but essentially complete resolution of shift.  ?Plan ?Stopped all sedation - allow wake up to best exam. ?3% saline stopped.  ?Continue statin  ?Holding antiplatelet agents until cleared by surg  ?BP goal < 160  ?Will need tracheostomy and PEG tube.  ? ?Poorly controlled HTN. Now off cleviprex. Settles with  prn HDZN. ?Plan ?Continue current enteral medications: amlodipine, coreg, lisinopril, cardura, clonidine.  ? ?Respiratory failure  ?Remains of mechanical ventilation for airway protection ?Still has sig oral secretions and at this point feel like he will likely need trach ?Plan ?Cont full vent support w/ daily PSV trial  ?Daily assessment for extubation but mental status will dictate this.  ?VAP bundle  ? ?Fluid and electrolyte imbalance: therapeutic hypernatremia, secondary hyperchloremia ?Plan ?Start low dose free water ?May need diuresis for generalized edema.  ? ?DM w/ poorly controlled hyperglycemia - still above 200. ?Plan ?Cont SSI (resistant) ?Increase Levemir to 40 bid ?Continue aspart scheduled to 10 units.  ? ?Mild Leukocytosis spiked fever 4/26 - leukocytosis improving. Borderline PCT.  ?Plan ?Hold antibiotics for now.  ?bilateral UE [place] for DVT, still pending ? ?Urinary retention  ?Plan ?Replace foley ?Started doxazosin  ?Trial removal again 4/29 ? ?Best Practice (right click and "Reselect all SmartList Selections" daily)  ? ?Diet/type: tubefeeds ?DVT prophylaxis: SCD ?GI prophylaxis: PPI ?Lines: yes and it is still needed ?Foley:  Yes, and it is still needed ?Code Status:  full code ?Last date of multidisciplinary goals of care discussion [Wife updated at bedside 4/26] ? ?CRITICAL CARE ?Performed by: Kipp Brood ? ? ?Total critical care time: 35 minutes ? ?Critical care time was exclusive of separately billable procedures and treating other patients. ? ?Critical care was necessary to treat or prevent imminent or life-threatening deterioration. ? ?Critical care was time spent personally by me on the following activities: development of treatment plan with patient and/or surrogate as well as nursing, discussions with consultants, evaluation of patient's response to treatment, examination of patient, obtaining history from patient or surrogate, ordering and performing treatments and interventions,  ordering and review of laboratory studies, ordering and review of radiographic studies, pulse oximetry, re-evaluation of patient's condition and participation in multidisciplinary rounds. ? ?Kipp Brood, MD FRCPC ?ICU Physician ?[place]  ?Pager: [phone] ?Mobile: [phone] ?After hours: [phone]. ? ? ?

## 2021-09-21 NOTE — Procedures (Signed)
Routine EEG Report ? ?HEATON WINTERBOTTOM is a 46 y.o. male with a history of right MCA stroke s/p hemicraniectomy who is undergoing an EEG to evaluate for seizures. ? ?Report: This EEG was acquired with electrodes placed according to the International 10-20 electrode system (including Fp1, Fp2, F3, F4, C3, C4, P3, P4, O1, O2, T3, T4, T5, T6, A1, A2, Fz, Cz, Pz). The following electrodes were missing or displaced: Fz/Cz/Pz displaced 2/2 large central surgical incision. ? ?The best background was 5-7 Hz. This activity is reactive to stimulation. No clear waking rhythm or sleep architecture was identified. There was focal slowing over the right hemisphere. There were no interictal epileptiform discharges. There were no electrographic seizures identified. Photic stimulation and hyperventilation were not performed.  ? ?Impression and clinical correlation: This EEG was obtained while intubated and sedated on fentanyl and is abnormal due to: ?- Mild diffuse slowing indicative of global cerebral dysfunction ?- Right focal slowing indicative of superimposed focal cerebral dysfunction in that region ? ?Epileptiform abnormalities were not seen during this recording. ? ?Su Monks, MD ?[facility] ?[phone] ? ?If 7pm- 7am, please page neurology on call as listed in [place].  ?

## 2021-09-21 NOTE — Progress Notes (Signed)
EEG complete - results pending 

## 2021-09-21 NOTE — Progress Notes (Addendum)
STROKE TEAM PROGRESS NOTE  ? ?INTERVAL HISTORY ?Patient is seen in his room with his wife at the bedside.  Na decreased to 156.  He has been hemodynamically stable and his neurological exam is stable.  Likely trach placement tomorrow.  ? ?Vitals:  ? 09/21/21 0800 09/21/21 1045 09/21/21 1046 09/21/21 1157  ?BP: (!) 170/79     ?Pulse: 79 78    ?Resp: (!) 22 (!) 22    ?Temp: 99.4 ?F (37.4 ?C)   100.2 ?F (37.9 ?C)  ?TempSrc: Axillary   Axillary  ?SpO2: 98% 97% 97%   ?Weight:      ?Height:      ? ?CBC:  ?Recent Labs  ?Lab 09/20/21 ?[id number] 09/21/21 ?0206  ?WBC 12.8* 11.7*  ?HGB 10.0* 10.2*  ?HCT 30.2* 30.4*  ?MCV 88.3 88.6  ?PLT 147* 133*  ? ? ?Basic Metabolic Panel:  ?Recent Labs  ?Lab 09/17/21 ?1758 09/17/21 ?2351 09/20/21 ?[id number] 09/20/21 ?1043 09/21/21 ?0206  ?NA 155*   < > 159*  --  156*  ?K  --    < > 3.4*  --  3.8  ?CL  --    < > 129*  --  123*  ?CO2  --    < > 27  --  26  ?GLUCOSE  --    < > 155*  --  178*  ?BUN  --    < > 39*  --  41*  ?CREATININE  --    < > 1.15  --  1.24  ?CALCIUM  --    < > 8.8*  --  8.6*  ?MG 2.5*  --   --  2.6*  --   ?PHOS 2.5  --   --  3.1  --   ? < > = values in this interval not displayed.  ? ? ?Lipid Panel:  ?Recent Labs  ?Lab 09/18/21 ?2143  ?TRIG 212*  ? ? ?HgbA1c:  ?No results for input(s): HGBA1C in the last 168 hours. ? ?Urine Drug Screen:  ?No results for input(s): LABOPIA, COCAINSCRNUR, LABBENZ, AMPHETMU, THCU, LABBARB in the last 168 hours. ?  ?Alcohol Level No results for input(s): ETH in the last 168 hours. ? ?IMAGING past 24 hours ?No results found. ? ?PHYSICAL EXAM ? ?Physical Exam  ?Constitutional: Appears well-developed and well-nourished middle-age is sedated and intubated African-American male, intubated hemi craniectomy surgical incision with clips seen on the right side ?Cardiovascular: Normal rate and regular rhythm.  ?Respiratory: Respirations synchronous with ventilator ? ?Neuro (intubated and on sedation ): Unresponsive eyes closed.  Right eye is swollen and difficult  to open.  Does not follow commands.  PERRL,  oculocephalic reflex, cough and gag reflexes present, will move RUE spontaneously and RLE slightly to noxious stimuli.   ? ? ?ASSESSMENT/PLAN ?Mr. XAINE BOYACK is a 46 y.o. male with history of HTN, DM2 presenting with left side facial droop, left arm weakness. Received TNKase. Neuro status declined during MRI (NIHSS 13),taken for thrombectomy with Dr. Karenann Cai. TICI3 recanalization of the right M2/MCA achieved.  MRI/MRA shows reoccluded right M2 branch with a right MCA territory infarct.  Continue with PT/OT/SLP evals.  Home blood pressure medications resumed. BP goal less than 160. Topamax and Tylenol #3 for headache management.  Head CT shows new 57mm leftward midline shift. Trasnferred back to ICU, decompressive hemicraniectomy performed by neurosurgery.  Follow-up CT shows 5 mm left midline shift.  Now intubated.  HTS d/c'd due to Na of 160. ? ?Stroke:  Right MCA due to right MCA occlusion  s/p TNK and IR with TICI3, etiology not certain, but most likely cryptogenic versus intracranial atherosclerosis. ?Code Stroke CT head No acute abnormality ?CTA head & neck R short segment M1 occlusion, left distal M1 high grade stenosis ?Post IR CT No hemorrhagic complication on flat panel CT. ?4/21- Early MRI Tiny acute infarcts in the upper division right MCA cortex. ?4/22- MRI right MCA territory infarct involving the cortex, sparing the basal ganglia.  2 mm midline shift.  No hemorrhage ?4/22- MRA reoccluded right M2 branch, intracranial atherosclerosis including advanced left M1 stenosis ?4/24-  Head CT-increased edema resulting in near complete effacement of the right lateral ventricle and 12 mm leftward midline shift ?4/25- Head CT Post OP- 5 mm left midline shift, new area of hypoattenuation of the left middle cerebellar peduncle and cerebellum ?4/28 head CT large right MCA infarct with no HT, diminished mass effect with no midline shift, hemicraniectomy  noted ?2D Echo EF > 75% ?LDL 96 ?HgbA1c 8.1 ?UDS negative ?VTE prophylaxis - SCDs ?No antithrombotic prior to admission, now on ASA 81.  ?Therapy recommendations: pending ?disposition:  Pending ? ?Cerebral Edema s/p [facility] ?Head CT 4/24 shows increased edema with new 64mm midline shift ?Decompressive hemicraniectomy performed 4/24 by neurosurgery ?Na 155->160->159-> 156 ?Off HTS->FW ?No midline shift seen on head CT 4/28 ?On keppra 500mg  bid ? ?Intracranial stenosis ?CTA head & neck R short segment M1 occlusion, left distal M1 high grade stenosis ?No significant extracranial stenosis ?Most likely related to long standing uncontrolled risk factors. ? ?Hypertension ?Home meds:  Amlodipine 10mg , lisinopril 10mg -resumed ?Stable ?BP goal <160  ?On coreg 25, amlodipine 10, clonidine 0.2 Q8, lisinopril 40  ?PRN labetalol and hydralazine ?Long-term BP goal normotensive ? ?Hyperlipidemia ?Home meds:  Atorvastatin 20mg  ?LDL 96, goal < 70 ?Put on lipitor 40 ?Continue statin at discharge ? ?Diabetes type II Uncontrolled ?Home meds:  Glipizide, metformin ?HgbA1c 8.1, goal < 7.0 ?CBGs ?SSI with resist scale, Levemir 11->40 units twice daily ?DM coordinator consulted ? ?Other Stroke Risk Factors ?Obesity, Body mass index is 31.99 kg/m?., BMI >/= 30 associated with increased stroke risk, recommend weight loss, diet and exercise as appropriate  ? ?Dysphagia  ?N.p.o. postop, still intubated ?On TF @ 60 and FW 200 Q4 ? ?Respiratory failure ?Patient left intubated after surgery ?Ventilator management per CCM ?Will likely place tracheostomy 5/1 ? ?[place] , MSN, AGACNP-BC ?[facility] ?See Amion for schedule and pager information ?09/21/2021 1:07 PM ? ?ATTENDING NOTE: ?I reviewed above note and agree with the assessment and plan. Pt was seen and examined.  ? ?Wife at bedside.  Patient still intubated, intermittent open eyes with stimulation, however not follow commands.  Still has right gaze preference and left  hemiplegia.  Pending tracheostomy early next week. BP on the higher end, increased Coreg dose.  Start aspirin 81.  Continue tube feeding and free water.  Vent management per CCM. I had long discussion with wife at bedside, updated pt current condition, treatment plan and potential prognosis, and answered all the questions.  She expressed understanding and appreciation.  ? ?For detailed assessment and plan, please refer to above as I have made changes wherever appropriate.  ? ?Rosalin Hawking, MD PhD ?Stroke Neurology ?09/21/2021 ?6:52 PM ? ?This patient is critically ill due to right MCA stroke, cerebral edema status post [facility], uncontrolled diabetes, and at significant risk of neurological worsening, death form brain herniation, sepsis, DKA, respiratory failure. This patient's care  requires constant monitoring of vital signs, hemodynamics, respiratory and cardiac monitoring, review of multiple databases, neurological assessment, discussion with family, other specialists and medical decision making of high complexity. I spent 40 minutes of neurocritical care time in the care of this patient. ? ? ? ?To contact Stroke Continuity provider, please refer to [email or web address] ?After hours, contact General Neurology ? ?

## 2021-09-21 NOTE — Progress Notes (Signed)
Inpatient Rehab Admissions Coordinator:  ? ?Pt. Remains sedated on ventilator. CIR will sign off. MD may reconsult if appropriate once off the vent  ? ?.Clemens Catholic, MS, CCC-SLP ?Rehab Admissions Coordinator  ?[phone] (celll) ?[phone] (office) ? ?

## 2021-09-21 NOTE — Progress Notes (Signed)
? ?NAME:  Jacob [name], MRN:  [id number], DOB:  May 20, 1976, LOS: 9 ?ADMISSION DATE:  09/12/2021, CONSULTATION DATE:  09/15/2021 ?REFERRING MD:  Leonie Man - Stroke, CHIEF COMPLAINT:  Cerebral edema.   ? ?History of Present Illness:  ?46 year old man with worsening cerebral edema. ? ?He presented 4/21 with left-sided weakness which was initially waxing and waning. CTA showed right M1 occlusion.  Further decline post TNK brought for thrombectomy at [facility] 3 revascularization of right M2. ?Transfer to floor but experienced neurological decline 4/24.  CT showed malignant cerebral edema with 12 mm right to left midline shift and Trapping of the temporal horn. ? ?He was brought back to the ICU and started on 3% hypertonic saline and prepped for the operating room.  Platelets were transfused to offset effect of clopidogrel and aspirin. ? ?Taken to OR and underwent right frontotemporal parietal hemicraniectomy with placement of bone flap in abdominal subcutaneous tissue. Returned to ICU on vent. ? ?Pertinent  Medical History  ? ?Past Medical History:  ?Diagnosis Date  ? Allergy   ? Diabetes mellitus without complication ([place])   ? Hypertension   ? ?Significant Hospital Events: ?Including procedures, antibiotic start and stop dates in addition to other pertinent events   ?4/24 decompressive craniectomy. ?4/25 decreased midline shift on CT ?4/26 still on HT saline.  Switched from prop to precedex. BP meds titrated up in effort to decrease cleviprex. Cortrak placed.  ?4/27 Cleviprex maxed out, started on labetolol gtt. Started scheduled clonidine, also started coreg. Foley replaced and started on doxazosin due to urinary retention. HT infusion cut in half w/ plan to work towards stopping. Stopped fent gtt. Changed to PRN w/ plan to start precedex if needed.  Checking [place] of UE to eval fever. Also sending PCT ? ? ?Interim History / Subjective:  ?Off all sedation, starting to have more spontaneous movement.  ? ?Objective   ?Blood pressure  (!) 170/79, pulse 78, temperature 100.2 ?F (37.9 ?C), temperature source Axillary, resp. rate (!) 22, height 6\' 1"  (1.854 m), weight 110 kg, SpO2 97 %. ?   ?Vent Mode: PSV;CPAP ?FiO2 (%):  [40 %] 40 % ?Set Rate:  [18 bmp] 18 bmp ?Vt Set:  [id number] mL] 630 mL ?PEEP:  [5 cmH20] 5 cmH20 ?Pressure Support:  [15 cmH20] 15 cmH20 ?Plateau Pressure:  [21 [id number] cmH20] 22 cmH20  ? ?Intake/Output Summary (Last 24 hours) at 09/21/2021 1233 ?Last data filed at 09/21/2021 0800 ?Gross per 24 hour  ?Intake 2079.95 ml  ?Output 1630 ml  ?Net 449.95 ml  ? ? ?Filed Weights  ? 09/17/21 0403 09/20/21 0500 09/21/21 0500  ?Weight: 105.1 kg 109.9 kg 110 kg  ? ? ?Examination: ?General 46 year old male sedated on vent  ?HENT right cran incision CD&I some facial swelling improving. Orally intubated,  still has copious oral secretions.  ?Pulm clear currently on PSV w/ excellent volumes and no sig WOB ?Card RRR w/ holisystolic HM ?Abd soft ?Ext warm. No sig swelling LEs does have Bilateral UE swelling. RUE PICC site unremarkable.  ?GU cl yellow ?Neuro PERL, + cough. Doesn't track or follow commands. Still eye opening apraxia, moves right side spontaneously  ? ? ?Assessment & Plan:  ?Active Problems: ?  Cerebral edema ([place]) ?  Acute respiratory failure with hypoxia ([place]) ?  Diabetes mellitus without complication ([place]) ?  Essential hypertension ?  Hyperlipidemia ?  Urinary retention ?  Fever ?  Hypernatremia ?  Cerebral embolism with cerebral infarction ? ? ?Acute right  MCA stroke (s/p tPA and mechanical thrombectomy) complicated by Cerebral edema requiring decompressive craniectomy on 4/24 ?Follow-up CT 4/28 shows large infarct R MCA, but essentially complete resolution of shift.  ?Plan ?Stopped all sedation - allow wake up to best exam. ?3% saline stopped.  ?Continue statin  ?Holding antiplatelet agents until cleared by surg  ?BP goal < 160  ?Will need tracheostomy and PEG tube.  ? ?Poorly controlled HTN. Now off cleviprex. Settles with prn  HDZN. ?Plan ?Continue current enteral medications: amlodipine, coreg, lisinopril, cardura, clonidine.  ? ?Respiratory failure  ?Remains of mechanical ventilation for airway protection ?Still has sig oral secretions and at this point feel like he will likely need trach ?Plan ?Cont full vent support w/ daily PSV trial  ?Daily assessment for extubation but mental status will dictate this.  ?VAP bundle  ?Will need trach  ? ?Fluid and electrolyte imbalance: therapeutic hypernatremia, secondary hyperchloremia ?Plan ?Continue low dose free water ?Gentle diuresis for generalized edema.  ? ?DM w/ better control ?Plan ?Continue current regimen  ? ?Urinary retention  ?Plan ?Still having retention in spite of doxazosin ?Start urecholine ? ?Best Practice (right click and "Reselect all SmartList Selections" daily)  ? ?Diet/type: tubefeeds ?DVT prophylaxis: LMWH ?GI prophylaxis: PPI ?Lines: yes and it is still needed ?Foley:  N/A may need to be reinserted retention.  ?Code Status:  full code ?Last date of multidisciplinary goals of care discussion [Wife updated at bedside 4/26] ? ?CRITICAL CARE ?Performed by: Kipp Brood ? ? ?Total critical care time: 35 minutes ? ?Critical care time was exclusive of separately billable procedures and treating other patients. ? ?Critical care was necessary to treat or prevent imminent or life-threatening deterioration. ? ?Critical care was time spent personally by me on the following activities: development of treatment plan with patient and/or surrogate as well as nursing, discussions with consultants, evaluation of patient's response to treatment, examination of patient, obtaining history from patient or surrogate, ordering and performing treatments and interventions, ordering and review of laboratory studies, ordering and review of radiographic studies, pulse oximetry, re-evaluation of patient's condition and participation in multidisciplinary rounds. ? ?Kipp Brood, MD FRCPC ?ICU  Physician ?[place]  ?Pager: [phone] ?Mobile: [phone] ?After hours: [phone]. ? ? ?

## 2021-09-21 NOTE — Progress Notes (Signed)
Patient ID: Jacob [name], male   DOB: 05/13/1976, 46 y.o.   MRN: [id number] ?Stable exam pupils pinpoint right side has some flexion withdrawal flap is moderately tense ?

## 2021-09-22 ENCOUNTER — Inpatient Hospital Stay (HOSPITAL_COMMUNITY): Payer: BC Managed Care – PPO

## 2021-09-22 DIAGNOSIS — Z9911 Dependence on respirator [ventilator] status: Secondary | ICD-10-CM

## 2021-09-22 DIAGNOSIS — K567 Ileus, unspecified: Secondary | ICD-10-CM

## 2021-09-22 DIAGNOSIS — J9601 Acute respiratory failure with hypoxia: Secondary | ICD-10-CM | POA: Diagnosis not present

## 2021-09-22 DIAGNOSIS — I639 Cerebral infarction, unspecified: Secondary | ICD-10-CM | POA: Diagnosis not present

## 2021-09-22 DIAGNOSIS — I63511 Cerebral infarction due to unspecified occlusion or stenosis of right middle cerebral artery: Secondary | ICD-10-CM | POA: Diagnosis not present

## 2021-09-22 DIAGNOSIS — G936 Cerebral edema: Secondary | ICD-10-CM | POA: Diagnosis not present

## 2021-09-22 DIAGNOSIS — J9622 Acute and chronic respiratory failure with hypercapnia: Secondary | ICD-10-CM

## 2021-09-22 DIAGNOSIS — I10 Essential (primary) hypertension: Secondary | ICD-10-CM | POA: Diagnosis not present

## 2021-09-22 LAB — CBC
HCT: 30.3 % — ABNORMAL LOW (ref 39.0–52.0)
Hemoglobin: 9.9 g/dL — ABNORMAL LOW (ref 13.0–17.0)
MCH: 29.6 pg (ref 26.0–34.0)
MCHC: 32.7 g/dL (ref 30.0–36.0)
MCV: 90.7 fL (ref 80.0–100.0)
Platelets: 136 10*3/uL — ABNORMAL LOW (ref 150–400)
RBC: 3.34 MIL/uL — ABNORMAL LOW (ref 4.22–5.81)
RDW: 15.3 % (ref 11.5–15.5)
WBC: 12.3 10*3/uL — ABNORMAL HIGH (ref 4.0–10.5)
nRBC: 0 % (ref 0.0–0.2)

## 2021-09-22 LAB — BASIC METABOLIC PANEL
Anion gap: 6 (ref 5–15)
BUN: 40 mg/dL — ABNORMAL HIGH (ref 6–20)
CO2: 29 mmol/L (ref 22–32)
Calcium: 8.7 mg/dL — ABNORMAL LOW (ref 8.9–10.3)
Chloride: 118 mmol/L — ABNORMAL HIGH (ref 98–111)
Creatinine, Ser: 1.2 mg/dL (ref 0.61–1.24)
GFR, Estimated: >60 mL/min (ref >60)
Glucose, Bld: 183 mg/dL — ABNORMAL HIGH (ref 70–99)
Potassium: 3.3 mmol/L — ABNORMAL LOW (ref 3.5–5.1)
Sodium: 153 mmol/L — ABNORMAL HIGH (ref 135–145)

## 2021-09-22 LAB — GLUCOSE, CAPILLARY
Glucose-Capillary: 105 mg/dL — ABNORMAL HIGH (ref 70–99)
Glucose-Capillary: 125 mg/dL — ABNORMAL HIGH (ref 70–99)
Glucose-Capillary: 154 mg/dL — ABNORMAL HIGH (ref 70–99)
Glucose-Capillary: 154 mg/dL — ABNORMAL HIGH (ref 70–99)
Glucose-Capillary: 72 mg/dL (ref 70–99)
Glucose-Capillary: 80 mg/dL (ref 70–99)

## 2021-09-22 IMAGING — DX DG CHEST 1V PORT
1 series · 1 of 1 positions shown · non-contrast
Comparison: Chest x-ray from same day at [A2] hours.

CLINICAL DATA: Status post tracheostomy.

EXAM:
PORTABLE CHEST 1 VIEW

[chest]
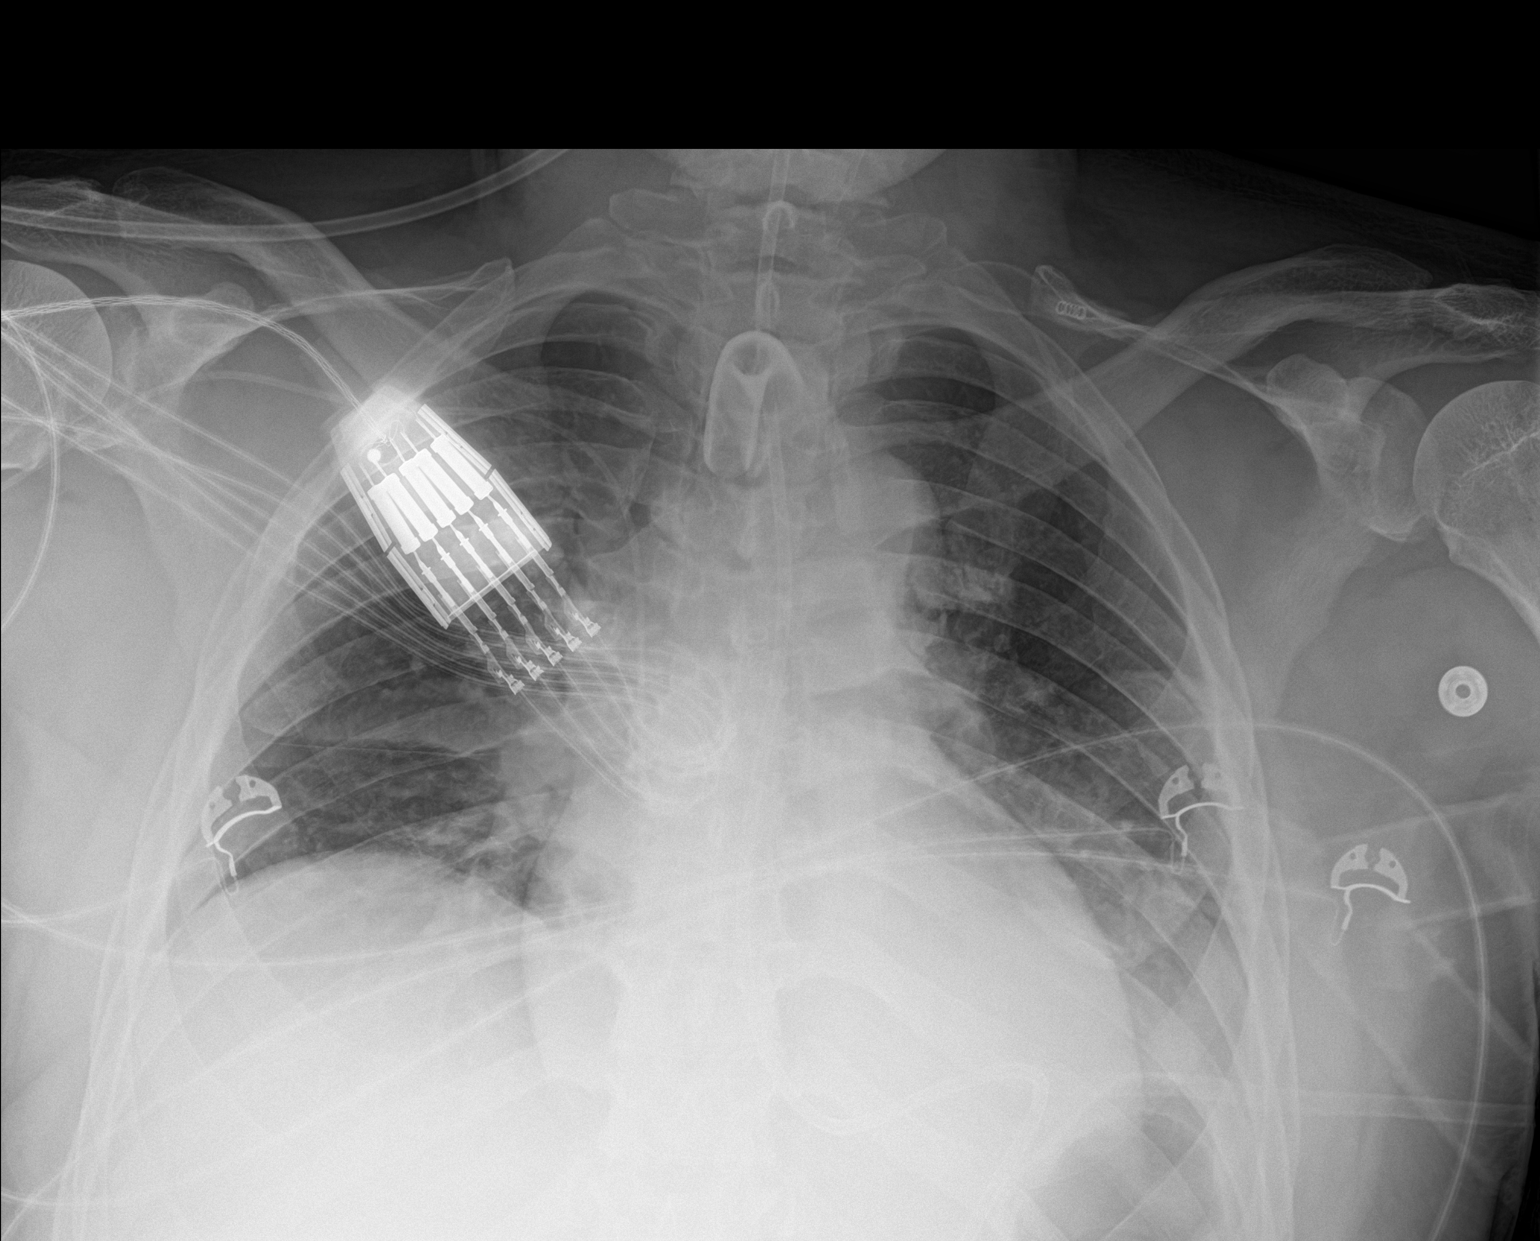

[1 of 1 positions shown; findings below may reference images not displayed]

FINDINGS: New tracheostomy tube with tip at the thoracic inlet between the
clavicular heads. Unchanged feeding tube entering the stomach with
the tip below the field of view. Stable cardiomediastinal
silhouette. Continued low lung volumes. Worsening aeration of the
left lower lobe with increasing retrocardiac opacity and
silhouetting of the left hemidiaphragm. Unchanged mild atelectasis
in the medial right lung base. No pleural effusion or pneumothorax.
No acute osseous abnormality.
IMPRESSION: 1. New tracheostomy tube with tip at the thoracic inlet.
2. Worsening left lower lobe atelectasis versus pneumonia.

## 2021-09-22 IMAGING — DX DG CHEST 1V PORT
1 series · 1 of 1 positions shown · non-contrast
Comparison: Radiographs [DATE] and [DATE].

CLINICAL DATA: Hypoxia.

EXAM:
PORTABLE CHEST 1 VIEW

[chest]
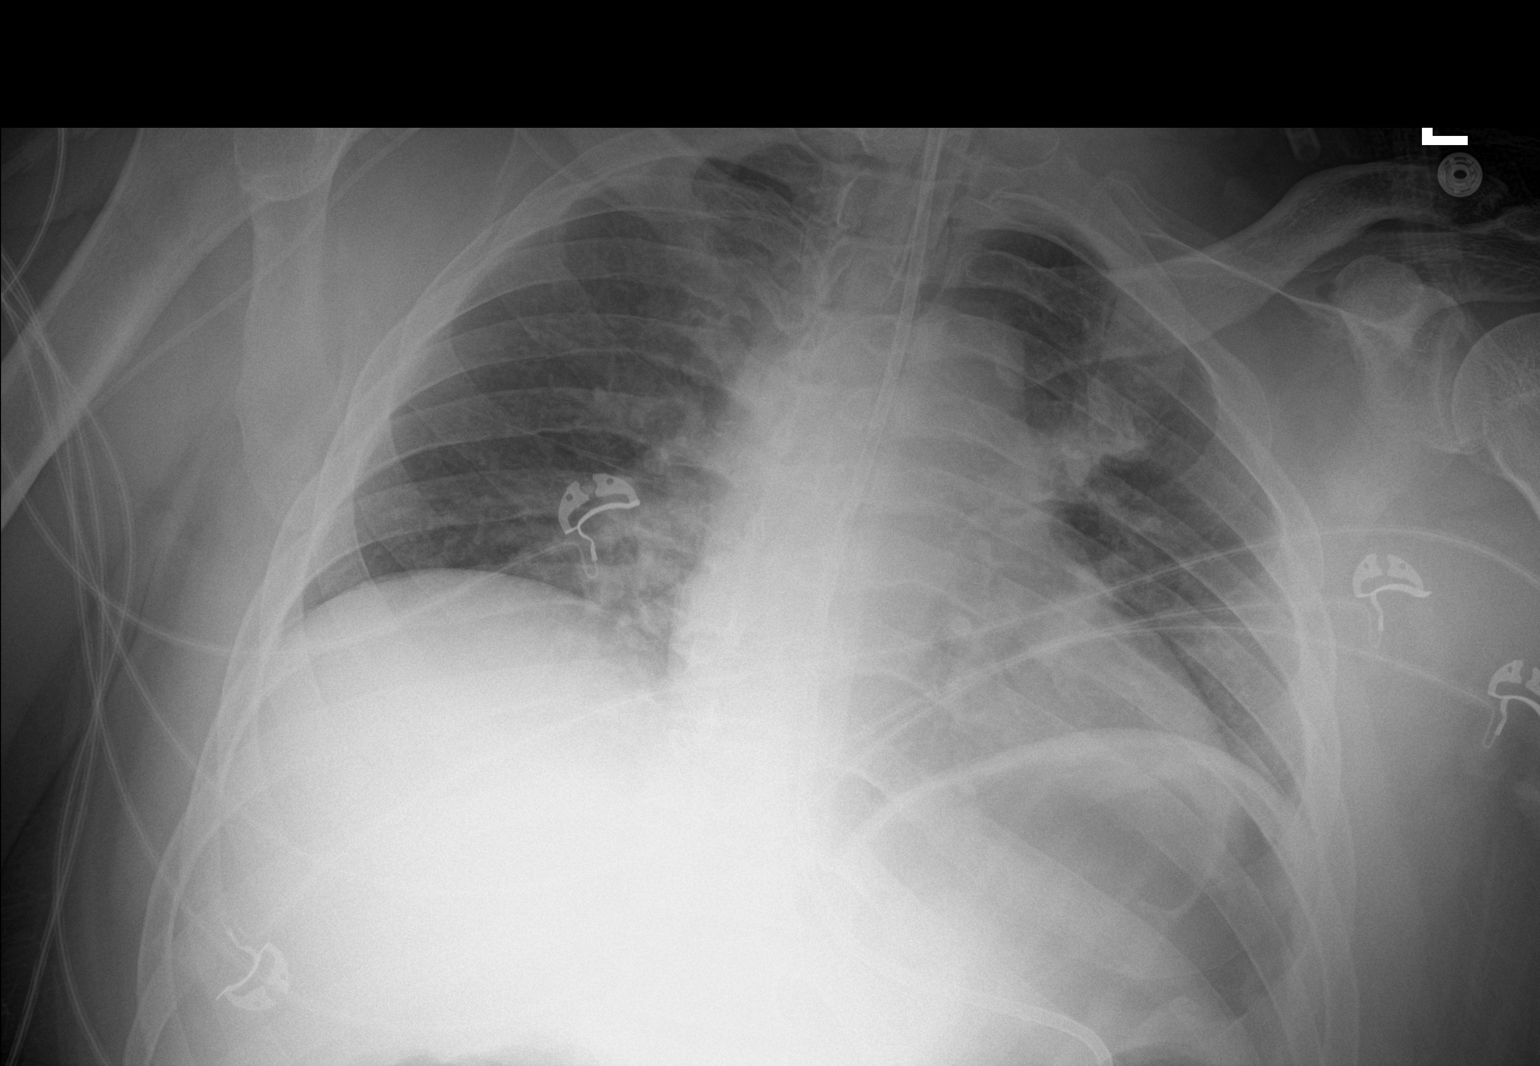

[1 of 1 positions shown; findings below may reference images not displayed]

FINDINGS: [4O] hours. Endotracheal tube terminates at the level of the
clavicular heads. Feeding tube projects below the diaphragm, tip not
visualized. There are persistent low lung volumes with unchanged
patchy bibasilar and perihilar opacities bilaterally. No
pneumothorax or significant pleural effusion identified. The heart
size and mediastinal contours are stable. The bones appear
unremarkable. Telemetry leads overlie the chest.
IMPRESSION: Stable radiographic appearance of the chest with patchy bibasilar
and perihilar pulmonary opacities. Unchanged position of the
endotracheal and feeding tubes.

## 2021-09-22 IMAGING — DX DG ABDOMEN 1V
1 series · 3 of 3 positions shown · non-contrast
Comparison: [DATE].

CLINICAL DATA: Ileus.

EXAM:
ABDOMEN - 1 VIEW

[Series 1: abdomen · 0.14mm/px · 3 of 3 slices shown]
[im 1/3]
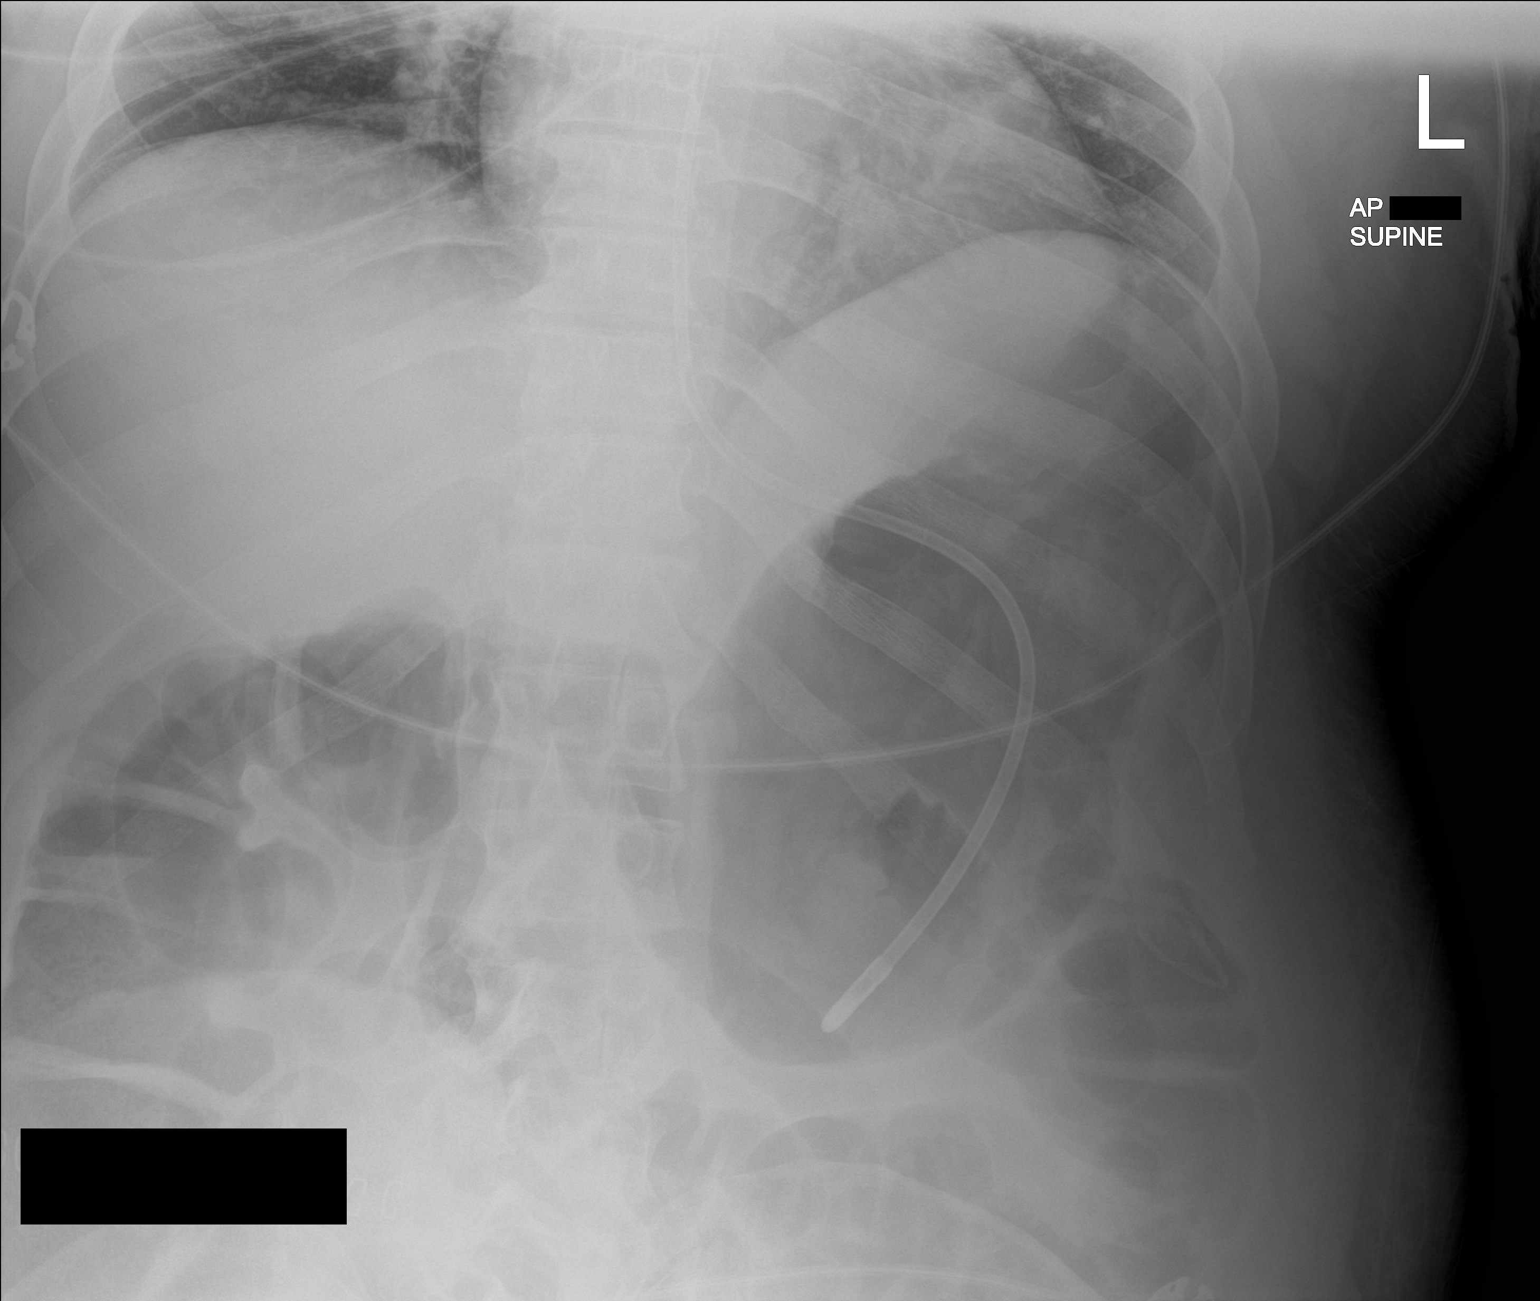
[im 2/3]
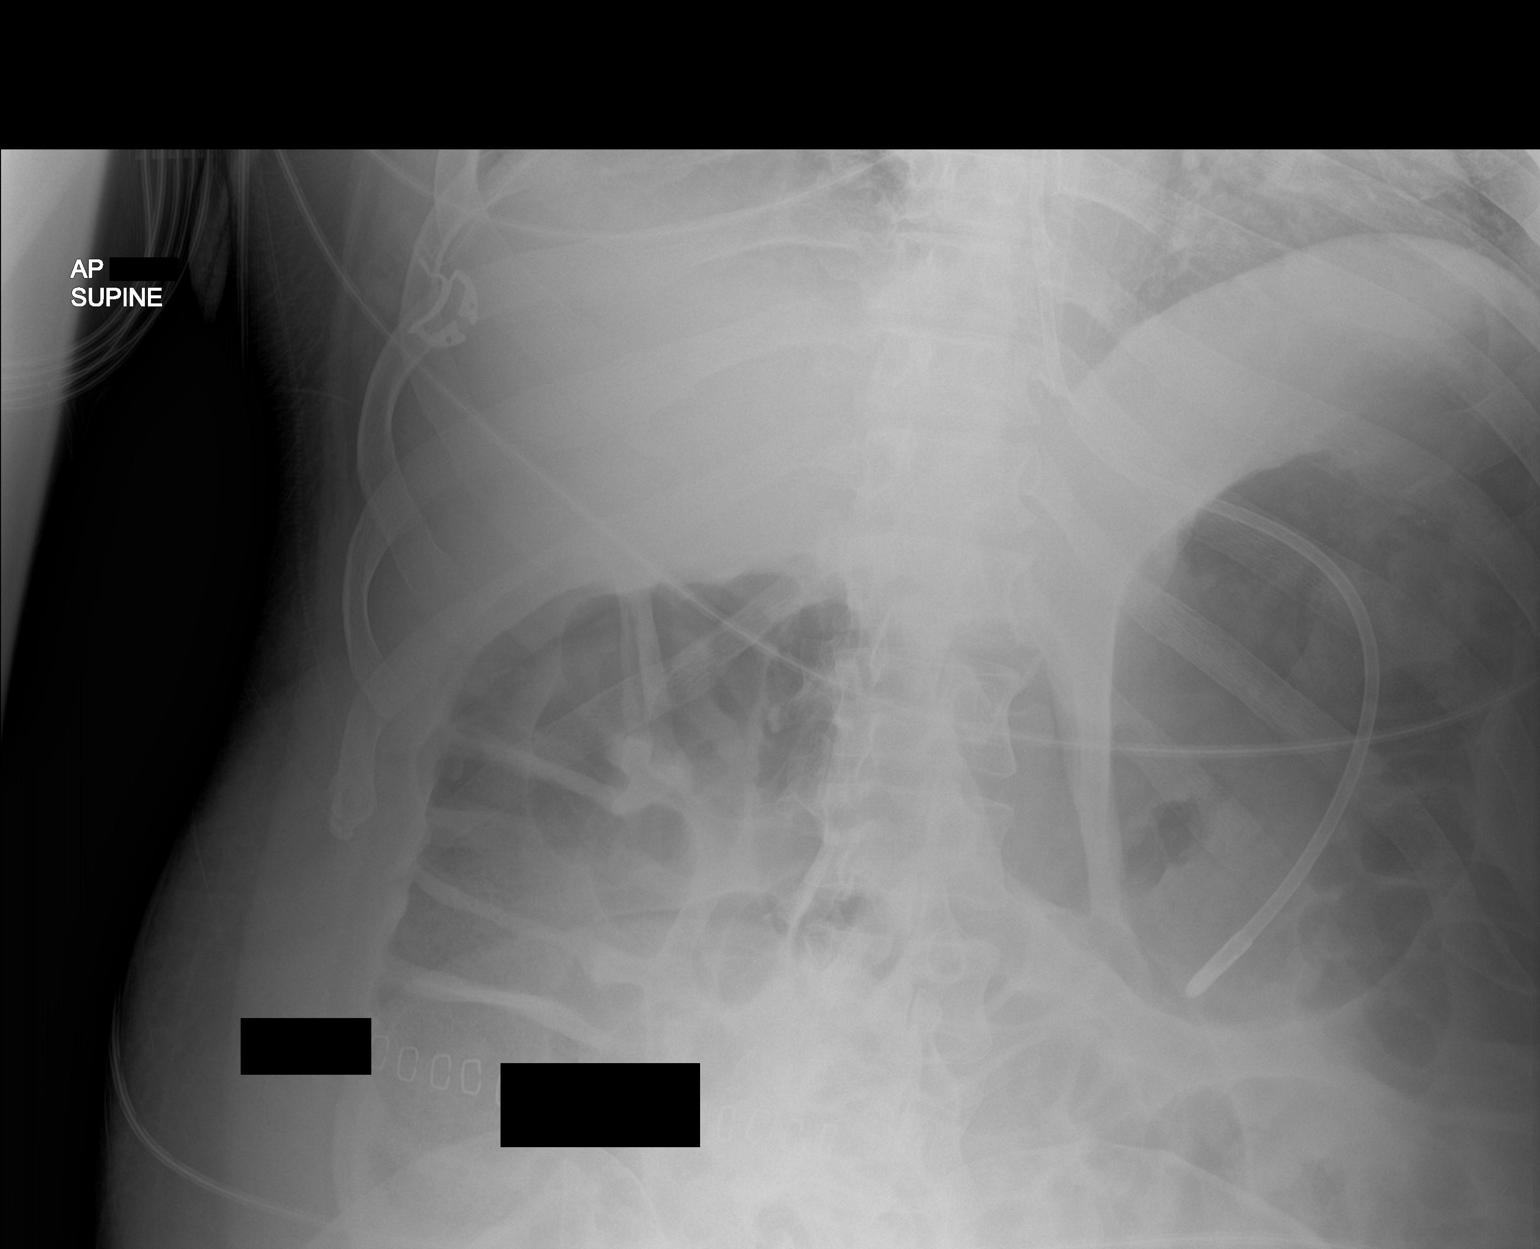
[im 3/3]
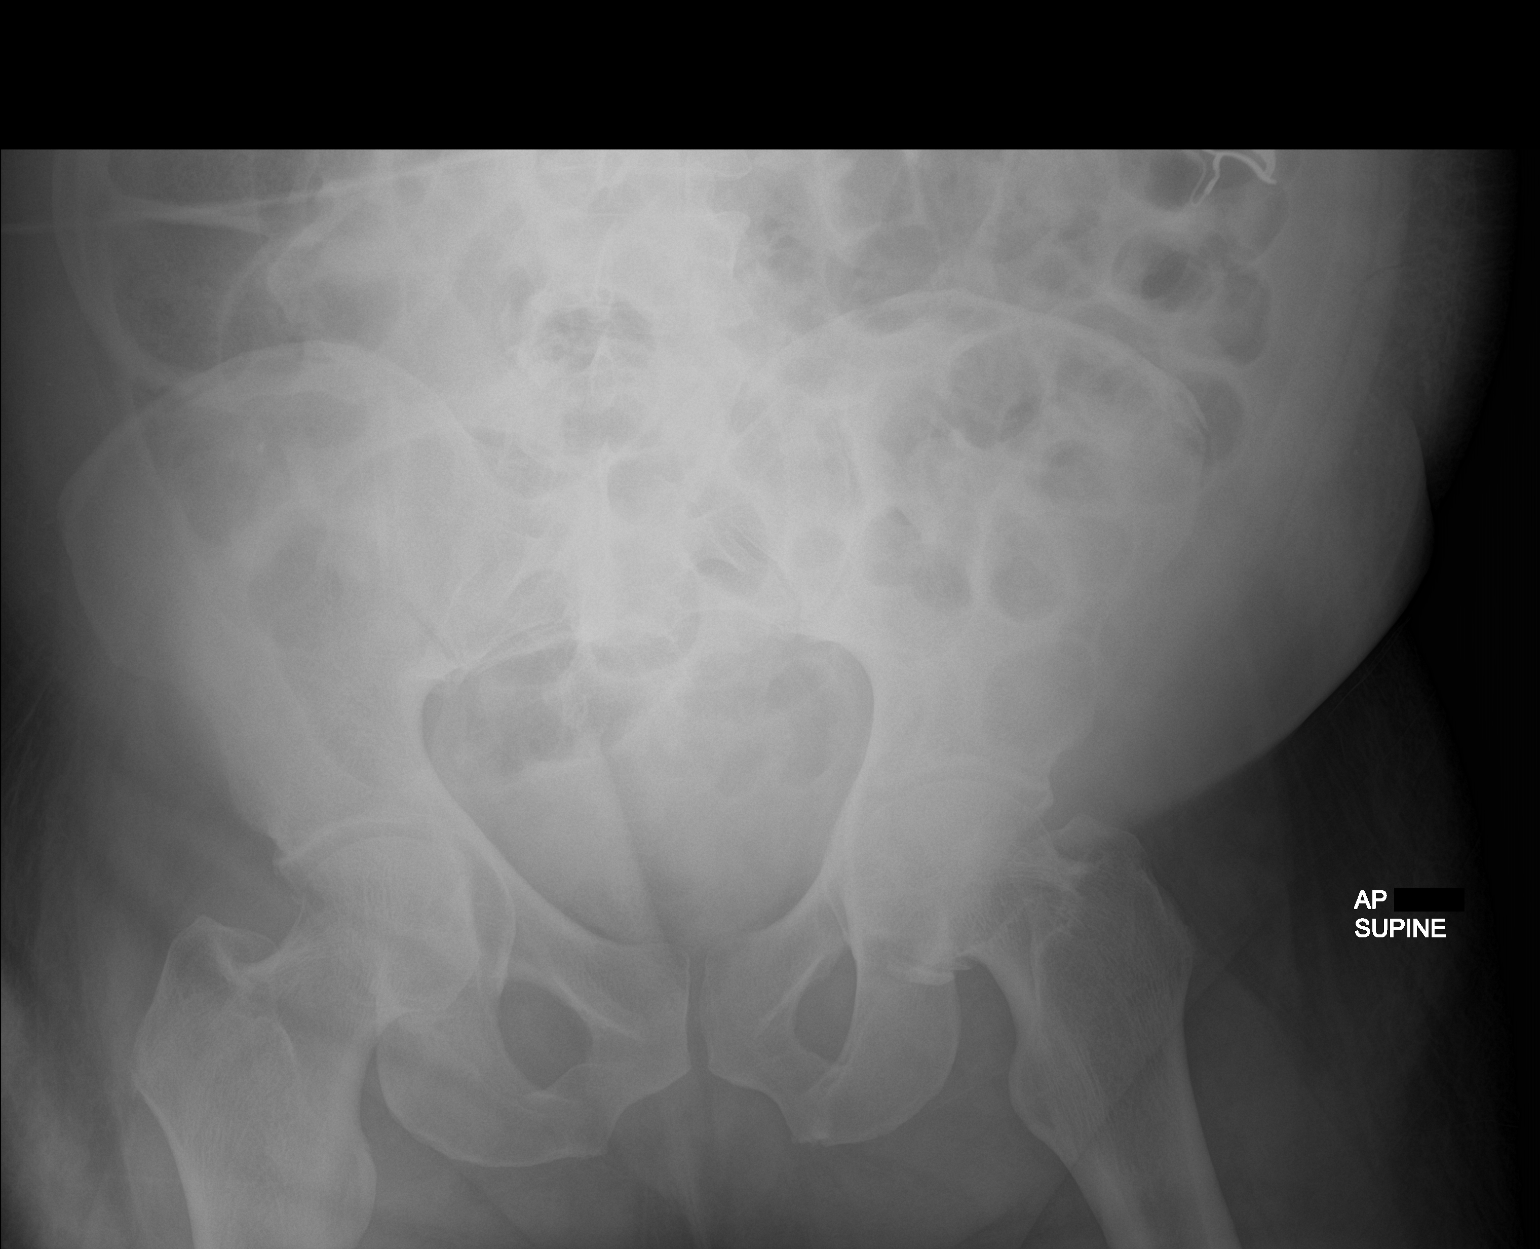

[3 of 3 positions shown; findings below may reference images not displayed]

FINDINGS: Feeding tube tip in stomach as on the prior study. Stomach
moderately distended. Surgical staples over the RIGHT lower
quadrant.

Diffuse gaseous distension of both large and small bowel. Gas in the
area of the rectum.

On limited assessment no acute regional skeletal process.
IMPRESSION: Findings most suggestive of global ileus. Would however suggest
follow-up as the ascending colon shows dilation nearing 7 cm.
Pattern bowel dilation is similar to recent imaging. Is

## 2021-09-22 IMAGING — DX DG ABD PORTABLE 1V
1 series · 1 of 1 positions shown · non-contrast
Comparison: [DATE] [DATE], [DATE] [DATE] a.m.

CLINICAL DATA: NG tube placement

EXAM:
PORTABLE ABDOMEN - 1 VIEW

[abdomen]
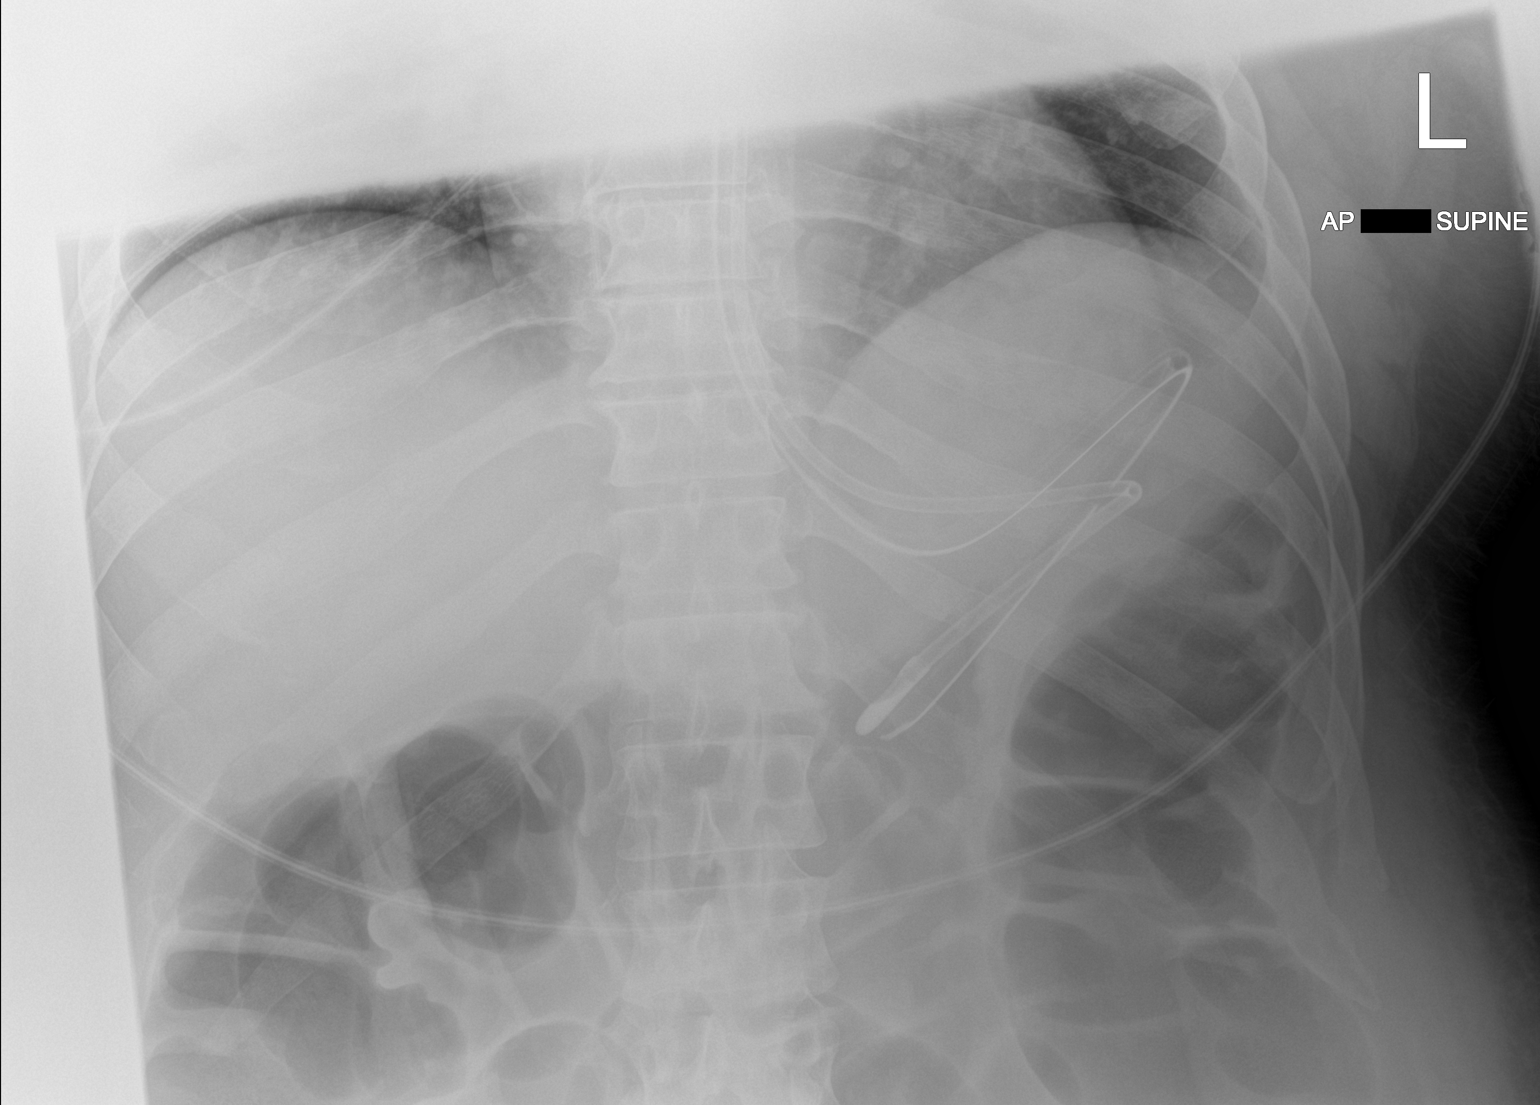

[1 of 1 positions shown; findings below may reference images not displayed]

FINDINGS: The previously noted feeding tube is unchanged in the mid stomach.
There is interval placement of a nasogastric tube with distal tip in
the mid stomach. Bowel gas pattern is unchanged compared to prior
exam.
IMPRESSION: Nasogastric tube with distal tip in the mid stomach.

## 2021-09-22 MED ORDER — POTASSIUM CHLORIDE 20 MEQ PO PACK
40.0000 meq | PACK | Freq: Once | ORAL | Status: AC
  Administered 2021-09-22: 40 meq
  Filled 2021-09-22: qty 2

## 2021-09-22 MED ORDER — FENTANYL CITRATE PF 50 MCG/ML IJ SOSY
200.0000 ug | PREFILLED_SYRINGE | Freq: Once | INTRAMUSCULAR | Status: AC
  Administered 2021-09-22: 200 ug via INTRAVENOUS
  Filled 2021-09-22: qty 4

## 2021-09-22 MED ORDER — ROCURONIUM BROMIDE 50 MG/5ML IV SOLN
100.0000 mg | Freq: Once | INTRAVENOUS | Status: AC
  Administered 2021-09-22: 100 mg via INTRAVENOUS
  Filled 2021-09-22: qty 10

## 2021-09-22 MED ORDER — ETOMIDATE 2 MG/ML IV SOLN
20.0000 mg | Freq: Once | INTRAVENOUS | Status: AC
  Administered 2021-09-22: 20 mg via INTRAVENOUS
  Filled 2021-09-22: qty 10

## 2021-09-22 MED ORDER — FUROSEMIDE 10 MG/ML IJ SOLN
40.0000 mg | Freq: Two times a day (BID) | INTRAMUSCULAR | Status: DC
  Administered 2021-09-22 – 2021-09-23 (×2): 40 mg via INTRAVENOUS
  Filled 2021-09-22 (×2): qty 4

## 2021-09-22 MED ORDER — INSULIN DETEMIR 100 UNIT/ML ~~LOC~~ SOLN
20.0000 [IU] | Freq: Two times a day (BID) | SUBCUTANEOUS | Status: DC
  Administered 2021-09-22 – 2021-09-23 (×2): 20 [IU] via SUBCUTANEOUS
  Filled 2021-09-22 (×3): qty 0.2

## 2021-09-22 MED ORDER — ROCURONIUM BROMIDE 10 MG/ML (PF) SYRINGE
PREFILLED_SYRINGE | INTRAVENOUS | Status: AC
  Filled 2021-09-22: qty 10

## 2021-09-22 MED ORDER — FENTANYL CITRATE PF 50 MCG/ML IJ SOSY
12.5000 ug | PREFILLED_SYRINGE | INTRAMUSCULAR | Status: DC | PRN
  Administered 2021-09-23 – 2021-10-01 (×6): 25 ug via INTRAVENOUS
  Filled 2021-09-22 (×7): qty 1

## 2021-09-22 MED ORDER — MIDAZOLAM HCL 2 MG/2ML IJ SOLN
5.0000 mg | Freq: Once | INTRAMUSCULAR | Status: AC
  Administered 2021-09-22: 5 mg via INTRAVENOUS
  Filled 2021-09-22: qty 6

## 2021-09-22 MED ORDER — POTASSIUM CHLORIDE 10 MEQ/100ML IV SOLN
10.0000 meq | INTRAVENOUS | Status: AC
  Administered 2021-09-22 (×2): 10 meq via INTRAVENOUS
  Filled 2021-09-22 (×2): qty 100

## 2021-09-22 NOTE — Progress Notes (Signed)
SLP Cancellation Note ? ?Patient Details ?Name: Jacob [name] ?MRN: [name] ?DOB: 1976/01/15 ? ? ?Cancelled treatment:       Reason Eval/Treat Not Completed: Other (comment) Patient with new tracheostomy today. Orders for SLP eval and treat for PMSV and swallowing received. Will follow pt closely for readiness for SLP interventions as appropriate.   ? ? ? ?Osie Bond., M.A. CCC-SLP ?Acute Rehabilitation Services ?Office [phone] ? ?Secure chat preferred ? ?09/22/2021, 4:02 PM ?

## 2021-09-22 NOTE — Procedures (Signed)
Percutaneous Tracheostomy Procedure Note ? ? ?Jacob [name]  ?[id number]  ?20-Dec-1975 ? ?Date:09/22/21  ?Time:2:42 PM  ? ?Provider Performing:Reshanda Lewey C Tamala Julian ? ?Procedure: Percutaneous Tracheostomy with Bronchoscopic Guidance ([id number]) ? ?Indication(s) ?Persistent resp failure ? ?Consent ?Risks of the procedure as well as the alternatives and risks of each were explained to the patient and/or caregiver.  Consent for the procedure was obtained. ? ?Anesthesia ?Etomidate, Versed, Fentanyl, Vecuronium ? ? ?Time Out ?Verified patient identification, verified procedure, site/side was marked, verified correct patient position, special equipment/implants available, medications/allergies/relevant history reviewed, required imaging and test results available. ? ? ?Sterile Technique ?Maximal sterile technique including sterile barrier drape, hand hygiene, sterile gown, sterile gloves, mask, hair covering. ? ? ? ?Procedure Description ?Appropriate anatomy identified by palpation.  Patient's neck prepped and draped in sterile fashion.  1% lidocaine with epinephrine was used to anesthetize skin overlying neck.  1.5cm incision made and blunt dissection performed until tracheal rings could be easily palpated.   Then a size 8-0 Shiley tracheostomy was placed under bronchoscopic visualization using usual Seldinger technique and serial dilation.   Bronchoscope confirmed placement above the carina.  Tracheostomy was sutured in place with adhesive pad to protect skin under pressure.    Patient connected to ventilator. ? ? ?Complications/Tolerance ?None; patient tolerated the procedure well. ?Chest X-ray is ordered to confirm no post-procedural complication. ? ? ?EBL ?Minimal ? ? ?Specimen(s) ?None  ? ?

## 2021-09-22 NOTE — Procedures (Deleted)
Tracheostomy Change Note ? ?Patient Details:   ?Name: Jacob [name] ?DOB: 03/22/1976 ?MRN: [name] ?   ?Airway Documentation: ?   ? ?Evaluation ? O2 sats: stable throughout ?Complications: No apparent complications ?Patient did tolerate procedure well. ?Bilateral Breath Sounds: Clear, Diminished, rhonchi  ?Assisted with tracheotomy #8.0 placement ?  ? ?Revonda Standard ?09/22/2021, 2:48 PM ?

## 2021-09-22 NOTE — Procedures (Signed)
Bedside Tracheostomy Insertion Procedure Note ? ? ?Patient Details:   ?Name: HJALMER DANOWSKI ?DOB: 12/17/1975 ?MRN: [name] ? ?Procedure: Tracheostomy ? ?Pre Procedure Assessment: ?ET Tube Size:7.5 ?ET Tube secured at lip (cm):26 ?Bite block in place: Yes ?Breath Sounds: Diminished and Rhonch ? ?Post Procedure Assessment: ?BP (!) 152/97   Pulse 84   Temp (!) 102.1 ?F (38.9 ?C) (Axillary) Comment: tylenol and ice  Resp (!) 22   Ht 6\' 1"  (1.854 m)   Wt 110 kg   SpO2 100%   BMI 31.99 kg/m?  ?O2 sats: stable throughout ?Complications: No apparent complications ?Patient did tolerate procedure well ?Tracheostomy Brand:Shiley ?Tracheostomy Style:Cuffed ?Tracheostomy Size:  8.0 ?Tracheostomy Secured [id number] ?Tracheostomy Placement Confirmation:Trach cuff visualized and in place ? ? ? ?Revonda Standard ?09/22/2021, 2:50 PM ? ?    ? ? ?

## 2021-09-22 NOTE — IPAL (Signed)
?  Interdisciplinary Goals of Care Family Meeting ? ? ?Date carried out:: 09/22/2021 ? ?Location of the meeting: Bedside ? ?Member's involved: Physician, Bedside Registered Nurse, and Family Member or next of kin ? ?Durable Power of Tour manager: wife   ? ?Discussion: We discussed goals of care for [employer] .  Making improvements slowly, planning on continuing full supportive care measures for hopeful maximal return to function. He will have a long recovery and need for rehabilitation. Planning for trach this afternoon after speaking with RT, RN, Neuro, other CCM physicians involved in his care, and his wife. ? ?Code status: Full Code ? ?Disposition: Continue current acute care ? ? ?Time spent for the meeting: 15 min. ? ?Julian Hy ?09/22/2021, 10:42 AM ? ?

## 2021-09-22 NOTE — Progress Notes (Signed)
RT NOTE: ETT advanced 3cm to 27 @ lips per CCM order.  ?

## 2021-09-22 NOTE — Progress Notes (Signed)
? ?Providing Compassionate, Quality Care - Together ? ? ?Subjective: ?Patient intubated and sedated. Nurse reports trach placement scheduled for this afternoon. ? ?Objective: ?Vital signs in last 24 hours: ?Temp:  [100 ?F (37.8 ?C)-102.1 ?F (38.9 ?C)] 102.1 ?F (38.9 ?C) (05/01 1132) ?Pulse Rate:  [69-102] 85 (05/01 1200) ?Resp:  [14-28] 26 (05/01 1200) ?BP: (108-168)/(65-97) 150/95 (05/01 1200) ?SpO2:  [93 %-100 %] 98 % (05/01 1200) ?FiO2 (%):  [30 %-40 %] 30 % (05/01 1132) ? ?Intake/Output from previous day: ?04/30 0701 - 05/01 0700 ?In: 2124.3 [I.V.:58.3; NG/GT:2066] ?Out: 2100 [Urine:2100] ?Intake/Output this shift: ?Total I/O ?In: 380 [NG/GT:380] ?Out: 705 [Urine:705] ? ?Responds to voice ?Intubated ?PERRLA, 3 round sluggish ?Right gaze preference ?Follows commands RUE, RLE ?No movement in LUE, LLE ?Staples in place. Flap moderately tense. ? ?Lab Results: ?Recent Labs  ?  09/21/21 ?0206 09/22/21 ?0500  ?WBC 11.7* 12.3*  ?HGB 10.2* 9.9*  ?HCT 30.4* 30.3*  ?PLT 133* 136*  ? ?BMET ?Recent Labs  ?  09/21/21 ?0206 09/22/21 ?0500  ?NA 156* 153*  ?K 3.8 3.3*  ?CL 123* 118*  ?CO2 26 29  ?GLUCOSE 178* 183*  ?BUN 41* 40*  ?CREATININE 1.24 1.20  ?CALCIUM 8.6* 8.7*  ? ? ?Studies/Results: ?DG Abd 1 View ? ?Result Date: 09/22/2021 ?CLINICAL DATA:  Ileus. EXAM: ABDOMEN - 1 VIEW COMPARISON:  September 17, 2021. FINDINGS: Feeding tube tip in stomach as on the prior study. Stomach moderately distended. Surgical staples over the RIGHT lower quadrant. Diffuse gaseous distension of both large and small bowel. Gas in the area of the rectum. On limited assessment no acute regional skeletal process. IMPRESSION: Findings most suggestive of global ileus. Would however suggest follow-up as the ascending colon shows dilation nearing 7 cm. Pattern bowel dilation is similar to recent imaging. Is Electronically Signed   By: Zetta Bills M.D.   On: 09/22/2021 10:04  ? ?DG CHEST PORT 1 VIEW ? ?Result Date: 09/22/2021 ?CLINICAL DATA:  Hypoxia. EXAM:  PORTABLE CHEST 1 VIEW COMPARISON:  Radiographs 09/20/2021 and 09/17/2021. FINDINGS: 0919 hours. Endotracheal tube terminates at the level of the clavicular heads. Feeding tube projects below the diaphragm, tip not visualized. There are persistent low lung volumes with unchanged patchy bibasilar and perihilar opacities bilaterally. No pneumothorax or significant pleural effusion identified. The heart size and mediastinal contours are stable. The bones appear unremarkable. Telemetry leads overlie the chest. IMPRESSION: Stable radiographic appearance of the chest with patchy bibasilar and perihilar pulmonary opacities. Unchanged position of the endotracheal and feeding tubes. Electronically Signed   By: Richardean Sale M.D.   On: 09/22/2021 09:49  ? ?DG Abd Portable 1V ? ?Result Date: 09/22/2021 ?CLINICAL DATA:  NG tube placement EXAM: PORTABLE ABDOMEN - 1 VIEW COMPARISON:  Sep 22, 2021 9:46 a.m. FINDINGS: The previously noted feeding tube is unchanged in the mid stomach. There is interval placement of a nasogastric tube with distal tip in the mid stomach. Bowel gas pattern is unchanged compared to prior exam. IMPRESSION: Nasogastric tube with distal tip in the mid stomach. Electronically Signed   By: Abelardo Diesel M.D.   On: 09/22/2021 11:03  ? ?EEG adult ? ?Result Date: 09/21/2021 ?Derek Jack, MD     09/21/2021  8:38 PM Routine EEG Report PILOT ENGLES is a 46 y.o. male with a history of right MCA stroke s/p hemicraniectomy who is undergoing an EEG to evaluate for seizures. Report: This EEG was acquired with electrodes placed according to the International 10-20 electrode system (including  Fp1, Fp2, F3, F4, C3, C4, P3, P4, O1, O2, T3, T4, T5, T6, A1, A2, Fz, Cz, Pz). The following electrodes were missing or displaced: Fz/Cz/Pz displaced 2/2 large central surgical incision. The best background was 5-7 Hz. This activity is reactive to stimulation. No clear waking rhythm or sleep architecture was identified. There was  focal slowing over the right hemisphere. There were no interictal epileptiform discharges. There were no electrographic seizures identified. Photic stimulation and hyperventilation were not performed. Impression and clinical correlation: This EEG was obtained while intubated and sedated on fentanyl and is abnormal due to: - Mild diffuse slowing indicative of global cerebral dysfunction - Right focal slowing indicative of superimposed focal cerebral dysfunction in that region Epileptiform abnormalities were not seen during this recording. Su Monks, MD [facility] [phone] If 7pm- 7am, please page neurology on call as listed in [place].   ? ?Assessment/Plan: ?Patient suffered large right ischemic MCA stroke, resulting in significant cerebral edema on 09/12/2021. Dr. Arnoldo Morale performed a right hemicraniectomy on 09/15/2021 for decompression. Patient is following commands on the right. Trach planned for this afternoon. Staples can be removed on post op day 10. ? ? ? LOS: 10 days  ? ? ? ?Viona Gilmore, DNP, AGNP-C ?Nurse Practitioner ? ?[facility] ?[address], [address], [place], [place] [address] ?[id number]    [id number] ? ?09/22/2021, 12:42 PM ? ? ? ? ?

## 2021-09-22 NOTE — Progress Notes (Addendum)
STROKE TEAM PROGRESS NOTE  ? ?INTERVAL HISTORY ?Patient is seen in his room with his wife at the bedside.  Na decreased to 153.  He has been hemodynamically stable and his neurological exam is stable. Opens eyes to noxious stimuli and is currently following commands.  Continues to have a right gaze preference with a dense left hemiplegia.  Abdomen is distended, KUB ordered and it shows a global ileus.  CCM to insert orogastric tube for decompression. ? ? ?Vitals:  ? 09/22/21 0755 09/22/21 0800 09/22/21 0904 09/22/21 0905  ?BP:  (!) 161/97    ?Pulse:  82    ?Resp:  18    ?Temp:  (!) 101.3 ?F (38.5 ?C)    ?TempSrc:  Axillary    ?SpO2: 100% 98% 95% 96%  ?Weight:      ?Height:      ? ?CBC:  ?Recent Labs  ?Lab 09/21/21 ?0206 09/22/21 ?0500  ?WBC 11.7* 12.3*  ?HGB 10.2* 9.9*  ?HCT 30.4* 30.3*  ?MCV 88.6 90.7  ?PLT 133* 136*  ? ? ?Basic Metabolic Panel:  ?Recent Labs  ?Lab 09/17/21 ?1758 09/17/21 ?2351 09/20/21 ?1043 09/21/21 ?0206 09/22/21 ?0500  ?NA 155*   < >  --  156* 153*  ?K  --    < >  --  3.8 3.3*  ?CL  --    < >  --  123* 118*  ?CO2  --    < >  --  26 29  ?GLUCOSE  --    < >  --  178* 183*  ?BUN  --    < >  --  41* 40*  ?CREATININE  --    < >  --  1.24 1.20  ?CALCIUM  --    < >  --  8.6* 8.7*  ?MG 2.5*  --  2.6*  --   --   ?PHOS 2.5  --  3.1  --   --   ? < > = values in this interval not displayed.  ? ? ?Lipid Panel:  ?Recent Labs  ?Lab 09/18/21 ?2143  ?TRIG 212*  ? ? ?HgbA1c:  ?No results for input(s): HGBA1C in the last 168 hours. ? ?Urine Drug Screen:  ?No results for input(s): LABOPIA, COCAINSCRNUR, LABBENZ, AMPHETMU, THCU, LABBARB in the last 168 hours. ?  ?Alcohol Level No results for input(s): ETH in the last 168 hours. ? ?IMAGING past 24 hours ?EEG adult ? ?Result Date: 09/21/2021 ?Derek Jack, MD     09/21/2021  8:38 PM Routine EEG Report VENARD LESCARBEAU is a 46 y.o. male with a history of right MCA stroke s/p hemicraniectomy who is undergoing an EEG to evaluate for seizures. Report: This EEG was  acquired with electrodes placed according to the International 10-20 electrode system (including Fp1, Fp2, F3, F4, C3, C4, P3, P4, O1, O2, T3, T4, T5, T6, A1, A2, Fz, Cz, Pz). The following electrodes were missing or displaced: Fz/Cz/Pz displaced 2/2 large central surgical incision. The best background was 5-7 Hz. This activity is reactive to stimulation. No clear waking rhythm or sleep architecture was identified. There was focal slowing over the right hemisphere. There were no interictal epileptiform discharges. There were no electrographic seizures identified. Photic stimulation and hyperventilation were not performed. Impression and clinical correlation: This EEG was obtained while intubated and sedated on fentanyl and is abnormal due to: - Mild diffuse slowing indicative of global cerebral dysfunction - Right focal slowing indicative of superimposed focal cerebral dysfunction in that  region Epileptiform abnormalities were not seen during this recording. Su Monks, MD [facility] [phone] If 7pm- 7am, please page neurology on call as listed in [place].   ? ?PHYSICAL EXAM ? ?Physical Exam  ?Constitutional: Appears well-developed and well-nourished middle-age is sedated and intubated African-American male, intubated hemi craniectomy surgical incision with clips seen on the right side ?Cardiovascular: Normal rate and regular rhythm.  ?Respiratory: Respirations synchronous with ventilator ? ?Neuro (intubated and on sedation ): Right gaze deviation, blink to threat. Corneal intact bilateral.   Right eye is swollen and difficult to open.  Following commands on the right.  PERRL,  oculocephalic reflex, cough and gag reflexes present.  ? ?ASSESSMENT/PLAN ?Mr. JAMICHAEL TAJ is a 46 y.o. male with history of HTN, DM2 presenting with left side facial droop, left arm weakness. Received TNKase. Neuro status declined during MRI (NIHSS 13),taken for thrombectomy with Dr. Karenann Cai. TICI3  recanalization of the right M2/MCA achieved.  MRI/MRA shows reoccluded right M2 branch with a right MCA territory infarct.  Continue with PT/OT/SLP evals.  Home blood pressure medications resumed. BP goal less than 160. Topamax and Tylenol #3 for headache management.  Head CT shows new 22mm leftward midline shift. Trasnferred back to ICU, decompressive hemicraniectomy performed by neurosurgery.  Follow-up CT shows 5 mm left midline shift.  Now intubated.  HTS d/c'd due to Na of 160. ? ?Stroke:  Right MCA due to right MCA occlusion  s/p TNK and IR with TICI3, etiology not certain, but most likely cryptogenic versus intracranial atherosclerosis. ?Code Stroke CT head No acute abnormality ?CTA head & neck R short segment M1 occlusion, left distal M1 high grade stenosis ?Post IR CT No hemorrhagic complication on flat panel CT. ?4/21- Early MRI Tiny acute infarcts in the upper division right MCA cortex. ?4/22- MRI right MCA territory infarct involving the cortex, sparing the basal ganglia.  2 mm midline shift.  No hemorrhage ?4/22- MRA reoccluded right M2 branch, intracranial atherosclerosis including advanced left M1 stenosis ?4/24-  Head CT-increased edema resulting in near complete effacement of the right lateral ventricle and 12 mm leftward midline shift ?4/25- Head CT Post OP- 5 mm left midline shift, new area of hypoattenuation of the left middle cerebellar peduncle and cerebellum ?4/28 head CT large right MCA infarct with no HT, diminished mass effect with no midline shift, hemicraniectomy noted ?2D Echo EF > 75% ?LDL 96 ?HgbA1c 8.1 ?UDS negative ?VTE prophylaxis - SCDs ?No antithrombotic prior to admission, now on ASA 81.  ?Therapy recommendations: pending ?disposition:  Pending ? ?Cerebral Edema s/p [facility] ?Head CT 4/24 shows increased edema with new 31mm midline shift ?Decompressive hemicraniectomy performed 4/24 by neurosurgery ?Remove staples on postop day 10 ?Na 155->160->159-> 156->153 ?Off HTS->FW ?No midline  shift seen on head CT 4/28 ?On keppra 500mg  bid ?Allow Na gradually trending down ? ?Intracranial stenosis ?CTA head & neck R short segment M1 occlusion, left distal M1 high grade stenosis ?No significant extracranial stenosis ?Most likely related to long standing uncontrolled risk factors. ? ?Hypertension ?Home meds:  Amlodipine 10mg , lisinopril 10mg -resumed ?Stable ?BP goal <160  ?On coreg 25, amlodipine 10, clonidine 0.2 Q8, lisinopril 40  ?PRN labetalol and hydralazine ?Long-term BP goal normotensive ? ?Hyperlipidemia ?Home meds:  Atorvastatin 20mg  ?LDL 96, goal < 70 ?Put on lipitor 40 ?Continue statin at discharge ? ?Diabetes type II Uncontrolled ?Home meds:  Glipizide, metformin ?HgbA1c 8.1, goal < 7.0 ?CBGs ?SSI with resist scale, Levemir 11->40 units twice daily ?DM coordinator consulted ? ?  Other Stroke Risk Factors ?Obesity, Body mass index is 31.99 kg/m?., BMI >/= 30 associated with increased stroke risk, recommend weight loss, diet and exercise as appropriate  ? ?Dysphagia  ?N.p.o. postop, still intubated ?Cortrak in place ?On TF @ 60 and FW 200 Q4 ? ?Respiratory failure ?Patient left intubated after surgery ?Ventilator management per CCM ?Will likely place tracheostomy 5/1 ? ?Ileus ?Bowel regimen in place with fiber supplement ?Gastric tube for decompression per CCM ? ?Hospital day # 10 ? ?Patient seen and examined by NP/APP with MD. MD to update note as needed.  ? ?Janine Ores, DNP, FNP-BC ?[facility] ?Pager: [phone] ? ?ATTENDING NOTE: ?I reviewed above note and agree with the assessment and plan. Pt was seen and examined.  ? ?Wife at the bedside. Pt still intubated, eyes closed but able to halfway open on voice. Still lethargic, has right gaze preference barely cross midline. PERRL. Doll's eyes absent but b/l corneal present and gag/cough present. Follows commands today on the right hand and foot, able to show finger and wiggle toes. Still has left hemiplegia. Abdomen extended and  tense, KUB continues to show global ileus, management per CCM. Pt mental status seems improved a little but still not feel to be candidate for extubation, will still need trach and PEG. Discussed with Dr. Carlis Abbott. ? ?F

## 2021-09-22 NOTE — Progress Notes (Signed)
OT Cancellation Note ? ?Patient Details ?Name: Jacob [name] ?MRN: [id number] ?DOB: 1975-09-15 ? ? ?Cancelled Treatment:    Reason Eval/Treat Not Completed: Medical issues which prohibited therapy (RN reports pt has a fever and is preparing to go down for a trach. OT to f/u as appropriate.) ? ?[name] ?09/22/2021, 11:53 AM ?

## 2021-09-22 NOTE — Progress Notes (Signed)
? ?NAME:  Jacob [name], MRN:  [id number], DOB:  01-27-1976, LOS: 10 ?ADMISSION DATE:  09/12/2021, CONSULTATION DATE:  09/15/2021 ?REFERRING MD:  Leonie Man - Stroke, CHIEF COMPLAINT:  Cerebral edema.   ? ?History of Present Illness:  ?46 year old man with worsening cerebral edema. ? ?He presented 4/21 with left-sided weakness which was initially waxing and waning. CTA showed right M1 occlusion.  Further decline post TNK brought for thrombectomy at TICI 3 revascularization of right M2. ?Transfer to floor but experienced neurological decline 4/24.  CT showed malignant cerebral edema with 12 mm right to left midline shift and Trapping of the temporal horn. ? ?He was brought back to the ICU and started on 3% hypertonic saline and prepped for the operating room.  Platelets were transfused to offset effect of clopidogrel and aspirin. ? ?Taken to OR and underwent right frontotemporal parietal hemicraniectomy with placement of bone flap in abdominal subcutaneous tissue. Returned to ICU on vent. ? ?Pertinent  Medical History  ? ?Past Medical History:  ?Diagnosis Date  ? Allergy   ? Diabetes mellitus without complication ([place])   ? Hypertension   ? ?Significant Hospital Events: ?Including procedures, antibiotic start and stop dates in addition to other pertinent events   ?4/24 decompressive craniectomy. ?4/25 decreased midline shift on CT ?4/26 still on HT saline.  Switched from prop to precedex. BP meds titrated up in effort to decrease cleviprex. Cortrak placed.  ?4/27 Cleviprex maxed out, started on labetolol gtt. Started scheduled clonidine, also started coreg. Foley replaced and started on doxazosin due to urinary retention. HT infusion cut in half w/ plan to work towards stopping. Stopped fent gtt. Changed to PRN w/ plan to start precedex if needed.  Checking [place] of UE to eval fever. Also sending PCT ? ? ?Interim History / Subjective:  ?This morning he has followed some commands. Has pain in upper abdomen. Tmax 101.3. Diarrhea  overnight.  ? ?Objective   ?Blood pressure (!) 161/97, pulse 82, temperature (!) 101.3 ?F (38.5 ?C), temperature source Axillary, resp. rate 18, height 6\' 1"  (1.854 m), weight 110 kg, SpO2 96 %. ?   ?Vent Mode: PSV;CPAP ?FiO2 (%):  [30 %-40 %] 30 % ?Set Rate:  [18 bmp] 18 bmp ?Vt Set:  [id number] mL] 630 mL ?PEEP:  [5 cmH20] 5 cmH20 ?Pressure Support:  [12 cmH20-15 cmH20] 12 cmH20 ?Plateau Pressure:  [19 cmH20-28 cmH20] 27 cmH20  ? ?Intake/Output Summary (Last 24 hours) at 09/22/2021 0945 ?Last data filed at 09/22/2021 [id number] ?Gross per 24 hour  ?Intake 2364.26 ml  ?Output 2100 ml  ?Net 264.26 ml  ? ? ?Filed Weights  ? 09/17/21 0403 09/20/21 0500 09/21/21 0500  ?Weight: 105.1 kg 109.9 kg 110 kg  ? ? ?Examination: ?General critically ill appearing man lying in bed in NAD  ?HENT Staple line without erythema. ETT in place.  ?Pulm mildly thick tan secretions ?Card S1S2, RRR ?Abd soft, NT ?Ext ++edema, no cyanosis ?GU external catheter ?Neuro nor moving left side, moving R side to follow simple commands. Not opening eyes much, but pointing to abdomen to indicate pain when asked, able to wiggle R toes, straighted R leg when asked to bend knee, able to lift R hand off bed. Not lifting head off pillow. No observed spontaneously swallowing. ? ?Na+  153 ?K+ 3.3 ?BUN 40 ?Cr 1.2 ?WBC 12.3 ?H/H 9.9/30.3 ?Platelets 136 ?No recent cultures ? ?Assessment & Plan:  ?Active Problems: ?  Diabetes mellitus without complication ([place]) ?  Essential hypertension ?  Hyperlipidemia ?  Cerebral edema (H[facility]) ?  Acute respiratory failure with hypoxia ([employer]) ?  Urinary retention ?  Fever ?  Hypernatremia ?  Cerebral embolism with cerebral infarction ? ? ?Acute right MCA stroke (s/p tPA and mechanical thrombectomy) complicated by Cerebral edema requiring decompressive craniectomy on 4/24 ?Follow-up CT 4/28 shows large infarct R MCA, but essentially complete resolution of shift.  ?-con't avoiding sedation ?-neuroprotective measures> HOB >30 degrees, avoid  hypocapnia, maintain normal BG and electrolytes ?-goal SBP <160 ?-bone flap in R abdomen. ?-holding antiplatelet meds for now; NS can recommend when these can safely be resumed. ?-worry that slow improvement in mentation means he will likely require trach and PEG; planning for trach this afternoon. ? ?Poorly controlled HTN ?-con't coreg, clonidine, amlodipine, lisinopril, doxazosin ? ?Acute hypoxic respiratory failure requiring MV ?-LTVV ?-daily SAT & SBT ?-VAP prevention protocol ?-anticipate need for trach for secretion management ? ?Ileus ?-OGT to decompress ?-TF off today ? ?Fluid and electrolyte imbalance: therapeutic hypernatremia, secondary hyperchloremia ?hypokalemia ?-con't low dose FWF ?-lasix BID today ?-K+ repletion this morning and afternoon ?-monitor ? ?DM with hyperglycemia, improved control. ?- Stop TF coverage today, decrease long-acting insulin while TF held ? ?Urinary retention  ?-bladder scan today; if retaining check post-void residual. If still retaining needs UA and culture. ?-con't urecholine and doxazosin ? ?Fevers> not sure if central, has risk factors for infection as well unfortunately. PCT has been low previously. ?-remove PICC ?-trach aspirate culture, but no obvious pneumonia on CXR ?-may need UA and culture as described above if retaining still ? ?Acute anemia and acute thrombocytopenia due to critical illness ?-transfuse for Hb<7 or hemodynamically significant bleeding ?-monitor ? ?AKI, resolved ?-strict I/Os ?-renally dose meds, avoid nephrotoxic meds ? ? ?Best Practice (right click and "Reselect all SmartList Selections" daily)  ? ?Diet/type: tubefeeds- on hold for ileus ?DVT prophylaxis: LMWH ?GI prophylaxis: PPI ?Lines: Central line and No longer needed.  Order written to d/c  ?Foley:  N/A  ?Code Status:  full code ?Last date of multidisciplinary goals of care discussion [Wife updated at bedside 5/1] ? ?This patient is critically ill with multiple organ system failure which  requires frequent high complexity decision making, assessment, support, evaluation, and titration of therapies. This was completed through the application of advanced monitoring technologies and extensive interpretation of multiple databases. During this encounter critical care time was devoted to patient care services described in this note for 46 minutes. ? ?Julian Hy, DO 09/22/21 10:38 AM ?[facility] ? ?

## 2021-09-22 NOTE — Procedures (Signed)
Diagnostic Bronchoscopy ? ?Jacob [name]  ?[name]  ?[birth year] ? ?Date:09/22/21  ?Time:2:40 PM  ? ?Provider Performing:Bless Lisenby Naomie Dean  ? ?Procedure: Diagnostic Bronchoscopy [id number]) ? ?Indication(s) ?Assist with direct visualization of tracheostomy placement ? ?Consent ?Risks of the procedure as well as the alternatives and risks of each were explained to the patient and/or caregiver.  Consent for the procedure was obtained from his wife. ? ? ?Anesthesia ?See separate tracheostomy note ? ? ?Time Out ?Verified patient identification, verified procedure, site/side was marked, verified correct patient position, special equipment/implants available, medications/allergies/relevant history reviewed, required imaging and test results available. ? ? ?Sterile Technique ?Usual hand hygiene, masks, gowns, and gloves were used ? ? ?Procedure Description ?Bronchoscope advanced through endotracheal tube and into airway.  Some mucus plugging RLL. After suctioning out tracheal secretions, bronchoscope used to provide direct visualization of tracheostomy placement. Confirmed appropriate placement and secretions were suctioned out of airway. No complications.  ? ?Complications/Tolerance ?None; patient tolerated the procedure well. ? ? ?EBL ?None ? ?Specimen(s) ?None ? ?Julian Hy, DO 09/22/21 2:41 PM ?[facility] ? ?

## 2021-09-23 ENCOUNTER — Inpatient Hospital Stay (HOSPITAL_COMMUNITY): Payer: BC Managed Care – PPO

## 2021-09-23 DIAGNOSIS — G936 Cerebral edema: Secondary | ICD-10-CM | POA: Diagnosis not present

## 2021-09-23 DIAGNOSIS — I63511 Cerebral infarction due to unspecified occlusion or stenosis of right middle cerebral artery: Secondary | ICD-10-CM | POA: Diagnosis not present

## 2021-09-23 DIAGNOSIS — R509 Fever, unspecified: Secondary | ICD-10-CM | POA: Diagnosis not present

## 2021-09-23 DIAGNOSIS — I63411 Cerebral infarction due to embolism of right middle cerebral artery: Secondary | ICD-10-CM | POA: Diagnosis not present

## 2021-09-23 DIAGNOSIS — E119 Type 2 diabetes mellitus without complications: Secondary | ICD-10-CM | POA: Diagnosis not present

## 2021-09-23 DIAGNOSIS — A419 Sepsis, unspecified organism: Secondary | ICD-10-CM | POA: Diagnosis not present

## 2021-09-23 DIAGNOSIS — I639 Cerebral infarction, unspecified: Secondary | ICD-10-CM | POA: Diagnosis not present

## 2021-09-23 DIAGNOSIS — R6520 Severe sepsis without septic shock: Secondary | ICD-10-CM

## 2021-09-23 LAB — BASIC METABOLIC PANEL
Anion gap: 9 (ref 5–15)
BUN: 39 mg/dL — ABNORMAL HIGH (ref 6–20)
CO2: 26 mmol/L (ref 22–32)
Calcium: 8.6 mg/dL — ABNORMAL LOW (ref 8.9–10.3)
Chloride: 116 mmol/L — ABNORMAL HIGH (ref 98–111)
Creatinine, Ser: 1.33 mg/dL — ABNORMAL HIGH (ref 0.61–1.24)
GFR, Estimated: >60 mL/min (ref >60)
Glucose, Bld: 158 mg/dL — ABNORMAL HIGH (ref 70–99)
Potassium: 3.3 mmol/L — ABNORMAL LOW (ref 3.5–5.1)
Sodium: 151 mmol/L — ABNORMAL HIGH (ref 135–145)

## 2021-09-23 LAB — CBC
HCT: 34.3 % — ABNORMAL LOW (ref 39.0–52.0)
Hemoglobin: 11 g/dL — ABNORMAL LOW (ref 13.0–17.0)
MCH: 28.9 pg (ref 26.0–34.0)
MCHC: 32.1 g/dL (ref 30.0–36.0)
MCV: 90 fL (ref 80.0–100.0)
Platelets: 160 10*3/uL (ref 150–400)
RBC: 3.81 MIL/uL — ABNORMAL LOW (ref 4.22–5.81)
RDW: 14.6 % (ref 11.5–15.5)
WBC: 14.5 10*3/uL — ABNORMAL HIGH (ref 4.0–10.5)
nRBC: 0 % (ref 0.0–0.2)

## 2021-09-23 LAB — GLUCOSE, CAPILLARY
Glucose-Capillary: 160 mg/dL — ABNORMAL HIGH (ref 70–99)
Glucose-Capillary: 172 mg/dL — ABNORMAL HIGH (ref 70–99)
Glucose-Capillary: 182 mg/dL — ABNORMAL HIGH (ref 70–99)
Glucose-Capillary: 206 mg/dL — ABNORMAL HIGH (ref 70–99)
Glucose-Capillary: 207 mg/dL — ABNORMAL HIGH (ref 70–99)
Glucose-Capillary: 241 mg/dL — ABNORMAL HIGH (ref 70–99)

## 2021-09-23 IMAGING — DX DG ABD PORTABLE 1V
1 series · 1 of 1 positions shown · non-contrast
Comparison: Same day radiograph

CLINICAL DATA: NG tube placement

EXAM:
PORTABLE ABDOMEN - 1 VIEW

[abdomen]
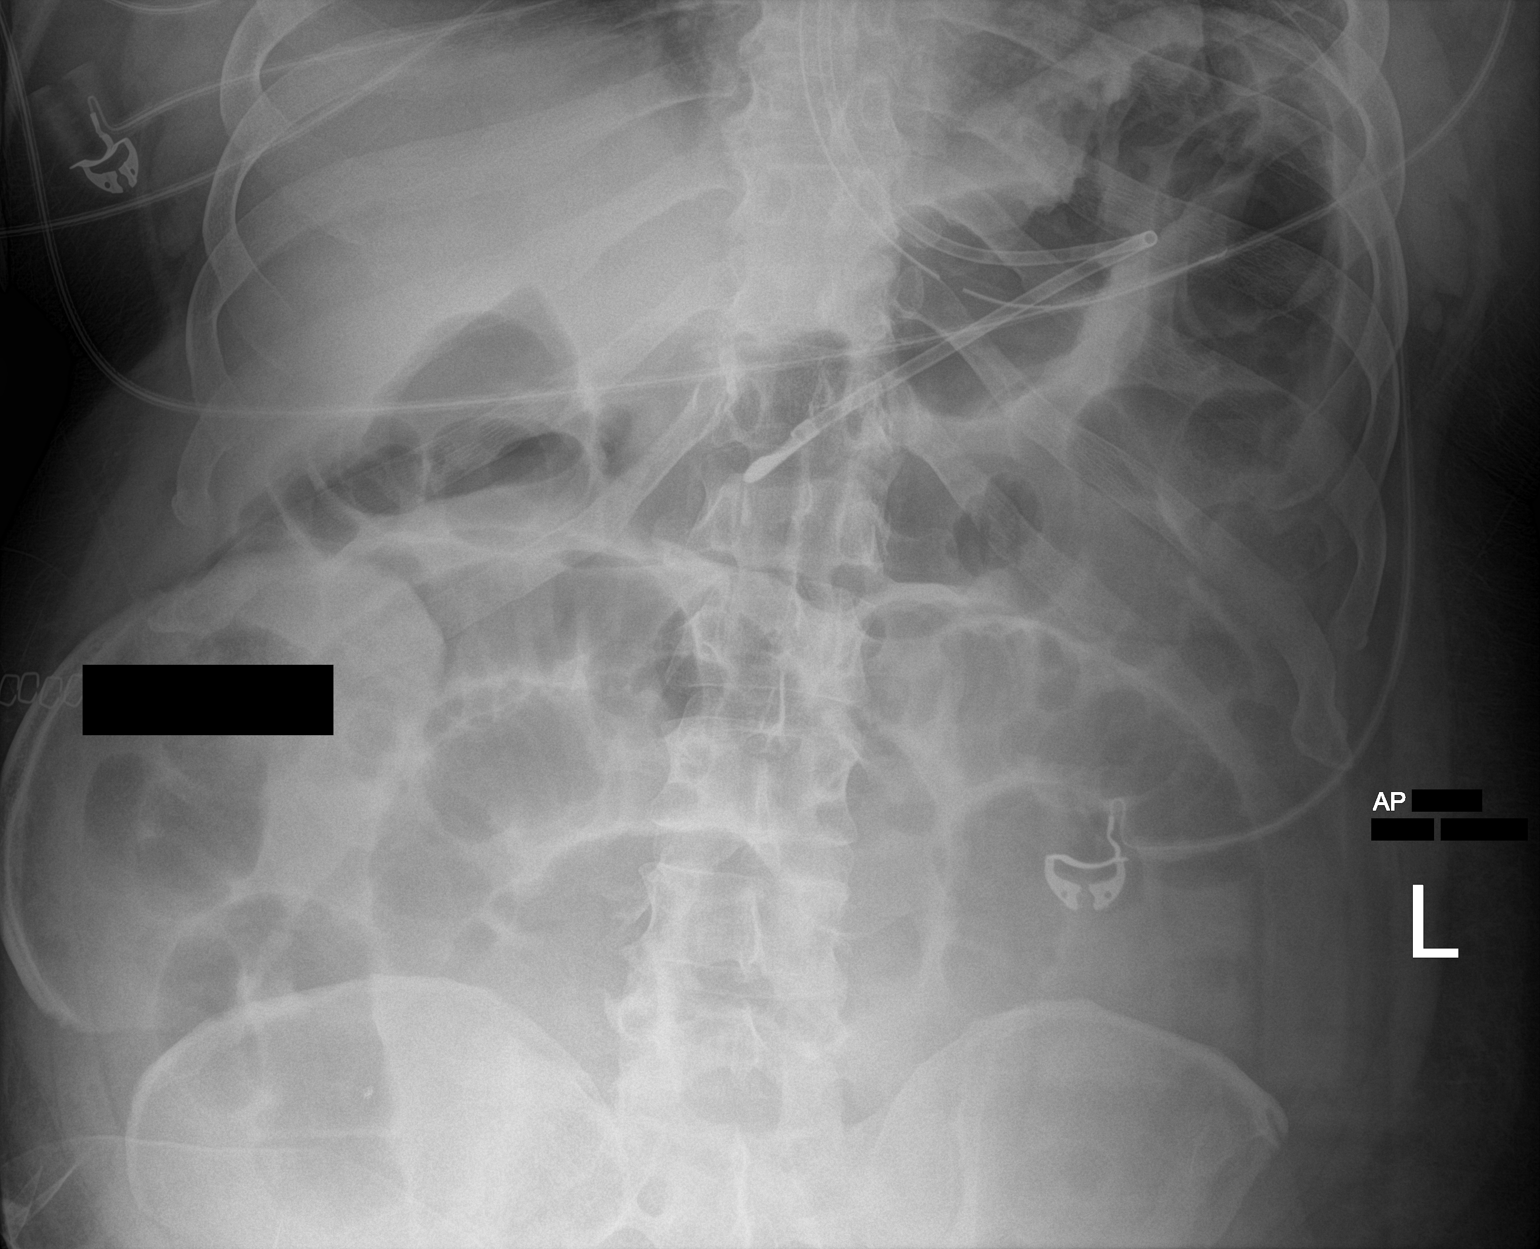

[1 of 1 positions shown; findings below may reference images not displayed]

FINDINGS: There is a weighted tip feeding tube with tip overlying the body of
the stomach. There is a nasogastric tube with tip and side port
overlying the stomach. There is diffuse gaseous distension of small
and large bowel.
IMPRESSION: Weighted tip feeding tube tip overlies the body of the stomach.

Nasogastric tube tip and side port overlie the stomach.

Persistent diffuse gaseous distension of small and large bowel,
compatible with adynamic ileus.

## 2021-09-23 IMAGING — DX DG ABDOMEN 1V
1 series · 1 of 1 positions shown · non-contrast
Comparison: Multiple KUB, most recently [DATE]. CT AP,
[DATE]

CLINICAL DATA: Ileus

EXAM:
ABDOMEN - 1 VIEW

[abdomen]
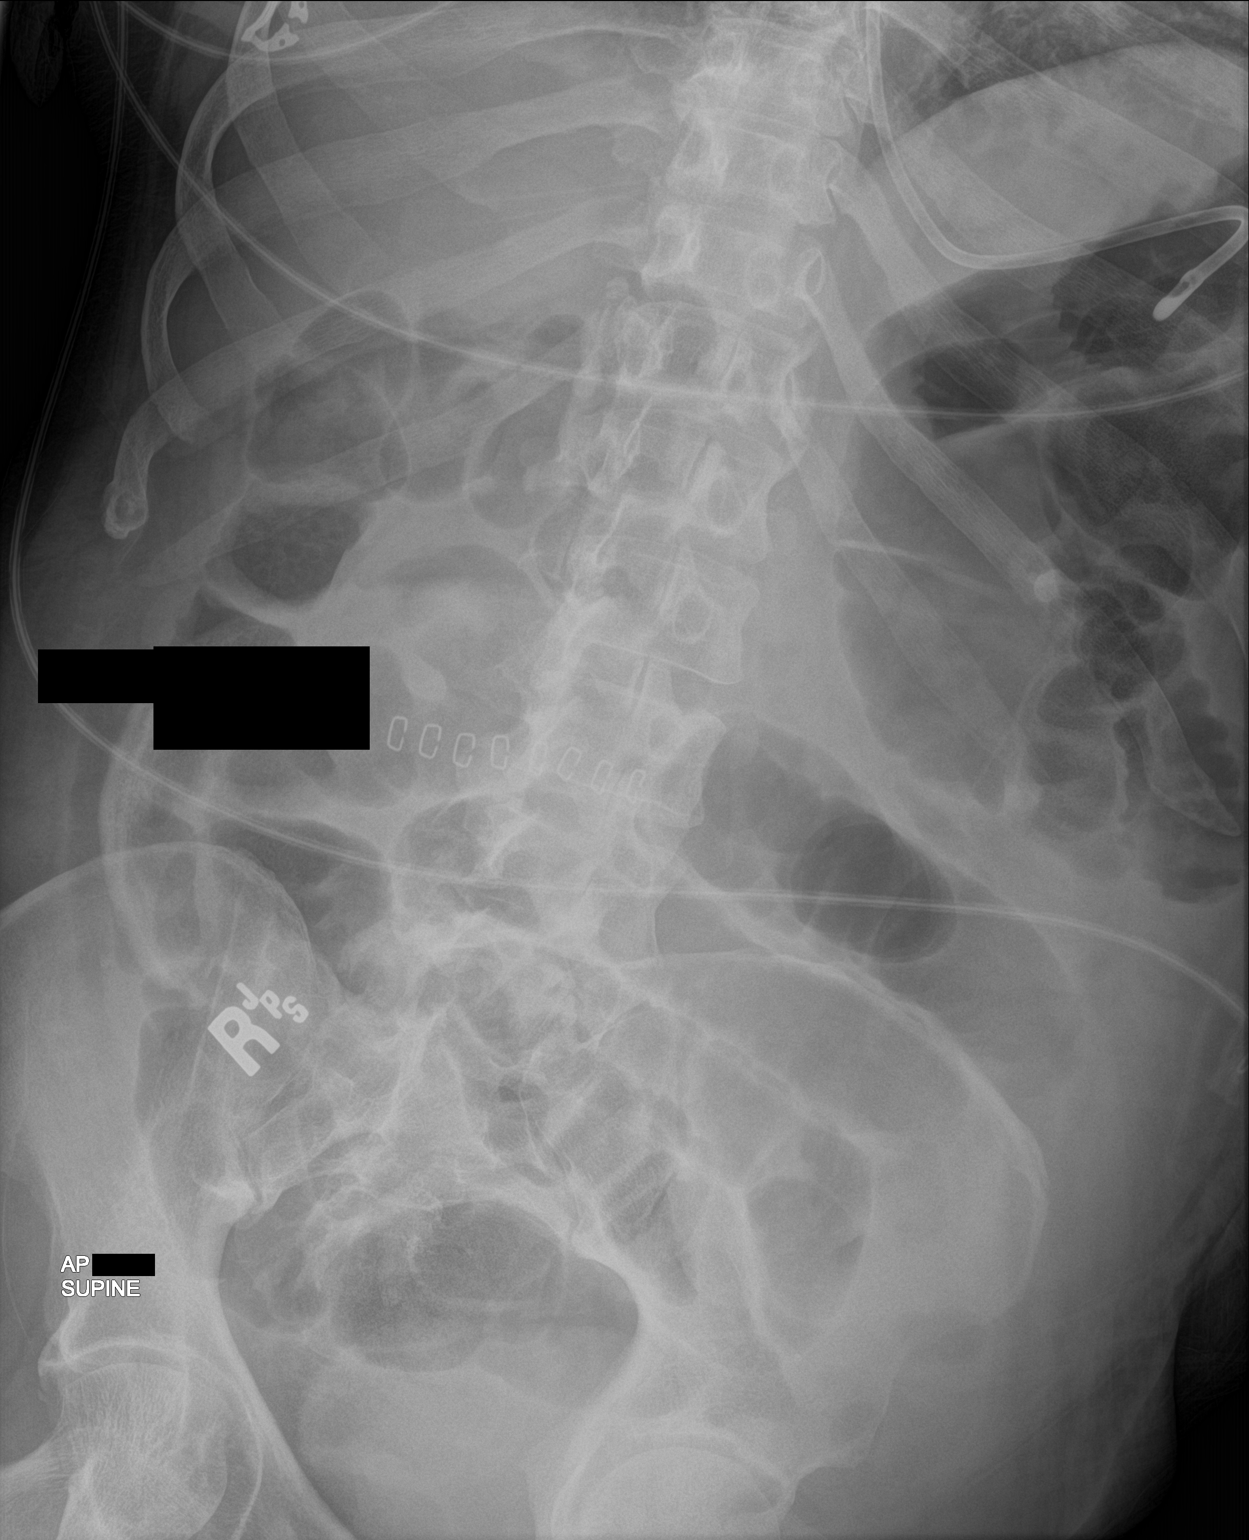

[1 of 1 positions shown; findings below may reference images not displayed]

FINDINGS: Support lines: Small bore enteric feeding tube with tip within
stomach.

Multiple air-and-fluid dilated loops of small bowel. Mild air
distention of colon.

Overlying surgical staples.  No interval osseous abnormality.
IMPRESSION: 1. Small bore enteric feeding tube within stomach. Consider
advancement if transpyloric placement is desired.
2. Persistent air-and-fluid distention of small bowel, and to a
lesser degree of colon.
Findings consistent with reported ileus.

## 2021-09-23 MED ORDER — GLUCERNA 1.5 CAL PO LIQD
1000.0000 mL | ORAL | Status: DC
  Filled 2021-09-23: qty 1000

## 2021-09-23 MED ORDER — POTASSIUM CHLORIDE 10 MEQ/100ML IV SOLN
10.0000 meq | INTRAVENOUS | Status: AC
  Administered 2021-09-23 (×2): 10 meq via INTRAVENOUS
  Filled 2021-09-23 (×2): qty 100

## 2021-09-23 MED ORDER — VANCOMYCIN HCL 2000 MG/400ML IV SOLN
2000.0000 mg | Freq: Once | INTRAVENOUS | Status: AC
  Administered 2021-09-23: 2000 mg via INTRAVENOUS
  Filled 2021-09-23: qty 400

## 2021-09-23 MED ORDER — SODIUM CHLORIDE 0.9 % IV SOLN
2.0000 g | Freq: Three times a day (TID) | INTRAVENOUS | Status: DC
  Administered 2021-09-23 – 2021-09-24 (×3): 2 g via INTRAVENOUS
  Filled 2021-09-23 (×3): qty 12.5

## 2021-09-23 MED ORDER — INSULIN DETEMIR 100 UNIT/ML ~~LOC~~ SOLN
30.0000 [IU] | Freq: Two times a day (BID) | SUBCUTANEOUS | Status: DC
  Filled 2021-09-23: qty 0.3

## 2021-09-23 MED ORDER — METOCLOPRAMIDE HCL 5 MG/5ML PO SOLN
10.0000 mg | Freq: Four times a day (QID) | ORAL | Status: AC
  Administered 2021-09-23 – 2021-09-25 (×8): 10 mg
  Filled 2021-09-23 (×9): qty 10

## 2021-09-23 MED ORDER — VANCOMYCIN HCL IN DEXTROSE 1-5 GM/200ML-% IV SOLN
1000.0000 mg | Freq: Two times a day (BID) | INTRAVENOUS | Status: DC
  Administered 2021-09-23: 1000 mg via INTRAVENOUS
  Filled 2021-09-23: qty 200

## 2021-09-23 MED ORDER — INSULIN DETEMIR 100 UNIT/ML ~~LOC~~ SOLN
25.0000 [IU] | Freq: Two times a day (BID) | SUBCUTANEOUS | Status: DC
  Administered 2021-09-23: 25 [IU] via SUBCUTANEOUS
  Filled 2021-09-23 (×3): qty 0.25

## 2021-09-23 MED ORDER — POTASSIUM CHLORIDE 20 MEQ PO PACK
20.0000 meq | PACK | ORAL | Status: AC
  Administered 2021-09-23 (×2): 20 meq
  Filled 2021-09-23 (×2): qty 1

## 2021-09-23 NOTE — Progress Notes (Signed)
[facility] ADULT ICU REPLACEMENT PROTOCOL ? ? ?The patient does apply for the [facility] Adult ICU Electrolyte Replacment Protocol based on the criteria listed below:  ? ?1.Exclusion criteria: TCTS patients, ECMO patients, and Dialysis patients ?2. Is GFR >/= 30 ml/min? Yes.    ?Patient's GFR today is >60 ?3. Is SCr </= 2? Yes.   ?Patient's SCr is 1.33 mg/dL ?4. Did SCr increase >/= 0.5 in 24 hours? No. ?5.Pt's weight >40kg  Yes.   ?6. Abnormal electrolyte(s): K 3.3  ?7. Electrolytes replaced per protocol ?8.  Call MD STAT for K+ </= 2.5, Phos </= 1, or Mag </= 1 ?Physician:  Lucile Shutters ? ? ?Alechia Lezama A 09/23/2021 5:59 AM ? ?

## 2021-09-23 NOTE — Progress Notes (Signed)
TOC Consult for LTAC Evaluation: ? ?Spoke with patient's wife, [name], at bedside to discuss possible transition to [facility].  She is open to considering this, and would like to see him transition to inpatient rehab eventually.  She would like to do some research herself before making a choice of facility.  She gives permission for her contact information to be shared with [facility] and [facility] admissions coordinators.  Will follow up after she speaks with [employer].  ? ?Reinaldo Raddle, RN, BSN  ?Trauma/Neuro ICU Case Manager ?[phone]  ?

## 2021-09-23 NOTE — Evaluation (Signed)
Physical Therapy Re Evaluation Patient Details Name: Jacob [name] MRN: [id number] DOB: 1975/07/27 Today's Date: 09/23/2021  History of Present Illness  46 y.o. male presents to [facility] hospital on 09/12/2021 with L facial droop and arm weakness. CTA revealed R M1 occlusion. Pt received tNK. Pt underwent thrombectomy on 4/21. Underwent R frontotemporal parietal hemicraniectomy on 4/24 after declining 4/23.  PMH includes HTN and DM.  Clinical Impression  Pt admitted with above diagnosis. Pt re evaluated after decline with craniectomy and return to ICU. Pt with eyes opening only occasionally during session and not following any commands. Spontaneously moving R side but not to command. No noted mvmt L side. PROM performed, recommend prevalon boot for LLE. Pt required tot A +2 to come to sitting EOB and max A +2 to maintain sitting. Decreasing PT frequency to 2x/ wk with change in rec to [facility].  Pt currently with functional limitations due to the deficits listed below (see PT Problem List). Pt will benefit from skilled PT to increase their independence and safety with mobility to allow discharge to the venue listed below.          Recommendations for follow up therapy are one component of a multi-disciplinary discharge planning process, led by the attending physician.  Recommendations may be updated based on patient status, additional functional criteria and insurance authorization.  Follow Up Recommendations PT at Long-term acute care hospital    Assistance Recommended at Discharge Frequent or constant Supervision/Assistance  Patient can return home with the following  Two people to help with walking and/or transfers;Two people to help with bathing/dressing/bathroom;Assistance with cooking/housework;Assistance with feeding;Direct supervision/assist for medications management;Direct supervision/assist for financial management;Assist for transportation;Help with stairs or ramp for entrance    Equipment  Recommendations Wheelchair (measurements PT);Wheelchair cushion (measurements PT);BSC/3in1;Hospital bed  Recommendations for Other Services       Functional Status Assessment Patient has had a recent decline in their functional status and demonstrates the ability to make significant improvements in function in a reasonable and predictable amount of time.     Precautions / Restrictions Precautions Precautions: Fall Precaution Comments: trach, intubated, R crani with bone flap in abdomen Restrictions Weight Bearing Restrictions: No      Mobility  Bed Mobility Overal bed mobility: Needs Assistance Bed Mobility: Rolling, Sidelying to Sit, Sit to Supine Rolling: +2 for physical assistance, +2 for safety/equipment, Total assist Sidelying to sit: Total assist, +2 for physical assistance, +2 for safety/equipment   Sit to supine: Total assist, +2 for physical assistance, +2 for safety/equipment   General bed mobility comments: +3 helpful, pt not participating in any way    Transfers                        Ambulation/Gait                  Stairs            Wheelchair Mobility    Modified Rankin (Stroke Patients Only) Modified Rankin (Stroke Patients Only) Pre-Morbid Rankin Score: No symptoms Modified Rankin: Severe disability     Balance Overall balance assessment: Needs assistance Sitting-balance support: Feet supported Sitting balance-Leahy Scale: Zero Sitting balance - Comments: total A for sitting balance                                     Pertinent Vitals/Pain Pain Assessment Pain Assessment: Faces Faces  Pain Scale: No hurt Breathing: normal Negative Vocalization: none Facial Expression: smiling or inexpressive Body Language: relaxed Consolability: no need to console PAINAD Score: 0    Home Living Family/patient expects to be discharged to:: Private residence Living Arrangements: Spouse/significant  other;Children Available Help at Discharge: Family;Available 24 hours/day Type of Home: House Home Access: Stairs to enter Entrance Stairs-Rails: None Entrance Stairs-Number of Steps: 1 Alternate Level Stairs-Number of Steps: full flight Home Layout: Two level;Able to live on main level with bedroom/bathroom Home Equipment: None      Prior Function Prior Level of Function : Independent/Modified Independent;Working/employed;Driving             Mobility Comments: 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, 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 Does not move RLE to command LLE Deficits / Details: no active motion noted, pt does respond to pain, PROM WFL LLE Sensation: decreased proprioception;decreased light touch LLE Coordination: decreased gross motor;decreased fine motor    Cervical / Trunk Assessment Cervical / Trunk Assessment: Kyphotic;Other exceptions Cervical / Trunk Exceptions: trach, crani, cervical weakness noted in sitting, needed max A to maintain neck in midline instead of flexion  Communication   Communication: Expressive difficulties;Tracheostomy  Cognition Arousal/Alertness: Lethargic Behavior During Therapy: Flat affect Overall Cognitive Status: Difficult to assess                                 General Comments: pt's eyes closed 100% of the session, no command following, did  not increase LOA once sitting EOB or to any stimulation        General Comments General comments (skin integrity, edema, etc.): VSS, RN present. Rec L prevalon    Exercises General Exercises - Lower Extremity Ankle Circles/Pumps: PROM, Left, 10 reps, Supine Long Arc Quad: PROM, 10 reps, Left, Seated Heel Slides: PROM, Left, 10 reps, Supine Hip ABduction/ADduction: PROM, 10 reps, Left, Supine   Assessment/Plan    PT Assessment Patient needs continued PT services  PT Problem List Decreased strength;Decreased activity tolerance;Decreased balance;Decreased mobility;Decreased coordination;Decreased cognition;Decreased knowledge of use of DME;Decreased safety awareness;Decreased knowledge of precautions;Impaired sensation;Impaired tone       PT Treatment Interventions DME instruction;Gait training;Stair training;Functional mobility training;Therapeutic activities;Therapeutic exercise;Balance training;Neuromuscular re-education;Cognitive remediation;Patient/family education;Wheelchair mobility training    PT Goals (Current goals can be found in the Care Plan section)  Acute Rehab PT Goals Patient Stated Goal: to return to independence in mobility PT Goal Formulation: Patient unable to participate in goal setting Time For Goal Achievement: 10/07/21 Potential to Achieve Goals: Fair    Frequency Min 2X/week     Co-evaluation PT/OT/SLP Co-Evaluation/Treatment: Yes Reason for Co-Treatment: Complexity of the patient's impairments (multi-system involvement);Necessary to address cognition/behavior during functional activity;For patient/therapist safety PT goals addressed during session: Mobility/safety with mobility;Balance;Strengthening/ROM OT goals addressed during session: ADL's and self-care       AM-PAC PT "6 Clicks" Mobility  Outcome Measure Help needed turning from your back to your side while  in a flat bed without using bedrails?: Total Help needed moving from lying on your back  to sitting on the side of a flat bed without using bedrails?: Total Help needed moving to and from a bed to a chair (including a wheelchair)?: Total Help needed standing up from a chair using your arms (e.g., wheelchair or bedside chair)?: Total Help needed to walk in hospital room?: Total Help needed climbing 3-5 steps with a railing? : Total 6 Click Score: 6    End of Session Equipment Utilized During Treatment: Oxygen Activity Tolerance: Patient tolerated treatment well Patient left: with call bell/phone within reach;in bed;with bed alarm set;with restraints reapplied Nurse Communication: Mobility status PT Visit Diagnosis: Other abnormalities of gait and mobility (R26.89);Muscle weakness (generalized) (M62.81);Other symptoms and signs involving the nervous system (R29.898);Hemiplegia and hemiparesis Hemiplegia - Right/Left: Left Hemiplegia - dominant/non-dominant: Non-dominant Hemiplegia - caused by: Cerebral infarction    Time: 4696-2952 PT Time Calculation (min) (ACUTE ONLY): 30 min   Charges:   PT Evaluation $PT Re-evaluation: 1 Re-eval          Lyanne Co, PT  Acute Rehab Services  Pager [phone] Office [phone]   Lawana Chambers Ayinde Swim 09/23/2021, 4:23 PM

## 2021-09-23 NOTE — Progress Notes (Signed)
? ?NAME:  Jacob [name], MRN:  [id number], DOB:  Aug 21, 1975, LOS: 47 ?ADMISSION DATE:  09/12/2021, CONSULTATION DATE:  09/15/2021 ?REFERRING MD:  Leonie Man - Stroke, CHIEF COMPLAINT:  Cerebral edema.   ? ?History of Present Illness:  ?46 year old man with worsening cerebral edema. ? ?He presented 4/21 with left-sided weakness which was initially waxing and waning. CTA showed right M1 occlusion.  Further decline post TNK brought for thrombectomy at [facility] revascularization of right M2. ?Transfer to floor but experienced neurological decline 4/24.  CT showed malignant cerebral edema with 12 mm right to left midline shift and Trapping of the temporal horn. ? ?He was brought back to the ICU and started on 3% hypertonic saline and prepped for the operating room.  Platelets were transfused to offset effect of clopidogrel and aspirin. ? ?Taken to OR and underwent right frontotemporal parietal hemicraniectomy with placement of bone flap in abdominal subcutaneous tissue. Returned to ICU on vent. ? ?Pertinent  Medical History  ? ?Past Medical History:  ?Diagnosis Date  ? Allergy   ? Diabetes mellitus without complication ([place])   ? Hypertension   ? ?Significant Hospital Events: ?Including procedures, antibiotic start and stop dates in addition to other pertinent events   ?4/24 decompressive craniectomy. ?4/25 decreased midline shift on CT ?4/26 still on HT saline.  Switched from prop to precedex. BP meds titrated up in effort to decrease cleviprex. Cortrak placed.  ?4/27 Cleviprex maxed out, started on labetolol gtt. Started scheduled clonidine, also started coreg. Foley replaced and started on doxazosin due to urinary retention. HT infusion cut in half w/ plan to work towards stopping. Stopped fent gtt. Changed to PRN w/ plan to start precedex if needed.  Checking [place] of UE to eval fever. Also sending PCT ? ? ?Interim History / Subjective:  ?Febrile overnight again. Complaining of some pain around trach site. ? ?Objective   ?Blood  pressure (!) 136/96, pulse 80, temperature 99.5 ?F (37.5 ?C), temperature source Oral, resp. rate 17, height 6\' 1"  (1.854 m), weight 110 kg, SpO2 100 %. ?   ?Vent Mode: PSV;CPAP ?FiO2 (%):  [30 %-50 %] 50 % ?Set Rate:  [18 bmp] 18 bmp ?Vt Set:  [id number] mL] 630 mL ?PEEP:  [5 cmH20] 5 cmH20 ?Pressure Support:  [8 [id number] cmH20] 12 cmH20 ?Plateau Pressure:  [19 cmH20-28 cmH20] 19 cmH20  ? ?Intake/Output Summary (Last 24 hours) at 09/23/2021 0900 ?Last data filed at 09/23/2021 0857 ?Gross per 24 hour  ?Intake 457.19 ml  ?Output 4105 ml  ?Net -3647.81 ml  ? ? ?Filed Weights  ? 09/17/21 0403 09/20/21 0500 09/21/21 0500  ?Weight: 105.1 kg 109.9 kg 110 kg  ? ? ?Examination: ?General ill appearing man lying in bed in NAD  ?HENT Incision stapled without erythema or drainage.  ?Neck: trach in place, no bleeding or significant drainage ?Pulm mild tan, blood-tinged secretions, no wheezing ?Card S1S2, RRR ?Abd softer today, NT ?Ext ongoing edema, no cyanosis ?Neuro Opens his eyes more today but requires very significant prompting to get this. Nodding to answer questions and following commands on left side, no movement on the R.  ? ?Na+  151 ?K+ 3.3 ?BUN 39 ?Cr 1.33 ?WBC 14.5 ?H/H 11/34.3 ?Platelets 160 ?Trach aspirate> mod GPCs ? ?KUB personally reviewed> still has dilated loops of bowel, no air fluid levels ? ?Assessment & Plan:  ?Active Problems: ?  Diabetes mellitus without complication ([place]) ?  Essential hypertension ?  Hyperlipidemia ?  Cerebral edema (HCC) ?  Acute respiratory failure with hypoxia ([place]) ?  Urinary retention ?  Fever ?  Hypernatremia ?  Cerebral embolism with cerebral infarction ? ? ?Acute right MCA stroke (s/p tPA and mechanical thrombectomy) complicated by malignant MCA syndrome due to cerebral edema requiring decompressive craniectomy on 4/24 ?Follow-up CT 4/28 shows large infarct R MCA, but essentially complete resolution of shift.  ?-avoiding sedation ?-needs PT, OT, SLP; anticipate a slow recovery but he can  follow commands and is appropriate for therapy ?-neuroprotective measures: HOB>30 degrees, control Bgs, maintain electrolytes WNL, aggressively treat fevers ?-bone flap in R abdomen ?-ASA, statin ?-control BP  ? ?Poorly controlled HTN ?-con't coreg, clonidine, amlodipine, lisinopril, doxazosin ? ?Acute hypoxic respiratory failure requiring MV ?-LTVV ?-vent weaning with daily breathing trials ?-routine trach care, can remove trach sutures after 7 days ?-VAP prevention protocol ?-tylenol and fentanyl for incisional pain from trach ? ?Ileus ?-replace large bore NGT to decompress ? ?Fluid and electrolyte imbalance: therapeutic hypernatremia, secondary hyperchloremia ?hypokalemia ?-hold FWF today ?-no additional lasix today ?-K+ repletion ? ?DM with hyperglycemia, improved control. ?-holding TF coverage while TF held ?-increase detemir to 25 units BID ? ?Urinary retention  ?-bladder scan today; if retaining check post-void residual. If still retaining needs UA and culture. ?-con't urecholine and doxazosin ? ?Fevers> concern for pneumonia vs sinusitis ?-blood cultures ?-start empiric vanc and cefepime; if sinusitis confirmed will need 10 day course ?-will have head CT tomorrow to reassess cerebral edema; at that time can assess sinuses ?-follow trach aspirate culture ? ?Acute anemia and acute thrombocytopenia due to critical illness ?-monitor ?-transfuse for Hb <7 or hemodynamically significant bleeding ? ?AKI, resolved ?-strict I/Os ?-renally dose meds, avoid nephrotoxic meds ? ? ?Best Practice (right click and "Reselect all SmartList Selections" daily)  ? ?Diet/type: tubefeeds- on hold for ileus ?DVT prophylaxis: prophylactic heparin  ?GI prophylaxis: PPI ?Lines: Central line and No longer needed.  Order written to d/c  ?Foley:  N/A  ?Code Status:  full code ?Last date of multidisciplinary goals of care discussion [Wife updated at bedside 5/2] ? ?This patient is critically ill with multiple organ system failure which  requires frequent high complexity decision making, assessment, support, evaluation, and titration of therapies. This was completed through the application of advanced monitoring technologies and extensive interpretation of multiple databases. During this encounter critical care time was devoted to patient care services described in this note for 40 minutes. ? ?Julian Hy, DO 09/23/21 1:42 PM ?[facility] ? ?

## 2021-09-23 NOTE — Progress Notes (Signed)
RT note. ?Patient placed on SBT 12/5 40% sat 100% with no labored breathing noted, RR 25, vt 589.  ?Attempted patient on 10/5 but failed due to low mve/vt. RT will continue to monitor.  ?

## 2021-09-23 NOTE — Progress Notes (Signed)
? ?NAME:  Jacob [name], MRN:  [id number], DOB:  03/14/1976, LOS: 56 ?ADMISSION DATE:  09/12/2021, CONSULTATION DATE:  09/15/2021 ?REFERRING MD:  Leonie Man - Stroke, CHIEF COMPLAINT:  Cerebral edema.   ? ?History of Present Illness:  ?46 year old man with worsening cerebral edema. ? ?He presented 4/21 with left-sided weakness which was initially waxing and waning. CTA showed right M1 occlusion.  Further decline post TNK brought for thrombectomy at [facility] revascularization of right M2. ?Transfer to floor but experienced neurological decline 4/24.  CT showed malignant cerebral edema with 12 mm right to left midline shift and Trapping of the temporal horn. ? ?He was brought back to the ICU and started on 3% hypertonic saline and prepped for the operating room.  Platelets were transfused to offset effect of clopidogrel and aspirin. ? ?Taken to OR and underwent right frontotemporal parietal hemicraniectomy with placement of bone flap in abdominal subcutaneous tissue. Returned to ICU on vent. ? ?Pertinent  Medical History  ? ?Past Medical History:  ?Diagnosis Date  ? Allergy   ? Diabetes mellitus without complication ([address])   ? Hypertension   ? ?Significant Hospital Events: ?Including procedures, antibiotic start and stop dates in addition to other pertinent events   ?4/24 decompressive craniectomy. ?4/25 decreased midline shift on CT ?4/26 still on HT saline.  Switched from prop to precedex. BP meds titrated up in effort to decrease cleviprex. Cortrak placed.  ?4/27 Cleviprex maxed out, started on labetolol gtt. Started scheduled clonidine, also started coreg. Foley replaced and started on doxazosin due to urinary retention. HT infusion cut in half w/ plan to work towards stopping. Stopped fent gtt. Changed to PRN w/ plan to start precedex if needed.  Checking [place] of UE to eval fever. Also sending PCT ?5/1 trached, c/o pain upper abd, tmax 101.3, diarrhea overnight ? ?Interim History / Subjective:  ?Febrile to 102.7 overnight,  WBC 12.3> 14.5 ?No reports of abd pain today, less distention, KUB c/w ileus  ?C/o of mild trach pain ? ?Objective   ?Blood pressure (!) 136/96, pulse 80, temperature 99.5 ?F (37.5 ?C), temperature source Oral, resp. rate 17, height 6\' 1"  (1.854 m), weight 110 kg, SpO2 100 %. ?   ?Vent Mode: PSV;CPAP ?FiO2 (%):  [30 %-50 %] 50 % ?Set Rate:  [18 bmp] 18 bmp ?Vt Set:  [id number] mL] 630 mL ?PEEP:  [5 cmH20] 5 cmH20 ?Pressure Support:  [8 [id number] cmH20] 12 cmH20 ?Plateau Pressure:  [19 cmH20-28 cmH20] 19 cmH20  ? ?Intake/Output Summary (Last 24 hours) at 09/23/2021 0917 ?Last data filed at 09/23/2021 0857 ?Gross per 24 hour  ?Intake 657.19 ml  ?Output 4105 ml  ?Net -3447.81 ml  ? ?Filed Weights  ? 09/17/21 0403 09/20/21 0500 09/21/21 0500  ?Weight: 105.1 kg 109.9 kg 110 kg  ? ?Examination: ?General:  critically ill male lying in bed in NAD ?HEENT: MM pink/moist, pupils 3/reactive, cortrak, midline sutured cuffed trach- site wnl, Right crani staples wnl  ?Neuro: sluggish on commands but will lightly open eyes, and f/c on right, localized RUE, RLE 2/5, flaccid on left ?CV: rr, NSR ?PULM:  non labored, CTA, weaning 12/5, minimal tan/blood tinged secretions ?GI: soft, +bs, some ongoing distention but no obvious tenderness, staples to flap on right intact, site wnl  ?Extremities: warm/dry, generalized more in UE  ?Skin: some scattered small pustular rash to upper torso, left arm ? ?UOP 3.6L/ 24 hrs ?Net +3.7 ? ?Na+  153> 151 ?K+ 3.3 ?BUN 39 ?Cr 1.2> 1.33 ?  WBC 12.3 > 14.5 ?H/H 9.9/30.3 > 11/ 34.3 ?Platelets 136 > 160 ? ?Assessment & Plan:  ?Active Problems: ?  Diabetes mellitus without complication ([place]) ?  Essential hypertension ?  Hyperlipidemia ?  Cerebral edema (HCC) ?  Acute respiratory failure with hypoxia ([place]) ?  Urinary retention ?  Fever ?  Hypernatremia ?  Cerebral embolism with cerebral infarction ? ? ?Acute right MCA stroke (s/p tPA and mechanical thrombectomy) complicated by Cerebral edema requiring decompressive  craniectomy on 4/24 ?Follow-up CT 4/28 shows large infarct R MCA, but essentially complete resolution of shift.  ?- con't to minimize sedation/ limit narcotics  ?- per Neurology ?- neuroprotective measures> HOB >30 degrees, avoid hypocapnia, maintain normal BG and electrolytes ?- goal SBP <160 ?- bone flap in R abdomen ?- cont amantadine  ?- Keppra per neurology ?- plans for [facility] 5/3 ?- cont ASA/ lipitor  ?- cont cortrak, may need Gtube ? ?Poorly controlled HTN ?- con't coreg, clonidine, amlodipine, lisinopril, doxazosin ? ?Acute hypoxic respiratory failure requiring MV ?- s/p trach 5/1 ?- Continue MV support, 8cc/kg IBW with goal Pplat <30 and DP<15  ?- VAP prevention protocol/ PPI ?- PAD protocol for sedation> tylenol, prn fentanyl (limit narcotics with ileus) ?- sat goal SpO2 >92%  ?- daily SBT, goal is eventually ATC ? ?Ileus ?- less distention today and no c/o of abd pain ?- start trickle TF- do not advance and monitor closely  ?- keep K> 4 ?- check Qtc, consider reglan  ? ?Fluid and electrolyte imbalance: therapeutic hypernatremia, secondary hyperchloremia ?hypokalemia ?- con't low dose FWF ?- hold further lasix today given fever and increased insensible losses  ?- replete K again today  ?- check mag in am  ? ?DM with hyperglycemia, improved control. ?- cont SSI resistant  ?- increase cont levemir 30 units BID given rising BG and starting trickle feeds ? ?Urinary retention  ?- continue to monitor for urinary retention, prn bladder scan  ?- con't urecholine and doxazosin ? ?Fevers> unclear whether it is central or ?sinuitis given some nasal drainage 5/1 ?- foley and PICC removed 5/1 ?- follow trach asp> moderate GPCs >  ?- blood cultures x 2 today ?- start cefepime/ vanc today  ?- going for [facility] 5/3, can evaluate sinuses then, may need longer course of abx if so ?- no urinary retention thus far, continue to monitor, consider UA ? ?Acute anemia and acute thrombocytopenia due to critical illness ?- transfuse for  Hb<7 or hemodynamically significant bleeding ?- trend on CBC ? ?AKI ?-sCr slightly up today, holding lasix, cont FWF, monitor for urinary retention ?- strict I/O ?- trend renal indices ?- Avoid nephrotoxic agents, ensure adequate renal perfusion ? ?Best Practice (right click and "Reselect all SmartList Selections" daily)  ? ?Diet/type: tubefeeds- on hold for ileus ?DVT prophylaxis: LMWH ?GI prophylaxis: PPI ?Lines: Central line and No longer needed.  Order written to d/c  ?Foley:  N/A  ?Code Status:  full code ?Last date of multidisciplinary goals of care discussion [Wife updated at bedside 5/1] ? ? ?CCT 35 mins ? ?Kennieth Rad, ACNP ?[facility] ?09/23/2021, 9:18 AM ? ?See Amion for pager ?If no response to pager, please call PCCM consult pager ?After 7:00 pm call Elink   ? ?

## 2021-09-23 NOTE — Progress Notes (Signed)
Subjective: ?The patient is somnolent but arousable.  He is in no apparent distress.  His wife is at the bedside. ? ?Objective: ?Vital signs in last 24 hours: ?Temp:  [99.8 ?F (37.7 ?C)-102.7 ?F (39.3 ?C)] 101.2 ?F (38.4 ?C) (05/02 0500) ?Pulse Rate:  [73-102] 87 (05/02 0500) ?Resp:  [16-28] 18 (05/02 0500) ?BP: (122-168)/(85-97) 136/96 (05/02 0500) ?SpO2:  [93 %-100 %] 96 % (05/02 0500) ?FiO2 (%):  [30 %] 30 % (05/02 0327) ?Estimated body mass index is 31.99 kg/m? as calculated from the following: ?  Height as of this encounter: 6\' 1"  (1.854 m). ?  Weight as of this encounter: 110 kg. ? ? ?Intake/Output from previous day: ?05/01 0701 - 05/02 0700 ?In: [id number] [I.V.:164.4; NG/GT:380; IV Piggyback:201.6] ?Out: 4105 [Urine:3605; Emesis/NG output:500] ?Intake/Output this shift: ?No intake/output data recorded. ? ?Physical exam the patient is somnolent but arousable.  He follows commands on the left.  His scalp is softer.  His scalp and abdominal incisions are healing well. ? ?Lab Results: ?Recent Labs  ?  09/22/21 ?0500 09/23/21 ?0302  ?WBC 12.3* 14.5*  ?HGB 9.9* 11.0*  ?HCT 30.3* 34.3*  ?PLT 136* 160  ? ?BMET ?Recent Labs  ?  09/22/21 ?0500 09/23/21 ?0302  ?NA 153* 151*  ?K 3.3* 3.3*  ?CL 118* 116*  ?CO2 29 26  ?GLUCOSE 183* 158*  ?BUN 40* 39*  ?CREATININE 1.20 1.33*  ?CALCIUM 8.7* 8.6*  ? ? ?Studies/Results: ?DG Abd 1 View ? ?Result Date: 09/22/2021 ?CLINICAL DATA:  Ileus. EXAM: ABDOMEN - 1 VIEW COMPARISON:  September 17, 2021. FINDINGS: Feeding tube tip in stomach as on the prior study. Stomach moderately distended. Surgical staples over the RIGHT lower quadrant. Diffuse gaseous distension of both large and small bowel. Gas in the area of the rectum. On limited assessment no acute regional skeletal process. IMPRESSION: Findings most suggestive of global ileus. Would however suggest follow-up as the ascending colon shows dilation nearing 7 cm. Pattern bowel dilation is similar to recent imaging. Is Electronically Signed   By:  Zetta Bills M.D.   On: 09/22/2021 10:04  ? ?DG Chest Port 1 View ? ?Result Date: 09/22/2021 ?CLINICAL DATA:  Status post tracheostomy. EXAM: PORTABLE CHEST 1 VIEW COMPARISON:  Chest x-ray from same day at 0919 hours. FINDINGS: New tracheostomy tube with tip at the thoracic inlet between the clavicular heads. Unchanged feeding tube entering the stomach with the tip below the field of view. Stable cardiomediastinal silhouette. Continued low lung volumes. Worsening aeration of the left lower lobe with increasing retrocardiac opacity and silhouetting of the left hemidiaphragm. Unchanged mild atelectasis in the medial right lung base. No pleural effusion or pneumothorax. No acute osseous abnormality. IMPRESSION: 1. New tracheostomy tube with tip at the thoracic inlet. 2. Worsening left lower lobe atelectasis versus pneumonia. Electronically Signed   By: Titus Dubin M.D.   On: 09/22/2021 15:48  ? ?DG CHEST PORT 1 VIEW ? ?Result Date: 09/22/2021 ?CLINICAL DATA:  Hypoxia. EXAM: PORTABLE CHEST 1 VIEW COMPARISON:  Radiographs 09/20/2021 and 09/17/2021. FINDINGS: 0919 hours. Endotracheal tube terminates at the level of the clavicular heads. Feeding tube projects below the diaphragm, tip not visualized. There are persistent low lung volumes with unchanged patchy bibasilar and perihilar opacities bilaterally. No pneumothorax or significant pleural effusion identified. The heart size and mediastinal contours are stable. The bones appear unremarkable. Telemetry leads overlie the chest. IMPRESSION: Stable radiographic appearance of the chest with patchy bibasilar and perihilar pulmonary opacities. Unchanged position of the endotracheal and feeding  tubes. Electronically Signed   By: Richardean Sale M.D.   On: 09/22/2021 09:49  ? ?DG Abd Portable 1V ? ?Result Date: 09/22/2021 ?CLINICAL DATA:  NG tube placement EXAM: PORTABLE ABDOMEN - 1 VIEW COMPARISON:  Sep 22, 2021 9:46 a.m. FINDINGS: The previously noted feeding tube is unchanged  in the mid stomach. There is interval placement of a nasogastric tube with distal tip in the mid stomach. Bowel gas pattern is unchanged compared to prior exam. IMPRESSION: Nasogastric tube with distal tip in the mid stomach. Electronically Signed   By: Abelardo Diesel M.D.   On: 09/22/2021 11:03  ? ?EEG adult ? ?Result Date: 09/21/2021 ?Derek Jack, MD     09/21/2021  8:38 PM Routine EEG Report CARMEL KEMMER is a 46 y.o. male with a history of right MCA stroke s/p hemicraniectomy who is undergoing an EEG to evaluate for seizures. Report: This EEG was acquired with electrodes placed according to the International 10-20 electrode system (including Fp1, Fp2, F3, F4, C3, C4, P3, P4, O1, O2, T3, T4, T5, T6, A1, A2, Fz, Cz, Pz). The following electrodes were missing or displaced: Fz/Cz/Pz displaced 2/2 large central surgical incision. The best background was 5-7 Hz. This activity is reactive to stimulation. No clear waking rhythm or sleep architecture was identified. There was focal slowing over the right hemisphere. There were no interictal epileptiform discharges. There were no electrographic seizures identified. Photic stimulation and hyperventilation were not performed. Impression and clinical correlation: This EEG was obtained while intubated and sedated on fentanyl and is abnormal due to: - Mild diffuse slowing indicative of global cerebral dysfunction - Right focal slowing indicative of superimposed focal cerebral dysfunction in that region Epileptiform abnormalities were not seen during this recording. Su Monks, MD [facility] [phone] If 7pm- 7am, please page neurology on call as listed in [place].   ? ?Assessment/Plan: ?Postop day #8: The patient follows commands.  We will continue supportive care and remove his staples. ? LOS: 11 days  ? ? ? ?[name] ?09/23/2021, 7:51 AM ? ? ? ? ?Patient ID: LETHA LUNDIE, male   DOB: 03/15/1976, 46 y.o.   MRN: [name] ? ?

## 2021-09-23 NOTE — Progress Notes (Signed)
Pharmacy Antibiotic Note ? ?Jacob [name] is a 46 y.o. male admitted on 09/12/2021 with pneumonia and sepsis.  Pharmacy has been consulted for Vancomycin/cefepime dosing. ? ?Scr 1.33 (up from 1.2) ?Tmax 102.7 ?Resp Culture: GPC ?Obtaining Blood cultures ? ?Plan: ?Vancomycin 2000mg  LD x1 followed by vancomycin 1000mg  q12hr ?Plan to obtain levels at steady state if therapy continued  ?Cefepime 2gm q8hr ?Will monitor for acute changes in renal function and adjust as needed ?F/u cultures results and de-escalate as appropriate ? ? ?Height: 6\' 1"  (185.4 cm) ?Weight: 110 kg (242 lb 8.1 oz) ?IBW/kg (Calculated) : 79.9 ? ?Temp (24hrs), Avg:101 ?F (38.3 ?C), Min:99.5 ?F (37.5 ?C), Max:102.7 ?F (39.3 ?C) ? ?Recent Labs  ?Lab 09/19/21 ?[id number] 09/20/21 ?[id number] 09/21/21 ?0206 09/22/21 ?0500 09/23/21 ?0302  ?WBC 10.7* 12.8* 11.7* 12.3* 14.5*  ?CREATININE 1.53* 1.15 1.24 1.20 1.33*  ?  ?Estimated Creatinine Clearance: 88.3 mL/min (A) (by C-G formula based on SCr of 1.33 mg/dL (H)).   ? ?Allergies  ?Allergen Reactions  ? Hydrochlorothiazide Other (See Comments)  ?  Bilateral muscle cramping  ? ? ?Thank you for allowing pharmacy to be a part of this patient?s care. ? ?Donnald Garre, PharmD ?Clinical Pharmacist ? ?Please check AMION for all [place] numbers ?After 10:00 PM, call [place] [phone] ? ? ?

## 2021-09-23 NOTE — Progress Notes (Addendum)
STROKE TEAM PROGRESS NOTE  ? ?INTERVAL HISTORY ?Patient is seen in his room with his wife at the bedside.  Na decreased to 153.  He has been hemodynamically stable and his neurological exam is stable. Opens eyes to noxious stimuli and is currently following commands.  Continues to have a right gaze preference with a dense left hemiplegia.  Tracheostomy placed yesterday.  Abdomen decompressed by CCM with NG tube.  Some improvement in abdominal distention.  Plan to start trickle tube feeds today.  Febrile overnight, tracheal aspirate cultured, abx initiated by CCM.  Plan for repeat head CT in the morning. ? ?Vitals:  ? 09/23/21 1000 09/23/21 1100 09/23/21 1126 09/23/21 1200  ?BP: 108/73 113/84  121/85  ?Pulse: 81 81  79  ?Resp: (!) 29 (!) 22  18  ?Temp:    (!) 100.6 ?F (38.1 ?C)  ?TempSrc:    Axillary  ?SpO2: 99% 100% 100% 95%  ?Weight:      ?Height:      ? ?CBC:  ?Recent Labs  ?Lab 09/22/21 ?0500 09/23/21 ?0302  ?WBC 12.3* 14.5*  ?HGB 9.9* 11.0*  ?HCT 30.3* 34.3*  ?MCV 90.7 90.0  ?PLT 136* 160  ? ? ?Basic Metabolic Panel:  ?Recent Labs  ?Lab 09/17/21 ?1758 09/17/21 ?2351 09/20/21 ?1043 09/21/21 ?0206 09/22/21 ?0500 09/23/21 ?0302  ?NA 155*   < >  --    < > 153* 151*  ?K  --    < >  --    < > 3.3* 3.3*  ?CL  --    < >  --    < > 118* 116*  ?CO2  --    < >  --    < > 29 26  ?GLUCOSE  --    < >  --    < > 183* 158*  ?BUN  --    < >  --    < > 40* 39*  ?CREATININE  --    < >  --    < > 1.20 1.33*  ?CALCIUM  --    < >  --    < > 8.7* 8.6*  ?MG 2.5*  --  2.6*  --   --   --   ?PHOS 2.5  --  3.1  --   --   --   ? < > = values in this interval not displayed.  ? ? ?Lipid Panel:  ?Recent Labs  ?Lab 09/18/21 ?2143  ?TRIG 212*  ? ? ?HgbA1c:  ?No results for input(s): HGBA1C in the last 168 hours. ? ?Urine Drug Screen:  ?No results for input(s): LABOPIA, COCAINSCRNUR, LABBENZ, AMPHETMU, THCU, LABBARB in the last 168 hours. ?  ?Alcohol Level No results for input(s): ETH in the last 168 hours. ? ?IMAGING past 24 hours ?DG Abd 1  View ? ?Result Date: 09/23/2021 ?CLINICAL DATA:  Ileus EXAM: ABDOMEN - 1 VIEW COMPARISON:  Multiple KUB, most recently 09/22/2021. CT AP, 10/23/2013 FINDINGS: Support lines: Small bore enteric feeding tube with tip within stomach. Multiple air-and-fluid dilated loops of small bowel. Mild air distention of colon. Overlying surgical staples.  No interval osseous abnormality. IMPRESSION: 1. Small bore enteric feeding tube within stomach. Consider advancement if transpyloric placement is desired. 2. Persistent air-and-fluid distention of small bowel, and to a lesser degree of colon. Findings consistent with reported ileus. Electronically Signed   By: Michaelle Birks M.D.   On: 09/23/2021 07:57  ? ?DG Chest Port 1 View ? ?Result Date: 09/22/2021 ?  CLINICAL DATA:  Status post tracheostomy. EXAM: PORTABLE CHEST 1 VIEW COMPARISON:  Chest x-ray from same day at 0919 hours. FINDINGS: New tracheostomy tube with tip at the thoracic inlet between the clavicular heads. Unchanged feeding tube entering the stomach with the tip below the field of view. Stable cardiomediastinal silhouette. Continued low lung volumes. Worsening aeration of the left lower lobe with increasing retrocardiac opacity and silhouetting of the left hemidiaphragm. Unchanged mild atelectasis in the medial right lung base. No pleural effusion or pneumothorax. No acute osseous abnormality. IMPRESSION: 1. New tracheostomy tube with tip at the thoracic inlet. 2. Worsening left lower lobe atelectasis versus pneumonia. Electronically Signed   By: Titus Dubin M.D.   On: 09/22/2021 15:48  ? ?DG Abd Portable 1V ? ?Result Date: 09/23/2021 ?CLINICAL DATA:  NG tube placement EXAM: PORTABLE ABDOMEN - 1 VIEW COMPARISON:  Same day radiograph FINDINGS: There is a weighted tip feeding tube with tip overlying the body of the stomach. There is a nasogastric tube with tip and side port overlying the stomach. There is diffuse gaseous distension of small and large bowel. IMPRESSION:  Weighted tip feeding tube tip overlies the body of the stomach. Nasogastric tube tip and side port overlie the stomach. Persistent diffuse gaseous distension of small and large bowel, compatible with adynamic ileus. Electronically Signed   By: Maurine Simmering M.D.   On: 09/23/2021 12:54   ? ?PHYSICAL EXAM ? ?Physical Exam  ?Constitutional: Appears well-developed and well-nourished middle-age is sedated and intubated African-American male, intubated hemi craniectomy surgical incision with clips seen on the right side ?Cardiovascular: Normal rate and regular rhythm.  ?Respiratory: Respirations synchronous with ventilator ? ?Neuro (intubated and on sedation ): Right gaze deviation, blink to threat. Corneal intact bilateral.   Right eye is swollen and difficult to open.  Following commands on the right.  PERRL,  oculocephalic reflex, cough and gag reflexes present.  ? ?ASSESSMENT/PLAN ?[name] is a 46 y.o. male with history of HTN, DM2 presenting with left side facial droop, left arm weakness. Received TNKase. Neuro status declined during MRI (NIHSS 13),taken for thrombectomy with Dr. Karenann Cai. TICI3 recanalization of the right M2/MCA achieved.  MRI/MRA shows reoccluded right M2 branch with a right MCA territory infarct.  Continue with PT/OT/SLP evals.  Home blood pressure medications resumed. BP goal less than 160. Topamax and Tylenol #3 for headache management.  Head CT shows new 84mm leftward midline shift. Trasnferred back to ICU, decompressive hemicraniectomy performed by neurosurgery, bone flap placed in abdomen.  Follow-up CT shows 5 mm left midline shift.   HTS d/c'd due to Na of 160.  Tracheostomy placed 5/1.  Now febrile, empiric antibiotic started by CCM.  Tracheal aspirate cultured and blood culture sent. ? ?Stroke:  Right MCA due to right MCA occlusion  s/p TNK and IR with TICI3, etiology not certain, but most likely cryptogenic versus intracranial atherosclerosis. ?Code Stroke CT head No  acute abnormality ?CTA head & neck R short segment M1 occlusion, left distal M1 high grade stenosis ?Post IR CT No hemorrhagic complication on flat panel CT. ?4/21- Early MRI Tiny acute infarcts in the upper division right MCA cortex. ?4/22- MRI right MCA territory infarct involving the cortex, sparing the basal ganglia.  2 mm midline shift.  No hemorrhage ?4/22- MRA reoccluded right M2 branch, intracranial atherosclerosis including advanced left M1 stenosis ?4/24-  Head CT-increased edema resulting in near complete effacement of the right lateral ventricle and 12 mm leftward midline shift ?  4/25- Head CT Post OP- 5 mm left midline shift, new area of hypoattenuation of the left middle cerebellar peduncle and cerebellum ?4/28 head CT large right MCA infarct with no HT, diminished mass effect with no midline shift, hemicraniectomy noted ?2D Echo EF > 75% ?LDL 96 ?HgbA1c 8.1 ?UDS negative ?VTE prophylaxis - SCDs ?No antithrombotic prior to admission, now on ASA 81.  ?Therapy recommendations: pending ?disposition:  Pending ? ?Cerebral Edema s/p [facility] ?Head CT 4/24 shows increased edema with new 67mm midline shift ?Decompressive hemicraniectomy performed 4/24 by neurosurgery ?Remove staples on postop day 10 ?Na 155->160->159-> 156->153 ?Off HTS->FW ?No midline shift seen on head CT 4/28 ?On keppra 500mg  bid ?Allow Na gradually trending down ? ?Intracranial stenosis ?CTA head & neck R short segment M1 occlusion, left distal M1 high grade stenosis ?No significant extracranial stenosis ?Most likely related to long standing uncontrolled risk factors. ? ?Hypertension ?Home meds:  Amlodipine 10mg , lisinopril 10mg -resumed ?Stable ?BP goal <160  ?On coreg 25, amlodipine 10, clonidine 0.2 Q8, lisinopril 40  ?PRN labetalol and hydralazine ?Long-term BP goal normotensive ? ?Hyperlipidemia ?Home meds:  Atorvastatin 20mg  ?LDL 96, goal < 70 ?Put on lipitor 40 ?Continue statin at discharge ? ?Diabetes type II Uncontrolled ?Home meds:   Glipizide, metformin ?HgbA1c 8.1, goal < 7.0 ?CBGs ?SSI with resist scale, Levemir 11->40 units twice daily ?DM coordinator consulted ? ?Other Stroke Risk Factors ?Obesity, Body mass index is 31.99 kg/m?., BMI >/

## 2021-09-23 NOTE — Evaluation (Signed)
Occupational Therapy Evaluation ?Patient Details ?Name: Jacob [name] ?MRN: [id number] ?DOB: [birth year] ?Today's Date: 09/23/2021 ? ? ?History of Present Illness 46 y.o. male presents to [facility] hospital on 09/12/2021 with L facial droop and arm weakness. CTA revealed R M1 occlusion. Pt received tNK. Pt underwent thrombectomy on 4/21. Underwent R frontotemporal parietal hemicraniectomy on 4/24 after declining 4/23.  PMH includes HTN and DM.  ? ?Clinical Impression ?  ?Savier was re-evaluated s/p the above events. He is indep at baseline and lives with his wife and children. Upon arrival pt was asleep/lethargic with eyes closed, pt's LOA did not change with any stimulation or once transferred to EOB. Overall he required total A +2 for bed mobility, sitting balance and ADLs at bed level. L extremities are flaccid, spontaneous movement of RUE but no functional movements or activation to command. Pt tolerated sitting EOB for ~10 minutes with total A+2 and RN present. Pt will benefit from OT acutely pending progression, pt and family will also benefit from family education session for PROM and positioning. Due to pt's currently functional state recommend d/c to [facility]. ?   ? ?Recommendations for follow up therapy are one component of a multi-disciplinary discharge planning process, led by the attending physician.  Recommendations may be updated based on patient status, additional functional criteria and insurance authorization.  ? ?Follow Up Recommendations ? OT at Long-term acute care hospital  ?  ?Assistance Recommended at Discharge Frequent or constant Supervision/Assistance  ?Patient can return home with the following Two people to help with walking and/or transfers;Two people to help with bathing/dressing/bathroom;Assistance with feeding;Direct supervision/assist for medications management;Direct supervision/assist for financial management;Assist for transportation;Help with stairs or ramp for entrance ? ?  ?Functional Status  Assessment ? Patient has had a recent decline in their functional status and/or demonstrates limited ability to make significant improvements in function in a reasonable and predictable amount of time  ?Equipment Recommendations ? Hospital bed;Wheelchair cushion (measurements OT);Wheelchair (measurements OT)  ?  ?   ?Precautions / Restrictions Precautions ?Precautions: Fall ?Precaution Comments: trach, intubated, R crani with bone flap in abdomen ?Restrictions ?Weight Bearing Restrictions: No  ? ?  ? ?Mobility Bed Mobility ?Overal bed mobility: Needs Assistance ?Bed Mobility: Rolling, Sidelying to Sit, Sit to Supine ?Rolling: +2 for physical assistance, +2 for safety/equipment, Total assist ?Sidelying to sit: Total assist, +2 for physical assistance, +2 for safety/equipment ?  ?Sit to supine: Total assist, +2 for physical assistance, +2 for safety/equipment ?  ?General bed mobility comments: +3 helpful ?  ? ?Transfers ?  ?  ?  ?  ? ?  ?Balance Overall balance assessment: Needs assistance ?Sitting-balance support: Feet supported ?Sitting balance-Leahy Scale: Zero ?Sitting balance - Comments: total A for sitting balance ?  ?  ?  ?  ?  ?  ?   ? ?ADL either performed or assessed with clinical judgement  ? ?ADL Overall ADL's : Needs assistance/impaired ?  ?  ?  ?  ?General ADL Comments: total A +2 for all aspects of care  ? ? ? ?Vision Baseline Vision/History: 0 No visual deficits ?Vision Assessment?: Vision impaired- to be further tested in functional context ?Additional Comments: eyes closed 100% of the session  ?   ?   ?   ? ?Pertinent Vitals/Pain Pain Assessment ?Pain Assessment: Faces ?Faces Pain Scale: No hurt ?Pain Intervention(s): Monitored during session  ? ? ? ?Hand Dominance Right ?  ?Extremity/Trunk Assessment Upper Extremity Assessment ?Upper Extremity Assessment: LUE deficits/detail;RUE deficits/detail ?RUE Deficits /  Details: spontaneous movements noted, not to command or funcitonal ?LUE Deficits /  Details: flaccid, increased edema ?LUE Sensation: decreased proprioception;decreased light touch ?LUE Coordination: decreased fine motor;decreased gross motor ?  ?Lower Extremity Assessment ?Lower Extremity Assessment: Defer to PT evaluation ?  ?Cervical / Trunk Assessment ?Cervical / Trunk Assessment: Kyphotic;Other exceptions ?Cervical / Trunk Exceptions: trach, crani ?  ?Communication Communication ?Communication: Expressive difficulties;Tracheostomy ?  ?Cognition Arousal/Alertness: Lethargic ?Behavior During Therapy: Flat affect ?Overall Cognitive Status: Difficult to assess ?  ?  ?  ?  ?  ?General Comments: pt's eyes closed 100% of the session, no command following, did not increase LOA once sitting EOB or to any stimulation ?  ?  ?General Comments  vitals stable, all lines and tube intact. RN present during session ? ?  ?   ?   ? ? ?Home Living Family/patient expects to be discharged to:: Private residence ?Living Arrangements: Spouse/significant other;Children ?Available Help at Discharge: Family;Available 24 hours/day ?Type of Home: House ?Home Access: Stairs to enter ?Entrance Stairs-Number of Steps: 1 ?Entrance Stairs-Rails: None ?Home Layout: Two level;Able to live on main level with bedroom/bathroom ?Alternate Level Stairs-Number of Steps: full flight ?Alternate Level Stairs-Rails: Left ?Bathroom Shower/Tub: Gaffer;Tub/shower unit ?  ?Bathroom Toilet: Standard ?Bathroom Accessibility: Yes ?How Accessible: Accessible via walker ?Home Equipment: None ?  ?  ? Lives With: Spouse;Family ? ?  ?Prior Functioning/Environment Prior Level of Function : Independent/Modified Independent;Working/employed;Driving ?  ?  ?  ?  ?  ?  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  ? ?  ?  ?OT Problem List: Decreased strength;Decreased range of motion;Decreased activity tolerance;Impaired balance (sitting and/or standing);Impaired vision/perception;Decreased  coordination;Decreased cognition;Decreased safety awareness;Decreased knowledge of use of DME or AE;Decreased knowledge of precautions;Cardiopulmonary status limiting activity;Impaired sensation;Impaired tone;Impaired UE functional use;Increased edema;Pain ?  ?   ?OT Treatment/Interventions: Therapeutic exercise;Self-care/ADL training;DME and/or AE instruction;Therapeutic activities;Patient/family education;Balance training;Splinting  ?  ?OT Goals(Current goals can be found in the care plan section) Acute Rehab OT Goals ?Patient Stated Goal: unable to state ?OT Goal Formulation: Patient unable to participate in goal setting ?Time For Goal Achievement: 09/26/21 ?Potential to Achieve Goals: Good ?ADL Goals ?Additional ADL Goal #1: Pt will maintain sitting balance with mod A as a precursor to EOB ADLs ?Additional ADL Goal #2: pt will obtain grooming items with RUE with min VCs to assist in ADLs  ?OT Frequency: Min 1X/week ?  ? ?Co-evaluation PT/OT/SLP Co-Evaluation/Treatment: Yes ?Reason for Co-Treatment: Complexity of the patient's impairments (multi-system involvement);Necessary to address cognition/behavior during functional activity;For patient/therapist safety;To address functional/ADL transfers ?  ?OT goals addressed during session: ADL's and self-care ?  ? ?  ?AM-PAC OT "6 Clicks" Daily Activity     ?Outcome Measure Help from another person eating meals?: Total ?Help from another person taking care of personal grooming?: Total ?Help from another person toileting, which includes using toliet, bedpan, or urinal?: Total ?Help from another person bathing (including washing, rinsing, drying)?: Total ?Help from another person to put on and taking off regular upper body clothing?: Total ?Help from another person to put on and taking off regular lower body clothing?: Total ?6 Click Score: 6 ?  ?End of Session Nurse Communication: Mobility status ? ?Activity Tolerance: Patient limited by lethargy ?Patient left: in  bed;with call bell/phone within reach;with bed alarm set ? ?OT Visit Diagnosis: Unsteadiness on feet (R26.81);Other abnormalities of gait and mobility (R26.89);Muscle weakness (generalized) (M62.81);Feeding difficulties (

## 2021-09-24 ENCOUNTER — Inpatient Hospital Stay (HOSPITAL_COMMUNITY): Payer: BC Managed Care – PPO

## 2021-09-24 DIAGNOSIS — I63411 Cerebral infarction due to embolism of right middle cerebral artery: Secondary | ICD-10-CM | POA: Diagnosis not present

## 2021-09-24 DIAGNOSIS — J9601 Acute respiratory failure with hypoxia: Secondary | ICD-10-CM | POA: Diagnosis not present

## 2021-09-24 DIAGNOSIS — D72829 Elevated white blood cell count, unspecified: Secondary | ICD-10-CM

## 2021-09-24 DIAGNOSIS — I63511 Cerebral infarction due to unspecified occlusion or stenosis of right middle cerebral artery: Secondary | ICD-10-CM | POA: Diagnosis not present

## 2021-09-24 DIAGNOSIS — I639 Cerebral infarction, unspecified: Secondary | ICD-10-CM | POA: Diagnosis not present

## 2021-09-24 DIAGNOSIS — Z430 Encounter for attention to tracheostomy: Secondary | ICD-10-CM

## 2021-09-24 DIAGNOSIS — Z9911 Dependence on respirator [ventilator] status: Secondary | ICD-10-CM | POA: Diagnosis not present

## 2021-09-24 DIAGNOSIS — G936 Cerebral edema: Secondary | ICD-10-CM | POA: Diagnosis not present

## 2021-09-24 LAB — BASIC METABOLIC PANEL
Anion gap: 12 (ref 5–15)
BUN: 63 mg/dL — ABNORMAL HIGH (ref 6–20)
CO2: 24 mmol/L (ref 22–32)
Calcium: 8.7 mg/dL — ABNORMAL LOW (ref 8.9–10.3)
Chloride: 114 mmol/L — ABNORMAL HIGH (ref 98–111)
Creatinine, Ser: 1.68 mg/dL — ABNORMAL HIGH (ref 0.61–1.24)
GFR, Estimated: 50 mL/min — ABNORMAL LOW (ref >60)
Glucose, Bld: 221 mg/dL — ABNORMAL HIGH (ref 70–99)
Potassium: 3.3 mmol/L — ABNORMAL LOW (ref 3.5–5.1)
Sodium: 150 mmol/L — ABNORMAL HIGH (ref 135–145)

## 2021-09-24 LAB — CBC
HCT: 31.9 % — ABNORMAL LOW (ref 39.0–52.0)
Hemoglobin: 10.4 g/dL — ABNORMAL LOW (ref 13.0–17.0)
MCH: 28.7 pg (ref 26.0–34.0)
MCHC: 32.6 g/dL (ref 30.0–36.0)
MCV: 87.9 fL (ref 80.0–100.0)
Platelets: UNDETERMINED 10*3/uL (ref 150–400)
RBC: 3.63 MIL/uL — ABNORMAL LOW (ref 4.22–5.81)
RDW: 14.2 % (ref 11.5–15.5)
WBC: 18.5 10*3/uL — ABNORMAL HIGH (ref 4.0–10.5)
nRBC: 0 % (ref 0.0–0.2)

## 2021-09-24 LAB — CULTURE, RESPIRATORY W GRAM STAIN

## 2021-09-24 LAB — GLUCOSE, CAPILLARY
Glucose-Capillary: 212 mg/dL — ABNORMAL HIGH (ref 70–99)
Glucose-Capillary: 204 mg/dL — ABNORMAL HIGH (ref 70–99)
Glucose-Capillary: 187 mg/dL — ABNORMAL HIGH (ref 70–99)
Glucose-Capillary: 171 mg/dL — ABNORMAL HIGH (ref 70–99)
Glucose-Capillary: 163 mg/dL — ABNORMAL HIGH (ref 70–99)

## 2021-09-24 IMAGING — CT CT HEAD W/O CM
4 series · 16 of 47 positions shown, 18 images · non-contrast
Comparison: [DATE]

CLINICAL DATA: Follow-up stroke, subsequent encounter



[Series 3: head without · axial · non-contrast · 0.49mm/px · z∈[-364,-224]mm · 7 of 38 slices shown, 9 images]
[im 5/38  brain]
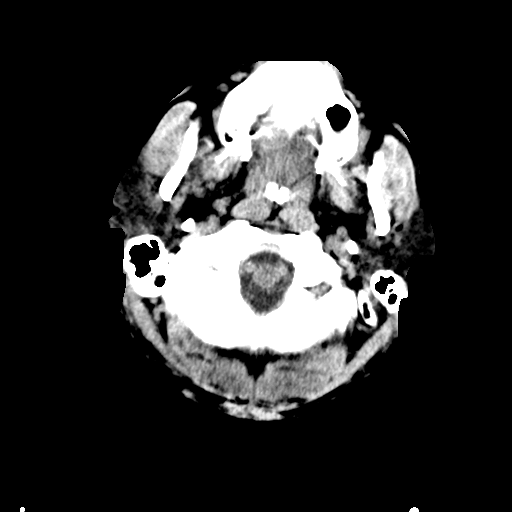
[im 5/38  bone]
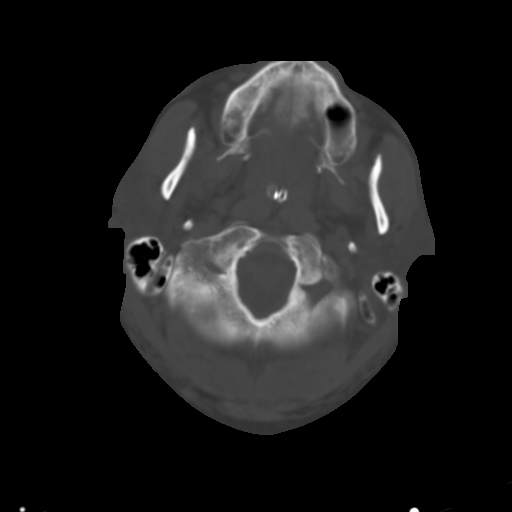
[im 10/38  brain]
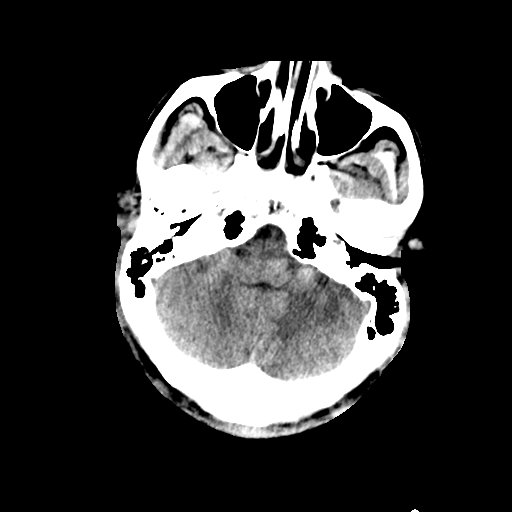
[im 14/38  brain]
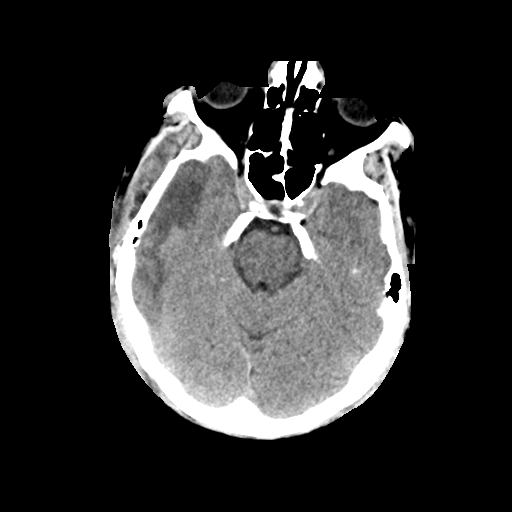
[im 19/38  brain]
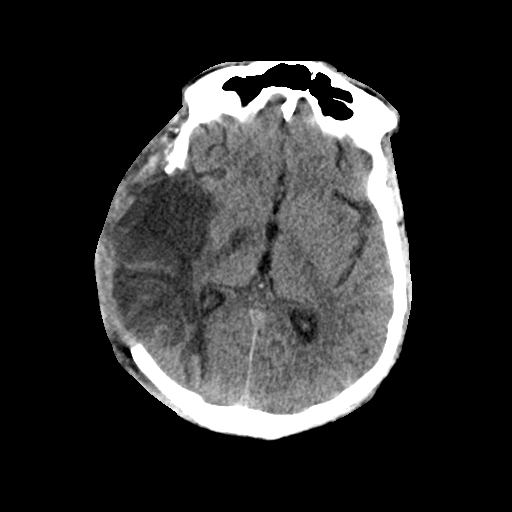
[im 24/38  brain]
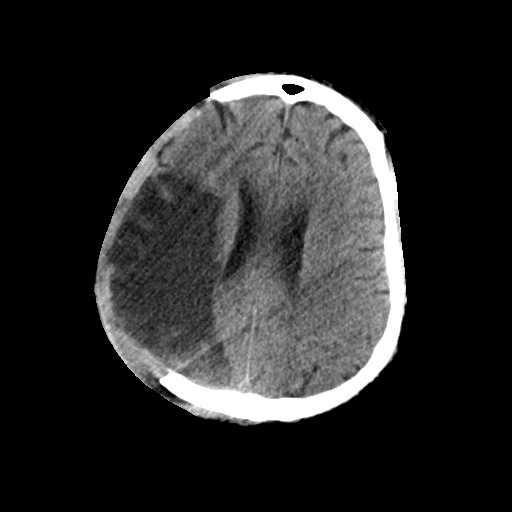
[im 24/38  bone]
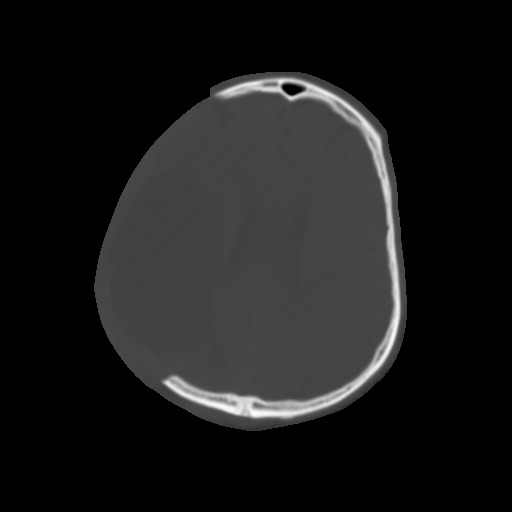
[im 28/38  brain]
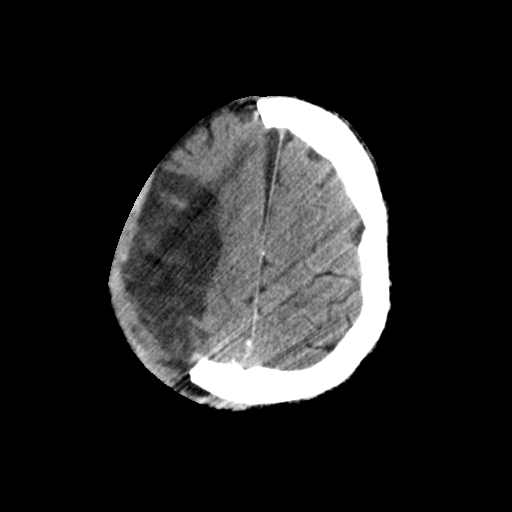
[im 33/38  brain]
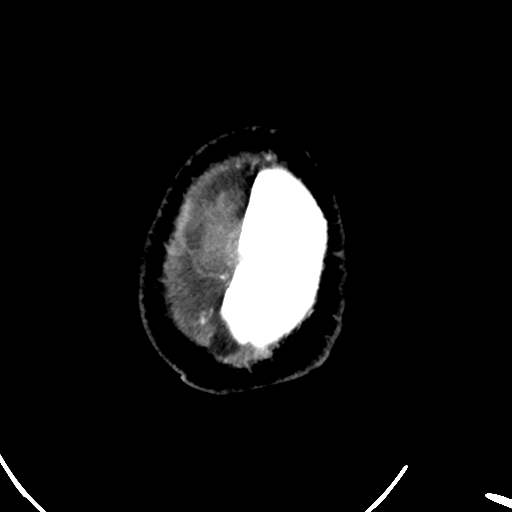

[Series 4: head bone · axial · 0.49mm/px · z∈[-366,-330]mm · 3 of 94 slices shown]
[im 10/94  bone]
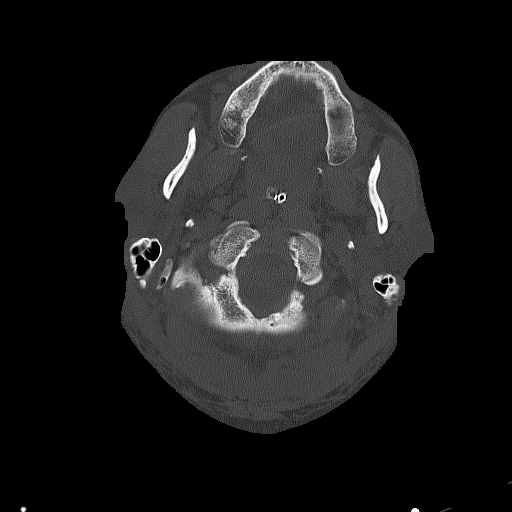
[im 19/94  bone]
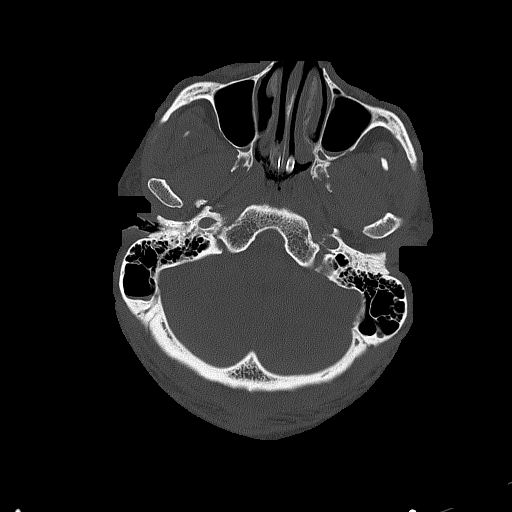
[im 28/94  bone]
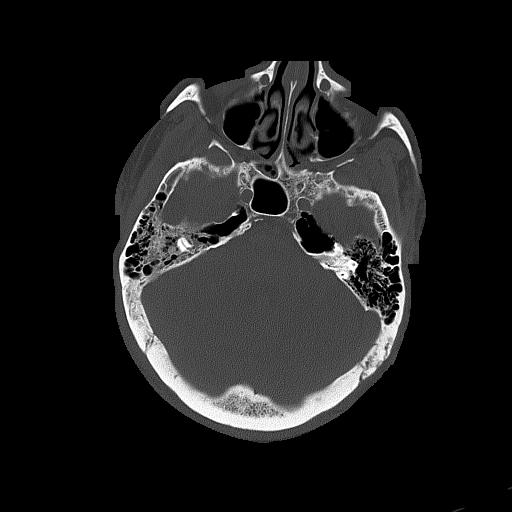

[Series 5: head without cor · coronal · non-contrast · 0.38mm/px · 3 of 75 slices shown]
[im 25/75  brain]
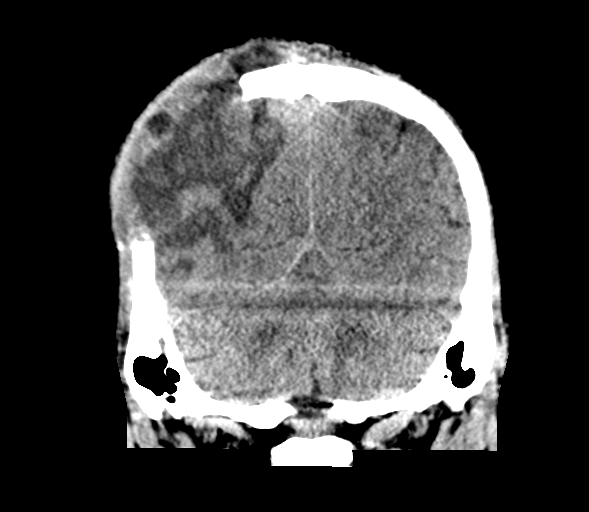
[im 33/75  brain]
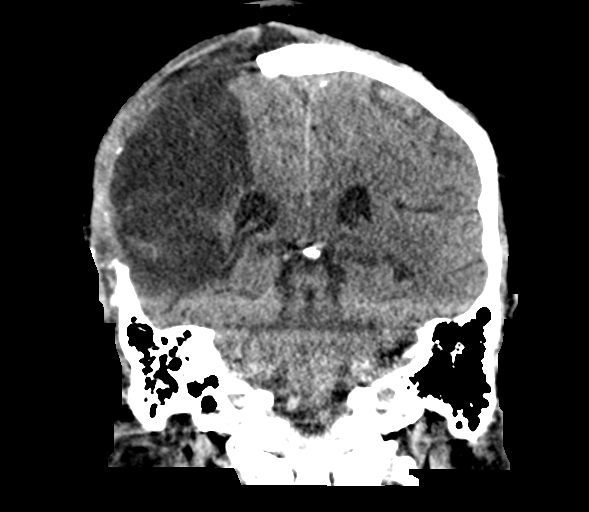
[im 42/75  brain]
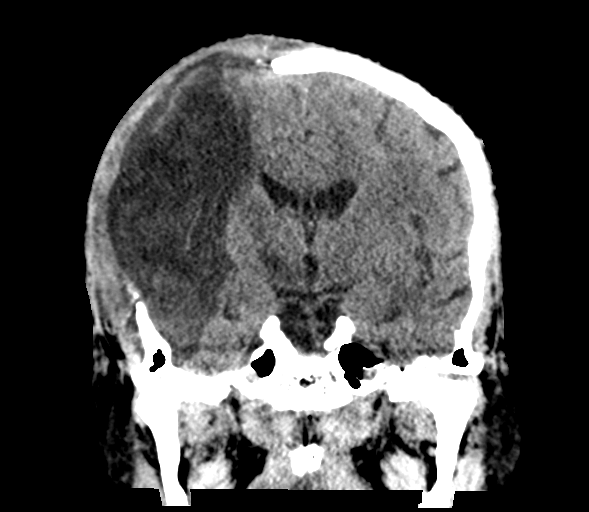

[Series 6: head without sag · sagittal · non-contrast · 0.40mm/px · 3 of 67 slices shown]
[im 23/67  brain]
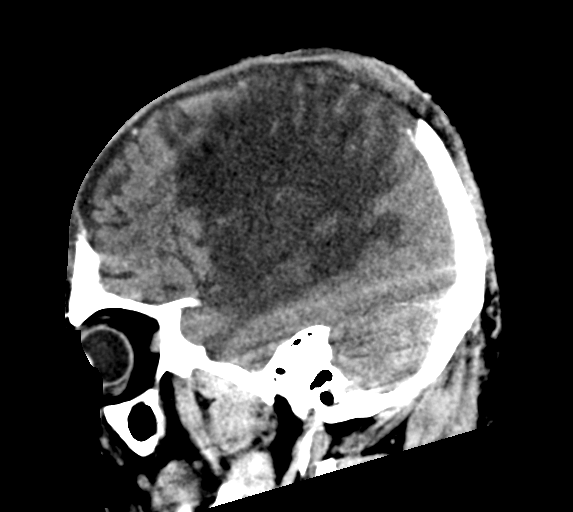
[im 34/67  brain]
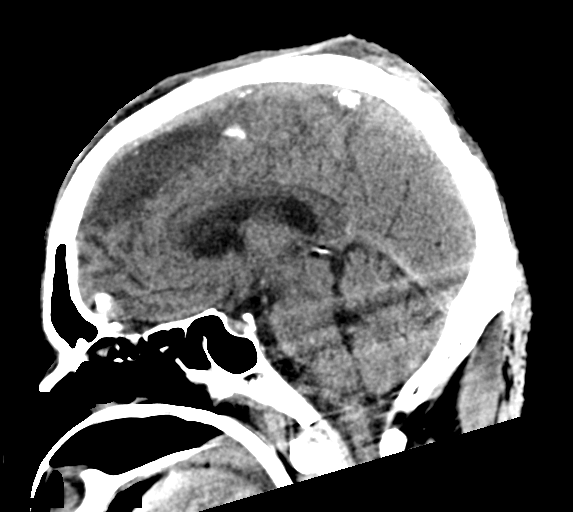
[im 45/67  brain]
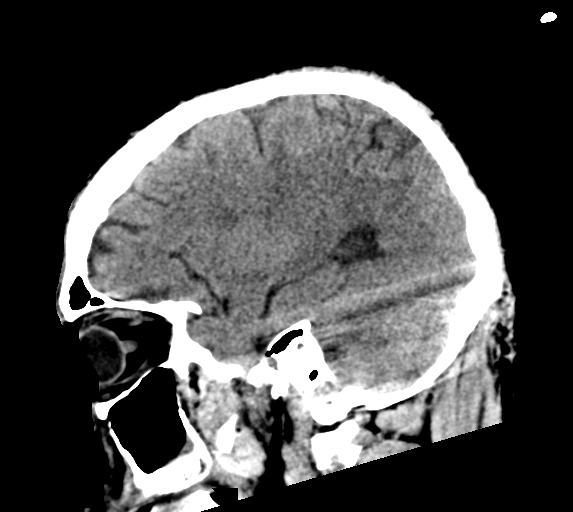

[16 of 47 positions shown; findings below may reference images not displayed]

FINDINGS: Brain: Diffuse cytotoxic edema is noted throughout the distribution
of the right MCA and PCA watershed region similar to that seen on
the prior exam. No parenchymal hemorrhage is seen. No new focal
infarct is seen.

Vascular: No hyperdense vessel or unexpected calcification.

Skull: Large calvarial defect there is noted on the right stable
from the prior study.

Sinuses/Orbits: Paranasal sinuses are within normal limits.

Other: Gastric catheter and feeding catheter are noted extending
through the nose.
IMPRESSION: Right MCA/PCA infarct stable in appearance from the previous exam.
No acute hemorrhage is noted.

Right-sided hemi craniectomy stable from the prior exam. No new
focal abnormality is noted.

## 2021-09-24 IMAGING — DX DG ABDOMEN 1V
1 series · 1 of 1 positions shown · non-contrast
Comparison: [DATE].

CLINICAL DATA: Abdominal distension.  Feeding tube position.

EXAM:
ABDOMEN - 1 VIEW

[abdomen kub]
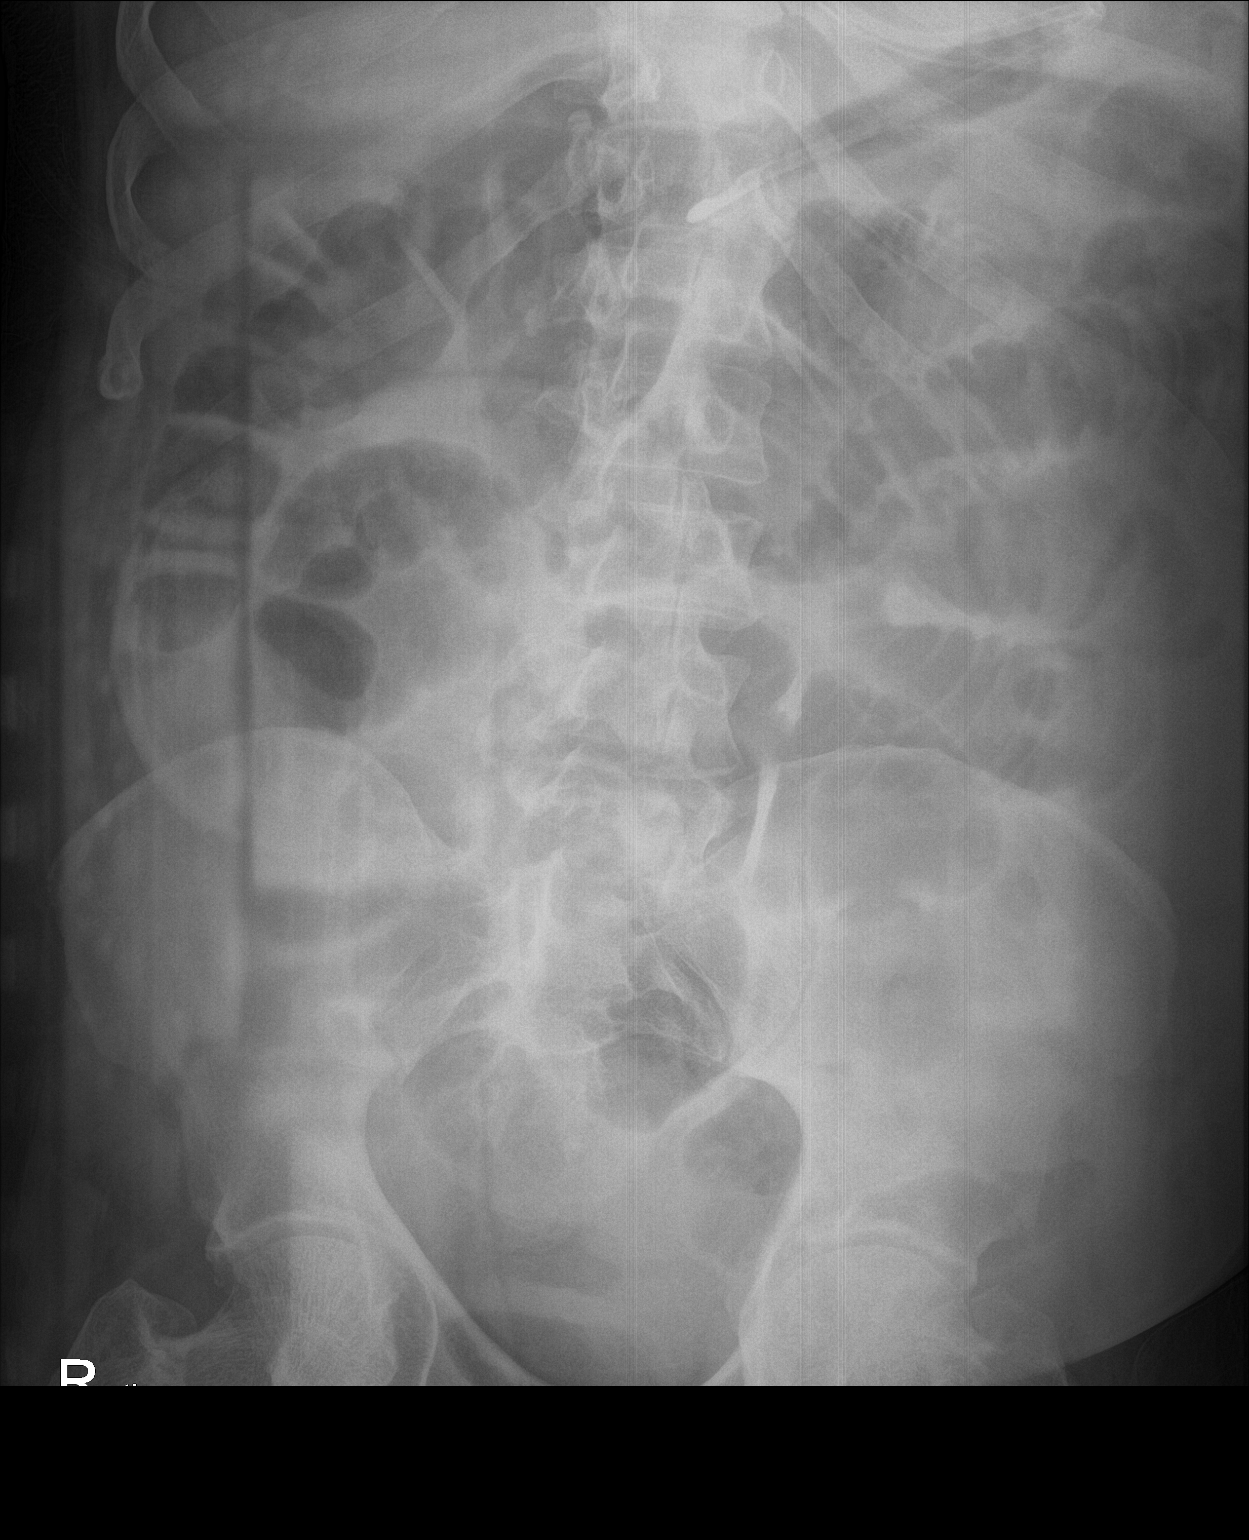

[1 of 1 positions shown; findings below may reference images not displayed]

FINDINGS: Feeding tube terminates in the gastric antrum. Gaseous distention of
small bowel and colon with gas in the rectum.
IMPRESSION: 1. Feeding tube terminates in the gastric antrum, unchanged.
2. Gaseous distention of small bowel and colon with gas in the
rectum, similar to yesterday and indicative of an ileus.

## 2021-09-24 MED ORDER — POTASSIUM CHLORIDE 10 MEQ/100ML IV SOLN
10.0000 meq | INTRAVENOUS | Status: AC
  Administered 2021-09-24 (×4): 10 meq via INTRAVENOUS
  Filled 2021-09-24: qty 100

## 2021-09-24 MED ORDER — SODIUM CHLORIDE 0.9 % IV SOLN: INTRAVENOUS | Status: DC

## 2021-09-24 MED ORDER — VALACYCLOVIR HCL 500 MG PO TABS
1000.0000 mg | ORAL_TABLET | Freq: Three times a day (TID) | ORAL | Status: DC
  Administered 2021-09-24 (×2): 1000 mg
  Filled 2021-09-24 (×3): qty 2

## 2021-09-24 MED ORDER — POTASSIUM CHLORIDE 10 MEQ/100ML IV SOLN: 10.0000 meq | INTRAVENOUS | Status: DC

## 2021-09-24 MED ORDER — POTASSIUM CHLORIDE 20 MEQ PO PACK
20.0000 meq | PACK | ORAL | Status: DC
  Administered 2021-09-24: 20 meq
  Filled 2021-09-24: qty 1

## 2021-09-24 MED ORDER — VANCOMYCIN HCL 1500 MG/300ML IV SOLN
1500.0000 mg | INTRAVENOUS | Status: DC
  Administered 2021-09-24: 1500 mg via INTRAVENOUS
  Filled 2021-09-24 (×2): qty 300

## 2021-09-24 MED ORDER — SODIUM CHLORIDE 0.9 % IV BOLUS
1000.0000 mL | Freq: Once | INTRAVENOUS | Status: AC
  Administered 2021-09-24: 1000 mL via INTRAVENOUS

## 2021-09-24 MED ORDER — CLONIDINE HCL 0.2 MG PO TABS
0.3000 mg | ORAL_TABLET | Freq: Three times a day (TID) | ORAL | Status: DC
  Administered 2021-09-24 – 2021-09-25 (×3): 0.3 mg
  Filled 2021-09-24 (×3): qty 1

## 2021-09-24 MED ORDER — LACTATED RINGERS IV SOLN: INTRAVENOUS | Status: DC

## 2021-09-24 MED ORDER — INSULIN DETEMIR 100 UNIT/ML ~~LOC~~ SOLN
32.0000 [IU] | Freq: Two times a day (BID) | SUBCUTANEOUS | Status: DC
  Administered 2021-09-24 – 2021-09-28 (×9): 32 [IU] via SUBCUTANEOUS
  Filled 2021-09-24 (×12): qty 0.32

## 2021-09-24 NOTE — Progress Notes (Signed)
[facility] ADULT ICU REPLACEMENT PROTOCOL ? ? ?The patient does apply for the [facility] Adult ICU Electrolyte Replacment Protocol based on the criteria listed below:  ? ?1.Exclusion criteria: TCTS patients, ECMO patients, and Dialysis patients ?2. Is GFR >/= 30 ml/min? Yes.    ?Patient's GFR today is 50 ?3. Is SCr </= 2? Yes.   ?Patient's SCr is 1.68 mg/dL ?4. Did SCr increase >/= 0.5 in 24 hours? No. ?5.Pt's weight >40kg  Yes.   ?6. Abnormal electrolyte(s): K+ 3.3  ?7. Electrolytes replaced per protocol ?8.  Call MD STAT for K+ </= 2.5, Phos </= 1, or Mag </= 1 ?Physician:  n/a ? ?Darlys Gales 09/24/2021 6:23 AM ? ?

## 2021-09-24 NOTE — Progress Notes (Addendum)
STROKE TEAM PROGRESS NOTE  ? ?INTERVAL HISTORY ?Patient is seen in his room with his wife at the bedside.  Na decreased to 150.  His neurological exam is stable and he has been hemodynamically stable but still febrile.  Right hemicraniectomy flap is soft and full. ? ?Vitals:  ? 09/24/21 1000 09/24/21 1100 09/24/21 1113 09/24/21 1200  ?BP: (!) 146/92 (!) 144/100  (!) 148/97  ?Pulse: 84 82 82 83  ?Resp: (!) 30 (!) 31 (!) 33 (!) 25  ?Temp:    (!) 100.7 ?F (38.2 ?C)  ?TempSrc:    Axillary  ?SpO2: 98% 93% 99% 93%  ?Weight:      ?Height:      ? ?CBC:  ?Recent Labs  ?Lab 09/23/21 ?0302 09/24/21 ?[id number]  ?WBC 14.5* 18.5*  ?HGB 11.0* 10.4*  ?HCT 34.3* 31.9*  ?MCV 90.0 87.9  ?PLT 160 PLATELET CLUMPS NOTED ON SMEAR, UNABLE TO ESTIMATE  ? ? ?Basic Metabolic Panel:  ?Recent Labs  ?Lab 09/17/21 ?1758 09/17/21 ?2351 09/20/21 ?1043 09/21/21 ?0206 09/23/21 ?0302 09/24/21 ?0459  ?NA 155*   < >  --    < > 151* 150*  ?K  --    < >  --    < > 3.3* 3.3*  ?CL  --    < >  --    < > 116* 114*  ?CO2  --    < >  --    < > 26 24  ?GLUCOSE  --    < >  --    < > 158* 221*  ?BUN  --    < >  --    < > 39* 63*  ?CREATININE  --    < >  --    < > 1.33* 1.68*  ?CALCIUM  --    < >  --    < > 8.6* 8.7*  ?MG 2.5*  --  2.6*  --   --   --   ?PHOS 2.5  --  3.1  --   --   --   ? < > = values in this interval not displayed.  ? ? ?Lipid Panel:  ?Recent Labs  ?Lab 09/18/21 ?2143  ?TRIG 212*  ? ? ?HgbA1c:  ?No results for input(s): HGBA1C in the last 168 hours. ? ?Urine Drug Screen:  ?No results for input(s): LABOPIA, COCAINSCRNUR, LABBENZ, AMPHETMU, THCU, LABBARB in the last 168 hours. ?  ?Alcohol Level No results for input(s): ETH in the last 168 hours. ? ?IMAGING past 24 hours ?DG Abd 1 View ? ?Result Date: 09/24/2021 ?CLINICAL DATA:  Abdominal distension.  Feeding tube position. EXAM: ABDOMEN - 1 VIEW COMPARISON:  09/23/2021. FINDINGS: Feeding tube terminates in the gastric antrum. Gaseous distention of small bowel and colon with gas in the rectum. IMPRESSION:  1. Feeding tube terminates in the gastric antrum, unchanged. 2. Gaseous distention of small bowel and colon with gas in the rectum, similar to yesterday and indicative of an ileus. Electronically Signed   By: Lorin Picket M.D.   On: 09/24/2021 10:16  ? ?CT HEAD WO CONTRAST (5MM) ? ?Result Date: 09/24/2021 ?CLINICAL DATA:  Follow-up stroke, subsequent encounter EXAM: CT HEAD WITHOUT CONTRAST TECHNIQUE: Contiguous axial images were obtained from the base of the skull through the vertex without intravenous contrast. RADIATION DOSE REDUCTION: This exam was performed according to the departmental dose-optimization program which includes automated exposure control, adjustment of the mA and/or kV according to patient size and/or use of iterative reconstruction  technique. COMPARISON:  09/19/2021 FINDINGS: Brain: Diffuse cytotoxic edema is noted throughout the distribution of the right MCA and PCA watershed region similar to that seen on the prior exam. No parenchymal hemorrhage is seen. No new focal infarct is seen. Vascular: No hyperdense vessel or unexpected calcification. Skull: Large calvarial defect there is noted on the right stable from the prior study. Sinuses/Orbits: Paranasal sinuses are within normal limits. Other: Gastric catheter and feeding catheter are noted extending through the nose. IMPRESSION: Right MCA/PCA infarct stable in appearance from the previous exam. No acute hemorrhage is noted. Right-sided hemi craniectomy stable from the prior exam. No new focal abnormality is noted. Electronically Signed   By: Inez Catalina M.D.   On: 09/24/2021 02:47   ? ?PHYSICAL EXAM ? ?Physical Exam  ?Constitutional: Appears well-developed and well-nourished middle-age is sedated and intubated African-American male, intubated hemi craniectomy surgical incision with clips seen on the right side ?Cardiovascular: Normal rate and regular rhythm.  ?Respiratory: Respirations synchronous with ventilator ? ?Neuro (intubated  without sedation): Right gaze deviation, blink to threat. Corneal intact bilateral.   Following commands on the right.  PERRL,  oculocephalic reflex, cough and gag reflexes present.  ? ?ASSESSMENT/PLAN ?Jacob [name] is a 46 y.o. male with history of HTN, DM2 presenting with left side facial droop, left arm weakness. Received TNKase. Neuro status declined during MRI (NIHSS 13),taken for thrombectomy with Dr. Karenann Cai. TICI3 recanalization of the right M2/MCA achieved.  MRI/MRA shows reoccluded right M2 branch with a right MCA territory infarct.  Continue with PT/OT/SLP evals.  Home blood pressure medications resumed. BP goal less than 160. Topamax and Tylenol #3 for headache management.  Head CT shows new 67mm leftward midline shift. Trasnferred back to ICU, decompressive hemicraniectomy performed by neurosurgery, bone flap placed in abdomen.  Follow-up CT shows 5 mm left midline shift.   HTS d/c'd due to Na of 160.  Tracheostomy placed 5/1.  Now febrile, empiric antibiotic started by CCM.  Tracheal aspirate cultured and blood culture sent. ? ?Stroke:  Right MCA due to right MCA occlusion  s/p TNK and IR with TICI3, etiology not certain, but most likely cryptogenic versus intracranial atherosclerosis. ?Code Stroke CT head No acute abnormality ?CTA head & neck R short segment M1 occlusion, left distal M1 high grade stenosis ?Post IR CT No hemorrhagic complication on flat panel CT. ?4/21- Early MRI Tiny acute infarcts in the upper division right MCA cortex. ?4/22- MRI right MCA territory infarct involving the cortex, sparing the basal ganglia.  2 mm midline shift.  No hemorrhage ?4/22- MRA reoccluded right M2 branch, intracranial atherosclerosis including advanced left M1 stenosis ?4/24-  Head CT-increased edema resulting in near complete effacement of the right lateral ventricle and 12 mm leftward midline shift ?4/25- Head CT Post OP- 5 mm left midline shift, new area of hypoattenuation of the left  middle cerebellar peduncle and cerebellum ?4/28 head CT large right MCA infarct with no HT, diminished mass effect with no midline shift, hemicraniectomy noted ?5/3 CT head stable, no midline shift, large right MCA infarct ?2D Echo EF > 75% ?LDL 96 ?HgbA1c 8.1 ?UDS negative ?VTE prophylaxis - SCDs ?No antithrombotic prior to admission, now on ASA 81.  ?Therapy recommendations: PT/OT at [facility] ?disposition:  Pending ? ?Cerebral Edema s/p [facility] ?Head CT 4/24 shows increased edema with new 8mm midline shift ?Decompressive hemicraniectomy performed 4/24 by neurosurgery ?Remove staples on postop day 10 ?Na 155->160->159-> 156->153-> 150 ?Off HTS->FW->LR @ 100 ?No midline  shift seen on head CT 4/28 ?On keppra 500mg  bid ?Allow Na gradually trending down ? ?Intracranial stenosis ?CTA head & neck R short segment M1 occlusion, left distal M1 high grade stenosis ?No significant extracranial stenosis ?Most likely related to long standing uncontrolled risk factors. ? ?Hypertension ?Home meds:  Amlodipine 10mg , lisinopril 10mg -resumed ?Stable ?BP goal <160  ?On coreg 25, amlodipine 10, clonidine 0.2 Q8, lisinopril 40  ?PRN labetalol and hydralazine ?Long-term BP goal normotensive ? ?Hyperlipidemia ?Home meds:  Atorvastatin 20mg  ?LDL 96, goal < 70 ?Put on lipitor 40 ?Continue statin at discharge ? ?Diabetes type II Uncontrolled ?Home meds:  Glipizide, metformin ?HgbA1c 8.1, goal < 7.0 ?CBGs ?SSI with resist scale, Levemir 11->40 units twice daily ?DM coordinator consulted ? ?Other Stroke Risk Factors ?Obesity, Body mass index is 31.99 kg/m?., BMI >/= 30 associated with increased stroke risk, recommend weight loss, diet and exercise as appropriate  ? ?Dysphagia  ?N.p.o. postop, still intubated ?Cortrak in place ?On TF @ 60 and FW 200 Q4 ? ?Respiratory failure ?Patient left intubated after surgery ?Ventilator management per CCM ?Tracheostomy placed 5/1 ?On weaning ? ?Ileus ?Bowel regimen in place with fiber supplement ?Gastric tube for  decompression per CCM ?KUB 5/3 unchanged ? ?Fever and leukocytosis ?Tmax 101.5 ?WBC 12.3-14.5-18.5 ?Tracheal aspirate + MRSA ?On vancomycin ? ?Hospital day # 12 ? ?Patient seen and examined by NP/APP with MD

## 2021-09-24 NOTE — Progress Notes (Signed)
Patient transported from 4N21 to CT and back with no complications. °

## 2021-09-24 NOTE — Progress Notes (Signed)
? ?NAME:  Jacob [name], MRN:  [id number], DOB:  [birth year], LOS: 12 ?ADMISSION DATE:  09/12/2021, CONSULTATION DATE:  09/15/2021 ?REFERRING MD:  Leonie Man - Stroke, CHIEF COMPLAINT:  Cerebral edema.   ? ?History of Present Illness:  ?46 year old man with worsening cerebral edema. ? ?He presented 4/21 with left-sided weakness which was initially waxing and waning. CTA showed right M1 occlusion.  Further decline post TNK brought for thrombectomy at TICI 3 revascularization of right M2. ?Transfer to floor but experienced neurological decline 4/24.  CT showed malignant cerebral edema with 12 mm right to left midline shift and Trapping of the temporal horn. ? ?He was brought back to the ICU and started on 3% hypertonic saline and prepped for the operating room.  Platelets were transfused to offset effect of clopidogrel and aspirin. ? ?Taken to OR and underwent right frontotemporal parietal hemicraniectomy with placement of bone flap in abdominal subcutaneous tissue. Returned to ICU on vent. ? ?Pertinent  Medical History  ? ?Past Medical History:  ?Diagnosis Date  ? Allergy   ? Diabetes mellitus without complication ([place])   ? Hypertension   ? ?Significant Hospital Events: ?Including procedures, antibiotic start and stop dates in addition to other pertinent events   ?4/24 decompressive craniectomy. ?4/25 decreased midline shift on CT ?4/26 still on HT saline.  Switched from prop to precedex. BP meds titrated up in effort to decrease cleviprex. Cortrak placed.  ?4/27 Cleviprex maxed out, started on labetolol gtt. Started scheduled clonidine, also started coreg. Foley replaced and started on doxazosin due to urinary retention. HT infusion cut in half w/ plan to work towards stopping. Stopped fent gtt. Changed to PRN w/ plan to start precedex if needed.  Checking [place] of UE to eval fever. Also sending PCT ?5/1 trached, c/o pain upper abd, tmax 101.3, diarrhea overnight ?5/2 febrile 102.7, WBC up, c/o ongoing abd pain/distention, KUB  c/w ileus, cultured and started on vanc/ cefepime, NGT placed for decompression. ? ?Interim History / Subjective:  ?Febrile 101.5 ?sCr up, UOP down ?973ml out NGT, still have some abd pain but x 1 stool overnight ?Staph growing from trach asp, pending susceptibilities  ?CTH yesterday> stable R MCA/ PCA infarct w/ diffuse cytotoxic edema in area, stable right hemi-crani, sinuses wnl> stable exam  ? ?Objective   ?Blood pressure 137/87, pulse 89, temperature (!) 101.5 ?F (38.6 ?C), temperature source Axillary, resp. rate 15, height 6\' 1"  (1.854 m), weight 110 kg, SpO2 97 %. ?   ?Vent Mode: PSV;CPAP ?FiO2 (%):  [30 %] 30 % ?Set Rate:  [18 bmp] 18 bmp ?Vt Set:  [id number] mL] 630 mL ?PEEP:  [5 cmH20] 5 cmH20 ?Pressure Support:  [8 cmH20-10 cmH20] 10 cmH20 ?Plateau Pressure:  [19 cmH20] 19 cmH20  ? ?Intake/Output Summary (Last 24 hours) at 09/24/2021 0946 ?Last data filed at 09/24/2021 0900 ?Gross per 24 hour  ?Intake 1419.86 ml  ?Output 1900 ml  ?Net -480.14 ml  ? ?Filed Weights  ? 09/17/21 0403 09/20/21 0500 09/21/21 0500  ?Weight: 105.1 kg 109.9 kg 110 kg  ? ?Examination: ?General:  ill appearing adult male lying in bed in NAD ?HEENT: MM pink/dry, crani flap intact, non-bulging, cortrak and NGT with bilious outpt, midline cuffed sutured trach- site wnl ?Neuro:  will open eyes and f/c on right, left remains hemiplegic  ?CV: rr, NSR, no murmur ?PULM:  non labored on PSV 8/5, coarse no wheeze, tan secretions ?GI: distended, +bs, purwick -amber urine ?Extremities: warm/dry, mild generalized UE/ pedal edema  ?  Skin: slightly improved pustular rash across anterior upper torso and left arm some vesicular > few on right arm ? ?UOP 1L/ 24 hrs + 1 unmeasured  ?1 BM overnight ? ?Na+  153> 151> 150 ?K+ 3.3 ?Cl 116> 114 ?BUN 39> 63 ?Cr 1.2> 1.33> 1.68 ?WBC 12.3 > 14.5> 18.5 ?H/H 9.9/30.3 > 11/ 34.3 > 10.4/ 31.9 ?Platelets 136 > 160> clumps ? ?BG 172> 241 ? ?Assessment & Plan:  ?Active Problems: ?  Diabetes mellitus without complication  ([place]) ?  Essential hypertension ?  Hyperlipidemia ?  Cerebral edema (HCC) ?  Acute respiratory failure with hypoxia ([place]) ?  Urinary retention ?  Fever ?  Hypernatremia ?  Cerebral embolism with cerebral infarction ? ? ?Acute right MCA stroke (s/p tPA and mechanical thrombectomy) complicated by Cerebral edema requiring decompressive craniectomy on 4/24 ?Follow-up CT 4/28 shows large infarct R MCA, but essentially complete resolution of shift.  ?- con't to minimize sedation/ limit narcotics  ?- per Neurology ?- neuroprotective measures> HOB >30 degrees, avoid hypocapnia, maintain normal BG and electrolytes ?- bone flap in R abdomen> will replace when edema resolves  ?- cont amantadine  ?- Keppra per neurology ?- CTH 5/3 stable from prior exam ?- cont ASA/ lipitor  ?- cont cortrak, may need Gtube ?- cont OT/ PT  ?- can likely go to LTAC soon once fevers/ AKI resolved ? ?Poorly controlled HTN ?- soft SBP overnight but better today, no meds held overnight.  Monitor closely  ?- con't coreg, clonidine, amlodipine, lisinopril, doxazosin ? ?Acute hypoxic respiratory failure requiring MV ?VAP/ Staph pneumonia  ?- s/p trach 5/1 ?- Continue MV support, 8cc/kg IBW with goal Pplat <30 and DP<15  ?- VAP prevention protocol/ PPI ?- PAD protocol for sedation> tylenol, prn fentanyl (limit narcotics with ileus) ?- sat goal SpO2 >92%  ?- daily SBT> goal is eventually ATC ?- follow trach asp cx final result.  Continue abx for now pending susceptibilities  ? ?Ileus ?- cont NGT to LWIS  ?- BM overnight reassuring ?- pending repeat KUB, if not improving will need imaging/ CT ?- cont reglan for now ?- keep K> 4 ? ?AKI ?Fluid and electrolyte imbalance: therapeutic hypernatremia, secondary hyperchloremia ?hypokalemia ?- NS liter bolus then LR 100 ml/hr  ?- holding FWF while NPO ?- KCL runs x 4 today, goal K> 4 with ileus ?- strict I/Os, closely monitor renal status/ UOP ?- avoid nephrotoxins  ?- monitor for urinary retention ? ?DM with  hyperglycemia, improved control. ?- cont SSI resistant  ?- increase levemir today ? ?Urinary retention  ?- continue to monitor for urinary retention, prn bladder scan  ?- con't urecholine and doxazosin ? ?Fevers 2/2 staph pneumonia ?- foley and PICC removed 5/1 ?- sinuses on CT 5/2 ok  ?- follow trach asp> staph >  ?- follow blood cultures  ?- cont  vanc  ?- suspected VZV rash could also be contributing to fevers  ? ?Acute anemia and acute thrombocytopenia due to critical illness ?- transfuse for Hb<7 or hemodynamically significant bleeding ?- trend on CBC ? ?Rash- upper torso/ arm ?- suspicious for VZV rash ?- empiric airborne and contact precautions ?- assess DFA, IgM and IgG ?- will discuss with attending about antiviral therapy > empirically start valtrex 1gm TID for at least 5 days or longer if lesions not resolved ? ?Best Practice (right click and "Reselect all SmartList Selections" daily)  ? ?Diet/type: tubefeeds- on hold for ileus ?DVT prophylaxis: LMWH ?GI prophylaxis: PPI ?Lines: Central line and  No longer needed.  Order written to d/c  ?Foley:  N/A  ?Code Status:  full code ?Last date of multidisciplinary goals of care discussion [Wife updated at bedside 5/1] ? ?Wife updated at bedside 5/3 ? ?CCT 33 mins ? ?Kennieth Rad, ACNP ?[facility] ?09/24/2021, 9:46 AM ? ?See Amion for pager ?If no response to pager, please call PCCM consult pager ?After 7:00 pm call Elink   ? ?

## 2021-09-24 NOTE — Progress Notes (Signed)
Pt's wife says that she noticed the rash on the pt's chest and left arm starting Sunday 09/21/21. Infection prevention updated. Tyke Outman, Rande Brunt, RN   ?

## 2021-09-24 NOTE — Progress Notes (Signed)
Nutrition Follow-up ? ?DOCUMENTATION CODES:  ? ?Not applicable ? ?INTERVENTION:  ? ?Once able to resume Tube feeding via cortrak tube: ?Glucerna 1.5 at 60 ml/h (1440 ml per day) ?Prosource TF 45 ml daily  ? ?Provides 2200 kcal, 129 gm protein, 1094 ml free water daily ? ? ?NUTRITION DIAGNOSIS:  ? ?Inadequate oral intake related to inability to eat as evidenced by NPO status. ?Ongoing.  ? ?GOAL:  ? ?Patient will meet greater than or equal to 90% of their needs ?Not met, TF held ? ?MONITOR:  ? ?TF tolerance ? ?REASON FOR ASSESSMENT:  ? ?Consult ?Enteral/tube feeding initiation and management ? ?ASSESSMENT:  ? ?Pt with PMH of HTN and DM admitted with R M1 occlusion s/p TNK and thrombectomy. ? ?Pt discussed during ICU rounds and with RN.  ?Pt on vent support via trach. TF off since 5/1 due to ileus. Did have BM this am. Spoke with NP will consider resuming TF 5/4 if improvement. Pt with new fevers/rash from varicella zoster infection.  ? ?4/24 s/p R frontotemporal parietal hemicraniectomy, bone flap in abd ?4/25 TF initiated  ?4/26 s/p cortrak placement; tip gastric  ?5/1 s/p trach placement; developed ileus and TF held ? ? ? ?Medications reviewed and include: colace,SSI, novolog, levemir, reglan 10 mg every 6 hours, protonix, miralax ?LR @ 100 ml/hr  ?KCl  ? ? ?Labs reviewed: Na 150, K 3.3 ?A1C: 8.1 (4/21) ?CBG's: 182-241 (NPO)  ? ? ?50 F NG tube for suction - 900 ml out x 24 hours  ?1300 ml out this am ? ? ?Diet Order:   ?Diet Order   ? ?       ?  Diet NPO time specified  Diet effective now       ?  ? ?  ?  ? ?  ? ? ?EDUCATION NEEDS:  ? ?Not appropriate for education at this time ? ?Skin:  Skin Assessment: Reviewed RN Assessment (head incision, abd incision for bone flap) ? ?Last BM:  5/3 ? ?Height:  ? ?Ht Readings from Last 1 Encounters:  ?09/12/21 6' 1" (1.854 m)  ? ? ?Weight:  ? ?Wt Readings from Last 1 Encounters:  ?09/21/21 110 kg  ? ? ?BMI:  Body mass index is 31.99 kg/m?. ? ?Estimated Nutritional Needs:   ? ?Kcal:  2100-2500 ? ?Protein:  110-125 grams ? ?Fluid:  >2 L/day ? ?Lockie Pares., RD, LDN, CNSC ?See AMiON for contact information  ? ?

## 2021-09-24 NOTE — Progress Notes (Signed)
SLP Cancellation Note ? ?Patient Details ?Name: Jacob [name] ?MRN: [id number] ?DOB: 05/07/1976 ? ? ?Cancelled treatment:       Reason Eval/Treat Not Completed: Patient not medically ready. Will follow for readiness ? ? ?Tanikka Bresnan, Katherene Ponto ?09/24/2021, 8:26 AM ?

## 2021-09-24 NOTE — Progress Notes (Signed)
Insurance paperwork filled out for patient and returned to RN for his wife. ? ?Julian Hy, DO 09/24/21 9:42 AM ?[facility] ? ?

## 2021-09-24 NOTE — Progress Notes (Signed)
Subjective: ?The patient is more alert.  He is in no apparent distress.  His wife is at the bedside. ? ?Objective: ?Vital signs in last 24 hours: ?Temp:  [99.3 ?F (37.4 ?C)-101.5 ?F (38.6 ?C)] 101.5 ?F (38.6 ?C) (05/03 0800) ?Pulse Rate:  [77-91] 89 (05/03 0900) ?Resp:  [15-29] 15 (05/03 0900) ?BP: (98-147)/(64-92) 137/87 (05/03 0900) ?SpO2:  [94 %-100 %] 97 % (05/03 0900) ?FiO2 (%):  [30 %] 30 % (05/03 0757) ?Estimated body mass index is 31.99 kg/m? as calculated from the following: ?  Height as of this encounter: 6\' 1"  (1.854 m). ?  Weight as of this encounter: 110 kg. ? ? ?Intake/Output from previous day: ?05/02 0701 - 05/03 0700 ?In: 1489.5 [I.V.:148.3; NG/GT:200; IV Piggyback:1141.2] ?Out: 1900 [Urine:1000; Emesis/NG output:900] ?Intake/Output this shift: ?Total I/O ?In: 161.5 [I.V.:15.7; IV Piggyback:145.8] ?Out: -  ? ?Physical exam Glasgow Coma Scale 9, intubated, [id number].  He is moving his right upper and lower extremities well.  He follows commands.  His craniotomy wound is healing well. ? ?I reviewed the patient's head CT performed today.  There is no change in his right MCA stroke.  His midline shift has resolved. ? ?Lab Results: ?Recent Labs  ?  09/23/21 ?0302 09/24/21 ?[id number]  ?WBC 14.5* 18.5*  ?HGB 11.0* 10.4*  ?HCT 34.3* 31.9*  ?PLT 160 PLATELET CLUMPS NOTED ON SMEAR, UNABLE TO ESTIMATE  ? ?BMET ?Recent Labs  ?  09/23/21 ?0302 09/24/21 ?[id number]  ?NA 151* 150*  ?K 3.3* 3.3*  ?CL 116* 114*  ?CO2 26 24  ?GLUCOSE 158* 221*  ?BUN 39* 63*  ?CREATININE 1.33* 1.68*  ?CALCIUM 8.6* 8.7*  ? ? ?Studies/Results: ?DG Abd 1 View ? ?Result Date: 09/23/2021 ?CLINICAL DATA:  Ileus EXAM: ABDOMEN - 1 VIEW COMPARISON:  Multiple KUB, most recently 09/22/2021. CT AP, 10/23/2013 FINDINGS: Support lines: Small bore enteric feeding tube with tip within stomach. Multiple air-and-fluid dilated loops of small bowel. Mild air distention of colon. Overlying surgical staples.  No interval osseous abnormality. IMPRESSION: 1. Small bore  enteric feeding tube within stomach. Consider advancement if transpyloric placement is desired. 2. Persistent air-and-fluid distention of small bowel, and to a lesser degree of colon. Findings consistent with reported ileus. Electronically Signed   By: Michaelle Birks M.D.   On: 09/23/2021 07:57  ? ?DG Abd 1 View ? ?Result Date: 09/22/2021 ?CLINICAL DATA:  Ileus. EXAM: ABDOMEN - 1 VIEW COMPARISON:  September 17, 2021. FINDINGS: Feeding tube tip in stomach as on the prior study. Stomach moderately distended. Surgical staples over the RIGHT lower quadrant. Diffuse gaseous distension of both large and small bowel. Gas in the area of the rectum. On limited assessment no acute regional skeletal process. IMPRESSION: Findings most suggestive of global ileus. Would however suggest follow-up as the ascending colon shows dilation nearing 7 cm. Pattern bowel dilation is similar to recent imaging. Is Electronically Signed   By: Zetta Bills M.D.   On: 09/22/2021 10:04  ? ?CT HEAD WO CONTRAST (5MM) ? ?Result Date: 09/24/2021 ?CLINICAL DATA:  Follow-up stroke, subsequent encounter EXAM: CT HEAD WITHOUT CONTRAST TECHNIQUE: Contiguous axial images were obtained from the base of the skull through the vertex without intravenous contrast. RADIATION DOSE REDUCTION: This exam was performed according to the departmental dose-optimization program which includes automated exposure control, adjustment of the mA and/or kV according to patient size and/or use of iterative reconstruction technique. COMPARISON:  09/19/2021 FINDINGS: Brain: Diffuse cytotoxic edema is noted throughout the distribution of the right MCA and PCA  watershed region similar to that seen on the prior exam. No parenchymal hemorrhage is seen. No new focal infarct is seen. Vascular: No hyperdense vessel or unexpected calcification. Skull: Large calvarial defect there is noted on the right stable from the prior study. Sinuses/Orbits: Paranasal sinuses are within normal limits. Other:  Gastric catheter and feeding catheter are noted extending through the nose. IMPRESSION: Right MCA/PCA infarct stable in appearance from the previous exam. No acute hemorrhage is noted. Right-sided hemi craniectomy stable from the prior exam. No new focal abnormality is noted. Electronically Signed   By: Inez Catalina M.D.   On: 09/24/2021 02:47  ? ?DG Chest Port 1 View ? ?Result Date: 09/22/2021 ?CLINICAL DATA:  Status post tracheostomy. EXAM: PORTABLE CHEST 1 VIEW COMPARISON:  Chest x-ray from same day at 0919 hours. FINDINGS: New tracheostomy tube with tip at the thoracic inlet between the clavicular heads. Unchanged feeding tube entering the stomach with the tip below the field of view. Stable cardiomediastinal silhouette. Continued low lung volumes. Worsening aeration of the left lower lobe with increasing retrocardiac opacity and silhouetting of the left hemidiaphragm. Unchanged mild atelectasis in the medial right lung base. No pleural effusion or pneumothorax. No acute osseous abnormality. IMPRESSION: 1. New tracheostomy tube with tip at the thoracic inlet. 2. Worsening left lower lobe atelectasis versus pneumonia. Electronically Signed   By: Titus Dubin M.D.   On: 09/22/2021 15:48  ? ?DG CHEST PORT 1 VIEW ? ?Result Date: 09/22/2021 ?CLINICAL DATA:  Hypoxia. EXAM: PORTABLE CHEST 1 VIEW COMPARISON:  Radiographs 09/20/2021 and 09/17/2021. FINDINGS: 0919 hours. Endotracheal tube terminates at the level of the clavicular heads. Feeding tube projects below the diaphragm, tip not visualized. There are persistent low lung volumes with unchanged patchy bibasilar and perihilar opacities bilaterally. No pneumothorax or significant pleural effusion identified. The heart size and mediastinal contours are stable. The bones appear unremarkable. Telemetry leads overlie the chest. IMPRESSION: Stable radiographic appearance of the chest with patchy bibasilar and perihilar pulmonary opacities. Unchanged position of the  endotracheal and feeding tubes. Electronically Signed   By: Richardean Sale M.D.   On: 09/22/2021 09:49  ? ?DG Abd Portable 1V ? ?Result Date: 09/23/2021 ?CLINICAL DATA:  NG tube placement EXAM: PORTABLE ABDOMEN - 1 VIEW COMPARISON:  Same day radiograph FINDINGS: There is a weighted tip feeding tube with tip overlying the body of the stomach. There is a nasogastric tube with tip and side port overlying the stomach. There is diffuse gaseous distension of small and large bowel. IMPRESSION: Weighted tip feeding tube tip overlies the body of the stomach. Nasogastric tube tip and side port overlie the stomach. Persistent diffuse gaseous distension of small and large bowel, compatible with adynamic ileus. Electronically Signed   By: Maurine Simmering M.D.   On: 09/23/2021 12:54  ? ?DG Abd Portable 1V ? ?Result Date: 09/22/2021 ?CLINICAL DATA:  NG tube placement EXAM: PORTABLE ABDOMEN - 1 VIEW COMPARISON:  Sep 22, 2021 9:46 a.m. FINDINGS: The previously noted feeding tube is unchanged in the mid stomach. There is interval placement of a nasogastric tube with distal tip in the mid stomach. Bowel gas pattern is unchanged compared to prior exam. IMPRESSION: Nasogastric tube with distal tip in the mid stomach. Electronically Signed   By: Abelardo Diesel M.D.   On: 09/22/2021 11:03   ? ?Assessment/Plan: ?Status post decompressive craniectomy, right MCA stroke: The patient is slowly improving neurologically.  Hopefully he can be weaned from the ventilator and get into rehab.  I  answered all the patient's wife's questions.  We can replace his craniotomy flap once the brain swelling decreases. ? LOS: 12 days  ? ? ? ?[name] ?09/24/2021, 9:13 AM ? ? ? ? ?Patient ID: Jacob [name], male   DOB: 02-09-1976, 46 y.o.   MRN: [name] ? ?

## 2021-09-25 DIAGNOSIS — Z9911 Dependence on respirator [ventilator] status: Secondary | ICD-10-CM | POA: Diagnosis not present

## 2021-09-25 DIAGNOSIS — J9601 Acute respiratory failure with hypoxia: Secondary | ICD-10-CM | POA: Diagnosis not present

## 2021-09-25 DIAGNOSIS — I63511 Cerebral infarction due to unspecified occlusion or stenosis of right middle cerebral artery: Secondary | ICD-10-CM | POA: Diagnosis not present

## 2021-09-25 DIAGNOSIS — I639 Cerebral infarction, unspecified: Secondary | ICD-10-CM | POA: Diagnosis not present

## 2021-09-25 DIAGNOSIS — R509 Fever, unspecified: Secondary | ICD-10-CM | POA: Diagnosis not present

## 2021-09-25 DIAGNOSIS — G936 Cerebral edema: Secondary | ICD-10-CM | POA: Diagnosis not present

## 2021-09-25 LAB — RENAL FUNCTION PANEL
Albumin: 2 g/dL — ABNORMAL LOW (ref 3.5–5.0)
Anion gap: 11 (ref 5–15)
BUN: 57 mg/dL — ABNORMAL HIGH (ref 6–20)
CO2: 22 mmol/L (ref 22–32)
Calcium: 8.8 mg/dL — ABNORMAL LOW (ref 8.9–10.3)
Chloride: 119 mmol/L — ABNORMAL HIGH (ref 98–111)
Creatinine, Ser: 1.47 mg/dL — ABNORMAL HIGH (ref 0.61–1.24)
GFR, Estimated: 58 mL/min — ABNORMAL LOW (ref >60)
Glucose, Bld: 150 mg/dL — ABNORMAL HIGH (ref 70–99)
Phosphorus: 4.1 mg/dL (ref 2.5–4.6)
Potassium: 3.7 mmol/L (ref 3.5–5.1)
Sodium: 152 mmol/L — ABNORMAL HIGH (ref 135–145)

## 2021-09-25 LAB — GLUCOSE, CAPILLARY
Glucose-Capillary: 119 mg/dL — ABNORMAL HIGH (ref 70–99)
Glucose-Capillary: 122 mg/dL — ABNORMAL HIGH (ref 70–99)
Glucose-Capillary: 138 mg/dL — ABNORMAL HIGH (ref 70–99)
Glucose-Capillary: 146 mg/dL — ABNORMAL HIGH (ref 70–99)
Glucose-Capillary: 155 mg/dL — ABNORMAL HIGH (ref 70–99)
Glucose-Capillary: 90 mg/dL (ref 70–99)
Glucose-Capillary: 98 mg/dL (ref 70–99)

## 2021-09-25 LAB — CBC
HCT: 32.9 % — ABNORMAL LOW (ref 39.0–52.0)
Hemoglobin: 10.5 g/dL — ABNORMAL LOW (ref 13.0–17.0)
MCH: 29.1 pg (ref 26.0–34.0)
MCHC: 31.9 g/dL (ref 30.0–36.0)
MCV: 91.1 fL (ref 80.0–100.0)
Platelets: 173 10*3/uL (ref 150–400)
RBC: 3.61 MIL/uL — ABNORMAL LOW (ref 4.22–5.81)
RDW: 14.4 % (ref 11.5–15.5)
WBC: 17.2 10*3/uL — ABNORMAL HIGH (ref 4.0–10.5)
nRBC: 0 % (ref 0.0–0.2)

## 2021-09-25 LAB — VARICELLA ZOSTER ANTIBODY, IGM: Varicella-Zoster Ab, IgM: <0.91 index (ref 0.00–0.90)

## 2021-09-25 LAB — MAGNESIUM: Magnesium: 2.8 mg/dL — ABNORMAL HIGH (ref 1.7–2.4)

## 2021-09-25 MED ORDER — BISACODYL 10 MG RE SUPP
10.0000 mg | Freq: Every day | RECTAL | Status: DC
  Administered 2021-09-25 – 2021-10-06 (×9): 10 mg via RECTAL
  Filled 2021-09-25 (×11): qty 1

## 2021-09-25 MED ORDER — LEVETIRACETAM IN NACL 500 MG/100ML IV SOLN
500.0000 mg | Freq: Two times a day (BID) | INTRAVENOUS | Status: DC
  Administered 2021-09-25 – 2021-10-02 (×16): 500 mg via INTRAVENOUS
  Filled 2021-09-25 (×16): qty 100

## 2021-09-25 MED ORDER — CLONIDINE HCL 0.3 MG/24HR TD PTWK
0.3000 mg | MEDICATED_PATCH | TRANSDERMAL | Status: DC
  Administered 2021-09-25 – 2021-10-02 (×2): 0.3 mg via TRANSDERMAL
  Filled 2021-09-25 (×2): qty 1

## 2021-09-25 MED ORDER — PANTOPRAZOLE SODIUM 40 MG IV SOLR
40.0000 mg | INTRAVENOUS | Status: DC
  Administered 2021-09-25 – 2021-10-02 (×8): 40 mg via INTRAVENOUS
  Filled 2021-09-25 (×8): qty 10

## 2021-09-25 MED ORDER — HYDRALAZINE HCL 20 MG/ML IJ SOLN: 25.0000 mg | Freq: Four times a day (QID) | INTRAMUSCULAR | Status: DC

## 2021-09-25 MED ORDER — HYDRALAZINE HCL 20 MG/ML IJ SOLN
25.0000 mg | Freq: Four times a day (QID) | INTRAMUSCULAR | Status: DC | PRN
  Administered 2021-09-26 – 2021-10-04 (×3): 25 mg via INTRAVENOUS
  Filled 2021-09-25 (×3): qty 2

## 2021-09-25 MED ORDER — POTASSIUM CHLORIDE 20 MEQ PO PACK
40.0000 meq | PACK | Freq: Once | ORAL | Status: AC
  Administered 2021-09-25: 40 meq
  Filled 2021-09-25: qty 2

## 2021-09-25 MED ORDER — VANCOMYCIN HCL 1500 MG/300ML IV SOLN
1500.0000 mg | INTRAVENOUS | Status: DC
  Administered 2021-09-25 – 2021-09-28 (×4): 1500 mg via INTRAVENOUS
  Filled 2021-09-25 (×5): qty 300

## 2021-09-25 MED ORDER — METOCLOPRAMIDE HCL 5 MG/ML IJ SOLN
5.0000 mg | Freq: Four times a day (QID) | INTRAMUSCULAR | Status: AC
  Administered 2021-09-25 – 2021-09-27 (×8): 5 mg via INTRAVENOUS
  Filled 2021-09-25 (×8): qty 2

## 2021-09-25 MED ORDER — DEXTROSE 5 % IV SOLN
800.0000 mg | Freq: Three times a day (TID) | INTRAVENOUS | Status: DC
  Administered 2021-09-25 – 2021-09-29 (×12): 800 mg via INTRAVENOUS
  Filled 2021-09-25 (×14): qty 16

## 2021-09-25 MED ORDER — DEXTROSE IN LACTATED RINGERS 5 % IV SOLN: INTRAVENOUS | Status: DC

## 2021-09-25 NOTE — Progress Notes (Signed)
? ?NAME:  Jacob [name], MRN:  [name], DOB:  [birth year], LOS: 40 ?ADMISSION DATE:  09/12/2021, CONSULTATION DATE:  09/15/2021 ?REFERRING MD:  Leonie Man - Stroke, CHIEF COMPLAINT:  Cerebral edema.   ? ?History of Present Illness:  ?46 year old man with worsening cerebral edema. ? ?He presented 4/21 with left-sided weakness which was initially waxing and waning. CTA showed right M1 occlusion.  Further decline post TNK brought for thrombectomy at [facility] 3 revascularization of right M2. ?Transfer to floor but experienced neurological decline 4/24.  CT showed malignant cerebral edema with 12 mm right to left midline shift and Trapping of the temporal horn. ? ?He was brought back to the ICU and started on 3% hypertonic saline and prepped for the operating room.  Platelets were transfused to offset effect of clopidogrel and aspirin. ? ?Taken to OR and underwent right frontotemporal parietal hemicraniectomy with placement of bone flap in abdominal subcutaneous tissue. Returned to ICU on vent. ? ?Pertinent  Medical History  ? ?Past Medical History:  ?Diagnosis Date  ? Allergy   ? Diabetes mellitus without complication ([place])   ? Hypertension   ? ?Significant Hospital Events: ?Including procedures, antibiotic start and stop dates in addition to other pertinent events   ?4/24 decompressive craniectomy. ?4/25 decreased midline shift on CT ?4/26 still on HT saline.  Switched from prop to precedex. BP meds titrated up in effort to decrease cleviprex. Cortrak placed.  ?4/27 Cleviprex maxed out, started on labetolol gtt. Started scheduled clonidine, also started coreg. Foley replaced and started on doxazosin due to urinary retention. HT infusion cut in half w/ plan to work towards stopping. Stopped fent gtt. Changed to PRN w/ plan to start precedex if needed.  Checking [place] of UE to eval fever. Also sending PCT ?5/1 trached, c/o pain upper abd, tmax 101.3, diarrhea overnight ?5/2 febrile 102.7, WBC up, c/o ongoing abd pain/distention, KUB  c/w ileus, cultured and started on vanc/ cefepime, NGT placed for decompression. ? ?Interim History / Subjective:  ?Today complains of mild trach pain. Tmax 101.9. ? ?Objective   ?Blood pressure 118/89, pulse 79, temperature 99.4 ?F (37.4 ?C), temperature source Axillary, resp. rate 13, height 6\' 1"  (1.854 m), weight 104.2 kg, SpO2 98 %. ?   ?Vent Mode: PRVC ?FiO2 (%):  [30 %] 30 % ?Set Rate:  [18 bmp] 18 bmp ?Vt Set:  [id number] mL] 630 mL ?PEEP:  [5 cmH20] 5 cmH20 ?Pressure Support:  [14 cmH20] 14 cmH20 ?Plateau Pressure:  [17 cmH20-23 cmH20] 18 cmH20  ? ?Intake/Output Summary (Last 24 hours) at 09/25/2021 0847 ?Last data filed at 09/25/2021 0800 ?Gross per 24 hour  ?Intake 4637.33 ml  ?Output 4450 ml  ?Net 187.33 ml  ? ? ?Filed Weights  ? 09/20/21 0500 09/21/21 0500 09/25/21 0326  ?Weight: 109.9 kg 110 kg 104.2 kg  ? ?Examination: ?General:  critically ill appearing man lying in bed in NAD ?HEENT: hemicrani scar without erythema, dry lips ?Neuro:  opens eyes with stimulation, smiled at his sister today, Left facial droop, left hemiparesis, moving RUE  & RLE following commands, nodding to answer some questions, strong cough ?CV: S1S2, RRR ?PULM:  thick tan secretions, mild rhonchi ?GI: softer, NT ?Extremities: warm/dry, mild generalized UE/ pedal edema  ?Skin: slightly improved pustular rash across anterior upper torso and left arm some vesicular > few on right arm ? ?UOP 1L/ 24 hrs + 1.65 unmeasured  ?2 BM yesterday ? ?Na+  153> 151> 150> 152 ?K+ 3.7 ?Cl 116> 114>119 ?BUN  39> 63> 57 ?Cr 1.2> 1.33> 1.68 > 1.47 ?WBC 12.3 > 14.5> 18.5 > 17.3 ?H/H 9.9/30.3 > 11/ 34.3 > 10.4/ 31.9  >10.5/32.9 ?Platelets 136 > 160> 173 ? ?BG 130-160s ? ?Assessment & Plan:  ?Active Problems: ?  Diabetes mellitus without complication ([place]) ?  Essential hypertension ?  Hyperlipidemia ?  Cerebral edema (HCC) ?  Acute respiratory failure with hypoxia ([place]) ?  Urinary retention ?  Fever ?  Hypernatremia ?  Cerebral embolism with cerebral  infarction ? ? ?Acute right MCA stroke (s/p tPA and mechanical thrombectomy) complicated by Cerebral edema requiring decompressive craniectomy on 4/24 ?Follow-up CT 4/28 shows large infarct R MCA, but essentially complete resolution of shift.  ?-Continue to minimize sedation and limit narcotics ?- Aspirin, statin ?- Left have the goal bone replaced in 6 to 8 weeks ?- Neuroprotective measures, evaluating 30 degrees, avoid hypercapnia, and maintain normal blood sugar ?- Holding amantadine to limit p.o. meds.  Can resume in a few days ?- Continue Keppra per neurology ?- PT, OT, SLP ?- Continue core track, anticipate he may require NG tube eventually ?- can likely go to LTAC soon once fevers/ AKI resolved ? ?HTN ?-Continue Coreg, clonidine, amlodipine, lisinopril.  Switching clonidine to patch. ? ?Acute hypoxic respiratory failure requiring MV ?MRSA pneumonia  ?-Trach on 5/1 ?- Continue low tidal volume ventilation ?- Vent weaning efforts ?- Routine trach care ?- Can remove trach sutures on 5/8 ?- 7 days of antibiotics - vanc ? ?Ileus ?-Limit p.o. meds ?- NG tube to low intermittent suction ?- Bowel regimen per rectum ?- IV Reglan ?-Optimize electrolytes ?-Avoid opiates ? ?AKI ?Fluid and electrolyte imbalance: therapeutic hypernatremia, secondary hyperchloremia ?hypokalemia ?-Continue maintenance fluids until able to take p.o. ?-Holding free water flushes while n.p.o. ?- Optimize electrolytes ?- Strict I's/O ?-Continue to monitor ? ?DM with hyperglycemia, improved control. ?-SSI ?-Increase detemir to 32 units twice daily ?- Goal BG 140-180 ? ?Urinary retention  ?- continue to monitor for urinary retention, prn bladder scan  ?-Off retention meds now ? ?Acute anemia and acute thrombocytopenia due to critical illness ?-Continue to monitor ?- Transfuse renal LABA less than 7 or hemodynamically significant bleeding ? ?Rash- upper torso/ arm- suspicious for VZV rash ?-Antibiotic contact cautions ?- Confirmatory labs  pending ?- Acyclovir ? ?Sister updated at bedside during rounds. ?Best Practice (right click and "Reselect all SmartList Selections" daily)  ? ?Diet/type: tubefeeds- on hold for ileus ?DVT prophylaxis: LMWH ?GI prophylaxis: PPI ?Lines: Central line and No longer needed.  Order written to d/c  ?Foley:  N/A  ?Code Status:  full code ?Last date of multidisciplinary goals of care discussion [Wife updated at bedside 5/1] ? ? ?This patient is critically ill with multiple organ system failure which requires frequent high complexity decision making, assessment, support, evaluation, and titration of therapies. This was completed through the application of advanced monitoring technologies and extensive interpretation of multiple databases. During this encounter critical care time was devoted to patient care services described in this note for 40 minutes. ? ?Julian Hy, DO 09/25/21 7:39 PM ?[facility] ? ?

## 2021-09-25 NOTE — Progress Notes (Signed)
Pt placed on PS/CPAP 12/5 on 30% and is tolerating well. RT will monitor. ?

## 2021-09-25 NOTE — Progress Notes (Signed)
[facility] ADULT ICU REPLACEMENT PROTOCOL ? ? ?The patient does apply for the [facility] Adult ICU Electrolyte Replacment Protocol based on the criteria listed below:  ? ?1.Exclusion criteria: TCTS patients, ECMO patients, and Dialysis patients ?2. Is GFR >/= 30 ml/min? Yes.    ?Patient's GFR today is 58 ?3. Is SCr </= 2? Yes.   ?Patient's SCr is 1.47 mg/dL ?4. Did SCr increase >/= 0.5 in 24 hours? No. ?5.Pt's weight >40kg  Yes.   ?6. Abnormal electrolyte(s): K+ 3.7  ?7. Electrolytes replaced per protocol ?8.  Call MD STAT for K+ </= 2.5, Phos </= 1, or Mag </= 1 ?Physician:  n/a ? ?Jacob [name] 09/25/2021 5:32 AM ? ?

## 2021-09-25 NOTE — Progress Notes (Signed)
? ?NAME:  Jacob [name], MRN:  [id number], DOB:  [birth year], LOS: 59 ?ADMISSION DATE:  09/12/2021, CONSULTATION DATE:  09/15/2021 ?REFERRING MD:  Leonie Man - Stroke, CHIEF COMPLAINT:  Cerebral edema.   ? ?History of Present Illness:  ?46 year old man with worsening cerebral edema. ? ?He presented 4/21 with left-sided weakness which was initially waxing and waning. CTA showed right M1 occlusion.  Further decline post TNK brought for thrombectomy at [facility] 3 revascularization of right M2. ?Transfer to floor but experienced neurological decline 4/24.  CT showed malignant cerebral edema with 12 mm right to left midline shift and Trapping of the temporal horn. ? ?He was brought back to the ICU and started on 3% hypertonic saline and prepped for the operating room.  Platelets were transfused to offset effect of clopidogrel and aspirin. ? ?Taken to OR and underwent right frontotemporal parietal hemicraniectomy with placement of bone flap in abdominal subcutaneous tissue. Returned to ICU on vent. ? ?Pertinent  Medical History  ? ?Past Medical History:  ?Diagnosis Date  ? Allergy   ? Diabetes mellitus without complication ([place])   ? Hypertension   ? ?Significant Hospital Events: ?Including procedures, antibiotic start and stop dates in addition to other pertinent events   ?4/24 decompressive craniectomy. ?4/25 decreased midline shift on CT ?4/26 still on HT saline.  Switched from prop to precedex. BP meds titrated up in effort to decrease cleviprex. Cortrak placed.  ?4/27 Cleviprex maxed out, started on labetolol gtt. Started scheduled clonidine, also started coreg. Foley replaced and started on doxazosin due to urinary retention. HT infusion cut in half w/ plan to work towards stopping. Stopped fent gtt. Changed to PRN w/ plan to start precedex if needed.  Checking [place] of UE to eval fever. Also sending PCT ?5/1 trached, c/o pain upper abd, tmax 101.3, diarrhea overnight ?5/2 febrile 102.7, WBC up, c/o ongoing abd pain/distention, KUB  c/w ileus, cultured and started on vanc/ cefepime, NGT placed for decompression. ?5/3 suspected VZV infection. Added acyclovir  ?5/4 failed SBT with apnea  ? ?Interim History / Subjective:  ?NAEO ? ?This morning failed PSV with some apnea  ? ?Reportedly smiled at sister at bedside  ? ?Tmax 102.1 ? ?Objective   ?Blood pressure 111/74, pulse 84, temperature (!) 102.1 ?F (38.9 ?C), temperature source Axillary, resp. rate 19, height 6\' 1"  (1.854 m), weight 104.2 kg, SpO2 98 %. ?   ?Vent Mode: PRVC ?FiO2 (%):  [30 %] 30 % ?Set Rate:  [18 bmp] 18 bmp ?Vt Set:  [id number] mL] 630 mL ?PEEP:  [5 cmH20] 5 cmH20 ?Pressure Support:  [12 cmH20] 12 cmH20 ?Plateau Pressure:  [17 cmH20-23 cmH20] 21 cmH20  ? ?Intake/Output Summary (Last 24 hours) at 09/25/2021 1404 ?Last data filed at 09/25/2021 1300 ?Gross per 24 hour  ?Intake 3750.7 ml  ?Output 4250 ml  ?Net -499.3 ml  ? ?Filed Weights  ? 09/20/21 0500 09/21/21 0500 09/25/21 0326  ?Weight: 109.9 kg 110 kg 104.2 kg  ? ?Examination: ?General:  critically ill appearing middle aged M trach/vent  ?HEENT: Trach secure. Anicteric sclera. Tan secretions  ?Neuro:  L hemiplegia.  ?CV: rrr s1s2 no rgm  ?PULM: Mechanically ventilated. Symmetrical chest expansion.  ?GI: soft distended + bowel sounds  ?Extremities:: no acute deformity. Non-pitting extremity edema  ?Skin: c/d/w ? ? ?Assessment & Plan:  ? ?Acute R MCA CVA, s/p tpa and mechanical thrombectomy, c/b cerebral edema ("brain compression"), s/p decompressive crani 4/24 ?P ?-minimize sedation ?-stroke per neuro  ?- per  Neurology ?-flap in R abdomen  ?-amantadine ?-keppra  ?- cont ASA/ lipitor  ?- Delirium precautions ? ?Acute hypoxic respiratory failure ?S/p tracheostomy ?HCAP, MRSA PNA ?- s/p trach 5/1 ?P ?-wean vent support as able  ?-goal ultimately is ATC -- 5/4 he failed PSV with apnea  ? ?AKI ?Hypernatremia ?P ?-will ad d5LR at 25/hr and recheck Na 5/4 evening ?-FWF on hold while NPO  ?-trend renal indices, UOP ?-cont mIVF -- on LR at  100/hr. At risk for worsening renal fxn with antiviral  ? ?HTN ?P ?-lisinopril, clonidine, coreg, norvasc  ? ?Ileus, improving  ?P ?- cont NGT to LWIS  ?-BM on 5/4 ?-cont bowel reg ? ?Suspected VZV infection ?P ?-acyclovir ?-PCR pending  ? ?Fever, leukocytosis -- Suspected sepsis due to MRSA PNA and VZV infection ?P ?-abx and antiviral as above ?-trend WBC, fever curve  ? ?DM with hyperglycemia  ?- basal, SSI  ? ? ? ?Best Practice (right click and "Reselect all SmartList Selections" daily)  ? ?Diet/type: tubefeeds- on hold for ileus ?DVT prophylaxis: LMWH ?GI prophylaxis: PPI ?Lines: N/A ?Foley:  N/A  ?Code Status:  full code ?Last date of multidisciplinary goals of care discussion [Wife updated at bedside 5/3] ? ? ? ?CRITICAL CARE ?Performed by: Cristal Generous ? ? ?Total critical care time: 38 minutes ? ?Critical care time was exclusive of separately billable procedures and treating other patients. ? ?Critical care was necessary to treat or prevent imminent or life-threatening deterioration. ? ?Critical care was time spent personally by me on the following activities: development of treatment plan with patient and/or surrogate as well as nursing, discussions with consultants, evaluation of patient's response to treatment, examination of patient, obtaining history from patient or surrogate, ordering and performing treatments and interventions, ordering and review of laboratory studies, ordering and review of radiographic studies, pulse oximetry and re-evaluation of patient's condition. ? ?Eliseo Gum MSN, AGACNP-BC ?[facility] ?Amion for pager  ?09/25/2021, 2:05 PM ? ? ?

## 2021-09-25 NOTE — Progress Notes (Signed)
Pharmacy Antibiotic Note ? ?Jacob [name] is a 46 y.o. male admitted on 09/12/2021 with  Varicella zoster .  Pharmacy has been consulted for IV acyclovir dosing. ? ?Scr 1.47 (improved from 1.68) ? ?Plan: ?Will start acyclovir 800mg  q8hr along with LR @100  ml/hr.  Will monitor renal function closely for dose adjustments ?Transition to po valacyclovir when able ? ?Height: 6\' 1"  (185.4 cm) ?Weight: 104.2 kg (229 lb 11.5 oz) ?IBW/kg (Calculated) : 79.9 ? ?Temp (24hrs), Avg:100.5 ?F (38.1 ?C), Min:99.3 ?F (37.4 ?C), Max:101.9 ?F (38.8 ?C) ? ?Recent Labs  ?Lab 09/21/21 ?0206 09/22/21 ?0500 09/23/21 ?0302 09/24/21 ?0459 09/25/21 ?0411  ?WBC 11.7* 12.3* 14.5* 18.5* 17.2*  ?CREATININE 1.24 1.20 1.33* 1.68* 1.47*  ?  ?Estimated Creatinine Clearance: 77.9 mL/min (A) (by C-G formula based on SCr of 1.47 mg/dL (H)).   ? ?Allergies  ?Allergen Reactions  ? Hydrochlorothiazide Other (See Comments)  ?  Bilateral muscle cramping  ? ? ?Thank you for allowing pharmacy to be a part of this patient?s care. ? ?Donnald Garre, PharmD ?Clinical Pharmacist ? ?Please check AMION for all [place] numbers ?After 10:00 PM, call [place] [phone] ? ? ?

## 2021-09-25 NOTE — Progress Notes (Addendum)
STROKE TEAM PROGRESS NOTE  ? ?INTERVAL HISTORY ?Patient is seen in his room with no family at the bedside.  He has been hemodynamically stable and his neurological exam is stable.  Per RN, his secretions are too copious to try trach collar trials today. ? ?Vitals:  ? 09/25/21 1137 09/25/21 1200 09/25/21 1230 09/25/21 1300  ?BP:  124/78  111/74  ?Pulse:  87 83 84  ?Resp:  (!) 9 (!) 24 19  ?Temp: (!) 102.1 ?F (38.9 ?C)     ?TempSrc: Axillary     ?SpO2:  98% 99% 98%  ?Weight:      ?Height:      ? ?CBC:  ?Recent Labs  ?Lab 09/24/21 ?0459 09/25/21 ?0411  ?WBC 18.5* 17.2*  ?HGB 10.4* 10.5*  ?HCT 31.9* 32.9*  ?MCV 87.9 91.1  ?PLT PLATELET CLUMPS NOTED ON SMEAR, UNABLE TO ESTIMATE 173  ? ? ?Basic Metabolic Panel:  ?Recent Labs  ?Lab 09/20/21 ?1043 09/21/21 ?0206 09/24/21 ?[id number] 09/25/21 ?0411  ?NA  --    < > 150* 152*  ?K  --    < > 3.3* 3.7  ?CL  --    < > 114* 119*  ?CO2  --    < > 24 22  ?GLUCOSE  --    < > 221* 150*  ?BUN  --    < > 63* 57*  ?CREATININE  --    < > 1.68* 1.47*  ?CALCIUM  --    < > 8.7* 8.8*  ?MG 2.6*  --   --  2.8*  ?PHOS 3.1  --   --  4.1  ? < > = values in this interval not displayed.  ? ? ?Lipid Panel:  ?Recent Labs  ?Lab 09/18/21 ?2143  ?TRIG 212*  ? ? ?HgbA1c:  ?No results for input(s): HGBA1C in the last 168 hours. ? ?Urine Drug Screen:  ?No results for input(s): LABOPIA, COCAINSCRNUR, LABBENZ, AMPHETMU, THCU, LABBARB in the last 168 hours. ?  ?Alcohol Level No results for input(s): ETH in the last 168 hours. ? ?IMAGING past 24 hours ?No results found. ? ?PHYSICAL EXAM ? ?Physical Exam  ?Constitutional: Appears well-developed and well-nourished middle-age is sedated and intubated African-American male, intubated hemi craniectomy surgical incision with clips seen on the right side ?Cardiovascular: Normal rate and regular rhythm.  ?Respiratory: Respirations synchronous with ventilator ? ?Neuro (intubated without sedation): Right gaze deviation, blink to threat. Corneal intact bilateral.   Following  commands on the right.  PERRL,  oculocephalic reflex, cough and gag reflexes present.  ? ?ASSESSMENT/PLAN ?Jacob [name] is a 46 y.o. male with history of HTN, DM2 presenting with left side facial droop, left arm weakness. Received TNKase. Neuro status declined during MRI (NIHSS 13),taken for thrombectomy with Dr. Tommie Samse Macedo [name]. TICI3 recanalization of the right M2/MCA achieved.  MRI/MRA shows reoccluded right M2 branch with a right MCA territory infarct.  Continue with PT/OT/SLP evals.  Home blood pressure medications resumed. BP goal less than 160. Topamax and Tylenol #3 for headache management.  Head CT shows new 12mm leftward midline shift. Trasnferred back to ICU, decompressive hemicraniectomy performed by neurosurgery, bone flap placed in abdomen.  Follow-up CT shows 5 mm left midline shift.   HTS d/c'd due to Na of 160.  Tracheostomy placed 5/1.  Now febrile, empiric antibiotic started by CCM.  Tracheal aspirate cultured and blood culture sent. ? ?Stroke:  Right MCA due to right MCA occlusion  s/p TNK and IR with TICI3, etiology  not certain, but most likely cryptogenic versus intracranial atherosclerosis. ?Code Stroke CT head No acute abnormality ?CTA head & neck R short segment M1 occlusion, left distal M1 high grade stenosis ?Post IR CT No hemorrhagic complication on flat panel CT. ?4/21- Early MRI Tiny acute infarcts in the upper division right MCA cortex. ?4/22- MRI right MCA territory infarct involving the cortex, sparing the basal ganglia.  2 mm midline shift.  No hemorrhage ?4/22- MRA reoccluded right M2 branch, intracranial atherosclerosis including advanced left M1 stenosis ?4/24-  Head CT-increased edema resulting in near complete effacement of the right lateral ventricle and 12 mm leftward midline shift ?4/25- Head CT Post OP- 5 mm left midline shift, new area of hypoattenuation of the left middle cerebellar peduncle and cerebellum ?4/28 head CT large right MCA infarct with no HT,  diminished mass effect with no midline shift, hemicraniectomy noted ?5/3 CT head stable, no midline shift, large right MCA infarct ?2D Echo EF > 75% ?LDL 96 ?HgbA1c 8.1 ?UDS negative ?VTE prophylaxis - SCDs ?No antithrombotic prior to admission, now on ASA 81.  ?Therapy recommendations: PT/OT at [facility] ?disposition:  Pending ? ?Cerebral Edema s/p [facility] ?Head CT 4/24 shows increased edema with new 59mm midline shift ?Decompressive hemicraniectomy performed 4/24 by neurosurgery ?Remove staples on postop day 10 ?Na 155->160->159-> 156->153-> 150-> 152 ?Off HTS->FW->LR @ 100 ?No midline shift seen on head CT 4/28 ?On keppra 500mg  bid ?Allow Na gradually trending down ? ?Intracranial stenosis ?CTA head & neck R short segment M1 occlusion, left distal M1 high grade stenosis ?No significant extracranial stenosis ?Most likely related to long standing uncontrolled risk factors. ? ?Hypertension ?Home meds:  Amlodipine 10mg , lisinopril 10mg -resumed ?Stable ?BP goal <160  ?On coreg 25, amlodipine 10, clonidine 0.2 Q8, lisinopril 40  ?PRN labetalol and hydralazine ?Long-term BP goal normotensive ? ?Hyperlipidemia ?Home meds:  Atorvastatin 20mg  ?LDL 96, goal < 70 ?Put on lipitor 40 ?Continue statin at discharge ? ?Diabetes type II Uncontrolled ?Home meds:  Glipizide, metformin ?HgbA1c 8.1, goal < 7.0 ?CBGs ?SSI with resist scale, Levemir 11->40 units twice daily ?DM coordinator consulted ? ?Other Stroke Risk Factors ?Obesity, Body mass index is 30.31 kg/m?., BMI >/= 30 associated with increased stroke risk, recommend weight loss, diet and exercise as appropriate  ? ?Dysphagia  ?N.p.o. postop, still intubated ?Cortrak in place ?On TF @ 60 and FW 200 Q4 ? ?Respiratory failure ?Patient left intubated after surgery ?Ventilator management per CCM ?Tracheostomy placed 5/1 ?On weaning ? ?Ileus ?Bowel regimen in place with fiber supplement ?Gastric tube for decompression per CCM ?KUB 5/3 unchanged ?On LR @ 100 and D5 @ 25 ? ?Fever and  leukocytosis ??Chicken pox ?Tmax 101.5->102.1 ?WBC 12.3-14.5-18.5-17.2 ?Body rash concerning for ? chicken pox on acyclovir ?VZV IgM ?VZV PCR pending ?Tracheal aspirate + MRSA -> On vancomycin ? ?Hospital day # 13 ? ?Patient seen and examined by NP/APP with MD. MD to update note as needed.  ? ?[place] , MSN, AGACNP-BC ?[facility] ?See Amion for schedule and pager information ?09/25/2021 1:02 PM ? ?ATTENDING NOTE: ?I reviewed above note and agree with the assessment and plan. Pt was seen and examined.  ? ?RN at bedside.  Patient more awake alert than before, open eyes to voice, still follow commands on the right side, however still have left hemiplegia.  Continue to have high-grade fever and leukocytosis.  Body rash concerning for chickenpox, put on a cycle where.  Tracheal aspirate positive for MRSA, on vancomycin.  On trach.  Still has EDS, on NG wall suctioning, not able to perform PEG tube at this time.  Repeat CT yesterday showed no midline shift.  Appreciate CCM assistance.  PT/OT recommend LTAC ? ?For detailed assessment and plan, please refer to above as I have made changes wherever appropriate.  ? ?Rosalin Hawking, MD PhD ?Stroke Neurology ?09/25/2021 ?6:39 PM ? ?This patient is critically ill due to large right MCA infarct status post THC, cerebral edema, respiratory failure, fever, leukocytosis, chickenpox, MRSA infection, status post trach and at significant risk of neurological worsening, death form recurrent stroke, hemorrhagic transformation, brain herniation, sepsis, heart failure, renal failure. This patient's care requires constant monitoring of vital signs, hemodynamics, respiratory and cardiac monitoring, review of multiple databases, neurological assessment, discussion with family, other specialists and medical decision making of high complexity. I spent 35 minutes of neurocritical care time in the care of this patient. ? ? ? ?To contact Stroke Continuity provider, please refer  to [email or web address] ?After hours, contact General Neurology ? ?

## 2021-09-25 NOTE — Progress Notes (Signed)
SLP Cancellation Note ? ?Patient Details ?Name: Jacob [name] ?MRN: [name] ?DOB: August 20, 1975 ? ? ?Cancelled treatment:       Reason Eval/Treat Not Completed: Patient not medically ready per discussion with RN. Will f/u as able.  ? ? ? ?Osie Bond., M.A. CCC-SLP ?Acute Rehabilitation Services ?Office [phone] ? ?Secure chat preferred ? ?09/25/2021, 9:41 AM ?

## 2021-09-26 DIAGNOSIS — I10 Essential (primary) hypertension: Secondary | ICD-10-CM | POA: Diagnosis not present

## 2021-09-26 DIAGNOSIS — I63511 Cerebral infarction due to unspecified occlusion or stenosis of right middle cerebral artery: Secondary | ICD-10-CM | POA: Diagnosis not present

## 2021-09-26 DIAGNOSIS — I639 Cerebral infarction, unspecified: Secondary | ICD-10-CM | POA: Diagnosis not present

## 2021-09-26 DIAGNOSIS — G936 Cerebral edema: Secondary | ICD-10-CM | POA: Diagnosis not present

## 2021-09-26 LAB — CBC WITH DIFFERENTIAL/PLATELET
Abs Immature Granulocytes: 0.07 10*3/uL (ref 0.00–0.07)
Basophils Absolute: 0 10*3/uL (ref 0.0–0.1)
Basophils Relative: 0 %
Eosinophils Absolute: 0.2 10*3/uL (ref 0.0–0.5)
Eosinophils Relative: 1 %
HCT: 31.6 % — ABNORMAL LOW (ref 39.0–52.0)
Hemoglobin: 10.1 g/dL — ABNORMAL LOW (ref 13.0–17.0)
Immature Granulocytes: 1 %
Lymphocytes Relative: 8 %
Lymphs Abs: 1.2 10*3/uL (ref 0.7–4.0)
MCH: 28.8 pg (ref 26.0–34.0)
MCHC: 32 g/dL (ref 30.0–36.0)
MCV: 90 fL (ref 80.0–100.0)
Monocytes Absolute: 1.3 10*3/uL — ABNORMAL HIGH (ref 0.1–1.0)
Monocytes Relative: 9 %
Neutro Abs: 11.2 10*3/uL — ABNORMAL HIGH (ref 1.7–7.7)
Neutrophils Relative %: 81 %
Platelets: 210 10*3/uL (ref 150–400)
RBC: 3.51 MIL/uL — ABNORMAL LOW (ref 4.22–5.81)
RDW: 14.3 % (ref 11.5–15.5)
WBC: 13.9 10*3/uL — ABNORMAL HIGH (ref 4.0–10.5)
nRBC: 0 % (ref 0.0–0.2)

## 2021-09-26 LAB — GLUCOSE, CAPILLARY
Glucose-Capillary: 149 mg/dL — ABNORMAL HIGH (ref 70–99)
Glucose-Capillary: 154 mg/dL — ABNORMAL HIGH (ref 70–99)
Glucose-Capillary: 154 mg/dL — ABNORMAL HIGH (ref 70–99)
Glucose-Capillary: 161 mg/dL — ABNORMAL HIGH (ref 70–99)
Glucose-Capillary: 167 mg/dL — ABNORMAL HIGH (ref 70–99)
Glucose-Capillary: 180 mg/dL — ABNORMAL HIGH (ref 70–99)

## 2021-09-26 LAB — VANCOMYCIN, PEAK: Vancomycin Pk: 41 ug/mL — ABNORMAL HIGH (ref 30–40)

## 2021-09-26 LAB — RENAL FUNCTION PANEL
Albumin: 2 g/dL — ABNORMAL LOW (ref 3.5–5.0)
Anion gap: 8 (ref 5–15)
BUN: 48 mg/dL — ABNORMAL HIGH (ref 6–20)
CO2: 23 mmol/L (ref 22–32)
Calcium: 8.4 mg/dL — ABNORMAL LOW (ref 8.9–10.3)
Chloride: 120 mmol/L — ABNORMAL HIGH (ref 98–111)
Creatinine, Ser: 1.23 mg/dL (ref 0.61–1.24)
GFR, Estimated: >60 mL/min (ref >60)
Glucose, Bld: 163 mg/dL — ABNORMAL HIGH (ref 70–99)
Phosphorus: 4 mg/dL (ref 2.5–4.6)
Potassium: 3.4 mmol/L — ABNORMAL LOW (ref 3.5–5.1)
Sodium: 151 mmol/L — ABNORMAL HIGH (ref 135–145)

## 2021-09-26 MED ORDER — VITAL 1.5 CAL PO LIQD
1000.0000 mL | ORAL | Status: DC
  Administered 2021-09-26 – 2021-09-29 (×3): 1000 mL

## 2021-09-26 MED ORDER — POTASSIUM CHLORIDE 20 MEQ PO PACK
40.0000 meq | PACK | Freq: Once | ORAL | Status: AC
  Administered 2021-09-26: 40 meq
  Filled 2021-09-26: qty 2

## 2021-09-26 NOTE — Progress Notes (Signed)
? ?NAME:  Jacob [name], MRN:  [name], DOB:  [birth year], LOS: 66 ?ADMISSION DATE:  09/12/2021, CONSULTATION DATE:  09/15/2021 ?REFERRING MD:  Leonie Man - Stroke, CHIEF COMPLAINT:  Cerebral edema.   ? ?History of Present Illness:  ?46 year old man with worsening cerebral edema. ? ?He presented 4/21 with left-sided weakness which was initially waxing and waning. CTA showed right M1 occlusion.  Further decline post TNK brought for thrombectomy at TICI 3 revascularization of right M2. ?Transfer to floor but experienced neurological decline 4/24.  CT showed malignant cerebral edema with 12 mm right to left midline shift and Trapping of the temporal horn. ? ?He was brought back to the ICU and started on 3% hypertonic saline and prepped for the operating room.  Platelets were transfused to offset effect of clopidogrel and aspirin. ? ?Taken to OR and underwent right frontotemporal parietal hemicraniectomy with placement of bone flap in abdominal subcutaneous tissue. Returned to ICU on vent. ? ?Pertinent  Medical History  ? ?Past Medical History:  ?Diagnosis Date  ? Allergy   ? Diabetes mellitus without complication ([place])   ? Hypertension   ? ?Significant Hospital Events: ?Including procedures, antibiotic start and stop dates in addition to other pertinent events   ?4/24 decompressive craniectomy. ?4/25 decreased midline shift on CT ?4/26 still on HT saline.  Switched from prop to precedex. BP meds titrated up in effort to decrease cleviprex. Cortrak placed.  ?4/27 Cleviprex maxed out, started on labetolol gtt. Started scheduled clonidine, also started coreg. Foley replaced and started on doxazosin due to urinary retention. HT infusion cut in half w/ plan to work towards stopping. Stopped fent gtt. Changed to PRN w/ plan to start precedex if needed.  Checking [place] of UE to eval fever. Also sending PCT ?5/1 trached, c/o pain upper abd, tmax 101.3, diarrhea overnight ?5/2 febrile 102.7, WBC up, c/o ongoing abd pain/distention, KUB  c/w ileus, cultured and started on vanc/ cefepime, NGT placed for decompression. ?5/3 suspected VZV infection. Added acyclovir  ?5/4 failed SBT with apnea  ?5/5 working with PT  ? ?Interim History / Subjective:  ?Working with PT  ?Still spiking some fevers  ? ?Objective   ?Blood pressure 132/90, pulse 95, temperature (!) 101.5 ?F (38.6 ?C), temperature source Axillary, resp. rate (!) 21, height 6\' 1"  (1.854 m), weight 102.4 kg, SpO2 98 %. ?   ?Vent Mode: PRVC ?FiO2 (%):  [30 %] 30 % ?Set Rate:  [18 bmp] 18 bmp ?Vt Set:  [id number] mL] 630 mL ?PEEP:  [5 cmH20] 5 cmH20 ?Plateau Pressure:  [16 cmH20-26 cmH20] 26 cmH20  ? ?Intake/Output Summary (Last 24 hours) at 09/26/2021 1204 ?Last data filed at 09/26/2021 1115 ?Gross per 24 hour  ?Intake 2746.15 ml  ?Output 1830 ml  ?Net 916.15 ml  ? ?Filed Weights  ? 09/21/21 0500 09/25/21 0326 09/26/21 0500  ?Weight: 110 kg 104.2 kg 102.4 kg  ? ?Examination: ?General:  Critically ill Middle aged M trach vent NAD  ?HEENT: trach secure tan secretions   ?Neuro:  Following R sided commands  ?CV: rrr s1s2 no rgm ?PULM: Mechanically ventilated. Improving rhonchi  ?GI: soft ndnt + bowel sounds  ?Extremities: No acute deformity no cyanosis or clubbing  ?Skin: warm, clean dry  ? ? ?Assessment & Plan:  ? ?R MCA CVA s/p tpa and mechanical thrombectomy, c/b cerebral edema ("brain compression"), s/p decompressive crani (4/24) ?P ?-minimize sedation  ?-stroke per neuro  ?-flap in R abdomen  ?-amantadine ?-keppra  ?- cont ASA/  lipitor  ?- Delirium precautions ? ?Acute hypoxic respiratory failure ?S/p trach ?MRSA PNA  ?- s/p trach 5/1 ?P ?-wean vent support as able  ?-goal ATC  ?-pulm hygiene ?-vanc  ? ?Suspected sepsis due to MRSA PNA and suspected VZV infection  ?P ?-acyclovir, vanc  ?-trend WBC, fever curve  ?-VZV PCR  ? ?AKI, improving ?Hypernatremia, stable  ?Hypokalemia  ?P ?-allowing Na slowly normalize  ?-FWF on hold while NPO, on low rate d5LR  ?-replace K ?-trend renal indices, UOP ?-cont mIVF --  on LR at 100/hr. At risk for worsening renal fxn with antiviral  ? ?HTN ?P ?-lisinopril, clonidine, coreg, norvasc  ? ?Ileus improving  ?P ?-cont bowel reg ?-advance EN to trickle 5/5  ? ?DM with hyperglycemia  ?- basal, SSI  ? ? ?Best Practice (right click and "Reselect all SmartList Selections" daily)  ? ?Diet/type: tubefeeds- will start trickle 5/5 ?DVT prophylaxis: LMWH ?GI prophylaxis: PPI ?Lines: N/A ?Foley:  N/A  ?Code Status:  full code ?Last date of multidisciplinary goals of care discussion [Wife updated at bedside 5/5] ? ? ?CRITICAL CARE ?Performed by: Cristal Generous ? ?Total critical care time: 38 minutes ? ?Critical care time was exclusive of separately billable procedures and treating other patients. ?Critical care was necessary to treat or prevent imminent or life-threatening deterioration. ? ?Critical care was time spent personally by me on the following activities: development of treatment plan with patient and/or surrogate as well as nursing, discussions with consultants, evaluation of patient's response to treatment, examination of patient, obtaining history from patient or surrogate, ordering and performing treatments and interventions, ordering and review of laboratory studies, ordering and review of radiographic studies, pulse oximetry and re-evaluation of patient's condition. ? ?Eliseo Gum MSN, AGACNP-BC ?[facility] ?Amion for pager  ?09/26/2021, 12:04 PM ? ? ?

## 2021-09-26 NOTE — Progress Notes (Signed)
Physical Therapy Treatment ?Patient Details ?Name: Jacob [name] ?MRN: [id number] ?DOB: [birth year] ?Today's Date: 09/26/2021 ? ? ?History of Present Illness 46 y.o. male presents to [facility] hospital on 09/12/2021 with L facial droop and arm weakness. CTA revealed R M1 occlusion. Pt received tNK. Pt underwent thrombectomy on 4/21. Underwent R frontotemporal parietal hemicraniectomy on 4/24 after declining 4/23. Trach placement 09/22/2021.  PMH includes HTN and DM. ? ?  ?PT Comments  ? ? Pt tolerates treatment well, demonstrating good command following with R side. Pt is able to perform cervical rotation to right side and makes attempts to shift gaze in a leftward direction, although R gaze preference remains prevalent. Pt remains flaccid in L extremities at this time, no response to pain noted from LLE. Pt is able to utilize railing to adjust posture in bed and shift upper body. Pt will benefit from continued acute PT services in an effort to improve mobility quality and to reduce caregiver burden.   ?Recommendations for follow up therapy are one component of a multi-disciplinary discharge planning process, led by the attending physician.  Recommendations may be updated based on patient status, additional functional criteria and insurance authorization. ? ?Follow Up Recommendations ? PT at Long-term acute care hospital ?  ?  ?Assistance Recommended at Discharge Frequent or constant Supervision/Assistance  ?Patient can return home with the following Two people to help with walking and/or transfers;Two people to help with bathing/dressing/bathroom;Assistance with cooking/housework;Assistance with feeding;Direct supervision/assist for medications management;Direct supervision/assist for financial management;Assist for transportation;Help with stairs or ramp for entrance ?  ?Equipment Recommendations ? Wheelchair (measurements PT);Wheelchair cushion (measurements PT);BSC/3in1;Hospital bed;Other (comment) (hoyer lift)  ?   ?Recommendations for Other Services   ? ? ?  ?Precautions / Restrictions Precautions ?Precautions: Fall ?Precaution Comments: trach, intubated, R crani with bone flap in abdomen, airborne precautions ?Restrictions ?Weight Bearing Restrictions: No  ?  ? ?Mobility ? Bed Mobility ?Overal bed mobility: Needs Assistance ?  ?  ?  ?  ?  ?  ?General bed mobility comments: pt transitions from semi-fowlers to bed egress position. Pt is able to pull trunk forward off bed with maxA x2, pt also able to reposition, pulling with RUE on bed rail to shift to a more upright position ?  ? ?Transfers ?  ?  ?  ?  ?  ?  ?  ?  ?  ?  ?  ? ?Ambulation/Gait ?  ?  ?  ?  ?  ?  ?  ?  ? ? ?Stairs ?  ?  ?  ?  ?  ? ? ?Wheelchair Mobility ?  ? ?Modified Rankin (Stroke Patients Only) ?Modified Rankin (Stroke Patients Only) ?Pre-Morbid Rankin Score: No symptoms ?Modified Rankin: Severe disability ? ? ?  ?Balance Overall balance assessment: Needs assistance ?Sitting-balance support: Bilateral upper extremity supported, Feet supported ?Sitting balance-Leahy Scale: Poor ?Sitting balance - Comments: maxA after transitioning from bed egress and pulling trunk forward into sitting ?Postural control: Posterior lean ?  ?  ?  ?  ?  ?  ?  ?  ?  ?  ?  ?  ?  ?  ?  ? ?  ?Cognition Arousal/Alertness: Awake/alert ?Behavior During Therapy: Flat affect ?Overall Cognitive Status: Difficult to assess ?Area of Impairment: Following commands, Problem solving ?  ?  ?  ?  ?  ?  ?  ?  ?  ?  ?  ?Following Commands: Follows one step commands with increased time ?  ?  ?  Problem Solving: Slow processing, Requires verbal cues, Requires tactile cues ?  ?  ?  ? ?  ?Exercises General Exercises - Lower Extremity ?Ankle Circles/Pumps: AROM, Right, 5 reps ?Long Arc Quad: AROM, Right, 10 reps ?Other Exercises ?Other Exercises: active R cervical rotation in an effort to improve ROM and reduce risk of left cervical rotation contracture ? ?  ?General Comments General comments (skin  integrity, edema, etc.): pt on 30% FiO2 5 PEEP trach to vent, VSS during session. Pt continues to demonstrate R gaze deviation ?  ?  ? ?Pertinent Vitals/Pain Pain Assessment ?Pain Assessment: CPOT ?Facial Expression: Relaxed, neutral ?Body Movements: Absence of movements ?Muscle Tension: Relaxed ?Compliance with ventilator (intubated pts.): Coughing but tolerating ?Vocalization (extubated pts.): N/A ?CPOT Total: 1  ? ? ?Home Living   ?  ?  ?  ?  ?  ?  ?  ?  ?  ?   ?  ?Prior Function    ?  ?  ?   ? ?PT Goals (current goals can now be found in the care plan section) Acute Rehab PT Goals ?Patient Stated Goal: family goal to regain prior level of function ?Progress towards PT goals: Progressing toward goals ? ?  ?Frequency ? ? ? Min 2X/week ? ? ? ?  ?PT Plan Current plan remains appropriate  ? ? ?Co-evaluation PT/OT/SLP Co-Evaluation/Treatment: Yes ?Reason for Co-Treatment: Complexity of the patient's impairments (multi-system involvement);Necessary to address cognition/behavior during functional activity;For patient/therapist safety ?PT goals addressed during session: Mobility/safety with mobility;Balance;Strengthening/ROM ?  ?  ? ?  ?AM-PAC PT "6 Clicks" Mobility   ?Outcome Measure ? Help needed turning from your back to your side while in a flat bed without using bedrails?: Total ?Help needed moving from lying on your back to sitting on the side of a flat bed without using bedrails?: Total ?Help needed moving to and from a bed to a chair (including a wheelchair)?: Total ?Help needed standing up from a chair using your arms (e.g., wheelchair or bedside chair)?: Total ?Help needed to walk in hospital room?: Total ?Help needed climbing 3-5 steps with a railing? : Total ?6 Click Score: 6 ? ?  ?End of Session Equipment Utilized During Treatment: Oxygen ?Activity Tolerance: Patient tolerated treatment well ?Patient left: in bed;with call bell/phone within reach;with family/visitor present ?Nurse Communication: Mobility  status ?PT Visit Diagnosis: Other abnormalities of gait and mobility (R26.89);Muscle weakness (generalized) (M62.81);Other symptoms and signs involving the nervous system (R29.898);Hemiplegia and hemiparesis ?Hemiplegia - Right/Left: Left ?Hemiplegia - dominant/non-dominant: Non-dominant ?Hemiplegia - caused by: Cerebral infarction ?  ? ? ?Time: [id number] ?PT Time Calculation (min) (ACUTE ONLY): 43 min ? ?Charges:  $Therapeutic Activity: 8-22 mins          ?          ? ?Zenaida Niece, PT, DPT ?Acute Rehabilitation ?Pager: [phone] ?Office [phone] ? ? ? ?Zenaida Niece ?09/26/2021, 12:50 PM ? ?

## 2021-09-26 NOTE — Progress Notes (Signed)
Occupational Therapy Treatment ?Patient Details ?Name: Jacob [name] ?MRN: [id number] ?DOB: 04-Jan-1976 ?Today's Date: 09/26/2021 ? ? ?History of present illness 46 y.o. male presents to [facility] hospital on 09/12/2021 with L facial droop and arm weakness. CTA revealed R M1 occlusion. Pt received tNK. Pt underwent thrombectomy on 4/21. Underwent R frontotemporal parietal hemicraniectomy on 4/24 after declining 4/23. CT + Right MCA/PCA infarct. Trach placement 09/22/2021.  PMH includes HTN and DM. ?  ?OT comments ? Pt seen as cotreat with PT to progress mobility. Pt with R gaze and unable to track to midline at this time, however following commands consistently. Assisting with simple self care tasks @ bed level, repositioning self in bed suing R UE to push against rail and able to pull trunk off bed in chair position while maintaining head control. Pt suctioned at beginning of session and VSS throughout. Wife present and very supportive. If pt able to wean from vent, given tolerance for session today, he may become appropriate for AIR. May assess use of glasses to block R visual field in effort to bring gaze toward midline next session.will continue to follow.   ? ?Recommendations for follow up therapy are one component of a multi-disciplinary discharge planning process, led by the attending physician.  Recommendations may be updated based on patient status, additional functional criteria and insurance authorization. ?   ?Follow Up Recommendations ? OT at Long-term acute care hospital (If pt progresses, will be appropriate for CIR)  ?  ?Assistance Recommended at Discharge Frequent or constant Supervision/Assistance  ?Patient can return home with the following ? Two people to help with walking and/or transfers;Two people to help with bathing/dressing/bathroom;Assistance with cooking/housework;Assistance with feeding;Direct supervision/assist for medications management;Direct supervision/assist for financial management;Assist for  transportation;Help with stairs or ramp for entrance ?  ?Equipment Recommendations ? Wheelchair (measurements OT);Wheelchair cushion (measurements OT);Hospital bed  ?  ?Recommendations for Other Services Rehab consult ? ?  ?Precautions / Restrictions Precautions ?Precautions: Fall ?Precaution Comments: trach, intubated, R crani with bone flap in abdomen, airborne precautions ?Restrictions ?Weight Bearing Restrictions: No  ? ? ?  ? ?Mobility Bed Mobility ?Overal bed mobility: Needs Assistance ?  ?  ?  ?  ?  ?  ?General bed mobility comments: pt transitions from semi-fowlers to bed egress position. Pt is able to pull trunk forward off bed with maxA x2, pt also able to reposition, pulling with RUE on bed rail to shift to a more upright position ?  ? ?Transfers ?  ?  ?  ?  ?  ?  ?  ?  ?  ?General transfer comment: not attempted this date ?  ?  ?Balance Overall balance assessment: Needs assistance ?Sitting-balance support: Bilateral upper extremity supported, Feet supported ?Sitting balance-Leahy Scale: Poor ?Sitting balance - Comments: maxA after transitioning from bed egress and pulling trunk forward into sitting ?Postural control: Posterior lean ?  ?  ?  ?  ?  ?  ?  ?  ?  ?  ?  ?  ?  ?  ?   ? ?ADL either performed or assessed with clinical judgement  ? ?ADL Overall ADL's : Needs assistance/impaired ?  ?  ?Grooming: Wash/dry face;Minimal assistance ?Grooming Details (indicate cue type and reason): pt using yonker to help suction mouth ?Upper Body Bathing: Bed level;Maximal assistance ?  ?Lower Body Bathing: Maximal assistance;Bed level ?Lower Body Bathing Details (indicate cue type and reason): RLE flexied and pt washing upper and lower leg ?Upper Body Dressing : Total  assistance ?  ?Lower Body Dressing: Total assistance ?  ?  ?  ?Toileting- Clothing Manipulation and Hygiene: Total assistance ?  ?  ?  ?Functional mobility during ADLs: Maximal assistance;+2 for physical assistance (bed level) ?  ?  ? ?Extremity/Trunk  Assessment Upper Extremity Assessment ?Upper Extremity Assessment: LUE deficits/detail;RUE deficits/detail ?RUE Deficits / Details: generalized weakness; ablet o lift off bed and place on bedrail; ableto pull against therapist to right welf to midlien ; gross/grasp/release; decreased fine motor ?RUE Coordination: decreased fine motor;decreased gross motor ?LUE Deficits / Details: flaccid, increased edema; lesions/blisters on arm ?LUE Coordination:  (non-functional) ?  ?Lower Extremity Assessment ?Lower Extremity Assessment: Defer to PT evaluation ?  ?  ?  ? ?Vision   ?Vision Assessment?: Vision impaired- to be further tested in functional context ?Ocular Range of Motion: Restricted on the left ?Alignment/Gaze Preference: Gaze right ?Tracking/Visual Pursuits: Unable to hold eye position out of midline;Decreased smoothness of horizontal tracking;Requires cues, head turns, or add eye shifts to track ?Visual Fields: Left visual field deficit ?Depth Perception: Overshoots;Undershoots ?Additional Comments: R gaze; does not track  however to sustain gaze on object ?  ?Perception Perception ?Perception: Impaired (L neglect) ?  ?Praxis Praxis ?Praxis:  (will further assess) ?  ? ?Cognition Arousal/Alertness: Awake/alert ?Behavior During Therapy: Flat affect ?Overall Cognitive Status: Difficult to assess ?Area of Impairment: Following commands, Problem solving, Attention ?  ?  ?  ?  ?  ?  ?  ?  ?  ?Current Attention Level: Sustained ?  ?Following Commands: Follows one step commands with increased time ?  ?  ?Problem Solving: Slow processing, Requires verbal cues, Requires tactile cues ?  ?  ?  ?   ?Exercises Exercises: Other exercises, General Lower Extremity, General Upper Extremity ?General Exercises - Upper Extremity ?Shoulder Flexion: Left, PROM, 5 reps ?Shoulder ABduction: PROM, Left, 5 reps ?Elbow Flexion: PROM, Left, 5 reps ?Elbow Extension: PROM, Left, 5 reps ?Wrist Flexion: PROM, Left, 5 reps, Seated ?Digit Composite  Flexion: PROM, Left, 5 reps, Seated ?Composite Extension: PROM, Left, 10 reps ?Other Exercises ?Other Exercises: pulling on rail with R hand to right self to midline ? ?  ?Shoulder Instructions   ? ? ?  ?General Comments    ? ? ?Pertinent Vitals/ Pain       Pain Assessment ?Pain Assessment: Faces ?Faces Pain Scale: Hurts little more ?Pain Location: generalized ?Pain Descriptors / Indicators: Grimacing (gesturing) ?Pain Intervention(s): Limited activity within patient's tolerance ? ?Home Living   ?  ?  ?  ?  ?  ?  ?  ?  ?  ?  ?  ?  ?  ?  ?  ?  ?  ?  ? ?  ?Prior Functioning/Environment    ?  ?  ?  ?   ? ?Frequency ? Min 2X/week  ? ? ? ? ?  ?Progress Toward Goals ? ?OT Goals(current goals can now be found in the care plan section) ? Progress towards OT goals: Progressing toward goals;Goals drowngraded-see care plan ? ?Acute Rehab OT Goals ?Patient Stated Goal: per wife, for her husband to get better ?OT Goal Formulation: With family ?Time For Goal Achievement: 10/10/21 ?Potential to Achieve Goals: Good ?ADL Goals ?Pt Will Perform Grooming: with min assist;sitting ?Pt Will Perform Upper Body Bathing: with mod assist ?Additional ADL Goal #1: Pt will maintain midline postural control EOB with mod A in preparation for ADL tasks ?Additional ADL Goal #2: Pt will track to midline 1/3 trials  to increase visual scanning for ADL tasks  ?Plan Discharge plan remains appropriate;Frequency needs to be updated (will further assess)   ? ?Co-evaluation ? ? ? PT/OT/SLP Co-Evaluation/Treatment: Yes ?Reason for Co-Treatment: Complexity of the patient's impairments (multi-system involvement);For patient/therapist safety;To address functional/ADL transfers ?  ?OT goals addressed during session: ADL's and self-care;Strengthening/ROM ?  ? ?  ?AM-PAC OT "6 Clicks" Daily Activity     ?Outcome Measure ? ? Help from another person eating meals?: Total ?Help from another person taking care of personal grooming?: A Lot ?Help from another person  toileting, which includes using toliet, bedpan, or urinal?: Total ?Help from another person bathing (including washing, rinsing, drying)?: A Lot ?Help from another person to put on and taking off regular u

## 2021-09-26 NOTE — Progress Notes (Signed)
? ?  Providing Compassionate, Quality Care - Together ? ? ?Subjective: ?Nurse reports patient now on airborne precautions due to suspicion for (VZV) chicken pox. Patient started on acyclovir. ? ?Objective: ?Vital signs in last 24 hours: ?Temp:  [99.2 ?F (37.3 ?C)-102.4 ?F (39.1 ?C)] 101.5 ?F (38.6 ?C) (05/05 1115) ?Pulse Rate:  [82-111] 95 (05/05 1115) ?Resp:  [13-28] 21 (05/05 1115) ?BP: (111-171)/(74-108) 132/90 (05/05 1100) ?SpO2:  [94 %-100 %] 98 % (05/05 1115) ?FiO2 (%):  [30 %] 30 % (05/05 0803) ?Weight:  [102.4 kg] 102.4 kg (05/05 0500) ? ?Intake/Output from previous day: ?05/04 0701 - 05/05 0700 ?In: 3635.3 [I.V.:2487.2; NG/GT:150; IV Piggyback:998.2] ?Out: [id number] [Urine:930; Emesis/NG output:800] ?Intake/Output this shift: ?Total I/O ?In: -  ?Out: 600 [Urine:600] ? ?Responds to voice ?Intubated ?PERRLA, 3 round sluggish ?Right gaze preference ?Follows commands RUE, RLE ?No movement in LUE, LLE ?Craniotomy wound healing well ? ?Lab Results: ?Recent Labs  ?  09/25/21 ?0411 09/26/21 ?[id number]  ?WBC 17.2* 13.9*  ?HGB 10.5* 10.1*  ?HCT 32.9* 31.6*  ?PLT 173 210  ? ?BMET ?Recent Labs  ?  09/25/21 ?0411 09/26/21 ?0405  ?NA 152* 151*  ?K 3.7 3.4*  ?CL 119* 120*  ?CO2 22 23  ?GLUCOSE 150* 163*  ?BUN 57* 48*  ?CREATININE 1.47* 1.23  ?CALCIUM 8.8* 8.4*  ? ? ?Studies/Results: ?No results found. ? ?Assessment/Plan: ?Patient suffered large right ischemic MCA stroke, resulting in significant cerebral edema on 09/12/2021. Dr. Arnoldo Morale performed a right hemicraniectomy on 09/15/2021 for decompression. Patient is following commands on the right. ? ? LOS: 14 days  ? ?-Continue supportive efforts. ?-Can replace craniotomy flap once swelling decreases further. ? ? ?Viona Gilmore, DNP, AGNP-C ?Nurse Practitioner ? ?[facility] ?[address], [address], [place], [place] [address] ?PRN:[id number]    FTJ:[id number] ? ?09/26/2021, 12:03 PM ? ? ? ? ?

## 2021-09-26 NOTE — Progress Notes (Signed)
Nutrition Follow-up ? ?DOCUMENTATION CODES:  ? ?Not applicable ? ?INTERVENTION:  ? ?Once able to resume Tube feeding via cortrak tube: ?Vital 1.5 at 20 ml/h (1440 ml per day) ? ? ?If pt tolerates TF advancement recommend goal of: ?Vital 1.5 @ 60 ml/hr  ? ?Recommend Prosource TF 45 ml BID ? ?Provides 2240 kcal, 119 gm protein, 1094 ml free water daily ? ? ?NUTRITION DIAGNOSIS:  ? ?Inadequate oral intake related to inability to eat as evidenced by NPO status. ?Ongoing.  ? ?GOAL:  ? ?Patient will meet greater than or equal to 90% of their needs ?Not met, TF at trickle  ? ?MONITOR:  ? ?TF tolerance ? ?REASON FOR ASSESSMENT:  ? ?Consult ?Enteral/tube feeding initiation and management ? ?ASSESSMENT:  ? ?Pt with PMH of HTN and DM admitted with R M1 occlusion s/p TNK and thrombectomy. ? ?Pt discussed during ICU rounds and with RN.  ?Pt on vent support via trach.  ?Suspected VZV infection, PCR pending ?NG output down from 2800 ml to 800 ml per 24 hours.  ?Spoke with NP, ok to trial trickle TF ? ? ?4/24 s/p R frontotemporal parietal hemicraniectomy, bone flap in abd ?4/25 TF initiated  ?4/26 s/p cortrak placement; tip gastric  ?5/1 s/p trach placement; developed ileus and TF held ?5/5 restart TF at trickle ? ? ? ?Medications reviewed and include: dulcolax, SSI, novolog, levemir, reglan 5 mg every 6 hours, protonix ?LR @ 100 ml/hr  ?D5 in LR @ 25 ml/hr  ? ? ?Labs reviewed: Na 151, K 3.4 ?A1C: 8.1 (4/21) ?CBG's: 90-180 ? ? ?13 F NG tube for suction - 800 ml x 24 hours  ? ? ?Diet Order:   ?Diet Order   ? ?       ?  Diet NPO time specified  Diet effective now       ?  ? ?  ?  ? ?  ? ? ?EDUCATION NEEDS:  ? ?Not appropriate for education at this time ? ?Skin:  Skin Assessment: Reviewed RN Assessment (head incision, abd incision for bone flap) ? ?Last BM:  5/5 x 5 ? ?Height:  ? ?Ht Readings from Last 1 Encounters:  ?09/12/21 6' 1"  (1.854 m)  ? ? ?Weight:  ? ?Wt Readings from Last 1 Encounters:  ?09/26/21 102.4 kg  ? ? ?BMI:  Body  mass index is 29.78 kg/m?. ? ?Estimated Nutritional Needs:  ? ?Kcal:  2100-2500 ? ?Protein:  110-125 grams ? ?Fluid:  >2 L/day ? ?Lockie Pares., RD, LDN, CNSC ?See AMiON for contact information  ? ?

## 2021-09-26 NOTE — Progress Notes (Addendum)
STROKE TEAM PROGRESS NOTE  ? ?INTERVAL HISTORY ?Patient is seen in his room with his wife at the bedside.  He reportedly had a good session with PT and OT today.  He was febrile again overnight and today with a Tmax of 38.6 C.  WBC down to 13.9.  On exam he is following commands but appears tired.  Continue weaning trials as tolerated, CCM remains on board for respiratory/pneumonia management. ? ?Vitals:  ? 09/26/21 1115 09/26/21 1200 09/26/21 1215 09/26/21 1300  ?BP:  132/87 132/87 115/76  ?Pulse: 95 99 96   ?Resp: (!) 21 (!) 24 18 18   ?Temp: (!) 101.5 ?F (38.6 ?C) (!) 101.5 ?F (38.6 ?C)    ?TempSrc: Axillary Axillary    ?SpO2: 98% 99% 96%   ?Weight:      ?Height:      ? ?CBC:  ?Recent Labs  ?Lab 09/25/21 ?0411 09/26/21 ?0405  ?WBC 17.2* 13.9*  ?NEUTROABS  --  11.2*  ?HGB 10.5* 10.1*  ?HCT 32.9* 31.6*  ?MCV 91.1 90.0  ?PLT 173 210  ? ? ?Basic Metabolic Panel:  ?Recent Labs  ?Lab 09/20/21 ?1043 09/21/21 ?0206 09/25/21 ?0411 09/26/21 ?0405  ?NA  --    < > 152* 151*  ?K  --    < > 3.7 3.4*  ?CL  --    < > 119* 120*  ?CO2  --    < > 22 23  ?GLUCOSE  --    < > 150* 163*  ?BUN  --    < > 57* 48*  ?CREATININE  --    < > 1.47* 1.23  ?CALCIUM  --    < > 8.8* 8.4*  ?MG 2.6*  --  2.8*  --   ?PHOS 3.1  --  4.1 4.0  ? < > = values in this interval not displayed.  ? ? ?Lipid Panel:  ?No results for input(s): CHOL, TRIG, HDL, CHOLHDL, VLDL, LDLCALC in the last 168 hours. ? ?HgbA1c:  ?No results for input(s): HGBA1C in the last 168 hours. ? ?Urine Drug Screen:  ?No results for input(s): LABOPIA, COCAINSCRNUR, LABBENZ, AMPHETMU, THCU, LABBARB in the last 168 hours. ?  ?Alcohol Level No results for input(s): ETH in the last 168 hours. ? ?IMAGING past 24 hours ?No results found. ? ?PHYSICAL EXAM ? ?Physical Exam  ?Constitutional: Appears well-developed and well-nourished middle-age is sedated and intubated African-American male, intubated hemi craniectomy surgical incision with clips seen on the right side ?Cardiovascular: Normal  rate and regular rhythm.  ?Respiratory: Respirations synchronous with ventilator ? ?Neuro (intubated without sedation): Right gaze deviation, blink to threat. Corneal intact bilateral.   Following commands on the right.  PERRL,  oculocephalic reflex, cough and gag reflexes present.  ? ?ASSESSMENT/PLAN ?Jacob [name] is a 46 y.o. male with history of HTN, DM2 presenting with left side facial droop, left arm weakness. Received TNKase. Neuro status declined during MRI (NIHSS 13),taken for thrombectomy with Dr. Karenann Cai. TICI3 recanalization of the right M2/MCA achieved.  MRI/MRA shows reoccluded right M2 branch with a right MCA territory infarct.  Continue with PT/OT/SLP evals.  Home blood pressure medications resumed. BP goal less than 160. Topamax and Tylenol #3 for headache management.  Head CT shows new 82mm leftward midline shift. Trasnferred back to ICU, decompressive hemicraniectomy performed by neurosurgery, bone flap placed in abdomen.  Follow-up CT shows 5 mm left midline shift.   HTS d/c'd due to Na of 160.  Tracheostomy placed 5/1.  Now febrile, empiric antibiotic started by CCM.  Tracheal aspirate cultured and blood culture sent.  Blood cultures show no growth, respiratory culture shows MRSA.  Varicella PCR pending ? ?Stroke:  Right MCA due to right MCA occlusion  s/p TNK and IR with TICI3, etiology not certain, but most likely cryptogenic versus intracranial atherosclerosis. ?Code Stroke CT head No acute abnormality ?CTA head & neck R short segment M1 occlusion, left distal M1 high grade stenosis ?Post IR CT No hemorrhagic complication on flat panel CT. ?4/21- Early MRI Tiny acute infarcts in the upper division right MCA cortex. ?4/22- MRI right MCA territory infarct involving the cortex, sparing the basal ganglia.  2 mm midline shift.  No hemorrhage ?4/22- MRA reoccluded right M2 branch, intracranial atherosclerosis including advanced left M1 stenosis ?4/24-  Head CT-increased edema  resulting in near complete effacement of the right lateral ventricle and 12 mm leftward midline shift ?4/25- Head CT Post OP- 5 mm left midline shift, new area of hypoattenuation of the left middle cerebellar peduncle and cerebellum ?4/28 head CT large right MCA infarct with no HT, diminished mass effect with no midline shift, hemicraniectomy noted ?5/3 CT head stable, no midline shift, large right MCA infarct ?2D Echo EF > 75% ?LDL 96 ?HgbA1c 8.1 ?UDS negative ?VTE prophylaxis - SCDs ?No antithrombotic prior to admission, now on ASA 81.  ?Therapy recommendations: PT/OT at [facility] ?disposition:  Pending ? ?Cerebral Edema s/p [facility] ?Head CT 4/24 shows increased edema with new 55mm midline shift ?Decompressive hemicraniectomy performed 4/24 by neurosurgery ?Remove staples on postop day 10 ?Na 155->160->159-> 156->153-> 150-> 152->151 ?Off HTS->FW->LR @ 100 ?No midline shift seen on head CT 4/28 and 5/3 ?On keppra 500mg  bid ?Allow Na gradually trending down ? ?Intracranial stenosis ?CTA head & neck R short segment M1 occlusion, left distal M1 high grade stenosis ?No significant extracranial stenosis ?Most likely related to long standing uncontrolled risk factors. ? ?Hypertension ?Home meds:  Amlodipine 10mg , lisinopril 10mg -resumed ?Stable ?BP goal <160  ?On coreg 25, amlodipine 10, clonidine 0.2 Q8, lisinopril 40  ?PRN labetalol and hydralazine ?Long-term BP goal normotensive ? ?Hyperlipidemia ?Home meds:  Atorvastatin 20mg  ?LDL 96, goal < 70 ?Put on lipitor 40 ?Continue statin at discharge ? ?Diabetes type II Uncontrolled ?Home meds:  Glipizide, metformin ?HgbA1c 8.1, goal < 7.0 ?CBGs ?SSI with resist scale, Levemir 11->40 units twice daily ?DM coordinator consulted ? ?Other Stroke Risk Factors ?Obesity, Body mass index is 29.78 kg/m?., BMI >/= 30 associated with increased stroke risk, recommend weight loss, diet and exercise as appropriate  ? ?Dysphagia  ?Ileus ?N.p.o. postop, still intubated ?Cortrak in place ?Gastric  tube for decompression per CCM ?KUB 5/3 unchanged ?On LR @ 100 and D5 @ 25 ?On trickle feeds now @ 20  ? ?Respiratory failure ?Patient left intubated after surgery ?Ventilator management per CCM ?Tracheostomy placed 5/1 ?On weaning ? ?Fever and leukocytosis ??Chicken pox ?Tmax 101.5->102.1 ->101.5 ?WBC 12.3-14.5-18.5-17.2 -> 13.9 ?Body rash concerning for ?  Varicella on acyclovir ?VZV IgM-negative ?VZV PCR pending ?Tracheal aspirate + MRSA -> On vancomycin ? ?Hospital day # 14 ? ?Patient seen and examined by NP/APP with MD. MD to update note as needed.  ? ?Janine Ores, DNP, FNP-BC ?[facility] ?Pager: [phone] ? ?ATTENDING NOTE: ?I reviewed above note and agree with the assessment and plan. Pt was seen and examined.  ? ?Wife at the bedside.  Patient worked with PT/OT this morning, sitting up with converted to bed.  Was tired after PT/OT  session.  On my examination, patient drowsy sleepy, however still able to follow commands on the right arm and leg although slow.  Sodium 151, continue to have fever, on acyclovir and vancomycin.  Ileus stable, on trickle feeds.  Continue IV fluid.  Yesterday weaned off 3 hours, today after PT/OT, he was tired and put back on ventilation.  Continue current management. ? ?For detailed assessment and plan, please refer to above as I have made changes wherever appropriate.  ? ?Jacob Hawking, MD PhD ?Stroke Neurology ?09/26/2021 ?11:17 PM ? ?This patient is critically ill due to large right MCA stroke, cerebral edema s/p [facility], respiratory failure status post tracheostomy, fever, leukocytosis, MRSA infection and chickenpox and at significant risk of neurological worsening, death form recurrent stroke, hemorrhagic transformation, brain herniation, sepsis, heart failure and renal failure. This patient's care requires constant monitoring of vital signs, hemodynamics, respiratory and cardiac monitoring, review of multiple databases, neurological assessment, discussion with  family, other specialists and medical decision making of high complexity. I spent 35 minutes of neurocritical care time in the care of this patient. I had long discussion with wife at bedside, updated pt current co

## 2021-09-26 NOTE — Progress Notes (Signed)
[facility] ADULT ICU REPLACEMENT PROTOCOL ? ? ?The patient does apply for the [facility] Adult ICU Electrolyte Replacment Protocol based on the criteria listed below:  ? ?1.Exclusion criteria: TCTS patients, ECMO patients, and Dialysis patients ?2. Is GFR >/= 30 ml/min? Yes.    ?Patient's GFR today is >60 ?3. Is SCr </= 2? Yes.   ?Patient's SCr is 1.23 mg/dL ?4. Did SCr increase >/= 0.5 in 24 hours? No. ?5.Pt's weight >40kg  Yes.   ?6. Abnormal electrolyte(s): K+ 3.4  ?7. Electrolytes replaced per protocol ?8.  Call MD STAT for K+ </= 2.5, Phos </= 1, or Mag </= 1 ?Physician:  n/a ? ?Darlys Gales 09/26/2021 6:03 AM ? ?

## 2021-09-27 ENCOUNTER — Inpatient Hospital Stay (HOSPITAL_COMMUNITY): Payer: BC Managed Care – PPO

## 2021-09-27 DIAGNOSIS — I38 Endocarditis, valve unspecified: Secondary | ICD-10-CM

## 2021-09-27 DIAGNOSIS — I63511 Cerebral infarction due to unspecified occlusion or stenosis of right middle cerebral artery: Secondary | ICD-10-CM | POA: Diagnosis not present

## 2021-09-27 LAB — RENAL FUNCTION PANEL
Albumin: 1.8 g/dL — ABNORMAL LOW (ref 3.5–5.0)
Anion gap: 8 (ref 5–15)
BUN: 36 mg/dL — ABNORMAL HIGH (ref 6–20)
CO2: 22 mmol/L (ref 22–32)
Calcium: 8.2 mg/dL — ABNORMAL LOW (ref 8.9–10.3)
Chloride: 122 mmol/L — ABNORMAL HIGH (ref 98–111)
Creatinine, Ser: 1.13 mg/dL (ref 0.61–1.24)
GFR, Estimated: >60 mL/min (ref >60)
Glucose, Bld: 129 mg/dL — ABNORMAL HIGH (ref 70–99)
Phosphorus: 3.4 mg/dL (ref 2.5–4.6)
Potassium: 3.1 mmol/L — ABNORMAL LOW (ref 3.5–5.1)
Sodium: 152 mmol/L — ABNORMAL HIGH (ref 135–145)

## 2021-09-27 LAB — BLOOD CULTURE ID PANEL (REFLEXED) - BCID2

## 2021-09-27 LAB — COMPREHENSIVE METABOLIC PANEL
ALT: 35 U/L (ref 0–44)
AST: 97 U/L — ABNORMAL HIGH (ref 15–41)
Albumin: 1.8 g/dL — ABNORMAL LOW (ref 3.5–5.0)
Alkaline Phosphatase: 80 U/L (ref 38–126)
Anion gap: 8 (ref 5–15)
BUN: 36 mg/dL — ABNORMAL HIGH (ref 6–20)
CO2: 22 mmol/L (ref 22–32)
Calcium: 8.2 mg/dL — ABNORMAL LOW (ref 8.9–10.3)
Chloride: 123 mmol/L — ABNORMAL HIGH (ref 98–111)
Creatinine, Ser: 1.17 mg/dL (ref 0.61–1.24)
GFR, Estimated: >60 mL/min (ref >60)
Glucose, Bld: 130 mg/dL — ABNORMAL HIGH (ref 70–99)
Potassium: 3.2 mmol/L — ABNORMAL LOW (ref 3.5–5.1)
Sodium: 153 mmol/L — ABNORMAL HIGH (ref 135–145)
Total Bilirubin: 1.1 mg/dL (ref 0.3–1.2)
Total Protein: 6.5 g/dL (ref 6.5–8.1)

## 2021-09-27 LAB — GLUCOSE, CAPILLARY
Glucose-Capillary: 101 mg/dL — ABNORMAL HIGH (ref 70–99)
Glucose-Capillary: 128 mg/dL — ABNORMAL HIGH (ref 70–99)
Glucose-Capillary: 133 mg/dL — ABNORMAL HIGH (ref 70–99)
Glucose-Capillary: 137 mg/dL — ABNORMAL HIGH (ref 70–99)
Glucose-Capillary: 162 mg/dL — ABNORMAL HIGH (ref 70–99)
Glucose-Capillary: 171 mg/dL — ABNORMAL HIGH (ref 70–99)

## 2021-09-27 LAB — CBC
HCT: 28.5 % — ABNORMAL LOW (ref 39.0–52.0)
Hemoglobin: 9.3 g/dL — ABNORMAL LOW (ref 13.0–17.0)
MCH: 29.3 pg (ref 26.0–34.0)
MCHC: 32.6 g/dL (ref 30.0–36.0)
MCV: 89.9 fL (ref 80.0–100.0)
Platelets: 226 10*3/uL (ref 150–400)
RBC: 3.17 MIL/uL — ABNORMAL LOW (ref 4.22–5.81)
RDW: 14.4 % (ref 11.5–15.5)
WBC: 10.7 10*3/uL — ABNORMAL HIGH (ref 4.0–10.5)
nRBC: 0 % (ref 0.0–0.2)

## 2021-09-27 LAB — VANCOMYCIN, TROUGH: Vancomycin Tr: 7 ug/mL — ABNORMAL LOW (ref 15–20)

## 2021-09-27 LAB — ECHOCARDIOGRAM LIMITED
Height: 73 in
Weight: 3608.49 oz

## 2021-09-27 MED ORDER — CHLORPROMAZINE HCL 10 MG PO TABS
10.0000 mg | ORAL_TABLET | Freq: Three times a day (TID) | ORAL | Status: DC | PRN
  Administered 2021-09-28: 10 mg
  Filled 2021-09-27 (×3): qty 1

## 2021-09-27 MED ORDER — POTASSIUM CHLORIDE 10 MEQ/100ML IV SOLN
10.0000 meq | INTRAVENOUS | Status: AC
  Administered 2021-09-27 (×6): 10 meq via INTRAVENOUS
  Filled 2021-09-27 (×6): qty 100

## 2021-09-27 NOTE — Progress Notes (Addendum)
STROKE TEAM PROGRESS NOTE  ? ?INTERVAL HISTORY ?Patient is seen in his room with his wife at the bedside.  He reportedly had a good session with PT and OT today.  He was febrile again overnight and today with a Tmax of 38.3 C.  WBC down to 10.7.  MRSA in blood and lungs. On exam he is following commands but appears tired.  Continue weaning trials as tolerated, CCM remains on board for respiratory/pneumonia management. ?Eyes deviated to the right but at times will be midline and even able to look to the left, following commands ? ?Vitals:  ? 09/27/21 0500 09/27/21 0600 09/27/21 0758 09/27/21 0800  ?BP: (!) 169/97 (!) 164/104 (!) 176/95 (!) 176/95  ?Pulse: 89 90 98 99  ?Resp: 17 (!) 24 (!) 34 (!) 22  ?Temp:    98.6 ?F (37 ?C)  ?TempSrc:    Axillary  ?SpO2: 100% 100% 100% 100%  ?Weight: 102.3 kg     ?Height:      ? ?CBC:  ?Recent Labs  ?Lab 09/26/21 ?0405 09/27/21 ?0326  ?WBC 13.9* 10.7*  ?NEUTROABS 11.2*  --   ?HGB 10.1* 9.3*  ?HCT 31.6* 28.5*  ?MCV 90.0 89.9  ?PLT 210 226  ? ? ?Basic Metabolic Panel:  ?Recent Labs  ?Lab 09/20/21 ?1043 09/21/21 ?0206 09/25/21 ?0411 09/26/21 ?0405 09/27/21 ?[id number]  ?NA  --    < > 152* 151* 153*  152*  ?K  --    < > 3.7 3.4* 3.2*  3.1*  ?CL  --    < > 119* 120* 123*  122*  ?CO2  --    < > 22 23 22  22   ?GLUCOSE  --    < > 150* 163* 130*  129*  ?BUN  --    < > 57* 48* 36*  36*  ?CREATININE  --    < > 1.47* 1.23 1.17  1.13  ?CALCIUM  --    < > 8.8* 8.4* 8.2*  8.2*  ?MG 2.6*  --  2.8*  --   --   ?PHOS 3.1  --  4.1 4.0 3.4  ? < > = values in this interval not displayed.  ? ? ?Lipid Panel:  ?No results for input(s): CHOL, TRIG, HDL, CHOLHDL, VLDL, LDLCALC in the last 168 hours. ? ?HgbA1c:  ?No results for input(s): HGBA1C in the last 168 hours. ? ?Urine Drug Screen:  ?No results for input(s): LABOPIA, COCAINSCRNUR, LABBENZ, AMPHETMU, THCU, LABBARB in the last 168 hours. ?  ?Alcohol Level No results for input(s): ETH in the last 168 hours. ? ?IMAGING past 24 hours ?No results  found. ? ?PHYSICAL EXAM ? ?Physical Exam  ?Constitutional: Appears well-developed and well-nourished middle-age is sedated and intubated African-American male, intubated hemi craniectomy surgical incision with clips seen on the right side ?Cardiovascular: Normal rate and regular rhythm.  ?Respiratory: Respirations synchronous with ventilator ? ?Neuro (iTrached): Right gaze deviation, blink to threat. Corneal intact bilateral.   Following commands on the right.  PERRL,  oculocephalic reflex, cough and gag reflexes present.  ? ?ASSESSMENT/PLAN ?Jacob [name] is a 46 y.o. male with history of HTN, DM2 presenting with left side facial droop, left arm weakness. Received TNKase. Neuro status declined during MRI (NIHSS 13),taken for thrombectomy with Dr. Karenann Cai. TICI3 recanalization of the right M2/MCA achieved.  MRI/MRA shows reoccluded right M2 branch with a right MCA territory infarct.  Continue with PT/OT/SLP evals.  Home blood pressure medications resumed. BP goal less  than 160. Topamax and Tylenol #3 for headache management.  Head CT shows new 57mm leftward midline shift. Trasnferred back to ICU, decompressive hemicraniectomy performed by neurosurgery, bone flap placed in abdomen.  Follow-up CT shows 5 mm left midline shift.   HTS d/c'd due to Na of 160.  Tracheostomy placed 5/1.  Now febrile, empiric antibiotic started by CCM.  Tracheal aspirate cultured and blood culture sent.  Blood cultures show no growth, respiratory culture shows MRSA.  Varicella PCR pending ? ?Stroke:  Right MCA due to right MCA occlusion  s/p TNK and IR with TICI3, etiology not certain, but most likely cryptogenic versus intracranial atherosclerosis. ?Code Stroke CT head No acute abnormality ?CTA head & neck R short segment M1 occlusion, left distal M1 high grade stenosis ?Post IR CT No hemorrhagic complication on flat panel CT. ?4/21- Early MRI Tiny acute infarcts in the upper division right MCA cortex. ?4/22- MRI right MCA  territory infarct involving the cortex, sparing the basal ganglia.  2 mm midline shift.  No hemorrhage ?4/22- MRA reoccluded right M2 branch, intracranial atherosclerosis including advanced left M1 stenosis ?4/24-  Head CT-increased edema resulting in near complete effacement of the right lateral ventricle and 12 mm leftward midline shift ?4/25- Head CT Post OP- 5 mm left midline shift, new area of hypoattenuation of the left middle cerebellar peduncle and cerebellum ?4/28 head CT large right MCA infarct with no HT, diminished mass effect with no midline shift, hemicraniectomy noted ?5/3 CT head stable, no midline shift, large right MCA infarct ?2D Echo EF > 75% ?LDL 96 ?HgbA1c 8.1 ?UDS negative ?VTE prophylaxis - SCDs ?No antithrombotic prior to admission, now on ASA 81.  ?Therapy recommendations: PT/OT at [facility] ?disposition:  Pending ? ?Cerebral Edema s/p [facility] ?Head CT 4/24 shows increased edema with new 57mm midline shift ?Decompressive hemicraniectomy performed 4/24 by neurosurgery ?Remove staples on postop day 10 ?Na 155->160->159-> 156->153-> 150-> 152->151 ?Off HTS->FW->LR @ 100 ?No midline shift seen on head CT 4/28 and 5/3 ?On keppra 500mg  bid ?Allow Na gradually trending down ? ?Intracranial stenosis ?CTA head & neck R short segment M1 occlusion, left distal M1 high grade stenosis ?No significant extracranial stenosis ?Most likely related to long standing uncontrolled risk factors. ? ?Hypertension ?Home meds:  Amlodipine 10mg , lisinopril 10mg -resumed ?Stable ?BP goal <160  ?On coreg 25, amlodipine 10, clonidine 0.2 Q8, lisinopril 40  ?PRN labetalol and hydralazine ?Long-term BP goal normotensive ? ?Hyperlipidemia ?Home meds:  Atorvastatin 20mg  ?LDL 96, goal < 70 ?Put on lipitor 40 ?Continue statin at discharge ? ?Diabetes type II Uncontrolled ?Home meds:  Glipizide, metformin ?HgbA1c 8.1, goal < 7.0 ?CBGs ?SSI with resist scale, Levemir 11->40 units twice daily ?DM coordinator consulted ? ?Other Stroke Risk  Factors ?Obesity, Body mass index is 29.76 kg/m?., BMI >/= 30 associated with increased stroke risk, recommend weight loss, diet and exercise as appropriate  ? ?Dysphagia  ?Ileus ?N.p.o. postop, still intubated ?Cortrak in place ?Gastric tube for decompression per CCM ?KUB 5/3 unchanged ?On LR @ 100 and D5 @ 25 ?On trickle feeds now @ 20  ? ?Respiratory failure ?Patient left intubated after surgery ?Ventilator management per CCM ?Tracheostomy placed 5/1 ?On weaning ? ?Fever and leukocytosis ??Chicken pox ?Tmax for 24 hours 38.3 ?WBC 12.3-14.5-18.5-17.2 -> 13.9->10.7 ?Body rash concerning for ?  Varicella on acyclovir ?VZV IgM-negative ?VZV PCR pending ?Tracheal aspirate + MRSA -> On vancomycin ? ?Hospital day # 15 ? ?Patient seen and examined by NP/APP with MD. MD to update  note as needed.  ? ?Janine Ores, DNP, FNP-BC ?[facility] ?Pager: [phone] ? ?ATTENDING ATTESTATION: ? ?Seen by neurosurgery today, awaiting for cerebral swelling to subside before replacing bone flap. Still febrile, MRSA in blood and lungs. On vanc and acyclovir (for chiken pox labs pending). ID consulted, recommend con't vanc, TEE, repeat Blood cx today. ?WBC is 10 today. Na is 153 ? ?Appreciate CCM help. On PS trials. Agree with checking TEE. ? ?Dr. Reeves Forth evaluated pt independently, reviewed imaging, chart, labs. Discussed and formulated plan with the APP. Please see APP note above for details.    ?  ?This patient is critically ill due to respiratory distress, stroke s/p tPA and at significant risk of neurological worsening, death form heart failure, respiratory failure, recurrent stroke, bleeding from Scnetx, seizure, sepsis. This patient's care requires constant monitoring of vital signs, hemodynamics, respiratory and cardiac monitoring, review of multiple databases, neurological assessment, discussion with family, other specialists and medical decision making of high complexity. I spent 35 minutes of neurocritical care time  in the care of this patient.  ? ?Jacob Dinneen,MD  ? ? ?To contact Stroke Continuity provider, please refer to [email or web address] ?After hours, contact General Neurology ? ?

## 2021-09-27 NOTE — Progress Notes (Signed)
PHARMACY - PHYSICIAN COMMUNICATION ?CRITICAL VALUE ALERT - BLOOD CULTURE IDENTIFICATION (BCID) ? ?Jacob [name] is an 46 y.o. male who presented to [facility] on 09/12/2021 with a chief complaint of stroke ? ? ?Name of physician (or Provider) Contacted: Dr. Oletta Darter ? ?Current antibiotics: Vancomycin ? ?Changes to prescribed antibiotics recommended:  ?Patient is on recommended antibiotics - No changes needed ? ?Results for orders placed or performed during the hospital encounter of 09/12/21  ?Blood Culture ID Panel (Reflexed) (Collected: 09/23/2021  9:22 AM)  ?Result Value Ref Range  ? Enterococcus faecalis NOT DETECTED NOT DETECTED  ? Enterococcus Faecium NOT DETECTED NOT DETECTED  ? Listeria monocytogenes NOT DETECTED NOT DETECTED  ? Staphylococcus species DETECTED (A) NOT DETECTED  ? Staphylococcus aureus (BCID) DETECTED (A) NOT DETECTED  ? Staphylococcus epidermidis NOT DETECTED NOT DETECTED  ? Staphylococcus lugdunensis NOT DETECTED NOT DETECTED  ? Streptococcus species NOT DETECTED NOT DETECTED  ? Streptococcus agalactiae NOT DETECTED NOT DETECTED  ? Streptococcus pneumoniae NOT DETECTED NOT DETECTED  ? Streptococcus pyogenes NOT DETECTED NOT DETECTED  ? A.calcoaceticus-baumannii NOT DETECTED NOT DETECTED  ? Bacteroides fragilis NOT DETECTED NOT DETECTED  ? Enterobacterales NOT DETECTED NOT DETECTED  ? Enterobacter cloacae complex NOT DETECTED NOT DETECTED  ? Escherichia coli NOT DETECTED NOT DETECTED  ? Klebsiella aerogenes NOT DETECTED NOT DETECTED  ? Klebsiella oxytoca NOT DETECTED NOT DETECTED  ? Klebsiella pneumoniae NOT DETECTED NOT DETECTED  ? Proteus species NOT DETECTED NOT DETECTED  ? Salmonella species NOT DETECTED NOT DETECTED  ? Serratia marcescens NOT DETECTED NOT DETECTED  ? Haemophilus influenzae NOT DETECTED NOT DETECTED  ? Neisseria meningitidis NOT DETECTED NOT DETECTED  ? Pseudomonas aeruginosa NOT DETECTED NOT DETECTED  ? Stenotrophomonas maltophilia NOT DETECTED NOT DETECTED  ? Candida  albicans NOT DETECTED NOT DETECTED  ? Candida auris NOT DETECTED NOT DETECTED  ? Candida glabrata NOT DETECTED NOT DETECTED  ? Candida krusei NOT DETECTED NOT DETECTED  ? Candida parapsilosis NOT DETECTED NOT DETECTED  ? Candida tropicalis NOT DETECTED NOT DETECTED  ? Cryptococcus neoformans/gattii NOT DETECTED NOT DETECTED  ? Meth resistant mecA/C and MREJ DETECTED (A) NOT DETECTED  ? ? ?[name] ?09/27/2021  2:49 AM ? ?

## 2021-09-27 NOTE — Progress Notes (Signed)
Pharmacy Antibiotic Note ? ?Jacob [name] is a 46 y.o. male admitted on 09/12/2021 with R MCA s/p TNK, IR, and hemicraniectomy with concern for  Varicella zoster  and MRSA infection. Pharmacy has been consulted for IV acyclovir and vancomycin dosing. ? ?Current antimicrobials:  ?Vancomycin 1500mg  q24h (AUC goal 400-550) ?Acyclovir 800mg  q8h ? ?5/6 0900 VT - 7, 5/5 1400 VP - 41 ?Scr 1.13 (improved from 1.68). WBC 10.7 and tmax 101.5 F.  ? ?Plan: ?Continue vancomycin 1500mg  q24h (eAUC 472 per SS levels) ?Continue acyclovir 800mg  q8hr along with LR @100  ml/hr ?Transition acyclovir to po valacyclovir when able ?F/u renal function, length of therapy and levels as indicated ? ?Height: 6\' 1"  (185.4 cm) ?Weight: 102.3 kg (225 lb 8.5 oz) ?IBW/kg (Calculated) : 79.9 ? ?Temp (24hrs), Avg:100.5 ?F (38.1 ?C), Min:98.6 ?F (37 ?C), Max:101.5 ?F (38.6 ?C) ? ?Recent Labs  ?Lab 09/23/21 ?0302 09/24/21 ?[id number] 09/25/21 ?0411 09/26/21 ?0405 09/26/21 ?1332 09/27/21 ?[id number] 09/27/21 ?[id number]  ?WBC 14.5* 18.5* 17.2* 13.9*  --  10.7*  --   ?CREATININE 1.33* 1.68* 1.47* 1.23  --  1.17  1.13  --   ?[place]  --   --   --   --   --   --  7*  ?VANCOPEAK  --   --   --   --  1*  --   --   ? ?  ?Estimated Creatinine Clearance: 100.5 mL/min (by C-G formula based on SCr of 1.13 mg/dL).   ? ?Allergies  ?Allergen Reactions  ? Hydrochlorothiazide Other (See Comments)  ?  Bilateral muscle cramping  ? ? ?Thank you for allowing pharmacy to participate in this patient's care. ? ?Levonne Spiller, PharmD ?PGY1 Acute Care Resident  ?09/27/2021,11:42 AM ? ? ? ?

## 2021-09-27 NOTE — Progress Notes (Signed)
? ?  Providing Compassionate, Quality Care - Together ? ?NEUROSURGERY PROGRESS NOTE ? ? ?S: No issues overnight.  ? ?O: EXAM:  ?BP (!) 176/95 (BP Location: Right Arm)   Pulse 99   Temp 98.6 ?F (37 ?C)   Resp (!) 22   Ht 6\' 1"  (1.854 m)   Wt 102.3 kg   SpO2 100%   BMI 29.76 kg/m?  ? ?Eyes closed ?Pupils equally round, 3 mm, reactive, right lower gaze preference ?Follows commands right upper extremity, spontaneously moves right lower extremity ?No motor response to pain left upper/lower extremity ?Right craniectomy flap soft, healing appropriately ? ?ASSESSMENT:  ?46 y.o. male with  ? ?Right MCA infarct ? ?-Status post right hemicraniectomy ? ?PLAN: ?-Continue supportive care ?-Await further cerebral swelling for replacement of bone flap ?-I updated the family at bedside ? ? ? ?Thank you for allowing me to participate in this patient's care.  Please do not hesitate to call with questions or concerns. ? ? ?Gustie Bobb, DO ?Neurosurgeon ?[facility] ?Cell: [phone] ? ?

## 2021-09-27 NOTE — Progress Notes (Signed)
[facility] ADULT ICU REPLACEMENT PROTOCOL ? ? ?The patient does apply for the [facility] Adult ICU Electrolyte Replacment Protocol based on the criteria listed below:  ? ?1.Exclusion criteria: TCTS patients, ECMO patients, and Dialysis patients ?2. Is GFR >/= 30 ml/min? Yes.    ?Patient's GFR today is >60 ?3. Is SCr </= 2? Yes.   ?Patient's SCr is 1.17 mg/dL ?4. Did SCr increase >/= 0.5 in 24 hours? No. ?5.Pt's weight >40kg  Yes.   ?6. Abnormal electrolyte(s): K+ 3.2  ?7. Electrolytes replaced per protocol ?8.  Call MD STAT for K+ </= 2.5, Phos </= 1, or Mag </= 1 ?Physician:  Dr. Oletta Darter ? ?Winfred Leeds 09/27/2021 6:49 AM  ?

## 2021-09-27 NOTE — Progress Notes (Signed)
?  Echocardiogram ?2D Echocardiogram has been performed. ? ?Jacob [name] ?09/27/2021, 1:57 PM ?

## 2021-09-27 NOTE — Consult Note (Signed)
? ? ?[facility] for Infectious Diseases  ?                                                                                     ? ?Patient Identification: ?Patient Name: Jacob [name] MRN: [id number] [place] Date: 09/12/2021  2:55 AM ?Today's Date: 09/27/2021 ?Reason for consult: MRSA bacteremia ?Requesting provider: Johnnye Lana Consult ? ?Active Problems: ?  Diabetes mellitus without complication ([place]) ?  Essential hypertension ?  Hyperlipidemia ?  Cerebral edema (HCC) ?  Acute respiratory failure with hypoxia ([place]) ?  Urinary retention ?  Fever ?  Hypernatremia ?  Cerebral embolism with cerebral infarction ? ? ?Antibiotics:  ?Vancomycin 5/2- ?Valacyclovir 5/3, Acyclovir IV 5/4- ?Cefepime 5/2 ?Cefazolin 4/24-4/26 ? ?Lines/Hardware: ? ?Assessment ?# Acute hypoxic respiratory failure status post trach 2/2 below and MRSA pneumonia ? ?# MRSA bacteremia ( nosocomial) : Source could be PIV site vs PNA, r/o endocarditis ?4/27 US Findings  consistent  with age indeterminate superficial vein thrombosis involving the left  cephalic  vein.  ?TTE 4/21 with no vegetations or endocarditis ?Less likely to be craniotomy surgical site ( healing well) ? ?# Right MCA CVA status post tPA and mechanical thrombectomy c/b cerebral edema s/p decompressive craniectomy 4/24 ?Neuro surgery following ? 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   ?LP deferred due to known CVA and on empiric IV acyclovir. ?5/3 VZV IgM negative  ? ?AKI ? ?Recommendations  ?Continue vancomycin, pharmacy to dose ?Repeat 2 sets of blood cultures today ?TEE to r/o endocarditis when feasible to determine length of treatment  ?Monitor CBC, BMP and Vancomycin trough ?Fu pending VZV studies  ?Discussed with Primary following ? ?Rest of the management as per the primary team. Please call with questions or concerns.  ?Thank you for the  consult ? ?Rosiland Oz, MD ?Infectious Disease Physician ?[facility] ?[address] ?[place], [place] [address] ?Phone: [phone]  Fax: [phone] ? ?__________________________________________________________________________________________________________ ?HPI and Hospital Course: ?46 year old male with PMH of DM, hypertension who was brought in the ED via EMS on 4/21 code stroke for slurred speech, left facial droop and could not feel full in his left hand.  CT head negative.  CTA showed right M1/MCA occlusion. S/p TNK and cerebral angiogram and mechanical thrombectomy on 4/21 followed by complete recanalization complicated by significant  cerebral edema and s/p right hemicraniectomy on 4/24 for decompression. 4/22 MRA with  reoccluded right M2 branch, intracranial atherosclerosis including advanced left M1 stenosis. S/p per cutaneous  tracheostomy on 5/1 for chronic respiratory failure. ID consulted for MRSA bacteremia ? ?Afebrile on admission but intermittently febrile afterwards with increasing leukocytosis upto  19.5 on 5/2. Blood cx 5/2 MRSA in 1/4 bottles. Also 5/1 trach cultures + for MRSA and being treated with Vancomycin for MRSA PNA. Patient also being started on IV acyclovir for concerns of disseminated VZV infection with concerns of nosocomial transmission from another patient n the same unit. LP deferred due to known CVA,  ? ?ROS unavailable at this time ? ?Past Medical History:  ?Diagnosis Date  ? Allergy   ?  Diabetes mellitus without complication ([place])   ? Hypertension   ? ?Past Surgical History:  ?Procedure Laterality Date  ? APPENDECTOMY    ? CRANIOTOMY Right 09/15/2021  ? Procedure: RIGHT DECOMPRESSIVE CRANIOTOMY, PLACEMENT OF BONE FLAP IN ABDOMEN;  Surgeon: Newman Pies, MD;  Location: [place] [facility];  Service: Neurosurgery;  Laterality: Right;  ? IR CT HEAD LTD  09/12/2021  ? IR PERCUTANEOUS ART THROMBECTOMY/INFUSION INTRACRANIAL INC DIAG  ANGIO  09/12/2021  ? IR US GUIDE VASC ACCESS RIGHT  09/12/2021  ? LAPAROSCOPIC APPENDECTOMY  10/24/2013  ? Procedure: APPENDECTOMY LAPAROSCOPIC;  Surgeon: Shann Medal, MD;  Location: [facility];  Service: General;;  ? RADIOLOGY WITH ANESTHESIA N/A 09/12/2021  ? Procedure: IR WITH ANESTHESIA;  Surgeon: Luanne Bras, MD;  Location: [place];  Service: Radiology;  Laterality: N/A;  ? ? ? ?Scheduled Meds: ?  stroke: early stages of recovery book   Does not apply Once  ? amLODipine  10 mg Per Tube Daily  ? aspirin  81 mg Per Tube Daily  ? atorvastatin  40 mg Per Tube Daily  ? bisacodyl  10 mg Rectal Q1400  ? carvedilol  25 mg Per Tube BID WC  ? chlorhexidine gluconate (MEDLINE KIT)  15 mL Mouth Rinse BID  ? Chlorhexidine Gluconate Cloth  6 each Topical Q0600  ? cloNIDine  0.3 mg Transdermal Weekly  ? enoxaparin (LOVENOX) injection  40 mg Subcutaneous Q24H  ? insulin aspart  0-20 Units Subcutaneous Q4H  ? insulin detemir  32 Units Subcutaneous BID  ? lisinopril  40 mg Per Tube Daily  ? mouth rinse  15 mL Mouth Rinse 10 times per day  ? pantoprazole (PROTONIX) IV  40 mg Intravenous Q24H  ? sodium chloride flush  3 mL Intravenous Once  ? ?Continuous Infusions: ? sodium chloride Stopped (09/25/21 1338)  ? acyclovir (ZOVIRAX) [phone] mg IVPB 166 mL/hr at 09/27/21 0617  ? dextrose 5% lactated ringers 25 mL/hr at 09/27/21 0617  ? feeding supplement (VITAL 1.5 CAL) 1,000 mL (09/26/21 1618)  ? lactated ringers 100 mL/hr at 09/27/21 0716  ? levETIRAcetam Stopped (09/26/21 2312)  ? potassium chloride 10 mEq (09/27/21 0617)  ? vancomycin Stopped (09/26/21 1304)  ? ?PRN Meds:.sodium chloride, acetaminophen **OR** acetaminophen (TYLENOL) oral liquid 160 mg/5 mL **OR** acetaminophen, fentaNYL (SUBLIMAZE) injection, hydrALAZINE, labetalol, [DISCONTINUED] ondansetron **OR** ondansetron (ZOFRAN) IV ? ?Allergies  ?Allergen Reactions  ? Hydrochlorothiazide Other (See Comments)  ?  Bilateral muscle cramping  ? ?Social History  ? ?Socioeconomic  History  ? Marital status: Single  ?  Spouse name: Not on file  ? Number of children: Not on file  ? Years of education: Not on file  ? Highest education level: Not on file  ?Occupational History  ? Not on file  ?Tobacco Use  ? Smoking status: Never  ? Smokeless tobacco: Never  ?Substance and Sexual Activity  ? Alcohol use: Yes  ? Drug use: No  ? Sexual activity: Not on file  ?Other Topics Concern  ? Not on file  ?Social History Narrative  ? Not on file  ? ?Social Determinants of Health  ? ?Financial Resource Strain: Not on file  ?Food Insecurity: Not on file  ?Transportation Needs: Not on file  ?Physical Activity: Not on file  ?Stress: Not on file  ?Social Connections: Not on file  ?Intimate Partner Violence: Not on file  ? ?Family History  ?Problem Relation Age of Onset  ? Asthma Son   ? ?Vitals ? BP (!) 164/104  Pulse 90   Temp (!) 101 ?F (38.3 ?C) (Axillary) Comment: prn tylenol given  Resp (!) 24   Ht 6' 1"  (1.854 m)   Wt 102.3 kg   SpO2 100%   BMI 29.76 kg/m?  ? ?Physical Exam ?Constitutional: Lying in the bed ?   Comments: S/p trach with NG/OG tube, copios trach secretions ? ?Cardiovascular:  ?   Rate and Rhythm: Normal rate and regular rhythm.  ?   Heart sounds:  ? ?Pulmonary:  ?   Effort: Pulmonary effort is normal on trach to vent, FiO2 30% ?   Comments: Coarse breath sounds ? ?Abdominal:  ?   Palpations: Abdomen is soft.  ?   Tenderness: Distended ? ?Musculoskeletal:     ?   General: No swelling or redness in the peripheral joints.  Back is not examined ? ?Skin: ?   Comments: Healing lesions in the bilateral upper extremities and upper chest ( thought to be VZV) ? ?Neurological:  ?   General: Follows some rt sided commands ? ? ?Pertinent Microbiology ?Results for orders placed or performed during the hospital encounter of 09/12/21  ?MRSA Next Gen by PCR, Nasal     Status: None  ? Collection Time: 09/12/21  6:29 AM  ? Specimen: Nasal Mucosa; Nasal Swab  ?Result Value Ref Range Status  ? MRSA by PCR  Next Gen NOT DETECTED NOT DETECTED Final  ?  Comment: (NOTE) ?The GeneXpert MRSA Assay (FDA approved for NASAL specimens only), ?is one component of a comprehensive MRSA colonization surveillance ?program. It i

## 2021-09-27 NOTE — Progress Notes (Signed)
? ?NAME:  Jacob [name], MRN:  [name], DOB:  [birth year], LOS: 60 ?ADMISSION DATE:  09/12/2021, CONSULTATION DATE:  09/15/2021 ?REFERRING MD:  Leonie Man - Stroke, CHIEF COMPLAINT:  Cerebral edema.   ? ?History of Present Illness:  ?46 year old man with worsening cerebral edema. ? ?He presented 4/21 with left-sided weakness which was initially waxing and waning. CTA showed right M1 occlusion.  Further decline post TNK brought for thrombectomy at TICI 3 revascularization of right M2. ?Transfer to floor but experienced neurological decline 4/24.  CT showed malignant cerebral edema with 12 mm right to left midline shift and Trapping of the temporal horn. ? ?He was brought back to the ICU and started on 3% hypertonic saline and prepped for the operating room.  Platelets were transfused to offset effect of clopidogrel and aspirin. ? ?Taken to OR and underwent right frontotemporal parietal hemicraniectomy with placement of bone flap in abdominal subcutaneous tissue. Returned to ICU on vent. ? ?Pertinent  Medical History  ? ?Past Medical History:  ?Diagnosis Date  ? Allergy   ? Diabetes mellitus without complication ([place])   ? Hypertension   ? ?Significant Hospital Events: ?Including procedures, antibiotic start and stop dates in addition to other pertinent events   ?4/24 decompressive craniectomy. ?4/25 decreased midline shift on CT ?4/26 still on HT saline.  Switched from prop to precedex. BP meds titrated up in effort to decrease cleviprex. Cortrak placed.  ?4/27 Cleviprex maxed out, started on labetolol gtt. Started scheduled clonidine, also started coreg. Foley replaced and started on doxazosin due to urinary retention. HT infusion cut in half w/ plan to work towards stopping. Stopped fent gtt. Changed to PRN w/ plan to start precedex if needed.  Checking [place] of UE to eval fever. Also sending PCT ?5/1 trached, c/o pain upper abd, tmax 101.3, diarrhea overnight ?5/2 febrile 102.7, WBC up, c/o ongoing abd pain/distention, KUB  c/w ileus, cultured and started on vanc/ cefepime, NGT placed for decompression. ?5/3 suspected VZV infection. Added acyclovir  ?5/4 failed SBT with apnea  ?5/5 working with PT  ?5/6 Bcx +MRSA ? ?Interim History / Subjective:  ?PSV -- some irregular resp patterns  ? ?Objective   ?Blood pressure (!) 176/95, pulse 99, temperature 98.6 ?F (37 ?C), temperature source Axillary, resp. rate (!) 22, height 6\' 1"  (1.854 m), weight 102.3 kg, SpO2 100 %. ?   ?Vent Mode: PSV;CPAP ?FiO2 (%):  [30 %] 30 % ?Set Rate:  [18 bmp] 18 bmp ?Vt Set:  [id number] mL] 630 mL ?PEEP:  [5 cmH20] 5 cmH20 ?Pressure Support:  [10 cmH20] 10 cmH20 ?Plateau Pressure:  [18 cmH20-20 cmH20] 18 cmH20  ? ?Intake/Output Summary (Last 24 hours) at 09/27/2021 1018 ?Last data filed at 09/27/2021 0800 ?Gross per 24 hour  ?Intake 4038.44 ml  ?Output 1951 ml  ?Net 2087.44 ml  ? ?Filed Weights  ? 09/25/21 0326 09/26/21 0500 09/27/21 0500  ?Weight: 104.2 kg 102.4 kg 102.3 kg  ? ?Examination: ?General:  Ill middle aged M seated in bed NAD  ?HEENT: Trach secure, tan secretions. Large bore NGT and cortrak  ?Neuro:  Following R sided commands  ?CV: rrr cap refill <3  ?PULM: Irregular respiratory pattern on PSV without complication. Bilateral rhonchi. Tan secretions  ?GI: RUQ bone flap. + bowel sounds  ?Extremities: no acute joint deformity. Depended edema  ?Skin: c/d/w  ? ? ?Assessment & Plan:  ? ?R MCA CVA s/p tpa and mechanical thrombectomy c/b cerebral edema (brain compression), s/p decompressive R hemicrani with  abdominal flap (4/24) ?P ?-minimize sedation  ?-stroke per neuro  ?-flap in R abdomen  ?-amantadine ?-keppra  ?- cont ASA/ lipitor  ?- Delirium precautions ? ?Acute hypoxic respiratory failure, s/p tracheostomy ?MRSA PNA  ?Hiccups  ?- s/p trach 5/1 ?P ?-wean vent support as able -- tolerating abnormal resp pattern unless problematic  ?-goal ATC  ?-pulm hygiene ?-cont vanc  ?-thorazine  ? ?Sepsis due to MRSA PNA, MRSA bacteremia, possible VZV infection  ?P ?-cont  acyclovir, vanc ?-ID following  ? ?AKI ?Hypernatremia ?Hypokalemia  ?P ?-allowing Na to slowly normalize ?-correct K, will also give  ?-trend renal indices UOP ? ?HTN ?P ?-lisinopril, clonidine, coreg, norvasc  ? ?Resolving ileus ?Inadequate PO intake  ?P ?-adv EN as tolerated  ? ?DM with hyperglycemia  ?- basal, SSI  ?-dc d5LR 5/6 -- expect this will help to a degree ? ? ?Best Practice (right click and "Reselect all SmartList Selections" daily)  ? ?Diet/type: tubefeeds- advance EN again 5/6 ?DVT prophylaxis: LMWH ?GI prophylaxis: PPI ?Lines: N/A ?Foley:  N/A  ?Code Status:  full code ?Last date of multidisciplinary goals of care discussion [Wife updated at bedside 5/5] ? ?CRITICAL CARE ?Performed by: Cristal Generous ? ? ?Total critical care time: 35 minutes ? ?Critical care time was exclusive of separately billable procedures and treating other patients. ?Critical care was necessary to treat or prevent imminent or life-threatening deterioration. ? ?Critical care was time spent personally by me on the following activities: development of treatment plan with patient and/or surrogate as well as nursing, discussions with consultants, evaluation of patient's response to treatment, examination of patient, obtaining history from patient or surrogate, ordering and performing treatments and interventions, ordering and review of laboratory studies, ordering and review of radiographic studies, pulse oximetry and re-evaluation of patient's condition. ? ?Eliseo Gum MSN, AGACNP-BC ?[facility] ?Amion for pager ?09/27/2021, 10:18 AM ? ?

## 2021-09-28 ENCOUNTER — Inpatient Hospital Stay (HOSPITAL_COMMUNITY): Payer: BC Managed Care – PPO

## 2021-09-28 DIAGNOSIS — I639 Cerebral infarction, unspecified: Secondary | ICD-10-CM | POA: Diagnosis not present

## 2021-09-28 DIAGNOSIS — J15212 Pneumonia due to Methicillin resistant Staphylococcus aureus: Secondary | ICD-10-CM

## 2021-09-28 LAB — GLUCOSE, CAPILLARY
Glucose-Capillary: 144 mg/dL — ABNORMAL HIGH (ref 70–99)
Glucose-Capillary: 156 mg/dL — ABNORMAL HIGH (ref 70–99)
Glucose-Capillary: 141 mg/dL — ABNORMAL HIGH (ref 70–99)
Glucose-Capillary: 124 mg/dL — ABNORMAL HIGH (ref 70–99)
Glucose-Capillary: 94 mg/dL (ref 70–99)

## 2021-09-28 LAB — HEMOGLOBIN AND HEMATOCRIT, BLOOD
HCT: 28 % — ABNORMAL LOW (ref 39.0–52.0)
Hemoglobin: 8.7 g/dL — ABNORMAL LOW (ref 13.0–17.0)

## 2021-09-28 LAB — BASIC METABOLIC PANEL
Anion gap: 4 — ABNORMAL LOW (ref 5–15)
BUN: 27 mg/dL — ABNORMAL HIGH (ref 6–20)
CO2: 22 mmol/L (ref 22–32)
Calcium: 8 mg/dL — ABNORMAL LOW (ref 8.9–10.3)
Chloride: 123 mmol/L — ABNORMAL HIGH (ref 98–111)
Creatinine, Ser: 1.04 mg/dL (ref 0.61–1.24)
GFR, Estimated: >60 mL/min (ref >60)
Glucose, Bld: 143 mg/dL — ABNORMAL HIGH (ref 70–99)
Potassium: 3.7 mmol/L (ref 3.5–5.1)
Sodium: 149 mmol/L — ABNORMAL HIGH (ref 135–145)

## 2021-09-28 LAB — CBC
HCT: 26.5 % — ABNORMAL LOW (ref 39.0–52.0)
Hemoglobin: 8.5 g/dL — ABNORMAL LOW (ref 13.0–17.0)
MCH: 29 pg (ref 26.0–34.0)
MCHC: 32.1 g/dL (ref 30.0–36.0)
MCV: 90.4 fL (ref 80.0–100.0)
Platelets: 217 10*3/uL (ref 150–400)
RBC: 2.93 MIL/uL — ABNORMAL LOW (ref 4.22–5.81)
RDW: 14.5 % (ref 11.5–15.5)
WBC: 10.9 10*3/uL — ABNORMAL HIGH (ref 4.0–10.5)
nRBC: 0 % (ref 0.0–0.2)

## 2021-09-28 LAB — VARICELLA-ZOSTER BY PCR: Varicella-Zoster, PCR: NEGATIVE

## 2021-09-28 LAB — CULTURE, BLOOD (ROUTINE X 2)
Culture: NO GROWTH
Special Requests: ADEQUATE

## 2021-09-28 IMAGING — DX DG ABDOMEN 1V
1 series · 2 of 2 positions shown · non-contrast
Comparison: [DATE]

CLINICAL DATA: Ileus.

EXAM:
ABDOMEN - 1 VIEW

[Series 1: abdomen · 0.14mm/px · 2 of 2 slices shown]
[im 1/2]
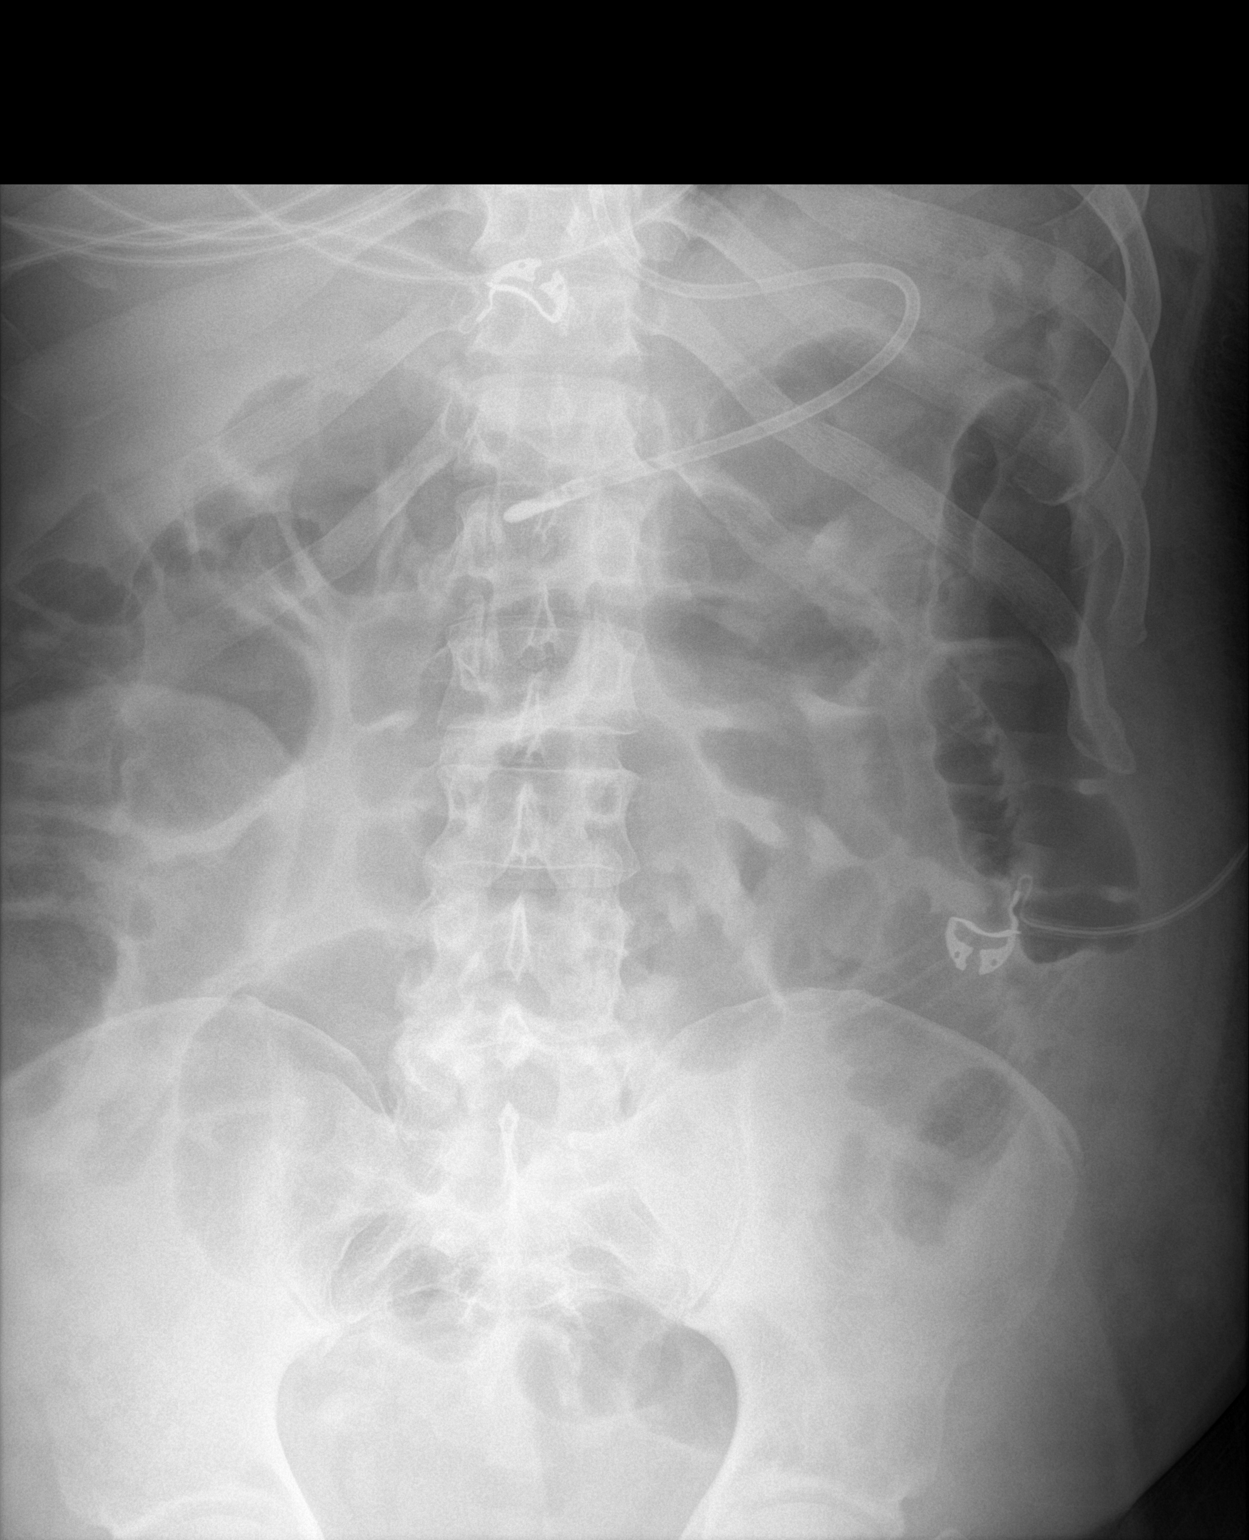
[im 2/2]
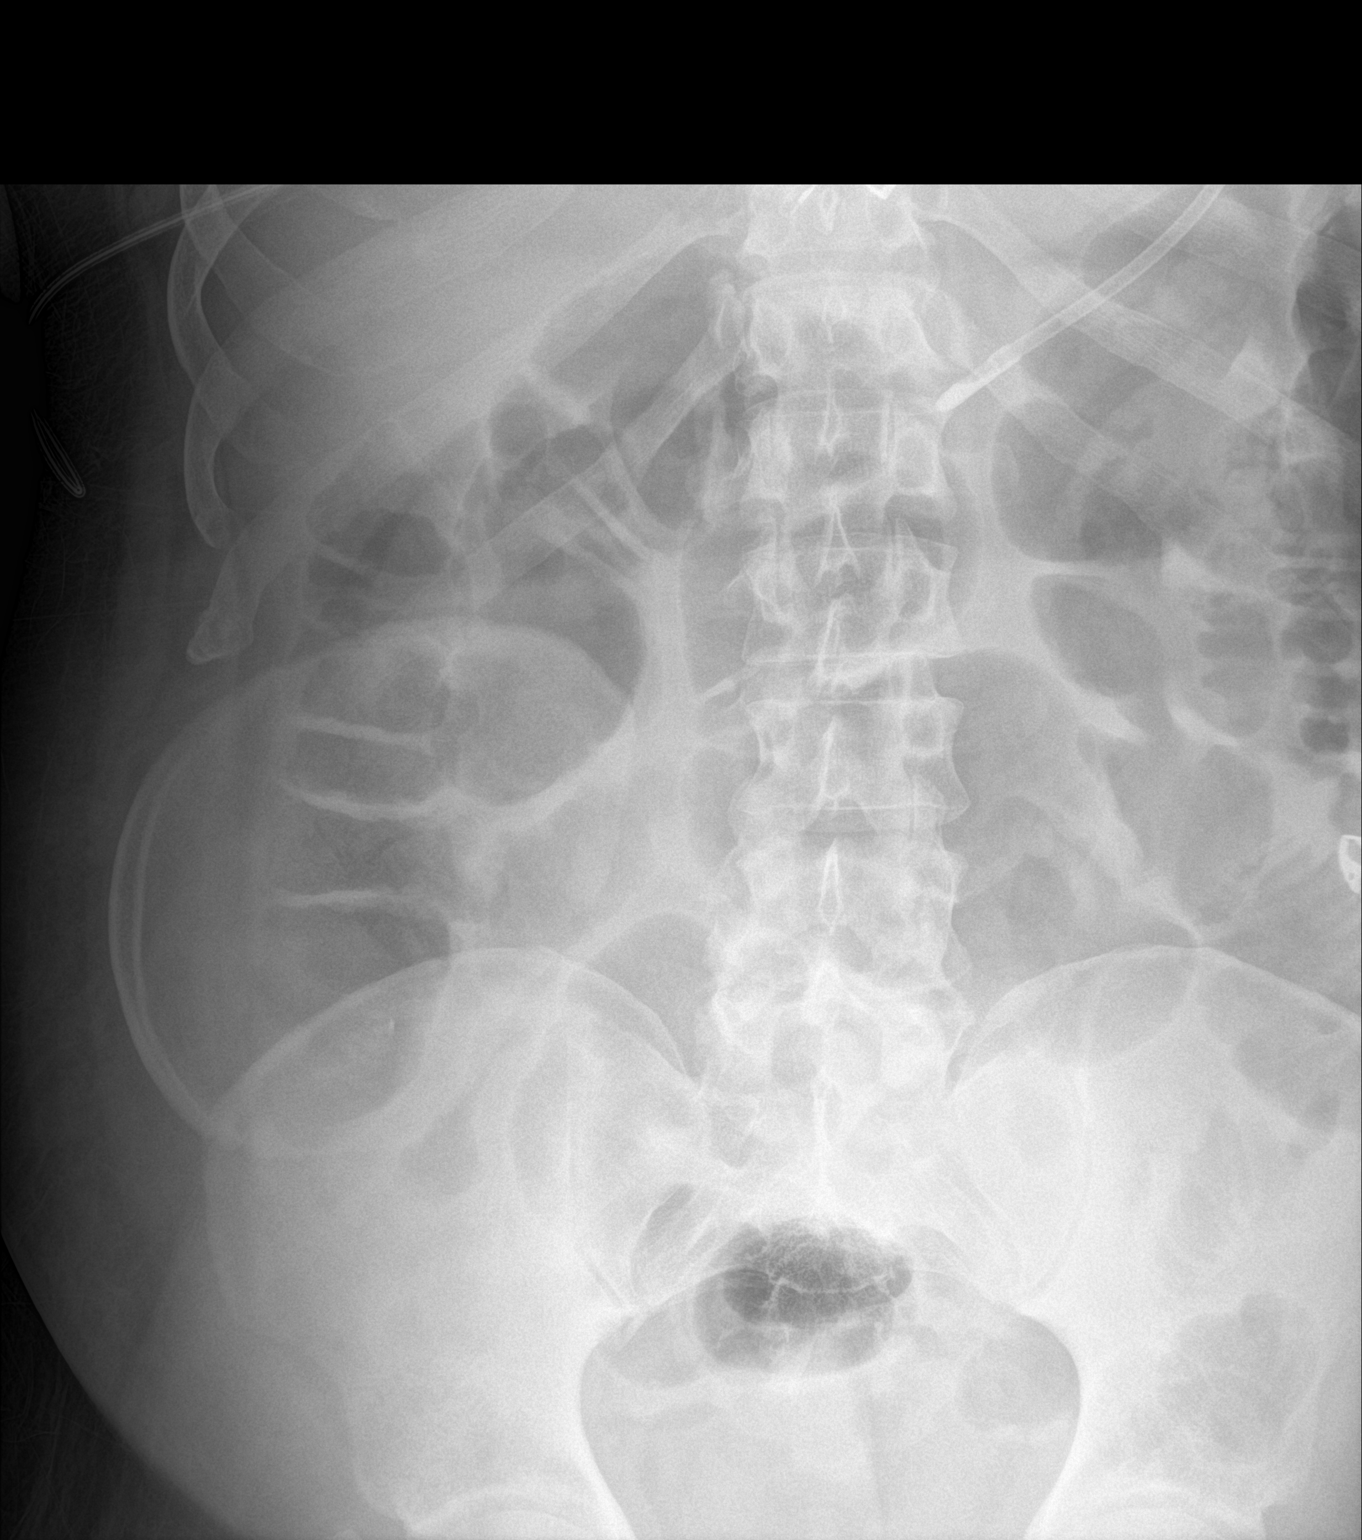

[2 of 2 positions shown; findings below may reference images not displayed]

FINDINGS: Tip of the weighted enteric tube below the diaphragm in the stomach,
tip in the midline directed towards the pylorus. Persisting gaseous
small bowel distention up to 4.8 cm in the left abdomen, although
appears to be diminishing small bowel distension from prior
radiograph. There is air throughout the colon. No significant formed
stool is seen. No evidence of free air. There is a rounded density
projecting over the right abdomen, likely representing calvarial
flap.
IMPRESSION: 1. Persistent gaseous small bowel distention, with some improvement
from prior radiograph. There is air throughout the colon. Findings
typical of ileus.
2. Tip of the weighted enteric tube in the stomach, tip in the
midline directed towards the pylorus.
3. There is a rounded density in the right mid abdomen likely
representing calvarial flap.

## 2021-09-28 IMAGING — DX DG CHEST 1V PORT
1 series · 1 of 1 positions shown · non-contrast
Comparison: Chest x-ray dated [DATE]

CLINICAL DATA: Multifocal pneumonia

EXAM:
PORTABLE CHEST 1 VIEW

[chest ap]
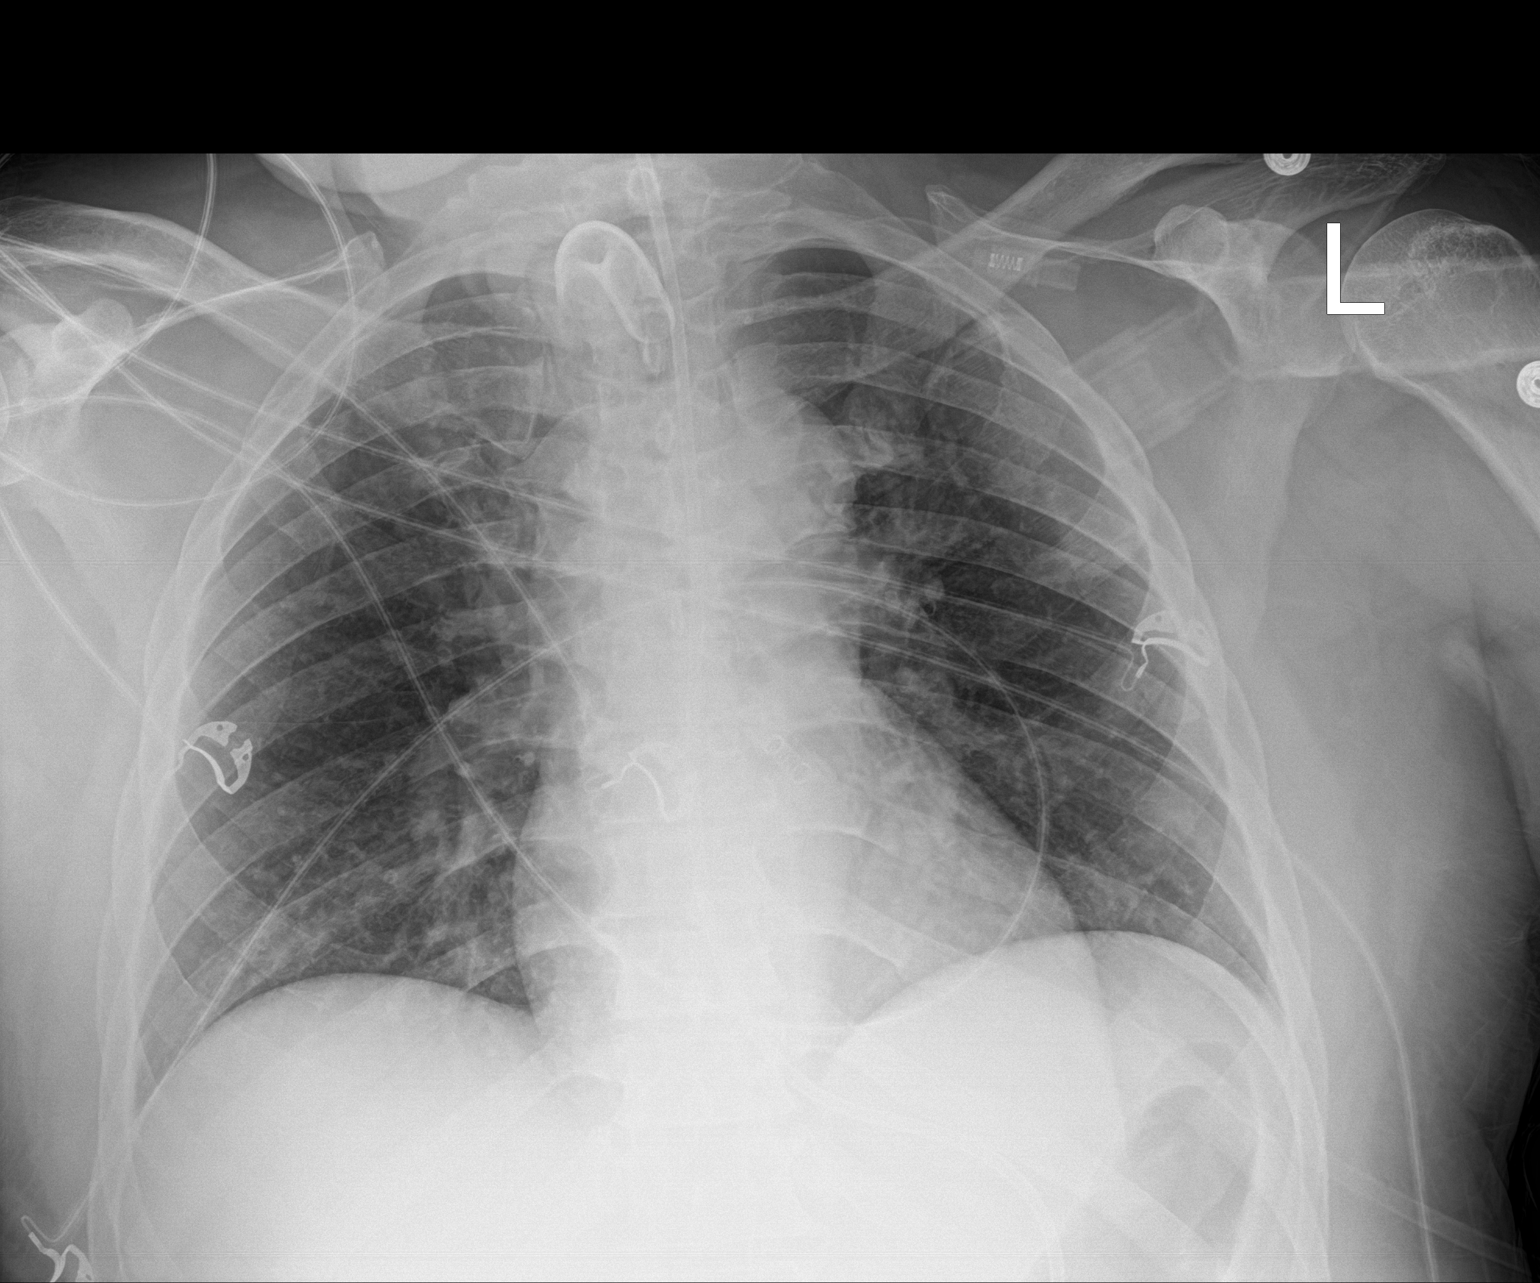

[1 of 1 positions shown; findings below may reference images not displayed]

FINDINGS: Cardiac and mediastinal contours within normal limits. Tracheostomy
tube and feeding tube in place. Interval removal of right arm PICC.
Improved aeration of left lung base. No large pleural effusion
pneumothorax.
IMPRESSION: 1. Improved aeration of the left lung base, likely due to decreased
atelectasis.
2. Interval removal of right arm PICC.

## 2021-09-28 MED ORDER — POLYETHYLENE GLYCOL 3350 17 G PO PACK
17.0000 g | PACK | Freq: Every day | ORAL | Status: DC | PRN
  Administered 2021-09-28: 17 g

## 2021-09-28 MED ORDER — POLYETHYLENE GLYCOL 3350 17 G PO PACK
17.0000 g | PACK | Freq: Every day | ORAL | Status: DC | PRN
  Filled 2021-09-28: qty 1

## 2021-09-28 MED ORDER — ESCITALOPRAM OXALATE 10 MG PO TABS
20.0000 mg | ORAL_TABLET | Freq: Every day | ORAL | Status: DC
  Administered 2021-09-28 – 2021-10-08 (×11): 20 mg
  Filled 2021-09-28 (×11): qty 2

## 2021-09-28 MED ORDER — ALBUTEROL SULFATE (2.5 MG/3ML) 0.083% IN NEBU
2.5000 mg | INHALATION_SOLUTION | Freq: Three times a day (TID) | RESPIRATORY_TRACT | Status: DC
  Administered 2021-09-28 (×3): 2.5 mg via RESPIRATORY_TRACT
  Filled 2021-09-28 (×3): qty 3

## 2021-09-28 MED ORDER — POTASSIUM CHLORIDE 20 MEQ PO PACK
40.0000 meq | PACK | Freq: Once | ORAL | Status: AC
  Administered 2021-09-28: 40 meq
  Filled 2021-09-28: qty 2

## 2021-09-28 MED ORDER — ALBUTEROL SULFATE (2.5 MG/3ML) 0.083% IN NEBU: 2.5000 mg | INHALATION_SOLUTION | Freq: Four times a day (QID) | RESPIRATORY_TRACT | Status: DC | PRN

## 2021-09-28 MED ORDER — TRANEXAMIC ACID FOR INHALATION
500.0000 mg | Freq: Three times a day (TID) | RESPIRATORY_TRACT | Status: AC
  Administered 2021-09-28 – 2021-09-30 (×6): 500 mg via RESPIRATORY_TRACT
  Filled 2021-09-28 (×6): qty 10

## 2021-09-28 MED ORDER — ESCITALOPRAM OXALATE 10 MG PO TABS: 10.0000 mg | ORAL_TABLET | Freq: Every day | ORAL | Status: DC

## 2021-09-28 NOTE — Progress Notes (Signed)
Pt  remains on TC at this time.  RT will plan to keep pt weaning to TC overnight if pt tolerates.  RT will continue to monitor. ?

## 2021-09-28 NOTE — Progress Notes (Signed)
? ?NAME:  Jacob [name], MRN:  [id number], DOB:  Oct 15, 1975, LOS: 2 ?ADMISSION DATE:  09/12/2021, CONSULTATION DATE:  09/15/2021 ?REFERRING MD:  Leonie Man - Stroke, CHIEF COMPLAINT:  Cerebral edema.   ? ?History of Present Illness:  ?46 year old man with worsening cerebral edema. ? ?He presented 4/21 with left-sided weakness which was initially waxing and waning. CTA showed right M1 occlusion.  Further decline post TNK brought for thrombectomy at TICI 3 revascularization of right M2. ?Transfer to floor but experienced neurological decline 4/24.  CT showed malignant cerebral edema with 12 mm right to left midline shift and Trapping of the temporal horn. ? ?He was brought back to the ICU and started on 3% hypertonic saline and prepped for the operating room.  Platelets were transfused to offset effect of clopidogrel and aspirin. ? ?Taken to OR and underwent right frontotemporal parietal hemicraniectomy with placement of bone flap in abdominal subcutaneous tissue. Returned to ICU on vent. ? ?Pertinent  Medical History  ? ?Past Medical History:  ?Diagnosis Date  ? Allergy   ? Diabetes mellitus without complication ([place])   ? Hypertension   ? ?Significant Hospital Events: ?Including procedures, antibiotic start and stop dates in addition to other pertinent events   ?4/24 decompressive craniectomy. ?4/25 decreased midline shift on CT ?4/26 still on HT saline.  Switched from prop to precedex. BP meds titrated up in effort to decrease cleviprex. Cortrak placed.  ?4/27 Cleviprex maxed out, started on labetolol gtt. Started scheduled clonidine, also started coreg. Foley replaced and started on doxazosin due to urinary retention. HT infusion cut in half w/ plan to work towards stopping. Stopped fent gtt. Changed to PRN w/ plan to start precedex if needed.  Checking [place] of UE to eval fever. Also sending PCT ?5/1 trached, c/o pain upper abd, tmax 101.3, diarrhea overnight ?5/2 febrile 102.7, WBC up, c/o ongoing abd pain/distention, KUB  c/w ileus, cultured and started on vanc/ cefepime, NGT placed for decompression. ?5/3 suspected VZV infection. Added acyclovir  ?5/4 failed SBT with apnea  ?5/5 working with PT  ?5/6 Bcx +MRSA ?5/7 tolerating PSV, still with irregular breathing patterns ? ?Interim History / Subjective:  ?No overnight events, slightly more fatigued today per family, still responsive  ? ?Objective   ?Blood pressure (!) 155/103, pulse 98, temperature 99.2 ?F (37.3 ?C), temperature source Oral, resp. rate (!) 24, height 6\' 1"  (1.854 m), weight 102.3 kg, SpO2 100 %. ?   ?Vent Mode: PRVC ?FiO2 (%):  [30 %] 30 % ?Set Rate:  [18 bmp] 18 bmp ?Vt Set:  [id number] mL] 630 mL ?PEEP:  [5 cmH20] 5 cmH20 ?Pressure Support:  [10 cmH20] 10 cmH20 ?Plateau Pressure:  [20 cmH20-25 cmH20] 21 cmH20  ? ?Intake/Output Summary (Last 24 hours) at 09/28/2021 0845 ?Last data filed at 09/28/2021 0600 ?Gross per 24 hour  ?Intake 3494.27 ml  ?Output 1250 ml  ?Net 2244.27 ml  ? ? ?Filed Weights  ? 09/26/21 0500 09/27/21 0500 09/28/21 0500  ?Weight: 102.4 kg 102.3 kg 102.3 kg  ? ?Examination: ?General:  Ill middle aged M seated in bed NAD  ?HEENT: Trach secure, tan secretions. Large bore NGT and cortrak  ?Neuro:  Following R sided commands  ?CV: rrr cap refill <3  ?PULM: Irregular respiratory pattern on PSV without complication. Bilateral rhonchi. Tan secretions  ?GI: RUQ bone flap. + bowel sounds  ?Extremities: no acute joint deformity. Depended edema  ?Skin: c/d/w  ? ? ?Assessment & Plan:  ? ?R MCA CVA s/p  tpa and mechanical thrombectomy c/b cerebral edema (brain compression), s/p decompressive R hemicrani with abdominal flap (4/24) ?P ?-continue to minimize sedation  ?-stroke per neuro  ?-flap in R abdomen  ?-amantadine ?-keppra  ?- cont ASA/ lipitor  ?- Delirium precautions ? ?Acute hypoxic respiratory failure, s/p tracheostomy ?MRSA PNA  ?Hiccups  ?- s/p trach 5/1 ?P ?-wean vent support as able -- tolerating abnormal resp pattern unless problematic, unchanged  today ?-goal ATC  ?-pulm hygiene ?-cont vanc  ?-thorazine  ? ?Sepsis due to MRSA PNA, MRSA bacteremia, possible VZV infection  ?P ?-cont acyclovir, vanc ?-ID following  ? ?AKI ?Hypernatremia ?Hypokalemia  ?Creatinine improving ?P ?-allowing Na to slowly normalize, 149 today ?-replete K as needed ?-trend renal indices UOP ? ?HTN ?P ?-lisinopril, clonidine, coreg, norvasc  ? ?Resolving ileus ?Inadequate PO intake  ?P ?-adv EN as tolerated  ? ?DM with hyperglycemia  ?- basal, SSI  ?-dc d5LR 5/6 -- expect this will help to a degree ? ? ?Best Practice (right click and "Reselect all SmartList Selections" daily)  ? ?Diet/type: tubefeeds- advance EN again 5/6 ?DVT prophylaxis: LMWH ?GI prophylaxis: PPI ?Lines: N/A ?Foley:  N/A  ?Code Status:  full code ?Last date of multidisciplinary goals of care discussion [Wife updated at bedside 5/7] ? ?CRITICAL CARE ?Performed by: Otilio Carpen Marda Breidenbach ? ? ?Total critical care time: 33 minutes ? ?Critical care time was exclusive of separately billable procedures and treating other patients. ?Critical care was necessary to treat or prevent imminent or life-threatening deterioration. ? ?Critical care was time spent personally by me on the following activities: development of treatment plan with patient and/or surrogate as well as nursing, discussions with consultants, evaluation of patient's response to treatment, examination of patient, obtaining history from patient or surrogate, ordering and performing treatments and interventions, ordering and review of laboratory studies, ordering and review of radiographic studies, pulse oximetry and re-evaluation of patient's condition. ? ?Otilio Carpen Anniebell Bedore, PA-C ?[facility] ?See Amion for pager ?If no response to pager , please call [phone] until 7pm ?After 7:00 pm call Elink  [phone] ? ?

## 2021-09-28 NOTE — Progress Notes (Signed)
[facility] ADULT ICU REPLACEMENT PROTOCOL ? ? ?The patient does apply for the [facility] Adult ICU Electrolyte Replacment Protocol based on the criteria listed below:  ? ?1.Exclusion criteria: TCTS patients, ECMO patients, and Dialysis patients ?2. Is GFR >/= 30 ml/min? Yes.    ?Patient's GFR today is >60 ?3. Is SCr </= 2? Yes.   ?Patient's SCr is 1.04 mg/dL ?4. Did SCr increase >/= 0.5 in 24 hours? No. ?5.Pt's weight >40kg  Yes.   ?6. Abnormal electrolyte(s): K  ?7. Electrolytes replaced per protocol ?8.  Call MD STAT for K+ </= 2.5, Phos </= 1, or Mag </= 1 ?Physician:  Oletta Darter ? ?Jocob Dambach E Machell Wirthlin 09/28/2021 4:15 AM  ?

## 2021-09-28 NOTE — Progress Notes (Addendum)
STROKE TEAM PROGRESS NOTE  ? ?INTERVAL HISTORY ?Patient is seen in his room with his wife at the bedside.  He reportedly had a good session with PT and OT today.  He was febrile again overnight and today with a Tmax of 38.3 C.  WBC down to 10.7.  MRSA in blood and lungs. On exam he is following commands but appears tired.  Continue weaning trials as tolerated, CCM remains on board for respiratory/pneumonia management. Eyes deviated to the right but at times will be midline and even able to look to the left, following commands.  Possible TEE tomorrow, plan to be n.p.o. at midnight in preparation. ? ?Vitals:  ? 09/28/21 1000 09/28/21 1137 09/28/21 1200 09/28/21 1300  ?BP: 138/90 (!) 135/91 (!) 137/92 (!) 136/91  ?Pulse: 86 88 89 85  ?Resp: (!) 28 (!) 31 (!) 23 17  ?Temp:      ?TempSrc:      ?SpO2: 100% 100% 100% 100%  ?Weight:      ?Height:      ? ?CBC:  ?Recent Labs  ?Lab 09/26/21 ?0405 09/27/21 ?0326 09/28/21 ?0233  ?WBC 13.9* 10.7* 10.9*  ?NEUTROABS 11.2*  --   --   ?HGB 10.1* 9.3* 8.5*  ?HCT 31.6* 28.5* 26.5*  ?MCV 90.0 89.9 90.4  ?PLT 210 226 217  ? ? ?Basic Metabolic Panel:  ?Recent Labs  ?Lab 09/25/21 ?0411 09/26/21 ?0405 09/27/21 ?[id number] 09/28/21 ?[id number]  ?NA 152* 151* 153*  152* 149*  ?K 3.7 3.4* 3.2*  3.1* 3.7  ?CL 119* 120* 123*  122* 123*  ?CO2 22 23 22  22 22   ?GLUCOSE 150* 163* 130*  129* 143*  ?BUN 57* 48* 36*  36* 27*  ?CREATININE 1.47* 1.23 1.17  1.13 1.04  ?CALCIUM 8.8* 8.4* 8.2*  8.2* 8.0*  ?MG 2.8*  --   --   --   ?PHOS 4.1 4.0 3.4  --   ? ? ?Lipid Panel:  ?No results for input(s): CHOL, TRIG, HDL, CHOLHDL, VLDL, LDLCALC in the last 168 hours. ? ?HgbA1c:  ?No results for input(s): HGBA1C in the last 168 hours. ? ?Urine Drug Screen:  ?No results for input(s): LABOPIA, COCAINSCRNUR, LABBENZ, AMPHETMU, THCU, LABBARB in the last 168 hours. ?  ?Alcohol Level No results for input(s): ETH in the last 168 hours. ? ?IMAGING past 24 hours ?DG CHEST PORT 1 VIEW ? ?Result Date: 09/28/2021 ?CLINICAL DATA:   Multifocal pneumonia EXAM: PORTABLE CHEST 1 VIEW COMPARISON:  Chest x-ray dated Sep 22, 2021 FINDINGS: Cardiac and mediastinal contours within normal limits. Tracheostomy tube and feeding tube in place. Interval removal of right arm PICC. Improved aeration of left lung base. No large pleural effusion pneumothorax. IMPRESSION: 1. Improved aeration of the left lung base, likely due to decreased atelectasis. 2. Interval removal of right arm PICC. Electronically Signed   By: Yetta Glassman M.D.   On: 09/28/2021 09:06  ? ?ECHOCARDIOGRAM LIMITED ? ?Result Date: 09/27/2021 ?   ECHOCARDIOGRAM LIMITED REPORT   Patient Name:   Jacob [name] Date of Exam: 09/27/2021 Medical Rec #:  [id number]      Height:       73.0 in Accession #:    [id number]     Weight:       225.5 lb Date of Birth:  1975-07-26      BSA:          2.264 m? Patient Age:    30 years       BP:  142/94 mmHg Patient Gender: M              HR:           87 bpm. Exam Location:  Inpatient Procedure: Limited Echo, Color Doppler and Cardiac Doppler Indications:    Endocarditis i38  History:        Patient has prior history of Echocardiogram examinations, most                 recent 09/12/2021. Risk Factors:Hypertension, Diabetes and                 Dyslipidemia.  Sonographer:    Merrie Roof RDCS Referring Phys: [id number] [place]  1. Limited study.  2. Left ventricular ejection fraction, by estimation, is 70 to 75%. The left ventricle has hyperdynamic function. The left ventricle has no regional wall motion abnormalities.  3. Right ventricular systolic function is normal. The right ventricular size is normal.  4. The mitral valve is grossly normal. Trivial mitral valve regurgitation.  5. Aortic valve regurgitation is not visualized.  6. No obvious valvular vegetations. Comparison(s): No significant change from prior study. Prior images reviewed side by side. FINDINGS  Left Ventricle: Left ventricular ejection fraction, by estimation, is 70 to 75%.  The left ventricle has hyperdynamic function. The left ventricle has no regional wall motion abnormalities. Right Ventricle: The right ventricular size is normal. No increase in right ventricular wall thickness. Right ventricular systolic function is normal. Mitral Valve: The mitral valve is grossly normal. Trivial mitral valve regurgitation. Tricuspid Valve: The tricuspid valve is grossly normal. Tricuspid valve regurgitation is trivial. Aortic Valve: Aortic valve regurgitation is not visualized. Pulmonic Valve: The pulmonic valve was grossly normal. Pulmonic valve regurgitation is trivial. IAS/Shunts: No atrial level shunt detected by color flow Doppler. Rozann Lesches MD Electronically signed by Rozann Lesches MD Signature Date/Time: 09/27/2021/3:24:55 PM    Final    ? ?PHYSICAL EXAM ? ?Physical Exam  ?Constitutional: Appears well-developed and well-nourished middle-age is sedated and intubated African-American male, intubated hemi craniectomy surgical incision with clips seen on the right side ?Cardiovascular: Normal rate and regular rhythm.  ?Respiratory: Respirations synchronous with ventilator ? ?Neuro (iTrached): Right gaze deviation, blink to threat. Corneal intact bilateral.   Following commands on the right.  PERRL,  oculocephalic reflex, cough and gag reflexes present.  ? ?ASSESSMENT/PLAN ?Mr. STEIN BURGOS is a 46 y.o. male with history of HTN, DM2 presenting with left side facial droop, left arm weakness. Received TNKase. Neuro status declined during MRI (NIHSS 13),taken for thrombectomy with Dr. Karenann Cai. TICI3 recanalization of the right M2/MCA achieved.  MRI/MRA shows reoccluded right M2 branch with a right MCA territory infarct.  Continue with PT/OT/SLP evals.  Home blood pressure medications resumed. BP goal less than 160. Topamax and Tylenol #3 for headache management.  Head CT shows new 11mm leftward midline shift. Trasnferred back to ICU, decompressive hemicraniectomy performed by  neurosurgery, bone flap placed in abdomen.  Follow-up CT shows 5 mm left midline shift.   HTS d/c'd due to Na of 160.  Tracheostomy placed 5/1.  Now febrile, empiric antibiotic started by CCM.  Tracheal aspirate cultured and blood culture sent.  Blood cultures show no growth as of 5/6, respiratory culture shows MRSA.  Varicella PCR pending ? ?Stroke:  Right MCA due to right MCA occlusion  s/p TNK and IR with TICI3, etiology not certain, but most likely cryptogenic versus intracranial atherosclerosis. ?Code Stroke CT head No acute  abnormality ?CTA head & neck R short segment M1 occlusion, left distal M1 high grade stenosis ?Post IR CT No hemorrhagic complication on flat panel CT. ?4/21- Early MRI Tiny acute infarcts in the upper division right MCA cortex. ?4/22- MRI right MCA territory infarct involving the cortex, sparing the basal ganglia.  2 mm midline shift.  No hemorrhage ?4/22- MRA reoccluded right M2 branch, intracranial atherosclerosis including advanced left M1 stenosis ?4/24-  Head CT-increased edema resulting in near complete effacement of the right lateral ventricle and 12 mm leftward midline shift ?4/25- Head CT Post OP- 5 mm left midline shift, new area of hypoattenuation of the left middle cerebellar peduncle and cerebellum ?4/28 head CT large right MCA infarct with no HT, diminished mass effect with no midline shift, hemicraniectomy noted ?5/3 CT head stable, no midline shift, large right MCA infarct ?2D Echo EF > 75% ?LDL 96 ?HgbA1c 8.1 ?UDS negative ?VTE prophylaxis - SCDs ?No antithrombotic prior to admission, now on ASA 81.  ?Therapy recommendations: [facility] ?disposition:  Pending ? ?Cerebral Edema s/p [facility] ?Head CT 4/24 shows increased edema with new 85mm midline shift ?Decompressive hemicraniectomy performed 4/24 by neurosurgery ?Remove staples on postop day 10 ?Neurosurgery plans to replace bone flap when swelling subsides, bone flap in abdomen ?Na 155->160->159-> 156->153-> 150->  152->151 ?Off HTS->FW->LR @ 100 ?No midline shift seen on head CT 4/28 and 5/3 ?On keppra 500mg  bid ?Allow Na gradually trending down ? ?Intracranial stenosis ?CTA head & neck R short segment M1 occlusion, left distal

## 2021-09-28 NOTE — Progress Notes (Signed)
? ?  Providing Compassionate, Quality Care - Together ? ?NEUROSURGERY PROGRESS NOTE ? ? ?S: No issues overnight. ? ?O: EXAM:  ?BP (!) 155/103   Pulse 98   Temp 99.2 ?F (37.3 ?C) (Oral)   Resp (!) 24   Ht 6\' 1"  (1.854 m)   Wt 102.3 kg   SpO2 100%   BMI 29.76 kg/m?  ? ?Eyes closed ?Pupils equally round, 3 mm, reactive, right lower gaze preference ?Follows commands right upper extremity, spontaneously moves right lower extremity ?No motor response to pain left upper/lower extremity ?Right craniectomy flap soft, healing appropriately ?  ?ASSESSMENT:  ?46 y.o. male with  ?  ?Right MCA infarct ?  ?-Status post right hemicraniectomy ?  ?PLAN: ?-Continue supportive care ?-Await further cerebral swelling for replacement of bone flap ?-I updated the family at bedside ?  ? ? ? ?Thank you for allowing me to participate in this patient's care.  Please do not hesitate to call with questions or concerns. ? ? ?Arielys Wandersee, DO ?Neurosurgeon ?[facility] ?Cell: [phone] ? ?

## 2021-09-29 DIAGNOSIS — J9601 Acute respiratory failure with hypoxia: Secondary | ICD-10-CM | POA: Diagnosis not present

## 2021-09-29 DIAGNOSIS — I639 Cerebral infarction, unspecified: Secondary | ICD-10-CM | POA: Diagnosis not present

## 2021-09-29 DIAGNOSIS — I63511 Cerebral infarction due to unspecified occlusion or stenosis of right middle cerebral artery: Secondary | ICD-10-CM | POA: Diagnosis not present

## 2021-09-29 DIAGNOSIS — G936 Cerebral edema: Secondary | ICD-10-CM | POA: Diagnosis not present

## 2021-09-29 LAB — CULTURE, BLOOD (ROUTINE X 2): Special Requests: ADEQUATE

## 2021-09-29 LAB — GLUCOSE, CAPILLARY
Glucose-Capillary: 103 mg/dL — ABNORMAL HIGH (ref 70–99)
Glucose-Capillary: 116 mg/dL — ABNORMAL HIGH (ref 70–99)
Glucose-Capillary: 133 mg/dL — ABNORMAL HIGH (ref 70–99)
Glucose-Capillary: 136 mg/dL — ABNORMAL HIGH (ref 70–99)
Glucose-Capillary: 164 mg/dL — ABNORMAL HIGH (ref 70–99)
Glucose-Capillary: 168 mg/dL — ABNORMAL HIGH (ref 70–99)
Glucose-Capillary: 96 mg/dL (ref 70–99)

## 2021-09-29 LAB — CBC
HCT: 28.1 % — ABNORMAL LOW (ref 39.0–52.0)
Hemoglobin: 9 g/dL — ABNORMAL LOW (ref 13.0–17.0)
MCH: 28.9 pg (ref 26.0–34.0)
MCHC: 32 g/dL (ref 30.0–36.0)
MCV: 90.4 fL (ref 80.0–100.0)
Platelets: 242 10*3/uL (ref 150–400)
RBC: 3.11 MIL/uL — ABNORMAL LOW (ref 4.22–5.81)
RDW: 14.3 % (ref 11.5–15.5)
WBC: 12.9 10*3/uL — ABNORMAL HIGH (ref 4.0–10.5)
nRBC: 0.2 % (ref 0.0–0.2)

## 2021-09-29 LAB — BASIC METABOLIC PANEL
Anion gap: 6 (ref 5–15)
BUN: 20 mg/dL (ref 6–20)
CO2: 22 mmol/L (ref 22–32)
Calcium: 8.3 mg/dL — ABNORMAL LOW (ref 8.9–10.3)
Chloride: 117 mmol/L — ABNORMAL HIGH (ref 98–111)
Creatinine, Ser: 0.8 mg/dL (ref 0.61–1.24)
GFR, Estimated: >60 mL/min (ref >60)
Glucose, Bld: 108 mg/dL — ABNORMAL HIGH (ref 70–99)
Potassium: 3.4 mmol/L — ABNORMAL LOW (ref 3.5–5.1)
Sodium: 145 mmol/L (ref 135–145)

## 2021-09-29 MED ORDER — POTASSIUM CHLORIDE 20 MEQ PO PACK
40.0000 meq | PACK | Freq: Once | ORAL | Status: AC
  Administered 2021-09-29: 40 meq
  Filled 2021-09-29: qty 2

## 2021-09-29 MED ORDER — INSULIN DETEMIR 100 UNIT/ML ~~LOC~~ SOLN
30.0000 [IU] | Freq: Two times a day (BID) | SUBCUTANEOUS | Status: DC
  Administered 2021-09-29 – 2021-10-04 (×12): 30 [IU] via SUBCUTANEOUS
  Filled 2021-09-29 (×15): qty 0.3

## 2021-09-29 MED ORDER — VITAL 1.5 CAL PO LIQD
1000.0000 mL | ORAL | Status: DC
  Administered 2021-09-29 – 2021-10-06 (×7): 1000 mL
  Filled 2021-09-29 (×11): qty 1000

## 2021-09-29 MED ORDER — VANCOMYCIN HCL 1500 MG/300ML IV SOLN
1500.0000 mg | Freq: Two times a day (BID) | INTRAVENOUS | Status: DC
  Administered 2021-09-29 – 2021-10-03 (×9): 1500 mg via INTRAVENOUS
  Filled 2021-09-29 (×11): qty 300

## 2021-09-29 NOTE — Progress Notes (Signed)
Subjective: ?The patient is somnolent but easily arousable.  He is in no apparent distress.  His wife is at the bedside. ? ?Objective: ?Vital signs in last 24 hours: ?Temp:  [98.5 ?F (36.9 ?C)-100.2 ?F (37.9 ?C)] 99.8 ?F (37.7 ?C) (05/08 1148) ?Pulse Rate:  [82-94] 85 (05/08 1200) ?Resp:  [13-36] 32 (05/08 1200) ?BP: (130-164)/(82-105) 149/100 (05/08 1200) ?SpO2:  [98 %-100 %] 100 % (05/08 1200) ?FiO2 (%):  [28 %-60 %] 28 % (05/08 1200) ?Weight:  [102.1 kg] 102.1 kg (05/08 0500) ?Estimated body mass index is 29.7 kg/m? as calculated from the following: ?  Height as of this encounter: 6\' 1"  (1.854 m). ?  Weight as of this encounter: 102.1 kg. ? ? ?Intake/Output from previous day: ?05/07 0701 - 05/08 0700 ?In: 3183.7 [I.V.:1741.8; NG/GT:360; IV Piggyback:1082] ?Out: 2350 [Urine:2350] ?Intake/Output this shift: ?Total I/O ?In: 575.4 [I.V.:131.3; IV Piggyback:444.1] ?Out: -  ? ?Physical exam the patient is somnolent but arousable.  He follows commands on the right.  His scalp flap is still tense.  His wounds are healing well. ? ?Lab Results: ?Recent Labs  ?  09/28/21 ?0233 09/28/21 ?1724 09/29/21 ?0323  ?WBC 10.9*  --  12.9*  ?HGB 8.5* 8.7* 9.0*  ?HCT 26.5* 28.0* 28.1*  ?PLT 217  --  242  ? ?BMET ?Recent Labs  ?  09/28/21 ?0233 09/29/21 ?0323  ?NA 149* 145  ?K 3.7 3.4*  ?CL 123* 117*  ?CO2 22 22  ?GLUCOSE 143* 108*  ?BUN 27* 20  ?CREATININE 1.04 0.80  ?CALCIUM 8.0* 8.3*  ? ? ?Studies/Results: ?DG Abd 1 View ? ?Result Date: 09/28/2021 ?CLINICAL DATA:  Ileus. EXAM: ABDOMEN - 1 VIEW COMPARISON:  09/24/2021 FINDINGS: Tip of the weighted enteric tube below the diaphragm in the stomach, tip in the midline directed towards the pylorus. Persisting gaseous small bowel distention up to 4.8 cm in the left abdomen, although appears to be diminishing small bowel distension from prior radiograph. There is air throughout the colon. No significant formed stool is seen. No evidence of free air. There is a rounded density projecting over  the right abdomen, likely representing calvarial flap. IMPRESSION: 1. Persistent gaseous small bowel distention, with some improvement from prior radiograph. There is air throughout the colon. Findings typical of ileus. 2. Tip of the weighted enteric tube in the stomach, tip in the midline directed towards the pylorus. 3. There is a rounded density in the right mid abdomen likely representing calvarial flap. Electronically Signed   By: Keith Rake M.D.   On: 09/28/2021 17:32  ? ?DG CHEST PORT 1 VIEW ? ?Result Date: 09/28/2021 ?CLINICAL DATA:  Multifocal pneumonia EXAM: PORTABLE CHEST 1 VIEW COMPARISON:  Chest x-ray dated Sep 22, 2021 FINDINGS: Cardiac and mediastinal contours within normal limits. Tracheostomy tube and feeding tube in place. Interval removal of right arm PICC. Improved aeration of left lung base. No large pleural effusion pneumothorax. IMPRESSION: 1. Improved aeration of the left lung base, likely due to decreased atelectasis. 2. Interval removal of right arm PICC. Electronically Signed   By: Yetta Glassman M.D.   On: 09/28/2021 09:06  ? ?ECHOCARDIOGRAM LIMITED ? ?Result Date: 09/27/2021 ?   ECHOCARDIOGRAM LIMITED REPORT   Patient Name:   Jacob [name] Date of Exam: 09/27/2021 Medical Rec #:  [id number]      Height:       73.0 in Accession #:    [id number]     Weight:       225.5 lb Date of  Birth:  1975-10-06      BSA:          2.264 m? Patient Age:    46 years       BP:           142/94 mmHg Patient Gender: M              HR:           87 bpm. Exam Location:  Inpatient Procedure: Limited Echo, Color Doppler and Cardiac Doppler Indications:    Endocarditis i38  History:        Patient has prior history of Echocardiogram examinations, most                 recent 09/12/2021. Risk Factors:Hypertension, Diabetes and                 Dyslipidemia.  Sonographer:    Merrie Roof RDCS Referring Phys: [id number] [place]  1. Limited study.  2. Left ventricular ejection fraction, by estimation, is  70 to 75%. The left ventricle has hyperdynamic function. The left ventricle has no regional wall motion abnormalities.  3. Right ventricular systolic function is normal. The right ventricular size is normal.  4. The mitral valve is grossly normal. Trivial mitral valve regurgitation.  5. Aortic valve regurgitation is not visualized.  6. No obvious valvular vegetations. Comparison(s): No significant change from prior study. Prior images reviewed side by side. FINDINGS  Left Ventricle: Left ventricular ejection fraction, by estimation, is 70 to 75%. The left ventricle has hyperdynamic function. The left ventricle has no regional wall motion abnormalities. Right Ventricle: The right ventricular size is normal. No increase in right ventricular wall thickness. Right ventricular systolic function is normal. Mitral Valve: The mitral valve is grossly normal. Trivial mitral valve regurgitation. Tricuspid Valve: The tricuspid valve is grossly normal. Tricuspid valve regurgitation is trivial. Aortic Valve: Aortic valve regurgitation is not visualized. Pulmonic Valve: The pulmonic valve was grossly normal. Pulmonic valve regurgitation is trivial. IAS/Shunts: No atrial level shunt detected by color flow Doppler. Rozann Lesches MD Electronically signed by Rozann Lesches MD Signature Date/Time: 09/27/2021/3:24:55 PM    Final    ? ?Assessment/Plan: ?Status post decompressive craniectomy: The patient is slowly progressing.  Perhaps we can replace his craniotomy flap in a few weeks. ? LOS: 17 days  ? ? ? ?[name] ?09/29/2021, 12:13 PM ? ? ? ? ?Patient ID: Jacob [name], male   DOB: 06-05-1975, 46 y.o.   MRN: [id number] ? ?

## 2021-09-29 NOTE — Progress Notes (Addendum)
STROKE TEAM PROGRESS NOTE  ? ?INTERVAL HISTORY ?Patient is seen in his room with his wife at the bedside.  He has been hemodynamically stable overnight but still febrile.  His neurological exam is stable, and his flap is full and mildly tense.  He is currently being weaned on trach collar and seems to be tolerating it well.  He is sleepy but arousable and follows commands well the midline and on the right body.  Continues to have right gaze deviation and dense left hemiplegia.  Vital signs are stable ? ?Vitals:  ? 09/29/21 1145 09/29/21 1148 09/29/21 1200 09/29/21 1300  ?BP:   (!) 149/100 (!) 156/94  ?Pulse: 84  85 88  ?Resp: (!) 24  (!) 32 (!) 27  ?Temp:  99.8 ?F (37.7 ?C)    ?TempSrc:  Axillary    ?SpO2: 100%  100% 100%  ?Weight:      ?Height:      ? ?CBC:  ?Recent Labs  ?Lab 09/26/21 ?0405 09/27/21 ?0326 09/28/21 ?[id number] 09/28/21 ?1724 09/29/21 ?[id number]  ?WBC 13.9*   < > 10.9*  --  12.9*  ?NEUTROABS 11.2*  --   --   --   --   ?HGB 10.1*   < > 8.5* 8.7* 9.0*  ?HCT 31.6*   < > 26.5* 28.0* 28.1*  ?MCV 90.0   < > 90.4  --  90.4  ?PLT 210   < > 217  --  242  ? < > = values in this interval not displayed.  ? ? ?Basic Metabolic Panel:  ?Recent Labs  ?Lab 09/25/21 ?0411 09/26/21 ?0405 09/27/21 ?[id number] 09/28/21 ?[id number] 09/29/21 ?[id number]  ?NA 152* 151* 153*  152* 149* 145  ?K 3.7 3.4* 3.2*  3.1* 3.7 3.4*  ?CL 119* 120* 123*  122* 123* 117*  ?CO2 22 23 22  22 22 22   ?GLUCOSE 150* 163* 130*  129* 143* 108*  ?BUN 57* 48* 36*  36* 27* 20  ?CREATININE 1.47* 1.23 1.17  1.13 1.04 0.80  ?CALCIUM 8.8* 8.4* 8.2*  8.2* 8.0* 8.3*  ?MG 2.8*  --   --   --   --   ?PHOS 4.1 4.0 3.4  --   --   ? ? ?Lipid Panel:  ?No results for input(s): CHOL, TRIG, HDL, CHOLHDL, VLDL, LDLCALC in the last 168 hours. ? ?HgbA1c:  ?No results for input(s): HGBA1C in the last 168 hours. ? ?Urine Drug Screen:  ?No results for input(s): LABOPIA, COCAINSCRNUR, LABBENZ, AMPHETMU, THCU, LABBARB in the last 168 hours. ?  ?Alcohol Level No results for input(s): ETH in  the last 168 hours. ? ?IMAGING past 24 hours ?DG Abd 1 View ? ?Result Date: 09/28/2021 ?CLINICAL DATA:  Ileus. EXAM: ABDOMEN - 1 VIEW COMPARISON:  09/24/2021 FINDINGS: Tip of the weighted enteric tube below the diaphragm in the stomach, tip in the midline directed towards the pylorus. Persisting gaseous small bowel distention up to 4.8 cm in the left abdomen, although appears to be diminishing small bowel distension from prior radiograph. There is air throughout the colon. No significant formed stool is seen. No evidence of free air. There is a rounded density projecting over the right abdomen, likely representing calvarial flap. IMPRESSION: 1. Persistent gaseous small bowel distention, with some improvement from prior radiograph. There is air throughout the colon. Findings typical of ileus. 2. Tip of the weighted enteric tube in the stomach, tip in the midline directed towards the pylorus. 3. There is a rounded density in the right  mid abdomen likely representing calvarial flap. Electronically Signed   By: Keith Rake M.D.   On: 09/28/2021 17:32   ? ?PHYSICAL EXAM ? ?Physical Exam  ?Constitutional: Appears well-developed and well-nourished middle-age is sedated and intubated African-American male, intubated hemi craniectomy surgical incision  ?Cardiovascular: Normal rate and regular rhythm.  ?Respiratory: Respirations synchronous with ventilator ? ?Neuro (Trached): Right gaze deviation, blink to threat. Following commands on the right and midline.  PERRL, cough and gag reflexes present.  Dense left hemiplegia.  Antigravity strength on the right ? ?ASSESSMENT/PLAN ?[name] is a 46 y.o. male with history of HTN, DM2 presenting with left side facial droop, left arm weakness. Received TNKase. Neuro status declined during MRI (NIHSS 13),taken for thrombectomy with Dr. Karenann Cai. TICI3 recanalization of the right M2/MCA achieved.  MRI/MRA shows reoccluded right M2 branch with a right MCA territory  infarct.  Continue with PT/OT/SLP evals.  Home blood pressure medications resumed. BP goal less than 160. Topamax and Tylenol #3 for headache management.  Head CT shows new 63mm leftward midline shift. Trasnferred back to ICU, decompressive hemicraniectomy performed by neurosurgery, bone flap placed in abdomen.  Follow-up CT shows 5 mm left midline shift.   HTS d/c'd due to Na of 160.  Tracheostomy placed 5/1.  Now febrile, empiric antibiotic started by CCM.  Tracheal aspirate cultured and blood culture sent.  Blood cultures show no growth as of 5/6, respiratory culture shows MRSA.  Varicella PCR negative ? ?Stroke:  Right MCA due to right MCA occlusion  s/p TNK and IR with TICI3, etiology not certain, but most likely cryptogenic versus intracranial atherosclerosis. ?Code Stroke CT head No acute abnormality ?CTA head & neck R short segment M1 occlusion, left distal M1 high grade stenosis ?Post IR CT No hemorrhagic complication on flat panel CT. ?4/21- Early MRI Tiny acute infarcts in the upper division right MCA cortex. ?4/22- MRI right MCA territory infarct involving the cortex, sparing the basal ganglia.  2 mm midline shift.  No hemorrhage ?4/22- MRA reoccluded right M2 branch, intracranial atherosclerosis including advanced left M1 stenosis ?4/24-  Head CT-increased edema resulting in near complete effacement of the right lateral ventricle and 12 mm leftward midline shift ?4/25- Head CT Post OP- 5 mm left midline shift, new area of hypoattenuation of the left middle cerebellar peduncle and cerebellum ?4/28 head CT large right MCA infarct with no HT, diminished mass effect with no midline shift, hemicraniectomy noted ?5/3 CT head stable, no midline shift, large right MCA infarct ?2D Echo EF > 75% ?LDL 96 ?HgbA1c 8.1 ?UDS negative ?VTE prophylaxis - SCDs ?No antithrombotic prior to admission, now on ASA 81.  ?Therapy recommendations: PT/OT at [facility] ?disposition:  Pending ? ?Cerebral Edema s/p [facility] ?Head CT 4/24 shows  increased edema with new 7mm midline shift ?Decompressive hemicraniectomy performed 4/24 by neurosurgery ?Remove staples on postop day 10 ?Neurosurgery plans to replace bone flap when swelling subsides, bone flap in abdomen ?Na 155->160->159-> 156->153-> 150-> 152->151-> 145 ?Off HTS->FW->LR @ 100 ?No midline shift seen on head CT 4/28 and 5/3 ?On keppra 500mg  bid ?Allow Na gradually trending down ? ?Intracranial stenosis ?CTA head & neck R short segment M1 occlusion, left distal M1 high grade stenosis ?No significant extracranial stenosis ?Most likely related to long standing uncontrolled risk factors. ? ?Hypertension ?Home meds:  Amlodipine 10mg , lisinopril 10mg -resumed ?Stable ?BP goal <160  ?On coreg 25, amlodipine 10, clonidine 0.2 Q8, lisinopril 40  ?PRN labetalol and hydralazine ?Long-term BP  goal normotensive ? ?Hyperlipidemia ?Home meds:  Atorvastatin 20mg  ?LDL 96, goal < 70 ?Put on lipitor 40 ?Continue statin at discharge ? ?Diabetes type II Uncontrolled ?Home meds:  Glipizide, metformin ?HgbA1c 8.1, goal < 7.0 ?CBGs ?SSI with resist scale, Levemir 11->40 units twice daily ?DM coordinator consulted ? ?Other Stroke Risk Factors ?Obesity, Body mass index is 29.7 kg/m?., BMI >/= 30 associated with increased stroke risk, recommend weight loss, diet and exercise as appropriate  ? ?Dysphagia  ?Ileus ?N.p.o. postop, still intubated ?Cortrak in place ?Gastric tube for decompression per CCM ?KUB 5/3 unchanged, will repeat KUB today ?On LR @ 75and D5 @ 25 ?On trickle feeds now @ 20  ? ?Respiratory failure ?Patient left intubated after surgery ?Ventilator management per CCM ?Tracheostomy placed 5/1 ?On trach collar today ? ?Fever and leukocytosis ??Chicken pox ?Initial blood culture showed staph, now no growth as of 5/6 ?Tmax for 24 hours 38.3 ?WBC 12.3-14.5-18.5-17.2 -> 13.9->10.7 ?Body rash concerning for ?  Varicella on acyclovir ?VZV IgM-negative ?VZV PCR negative ?Tracheal aspirate + MRSA -> On  vancomycin ? ?Post stroke depression: start lexapro. ? ?Hospital day # 17 ? ?Patient seen and examined by NP/APP with MD. MD to update note as needed.  ? ?[place] , MSN, AGACNP-BC ?[facility] ?See

## 2021-09-29 NOTE — Progress Notes (Signed)
Nutrition Follow-up ? ?DOCUMENTATION CODES:  ? ?Not applicable ? ?INTERVENTION:  ? ?If pt does not tolerate slow advancement of TF recommend supplemental TPN until ileus resolved and pt tolerating TF based on ASPEN/SCCM guidelines.   ? ?Tube feeding via cortrak tube: ?Vital 1.5 at 20 ml/h (1440 ml per day) ? ? ?Increase by 10 ml every 12 hours to goal rate of 60 ml/hr  ? ?Recommend Prosource TF 45 ml BID ? ?Provides 2240 kcal, 119 gm protein, 1094 ml free water daily ? ? ?NUTRITION DIAGNOSIS:  ? ?Inadequate oral intake related to inability to eat as evidenced by NPO status. ?Ongoing.  ? ?GOAL:  ? ?Patient will meet greater than or equal to 90% of their needs ?Not met, TF at trickle  ? ?MONITOR:  ? ?TF tolerance ? ?REASON FOR ASSESSMENT:  ? ?Consult ?Enteral/tube feeding initiation and management ? ?ASSESSMENT:  ? ?Pt with PMH of HTN and DM admitted with R M1 occlusion s/p TNK and thrombectomy. ? ?Pt discussed during ICU rounds and with RN.  ?Pt tolerating TC.  ?Suspected VZV infection, PCR pending ?Pt on trickle TF all weekend. Spoke with CCM. Ok to increase TF slowly. 5/7 xray does not show improvement.  ? ? ?4/24 s/p R frontotemporal parietal hemicraniectomy, bone flap in abd ?4/25 TF initiated  ?4/26 s/p cortrak placement; tip gastric  ?5/1 s/p trach placement; developed ileus and TF held ?5/5 restart TF at trickle ? ? ? ?Medications reviewed and include: dulcolax, SSI, novolog, levemir, protonix ? ? ?Labs reviewed: K 3.4 ?A1C: 8.1 (4/21) ?CBG's: 96-136 ? ? ?Diet Order:   ?Diet Order   ? ?       ?  Diet NPO time specified  Diet effective midnight       ?  ? ?  ?  ? ?  ? ? ?EDUCATION NEEDS:  ? ?Not appropriate for education at this time ? ?Skin:  Skin Assessment: Reviewed RN Assessment (head incision, abd incision for bone flap) ? ?Last BM:  5/7 x 2 type 7 ? ?Height:  ? ?Ht Readings from Last 1 Encounters:  ?09/12/21 6' 1"  (1.854 m)  ? ? ?Weight:  ? ?Wt Readings from Last 1 Encounters:  ?09/29/21 102.1 kg   ? ? ?BMI:  Body mass index is 29.7 kg/m?. ? ?Estimated Nutritional Needs:  ? ?Kcal:  2100-2500 ? ?Protein:  110-125 grams ? ?Fluid:  >2 L/day ? ?Lockie Pares., RD, LDN, CNSC ?See AMiON for contact information  ? ?

## 2021-09-29 NOTE — Progress Notes (Signed)
Per Dr. Leonie Man, contacted Endo Lab to determine if patient is on the schedule for TEE today. Patient is not scheduled. ?

## 2021-09-29 NOTE — Progress Notes (Signed)
Pt remains off vent at this time.  RT will cont to monitor. ?

## 2021-09-29 NOTE — Progress Notes (Signed)
? ?[facility] Infectious Diseases Follow Up Note ? ?Patient Identification: ?Patient Name: Jacob [name] MRN: [id number] [place] Date: 09/12/2021  2:55 AM ?Age: 46 y.o.Today's Date: 09/29/2021 ? ?Reason for Visit: MRSA bacteremia/PNA ? ?Principal Problem: ?  Acute ischemic stroke ([place]) ?Active Problems: ?  Diabetes mellitus without complication ([place]) ?  Essential hypertension ?  Hyperlipidemia ?  Cerebral edema ([facility]) ?  Acute respiratory failure with hypoxia ([place]) ?  Urinary retention ?  Fever ?  Hypernatremia ?  Cerebral embolism with cerebral infarction ?  Pneumonia of both lower lobes due to methicillin resistant Staphylococcus aureus (MRSA) ([place]) ? ?Antibiotics:  ?Vancomycin 5/2- ?Valacyclovir 5/3, Acyclovir IV 5/4-5/7 ?Cefepime 5/2 ?Cefazolin 4/24-4/26 ?  ?Lines/Hardware:  ? ?Interval Events: afebrile for more than 24 hrs. TEE  seems to be planned today  ? ?Assessment ?#Acute hypoxic respiratory failure status post tracheostomy with MRSA pneumonia ? ?#MRSA bacteremia ( nosocomial): Source could be PIV site versus pneumonia.  Needs to rule out endocarditis ?4/27 ultrasound age-indeterminate superficial vein thrombosis involving the left cephalic vein ?4/21 TTE no vegetations or endocarditis ?Craniotomy surgical site healing well ? ?#Right MCA CVA status post tPA and mechanical thrombectomy C/B cerebral edema status post decompressive craniectomy 4/24 ?- Neurosurgery following  ? ?# Rashes initially thought to be VZV. However r/o with no supportive lab evidence, appears stable ?- IV acyclovir dc'ed  ?- 09/24/21 VZV PCR and IgM negative  ? ? ?Recommendations ?Continue vancomycin/pharmacy to dose ?Follow-up repeat blood cultures ?We will follow-up TEE ?Monitor CBC BMP and vancomycin trough ?Can DC airborne precautions ?Following ? ?Rest of the management as per the primary team. ?Thank you for the consult. Please page with pertinent questions or  concerns. ? ?______________________________________________________________________ ?Subjective ?patient seen and examined at the bedside.  ?He is able to follow commands in the rt side ?Spoke with family member at the bedside.  ? ?Vitals ?BP (!) 156/94   Pulse 88   Temp 99.8 ?F (37.7 ?C) (Axillary)   Resp (!) 27   Ht 6\' 1"  (1.854 m)   Wt 102.1 kg   SpO2 100%   BMI 29.70 kg/m?  ? ?  ?Physical Exam ?Constitutional:  lying in the bed and on trach collar ?   Comments:  ? ?Cardiovascular:  ?   Rate and Rhythm: Normal rate and regular rhythm.  ?   Heart sounds:  ? ?Pulmonary:  ?   Effort: Pulmonary effort is normal on trach collar ?   Comments:  ? ?Abdominal:  ?   Palpations: Abdomen is soft.  ?   Tenderness: scalp flap in the abdomen ? ?Musculoskeletal:     ?   General: No swelling or tenderness.  ? ?Skin: ?   Comments: rashes in the extremities appear to be stable ? ?Neurological:  ?   General: left sided plegia. Follows rt sided commands  ? ?Pertinent Microbiology ?Results for orders placed or performed during the hospital encounter of 09/12/21  ?MRSA Next Gen by PCR, Nasal     Status: None  ? Collection Time: 09/12/21  6:29 AM  ? Specimen: Nasal Mucosa; Nasal Swab  ?Result Value Ref Range Status  ? MRSA by PCR Next Gen NOT DETECTED NOT DETECTED Final  ?  Comment: (NOTE) ?The GeneXpert MRSA Assay (FDA approved for NASAL specimens only), ?is one component of a comprehensive MRSA colonization surveillance ?program. It is not intended to diagnose MRSA infection nor to guide ?or monitor treatment for MRSA infections. ?Test performance is not FDA approved in patients  less than 2 years ?old. ?Performed at [facility], [place] [address], [place], [place] ?[address] ?  ?Culture, Respiratory w Gram Stain     Status: None  ? Collection Time: 09/22/21 10:08 AM  ? Specimen: Tracheal Aspirate; Respiratory  ?Result Value Ref Range Status  ? Specimen Description TRACHEAL ASPIRATE  Final  ? Special Requests NONE  Final  ?  Gram Stain   Final  ?  RARE WBC PRESENT,BOTH PMN AND MONONUCLEAR ?MODERATE GRAM POSITIVE COCCI IN CLUSTERS ?Performed at [facility], [place] [address], [place], [place] [address] ?  ? Culture   Final  ?  ABUNDANT METHICILLIN RESISTANT STAPHYLOCOCCUS AUREUS  ? Report Status 09/24/2021 FINAL  Final  ? Organism ID, Bacteria METHICILLIN RESISTANT STAPHYLOCOCCUS AUREUS  Final  ?    Susceptibility  ? Methicillin resistant staphylococcus aureus - MIC*  ?  CIPROFLOXACIN >=8 RESISTANT Resistant   ?  ERYTHROMYCIN >=8 RESISTANT Resistant   ?  GENTAMICIN <=0.5 SENSITIVE Sensitive   ?  OXACILLIN >=4 RESISTANT Resistant   ?  TETRACYCLINE <=1 SENSITIVE Sensitive   ?  VANCOMYCIN 1 SENSITIVE Sensitive   ?  TRIMETH/SULFA <=10 SENSITIVE Sensitive   ?  CLINDAMYCIN <=0.25 SENSITIVE Sensitive   ?  RIFAMPIN <=0.5 SENSITIVE Sensitive   ?  Inducible Clindamycin NEGATIVE Sensitive   ?  * ABUNDANT METHICILLIN RESISTANT STAPHYLOCOCCUS AUREUS  ?Culture, blood (Routine X 2) w Reflex to ID Panel     Status: None  ? Collection Time: 09/23/21  9:15 AM  ? Specimen: BLOOD RIGHT HAND  ?Result Value Ref Range Status  ? Specimen Description BLOOD RIGHT HAND  Final  ? Special Requests   Final  ?  BOTTLES DRAWN AEROBIC AND ANAEROBIC Blood Culture adequate volume  ? Culture   Final  ?  NO GROWTH 5 DAYS ?Performed at [facility], [place] [address] [place], [place] [address] ?  ? Report Status 09/28/2021 FINAL  Final  ?Culture, blood (Routine X 2) w Reflex to ID Panel     Status: Abnormal  ? Collection Time: 09/23/21  9:22 AM  ? Specimen: BLOOD LEFT HAND  ?Result Value Ref Range Status  ? Specimen Description BLOOD LEFT HAND  Final  ? Special Requests   Final  ?  BOTTLES DRAWN AEROBIC AND ANAEROBIC Blood Culture adequate volume  ? Culture  Setup Time   Final  ?  GRAM POSITIVE COCCI ?ANAEROBIC BOTTLE ONLY ?CRITICAL RESULT CALLED TO, READ BACK BY AND VERIFIED WITH: J LEDFORD,[email or web address]@[id number]  09/27/21 [place] ?Performed at [facility], [place] [address] [place], [place] [address] ?  ? Culture METHICILLIN RESISTANT STAPHYLOCOCCUS AUREUS (A)  Final  ? Report Status 09/29/2021 FINAL  Final  ? Organism ID, Bacteria METHICILLIN RESISTANT STAPHYLOCOCCUS AUREUS  Final  ?    Susceptibility  ? Methicillin resistant staphylococcus aureus - MIC*  ?  CIPROFLOXACIN >=8 RESISTANT Resistant   ?  ERYTHROMYCIN >=8 RESISTANT Resistant   ?  GENTAMICIN <=0.5 SENSITIVE Sensitive   ?  OXACILLIN >=4 RESISTANT Resistant   ?  TETRACYCLINE <=1 SENSITIVE Sensitive   ?  VANCOMYCIN <=0.5 SENSITIVE Sensitive   ?  TRIMETH/SULFA <=10 SENSITIVE Sensitive   ?  CLINDAMYCIN <=0.25 SENSITIVE Sensitive   ?  RIFAMPIN <=0.5 SENSITIVE Sensitive   ?  Inducible Clindamycin NEGATIVE Sensitive   ?  * METHICILLIN RESISTANT STAPHYLOCOCCUS AUREUS  ?Blood Culture ID Panel (Reflexed)     Status: Abnormal  ? Collection Time: 09/23/21  9:22  AM  ?Result Value Ref Range Status  ? Enterococcus faecalis NOT DETECTED NOT DETECTED Final  ? Enterococcus Faecium NOT DETECTED NOT DETECTED Final  ? Listeria monocytogenes NOT DETECTED NOT DETECTED Final  ? Staphylococcus species DETECTED (A) NOT DETECTED Final  ?  Comment: CRITICAL RESULT CALLED TO, READ BACK BY AND VERIFIED WITH: ?J LEDFORD,[id number]  09/27/21 [place] ?  ? Staphylococcus aureus (BCID) DETECTED (A) NOT DETECTED Final  ?  Comment: Methicillin (oxacillin)-resistant Staphylococcus aureus (MRSA). MRSA is predictably resistant to beta-lactam antibiotics (except ceftaroline). Preferred therapy is vancomycin unless clinically contraindicated. Patient requires contact precautions if  ?hospitalized. ?CRITICAL RESULT CALLED TO, READ BACK BY AND VERIFIED WITH: ?J LEDFORD,[id number]  09/27/21 [place] ?  ? Staphylococcus epidermidis NOT DETECTED NOT DETECTED Final  ? Staphylococcus lugdunensis NOT DETECTED NOT DETECTED Final  ? Streptococcus species NOT DETECTED NOT DETECTED Final  ? Streptococcus agalactiae NOT DETECTED NOT DETECTED Final  ? Streptococcus pneumoniae NOT DETECTED  NOT DETECTED Final  ? Streptococcus pyogenes NOT DETECTED NOT DETECTED Final  ? A.calcoaceticus-baumannii NOT DETECTED NOT DETECTED Final  ? Bacteroides fragilis NOT DETECTED NOT DETECTED Final  ? Enterobacterales NOT DETECTED NOT DET

## 2021-09-29 NOTE — Progress Notes (Signed)
? ?NAME:  Jacob [name], MRN:  [name], DOB:  [birth year], LOS: 63 ?ADMISSION DATE:  09/12/2021, CONSULTATION DATE:  09/15/2021 ?REFERRING MD:  Leonie Man - Stroke, CHIEF COMPLAINT:  Cerebral edema.   ? ?History of Present Illness:  ?46 year old man with worsening cerebral edema. ? ?He presented 4/21 with left-sided weakness which was initially waxing and waning. CTA showed right M1 occlusion.  Further decline post TNK brought for thrombectomy at [facility] 3 revascularization of right M2. ?Transfer to floor but experienced neurological decline 4/24.  CT showed malignant cerebral edema with 12 mm right to left midline shift and Trapping of the temporal horn. ? ?He was brought back to the ICU and started on 3% hypertonic saline and prepped for the operating room.  Platelets were transfused to offset effect of clopidogrel and aspirin. ? ?Taken to OR and underwent right frontotemporal parietal hemicraniectomy with placement of bone flap in abdominal subcutaneous tissue. Returned to ICU on vent. ? ?Pertinent  Medical History  ? ?Past Medical History:  ?Diagnosis Date  ? Allergy   ? Diabetes mellitus without complication ([place])   ? Hypertension   ? ?Significant Hospital Events: ?Including procedures, antibiotic start and stop dates in addition to other pertinent events   ?4/24 decompressive craniectomy. ?4/25 decreased midline shift on CT ?4/26 still on HT saline.  Switched from prop to precedex. BP meds titrated up in effort to decrease cleviprex. Cortrak placed.  ?4/27 Cleviprex maxed out, started on labetolol gtt. Started scheduled clonidine, also started coreg. Foley replaced and started on doxazosin due to urinary retention. HT infusion cut in half w/ plan to work towards stopping. Stopped fent gtt. Changed to PRN w/ plan to start precedex if needed.  Checking [place] of UE to eval fever. Also sending PCT ?5/1 trached, c/o pain upper abd, tmax 101.3, diarrhea overnight ?5/2 febrile 102.7, WBC up, c/o ongoing abd pain/distention, KUB  c/w ileus, cultured and started on vanc/ cefepime, NGT placed for decompression. ?5/3 suspected VZV infection. Added acyclovir  ?5/4 failed SBT with apnea  ?5/5 working with PT  ?5/6 Bcx +MRSA ?5/7 tolerating PSV, still with irregular breathing patterns ? ?Interim History / Subjective:  ?Patient tolerating trach collar ?VZV PCR was negative, acyclovir stopped ? ?Objective   ?Blood pressure (!) 150/98, pulse 84, temperature 99.8 ?F (37.7 ?C), temperature source Axillary, resp. rate (!) 29, height 6\' 1"  (1.854 m), weight 102.1 kg, SpO2 100 %. ?   ?FiO2 (%):  [28 %-60 %] 28 %  ? ?Intake/Output Summary (Last 24 hours) at 09/29/2021 1139 ?Last data filed at 09/29/2021 1100 ?Gross per 24 hour  ?Intake 3240.98 ml  ?Output 2050 ml  ?Net 1190.98 ml  ? ?Filed Weights  ? 09/27/21 0500 09/28/21 0500 09/29/21 0500  ?Weight: 102.3 kg 102.3 kg 102.1 kg  ? ?Examination: ?Physical exam: ?General: Acute on chronically ill-appearing male, lying on the bed ?HEENT: [place]/AT, eyes anicteric.  moist mucus membranes ?Neuro: Alert, awake following commands.  Left side is plegic, right gaze deviation ?Chest: Coarse breath sounds, no wheezes or rhonchi ?Heart: Regular rate and rhythm, no murmurs or gallops ?Abdomen: Soft, nontender, nondistended, bowel sounds present ?Skin: No rash ? ? ?Assessment & Plan:  ?Acute R MCA stroke s/p tpa and mechanical thrombectomy c/b cerebral edema (brain compression), s/p decompressive R hemicrani with abdominal flap (4/24) ?Continue neuro watch ?Stroke team is following ?Continue aspirin and atorvastatin ?Continue secondary stroke prophylaxis ?Started on amantadine ?Continue delirium precautions ? ?Acute hypoxic respiratory failure, s/p tracheostomy ?MRSA PNA  ?  Persistent hiccups ?Patient is on trach collar, continue as long as he can tolerate ?Continue IV vancomycin ?Continue Thorazine ?Continue chest PT ? ?Sepsis due to MRSA PNA, MRSA bacteremia ?VZV was ruled out ?Repeat cultures have been negative ?ID  recommended doing TEE ?VZV PCR was negative ?Acyclovir was stopped ?ID is following, appreciate recommendations ?On IV vancomycin ? ?AKI, resolved ?Induced hypernatremia ?Hypokalemia  ?Serum creatinine is back to baseline ?Serum sodium has trended down to 145 ?Continue aggressive electrolyte replacement ? ?HTN ?Continue lisinopril, clonidine, coreg, norvasc  ? ?Resolving ileus ?Inadequate PO intake  ?Continue tube feeds ? ?Poorly controlled diabetes with hyperglycemia ?Patient's hemoglobin A1c is 8.1 ?Now fingersticks are better controlled ?Continue sliding scale and long-acting insulin ? ?Best Practice (right click and "Reselect all SmartList Selections" daily)  ? ?Diet/type: tubefeeds ?DVT prophylaxis: LMWH ?GI prophylaxis: PPI ?Lines: N/A ?Foley:  N/A  ?Code Status:  full code ?Last date of multidisciplinary goals of care discussion [Wife updated at bedside 5/8] ? ?Total critical care time: 35 minutes ? ?Performed by: Jacky Kindle ?  ?Critical care time was exclusive of separately billable procedures and treating other patients. ?  ?Critical care was necessary to treat or prevent imminent or life-threatening deterioration. ?  ?Critical care was time spent personally by me on the following activities: development of treatment plan with patient and/or surrogate as well as nursing, discussions with consultants, evaluation of patient's response to treatment, examination of patient, obtaining history from patient or surrogate, ordering and performing treatments and interventions, ordering and review of laboratory studies, ordering and review of radiographic studies, pulse oximetry and re-evaluation of patient's condition. ?  ?Jacky Kindle MD ?[facility] ?See Amion for pager ?If no response to pager, please call [phone] until 7pm ?After 7pm, Please call E-link [phone] ?There is no other choice left like we do not ? ?

## 2021-09-29 NOTE — Progress Notes (Signed)
Pharmacy Antibiotic Note ? ?VERBON [name] is a 46 y.o. male admitted on 09/12/2021 with R MCA s/p TNK, IR, and hemicraniectomy with MRSA bacteremia and pneumonia. Also some concern for varicella zoster rash.  Pharmacy has been consulted for IV acyclovir and vancomycin dosing. Scr today has trended down to < 1 with CrCl > 100 ml/min. Repeat blood culture NGTD from 5/6.  ? ? ? ?Plan: ?Adjust Vancomycin to 1500 mg every 12 hours with improved renal function (Predicted AUC 457 with Scr rounded to 1)  ?Continue acyclovir 800mg  q8hr along with LR @ 75 ml/hr ?F/U LOT acyclovir  ?F/u renal function, length of therapy and levels as indicated ? ?Height: 6\' 1"  (185.4 cm) ?Weight: 102.1 kg (225 lb 1.4 oz) ?IBW/kg (Calculated) : 79.9 ? ?Temp (24hrs), Avg:99.5 ?F (37.5 ?C), Min:98.5 ?F (36.9 ?C), Max:100.2 ?F (37.9 ?C) ? ?Recent Labs  ?Lab 09/25/21 ?0411 09/26/21 ?0405 09/26/21 ?1332 09/27/21 ?[id number] 09/27/21 ?[id number] 09/28/21 ?[id number] 09/29/21 ?[id number]  ?WBC 17.2* 13.9*  --  10.7*  --  10.9* 12.9*  ?CREATININE 1.47* 1.23  --  1.17  1.13  --  1.04 0.80  ?Tyronza  --   --   --   --  7*  --   --   ?VANCOPEAK  --   --  40*  --   --   --   --   ? ?  ?Estimated Creatinine Clearance: 141.8 mL/min (by C-G formula based on SCr of 0.8 mg/dL).   ? ?Allergies  ?Allergen Reactions  ? Hydrochlorothiazide Other (See Comments)  ?  Bilateral muscle cramping  ? ? ?Thank you for allowing pharmacy to participate in this patient's care. ? ?Jacob [name], PharmD, BCPS, BCIDP ?Infectious Diseases Clinical Pharmacist ?Phone: [phone] ?09/29/2021,8:26 AM ? ? ? ?

## 2021-09-29 NOTE — Progress Notes (Signed)
[facility] ADULT ICU REPLACEMENT PROTOCOL ? ? ?The patient does apply for the [facility] Adult ICU Electrolyte Replacment Protocol based on the criteria listed below:  ? ?1.Exclusion criteria: TCTS patients, ECMO patients, and Dialysis patients ?2. Is GFR >/= 30 ml/min? Yes.    ?Patient's GFR today is >60 ?3. Is SCr </= 2? Yes.   ?Patient's SCr is 0.80 mg/dL ?4. Did SCr increase >/= 0.5 in 24 hours? No. ?5.Pt's weight >40kg  Yes.   ?6. Abnormal electrolyte(s): K  ?7. Electrolytes replaced per protocol ?8.  Call MD STAT for K+ </= 2.5, Phos </= 1, or Mag </= 1 ?Physician:  Prudencio Burly ? ?Kinley Dozier E Durga Saldarriaga 09/29/2021 4:39 AM  ?

## 2021-09-30 ENCOUNTER — Inpatient Hospital Stay (HOSPITAL_COMMUNITY): Payer: BC Managed Care – PPO

## 2021-09-30 DIAGNOSIS — I639 Cerebral infarction, unspecified: Secondary | ICD-10-CM | POA: Diagnosis not present

## 2021-09-30 DIAGNOSIS — G936 Cerebral edema: Secondary | ICD-10-CM | POA: Diagnosis not present

## 2021-09-30 DIAGNOSIS — J9601 Acute respiratory failure with hypoxia: Secondary | ICD-10-CM | POA: Diagnosis not present

## 2021-09-30 LAB — CBC
HCT: 29.2 % — ABNORMAL LOW (ref 39.0–52.0)
Hemoglobin: 9.3 g/dL — ABNORMAL LOW (ref 13.0–17.0)
MCH: 28.5 pg (ref 26.0–34.0)
MCHC: 31.8 g/dL (ref 30.0–36.0)
MCV: 89.6 fL (ref 80.0–100.0)
Platelets: 384 10*3/uL (ref 150–400)
RBC: 3.26 MIL/uL — ABNORMAL LOW (ref 4.22–5.81)
RDW: 13.6 % (ref 11.5–15.5)
WBC: 13.1 10*3/uL — ABNORMAL HIGH (ref 4.0–10.5)
nRBC: 0.2 % (ref 0.0–0.2)

## 2021-09-30 LAB — BASIC METABOLIC PANEL
Anion gap: 6 (ref 5–15)
BUN: 19 mg/dL (ref 6–20)
CO2: 24 mmol/L (ref 22–32)
Calcium: 8.3 mg/dL — ABNORMAL LOW (ref 8.9–10.3)
Chloride: 114 mmol/L — ABNORMAL HIGH (ref 98–111)
Creatinine, Ser: 0.88 mg/dL (ref 0.61–1.24)
GFR, Estimated: >60 mL/min (ref >60)
Glucose, Bld: 176 mg/dL — ABNORMAL HIGH (ref 70–99)
Potassium: 3.5 mmol/L (ref 3.5–5.1)
Sodium: 144 mmol/L (ref 135–145)

## 2021-09-30 LAB — GLUCOSE, CAPILLARY
Glucose-Capillary: 181 mg/dL — ABNORMAL HIGH (ref 70–99)
Glucose-Capillary: 161 mg/dL — ABNORMAL HIGH (ref 70–99)
Glucose-Capillary: 206 mg/dL — ABNORMAL HIGH (ref 70–99)
Glucose-Capillary: 170 mg/dL — ABNORMAL HIGH (ref 70–99)
Glucose-Capillary: 168 mg/dL — ABNORMAL HIGH (ref 70–99)

## 2021-09-30 IMAGING — DX DG ABDOMEN 1V
1 series · 1 of 1 positions shown · non-contrast
Comparison: [DATE]

CLINICAL DATA: Ileus

EXAM:
ABDOMEN - 1 VIEW

[abdomen supine]
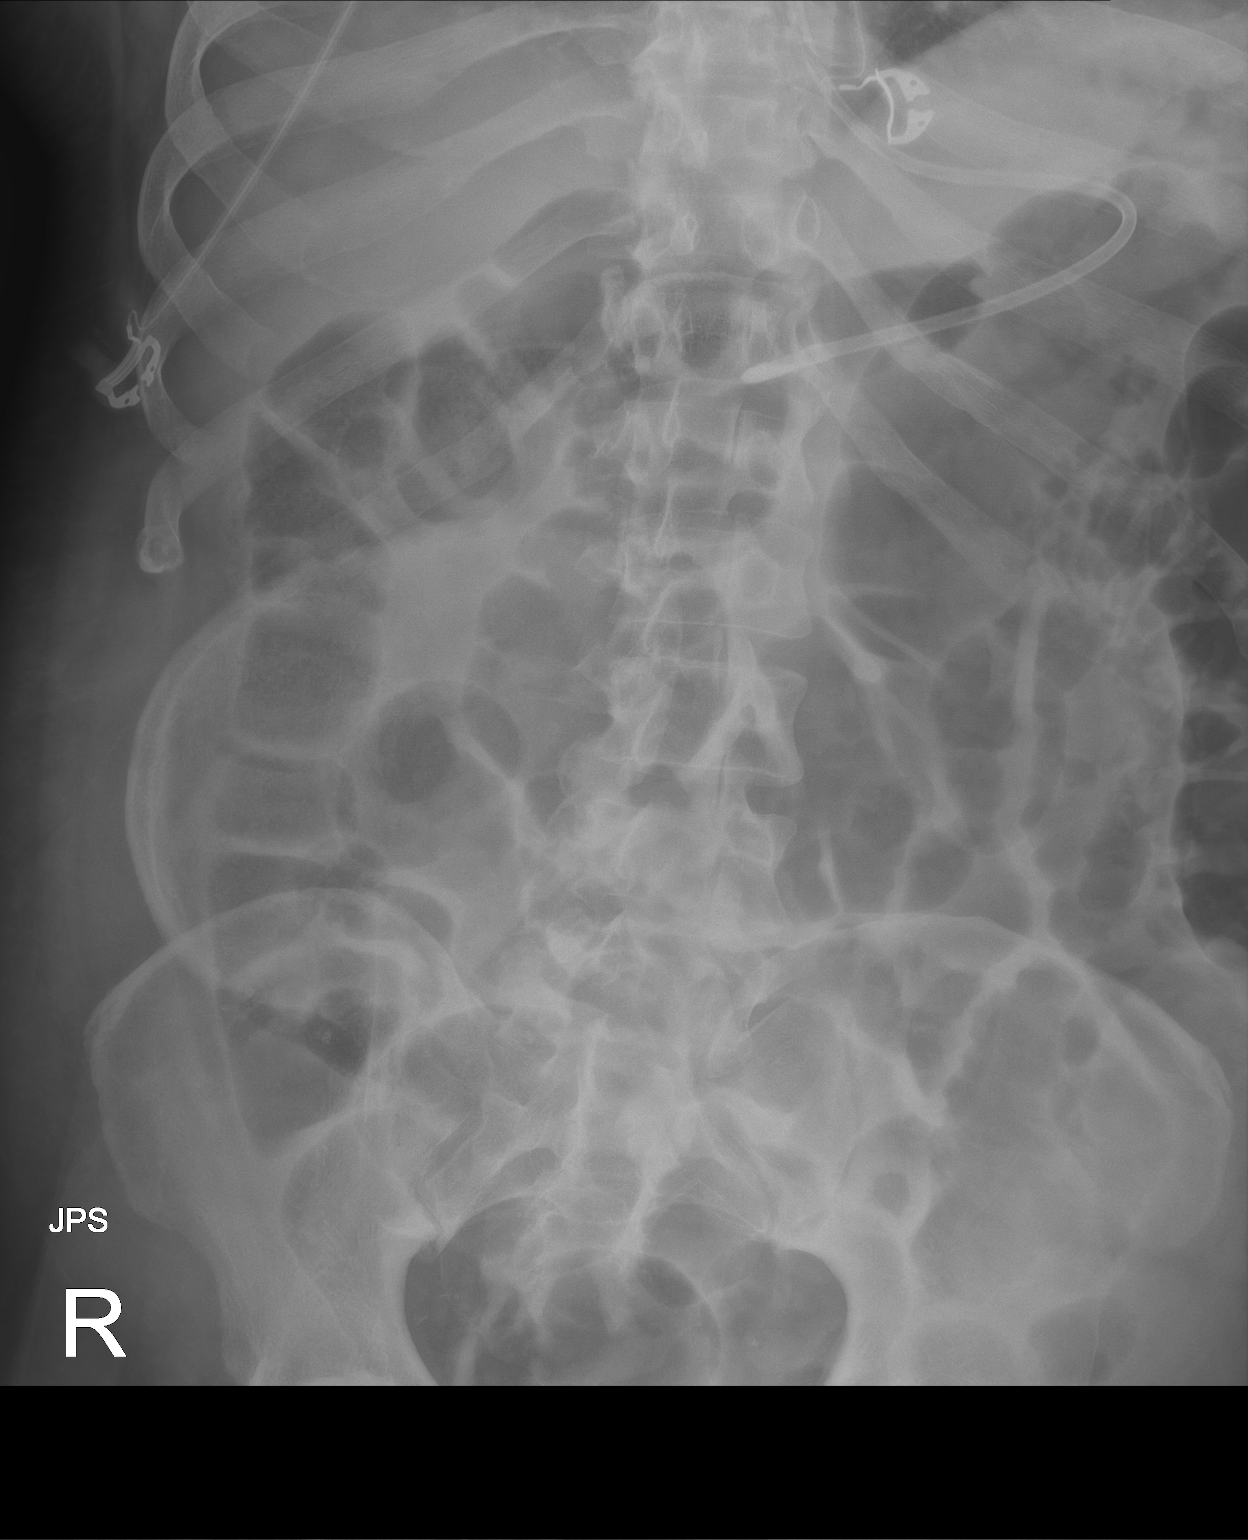

[1 of 1 positions shown; findings below may reference images not displayed]

FINDINGS: [G6] hours. Feeding tube tip is in the distal stomach. Diffuse
gaseous distention of large and small bowel is similar to prior. Gas
pattern remains compatible with ileus.
IMPRESSION: No substantial change in diffuse gaseous distention of large and
small bowel compatible with ileus.

## 2021-09-30 MED ORDER — POTASSIUM CHLORIDE 20 MEQ PO PACK
40.0000 meq | PACK | Freq: Once | ORAL | Status: AC
  Administered 2021-10-01: 40 meq

## 2021-09-30 MED ORDER — GLYCOPYRROLATE 1 MG PO TABS: 1.0000 mg | ORAL_TABLET | Freq: Two times a day (BID) | ORAL | Status: DC | PRN

## 2021-09-30 MED ORDER — GLYCOPYRROLATE 1 MG PO TABS
1.0000 mg | ORAL_TABLET | Freq: Two times a day (BID) | ORAL | Status: DC | PRN
  Administered 2021-09-30: 1 mg via ORAL
  Filled 2021-09-30 (×2): qty 1

## 2021-09-30 MED ORDER — POTASSIUM CHLORIDE 10 MEQ/100ML IV SOLN
10.0000 meq | INTRAVENOUS | Status: AC
  Administered 2021-09-30 (×2): 10 meq via INTRAVENOUS
  Filled 2021-09-30 (×2): qty 100

## 2021-09-30 MED ORDER — GLYCOPYRROLATE 1 MG PO TABS
1.0000 mg | ORAL_TABLET | Freq: Two times a day (BID) | ORAL | Status: AC | PRN
  Filled 2021-09-30: qty 1

## 2021-09-30 MED ORDER — POTASSIUM CHLORIDE 10 MEQ/100ML IV SOLN
10.0000 meq | INTRAVENOUS | Status: DC
  Administered 2021-09-30: 10 meq via INTRAVENOUS
  Filled 2021-09-30 (×2): qty 100

## 2021-09-30 MED ORDER — PROSOURCE TF PO LIQD
45.0000 mL | Freq: Two times a day (BID) | ORAL | Status: DC
  Administered 2021-09-30 – 2021-10-07 (×14): 45 mL
  Filled 2021-09-30 (×14): qty 45

## 2021-09-30 NOTE — Progress Notes (Addendum)
STROKE TEAM PROGRESS NOTE  ? ?INTERVAL HISTORY ?Patient is seen in his room with his wife at the bedside.  He has been hemodynamically stable overnight and afebrile.  His neurological exam is stable, and his flap is full and mildly tense.  He has been able to tolerate trach collar for >24 hours and is ready to be moved out of the ICU.  He has a strong cough and is usually able to expectorate his secretions per nursing.  Patient will possibly be going to CIR soon. ?Vital signs are stable. ?Vitals:  ? 09/30/21 1100 09/30/21 1136 09/30/21 1200 09/30/21 1353  ?BP: 113/86  (!) 146/96   ?Pulse: 82 88 82   ?Resp: (!) 25 (!) 34 (!) 26   ?Temp:   (!) 97.1 ?F (36.2 ?C)   ?TempSrc:   Axillary   ?SpO2: 100% 100% 95% 100%  ?Weight:      ?Height:      ? ?CBC:  ?Recent Labs  ?Lab 09/26/21 ?0405 09/27/21 ?0326 09/29/21 ?[id number] 09/30/21 ?0038  ?WBC 13.9*   < > 12.9* 13.1*  ?NEUTROABS 11.2*  --   --   --   ?HGB 10.1*   < > 9.0* 9.3*  ?HCT 31.6*   < > 28.1* 29.2*  ?MCV 90.0   < > 90.4 89.6  ?PLT 210   < > 242 384  ? < > = values in this interval not displayed.  ? ? ?Basic Metabolic Panel:  ?Recent Labs  ?Lab 09/25/21 ?0411 09/26/21 ?0405 09/27/21 ?[id number] 09/28/21 ?[id number] 09/29/21 ?[id number] 09/30/21 ?0038  ?NA 152* 151* 153*  152*   < > 145 144  ?K 3.7 3.4* 3.2*  3.1*   < > 3.4* 3.5  ?CL 119* 120* 123*  122*   < > 117* 114*  ?CO2 22 23 22  22    < > 22 24  ?GLUCOSE 150* 163* 130*  129*   < > 108* 176*  ?BUN 57* 48* 36*  36*   < > 20 19  ?CREATININE 1.47* 1.23 1.17  1.13   < > 0.80 0.88  ?CALCIUM 8.8* 8.4* 8.2*  8.2*   < > 8.3* 8.3*  ?MG 2.8*  --   --   --   --   --   ?PHOS 4.1 4.0 3.4  --   --   --   ? < > = values in this interval not displayed.  ? ? ?Lipid Panel:  ?No results for input(s): CHOL, TRIG, HDL, CHOLHDL, VLDL, LDLCALC in the last 168 hours. ? ?HgbA1c:  ?No results for input(s): HGBA1C in the last 168 hours. ? ?Urine Drug Screen:  ?No results for input(s): LABOPIA, COCAINSCRNUR, LABBENZ, AMPHETMU, THCU, LABBARB in the last 168  hours. ?  ?Alcohol Level No results for input(s): ETH in the last 168 hours. ? ?IMAGING past 24 hours ?DG Abd 1 View ? ?Result Date: 09/30/2021 ?CLINICAL DATA:  Ileus EXAM: ABDOMEN - 1 VIEW COMPARISON:  09/28/2021 FINDINGS: 0835 hours. Feeding tube tip is in the distal stomach. Diffuse gaseous distention of large and small bowel is similar to prior. Gas pattern remains compatible with ileus. IMPRESSION: No substantial change in diffuse gaseous distention of large and small bowel compatible with ileus. Electronically Signed   By: Misty Stanley M.D.   On: 09/30/2021 09:06   ? ?PHYSICAL EXAM ? ?Physical Exam  ?Constitutional: Appears well-developed and well-nourished middle-age is sedated and intubated African-American male, intubated hemi craniectomy surgical incision  ?Cardiovascular: Normal rate and regular  rhythm.  ?Respiratory: Respirations regular and unlabored on trach collar ? ?Neuro (s/p trach): Awake alert and interactive.  Right gaze deviation, blink to threat. Following commands on the right and midline.  PERRL, cough and gag reflexes present.  Dense left hemiplegia.  Antigravity strength on the right ? ?ASSESSMENT/PLAN ?Jacob [name] is a 46 y.o. male with history of HTN, DM2 presenting with left side facial droop, left arm weakness. Received TNKase. Neuro status declined during MRI (NIHSS 13),taken for thrombectomy with Dr. Karenann Cai. TICI3 recanalization of the right M2/MCA achieved.  MRI/MRA shows reoccluded right M2 branch with a right MCA territory infarct.  Continue with PT/OT/SLP evals.  Home blood pressure medications resumed. BP goal less than 160. Topamax and Tylenol #3 for headache management.  Head CT shows new 44mm leftward midline shift. Trasnferred back to ICU, decompressive hemicraniectomy performed by neurosurgery, bone flap placed in abdomen.  Follow-up CT shows 5 mm left midline shift.   HTS d/c'd due to Na of 160.  Tracheostomy placed 5/1.  Now febrile, empiric antibiotic  started by CCM.  Tracheal aspirate cultured and blood culture sent.  Blood cultures show no growth as of 5/6, respiratory culture shows MRSA.  Varicella PCR negative ? ?Stroke:  Right MCA due to right MCA occlusion  s/p TNK and IR with TICI3, etiology not certain, but most likely cryptogenic versus intracranial atherosclerosis. ?Code Stroke CT head No acute abnormality ?CTA head & neck R short segment M1 occlusion, left distal M1 high grade stenosis ?Post IR CT No hemorrhagic complication on flat panel CT. ?4/21- Early MRI Tiny acute infarcts in the upper division right MCA cortex. ?4/22- MRI right MCA territory infarct involving the cortex, sparing the basal ganglia.  2 mm midline shift.  No hemorrhage ?4/22- MRA reoccluded right M2 branch, intracranial atherosclerosis including advanced left M1 stenosis ?4/24-  Head CT-increased edema resulting in near complete effacement of the right lateral ventricle and 12 mm leftward midline shift ?4/25- Head CT Post OP- 5 mm left midline shift, new area of hypoattenuation of the left middle cerebellar peduncle and cerebellum ?4/28 head CT large right MCA infarct with no HT, diminished mass effect with no midline shift, hemicraniectomy noted ?5/3 CT head stable, no midline shift, large right MCA infarct ?2D Echo EF > 75% ?LDL 96 ?HgbA1c 8.1 ?UDS negative ?VTE prophylaxis - SCDs ?No antithrombotic prior to admission, now on ASA 81.  ?Therapy recommendations: PT/OT at LTAC vs. CIR ?disposition:  Pending ? ?Cerebral Edema s/p [facility] ?Head CT 4/24 shows increased edema with new 73mm midline shift ?Decompressive hemicraniectomy performed 4/24 by neurosurgery ?Remove staples on postop day 10 ?Neurosurgery plans to replace bone flap when swelling subsides, bone flap in abdomen ?Na 155->160->159-> 156->153-> 150-> 152->151-> 145-> 144 ?Off HTS->FW->LR @ 100 ?No midline shift seen on head CT 4/28 and 5/3 ?On keppra 500mg  bid ?Allow Na gradually trending down ? ?Intracranial stenosis ?CTA  head & neck R short segment M1 occlusion, left distal M1 high grade stenosis ?No significant extracranial stenosis ?Most likely related to long standing uncontrolled risk factors. ? ?Hypertension ?Home meds:  Amlodipine 10mg , lisinopril 10mg -resumed ?Stable ?BP goal <160  ?On coreg 25, amlodipine 10, clonidine 0.2 Q8, lisinopril 40  ?PRN labetalol and hydralazine ?Long-term BP goal normotensive ? ?Hyperlipidemia ?Home meds:  Atorvastatin 20mg  ?LDL 96, goal < 70 ?Put on lipitor 40 ?Continue statin at discharge ? ?Diabetes type II Uncontrolled ?Home meds:  Glipizide, metformin ?HgbA1c 8.1, goal < 7.0 ?CBGs ?SSI with  resist scale, Levemir 11->40 units twice daily ?DM coordinator consulted ? ?Other Stroke Risk Factors ?Obesity, Body mass index is 30.98 kg/m?., BMI >/= 30 associated with increased stroke risk, recommend weight loss, diet and exercise as appropriate  ? ?Dysphagia  ?Ileus ?N.p.o. postop, trached ?Cortrak in place ?Gastric tube for decompression per CCM ?KUB 5/3 unchanged, will repeat KUB today ?On LR @ 75and D5 @ 25 ?On trickle feeds now @ 20  ? ?Respiratory failure ?Patient left intubated after surgery ?Ventilator management per CCM ?Tracheostomy placed 5/1 ?On trach collar today ? ?Fever and leukocytosis ??Chicken pox ?Initial blood culture showed staph, now no growth as of 5/6 ?Tmax for 24 hours 38.3 ?WBC 12.3-14.5-18.5-17.2 -> 13.9->10.7-> 13.1 ?Body rash  ?VZV IgM-negative ?VZV PCR negative ?Tracheal aspirate + MRSA -> On vancomycin ? ?Post stroke depression: start lexapro. ? ?Hospital day # 18 ? ?Patient seen and examined by NP/APP with MD. MD to update note as needed.  ? ?[place] , MSN, AGACNP-BC ?[facility] ?See Amion for schedule and pager information ?09/30/2021 2:18 PM ?  ? ?I have personally obtained history,examined this patient, reviewed notes, independently viewed imaging studies, participated in medical decision making and plan of care.ROS completed by me personally  and pertinent positives fully documented  I have made any additions or clarifications directly to the above note. Agree with note above.  Has been able to tolerate trach collar now for more than 24 hours thi

## 2021-09-30 NOTE — Progress Notes (Signed)
PT Cancellation Note ? ?Patient Details ?Name: Jacob [name] ?MRN: [id number] ?DOB: June 09, 1975 ? ? ?Cancelled Treatment:    Reason Eval/Treat Not Completed: Medical issues which prohibited therapy (respiratory distress). Will check back as time allows.  ? ?Leighton Roach, PT  ?Acute Rehab Services ? Pager [phone] ?Office [phone] ? ? ? ?[place] ?09/30/2021, 1:20 PM ?

## 2021-09-30 NOTE — Plan of Care (Signed)
?  Problem: Education: ?Goal: Knowledge of disease or condition will improve ?Outcome: Progressing ?  ?Problem: Self-Care: ?Goal: Ability to participate in self-care as condition permits will improve ?Outcome: Progressing ?  ?Problem: Nutrition: ?Goal: Risk of aspiration will decrease ?Outcome: Progressing ?Goal: Dietary intake will improve ?Outcome: Progressing ?  ?Problem: Ischemic Stroke/TIA Tissue Perfusion: ?Goal: Complications of ischemic stroke/TIA will be minimized ?Outcome: Progressing ?  ?

## 2021-09-30 NOTE — Progress Notes (Signed)
eLink Physician-Brief Progress Note ?Patient Name: Jacob [name] ?DOB: Sep 25, 1975 ?MRN: [id number] ? ? ?Date of Service ? 09/30/2021  ?HPI/Events of Note ? Patient with excessive tracheostomy secretions.  ?eICU Interventions ? Glycopyrrolate 1 mg po bid PRN excessive secretions x 2 doses ordered.   ? ? ? ?  ? ?[name] ?09/30/2021, 7:56 PM ?

## 2021-09-30 NOTE — Evaluation (Signed)
[facility] Speaking Valve - Evaluation ?Patient Details  ?Name: Jacob [name] ?MRN: [name] ?Date of Birth: 04/26/1976 ? ?Today's Date: 09/30/2021 ?Time: [id number] ?SLP Time Calculation (min) (ACUTE ONLY): 27 min ? ?Past Medical History:  ?Past Medical History:  ?Diagnosis Date  ? Allergy   ? Diabetes mellitus without complication ([place])   ? Hypertension   ? ?Past Surgical History:  ?Past Surgical History:  ?Procedure Laterality Date  ? APPENDECTOMY    ? CRANIOTOMY Right 09/15/2021  ? Procedure: RIGHT DECOMPRESSIVE CRANIOTOMY, PLACEMENT OF BONE FLAP IN ABDOMEN;  Surgeon: Newman Pies, MD;  Location: [facility];  Service: Neurosurgery;  Laterality: Right;  ? IR CT HEAD LTD  09/12/2021  ? IR PERCUTANEOUS ART THROMBECTOMY/INFUSION INTRACRANIAL INC DIAG ANGIO  09/12/2021  ? IR US GUIDE VASC ACCESS RIGHT  09/12/2021  ? LAPAROSCOPIC APPENDECTOMY  10/24/2013  ? Procedure: APPENDECTOMY LAPAROSCOPIC;  Surgeon: Shann Medal, MD;  Location: [facility];  Service: General;;  ? RADIOLOGY WITH ANESTHESIA N/A 09/12/2021  ? Procedure: IR WITH ANESTHESIA;  Surgeon: Luanne Bras, MD;  Location: [facility];  Service: Radiology;  Laterality: N/A;  ? ?HPI:  ?Pt is a 46 y.o. male with who presented with left sided facial droop. TNK given. MRI brain 4/21: Tiny acute infarcts in the upper division right MCA cortex. Worsening neuro status noted after MRI with left-sided neglect, right gaze preference, sensory and vision loss, and progressive left sided weakness. Pt s/p  thrombectomy and revascularization 4/21. Dys 2 diet and thin liquids was started by SLP 4/21. Pt underwent R frontotemporal parietal hemicraniectomy on 4/24 after declining 4/23. Intubated 4/24; trach 5/1. PMH: HTN and DM  ? ? ?Assessment / Plan / Recommendation ? ?Clinical Impression ? Pt's cuff was deflated at baseline and RT was present to provide tracheal suction as well as echange of inner cannula prior to [place] placement. Pt did still have trouble accessing his upper airway, as  evidenced by change in respirations and back pressure that developed quickly, within the first several seconds of PMV placement. Pt sounded audibly wet upon placement too. Suspect he had additional secretions above where he would have been suctioned. Cued him to cough and try to expel secretions, but he was not able to expectorate any orally or tracheally. Also suspect that size of trach could be contributing, as he currently has a cuffed #8 [place]. Wife was present and educated on how PMV works and recommendations to use only with SLP at this time. ?SLP Visit Diagnosis: Aphonia (R49.1)  ?  ?SLP Assessment ? Patient needs continued Speech Lanaguage Pathology Services  ?  ?Recommendations for follow up therapy are one component of a multi-disciplinary discharge planning process, led by the attending physician.  Recommendations may be updated based on patient status, additional functional criteria and insurance authorization. ? ?Follow Up Recommendations ? Acute inpatient rehab (3hours/day) ? ?  ?Assistance Recommended at Discharge Frequent or constant Supervision/Assistance  ?Functional Status Assessment Patient has had a recent decline in their functional status and demonstrates the ability to make significant improvements in function in a reasonable and predictable amount of time.  ?Frequency and Duration min 2x/week  ?2 weeks ?  ? ?PMSV Trial PMSV was placed for: few seconds ?Able to redirect subglottic air through upper airway: No ?Able to Attain Phonation: No ?Able to Expectorate Secretions: No ?Level of Secretion Expectoration with PMSV: Not observed ?Breath Support for Phonation: Inadequate ?Respirations During Trial: 20 ?SpO2 During Trial: 100 % ?Pulse During Trial: 80 ?Behavior: Lethargic ?  ?  Tracheostomy Tube ?    ?  ?Vent Dependency ?    ?  ?Cuff Deflation Trial Tolerated Cuff Deflation:  (deflated at baseline)  ?   ? ? ?  ?Osie Bond., M.A. CCC-SLP ?Acute Rehabilitation Services ?Office  [phone] ? ?Secure chat preferred ? ?09/30/2021, 12:31 PM ? ?

## 2021-09-30 NOTE — Progress Notes (Signed)
[facility] ADULT ICU REPLACEMENT PROTOCOL ? ? ?The patient does apply for the [facility] Adult ICU Electrolyte Replacment Protocol based on the criteria listed below:  ? ?1.Exclusion criteria: TCTS patients, ECMO patients, and Dialysis patients ?2. Is GFR >/= 30 ml/min? Yes.    ?Patient's GFR today is >60 ?3. Is SCr </= 2? Yes.   ?Patient's SCr is 0.88 mg/dL ?4. Did SCr increase >/= 0.5 in 24 hours? No. ?5.Pt's weight >40kg  Yes.   ?6. Abnormal electrolyte(s):   K 3.5  ?7. Electrolytes replaced per protocol ?8.  Call MD STAT for K+ </= 2.5, Phos </= 1, or Mag </= 1 ?Physician:  Wyn Forster ? ?Sedonia Small 09/30/2021 5:49 AM ? ?

## 2021-09-30 NOTE — Progress Notes (Signed)
OT Cancellation Note ? ?Patient Details ?Name: Jacob [name] ?MRN: [name] ?DOB: 1976/05/12 ? ? ?Cancelled Treatment:    Reason Eval/Treat Not Completed: Medical issues which prohibited therapy. Pt with copious amounts of secretions. Nsg called to suction pt. SpO2 @87 -88 with increased RR at this time after suctioning; RT called by Nsg. Will hold at this time. ? ?Xiao Graul,HILLARY ?09/30/2021, 11:59 AM ?Maurie Boettcher, OT/L  ? ?Acute OT Clinical Specialist ?Acute Rehabilitation Services ?Pager [phone] ?Office [phone]  ?

## 2021-09-30 NOTE — Evaluation (Signed)
Speech Language Pathology Evaluation ?Patient Details ?Name: Jacob [name] ?MRN: [id number] ?DOB: 31-Dec-1975 ?Today's Date: 09/30/2021 ?Time: [id number] ?SLP Time Calculation (min) (ACUTE ONLY): 27 min ? ?Problem List:  ?Patient Active Problem List  ? Diagnosis Date Noted  ? Pneumonia of both lower lobes due to methicillin resistant Staphylococcus aureus (MRSA) ([place])   ? Cerebral embolism with cerebral infarction 09/20/2021  ? Hypernatremia 09/19/2021  ? Urinary retention 09/18/2021  ? Fever 09/18/2021  ? Acute respiratory failure with hypoxia ([place])   ? Cerebral edema ([place]) 09/15/2021  ? Acute ischemic stroke ([place]) 09/12/2021  ? Vitamin D deficiency 11/04/2020  ? Hypertensive urgency 10/31/2020  ? Hyperlipidemia 03/28/2018  ? Diabetes mellitus without complication ([place]) [id number]  ? Essential hypertension 02/23/2018  ? ?Past Medical History:  ?Past Medical History:  ?Diagnosis Date  ? Allergy   ? Diabetes mellitus without complication ([place])   ? Hypertension   ? ?Past Surgical History:  ?Past Surgical History:  ?Procedure Laterality Date  ? APPENDECTOMY    ? CRANIOTOMY Right 09/15/2021  ? Procedure: RIGHT DECOMPRESSIVE CRANIOTOMY, PLACEMENT OF BONE FLAP IN ABDOMEN;  Surgeon: Newman Pies, MD;  Location: [place];  Service: Neurosurgery;  Laterality: Right;  ? IR CT HEAD LTD  09/12/2021  ? IR PERCUTANEOUS ART THROMBECTOMY/INFUSION INTRACRANIAL INC DIAG ANGIO  09/12/2021  ? IR US GUIDE VASC ACCESS RIGHT  09/12/2021  ? LAPAROSCOPIC APPENDECTOMY  10/24/2013  ? Procedure: APPENDECTOMY LAPAROSCOPIC;  Surgeon: Shann Medal, MD;  Location[facility];  Service: General;;  ? RADIOLOGY WITH ANESTHESIA N/A 09/12/2021  ? Procedure: IR WITH ANESTHESIA;  Surgeon: Luanne Bras, MD;  Location: [facility];  Service: Radiology;  Laterality: N/A;  ? ?HPI:  ?Pt is a 46 y.o. male with who presented with left sided facial droop. TNK given. MRI brain 4/21: Tiny acute infarcts in the upper division right MCA cortex. Worsening neuro status noted after  MRI with left-sided neglect, right gaze preference, sensory and vision loss, and progressive left sided weakness. Pt s/p  thrombectomy and revascularization 4/21. Dys 2 diet and thin liquids was started by SLP 4/21. Pt underwent R frontotemporal parietal hemicraniectomy on 4/24 after declining 4/23. Intubated 4/24; trach 5/1. PMH: HTN and DM  ? ?Assessment / Plan / Recommendation ?Clinical Impression ? Pt was seen for reassessment of cognitive and communicative abilties, s/p hemicraniectomy and trach since last eval. He is sleepy today, needing frequent cues to attend, but when engaged he will use his R hand to gesture thumbs up/down in response to yes/no questions. He was mostly accurate with these questions except when he was asked questions for which he would have to open his eyes and look around his room. He mostly kept his eyes closed, but did open them to command with gaze deviated to the R. He did not attend to his L side and did not bring his gaze to midline or beyond. He followed several one-step commands when attentive. His wife said he has been asking to write, and started writing one-word repsonses yesterday. She also had a large picture board in the room. She shared that this makes him frustrated at times, and we discussed how this board may be overwhelming to him at the moment and he may have difficulty scanning to at least the L half of the page. Will f/u as able to maximize communication and cognition with further assessment to be provided as his alertness improves. Note that swallow eval was held today in light of level of secretions, decreased tolerance  of PMV, and lethargy. Will follow for completion once medically appropriate. ?   ?SLP Assessment ? SLP Recommendation/Assessment: Patient needs continued [place] Pathology Services ?SLP Visit Diagnosis: Cognitive communication deficit (R41.841)  ?  ?Recommendations for follow up therapy are one component of a multi-disciplinary discharge  planning process, led by the attending physician.  Recommendations may be updated based on patient status, additional functional criteria and insurance authorization. ?   ?Follow Up Recommendations ? Acute inpatient rehab (3hours/day)  ?  ?Assistance Recommended at Discharge ? Frequent or constant Supervision/Assistance  ?Functional Status Assessment Patient has had a recent decline in their functional status and demonstrates the ability to make significant improvements in function in a reasonable and predictable amount of time.  ?Frequency and Duration min 2x/week  ?2 weeks ?  ?   ?SLP Evaluation ?Cognition ? Overall Cognitive Status: Impaired/Different from baseline ?Arousal/Alertness: Lethargic ?Orientation Level: Intubated/Tracheostomy - Unable to assess ?Attention: Sustained ?Sustained Attention: Impaired ?Sustained Attention Impairment: Verbal basic ?Awareness: Impaired ?Awareness Impairment: Emergent impairment ?Problem Solving: Impaired ?Problem Solving Impairment: Functional basic ?Behaviors: Other (comment) (not attending to L side, eyes did not reach midline) ?Safety/Judgment: Impaired  ?  ?   ?Comprehension ? Auditory Comprehension ?Overall Auditory Comprehension: Impaired ?Yes/No Questions: Impaired ?Basic Biographical Questions: 76-100% accurate ?Basic Immediate Environment Questions: 50-74% accurate ?Commands: Impaired ?One Step Basic Commands: 75-100% accurate  ?  ?Expression Expression ?Primary Mode of Expression: Verbal ?Verbal Expression ?Overall Verbal Expression:  (needs further assessment; has been writing some single words to wife over the last day or so but otherwise limite 2/2 trach) ?Written Expression ?Dominant Hand: Right   ?Oral / Motor ? Motor Speech ?Overall Motor Speech:  (UTA)   ?        ? ? ?Osie Bond., M.A. CCC-SLP ?Acute Rehabilitation Services ?Office [phone] ? ?Secure chat preferred ? ?09/30/2021, 12:47 PM ? ?

## 2021-09-30 NOTE — Progress Notes (Signed)
? ?NAME:  Jacob [name], MRN:  [id number], DOB:  1976/03/01, LOS: 28 ?ADMISSION DATE:  09/12/2021, CONSULTATION DATE:  09/15/2021 ?REFERRING MD:  Leonie Man - Stroke, CHIEF COMPLAINT:  Cerebral edema.   ? ?History of Present Illness:  ?46 year old man with worsening cerebral edema. ? ?He presented 4/21 with left-sided weakness which was initially waxing and waning. CTA showed right M1 occlusion.  Further decline post TNK brought for thrombectomy at [facility] 3 revascularization of right M2. ?Transfer to floor but experienced neurological decline 4/24.  CT showed malignant cerebral edema with 12 mm right to left midline shift and Trapping of the temporal horn. ? ?He was brought back to the ICU and started on 3% hypertonic saline and prepped for the operating room.  Platelets were transfused to offset effect of clopidogrel and aspirin. ? ?Taken to OR and underwent right frontotemporal parietal hemicraniectomy with placement of bone flap in abdominal subcutaneous tissue. Returned to ICU on vent. ? ?Pertinent  Medical History  ? ?Past Medical History:  ?Diagnosis Date  ? Allergy   ? Diabetes mellitus without complication ([place])   ? Hypertension   ? ?Significant Hospital Events: ?Including procedures, antibiotic start and stop dates in addition to other pertinent events   ?4/24 decompressive craniectomy. ?4/25 decreased midline shift on CT ?4/26 still on HT saline.  Switched from prop to precedex. BP meds titrated up in effort to decrease cleviprex. Cortrak placed.  ?4/27 Cleviprex maxed out, started on labetolol gtt. Started scheduled clonidine, also started coreg. Foley replaced and started on doxazosin due to urinary retention. HT infusion cut in half w/ plan to work towards stopping. Stopped fent gtt. Changed to PRN w/ plan to start precedex if needed.  Checking [place] of UE to eval fever. Also sending PCT ?5/1 trached, c/o pain upper abd, tmax 101.3, diarrhea overnight ?5/2 febrile 102.7, WBC up, c/o ongoing abd pain/distention, KUB  c/w ileus, cultured and started on vanc/ cefepime, NGT placed for decompression. ?5/3 suspected VZV infection. Added acyclovir  ?5/4 failed SBT with apnea  ?5/5 working with PT  ?5/6 Bcx +MRSA ?5/7 tolerating PSV, still with irregular breathing patterns ? ?Interim History / Subjective:  ?Patient tolerating trach collar >24h ? ? ?Objective   ?Blood pressure (!) 152/97, pulse 91, temperature 100.2 ?F (37.9 ?C), temperature source Axillary, resp. rate (!) 22, height 6\' 1"  (1.854 m), weight 106.5 kg, SpO2 100 %. ?   ?FiO2 (%):  [28 %] 28 %  ? ?Intake/Output Summary (Last 24 hours) at 09/30/2021 1007 ?Last data filed at 09/30/2021 0800 ?Gross per 24 hour  ?Intake 1115.82 ml  ?Output 1875 ml  ?Net -759.18 ml  ? ?Filed Weights  ? 09/28/21 0500 09/29/21 0500 09/30/21 0500  ?Weight: 102.3 kg 102.1 kg 106.5 kg  ? ?Examination: ?Physical exam: ?General: Acute on chronically ill-appearing male, lying on the bed ?HEENT: [place]/AT, eyes anicteric.  moist mucus membranes.  S/p trach, on trach collar ?Neuro: Alert, awake following commands.  Left side is plegic, forced right gaze deviation ?Chest: Coarse breath sounds, no wheezes or rhonchi ?Heart: Regular rate and rhythm, no murmurs or gallops ?Abdomen: Soft, nontender, nondistended, bowel sounds present ?Skin: No rash ? ? ?Assessment & Plan:  ?Acute R MCA stroke s/p tpa and mechanical thrombectomy c/b cerebral edema (brain compression), s/p decompressive R hemicrani with abdominal flap (4/24) ?Continue neuro watch ?Stroke team is following ?Continue aspirin and atorvastatin ?Continue secondary stroke prophylaxis ?Continue amantadine ?Neurosurgery to decide when to put back bone flap ? ?Acute  hypoxic respiratory failure, s/p tracheostomy ?Sepsis due to MRSA PNA  ?MRSA bacteremia ?VZV was ruled out ?Persistent hiccups, improved ?Patient is on trach collar, continue as long as he can tolerate ?Continue IV vancomycin, ID is following ?Continue Thorazine ?Continue chest PT ?Repeat cultures  have been negative ?ID recommended doing TEE ? ?AKI, resolved ?Induced hypernatremia, resolved ?Hypokalemia, resolved ?Monitor BMP and electrolytes ? ?HTN ?Continue lisinopril, clonidine, coreg, norvasc  ? ?Ileus ?Inadequate PO intake  ?KUB still consistent with ileus ?Continue aggressive electrolyte supplement ?Patient started having bowel movements ?Slowly increase tube feeds ? ?Poorly controlled diabetes with hyperglycemia ?Patient's hemoglobin A1c is 8.1 ?Now fingersticks are better controlled ?Continue sliding scale and long-acting insulin ? ?Best Practice (right click and "Reselect all SmartList Selections" daily)  ? ?Diet/type: tubefeeds ?DVT prophylaxis: LMWH ?GI prophylaxis: PPI ?Lines: N/A ?Foley:  N/A  ?Code Status:  full code ?Last date of multidisciplinary goals of care discussion [Wife updated at bedside 5/8] ? ?  ?Jacky Kindle MD ?[facility] ?See Amion for pager ?If no response to pager, please call [phone] until 7pm ?After 7pm, Please call E-link [phone] ? ?

## 2021-09-30 NOTE — Progress Notes (Signed)
Nutrition Follow-up ? ?DOCUMENTATION CODES:  ? ?Not applicable ? ?INTERVENTION:  ? ?If pt does not tolerate slow advancement from trickle TF to goal recommend supplemental TPN until ileus resolved and pt tolerating TF based on ASPEN/SCCM guidelines.   ? ?Tube feeding via cortrak tube: ?Vital 1.5 at 40 ml/h (1440 ml per day) ? ?Increasing by 10 ml every 12 hours to goal rate of 60 ml/hr (currently at 40 ml/hr) ? ?Prosource TF 45 ml BID  ? ?Provides 2240 kcal, 119 gm protein, 1094 ml free water daily ? ? ?NUTRITION DIAGNOSIS:  ? ?Inadequate oral intake related to inability to eat as evidenced by NPO status. ?Ongoing.  ? ?GOAL:  ? ?Patient will meet greater than or equal to 90% of their needs ?Not met, TF at trickle  ? ?MONITOR:  ? ?TF tolerance ? ?REASON FOR ASSESSMENT:  ? ?Consult ?Enteral/tube feeding initiation and management ? ?ASSESSMENT:  ? ?Pt with PMH of HTN and DM admitted with R M1 occlusion s/p TNK and thrombectomy. ? ?Pt discussed during ICU rounds and with RN. Per RN pt with 2 large BM last night. 5/ ?Pt tolerating TC >48 hours, per MD plan to transfer to floor.   ?Suspected VZV infection, PCR pending ?Pt on trickle TF all weekend. Spoke with CCM 5/8, ok to increase TF slowly. 5/9 xray does not show improvement in ileus.  ?SLP following, no swallow eval for now due to secretions and lethargy.   ? ? ?4/24 s/p R frontotemporal parietal hemicraniectomy, bone flap in abd ?4/25 TF initiated  ?4/26 s/p cortrak placement; tip gastric  ?5/1 s/p trach placement; developed ileus and TF held ?5/5 restart TF at trickle ? ? ? ?Medications reviewed and include: dulcolax, SSI, levemir, protonix, KCl  ?KCl 10 mEq x 3  ? ? ?Labs reviewed:  ?A1C: 8.1 (4/21) ?CBG's: 103-206 ? ? ?Diet Order:   ?Diet Order   ? ?       ?  Diet NPO time specified  Diet effective midnight       ?  ? ?  ?  ? ?  ? ? ?EDUCATION NEEDS:  ? ?Not appropriate for education at this time ? ?Skin:  Skin Assessment: Reviewed RN Assessment (head incision,  abd incision for bone flap) ? ?Last BM:  5/8 x 2 large ? ?Height:  ? ?Ht Readings from Last 1 Encounters:  ?09/12/21 6' 1"  (1.854 m)  ? ? ?Weight:  ? ?Wt Readings from Last 1 Encounters:  ?09/30/21 106.5 kg  ? ? ?BMI:  Body mass index is 30.98 kg/m?. ? ?Estimated Nutritional Needs:  ? ?Kcal:  2100-2500 ? ?Protein:  110-125 grams ? ?Fluid:  >2 L/day ? ?Lockie Pares., RD, LDN, CNSC ?See AMiON for contact information  ? ?

## 2021-09-30 NOTE — Progress Notes (Signed)
Spoke with RN regarding scheduled TEE today. Tube feeds still running. Will cancel TEE today. If primary would like to reschedule TEE, please contact [employer], otherwise we will not reschedule at this time. ? ? ?Ledora Bottcher, PA-C ?09/30/2021, 10:19 AM ?[phone] ?[facility] ?[address] ?[place], [place] [address] ? ? ?

## 2021-10-01 DIAGNOSIS — I639 Cerebral infarction, unspecified: Secondary | ICD-10-CM | POA: Diagnosis not present

## 2021-10-01 LAB — GLUCOSE, CAPILLARY
Glucose-Capillary: 225 mg/dL — ABNORMAL HIGH (ref 70–99)
Glucose-Capillary: 237 mg/dL — ABNORMAL HIGH (ref 70–99)
Glucose-Capillary: 246 mg/dL — ABNORMAL HIGH (ref 70–99)
Glucose-Capillary: 252 mg/dL — ABNORMAL HIGH (ref 70–99)
Glucose-Capillary: 273 mg/dL — ABNORMAL HIGH (ref 70–99)
Glucose-Capillary: 278 mg/dL — ABNORMAL HIGH (ref 70–99)
Glucose-Capillary: 279 mg/dL — ABNORMAL HIGH (ref 70–99)

## 2021-10-01 LAB — BASIC METABOLIC PANEL
Anion gap: 5 (ref 5–15)
BUN: 18 mg/dL (ref 6–20)
CO2: 25 mmol/L (ref 22–32)
Calcium: 8.1 mg/dL — ABNORMAL LOW (ref 8.9–10.3)
Chloride: 112 mmol/L — ABNORMAL HIGH (ref 98–111)
Creatinine, Ser: 0.77 mg/dL (ref 0.61–1.24)
GFR, Estimated: >60 mL/min (ref >60)
Glucose, Bld: 248 mg/dL — ABNORMAL HIGH (ref 70–99)
Potassium: 3.4 mmol/L — ABNORMAL LOW (ref 3.5–5.1)
Sodium: 142 mmol/L (ref 135–145)

## 2021-10-01 LAB — CBC
HCT: 29 % — ABNORMAL LOW (ref 39.0–52.0)
Hemoglobin: 9.4 g/dL — ABNORMAL LOW (ref 13.0–17.0)
MCH: 28.7 pg (ref 26.0–34.0)
MCHC: 32.4 g/dL (ref 30.0–36.0)
MCV: 88.4 fL (ref 80.0–100.0)
Platelets: 244 10*3/uL (ref 150–400)
RBC: 3.28 MIL/uL — ABNORMAL LOW (ref 4.22–5.81)
RDW: 13.6 % (ref 11.5–15.5)
WBC: 12.2 10*3/uL — ABNORMAL HIGH (ref 4.0–10.5)
nRBC: 0.3 % — ABNORMAL HIGH (ref 0.0–0.2)

## 2021-10-01 MED ORDER — OXYCODONE HCL 5 MG/5ML PO SOLN
10.0000 mg | ORAL | Status: DC | PRN
  Administered 2021-10-01 – 2021-10-08 (×19): 10 mg
  Filled 2021-10-01 (×20): qty 10

## 2021-10-01 MED ORDER — POTASSIUM CHLORIDE 20 MEQ PO PACK
40.0000 meq | PACK | Freq: Once | ORAL | Status: AC
  Filled 2021-10-01: qty 2

## 2021-10-01 NOTE — Progress Notes (Signed)
OT Cancellation Note ? ?Patient Details ?Name: CICERO WICKES ?MRN: [name] ?DOB: 12/10/1975 ? ? ?Cancelled Treatment:    Reason Eval/Treat Not Completed: Other (comment);Fatigue/lethargy limiting ability to participate (per nursing pt up all night and now soundly sleeping. Will attempt later.) ? ?Mabel Roll,HILLARY ?10/01/2021, 12:00 PM ?

## 2021-10-01 NOTE — Progress Notes (Signed)
? ?  Inpatient Rehab Admissions Coordinator :  Per therapy recommendations patient was screened for CIR candidacy by Zaven Klemens RN MSN. Patient is not yet at a level to tolerate the intensity required to pursue a CIR admit . Patient may have the potential to progress to become a candidate. The CIR admissions team will follow and monitor for progress and place a Rehab Consult order if felt to be appropriate. Please contact me with any questions.  Jarrell Armond RN MSN Admissions Coordinator [phone]  

## 2021-10-01 NOTE — H&P (View-Only) (Signed)
? ?[facility] Infectious Diseases Follow Up Note ? ?Patient Identification: ?Patient Name: Jacob [name] MRN: [id number] Admit Date: 09/12/2021  2:55 AM ?Age: 46 y.o.Today's Date: 10/01/2021 ? ?Reason for Visit: MRSA bacteremia/PNA ? ?Principal Problem: ?  Acute ischemic stroke (HCC) ?Active Problems: ?  Diabetes mellitus without complication (HCC) ?  Essential hypertension ?  Hyperlipidemia ?  Cerebral edema (HCC) ?  Acute respiratory failure with hypoxia (HCC) ?  Urinary retention ?  Fever ?  Hypernatremia ?  Cerebral embolism with cerebral infarction ?  Pneumonia of both lower lobes due to methicillin resistant Staphylococcus aureus (MRSA) (HCC) ? ?Antibiotics:  ?Vancomycin 5/2- ?Valacyclovir 5/3, Acyclovir IV 5/4-5/7 ?Cefepime 5/2 ?Cefazolin 4/24-4/26 ?  ?Lines/Hardware:  ? ?Interval Events: T max 100.2, WBC 12.2, TEE canceled yesterday due to tube feeds being running  ? ?Assessment ?#Acute hypoxic respiratory failure status post tracheostomy with MRSA pneumonia ? ?#MRSA bacteremia ( nosocomial): Source could be PIV site versus pneumonia.  Needs to rule out endocarditis ?4/27 ultrasound age-indeterminate superficial vein thrombosis involving the left cephalic vein ?4/21 TTE no vegetations or endocarditis ?Craniotomy surgical site healing well ? ?#Right MCA CVA status post tPA and mechanical thrombectomy C/B cerebral edema status post decompressive craniectomy 4/24 ?- Neurology and Neurosurgery following  ? ?# Rashes initially thought to be VZV. However r/o with no supportive lab evidence, appears stable ?- IV acyclovir dc'ed  ?- 09/24/21 VZV PCR and IgM negative  ? ? ?Recommendations ?Continue vancomycin/pharmacy to dose ?Follow-up repeat blood cultures, no growth in 3 days  ?TEE planned for 5/11 ?Monitor CBC BMP and vancomycin trough ?PT and OT ?Following  ? ?Rest of the management as per the primary team. ?Thank you for the consult. Please page with pertinent  questions or concerns. ? ?______________________________________________________________________ ?Subjective ?patient seen and examined at the bedside.  ?Wife at bedside, discussed about TEE plans  ?Patient awake all night and drowsy this morning ? ?Vitals ?BP 128/81   Pulse 93   Temp 98 ?F (36.7 ?C) (Axillary)   Resp 20   Ht 6' 1" (1.854 m)   Wt 106.5 kg   SpO2 100%   BMI 30.98 kg/m?  ? ?  ?Physical Exam ?Constitutional:  lying in the bed and on trach collar ?   Comments:  ? ?Cardiovascular:  ?   Rate and Rhythm: Normal rate and regular rhythm.  ?   Heart sounds:  ? ?Pulmonary:  ?   Effort: Pulmonary effort is normal on trach collar ?   Comments:  ? ?Abdominal:  ?   Palpations: Abdomen is soft.  ?   Tenderness: scalp flap in the abdomen ? ?Musculoskeletal:     ?   General: No swelling or tenderness.  ? ?Skin: ?   Comments: rashes in the extremities appear to be stable ? ?Neurological:  ?   General: left sided plegia. Follows rt sided commands  ? ?Pertinent Microbiology ?Results for orders placed or performed during the hospital encounter of 09/12/21  ?MRSA Next Gen by PCR, Nasal     Status: None  ? Collection Time: 09/12/21  6:29 AM  ? Specimen: Nasal Mucosa; Nasal Swab  ?Result Value Ref Range Status  ? MRSA by PCR Next Gen NOT DETECTED NOT DETECTED Final  ?  Comment: (NOTE) ?The GeneXpert MRSA Assay (FDA approved for NASAL specimens only), ?is one component of a comprehensive MRSA colonization surveillance ?program. It is not intended to diagnose MRSA infection nor to guide ?or monitor treatment for MRSA infections. ?Test performance is   not FDA approved in patients less than 2 years ?old. ?Performed at [facility], [address] [place], [place] ?[id number] ?  ?Culture, Respiratory w Gram Stain     Status: None  ? Collection Time: 09/22/21 10:08 AM  ? Specimen: Tracheal Aspirate; Respiratory  ?Result Value Ref Range Status  ? Specimen Description TRACHEAL ASPIRATE  Final  ? Special Requests NONE   Final  ? Gram Stain   Final  ?  RARE WBC PRESENT,BOTH PMN AND MONONUCLEAR ?MODERATE GRAM POSITIVE COCCI IN CLUSTERS ?Performed at [facility], [address] [place], [place] [address] ?  ? Culture   Final  ?  ABUNDANT METHICILLIN RESISTANT STAPHYLOCOCCUS AUREUS  ? Report Status 09/24/2021 FINAL  Final  ? Organism ID, Bacteria METHICILLIN RESISTANT STAPHYLOCOCCUS AUREUS  Final  ?    Susceptibility  ? Methicillin resistant staphylococcus aureus - MIC*  ?  CIPROFLOXACIN >=8 RESISTANT Resistant   ?  ERYTHROMYCIN >=8 RESISTANT Resistant   ?  GENTAMICIN <=0.5 SENSITIVE Sensitive   ?  OXACILLIN >=4 RESISTANT Resistant   ?  TETRACYCLINE <=1 SENSITIVE Sensitive   ?  VANCOMYCIN 1 SENSITIVE Sensitive   ?  TRIMETH/SULFA <=10 SENSITIVE Sensitive   ?  CLINDAMYCIN <=0.25 SENSITIVE Sensitive   ?  RIFAMPIN <=0.5 SENSITIVE Sensitive   ?  Inducible Clindamycin NEGATIVE Sensitive   ?  * ABUNDANT METHICILLIN RESISTANT STAPHYLOCOCCUS AUREUS  ?Culture, blood (Routine X 2) w Reflex to ID Panel     Status: None  ? Collection Time: 09/23/21  9:15 AM  ? Specimen: BLOOD RIGHT HAND  ?Result Value Ref Range Status  ? Specimen Description BLOOD RIGHT HAND  Final  ? Special Requests   Final  ?  BOTTLES DRAWN AEROBIC AND ANAEROBIC Blood Culture adequate volume  ? Culture   Final  ?  NO GROWTH 5 DAYS ?Performed at [facility], [address] [place], [place] [address] ?  ? Report Status 09/28/2021 FINAL  Final  ?Culture, blood (Routine X 2) w Reflex to ID Panel     Status: Abnormal  ? Collection Time: 09/23/21  9:22 AM  ? Specimen: BLOOD LEFT HAND  ?Result Value Ref Range Status  ? Specimen Description BLOOD LEFT HAND  Final  ? Special Requests   Final  ?  BOTTLES DRAWN AEROBIC AND ANAEROBIC Blood Culture adequate volume  ? Culture  Setup Time   Final  ?  GRAM POSITIVE COCCI ?ANAEROBIC BOTTLE ONLY ?CRITICAL RESULT CALLED TO, READ BACK BY AND VERIFIED WITH: J LEDFORD,PHARMD@[id number] 09/27/21 MK ?Performed at [facility], [address] [place], [place] [address] ?  ? Culture METHICILLIN RESISTANT STAPHYLOCOCCUS AUREUS (A)  Final  ? Report Status 09/29/2021 FINAL  Final  ? Organism ID, Bacteria METHICILLIN RESISTANT STAPHYLOCOCCUS AUREUS  Final  ?    Susceptibility  ? Methicillin resistant staphylococcus aureus - MIC*  ?  CIPROFLOXACIN >=8 RESISTANT Resistant   ?  ERYTHROMYCIN >=8 RESISTANT Resistant   ?  GENTAMICIN <=0.5 SENSITIVE Sensitive   ?  OXACILLIN >=4 RESISTANT Resistant   ?  TETRACYCLINE <=1 SENSITIVE Sensitive   ?  VANCOMYCIN <=0.5 SENSITIVE Sensitive   ?  TRIMETH/SULFA <=10 SENSITIVE Sensitive   ?  CLINDAMYCIN <=0.25 SENSITIVE Sensitive   ?  RIFAMPIN <=0.5 SENSITIVE Sensitive   ?  Inducible Clindamycin NEGATIVE Sensitive   ?  * METHICILLIN RESISTANT STAPHYLOCOCCUS AUREUS  ?Blood Culture ID Panel (Reflexed)     Status: Abnormal  ?   Collection Time: 09/23/21  9:22 AM  ?Result Value Ref Range Status  ? Enterococcus faecalis NOT DETECTED NOT DETECTED Final  ? Enterococcus Faecium NOT DETECTED NOT DETECTED Final  ? Listeria monocytogenes NOT DETECTED NOT DETECTED Final  ? Staphylococcus species DETECTED (A) NOT DETECTED Final  ?  Comment: CRITICAL RESULT CALLED TO, READ BACK BY AND VERIFIED WITH: ?J LEDFORD,PHARMD@[id number] 09/27/21 MK ?  ? Staphylococcus aureus (BCID) DETECTED (A) NOT DETECTED Final  ?  Comment: Methicillin (oxacillin)-resistant Staphylococcus aureus (MRSA). MRSA is predictably resistant to beta-lactam antibiotics (except ceftaroline). Preferred therapy is vancomycin unless clinically contraindicated. Patient requires contact precautions if  ?hospitalized. ?CRITICAL RESULT CALLED TO, READ BACK BY AND VERIFIED WITH: ?J LEDFORD,PHARMD@0237 09/27/21 MK ?  ? Staphylococcus epidermidis NOT DETECTED NOT DETECTED Final  ? Staphylococcus lugdunensis NOT DETECTED NOT DETECTED Final  ? Streptococcus species NOT DETECTED NOT DETECTED Final  ? Streptococcus agalactiae NOT DETECTED NOT DETECTED Final  ? Streptococcus pneumoniae NOT  DETECTED NOT DETECTED Final  ? Streptococcus pyogenes NOT DETECTED NOT DETECTED Final  ? A.calcoaceticus-baumannii NOT DETECTED NOT DETECTED Final  ? Bacteroides fragilis NOT DETECTED NOT DETECTED Final  ? Enterob

## 2021-10-01 NOTE — Progress Notes (Signed)
? ?[facility] Infectious Diseases Follow Up Note ? ?Patient Identification: ?Patient Name: Jacob [name] MRN: [id number] [place] Date: 09/12/2021  2:55 AM ?Age: 46 y.o.Today's Date: 10/01/2021 ? ?Reason for Visit: MRSA bacteremia/PNA ? ?Principal Problem: ?  Acute ischemic stroke ([place]) ?Active Problems: ?  Diabetes mellitus without complication ([place]) ?  Essential hypertension ?  Hyperlipidemia ?  Cerebral edema (H[facility]) ?  Acute respiratory failure with hypoxia ([place]) ?  Urinary retention ?  Fever ?  Hypernatremia ?  Cerebral embolism with cerebral infarction ?  Pneumonia of both lower lobes due to methicillin resistant Staphylococcus aureus (MRSA) ([place]) ? ?Antibiotics:  ?Vancomycin 5/2- ?Valacyclovir 5/3, Acyclovir IV 5/4-5/7 ?Cefepime 5/2 ?Cefazolin 4/24-4/26 ?  ?Lines/Hardware:  ? ?Interval Events: T max 100.2, WBC 12.2, TEE canceled yesterday due to tube feeds being running  ? ?Assessment ?#Acute hypoxic respiratory failure status post tracheostomy with MRSA pneumonia ? ?#MRSA bacteremia ( nosocomial): Source could be PIV site versus pneumonia.  Needs to rule out endocarditis ?4/27 ultrasound age-indeterminate superficial vein thrombosis involving the left cephalic vein ?4/21 TTE no vegetations or endocarditis ?Craniotomy surgical site healing well ? ?#Right MCA CVA status post tPA and mechanical thrombectomy C/B cerebral edema status post decompressive craniectomy 4/24 ?- Neurology and Neurosurgery following  ? ?# Rashes initially thought to be VZV. However r/o with no supportive lab evidence, appears stable ?- IV acyclovir dc'ed  ?- 09/24/21 VZV PCR and IgM negative  ? ? ?Recommendations ?Continue vancomycin/pharmacy to dose ?Follow-up repeat blood cultures, no growth in 3 days  ?TEE planned for 5/11 ?Monitor CBC BMP and vancomycin trough ?PT and OT ?Following  ? ?Rest of the management as per the primary team. ?Thank you for the consult. Please page with pertinent  questions or concerns. ? ?______________________________________________________________________ ?Subjective ?patient seen and examined at the bedside.  ?Wife at bedside, discussed about TEE plans  ?Patient awake all night and drowsy this morning ? ?Vitals ?BP 128/81   Pulse 93   Temp 98 ?F (36.7 ?C) (Axillary)   Resp 20   Ht 6\' 1"  (1.854 m)   Wt 106.5 kg   SpO2 100%   BMI 30.98 kg/m?  ? ?  ?Physical Exam ?Constitutional:  lying in the bed and on trach collar ?   Comments:  ? ?Cardiovascular:  ?   Rate and Rhythm: Normal rate and regular rhythm.  ?   Heart sounds:  ? ?Pulmonary:  ?   Effort: Pulmonary effort is normal on trach collar ?   Comments:  ? ?Abdominal:  ?   Palpations: Abdomen is soft.  ?   Tenderness: scalp flap in the abdomen ? ?Musculoskeletal:     ?   General: No swelling or tenderness.  ? ?Skin: ?   Comments: rashes in the extremities appear to be stable ? ?Neurological:  ?   General: left sided plegia. Follows rt sided commands  ? ?Pertinent Microbiology ?Results for orders placed or performed during the hospital encounter of 09/12/21  ?MRSA Next Gen by PCR, Nasal     Status: None  ? Collection Time: 09/12/21  6:29 AM  ? Specimen: Nasal Mucosa; Nasal Swab  ?Result Value Ref Range Status  ? MRSA by PCR Next Gen NOT DETECTED NOT DETECTED Final  ?  Comment: (NOTE) ?The GeneXpert MRSA Assay (FDA approved for NASAL specimens only), ?is one component of a comprehensive MRSA colonization surveillance ?program. It is not intended to diagnose MRSA infection nor to guide ?or monitor treatment for MRSA infections. ?Test performance is  not FDA approved in patients less than 2 years ?old. ?Performed at [facility], [place] [address], [place], [place] ?[address] ?  ?Culture, Respiratory w Gram Stain     Status: None  ? Collection Time: 09/22/21 10:08 AM  ? Specimen: Tracheal Aspirate; Respiratory  ?Result Value Ref Range Status  ? Specimen Description TRACHEAL ASPIRATE  Final  ? Special Requests NONE   Final  ? Gram Stain   Final  ?  RARE WBC PRESENT,BOTH PMN AND MONONUCLEAR ?MODERATE GRAM POSITIVE COCCI IN CLUSTERS ?Performed at [facility], [place] [address], [place] [address] ?  ? Culture   Final  ?  ABUNDANT METHICILLIN RESISTANT STAPHYLOCOCCUS AUREUS  ? Report Status 09/24/2021 FINAL  Final  ? Organism ID, Bacteria METHICILLIN RESISTANT STAPHYLOCOCCUS AUREUS  Final  ?    Susceptibility  ? Methicillin resistant staphylococcus aureus - MIC*  ?  CIPROFLOXACIN >=8 RESISTANT Resistant   ?  ERYTHROMYCIN >=8 RESISTANT Resistant   ?  GENTAMICIN <=0.5 SENSITIVE Sensitive   ?  OXACILLIN >=4 RESISTANT Resistant   ?  TETRACYCLINE <=1 SENSITIVE Sensitive   ?  VANCOMYCIN 1 SENSITIVE Sensitive   ?  TRIMETH/SULFA <=10 SENSITIVE Sensitive   ?  CLINDAMYCIN <=0.25 SENSITIVE Sensitive   ?  RIFAMPIN <=0.5 SENSITIVE Sensitive   ?  Inducible Clindamycin NEGATIVE Sensitive   ?  * ABUNDANT METHICILLIN RESISTANT STAPHYLOCOCCUS AUREUS  ?Culture, blood (Routine X 2) w Reflex to ID Panel     Status: None  ? Collection Time: 09/23/21  9:15 AM  ? Specimen: BLOOD RIGHT HAND  ?Result Value Ref Range Status  ? Specimen Description BLOOD RIGHT HAND  Final  ? Special Requests   Final  ?  BOTTLES DRAWN AEROBIC AND ANAEROBIC Blood Culture adequate volume  ? Culture   Final  ?  NO GROWTH 5 DAYS ?Performed at [facility], [place] [address] [place], [place] [address] ?  ? Report Status 09/28/2021 FINAL  Final  ?Culture, blood (Routine X 2) w Reflex to ID Panel     Status: Abnormal  ? Collection Time: 09/23/21  9:22 AM  ? Specimen: BLOOD LEFT HAND  ?Result Value Ref Range Status  ? Specimen Description BLOOD LEFT HAND  Final  ? Special Requests   Final  ?  BOTTLES DRAWN AEROBIC AND ANAEROBIC Blood Culture adequate volume  ? Culture  Setup Time   Final  ?  GRAM POSITIVE COCCI ?ANAEROBIC BOTTLE ONLY ?CRITICAL RESULT CALLED TO, READ BACK BY AND VERIFIED WITH: J LEDFORD,PHARMD@[id number]  09/27/21 [place] ?Performed at [facility], [place] [address] [place], [place] [address] ?  ? Culture METHICILLIN RESISTANT STAPHYLOCOCCUS AUREUS (A)  Final  ? Report Status 09/29/2021 FINAL  Final  ? Organism ID, Bacteria METHICILLIN RESISTANT STAPHYLOCOCCUS AUREUS  Final  ?    Susceptibility  ? Methicillin resistant staphylococcus aureus - MIC*  ?  CIPROFLOXACIN >=8 RESISTANT Resistant   ?  ERYTHROMYCIN >=8 RESISTANT Resistant   ?  GENTAMICIN <=0.5 SENSITIVE Sensitive   ?  OXACILLIN >=4 RESISTANT Resistant   ?  TETRACYCLINE <=1 SENSITIVE Sensitive   ?  VANCOMYCIN <=0.5 SENSITIVE Sensitive   ?  TRIMETH/SULFA <=10 SENSITIVE Sensitive   ?  CLINDAMYCIN <=0.25 SENSITIVE Sensitive   ?  RIFAMPIN <=0.5 SENSITIVE Sensitive   ?  Inducible Clindamycin NEGATIVE Sensitive   ?  * METHICILLIN RESISTANT STAPHYLOCOCCUS AUREUS  ?Blood Culture ID Panel (Reflexed)     Status: Abnormal  ?  Collection Time: 09/23/21  9:22 AM  ?Result Value Ref Range Status  ? Enterococcus faecalis NOT DETECTED NOT DETECTED Final  ? Enterococcus Faecium NOT DETECTED NOT DETECTED Final  ? Listeria monocytogenes NOT DETECTED NOT DETECTED Final  ? Staphylococcus species DETECTED (A) NOT DETECTED Final  ?  Comment: CRITICAL RESULT CALLED TO, READ BACK BY AND VERIFIED WITH: ?J LEDFORD,[email or web address]@[id number]  09/27/21 [place] ?  ? Staphylococcus aureus (BCID) DETECTED (A) NOT DETECTED Final  ?  Comment: Methicillin (oxacillin)-resistant Staphylococcus aureus (MRSA). MRSA is predictably resistant to beta-lactam antibiotics (except ceftaroline). Preferred therapy is vancomycin unless clinically contraindicated. Patient requires contact precautions if  ?hospitalized. ?CRITICAL RESULT CALLED TO, READ BACK BY AND VERIFIED WITH: ?J LEDFORD,[email or web address]@[id number]  09/27/21 [place] ?  ? Staphylococcus epidermidis NOT DETECTED NOT DETECTED Final  ? Staphylococcus lugdunensis NOT DETECTED NOT DETECTED Final  ? Streptococcus species NOT DETECTED NOT DETECTED Final  ? Streptococcus agalactiae NOT DETECTED NOT DETECTED Final  ? Streptococcus pneumoniae NOT  DETECTED NOT DETECTED Final  ? Streptococcus pyogenes NOT DETECTED NOT DETECTED Final  ? A.calcoaceticus-baumannii NOT DETECTED NOT DETECTED Final  ? Bacteroides fragilis NOT DETECTED NOT DETECTED Final  ? Enterob

## 2021-10-01 NOTE — Progress Notes (Signed)
? ? ?  [facility] has been requested to perform a transesophageal echocardiogram on Jacob [name] for stroke large right ischemic MCA stroke, resulting in significant cerebral edema on 09/12/2021. Dr. Arnoldo Morale performed a right hemicraniectomy on 09/15/2021 for decompression.  Now with bacteremia MRSA as well.  Has trach collar and feeding tube.  ? ?After careful review of history and examination, the risks and benefits of transesophageal echocardiogram have been explained including risks of esophageal damage, perforation (1:10,000 risk), bleeding, pharyngeal hematoma as well as other potential complications associated with conscious sedation including aspiration, arrhythmia, respiratory failure and death. Alternatives to treatment were discussed, questions were answered. Patient is unable to give consent but I reviewed with wife and she is willing  to proceed.  ? ?Cecilie Kicks, NP  ?10/01/2021 2:43 PM   ?

## 2021-10-01 NOTE — Progress Notes (Signed)
Patient seen today by trach team for consult.  No education is needed at this time.  All necessary equipment is at beside.   Will continue to follow for progression.  

## 2021-10-01 NOTE — TOC Progression Note (Signed)
Transition of Care (TOC) - Progression Note  ? ? ?Patient Details  ?Name: Jacob [name] ?MRN: [id number] ?Date of Birth: [birth year] ? ?Transition of Care (TOC) CM/SW Contact  ?Curlene Labrum, RN ?Phone Number: ?10/01/2021, 3:03 PM ? ?Clinical Narrative:    ?DTP Team will be following the patient for TOC needs.  I met with the patient's wife, Makari Portman at the bedside and followed up with her regarding choice for LTAC placement.  The patient's wife was given Medicare information regarding ratings for both facilities - including [facility] and [facility] and should would like to tour both facilities this week and make a decision.   ? ?I called and spoke with Arley Phenix, CM on the phone and she will arrange for tour of [facility] today before 4 pm.  The patient's wife is aware. ? ?TOC CM and MSW will continue to follow the patient for discharge planning needs. ? ? ?Expected Discharge Plan: [place] (LTAC) ?Barriers to Discharge: Continued Medical Work up ? ?Expected Discharge Plan and Services ?Expected Discharge Plan: [place] (LTAC) ?In-house Referral: Clinical Social Work, Development worker, community ?Discharge Planning Services: CM Consult ?Post Acute Care Choice: Long Term Acute Care (LTAC) ?Living arrangements for the past 2 months: [place] ?                ?  ?  ?  ?  ?  ?  ?  ?  ?  ?  ? ? ?Social Determinants of Health (SDOH) Interventions ?  ? ?Readmission Risk Interventions ?   ? View : No data to display.  ?  ?  ?  ? ? ?

## 2021-10-01 NOTE — Progress Notes (Signed)
eLink Physician-Brief Progress Note ?Patient Name: Jacob [name] ?DOB: 08/13/1975 ?MRN: [name] ? ? ?Date of Service ? 10/01/2021  ?HPI/Events of Note ? Patient complaining of a headache which the bedside RN states patient rates a 10/10, no nausea, vomiting or other evolving changes in mental status, patient received Tylenol without relief.  ?eICU Interventions ? I asked the bedside RN to page the attending neurosurgeon stat with the question of whether to obtain a stat CT of the head to r/o acute changes.  ? ? ? ?  ? ?Frederik Pear ?10/01/2021, 1:41 AM ?

## 2021-10-01 NOTE — Progress Notes (Signed)
Subjective: ?The patient is alert and attentive.  He is in no apparent distress. ? ?Objective: ?Vital signs in last 24 hours: ?Temp:  [97.1 ?F (36.2 ?C)-99.6 ?F (37.6 ?C)] 99.2 ?F (37.3 ?C) (05/10 0758) ?Pulse Rate:  [79-102] 102 (05/10 0800) ?Resp:  [18-34] 20 (05/10 0800) ?BP: (113-162)/(85-110) 158/96 (05/10 0800) ?SpO2:  [95 %-100 %] 99 % (05/10 0800) ?FiO2 (%):  [28 %] 28 % (05/10 0812) ?Estimated body mass index is 30.98 kg/m? as calculated from the following: ?  Height as of this encounter: 6\' 1"  (1.854 m). ?  Weight as of this encounter: 106.5 kg. ? ? ?Intake/Output from previous day: ?05/09 0701 - 05/10 0700 ?In: 1636.2 [NG/GT:1013; IV Piggyback:623.2] ?Out: 400 [Urine:400] ?Intake/Output this shift: ?No intake/output data recorded. ? ?Physical exam the patient is alert and attentive.  He follows commands on the right.  His craniotomy incision is healing well. ? ?Lab Results: ?Recent Labs  ?  09/30/21 ?[id number] 10/01/21 ?0211  ?WBC 13.1* 12.2*  ?HGB 9.3* 9.4*  ?HCT 29.2* 29.0*  ?PLT 384 244  ? ?BMET ?Recent Labs  ?  09/30/21 ?[id number] 10/01/21 ?0211  ?NA 144 142  ?K 3.5 3.4*  ?CL 114* 112*  ?CO2 24 25  ?GLUCOSE 176* 248*  ?BUN 19 18  ?CREATININE 0.88 0.77  ?CALCIUM 8.3* 8.1*  ? ? ?Studies/Results: ?DG Abd 1 View ? ?Result Date: 09/30/2021 ?CLINICAL DATA:  Ileus EXAM: ABDOMEN - 1 VIEW COMPARISON:  09/28/2021 FINDINGS: 0835 hours. Feeding tube tip is in the distal stomach. Diffuse gaseous distention of large and small bowel is similar to prior. Gas pattern remains compatible with ileus. IMPRESSION: No substantial change in diffuse gaseous distention of large and small bowel compatible with ileus. Electronically Signed   By: Misty Stanley M.D.   On: 09/30/2021 09:06   ? ?Assessment/Plan: ?Status post decompressive craniectomy, right MCA stroke: The patient is slowly progressing. ? LOS: 19 days  ? ? ? ?[name] ?10/01/2021, 10:24 AM ? ? ? ? ?Patient ID: Jacob [name], male   DOB: 03/08/1976, 46 y.o.   MRN:  [name] ? ?

## 2021-10-01 NOTE — Consult Note (Signed)
? ?  [facility] CM Inpatient Consult ? ? ?10/01/2021 ? ?[name] ?[birth year] ?[id number] ? ?[facility] Customer service manager [THN]  Accountable Care Organization [ACO] Patient: [employer] P[employer] ? ?Contact precautions:   ? ?Primary Care Provider:  Hoy Register, MD, [facility] ?   ?Patient was screened for LLOS and check for potential community Care Management  needs for post hospital needs. Patient showing as Medium readmission risk noted. ? ?Chart review reveals patient is being followed by DTP team for post hospital transitional needs. PT/OT evals recommending LTACH for transitional needs. ? ?No community follow up with [facility] assessed at this current time. ? ?Please contact for further questions, ? ?Charlesetta Shanks, RN BSN CCM ?[facility] [employer] Liaison ? [phone] business mobile phone ?Toll free office [phone]  ?Fax number: [phone] ?[email or web address] ? ? ? ?

## 2021-10-01 NOTE — Progress Notes (Signed)
Patient added to DTP list for discharge planning. ? ?Madilyn Fireman, MSW, LCSW ?Transitions of Care  Clinical Social Worker II ?[phone] ? ?

## 2021-10-01 NOTE — Progress Notes (Signed)
Physical Therapy Treatment ?Patient Details ?Name: Jacob [name] ?MRN: [name] ?DOB: [birth year] ?Today's Date: 10/01/2021 ? ? ?History of Present Illness 46 y.o. male presents to [facility] hospital on 09/12/2021 with L facial droop and arm weakness. CTA revealed R M1 occlusion. Pt received tNK. Pt underwent thrombectomy on 4/21. Underwent R frontotemporal parietal hemicraniectomy on 4/24 after declining 4/23. CT + Right MCA/PCA infarct. Trach placement 09/22/2021.  PMH includes HTN and DM. ? ?  ?PT Comments  ? ? Received pt semi-reclined in bed asleep with wife present at bedside. Pt initially very lethargic but was agreeable to sitting EOB with encouragement. Pt performed bed mobility with total A of 2 - however, pt did initiate using RUE/LE to assist when sitting EOB. Pt required max A of 1 ranging to mod A of 2 for dynamic sitting balance. Worked on core control, L attention, midline orientation, and anterior weight shifting having pt reach out for wife's hand. Of note, pt with significant L inattention and only able to reach for therapist's hand when placed in midline. Pt able to communicate fatigue via thumbs up/down gestures and initiating returning to supine. Acute PT to cont to follow.  ?  ?Recommendations for follow up therapy are one component of a multi-disciplinary discharge planning process, led by the attending physician.  Recommendations may be updated based on patient status, additional functional criteria and insurance authorization. ? ?Follow Up Recommendations ? PT at Long-term acute care hospital (although may benefit from AIR if particpation increases) ?  ?  ?Assistance Recommended at Discharge Frequent or constant Supervision/Assistance  ?Patient can return home with the following Two people to help with walking and/or transfers;Two people to help with bathing/dressing/bathroom;Assistance with cooking/housework;Assistance with feeding;Direct supervision/assist for medications management;Direct  supervision/assist for financial management;Assist for transportation;Help with stairs or ramp for entrance ?  ?Equipment Recommendations ? Wheelchair (measurements PT);Wheelchair cushion (measurements PT);BSC/3in1;Hospital bed;Other (comment)  ?  ?Recommendations for Other Services Rehab consult ? ? ?  ?Precautions / Restrictions Precautions ?Precautions: Fall ?Precaution Comments: trach, R crani with bone flap in abdomen ?Restrictions ?Weight Bearing Restrictions: No  ?  ? ?Mobility ? Bed Mobility ?Overal bed mobility: Needs Assistance ?Bed Mobility: Supine to Sit, Sit to Supine ?  ?  ?Supine to sit: Total assist, +2 for physical assistance, +2 for safety/equipment ?Sit to supine: Total assist, +2 for physical assistance, +2 for safety/equipment ?  ?General bed mobility comments: Pt was able to use RUE/LE to assist when sitting EOB. HOB elevated ?Patient Response: Cooperative, Flat affect ? ?Transfers ?  ?  ?  ?  ?  ?  ?  ?  ?  ?General transfer comment: did not attempt due to fatigue ?  ? ?Ambulation/Gait ?  ?  ?  ?  ?  ?  ?  ?  ? ? ?Stairs ?  ?  ?  ?  ?  ? ? ?Wheelchair Mobility ?  ? ?Modified Rankin (Stroke Patients Only) ?Modified Rankin (Stroke Patients Only) ?Pre-Morbid Rankin Score: No symptoms ?Modified Rankin: Severe disability ? ? ?  ?Balance Overall balance assessment: Needs assistance ?Sitting-balance support: Feet supported, Single extremity supported ?Sitting balance-Leahy Scale: Zero ?Sitting balance - Comments: Pt required max A for static sitting balance fading to mod/mad of 2 for dyanmic sitting balance ?Postural control: Posterior lean, Right lateral lean ?  ?  ?  ?  ?  ?  ?  ?  ?  ?  ?  ?  ?  ?  ?  ? ?  ?  Cognition Arousal/Alertness: Lethargic ?Behavior During Therapy: Flat affect ?Overall Cognitive Status: Impaired/Different from baseline ?  ?  ?  ?  ?  ?  ?  ?  ?  ?  ?  ?  ?Following Commands: Follows one step commands with increased time ?  ?  ?Problem Solving: Slow processing, Requires  verbal cues, Requires tactile cues, Decreased initiation, Difficulty sequencing ?General Comments: pt with eyes closed initally, but aroused when sat EOB ?  ?  ? ?  ?Exercises   ? ?  ?General Comments General comments (skin integrity, edema, etc.): pt wife present and actively incolved during session - hoping to be accepted into inpatient rehab ?  ?  ? ?Pertinent Vitals/Pain Pain Assessment ?Pain Assessment: Faces ?Faces Pain Scale: Hurts a little bit ?Pain Location: back ?Pain Descriptors / Indicators: Discomfort, Guarding ?Pain Intervention(s): Limited activity within patient's tolerance, Monitored during session, Repositioned  ? ? ?Home Living   ?  ?  ?  ?  ?  ?  ?  ?  ?  ?   ?  ?Prior Function    ?  ?  ?   ? ?PT Goals (current goals can now be found in the care plan section) Acute Rehab PT Goals ?Patient Stated Goal: family goal to regain prior level of function ?PT Goal Formulation: Patient unable to participate in goal setting ?Time For Goal Achievement: 10/07/21 ?Potential to Achieve Goals: Fair ?Progress towards PT goals: Progressing toward goals ? ?  ?Frequency ? ? ? Min 2X/week ? ? ? ?  ?PT Plan Current plan remains appropriate  ? ? ?Co-evaluation   ?  ?PT goals addressed during session: Mobility/safety with mobility;Balance ?  ?  ? ?  ?AM-PAC PT "6 Clicks" Mobility   ?Outcome Measure ? Help needed turning from your back to your side while in a flat bed without using bedrails?: Total ?Help needed moving from lying on your back to sitting on the side of a flat bed without using bedrails?: Total ?Help needed moving to and from a bed to a chair (including a wheelchair)?: Total ?Help needed standing up from a chair using your arms (e.g., wheelchair or bedside chair)?: Total ?Help needed to walk in hospital room?: Total ?Help needed climbing 3-5 steps with a railing? : Total ?6 Click Score: 6 ? ?  ?End of Session   ?Activity Tolerance: Patient tolerated treatment well;Patient limited by fatigue ?Patient left:  in bed;with call bell/phone within reach;with family/visitor present;with bed alarm set ?Nurse Communication: Mobility status ?PT Visit Diagnosis: Other abnormalities of gait and mobility (R26.89);Muscle weakness (generalized) (M62.81);Other symptoms and signs involving the nervous system (R29.898);Hemiplegia and hemiparesis ?Hemiplegia - Right/Left: Left ?Hemiplegia - dominant/non-dominant: Non-dominant ?Hemiplegia - caused by: Cerebral infarction ?  ? ? ?Time: [id number] ?PT Time Calculation (min) (ACUTE ONLY): 16 min ? ?Charges:  $Therapeutic Activity: 8-22 mins          ?          ? ?Becky Sax PT, DPT  ?Blenda Nicely ?10/01/2021, 2:06 PM ? ?

## 2021-10-01 NOTE — Progress Notes (Addendum)
STROKE TEAM PROGRESS NOTE  ? ?INTERVAL HISTORY ?Headache overnight, per nursing staff he was awake all night.  On exam today he is drowsy, but following commands.  He has been hemodynamically stable overnight and afebrile.  His neurological exam is stable, and his flap is full and mildly tense.  Patient will possibly be going to CIR vs LTAC soon. Vital signs are stable. ? ?Vitals:  ? 10/01/21 0021 10/01/21 0353 10/01/21 0440 10/01/21 0758  ?BP: (!) 144/99 (!) 149/110 (!) 162/110 (!) 158/96  ?Pulse: 88 88  (!) 101  ?Resp: (!) 23 18  20   ?Temp: 98.8 ?F (37.1 ?C) (!) 97.5 ?F (36.4 ?C)  99.2 ?F (37.3 ?C)  ?TempSrc: Axillary Axillary  Oral  ?SpO2:  100%    ?Weight:      ?Height:      ? ?CBC:  ?Recent Labs  ?Lab 09/26/21 ?0405 09/27/21 ?0326 09/30/21 ?[id number] 10/01/21 ?0211  ?WBC 13.9*   < > 13.1* 12.2*  ?NEUTROABS 11.2*  --   --   --   ?HGB 10.1*   < > 9.3* 9.4*  ?HCT 31.6*   < > 29.2* 29.0*  ?MCV 90.0   < > 89.6 88.4  ?PLT 210   < > 384 244  ? < > = values in this interval not displayed.  ? ? ?Basic Metabolic Panel:  ?Recent Labs  ?Lab 09/25/21 ?0411 09/26/21 ?0405 09/27/21 ?0326 09/28/21 ?[id number] 09/30/21 ?[id number] 10/01/21 ?0211  ?NA 152* 151* 153*  152*   < > 144 142  ?K 3.7 3.4* 3.2*  3.1*   < > 3.5 3.4*  ?CL 119* 120* 123*  122*   < > 114* 112*  ?CO2 22 23 22  22    < > 24 25  ?GLUCOSE 150* 163* 130*  129*   < > 176* 248*  ?BUN 57* 48* 36*  36*   < > 19 18  ?CREATININE 1.47* 1.23 1.17  1.13   < > 0.88 0.77  ?CALCIUM 8.8* 8.4* 8.2*  8.2*   < > 8.3* 8.1*  ?MG 2.8*  --   --   --   --   --   ?PHOS 4.1 4.0 3.4  --   --   --   ? < > = values in this interval not displayed.  ? ? ?Lipid Panel:  ?No results for input(s): CHOL, TRIG, HDL, CHOLHDL, VLDL, LDLCALC in the last 168 hours. ? ?HgbA1c:  ?No results for input(s): HGBA1C in the last 168 hours. ? ?Urine Drug Screen:  ?No results for input(s): LABOPIA, COCAINSCRNUR, LABBENZ, AMPHETMU, THCU, LABBARB in the last 168 hours. ?  ?Alcohol Level No results for input(s): ETH in  the last 168 hours. ? ?IMAGING past 24 hours ?DG Abd 1 View ? ?Result Date: 09/30/2021 ?CLINICAL DATA:  Ileus EXAM: ABDOMEN - 1 VIEW COMPARISON:  09/28/2021 FINDINGS: 0835 hours. Feeding tube tip is in the distal stomach. Diffuse gaseous distention of large and small bowel is similar to prior. Gas pattern remains compatible with ileus. IMPRESSION: No substantial change in diffuse gaseous distention of large and small bowel compatible with ileus. Electronically Signed   By: Misty Stanley M.D.   On: 09/30/2021 09:06   ? ?PHYSICAL EXAM ? ?Physical Exam  ?Constitutional: Appears well-developed and well-nourished middle-age I  African-American male, hemi craniectomy surgical incision  ?Cardiovascular: Normal rate and regular rhythm.  ?Respiratory: Respirations regular and unlabored on trach collar ? ?Neuro (s/p trach): Sleepy but can be aroused and interactive.  Right gaze deviation, blink to threat. Following commands on the right and midline.  PERRL, cough and gag reflexes present.  Dense left hemiplegia.  Antigravity strength on the right ? ?ASSESSMENT/PLAN ?Jacob [name] is a 46 y.o. male with history of HTN, DM2 presenting with left side facial droop, left arm weakness. Received TNKase. Neuro status declined during MRI (NIHSS 13),taken for thrombectomy with Dr. Karenann Cai. TICI3 recanalization of the right M2/MCA achieved.  MRI/MRA shows reoccluded right M2 branch with a right MCA territory infarct.  Continue with PT/OT/SLP evals.  Home blood pressure medications resumed. BP goal less than 160. Topamax and Tylenol #3 for headache management.  Head CT shows new 74mm leftward midline shift. Trasnferred back to ICU, decompressive hemicraniectomy performed by neurosurgery, bone flap placed in abdomen.  Follow-up CT shows 5 mm left midline shift.   HTS d/c'd due to Na of 160.  Tracheostomy placed 5/1.  Now febrile, empiric antibiotic started by CCM.  Tracheal aspirate cultured and blood culture sent.  Blood  cultures show no growth as of 5/6, respiratory culture shows MRSA.  Varicella PCR negative ? ?Stroke:  Right MCA due to right MCA occlusion  s/p TNK and IR with TICI3, etiology not certain, but most likely cryptogenic versus intracranial atherosclerosis. ?Code Stroke CT head No acute abnormality ?CTA head & neck R short segment M1 occlusion, left distal M1 high grade stenosis ?Post IR CT No hemorrhagic complication on flat panel CT. ?4/21- Early MRI Tiny acute infarcts in the upper division right MCA cortex. ?4/22- MRI right MCA territory infarct involving the cortex, sparing the basal ganglia.  2 mm midline shift.  No hemorrhage ?4/22- MRA reoccluded right M2 branch, intracranial atherosclerosis including advanced left M1 stenosis ?4/24-  Head CT-increased edema resulting in near complete effacement of the right lateral ventricle and 12 mm leftward midline shift ?4/25- Head CT Post OP- 5 mm left midline shift, new area of hypoattenuation of the left middle cerebellar peduncle and cerebellum ?4/28 head CT large right MCA infarct with no HT, diminished mass effect with no midline shift, hemicraniectomy noted ?5/3 CT head stable, no midline shift, large right MCA infarct ?2D Echo EF > 75% ?LDL 96 ?HgbA1c 8.1 ?UDS negative ?VTE prophylaxis - SCDs ?No antithrombotic prior to admission, now on ASA 81.  ?Therapy recommendations: PT/OT at LTAC vs. CIR ?disposition:  Pending ? ?Cerebral Edema s/p [facility] ?Head CT 4/24 shows increased edema with new 50mm midline shift ?Decompressive hemicraniectomy performed 4/24 by neurosurgery ?Remove staples on postop day 10 ?Neurosurgery plans to replace bone flap when swelling subsides, bone flap in abdomen ?Na 155->160->159-> 156->153-> 150-> 152->151-> 145-> 144 ?Off HTS->FW->LR @ 100 ?No midline shift seen on head CT 4/28 and 5/3 ?On keppra 500mg  bid ?Allow Na gradually trending down ? ?Intracranial stenosis ?CTA head & neck R short segment M1 occlusion, left distal M1 high grade  stenosis ?No significant extracranial stenosis ?Most likely related to long standing uncontrolled risk factors. ? ?Hypertension ?Home meds:  Amlodipine 10mg , lisinopril 10mg -resumed ?Stable ?BP goal <160  ?On coreg 25, amlodipine 10, clonidine 0.2 Q8, lisinopril 40  ?PRN labetalol and hydralazine ?Long-term BP goal normotensive ? ?Hyperlipidemia ?Home meds:  Atorvastatin 20mg  ?LDL 96, goal < 70 ?Put on lipitor 40 ?Continue statin at discharge ? ?Diabetes type II Uncontrolled ?Home meds:  Glipizide, metformin ?HgbA1c 8.1, goal < 7.0 ?CBGs ?SSI with resist scale, Levemir 11->40 units twice daily ?DM coordinator consulted ? ?Other Stroke Risk Factors ?Obesity, Body mass index  is 30.98 kg/m?., BMI >/= 30 associated with increased stroke risk, recommend weight loss, diet and exercise as appropriate  ? ?Dysphagia  ?Ileus ?N.p.o. postop, trached ?Cortrak in place ?Gastric tube for decompression per CCM ?KUB 5/3 unchanged, will repeat KUB today ?On LR @ 75and D5 @ 25 ?TF increaded to 26ml/hr ? ?Respiratory failure ?Patient left intubated after surgery ?Ventilator management per CCM ?Tracheostomy placed 5/1 ?On trach collar today ? ?Fever and leukocytosis ??Chicken pox ?Initial blood culture showed staph, now no growth as of 5/6 ?Tmax for 24 hours 38.3 ?WBC 12.3-14.5-18.5-17.2 -> 13.9->10.7-> 13.1 ?Body rash  ?VZV IgM-negative ?VZV PCR negative ?Tracheal aspirate + MRSA -> On vancomycin ? ?Post stroke depression: start lexapro. ? ?Hospital day # 19 ? ?Patient seen and examined by NP/APP with MD. MD to update note as needed.  ? ?Jacob Ores, DNP, FNP-BC ?[facility] ?Pager: [phone] ?  ?I have personally obtained history,examined this patient, reviewed notes, independently viewed imaging studies, participated in medical decision making and plan of care.ROS completed by me personally and pertinent positives fully documented  I have made any additions or clarifications directly to the above note. Agree with  note above.  Continue ongoing medical management.  Consult inpatient rehab.  Wife not available at the bedside.  Greater than 50% time during the 35-minute visit was spent in counseling and coordination of care and disc

## 2021-10-02 ENCOUNTER — Encounter (HOSPITAL_COMMUNITY)
Admission: EM | Disposition: A | Discharge status: Nursing Home (Includes ICF) | Payer: Self-pay | Source: Home / Self Care | Attending: Neurology

## 2021-10-02 ENCOUNTER — Inpatient Hospital Stay (HOSPITAL_COMMUNITY): Payer: BC Managed Care – PPO

## 2021-10-02 ENCOUNTER — Encounter (HOSPITAL_COMMUNITY): Payer: Self-pay | Admitting: Neurology

## 2021-10-02 ENCOUNTER — Inpatient Hospital Stay (HOSPITAL_COMMUNITY): Payer: BC Managed Care – PPO | Admitting: Certified Registered"

## 2021-10-02 DIAGNOSIS — I10 Essential (primary) hypertension: Secondary | ICD-10-CM | POA: Diagnosis not present

## 2021-10-02 DIAGNOSIS — I639 Cerebral infarction, unspecified: Secondary | ICD-10-CM

## 2021-10-02 HISTORY — PX: TEE WITHOUT CARDIOVERSION: SHX5443

## 2021-10-02 HISTORY — PX: BUBBLE STUDY: SHX6837

## 2021-10-02 LAB — CBC
HCT: 26.5 % — ABNORMAL LOW (ref 39.0–52.0)
Hemoglobin: 8.7 g/dL — ABNORMAL LOW (ref 13.0–17.0)
MCH: 28.9 pg (ref 26.0–34.0)
MCHC: 32.8 g/dL (ref 30.0–36.0)
MCV: 88 fL (ref 80.0–100.0)
Platelets: 403 10*3/uL — ABNORMAL HIGH (ref 150–400)
RBC: 3.01 MIL/uL — ABNORMAL LOW (ref 4.22–5.81)
RDW: 13.6 % (ref 11.5–15.5)
WBC: 10.8 10*3/uL — ABNORMAL HIGH (ref 4.0–10.5)
nRBC: 0.2 % (ref 0.0–0.2)

## 2021-10-02 LAB — BASIC METABOLIC PANEL
Anion gap: 6 (ref 5–15)
BUN: 15 mg/dL (ref 6–20)
CO2: 27 mmol/L (ref 22–32)
Calcium: 8.3 mg/dL — ABNORMAL LOW (ref 8.9–10.3)
Chloride: 110 mmol/L (ref 98–111)
Creatinine, Ser: 0.63 mg/dL (ref 0.61–1.24)
GFR, Estimated: >60 mL/min (ref >60)
Glucose, Bld: 191 mg/dL — ABNORMAL HIGH (ref 70–99)
Potassium: 3.6 mmol/L (ref 3.5–5.1)
Sodium: 143 mmol/L (ref 135–145)

## 2021-10-02 LAB — GLUCOSE, CAPILLARY
Glucose-Capillary: 124 mg/dL — ABNORMAL HIGH (ref 70–99)
Glucose-Capillary: 125 mg/dL — ABNORMAL HIGH (ref 70–99)
Glucose-Capillary: 131 mg/dL — ABNORMAL HIGH (ref 70–99)
Glucose-Capillary: 197 mg/dL — ABNORMAL HIGH (ref 70–99)
Glucose-Capillary: 214 mg/dL — ABNORMAL HIGH (ref 70–99)

## 2021-10-02 LAB — CULTURE, BLOOD (ROUTINE X 2)
Culture: NO GROWTH
Culture: NO GROWTH

## 2021-10-02 IMAGING — DX DG ABD PORTABLE 1V
1 series · 1 of 1 positions shown · non-contrast
Comparison: [DATE].

CLINICAL DATA: Feeding tube placement.

EXAM:
PORTABLE ABDOMEN - 1 VIEW

[abdomen supine]
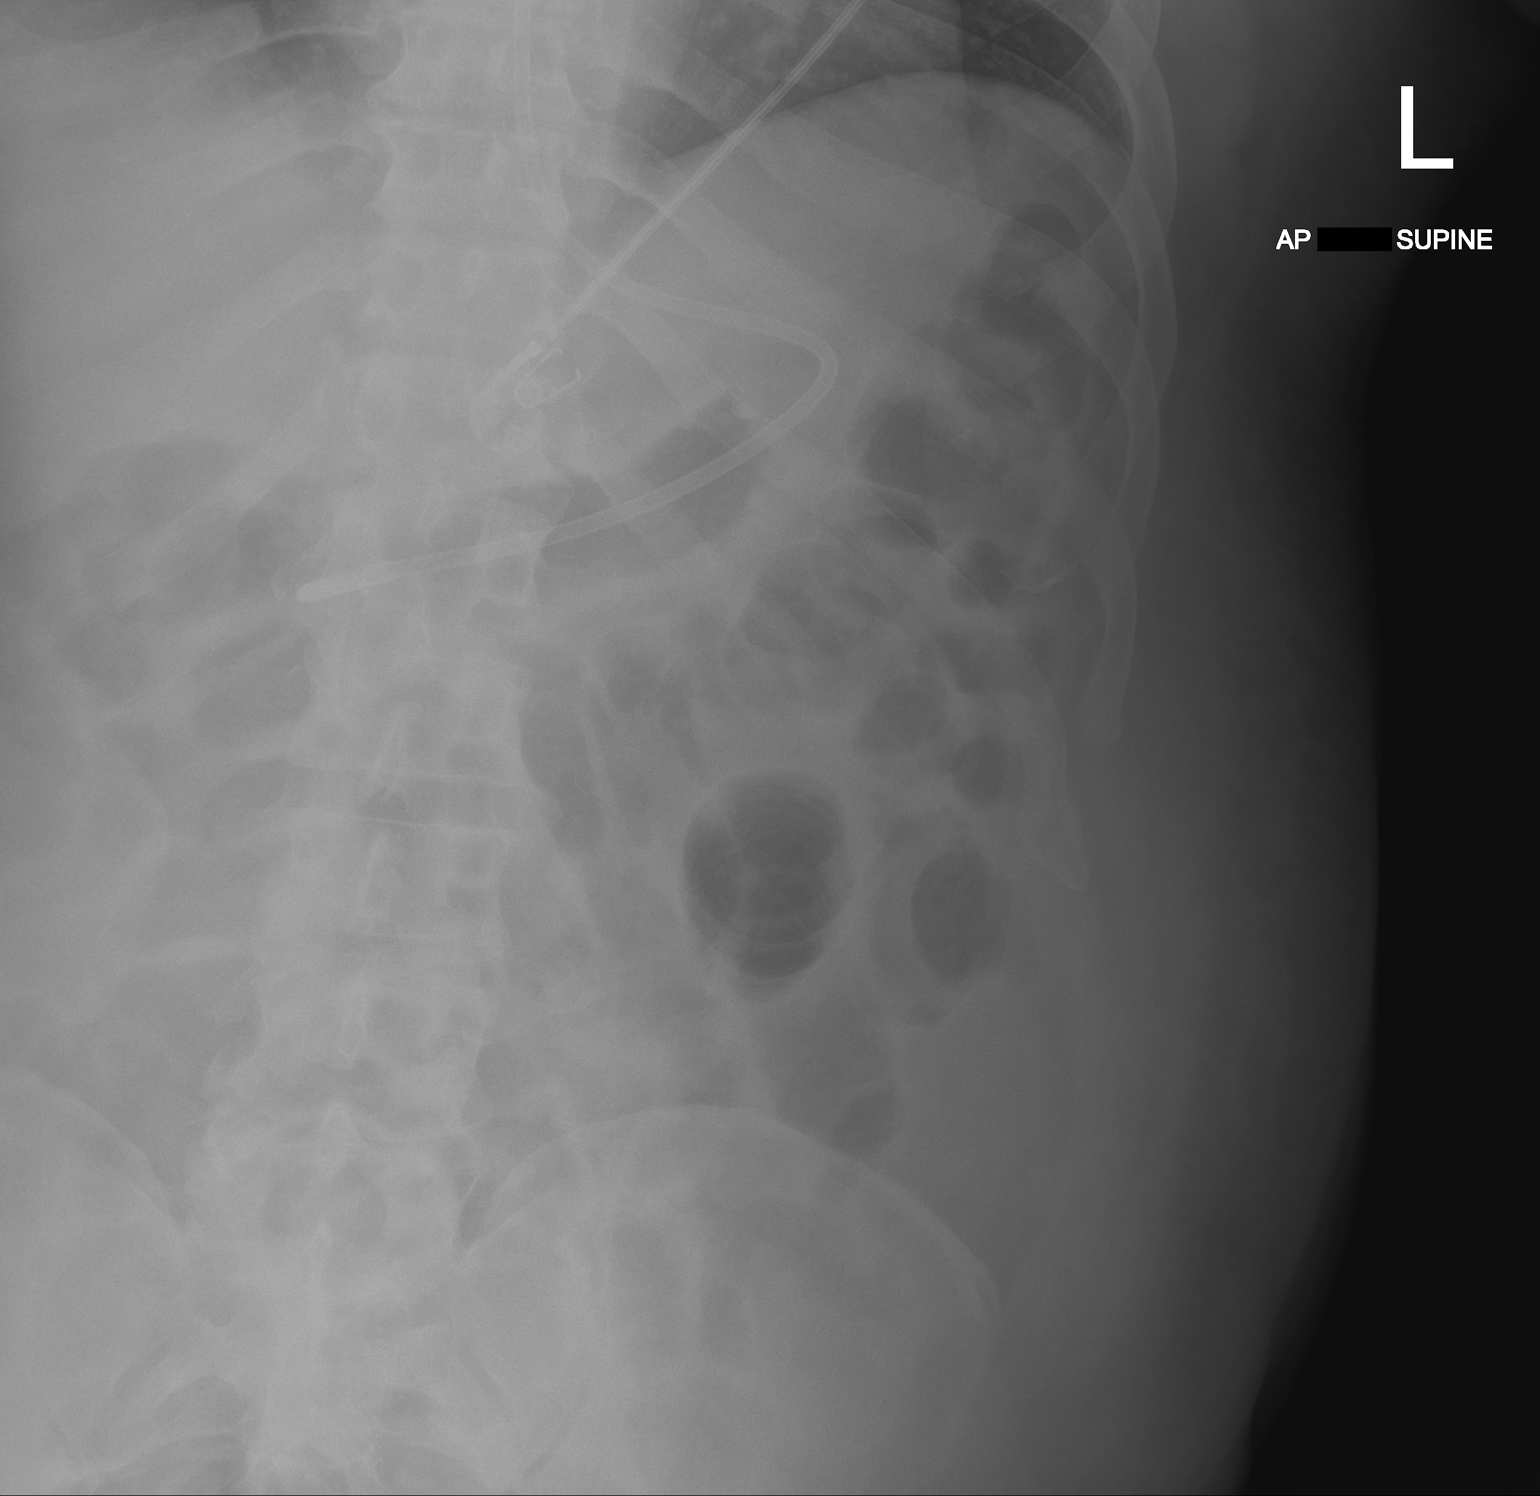

[1 of 1 positions shown; findings below may reference images not displayed]

FINDINGS: Distal tip of feeding tube is seen in expected position of distal
stomach. No abnormal bowel dilatation is noted.
IMPRESSION: Distal tip of feeding tube seen in expected position of distal
stomach.

## 2021-10-02 SURGERY — ECHOCARDIOGRAM, TRANSESOPHAGEAL
Anesthesia: Monitor Anesthesia Care

## 2021-10-02 MED ORDER — BUTAMBEN-TETRACAINE-BENZOCAINE 2-2-14 % EX AERO
INHALATION_SPRAY | CUTANEOUS | Status: DC | PRN
  Administered 2021-10-02: 1 via TOPICAL

## 2021-10-02 MED ORDER — SODIUM CHLORIDE 0.9 % IV SOLN: INTRAVENOUS | Status: DC

## 2021-10-02 MED ORDER — DEXAMETHASONE SODIUM PHOSPHATE 10 MG/ML IJ SOLN: INTRAMUSCULAR | Status: DC | PRN

## 2021-10-02 MED ORDER — PROPOFOL 10 MG/ML IV BOLUS
INTRAVENOUS | Status: DC | PRN
  Administered 2021-10-02 (×2): 25 mg via INTRAVENOUS
  Administered 2021-10-02: 15 mg via INTRAVENOUS

## 2021-10-02 MED ORDER — LIDOCAINE 2% (20 MG/ML) 5 ML SYRINGE
INTRAMUSCULAR | Status: DC | PRN
  Administered 2021-10-02: 60 mg via INTRAVENOUS

## 2021-10-02 MED ORDER — PROPOFOL 500 MG/50ML IV EMUL
INTRAVENOUS | Status: DC | PRN
  Administered 2021-10-02: 100 ug/kg/min via INTRAVENOUS

## 2021-10-02 NOTE — Transfer of Care (Signed)
Immediate Anesthesia Transfer of Care Note ? ?Patient: Jacob [name] ? ?Procedure(s) Performed: TRANSESOPHAGEAL ECHOCARDIOGRAM (TEE) ?BUBBLE STUDY ? ?Patient Location: Endoscopy Unit ? ?Anesthesia Type:MAC ? ?Level of Consciousness: drowsy and patient cooperative ? ?Airway & Oxygen Therapy: Patient Spontanous Breathing and Patient connected to tracheostomy mask oxygen ? ?Post-op Assessment: Report given to RN and Post -op Vital signs reviewed and stable ? ?Post vital signs: Reviewed and stable ? ?Last Vitals:  ?Vitals Value Taken Time  ?BP    ?Temp    ?Pulse    ?Resp    ?SpO2    ? ? ?Last Pain:  ?Vitals:  ? 10/02/21 1015  ?TempSrc: Oral  ?PainSc:   ?   ? ?Patients Stated Pain Goal: 1 (09/14/21 1545) ? ?Complications: No notable events documented. ?

## 2021-10-02 NOTE — Progress Notes (Addendum)
ID Brief note  ? ?Afebrile for more than 48 hrs ?On trach with Fio2 28% ? ?Leukocytosis is downtrending  ?TEE 5/11 with no vegetations/endocarditis but intrapulmonary shunt+ ?5/6 repeat blood cx no growth in 5 days  ? ?Continue Vancomycin while in the hospital  ?Can switch to PO linezolid when ready to be discharged ?Duration 2 weeks from negative cx from 09/27/21. EOT 10/11/21  ?ID will sign off. Please call with questions ? ?Rosiland Oz, MD ?Infectious Disease Physician ?[facility] ?[address] ?[place], [place] [address] ?Phone: [phone]  Fax: [phone]' ? ?

## 2021-10-02 NOTE — Anesthesia Postprocedure Evaluation (Signed)
Anesthesia Post Note ? ?Patient: Jacob [name] ? ?Procedure(s) Performed: TRANSESOPHAGEAL ECHOCARDIOGRAM (TEE) ?BUBBLE STUDY ? ?  ? ?Patient location during evaluation: PACU ?Anesthesia Type: MAC ?Level of consciousness: awake and alert ?Pain management: pain level controlled ?Vital Signs Assessment: post-procedure vital signs reviewed and stable ?Respiratory status: spontaneous breathing, nonlabored ventilation, respiratory function stable and patient connected to nasal cannula oxygen ?Cardiovascular status: stable and blood pressure returned to baseline ?Postop Assessment: no apparent nausea or vomiting ?Anesthetic complications: no ? ? ?No notable events documented. ? ?Last Vitals:  ?Vitals:  ? 10/02/21 0850 10/02/21 1015  ?BP:  (!) 150/100  ?Pulse: 92 81  ?Resp: (!) 30 (!) 22  ?Temp:  37.4 ?C  ?SpO2: 96% 99%  ?  ?Last Pain:  ?Vitals:  ? 10/02/21 1015  ?TempSrc: Oral  ?PainSc:   ? ? ?  ?  ?  ?  ?  ?  ? ?Effie Berkshire ? ? ? ? ?

## 2021-10-02 NOTE — Progress Notes (Signed)
Patient's sutures removed per order/RT protocol.  ?

## 2021-10-02 NOTE — Anesthesia Preprocedure Evaluation (Signed)
Anesthesia Evaluation  ?Patient identified by MRN, date of birth, ID band ? ?Reviewed: ?Allergy & Precautions, Patient's Chart, lab work & pertinent test results ? ?Airway ?Mallampati: Trach ? ? ? ? ? ? Dental ? ?(+) Teeth Intact, Dental Advisory Given ?  ?Pulmonary ? ?  ?breath sounds clear to auscultation ? ? ? ? ? ? Cardiovascular ?hypertension,  ?Rhythm:Regular Rate:Normal ? ? ?  ?Neuro/Psych ?CVA, Residual Symptoms   ? GI/Hepatic ?  ?Endo/Other  ?diabetes ? Renal/GU ?  ? ?  ?Musculoskeletal ? ? Abdominal ?Normal abdominal exam  (+)   ?Peds ? Hematology ?  ?Anesthesia Other Findings ? ? Reproductive/Obstetrics ? ?  ? ? ? ? ? ? ? ? ? ? ? ? ? ?  ?  ? ? ? ? ? ? ? ? ?Anesthesia Physical ?Anesthesia Plan ? ?ASA: 3 ? ?Anesthesia Plan: MAC  ? ?Post-op Pain Management:   ? ?Induction: Intravenous ? ?PONV Risk Score and Plan: 0 and Propofol infusion ? ?Airway Management Planned: Tracheostomy ? ?Additional Equipment: None ? ?Intra-op Plan:  ? ?Post-operative Plan:  ? ?Informed Consent: I have reviewed the patients History and Physical, chart, labs and discussed the procedure including the risks, benefits and alternatives for the proposed anesthesia with the patient or authorized representative who has indicated his/her understanding and acceptance.  ? ? ? ?Consent reviewed with POA ? ?Plan Discussed with: CRNA ? ?Anesthesia Plan Comments: (Minimal sedation, local anesthesia applied. )  ? ? ? ? ? ? ?Anesthesia Quick Evaluation ? ?

## 2021-10-02 NOTE — Interval H&P Note (Signed)
History and Physical Interval Note: ? ?10/02/2021 ?10:19 AM ? ?Jacob [name]  has presented today for surgery, with the diagnosis of STROKE.  The various methods of treatment have been discussed with the patient and family. After consideration of risks, benefits and other options for treatment, the patient has consented to  Procedure(s): ?TRANSESOPHAGEAL ECHOCARDIOGRAM (TEE) (N/A) as a surgical intervention.  The patient's history has been reviewed, patient examined, no change in status, stable for surgery.  I have reviewed the patient's chart and labs.  Questions were answered to the patient's satisfaction.   ? ? ?Jacob [name] ? ? ?

## 2021-10-02 NOTE — Progress Notes (Signed)
?  Echocardiogram ?Echocardiogram Transesophageal has been performed. ? ?Jacob [name] ?10/02/2021, 12:05 PM ?

## 2021-10-02 NOTE — Progress Notes (Signed)
Occupational Therapy Treatment ?Patient Details ?Name: Jacob [name] ?MRN: [name] ?DOB: 11-29-1975 ?Today's Date: 10/02/2021 ? ? ?History of present illness 46 y.o. male presents to [facility] hospital on 09/12/2021 with L facial droop and arm weakness. CTA revealed R M1 occlusion. Pt received tNK. Pt underwent thrombectomy on 4/21. Underwent R frontotemporal parietal hemicraniectomy on 4/24 after declining 4/23. CT + Right MCA/PCA infarct. Trach placement 09/22/2021.  PMH includes HTN and DM. ?  ?OT comments ? Pt seen after TEE and although tired, was able to participate with therapy. Pt mobilized to EOB with Max A +2. Pt pushing toward L, also offloading what appeared to be a painful R hip. Wife states he has a hx of "sciatica". Once EOB, pt assisted with washing face and suctioning secretions. Due to complaints of pain and "dizziness, Pt returned to supine and left in modified chair position. Glasses partially occluded on the R field to attempt to increase visual scanning and attention to the L. Wife/nsg educated on importance of keeping LUE elevated on 2 pillows to reduce dependent edema. Pt would benefit from extensive post acute rehab. Pt is good candidate for AIR however will most likely need significant physical assist with ADL and mobility after DC.   ? ?Recommendations for follow up therapy are one component of a multi-disciplinary discharge planning process, led by the attending physician.  Recommendations may be updated based on patient status, additional functional criteria and insurance authorization. ?   ?Follow Up Recommendations ?    ?  ?Assistance Recommended at Discharge Frequent or constant Supervision/Assistance  ?Patient can return home with the following ? Two people to help with walking and/or transfers;Two people to help with bathing/dressing/bathroom;Assistance with cooking/housework;Assistance with feeding;Direct supervision/assist for medications management;Direct supervision/assist for financial  management;Assist for transportation;Help with stairs or ramp for entrance ?  ?Equipment Recommendations ? Wheelchair (measurements OT);Wheelchair cushion (measurements OT);Hospital bed  ?  ?Recommendations for Other Services Rehab consult ? ?  ?Precautions / Restrictions Precautions ?Precautions: Fall ?Precaution Comments: trach, R crani with bone flap in abdomen  ? ? ?  ? ?Mobility Bed Mobility ?Overal bed mobility: Needs Assistance ?Bed Mobility: Rolling, Sidelying to Sit, Sit to Sidelying ?Rolling: Max assist ?  ?Supine to sit: Max assist, +2 for physical assistance ?  ?Sit to sidelying: Max assist, +2 for physical assistance ?General bed mobility comments: Pt with apparent motor planning deficits however with repetition able to engage in mobility ?  ? ?Transfers ?  ?  ?  ?  ?  ?  ?  ?  ?  ?General transfer comment: did not attempt due to fatigue ?  ?  ?Balance   ?  ?Sitting balance-Leahy Scale: Poor ?Sitting balance - Comments: able to maintain midlein postrual control at times EOB; Pt with R hip pain when stting and would offload by pushing L; also feels pt is a "pusher" ?Postural control: Posterior lean, Left lateral lean ?  ?  ?  ?  ?  ?  ?  ?  ?  ?  ?  ?  ?  ?  ?   ? ?ADL either performed or assessed with clinical judgement  ? ?ADL   ?Eating/Feeding: NPO ?  ?Grooming: Wash/dry face;Minimal assistance ?  ?Upper Body Bathing: Moderate assistance;Bed level ?  ?  ?  ?  ?  ?  ?  ?  ?  ?  ?  ?  ?  ?Functional mobility during ADLs: Maximal assistance;+2 for physical assistance ?  ?  ? ?  Extremity/Trunk Assessment Upper Extremity Assessment ?Upper Extremity Assessment: RUE deficits/detail ?RUE Deficits / Details: generalized weakness; ablet o lift off bed and place on bedrail; ableto pull against therapist to right welf to midlien ; gross/grasp/release; decreased fine motor ?LUE Deficits / Details: flaccid; edematous ?  ?Lower Extremity Assessment ?Lower Extremity Assessment: Defer to PT evaluation ?  ?  ?   ? ?Vision   ?Vision Assessment?: Vision impaired- to be further tested in functional context ?Ocular Range of Motion: Restricted on the left ?Alignment/Gaze Preference: Gaze right ?Tracking/Visual Pursuits: Unable to hold eye position out of midline;Decreased smoothness of horizontal tracking;Requires cues, head turns, or add eye shifts to track ?Convergence: Impaired (comment) ?Visual Fields: Left visual field deficit ?Depth Perception: Overshoots ?Additional Comments: attempted to use glasses with R field occluded ?  ?Perception Perception ?Perception: Impaired (L neglect) ?  ?Praxis Praxis ?Praxis: Impaired ?Praxis Impairment Details: Initiation;Motor planning ?  ? ?Cognition Arousal/Alertness: Lethargic, Suspect due to medications (TEE earlier) ?Behavior During Therapy: Flat affect, Restless ?Overall Cognitive Status: Difficult to assess ?Area of Impairment: Attention, Following commands, Safety/judgement, Awareness, Problem solving ?  ?  ?  ?  ?  ?  ?  ?  ?  ?Current Attention Level: Sustained ?  ?Following Commands: Follows one step commands consistently ?Safety/Judgement: Decreased awareness of safety, Decreased awareness of deficits ?Awareness: Intellectual ?Problem Solving: Slow processing, Decreased initiation, Difficulty sequencing, Requires verbal cues, Requires tactile cues ?General Comments: giving thumbs up; closing eyes on commnd but unable to close eyes x 2 in attempts to communicate using eye blinks ?  ?  ?   ?Exercises Exercises: General Upper Extremity ?General Exercises - Upper Extremity ?Shoulder Flexion: Left, PROM, 5 reps ?Shoulder ABduction: PROM, Left, 5 reps ?Elbow Flexion: PROM, Left, 5 reps ?Elbow Extension: PROM, Left, 5 reps ?Wrist Flexion: PROM, Left, 5 reps, Seated ?Digit Composite Flexion: PROM, Left, 5 reps, Seated ?Composite Extension: PROM, Left, 10 reps ?Other Exercises ?Other Exercises: educated wife on keeping LUE supported ? ?  ?Shoulder Instructions   ? ? ?  ?General  Comments    ? ? ?Pertinent Vitals/ Pain       Pain Assessment ?Pain Assessment: Faces ?Faces Pain Scale: Hurts little more ?Pain Location: R hip; headache ?Pain Descriptors / Indicators: Discomfort, Guarding ?Pain Intervention(s): Limited activity within patient's tolerance, Patient requesting pain meds-RN notified ? ?Home Living   ?  ?  ?  ?  ?  ?  ?  ?  ?  ?  ?  ?  ?  ?  ?  ?  ?  ?  ? ?  ?Prior Functioning/Environment    ?  ?  ?  ?   ? ?Frequency ?    ? ? ? ? ?  ?Progress Toward Goals ? ?OT Goals(current goals can now be found in the care plan section) ? Progress towards OT goals: Progressing toward goals ? ?Acute Rehab OT Goals ?Patient Stated Goal: per wife to get rehab ?OT Goal Formulation: With family ?Time For Goal Achievement: 10/10/21 ?Potential to Achieve Goals: Good ?ADL Goals ?Pt Will Perform Grooming: with min assist;sitting ?Pt Will Perform Upper Body Bathing: with mod assist ?Pt Will Perform Upper Body Dressing: with min assist;sitting ?Pt Will Transfer to Toilet: with mod assist;squat pivot transfer;bedside commode ?Pt/caregiver will Perform Home Exercise Program: Increased strength;Left upper extremity;With minimal assist;With written HEP provided ?Additional ADL Goal #1: Pt will maintain midline postural control EOB with mod A in preparation for ADL tasks ?Additional ADL Goal #2:  Pt will track to midline 1/3 trials to increase visual scanning for ADL tasks  ?Plan Discharge plan remains appropriate;Frequency needs to be updated   ? ?Co-evaluation ? ? ?   ?  ?  ?  ?  ? ?  ?AM-PAC OT "6 Clicks" Daily Activity     ?Outcome Measure ? ? Help from another person eating meals?: Total ?Help from another person taking care of personal grooming?: A Lot ?Help from another person toileting, which includes using toliet, bedpan, or urinal?: Total ?Help from another person bathing (including washing, rinsing, drying)?: A Lot ?Help from another person to put on and taking off regular upper body clothing?:  Total ?Help from another person to put on and taking off regular lower body clothing?: Total ?6 Click Score: 8 ? ?  ?End of Session   ? ?OT Visit Diagnosis: Unsteadiness on feet (R26.81);Other abnormalities of gait and mobil

## 2021-10-02 NOTE — Progress Notes (Addendum)
STROKE TEAM PROGRESS NOTE  ? ?INTERVAL HISTORY ?Headache overnight, per nursing staff he was awake all night.  On exam today he is drowsy, but following commands.  He has been hemodynamically stable overnight and afebrile.  His neurological exam is stable, and his flap is full and mildly tense.  Patient will possibly be going to CIR vs LTAC soon. Vital signs are stable. ?TEE  today shows no vegetations, clots or PFO.  Possible small intrapulmonary shunt unlikely to be of clinical significance.  Social worker feels [facility] and [facility] is agreeable.  Paperwork has been initiated.  Neurological exam is unchanged.  Vital signs are stable.  White cell count is trending down remains on vancomycin with plan to DC on 10/11/2021 as per ID ? ?Vitals:  ? 10/02/21 1152 10/02/21 1200 10/02/21 1210 10/02/21 1247  ?BP: 119/85 140/87 139/89   ?Pulse: 87 87 86 87  ?Resp: (!) 22 (!) 22 (!) 21 (!) 24  ?Temp:      ?TempSrc:      ?SpO2: 96% 97% 98% 99%  ?Weight:      ?Height:      ? ?CBC:  ?Recent Labs  ?Lab 09/26/21 ?0405 09/27/21 ?0326 10/01/21 ?0211 10/02/21 ?0541  ?WBC 13.9*   < > 12.2* 10.8*  ?NEUTROABS 11.2*  --   --   --   ?HGB 10.1*   < > 9.4* 8.7*  ?HCT 31.6*   < > 29.0* 26.5*  ?MCV 90.0   < > 88.4 88.0  ?PLT 210   < > 244 403*  ? < > = values in this interval not displayed.  ? ?Basic Metabolic Panel:  ?Recent Labs  ?Lab 09/26/21 ?0405 09/27/21 ?0326 09/28/21 ?0233 10/01/21 ?0211 10/02/21 ?0541  ?NA 151* 153*  152*   < > 142 143  ?K 3.4* 3.2*  3.1*   < > 3.4* 3.6  ?CL 120* 123*  122*   < > 112* 110  ?CO2 23 22  22    < > 25 27  ?GLUCOSE 163* 130*  129*   < > 248* 191*  ?BUN 48* 36*  36*   < > 18 15  ?CREATININE 1.23 1.17  1.13   < > 0.77 0.63  ?CALCIUM 8.4* 8.2*  8.2*   < > 8.1* 8.3*  ?PHOS 4.0 3.4  --   --   --   ? < > = values in this interval not displayed.  ? ?Lipid Panel:  ?No results for input(s): CHOL, TRIG, HDL, CHOLHDL, VLDL, LDLCALC in the last 168 hours. ? ?HgbA1c:  ?No  results for input(s): HGBA1C in the last 168 hours. ? ?Urine Drug Screen:  ?No results for input(s): LABOPIA, COCAINSCRNUR, LABBENZ, AMPHETMU, THCU, LABBARB in the last 168 hours. ?  ?Alcohol Level No results for input(s): ETH in the last 168 hours. ? ?IMAGING past 24 hours ?ECHO TEE ? ?Result Date: 10/02/2021 ?   TRANSESOPHOGEAL ECHO REPORT   Patient Name:   Jacob [name] Date of Exam: 10/02/2021 Medical Rec #:  [id number]      Height:       73.0 in Accession #:    [id number]     Weight:       234.8 lb Date of Birth:  12-06-1975      BSA:          2.303 m? Patient Age:    46 years       BP:  156/100 mmHg Patient Gender: M              HR:           92 bpm. Exam Location:  Inpatient Procedure: Transesophageal Echo Indications:     stroke  History:         Patient has prior history of Echocardiogram examinations, most                  recent 09/27/2021. Risk Factors:Hypertension, Dyslipidemia and                  Diabetes.  Sonographer:     Johny Chess RDCS Referring Phys:  [place] Diagnosing Phys: Dorris Carnes MD PROCEDURE: After discussion of the risks and benefits of a TEE, an informed consent was obtained from the patient. The transesophogeal probe was passed without difficulty through the esophogus of the patient. Imaged were obtained with the patient in a left lateral decubitus position. Local oropharyngeal anesthetic was provided with Cetacaine. Sedation performed by different physician. The patient was monitored while under deep sedation. Anesthestetic sedation was provided intravenously by Anesthesiology: 109mg  of Propofol. The patient developed no complications during the procedure. IMPRESSIONS  1. No vegetations.  2. Left ventricular ejection fraction, by estimation, is 65 to 70%. The left ventricle has normal function.  3. Right ventricular systolic function is normal. The right ventricular size is normal.  4. No left atrial/left atrial appendage thrombus was detected.  5. A small  pericardial effusion is present. The pericardial effusion is circumferential.  6. The mitral valve is normal in structure. Trivial mitral valve regurgitation.  7. The aortic valve is normal in structure. Aortic valve regurgitation is not visualized.  8. Agitated saline contrast bubble study was positive with shunting observed after >6 cardiac cycles suggestive of intrapulmonary shunting. FINDINGS  Left Ventricle: Left ventricular ejection fraction, by estimation, is 65 to 70%. The left ventricle has normal function. The left ventricular internal cavity size was normal in size. Right Ventricle: The right ventricular size is normal. Right vetricular wall thickness was not assessed. Right ventricular systolic function is normal. Left Atrium: Left atrial size was normal in size. No left atrial/left atrial appendage thrombus was detected. Right Atrium: Right atrial size was normal in size. Pericardium: A small pericardial effusion is present. The pericardial effusion is circumferential. Mitral Valve: The mitral valve is normal in structure. Trivial mitral valve regurgitation. Tricuspid Valve: The tricuspid valve is normal in structure. Tricuspid valve regurgitation is mild. Aortic Valve: The aortic valve is normal in structure. Aortic valve regurgitation is not visualized. Pulmonic Valve: The pulmonic valve was normal in structure. Pulmonic valve regurgitation is trivial. Aorta: The aortic root and ascending aorta are structurally normal, with no evidence of dilitation. IAS/Shunts: No atrial level shunt detected by color flow Doppler. Agitated saline contrast bubble study was positive with shunting observed after >6 cardiac cycles suggestive of intrapulmonary shunting. Dorris Carnes MD Electronically signed by Dorris Carnes MD Signature Date/Time: 10/02/2021/1:40:38 PM    Final    ? ?PHYSICAL EXAM ? ?Physical Exam  ?Constitutional: Appears well-developed and well-nourished middle-age I  African-American male, hemi craniectomy  surgical incision  ?Cardiovascular: Normal rate and regular rhythm.  ?Respiratory: Respirations regular and unlabored on trach collar ? ?Neuro (s/p trach): Sleepy but can be aroused and interactive.  Right gaze deviation, blink to threat. Following commands on the right and midline.  PERRL, cough and gag reflexes present.  Dense left hemiplegia.  Antigravity strength  on the right ? ?ASSESSMENT/PLAN ?Mr. SKYLAR SIEN is a 46 y.o. male with history of HTN, DM2 presenting with left side facial droop, left arm weakness. Received TNKase. Neuro status declined during MRI (NIHSS 13),taken for thrombectomy with Dr. Karenann Cai. TICI3 recanalization of the right M2/MCA achieved.  MRI/MRA shows reoccluded right M2 branch with a right MCA territory infarct.  Continue with PT/OT/SLP evals.  Home blood pressure medications resumed. BP goal less than 160. Topamax and Tylenol #3 for headache management.  Head CT shows new 43mm leftward midline shift. Trasnferred back to ICU, decompressive hemicraniectomy performed by neurosurgery, bone flap placed in abdomen.  Follow-up CT shows 5 mm left midline shift.   HTS d/c'd due to Na of 160.  Tracheostomy placed 5/1.  Now febrile, empiric antibiotic started by CCM.  Tracheal aspirate cultured and blood culture sent.  Blood cultures show no growth as of 5/6, respiratory culture shows MRSA.  Varicella PCR negative ? ?Stroke:  Right MCA due to right MCA occlusion  s/p TNK and IR with TICI3, etiology not certain, but most likely cryptogenic versus intracranial atherosclerosis. ?Code Stroke CT head No acute abnormality ?CTA head & neck R short segment M1 occlusion, left distal M1 high grade stenosis ?Post IR CT No hemorrhagic complication on flat panel CT. ?4/21- Early MRI Tiny acute infarcts in the upper division right MCA cortex. ?4/22- MRI right MCA territory infarct involving the cortex, sparing the basal ganglia.  2 mm midline shift.  No hemorrhage ?4/22- MRA reoccluded right M2  branch, intracranial atherosclerosis including advanced left M1 stenosis ?4/24-  Head CT-increased edema resulting in near complete effacement of the right lateral ventricle and 12 mm leftward midline shift ?4/25- Head

## 2021-10-02 NOTE — Progress Notes (Signed)
SLP Cancellation Note ? ?Patient Details ?Name: Jacob [name] ?MRN: [name] ?DOB: 1975/07/12 ? ? ?Cancelled treatment:       Reason Eval/Treat Not Completed: Patient at procedure or test/unavailable ? ? ?Osie Bond., M.A. CCC-SLP ?Acute Rehabilitation Services ?Office [phone] ? ?Secure chat preferred ? ?10/02/2021, 11:37 AM ?

## 2021-10-02 NOTE — CV Procedure (Signed)
TEE ? ?Indication:  Bacteremia ? ?Patient sedated by anesthesia with propofol intravenously ?Throat anesthetized with cetacaine spray ?Bite guard placed in mouth ?TEE probe advanced to mid esophagus without difficulty ? ?Findings ? LA, LAA without masses ?AV normal  no AI ?MV normal    ?TV normal   Mild TR ?PV normal ?LVEF and RVEF normal    ?No PFO by color doppler   With injection of agitated saline there were small bubbles seen late in LA consistent with intrapulmonary shunt    ?Aorta is normal     ? ?Procedure without complication ?Will need reconfirmation of feeding tube (CoreTrak) before using  ? ?Dorris Carnes MD ?

## 2021-10-02 NOTE — TOC Progression Note (Signed)
Transition of Care (TOC) - Progression Note  ? ? ?Patient Details  ?Name: Jacob [name] ?MRN: [id number] ?Date of Birth: 1975-10-10 ? ?Transition of Care (TOC) CM/SW Contact  ?Curlene Labrum, RN ?Phone Number: ?10/02/2021, 10:06 AM ? ?Clinical Narrative:    ?CM spoke with the patient's wife, Jacob [name], and followed up regarding choice for LTAC placement.  The patient's wife was agreeable to LTAC at [facility].  I called Arley Phenix, Admission director for [facility] and she will start insurance authorization this afternoon.  I updated Dr. Leonie Man regarding bed offer and start of authorization process by the facility. ? ?CM and MSW with DTP Team will continue to follow the patient for [facility] needs - pending LTAC placement at [facility]. ? ? ?Expected Discharge Plan: [place] (LTAC) ?Barriers to Discharge: Continued Medical Work up ? ?Expected Discharge Plan and Services ?Expected Discharge Plan: [place] (LTAC) ?In-house Referral: Clinical Social Work, Development worker, community ?Discharge Planning Services: CM Consult ?Post Acute Care Choice: Long Term Acute Care (LTAC) ?Living arrangements for the past 2 months: [place] ?                ?  ?  ?  ?  ?  ?  ?  ?  ?  ?  ? ? ?Social Determinants of Health (SDOH) Interventions ?  ? ?Readmission Risk Interventions ?   ? View : No data to display.  ?  ?  ?  ? ? ?

## 2021-10-02 NOTE — Anesthesia Procedure Notes (Signed)
Procedure Name: [place] ?Date/Time: 10/02/2021 11:29 AM ?Performed by: Colin Benton, CRNA ?Pre-anesthesia Checklist: Patient identified, Emergency Drugs available, Suction available and Patient being monitored ?Patient Re-evaluated:Patient Re-evaluated prior to induction ?Oxygen Delivery Method: Simple face mask ?Induction Type: IV induction ?Airway Equipment and Method: Bite block ?Placement Confirmation: positive ETCO2 ?Comments: Trach in place.  Patient remains on O2 via trach collar. ? ? ? ? ?

## 2021-10-03 ENCOUNTER — Inpatient Hospital Stay (HOSPITAL_COMMUNITY): Payer: BC Managed Care – PPO

## 2021-10-03 DIAGNOSIS — I639 Cerebral infarction, unspecified: Secondary | ICD-10-CM

## 2021-10-03 LAB — BASIC METABOLIC PANEL
Anion gap: 8 (ref 5–15)
BUN: 15 mg/dL (ref 6–20)
CO2: 26 mmol/L (ref 22–32)
Calcium: 8.3 mg/dL — ABNORMAL LOW (ref 8.9–10.3)
Chloride: 106 mmol/L (ref 98–111)
Creatinine, Ser: 0.77 mg/dL (ref 0.61–1.24)
GFR, Estimated: >60 mL/min (ref >60)
Glucose, Bld: 177 mg/dL — ABNORMAL HIGH (ref 70–99)
Potassium: 3.2 mmol/L — ABNORMAL LOW (ref 3.5–5.1)
Sodium: 140 mmol/L (ref 135–145)

## 2021-10-03 LAB — GLUCOSE, CAPILLARY
Glucose-Capillary: 173 mg/dL — ABNORMAL HIGH (ref 70–99)
Glucose-Capillary: 235 mg/dL — ABNORMAL HIGH (ref 70–99)
Glucose-Capillary: 251 mg/dL — ABNORMAL HIGH (ref 70–99)
Glucose-Capillary: 256 mg/dL — ABNORMAL HIGH (ref 70–99)
Glucose-Capillary: 273 mg/dL — ABNORMAL HIGH (ref 70–99)
Glucose-Capillary: 274 mg/dL — ABNORMAL HIGH (ref 70–99)

## 2021-10-03 LAB — CBC
HCT: 28.8 % — ABNORMAL LOW (ref 39.0–52.0)
Hemoglobin: 9.1 g/dL — ABNORMAL LOW (ref 13.0–17.0)
MCH: 28.3 pg (ref 26.0–34.0)
MCHC: 31.6 g/dL (ref 30.0–36.0)
MCV: 89.4 fL (ref 80.0–100.0)
Platelets: 345 10*3/uL (ref 150–400)
RBC: 3.22 MIL/uL — ABNORMAL LOW (ref 4.22–5.81)
RDW: 13.4 % (ref 11.5–15.5)
WBC: 11.5 10*3/uL — ABNORMAL HIGH (ref 4.0–10.5)
nRBC: 0.2 % (ref 0.0–0.2)

## 2021-10-03 LAB — MISC LABCORP TEST (SEND OUT)
Labcorp test code: 185009
Source (LabCorp): 1

## 2021-10-03 MED ORDER — POTASSIUM CHLORIDE 20 MEQ PO PACK
60.0000 meq | PACK | Freq: Once | ORAL | Status: AC
  Administered 2021-10-03: 60 meq
  Filled 2021-10-03: qty 3

## 2021-10-03 MED ORDER — TOPIRAMATE 25 MG PO TABS
50.0000 mg | ORAL_TABLET | Freq: Two times a day (BID) | ORAL | Status: DC
Start: 2021-10-03 — End: 2021-10-08
  Administered 2021-10-03 – 2021-10-08 (×11): 50 mg
  Filled 2021-10-03 (×11): qty 2

## 2021-10-03 MED ORDER — LINEZOLID 600 MG PO TABS
600.0000 mg | ORAL_TABLET | Freq: Two times a day (BID) | ORAL | Status: DC
  Administered 2021-10-03 – 2021-10-07 (×9): 600 mg via ORAL
  Filled 2021-10-03 (×10): qty 1

## 2021-10-03 MED ORDER — PANTOPRAZOLE 2 MG/ML SUSPENSION
40.0000 mg | Freq: Every day | ORAL | Status: DC
  Administered 2021-10-03 – 2021-10-08 (×6): 40 mg
  Filled 2021-10-03 (×6): qty 20

## 2021-10-03 MED ORDER — LEVETIRACETAM 100 MG/ML PO SOLN
500.0000 mg | Freq: Two times a day (BID) | ORAL | Status: DC
  Administered 2021-10-03 – 2021-10-06 (×7): 500 mg
  Filled 2021-10-03 (×7): qty 5

## 2021-10-03 NOTE — Progress Notes (Signed)
Physical Therapy Treatment ?Patient Details ?Name: Jacob [name] ?MRN: [id number] ?DOB: 1975-08-20 ?Today's Date: 10/03/2021 ? ? ?History of Present Illness 46 y.o. male presents to [facility] hospital on 09/12/2021 with L facial droop and arm weakness. CTA revealed R M1 occlusion. Pt received tNK. Pt underwent thrombectomy on 4/21. Underwent R frontotemporal parietal hemicraniectomy on 4/24 after declining 4/23. CT + Right MCA/PCA infarct. Trach placement 09/22/2021.  PMH includes HTN and DM. ? ?  ?PT Comments  ? ? Pt asleep on entry, RN reports he has needed increased pain medication this afternoon. Pt lethargic but wakes and agreeable to therapy. Found to be incontinent of stool. Rolled to R side for cleaning. Performed R shoulder PROM and truncal rotation is sidelying. Pt obviously not comfortable in L sidelying. Pt with increased secretions throughout session however especially with return to chest upright from supine. Pt with good coughing and secretion clearance, but obviously fatiguing. Deferred further mobilization due to strong R beating nystagmus and confirmed dizziness, increased fatigue from coughing and underlying lethargy from pain medication. D/c plans remain appropriate at this time. PT will continue to follow acutely. ?  ?Recommendations for follow up therapy are one component of a multi-disciplinary discharge planning process, led by the attending physician.  Recommendations may be updated based on patient status, additional functional criteria and insurance authorization. ? ?Follow Up Recommendations ? PT at Long-term acute care hospital (although may benefit from AIR if particpation increases) ?  ?  ?Assistance Recommended at Discharge Frequent or constant Supervision/Assistance  ?Patient can return home with the following Two people to help with walking and/or transfers;Two people to help with bathing/dressing/bathroom;Assistance with cooking/housework;Assistance with feeding;Direct supervision/assist for  medications management;Direct supervision/assist for financial management;Assist for transportation;Help with stairs or ramp for entrance ?  ?Equipment Recommendations ? Wheelchair (measurements PT);Wheelchair cushion (measurements PT);BSC/3in1;Hospital bed;Other (comment)  ?  ?Recommendations for Other Services Rehab consult ? ? ?  ?Precautions / Restrictions Precautions ?Precautions: Fall ?Precaution Comments: trach, R crani with bone flap in abdomen ?Restrictions ?Weight Bearing Restrictions: No  ?  ? ?Mobility ? Bed Mobility ?Overal bed mobility: Needs Assistance ?Bed Mobility: Rolling ?Rolling: Max assist, Total assist ?  ?  ?  ?  ?General bed mobility comments: at beginning of session pt found to be incontinent of stool, able to roll towards R with maxA, requires total A to come towards L likely due to resistance due to discomfort on L side ?  ? ?Transfers ?  ?  ?  ?  ?  ?  ?  ?  ?  ?General transfer comment: did not attempt due to dizziness, pain and increased pain medication this afternoon ?  ? ?  ? ?Modified Rankin (Stroke Patients Only) ?Modified Rankin (Stroke Patients Only) ?Pre-Morbid Rankin Score: No symptoms ?Modified Rankin: Severe disability ? ? ?  ?   ?Cognition Arousal/Alertness: Lethargic ?Behavior During Therapy: Flat affect ?Overall Cognitive Status: Impaired/Different from baseline ?  ?  ?  ?  ?  ?  ?  ?  ?  ?  ?  ?  ?Following Commands: Follows one step commands with increased time ?  ?  ?Problem Solving: Slow processing, Requires verbal cues, Requires tactile cues, Decreased initiation, Difficulty sequencing ?General Comments: asleep on entry, arouses easily, able to express understanding and likes appropriately with thumbs up ?  ?  ? ?  ?Exercises Other Exercises ?Other Exercises: while in R sidelying worked on L shoulder PROM in retraction and protraction ?Other Exercises: truncal rotation in R  sidelying ? ?  ?General Comments General comments (skin integrity, edema, etc.): Pt with  increased secretions today, pt with good clearance of secretions due to strong cough however copious amounts present, especially with rolling for pericare, pt with increased fatigue from coughing, ?  ?  ? ?Pertinent Vitals/Pain Pain Assessment ?Pain Assessment: Faces ?Faces Pain Scale: Hurts a little bit ?Pain Location: generalized ?Pain Descriptors / Indicators: Discomfort, Guarding ?Pain Intervention(s): Limited activity within patient's tolerance, Premedicated before session, Repositioned, Monitored during session  ? ? ? ?PT Goals (current goals can now be found in the care plan section) Acute Rehab PT Goals ?PT Goal Formulation: Patient unable to participate in goal setting ?Time For Goal Achievement: 10/07/21 ?Potential to Achieve Goals: Fair ?Progress towards PT goals: Not progressing toward goals - comment (limited by pain and fatigue) ? ?  ?Frequency ? ? ? Min 2X/week ? ? ? ?  ?PT Plan Current plan remains appropriate  ? ? ?Co-evaluation PT/OT/SLP Co-Evaluation/Treatment: Yes ?Reason for Co-Treatment: Complexity of the patient's impairments (multi-system involvement) ?PT goals addressed during session: Mobility/safety with mobility ?  ?  ? ?  ?AM-PAC PT "6 Clicks" Mobility   ?Outcome Measure ? Help needed turning from your back to your side while in a flat bed without using bedrails?: Total ?Help needed moving from lying on your back to sitting on the side of a flat bed without using bedrails?: Total ?Help needed moving to and from a bed to a chair (including a wheelchair)?: Total ?Help needed standing up from a chair using your arms (e.g., wheelchair or bedside chair)?: Total ?Help needed to walk in hospital room?: Total ?Help needed climbing 3-5 steps with a railing? : Total ?6 Click Score: 6 ? ?  ?End of Session Equipment Utilized During Treatment: Oxygen ?Activity Tolerance: Patient tolerated treatment well;Patient limited by fatigue ?Patient left: in bed;with call bell/phone within reach;with  family/visitor present;with bed alarm set ?Nurse Communication: Mobility status ?PT Visit Diagnosis: Other abnormalities of gait and mobility (R26.89);Muscle weakness (generalized) (M62.81);Other symptoms and signs involving the nervous system (R29.898);Hemiplegia and hemiparesis ?Hemiplegia - Right/Left: Left ?Hemiplegia - dominant/non-dominant: Non-dominant ?Hemiplegia - caused by: Cerebral infarction ?  ? ? ?Time: [id number] ?PT Time Calculation (min) (ACUTE ONLY): 41 min ? ?Charges:  $Therapeutic Activity: 8-22 mins          ?          ? ?Eliya Bubar B. Migdalia Dk PT, DPT ?Acute Rehabilitation Services ?Please use secure chat or  ?Call Office [phone] ? ? ? ?[place] ?10/03/2021, 5:03 PM ? ?

## 2021-10-03 NOTE — Progress Notes (Signed)
Occupational Therapy Treatment ?Patient Details ?Name: Jacob [name] ?MRN: [id number] ?DOB: May 11, 1976 ?Today's Date: 10/03/2021 ? ? ?History of present illness 46 y.o. male presents to [facility] hospital on 09/12/2021 with L facial droop and arm weakness. CTA revealed R M1 occlusion. Pt received tNK. Pt underwent thrombectomy on 4/21. Underwent R frontotemporal parietal hemicraniectomy on 4/24 after declining 4/23. CT + Right MCA/PCA infarct. Trach placement 09/22/2021. Found DVT at right peroneal veins. on 5/12. PMH includes HTN and DM. ?  ?OT comments ? Pt continues to present with decreased vision, functional use of LUE, strength, and activity tolerance. BM at bed level upon arrival. Max-Total A +2 for rolling in bed and Total A for peri care. Facilitating L scapular ROM, L shoulder ROM, and truncal rotation while in sidelying on R. Continue to recommend follow up at long-term care. Will continue to follow acutely as admitted.   ? ?Recommendations for follow up therapy are one component of a multi-disciplinary discharge planning process, led by the attending physician.  Recommendations may be updated based on patient status, additional functional criteria and insurance authorization. ?   ?Follow Up Recommendations ? OT at Long-term acute care hospital  ?  ?Assistance Recommended at Discharge Frequent or constant Supervision/Assistance  ?Patient can return home with the following ? Two people to help with walking and/or transfers;Two people to help with bathing/dressing/bathroom;Assistance with cooking/housework;Assistance with feeding;Direct supervision/assist for medications management;Direct supervision/assist for financial management;Assist for transportation;Help with stairs or ramp for entrance ?  ?Equipment Recommendations ? Wheelchair (measurements OT);Wheelchair cushion (measurements OT);Hospital bed  ?  ?Recommendations for Other Services   ? ?  ?Precautions / Restrictions Precautions ?Precautions: Fall ?Precaution  Comments: trach, R crani with bone flap in abdomen ?Restrictions ?Weight Bearing Restrictions: No  ? ? ?  ? ?Mobility Bed Mobility ?Overal bed mobility: Needs Assistance ?Bed Mobility: Rolling ?Rolling: Max assist, Total assist ?  ?  ?  ?  ?General bed mobility comments: at beginning of session pt found to be incontinent of stool, able to roll towards R with maxA, requires total A to come towards L likely due to resistance due to discomfort on L side ?  ? ?Transfers ?  ?  ?  ?  ?  ?  ?  ?  ?  ?General transfer comment: did not attempt due to dizziness, pain and increased pain medication this afternoon ?  ?  ?Balance   ?  ?  ?  ?  ?  ?  ?  ?  ?  ?  ?  ?  ?  ?  ?  ?  ?  ?  ?   ? ?ADL either performed or assessed with clinical judgement  ? ?ADL Overall ADL's : Needs assistance/impaired ?  ?  ?Grooming: Min guard;Bed level;Wash/dry face ?Grooming Details (indicate cue type and reason): Pt abel to grasp wash clothe with R hand, wipe his eyes, and then place wash clothe on his forehead. ?  ?  ?  ?  ?  ?  ?  ?  ?  ?  ?Toileting- Clothing Manipulation and Hygiene: Total assistance ?Toileting - Clothing Manipulation Details (indicate cue type and reason): bowel incontenience. ?  ?  ?  ?General ADL Comments: Focused session on cleaning after bowel inconenience and facilitating shoulder ROM both in supine and sidelying ?  ? ?Extremity/Trunk Assessment Upper Extremity Assessment ?Upper Extremity Assessment: RUE deficits/detail;LUE deficits/detail ?RUE Deficits / Details: generalized weakness; able to lift off bed and place on bedrail; able  to pull against therapist to right welf to midlien ; gross/grasp/release; decreased fine motor ?LUE Deficits / Details: Spontaneous movement with coughs into extension. Otherwise, no movement. Edematous ?  ?Lower Extremity Assessment ?Lower Extremity Assessment: Defer to PT evaluation ?  ?  ?  ? ?Vision   ?Vision Assessment?: Vision impaired- to be further tested in functional context ?Ocular  Range of Motion: Restricted on the left ?Alignment/Gaze Preference: Gaze right ?Tracking/Visual Pursuits: Unable to hold eye position out of midline;Decreased smoothness of horizontal tracking;Requires cues, head turns, or add eye shifts to track ?Convergence: Impaired (comment) ?Visual Fields: Left visual field deficit ?Depth Perception: Overshoots ?Additional Comments: Noting improved sustained gaze with occlusion glasses on. Also noting significant R pulsating nystagmus ?  ?Perception   ?  ?Praxis   ?  ? ?Cognition Arousal/Alertness: Lethargic ?Behavior During Therapy: Flat affect ?Overall Cognitive Status: Impaired/Different from baseline ?  ?  ?  ?  ?  ?  ?  ?  ?  ?  ?  ?  ?Following Commands: Follows one step commands with increased time ?  ?  ?Problem Solving: Slow processing, Requires verbal cues, Requires tactile cues, Decreased initiation, Difficulty sequencing ?General Comments: asleep on entry, arouses easily, able to express understanding and likes appropriately with thumbs up ?  ?  ?   ?Exercises Exercises: Other exercises, General Upper Extremity ?General Exercises - Upper Extremity ?Elbow Flexion: PROM, Left, 10 reps, Supine ?Elbow Extension: PROM, Left, 10 reps, Supine ?Wrist Flexion: PROM, Left, 10 reps, Supine ?Wrist Extension: PROM, Left, 10 reps, Supine ?Digit Composite Flexion: PROM, Left, 10 reps, Supine ?Composite Extension: PROM, Left, 10 reps, Supine ?Other Exercises ?Other Exercises: while in R sidelying worked on L shoulder PROM in retraction and protraction ?Other Exercises: truncal rotation in R sidelying ?Other Exercises: while in R sidelying worked shoulder flexion with faciltiation of scapular rotation ? ?  ?Shoulder Instructions   ? ? ?  ?General Comments Pt with increased secretions today, pt with good clearance of secretions due to strong cough however copious amounts present, especially with rolling for pericare, pt with increased fatigue from coughing,  ? ? ?Pertinent Vitals/  Pain       Pain Assessment ?Pain Assessment: Faces ?Faces Pain Scale: Hurts a little bit ?Pain Location: generalized; increased grimacing with certain movements of shoulder ?Pain Descriptors / Indicators: Discomfort, Guarding ?Pain Intervention(s): Monitored during session, Repositioned ? ?Home Living   ?  ?  ?  ?  ?  ?  ?  ?  ?  ?  ?  ?  ?  ?  ?  ?  ?  ?  ? ?  ?Prior Functioning/Environment    ?  ?  ?  ?   ? ?Frequency ? Min 2X/week  ? ? ? ? ?  ?Progress Toward Goals ? ?OT Goals(current goals can now be found in the care plan section) ? Progress towards OT goals: Progressing toward goals ? ?Acute Rehab OT Goals ?OT Goal Formulation: With family ?Time For Goal Achievement: 10/10/21 ?Potential to Achieve Goals: Good ?ADL Goals ?Pt Will Perform Grooming: with min assist;sitting ?Pt Will Perform Upper Body Bathing: with mod assist ?Pt Will Perform Upper Body Dressing: with min assist;sitting ?Pt Will Transfer to Toilet: with mod assist;squat pivot transfer;bedside commode ?Pt/caregiver will Perform Home Exercise Program: Increased strength;Left upper extremity;With minimal assist;With written HEP provided ?Additional ADL Goal #1: Pt will maintain midline postural control EOB with mod A in preparation for ADL tasks ?Additional ADL Goal #2:  Pt will track to midline 1/3 trials to increase visual scanning for ADL tasks  ?Plan Discharge plan remains appropriate   ? ?Co-evaluation ? ? ? PT/OT/SLP Co-Evaluation/Treatment: Yes ?Reason for Co-Treatment: For patient/therapist safety;To address functional/ADL transfers ?PT goals addressed during session: Mobility/safety with mobility ?OT goals addressed during session: ADL's and self-care ?  ? ?  ?AM-PAC OT "6 Clicks" Daily Activity     ?Outcome Measure ? ? Help from another person eating meals?: Total ?Help from another person taking care of personal grooming?: A Lot ?Help from another person toileting, which includes using toliet, bedpan, or urinal?: Total ?Help from another  person bathing (including washing, rinsing, drying)?: A Lot ?Help from another person to put on and taking off regular upper body clothing?: Total ?Help from another person to put on and taking off regu

## 2021-10-03 NOTE — Progress Notes (Addendum)
:.  Show STROKE TEAM PROGRESS NOTE  ? ?INTERVAL HISTORY ?Patient remains hemodynamically stable with no acute events overnight.  He continues to c/o headache- will try Topamax.  Lower extremity ultrasound shows  right peroneal vein DVT. ? ?Vitals:  ? 10/03/21 0841 10/03/21 [id number] 10/03/21 1120 10/03/21 1134  ?BP: (!) 165/99 (!) 172/97 131/87 131/87  ?Pulse: (!) 102 97 87 85  ?Resp: (!) 22 (!) 21 19 18   ?Temp:      ?TempSrc:      ?SpO2: 98% 100% 100% 100%  ?Weight:      ?Height:      ? ?CBC:  ?Recent Labs  ?Lab 10/02/21 ?[id number] 10/03/21 ?0302  ?WBC 10.8* 11.5*  ?HGB 8.7* 9.1*  ?HCT 26.5* 28.8*  ?MCV 88.0 89.4  ?PLT 403* 345  ? ? ?Basic Metabolic Panel:  ?Recent Labs  ?Lab 09/27/21 ?0326 09/28/21 ?0233 10/02/21 ?[id number] 10/03/21 ?0302  ?NA 153*  152*   < > 143 140  ?K 3.2*  3.1*   < > 3.6 3.2*  ?CL 123*  122*   < > 110 106  ?CO2 22  22   < > 27 26  ?GLUCOSE 130*  129*   < > 191* 177*  ?BUN 36*  36*   < > 15 15  ?CREATININE 1.17  1.13   < > 0.63 0.77  ?CALCIUM 8.2*  8.2*   < > 8.3* 8.3*  ?PHOS 3.4  --   --   --   ? < > = values in this interval not displayed.  ? ? ?Lipid Panel:  ?No results for input(s): CHOL, TRIG, HDL, CHOLHDL, VLDL, LDLCALC in the last 168 hours. ? ?HgbA1c:  ?No results for input(s): HGBA1C in the last 168 hours. ? ?Urine Drug Screen:  ?No results for input(s): LABOPIA, COCAINSCRNUR, LABBENZ, AMPHETMU, THCU, LABBARB in the last 168 hours. ?  ?Alcohol Level No results for input(s): ETH in the last 168 hours. ? ?IMAGING past 24 hours ?DG Abd Portable 1V ? ?Result Date: 10/02/2021 ?CLINICAL DATA:  Feeding tube placement. EXAM: PORTABLE ABDOMEN - 1 VIEW COMPARISON:  Sep 30, 2021. FINDINGS: Distal tip of feeding tube is seen in expected position of distal stomach. No abnormal bowel dilatation is noted. IMPRESSION: Distal tip of feeding tube seen in expected position of distal stomach. Electronically Signed   By: Marijo Conception M.D.   On: 10/02/2021 15:20  ? ?VAS [place] LOWER EXTREMITY VENOUS (DVT) ? ?Result  Date: 10/03/2021 ? Lower Venous DVT Study Patient Name:  Jacob [name]  Date of Exam:   10/03/2021 Medical Rec #: [id number]       Accession #:    [id number] Date of Birth: 06-10-1975       Patient Gender: M Patient Age:   46 years Exam Location:  [facility] Procedure:      VAS [place] LOWER EXTREMITY VENOUS (DVT) Referring Phys: Khaliq Turay --------------------------------------------------------------------------------  Indications: Embolic stroke.  Comparison Study: No prior lower extremity venous studies. Performing Technologist: Darlin Coco RDMS, RVT  Examination Guidelines: A complete evaluation includes B-mode imaging, spectral Doppler, color Doppler, and power Doppler as needed of all accessible portions of each vessel. Bilateral testing is considered an integral part of a complete examination. Limited examinations for reoccurring indications may be performed as noted. The reflux portion of the exam is performed with the patient in reverse Trendelenburg.  +---------+---------------+---------+-----------+----------+--------------+ RIGHT    CompressibilityPhasicitySpontaneityPropertiesThrombus Aging +---------+---------------+---------+-----------+----------+--------------+ CFV      Full  Yes      Yes                                 +---------+---------------+---------+-----------+----------+--------------+ SFJ      Full                                                        +---------+---------------+---------+-----------+----------+--------------+ FV Prox  Full                                                        +---------+---------------+---------+-----------+----------+--------------+ FV Mid   Full                                                        +---------+---------------+---------+-----------+----------+--------------+ FV DistalFull                                                         +---------+---------------+---------+-----------+----------+--------------+ PFV      Full                                                        +---------+---------------+---------+-----------+----------+--------------+ POP      Full           Yes      Yes                                 +---------+---------------+---------+-----------+----------+--------------+ PTV      Full                                                        +---------+---------------+---------+-----------+----------+--------------+ PERO     None           No       No                   Acute          +---------+---------------+---------+-----------+----------+--------------+   +---------+---------------+---------+-----------+----------+--------------+ LEFT     CompressibilityPhasicitySpontaneityPropertiesThrombus Aging +---------+---------------+---------+-----------+----------+--------------+ CFV      Full           Yes      Yes                                 +---------+---------------+---------+-----------+----------+--------------+ SFJ      Full                                                        +---------+---------------+---------+-----------+----------+--------------+  FV Prox  Full                                                        +---------+---------------+---------+-----------+----------+--------------+ FV Mid   Full                                                        +---------+---------------+---------+-----------+----------+--------------+ FV DistalFull                                                        +---------+---------------+---------+-----------+----------+--------------+ PFV      Full                                                        +---------+---------------+---------+-----------+----------+--------------+ POP      Full           Yes      Yes                                  +---------+---------------+---------+-----------+----------+--------------+ PTV      Full                                                        +---------+---------------+---------+-----------+----------+--------------+ PERO     Full                                                        +---------+---------------+---------+-----------+----------+--------------+     Summary: RIGHT: - Findings consistent with acute deep vein thrombosis involving the right peroneal veins.  - No cystic structure found in the popliteal fossa.  LEFT: - There is no evidence of deep vein thrombosis in the lower extremity.  - No cystic structure found in the popliteal fossa.  *See table(s) above for measurements and observations.    Preliminary    ? ?PHYSICAL EXAM ? ?Physical Exam  ?Constitutional: Appears well-developed and well-nourished middle-aged African-American male, hemi craniectomy surgical incision  ?Cardiovascular: Normal rate and regular rhythm.  ?Respiratory: Respirations regular and unlabored on trach collar ? ?Neuro (s/p trach): Sleepy but can be aroused and interactive.  Right gaze deviation, blink to threat. Following commands on the right and midline.  PERRL, cough and gag reflexes present.  Dense left hemiplegia.  Antigravity strength on the right ? ?ASSESSMENT/PLAN ?[name] is a 46 y.o. male with history of HTN, DM2 presenting with left side facial droop, left arm weakness. Received TNKase. Neuro status  declined during MRI (NIHSS 13),taken for thrombectomy with Dr. Karenann Cai. TICI3 recanalization of the right M2/MCA achieved.  MRI/MRA shows reoccluded right M2 branch with a right MCA territory infarct.  Continue with PT/OT/SLP evals.  Home blood pressure medications resumed. BP goal less than 160. Topamax and Tylenol #3 for headache management.  Head CT shows new 53mm leftward midline shift. Trasnferred back to ICU, decompressive hemicraniectomy performed by neurosurgery, bone flap  placed in abdomen.  Follow-up CT shows 5 mm left midline shift.   HTS d/c'd due to Na of 160.  Tracheostomy placed 5/1.  Now febrile, empiric antibiotic started by CCM.  Tracheal aspirate cultured and blood culture sent.  Blood cultures show no growth as of 5/6, respiratory culture shows MRSA.  Varicella PCR negative.  DVT in right peroneal vein. ? ?Stroke:  Right MCA due to right MCA occlusion  s/p TNK and IR with TICI3, etiology not certain, but mo

## 2021-10-03 NOTE — Progress Notes (Signed)
Lower extremity venous bilateral study completed. ? ?Preliminary results relayed to Gerald Stabs, RN at bedside. ? ?See CV Proc for preliminary results report.  ? ?Darlin Coco, RDMS, RVT ? ?

## 2021-10-03 NOTE — Progress Notes (Signed)
Speech Language Pathology Treatment: Cognitive-Linquistic  ?Patient Details ?Name: Jacob [name] ?MRN: [id number] ?DOB: 1975/08/16 ?Today's Date: 10/03/2021 ?Time: [id number] ?SLP Time Calculation (min) (ACUTE ONLY): 22 min ? ?Assessment / Plan / Recommendation ?Clinical Impression ? Pt continues to exhibit decreased upper airway patency with PMV trials, so PMV was not placed for more than ~30 seconds at a time. Cues provided to cough or clear his throat did not elicit much of a response and so were therefore not effective in clearing. Education was provided to wife, [name], including pictures, about PMV and anatomy s/p trach with explanation about why he is suspected to not be tolerating PMV at this point. He would benefit from a cuffless, and likely also smaller, trach.  ? ?Session primarily therefore focused on cognition and communication. SLP provided functional opportunities for pt to attend to the L side of his body and environment. With verbal and tactile cues, pt is able to come toward his left side. When also utilizing glasses from OT, he can bring his gaze more toward midline. Pt started responding to a few yes/no questions with head nods toward the end of the session but then he started to become lethargic - suspect effects of pain meds recently provided by RN. Will continue to follow acutely. ?  ?HPI HPI: Pt is a 46 y.o. male with who presented with left sided facial droop. TNK given. MRI brain 4/21: Tiny acute infarcts in the upper division right MCA cortex. Worsening neuro status noted after MRI with left-sided neglect, right gaze preference, sensory and vision loss, and progressive left sided weakness. Pt s/p  thrombectomy and revascularization 4/21. Dys 2 diet and thin liquids was started by SLP 4/21. Pt underwent R frontotemporal parietal hemicraniectomy on 4/24 after declining 4/23. Intubated 4/24; trach 5/1. PMH: HTN and DM ?  ?   ?SLP Plan ? Continue with current plan of care ? ?  ?   ?Recommendations for follow up therapy are one component of a multi-disciplinary discharge planning process, led by the attending physician.  Recommendations may be updated based on patient status, additional functional criteria and insurance authorization. ?  ? ?Recommendations  ?   ?   ? Patient may use Passy-Muir Speech Valve: with SLP only ?MD: Please consider changing trach tube to : Cuffless;Smaller size  ?   ? ? ? ? Oral Care Recommendations: Oral care QID ?Follow Up Recommendations: Acute inpatient rehab (3hours/day) ?Assistance recommended at discharge: Frequent or constant Supervision/Assistance ?SLP Visit Diagnosis: Cognitive communication deficit (R41.841) ?Plan: Continue with current plan of care ? ? ? ? ?  ?  ? ? ?Osie Bond., M.A. CCC-SLP ?Acute Rehabilitation Services ?Office [phone] ? ?Secure chat preferred ? ? ?10/03/2021, 10:12 AM ?

## 2021-10-03 NOTE — Progress Notes (Signed)
? ?Providing Compassionate, Quality Care - Together ? ? ?Subjective: ?Nurse reports patient with headache this AM. Patient's neuro exam remains stable. ? ?Objective: ?Vital signs in last 24 hours: ?Temp:  [98.2 ?F (36.8 ?C)-99.3 ?F (37.4 ?C)] 99.3 ?F (37.4 ?C) (05/12 0325) ?Pulse Rate:  [81-103] 102 (05/12 0841) ?Resp:  [17-25] 22 (05/12 0841) ?BP: (119-165)/(85-100) 165/99 (05/12 0841) ?SpO2:  [96 %-100 %] 98 % (05/12 0841) ?FiO2 (%):  [28 %] 28 % (05/12 0841) ? ?Intake/Output from previous day: ?05/11 0701 - 05/12 0700 ?In: 1400 [NG/GT:600; IV Piggyback:800] ?Out: 1700 [Urine:1700] ?Intake/Output this shift: ?No intake/output data recorded. ? ?Alert ?Follows commands on R ?Trach with trach collar ?No verbal communication ?No movement LUE, LLE ?Crani incision and abdominal incision healing well ? ?Lab Results: ?Recent Labs  ?  10/02/21 ?[id number] 10/03/21 ?0302  ?WBC 10.8* 11.5*  ?HGB 8.7* 9.1*  ?HCT 26.5* 28.8*  ?PLT 403* 345  ? ?BMET ?Recent Labs  ?  10/02/21 ?[id number] 10/03/21 ?0302  ?NA 143 140  ?K 3.6 3.2*  ?CL 110 106  ?CO2 27 26  ?GLUCOSE 191* 177*  ?BUN 15 15  ?CREATININE 0.63 0.77  ?CALCIUM 8.3* 8.3*  ? ? ?Studies/Results: ?DG Abd Portable 1V ? ?Result Date: 10/02/2021 ?CLINICAL DATA:  Feeding tube placement. EXAM: PORTABLE ABDOMEN - 1 VIEW COMPARISON:  Sep 30, 2021. FINDINGS: Distal tip of feeding tube is seen in expected position of distal stomach. No abnormal bowel dilatation is noted. IMPRESSION: Distal tip of feeding tube seen in expected position of distal stomach. Electronically Signed   By: Marijo Conception M.D.   On: 10/02/2021 15:20  ? ?ECHO TEE ? ?Result Date: 10/02/2021 ?   TRANSESOPHOGEAL ECHO REPORT   Patient Name:   Jacob [name] Date of Exam: 10/02/2021 Medical Rec #:  [id number]      Height:       73.0 in Accession #:    [id number]     Weight:       234.8 lb Date of Birth:  [birth year]      BSA:          2.303 m? Patient Age:    46 years       BP:           156/100 mmHg Patient Gender: M              HR:            92 bpm. Exam Location:  Inpatient Procedure: Transesophageal Echo Indications:     stroke  History:         Patient has prior history of Echocardiogram examinations, most                  recent 09/27/2021. Risk Factors:Hypertension, Dyslipidemia and                  Diabetes.  Sonographer:     Johny Chess RDCS Referring Phys:  [place] Diagnosing Phys: Dorris Carnes MD PROCEDURE: After discussion of the risks and benefits of a TEE, an informed consent was obtained from the patient. The transesophogeal probe was passed without difficulty through the esophogus of the patient. Imaged were obtained with the patient in a left lateral decubitus position. Local oropharyngeal anesthetic was provided with Cetacaine. Sedation performed by different physician. The patient was monitored while under deep sedation. Anesthestetic sedation was provided intravenously by Anesthesiology: 109mg  of Propofol. The patient developed no complications during the procedure. IMPRESSIONS  1.  No vegetations.  2. Left ventricular ejection fraction, by estimation, is 65 to 70%. The left ventricle has normal function.  3. Right ventricular systolic function is normal. The right ventricular size is normal.  4. No left atrial/left atrial appendage thrombus was detected.  5. A small pericardial effusion is present. The pericardial effusion is circumferential.  6. The mitral valve is normal in structure. Trivial mitral valve regurgitation.  7. The aortic valve is normal in structure. Aortic valve regurgitation is not visualized.  8. Agitated saline contrast bubble study was positive with shunting observed after >6 cardiac cycles suggestive of intrapulmonary shunting. FINDINGS  Left Ventricle: Left ventricular ejection fraction, by estimation, is 65 to 70%. The left ventricle has normal function. The left ventricular internal cavity size was normal in size. Right Ventricle: The right ventricular size is normal. Right vetricular  wall thickness was not assessed. Right ventricular systolic function is normal. Left Atrium: Left atrial size was normal in size. No left atrial/left atrial appendage thrombus was detected. Right Atrium: Right atrial size was normal in size. Pericardium: A small pericardial effusion is present. The pericardial effusion is circumferential. Mitral Valve: The mitral valve is normal in structure. Trivial mitral valve regurgitation. Tricuspid Valve: The tricuspid valve is normal in structure. Tricuspid valve regurgitation is mild. Aortic Valve: The aortic valve is normal in structure. Aortic valve regurgitation is not visualized. Pulmonic Valve: The pulmonic valve was normal in structure. Pulmonic valve regurgitation is trivial. Aorta: The aortic root and ascending aorta are structurally normal, with no evidence of dilitation. IAS/Shunts: No atrial level shunt detected by color flow Doppler. Agitated saline contrast bubble study was positive with shunting observed after >6 cardiac cycles suggestive of intrapulmonary shunting. Dorris Carnes MD Electronically signed by Dorris Carnes MD Signature Date/Time: 10/02/2021/1:40:38 PM    Final    ? ?Assessment/Plan: ?Patient suffered large right ischemic MCA stroke, resulting in significant cerebral edema on 09/12/2021. Dr. Arnoldo Morale performed a right hemicraniectomy on 09/15/2021 for decompression.  ? ? LOS: 21 days  ? ?-Continue rehabilitation efforts. ?-Possibly replace his crani flap in a few weeks. ? ?Viona Gilmore, DNP, AGNP-C ?Nurse Practitioner ? ?[facility] ?[address], [address], [place], [place] [address] ?[id number]    [id number] ? ?10/03/2021, 9:34 AM ? ? ? ? ?

## 2021-10-04 DIAGNOSIS — I82451 Acute embolism and thrombosis of right peroneal vein: Secondary | ICD-10-CM

## 2021-10-04 DIAGNOSIS — I63411 Cerebral infarction due to embolism of right middle cerebral artery: Secondary | ICD-10-CM | POA: Diagnosis not present

## 2021-10-04 DIAGNOSIS — G936 Cerebral edema: Secondary | ICD-10-CM | POA: Diagnosis not present

## 2021-10-04 DIAGNOSIS — I639 Cerebral infarction, unspecified: Secondary | ICD-10-CM | POA: Diagnosis not present

## 2021-10-04 DIAGNOSIS — I63511 Cerebral infarction due to unspecified occlusion or stenosis of right middle cerebral artery: Secondary | ICD-10-CM | POA: Diagnosis not present

## 2021-10-04 LAB — CBC
HCT: 27.2 % — ABNORMAL LOW (ref 39.0–52.0)
Hemoglobin: 9 g/dL — ABNORMAL LOW (ref 13.0–17.0)
MCH: 28.8 pg (ref 26.0–34.0)
MCHC: 33.1 g/dL (ref 30.0–36.0)
MCV: 87.2 fL (ref 80.0–100.0)
Platelets: 378 10*3/uL (ref 150–400)
RBC: 3.12 MIL/uL — ABNORMAL LOW (ref 4.22–5.81)
RDW: 13.4 % (ref 11.5–15.5)
WBC: 10.9 10*3/uL — ABNORMAL HIGH (ref 4.0–10.5)
nRBC: 0 % (ref 0.0–0.2)

## 2021-10-04 LAB — GLUCOSE, CAPILLARY
Glucose-Capillary: 231 mg/dL — ABNORMAL HIGH (ref 70–99)
Glucose-Capillary: 257 mg/dL — ABNORMAL HIGH (ref 70–99)
Glucose-Capillary: 263 mg/dL — ABNORMAL HIGH (ref 70–99)
Glucose-Capillary: 266 mg/dL — ABNORMAL HIGH (ref 70–99)
Glucose-Capillary: 275 mg/dL — ABNORMAL HIGH (ref 70–99)
Glucose-Capillary: 351 mg/dL — ABNORMAL HIGH (ref 70–99)

## 2021-10-04 LAB — BASIC METABOLIC PANEL
Anion gap: 7 (ref 5–15)
BUN: 15 mg/dL (ref 6–20)
CO2: 25 mmol/L (ref 22–32)
Calcium: 8.2 mg/dL — ABNORMAL LOW (ref 8.9–10.3)
Chloride: 106 mmol/L (ref 98–111)
Creatinine, Ser: 0.81 mg/dL (ref 0.61–1.24)
GFR, Estimated: >60 mL/min (ref >60)
Glucose, Bld: 235 mg/dL — ABNORMAL HIGH (ref 70–99)
Potassium: 3.4 mmol/L — ABNORMAL LOW (ref 3.5–5.1)
Sodium: 138 mmol/L (ref 135–145)

## 2021-10-04 LAB — HEPARIN LEVEL (UNFRACTIONATED): Heparin Unfractionated: 0.54 IU/mL (ref 0.30–0.70)

## 2021-10-04 MED ORDER — HEPARIN (PORCINE) 25000 UT/250ML-% IV SOLN
2100.0000 [IU]/h | INTRAVENOUS | Status: DC
  Administered 2021-10-04: 1850 [IU]/h via INTRAVENOUS
  Administered 2021-10-05: 1800 [IU]/h via INTRAVENOUS
  Administered 2021-10-06: 2100 [IU]/h via INTRAVENOUS
  Filled 2021-10-04 (×4): qty 250

## 2021-10-04 NOTE — Progress Notes (Addendum)
:.  Show STROKE TEAM PROGRESS NOTE  ? ?INTERVAL HISTORY ?Patient is seen in his room with his wife at the bedside.  Patient remains hemodynamically stable with no acute events overnight. Headaches continue but PRN medications are helpful.  Will begin anticoagulation with heparin for right peroneal vein DVT today. ? ?Vitals:  ? 10/04/21 0248 10/04/21 0341 10/04/21 0820 10/04/21 0854  ?BP:  (!) 163/98    ?Pulse: 91 95  (!) 104  ?Resp:  (!) 29  (!) 25  ?Temp:  98.6 ?F (37 ?C) 98.8 ?F (37.1 ?C)   ?TempSrc:  Axillary Axillary   ?SpO2: 97% 97%  95%  ?Weight:      ?Height:      ? ?CBC:  ?Recent Labs  ?Lab 10/03/21 ?0302 10/04/21 ?[id number]  ?WBC 11.5* 10.9*  ?HGB 9.1* 9.0*  ?HCT 28.8* 27.2*  ?MCV 89.4 87.2  ?PLT 345 378  ? ? ?Basic Metabolic Panel:  ?Recent Labs  ?Lab 10/03/21 ?0302 10/04/21 ?[id number]  ?NA 140 138  ?K 3.2* 3.4*  ?CL 106 106  ?CO2 26 25  ?GLUCOSE 177* 235*  ?BUN 15 15  ?CREATININE 0.77 0.81  ?CALCIUM 8.3* 8.2*  ? ? ?Lipid Panel:  ?No results for input(s): CHOL, TRIG, HDL, CHOLHDL, VLDL, LDLCALC in the last 168 hours. ? ?HgbA1c:  ?No results for input(s): HGBA1C in the last 168 hours. ? ?Urine Drug Screen:  ?No results for input(s): LABOPIA, COCAINSCRNUR, LABBENZ, AMPHETMU, THCU, LABBARB in the last 168 hours. ?  ?Alcohol Level No results for input(s): ETH in the last 168 hours. ? ?IMAGING past 24 hours ?No results found. ? ?PHYSICAL EXAM ? ?Physical Exam  ?Constitutional: Appears well-developed and well-nourished middle-aged African-American male, hemi craniectomy surgical incision  ?Cardiovascular: Normal rate and regular rhythm.  ?Respiratory: Respirations regular and unlabored on trach collar ? ?Neuro (s/p trach): Sleepy but can be aroused and interactive.  Right gaze deviation, blink to threat. Following commands on the right and midline.  PERRL, cough and gag reflexes present.  Dense left hemiplegia.  Antigravity strength on the right ? ?ASSESSMENT/PLAN ?Jacob [name] is a 46 y.o. male with history of HTN,  DM2 presenting with left side facial droop, left arm weakness. Received TNKase. Neuro status declined during MRI (NIHSS 13),taken for thrombectomy with Dr. Karenann Cai. TICI3 recanalization of the right M2/MCA achieved.   ? ?Stroke:  Right MCA due to right MCA occlusion  s/p TNK and IR with TICI3, etiology not certain, but most likely cryptogenic versus intracranial atherosclerosis. ?Code Stroke CT head No acute abnormality ?CTA head & neck R short segment M1 occlusion, left distal M1 high grade stenosis ?Post IR CT No hemorrhagic complication on flat panel CT. ?4/21- Early MRI Tiny acute infarcts in the upper division right MCA cortex. ?4/22- MRI right MCA territory infarct involving the cortex, sparing the basal ganglia.  2 mm midline shift.  No hemorrhage ?4/22- MRA reoccluded right M2 branch, intracranial atherosclerosis including advanced left M1 stenosis ?4/24-  Head CT-increased edema resulting in near complete effacement of the right lateral ventricle and 12 mm leftward midline shift ?4/25- Head CT Post OP- 5 mm left midline shift, new area of hypoattenuation of the left middle cerebellar peduncle and cerebellum ?4/28 head CT large right MCA infarct with no HT, diminished mass effect with no midline shift, hemicraniectomy noted ?5/3 CT head stable, no midline shift, large right MCA infarct ?2D Echo EF > 75% ?TEE- Consistent with intrapulmonary shunt ?LDL 96 ?HgbA1c 8.1 ?UDS negative ?  VTE prophylaxis - SCDs ?No antithrombotic prior to admission, now on IV heparin due to DVT.  ?Therapy recommendations: PT/OT at LTAC vs. CIR ?disposition:  Pending ? ?Cerebral Edema s/p [facility] ?Head CT 4/24 shows increased edema with new 21mm midline shift ?Decompressive hemicraniectomy performed 4/24 by neurosurgery ?Remove staples on postop day 10 ?Neurosurgery plans to replace bone flap when swelling subsides, bone flap in abdomen ?No midline shift seen on head CT 4/28 and 5/3 ?Na stable now ?Off HTS  ?On keppra 500mg   bid, duration per NSG ? ?Intracranial stenosis ?CTA head & neck R short segment M1 occlusion, left distal M1 high grade stenosis ?No significant extracranial stenosis ?Most likely related to long standing uncontrolled risk factors. ? ?Right Peroneal DVT ?High risk for thrombosis proximal extension ?Low risk of bleeding at this time ?Started on IV heparin ?Once tolerating IV heparin, will switch to DOAC ? ?Hypertension ?Home meds:  Amlodipine 10mg , lisinopril 10mg -resumed ?Stable ?BP goal <160  ?On coreg 25, amlodipine 10, clonidine 0.2 Q8, lisinopril 40  ?PRN labetalol and hydralazine ?Long-term BP goal normotensive ? ?Hyperlipidemia ?Home meds:  Atorvastatin 20mg  ?LDL 96, goal < 70 ?Put on lipitor 40 ?Continue statin at discharge ? ?Diabetes type II Uncontrolled ?Home meds:  Glipizide, metformin ?HgbA1c 8.1, goal < 7.0 ?CBGs ?SSI with resist scale, Levemir 11->40 units twice daily ?DM coordinator consulted ? ?Other Stroke Risk Factors ?Obesity, Body mass index is 30.98 kg/m?., BMI >/= 30 associated with increased stroke risk, recommend weight loss, diet and exercise as appropriate  ? ?Dysphagia  ?Ileus, resolved ?Cortrak in place ?TF at 62ml/hr, prosource BID ? ?Respiratory failure ?Patient left intubated after surgery ?Ventilator management per CCM ?Tracheostomy placed 5/1 ?On trach collar  ? ?Fever and leukocytosis ?Body rash ?Initial blood culture showed staph, now no growth as of 5/6 ?Afebrile now ?WBC 12.3-14.5-18.5-17.2 -> 13.9->10.7-> 13.1 -> 10.8-> 11.5-> 10.9 ?Body rash - not chicken pox - isolation lifted ?VZV IgM-negative ?VZV PCR negative ?Tracheal aspirate + MRSA -> On vancomycin ->Switch to PO linezolid - continue for 2 weeks from negative culture on 5/6 (end date 10/11/2021) ? ?Post stroke depression ?started lexapro. ? ? ?Hospital day # 22 ? ?Patient seen and examined by NP/APP with MD. MD to update note as needed.  ? ?[place] , MSN, AGACNP-BC ?[facility] ?See Amion for  schedule and pager information ?10/04/2021 12:19 PM ? ?ATTENDING NOTE: ?I reviewed above note and agree with the assessment and plan. Pt was seen and examined.  ? ?No family at bedside.  Patient lying in bed, mildly lethargic but open eyes easily on voice, able to maintain wakefulness, on trach collar. Nonverbal with trach, but following all simple commands on the right. Right gaze preference, not able to cross midline, tracking on the right, not blinking to visual threat on the left, PERRL. Left facial droop. Tongue midline. RUE and RLE at least 4/5, LUE and LLE flaccid. Sensation symmetrical bilaterally subjectively, FTN slow but intact on the right, gait not tested.  ? ?Patient clinically stable, PT/OT recommend LTAC versus CIR. was on aspirin 81, however, due to right LE distal DVT, high risk for proximal extension and low risk of bleeding, will start heparin IV today, once tolerating, will switch to DOAC.  Continue tube feeding and antibiotics per ID. ? ?For detailed assessment and plan, please refer to above as I have made changes wherever appropriate.  ? ?Rosalin Hawking, MD PhD ?Stroke Neurology ?10/04/2021 ?1:33 PM ? ? ?  ? ?To  contact Stroke Continuity provider, please refer to [email or web address] ?After hours, contact General Neurology ? ?

## 2021-10-04 NOTE — Progress Notes (Signed)
ANTICOAGULATION CONSULT NOTE - Initial Consult ? ?Pharmacy Consult for heparin  ?Indication: DVT ? ?Allergies  ?Allergen Reactions  ? Hydrochlorothiazide Other (See Comments)  ?  Bilateral muscle cramping  ? ? ?Patient Measurements: ?Height: 6\' 1"  (185.4 cm) ?Weight: 106.5 kg (234 lb 12.6 oz) ?IBW/kg (Calculated) : 79.9 ?Heparin Dosing Weight: 102 kg  ? ?Vital Signs: ?Temp: 98.6 ?F (37 ?C) (05/13 0341) ?Temp Source: Axillary (05/13 0341) ?BP: 163/98 (05/13 0341) ?Pulse Rate: 104 (05/13 0854) ? ?Labs: ?Recent Labs  ?  10/02/21 ?[id number] 10/03/21 ?0302 10/04/21 ?[id number]  ?HGB 8.7* 9.1* 9.0*  ?HCT 26.5* 28.8* 27.2*  ?PLT 403* 345 378  ?CREATININE 0.63 0.77 0.81  ? ? ?Estimated Creatinine Clearance: 142.8 mL/min (by C-G formula based on SCr of 0.81 mg/dL). ? ? ?Medical History: ?Past Medical History:  ?Diagnosis Date  ? Allergy   ? Diabetes mellitus without complication ([place])   ? Hypertension   ? ? ? ?Assessment: ?34 YOM presented with stroke requiring administration of TNK and thrombectomy 4/21. Patient noted to have acute CVT involving right peroneal veins on 5/12. Pharmacy has been consulted for heparin dosing using targeting a lower end of the therapeutic range given recent stroke.  ? ?Hgb 9.0, PLT WNL.  ? ?Goal of Therapy:  ?Heparin level 0.3-0.5  ?Monitor platelets by anticoagulation protocol: Yes ?  ?Plan:  ?STOP Enoxaparin prophylaxis  ?START heparin infusion at 1,850 units/hour (no bolus)  ?Daily CBC and heparin level  ?1630 Heparin Level  ?Monitor for Signs and symptoms of bleeding  ? ? ?Adria Dill, PharmD ?PGY-1 Acute Care Resident  ?10/04/2021 9:36 AM  ? ? ? ?

## 2021-10-04 NOTE — Progress Notes (Signed)
ANTICOAGULATION CONSULT NOTE  ? ?Pharmacy Consult for heparin  ?Indication: DVT ? ?Allergies  ?Allergen Reactions  ? Hydrochlorothiazide Other (See Comments)  ?  Bilateral muscle cramping  ? ? ?Patient Measurements: ?Height: 6\' 1"  (185.4 cm) ?Weight: 106.5 kg (234 lb 12.6 oz) ?IBW/kg (Calculated) : 79.9 ?Heparin Dosing Weight: 102 kg  ? ?Vital Signs: ?Temp: 98.7 ?F (37.1 ?C) (05/13 1220) ?Temp Source: Oral (05/13 1320) ?BP: 129/84 (05/13 1536) ?Pulse Rate: 89 (05/13 1536) ? ?Labs: ?Recent Labs  ?  10/02/21 ?[id number] 10/03/21 ?0302 10/04/21 ?[id number] 10/04/21 ?1619  ?HGB 8.7* 9.1* 9.0*  --   ?HCT 26.5* 28.8* 27.2*  --   ?PLT 403* 345 378  --   ?HEPARINUNFRC  --   --   --  0.54  ?CREATININE 0.63 0.77 0.81  --   ? ? ? ?Estimated Creatinine Clearance: 142.8 mL/min (by C-G formula based on SCr of 0.81 mg/dL). ? ? ?Medical History: ?Past Medical History:  ?Diagnosis Date  ? Allergy   ? Diabetes mellitus without complication ([place])   ? Hypertension   ? ? ? ?Assessment: ?64 YOM presented with stroke requiring administration of TNK and thrombectomy 4/21. Patient noted to have acute CVT involving right peroneal veins on 5/12. Pharmacy has been consulted for heparin dosing using targeting a lower end of the therapeutic range given recent stroke.  ? ?-heparin level = 0.54 ? ?Goal of Therapy:  ?Heparin level 0.3-0.5  ?Monitor platelets by anticoagulation protocol: Yes ?  ?Plan:  ?-Decrease heparin to 1800 units/hr ?-Heparin level and CBC in am ? ?Hildred Laser, PharmD ?Clinical Pharmacist ?**Pharmacist phone directory can now be found on [email or web address] ([email or web address]).  Listed under [place]. ? ? ? ? ?

## 2021-10-05 DIAGNOSIS — I639 Cerebral infarction, unspecified: Secondary | ICD-10-CM | POA: Diagnosis not present

## 2021-10-05 DIAGNOSIS — E7800 Pure hypercholesterolemia, unspecified: Secondary | ICD-10-CM

## 2021-10-05 DIAGNOSIS — G936 Cerebral edema: Secondary | ICD-10-CM | POA: Diagnosis not present

## 2021-10-05 DIAGNOSIS — I63511 Cerebral infarction due to unspecified occlusion or stenosis of right middle cerebral artery: Secondary | ICD-10-CM | POA: Diagnosis not present

## 2021-10-05 DIAGNOSIS — I63411 Cerebral infarction due to embolism of right middle cerebral artery: Secondary | ICD-10-CM | POA: Diagnosis not present

## 2021-10-05 LAB — GLUCOSE, CAPILLARY
Glucose-Capillary: 210 mg/dL — ABNORMAL HIGH (ref 70–99)
Glucose-Capillary: 230 mg/dL — ABNORMAL HIGH (ref 70–99)
Glucose-Capillary: 233 mg/dL — ABNORMAL HIGH (ref 70–99)
Glucose-Capillary: 247 mg/dL — ABNORMAL HIGH (ref 70–99)
Glucose-Capillary: 255 mg/dL — ABNORMAL HIGH (ref 70–99)
Glucose-Capillary: 265 mg/dL — ABNORMAL HIGH (ref 70–99)

## 2021-10-05 LAB — HEPARIN LEVEL (UNFRACTIONATED)
Heparin Unfractionated: 0.52 IU/mL (ref 0.30–0.70)
Heparin Unfractionated: 0.18 IU/mL — ABNORMAL LOW (ref 0.30–0.70)
Heparin Unfractionated: 0.21 IU/mL — ABNORMAL LOW (ref 0.30–0.70)

## 2021-10-05 LAB — BASIC METABOLIC PANEL
Anion gap: 7 (ref 5–15)
BUN: 16 mg/dL (ref 6–20)
CO2: 25 mmol/L (ref 22–32)
Calcium: 8.2 mg/dL — ABNORMAL LOW (ref 8.9–10.3)
Chloride: 106 mmol/L (ref 98–111)
Creatinine, Ser: 0.86 mg/dL (ref 0.61–1.24)
GFR, Estimated: >60 mL/min (ref >60)
Glucose, Bld: 267 mg/dL — ABNORMAL HIGH (ref 70–99)
Potassium: 3.6 mmol/L (ref 3.5–5.1)
Sodium: 138 mmol/L (ref 135–145)

## 2021-10-05 LAB — CBC
HCT: 26.6 % — ABNORMAL LOW (ref 39.0–52.0)
Hemoglobin: 8.6 g/dL — ABNORMAL LOW (ref 13.0–17.0)
MCH: 28.5 pg (ref 26.0–34.0)
MCHC: 32.3 g/dL (ref 30.0–36.0)
MCV: 88.1 fL (ref 80.0–100.0)
Platelets: 354 10*3/uL (ref 150–400)
RBC: 3.02 MIL/uL — ABNORMAL LOW (ref 4.22–5.81)
RDW: 13.3 % (ref 11.5–15.5)
WBC: 11.4 10*3/uL — ABNORMAL HIGH (ref 4.0–10.5)
nRBC: 0 % (ref 0.0–0.2)

## 2021-10-05 MED ORDER — INSULIN ASPART 100 UNIT/ML IJ SOLN
5.0000 [IU] | INTRAMUSCULAR | Status: DC
  Administered 2021-10-05 – 2021-10-06 (×9): 5 [IU] via SUBCUTANEOUS

## 2021-10-05 MED ORDER — INSULIN DETEMIR 100 UNIT/ML ~~LOC~~ SOLN
40.0000 [IU] | Freq: Two times a day (BID) | SUBCUTANEOUS | Status: DC
  Administered 2021-10-05 – 2021-10-06 (×3): 40 [IU] via SUBCUTANEOUS
  Filled 2021-10-05 (×4): qty 0.4

## 2021-10-05 NOTE — Progress Notes (Addendum)
:.  Show STROKE TEAM PROGRESS NOTE  ? ?INTERVAL HISTORY ?Patient is seen in his room with his wife at the bedside.  Patient remains hemodynamically stable with no acute events overnight. Tired yesterday from oxy, but more alert today. Topamax 50mg  BID for headaches as well as PRNs.  Heparin IV for right peroneal vein DVT continue.  ? ?Vitals:  ? 10/05/21 0719 10/05/21 0834 10/05/21 0900 10/05/21 1119  ?BP: 136/90   (!) 143/94  ?Pulse: 84 86    ?Resp: (!) 24 (!) 29 (!) 24   ?Temp: 99.3 ?F (37.4 ?C)     ?TempSrc: Axillary     ?SpO2: 98%     ?Weight:      ?Height:      ? ?CBC:  ?Recent Labs  ?Lab 10/04/21 ?[id number] 10/05/21 ?0248  ?WBC 10.9* 11.4*  ?HGB 9.0* 8.6*  ?HCT 27.2* 26.6*  ?MCV 87.2 88.1  ?PLT 378 354  ? ? ?Basic Metabolic Panel:  ?Recent Labs  ?Lab 10/04/21 ?[id number] 10/05/21 ?0248  ?NA 138 138  ?K 3.4* 3.6  ?CL 106 106  ?CO2 25 25  ?GLUCOSE 235* 267*  ?BUN 15 16  ?CREATININE 0.81 0.86  ?CALCIUM 8.2* 8.2*  ? ? ?Lipid Panel:  ?No results for input(s): CHOL, TRIG, HDL, CHOLHDL, VLDL, LDLCALC in the last 168 hours. ? ?HgbA1c:  ?No results for input(s): HGBA1C in the last 168 hours. ? ?Urine Drug Screen:  ?No results for input(s): LABOPIA, COCAINSCRNUR, LABBENZ, AMPHETMU, THCU, LABBARB in the last 168 hours. ?  ?Alcohol Level No results for input(s): ETH in the last 168 hours. ? ?IMAGING past 24 hours ?No results found. ? ?PHYSICAL EXAM ? ?Physical Exam  ?Constitutional: Appears well-developed and well-nourished middle-aged African-American male, hemi craniectomy surgical incision  ?Cardiovascular: Normal rate and regular rhythm.  ?Respiratory: Respirations regular and unlabored on trach collar ? ?Neuro (s/p trach): Sleepy but can be aroused and interactive.  Right gaze deviation, blink to threat. Following commands on the right and midline.  PERRL, cough and gag reflexes present.  Dense left hemiplegia.  Antigravity strength on the right ? ?ASSESSMENT/PLAN ?Mr. DERRILL SURTI is a 46 y.o. male with history of HTN, DM2  presenting with left side facial droop, left arm weakness. Received TNKase. Neuro status declined during MRI (NIHSS 13),taken for thrombectomy with Dr. Karenann Cai. TICI3 recanalization of the right M2/MCA achieved.   ? ?Stroke:  Right MCA due to right MCA occlusion  s/p TNK and IR with TICI3, etiology not certain, but most likely cryptogenic versus intracranial atherosclerosis. ?Code Stroke CT head No acute abnormality ?CTA head & neck R short segment M1 occlusion, left distal M1 high grade stenosis ?Post IR CT No hemorrhagic complication on flat panel CT. ?4/21- Early MRI Tiny acute infarcts in the upper division right MCA cortex. ?4/22- MRI right MCA territory infarct involving the cortex, sparing the basal ganglia.  2 mm midline shift.  No hemorrhage ?4/22- MRA reoccluded right M2 branch, intracranial atherosclerosis including advanced left M1 stenosis ?4/24-  Head CT-increased edema resulting in near complete effacement of the right lateral ventricle and 12 mm leftward midline shift ?4/25- Head CT Post OP- 5 mm left midline shift, new area of hypoattenuation of the left middle cerebellar peduncle and cerebellum ?4/28 head CT large right MCA infarct with no HT, diminished mass effect with no midline shift, hemicraniectomy noted ?5/3 CT head stable, no midline shift, large right MCA infarct ?2D Echo EF > 75% ?TEE- Consistent with intrapulmonary shunt ?LDL  96 ?HgbA1c 8.1 ?UDS negative ?VTE prophylaxis - SCDs ?No antithrombotic prior to admission, now on IV heparin due to DVT.  ?Therapy recommendations: LTAC vs. CIR ?disposition:  Pending ? ?Cerebral Edema s/p [facility] ?Head CT 4/24 shows increased edema with new 18mm midline shift ?Decompressive hemicraniectomy performed 4/24 by neurosurgery ?Remove staples on postop day 10 ?Neurosurgery plans to replace bone flap when swelling subsides, bone flap in abdomen ?No midline shift seen on head CT 4/28 and 5/3 ?Na stable now ?Off HTS  ?On keppra 500mg  bid, duration  per NSG ? ?Intracranial stenosis ?CTA head & neck R short segment M1 occlusion, left distal M1 high grade stenosis ?No significant extracranial stenosis ?Most likely related to long standing uncontrolled risk factors. ? ?Right Peroneal DVT ?High risk for thrombosis proximal extension ?Low risk of bleeding at this time ?Started on IV heparin ?Once tolerating IV heparin, will switch to DOAC ? ?Hypertension ?Home meds:  Amlodipine 10mg , lisinopril 10mg -resumed ?Stable ?BP goal <160  ?On coreg 25, amlodipine 10, clonidine 0.2 Q8, lisinopril 40  ?PRN labetalol and hydralazine ?Long-term BP goal normotensive ? ?Hyperlipidemia ?Home meds:  Atorvastatin 20mg  ?LDL 96, goal < 70 ?Put on lipitor 40 ?Continue statin at discharge ? ?Diabetes type II Uncontrolled ?Home meds:  Glipizide, metformin ?HgbA1c 8.1, goal < 7.0 ?CBGs ?Still has hyperglycemia ?SSI with resist scale, Levemir increase to 40 units twice daily ?Novolog increase to 5u q 4hr plus slide ?DM coordinator consulted ? ?Other Stroke Risk Factors ?Obesity, Body mass index is 30.98 kg/m?., BMI >/= 30 associated with increased stroke risk, recommend weight loss, diet and exercise as appropriate  ? ?Dysphagia  ?Ileus, resolved ?Cortrak in place ?TF at 50ml/hr, prosource BID ? ?Respiratory failure ?Patient left intubated after surgery ?Ventilator management per CCM ?Tracheostomy placed 5/1 ?On trach collar  ? ?Fever and leukocytosis ?Body rash ?Initial blood culture showed staph, now no growth as of 5/6 ?Afebrile now ?WBC 12.3-14.5-18.5-17.2 -> 13.9->10.7-> 13.1 -> 10.8-> 11.5-> 10.9 -> 11.4 ?Body rash - not chicken pox - isolation lifted ?VZV IgM-negative ?VZV PCR negative ?Tracheal aspirate + MRSA -> On vancomycin ->Switch to PO linezolid - continue for 2 weeks from negative culture on 5/6 (end date 10/11/2021) ? ?Post stroke depression ?started lexapro. ? ?Headaches ?Topamax 50mg  BID ?PRN oxycodone q4hrs ? ? ?Hospital day # 23 ? ? ?Patient seen and examined by NP/APP  with MD. MD to update note as needed.  ? ?Janine Ores, DNP, FNP-BC ?[facility] ?Pager: [phone] ? ?ATTENDING NOTE: ?I reviewed above note and agree with the assessment and plan. Pt was seen and examined.  ? ?Wife at the bedside, suctioning trach for patient.  Patient lying in bed, eyes open, not comfortable with trach suctioning, not quite interactive after trach suctioning.  However, follow simple commands on the right.  Still has left hemiplegia, flaccid.  Wife educated on helping patient passive movement on the left.  ? ?Patient still has hyperglycemia, DM coordinator consulted, increased Premeal and long-acting insulin dose.  Currently on heparin IV for DVT, tolerating well.  Still on tube feeding.  Continue Zyvox and Lipitor.  PT/OT recommend LTAC versus CIR. ? ?For detailed assessment and plan, please refer to above as I have made changes wherever appropriate.  ? ?Rosalin Hawking, MD PhD ?Stroke Neurology ?10/05/2021 ?5:34 PM ? ?  ? ?To contact Stroke Continuity provider, please refer to [email or web address] ?After hours, contact General Neurology ? ?

## 2021-10-05 NOTE — Progress Notes (Signed)
ANTICOAGULATION CONSULT NOTE  ? ?Pharmacy Consult for heparin  ?Indication: DVT ? ?Allergies  ?Allergen Reactions  ? Hydrochlorothiazide Other (See Comments)  ?  Bilateral muscle cramping  ? ? ?Patient Measurements: ?Height: 6\' 1"  (185.4 cm) ?Weight: 106.5 kg (234 lb 12.6 oz) ?IBW/kg (Calculated) : 79.9 ?Heparin Dosing Weight: 102 kg  ? ?Vital Signs: ?Temp: 99.3 ?F (37.4 ?C) (05/14 0719) ?Temp Source: Axillary (05/14 0719) ?BP: 136/90 (05/14 0719) ?Pulse Rate: 84 (05/14 0719) ? ?Labs: ?Recent Labs  ?  10/03/21 ?0302 10/04/21 ?[id number] 10/04/21 ?1619 10/05/21 ?0248  ?HGB 9.1* 9.0*  --  8.6*  ?HCT 28.8* 27.2*  --  26.6*  ?PLT 345 378  --  354  ?HEPARINUNFRC  --   --  0.54 0.52  ?CREATININE 0.77 0.81  --  0.86  ? ? ? ?Estimated Creatinine Clearance: 134.5 mL/min (by C-G formula based on SCr of 0.86 mg/dL). ? ? ?Medical History: ?Past Medical History:  ?Diagnosis Date  ? Allergy   ? Diabetes mellitus without complication ([place])   ? Hypertension   ? ? ? ?Assessment: ?76 YOM presented with stroke requiring administration of TNK and thrombectomy 4/21. Patient noted to have acute CVT involving right peroneal veins on 5/12. Pharmacy has been consulted for heparin dosing using targeting a lower end of the therapeutic range given recent stroke.  ? ? ?Heparin level remains above goal range 5/14 AM with heparin running at 1800 units/h. No issue wit hinfusion or bleeding noted. Hgb stable 8s-9s. PLT WNL.  ? ?Goal of Therapy:  ?Heparin level 0.3-0.5  ?Monitor platelets by anticoagulation protocol: Yes ?  ?Plan:  ?- Decrease heparin to 1700 units/hr ?- Repeat 6h heparin level  ?- Daily heparin level and CBC  ? ?Adria Dill, PharmD ?PGY-1 Acute Care Resident  ?10/05/2021 8:05 AM  ? ? ? ? ? ?

## 2021-10-05 NOTE — Progress Notes (Addendum)
ANTICOAGULATION CONSULT NOTE  ? ?Pharmacy Consult for heparin  ?Indication: DVT ? ?Allergies  ?Allergen Reactions  ? Hydrochlorothiazide Other (See Comments)  ?  Bilateral muscle cramping  ? ? ?Patient Measurements: ?Height: 6\' 1"  (185.4 cm) ?Weight: 106.5 kg (234 lb 12.6 oz) ?IBW/kg (Calculated) : 79.9 ?Heparin Dosing Weight: 102 kg  ? ?Vital Signs: ?Temp: 99.3 ?F (37.4 ?C) (05/14 0719) ?Temp Source: Axillary (05/14 0719) ?BP: 135/85 (05/14 1509) ?Pulse Rate: 86 (05/14 0834) ? ?Labs: ?Recent Labs  ?  10/03/21 ?0302 10/04/21 ?[id number] 10/04/21 ?1619 10/05/21 ?[id number] 10/05/21 ?1430  ?HGB 9.1* 9.0*  --  8.6*  --   ?HCT 28.8* 27.2*  --  26.6*  --   ?PLT 345 378  --  354  --   ?HEPARINUNFRC  --   --  0.54 0.52 0.18*  ?CREATININE 0.77 0.81  --  0.86  --   ? ? ? ?Estimated Creatinine Clearance: 134.5 mL/min (by C-G formula based on SCr of 0.86 mg/dL). ? ? ?Medical History: ?Past Medical History:  ?Diagnosis Date  ? Allergy   ? Diabetes mellitus without complication ([place])   ? Hypertension   ? ? ? ?Assessment: ?28 YOM presented with stroke requiring administration of TNK and thrombectomy 4/21. Patient noted to have acute CVT involving right peroneal veins on 5/12. Pharmacy has been consulted for heparin dosing using targeting a lower end of the therapeutic range given recent stroke.  ? ? ?Heparin level remains above goal range 5/14 AM with heparin running at 1800 units/h. No issue wit hinfusion or bleeding noted. Hgb stable 8s-9s. PLT WNL.  ? ?Heparin level came back subtherapeutic after dose decrease this AM. Level this AM could be affected by Lovenox 40 from yesterday. We will increase rate and check another 6 hr HL.  ? ?Goal of Therapy:  ?Heparin level 0.3-0.5  ?Monitor platelets by anticoagulation protocol: Yes ?  ?Plan:  ?- Increase heparin to 1850 units/hr ?- Repeat 6h heparin level  ?- Daily heparin level and CBC  ? ?Onnie Boer, PharmD, BCIDP, AAHIVP, CPP ?Infectious Disease Pharmacist ?10/05/2021 3:29 PM ? ? ? ? ? ? ? ?

## 2021-10-06 DIAGNOSIS — I63411 Cerebral infarction due to embolism of right middle cerebral artery: Secondary | ICD-10-CM | POA: Diagnosis not present

## 2021-10-06 DIAGNOSIS — I639 Cerebral infarction, unspecified: Secondary | ICD-10-CM | POA: Diagnosis not present

## 2021-10-06 DIAGNOSIS — I10 Essential (primary) hypertension: Secondary | ICD-10-CM | POA: Diagnosis not present

## 2021-10-06 DIAGNOSIS — I63511 Cerebral infarction due to unspecified occlusion or stenosis of right middle cerebral artery: Secondary | ICD-10-CM | POA: Diagnosis not present

## 2021-10-06 LAB — GLUCOSE, CAPILLARY
Glucose-Capillary: 188 mg/dL — ABNORMAL HIGH (ref 70–99)
Glucose-Capillary: 225 mg/dL — ABNORMAL HIGH (ref 70–99)
Glucose-Capillary: 235 mg/dL — ABNORMAL HIGH (ref 70–99)
Glucose-Capillary: 252 mg/dL — ABNORMAL HIGH (ref 70–99)
Glucose-Capillary: 256 mg/dL — ABNORMAL HIGH (ref 70–99)
Glucose-Capillary: 268 mg/dL — ABNORMAL HIGH (ref 70–99)

## 2021-10-06 LAB — CBC
HCT: 28.4 % — ABNORMAL LOW (ref 39.0–52.0)
Hemoglobin: 9.3 g/dL — ABNORMAL LOW (ref 13.0–17.0)
MCH: 28.9 pg (ref 26.0–34.0)
MCHC: 32.7 g/dL (ref 30.0–36.0)
MCV: 88.2 fL (ref 80.0–100.0)
Platelets: 342 10*3/uL (ref 150–400)
RBC: 3.22 MIL/uL — ABNORMAL LOW (ref 4.22–5.81)
RDW: 13.2 % (ref 11.5–15.5)
WBC: 12.1 10*3/uL — ABNORMAL HIGH (ref 4.0–10.5)
nRBC: 0 % (ref 0.0–0.2)

## 2021-10-06 LAB — HEPARIN LEVEL (UNFRACTIONATED): Heparin Unfractionated: 0.55 IU/mL (ref 0.30–0.70)

## 2021-10-06 LAB — BASIC METABOLIC PANEL
Anion gap: 8 (ref 5–15)
BUN: 15 mg/dL (ref 6–20)
CO2: 23 mmol/L (ref 22–32)
Calcium: 8.5 mg/dL — ABNORMAL LOW (ref 8.9–10.3)
Chloride: 104 mmol/L (ref 98–111)
Creatinine, Ser: 0.79 mg/dL (ref 0.61–1.24)
GFR, Estimated: >60 mL/min (ref >60)
Glucose, Bld: 266 mg/dL — ABNORMAL HIGH (ref 70–99)
Potassium: 3.8 mmol/L (ref 3.5–5.1)
Sodium: 135 mmol/L (ref 135–145)

## 2021-10-06 MED ORDER — APIXABAN 5 MG PO TABS
5.0000 mg | ORAL_TABLET | Freq: Two times a day (BID) | ORAL | Status: DC
  Administered 2021-10-06 – 2021-10-08 (×5): 5 mg via ORAL
  Filled 2021-10-06 (×5): qty 1

## 2021-10-06 MED ORDER — INSULIN DETEMIR 100 UNIT/ML ~~LOC~~ SOLN: 55.0000 [IU] | Freq: Two times a day (BID) | SUBCUTANEOUS | Status: DC

## 2021-10-06 MED ORDER — INSULIN DETEMIR 100 UNIT/ML ~~LOC~~ SOLN
50.0000 [IU] | Freq: Two times a day (BID) | SUBCUTANEOUS | Status: DC
  Filled 2021-10-06: qty 0.5

## 2021-10-06 MED ORDER — INSULIN DETEMIR 100 UNIT/ML ~~LOC~~ SOLN
50.0000 [IU] | Freq: Two times a day (BID) | SUBCUTANEOUS | Status: DC
  Administered 2021-10-06 – 2021-10-08 (×4): 50 [IU] via SUBCUTANEOUS
  Filled 2021-10-06 (×5): qty 0.5

## 2021-10-06 MED ORDER — INSULIN ASPART 100 UNIT/ML IJ SOLN
6.0000 [IU] | INTRAMUSCULAR | Status: DC
  Administered 2021-10-06 – 2021-10-07 (×5): 6 [IU] via SUBCUTANEOUS

## 2021-10-06 MED ORDER — ORAL CARE MOUTH RINSE
15.0000 mL | Freq: Two times a day (BID) | OROMUCOSAL | Status: DC
  Administered 2021-10-06 – 2021-10-08 (×5): 15 mL via OROMUCOSAL

## 2021-10-06 MED ORDER — INSULIN ASPART 100 UNIT/ML IJ SOLN: 8.0000 [IU] | INTRAMUSCULAR | Status: DC

## 2021-10-06 NOTE — Progress Notes (Addendum)
Occupational Therapy Treatment Patient Details Name: Jacob [name] MRN: [id number] DOB: 07-Jan-1976 Today's Date: 10/06/2021   History of present illness 46 y.o. male presents to [facility] on 09/12/2021 with L facial droop and arm weakness. CTA revealed R M1 occlusion. Pt received tNK. Pt underwent thrombectomy on 4/21. Underwent R frontotemporal parietal hemicraniectomy on 4/24 after declining 4/23. CT + Right MCA/PCA infarct. Trach placement 09/22/2021. Found DVT at right peroneal veins. on 5/12. PMH includes HTN and DM.   OT comments  Pt making slow progress towards goals this session, requiring max-total A for LB ADL and bed mobility. Pt max-total A +2 for bed mobility. Pt min-max A for seated grooming task and UB bathing. Pt requiring hand over hand assistance to initiate grooming task, max A to wash RUE. Pt able to perform WB x3 on L elbow with mod A, donned occlusion glasses at end of session to facilitate midline gaze. Pt LUE positioned on pillow for support and edema mgmt at end of session. Pt presenting with impairments listed below, will follow acutely. Continue to recommend LTACH at d/c.   Recommendations for follow up therapy are one component of a multi-disciplinary discharge planning process, led by the attending physician.  Recommendations may be updated based on patient status, additional functional criteria and insurance authorization.    Follow Up Recommendations  OT at Long-term acute care hospital    Assistance Recommended at Discharge Frequent or constant Supervision/Assistance  Patient can return home with the following  Two people to help with walking and/or transfers;Two people to help with bathing/dressing/bathroom;Assistance with cooking/housework;Assistance with feeding;Direct supervision/assist for medications management;Direct supervision/assist for financial management;Assist for transportation;Help with stairs or ramp for entrance   Equipment Recommendations   Wheelchair (measurements OT);Wheelchair cushion (measurements OT);Hospital bed    Recommendations for Other Services Rehab consult    Precautions / Restrictions Precautions Precautions: Fall Precaution Comments: trach, R crani with bone flap in abdomen Restrictions Weight Bearing Restrictions: No       Mobility Bed Mobility Overal bed mobility: Needs Assistance Bed Mobility: Supine to Sit, Sit to Supine     Supine to sit: Max assist, +2 for physical assistance Sit to supine: Total assist, +2 for physical assistance, +2 for safety/equipment        Transfers Overall transfer level: Needs assistance                 General transfer comment: did not attempted due to lethargy and dense L hemipelgia     Balance Overall balance assessment: Needs assistance Sitting-balance support: Feet supported, Single extremity supported Sitting balance-Leahy Scale: Poor Sitting balance - Comments: pt pushing toward L side during session, mod A to recorrect, possibly due to pain vs. impaired proprioception/equilibrium Postural control: Posterior lean, Left lateral lean                                 ADL either performed or assessed with clinical judgement   ADL Overall ADL's : Needs assistance/impaired     Grooming: Minimal assistance;Sitting Grooming Details (indicate cue type and reason): required initial hand over hand assistance to bring washcloth to face for grooming Upper Body Bathing: Sitting;Maximal assistance Upper Body Bathing Details (indicate cue type and reason): hand over hand assistance to wash LUE         Lower Body Dressing: Total assistance Lower Body Dressing Details (indicate cue type and reason): to don socks at bed level  Functional mobility during ADLs: Maximal assistance;+2 for physical assistance      Extremity/Trunk Assessment Upper Extremity Assessment Upper Extremity Assessment: RUE deficits/detail RUE Deficits /  Details: uses RUE during session for functional ADL as well as to hold bedrail/bed to remain upright LUE Deficits / Details: edema noted, PROM WFL LUE Sensation: decreased proprioception;decreased light touch LUE Coordination: decreased fine motor;decreased gross motor   Lower Extremity Assessment Lower Extremity Assessment: Defer to PT evaluation        Vision   Vision Assessment?: Vision impaired- to be further tested in functional context Ocular Range of Motion: Restricted on the left Alignment/Gaze Preference: Gaze right Tracking/Visual Pursuits: Unable to hold eye position out of midline;Decreased smoothness of horizontal tracking;Requires cues, head turns, or add eye shifts to track Visual Fields: Left visual field deficit Additional Comments: no nystagmus noted this session, use of occlusion glasses   Perception Perception Perception: Impaired   Praxis Praxis Praxis:  (L neglect) Praxis Impairment Details: Initiation;Motor planning    Cognition Arousal/Alertness: Lethargic Behavior During Therapy: Flat affect Overall Cognitive Status: Impaired/Different from baseline Area of Impairment: Attention, Following commands, Safety/judgement, Awareness, Problem solving                   Current Attention Level: Sustained   Following Commands: Follows one step commands with increased time Safety/Judgement: Decreased awareness of safety, Decreased awareness of deficits (L neglect, R gaze) Awareness: Intellectual Problem Solving: Slow processing, Requires verbal cues, Requires tactile cues, Decreased initiation, Difficulty sequencing          Exercises Other Exercises Other Exercises: WB on L elbow x3    Shoulder Instructions       General Comments VSS, family member in room assisting with suction throughotu session, educated family member to keep occlusion glasses on pt unless pt takes them off    Pertinent Vitals/ Pain       Pain Assessment Pain Assessment:  Faces Pain Score: 6  Faces Pain Scale: Hurts even more Pain Location: R hip, pt kept reaching for R hip and trying to turn to L to get off of it Pain Descriptors / Indicators: Discomfort, Guarding Pain Intervention(s): Repositioned  Home Living                                          Prior Functioning/Environment              Frequency  Min 2X/week        Progress Toward Goals  OT Goals(current goals can now be found in the care plan section)  Progress towards OT goals: Progressing toward goals  Acute Rehab OT Goals Patient Stated Goal: none stated OT Goal Formulation: With family Time For Goal Achievement: 10/10/21 Potential to Achieve Goals: Good ADL Goals Pt Will Perform Grooming: with min assist;sitting Pt Will Perform Upper Body Bathing: with mod assist Pt Will Perform Upper Body Dressing: with min assist;sitting Pt Will Transfer to Toilet: with mod assist;squat pivot transfer;bedside commode Pt/caregiver will Perform Home Exercise Program: Increased strength;Left upper extremity;With minimal assist;With written HEP provided Additional ADL Goal #1: Pt will maintain midline postural control EOB with mod A in preparation for ADL tasks Additional ADL Goal #2: Pt will track to midline 1/3 trials to increase visual scanning for ADL tasks  Plan Discharge plan remains appropriate    Co-evaluation    PT/OT/SLP Co-Evaluation/Treatment: Yes Reason for  Co-Treatment: Complexity of the patient's impairments (multi-system involvement);For patient/therapist safety;To address functional/ADL transfers PT goals addressed during session: Mobility/safety with mobility OT goals addressed during session: ADL's and self-care;Strengthening/ROM      AM-PAC OT "6 Clicks" Daily Activity     Outcome Measure   Help from another person eating meals?: Total Help from another person taking care of personal grooming?: A Lot Help from another person toileting, which  includes using toliet, bedpan, or urinal?: Total Help from another person bathing (including washing, rinsing, drying)?: A Lot Help from another person to put on and taking off regular upper body clothing?: Total Help from another person to put on and taking off regular lower body clothing?: Total 6 Click Score: 8    End of Session    OT Visit Diagnosis: Unsteadiness on feet (R26.81);Other abnormalities of gait and mobility (R26.89);Muscle weakness (generalized) (M62.81);Feeding difficulties (R63.3);Cognitive communication deficit (R41.841);Hemiplegia and hemiparesis;Pain;Adult, failure to thrive (R62.7) Symptoms and signs involving cognitive functions: Cerebral infarction Hemiplegia - Right/Left: Left Hemiplegia - dominant/non-dominant: Non-Dominant Hemiplegia - caused by: Cerebral infarction   Activity Tolerance Patient limited by fatigue   Patient Left in bed;with call bell/phone within reach;with bed alarm set;with family/visitor present   Nurse Communication Mobility status        Time: 1300-1330 OT Time Calculation (min): 30 min  Charges: OT General Charges $OT Visit: 1 Visit OT Treatments $Self Care/Home Management : 8-22 mins  Alfonzo Beers, OTD, OTR/L Acute Rehab [phone]   Mayer Masker 10/06/2021, 4:45 PM

## 2021-10-06 NOTE — Progress Notes (Signed)
? ?NAME:  Jacob [name], MRN:  [name], DOB:  1976-04-09, LOS: 24 ?ADMISSION DATE:  09/12/2021, CONSULTATION DATE:  09/15/2021 ?REFERRING MD:  Leonie Man - Stroke, CHIEF COMPLAINT:  Cerebral edema.   ? ?History of Present Illness:  ?46 year old man with worsening cerebral edema. ? ?He presented 4/21 with left-sided weakness which was initially waxing and waning. CTA showed right M1 occlusion.  Further decline post TNK brought for thrombectomy at [facility] 3 revascularization of right M2. ?Transfer to floor but experienced neurological decline 4/24.  CT showed malignant cerebral edema with 12 mm right to left midline shift and Trapping of the temporal horn. ? ?He was brought back to the ICU and started on 3% hypertonic saline and prepped for the operating room.  Platelets were transfused to offset effect of clopidogrel and aspirin. ? ?Taken to OR and underwent right frontotemporal parietal hemicraniectomy with placement of bone flap in abdominal subcutaneous tissue. Returned to ICU on vent. ? ?Pertinent  Medical History  ? ?Past Medical History:  ?Diagnosis Date  ? Allergy   ? Diabetes mellitus without complication ([place])   ? Hypertension   ? ?Significant Hospital Events: ?Including procedures, antibiotic start and stop dates in addition to other pertinent events   ?4/24 decompressive craniectomy. ?4/25 decreased midline shift on CT ?4/26 still on HT saline.  Switched from prop to precedex. BP meds titrated up in effort to decrease cleviprex. Cortrak placed.  ?4/27 Cleviprex maxed out, started on labetolol gtt. Started scheduled clonidine, also started coreg. Foley replaced and started on doxazosin due to urinary retention. HT infusion cut in half w/ plan to work towards stopping. Stopped fent gtt. Changed to PRN w/ plan to start precedex if needed.  Checking [place] of UE to eval fever. Also sending PCT ?5/1 trached, c/o pain upper abd, tmax 101.3, diarrhea overnight ?5/2 febrile 102.7, WBC up, c/o ongoing abd pain/distention, KUB  c/w ileus, cultured and started on vanc/ cefepime, NGT placed for decompression. ?5/3 suspected VZV infection. Added acyclovir  ?5/4 failed SBT with apnea  ?5/5 working with PT  ?5/6 Bcx +MRSA ?5/7 tolerating PSV, still with irregular breathing patterns ? ?Interim History / Subjective:  ?Having some head pain. ?On TC, ongoing thick secretions. ? ?Objective   ?Blood pressure 134/86, pulse 86, temperature 99.4 ?F (37.4 ?C), temperature source Oral, resp. rate (!) 21, height 6\' 1"  (1.854 m), weight 105 kg, SpO2 98 %. ?   ?FiO2 (%):  [21 %] 21 %  ? ?Intake/Output Summary (Last 24 hours) at 10/06/2021 0937 ?Last data filed at 10/06/2021 0400 ?Gross per 24 hour  ?Intake 3647.15 ml  ?Output 1150 ml  ?Net 2497.15 ml  ? ? ?Filed Weights  ? 09/29/21 0500 09/30/21 0500 10/06/21 0500  ?Weight: 102.1 kg 106.5 kg 105 kg  ? ?Examination: ?No distress ?Dense L hemiparesis ?Copious thick mucopurulent secretions ?Lungs with rhonci bilaterally ?R gaze preference ?Ext warm, ? ?No new imaging ? ?Assessment & Plan:  ?Acute R MCA stroke s/p tpa and mechanical thrombectomy c/b cerebral edema (brain compression), s/p decompressive R hemicrani with abdominal flap (4/24) ?Acute hypoxic respiratory failure, s/p tracheostomy ?Sepsis due to MRSA PNA  ?MRSA bacteremia ? ?Continue TC  ?Linezolid to end 5/20 ?Secretions too much to consider downsizing at present ?Should be okay to remove sutures, RT order placed ?Agree with [facility] dispo ?On next trach change switch to cuffless ?Will follow weekly, next 10/13/21 ? ?Best Practice (right click and "Reselect all SmartList Selections" daily)  ? ?Per primrary ? ?  Erskine Emery MD PCCM ? ?

## 2021-10-06 NOTE — Progress Notes (Signed)
ANTICOAGULATION CONSULT NOTE  ? ?Pharmacy Consult for heparin  ?Indication: DVT ? ?Allergies  ?Allergen Reactions  ? Hydrochlorothiazide Other (See Comments)  ?  Bilateral muscle cramping  ? ? ?Patient Measurements: ?Height: 6\' 1"  (185.4 cm) ?Weight: 106.5 kg (234 lb 12.6 oz) ?IBW/kg (Calculated) : 79.9 ?Heparin Dosing Weight: 102 kg  ? ?Vital Signs: ?Temp: 99.6 ?F (37.6 ?C) (05/14 2320) ?Temp Source: Axillary (05/14 2320) ?BP: 128/91 (05/14 2320) ?Pulse Rate: 85 (05/14 2320) ? ?Labs: ?Recent Labs  ?  10/03/21 ?0302 10/04/21 ?[id number] 10/04/21 ?1619 10/05/21 ?[id number] 10/05/21 ?1430 10/05/21 ?2201  ?HGB 9.1* 9.0*  --  8.6*  --   --   ?HCT 28.8* 27.2*  --  26.6*  --   --   ?PLT 345 378  --  354  --   --   ?HEPARINUNFRC  --   --    < > 0.52 0.18* 0.21*  ?CREATININE 0.77 0.81  --  0.86  --   --   ? < > = values in this interval not displayed.  ? ? ? ?Estimated Creatinine Clearance: 134.5 mL/min (by C-G formula based on SCr of 0.86 mg/dL). ? ? ?Medical History: ?Past Medical History:  ?Diagnosis Date  ? Allergy   ? Diabetes mellitus without complication ([place])   ? Hypertension   ? ? ? ?Assessment: ?37 YOM presented with stroke requiring administration of TNK and thrombectomy 4/21. Patient noted to have acute CVT involving right peroneal veins on 5/12. Pharmacy consulted for heparin dosing  targeting a lower end of the therapeutic range given recent stroke.  ? ? ?Heparin level subtherapeutic: 0.21 on 1850 units/hr; no infusion issues or s/sx if bleeding per RN, will increase ~2 units/kg/hr ? ?Goal of Therapy:  ?Heparin level 0.3-0.5  ?Monitor platelets by anticoagulation protocol: Yes ?  ?Plan:  ?- Increase heparin to 2100 units/hr ?- Repeat 6h heparin level  ?- Daily heparin level and CBC  ? ?Georga Bora, PharmD ?Clinical Pharmacist ?10/06/2021 12:06 AM ?Please check AMION for all [place] numbers ? ? ? ? ? ? ? ? ?

## 2021-10-06 NOTE — Progress Notes (Signed)
Inpatient Diabetes Program Recommendations ? ?AACE/ADA: New Consensus Statement on Inpatient Glycemic Control (2015) ? ?Target Ranges:  Prepandial:   less than 140 mg/dL ?     Peak postprandial:   less than 180 mg/dL (1-2 hours) ?     Critically ill patients:  140 - 180 mg/dL  ? ?Lab Results  ?Component Value Date  ? GLUCAP 256 (H) 10/06/2021  ? HGBA1C 8.1 (H) 09/12/2021  ? ? ?Review of Glycemic Control ? Latest Reference Range & Units 10/05/21 23:35 10/06/21 03:36 10/06/21 08:37 10/06/21 12:30  ?Glucose-Capillary 70 - 99 mg/dL 290 (H) 211 (H) 155 (H) 256 (H)  ?(H): Data is abnormally high ?Diabetes history: Type 2 DM ?Outpatient Diabetes medications: Metformin 1000 mg BID (NT), Glipizide 10 mg BID (NT) ?Current orders for Inpatient glycemic control: Novolog 0-20 units Q4H, Levemir 40 units BID, Novolog 5 units Q4H ?Vital @ 60 ml/hr ? ?Inpatient Diabetes Program Recommendations:   ? ?Consider increasing Levemir to 50 units BID.  ? ?Thanks, ?Lujean Rave, MSN, RNC-OB ?Diabetes Coordinator ?[phone] (8a-5p) ? ? ? ? ?

## 2021-10-06 NOTE — TOC Progression Note (Signed)
Transition of Care (TOC) - Progression Note  ? ? ?Patient Details  ?Name: Jacob [name] ?MRN: [id number] ?Date of Birth: 05-Sep-1975 ? ?Transition of Care (TOC) CM/SW Contact  ?Curlene Labrum, RN ?Phone Number: ?10/06/2021, 1:30 PM ? ?Clinical Narrative:    ?CM spoke with the patient's wife on the phone states that she would like to have insurance authorization started to switch her choice for LTAC to [facility].  I called and notified Arley Phenix, CM with [facility] and she was informed and will retract the authorization.  Raquel Sarna, CM at [facility] will submit for authorization and send clinicals as requested by the family. ? ?Ship broker pending for [employer]. ? ? ?Expected Discharge Plan: [place] (LTAC) ?Barriers to Discharge: Continued Medical Work up ? ?Expected Discharge Plan and Services ?Expected Discharge Plan: [place] (LTAC) ?In-house Referral: Clinical Social Work, Development worker, community ?Discharge Planning Services: CM Consult ?Post Acute Care Choice: Long Term Acute Care (LTAC) ?Living arrangements for the past 2 months: [place] ?                ?  ?  ?  ?  ?  ?  ?  ?  ?  ?  ? ? ?Social Determinants of Health (SDOH) Interventions ?  ? ?Readmission Risk Interventions ?   ? View : No data to display.  ?  ?  ?  ? ? ?

## 2021-10-06 NOTE — Progress Notes (Signed)
Patient's insurance authorization for [employer] is pending at this time. ? ?Madilyn Fireman, MSW, LCSW ?Transitions of Care  Clinical Social Worker II ?[phone] ? ?

## 2021-10-06 NOTE — Progress Notes (Addendum)
:.  Show STROKE TEAM PROGRESS NOTE  ? ?INTERVAL HISTORY ?Patient is seen in his room with his RN at the bedside.  He has had no acute events overnight, his neurological exam is stable and his vital signs are stable.  Will switch heparin to Eliquis today for DVT treatment.  Plan to discharge to [facility] soon. ? ?Vitals:  ? 10/06/21 0321 10/06/21 0500 10/06/21 0818 10/06/21 [id number]  ?BP: 134/86     ?Pulse:   86   ?Resp: 20  (!) 21   ?Temp: 99.4 ?F (37.4 ?C)   99.4 ?F (37.4 ?C)  ?TempSrc: Axillary   Oral  ?SpO2:   98%   ?Weight:  105 kg    ?Height:      ? ?CBC:  ?Recent Labs  ?Lab 10/05/21 ?[id number] 10/06/21 ?0825  ?WBC 11.4* 12.1*  ?HGB 8.6* 9.3*  ?HCT 26.6* 28.4*  ?MCV 88.1 88.2  ?PLT 354 342  ? ? ?Basic Metabolic Panel:  ?Recent Labs  ?Lab 10/05/21 ?[id number] 10/06/21 ?0825  ?NA 138 135  ?K 3.6 3.8  ?CL 106 104  ?CO2 25 23  ?GLUCOSE 267* 266*  ?BUN 16 15  ?CREATININE 0.86 0.79  ?CALCIUM 8.2* 8.5*  ? ? ?Lipid Panel:  ?No results for input(s): CHOL, TRIG, HDL, CHOLHDL, VLDL, LDLCALC in the last 168 hours. ? ?HgbA1c:  ?No results for input(s): HGBA1C in the last 168 hours. ? ?Urine Drug Screen:  ?No results for input(s): LABOPIA, COCAINSCRNUR, LABBENZ, AMPHETMU, THCU, LABBARB in the last 168 hours. ?  ?Alcohol Level No results for input(s): ETH in the last 168 hours. ? ?IMAGING past 24 hours ?No results found. ? ?PHYSICAL EXAM ? ?Physical Exam  ?Constitutional: Appears well-developed and well-nourished middle-aged African-American male, hemi craniectomy surgical incision  ?Cardiovascular: Normal rate and regular rhythm.  ?Respiratory: Respirations regular and unlabored on trach collar ? ?Neuro (s/p trach): Sleepy but can be aroused and interactive.  Right gaze deviation, blink to threat. Following commands on the right and midline.  PERRL, cough and gag reflexes present.  Dense left hemiplegia.  Antigravity strength on the right ? ?ASSESSMENT/PLAN ?Mr. MARSALIS GIULIANI is a 46 y.o. male with history of HTN, DM2 presenting with left  side facial droop, left arm weakness. Received TNKase. Neuro status declined during MRI (NIHSS 13),taken for thrombectomy with Dr. Karenann Cai. TICI3 recanalization of the right M2/MCA achieved.  Patient experienced cerebral edema post procedure, was returned to the ICU and underwent right hemicraniectomy.  Currently has tracheostomy and is receiving Eliquis for treatment of right peroneal vein DVT.  Plan to discharge to [facility] soon. ? ?Stroke:  Right MCA due to right MCA occlusion  s/p TNK and IR with TICI3, etiology not certain, but most likely cryptogenic versus intracranial atherosclerosis. ?Code Stroke CT head No acute abnormality ?CTA head & neck R short segment M1 occlusion, left distal M1 high grade stenosis ?Post IR CT No hemorrhagic complication on flat panel CT. ?4/21- Early MRI Tiny acute infarcts in the upper division right MCA cortex. ?4/22- MRI right MCA territory infarct involving the cortex, sparing the basal ganglia.  2 mm midline shift.  No hemorrhage ?4/22- MRA reoccluded right M2 branch, intracranial atherosclerosis including advanced left M1 stenosis ?4/24-  Head CT-increased edema resulting in near complete effacement of the right lateral ventricle and 12 mm leftward midline shift ?4/25- Head CT Post OP- 5 mm left midline shift, new area of hypoattenuation of the left middle cerebellar peduncle and cerebellum ?4/28 head CT large right  MCA infarct with no HT, diminished mass effect with no midline shift, hemicraniectomy noted ?5/3 CT head stable, no midline shift, large right MCA infarct ?2D Echo EF > 75% ?TEE- Consistent with intrapulmonary shunt ?LDL 96 ?HgbA1c 8.1 ?UDS negative ?VTE prophylaxis - SCDs ?No antithrombotic prior to admission, now on Eliquis 5mg  bid due to DVT.  ?Therapy recommendations: LTAC vs. CIR ?disposition:  Pending ? ?Cerebral Edema s/p [facility] ?Head CT 4/24 shows increased edema with new 54mm midline shift ?Decompressive hemicraniectomy performed 4/24 by  neurosurgery ?Remove staples on postop day 10 ?Neurosurgery plans to replace bone flap when swelling subsides, bone flap in abdomen ?No midline shift seen on head CT 4/28 and 5/3 ?Na stable now ?Off HTS  ?Discontinue Keppra per NSG ? ?Intracranial stenosis ?CTA head & neck R short segment M1 occlusion, left distal M1 high grade stenosis ?No significant extracranial stenosis ?Most likely related to long standing uncontrolled risk factors. ? ?Right Peroneal DVT ?High risk for thrombosis proximal extension ?Low risk of bleeding at this time ?Switch to Eliquis 5mg  bid today ? ?Hypertension ?Home meds:  Amlodipine 10mg , lisinopril 10mg -resumed ?Stable ?BP goal <160  ?On coreg 25, amlodipine 10, clonidine 0.2 Q8, lisinopril 40  ?PRN labetalol and hydralazine ?Long-term BP goal normotensive ? ?Hyperlipidemia ?Home meds:  Atorvastatin 20mg  ?LDL 96, goal < 70 ?Put on lipitor 40 ?Continue statin at discharge ? ?Diabetes type II Uncontrolled ?Home meds:  Glipizide, metformin ?HgbA1c 8.1, goal < 7.0 ?CBGs ?Still has hyperglycemia ?SSI with resist scale, Levemir increase to 40->50 units twice daily ?Novolog increase to 5->6u q 4hr plus slide ?DM coordinator consulted ? ?Other Stroke Risk Factors ?Obesity, Body mass index is 30.54 kg/m?., BMI >/= 30 associated with increased stroke risk, recommend weight loss, diet and exercise as appropriate  ? ?Dysphagia  ?Ileus, resolved ?Cortrak in place ?TF at 98ml/hr, prosource BID ?Consult adhesion for bolus feeding ? ?Respiratory failure ?Patient left intubated after surgery ?Ventilator management per CCM ?Tracheostomy placed 5/1 ?On trach collar  ? ?Fever and leukocytosis ?Body rash ?Initial blood culture showed staph, now no growth as of 5/6 ?Afebrile now ?WBC 12.3-14.5-18.5-17.2 -> 13.9->10.7-> 13.1 -> 10.8-> 11.5-> 10.9 -> 11.4-> 12.1 ?Body rash - not chicken pox - isolation lifted ?VZV IgM-negative ?VZV PCR negative ?Tracheal aspirate + MRSA -> On vancomycin ->Switch to PO linezolid  - continue for 2 weeks from negative culture on 5/6 (end date 10/11/2021) ? ?Post stroke depression ?started lexapro. ? ?Headaches ?Topamax 50mg  BID ?PRN oxycodone q4hrs ? ?Hospital day # 24 ? ? ?Patient seen and examined by NP/APP with MD. MD to update note as needed.  ? ?Boody , MSN, AGACNP-BC ?[facility] ?See Amion for schedule and pager information ?10/06/2021 10:48 AM ?  ?ATTENDING NOTE: ?I reviewed above note and agree with the assessment and plan. Pt was seen and examined.  ? ?No family at bedside.  Patient still lethargic, follows some commands on the right, but worked poorly with PT/OT, still recommend LTAC at this time.  Discussed with NSG, will DC Keppra.  Patient has tolerated heparin IV for the last 2 days, will change to Eliquis for DVT treatment.  Still has hyperglycemia, increase basal and pre-meal insulin dosage.  Will consult dietitian for bolus feeding.  And pending placement. ? ?For detailed assessment and plan, please refer to above as I have made changes wherever appropriate.  ? ?Rosalin Hawking, MD PhD ?Stroke Neurology ?10/06/2021 ?4:12 PM ? ? ? ?To contact Stroke Continuity provider, please  refer to [email or web address] ?After hours, contact General Neurology ? ?

## 2021-10-06 NOTE — Progress Notes (Signed)
Subjective: ?The patient is alert and pleasant.  He gives thumbs up and nods appropriately.  He is in no apparent distress. ? ?Objective: ?Vital signs in last 24 hours: ?Temp:  [99 ?F (37.2 ?C)-99.6 ?F (37.6 ?C)] 99.4 ?F (37.4 ?C) (05/15 0321) ?Pulse Rate:  [81-89] 85 (05/14 2320) ?Resp:  [20-30] 20 (05/15 0321) ?BP: (128-143)/(85-94) 134/86 (05/15 0321) ?SpO2:  [93 %-99 %] 99 % (05/14 2320) ?FiO2 (%):  [21 %] 21 % (05/15 0321) ?Weight:  [105 kg] 105 kg (05/15 0500) ?Estimated body mass index is 30.54 kg/m? as calculated from the following: ?  Height as of this encounter: 6\' 1"  (1.854 m). ?  Weight as of this encounter: 105 kg. ? ? ?Intake/Output from previous day: ?05/14 0701 - 05/15 0700 ?In: 3647.2 [I.V.:673.2; NG/GT:2974] ?Out: 1150 [Urine:1150] ?Intake/Output this shift: ?No intake/output data recorded. ? ?Physical exam the patient is alert and attentive.  He follows commands on the right.  His scalp flap is full.  His wounds are healing well. ? ?Lab Results: ?Recent Labs  ?  10/04/21 ?[id number] 10/05/21 ?0248  ?WBC 10.9* 11.4*  ?HGB 9.0* 8.6*  ?HCT 27.2* 26.6*  ?PLT 378 354  ? ?BMET ?Recent Labs  ?  10/04/21 ?[id number] 10/05/21 ?0248  ?NA 138 138  ?K 3.4* 3.6  ?CL 106 106  ?CO2 25 25  ?GLUCOSE 235* 267*  ?BUN 15 16  ?CREATININE 0.81 0.86  ?CALCIUM 8.2* 8.2*  ? ? ?Studies/Results: ?No results found. ? ?Assessment/Plan: ?Status post decompressive craniectomy: The patient is improving neurologically.  He is not ready to have his craniectomy flap replaced yet.  It looks like he will need rehab. ? LOS: 24 days  ? ? ? ?[name] ?10/06/2021, 8:03 AM ? ? ? ? ?Patient ID: Jacob [name], male   DOB: [birth year], 46 y.o.   MRN: [id number] ? ?

## 2021-10-06 NOTE — Procedures (Signed)
Tracheostomy Change Note ? ?Patient Details:   ?Name: Jacob [name] ?DOB: 04-26-1976 ?MRN: [id number] ?   ?Airway Documentation: ?   ? ?Evaluation ? O2 sats: stable throughout ?Complications: No apparent complications ?Patient did tolerate procedure well. ?Bilateral Breath Sounds: Clear, Diminished ?  ?RT X 2 changed patient's trach to #8 Shiley cuffless with no apparent complications. Positive color change noted on ETCO2 with BBS. Vitals are stable. RT will continue to monitor.  ? ?Lokelani Lutes Clyda Greener ?10/06/2021, 12:10 PM ?

## 2021-10-06 NOTE — Progress Notes (Signed)
Physical Therapy Treatment ?Patient Details ?Name: Jacob [name] ?MRN: [id number] ?DOB: 13-Jul-1975 ?Today's Date: 10/06/2021 ? ? ?History of Present Illness 46 y.o. male presents to [facility] hospital on 09/12/2021 with L facial droop and arm weakness. CTA revealed R M1 occlusion. Pt received tNK. Pt underwent thrombectomy on 4/21. Underwent R frontotemporal parietal hemicraniectomy on 4/24 after declining 4/23. CT + Right MCA/PCA infarct. Trach placement 09/22/2021. Found DVT at right peroneal veins. on 5/12. PMH includes HTN and DM. ? ?  ?PT Comments  ? ? Pt unable to verbalize. Pt with active participation however very little initiation. Pt remains to require max/totalAX2 to transfer to EOB due to severe L dense hemiplegia, impaired body awareness, equilibrium and proprioception. Pt continues to cough a lot and difficulty managing secretions. Acute PT to cont to follow. ?   ?Recommendations for follow up therapy are one component of a multi-disciplinary discharge planning process, led by the attending physician.  Recommendations may be updated based on patient status, additional functional criteria and insurance authorization. ? ?Follow Up Recommendations ? PT at Long-term acute care hospital (although may benefit from AIR if particpation increases) ?  ?  ?Assistance Recommended at Discharge Frequent or constant Supervision/Assistance  ?Patient can return home with the following Two people to help with walking and/or transfers;Two people to help with bathing/dressing/bathroom;Assistance with cooking/housework;Assistance with feeding;Direct supervision/assist for medications management;Direct supervision/assist for financial management;Assist for transportation;Help with stairs or ramp for entrance ?  ?Equipment Recommendations ? Wheelchair (measurements PT);Wheelchair cushion (measurements PT);BSC/3in1;Hospital bed;Other (comment)  ?  ?Recommendations for Other Services Rehab consult ? ? ?  ?Precautions / Restrictions  Precautions ?Precautions: Fall ?Precaution Comments: trach, R crani with bone flap in abdomen ?Restrictions ?Weight Bearing Restrictions: No  ?  ? ?Mobility ? Bed Mobility ?Overal bed mobility: Needs Assistance ?Bed Mobility: Supine to Sit, Sit to Supine ?  ?  ?Supine to sit: Max assist, +2 for physical assistance ?Sit to supine: Total assist, +2 for physical assistance, +2 for safety/equipment ?  ?General bed mobility comments: pt was able to move R LE off EOB but ultimately required maxAx2 for trunk elevation and to bring L LE off EOB to sit square on EOB, totalAx2 to return to supine as pt unaware of R bone flap in abdomen and wanting to fall directly into R sidelying ?  ? ?Transfers ?  ?  ?  ?  ?  ?  ?  ?  ?  ?General transfer comment: did not attempted due to lethargy and dense L hemipelgia ?  ? ?Ambulation/Gait ?  ?  ?  ?  ?  ?  ?  ?  ? ? ?Stairs ?  ?  ?  ?  ?  ? ? ?Wheelchair Mobility ?  ? ?Modified Rankin (Stroke Patients Only) ?Modified Rankin (Stroke Patients Only) ?Pre-Morbid Rankin Score: No symptoms ?Modified Rankin: Severe disability ? ? ?  ?Balance Overall balance assessment: Needs assistance ?Sitting-balance support: Feet supported, Single extremity supported ?Sitting balance-Leahy Scale: Poor ?Sitting balance - Comments: pt pushing with R UE towards L, unsure if it's due to impaired propioception/equilibrium vs trying to off load R hip due to pain, attempted to work on correction to midline however pt required max verbal and tactile cues, pt with strong L lateral lean, pt sat EOB x 8 min ?Postural control: Posterior lean, Left lateral lean ?  ?  ?  ?  ?  ?  ?  ?  ?  ?  ?  ?  ?  ?  ?  ? ?  ?  Cognition Arousal/Alertness: Lethargic ?Behavior During Therapy: Flat affect ?Overall Cognitive Status: Impaired/Different from baseline ?Area of Impairment: Attention, Following commands, Safety/judgement, Awareness, Problem solving ?  ?  ?  ?  ?  ?  ?  ?  ?  ?Current Attention Level: Sustained ?  ?Following  Commands: Follows one step commands with increased time ?Safety/Judgement: Decreased awareness of safety, Decreased awareness of deficits (L neglect, R gaze) ?Awareness: Intellectual ?Problem Solving: Slow processing, Requires verbal cues, Requires tactile cues, Decreased initiation, Difficulty sequencing ?General Comments: pt with flat affect, slow to responsd, and decreased inititation, pt with poor carry over and inability to self correct, pt with noted decreased insight to deficits, L inattention and R gaze ?  ?  ? ?  ?Exercises General Exercises - Lower Extremity ?Hip Flexion/Marching: AAROM, Left, 10 reps, Supine (PT to range L LE, pt with noted increased tone vs stiffnes, pt initially grimacing but then relaxed) ?Other Exercises ?Other Exercises: worked on Museum/gallery curator onto L elbow in sitting with max verbal and tactile cues to push up with L UE, pt ultimately requiring totalA due to dense hemiplegia ? ?  ?General Comments General comments (skin integrity, edema, etc.): worked on EOB balance, VSS ?  ?  ? ?Pertinent Vitals/Pain Pain Assessment ?Pain Assessment: Faces ?Faces Pain Scale: Hurts even more ?Pain Location: R hip, pt kept reaching for R hip and trying to turn to L to get off of it ?Pain Descriptors / Indicators: Discomfort, Guarding ?Pain Intervention(s): Repositioned (place pillow under R hip when returned pt to supine, pt gave thumbs up for comfort)  ? ? ?Home Living   ?  ?  ?  ?  ?  ?  ?  ?  ?  ?   ?  ?Prior Function    ?  ?  ?   ? ?PT Goals (current goals can now be found in the care plan section) Acute Rehab PT Goals ?PT Goal Formulation: Patient unable to participate in goal setting ?Time For Goal Achievement: 10/21/21 ?Potential to Achieve Goals: Fair ?Progress towards PT goals: Progressing toward goals ? ?  ?Frequency ? ? ? Min 2X/week ? ? ? ?  ?PT Plan Current plan remains appropriate  ? ? ?Co-evaluation PT/OT/SLP Co-Evaluation/Treatment: Yes ?Reason for Co-Treatment: Complexity of the  patient's impairments (multi-system involvement);For patient/therapist safety ?PT goals addressed during session: Mobility/safety with mobility ?  ?  ? ?  ?AM-PAC PT "6 Clicks" Mobility   ?Outcome Measure ? Help needed turning from your back to your side while in a flat bed without using bedrails?: A Lot ?Help needed moving from lying on your back to sitting on the side of a flat bed without using bedrails?: A Lot ?Help needed moving to and from a bed to a chair (including a wheelchair)?: Total ?Help needed standing up from a chair using your arms (e.g., wheelchair or bedside chair)?: Total ?Help needed to walk in hospital room?: Total ?Help needed climbing 3-5 steps with a railing? : Total ?6 Click Score: 8 ? ?  ?End of Session Equipment Utilized During Treatment: Oxygen ?Activity Tolerance: Patient tolerated treatment well;Patient limited by fatigue ?Patient left: in bed;with call bell/phone within reach;with family/visitor present;with bed alarm set ?Nurse Communication: Mobility status ?PT Visit Diagnosis: Other abnormalities of gait and mobility (R26.89);Muscle weakness (generalized) (M62.81);Other symptoms and signs involving the nervous system (R29.898);Hemiplegia and hemiparesis ?Hemiplegia - Right/Left: Left ?Hemiplegia - dominant/non-dominant: Non-dominant ?Hemiplegia - caused by: Cerebral infarction ?  ? ? ?Time: [id number] ?PT Time  Calculation (min) (ACUTE ONLY): 30 min ? ?Charges:  $Neuromuscular Re-education: 8-22 mins          ?          ? ?Marland KitchenKittie Plater, PT, DPT ?Acute Rehabilitation Services ?Secure chat preferred ?Office #: [phone] ? ? ? ?Bethanny Toelle M Javarri Segal ?10/06/2021, 2:06 PM ? ?

## 2021-10-06 NOTE — Progress Notes (Signed)
ANTICOAGULATION CONSULT NOTE - Initial Consult ? ?Pharmacy Consult to transtition heparin to apixaban ?Indication: RLE DVT ? ?Allergies  ?Allergen Reactions  ? Hydrochlorothiazide Other (See Comments)  ?  Bilateral muscle cramping  ? ? ?Patient Measurements: ?Height: _0  (185.4 cm) ?Weight: 105 kg (231 lb 7.7 oz) (4 pillows) ?IBW/kg (Calculated) : 79.9 ? ?Vital Signs: ?Temp: 99.4 ?F (37.4 ?C) (05/15 1324) ?Temp Source: Oral (05/15 4010) ?BP: 134/86 (05/15 0321) ?Pulse Rate: 86 (05/15 0818) ? ?Labs: ?Recent Labs  ?  10/04/21 ?2725 10/04/21 ?1619 10/05/21 ?3664 10/05/21 ?1430 10/05/21 ?2201 10/06/21 ?0825  ?HGB 9.0*  --  8.6*  --   --  9.3*  ?HCT 27.2*  --  26.6*  --   --  28.4*  ?PLT 378  --  354  --   --  342  ?HEPARINUNFRC  --    < > 0.52 0.18* 0.21* 0.55  ?CREATININE 0.81  --  0.86  --   --   --   ? < > = values in this interval not displayed.  ? ? ?Estimated Creatinine Clearance: 133.6 mL/min (by C-G formula based on SCr of 0.86 mg/dL). ? ? ?Medical History: ?Past Medical History:  ?Diagnosis Date  ? Allergy   ? Diabetes mellitus without complication ([place])   ? Hypertension   ? ? ?Medications:  ?Facility-Administered Medications Prior to Admission  ?Medication Dose Route Frequency Provider Last Rate Last Admin  ? cloNIDine (CATAPRES) tablet 0.2 mg  0.2 mg Oral Once Mayers, Cari S, PA-C      ? ?Medications Prior to Admission  ?Medication Sig Dispense Refill Last Dose  ? clotrimazole (LOTRIMIN) 1 % cream Apply 1 application. topically daily as needed (foot itching).   unknown  ? ibuprofen (ADVIL) 200 MG tablet Take 400 mg by mouth every 6 (six) hours as needed for headache (pain).   09/09/2021  ? amLODipine (NORVASC) 10 MG tablet Take 1 tablet (10 mg total) by mouth daily. (Patient taking differently: Take 10 mg by mouth every evening.) 90 tablet 1   ? atorvastatin (LIPITOR) 20 MG tablet Take 1 tablet (20 mg total) by mouth daily. (Patient not taking: Reported on 09/12/2021) 90 tablet 1 Not Taking  ? Blood Glucose  Monitoring Suppl (TRUE METRIX METER) w/Device KIT USE AS DIRECTED 1 kit 0   ? glipiZIDE (GLUCOTROL) 10 MG tablet Take 1 tablet (10 mg total) by mouth 2 (two) times daily before a meal. (Patient not taking: Reported on 09/12/2021) 180 tablet 1 Not Taking  ? glucose blood (TRUE METRIX BLOOD GLUCOSE TEST) test strip Use three times daily 100 each 12   ? lisinopril (ZESTRIL) 10 MG tablet Take 1 tablet (10 mg total) by mouth daily. (Patient not taking: Reported on 09/12/2021) 30 tablet 0 Not Taking  ? metFORMIN (GLUCOPHAGE) 500 MG tablet Take 2 tablets (1,000 mg total) by mouth 2 (two) times daily with a meal. (Patient not taking: Reported on 09/12/2021) 120 tablet 6 Not Taking  ? TRUEplus Lancets 28G MISC use 3 (three) times daily before meals. 100 each 12   ? Vitamin D, Ergocalciferol, (DRISDOL) 1.25 MG (50000 UNIT) CAPS capsule Take 1 capsule (50,000 Units total) by mouth every 7 (seven) days. (Patient not taking: Reported on 09/12/2021) 4 capsule 2 Not Taking  ? ? ?Assessment: ?Jacob [name] is a 46 yo male who presented on 09/12/21 with stroke requiring administration of TNK and thrombectomy. On 5/12 patient noted to have RLE DVT, started on heparin, now transition to apixaban. Per  consult (Neuro), NO loading of eliquis due to large stroke and s/p hemicraniectomy. Received TNKase and taken for thrombectomy. ?Start with apixaban 61m bid directly. ? ?Goal of Therapy:  ?Therapeutic anticoagulation ?Monitor platelets by anticoagulation protocol: Yes ?  ? ?Plan:  ?Discontinue heparin ?Start apixaban 5 mg PO BID ?Monitor for bleeding ? ? ? ?Thank you for allowing [facility] participate in this patients care. ?TJens Som PharmD ?10/06/2021 9:20 AM ? ?**Pharmacist phone directory can be found on [facility] listed under [place]* ? ? ?

## 2021-10-07 DIAGNOSIS — I63519 Cerebral infarction due to unspecified occlusion or stenosis of unspecified middle cerebral artery: Secondary | ICD-10-CM

## 2021-10-07 DIAGNOSIS — G936 Cerebral edema: Secondary | ICD-10-CM | POA: Diagnosis not present

## 2021-10-07 DIAGNOSIS — I63411 Cerebral infarction due to embolism of right middle cerebral artery: Secondary | ICD-10-CM | POA: Diagnosis not present

## 2021-10-07 DIAGNOSIS — I639 Cerebral infarction, unspecified: Secondary | ICD-10-CM | POA: Diagnosis not present

## 2021-10-07 DIAGNOSIS — I63511 Cerebral infarction due to unspecified occlusion or stenosis of right middle cerebral artery: Secondary | ICD-10-CM | POA: Diagnosis not present

## 2021-10-07 LAB — CBC
HCT: 31 % — ABNORMAL LOW (ref 39.0–52.0)
Hemoglobin: 10.1 g/dL — ABNORMAL LOW (ref 13.0–17.0)
MCH: 28.5 pg (ref 26.0–34.0)
MCHC: 32.6 g/dL (ref 30.0–36.0)
MCV: 87.3 fL (ref 80.0–100.0)
Platelets: UNDETERMINED 10*3/uL (ref 150–400)
RBC: 3.55 MIL/uL — ABNORMAL LOW (ref 4.22–5.81)
RDW: 13.2 % (ref 11.5–15.5)
WBC: 11.7 10*3/uL — ABNORMAL HIGH (ref 4.0–10.5)
nRBC: 0 % (ref 0.0–0.2)

## 2021-10-07 LAB — GLUCOSE, CAPILLARY
Glucose-Capillary: 118 mg/dL — ABNORMAL HIGH (ref 70–99)
Glucose-Capillary: 119 mg/dL — ABNORMAL HIGH (ref 70–99)
Glucose-Capillary: 239 mg/dL — ABNORMAL HIGH (ref 70–99)
Glucose-Capillary: 267 mg/dL — ABNORMAL HIGH (ref 70–99)
Glucose-Capillary: 294 mg/dL — ABNORMAL HIGH (ref 70–99)
Glucose-Capillary: 92 mg/dL (ref 70–99)

## 2021-10-07 LAB — BASIC METABOLIC PANEL
Anion gap: 8 (ref 5–15)
BUN: 15 mg/dL (ref 6–20)
CO2: 22 mmol/L (ref 22–32)
Calcium: 8.6 mg/dL — ABNORMAL LOW (ref 8.9–10.3)
Chloride: 107 mmol/L (ref 98–111)
Creatinine, Ser: 0.76 mg/dL (ref 0.61–1.24)
GFR, Estimated: >60 mL/min (ref >60)
Glucose, Bld: 195 mg/dL — ABNORMAL HIGH (ref 70–99)
Potassium: 4.4 mmol/L (ref 3.5–5.1)
Sodium: 137 mmol/L (ref 135–145)

## 2021-10-07 MED ORDER — FREE WATER
120.0000 mL | Freq: Four times a day (QID) | Status: DC
  Administered 2021-10-07 – 2021-10-08 (×4): 120 mL

## 2021-10-07 MED ORDER — GLUCERNA 1.5 CAL PO LIQD
237.0000 mL | Freq: Four times a day (QID) | ORAL | Status: AC
  Administered 2021-10-07 (×2): 237 mL
  Filled 2021-10-07 (×3): qty 237

## 2021-10-07 MED ORDER — INSULIN ASPART 100 UNIT/ML IJ SOLN: 0.0000 [IU] | Freq: Three times a day (TID) | INTRAMUSCULAR | Status: DC

## 2021-10-07 MED ORDER — GLUCERNA 1.5 CAL PO LIQD
360.0000 mL | Freq: Four times a day (QID) | ORAL | Status: DC
  Administered 2021-10-08 (×2): 360 mL
  Filled 2021-10-07 (×5): qty 474

## 2021-10-07 MED ORDER — INSULIN ASPART 100 UNIT/ML IJ SOLN
0.0000 [IU] | INTRAMUSCULAR | Status: DC
  Administered 2021-10-07: 0 [IU] via SUBCUTANEOUS
  Administered 2021-10-08: 2 [IU] via SUBCUTANEOUS
  Administered 2021-10-08: 3 [IU] via SUBCUTANEOUS

## 2021-10-07 MED ORDER — GLUCERNA 1.5 CAL PO LIQD
240.0000 mL | Freq: Four times a day (QID) | ORAL | Status: DC
  Filled 2021-10-07: qty 474

## 2021-10-07 MED ORDER — INSULIN ASPART 100 UNIT/ML IJ SOLN
8.0000 [IU] | Freq: Four times a day (QID) | INTRAMUSCULAR | Status: DC
  Administered 2021-10-07 – 2021-10-08 (×3): 8 [IU] via SUBCUTANEOUS

## 2021-10-07 NOTE — Progress Notes (Signed)
Speech Language Pathology Treatment: Nada Boozer Speaking valve;Cognitive-Linquistic  ?Patient Details ?Name: Jacob [name] ?MRN: [id number] ?DOB: 08-05-1975 ?Today's Date: 10/07/2021 ?Time: [id number] ?SLP Time Calculation (min) (ACUTE ONLY): 15 min ? ?Assessment / Plan / Recommendation ?Clinical Impression ? Patient seen by SLP for skilled treatment session focused on PMV and cognitive-linguistic goals. Patient awake and alert in bed with no family present when SLP arrived. He was able to give thumbs up and nod/shake head, however at times, SLP was unsure of meaning of gestures. He was frequently demonstrating what appeared to be attempts to relieve pressure on right side and at one point, he reached for SLP's hand and then proceeded to guide it to right side of his abdomen. (Perhaps indicating discomfort where skull flap is currently located) He tolerated PMV for short increments (less than 60 seconds at a time) and would exhibit spontaneous vocalizations when clearing throat. He verbalized one time, saying "My back" while he gestured to his right side, but then did not exhibit any cued or spontaneous verbalizations. No change in RR or SpO2 while PMV in place. SLP will continue to follow patient for PMV toleration and cognitive-linguistic ?  ?HPI HPI: Pt is a 46 y.o. male with who presented with left sided facial droop. TNK given. MRI brain 4/21: Tiny acute infarcts in the upper division right MCA cortex. Worsening neuro status noted after MRI with left-sided neglect, right gaze preference, sensory and vision loss, and progressive left sided weakness. Pt s/p  thrombectomy and revascularization 4/21. Dys 2 diet and thin liquids was started by SLP 4/21. Pt underwent R frontotemporal parietal hemicraniectomy on 4/24 after declining 4/23. Intubated 4/24; trach 5/1. Trach downsized to a #8 cuffless. PMH: HTN and DM ?  ?   ?SLP Plan ? Continue with current plan of care ? ?  ?  ?Recommendations for follow up therapy are one  component of a multi-disciplinary discharge planning process, led by the attending physician.  Recommendations may be updated based on patient status, additional functional criteria and insurance authorization. ?  ? ?Recommendations  ?Diet recommendations: NPO ?Medication Administration: Via alternative means  ?   ? Patient may use Passy-Muir Speech Valve: with SLP only ?MD: Please consider changing trach tube to : Smaller size  ?   ? ? ? ? Oral Care Recommendations: Oral care QID ?Follow Up Recommendations: SLP at Long-term acute care hospital ?Assistance recommended at discharge: Frequent or constant Supervision/Assistance ?SLP Visit Diagnosis: Cognitive communication deficit (R41.841);[place] (R49.1) ?Plan: Continue with current plan of care ? ? ? ? ?  ?  ? ?Sonia Baller, MA, CCC-SLP ?Speech Therapy ? ?

## 2021-10-07 NOTE — Evaluation (Signed)
Clinical/Bedside Swallow Evaluation ?Patient Details  ?Name: Jacob [name] ?MRN: [id number] ?Date of Birth: 10-06-1975 ? ?Today's Date: 10/07/2021 ?Time: SLP Start Time (ACUTE ONLY): 1455 SLP Stop Time (ACUTE ONLY): 1510 ?SLP Time Calculation (min) (ACUTE ONLY): 15 min ? ?Past Medical History:  ?Past Medical History:  ?Diagnosis Date  ? Allergy   ? Diabetes mellitus without complication (HCC)   ? Hypertension   ? ?Past Surgical History:  ?Past Surgical History:  ?Procedure Laterality Date  ? APPENDECTOMY    ? BUBBLE STUDY  10/02/2021  ? Procedure: BUBBLE STUDY;  Surgeon: Pricilla Riffle, MD;  Location: [facility] ENDOSCOPY;  Service: Cardiovascular;;  ? CRANIOTOMY Right 09/15/2021  ? Procedure: RIGHT DECOMPRESSIVE CRANIOTOMY, PLACEMENT OF BONE FLAP IN ABDOMEN;  Surgeon: Tressie Stalker, MD;  Location: [facility] OR;  Service: Neurosurgery;  Laterality: Right;  ? IR CT HEAD LTD  09/12/2021  ? IR PERCUTANEOUS ART THROMBECTOMY/INFUSION INTRACRANIAL INC DIAG ANGIO  09/12/2021  ? IR US GUIDE VASC ACCESS RIGHT  09/12/2021  ? LAPAROSCOPIC APPENDECTOMY  10/24/2013  ? Procedure: APPENDECTOMY LAPAROSCOPIC;  Surgeon: Kandis Cocking, MD;  Location: [facility] ORS;  Service: General;;  ? RADIOLOGY WITH ANESTHESIA N/A 09/12/2021  ? Procedure: IR WITH ANESTHESIA;  Surgeon: Julieanne Cotton, MD;  Location: [facility] OR;  Service: Radiology;  Laterality: N/A;  ? TEE WITHOUT CARDIOVERSION N/A 10/02/2021  ? Procedure: TRANSESOPHAGEAL ECHOCARDIOGRAM (TEE);  Surgeon: Pricilla Riffle, MD;  Location: [facility] ENDOSCOPY;  Service: Cardiovascular;  Laterality: N/A;  ? ?HPI:  ?Pt is a 46 y.o. male with who presented with left sided facial droop. TNK given. MRI brain 4/21: Tiny acute infarcts in the upper division right MCA cortex. Worsening neuro status noted after MRI with left-sided neglect, right gaze preference, sensory and vision loss, and progressive left sided weakness. Pt s/p  thrombectomy and revascularization 4/21. Dys 2 diet and thin liquids was started by SLP 4/21. Pt underwent  R frontotemporal parietal hemicraniectomy on 4/24 after declining 4/23. Intubated 4/24; trach 5/1. Trach downsized to a #8 cuffless. PMH: HTN and DM  ?  ?Assessment / Plan / Recommendation  ?Clinical Impression ? Patient participated in brief evaluation of swallow function at bedside. He was fully awake, alert and cooperative. SLP completed oral care and removed clear saliva/secretions from anterior portion of oral cavity and lateral sulci. Patient only able to perform command to open mouth but unable to stick out tongue or initiate a swallow on command. SLP presented small ice chip to patient's lips and he was able to start lightly sucking on it and manipulating it with lips, but required SLP to gently push ice chip past lips and into anterior portion of mouth. Patient did exhibit very mild amount of oral motor movements with ice chip in mouth and although appeared to attempt, did not exhibit effective mastication. No swallow initiation was observed and patient did require redirection to adequately attend. Patient then exhibited delayed coughing and throat clearing which was brief overall and did not result in observed changes in vitals. SLP is recommending continued NPO status but will initiate PO trials with likely need for objective swallow study. ?SLP Visit Diagnosis: Dysphagia, oropharyngeal phase (R13.12) ?   ?Aspiration Risk ? Moderate aspiration risk  ?  ?Diet Recommendation NPO  ? ?Medication Administration: Via alternative means  ?  ?Other  Recommendations Oral Care Recommendations: Oral care QID;Staff/trained caregiver to provide oral care   ? ?Recommendations for follow up therapy are one component of a multi-disciplinary discharge planning process, led by the  attending physician.  Recommendations may be updated based on patient status, additional functional criteria and insurance authorization. ? ?Follow up Recommendations SLP at Long-term acute care hospital  ? ? ?  ?Assistance Recommended at  Discharge Frequent or constant Supervision/Assistance  ?Functional Status Assessment Patient has had a recent decline in their functional status and demonstrates the ability to make significant improvements in function in a reasonable and predictable amount of time.  ?Frequency and Duration min 2x/week  ?2 weeks ?  ?   ? ?Prognosis Prognosis for Safe Diet Advancement: Good ?Barriers to Reach Goals: Severity of deficits;Time post onset  ? ?  ? ?Swallow Study   ?General Date of Onset: 09/12/21 ?HPI: Pt is a 46 y.o. male with who presented with left sided facial droop. TNK given. MRI brain 4/21: Tiny acute infarcts in the upper division right MCA cortex. Worsening neuro status noted after MRI with left-sided neglect, right gaze preference, sensory and vision loss, and progressive left sided weakness. Pt s/p  thrombectomy and revascularization 4/21. Dys 2 diet and thin liquids was started by SLP 4/21. Pt underwent R frontotemporal parietal hemicraniectomy on 4/24 after declining 4/23. Intubated 4/24; trach 5/1. Trach downsized to a #8 cuffless. PMH: HTN and DM ?Type of Study: Bedside Swallow Evaluation ?Previous Swallow Assessment: 3 weeks ago (prior to intubation) ?Diet Prior to this Study: NPO ?Temperature Spikes Noted: No ?Respiratory Status: Trach Collar ?Trach Size and Type: Uncuffed;#8 ?History of Recent Intubation: Yes ?Date extubated:  (intubated 09/15/21; trach: 5/1) ?Behavior/Cognition: Alert;Cooperative ?Oral Cavity Assessment: Within Functional Limits ?Oral Care Completed by SLP: No ?Oral Cavity - Dentition: Adequate natural dentition ?Self-Feeding Abilities: Total assist ?Patient Positioning: Upright in bed ?Baseline Vocal Quality: Low vocal intensity;Other (comment) (limited amount of vocalizations and verbalizations) ?Volitional Cough: Cognitively unable to elicit ?Volitional Swallow: Unable to elicit  ?  ?Oral/Motor/Sensory Function Overall Oral Motor/Sensory Function: Other (comment) (patient able to  open mouth when requested but not able to follow any other commands for oral-motor movements)   ?Ice Chips Ice chips: Impaired ?Presentation: Spoon ?Oral Phase Impairments: Reduced labial seal;Reduced lingual movement/coordination;Impaired mastication ?Pharyngeal Phase Impairments: Unable to trigger swallow;Cough - Delayed;Throat Clearing - Delayed ?Other Comments: No swallow initiation observed with small ice chip   ?Thin Liquid Thin Liquid: Not tested  ?  ?Nectar Thick Nectar Thick Liquid: Not tested   ?Honey Thick Honey Thick Liquid: Not tested   ?Puree Puree: Not tested   ?Solid ? ? ?  Solid: Not tested  ? ?  ? ?Sonia Baller, MA, CCC-SLP ?Speech Therapy ? ? ? ? ?

## 2021-10-07 NOTE — Progress Notes (Signed)
:.  Show STROKE TEAM PROGRESS NOTE  ? ?INTERVAL HISTORY ?Wife at the bedside.  He eyes open on voice, able to wave to me using right hand. Able to show me thumbs up to express agreement with me. Vital stable. Still on TF, dietitian on board, will switch to bolus feeding. Still has hyperglycemia, insulin dose increased.  ? ?Vitals:  ? 10/07/21 0742 10/07/21 0756 10/07/21 1121 10/07/21 1159  ?BP: (!) 150/103  118/78   ?Pulse: 90 93 80 82  ?Resp: 20 15 19 18   ?Temp: 99.2 ?F (37.3 ?C)  99.3 ?F (37.4 ?C)   ?TempSrc: Oral  Axillary   ?SpO2: 98% 99% 97% 100%  ?Weight:      ?Height:      ? ?CBC:  ?Recent Labs  ?Lab 10/06/21 ?0825 10/07/21 ?0204  ?WBC 12.1* 11.7*  ?HGB 9.3* 10.1*  ?HCT 28.4* 31.0*  ?MCV 88.2 87.3  ?PLT 342 PLATELET CLUMPS NOTED ON SMEAR, UNABLE TO ESTIMATE  ? ?Basic Metabolic Panel:  ?Recent Labs  ?Lab 10/06/21 ?0825 10/07/21 ?0204  ?NA 135 137  ?K 3.8 4.4  ?CL 104 107  ?CO2 23 22  ?GLUCOSE 266* 195*  ?BUN 15 15  ?CREATININE 0.79 0.76  ?CALCIUM 8.5* 8.6*  ? ?Lipid Panel:  ?No results for input(s): CHOL, TRIG, HDL, CHOLHDL, VLDL, LDLCALC in the last 168 hours. ? ?HgbA1c:  ?No results for input(s): HGBA1C in the last 168 hours. ? ?Urine Drug Screen:  ?No results for input(s): LABOPIA, COCAINSCRNUR, LABBENZ, AMPHETMU, THCU, LABBARB in the last 168 hours. ?  ?Alcohol Level No results for input(s): ETH in the last 168 hours. ? ?IMAGING past 24 hours ?No results found. ? ?PHYSICAL EXAM ? ?Physical Exam  ?Constitutional: Appears well-developed and well-nourished middle-aged African-American male, hemi craniectomy surgical incision  ?Cardiovascular: Normal rate and regular rhythm.  ?Respiratory: Respirations regular and unlabored on trach collar ? ?Neuro: eyes open on voice, awake alert, mildly lethargic.  Right gaze preference but able to cross midline today, blinking to threat. Following commands on the right side and midline.  PERRL, on trach, not able to talk, left facial droop.  Dense left hemiplegia.   Antigravity strength on the right ? ?ASSESSMENT/PLAN ?Jacob [name] is a 46 y.o. male with history of HTN, DM2 presenting with left side facial droop, left arm weakness. Received TNKase. Neuro status declined during MRI (NIHSS 13),taken for thrombectomy with Dr. Karenann Cai. TICI3 recanalization of the right M2/MCA achieved.  Patient experienced cerebral edema post procedure, was returned to the ICU and underwent right hemicraniectomy.  Currently has tracheostomy and is receiving Eliquis for treatment of right peroneal vein DVT.  Plan to discharge to [facility] soon. ? ?Stroke:  Right MCA due to right MCA occlusion  s/p TNK and IR with TICI3, etiology not certain, but most likely cryptogenic versus intracranial atherosclerosis. ?Code Stroke CT head No acute abnormality ?CTA head & neck R short segment M1 occlusion, left distal M1 high grade stenosis ?Post IR CT No hemorrhagic complication on flat panel CT. ?4/21- Early MRI Tiny acute infarcts in the upper division right MCA cortex. ?4/22- MRI right MCA territory infarct involving the cortex, sparing the basal ganglia.  2 mm midline shift.  No hemorrhage ?4/22- MRA reoccluded right M2 branch, intracranial atherosclerosis including advanced left M1 stenosis ?4/24-  Head CT-increased edema resulting in near complete effacement of the right lateral ventricle and 12 mm leftward midline shift ?4/25- Head CT Post OP- 5 mm left midline shift, new area  of hypoattenuation of the left middle cerebellar peduncle and cerebellum ?4/28 head CT large right MCA infarct with no HT, diminished mass effect with no midline shift, hemicraniectomy noted ?5/3 CT head stable, no midline shift, large right MCA infarct ?2D Echo EF > 75% ?TEE- Consistent with intrapulmonary shunt ?LDL 96 ?HgbA1c 8.1 ?UDS negative ?VTE prophylaxis - SCDs ?No antithrombotic prior to admission, now on Eliquis 5mg  bid due to DVT.  ?Therapy recommendations: LTAC vs. CIR ?disposition:  Pending ? ?Cerebral  Edema s/p [facility] ?Head CT 4/24 shows increased edema with new 54mm midline shift ?Decompressive hemicraniectomy performed 4/24 by neurosurgery ?Remove staples on postop day 10 ?Neurosurgery plans to replace bone flap when swelling subsides, bone flap in abdomen ?No midline shift seen on head CT 4/28 and 5/3 ?Na stable now ?Off HTS  ?off Keppra per NSG ? ?Intracranial stenosis ?CTA head & neck R short segment M1 occlusion, left distal M1 high grade stenosis ?No significant extracranial stenosis ?Most likely related to long standing uncontrolled risk factors. ? ?Right Peroneal DVT ?High risk for thrombosis proximal extension ?Low risk of bleeding at this time ?Switch to Eliquis 5mg  bid today ? ?Hypertension ?Home meds:  Amlodipine 10mg , lisinopril 10mg -resumed ?Stable ?BP goal <160  ?On coreg 25, amlodipine 10, clonidine 0.2 Q8, lisinopril 40  ?PRN labetalol and hydralazine ?Long-term BP goal normotensive ? ?Hyperlipidemia ?Home meds:  Atorvastatin 20mg  ?LDL 96, goal < 70 ?Put on lipitor 40 ?Continue statin at discharge ? ?Diabetes type II Uncontrolled ?Home meds:  Glipizide, metformin ?HgbA1c 8.1, goal < 7.0 ?CBGs ?Still has hyperglycemia ?SSI with resist scale, Levemir increase to 40->50 units twice daily ?Novolog increase to 5->6u q 4hr->8U qid plus slide 0-20 -> 0-15 ?DM coordinator on board ? ?Other Stroke Risk Factors ?Obesity, Body mass index is 30.08 kg/m?., BMI >/= 30 associated with increased stroke risk, recommend weight loss, diet and exercise as appropriate  ? ?Dysphagia  ?Ileus, resolved ?Cortrak in place ?TF at 53ml/hr, prosource BID ?Dietitian on board, will start tube feeding ?Likely need PEG  ? ?Respiratory failure ?Patient left intubated after surgery ?Ventilator management per CCM ?Tracheostomy placed 5/1 ?On trach collar  ? ?Fever and leukocytosis ?Body rash ?Initial blood culture showed staph, now no growth as of 5/6 ?Afebrile now ?WBC 12.3-14.5-18.5-17.2 -> 13.9->10.7-> 13.1 -> 10.8-> 11.5-> 10.9  -> 11.4-> 12.1 ?Body rash - not chicken pox - isolation lifted ?VZV IgM-negative ?VZV PCR negative ?Tracheal aspirate + MRSA -> On vancomycin ->Switch to PO linezolid - continue for 2 weeks from negative culture on 5/6 (end date 10/11/2021) ? ?Post stroke depression ?started lexapro. ? ?Headaches ?Topamax 50mg  BID ?PRN oxycodone q4hrs ? ?Hospital day # 25 ? ? ?Rosalin Hawking, MD PhD ?Stroke Neurology ?10/07/2021 ?1:57 PM ? ? ? ?To contact Stroke Continuity provider, please refer to [email or web address] ?After hours, contact General Neurology ? ?

## 2021-10-07 NOTE — Progress Notes (Signed)
Nutrition Follow-up ? ?DOCUMENTATION CODES:  ?Not applicable ? ?INTERVENTION:  ?Consider PEG placement as cortrak NG tube has been in place for almost 3 weeks.  ?Adjust TF regimen to the following: ?Glucerna 1.5 bolus feeds of 1.5 cartons/343m 4x/d (14423mtotal volume/d) ?Flush with 6024mf free water before and after each bolus feed (120m3m/d) ?This regimen provides 2100 kcal, 119g of protein, 192g of carbs, and 1573mL26mfree water (TF + flush) ? ?NUTRITION DIAGNOSIS:  ?Inadequate oral intake related to inability to eat as evidenced by NPO status. ?- remains applicable ? ?GOAL:  ?Patient will meet greater than or equal to 90% of their needs ?- progressing, being met with TF ? ?MONITOR:  ?TF tolerance ? ?REASON FOR ASSESSMENT:  ?Consult ?Enteral/tube feeding initiation and management ("Please consider if he is candidate for bolus feeding") ? ?ASSESSMENT:  ?Pt with PMH of HTN and DM admitted with R M1 occlusion s/p TNK and thrombectomy. ? ?4/21 - cerebral angiogram and mechanical thrombectomy ?4/24 s/p R frontotemporal parietal hemicraniectomy, bone flap in abdomen, remained intubated post-procedure ?4/25 TF initiated  ?4/26 s/p cortrak placement; tip gastric  ?5/1 s/p trach placement; developed ileus and TF held ?5/5 restart TF at trickle ?5/10 - TF back at  goal rate of 60mL/16m?Pt continues on TC. [facility] Neurology team requests pt be assessed for bolus feeds. ? ?Pt tolerating current TF but CBGs are consistently quite high. DM education team has been adjusting regimen but glucose remains elevated. Will adjust formula to a lower carbohydrate product and adjust to bolus feeding so insulin can be scheduled to correspond with boluses. Noted that cortrak tube has been in place for almost 3 weeks. SLP working with pt on PMV trials, but pt is not tolerating and no POs have been attempted. Would likely benefit from PEG placement. Discussed with  attending.  ? ?Discussed adjusted TF regimen with RN and diabetes coordinator. ? ?Current TF Regimen: ?Vital 1.5 @ 60mL/h33m60mL/d)32mosource TF BID ?Provides 2420kcal, 127g of protein, 1193mL of 36m water ? ?Nutritionally Relevant Medications: ?Scheduled Meds: ? atorvastatin  40 mg Per Tube Daily  ? bisacodyl  10 mg Rectal Q1400  ? PROSource TF  45 mL Per Tube BID  ? insulin aspart  0-20 Units Subcutaneous Q4H  ? insulin aspart  6 Units Subcutaneous Q4H  ? insulin detemir  50 Units Subcutaneous BID  ? linezolid  600 mg Oral Q12H  ? pantoprazole sodium  40 mg Per Tube Daily  ? ?Continuous Infusions: ? feeding supplement (VITAL 1.5 CAL) 1,000 mL (10/06/21 1930)  ? ?PRN Meds: ondansetron, polyethylene glycol ? ?Labs Reviewed: ?CBG ranges from 188-294 mg/dL over the last 24 hours ?HgbA1c 8.1% (4/21) ? ?NUTRITION - FOCUSED PHYSICAL EXAM: ?Flowsheet Row Most Recent Value  ?Orbital Region No depletion  ?Upper Arm Region No depletion  ?Thoracic and Lumbar Region No depletion  ?Buccal Region No depletion  ?Temple Region No depletion  ?Clavicle Bone Region No depletion  ?Clavicle and Acromion Bone Region No depletion  ?Scapular Bone Region No depletion  ?Dorsal Hand No depletion  ?Patellar Region No depletion  ?Anterior Thigh Region No depletion  ?Posterior Calf Region No depletion  ?Edema (RD Assessment) None  ?Hair Reviewed  ?Eyes Unable to assess  ?Mouth Unable to assess  ?Skin Reviewed  ?Nails Reviewed  ? ? ?Diet Order:   ?Diet Order   ? ? None  ? ?  ? ? ?  EDUCATION NEEDS:  ?Not appropriate for education at this time ? ?Skin:  Skin Assessment: Reviewed RN Assessment (head incision, abd incision for bone flap) ? ?Last BM:  5/14, type 6 ? ?Height:  ?Ht Readings from Last 1 Encounters:  ?09/12/21 6' 1"  (1.854 m)  ? ? ?Weight:  ?Wt Readings from Last 1 Encounters:  ?10/07/21 103.4 kg  ? ? ?Ideal Body Weight:  83.6 kg ? ?BMI:  Body mass index is 30.08 kg/m?. ? ?Estimated Nutritional Needs:  ?Kcal:  2100-2500 ?Protein:   110-125 grams ?Fluid:  >2 L/day ? ? ?Ranell Patrick, RD, LDN ?Clinical Dietitian ?RD pager # available in [place]  ?After hours/weekend pager # available in [place] ?

## 2021-10-07 NOTE — Progress Notes (Signed)
Inpatient Diabetes Program Recommendations ? ?AACE/ADA: New Consensus Statement on Inpatient Glycemic Control (2015) ? ?Target Ranges:  Prepandial:   less than 140 mg/dL ?     Peak postprandial:   less than 180 mg/dL (1-2 hours) ?     Critically ill patients:  140 - 180 mg/dL  ? ?Lab Results  ?Component Value Date  ? GLUCAP 294 (H) 10/07/2021  ? HGBA1C 8.1 (H) 09/12/2021  ? ? ?Review of Glycemic Control ? ?Inpatient Diabetes Program Recommendations:   ?Tube feeding changed to glucerna 1.5 boluses. Each bolus will have 48g of carbs for a total of 192g/d. ?Please consider: ?-Change tube feed Novolog to 8 units qid ?-May need to adjust Novolog correction to 0-15 units q 4 hrs. ? ?Thank you, ?Nani Gasser Season Astacio, RN, MSN, CDE  ?Diabetes Coordinator ?Inpatient Glycemic Control Team ?Team Pager [phone] (8am-5pm) ?10/07/2021 11:38 AM ? ? ? ? ?

## 2021-10-08 DIAGNOSIS — Z9981 Dependence on supplemental oxygen: Secondary | ICD-10-CM | POA: Diagnosis not present

## 2021-10-08 DIAGNOSIS — I69398 Other sequelae of cerebral infarction: Secondary | ICD-10-CM | POA: Diagnosis not present

## 2021-10-08 DIAGNOSIS — R1312 Dysphagia, oropharyngeal phase: Secondary | ICD-10-CM | POA: Diagnosis not present

## 2021-10-08 DIAGNOSIS — L03311 Cellulitis of abdominal wall: Secondary | ICD-10-CM | POA: Diagnosis not present

## 2021-10-08 DIAGNOSIS — M2550 Pain in unspecified joint: Secondary | ICD-10-CM | POA: Diagnosis not present

## 2021-10-08 DIAGNOSIS — T8189XA Other complications of procedures, not elsewhere classified, initial encounter: Secondary | ICD-10-CM | POA: Diagnosis not present

## 2021-10-08 DIAGNOSIS — F339 Major depressive disorder, recurrent, unspecified: Secondary | ICD-10-CM | POA: Diagnosis not present

## 2021-10-08 DIAGNOSIS — I672 Cerebral atherosclerosis: Secondary | ICD-10-CM | POA: Diagnosis not present

## 2021-10-08 DIAGNOSIS — E7800 Pure hypercholesterolemia, unspecified: Secondary | ICD-10-CM | POA: Diagnosis not present

## 2021-10-08 DIAGNOSIS — I6789 Other cerebrovascular disease: Secondary | ICD-10-CM | POA: Diagnosis not present

## 2021-10-08 DIAGNOSIS — R2689 Other abnormalities of gait and mobility: Secondary | ICD-10-CM | POA: Diagnosis not present

## 2021-10-08 DIAGNOSIS — I10 Essential (primary) hypertension: Secondary | ICD-10-CM | POA: Diagnosis not present

## 2021-10-08 DIAGNOSIS — I96 Gangrene, not elsewhere classified: Secondary | ICD-10-CM | POA: Diagnosis not present

## 2021-10-08 DIAGNOSIS — R1311 Dysphagia, oral phase: Secondary | ICD-10-CM | POA: Diagnosis not present

## 2021-10-08 DIAGNOSIS — I63311 Cerebral infarction due to thrombosis of right middle cerebral artery: Secondary | ICD-10-CM | POA: Diagnosis not present

## 2021-10-08 DIAGNOSIS — G9349 Other encephalopathy: Secondary | ICD-10-CM | POA: Diagnosis not present

## 2021-10-08 DIAGNOSIS — I82402 Acute embolism and thrombosis of unspecified deep veins of left lower extremity: Secondary | ICD-10-CM | POA: Diagnosis not present

## 2021-10-08 DIAGNOSIS — G44309 Post-traumatic headache, unspecified, not intractable: Secondary | ICD-10-CM | POA: Diagnosis not present

## 2021-10-08 DIAGNOSIS — Z7401 Bed confinement status: Secondary | ICD-10-CM | POA: Diagnosis not present

## 2021-10-08 DIAGNOSIS — I639 Cerebral infarction, unspecified: Secondary | ICD-10-CM | POA: Diagnosis not present

## 2021-10-08 DIAGNOSIS — I69320 Aphasia following cerebral infarction: Secondary | ICD-10-CM | POA: Diagnosis not present

## 2021-10-08 DIAGNOSIS — L89611 Pressure ulcer of right heel, stage 1: Secondary | ICD-10-CM | POA: Diagnosis not present

## 2021-10-08 DIAGNOSIS — J189 Pneumonia, unspecified organism: Secondary | ICD-10-CM | POA: Diagnosis not present

## 2021-10-08 DIAGNOSIS — I69354 Hemiplegia and hemiparesis following cerebral infarction affecting left non-dominant side: Secondary | ICD-10-CM | POA: Diagnosis not present

## 2021-10-08 DIAGNOSIS — G936 Cerebral edema: Secondary | ICD-10-CM | POA: Diagnosis not present

## 2021-10-08 DIAGNOSIS — R079 Chest pain, unspecified: Secondary | ICD-10-CM | POA: Diagnosis not present

## 2021-10-08 DIAGNOSIS — R5381 Other malaise: Secondary | ICD-10-CM | POA: Diagnosis not present

## 2021-10-08 DIAGNOSIS — E118 Type 2 diabetes mellitus with unspecified complications: Secondary | ICD-10-CM | POA: Diagnosis not present

## 2021-10-08 DIAGNOSIS — F411 Generalized anxiety disorder: Secondary | ICD-10-CM | POA: Diagnosis not present

## 2021-10-08 DIAGNOSIS — I69891 Dysphagia following other cerebrovascular disease: Secondary | ICD-10-CM | POA: Diagnosis not present

## 2021-10-08 DIAGNOSIS — I69391 Dysphagia following cerebral infarction: Secondary | ICD-10-CM | POA: Diagnosis not present

## 2021-10-08 DIAGNOSIS — I69822 Dysarthria following other cerebrovascular disease: Secondary | ICD-10-CM | POA: Diagnosis not present

## 2021-10-08 DIAGNOSIS — I63511 Cerebral infarction due to unspecified occlusion or stenosis of right middle cerebral artery: Secondary | ICD-10-CM | POA: Diagnosis not present

## 2021-10-08 DIAGNOSIS — M6281 Muscle weakness (generalized): Secondary | ICD-10-CM | POA: Diagnosis not present

## 2021-10-08 DIAGNOSIS — Z978 Presence of other specified devices: Secondary | ICD-10-CM | POA: Diagnosis not present

## 2021-10-08 DIAGNOSIS — I82451 Acute embolism and thrombosis of right peroneal vein: Secondary | ICD-10-CM | POA: Diagnosis not present

## 2021-10-08 DIAGNOSIS — I63411 Cerebral infarction due to embolism of right middle cerebral artery: Secondary | ICD-10-CM | POA: Diagnosis not present

## 2021-10-08 DIAGNOSIS — R1310 Dysphagia, unspecified: Secondary | ICD-10-CM | POA: Diagnosis not present

## 2021-10-08 DIAGNOSIS — R066 Hiccough: Secondary | ICD-10-CM | POA: Diagnosis not present

## 2021-10-08 DIAGNOSIS — I63412 Cerebral infarction due to embolism of left middle cerebral artery: Secondary | ICD-10-CM | POA: Diagnosis not present

## 2021-10-08 DIAGNOSIS — Z930 Tracheostomy status: Secondary | ICD-10-CM | POA: Diagnosis not present

## 2021-10-08 DIAGNOSIS — J9621 Acute and chronic respiratory failure with hypoxia: Secondary | ICD-10-CM | POA: Diagnosis not present

## 2021-10-08 DIAGNOSIS — R000 Tachycardia, unspecified: Secondary | ICD-10-CM | POA: Diagnosis not present

## 2021-10-08 DIAGNOSIS — J15212 Pneumonia due to Methicillin resistant Staphylococcus aureus: Secondary | ICD-10-CM | POA: Diagnosis not present

## 2021-10-08 DIAGNOSIS — E1152 Type 2 diabetes mellitus with diabetic peripheral angiopathy with gangrene: Secondary | ICD-10-CM | POA: Diagnosis not present

## 2021-10-08 DIAGNOSIS — K9423 Gastrostomy malfunction: Secondary | ICD-10-CM | POA: Diagnosis not present

## 2021-10-08 DIAGNOSIS — G43611 Persistent migraine aura with cerebral infarction, intractable, with status migrainosus: Secondary | ICD-10-CM | POA: Diagnosis not present

## 2021-10-08 DIAGNOSIS — J398 Other specified diseases of upper respiratory tract: Secondary | ICD-10-CM | POA: Diagnosis not present

## 2021-10-08 DIAGNOSIS — K9422 Gastrostomy infection: Secondary | ICD-10-CM | POA: Diagnosis not present

## 2021-10-08 LAB — GLUCOSE, CAPILLARY
Glucose-Capillary: 99 mg/dL (ref 70–99)
Glucose-Capillary: 123 mg/dL — ABNORMAL HIGH (ref 70–99)
Glucose-Capillary: 188 mg/dL — ABNORMAL HIGH (ref 70–99)

## 2021-10-08 LAB — BASIC METABOLIC PANEL
Anion gap: 11 (ref 5–15)
BUN: 20 mg/dL (ref 6–20)
CO2: 25 mmol/L (ref 22–32)
Calcium: 9.1 mg/dL (ref 8.9–10.3)
Chloride: 104 mmol/L (ref 98–111)
Creatinine, Ser: 0.88 mg/dL (ref 0.61–1.24)
GFR, Estimated: >60 mL/min (ref >60)
Glucose, Bld: 111 mg/dL — ABNORMAL HIGH (ref 70–99)
Potassium: 3.7 mmol/L (ref 3.5–5.1)
Sodium: 140 mmol/L (ref 135–145)

## 2021-10-08 LAB — CBC
HCT: 32.1 % — ABNORMAL LOW (ref 39.0–52.0)
Hemoglobin: 10.4 g/dL — ABNORMAL LOW (ref 13.0–17.0)
MCH: 29 pg (ref 26.0–34.0)
MCHC: 32.4 g/dL (ref 30.0–36.0)
MCV: 89.4 fL (ref 80.0–100.0)
Platelets: 322 10*3/uL (ref 150–400)
RBC: 3.59 MIL/uL — ABNORMAL LOW (ref 4.22–5.81)
RDW: 13.4 % (ref 11.5–15.5)
WBC: 10.3 10*3/uL (ref 4.0–10.5)
nRBC: 0 % (ref 0.0–0.2)

## 2021-10-08 MED ORDER — AMLODIPINE BESYLATE 10 MG PO TABS: 10.0000 mg | ORAL_TABLET | Freq: Every day | ORAL | 1 refills | Status: DC

## 2021-10-08 MED ORDER — APIXABAN 5 MG PO TABS: 5.0000 mg | ORAL_TABLET | Freq: Two times a day (BID) | ORAL | 1 refills | Status: DC

## 2021-10-08 MED ORDER — LINEZOLID 600 MG PO TABS: 600.0000 mg | ORAL_TABLET | Freq: Two times a day (BID) | ORAL | 0 refills | Status: DC

## 2021-10-08 MED ORDER — TOPIRAMATE 50 MG PO TABS: 50.0000 mg | ORAL_TABLET | Freq: Two times a day (BID) | ORAL | 1 refills | Status: DC

## 2021-10-08 MED ORDER — INSULIN ASPART 100 UNIT/ML IJ SOLN
0.0000 [IU] | INTRAMUSCULAR | 11 refills | Status: DC
Start: 2021-10-08 — End: 2022-02-10

## 2021-10-08 MED ORDER — ESCITALOPRAM OXALATE 20 MG PO TABS: 20.0000 mg | ORAL_TABLET | Freq: Every day | ORAL | 1 refills | Status: DC

## 2021-10-08 MED ORDER — CHLORPROMAZINE HCL 10 MG PO TABS: 10.0000 mg | ORAL_TABLET | Freq: Three times a day (TID) | ORAL | 1 refills | Status: DC | PRN

## 2021-10-08 MED ORDER — CARVEDILOL 25 MG PO TABS: 25.0000 mg | ORAL_TABLET | Freq: Two times a day (BID) | ORAL | 1 refills | Status: DC

## 2021-10-08 MED ORDER — LINEZOLID 600 MG PO TABS
600.0000 mg | ORAL_TABLET | Freq: Two times a day (BID) | ORAL | Status: DC
  Administered 2021-10-08: 600 mg
  Filled 2021-10-08 (×2): qty 1

## 2021-10-08 MED ORDER — ACETAMINOPHEN 160 MG/5ML PO SOLN: 650.0000 mg | ORAL | 0 refills | Status: DC | PRN

## 2021-10-08 MED ORDER — LISINOPRIL 40 MG PO TABS: 40.0000 mg | ORAL_TABLET | Freq: Every day | ORAL | 1 refills | Status: DC

## 2021-10-08 MED ORDER — ONDANSETRON HCL 4 MG/2ML IJ SOLN: 4.0000 mg | INTRAMUSCULAR | 0 refills | Status: DC | PRN

## 2021-10-08 MED ORDER — BISACODYL 10 MG RE SUPP: 10.0000 mg | Freq: Every day | RECTAL | 0 refills | Status: DC

## 2021-10-08 MED ORDER — ALBUTEROL SULFATE (2.5 MG/3ML) 0.083% IN NEBU: 2.5000 mg | INHALATION_SOLUTION | Freq: Four times a day (QID) | RESPIRATORY_TRACT | 12 refills | Status: DC | PRN

## 2021-10-08 MED ORDER — CLONIDINE 0.3 MG/24HR TD PTWK: 0.3000 mg | MEDICATED_PATCH | TRANSDERMAL | 12 refills | Status: DC

## 2021-10-08 MED ORDER — INSULIN DETEMIR 100 UNIT/ML ~~LOC~~ SOLN: 50.0000 [IU] | Freq: Two times a day (BID) | SUBCUTANEOUS | 11 refills | Status: DC

## 2021-10-08 MED ORDER — POLYETHYLENE GLYCOL 3350 17 G PO PACK: 17.0000 g | PACK | Freq: Every day | ORAL | 0 refills | Status: DC | PRN

## 2021-10-08 MED ORDER — INSULIN ASPART 100 UNIT/ML IJ SOLN: 8.0000 [IU] | Freq: Four times a day (QID) | INTRAMUSCULAR | 11 refills | Status: DC

## 2021-10-08 MED ORDER — PANTOPRAZOLE SODIUM 40 MG PO PACK: 40.0000 mg | PACK | Freq: Every day | ORAL | 1 refills | Status: DC

## 2021-10-08 MED ORDER — ATORVASTATIN CALCIUM 40 MG PO TABS: 40.0000 mg | ORAL_TABLET | Freq: Every day | ORAL | 1 refills | Status: DC

## 2021-10-08 NOTE — TOC Progression Note (Addendum)
Transition of Care (TOC) - Progression Note  ? ? ?Patient Details  ?Name: Jacob [name] ?MRN: [id number] ?Date of Birth: 24-Jul-1975 ? ?Transition of Care (TOC) CM/SW Contact  ?Curlene Labrum, RN ?Phone Number: ?10/08/2021, 9:44 AM ? ?Clinical Narrative:    ?CM spoke with Burgess Estelle, CM with [facility] and insurance authorization has been approved for the patient and bed is available for admission today.  Shon Baton, MSW notified Dr. Erlinda Hong and requested discharge orders and discharge summary for transfer of care to the facility. ? ?[facility] packet will be provided at the secretary station for transfer documents - including Discharge orders, discharge summary, completed EMTALA and Medical necessity form.  EMTALA form will be completed by nursing staff and provided since the patient is transferring to [facility] facility and is an acute to acute facility transfer.. Dr. Reesa Chew is the accepting physician at [facility].  Bedside nursing - Please call report to [facility] at [phone] or [phone]. ? ?CM spoke with Randel Books, NP and she called [facility] and left a message with Dr. Reesa Chew for provider to provide report at [facility]. ? ?Burgess Estelle, CM with [facility] is aware that discharge summary and orders are complete prior to discharge and [facility] has access to Epic chart for coordination of care and accepting discharge summary upon arrival. ? ?Bedside nursing to send the patient with extra 8.5 trach.  Patient is currently trach collar at 5 L/min and will be provided en route to the facility by [facility] as well.  Cortrak is in place and PIV will be intact for the transfer as well and bedside nursing is aware. ? ?[facility] was called and 1 pm transport time was coordinated.   ? ?I called the patient's wife, Jacob [name], and she is aware that patient will be discharged to the facility today by ambulance.  Patient was also notified in person at the bedside. ? ?CM and MSW with DTP Team will continue to  follow the patient for discharge to [place] today. ? ? ?Expected Discharge Plan: [place] (LTAC) ?Barriers to Discharge: Continued Medical Work up ? ?Expected Discharge Plan and Services ?Expected Discharge Plan: [place] (LTAC) ?In-house Referral: Clinical Social Work, Development worker, community ?Discharge Planning Services: CM Consult ?Post Acute Care Choice: Long Term Acute Care (LTAC) ?Living arrangements for the past 2 months: [place] ?                ?  ?  ?  ?  ?  ?  ?  ?  ?  ?  ? ? ?Social Determinants of Health (SDOH) Interventions ?  ? ?Readmission Risk Interventions ?   ? View : No data to display.  ?  ?  ?  ? ? ?

## 2021-10-08 NOTE — Discharge Summary (Addendum)
Stroke Discharge Summary  ?Patient ID: Jacob [name]    l ?  MRN: [id number]    ?  DOB: 10-05-1975 ? ?Date of Admission: 09/12/2021 ?Date of Discharge: 10/08/2021 ? ?Attending Physician:  Stroke, Md, MD, Stroke MD ?Consultant(s):   Treatment Team:  ?Newman Pies, MD pulmonary/intensive care, ID, and neurosurgery   ?Patient's PCP:  Charlott Rakes, MD ? ?DISCHARGE DIAGNOSIS: Stroke:   ?Right MCA due to right MCA occlusion  s/p TNK and IR with TICI3, etiology not certain, but most likely cryptogenic versus intracranial atherosclerosis. ? ?Secondary diagnosis: ?  Cerebral edema ([place]) s/p craniectomy  ?  Diabetes mellitus, uncontrolled ([place]) ?  Essential hypertension ?  Hyperlipidemia ?  RLE DVT ?  Acute respiratory failure with hypoxia ([place]) ?  S/p tracheostomy  ?  Dysphagia  ?  Fever ?  Pneumonia of both lower lobes due to methicillin resistant Staphylococcus aureus (MRSA) ([place]) ? ? ?Allergies as of 10/08/2021   ? ?   Reactions  ? Hydrochlorothiazide Other (See Comments)  ? Bilateral muscle cramping  ? ?  ? ?  ?Medication List  ?  ? ?STOP taking these medications   ? ?clotrimazole 1 % cream ?Commonly known as: LOTRIMIN ?  ?glipiZIDE 10 MG tablet ?Commonly known as: GLUCOTROL ?  ?ibuprofen 200 MG tablet ?Commonly known as: ADVIL ?  ?metFORMIN 500 MG tablet ?Commonly known as: GLUCOPHAGE ?  ?True Metrix Blood Glucose Test test strip ?Generic drug: glucose blood ?  ?True Metrix Meter w/Device Kit ?  ?TRUEplus Lancets 28G Misc ?  ?Vitamin D (Ergocalciferol) 1.25 MG (50000 UNIT) Caps capsule ?Commonly known as: DRISDOL ?  ? ?  ? ?TAKE these medications   ? ?acetaminophen 160 MG/5ML solution ?Commonly known as: TYLENOL ?Place 20.3 mLs (650 mg total) into feeding tube every 4 (four) hours as needed for mild pain (or temp > 37.5 C (99.5 F)). ?  ?albuterol (2.5 MG/3ML) 0.083% nebulizer solution ?Commonly known as: PROVENTIL ?Take 3 mLs (2.5 mg total) by nebulization every 6 (six) hours as needed for wheezing or shortness of  breath. ?  ?amLODipine 10 MG tablet ?Commonly known as: NORVASC ?Place 1 tablet (10 mg total) into feeding tube daily. ?Start taking on: Oct 09, 2021 ?What changed: how to take this ?  ?apixaban 5 MG Tabs tablet ?Commonly known as: ELIQUIS ?Take 1 tablet (5 mg total) by mouth 2 (two) times daily. ?  ?atorvastatin 40 MG tablet ?Commonly known as: LIPITOR ?Place 1 tablet (40 mg total) into feeding tube daily. ?Start taking on: Oct 09, 2021 ?What changed:  ?medication strength ?how much to take ?how to take this ?  ?bisacodyl 10 MG suppository ?Commonly known as: DULCOLAX ?Place 1 suppository (10 mg total) rectally daily at 2 PM. ?  ?carvedilol 25 MG tablet ?Commonly known as: COREG ?Place 1 tablet (25 mg total) into feeding tube 2 (two) times daily with a meal. ?  ?chlorproMAZINE 10 MG tablet ?Commonly known as: THORAZINE ?Place 1 tablet (10 mg total) into feeding tube 3 (three) times daily as needed for hiccoughs. ?  ?cloNIDine 0.3 mg/24hr patch ?Commonly known as: CATAPRES - Dosed in mg/24 hr ?Place 1 patch (0.3 mg total) onto the skin once a week. ?Start taking on: Oct 09, 2021 ?  ?escitalopram 20 MG tablet ?Commonly known as: LEXAPRO ?Place 1 tablet (20 mg total) into feeding tube daily. ?Start taking on: Oct 09, 2021 ?  ?insulin aspart 100 UNIT/ML injection ?Commonly known as: novoLOG ?Inject 8 Units into  the skin 4 (four) times daily. ?  ?insulin aspart 100 UNIT/ML injection ?Commonly known as: novoLOG ?Inject 0-15 Units into the skin every 4 (four) hours. ?  ?insulin detemir 100 UNIT/ML injection ?Commonly known as: LEVEMIR ?Inject 0.5 mLs (50 Units total) into the skin 2 (two) times daily. ?  ?linezolid 600 MG tablet ?Commonly known as: ZYVOX ?Place 1 tablet (600 mg total) into feeding tube every 12 (twelve) hours. ?  ?lisinopril 40 MG tablet ?Commonly known as: ZESTRIL ?Place 1 tablet (40 mg total) into feeding tube daily. ?Start taking on: Oct 09, 2021 ?What changed:  ?medication strength ?how much to  take ?how to take this ?  ?ondansetron 4 MG/2ML Soln injection ?Commonly known as: ZOFRAN ?Inject 2 mLs (4 mg total) into the vein every 4 (four) hours as needed for nausea or vomiting. ?  ?pantoprazole sodium 40 mg ?Commonly known as: PROTONIX ?Place 40 mg into feeding tube daily. ?  ?polyethylene glycol 17 g packet ?Commonly known as: MIRALAX / GLYCOLAX ?Place 17 g into feeding tube daily as needed for mild constipation, moderate constipation or severe constipation. ?  ?topiramate 50 MG tablet ?Commonly known as: TOPAMAX ?Place 1 tablet (50 mg total) into feeding tube 2 (two) times daily. ?  ? ?  ? ? ?LABORATORY STUDIES ?CBC ?   ?Component Value Date/Time  ? WBC 10.3 10/08/2021 0318  ? RBC 3.59 (L) 10/08/2021 0318  ? HGB 10.4 (L) 10/08/2021 0318  ? HGB 15.8 10/31/2020 1157  ? HCT 32.1 (L) 10/08/2021 0318  ? HCT 46.5 10/31/2020 1157  ? PLT 322 10/08/2021 0318  ? PLT 188 10/31/2020 1157  ? MCV 89.4 10/08/2021 0318  ? MCV 85 10/31/2020 1157  ? MCH 29.0 10/08/2021 0318  ? MCHC 32.4 10/08/2021 0318  ? RDW 13.4 10/08/2021 0318  ? RDW 13.5 10/31/2020 1157  ? LYMPHSABS 1.2 09/26/2021 0405  ? LYMPHSABS 2.3 10/31/2020 1157  ? MONOABS 1.3 (H) 09/26/2021 0405  ? EOSABS 0.2 09/26/2021 0405  ? EOSABS 0.1 10/31/2020 1157  ? BASOSABS 0.0 09/26/2021 0405  ? BASOSABS 0.0 10/31/2020 1157  ? ?CMP ?   ?Component Value Date/Time  ? NA 140 10/08/2021 0318  ? NA 140 03/20/2021 1119  ? K 3.7 10/08/2021 0318  ? CL 104 10/08/2021 0318  ? CO2 25 10/08/2021 0318  ? GLUCOSE 111 (H) 10/08/2021 0318  ? BUN 20 10/08/2021 0318  ? BUN 13 03/20/2021 1119  ? CREATININE 0.88 10/08/2021 0318  ? CREATININE 1.05 11/19/2011 0858  ? CALCIUM 9.1 10/08/2021 0318  ? PROT 6.5 09/27/2021 0326  ? PROT 7.2 03/20/2021 1119  ? ALBUMIN 1.8 (L) 09/27/2021 0326  ? ALBUMIN 1.8 (L) 09/27/2021 0326  ? ALBUMIN 4.2 03/20/2021 1119  ? AST 97 (H) 09/27/2021 0326  ? ALT 35 09/27/2021 0326  ? ALKPHOS 80 09/27/2021 0326  ? BILITOT 1.1 09/27/2021 0326  ? BILITOT 0.9 03/20/2021  1119  ? GFRNONAA >60 10/08/2021 0318  ? GFRAA 102 06/28/2018 1240  ? ?COAGS ?Lab Results  ?Component Value Date  ? INR 1.0 09/12/2021  ? ?Lipid Panel ?   ?Component Value Date/Time  ? CHOL 191 09/12/2021 0356  ? CHOL 147 03/20/2021 1119  ? TRIG 212 (H) 09/18/2021 2143  ? HDL 38 (L) 09/12/2021 0356  ? HDL 44 03/20/2021 1119  ? CHOLHDL 5.0 09/12/2021 0356  ? VLDL 57 (H) 09/12/2021 0356  ? [place] 96 09/12/2021 0356  ? [place] 81 03/20/2021 1119  ? ?HgbA1C  ?Lab Results  ?  Component Value Date  ? HGBA1C 8.1 (H) 09/12/2021  ? ?Urinalysis ?   ?Component Value Date/Time  ? [place] YELLOW 10/23/2013 2108  ? APPEARANCEUR CLEAR 10/23/2013 2108  ? LABSPEC 1.026 10/23/2013 2108  ? PHURINE 7.0 10/23/2013 2108  ? [place] NEGATIVE 10/23/2013 2108  ? [place] NEGATIVE 10/23/2013 2108  ? BILIRUBINUR negative 02/23/2018 1159  ? KETONESUR negative 02/23/2018 1159  ? [place] NEGATIVE 10/23/2013 2108  ? PROTEINUR 30 (A) 10/23/2013 2108  ? UROBILINOGEN 1.0 02/23/2018 1159  ? UROBILINOGEN 1.0 10/23/2013 2108  ? NITRITE Negative 02/23/2018 1159  ? NITRITE NEGATIVE 10/23/2013 2108  ? LEUKOCYTESUR Negative 02/23/2018 1159  ? ?Urine Drug Screen  ?   ?Component Value Date/Time  ? LABOPIA NONE DETECTED 09/12/2021 1437  ? COCAINSCRNUR NONE DETECTED 09/12/2021 1437  ? LABBENZ NONE DETECTED 09/12/2021 1437  ? AMPHETMU NONE DETECTED 09/12/2021 1437  ? THCU NONE DETECTED 09/12/2021 1437  ? LABBARB NONE DETECTED 09/12/2021 1437  ?  ?Alcohol Level ?No results found for: ETH ? ? ?SIGNIFICANT DIAGNOSTIC STUDIES ?DG Abd 1 View ? ?Result Date: 09/30/2021 ?CLINICAL DATA:  Ileus EXAM: ABDOMEN - 1 VIEW COMPARISON:  09/28/2021 FINDINGS: 0835 hours. Feeding tube tip is in the distal stomach. Diffuse gaseous distention of large and small bowel is similar to prior. Gas pattern remains compatible with ileus. IMPRESSION: No substantial change in diffuse gaseous distention of large and small bowel compatible with ileus. Electronically Signed   By: Misty Stanley  M.D.   On: 09/30/2021 09:06  ? ?DG Abd 1 View ? ?Result Date: 09/28/2021 ?CLINICAL DATA:  Ileus. EXAM: ABDOMEN - 1 VIEW COMPARISON:  09/24/2021 FINDINGS: Tip of the weighted enteric tube below the diaphragm in the

## 2021-10-08 NOTE — Progress Notes (Signed)
Report called to Kathlee Nations at [facility].  Patient belongings packed and wife at bedside for transport. ?

## 2021-10-09 DIAGNOSIS — J9621 Acute and chronic respiratory failure with hypoxia: Secondary | ICD-10-CM

## 2021-10-09 DIAGNOSIS — Z930 Tracheostomy status: Secondary | ICD-10-CM

## 2021-10-09 DIAGNOSIS — I82402 Acute embolism and thrombosis of unspecified deep veins of left lower extremity: Secondary | ICD-10-CM

## 2021-10-09 DIAGNOSIS — J189 Pneumonia, unspecified organism: Secondary | ICD-10-CM

## 2021-10-10 DIAGNOSIS — Z930 Tracheostomy status: Secondary | ICD-10-CM

## 2021-10-10 DIAGNOSIS — J189 Pneumonia, unspecified organism: Secondary | ICD-10-CM

## 2021-10-10 DIAGNOSIS — I82402 Acute embolism and thrombosis of unspecified deep veins of left lower extremity: Secondary | ICD-10-CM

## 2021-10-10 DIAGNOSIS — J9621 Acute and chronic respiratory failure with hypoxia: Secondary | ICD-10-CM

## 2021-10-11 DIAGNOSIS — Z930 Tracheostomy status: Secondary | ICD-10-CM

## 2021-10-11 DIAGNOSIS — J189 Pneumonia, unspecified organism: Secondary | ICD-10-CM

## 2021-10-11 DIAGNOSIS — I82402 Acute embolism and thrombosis of unspecified deep veins of left lower extremity: Secondary | ICD-10-CM

## 2021-10-11 DIAGNOSIS — J9621 Acute and chronic respiratory failure with hypoxia: Secondary | ICD-10-CM

## 2021-10-12 DIAGNOSIS — J9621 Acute and chronic respiratory failure with hypoxia: Secondary | ICD-10-CM

## 2021-10-12 DIAGNOSIS — J189 Pneumonia, unspecified organism: Secondary | ICD-10-CM

## 2021-10-12 DIAGNOSIS — Z930 Tracheostomy status: Secondary | ICD-10-CM

## 2021-10-12 DIAGNOSIS — I82402 Acute embolism and thrombosis of unspecified deep veins of left lower extremity: Secondary | ICD-10-CM

## 2021-10-13 DIAGNOSIS — I82402 Acute embolism and thrombosis of unspecified deep veins of left lower extremity: Secondary | ICD-10-CM

## 2021-10-13 DIAGNOSIS — Z930 Tracheostomy status: Secondary | ICD-10-CM

## 2021-10-13 DIAGNOSIS — J189 Pneumonia, unspecified organism: Secondary | ICD-10-CM

## 2021-10-13 DIAGNOSIS — J9621 Acute and chronic respiratory failure with hypoxia: Secondary | ICD-10-CM

## 2021-10-14 DIAGNOSIS — I82402 Acute embolism and thrombosis of unspecified deep veins of left lower extremity: Secondary | ICD-10-CM

## 2021-10-14 DIAGNOSIS — J189 Pneumonia, unspecified organism: Secondary | ICD-10-CM

## 2021-10-14 DIAGNOSIS — Z930 Tracheostomy status: Secondary | ICD-10-CM

## 2021-10-14 DIAGNOSIS — J9621 Acute and chronic respiratory failure with hypoxia: Secondary | ICD-10-CM

## 2021-10-15 DIAGNOSIS — J9621 Acute and chronic respiratory failure with hypoxia: Secondary | ICD-10-CM

## 2021-10-15 DIAGNOSIS — Z930 Tracheostomy status: Secondary | ICD-10-CM

## 2021-10-15 DIAGNOSIS — I82402 Acute embolism and thrombosis of unspecified deep veins of left lower extremity: Secondary | ICD-10-CM

## 2021-10-15 DIAGNOSIS — J189 Pneumonia, unspecified organism: Secondary | ICD-10-CM

## 2021-10-16 ENCOUNTER — Ambulatory Visit: Payer: BC Managed Care – PPO | Admitting: Family Medicine

## 2021-10-23 DIAGNOSIS — Z930 Tracheostomy status: Secondary | ICD-10-CM

## 2021-10-23 DIAGNOSIS — J9621 Acute and chronic respiratory failure with hypoxia: Secondary | ICD-10-CM

## 2021-10-23 DIAGNOSIS — I82402 Acute embolism and thrombosis of unspecified deep veins of left lower extremity: Secondary | ICD-10-CM

## 2021-10-23 DIAGNOSIS — J189 Pneumonia, unspecified organism: Secondary | ICD-10-CM

## 2021-10-24 DIAGNOSIS — I82402 Acute embolism and thrombosis of unspecified deep veins of left lower extremity: Secondary | ICD-10-CM

## 2021-10-24 DIAGNOSIS — J9621 Acute and chronic respiratory failure with hypoxia: Secondary | ICD-10-CM

## 2021-10-24 DIAGNOSIS — Z930 Tracheostomy status: Secondary | ICD-10-CM

## 2021-10-24 DIAGNOSIS — J189 Pneumonia, unspecified organism: Secondary | ICD-10-CM

## 2021-10-25 DIAGNOSIS — I82402 Acute embolism and thrombosis of unspecified deep veins of left lower extremity: Secondary | ICD-10-CM

## 2021-10-25 DIAGNOSIS — J189 Pneumonia, unspecified organism: Secondary | ICD-10-CM

## 2021-10-25 DIAGNOSIS — J9621 Acute and chronic respiratory failure with hypoxia: Secondary | ICD-10-CM

## 2021-10-25 DIAGNOSIS — Z930 Tracheostomy status: Secondary | ICD-10-CM

## 2021-10-26 DIAGNOSIS — J189 Pneumonia, unspecified organism: Secondary | ICD-10-CM

## 2021-10-26 DIAGNOSIS — Z930 Tracheostomy status: Secondary | ICD-10-CM

## 2021-10-26 DIAGNOSIS — J9621 Acute and chronic respiratory failure with hypoxia: Secondary | ICD-10-CM

## 2021-10-26 DIAGNOSIS — I82402 Acute embolism and thrombosis of unspecified deep veins of left lower extremity: Secondary | ICD-10-CM

## 2021-10-27 DIAGNOSIS — Z930 Tracheostomy status: Secondary | ICD-10-CM

## 2021-10-27 DIAGNOSIS — J9621 Acute and chronic respiratory failure with hypoxia: Secondary | ICD-10-CM

## 2021-10-27 DIAGNOSIS — J189 Pneumonia, unspecified organism: Secondary | ICD-10-CM

## 2021-10-27 DIAGNOSIS — I82402 Acute embolism and thrombosis of unspecified deep veins of left lower extremity: Secondary | ICD-10-CM

## 2021-10-28 DIAGNOSIS — J189 Pneumonia, unspecified organism: Secondary | ICD-10-CM

## 2021-10-28 DIAGNOSIS — I82402 Acute embolism and thrombosis of unspecified deep veins of left lower extremity: Secondary | ICD-10-CM

## 2021-10-28 DIAGNOSIS — Z930 Tracheostomy status: Secondary | ICD-10-CM

## 2021-10-28 DIAGNOSIS — J9621 Acute and chronic respiratory failure with hypoxia: Secondary | ICD-10-CM

## 2021-10-29 DIAGNOSIS — J9621 Acute and chronic respiratory failure with hypoxia: Secondary | ICD-10-CM

## 2021-10-29 DIAGNOSIS — Z930 Tracheostomy status: Secondary | ICD-10-CM

## 2021-10-29 DIAGNOSIS — J189 Pneumonia, unspecified organism: Secondary | ICD-10-CM

## 2021-10-29 DIAGNOSIS — I82402 Acute embolism and thrombosis of unspecified deep veins of left lower extremity: Secondary | ICD-10-CM

## 2021-11-21 DIAGNOSIS — K9423 Gastrostomy malfunction: Secondary | ICD-10-CM | POA: Diagnosis not present

## 2021-11-21 DIAGNOSIS — Z7401 Bed confinement status: Secondary | ICD-10-CM | POA: Diagnosis not present

## 2021-11-21 DIAGNOSIS — R1311 Dysphagia, oral phase: Secondary | ICD-10-CM | POA: Diagnosis not present

## 2021-11-21 DIAGNOSIS — G936 Cerebral edema: Secondary | ICD-10-CM | POA: Diagnosis not present

## 2021-11-21 DIAGNOSIS — J9621 Acute and chronic respiratory failure with hypoxia: Secondary | ICD-10-CM | POA: Diagnosis not present

## 2021-11-21 DIAGNOSIS — E43 Unspecified severe protein-calorie malnutrition: Secondary | ICD-10-CM | POA: Diagnosis not present

## 2021-11-21 DIAGNOSIS — F411 Generalized anxiety disorder: Secondary | ICD-10-CM | POA: Diagnosis not present

## 2021-11-21 DIAGNOSIS — G4700 Insomnia, unspecified: Secondary | ICD-10-CM | POA: Diagnosis not present

## 2021-11-21 DIAGNOSIS — I672 Cerebral atherosclerosis: Secondary | ICD-10-CM | POA: Diagnosis not present

## 2021-11-21 DIAGNOSIS — I63311 Cerebral infarction due to thrombosis of right middle cerebral artery: Secondary | ICD-10-CM | POA: Diagnosis not present

## 2021-11-21 DIAGNOSIS — M6281 Muscle weakness (generalized): Secondary | ICD-10-CM | POA: Diagnosis not present

## 2021-11-21 DIAGNOSIS — I69391 Dysphagia following cerebral infarction: Secondary | ICD-10-CM | POA: Diagnosis not present

## 2021-11-21 DIAGNOSIS — R031 Nonspecific low blood-pressure reading: Secondary | ICD-10-CM | POA: Diagnosis not present

## 2021-11-21 DIAGNOSIS — I69891 Dysphagia following other cerebrovascular disease: Secondary | ICD-10-CM | POA: Diagnosis not present

## 2021-11-21 DIAGNOSIS — I6789 Other cerebrovascular disease: Secondary | ICD-10-CM | POA: Diagnosis not present

## 2021-11-21 DIAGNOSIS — M2550 Pain in unspecified joint: Secondary | ICD-10-CM | POA: Diagnosis not present

## 2021-11-21 DIAGNOSIS — I63412 Cerebral infarction due to embolism of left middle cerebral artery: Secondary | ICD-10-CM | POA: Diagnosis not present

## 2021-11-21 DIAGNOSIS — F419 Anxiety disorder, unspecified: Secondary | ICD-10-CM | POA: Diagnosis not present

## 2021-11-21 DIAGNOSIS — Z09 Encounter for follow-up examination after completed treatment for conditions other than malignant neoplasm: Secondary | ICD-10-CM | POA: Diagnosis not present

## 2021-11-21 DIAGNOSIS — I69398 Other sequelae of cerebral infarction: Secondary | ICD-10-CM | POA: Diagnosis not present

## 2021-11-21 DIAGNOSIS — E118 Type 2 diabetes mellitus with unspecified complications: Secondary | ICD-10-CM | POA: Diagnosis not present

## 2021-11-21 DIAGNOSIS — R1310 Dysphagia, unspecified: Secondary | ICD-10-CM | POA: Diagnosis not present

## 2021-11-21 DIAGNOSIS — R066 Hiccough: Secondary | ICD-10-CM | POA: Diagnosis not present

## 2021-11-21 DIAGNOSIS — I82451 Acute embolism and thrombosis of right peroneal vein: Secondary | ICD-10-CM | POA: Diagnosis not present

## 2021-11-21 DIAGNOSIS — I63511 Cerebral infarction due to unspecified occlusion or stenosis of right middle cerebral artery: Secondary | ICD-10-CM | POA: Diagnosis not present

## 2021-11-21 DIAGNOSIS — I69319 Unspecified symptoms and signs involving cognitive functions following cerebral infarction: Secondary | ICD-10-CM | POA: Diagnosis not present

## 2021-11-21 DIAGNOSIS — F5105 Insomnia due to other mental disorder: Secondary | ICD-10-CM | POA: Diagnosis not present

## 2021-11-21 DIAGNOSIS — I69822 Dysarthria following other cerebrovascular disease: Secondary | ICD-10-CM | POA: Diagnosis not present

## 2021-11-21 DIAGNOSIS — R5381 Other malaise: Secondary | ICD-10-CM | POA: Diagnosis not present

## 2021-11-21 DIAGNOSIS — K9422 Gastrostomy infection: Secondary | ICD-10-CM | POA: Diagnosis not present

## 2021-11-21 DIAGNOSIS — F339 Major depressive disorder, recurrent, unspecified: Secondary | ICD-10-CM | POA: Diagnosis not present

## 2021-11-21 DIAGNOSIS — R2689 Other abnormalities of gait and mobility: Secondary | ICD-10-CM | POA: Diagnosis not present

## 2021-11-21 DIAGNOSIS — T8189XA Other complications of procedures, not elsewhere classified, initial encounter: Secondary | ICD-10-CM | POA: Diagnosis not present

## 2021-11-21 DIAGNOSIS — Z8669 Personal history of other diseases of the nervous system and sense organs: Secondary | ICD-10-CM | POA: Diagnosis not present

## 2021-11-21 DIAGNOSIS — G8194 Hemiplegia, unspecified affecting left nondominant side: Secondary | ICD-10-CM | POA: Diagnosis not present

## 2021-11-21 DIAGNOSIS — R42 Dizziness and giddiness: Secondary | ICD-10-CM | POA: Diagnosis not present

## 2021-11-21 DIAGNOSIS — I10 Essential (primary) hypertension: Secondary | ICD-10-CM | POA: Diagnosis not present

## 2021-11-21 DIAGNOSIS — E7800 Pure hypercholesterolemia, unspecified: Secondary | ICD-10-CM | POA: Diagnosis not present

## 2021-11-21 DIAGNOSIS — M25562 Pain in left knee: Secondary | ICD-10-CM | POA: Diagnosis not present

## 2021-11-21 DIAGNOSIS — F331 Major depressive disorder, recurrent, moderate: Secondary | ICD-10-CM | POA: Diagnosis not present

## 2021-11-21 DIAGNOSIS — Z9889 Other specified postprocedural states: Secondary | ICD-10-CM | POA: Diagnosis not present

## 2021-11-21 DIAGNOSIS — Z978 Presence of other specified devices: Secondary | ICD-10-CM | POA: Diagnosis not present

## 2021-11-21 DIAGNOSIS — I69354 Hemiplegia and hemiparesis following cerebral infarction affecting left non-dominant side: Secondary | ICD-10-CM | POA: Diagnosis not present

## 2021-11-24 DIAGNOSIS — I10 Essential (primary) hypertension: Secondary | ICD-10-CM | POA: Diagnosis not present

## 2021-11-24 DIAGNOSIS — J9621 Acute and chronic respiratory failure with hypoxia: Secondary | ICD-10-CM | POA: Diagnosis not present

## 2021-11-24 DIAGNOSIS — I82451 Acute embolism and thrombosis of right peroneal vein: Secondary | ICD-10-CM | POA: Diagnosis not present

## 2021-11-24 DIAGNOSIS — I69391 Dysphagia following cerebral infarction: Secondary | ICD-10-CM | POA: Diagnosis not present

## 2021-11-26 DIAGNOSIS — I10 Essential (primary) hypertension: Secondary | ICD-10-CM | POA: Diagnosis not present

## 2021-11-27 DIAGNOSIS — E7800 Pure hypercholesterolemia, unspecified: Secondary | ICD-10-CM | POA: Diagnosis not present

## 2021-11-27 DIAGNOSIS — I10 Essential (primary) hypertension: Secondary | ICD-10-CM | POA: Diagnosis not present

## 2021-11-27 DIAGNOSIS — E118 Type 2 diabetes mellitus with unspecified complications: Secondary | ICD-10-CM | POA: Diagnosis not present

## 2021-11-27 DIAGNOSIS — I63311 Cerebral infarction due to thrombosis of right middle cerebral artery: Secondary | ICD-10-CM | POA: Diagnosis not present

## 2021-11-28 DIAGNOSIS — I10 Essential (primary) hypertension: Secondary | ICD-10-CM | POA: Diagnosis not present

## 2021-11-28 DIAGNOSIS — J9621 Acute and chronic respiratory failure with hypoxia: Secondary | ICD-10-CM | POA: Diagnosis not present

## 2021-11-28 DIAGNOSIS — I82451 Acute embolism and thrombosis of right peroneal vein: Secondary | ICD-10-CM | POA: Diagnosis not present

## 2021-11-28 DIAGNOSIS — I69391 Dysphagia following cerebral infarction: Secondary | ICD-10-CM | POA: Diagnosis not present

## 2021-11-29 DIAGNOSIS — R5381 Other malaise: Secondary | ICD-10-CM | POA: Diagnosis not present

## 2021-11-29 DIAGNOSIS — M6281 Muscle weakness (generalized): Secondary | ICD-10-CM | POA: Diagnosis not present

## 2021-11-29 DIAGNOSIS — I69398 Other sequelae of cerebral infarction: Secondary | ICD-10-CM | POA: Diagnosis not present

## 2021-11-29 DIAGNOSIS — R2689 Other abnormalities of gait and mobility: Secondary | ICD-10-CM | POA: Diagnosis not present

## 2021-12-02 DIAGNOSIS — I69354 Hemiplegia and hemiparesis following cerebral infarction affecting left non-dominant side: Secondary | ICD-10-CM | POA: Diagnosis not present

## 2021-12-02 DIAGNOSIS — M25562 Pain in left knee: Secondary | ICD-10-CM | POA: Diagnosis not present

## 2021-12-03 DIAGNOSIS — R2689 Other abnormalities of gait and mobility: Secondary | ICD-10-CM | POA: Diagnosis not present

## 2021-12-03 DIAGNOSIS — I69398 Other sequelae of cerebral infarction: Secondary | ICD-10-CM | POA: Diagnosis not present

## 2021-12-03 DIAGNOSIS — R5381 Other malaise: Secondary | ICD-10-CM | POA: Diagnosis not present

## 2021-12-03 DIAGNOSIS — M6281 Muscle weakness (generalized): Secondary | ICD-10-CM | POA: Diagnosis not present

## 2021-12-08 ENCOUNTER — Encounter: Payer: Self-pay | Admitting: Adult Health

## 2021-12-08 ENCOUNTER — Ambulatory Visit: Payer: BC Managed Care – PPO | Admitting: Adult Health

## 2021-12-08 VITALS — BP 97/67 | HR 57

## 2021-12-08 DIAGNOSIS — Z8669 Personal history of other diseases of the nervous system and sense organs: Secondary | ICD-10-CM

## 2021-12-08 DIAGNOSIS — G8194 Hemiplegia, unspecified affecting left nondominant side: Secondary | ICD-10-CM

## 2021-12-08 DIAGNOSIS — Z09 Encounter for follow-up examination after completed treatment for conditions other than malignant neoplasm: Secondary | ICD-10-CM | POA: Diagnosis not present

## 2021-12-08 DIAGNOSIS — I63511 Cerebral infarction due to unspecified occlusion or stenosis of right middle cerebral artery: Secondary | ICD-10-CM | POA: Diagnosis not present

## 2021-12-08 NOTE — Progress Notes (Signed)
[facility] [address] [place]. [place] [address] [phone]       HOSPITAL FOLLOW UP NOTE  [name] Date of Birth:  1975-06-11 Medical Record Number:  [id number]   Reason for Referral:  hospital stroke follow up    SUBJECTIVE:   CHIEF COMPLAINT:  Chief Complaint  Patient presents with   Follow-up    His wife states he hasn't had any strokes since the last one. Room 2 with wife    HPI:   Jacob [name] is a 46 y.o. male with a history of htn and DM who presents with left sided facial droop and left arm weakness.  Initially, significant improvement of symptoms upon route to ED with initial evaluation symptoms almost completely resolved therefore TNK not administered. CTH no acute abnormality, MRI showed tiny acute infarct right MCA.  But then noted fluctuation mild to severe dysarthria, right gaze preference, left hemiplegia and left sided sensory deficit. CTA showed R M1 occlusion. Due to fluctuation of symptoms which were disabling at times, TNK administered after stabilization of blood pressure.  Taken for mechanical thrombectomy with TICI 3 revascularization. MRI showed right MCA territory infarct.  Repeat MRA 4/22 showed reoccluded right M2 branch and left M1 stenosis.  TEE consistent with intrapulmonary shunt.  Repeat CT head 4/24 as difficult to arouse which showed increase edema with new 12 mm midline shift and underwent decompressive hemicraniectomy with placement of bone flap and abdomen.  Repeat CT head stable.  Left intubated postprocedure and eventual placement of tracheostomy 5/1.  Developed DVT in right peroneal vein and placed on Eliquis.  Placed on atorvastatin 40 mg daily for LDL 96.  A1c 8.1 with diabetic regimen adjusted advised outpatient follow-up with PCP for further management.  Hospital course prolonged by dysphagia requiring TF and NPO status, respiratory failure, fever with leukocytosis with tracheal aspirate +MRSA and headaches.   He was eventually discharged to [facility] on 5/17.   Today, 12/08/2021, patient is being seen for initial hospital follow-up accompanied by his wife who provides majority of history.  Currently residing at [facility] care center.  Has been doing well without new stroke/TIA symptoms.  Continues working with PT/OT/SLP. Reports improvement of dysphagia, currently on modified diet without difficulty. Had trach tube removed beginning of June.  Continued right gaze preference but wife believes this has been improving. Continued dense left hemiplegia with sensory impairment. Denies headaches - on topamax 50mg  twice daily.   Did had f/u with neurosurgery approx 3 weeks ago  - currently awaiting for swelling to go down in order to have bone flap replaced.   Per SNF MAR review, compliant on Eliquis and atorvastatin, denies side effects Blood pressure today 97/67 -monitored at facility and per wife, typically 120-130s.  Glucose levels monitored at facility which have been stable per wife on current insulin regimen  No further concerns at this time.      PERTINENT IMAGING  Per hospitalization 4/21-5/17/2023 Code Stroke CT head No acute abnormality CTA head & neck R short segment M1 occlusion, left distal M1 high grade stenosis Post IR CT No hemorrhagic complication on flat panel CT. 4/21- Early MRI Tiny acute infarcts in the upper division right MCA cortex. 4/22- MRI right MCA territory infarct involving the cortex, sparing the basal ganglia.  2 mm midline shift.  No hemorrhage 4/22- MRA reoccluded right M2 branch, intracranial atherosclerosis including advanced left M1 stenosis 4/24-  Head CT-increased edema resulting in near complete effacement of  the right lateral ventricle and 12 mm leftward midline shift 4/25- Head CT Post OP- 5 mm left midline shift, new area of hypoattenuation of the left middle cerebellar peduncle and cerebellum 4/28 head CT large right MCA infarct with no HT,  diminished mass effect with no midline shift, hemicraniectomy noted 5/3 CT head stable, no midline shift, large right MCA infarct 2D Echo EF > 75% TEE- Consistent with intrapulmonary shunt LDL 96 HgbA1c 8.1   ROS:   14 system review of systems performed and negative with exception of those listed in HPI  PMH:  Past Medical History:  Diagnosis Date   Allergy    Diabetes mellitus without complication ([place])    Hypertension     PSH:  Past Surgical History:  Procedure Laterality Date   APPENDECTOMY     BUBBLE STUDY  10/02/2021   Procedure: BUBBLE STUDY;  Surgeon: Fay Records, MD;  Location: [facility];  Service: Cardiovascular;;   CRANIOTOMY Right 09/15/2021   Procedure: RIGHT DECOMPRESSIVE CRANIOTOMY, PLACEMENT OF BONE FLAP IN ABDOMEN;  Surgeon: Newman Pies, MD;  Location: [facility];  Service: Neurosurgery;  Laterality: Right;   IR CT HEAD LTD  09/12/2021   IR PERCUTANEOUS ART THROMBECTOMY/INFUSION INTRACRANIAL INC DIAG ANGIO  09/12/2021   IR US GUIDE VASC ACCESS RIGHT  09/12/2021   LAPAROSCOPIC APPENDECTOMY  10/24/2013   Procedure: APPENDECTOMY LAPAROSCOPIC;  Surgeon: Shann Medal, MD;  Location: [facility];  Service: General;;   RADIOLOGY WITH ANESTHESIA N/A 09/12/2021   Procedure: IR WITH ANESTHESIA;  Surgeon: Luanne Bras, MD;  Location: [facility];  Service: Radiology;  Laterality: N/A;   TEE WITHOUT CARDIOVERSION N/A 10/02/2021   Procedure: TRANSESOPHAGEAL ECHOCARDIOGRAM (TEE);  Surgeon: Fay Records, MD;  Location: [facility];  Service: Cardiovascular;  Laterality: N/A;    Social History:  Social History   Socioeconomic History   Marital status: Married    Spouse name: Not on file   Number of children: Not on file   Years of education: Not on file   Highest education level: Not on file  Occupational History   Not on file  Tobacco Use   Smoking status: Never   Smokeless tobacco: Never  Substance and Sexual Activity   Alcohol use: Yes   Drug use: No   Sexual  activity: Not on file  Other Topics Concern   Not on file  Social History Narrative   Not on file   Social Determinants of Health   Financial Resource Strain: Not on file  Food Insecurity: Not on file  Transportation Needs: Not on file  Physical Activity: Not on file  Stress: Not on file  Social Connections: Not on file  Intimate Partner Violence: Not on file    Family History:  Family History  Problem Relation Age of Onset   Asthma Son     Medications:   Current Outpatient Medications on File Prior to Visit  Medication Sig Dispense Refill   acetaminophen (TYLENOL) 160 MG/5ML solution Place 20.3 mLs (650 mg total) into feeding tube every 4 (four) hours as needed for mild pain (or temp > 37.5 C (99.5 F)). 120 mL 0   amLODipine (NORVASC) 10 MG tablet Place 1 tablet (10 mg total) into feeding tube daily. 30 tablet 1   apixaban (ELIQUIS) 5 MG TABS tablet Take 1 tablet (5 mg total) by mouth 2 (two) times daily. 60 tablet 1   atorvastatin (LIPITOR) 40 MG tablet Place 1 tablet (40 mg total) into feeding tube daily.  30 tablet 1   cloNIDine (CATAPRES - DOSED IN MG/24 HR) 0.3 mg/24hr patch Place 1 patch (0.3 mg total) onto the skin once a week. 4 patch 12   escitalopram (LEXAPRO) 20 MG tablet Place 1 tablet (20 mg total) into feeding tube daily. 30 tablet 1   insulin aspart (NOVOLOG) 100 UNIT/ML injection Inject 8 Units into the skin 4 (four) times daily. 10 mL 11   insulin aspart (NOVOLOG) 100 UNIT/ML injection Inject 0-15 Units into the skin every 4 (four) hours. 10 mL 11   lisinopril (ZESTRIL) 40 MG tablet Place 1 tablet (40 mg total) into feeding tube daily. 30 tablet 1   ondansetron (ZOFRAN) 4 MG/2ML SOLN injection Inject 2 mLs (4 mg total) into the vein every 4 (four) hours as needed for nausea or vomiting. 2 mL 0   topiramate (TOPAMAX) 50 MG tablet Place 1 tablet (50 mg total) into feeding tube 2 (two) times daily. 60 tablet 1   albuterol (PROVENTIL) (2.5 MG/3ML) 0.083% nebulizer  solution Take 3 mLs (2.5 mg total) by nebulization every 6 (six) hours as needed for wheezing or shortness of breath. (Patient not taking: Reported on 12/08/2021) 75 mL 12   bisacodyl (DULCOLAX) 10 MG suppository Place 1 suppository (10 mg total) rectally daily at 2 PM. (Patient not taking: Reported on 12/08/2021) 12 suppository 0   carvedilol (COREG) 25 MG tablet Place 1 tablet (25 mg total) into feeding tube 2 (two) times daily with a meal. (Patient not taking: Reported on 12/08/2021) 30 tablet 1   chlorproMAZINE (THORAZINE) 10 MG tablet Place 1 tablet (10 mg total) into feeding tube 3 (three) times daily as needed for hiccoughs. (Patient not taking: Reported on 12/08/2021) 30 tablet 1   insulin detemir (LEVEMIR) 100 UNIT/ML injection Inject 0.5 mLs (50 Units total) into the skin 2 (two) times daily. (Patient not taking: Reported on 12/08/2021) 10 mL 11   linezolid (ZYVOX) 600 MG tablet Place 1 tablet (600 mg total) into feeding tube every 12 (twelve) hours. (Patient not taking: Reported on 12/08/2021) 7 tablet 0   pantoprazole sodium (PROTONIX) 40 mg Place 40 mg into feeding tube daily. (Patient not taking: Reported on 12/08/2021) 30 packet 1   polyethylene glycol (MIRALAX / GLYCOLAX) 17 g packet Place 17 g into feeding tube daily as needed for mild constipation, moderate constipation or severe constipation. (Patient not taking: Reported on 12/08/2021) 14 each 0   No current facility-administered medications on file prior to visit.    Allergies:   Allergies  Allergen Reactions   Hydrochlorothiazide Other (See Comments)    Bilateral muscle cramping      OBJECTIVE:  Physical Exam  Vitals:   12/08/21 1007  BP: 97/67  Pulse: (!) 57   There is no height or weight on file to calculate BMI. No results found.  General: well developed, well nourished, very pleasant middle-aged African-American male, seated, in no evident distress with soft helmet in place Head: head normocephalic and atraumatic.    Neck: supple with no carotid or supraclavicular bruits Cardiovascular: regular rate and rhythm, no murmurs Musculoskeletal: no deformity Skin:  no rash/petichiae; palpation of bone flap RLQ abdomen Vascular:  Normal pulses all extremities   Neurologic Exam Mental Status: Awake and fully alert.  Hypophonia with dysarthria.  No evidence of aphasia.  Oriented to place and time. Recent and remote memory appear intact. Attention span, concentration and fund of knowledge seem appropriate although wife provides majority of history. Mood and affect appropriate.  Cranial  Nerves: Fundoscopic exam reveals sharp disc margins. Pupils equal, briskly reactive to light. Right gaze preference although able to pass midline with mild difficulty.  Visual fields no blink to threat on left. Hearing intact. Facial sensation intact.  Left lower facial weakness.  Tongue, palate moves normally and symmetrically.  Motor: Normal strength, bulk and tone right upper and lower extremity.  Dense left hemiplegia Sensory.:  Limited to no left-sided sensory.  Intact right side Coordination: Rapid alternating movements normal on right side. Finger-to-nose and heel-to-shin performed accurately on right side. Gait and Station: Deferred Reflexes: 1+ and symmetric. Toes downgoing.     NIHSS  15 Modified Rankin  4      ASSESSMENT: Jacob [name] is a 46 y.o. year old male with right MCA infarct on 09/12/2021 in setting of right MCA occlusion s/p TNK and IR with TICI 3 with unclear etiology, embolic vs intracranial arthrosclerosis.  Prolonged hospital course with cerebral edema s/p [facility], right peroneal DVT placed on Eliquis, dysphagia, respiratory failure s/p tracheostomy 5/1 with removal 10/2021, fever and leukocytosis with tracheal aspirate + MRSA, poststroke depression and headaches.  Vascular risk factors include HTN, HLD, DM and obesity.      PLAN:  Right MCA stroke:  Residual deficit: dense left hemiplegia with sensory  loss/impairment, left hemianopia, right gaze preference, dysphagia and dysarthria. Continue working with PT/OT/SLP at facility for hopeful further recovery.  Continue  Eliquis 5 mg twice daily   and atorvastatin 40 mg daily for secondary stroke prevention and acute DVT.   Discussed secondary stroke prevention measures and importance of close PCP follow up for aggressive stroke risk factor management including BP goal<130/90, HLD with LDL goal<70 and DM with A1c.<7   I have gone over the pathophysiology of stroke, warning signs and symptoms, risk factors and their management in some detail with instructions to go to the closest emergency room for symptoms of concern. Cerebral edema s/p [facility]: Followed by neurosurgery. Currently awaiting for swelling to subside to proceed with replacement of bone flap. Continued use of helmet as advised.  Acute RLE DVT: continue eliquis 5mg  BID managed by PCP/facility. Once completed, recommend aspirin 81 mg daily for secondary stroke prevention measures    Follow up in 3 months with Dr. to further discuss if further work up indicated for stroke etiology although not indicated at this time as currently on [facility] for acute DVT tx. advised to call sooner if needed.   CC:  [facility] provider: [facility] PCP: [facility]    I spent 59 minutes of face-to-face and non-face-to-face time with patient and wife.  This included previsit chart review including review of prolonged recent hospitalization, lab review, study review, electronic health record documentation, patient and wife education regarding recent stroke including etiology, secondary stroke prevention measures and importance of managing stroke risk factors, residual deficits and typical recovery time and answered all other questions to patient and wife's satisfaction   [facility], AGNP-BC  [facility] [address] [place], [place]  Phone [phone] Fax  [phone] Note: This document was prepared with digital dictation and possible smart phrase technology. Any transcriptional errors that result from this process are unintentional.

## 2021-12-08 NOTE — Patient Instructions (Signed)
Continue working with therapies for hopeful further recovery  Continue Eliquis (apixaban) daily  and atorvastatin  for secondary stroke prevention and acute DVT treatment Ongoing duration and DVT f/u will be completed by PCP/facility   Continue to follow up with PCP regarding cholesterol, blood pressure and diabetes management  Maintain strict control of hypertension with blood pressure goal below 130/90, diabetes with hemoglobin A1c goal below 7.0 % and cholesterol with LDL cholesterol (bad cholesterol) goal below 70 mg/dL.   Signs of a Stroke? Follow the BEFAST method:  Balance Watch for a sudden loss of balance, trouble with coordination or vertigo Eyes Is there a sudden loss of vision in one or both eyes? Or double vision?  Face: Ask the person to smile. Does one side of the face droop or is it numb?  Arms: Ask the person to raise both arms. Does one arm drift downward? Is there weakness or numbness of a leg? Speech: Ask the person to repeat a simple phrase. Does the speech sound slurred/strange? Is the person confused ? Time: If you observe any of these signs, call 911.    Followup in the future with me in 3 months with Dr. Pearlean Brownie or call earlier if needed       Thank you for coming to see [place] at [facility]. I hope we have been able to provide you high quality care today.  You may receive a patient satisfaction survey over the next few weeks. We would appreciate your feedback and comments so that we may continue to improve ourselves and the health of our patients.

## 2021-12-11 DIAGNOSIS — I69354 Hemiplegia and hemiparesis following cerebral infarction affecting left non-dominant side: Secondary | ICD-10-CM | POA: Diagnosis not present

## 2021-12-11 DIAGNOSIS — I69391 Dysphagia following cerebral infarction: Secondary | ICD-10-CM | POA: Diagnosis not present

## 2021-12-11 DIAGNOSIS — E43 Unspecified severe protein-calorie malnutrition: Secondary | ICD-10-CM | POA: Diagnosis not present

## 2021-12-16 DIAGNOSIS — E118 Type 2 diabetes mellitus with unspecified complications: Secondary | ICD-10-CM | POA: Diagnosis not present

## 2021-12-16 DIAGNOSIS — E7800 Pure hypercholesterolemia, unspecified: Secondary | ICD-10-CM | POA: Diagnosis not present

## 2021-12-16 DIAGNOSIS — I10 Essential (primary) hypertension: Secondary | ICD-10-CM | POA: Diagnosis not present

## 2021-12-16 DIAGNOSIS — I63311 Cerebral infarction due to thrombosis of right middle cerebral artery: Secondary | ICD-10-CM | POA: Diagnosis not present

## 2021-12-17 DIAGNOSIS — R5381 Other malaise: Secondary | ICD-10-CM | POA: Diagnosis not present

## 2021-12-17 DIAGNOSIS — I69398 Other sequelae of cerebral infarction: Secondary | ICD-10-CM | POA: Diagnosis not present

## 2021-12-17 DIAGNOSIS — M6281 Muscle weakness (generalized): Secondary | ICD-10-CM | POA: Diagnosis not present

## 2021-12-17 DIAGNOSIS — R2689 Other abnormalities of gait and mobility: Secondary | ICD-10-CM | POA: Diagnosis not present

## 2021-12-18 DIAGNOSIS — I69391 Dysphagia following cerebral infarction: Secondary | ICD-10-CM | POA: Diagnosis not present

## 2021-12-18 DIAGNOSIS — G4700 Insomnia, unspecified: Secondary | ICD-10-CM | POA: Diagnosis not present

## 2021-12-18 DIAGNOSIS — E43 Unspecified severe protein-calorie malnutrition: Secondary | ICD-10-CM | POA: Diagnosis not present

## 2021-12-19 DIAGNOSIS — I10 Essential (primary) hypertension: Secondary | ICD-10-CM | POA: Diagnosis not present

## 2021-12-23 DIAGNOSIS — I10 Essential (primary) hypertension: Secondary | ICD-10-CM | POA: Diagnosis not present

## 2021-12-24 DIAGNOSIS — E43 Unspecified severe protein-calorie malnutrition: Secondary | ICD-10-CM | POA: Diagnosis not present

## 2021-12-24 DIAGNOSIS — I69391 Dysphagia following cerebral infarction: Secondary | ICD-10-CM | POA: Diagnosis not present

## 2021-12-24 DIAGNOSIS — I69354 Hemiplegia and hemiparesis following cerebral infarction affecting left non-dominant side: Secondary | ICD-10-CM | POA: Diagnosis not present

## 2021-12-25 ENCOUNTER — Other Ambulatory Visit: Payer: Self-pay | Admitting: Neurosurgery

## 2021-12-25 DIAGNOSIS — I10 Essential (primary) hypertension: Secondary | ICD-10-CM | POA: Diagnosis not present

## 2021-12-25 DIAGNOSIS — E118 Type 2 diabetes mellitus with unspecified complications: Secondary | ICD-10-CM | POA: Diagnosis not present

## 2021-12-25 DIAGNOSIS — I63311 Cerebral infarction due to thrombosis of right middle cerebral artery: Secondary | ICD-10-CM | POA: Diagnosis not present

## 2021-12-25 DIAGNOSIS — E7800 Pure hypercholesterolemia, unspecified: Secondary | ICD-10-CM | POA: Diagnosis not present

## 2021-12-26 DIAGNOSIS — I10 Essential (primary) hypertension: Secondary | ICD-10-CM | POA: Diagnosis not present

## 2021-12-26 DIAGNOSIS — R42 Dizziness and giddiness: Secondary | ICD-10-CM | POA: Diagnosis not present

## 2021-12-26 DIAGNOSIS — R031 Nonspecific low blood-pressure reading: Secondary | ICD-10-CM | POA: Diagnosis not present

## 2021-12-30 DIAGNOSIS — I10 Essential (primary) hypertension: Secondary | ICD-10-CM | POA: Diagnosis not present

## 2021-12-30 DIAGNOSIS — I63311 Cerebral infarction due to thrombosis of right middle cerebral artery: Secondary | ICD-10-CM | POA: Diagnosis not present

## 2021-12-30 DIAGNOSIS — E118 Type 2 diabetes mellitus with unspecified complications: Secondary | ICD-10-CM | POA: Diagnosis not present

## 2021-12-30 DIAGNOSIS — E7800 Pure hypercholesterolemia, unspecified: Secondary | ICD-10-CM | POA: Diagnosis not present

## 2022-01-01 ENCOUNTER — Inpatient Hospital Stay (HOSPITAL_COMMUNITY): Admission: RE | Admit: 2022-01-01 | Payer: BC Managed Care – PPO | Source: Ambulatory Visit

## 2022-01-05 NOTE — Pre-Procedure Instructions (Signed)
    Jacob [name]  01/05/2022      Procedure is scheduled on Wednesday, August 16.  Report to [facility], Main Entrance or Entrance "A" at 1020.                Your surgery or procedure is scheduled to begin at   Call this number if you have problems the morning of surgery: [phone]  This is the number for the Pre- Surgical Desk.              >>>>>Please send patient's Medication Record with medications administrated documentation. ( this information is required prior to OR. This includes medications that may have been on hold for surgery)<<<<<  Patient should be given 25 units of Lantus tonight.  Remember:  Stop tube feeding at midnight, flush with the smallest amount of water that you can: Amlodipine atorvastatin (LIPITOR) escitalopram (LEXAPRO) famotidine (PEPCID) metoprolol tartrate (LOPRESSOR) topiramate (TOPAMAX)   If CBG is greater than 70 give 25 units of Lantus Insulin. Check patient's blood sugar the morning of your surgery when  you wake up and every 2 hours until he leaves to come to the hospital. If your blood sugar is less than 70 mg/dL, you will need to treat for low blood sugar: Do not take insulin. Treat a low blood sugar (less than 70 mg/dL) with  cup of clear juice (cranberry or apple), 4 glucose tablets, OR glucose gel. Recheck blood sugar in 15 minutes after treatment (to make sure it is greater than 70 mg/dL). If your blood sugar is not greater than 70 mg/dL on recheck, call [phone] for further instructions. Report your blood sugar to the short stay nurse when you get to Short Stay If your CBG is greater than 220 mg/dL, you may take  of your sliding scale (correction) dose of insulin. STOP taking, unless rhe surgeon instructed other wise: Aspirin Products (Goody Powder, Excedrin Migraine), Ibuprofen (Advil), Naproxen (Aleve), Vitamins and Herbal Products (ie Fish Oil).   Patient should have a shower, with antibacteria soap, the morning of  surgery . Dry off with a clean towel.  Patient should not have lotions, powders, colognes, deodorant, jewelry, or piercing's. Wear clean comfortable clothes.  Brush teeth. Jacob [name] may be shaved. Jacob [name] should not bring any valuables

## 2022-01-06 ENCOUNTER — Other Ambulatory Visit: Payer: Self-pay | Admitting: Neurosurgery

## 2022-01-06 DIAGNOSIS — Z9889 Other specified postprocedural states: Secondary | ICD-10-CM | POA: Diagnosis not present

## 2022-01-06 DIAGNOSIS — I69354 Hemiplegia and hemiparesis following cerebral infarction affecting left non-dominant side: Secondary | ICD-10-CM | POA: Diagnosis not present

## 2022-01-06 DIAGNOSIS — I10 Essential (primary) hypertension: Secondary | ICD-10-CM | POA: Diagnosis not present

## 2022-01-06 DIAGNOSIS — I69319 Unspecified symptoms and signs involving cognitive functions following cerebral infarction: Secondary | ICD-10-CM | POA: Diagnosis not present

## 2022-01-06 NOTE — Progress Notes (Addendum)
I spoke with [name] nurse at [facility], the nurse reported that Eliquis has not be on hold. I called Dr. Lovell Sheehan office, left a voice message for [name], letting her know that patient is still taking Eliquis. I asked [name] to call me back when she finds out is patient needs to be scheduled.There is an order in surgical orders to stop Eliquis 2 days prior to surgery.

## 2022-01-09 DIAGNOSIS — R031 Nonspecific low blood-pressure reading: Secondary | ICD-10-CM | POA: Diagnosis not present

## 2022-01-09 DIAGNOSIS — I10 Essential (primary) hypertension: Secondary | ICD-10-CM | POA: Diagnosis not present

## 2022-01-12 DIAGNOSIS — F5105 Insomnia due to other mental disorder: Secondary | ICD-10-CM | POA: Diagnosis not present

## 2022-01-12 DIAGNOSIS — F419 Anxiety disorder, unspecified: Secondary | ICD-10-CM | POA: Diagnosis not present

## 2022-01-12 DIAGNOSIS — F331 Major depressive disorder, recurrent, moderate: Secondary | ICD-10-CM | POA: Diagnosis not present

## 2022-01-13 DIAGNOSIS — I63311 Cerebral infarction due to thrombosis of right middle cerebral artery: Secondary | ICD-10-CM | POA: Diagnosis not present

## 2022-01-13 DIAGNOSIS — I10 Essential (primary) hypertension: Secondary | ICD-10-CM | POA: Diagnosis not present

## 2022-01-13 DIAGNOSIS — E7800 Pure hypercholesterolemia, unspecified: Secondary | ICD-10-CM | POA: Diagnosis not present

## 2022-01-13 DIAGNOSIS — E118 Type 2 diabetes mellitus with unspecified complications: Secondary | ICD-10-CM | POA: Diagnosis not present

## 2022-01-16 ENCOUNTER — Other Ambulatory Visit: Payer: Self-pay

## 2022-01-16 NOTE — Patient Outreach (Signed)
[facility]) Care Management  01/16/2022  Jacob [name] Oct 07, 1975 [id number]   Telephone outreach to patient to obtain mRS was successfully completed. MRS= 5  [name] Sarah [facility] Care Management Assistant [phone]

## 2022-01-19 DIAGNOSIS — I69354 Hemiplegia and hemiparesis following cerebral infarction affecting left non-dominant side: Secondary | ICD-10-CM | POA: Insufficient documentation

## 2022-01-20 DIAGNOSIS — E7800 Pure hypercholesterolemia, unspecified: Secondary | ICD-10-CM | POA: Diagnosis not present

## 2022-01-20 DIAGNOSIS — I63311 Cerebral infarction due to thrombosis of right middle cerebral artery: Secondary | ICD-10-CM | POA: Diagnosis not present

## 2022-01-20 DIAGNOSIS — E118 Type 2 diabetes mellitus with unspecified complications: Secondary | ICD-10-CM | POA: Diagnosis not present

## 2022-01-20 DIAGNOSIS — I10 Essential (primary) hypertension: Secondary | ICD-10-CM | POA: Diagnosis not present

## 2022-01-21 DIAGNOSIS — F411 Generalized anxiety disorder: Secondary | ICD-10-CM | POA: Diagnosis not present

## 2022-01-21 DIAGNOSIS — G936 Cerebral edema: Secondary | ICD-10-CM | POA: Diagnosis not present

## 2022-01-21 DIAGNOSIS — J9621 Acute and chronic respiratory failure with hypoxia: Secondary | ICD-10-CM | POA: Diagnosis not present

## 2022-01-21 DIAGNOSIS — I672 Cerebral atherosclerosis: Secondary | ICD-10-CM | POA: Diagnosis not present

## 2022-01-21 DIAGNOSIS — K9422 Gastrostomy infection: Secondary | ICD-10-CM | POA: Diagnosis not present

## 2022-01-21 DIAGNOSIS — I10 Essential (primary) hypertension: Secondary | ICD-10-CM | POA: Diagnosis not present

## 2022-01-21 DIAGNOSIS — I69354 Hemiplegia and hemiparesis following cerebral infarction affecting left non-dominant side: Secondary | ICD-10-CM | POA: Diagnosis not present

## 2022-01-21 DIAGNOSIS — I69891 Dysphagia following other cerebrovascular disease: Secondary | ICD-10-CM | POA: Diagnosis not present

## 2022-01-22 DIAGNOSIS — R2689 Other abnormalities of gait and mobility: Secondary | ICD-10-CM | POA: Diagnosis not present

## 2022-01-22 DIAGNOSIS — I69398 Other sequelae of cerebral infarction: Secondary | ICD-10-CM | POA: Diagnosis not present

## 2022-01-22 DIAGNOSIS — R5381 Other malaise: Secondary | ICD-10-CM | POA: Diagnosis not present

## 2022-01-22 DIAGNOSIS — M6281 Muscle weakness (generalized): Secondary | ICD-10-CM | POA: Diagnosis not present

## 2022-01-26 DIAGNOSIS — I69311 Memory deficit following cerebral infarction: Secondary | ICD-10-CM | POA: Diagnosis not present

## 2022-01-26 DIAGNOSIS — F411 Generalized anxiety disorder: Secondary | ICD-10-CM | POA: Diagnosis not present

## 2022-01-26 DIAGNOSIS — E119 Type 2 diabetes mellitus without complications: Secondary | ICD-10-CM | POA: Diagnosis not present

## 2022-01-26 DIAGNOSIS — J9621 Acute and chronic respiratory failure with hypoxia: Secondary | ICD-10-CM | POA: Diagnosis not present

## 2022-01-26 DIAGNOSIS — I69322 Dysarthria following cerebral infarction: Secondary | ICD-10-CM | POA: Diagnosis not present

## 2022-01-26 DIAGNOSIS — Z794 Long term (current) use of insulin: Secondary | ICD-10-CM | POA: Diagnosis not present

## 2022-01-26 DIAGNOSIS — Z7901 Long term (current) use of anticoagulants: Secondary | ICD-10-CM | POA: Diagnosis not present

## 2022-01-26 DIAGNOSIS — I69391 Dysphagia following cerebral infarction: Secondary | ICD-10-CM | POA: Diagnosis not present

## 2022-01-26 DIAGNOSIS — I82451 Acute embolism and thrombosis of right peroneal vein: Secondary | ICD-10-CM | POA: Diagnosis not present

## 2022-01-26 DIAGNOSIS — I69354 Hemiplegia and hemiparesis following cerebral infarction affecting left non-dominant side: Secondary | ICD-10-CM | POA: Diagnosis not present

## 2022-01-26 DIAGNOSIS — I10 Essential (primary) hypertension: Secondary | ICD-10-CM | POA: Diagnosis not present

## 2022-01-30 NOTE — Pre-Procedure Instructions (Signed)
Jacob [name]  01/30/2022   Your procedure is scheduled on Monday, September 18..  Report to [facility] Admitting at 0530 A.M.    Surgery is scheduled to begin at 7:30 AM   Call this number if you have problems the morning of surgery [phone] this is the pre surgery desk. If you have questions Monday - Friday, call [phone]  ask to speak to a nurse.   Remember:  Do not eat or drink after midnight, Sunday, September 17.    Take these medicinesthe morning of surgery with A SIP OF WATER:  Follow your surgeon instructions regarding Lovenox.  1 Week prior to surgery STOP taking Aspirin, Aspirin Products (Goody Powder, Excedrin Migraine), Ibuprofen (Advil), Naproxen (Aleve), Vitamins and Herbal Products (ie Fish Oil).    WHAT DO I DO ABOUT MY DIABETES MEDICATION?  Do not take oral diabetes medicines (pills) the morning of surgery.  THE NIGHT BEFORE SURGERY, take ___________ units of ___________insulin.  Why is it important to control my blood sugar before and after surgery? Improving blood sugar levels before and after surgery helps healing and can limit problems. A way of improving blood sugar control is eating a healthy diet by:  Eating less sugar and carbohydrates  Increasing activity/exercise  Talking with your doctor about reaching your blood sugar goals High blood sugars (greater than 180 mg/dL) can raise your risk of infections and slow your recovery, so you will need to focus on controlling your diabetes during the weeks before surgery. Make sure that the doctor who takes care of your diabetes knows about your planned surgery including the date and location.  How do I manage my blood sugar before surgery? Check your blood sugar at least 4 times a day, starting 2 days before surgery, to make sure that the level is not too high or low. Check your blood sugar the morning of your surgery when you wake up and every 2 hours until you get to the Short Stay  unit. If your blood sugar is less than 70 mg/dL, you will need to treat for low blood sugar: Do not take insulin. Treat a low blood sugar (less than 70 mg/dL) with  cup of clear juice (cranberry or apple), 4 glucose tablets, OR glucose gel. Recheck blood sugar in 15 minutes after treatment (to make sure it is greater than 70 mg/dL). If your blood sugar is not greater than 70 mg/dL on recheck, call [phone]  for further instructions. Report your blood sugar to the short stay nurse when you get to Short Stay.  If you are admitted to the hospital after surgery: Your blood sugar will be checked by the staff and you will probably be given insulin after surgery (instead of oral diabetes medicines) to make sure you have good blood sugar levels. The goal for blood sugar control after surgery is 80-180 mg/dL      Special instructions:    [facility]- Preparing For Surgery  Before surgery, you can play an important role. Because skin is not sterile, your skin needs to be as free of germs as possible. You can reduce the number of germs on your skin by washing with CHG (chlorahexidine gluconate) Soap before surgery.  CHG is an antiseptic cleaner which kills germs and bonds with the skin to continue killing germs even after washing.    Oral Hygiene is also important to reduce your risk of infection.  Remember - BRUSH YOUR TEETH THE MORNING OF  SURGERY WITH YOUR REGULAR TOOTHPASTE  Please do not use if you have an allergy to CHG or antibacterial soaps. If your skin becomes reddened/irritated stop using the CHG.  Do not shave (including legs and underarms) for at least 48 hours prior to first CHG shower. It is OK to shave your face.  Please follow these instructions carefully.   Shower the NIGHT BEFORE SURGERY and the MORNING OF SURGERY with CHG.   If you chose to wash your hair, wash your hair first as usual with your normal shampoo.  After you shampoo, wash your face and private area with the  soap you use at home, then rinse your hair and body thoroughly to remove the shampoo and soap.  Use CHG as you would any other liquid soap. You can apply CHG directly to the skin and wash gently with a scrungie or a clean washcloth.   Apply the CHG Soap to your body ONLY FROM THE NECK DOWN.  Do not use on open wounds or open sores. Avoid contact with your eyes, ears, mouth and genitals (private parts).   Wash thoroughly, paying special attention to the area where your surgery will be performed.  Thoroughly rinse your body with warm water from the neck down.  DO NOT shower/wash with your normal soap after using and rinsing off the CHG Soap.  Pat yourself dry with a CLEAN TOWEL.  Wear CLEAN PAJAMAS to bed the night before surgery, wear comfortable clothes the morning of surgery  Place CLEAN SHEETS on your bed the night of your first shower and DO NOT SLEEP WITH PETS.  Day of Surgery: Shower as instructed above. Do not apply any deodorants/lotions, powders or colognes.  Please wear clean clothes to the hospital/surgery center.   Remember to brush your teeth WITH YOUR REGULAR TOOTHPASTE.  Do not wear jewelry, make-up or nail polish.  Do not shave 48 hours prior to surgery.  Men may shave face and neck.  Do not bring valuables to the hospital.  [facility] is not responsible for any belongings or valuables.  Contacts, dentures or bridgework may not be worn into surgery.  Leave your suitcase in the car.  After surgery it may be brought to your room.  For patients admitted to the hospital, discharge time will be determined by your treatment team.  Patients discharged the day of surgery will not be allowed to drive home.   Please read over the fact sheets that you were given.    2 family members or friends are allowed in the waiting room during surgery.  When patient is in a hospital room ,the unit will tell you how many people may visit at a time; this group will be able to  switch.

## 2022-01-30 NOTE — Pre-Procedure Instructions (Signed)
Jacob [name]  01/30/2022   Your procedure is scheduled on Monday, September 18..  Report to [facility] Admitting at 0530 A.M.    Surgery is scheduled to begin at 7:30 AM   Call this number if you have problems the morning of surgery :[phone] this is the pre surgery desk. If you have questions Monday - Friday, call [phone]  ask to speak to a nurse.   Remember:  Do not eat or drink after midnight, Sunday, September 17.    Take these medicinesthe morning of surgery with A SIP OF WATER: Amlodipine Liptor Famotidine Lexapro Metoprolol Topramate   If needed: Tylenol   STOP taking Aspirin, Aspirin Products (Goody Powder, Excedrin Migraine), Ibuprofen (Advil), Naproxen (Aleve), Vitamins and Herbal Products (ie Fish Oil).    WHAT DO I DO ABOUT MY DIABETES MEDICATION?  Do not take oral diabetes medicines (pills) the morning of surgery.  THE NIGHT BEFORE SURGERY, take ___8___ units of Lantus insulin.  On the morning of surgery if C BG is greater than 70, take 8 units of Lantus Insulin. Why is it important to control my blood sugar before and after surgery? Improving blood sugar levels before and after surgery helps healing and can limit problems. A way of improving blood sugar control is eating a healthy diet by:  Eating less sugar and carbohydrates  Increasing activity/exercise  Talking with your doctor about reaching your blood sugar goals High blood sugars (greater than 180 mg/dL) can raise your risk of infections and slow your recovery, so you will need to focus on controlling your diabetes during the weeks before surgery. Make sure that the doctor who takes care of your diabetes knows about your planned surgery including the date and location.  How do I manage my blood sugar before surgery? Check your blood sugar at least 4 times a day, starting 2 days before surgery, to make sure that the level is not too high or low. Check your blood sugar the  morning of your surgery when you wake up and every 2 hours until you get to the Short Stay unit. If your blood sugar is less than 70 mg/dL, you will need to treat for low blood sugar: Do not take insulin. Treat a low blood sugar (less than 70 mg/dL) with  cup of clear juice (cranberry or apple), 4 glucose tablets, OR glucose gel. Recheck blood sugar in 15 minutes after treatment (to make sure it is greater than 70 mg/dL). If your blood sugar is not greater than 70 mg/dL on recheck, call [phone]  for further instructions. Report your blood sugar to the short stay nurse when you get to Short Stay.  If you are admitted to the hospital after surgery: Your blood sugar will be checked by the staff and you will probably be given insulin after surgery (instead of oral diabetes medicines) to make sure you have good blood sugar levels. The goal for blood sugar control after surgery is 80-180 mg/dL     Special instructions:    [facility]- Preparing For Surgery  Before surgery, you can play an important role. Because skin is not sterile, your skin needs to be as free of germs as possible. You can reduce the number of germs on your skin by washing with CHG (chlorahexidine gluconate) Soap before surgery.  CHG is an antiseptic cleaner which kills germs and bonds with the skin to continue killing germs even after washing.    Oral Hygiene  is also important to reduce your risk of infection.  Remember - BRUSH YOUR TEETH THE MORNING OF SURGERY WITH YOUR REGULAR TOOTHPASTE  Please do not use if you have an allergy to CHG or antibacterial soaps. If your skin becomes reddened/irritated stop using the CHG.  Do not shave (including legs and underarms) for at least 48 hours prior to first CHG shower. It is OK to shave your face.  Please follow these instructions carefully.   Shower the NIGHT BEFORE SURGERY and the MORNING OF SURGERY with CHG.   If you chose to wash your hair, wash your hair first as usual  with your normal shampoo.  After you shampoo, wash your face and private area with the soap you use at home, then rinse your hair and body thoroughly to remove the shampoo and soap.  Use CHG as you would any other liquid soap. You can apply CHG directly to the skin and wash gently with a scrungie or a clean washcloth.   Apply the CHG Soap to your body ONLY FROM THE NECK DOWN.  Do not use on open wounds or open sores. Avoid contact with your eyes, ears, mouth and genitals (private parts).   Wash thoroughly, paying special attention to the area where your surgery will be performed.  Thoroughly rinse your body with warm water from the neck down.  DO NOT shower/wash with your normal soap after using and rinsing off the CHG Soap.  Pat yourself dry with a CLEAN TOWEL.  Wear CLEAN PAJAMAS to bed the night before surgery, wear comfortable clothes the morning of surgery  Place CLEAN SHEETS on your bed the night of your first shower and DO NOT SLEEP WITH PETS.  Day of Surgery: Shower as instructed above. Do not apply any deodorants/lotions, powders or colognes.  Please wear clean clothes to the hospital/surgery center.   Remember to brush your teeth WITH YOUR REGULAR TOOTHPASTE.  Do not wear jewelry, make-up or nail polish.  Do not shave 48 hours prior to surgery.  Men may shave face and neck.  Do not bring valuables to the hospital.  [facility] is not responsible for any belongings or valuables.  Contacts, dentures or bridgework may not be worn into surgery.  Leave your suitcase in the car.  After surgery it may be brought to your room.  For patients admitted to the hospital, discharge time will be determined by your treatment team.  Patients discharged the day of surgery will not be allowed to drive home.   Please read over the fact sheets that you were given.     SURGICAL WAITING ROOM VISITATION Patients having surgery or a procedure may have no more than 2 support people in the  waiting area - these visitors may rotate.   Children under the age of 82 must have an adult with them who is not the patient. If the patient needs to stay at the hospital during part of their recovery, the visitor guidelines for inpatient rooms apply. Pre-op nurse will coordinate an appropriate time for 1 support person to accompany patient in pre-op.  This support person may not rotate.    Please refer to the [facility] website for the visitor guidelines for Inpatients (after your surgery is over and you are in a regular room).

## 2022-02-02 ENCOUNTER — Encounter (HOSPITAL_COMMUNITY)
Admission: RE | Admit: 2022-02-02 | Discharge: 2022-02-02 | Disposition: A | Discharge status: Home / Self Care | Payer: BC Managed Care – PPO | Source: Ambulatory Visit | Attending: Neurosurgery | Admitting: Neurosurgery

## 2022-02-02 ENCOUNTER — Encounter (HOSPITAL_COMMUNITY): Payer: Self-pay

## 2022-02-02 ENCOUNTER — Other Ambulatory Visit: Payer: Self-pay

## 2022-02-02 VITALS — BP 112/79 | HR 61 | Temp 98.5°F | Resp 18 | Ht 73.0 in | Wt 180.0 lb

## 2022-02-02 DIAGNOSIS — I10 Essential (primary) hypertension: Secondary | ICD-10-CM | POA: Diagnosis not present

## 2022-02-02 DIAGNOSIS — Z9889 Other specified postprocedural states: Secondary | ICD-10-CM

## 2022-02-02 DIAGNOSIS — Z01818 Encounter for other preprocedural examination: Secondary | ICD-10-CM | POA: Insufficient documentation

## 2022-02-02 HISTORY — DX: Cerebral infarction, unspecified: I63.9

## 2022-02-02 HISTORY — DX: Gastro-esophageal reflux disease without esophagitis: K21.9

## 2022-02-02 HISTORY — DX: Paralytic syndrome, unspecified: G83.9

## 2022-02-02 LAB — CBC
HCT: 38.7 % — ABNORMAL LOW (ref 39.0–52.0)
Hemoglobin: 13.1 g/dL (ref 13.0–17.0)
MCH: 27.2 pg (ref 26.0–34.0)
MCHC: 33.9 g/dL (ref 30.0–36.0)
MCV: 80.5 fL (ref 80.0–100.0)
Platelets: 201 10*3/uL (ref 150–400)
RBC: 4.81 MIL/uL (ref 4.22–5.81)
RDW: 14.8 % (ref 11.5–15.5)
WBC: 6.1 10*3/uL (ref 4.0–10.5)
nRBC: 0 % (ref 0.0–0.2)

## 2022-02-02 LAB — TYPE AND SCREEN
ABO/RH(D): AB POS
Antibody Screen: NEGATIVE

## 2022-02-02 LAB — BASIC METABOLIC PANEL
Anion gap: 5 (ref 5–15)
BUN: 14 mg/dL (ref 6–20)
CO2: 27 mmol/L (ref 22–32)
Calcium: 9.9 mg/dL (ref 8.9–10.3)
Chloride: 109 mmol/L (ref 98–111)
Creatinine, Ser: 0.85 mg/dL (ref 0.61–1.24)
GFR, Estimated: >60 mL/min (ref >60)
Glucose, Bld: 243 mg/dL — ABNORMAL HIGH (ref 70–99)
Potassium: 3.7 mmol/L (ref 3.5–5.1)
Sodium: 141 mmol/L (ref 135–145)

## 2022-02-02 LAB — GLUCOSE, CAPILLARY: Glucose-Capillary: 229 mg/dL — ABNORMAL HIGH (ref 70–99)

## 2022-02-02 NOTE — Progress Notes (Addendum)
PCP - Dr. Kandice Hams  Neurologist -Dr. Pearlean Brownie  EP-no  Endocrine-np  Pulm-no  Chest x-ray - 09/28/21  EKG - 09/12/21  ECHO - 10/02/21  Cardiac Cath - no  AICD-no PM-no LOOP-no  Sleep Study - no CPAP - no  LABS-t/s  ASA-  ERAS-no  HA1C-no Fasting Blood Sugar - na Checks Blood Sugar __na___ times a day  Anesthesia-  Pt denies having chest pain, sob, or fever at this time. All instructions explained to the pt, with a verbal understanding of the material. Pt agrees to go over the instructions while at home for a better understanding. Pt also instructed to self quarantine after being tested for COVID-19. The opportunity to ask questions was provided.

## 2022-02-05 ENCOUNTER — Other Ambulatory Visit: Payer: Self-pay

## 2022-02-05 NOTE — Patient Instructions (Signed)
Visit Information  Jacob [name] was given information about Medicaid Managed Care team care coordination services as a part of their Healthy [facility] Medicaid benefit. [name] verbally consented to engagement with the [facility] Managed Care team.   If you are experiencing a medical emergency, please call 911 or report to your local emergency department or urgent care.   If you have a non-emergency medical problem during routine business hours, please contact your provider's office and ask to speak with a nurse.   For questions related to your Healthy [facility] health plan, please call: [phone] or visit the homepage here: [email or web address]  If you would like to schedule transportation through your Healthy [facility] plan, please call the following number at least 2 days in advance of your appointment: [phone]  For information about your ride after you set it up, call Ride Assist at [phone]. Use this number to activate a Will Call pickup, or if your transportation is late for a scheduled pickup. Use this number, too, if you need to make a change or cancel a previously scheduled reservation.  If you need transportation services right away, call [phone]. The after-hours call center is staffed 24 hours to handle ride assistance and urgent reservation requests (including discharges) 365 days a year. Urgent trips include sick visits, hospital discharge requests and life-sustaining treatment.  Call the [facility] Line at [phone], at any time, 24 hours a day, 7 days a week. If you are in danger or need immediate medical attention call 911.  If you would like help to quit smoking, call 1-800-QUIT-NOW (662-010-0049) OR Espaol: 1-855-Djelo-Ya (7-893-810-1751) o para ms informacin haga clic aqu or Text READY to 025-852 to register via text  Mr. Hearns - following are the goals we discussed in your visit today:   Goals  Addressed   None      Social Worker will follow up in 7 days .   [name], BSW, [facility]  [place]  High Risk Managed Medicaid Team  [phone]   Following is a copy of your plan of care:  There are no care plans that you recently modified to display for this patient.

## 2022-02-05 NOTE — Patient Outreach (Addendum)
BSW completed a telephone outreach with patients wife. She stated he has been home for 2 weeks from [facility]. Wife states he is going to have surgery next week and wanted to know who to speak with in order to get patient sent to rehab from the hospital after surgery. BSW informed wife she would have to speak with patients PCP. She stated PCS were needed. BSW explained the process and will send patients new PCP a message about getting the process started. No other resources are needed at this time. BSW did not see patients appointment scheduled for 9/15. Patients wife stated PCP was [facility] in [place], [place] contacted office and spoke with Lisette Abu, she stated she would put in patients appointment notes for PCP to speak with patient and wife about it.   [name], BSW, [facility]  [place]  High Risk Managed Medicaid Team  [phone]

## 2022-02-07 DIAGNOSIS — J9621 Acute and chronic respiratory failure with hypoxia: Secondary | ICD-10-CM | POA: Diagnosis not present

## 2022-02-07 DIAGNOSIS — I82451 Acute embolism and thrombosis of right peroneal vein: Secondary | ICD-10-CM | POA: Diagnosis not present

## 2022-02-07 DIAGNOSIS — I69311 Memory deficit following cerebral infarction: Secondary | ICD-10-CM | POA: Diagnosis not present

## 2022-02-07 DIAGNOSIS — F411 Generalized anxiety disorder: Secondary | ICD-10-CM | POA: Diagnosis not present

## 2022-02-07 DIAGNOSIS — I69391 Dysphagia following cerebral infarction: Secondary | ICD-10-CM | POA: Diagnosis not present

## 2022-02-07 DIAGNOSIS — Z7901 Long term (current) use of anticoagulants: Secondary | ICD-10-CM | POA: Diagnosis not present

## 2022-02-07 DIAGNOSIS — E119 Type 2 diabetes mellitus without complications: Secondary | ICD-10-CM | POA: Diagnosis not present

## 2022-02-07 DIAGNOSIS — I69354 Hemiplegia and hemiparesis following cerebral infarction affecting left non-dominant side: Secondary | ICD-10-CM | POA: Diagnosis not present

## 2022-02-07 DIAGNOSIS — I69322 Dysarthria following cerebral infarction: Secondary | ICD-10-CM | POA: Diagnosis not present

## 2022-02-07 DIAGNOSIS — I10 Essential (primary) hypertension: Secondary | ICD-10-CM | POA: Diagnosis not present

## 2022-02-07 DIAGNOSIS — Z794 Long term (current) use of insulin: Secondary | ICD-10-CM | POA: Diagnosis not present

## 2022-02-09 ENCOUNTER — Inpatient Hospital Stay (HOSPITAL_COMMUNITY): Payer: Medicaid Other | Admitting: Certified Registered"

## 2022-02-09 ENCOUNTER — Other Ambulatory Visit: Payer: Self-pay

## 2022-02-09 ENCOUNTER — Other Ambulatory Visit: Payer: Self-pay | Admitting: Neurosurgery

## 2022-02-09 ENCOUNTER — Inpatient Hospital Stay (HOSPITAL_COMMUNITY)
Admission: RE | Admit: 2022-02-09 | Discharge: 2022-02-14 | DRG: 580 | Disposition: A | Discharge status: Rehabilitation Facility | Payer: Medicaid Other | Attending: Neurosurgery | Admitting: Neurosurgery

## 2022-02-09 ENCOUNTER — Encounter (HOSPITAL_COMMUNITY): Payer: Self-pay | Admitting: Neurosurgery

## 2022-02-09 ENCOUNTER — Encounter (HOSPITAL_COMMUNITY)
Admission: RE | Disposition: A | Discharge status: Rehabilitation Facility | Payer: Self-pay | Source: Home / Self Care | Attending: Neurosurgery

## 2022-02-09 DIAGNOSIS — Z794 Long term (current) use of insulin: Secondary | ICD-10-CM

## 2022-02-09 DIAGNOSIS — Z428 Encounter for other plastic and reconstructive surgery following medical procedure or healed injury: Secondary | ICD-10-CM | POA: Diagnosis not present

## 2022-02-09 DIAGNOSIS — A0472 Enterocolitis due to Clostridium difficile, not specified as recurrent: Secondary | ICD-10-CM | POA: Diagnosis present

## 2022-02-09 DIAGNOSIS — Z79899 Other long term (current) drug therapy: Secondary | ICD-10-CM | POA: Diagnosis not present

## 2022-02-09 DIAGNOSIS — Z7901 Long term (current) use of anticoagulants: Secondary | ICD-10-CM | POA: Diagnosis not present

## 2022-02-09 DIAGNOSIS — M952 Other acquired deformity of head: Secondary | ICD-10-CM | POA: Diagnosis not present

## 2022-02-09 DIAGNOSIS — Z888 Allergy status to other drugs, medicaments and biological substances status: Secondary | ICD-10-CM

## 2022-02-09 DIAGNOSIS — I10 Essential (primary) hypertension: Secondary | ICD-10-CM | POA: Diagnosis not present

## 2022-02-09 DIAGNOSIS — Z86718 Personal history of other venous thrombosis and embolism: Secondary | ICD-10-CM | POA: Diagnosis not present

## 2022-02-09 DIAGNOSIS — Z01818 Encounter for other preprocedural examination: Secondary | ICD-10-CM

## 2022-02-09 DIAGNOSIS — E119 Type 2 diabetes mellitus without complications: Secondary | ICD-10-CM | POA: Diagnosis present

## 2022-02-09 DIAGNOSIS — Z9889 Other specified postprocedural states: Secondary | ICD-10-CM | POA: Diagnosis not present

## 2022-02-09 DIAGNOSIS — Z930 Tracheostomy status: Secondary | ICD-10-CM | POA: Diagnosis not present

## 2022-02-09 DIAGNOSIS — R195 Other fecal abnormalities: Secondary | ICD-10-CM | POA: Diagnosis not present

## 2022-02-09 DIAGNOSIS — R5381 Other malaise: Secondary | ICD-10-CM | POA: Diagnosis not present

## 2022-02-09 DIAGNOSIS — Z825 Family history of asthma and other chronic lower respiratory diseases: Secondary | ICD-10-CM | POA: Diagnosis not present

## 2022-02-09 DIAGNOSIS — I69354 Hemiplegia and hemiparesis following cerebral infarction affecting left non-dominant side: Secondary | ICD-10-CM

## 2022-02-09 DIAGNOSIS — K219 Gastro-esophageal reflux disease without esophagitis: Secondary | ICD-10-CM | POA: Diagnosis present

## 2022-02-09 DIAGNOSIS — E1165 Type 2 diabetes mellitus with hyperglycemia: Secondary | ICD-10-CM | POA: Diagnosis not present

## 2022-02-09 DIAGNOSIS — I63311 Cerebral infarction due to thrombosis of right middle cerebral artery: Secondary | ICD-10-CM | POA: Diagnosis not present

## 2022-02-09 DIAGNOSIS — G8114 Spastic hemiplegia affecting left nondominant side: Secondary | ICD-10-CM | POA: Diagnosis not present

## 2022-02-09 DIAGNOSIS — M160 Bilateral primary osteoarthritis of hip: Secondary | ICD-10-CM | POA: Diagnosis not present

## 2022-02-09 DIAGNOSIS — F32A Depression, unspecified: Secondary | ICD-10-CM | POA: Diagnosis not present

## 2022-02-09 DIAGNOSIS — F329 Major depressive disorder, single episode, unspecified: Secondary | ICD-10-CM | POA: Diagnosis not present

## 2022-02-09 DIAGNOSIS — R32 Unspecified urinary incontinence: Secondary | ICD-10-CM | POA: Diagnosis not present

## 2022-02-09 HISTORY — PX: CRANIOPLASTY: SHX1407

## 2022-02-09 LAB — GLUCOSE, CAPILLARY
Glucose-Capillary: 204 mg/dL — ABNORMAL HIGH (ref 70–99)
Glucose-Capillary: 159 mg/dL — ABNORMAL HIGH (ref 70–99)
Glucose-Capillary: 177 mg/dL — ABNORMAL HIGH (ref 70–99)
Glucose-Capillary: 209 mg/dL — ABNORMAL HIGH (ref 70–99)

## 2022-02-09 LAB — SURGICAL PCR SCREEN
MRSA, PCR: POSITIVE — AB
Staphylococcus aureus: POSITIVE — AB

## 2022-02-09 SURGERY — CRANIOPLASTY
Anesthesia: General | Laterality: Right

## 2022-02-09 MED ORDER — DOCUSATE SODIUM 100 MG PO CAPS
100.0000 mg | ORAL_CAPSULE | Freq: Two times a day (BID) | ORAL | Status: DC
  Administered 2022-02-12 (×2): 100 mg via ORAL
  Filled 2022-02-09 (×5): qty 1

## 2022-02-09 MED ORDER — METOPROLOL TARTRATE 5 MG/5ML IV SOLN
INTRAVENOUS | Status: DC | PRN
  Administered 2022-02-09: 1 mg via INTRAVENOUS

## 2022-02-09 MED ORDER — PROPOFOL 10 MG/ML IV BOLUS
INTRAVENOUS | Status: AC
  Filled 2022-02-09: qty 20

## 2022-02-09 MED ORDER — BUPIVACAINE-EPINEPHRINE (PF) 0.5% -1:200000 IJ SOLN
INTRAMUSCULAR | Status: AC
  Filled 2022-02-09: qty 30

## 2022-02-09 MED ORDER — LABETALOL HCL 5 MG/ML IV SOLN
INTRAVENOUS | Status: DC | PRN
  Administered 2022-02-09 (×4): 5 mg via INTRAVENOUS

## 2022-02-09 MED ORDER — POTASSIUM CHLORIDE IN NACL 20-0.9 MEQ/L-% IV SOLN
INTRAVENOUS | Status: DC
  Filled 2022-02-09 (×7): qty 1000

## 2022-02-09 MED ORDER — ACETAMINOPHEN 325 MG PO TABS: 650.0000 mg | ORAL_TABLET | ORAL | Status: DC | PRN

## 2022-02-09 MED ORDER — FENTANYL CITRATE (PF) 250 MCG/5ML IJ SOLN
INTRAMUSCULAR | Status: AC
  Filled 2022-02-09: qty 5

## 2022-02-09 MED ORDER — HYDRALAZINE HCL 20 MG/ML IJ SOLN
INTRAMUSCULAR | Status: AC
  Filled 2022-02-09: qty 1

## 2022-02-09 MED ORDER — LIDOCAINE 2% (20 MG/ML) 5 ML SYRINGE
INTRAMUSCULAR | Status: AC
  Filled 2022-02-09: qty 5

## 2022-02-09 MED ORDER — 0.9 % SODIUM CHLORIDE (POUR BTL) OPTIME
TOPICAL | Status: DC | PRN
  Administered 2022-02-09: 1000 mL

## 2022-02-09 MED ORDER — DEXAMETHASONE SODIUM PHOSPHATE 10 MG/ML IJ SOLN
INTRAMUSCULAR | Status: AC
  Filled 2022-02-09: qty 1

## 2022-02-09 MED ORDER — HYDRALAZINE HCL 20 MG/ML IJ SOLN
10.0000 mg | Freq: Once | INTRAMUSCULAR | Status: AC
  Administered 2022-02-09: 10 mg via INTRAVENOUS

## 2022-02-09 MED ORDER — INSULIN ASPART 100 UNIT/ML IJ SOLN
INTRAMUSCULAR | Status: AC
  Filled 2022-02-09: qty 1

## 2022-02-09 MED ORDER — DEXAMETHASONE SODIUM PHOSPHATE 10 MG/ML IJ SOLN
INTRAMUSCULAR | Status: DC | PRN
  Administered 2022-02-09: 5 mg via INTRAVENOUS

## 2022-02-09 MED ORDER — THROMBIN 20000 UNITS EX SOLR
CUTANEOUS | Status: AC
  Filled 2022-02-09: qty 20000

## 2022-02-09 MED ORDER — LABETALOL HCL 5 MG/ML IV SOLN
10.0000 mg | INTRAVENOUS | Status: DC | PRN
  Administered 2022-02-09 (×2): 10 mg via INTRAVENOUS
  Administered 2022-02-10: 40 mg via INTRAVENOUS
  Administered 2022-02-10: 20 mg via INTRAVENOUS
  Administered 2022-02-10 (×2): 40 mg via INTRAVENOUS
  Filled 2022-02-09: qty 4
  Filled 2022-02-09: qty 8
  Filled 2022-02-09 (×2): qty 4
  Filled 2022-02-09: qty 8

## 2022-02-09 MED ORDER — ONDANSETRON HCL 4 MG/2ML IJ SOLN
INTRAMUSCULAR | Status: AC
  Filled 2022-02-09: qty 2

## 2022-02-09 MED ORDER — ROCURONIUM BROMIDE 10 MG/ML (PF) SYRINGE
PREFILLED_SYRINGE | INTRAVENOUS | Status: AC
  Filled 2022-02-09: qty 20

## 2022-02-09 MED ORDER — CHLORHEXIDINE GLUCONATE 0.12 % MT SOLN
15.0000 mL | Freq: Once | OROMUCOSAL | Status: AC
  Administered 2022-02-09: 15 mL via OROMUCOSAL
  Filled 2022-02-09: qty 15

## 2022-02-09 MED ORDER — METOPROLOL TARTRATE 5 MG/5ML IV SOLN
INTRAVENOUS | Status: AC
  Filled 2022-02-09: qty 5

## 2022-02-09 MED ORDER — CHLORHEXIDINE GLUCONATE CLOTH 2 % EX PADS: 6.0000 | MEDICATED_PAD | Freq: Once | CUTANEOUS | Status: DC

## 2022-02-09 MED ORDER — FENTANYL CITRATE (PF) 100 MCG/2ML IJ SOLN
25.0000 ug | INTRAMUSCULAR | Status: DC | PRN
  Administered 2022-02-09: 50 ug via INTRAVENOUS
  Administered 2022-02-09: 25 ug via INTRAVENOUS

## 2022-02-09 MED ORDER — MIDAZOLAM HCL 2 MG/2ML IJ SOLN
INTRAMUSCULAR | Status: AC
  Filled 2022-02-09: qty 2

## 2022-02-09 MED ORDER — FENTANYL CITRATE (PF) 250 MCG/5ML IJ SOLN
INTRAMUSCULAR | Status: DC | PRN
  Administered 2022-02-09 (×6): 50 ug via INTRAVENOUS

## 2022-02-09 MED ORDER — ACETAMINOPHEN 650 MG RE SUPP: 650.0000 mg | RECTAL | Status: DC | PRN

## 2022-02-09 MED ORDER — LABETALOL HCL 5 MG/ML IV SOLN
INTRAVENOUS | Status: AC
  Filled 2022-02-09: qty 4

## 2022-02-09 MED ORDER — CEFAZOLIN SODIUM-DEXTROSE 2-4 GM/100ML-% IV SOLN
2.0000 g | INTRAVENOUS | Status: AC
  Administered 2022-02-09: 2 g via INTRAVENOUS
  Filled 2022-02-09: qty 100

## 2022-02-09 MED ORDER — THROMBIN 20000 UNITS EX SOLR
CUTANEOUS | Status: DC | PRN
  Administered 2022-02-09: 20 mL via TOPICAL

## 2022-02-09 MED ORDER — BACITRACIN ZINC 500 UNIT/GM EX OINT
TOPICAL_OINTMENT | CUTANEOUS | Status: AC
  Filled 2022-02-09: qty 28.35

## 2022-02-09 MED ORDER — OXYCODONE-ACETAMINOPHEN 5-325 MG PO TABS
1.0000 | ORAL_TABLET | ORAL | Status: DC | PRN
  Administered 2022-02-10 – 2022-02-14 (×11): 1 via ORAL
  Filled 2022-02-09 (×11): qty 1

## 2022-02-09 MED ORDER — ONDANSETRON HCL 4 MG PO TABS: 4.0000 mg | ORAL_TABLET | ORAL | Status: DC | PRN

## 2022-02-09 MED ORDER — INSULIN ASPART 100 UNIT/ML IJ SOLN
0.0000 [IU] | INTRAMUSCULAR | Status: AC | PRN
  Administered 2022-02-09: 4 [IU] via SUBCUTANEOUS
  Administered 2022-02-09: 2 [IU] via SUBCUTANEOUS

## 2022-02-09 MED ORDER — PHENYLEPHRINE HCL-NACL 20-0.9 MG/250ML-% IV SOLN
INTRAVENOUS | Status: DC | PRN
  Administered 2022-02-09: 15 ug/min via INTRAVENOUS

## 2022-02-09 MED ORDER — ACETAMINOPHEN 10 MG/ML IV SOLN
1000.0000 mg | Freq: Once | INTRAVENOUS | Status: AC
  Administered 2022-02-09: 1000 mg via INTRAVENOUS
  Filled 2022-02-09: qty 100

## 2022-02-09 MED ORDER — INSULIN ASPART 100 UNIT/ML IJ SOLN
0.0000 [IU] | Freq: Three times a day (TID) | INTRAMUSCULAR | Status: DC
  Administered 2022-02-10 – 2022-02-11 (×6): 4 [IU] via SUBCUTANEOUS
  Administered 2022-02-12: 3 [IU] via SUBCUTANEOUS
  Administered 2022-02-12 – 2022-02-13 (×3): 4 [IU] via SUBCUTANEOUS
  Administered 2022-02-13: 3 [IU] via SUBCUTANEOUS
  Administered 2022-02-14: 4 [IU] via SUBCUTANEOUS
  Administered 2022-02-14: 3 [IU] via SUBCUTANEOUS

## 2022-02-09 MED ORDER — CHLORHEXIDINE GLUCONATE CLOTH 2 % EX PADS
6.0000 | MEDICATED_PAD | Freq: Every day | CUTANEOUS | Status: AC
  Administered 2022-02-10 – 2022-02-14 (×5): 6 via TOPICAL

## 2022-02-09 MED ORDER — AMISULPRIDE (ANTIEMETIC) 5 MG/2ML IV SOLN: 10.0000 mg | Freq: Once | INTRAVENOUS | Status: DC | PRN

## 2022-02-09 MED ORDER — SUGAMMADEX SODIUM 200 MG/2ML IV SOLN
INTRAVENOUS | Status: DC | PRN
  Administered 2022-02-09: 200 mg via INTRAVENOUS

## 2022-02-09 MED ORDER — CEFAZOLIN SODIUM-DEXTROSE 2-4 GM/100ML-% IV SOLN
2.0000 g | Freq: Three times a day (TID) | INTRAVENOUS | Status: AC
  Administered 2022-02-09 – 2022-02-10 (×4): 2 g via INTRAVENOUS
  Filled 2022-02-09 (×4): qty 100

## 2022-02-09 MED ORDER — ALBUMIN HUMAN 5 % IV SOLN: INTRAVENOUS | Status: DC | PRN

## 2022-02-09 MED ORDER — LIDOCAINE 2% (20 MG/ML) 5 ML SYRINGE
INTRAMUSCULAR | Status: DC | PRN
  Administered 2022-02-09: 40 mg via INTRAVENOUS

## 2022-02-09 MED ORDER — ACETAMINOPHEN 500 MG PO TABS
1000.0000 mg | ORAL_TABLET | Freq: Once | ORAL | Status: DC
  Filled 2022-02-09: qty 2

## 2022-02-09 MED ORDER — PROPOFOL 10 MG/ML IV BOLUS
INTRAVENOUS | Status: DC | PRN
  Administered 2022-02-09: 120 mg via INTRAVENOUS
  Administered 2022-02-09: 30 mg via INTRAVENOUS
  Administered 2022-02-09: 50 mg via INTRAVENOUS

## 2022-02-09 MED ORDER — LACTATED RINGERS IV SOLN: INTRAVENOUS | Status: DC | PRN

## 2022-02-09 MED ORDER — ONDANSETRON HCL 4 MG/2ML IJ SOLN
4.0000 mg | INTRAMUSCULAR | Status: DC | PRN
  Administered 2022-02-10: 4 mg via INTRAVENOUS
  Filled 2022-02-09: qty 2

## 2022-02-09 MED ORDER — ONDANSETRON HCL 4 MG/2ML IJ SOLN
INTRAMUSCULAR | Status: DC | PRN
  Administered 2022-02-09: 4 mg via INTRAVENOUS

## 2022-02-09 MED ORDER — ORAL CARE MOUTH RINSE: 15.0000 mL | Freq: Once | OROMUCOSAL | Status: AC

## 2022-02-09 MED ORDER — PROMETHAZINE HCL 25 MG PO TABS
12.5000 mg | ORAL_TABLET | ORAL | Status: DC | PRN
  Administered 2022-02-14: 25 mg via ORAL
  Filled 2022-02-09: qty 1

## 2022-02-09 MED ORDER — LACTATED RINGERS IV SOLN: INTRAVENOUS | Status: DC

## 2022-02-09 MED ORDER — MUPIROCIN 2 % EX OINT
1.0000 | TOPICAL_OINTMENT | Freq: Two times a day (BID) | CUTANEOUS | Status: AC
  Administered 2022-02-09 – 2022-02-14 (×10): 1 via NASAL
  Filled 2022-02-09: qty 22

## 2022-02-09 MED ORDER — FENTANYL CITRATE (PF) 100 MCG/2ML IJ SOLN
INTRAMUSCULAR | Status: AC
  Administered 2022-02-09: 25 ug via INTRAVENOUS
  Filled 2022-02-09: qty 2

## 2022-02-09 MED ORDER — HYDROCODONE-ACETAMINOPHEN 5-325 MG PO TABS
1.0000 | ORAL_TABLET | ORAL | Status: DC | PRN
  Administered 2022-02-10 (×3): 1 via ORAL
  Filled 2022-02-09 (×4): qty 1

## 2022-02-09 MED ORDER — ROCURONIUM BROMIDE 10 MG/ML (PF) SYRINGE
PREFILLED_SYRINGE | INTRAVENOUS | Status: DC | PRN
  Administered 2022-02-09: 60 mg via INTRAVENOUS
  Administered 2022-02-09 (×3): 20 mg via INTRAVENOUS

## 2022-02-09 SURGICAL SUPPLY — 65 items
BACITRACIN OINTMENT IMPLANT
BAG COUNTER SPONGE SURGICOUNT (BAG) ×1 IMPLANT
BAG SPNG CNTER NS LX DISP (BAG) ×1
BAND INSRT 18 STRL LF DISP RB (MISCELLANEOUS)
BAND RUBBER #18 3X1/16 STRL (MISCELLANEOUS) ×2 IMPLANT
BIT DRILL WIRE PASS 1.3MM (BIT) IMPLANT
BLADE CLIPPER SPEC (BLADE) ×1 IMPLANT
BUR PRECISION FLUTE 6.0 (BURR) IMPLANT
BUR SPIRAL ROUTER 2.3 (BUR) IMPLANT
CANISTER SUCT 3000ML PPV (MISCELLANEOUS) ×1 IMPLANT
CARTRIDGE OIL MAESTRO DRILL (MISCELLANEOUS) ×1 IMPLANT
CLIP RANEY DISP (INSTRUMENTS) IMPLANT
CLIP VESOCCLUDE MED 6/CT (CLIP) IMPLANT
COVER BACK TABLE 60X90IN (DRAPES) IMPLANT
DIFFUSER DRILL AIR PNEUMATIC (MISCELLANEOUS) ×1 IMPLANT
DRAIN JACKSON PRATT 10MM FLAT (MISCELLANEOUS) IMPLANT
DRAPE NEUROLOGICAL W/INCISE (DRAPES) ×1 IMPLANT
DRAPE SURG 17X23 STRL (DRAPES) IMPLANT
DRAPE WARM FLUID 44X44 (DRAPES) ×1 IMPLANT
DRILL WIRE PASS 1.3MM (BIT)
ELECT REM PT RETURN 9FT ADLT (ELECTROSURGICAL) ×1
ELECTRODE REM PT RTRN 9FT ADLT (ELECTROSURGICAL) ×1 IMPLANT
EVACUATOR SILICONE 100CC (DRAIN) IMPLANT
GAUZE 4X4 16PLY ~~LOC~~+RFID DBL (SPONGE) IMPLANT
GAUZE SPONGE 4X4 12PLY STRL (GAUZE/BANDAGES/DRESSINGS) IMPLANT
GLOVE BIO SURGEON STRL SZ 6.5 (GLOVE) IMPLANT
GLOVE BIO SURGEON STRL SZ8 (GLOVE) ×1 IMPLANT
GLOVE BIO SURGEON STRL SZ8.5 (GLOVE) ×1 IMPLANT
GLOVE BIOGEL PI IND STRL 6.5 (GLOVE) IMPLANT
GLOVE BIOGEL PI IND STRL 7.5 (GLOVE) IMPLANT
GLOVE EXAM NITRILE XL STR (GLOVE) IMPLANT
GLOVE SURG SS PI 7.5 STRL IVOR (GLOVE) IMPLANT
GOWN STRL REUS W/ TWL LRG LVL3 (GOWN DISPOSABLE) IMPLANT
GOWN STRL REUS W/ TWL XL LVL3 (GOWN DISPOSABLE) IMPLANT
GOWN STRL REUS W/TWL LRG LVL3 (GOWN DISPOSABLE) ×2
GOWN STRL REUS W/TWL XL LVL3 (GOWN DISPOSABLE) ×1
KIT BASIN OR (CUSTOM PROCEDURE TRAY) ×1 IMPLANT
KIT TURNOVER KIT B (KITS) ×1 IMPLANT
MARKER SKIN DUAL TIP RULER LAB (MISCELLANEOUS) IMPLANT
NEEDLE HYPO 22GX1.5 SAFETY (NEEDLE) ×1 IMPLANT
NS IRRIG 1000ML POUR BTL (IV SOLUTION) ×1 IMPLANT
OIL CARTRIDGE MAESTRO DRILL (MISCELLANEOUS)
PACK BATTERY CMF DISP FOR DVR (ORTHOPEDIC DISPOSABLE SUPPLIES) IMPLANT
PACK CRANIOTOMY CUSTOM (CUSTOM PROCEDURE TRAY) ×1 IMPLANT
PAD ARMBOARD 7.5X6 YLW CONV (MISCELLANEOUS) ×1 IMPLANT
PATTIES SURGICAL .25X.25 (GAUZE/BANDAGES/DRESSINGS) IMPLANT
PATTIES SURGICAL .5 X.5 (GAUZE/BANDAGES/DRESSINGS) IMPLANT
PATTIES SURGICAL .5 X3 (DISPOSABLE) IMPLANT
PATTIES SURGICAL 1X1 (DISPOSABLE) IMPLANT
PIN MAYFIELD SKULL DISP (PIN) IMPLANT
PLATE BONE 12 2H TARGET XL (Plate) IMPLANT
PLATE CRANIAL 12 2H RIGID UNI (Plate) IMPLANT
SCREW UNIII AXS SD 1.5X4 (Screw) IMPLANT
SPONGE NEURO XRAY DETECT 1X3 (DISPOSABLE) IMPLANT
SPONGE SURGIFOAM ABS GEL 100 (HEMOSTASIS) ×1 IMPLANT
STAPLER SKIN PROX WIDE 3.9 (STAPLE) ×1 IMPLANT
SUT ETHILON 3 0 FSL (SUTURE) IMPLANT
SUT NURALON 4 0 TR CR/8 (SUTURE) ×1 IMPLANT
SUT VIC AB 2-0 CP2 18 (SUTURE) ×1 IMPLANT
TAPE PAPER 1/2X10 TAN MEDIPORE (MISCELLANEOUS) IMPLANT
TOWEL GREEN STERILE (TOWEL DISPOSABLE) ×1 IMPLANT
TOWEL GREEN STERILE FF (TOWEL DISPOSABLE) ×1 IMPLANT
TRAY FOLEY MTR SLVR 16FR STAT (SET/KITS/TRAYS/PACK) IMPLANT
UNDERPAD 30X36 HEAVY ABSORB (UNDERPADS AND DIAPERS) IMPLANT
WATER STERILE IRR 1000ML POUR (IV SOLUTION) ×1 IMPLANT

## 2022-02-09 NOTE — Anesthesia Procedure Notes (Signed)
Procedure Name: Intubation Date/Time: 02/09/2022 8:05 AM  Performed by: Colin Benton, CRNAPre-anesthesia Checklist: Patient identified, Emergency Drugs available, Suction available and Patient being monitored Patient Re-evaluated:Patient Re-evaluated prior to induction Oxygen Delivery Method: Circle system utilized Preoxygenation: Pre-oxygenation with 100% oxygen Induction Type: IV induction Ventilation: Mask ventilation without difficulty Laryngoscope Size: Glidescope and 4 Grade View: Grade I Tube type: Oral Tube size: 7.0 mm Number of attempts: 2 Airway Equipment and Method: Rigid stylet and Video-laryngoscopy Placement Confirmation: ETT inserted through vocal cords under direct vision, positive ETCO2 and breath sounds checked- equal and bilateral Secured at: 22 cm Tube secured with: Tape Dental Injury: Teeth and Oropharynx as per pre-operative assessment  Difficulty Due To: Difficulty was anticipated and Difficult Airway- due to reduced neck mobility Comments: DL x 1 with Mil 2 with grade 3 view. Esophageal intubation.  Easy BMV. DL x 2 with glidescope 4.  Grade 1 view.  EBBS and VSS.

## 2022-02-09 NOTE — Anesthesia Preprocedure Evaluation (Signed)
Anesthesia Evaluation  Patient identified by MRN, date of birth, ID band Patient awake    Reviewed: Allergy & Precautions, NPO status , Patient's Chart, lab work & pertinent test results  Airway Mallampati: II  TM Distance: >3 FB Neck ROM: Full    Dental  (+) Teeth Intact, Dental Advisory Given   Pulmonary neg pulmonary ROS,    breath sounds clear to auscultation       Cardiovascular hypertension, Pt. on medications  Rhythm:Regular Rate:Normal     Neuro/Psych CVA, Residual Symptoms    GI/Hepatic Neg liver ROS, GERD  ,  Endo/Other  diabetes, Insulin Dependent  Renal/GU negative Renal ROS     Musculoskeletal   Abdominal Normal abdominal exam  (+)   Peds  Hematology negative hematology ROS (+)   Anesthesia Other Findings   Reproductive/Obstetrics                             Lab Results  Component Value Date   WBC 6.1 02/02/2022   HGB 13.1 02/02/2022   HCT 38.7 (L) 02/02/2022   MCV 80.5 02/02/2022   PLT 201 02/02/2022   Lab Results  Component Value Date   CREATININE 0.85 02/02/2022   BUN 14 02/02/2022   NA 141 02/02/2022   K 3.7 02/02/2022   CL 109 02/02/2022   CO2 27 02/02/2022    Anesthesia Physical  Anesthesia Plan  ASA: 3  Anesthesia Plan: General   Post-op Pain Management: Toradol IV (intra-op)* and Ofirmev IV (intra-op)*   Induction: Intravenous  PONV Risk Score and Plan: 2 and Dexamethasone, Ondansetron and Treatment may vary due to age or medical condition  Airway Management Planned: Oral ETT  Additional Equipment: None  Intra-op Plan:   Post-operative Plan: Extubation in OR  Informed Consent: I have reviewed the patients History and Physical, chart, labs and discussed the procedure including the risks, benefits and alternatives for the proposed anesthesia with the patient or authorized representative who has indicated his/her understanding and acceptance.      Consent reviewed with POA  Plan Discussed with: CRNA  Anesthesia Plan Comments:         Anesthesia Quick Evaluation

## 2022-02-09 NOTE — H&P (Signed)
Subjective: Patient is a 46 year old black male on whom I performed a right decompressive craniectomy on 09/15/2021 after a cerebrovascular accident.  The patient has covered from his stroke.  He is left hemiplegic.  His craniectomy flap is sunken and the patient and his wife have requested to have it replaced.  We discussed the risk, benefits and alternatives.  Past Medical History:  Diagnosis Date   Allergy    Diabetes mellitus without complication (HCC)    GERD (gastroesophageal reflux disease)    Hypertension    Paralysis (HCC)    Stroke [facility])     Past Surgical History:  Procedure Laterality Date   APPENDECTOMY     BUBBLE STUDY  10/02/2021   Procedure: BUBBLE STUDY;  Surgeon: Pricilla Riffle, MD;  Location: [facility] ENDOSCOPY;  Service: Cardiovascular;;   CRANIOTOMY Right 09/15/2021   Procedure: RIGHT DECOMPRESSIVE CRANIOTOMY, PLACEMENT OF BONE FLAP IN ABDOMEN;  Surgeon: Tressie Stalker, MD;  Location: [facility] OR;  Service: Neurosurgery;  Laterality: Right;   IR CT HEAD LTD  09/12/2021   IR PERCUTANEOUS ART THROMBECTOMY/INFUSION INTRACRANIAL INC DIAG ANGIO  09/12/2021   IR US GUIDE VASC ACCESS RIGHT  09/12/2021   LAPAROSCOPIC APPENDECTOMY  10/24/2013   Procedure: APPENDECTOMY LAPAROSCOPIC;  Surgeon: Kandis Cocking, MD;  Location: [facility] ORS;  Service: General;;   RADIOLOGY WITH ANESTHESIA N/A 09/12/2021   Procedure: IR WITH ANESTHESIA;  Surgeon: Julieanne Cotton, MD;  Location: [facility] OR;  Service: Radiology;  Laterality: N/A;   TEE WITHOUT CARDIOVERSION N/A 10/02/2021   Procedure: TRANSESOPHAGEAL ECHOCARDIOGRAM (TEE);  Surgeon: Pricilla Riffle, MD;  Location: [facility] ENDOSCOPY;  Service: Cardiovascular;  Laterality: N/A;    Allergies  Allergen Reactions   Hydrochlorothiazide Other (See Comments)    Bilateral muscle cramping    Social History   Tobacco Use   Smoking status: Never   Smokeless tobacco: Never  Substance Use Topics   Alcohol use: Not Currently    Family History  Problem Relation Age of Onset    Asthma Son    Prior to Admission medications   Medication Sig Start Date End Date Taking? Authorizing Provider  amLODipine (NORVASC) 10 MG tablet Place 1 tablet (10 mg total) into feeding tube daily. Patient taking differently: Take 10 mg by mouth daily. 10/09/21  Yes de [name], Cortney E, NP  apixaban (ELIQUIS) 5 MG TABS tablet Take 1 tablet (5 mg total) by mouth 2 (two) times daily. 10/08/21  Yes de [name], Cortney E, NP  atorvastatin (LIPITOR) 40 MG tablet Place 1 tablet (40 mg total) into feeding tube daily. Patient taking differently: Take 40 mg by mouth daily. 10/09/21  Yes de [name], Cortney E, NP  cloNIDine (CATAPRES - DOSED IN MG/24 HR) 0.3 mg/24hr patch Place 1 patch (0.3 mg total) onto the skin once a week. 10/09/21  Yes de [name], Cortney E, NP  Ensure Plus (ENSURE PLUS) LIQD Take 237 mLs by mouth in the morning, at noon, in the evening, and at bedtime.   Yes [provider]  escitalopram (LEXAPRO) 20 MG tablet Place 1 tablet (20 mg total) into feeding tube daily. Patient taking differently: Take 20 mg by mouth daily. 10/09/21  Yes de [name], Cortney E, NP  famotidine (PEPCID) 20 MG tablet Take 20 mg by mouth 2 (two) times daily.   Yes [provider]  insulin aspart (NOVOLOG) 100 UNIT/ML injection Inject 0-15 Units into the skin every 4 (four) hours. Patient taking differently: Inject 0-10 Units into the  skin 4 (four) times daily -  before meals and at bedtime. Blood glucose of 150 and below = 0 units, 151-200= 2 units, 201-250= 4 units, 251-300= 6 units, 301-350= 8 units, 351-400= 10 units, 401--999 If CBG is >400 NOTIFY MD/NP 10/08/21  Yes de [name], Cortney E, NP  insulin glargine (LANTUS) 100 UNIT/ML injection Inject 17 Units into the skin every 12 (twelve) hours.   Yes [provider]  lisinopril (ZESTRIL) 40 MG tablet Place 1 tablet (40 mg total) into feeding tube daily. 10/09/21  Yes de [name], Cortney E, NP  melatonin 3 MG TABS tablet Take 3  mg by mouth at bedtime.   Yes [provider]  metoprolol tartrate (LOPRESSOR) 25 MG tablet Take 25 mg by mouth 2 (two) times daily.   Yes [provider]  polyethylene glycol (MIRALAX / GLYCOLAX) 17 g packet Place 17 g into feeding tube daily as needed for mild constipation, moderate constipation or severe constipation. 10/08/21  Yes de [name], Cortney E, NP  topiramate (TOPAMAX) 50 MG tablet Place 1 tablet (50 mg total) into feeding tube 2 (two) times daily. Patient taking differently: Take 50 mg by mouth every 12 (twelve) hours. 10/08/21  Yes de [name], Cortney E, NP  traZODone (DESYREL) 50 MG tablet Take 25 mg by mouth at bedtime.   Yes [provider]  acetaminophen (TYLENOL) 160 MG/5ML solution Place 20.3 mLs (650 mg total) into feeding tube every 4 (four) hours as needed for mild pain (or temp > 37.5 C (99.5 F)). Patient not taking: Reported on 12/31/2021 10/08/21   de [name], Dewitt Hoes E, NP  acetaminophen (TYLENOL) 325 MG tablet Take 650 mg by mouth every 12 (twelve) hours as needed for moderate pain.    [provider]  albuterol (PROVENTIL) (2.5 MG/3ML) 0.083% nebulizer solution Take 3 mLs (2.5 mg total) by nebulization every 6 (six) hours as needed for wheezing or shortness of breath. Patient not taking: Reported on 12/08/2021 10/08/21   de [name], Cortney E, NP  bisacodyl (DULCOLAX) 10 MG suppository Place 1 suppository (10 mg total) rectally daily at 2 PM. Patient not taking: Reported on 12/08/2021 10/08/21   de [name], Cortney E, NP  carvedilol (COREG) 25 MG tablet Place 1 tablet (25 mg total) into feeding tube 2 (two) times daily with a meal. Patient not taking: Reported on 12/08/2021 10/08/21   de [name], Cortney E, NP  chlorproMAZINE (THORAZINE) 10 MG tablet Place 1 tablet (10 mg total) into feeding tube 3 (three) times daily as needed for hiccoughs. Patient not taking: Reported on 12/08/2021 10/08/21   de [name], Cortney E, NP  insulin aspart  (NOVOLOG) 100 UNIT/ML injection Inject 8 Units into the skin 4 (four) times daily. Patient not taking: Reported on 12/31/2021 10/08/21   de [name], Cortney E, NP  insulin detemir (LEVEMIR) 100 UNIT/ML injection Inject 0.5 mLs (50 Units total) into the skin 2 (two) times daily. Patient not taking: Reported on 12/08/2021 10/08/21   de [name], Cortney E, NP  insulin glargine (LANTUS) 100 UNIT/ML injection Inject 17 Units into the skin 2 (two) times daily.    [provider]  linezolid (ZYVOX) 600 MG tablet Place 1 tablet (600 mg total) into feeding tube every 12 (twelve) hours. Patient not taking: Reported on 12/08/2021 10/08/21   de [name], Cortney E, NP  ondansetron [facility]) 4 MG/2ML SOLN injection Inject 2 mLs (4 mg total) into the vein every  4 (four) hours as needed for nausea or vomiting. Patient not taking: Reported on 12/31/2021 10/08/21   de Yolanda Manges, Cortney E, NP  oxyCODONE (OXY IR/ROXICODONE) 5 MG immediate release tablet Take 2.5 mg by mouth every 6 (six) hours as needed for pain. 12/02/21   [provider]  pantoprazole sodium (PROTONIX) 40 mg Place 40 mg into feeding tube daily. Patient not taking: Reported on 12/08/2021 10/08/21   de Everlena Cooper E, NP     Review of Systems  Positive ROS: As above  All other systems have been reviewed and were otherwise negative with the exception of those mentioned in the HPI and as above.  Objective: Vital signs in last 24 hours: Temp:  [97.5 F (36.4 C)] 97.5 F (36.4 C) (09/18 0604) Pulse Rate:  [64] 64 (09/18 0604) Resp:  [18] 18 (09/18 0604) BP: (123)/(89) 123/89 (09/18 0604) SpO2:  [100 %] 100 % (09/18 0604) Weight:  [81.6 kg] 81.6 kg (09/18 0604) Estimated body mass index is 23.75 kg/m as calculated from the following:   Height as of this encounter: 6\' 1"  (1.854 m).   Weight as of this encounter: 81.6 kg.   Physical exam  General: An alert and pleasant 46 year old black male with a sunken right craniectomy  flap.  HEENT: As above  Thorax: Symmetric  Abdomen: Soft  Extremities: Unremarkable  Neurologic exam: Patient is alert and conversant.  Has a flat affect.  He is left hemiplegic.   Data Review Lab Results  Component Value Date   WBC 6.1 02/02/2022   HGB 13.1 02/02/2022   HCT 38.7 (L) 02/02/2022   MCV 80.5 02/02/2022   PLT 201 02/02/2022   Lab Results  Component Value Date   NA 141 02/02/2022   K 3.7 02/02/2022   CL 109 02/02/2022   CO2 27 02/02/2022   BUN 14 02/02/2022   CREATININE 0.85 02/02/2022   GLUCOSE 243 (H) 02/02/2022   Lab Results  Component Value Date   INR 1.0 09/12/2021    Assessment/Plan: Status post decompressive hemicraniectomy: I discussed the situation with the patient and his wife.  We discussed the option of replacing his craniectomy flap.  We discussed the risk, benefits, alternatives, expected postop course, and likelihood of achieving our goals with surgery.  I have answered all the questions.  He has decided proceed with surgery.   [name] 02/09/2022 7:37 AM

## 2022-02-09 NOTE — Op Note (Signed)
Brief history: The patient is a 46 year old black male who had a right brain stroke in April 2023.  He had brain swelling and underwent a right decompressive craniectomy.  He has recovered and his craniotomy flap is sunken.  He is elected to have his craniectomy flap replaced.  Preop diagnosis: Status post craniectomy  Postop diagnosis: The same  Procedure: Right cranioplasty  Surgeon: Dr. Earle Gell  Assistant: Arnetha Massy, NP  Anesthesia: General tracheal  Estimated blood loss: 200 cc  Specimens: None  Drains: 1 subgaleal Jackson-Pratt drain, 1 subcutaneous abdominal Jackson-Pratt drain  Complications: None  Description of procedure: The patient was brought to the operating room by the anesthesia team.  General endotracheal anesthesia was induced.  The patient remained in the supine position.  I applied the [place]field three-point headrest to his calvarium.  His head was turned to the left exposing his right scalp.  His right scalp was shaved with clippers and this region as well as his abdominal region was then prepared with Betadine scrub and Betadine solution.  Sterile drapes were applied.  I then used a scalpel to make tissue through the patient's prior craniotomy scar.  We used Raney clips for wound edge hemostasis.  I then used electrocautery to expose the underlying calvarium and to dissect the scalp flap away from the patient's brain/dura.  We defined the edges of the craniotomy with electrocautery.  We used towel clamps and rubber bands and Allis clamps for exposure.  I then made a second incision the patient's abdomen through his prior surgical scar.  We used cerebellar retractor for exposure.  We exposed the underlying craniectomy flap and then removed it.  I then replaced the patient's craniotomy flap with titanium mini plates and screws.  We then remove the retractor.  We placed a 10 mm flat Jackson-Pratt drain in the subgaleal space and tunneled it out through a separate  stab wound.  I reapproximated the patient's galea with interrupted 2-0 Vicryl suture.  We reapproximated the skin with stainless steel staples.  We then removed the abdominal retractors.  We placed a 10 mm flat Jackson-Pratt drain in the subcutaneous tissue and tunneled it out through a separate stab wound.  We then reapproximated patient's subcutaneous tissue with a rapid 2-0 Vicryl suture.  We reapproximated the skin with stainless steel staples.  The wounds were then coated with bacitracin ointment.  Sterile dressings were applied.  The drapes were removed.  I then remove the Mayfield three-point headrest from his calvarium.  By report all sponge, instrument, and needle counts were correct at the end of this case.

## 2022-02-09 NOTE — Transfer of Care (Signed)
Immediate Anesthesia Transfer of Care Note  Patient: Jacob [name]  Procedure(s) Performed: Craniectomy with Replacement of Bone Flap From the Abdomen (Right)  Patient Location: PACU  Anesthesia Type:General  Level of Consciousness: drowsy and patient cooperative  Airway & Oxygen Therapy: Patient Spontanous Breathing and Patient connected to nasal cannula oxygen  Post-op Assessment: Report given to RN and Post -op Vital signs reviewed and stable  Post vital signs: Reviewed and stable  Last Vitals:  Vitals Value Taken Time  BP 171/109 02/09/22 1105  Temp    Pulse 71 02/09/22 1108  Resp 14 02/09/22 1108  SpO2 100 % 02/09/22 1108  Vitals shown include unvalidated device data.  Last Pain:  Vitals:   02/09/22 3888  TempSrc:   PainSc: 0-No pain         Complications:  Encounter Notable Events  Notable Event Outcome Phase Comment  Difficult to intubate - expected  Intraprocedure Filed from anesthesia note documentation.

## 2022-02-09 NOTE — Anesthesia Postprocedure Evaluation (Signed)
Anesthesia Post Note  Patient: Jacob [name]  Procedure(s) Performed: Craniectomy with Replacement of Bone Flap From the Abdomen (Right)     Patient location during evaluation: PACU Anesthesia Type: General Level of consciousness: awake and alert Pain management: pain level controlled Vital Signs Assessment: post-procedure vital signs reviewed and stable Respiratory status: spontaneous breathing, nonlabored ventilation, respiratory function stable and patient connected to nasal cannula oxygen Cardiovascular status: blood pressure returned to baseline and stable Postop Assessment: no apparent nausea or vomiting Anesthetic complications: yes   Encounter Notable Events  Notable Event Outcome Phase Comment  Difficult to intubate - expected  Intraprocedure Filed from anesthesia note documentation.    Last Vitals:  Vitals:   02/09/22 1330 02/09/22 1545  BP: (!) 137/91   Pulse: 89   Resp: 14   Temp:  36.9 C  SpO2: 98%     Last Pain:  Vitals:   02/09/22 1530  TempSrc:   PainSc: Tyler Deis

## 2022-02-10 ENCOUNTER — Encounter (HOSPITAL_COMMUNITY): Payer: Self-pay | Admitting: Neurosurgery

## 2022-02-10 LAB — GLUCOSE, CAPILLARY
Glucose-Capillary: 151 mg/dL — ABNORMAL HIGH (ref 70–99)
Glucose-Capillary: 167 mg/dL — ABNORMAL HIGH (ref 70–99)
Glucose-Capillary: 184 mg/dL — ABNORMAL HIGH (ref 70–99)
Glucose-Capillary: 203 mg/dL — ABNORMAL HIGH (ref 70–99)

## 2022-02-10 MED ORDER — HYDRALAZINE HCL 10 MG PO TABS
5.0000 mg | ORAL_TABLET | ORAL | Status: DC | PRN
  Administered 2022-02-10 – 2022-02-11 (×2): 20 mg via ORAL
  Filled 2022-02-10 (×2): qty 2

## 2022-02-10 NOTE — Progress Notes (Signed)
Subjective: The patient is alert and pleasant.  He is in no apparent distress.  Objective: Vital signs in last 24 hours: Temp:  [97 F (36.1 C)-99.6 F (37.6 C)] 98.8 F (37.1 C) (09/19 0747) Pulse Rate:  [70-103] 103 (09/19 0327) Resp:  [12-16] 16 (09/19 0327) BP: (137-171)/(90-111) 153/111 (09/19 0747) SpO2:  [98 %-100 %] 99 % (09/19 0327) Estimated body mass index is 23.75 kg/m as calculated from the following:   Height as of this encounter: 6\' 1"  (1.854 m).   Weight as of this encounter: 81.6 kg.   Intake/Output from previous day: 09/18 0701 - 09/19 0700 In: 3001.3 [I.V.:2101.3; IV Piggyback:900] Out: 2387 [Urine:1550; Drains:237; Blood:600] Intake/Output this shift: No intake/output data recorded.  Physical exam the patient is alert and pleasant.  He is left hemiplegic without change.  His dressings are clean and dry.  I remove the drains.  Lab Results: No results for input(s): "WBC", "HGB", "HCT", "PLT" in the last 72 hours. BMET No results for input(s): "NA", "K", "CL", "CO2", "GLUCOSE", "BUN", "CREATININE", "CALCIUM" in the last 72 hours.  Studies/Results: No results found.  Assessment/Plan: Postoperative day #1 status post cranioplasty: The patient is doing well.  The patient and his wife were interested in rehab.  I will asked the rehab team to see him.  LOS: 1 day     [name] 02/10/2022, 7:59 AM

## 2022-02-10 NOTE — Evaluation (Signed)
Physical Therapy Evaluation Patient Details Name: Jacob [name] MRN: [id number] DOB: 07/16/1975 Today's Date: 02/10/2022  History of Present Illness  46 y.o. male admitted for cranioplasty performed 9/18 due to sunken craniectomy flap.  Previously underwent R frontotemporal parietal hemicraniectomy on 4/24 after being admitted with R MCA/PCA infarct on 09/12/21.   PMH includes HTN and DM, also had R peroneal DVT.  Clinical Impression  Patient seen for limited evaluation due to medical issues including tachycardia, watery diarrhea and emesis in the bed.  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  Patient had been to [facility] after his stroke in April and had evidently been home limited time.  He may benefit from acute inpatient rehab to maximize safety and independence with mobility prior to d/c back home with family if 24 hour assist remains available.  PT to further assess at next session if pt more stable and able to tolerate OOB.      Recommendations for follow up therapy are one component of a multi-disciplinary discharge planning process, led by the attending physician.  Recommendations may be updated based on patient status, additional functional criteria and insurance authorization.  Follow Up Recommendations Acute inpatient rehab (3hours/day)      Assistance Recommended at Discharge Frequent or constant Supervision/Assistance  Patient can return home with the following  Assistance with cooking/housework;Assist for transportation;A lot of help with bathing/dressing/bathroom;A lot of help with walking and/or transfers;Help with stairs or ramp for entrance    Equipment Recommendations None recommended by PT  Recommendations for Other Services       Functional Status Assessment Patient has had a recent decline in their functional status and/or demonstrates limited ability to make  significant improvements in function in a reasonable and predictable amount of time     Precautions / Restrictions Precautions Precautions: Fall Precaution Comments: L hemiplegia      Mobility  Bed Mobility Overal bed mobility: Needs Assistance Bed Mobility: Rolling Rolling: Mod assist, +2 for physical assistance         General bed mobility comments: rolling for hygiene in the bed due to watery stool and to change linens as pt had emesis in the bed as well    Transfers                   General transfer comment: NT, HR elevation and frequent watery stools    Ambulation/Gait                  Stairs            Wheelchair Mobility    Modified Rankin (Stroke Patients Only)       Balance                                             Pertinent Vitals/Pain Pain Assessment Pain Assessment: Faces Faces Pain Scale: Hurts little more Pain Location: buttocks during hygiene with frequent watery stools Pain Descriptors / Indicators: Grimacing Pain Intervention(s): Monitored during session    Home Living Family/patient expects to be discharged to:: Private residence Living Arrangements: Spouse/significant other Available Help at Discharge: Family;Available 24 hours/day Type of Home: House Home Access: Ramped entrance       Home Layout: Able to live on main level with bedroom/bathroom;Two level Home Equipment:  Wheelchair - manual Additional Comments: reclining wheelchair in the room    Prior Function Prior Level of Function : Needs assist             Mobility Comments: reports was at rehab at [facility]; recently home and wife assisting with transfers to wheelchair and pt wearing briefs due to incontinence; daughter also assisting when wife at work, but had recently applied for and starting to interview for an aide       Hand Dominance   Dominant Hand: Right    Extremity/Trunk Assessment   Upper Extremity  Assessment Upper Extremity Assessment: LUE deficits/detail LUE Deficits / Details: noted L UE extended and with limited movement by his side, but not formally tested    Lower Extremity Assessment Lower Extremity Assessment: LLE deficits/detail LLE Deficits / Details: not lifting to command when in supine, not formally tested due to medical issues       Communication   Communication: No difficulties  Cognition Arousal/Alertness: Awake/alert Behavior During Therapy: WFL for tasks assessed/performed Overall Cognitive Status: Difficult to assess                                 General Comments: not formally tested, pt with HR 140's upon my entry and noted soiled, RN in to assist with hygiene and for delivering meds and pt also had emesis        General Comments      Exercises     Assessment/Plan    PT Assessment Patient needs continued PT services  PT Problem List Decreased strength;Decreased activity tolerance;Decreased mobility;Cardiopulmonary status limiting activity;Decreased safety awareness       PT Treatment Interventions DME instruction;Therapeutic exercise;Wheelchair mobility training;Balance training;Gait training;Functional mobility training;Cognitive remediation;Therapeutic activities;Patient/family education    PT Goals (Current goals can be found in the Care Plan section)  Acute Rehab PT Goals Patient Stated Goal: to go to rehab PT Goal Formulation: With patient Time For Goal Achievement: 02/24/22 Potential to Achieve Goals: Fair    Frequency Min 3X/week     Co-evaluation               AM-PAC PT "6 Clicks" Mobility  Outcome Measure Help needed turning from your back to your side while in a flat bed without using bedrails?: A Lot Help needed moving from lying on your back to sitting on the side of a flat bed without using bedrails?: Total Help needed moving to and from a bed to a chair (including a wheelchair)?: Total Help needed  standing up from a chair using your arms (e.g., wheelchair or bedside chair)?: Total Help needed to walk in hospital room?: Total Help needed climbing 3-5 steps with a railing? : Total 6 Click Score: 7    End of Session   Activity Tolerance: Treatment limited secondary to medical complications (Comment) Patient left: in bed;with nursing/sitter in room;with call bell/phone within reach   PT Visit Diagnosis: Other abnormalities of gait and mobility (R26.89);Muscle weakness (generalized) (M62.81)    Time: 1557-1610 PT Time Calculation (min) (ACUTE ONLY): 13 min   Charges:   PT Evaluation $PT Eval Moderate Complexity: 1 Mod          Cyndi Irelyn Perfecto, PT Acute Rehabilitation Services [phone] 02/10/2022   Reginia Naas 02/10/2022, 5:03 PM

## 2022-02-10 NOTE — Progress Notes (Signed)
Inpatient Rehab Admissions Coordinator:   CIR consult received. Await therapy assessments in order to make an assessment of candidacy.  Clemens Catholic, [place], [place] Admissions Coordinator  [phone] ([place]) [phone] (office)

## 2022-02-11 LAB — GLUCOSE, CAPILLARY
Glucose-Capillary: 156 mg/dL — ABNORMAL HIGH (ref 70–99)
Glucose-Capillary: 168 mg/dL — ABNORMAL HIGH (ref 70–99)
Glucose-Capillary: 182 mg/dL — ABNORMAL HIGH (ref 70–99)
Glucose-Capillary: 151 mg/dL — ABNORMAL HIGH (ref 70–99)

## 2022-02-11 LAB — C DIFFICILE QUICK SCREEN W PCR REFLEX
C Diff antigen: POSITIVE — AB
C Diff interpretation: DETECTED
C Diff toxin: POSITIVE — AB

## 2022-02-11 MED ORDER — ESCITALOPRAM OXALATE 10 MG PO TABS
20.0000 mg | ORAL_TABLET | Freq: Every day | ORAL | Status: DC
  Administered 2022-02-11 – 2022-02-14 (×4): 20 mg via ORAL
  Filled 2022-02-11 (×4): qty 2

## 2022-02-11 MED ORDER — CLONIDINE HCL 0.3 MG/24HR TD PTWK
0.3000 mg | MEDICATED_PATCH | TRANSDERMAL | Status: DC
  Administered 2022-02-11: 0.3 mg via TRANSDERMAL
  Filled 2022-02-11: qty 1

## 2022-02-11 MED ORDER — LISINOPRIL 20 MG PO TABS
40.0000 mg | ORAL_TABLET | Freq: Every day | ORAL | Status: DC
  Administered 2022-02-12 – 2022-02-14 (×3): 40 mg via ORAL
  Filled 2022-02-11 (×4): qty 2

## 2022-02-11 MED ORDER — AMLODIPINE BESYLATE 10 MG PO TABS
10.0000 mg | ORAL_TABLET | Freq: Every day | ORAL | Status: DC
  Administered 2022-02-11 – 2022-02-14 (×4): 10 mg via ORAL
  Filled 2022-02-11 (×4): qty 1

## 2022-02-11 MED ORDER — METOPROLOL TARTRATE 25 MG PO TABS
25.0000 mg | ORAL_TABLET | Freq: Two times a day (BID) | ORAL | Status: DC
  Administered 2022-02-11 – 2022-02-14 (×7): 25 mg via ORAL
  Filled 2022-02-11 (×7): qty 1

## 2022-02-11 MED ORDER — VANCOMYCIN 50 MG/ML ORAL SOLUTION
125.0000 mg | Freq: Four times a day (QID) | ORAL | Status: DC
  Administered 2022-02-11 – 2022-02-12 (×4): 125 mg
  Filled 2022-02-11 (×6): qty 2.5

## 2022-02-11 NOTE — Evaluation (Signed)
Occupational Therapy Evaluation Patient Details Name: Jacob [name] MRN: [id number] DOB: April 03, 1976 Today's Date: 02/11/2022   History of Present Illness 46 y.o. male admitted for cranioplasty performed 9/18 due to sunken craniectomy flap.  Previously underwent R frontotemporal parietal hemicraniectomy on 4/24 after being admitted with R MCA/PCA infarct on 09/12/21.   PMH includes HTN and DM, also had R peroneal DVT.   Clinical Impression   Pt was evaluated s/p the above admission list, he required assist from family since his recent d/c from SNF to home. Upon evaluation pt had functional limitations due to L hemibody sensory motor impairments, L digit contractures, L shoulder sublux, impaired cognition, poor balance, loose/watery stools with stomach pain and decreased activity tolerance. Pt was orthostatic after toileting this session. Overall he required mod A+2 for bed mobility, min A for static standing and mod A +2 for pivot transfers. Due to deficits he requires mod-max A +2 for all ADLs. OT to continue to follow. Recommend d/c to [facility] fro maximal functional progress.   BP in recliner after toileting: 85/58 (66) asymptomatic.  BP after 2 minutes 88/64 (64)    Recommendations for follow up therapy are one component of a multi-disciplinary discharge planning process, led by the attending physician.  Recommendations may be updated based on patient status, additional functional criteria and insurance authorization.   Follow Up Recommendations  Acute inpatient rehab (3hours/day)    Assistance Recommended at Discharge Frequent or constant Supervision/Assistance  Patient can return home with the following A lot of help with walking and/or transfers;A lot of help with bathing/dressing/bathroom;Assistance with cooking/housework;Assistance with feeding;Direct supervision/assist for medications management;Direct supervision/assist for financial management;Help with stairs or ramp for entrance;Assist  for transportation    Functional Status Assessment  Patient has had a recent decline in their functional status and demonstrates the ability to make significant improvements in function in a reasonable and predictable amount of time.  Equipment Recommendations  Other (comment) (defer to next venue)    Recommendations for Other Services Rehab consult     Precautions / Restrictions Precautions Precautions: Fall Precaution Comments: L hemiplegia Restrictions Weight Bearing Restrictions: No      Mobility Bed Mobility Overal bed mobility: Needs Assistance Bed Mobility: Supine to Sit     Supine to sit: Mod assist, +2 for safety/equipment, HOB elevated     General bed mobility comments: increased time to initiate with multimodal cues and assist for L LE, to lift trunk and help scoot hips    Transfers Overall transfer level: Needs assistance Equipment used: 1 person hand held assist Transfers: Sit to/from Stand, Bed to chair/wheelchair/BSC Sit to Stand: Min assist, Mod assist Stand pivot transfers: Mod assist, +2 safety/equipment         General transfer comment: up to stand initially from EOB at elevated height with min A, then pivot to [facility] with mod A +2 for safety with lines and managing cords; to recliner mod A sit to stand from toilet and pt remaining flexed and leaning on PT progressing to max A for balance while OT moved BSC and placed recliner      Balance Overall balance assessment: Needs assistance   Sitting balance-Leahy Scale: Poor Sitting balance - Comments: 1 UE support and CGA to min A   Standing balance support: Single extremity supported Standing balance-Leahy Scale: Poor Standing balance comment: min A initially for balance prior to transfer to [facility], but more mod to max A for standing during toilet hygiene with OT assisting and when transitioning  to recliner                           ADL either performed or assessed with clinical judgement    ADL Overall ADL's : Needs assistance/impaired Eating/Feeding: Set up;Sitting   Grooming: Set up;Min guard;Sitting   Upper Body Bathing: Moderate assistance;Sitting   Lower Body Bathing: Maximal assistance;Sit to/from stand;+2 for physical assistance;+2 for safety/equipment   Upper Body Dressing : Sitting;Moderate assistance   Lower Body Dressing: Maximal assistance;Sit to/from stand   Toilet Transfer: Moderate assistance;BSC/3in1   Toileting- Clothing Manipulation and Hygiene: Maximal assistance;+2 for physical assistance       Functional mobility during ADLs: Moderate assistance General ADL Comments: limited by L hemi, impaired cog, decreased activity tolernace     Vision Ability to See in Adequate Light: 1 Impaired Patient Visual Report: Diplopia Vision Assessment?: Vision impaired- to be further tested in functional context Additional Comments: needs to be further assessed, pt reported DV however unable to get a great understanding of impairment.     Perception     Praxis      Pertinent Vitals/Pain Pain Assessment Pain Assessment: Faces Pain Score: 7  Pain Location: abdomen Pain Descriptors / Indicators: Cramping Pain Intervention(s): Limited activity within patient's tolerance, Monitored during session     Hand Dominance Right   Extremity/Trunk Assessment Upper Extremity Assessment Upper Extremity Assessment: LUE deficits/detail LUE Deficits / Details: contracted into flexion at 2nd-5th digits. wrist and elbow PROM is WFL. sublux at elbow. no activation noted. LUE Sensation: decreased light touch LUE Coordination: decreased fine motor;decreased gross motor   Lower Extremity Assessment Lower Extremity Assessment: Defer to PT evaluation   Cervical / Trunk Assessment Cervical / Trunk Assessment: Kyphotic Cervical / Trunk Exceptions: foreward head   Communication Communication Communication: No difficulties (slurred adn deliberate speech)   Cognition  Arousal/Alertness: Awake/alert Behavior During Therapy: WFL for tasks assessed/performed Overall Cognitive Status: Impaired/Different from baseline Area of Impairment: Attention, Following commands, Problem solving                   Current Attention Level: Sustained   Following Commands: Follows one step commands inconsistently, Follows one step commands with increased time     Problem Solving: Decreased initiation, Slow processing General Comments: required significantly increased time for processing and multiple cues. Follow one step directions well, difficulty with 2+ steps     General Comments  BP decreasted after activity, slowed recovered once reclined in the chair    Exercises     Shoulder Instructions      Home Living Family/patient expects to be discharged to:: Private residence Living Arrangements: Spouse/significant other;Children Available Help at Discharge: Family;Available 24 hours/day Type of Home: House Home Access: Ramped entrance     Home Layout: Able to live on main level with bedroom/bathroom;Multi-level Alternate Level Stairs-Number of Steps: full flight Alternate Level Stairs-Rails: Left Bathroom Shower/Tub: Occupational psychologist: Standard Bathroom Accessibility: Yes How Accessible: Accessible via wheelchair;Accessible via walker Home Equipment: Dixon (2 wheels);Wheelchair - manual;Hospital bed;Other (comment)   Additional Comments: reclining wheelchair in the room  Lives With: Spouse;Family    Prior Functioning/Environment Prior Level of Function : Needs assist             Mobility Comments: reports was at rehab at [facility]; recently home and wife assisting with transfers to wheelchair and pt wearing briefs due to incontinence; daughter also assisting when wife at work, but had  recently applied for and starting to interview for an aide ADLs Comments: mod-total A for ADLs at bed or WC level. wife and  daughter assissting.        OT Problem List: Decreased strength;Decreased range of motion;Decreased activity tolerance;Impaired balance (sitting and/or standing);Decreased cognition;Decreased safety awareness;Decreased knowledge of use of DME or AE;Decreased knowledge of precautions;Pain;Impaired UE functional use      OT Treatment/Interventions: Self-care/ADL training;Therapeutic exercise;DME and/or AE instruction;Therapeutic activities;Balance training;Patient/family education    OT Goals(Current goals can be found in the care plan section) Acute Rehab OT Goals Patient Stated Goal: to feel better OT Goal Formulation: With patient Time For Goal Achievement: 02/25/22 Potential to Achieve Goals: Good ADL Goals Pt Will Perform Grooming: with modified independence;sitting Pt Will Perform Upper Body Dressing: with modified independence;sitting Pt Will Perform Lower Body Dressing: with min assist;sit to/from stand Pt Will Transfer to Toilet: with min assist;stand pivot transfer;bedside commode Additional ADL Goal #1: Pt will use LUE as a functional assist during ADL with min A  OT Frequency: Min 2X/week    Co-evaluation PT/OT/SLP Co-Evaluation/Treatment: Yes Reason for Co-Treatment: Complexity of the patient's impairments (multi-system involvement);For patient/therapist safety;To address functional/ADL transfers PT goals addressed during session: Mobility/safety with mobility;Balance OT goals addressed during session: ADL's and self-care      AM-PAC OT "6 Clicks" Daily Activity     Outcome Measure Help from another person eating meals?: A Little Help from another person taking care of personal grooming?: A Little Help from another person toileting, which includes using toliet, bedpan, or urinal?: A Lot Help from another person bathing (including washing, rinsing, drying)?: A Lot Help from another person to put on and taking off regular upper body clothing?: A Lot Help from another  person to put on and taking off regular lower body clothing?: A Lot 6 Click Score: 14   End of Session Equipment Utilized During Treatment: Gait belt Nurse Communication: Mobility status  Activity Tolerance: Patient tolerated treatment well Patient left: in chair;with call bell/phone within reach;with chair alarm set;with family/visitor present  OT Visit Diagnosis: Unsteadiness on feet (R26.81);Other abnormalities of gait and mobility (R26.89);Muscle weakness (generalized) (M62.81);Hemiplegia and hemiparesis Hemiplegia - Right/Left: Left Hemiplegia - dominant/non-dominant: Non-Dominant Hemiplegia - caused by: Cerebral infarction                Time: 1025-1110 OT Time Calculation (min): 45 min Charges:  OT General Charges $OT Visit: 1 Visit OT Evaluation $OT Eval Moderate Complexity: 1 Mod    Greyson Riccardi D Causey 02/11/2022, 3:44 PM

## 2022-02-11 NOTE — Progress Notes (Signed)
Physical Therapy Treatment Patient Details Name: Jacob [name] MRN: [id number] DOB: 25-Nov-1975 Today's Date: 02/11/2022   History of Present Illness 46 y.o. male admitted for cranioplasty performed 9/18 due to sunken craniectomy flap.  Previously underwent R frontotemporal parietal hemicraniectomy on 4/24 after being admitted with R MCA/PCA infarct on 09/12/21.   PMH includes HTN and DM, also had R peroneal DVT.    PT Comments    Patient seen with OT for OOB assessment today. Patient with decreased initiation needing time, multimodal cues and assist to initiate up to EOB.  Not able to move L LE or L UE at this time and developing L hand contracture and shoulder subluxation.  Patient able to stand, however from EOB with min A initially (wife reporting he was able to stand almost on his own at home, but needed support for pivot to chair.)  He continues with watery stools and was weaker with second stand needing mod A and up to max A for support while OT placed recliner behind him.  Found to be hypotensive sitting in recliner with BP 85/58 (MAP 66) so reclined and lowered his head and follow up BP 88/64 (MAP 72) with RN aware and pt conversant but fatigued.  Reported head feeling heavy as well with noted continued swelling on R side from skull flap replacement.  Continue to feel patient will benefit from acute inpatient rehab prior to d/c home with family support.    Recommendations for follow up therapy are one component of a multi-disciplinary discharge planning process, led by the attending physician.  Recommendations may be updated based on patient status, additional functional criteria and insurance authorization.  Follow Up Recommendations  Acute inpatient rehab (3hours/day)     Assistance Recommended at Discharge Frequent or constant Supervision/Assistance  Patient can return home with the following Assistance with cooking/housework;Assist for transportation;A lot of help with  bathing/dressing/bathroom;A lot of help with walking and/or transfers;Help with stairs or ramp for entrance   Equipment Recommendations  None recommended by PT    Recommendations for Other Services       Precautions / Restrictions Precautions Precautions: Fall Precaution Comments: L hemiplegia     Mobility  Bed Mobility Overal bed mobility: Needs Assistance Bed Mobility: Supine to Sit     Supine to sit: Mod assist, +2 for safety/equipment, HOB elevated     General bed mobility comments: increased time to initiate with multimodal cues and assist for L LE, to lift trunk and help scoot hips    Transfers Overall transfer level: Needs assistance Equipment used: 1 person hand held assist Transfers: Sit to/from Stand, Bed to chair/wheelchair/BSC Sit to Stand: Min assist, Mod assist Stand pivot transfers: Mod assist, +2 safety/equipment         General transfer comment: up to stand initially from EOB at elevated height with min A, then pivot to [facility] with mod A +2 for safety with lines and managing cords; to recliner mod A sit to stand from toilet and pt remaining flexed and leaning on PT progressing to max A for balance while OT moved BSC and placed recliner    Ambulation/Gait                   Stairs             Wheelchair Mobility    Modified Rankin (Stroke Patients Only)       Balance Overall balance assessment: Needs assistance   Sitting balance-Leahy Scale: Poor Sitting balance - Comments:  1 UE support and CGA to min A   Standing balance support: Single extremity supported Standing balance-Leahy Scale: Poor Standing balance comment: min A initially for balance prior to transfer to [facility], but more mod to max A for standing during toilet hygiene with OT assisting and when transitioning to recliner                            Cognition Arousal/Alertness: Awake/alert Behavior During Therapy: WFL for tasks assessed/performed Overall  Cognitive Status: Impaired/Different from baseline Area of Impairment: Attention, Following commands, Problem solving                   Current Attention Level: Sustained   Following Commands: Follows one step commands inconsistently, Follows one step commands with increased time     Problem Solving: Decreased initiation, Slow processing General Comments: wife present today and reports pt more weak and slower to respond than usual        Exercises      General Comments General comments (skin integrity, edema, etc.): wife present to give history and hopeful for acute inpatient rehab prior to home      Pertinent Vitals/Pain Pain Assessment Pain Score: 7  Pain Location: abdomen Pain Descriptors / Indicators: Cramping Pain Intervention(s): Monitored during session    Home Living Family/patient expects to be discharged to:: Private residence Living Arrangements: Spouse/significant other;Children Available Help at Discharge: Family;Available 24 hours/day Type of Home: House Home Access: Ramped entrance     Alternate Level Stairs-Number of Steps: full flight Home Layout: Able to live on main level with bedroom/bathroom;Multi-level Home Equipment: Rolling Walker (2 wheels);Wheelchair - manual;Hospital bed;Other (comment) (hand splint) Additional Comments: reclining wheelchair in the room    Prior Function            PT Goals (current goals can now be found in the care plan section) Progress towards PT goals: Progressing toward goals    Frequency    Min 3X/week      PT Plan Current plan remains appropriate    Co-evaluation PT/OT/SLP Co-Evaluation/Treatment: Yes Reason for Co-Treatment: For patient/therapist safety;To address functional/ADL transfers PT goals addressed during session: Mobility/safety with mobility;Balance        AM-PAC PT "6 Clicks" Mobility   Outcome Measure  Help needed turning from your back to your side while in a flat bed without  using bedrails?: A Lot Help needed moving from lying on your back to sitting on the side of a flat bed without using bedrails?: Total Help needed moving to and from a bed to a chair (including a wheelchair)?: A Lot Help needed standing up from a chair using your arms (e.g., wheelchair or bedside chair)?: A Lot Help needed to walk in hospital room?: Total Help needed climbing 3-5 steps with a railing? : Total 6 Click Score: 9    End of Session Equipment Utilized During Treatment: Gait belt Activity Tolerance: Patient limited by fatigue;Treatment limited secondary to medical complications (Comment) Patient left: in chair;with call bell/phone within reach;with chair alarm set;with family/visitor present Nurse Communication: Other (comment);Mobility status (hypotension) PT Visit Diagnosis: Other abnormalities of gait and mobility (R26.89);Muscle weakness (generalized) (M62.81)     Time: [id number] PT Time Calculation (min) (ACUTE ONLY): 43 min  Charges:  $Therapeutic Activity: 8-22 mins                     Magda Kiel, PT Acute Rehabilitation Services [phone] 02/11/2022  Reginia Naas 02/11/2022, 12:00 PM

## 2022-02-11 NOTE — PMR Pre-admission (Signed)
PMR Admission Coordinator Pre-Admission Assessment  Patient: Jacob [name] is an 46 y.o., male MRN: [id number] DOB: 09/16/1975 Height: 6' 1"  (185.4 cm) Weight: 81.6 kg  Insurance Information HMO:     PPO:      PCP:      IPA:      80/20:      OTHER:  PRIMARY: Pharmacologist (to tern 4/09)     Policy#: [id number]      Subscriber: patient CM Name: ***      Phone#: ***     Fax#: [phone] Pre-Cert#: [id number]  Received approval 02/13/22. Pt approved for 7 days    Employer: *** Benefits:  Phone #: [phone]     Name:  Eff. Date: 05/25/20     Deduct: $1500 (met $15000      Out of Pocket Max: $4000 (met $1500)      Life Max: N/A CIR: 85%      SNF: 85% with 60 days max/year Outpatient: 85%     Co-Pay: 15% Home Health: 85%      Co-Pay: 15% DME: 85%     Co-Pay: 15% Providers: in network  SECONDARY: [employer]     (Will be primary 66/4)  Policy#: [id number]     Phone#: [phone]  Financial Counselor:       Phone#:   The "Data Collection Information Summary" for patients in Inpatient Rehabilitation Facilities with attached "Privacy Act [place] Records" was provided and verbally reviewed with:   Emergency Contact Information Contact Information     Name Relation Home Work Mobile   [place] Spouse   [phone]   Claudean Kinds [phone]         Current Medical History  Patient Admitting Diagnosis: S/p cranioplasty History of Present Illness: Patient is a 46 year old male with PMH of HTN and DM, also had R peroneal DVT on whom neurosurgery performed a right decompressive craniectomy on 09/15/2021 after a cerebrovascular accident. Pt. Previously admitted with R MCA/PCA infarct on 09/12/21 The patient has recovered from his stroke, with [facility] and then SNF placement. Discharged home from SNF 2 weeks ago.  He is left hemiplegic.  His craniectomy flap is sunken and the patient and his wife have requested to have it replaced. Pt. Underwent  cranioplasty 9/18 with Dr. Arnoldo Morale.Admitted to ICU following and PT/OT saw Pt. With recommendations for CIR to assist return to PLOF.     Patient's medical record from [facility] has been reviewed by the rehabilitation admission coordinator and physician.  Past Medical History  Past Medical History:  Diagnosis Date   Allergy    Diabetes mellitus without complication ([place])    GERD (gastroesophageal reflux disease)    Hypertension    Paralysis ([place])    Stroke [facility])     Has the patient had major surgery during 100 days prior to admission? Yes  Family History   family history includes Asthma in his son.  Current Medications  Current Facility-Administered Medications:    0.9 % NaCl with KCl 20 mEq/ L  infusion, , Intravenous, Continuous, Newman Pies, MD, Last Rate: 75 mL/hr at 02/10/22 1657, Infusion Verify at 02/10/22 1657   acetaminophen (TYLENOL) tablet 650 mg, 650 mg, Oral, Q4H PRN **OR** acetaminophen (TYLENOL) suppository 650 mg, 650 mg, Rectal, Q4H PRN, Newman Pies, MD   amLODipine (NORVASC) tablet 10 mg, 10 mg, Oral, Daily, Newman Pies, MD, 10 mg at 02/11/22 0946   Chlorhexidine Gluconate Cloth 2 % PADS 6 each, 6 each, Topical, Q0600,  Newman Pies, MD, 6 each at 02/11/22 0401   cloNIDine (CATAPRES - Dosed in mg/24 hr) patch 0.3 mg, 0.3 mg, Transdermal, Weekly, Newman Pies, MD   docusate sodium (COLACE) capsule 100 mg, 100 mg, Oral, BID, Newman Pies, MD   escitalopram (LEXAPRO) tablet 20 mg, 20 mg, Oral, Daily, Newman Pies, MD, 20 mg at 02/11/22 0945   hydrALAZINE (APRESOLINE) tablet 5-20 mg, 5-20 mg, Oral, Q4H PRN, Fenton Malling L, NP, 20 mg at 02/11/22 0355   HYDROcodone-acetaminophen (NORCO/VICODIN) 5-325 MG per tablet 1 tablet, 1 tablet, Oral, Q4H PRN, Newman Pies, MD, 1 tablet at 02/10/22 1651   insulin aspart (novoLOG) injection 0-20 Units, 0-20 Units, Subcutaneous, TID WC, Bergman, Meghan D, NP, 4 Units at 02/11/22  0944   labetalol (NORMODYNE) injection 10-40 mg, 10-40 mg, Intravenous, Q10 min PRN, Newman Pies, MD, 40 mg at 02/10/22 2320   lisinopril (ZESTRIL) tablet 40 mg, 40 mg, Oral, Daily, Newman Pies, MD   metoprolol tartrate (LOPRESSOR) tablet 25 mg, 25 mg, Oral, BID, Newman Pies, MD, 25 mg at 02/11/22 0945   mupirocin ointment (BACTROBAN) 2 % 1 Application, 1 Application, Nasal, BID, Newman Pies, MD, 1 Application at 94/58/59 0945   ondansetron (ZOFRAN) tablet 4 mg, 4 mg, Oral, Q4H PRN **OR** ondansetron (ZOFRAN) injection 4 mg, 4 mg, Intravenous, Q4H PRN, Newman Pies, MD, 4 mg at 02/10/22 1657   oxyCODONE-acetaminophen (PERCOCET/ROXICET) 5-325 MG per tablet 1 tablet, 1 tablet, Oral, Q4H PRN, Newman Pies, MD, 1 tablet at 02/11/22 0356   promethazine (PHENERGAN) tablet 12.5-25 mg, 12.5-25 mg, Oral, Q4H PRN, Newman Pies, MD  Patients Current Diet:  Diet Order             Diet Carb Modified Fluid consistency: Thin; Room service appropriate? Yes  Diet effective now                   Precautions / Restrictions Precautions Precautions: Fall Precaution Comments: L hemiplegia Restrictions Weight Bearing Restrictions: No   Has the patient had 2 or more falls or a fall with injury in the past year? No  Prior Activity Level Household: Pt went out for appts  Prior Functional Level Self Care: Did the patient need help bathing, dressing, using the toilet or eating? Independent  Indoor Mobility: Did the patient need assistance with walking from room to room (with or without device)? Independent  Stairs: Did the patient need assistance with internal or external stairs (with or without device)? Independent  Functional Cognition: Did the patient need help planning regular tasks such as shopping or remembering to take medications? Independent  Patient Information Are you of Hispanic, Latino/a,or Spanish origin?: A. No, not of Hispanic, Latino/a, or Spanish  origin (answers from proxy) What is your race?: B. Black or African American Do you need or want an interpreter to communicate with a doctor or health care staff?: 1. Yes  Patient's Response To:  Health Literacy and Transportation Is the patient able to respond to health literacy and transportation needs?: No Health Literacy - How often do you need to have someone help you when you read instructions, pamphlets, or other written material from your doctor or pharmacy?: Patient unable to respond In the past 12 months, has lack of transportation kept you from medical appointments or from getting medications?: Yes (Answers from proxy) In the past 12 months, has lack of transportation kept you from meetings, work, or from getting things needed for daily living?: Yes  Home Assistive Devices / Equipment Home Equipment:  Wheelchair - manual  Prior Device Use: Indicate devices/aids used by the patient prior to current illness, exacerbation or injury? Manual wheelchair  Current Functional Level Cognition  Overall Cognitive Status: Difficult to assess Orientation Level: Oriented to person, Oriented to place, Oriented to time General Comments: not formally tested, pt with HR 140's upon my entry and noted soiled, RN in to assist with hygiene and for delivering meds and pt also had emesis    Extremity Assessment (includes Sensation/Coordination)  Upper Extremity Assessment: LUE deficits/detail LUE Deficits / Details: noted L UE extended and with limited movement by his side, but not formally tested  Lower Extremity Assessment: LLE deficits/detail LLE Deficits / Details: not lifting to command when in supine, not formally tested due to medical issues    ADLs       Mobility  Overal bed mobility: Needs Assistance Bed Mobility: Rolling Rolling: Mod assist, +2 for physical assistance General bed mobility comments: rolling for hygiene in the bed due to watery stool and to change linens as pt had emesis  in the bed as well    Transfers  General transfer comment: NT, HR elevation and frequent watery stools    Ambulation / Gait / Stairs / Office manager / Balance      Special needs/care consideration Skin *** and Special service needs ***   Previous Home Environment (from acute therapy documentation) Living Arrangements: Spouse/significant other  Lives With: Spouse, Family Available Help at Discharge: Family, Available 24 hours/day Type of Home: House Home Layout: Able to live on main level with bedroom/bathroom, Multi-level Alternate Level Stairs-Rails: Left Alternate Level Stairs-Number of Steps: full flight Home Access: Ramped entrance Bathroom Shower/Tub: Multimedia programmer: Standard Bathroom Accessibility: Yes How Accessible: Accessible via wheelchair, Accessible via walker [place]: Yes Type of Home Care Services: Home PT, Home OT Additional Comments: reclining wheelchair in the room  Discharge Living Setting Plans for Discharge Living Setting: Patient's home Type of Home at Discharge: House Discharge Home Layout: Multi-level, Able to live on main level with bedroom/bathroom Alternate Level Stairs-Rails: Right, Left Alternate Level Stairs-Number of Steps: fullflight Discharge Home Access: [place] entrance Discharge Bathroom Shower/Tub: Walk-in shower Discharge Bathroom Toilet: Handicapped height Discharge Bathroom Accessibility: Yes How Accessible: Accessible via walker, Accessible via wheelchair  [place] Patient Roles: Spouse Contact Information: [phone] Anticipated Caregiver: [place] Ability/Limitations of Caregiver: Min A Caregiver Availability: 24/7 Discharge Plan Discussed with Primary Caregiver: Yes Is Caregiver In Agreement with Plan?: No Does Caregiver/Family have Issues with Lodging/Transportation while Pt is in Rehab?: No  Goals Patient/Family Goal for Rehab: PT/OT/SLP Mod  A Expected length of stay: 12-14 days Pt/Family Agrees to Admission and willing to participate: Yes Program Orientation Provided & Reviewed with Pt/Caregiver Including Roles  & Responsibilities: Yes  Decrease burden of Care through IP rehab admission: Not anticipated  Possible need for SNF placement upon discharge: None  Patient Condition: I have reviewed medical records from [facility], spoken with CM, and patient. I met with patient at the bedside for inpatient rehabilitation assessment.  Patient will benefit from ongoing PT and OT, can actively participate in 3 hours of therapy a day 5 days of the week, and can make measurable gains during the admission.  Patient will also benefit from the coordinated team approach during an Inpatient Acute Rehabilitation admission.  The patient will receive intensive therapy as well as Rehabilitation physician, nursing, social worker, and care management interventions.  Due to  safety, skin/wound care, disease management, medication administration, pain management, and patient education the patient requires 24 hour a day rehabilitation nursing.  The patient is currently *** with mobility and basic ADLs.  Discharge setting and therapy post discharge at home with home health is anticipated.  Patient has agreed to participate in the Acute Inpatient Rehabilitation Program and will admit {Time; today/tomorrow:10263}.  Preadmission Screen Completed By:  Genella Mech, 02/11/2022 10:14 AM and updated by Karene Fry on 02/12/22 at 74 ______________________________________________________________________   Discussed status with Dr. Marland Kitchen on *** at *** and received approval for admission today.  Admission Coordinator:  Genella Mech, CCC-SLP, time Marland KitchenSudie Grumbling ***   Assessment/Plan: Diagnosis: Does the need for close, 24 hr/day Medical supervision in concert with the patient's rehab needs make it unreasonable for this patient to be served in a less  intensive setting? {yes_no_potentially:3041433} Co-Morbidities requiring supervision/potential complications: *** Due to {due VG:8628241}, does the patient require 24 hr/day rehab nursing? {yes_no_potentially:3041433} Does the patient require coordinated care of a physician, rehab nurse, PT, OT, and SLP to address physical and functional deficits in the context of the above medical diagnosis(es)? {yes_no_potentially:3041433} Addressing deficits in the following areas: {deficits:3041436} Can the patient actively participate in an intensive therapy program of at least 3 hrs of therapy 5 days a week? {yes_no_potentially:3041433} The potential for patient to make measurable gains while on inpatient rehab is {potential:3041437} Anticipated functional outcomes upon discharge from inpatient rehab: {functional outcomes:304600100} PT, {functional outcomes:304600100} OT, {functional outcomes:304600100} SLP Estimated rehab length of stay to reach the above functional goals is: *** Anticipated discharge destination: {anticipated dc setting:21604} 10. Overall Rehab/Functional Prognosis: {potential:3041437}   MD Signature: ***

## 2022-02-11 NOTE — Progress Notes (Signed)
Subjective: The patient is somnolent but easily arousable.  His wife is at the bedside.  He has had some diarrhea.  Objective: Vital signs in last 24 hours: Temp:  [98.6 F (37 C)-99.5 F (37.5 C)] 98.6 F (37 C) (09/20 0728) Pulse Rate:  [95-113] 95 (09/20 0728) Resp:  [14-20] 14 (09/20 0728) BP: (138-179)/(85-111) 138/85 (09/20 0728) SpO2:  [98 %-100 %] 99 % (09/20 0728) Estimated body mass index is 23.75 kg/m as calculated from the following:   Height as of this encounter: 6\' 1"  (1.854 m).   Weight as of this encounter: 81.6 kg.   Intake/Output from previous day: 09/19 0701 - 09/20 0700 In: 862.7 [I.V.:762.7; IV Piggyback:100] Out: -  Intake/Output this shift: No intake/output data recorded.  Physical exam the patient is somnolent but easily arousable.  His dressing is clean and dry.  He remains left hemiplegic.  Lab Results: No results for input(s): "WBC", "HGB", "HCT", "PLT" in the last 72 hours. BMET No results for input(s): "NA", "K", "CL", "CO2", "GLUCOSE", "BUN", "CREATININE", "CALCIUM" in the last 72 hours.  Studies/Results: No results found.  Assessment/Plan: Stop day #2, status post cranioplasty: We are awaiting rehab placement.  Hypertension: I will resume his home medications  Diarrhea: We will check for C. difficile.  LOS: 2 days     [name] 02/11/2022, 8:59 AM

## 2022-02-11 NOTE — Progress Notes (Signed)
Inpatient Rehab Admissions Coordinator:    I spoke with pt.'s wife regarding potential CIR admit. She states interest and notes functional decline since surgery compared to how he was doing at home. She states she can provide 24/7 support after d/c. Dr. Leeroy Cha , rehab MD, reviewed case and felt that he meets criteria for medical necessity for CIR.  Note orthostasis during PT today, Will follow and open case with insurance once medically stable and able to tolerate CIR.   Clemens Catholic, [place], [place] Admissions Coordinator  [phone] ([place]) [phone] (office)

## 2022-02-12 ENCOUNTER — Inpatient Hospital Stay (HOSPITAL_COMMUNITY): Payer: Medicaid Other

## 2022-02-12 LAB — GLUCOSE, CAPILLARY
Glucose-Capillary: 149 mg/dL — ABNORMAL HIGH (ref 70–99)
Glucose-Capillary: 171 mg/dL — ABNORMAL HIGH (ref 70–99)
Glucose-Capillary: 168 mg/dL — ABNORMAL HIGH (ref 70–99)
Glucose-Capillary: 141 mg/dL — ABNORMAL HIGH (ref 70–99)

## 2022-02-12 MED ORDER — VANCOMYCIN 50 MG/ML ORAL SOLUTION
125.0000 mg | Freq: Four times a day (QID) | ORAL | Status: DC
  Administered 2022-02-12 – 2022-02-14 (×8): 125 mg via ORAL
  Filled 2022-02-12 (×10): qty 2.5

## 2022-02-12 MED ORDER — MELATONIN 3 MG PO TABS
3.0000 mg | ORAL_TABLET | Freq: Every evening | ORAL | Status: DC | PRN
  Administered 2022-02-12: 3 mg via ORAL
  Filled 2022-02-12: qty 1

## 2022-02-12 NOTE — Progress Notes (Signed)
IP rehab admissions - Case is being opened this afternoon with [employer] requesting acute inpatient rehab admission.  Clinicals are being sent now.  I will update all as soon as we have a response from insurance case manager.  Call me for questions.  [phone]

## 2022-02-12 NOTE — Progress Notes (Signed)
Occupational Therapy Treatment Patient Details Name: Jacob [name] MRN: [id number] DOB: 1975/12/11 Today's Date: 02/12/2022   History of present illness 46 y.o. male admitted for cranioplasty performed 9/18 due to sunken craniectomy flap.  Previously underwent R frontotemporal parietal hemicraniectomy on 4/24 after being admitted with R MCA/PCA infarct on 09/12/21.   PMH includes HTN and DM, also had R peroneal DVT.   OT comments  Pt seen to further assess LUE for splinting. 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 Spoke with wife over the phone and Lue apparently has a resting hand splint at home, which wife says does not fit well. Asked wife to bring in splint. Will try to modify splint to use to prevent further development of LUE contracture. Wife hopeful for admission to AIR to maximize functional level of independence to facilitate safe DC home with supportive wife. Will follow.    Recommendations for follow up therapy are one component of a multi-disciplinary discharge planning process, led by the attending physician.  Recommendations may be updated based on patient status, additional functional criteria and insurance authorization.    Follow Up Recommendations  Acute inpatient rehab (3hours/day)    Assistance Recommended at Discharge Frequent or constant Supervision/Assistance  Patient can return home with the following  A lot of help with walking and/or transfers;A lot of help with bathing/dressing/bathroom;Assistance with cooking/housework;Assistance with feeding;Direct supervision/assist for medications management;Direct supervision/assist for financial management;Help with stairs or ramp for entrance;Assist for transportation   Equipment Recommendations       Recommendations for Other Services Rehab consult    Precautions / Restrictions Precautions Precautions: Fall Precaution Comments: L hemiplegia;  painful LUE/shoulder; developing flexor contracture Required Braces or Orthoses: Splint/Cast (wife to bring in splint - spoke with her over the phone 9/21)          Extremity/Trunk Assessment Upper Extremity Assessment LUE Deficits / Details: contracted into flexion at 2nd-5th digits. wrist and elbow PROM is WFL. sublux at elbow. no activation noted.                              Cognition Arousal/Alertness: Awake/alert Behavior During Therapy: WFL for tasks assessed/performed Overall Cognitive Status: Impaired/Different from baseline                                          Exercises Exercises: Other exercises Other Exercises Other Exercises: LUE A/AA/PROM as tolerated    Shoulder Instructions       General Comments      Pertinent Vitals/ Pain       Pain Assessment Pain Assessment: PAINAD Faces Pain Scale: Hurts little more Pain Location: LUE/shoulder Pain Descriptors / Indicators: Grimacing, Guarding Pain Intervention(s): Limited activity within patient's tolerance  Home Living                                          Prior Functioning/Environment              Frequency  Min 2X/week        Progress Toward Goals  OT Goals(current goals can now be found in the care plan section)  Progress towards OT goals: Progressing toward goals  Acute Rehab OT Goals Patient Stated Goal: to go get some rehab OT Goal Formulation: With patient Time For Goal Achievement: 02/25/22 Potential to Achieve Goals: Good ADL Goals Pt Will Perform Grooming: with modified independence;sitting Pt Will Perform Upper Body Dressing: with modified independence;sitting Pt Will Perform Lower Body Dressing: with min assist;sit to/from stand Pt Will Transfer to Toilet: with min assist;stand pivot transfer;bedside commode Additional ADL Goal #1: Pt will use LUE as a functional assist during ADL with min A  Plan Discharge plan remains  appropriate    Co-evaluation                 AM-PAC OT "6 Clicks" Daily Activity     Outcome Measure   Help from another person eating meals?: A Little Help from another person taking care of personal grooming?: A Little Help from another person toileting, which includes using toliet, bedpan, or urinal?: A Lot Help from another person bathing (including washing, rinsing, drying)?: A Lot Help from another person to put on and taking off regular upper body clothing?: A Lot Help from another person to put on and taking off regular lower body clothing?: A Lot 6 Click Score: 14    End of Session    OT Visit Diagnosis: Unsteadiness on feet (R26.81);Other abnormalities of gait and mobility (R26.89);Muscle weakness (generalized) (M62.81);Hemiplegia and hemiparesis;Pain;Other symptoms and signs involving cognitive function Hemiplegia - Right/Left: Left Hemiplegia - dominant/non-dominant: Non-Dominant Hemiplegia - caused by: Cerebral infarction Pain - Right/Left: Left Pain - part of body: Shoulder;Arm   Activity Tolerance Patient tolerated treatment well   Patient Left in bed;with call bell/phone within reach;with bed alarm set   Nurse Communication Mobility status;Other (comment) (splint to be brought in)        Time: 2956-2130 OT Time Calculation (min): 16 min  Charges: OT General Charges $OT Visit: 1 Visit OT Treatments $Therapeutic Activity: 8-22 mins  Maurie Boettcher, OT/L   Acute OT Clinical Specialist [place] Pager [phone] Office [phone]   [facility] 02/12/2022, 4:06 PM

## 2022-02-12 NOTE — TOC Progression Note (Signed)
Transition of Care [facility]) - Progression Note    Patient Details  Name: Jacob [name] MRN: [id number] Date of Birth: 1975/06/11  Transition of Care [facility]) CM/SW [place], RN Phone [phone]  02/12/2022, 4:49 PM  Clinical Narrative:    TOC acknowledges CIR consult and will follow.         Expected Discharge Plan and Services                                                 Social Determinants of Health (SDOH) Interventions    Readmission Risk Interventions     No data to display

## 2022-02-12 NOTE — Progress Notes (Signed)
   Providing Compassionate, Quality Care - Together   Subjective: Patient reports no new symptoms. He and his wife are interested in him going to CIR.   Nurse has concern regarding the change in the patient's stool.  He is only passing mucus now.  Objective: Vital signs in last 24 hours: Temp:  [98 F (36.7 C)-99.9 F (37.7 C)] 99.9 F (37.7 C) (09/21 1119) Pulse Rate:  [98-108] 108 (09/21 1119) Resp:  [11-18] 18 (09/21 1119) BP: (122-150)/(82-97) 150/97 (09/21 1119) SpO2:  [93 %-99 %] 99 % (09/21 1119)  Intake/Output from previous day: 09/20 0701 - 09/21 0700 In: -  Out: 800 [Urine:800] Intake/Output this shift: No intake/output data recorded.  Alert and oriented x 4 PERRLA Left hemiplegia Incisions are closed with staples. Incisions are clean, dry, and intact.   Lab Results: No results for input(s): "WBC", "HGB", "HCT", "PLT" in the last 72 hours. BMET No results for input(s): "NA", "K", "CL", "CO2", "GLUCOSE", "BUN", "CREATININE", "CALCIUM" in the last 72 hours.  Studies/Results: No results found.  Assessment/Plan: Patient underwent cranioplasty by Dr. Arnoldo Morale on 02/09/2022. He developed diarrhea on post op day 1 and was positive for C. Diff. Treatment for C. Diff was started on 02/11/2022. Patient is only passing mucus presently. Abdominal x-ray has been ordered to rule out ileus.   LOS: 3 days   -Continue to mobilize with therapies. Patient and wife would like to discharge to CIR. -Continue PO Vancomycin.    Viona Gilmore, DNP, AGNP-C Nurse Practitioner  [facility] [place] [address], [place], [place] [address] P: [phone]    F: [phone]  02/12/2022, 12:17 PM

## 2022-02-13 LAB — GLUCOSE, CAPILLARY
Glucose-Capillary: 130 mg/dL — ABNORMAL HIGH (ref 70–99)
Glucose-Capillary: 152 mg/dL — ABNORMAL HIGH (ref 70–99)
Glucose-Capillary: 161 mg/dL — ABNORMAL HIGH (ref 70–99)
Glucose-Capillary: 168 mg/dL — ABNORMAL HIGH (ref 70–99)

## 2022-02-13 MED ORDER — INFLUENZA VAC SPLIT QUAD 0.5 ML IM SUSY: 0.5000 mL | PREFILLED_SYRINGE | INTRAMUSCULAR | Status: DC | PRN

## 2022-02-13 MED ORDER — APIXABAN 5 MG PO TABS
5.0000 mg | ORAL_TABLET | Freq: Two times a day (BID) | ORAL | Status: DC
  Administered 2022-02-14: 5 mg via ORAL
  Filled 2022-02-13: qty 1

## 2022-02-13 MED ORDER — PNEUMOCOCCAL 20-VAL CONJ VACC 0.5 ML IM SUSY: 0.5000 mL | PREFILLED_SYRINGE | INTRAMUSCULAR | Status: DC | PRN

## 2022-02-13 NOTE — H&P (Signed)
Physical Medicine and Rehabilitation Admission H&P    CC: Functional deficits secondary to prior CVA with decompressive craniectomy s/p cranioplasty 02/09/2022  HPI: Jacob [name] is a 46 year old male with history of right MCA occlusion s/p tenecteplase and interventional radiology with TICI3 who required decompressive craniectomy by Dr. Arnoldo Morale on 4/24. He presented for elective cranioplasty. History is also significant for right peroneal DVT on Eliquis 5 mg daily. He also required tracheostomy for prolonged respiratory failure with tracheostomy placed 5/1. His deficits include left hemiplegia from his prior stroke. He developed loose stools on POD #1 and stool was positive for C. diff toxin. He is on oral vancomycin. KUB unremarkable. Tolerating carb modified diet. The patient requires inpatient physical medicine and rehabilitation evaluations and treatment secondary to dysfunction due to prior CVA with left hemiplegia.   Review of Systems  Constitutional:  Negative for chills and fever.  HENT:  Negative for hearing loss and sore throat.   Eyes:  Negative for blurred vision and double vision.  Respiratory:  Negative for cough and shortness of breath.   Cardiovascular:  Negative for chest pain and palpitations.  Gastrointestinal:  Positive for diarrhea. Negative for nausea and vomiting.  Genitourinary:  Negative for dysuria and urgency.  Neurological:  Positive for headaches. Negative for dizziness.   Past Medical History:  Diagnosis Date   Allergy    Diabetes mellitus without complication ([place])    GERD (gastroesophageal reflux disease)    Hypertension    Paralysis ([place])    Stroke [facility])    Past Surgical History:  Procedure Laterality Date   APPENDECTOMY     BUBBLE STUDY  10/02/2021   Procedure: BUBBLE STUDY;  Surgeon: Fay Records, MD;  Location: [place];  Service: Cardiovascular;;   CRANIOPLASTY Right 02/09/2022   Procedure: Craniectomy with Replacement of Bone Flap From  the Abdomen;  Surgeon: Newman Pies, MD;  Location: [place];  Service: Neurosurgery;  Laterality: Right;   CRANIOTOMY Right 09/15/2021   Procedure: RIGHT DECOMPRESSIVE CRANIOTOMY, PLACEMENT OF BONE FLAP IN ABDOMEN;  Surgeon: Newman Pies, MD;  Location: [place];  Service: Neurosurgery;  Laterality: Right;   IR CT HEAD LTD  09/12/2021   IR PERCUTANEOUS ART THROMBECTOMY/INFUSION INTRACRANIAL INC DIAG ANGIO  09/12/2021   IR US GUIDE VASC ACCESS RIGHT  09/12/2021   LAPAROSCOPIC APPENDECTOMY  10/24/2013   Procedure: APPENDECTOMY LAPAROSCOPIC;  Surgeon: Shann Medal, MD;  Location: [facility];  Service: General;;   RADIOLOGY WITH ANESTHESIA N/A 09/12/2021   Procedure: IR WITH ANESTHESIA;  Surgeon: Luanne Bras, MD;  Location: [place];  Service: Radiology;  Laterality: N/A;   TEE WITHOUT CARDIOVERSION N/A 10/02/2021   Procedure: TRANSESOPHAGEAL ECHOCARDIOGRAM (TEE);  Surgeon: Fay Records, MD;  Location: [facility] ENDOSCOPY;  Service: Cardiovascular;  Laterality: N/A;   Family History  Problem Relation Age of Onset   Asthma Son    Social History:  reports that he has never smoked. He has never used smokeless tobacco. He reports that he does not currently use alcohol. He reports that he does not use drugs. Allergies:  Allergies  Allergen Reactions   Hydrochlorothiazide Other (See Comments)    Bilateral muscle cramping   Medications Prior to Admission  Medication Sig Dispense Refill   acetaminophen (TYLENOL) 325 MG tablet Take 650 mg by mouth every 12 (twelve) hours as needed for moderate pain.     amLODipine (NORVASC) 10 MG tablet Place 1 tablet (10 mg total) into feeding tube daily. (Patient taking differently:  Take 10 mg by mouth daily.) 30 tablet 1   apixaban (ELIQUIS) 5 MG TABS tablet Take 1 tablet (5 mg total) by mouth 2 (two) times daily. 60 tablet 1   atorvastatin (LIPITOR) 40 MG tablet Place 1 tablet (40 mg total) into feeding tube daily. (Patient taking differently: Take 40 mg by mouth daily.)  30 tablet 1   cloNIDine (CATAPRES - DOSED IN MG/24 HR) 0.3 mg/24hr patch Place 1 patch (0.3 mg total) onto the skin once a week. 4 patch 12   Ensure Plus (ENSURE PLUS) LIQD Take 237 mLs by mouth in the morning, at noon, in the evening, and at bedtime.     escitalopram (LEXAPRO) 20 MG tablet Place 1 tablet (20 mg total) into feeding tube daily. (Patient taking differently: Take 20 mg by mouth daily.) 30 tablet 1   famotidine (PEPCID) 20 MG tablet Take 20 mg by mouth 2 (two) times daily.     HUMALOG KWIKPEN 100 UNIT/ML KwikPen Inject 0-10 Units into the skin every 4 (four) hours. Blood glucose of 150 and below = 0 units, 151-200= 2 units, 201-250= 4 units, 251-300= 6 units, 301-350= 8 units, 351-400= 10 units, 401--999 If CBG is >400 NOTIFY MD/NP     insulin glargine (LANTUS) 100 UNIT/ML injection Inject 17 Units into the skin every 12 (twelve) hours.     lisinopril (ZESTRIL) 40 MG tablet Place 1 tablet (40 mg total) into feeding tube daily. 30 tablet 1   melatonin 3 MG TABS tablet Take 3 mg by mouth at bedtime.     metoprolol tartrate (LOPRESSOR) 25 MG tablet Take 25 mg by mouth 2 (two) times daily.     oxyCODONE (OXY IR/ROXICODONE) 5 MG immediate release tablet Take 2.5 mg by mouth every 6 (six) hours as needed for pain.     polyethylene glycol (MIRALAX / GLYCOLAX) 17 g packet Place 17 g into feeding tube daily as needed for mild constipation, moderate constipation or severe constipation. 14 each 0   topiramate (TOPAMAX) 50 MG tablet Place 1 tablet (50 mg total) into feeding tube 2 (two) times daily. (Patient taking differently: Take 50 mg by mouth every 12 (twelve) hours.) 60 tablet 1   traZODone (DESYREL) 50 MG tablet Take 25 mg by mouth at bedtime.     acetaminophen (TYLENOL) 160 MG/5ML solution Place 20.3 mLs (650 mg total) into feeding tube every 4 (four) hours as needed for mild pain (or temp > 37.5 C (99.5 F)). (Patient not taking: Reported on 12/31/2021) 120 mL 0   albuterol (PROVENTIL) (2.5  MG/3ML) 0.083% nebulizer solution Take 3 mLs (2.5 mg total) by nebulization every 6 (six) hours as needed for wheezing or shortness of breath. (Patient not taking: Reported on 12/08/2021) 75 mL 12   bisacodyl (DULCOLAX) 10 MG suppository Place 1 suppository (10 mg total) rectally daily at 2 PM. (Patient not taking: Reported on 12/08/2021) 12 suppository 0   carvedilol (COREG) 25 MG tablet Place 1 tablet (25 mg total) into feeding tube 2 (two) times daily with a meal. (Patient not taking: Reported on 12/08/2021) 30 tablet 1   chlorproMAZINE (THORAZINE) 10 MG tablet Place 1 tablet (10 mg total) into feeding tube 3 (three) times daily as needed for hiccoughs. (Patient not taking: Reported on 12/08/2021) 30 tablet 1   insulin aspart (NOVOLOG) 100 UNIT/ML injection Inject 8 Units into the skin 4 (four) times daily. (Patient not taking: Reported on 12/31/2021) 10 mL 11   insulin detemir (LEVEMIR) 100 UNIT/ML injection Inject  0.5 mLs (50 Units total) into the skin 2 (two) times daily. (Patient not taking: Reported on 12/08/2021) 10 mL 11   linezolid (ZYVOX) 600 MG tablet Place 1 tablet (600 mg total) into feeding tube every 12 (twelve) hours. (Patient not taking: Reported on 12/08/2021) 7 tablet 0   ondansetron (ZOFRAN) 4 MG/2ML SOLN injection Inject 2 mLs (4 mg total) into the vein every 4 (four) hours as needed for nausea or vomiting. (Patient not taking: Reported on 12/31/2021) 2 mL 0   pantoprazole sodium (PROTONIX) 40 mg Place 40 mg into feeding tube daily. (Patient not taking: Reported on 12/08/2021) 30 packet 1      Home: Home Living Family/patient expects to be discharged to:: Skilled nursing facility Living Arrangements: Spouse/significant other, Children Available Help at Discharge: Family, Available 24 hours/day Type of Home: House Home Access: Ramped entrance Home Layout: Able to live on main level with bedroom/bathroom, Multi-level Alternate Level Stairs-Number of Steps: full flight Alternate Level  Stairs-Rails: Left Bathroom Shower/Tub: Health visitor: Standard Bathroom Accessibility: Yes Home Equipment: Agricultural consultant (2 wheels), Wheelchair - manual, Hospital bed, Other (comment) Additional Comments: reclining wheelchair in the room  Lives With: Spouse, Family   Functional History: Prior Function Prior Level of Function : Needs assist Mobility Comments: reports was at rehab at [facility]; recently home and wife assisting with transfers to wheelchair and pt wearing briefs due to incontinence; daughter also assisting when wife at work, but had recently applied for and starting to interview for an aide ADLs Comments: mod-total A for ADLs at bed or WC level. wife and daughter assissting.  Functional Status:  Mobility: Bed Mobility Overal bed mobility: Needs Assistance Bed Mobility: Supine to Sit Rolling: Mod assist, +2 for physical assistance Supine to sit: Mod assist, +2 for safety/equipment, HOB elevated General bed mobility comments: increased time to initiate with multimodal cues and assist for L LE, to lift trunk and help scoot hips Transfers Overall transfer level: Needs assistance Equipment used: 1 person hand held assist Transfers: Sit to/from Stand, Bed to chair/wheelchair/BSC Sit to Stand: Min assist, Mod assist Bed to/from chair/wheelchair/BSC transfer type:: Stand pivot Stand pivot transfers: Mod assist, +2 safety/equipment General transfer comment: up to stand initially from EOB at elevated height with min A, then pivot to [facility] with mod A +2 for safety with lines and managing cords; to recliner mod A sit to stand from toilet and pt remaining flexed and leaning on PT progressing to max A for balance while OT moved BSC and placed recliner      ADL: ADL Overall ADL's : Needs assistance/impaired Eating/Feeding: Set up, Sitting Grooming: Set up, Min guard, Sitting Upper Body Bathing: Moderate assistance, Sitting Lower Body Bathing: Maximal  assistance, Sit to/from stand, +2 for physical assistance, +2 for safety/equipment Upper Body Dressing : Sitting, Moderate assistance Lower Body Dressing: Maximal assistance, Sit to/from stand Toilet Transfer: Moderate assistance, BSC/3in1 Toileting- Clothing Manipulation and Hygiene: Maximal assistance, +2 for physical assistance Functional mobility during ADLs: Moderate assistance General ADL Comments: limited by L hemi, impaired cog, decreased activity tolernace  Cognition: Cognition Overall Cognitive Status: Impaired/Different from baseline Orientation Level: Oriented X4 Cognition Arousal/Alertness: Awake/alert Behavior During Therapy: WFL for tasks assessed/performed Overall Cognitive Status: Impaired/Different from baseline Area of Impairment: Attention, Following commands, Problem solving Current Attention Level: Sustained Following Commands: Follows one step commands inconsistently, Follows one step commands with increased time Problem Solving: Decreased initiation, Slow processing General Comments: required significantly increased time for processing and multiple cues.  Follow one step directions well, difficulty with 2+ steps  Physical Exam: Blood pressure (!) 150/93, pulse 85, temperature 99.1 F (37.3 C), temperature source Oral, resp. rate 19, height 6\' 1"  (1.854 m), weight 81.6 kg, SpO2 100 %. Physical Exam Constitutional:      General: He is not in acute distress. HENT:     Head:     Comments: Staple line intact with mild soft tissue swelling Eyes:     Extraocular Movements: Extraocular movements intact.     Pupils: Pupils are equal, round, and reactive to light.  Cardiovascular:     Rate and Rhythm: Normal rate and regular rhythm.  Abdominal:     General: Bowel sounds are normal.     Palpations: Abdomen is soft.     Comments: Right transverse abdominal incision healing without signs of infection  Musculoskeletal:        General: No swelling.  Neurological:      Mental Status: He is alert and oriented to person, place, and time.     Motor: Weakness present.     Results for orders placed or performed during the hospital encounter of 02/09/22 (from the past 48 hour(s))  Glucose, capillary     Status: Abnormal   Collection Time: 02/11/22  9:33 PM  Result Value Ref Range   Glucose-Capillary 151 (H) 70 - 99 mg/dL    Comment: Glucose reference range applies only to samples taken after fasting for at least 8 hours.   Comment 1 Notify RN    Comment 2 Document in Chart   Glucose, capillary     Status: Abnormal   Collection Time: 02/12/22  7:43 AM  Result Value Ref Range   Glucose-Capillary 149 (H) 70 - 99 mg/dL    Comment: Glucose reference range applies only to samples taken after fasting for at least 8 hours.  Glucose, capillary     Status: Abnormal   Collection Time: 02/12/22 11:19 AM  Result Value Ref Range   Glucose-Capillary 171 (H) 70 - 99 mg/dL    Comment: Glucose reference range applies only to samples taken after fasting for at least 8 hours.  Glucose, capillary     Status: Abnormal   Collection Time: 02/12/22  4:52 PM  Result Value Ref Range   Glucose-Capillary 168 (H) 70 - 99 mg/dL    Comment: Glucose reference range applies only to samples taken after fasting for at least 8 hours.  Glucose, capillary     Status: Abnormal   Collection Time: 02/12/22  9:09 PM  Result Value Ref Range   Glucose-Capillary 141 (H) 70 - 99 mg/dL    Comment: Glucose reference range applies only to samples taken after fasting for at least 8 hours.  Glucose, capillary     Status: Abnormal   Collection Time: 02/13/22  7:48 AM  Result Value Ref Range   Glucose-Capillary 130 (H) 70 - 99 mg/dL    Comment: Glucose reference range applies only to samples taken after fasting for at least 8 hours.  Glucose, capillary     Status: Abnormal   Collection Time: 02/13/22 11:29 AM  Result Value Ref Range   Glucose-Capillary 152 (H) 70 - 99 mg/dL    Comment: Glucose  reference range applies only to samples taken after fasting for at least 8 hours.   DG Abd Portable 1V  Result Date: 02/12/2022 CLINICAL DATA:  C difficile.  Change in stool. EXAM: PORTABLE ABDOMEN - 1 VIEW COMPARISON:  Abdominal radiograph dated 10/02/2021 and 09/23/2021  and CT dated 10/23/2013. FINDINGS: There is moderate stool throughout the colon. No bowel dilatation or evidence of obstruction. No free air radiopaque calculi. Cutaneous clips over the right lower abdomen. Degenerative changes of the hips. No acute osseous pathology. IMPRESSION: Moderate colonic stool burden. No bowel obstruction. Electronically Signed   By: Elgie Collard M.D.   On: 02/12/2022 18:17      Blood pressure (!) 150/93, pulse 85, temperature 99.1 F (37.3 C), temperature source Oral, resp. rate 19, height 6\' 1"  (1.854 m), weight 81.6 kg, SpO2 100 %.  Medical Problem List and Plan: 1. Functional deficits secondary to prior right MCA stroke, left hemiplegia  -patient may *** shower  -ELOS/Goals: *** 2.  Antithrombotics: -DVT/anticoagulation:  Pharmaceutical: Eliquis  -antiplatelet therapy: none 3. Pain Management: Tylenol, oxycodone, hydrocodone, Robaxin as needed 4. Mood/Behavior/Sleep: LCSW to evaluate and provide emotional support  -antipsychotic agents: n/a  -depression: continue Lexapro 20 mg daily  -insomnia: continue melatonin 5. Neuropsych/cognition: This patient is capable of making decisions on his own behalf. 6. Skin/Wound Care: routine skin care checks  -monitor scalp and abdominal surgical incisions 7. Fluids/Electrolytes/Nutrition: Routine Is and Os and follow-up chemistries  -carb modified diet 8: DM: Hgb A1c = 8.1; on SSI  -consider restarting home Lantus and Novolog 9: Hypertension: monitor  -continue clonidine patch 0.3 mg weekly  -continue amlodipine 10 mg daily  -continue Lopressor 25 mg BID  -continue lisinopril 40 mg daily 10: C. diff diarrhea: continue oral vanc, end 9/30;  discontinue Colace for now     ***  10/30, PA-C 02/13/2022

## 2022-02-13 NOTE — Progress Notes (Signed)
Occupational Therapy Treatment Patient Details Name: Jacob [name] MRN: [id number] DOB: Feb 06, 1976 Today's Date: 02/13/2022   History of present illness 46 y.o. male admitted for cranioplasty performed 9/18 due to sunken craniectomy flap.  Previously underwent R frontotemporal parietal hemicraniectomy on 4/24 after being admitted with R MCA/PCA infarct on 09/12/21.   PMH includes HTN and DM, also had R peroneal DVT.   OT comments  Pt making progress toward goals. Session completed with PT to address wc mobility with min A and moderate multimodal cuing due to apparent visual perceptual deficits and L hemiplegia. L resting hand Splint adjusted and positioned. Recommend pt wear splint 2 hrs on/2 hrs off and as tolerated throughout the night to prevent further L hand contracture. May assess use of sling/K tape L shoulder to reduce pain/discomfort due to shoulder subluxation and abnormal positioning. At this time feel pt would benefit from inpatient rehab at [facility] to maximize functional independence and facilitate safe DC home with 24/7 assistance. Will continue to follow.    Recommendations for follow up therapy are one component of a multi-disciplinary discharge planning process, led by the attending physician.  Recommendations may be updated based on patient status, additional functional criteria and insurance authorization.    Follow Up Recommendations  Acute inpatient rehab (3hours/day)    Assistance Recommended at Discharge Frequent or constant Supervision/Assistance  Patient can return home with the following  A lot of help with walking and/or transfers;A lot of help with bathing/dressing/bathroom;Direct supervision/assist for medications management;Direct supervision/assist for financial management;Assist for transportation;Help with stairs or ramp for entrance;Assistance with cooking/housework   Equipment Recommendations       Recommendations for Other Services Rehab consult    Precautions  / Restrictions Precautions Precautions: Fall Precaution Comments: L hemiplegia; painful LUE/shoulder; developing flexor contracture Required Braces or Orthoses: Splint/Cast       Mobility Bed Mobility Overal bed mobility:  (OOB in chair)                  Transfers Overall transfer level: Needs assistance                 General transfer comment: squat pivot with mod A; cues for safe technique; pt unaware of getting legs crossed; pt unable to problem solve to set up safe transfer; would do best with consistent squat pivot transfer training; would benefit from brake extension on L     Balance     Sitting balance-Leahy Scale: Fair       Standing balance-Leahy Scale: Poor                             ADL either performed or assessed with clinical judgement   ADL   Eating/Feeding: Set up;Supervision/ safety   Grooming: Minimal assistance                       Toileting- Clothing Manipulation and Hygiene: Maximal assistance       Functional mobility during ADLs: Moderate assistance;+2 for safety/equipment      Extremity/Trunk Assessment Upper Extremity Assessment Upper Extremity Assessment: LUE deficits/detail LUE Deficits / Details: contracted into flexion at 2nd-5th digits. wrist and elbow PROM is WFL. sublux at elbow. no activation noted; may benefit fomr sling and /or taping; 1 finger width subluxation' non-functional            Vision   Additional Comments: L field cut   Perception Perception Perception:  Impaired (L neglect); mod cues ot locate objects in L field   Praxis      Cognition Arousal/Alertness: Awake/alert Behavior During Therapy: Impulsive Overall Cognitive Status: Impaired/Different from baseline Area of Impairment: Attention, Safety/judgement, Awareness, Problem solving                   Current Attention Level: Selective   Following Commands: Follows one step commands  consistently Safety/Judgement: Decreased awareness of safety, Decreased awareness of deficits Awareness: Emergent Problem Solving: Slow processing, Decreased initiation, Difficulty sequencing, Requires verbal cues, Requires tactile cues          Exercises Other Exercises Other Exercises: L hand PROM; flexion contractures digits    Shoulder Instructions       General Comments splint adjusted    Pertinent Vitals/ Pain       Pain Assessment Pain Assessment: Faces Faces Pain Scale: Hurts little more Pain Location: LUE/shoulder Pain Descriptors / Indicators: Grimacing, Guarding Pain Intervention(s): Limited activity within patient's tolerance  Home Living                                          Prior Functioning/Environment              Frequency  Min 2X/week        Progress Toward Goals  OT Goals(current goals can now be found in the care plan section)  Progress towards OT goals: Progressing toward goals  Acute Rehab OT Goals Patient Stated Goal: to get some rehab before going home OT Goal Formulation: With patient Time For Goal Achievement: 02/25/22 Potential to Achieve Goals: Good ADL Goals Pt Will Perform Grooming: with modified independence;sitting Pt Will Perform Upper Body Dressing: with modified independence;sitting Pt Will Perform Lower Body Dressing: with min assist;sit to/from stand Pt Will Transfer to Toilet: with min assist;stand pivot transfer;bedside commode Additional ADL Goal #1: Pt will use LUE as a functional assist during ADL with min A  Plan Discharge plan remains appropriate    Co-evaluation      Reason for Co-Treatment: For patient/therapist safety   OT goals addressed during session: ADL's and self-care      AM-PAC OT "6 Clicks" Daily Activity     Outcome Measure   Help from another person eating meals?: A Little Help from another person taking care of personal grooming?: A Little Help from another person  toileting, which includes using toliet, bedpan, or urinal?: A Lot Help from another person bathing (including washing, rinsing, drying)?: A Lot Help from another person to put on and taking off regular upper body clothing?: A Lot Help from another person to put on and taking off regular lower body clothing?: A Lot 6 Click Score: 14    End of Session Equipment Utilized During Treatment: Gait belt  OT Visit Diagnosis: Unsteadiness on feet (R26.81);Other abnormalities of gait and mobility (R26.89);Muscle weakness (generalized) (M62.81);Hemiplegia and hemiparesis;Pain;Other symptoms and signs involving cognitive function Hemiplegia - Right/Left: Left Hemiplegia - dominant/non-dominant: Non-Dominant Hemiplegia - caused by: Cerebral infarction Pain - Right/Left: Left Pain - part of body: Shoulder;Arm   Activity Tolerance Patient tolerated treatment well   Patient Left in chair;with chair alarm set   Nurse Communication Mobility status;Other (comment) (splint)        Time: 0160-1093 OT Time Calculation (min): 35 min  Charges: OT General Charges $OT Visit: 1 Visit OT Treatments $Self Care/Home Management : 8-22  mins  Luisa Dago, OT/L   Acute OT Clinical Specialist Acute Rehabilitation Services Pager [phone] Office [phone]   [facility] 02/13/2022, 3:19 PM

## 2022-02-13 NOTE — Progress Notes (Signed)
   Providing Compassionate, Quality Care - Together   Subjective: Patient reports no new issues overnight.  Objective: Vital signs in last 24 hours: Temp:  [98.4 F (36.9 C)-99.3 F (37.4 C)] 99.1 F (37.3 C) (09/22 1133) Pulse Rate:  [80-101] 85 (09/22 1133) Resp:  [10-20] 19 (09/22 1133) BP: (119-150)/(75-93) 150/93 (09/22 1133) SpO2:  [97 %-100 %] 100 % (09/22 1133)  Intake/Output from previous day: 09/21 0701 - 09/22 0700 In: 4327.8 [P.O.:480; I.V.:3847.8] Out: 850 [Urine:850] Intake/Output this shift: Total I/O In: 240 [P.O.:240] Out: 500 [Urine:500]  Alert and oriented x 4 PERRLA Left hemiplegia, there is some proximal movement of the LLE during today's exam Incisions are closed with staples. Incisions are clean, dry, and intact.  Lab Results: No results for input(s): "WBC", "HGB", "HCT", "PLT" in the last 72 hours. BMET No results for input(s): "NA", "K", "CL", "CO2", "GLUCOSE", "BUN", "CREATININE", "CALCIUM" in the last 72 hours.  Studies/Results: DG Abd Portable 1V  Result Date: 02/12/2022 CLINICAL DATA:  C difficile.  Change in stool. EXAM: PORTABLE ABDOMEN - 1 VIEW COMPARISON:  Abdominal radiograph dated 10/02/2021 and 09/23/2021 and CT dated 10/23/2013. FINDINGS: There is moderate stool throughout the colon. No bowel dilatation or evidence of obstruction. No free air radiopaque calculi. Cutaneous clips over the right lower abdomen. Degenerative changes of the hips. No acute osseous pathology. IMPRESSION: Moderate colonic stool burden. No bowel obstruction. Electronically Signed   By: Anner Crete M.D.   On: 02/12/2022 18:17    Assessment/Plan: Patient underwent cranioplasty by Dr. Arnoldo Morale on 02/09/2022. He developed diarrhea on post op day 1 and was positive for C. Diff. Treatment for C. Diff was started on 02/11/2022. Abdominal x-ray from 02/12/2022 showed no signs of ileus or intestinal blockage.   LOS: 4 days     -Continue to mobilize with therapies.  Awaiting insurance approval for CIR. -Continue PO Vancomycin.   Viona Gilmore, DNP, AGNP-C Nurse Practitioner  [facility] [place] [address], [address], [place], [place] [address] P: [phone]    F: [phone]  02/13/2022, 12:32 PM

## 2022-02-13 NOTE — Plan of Care (Signed)
  Problem: Education: Goal: Knowledge of the prescribed therapeutic regimen will improve Outcome: Progressing   Problem: Clinical Measurements: Goal: Usual level of consciousness will be regained or maintained. Outcome: Progressing Goal: Neurologic status will improve Outcome: Progressing Goal: Ability to maintain intracranial pressure will improve Outcome: Progressing   Problem: Clinical Measurements: Goal: Will remain free from infection Outcome: Progressing   Problem: Education: Goal: Knowledge of the prescribed therapeutic regimen will improve Outcome: Progressing   Problem: Skin Integrity: Goal: Demonstration of wound healing without infection will improve Outcome: Progressing

## 2022-02-13 NOTE — Progress Notes (Signed)
Physical Therapy Treatment Patient Details Name: Jacob [name] MRN: [id number] DOB: 11/12/1975 Today's Date: 02/13/2022   History of Present Illness 46 y.o. male admitted for cranioplasty performed 9/18 due to sunken craniectomy flap.  Previously underwent R frontotemporal parietal hemicraniectomy on 4/24 after being admitted with R MCA/PCA infarct on 09/12/21.   PMH includes HTN and DM, also had R peroneal DVT.    PT Comments    The pt was agreeable to session, able to practice multiple stand-pivot transfers and self-propulsion in [facility] with use of RUE and RLE. The pt continues to need significant cues for problem solving steering, coordination of use of UE and LE with propulsion, set-up for safe transfers, and positioning. Continue to recommend acute inpatient rehab to maximize independence with transfers, WC mobility, and to improve functional strength in LLE.     Recommendations for follow up therapy are one component of a multi-disciplinary discharge planning process, led by the attending physician.  Recommendations may be updated based on patient status, additional functional criteria and insurance authorization.  Follow Up Recommendations  Acute inpatient rehab (3hours/day)     Assistance Recommended at Discharge Frequent or constant Supervision/Assistance  Patient can return home with the following Assistance with cooking/housework;Assist for transportation;A lot of help with bathing/dressing/bathroom;A lot of help with walking and/or transfers;Help with stairs or ramp for entrance   Equipment Recommendations  None recommended by PT    Recommendations for Other Services       Precautions / Restrictions Precautions Precautions: Fall Precaution Comments: L hemiplegia; painful LUE/shoulder; developing flexor contracture Required Braces or Orthoses: Splint/Cast Restrictions Weight Bearing Restrictions: No     Mobility  Bed Mobility Overal bed mobility:  (OOB in chair)                   Transfers Overall transfer level: Needs assistance Equipment used: 1 person hand held assist Transfers: Bed to chair/wheelchair/BSC   Stand pivot transfers: Min assist, Mod assist         General transfer comment: squat pivot with mod A. max cues for positioning and set-up, then pt able to complete with modA to R    Ambulation/Gait               General Gait Details: unable   Theme park manager mobility: Yes Wheelchair propulsion: Right upper extremity, Right lower extremity Wheelchair parts: Needs assistance Distance: 75 Wheelchair Assistance Details (indicate cue type and reason): assist with managing breaks, positioning, leg rests, removing armrests, steering, and generally problem solving how to manage or manipulate WC.  Modified Rankin (Stroke Patients Only)       Balance Overall balance assessment: Needs assistance Sitting-balance support: Single extremity supported Sitting balance-Leahy Scale: Fair     Standing balance support: Single extremity supported Standing balance-Leahy Scale: Poor Standing balance comment: minA for static stance with single UE support                            Cognition Arousal/Alertness: Awake/alert Behavior During Therapy: Impulsive Overall Cognitive Status: Impaired/Different from baseline Area of Impairment: Attention, Safety/judgement, Awareness, Problem solving                   Current Attention Level: Selective   Following Commands: Follows one step commands consistently Safety/Judgement: Decreased awareness of safety, Decreased awareness of deficits Awareness: Emergent Problem Solving: Slow processing,  Decreased initiation, Difficulty sequencing, Requires verbal cues, Requires tactile cues General Comments: increased processing time, increased cues for technique, often needing cues repeated through session. poor problem  solving        Exercises Other Exercises Other Exercises: L hand PROM; flexion contractures digits    General Comments General comments (skin integrity, edema, etc.): resting hand splint put on      Pertinent Vitals/Pain Pain Assessment Pain Assessment: Faces Faces Pain Scale: Hurts little more Pain Location: LUE/shoulder Pain Descriptors / Indicators: Grimacing, Guarding Pain Intervention(s): Limited activity within patient's tolerance, Monitored during session, Repositioned     PT Goals (current goals can now be found in the care plan section) Acute Rehab PT Goals Patient Stated Goal: to go to rehab PT Goal Formulation: With patient Time For Goal Achievement: 02/24/22 Potential to Achieve Goals: Fair Progress towards PT goals: Progressing toward goals    Frequency    Min 3X/week      PT Plan Current plan remains appropriate    Co-evaluation PT/OT/SLP Co-Evaluation/Treatment: Yes Reason for Co-Treatment: Necessary to address cognition/behavior during functional activity;For patient/therapist safety;To address functional/ADL transfers PT goals addressed during session: Mobility/safety with mobility;Balance;Proper use of DME;Strengthening/ROM OT goals addressed during session: ADL's and self-care      AM-PAC PT "6 Clicks" Mobility   Outcome Measure  Help needed turning from your back to your side while in a flat bed without using bedrails?: A Lot Help needed moving from lying on your back to sitting on the side of a flat bed without using bedrails?: Total Help needed moving to and from a bed to a chair (including a wheelchair)?: A Lot Help needed standing up from a chair using your arms (e.g., wheelchair or bedside chair)?: A Lot Help needed to walk in hospital room?: Total Help needed climbing 3-5 steps with a railing? : Total 6 Click Score: 9    End of Session Equipment Utilized During Treatment: Gait belt Activity Tolerance: Patient limited by  fatigue;Treatment limited secondary to medical complications (Comment) Patient left: in chair;with call bell/phone within reach;with chair alarm set;with family/visitor present Nurse Communication: Mobility status PT Visit Diagnosis: Other abnormalities of gait and mobility (R26.89);Muscle weakness (generalized) (M62.81)     Time: 3474-2595 PT Time Calculation (min) (ACUTE ONLY): 37 min  Charges:  $Therapeutic Activity: 8-22 mins                     Vickki Muff, PT, DPT   Acute Rehabilitation Department   Ronnie Derby 02/13/2022, 6:32 PM

## 2022-02-13 NOTE — Consult Note (Signed)
   [facility] Inpatient Consult   02/13/2022  Jacob [name] [birth year] [id number]  Managed Medicaid:  Primary Care Provider:  Townsend Roger, MD   Patient is currently active with [facility] for care coordination services with BSW for personal care service [pcs] needs. Patient was previously at [facility]  Our community based plan of care has focused on disease management and community resource support.  Chart review reveals patient and family seeking inpatient rehabilitation for post hospital needs.    Plan: Continue to follow for community care coordination needs with [facility] CC team.   Of note, [facility] Care Management services does not replace or interfere with any services that are needed or arranged by inpatient [facility] care management team.  For additional questions or referrals please contact:  Natividad Brood, RN BSN [place]  [phone] business mobile phone Toll free office [phone]  *[place]  [phone] Fax number: [phone] [email or web address]

## 2022-02-13 NOTE — Progress Notes (Addendum)
Inpatient Rehab Admissions Coordinator:  Saw pt at bedside. Informed him that we are awaiting insurance authorization. Also awaiting medical clearance and bed availability. Will continue to follow.   ADDENDUM 1342: Received insurance authorization.    Gayland Curry, [place], [place] Admissions Coordinator [phone]

## 2022-02-13 NOTE — Plan of Care (Signed)
Problem: Education: Goal: Knowledge of the prescribed therapeutic regimen will improve Outcome: Progressing   Problem: Clinical Measurements: Goal: Usual level of consciousness will be regained or maintained. Outcome: Progressing Goal: Neurologic status will improve Outcome: Progressing Goal: Ability to maintain intracranial pressure will improve Outcome: Progressing   Problem: Skin Integrity: Goal: Demonstration of wound healing without infection will improve Outcome: Progressing   Problem: Education: Goal: Knowledge of General Education information will improve Description: Including pain rating scale, medication(s)/side effects and non-pharmacologic comfort measures Outcome: Progressing   Problem: Health Behavior/Discharge Planning: Goal: Ability to manage health-related needs will improve Outcome: Progressing   Problem: Clinical Measurements: Goal: Ability to maintain clinical measurements within normal limits will improve Outcome: Progressing Goal: Will remain free from infection Outcome: Progressing Goal: Diagnostic test results will improve Outcome: Progressing Goal: Respiratory complications will improve Outcome: Progressing Goal: Cardiovascular complication will be avoided Outcome: Progressing   Problem: Activity: Goal: Risk for activity intolerance will decrease Outcome: Progressing   Problem: Nutrition: Goal: Adequate nutrition will be maintained Outcome: Progressing   Problem: Coping: Goal: Level of anxiety will decrease Outcome: Progressing   Problem: Elimination: Goal: Will not experience complications related to bowel motility Outcome: Progressing Goal: Will not experience complications related to urinary retention Outcome: Progressing   Problem: Pain Managment: Goal: General experience of comfort will improve Outcome: Progressing   Problem: Safety: Goal: Ability to remain free from injury will improve Outcome: Progressing   Problem:  Skin Integrity: Goal: Risk for impaired skin integrity will decrease Outcome: Progressing   Problem: Education: Goal: Ability to describe self-care measures that may prevent or decrease complications (Diabetes Survival Skills Education) will improve Outcome: Progressing Goal: Individualized Educational Video(s) Outcome: Progressing   Problem: Coping: Goal: Ability to adjust to condition or change in health will improve Outcome: Progressing   Problem: Fluid Volume: Goal: Ability to maintain a balanced intake and output will improve Outcome: Progressing   Problem: Health Behavior/Discharge Planning: Goal: Ability to identify and utilize available resources and services will improve Outcome: Progressing Goal: Ability to manage health-related needs will improve Outcome: Progressing   Problem: Education: Goal: Knowledge of the prescribed therapeutic regimen will improve Outcome: Progressing   Problem: Clinical Measurements: Goal: Usual level of consciousness will be regained or maintained. Outcome: Progressing Goal: Neurologic status will improve Outcome: Progressing Goal: Ability to maintain intracranial pressure will improve Outcome: Progressing   Problem: Skin Integrity: Goal: Demonstration of wound healing without infection will improve Outcome: Progressing   Problem: Education: Goal: Knowledge of General Education information will improve Description: Including pain rating scale, medication(s)/side effects and non-pharmacologic comfort measures Outcome: Progressing   Problem: Health Behavior/Discharge Planning: Goal: Ability to manage health-related needs will improve Outcome: Progressing   Problem: Clinical Measurements: Goal: Ability to maintain clinical measurements within normal limits will improve Outcome: Progressing Goal: Will remain free from infection Outcome: Progressing Goal: Diagnostic test results will improve Outcome: Progressing Goal: Respiratory  complications will improve Outcome: Progressing Goal: Cardiovascular complication will be avoided Outcome: Progressing   Problem: Activity: Goal: Risk for activity intolerance will decrease Outcome: Progressing   Problem: Nutrition: Goal: Adequate nutrition will be maintained Outcome: Progressing   Problem: Coping: Goal: Level of anxiety will decrease Outcome: Progressing   Problem: Elimination: Goal: Will not experience complications related to bowel motility Outcome: Progressing Goal: Will not experience complications related to urinary retention Outcome: Progressing   Problem: Pain Managment: Goal: General experience of comfort will improve Outcome: Progressing   Problem: Safety: Goal: Ability to remain free from  injury will improve Outcome: Progressing   Problem: Skin Integrity: Goal: Risk for impaired skin integrity will decrease Outcome: Progressing

## 2022-02-14 ENCOUNTER — Other Ambulatory Visit: Payer: Self-pay

## 2022-02-14 ENCOUNTER — Inpatient Hospital Stay (HOSPITAL_COMMUNITY)
Admission: RE | Admit: 2022-02-14 | Discharge: 2022-03-07 | DRG: 945 | Disposition: A | Discharge status: Home / Self Care | Payer: Medicaid Other | Source: Intra-hospital | Attending: Physical Medicine and Rehabilitation | Admitting: Physical Medicine and Rehabilitation

## 2022-02-14 ENCOUNTER — Encounter (HOSPITAL_COMMUNITY): Payer: Self-pay | Admitting: Physical Medicine and Rehabilitation

## 2022-02-14 DIAGNOSIS — S43002D Unspecified subluxation of left shoulder joint, subsequent encounter: Secondary | ICD-10-CM | POA: Diagnosis not present

## 2022-02-14 DIAGNOSIS — Z7901 Long term (current) use of anticoagulants: Secondary | ICD-10-CM

## 2022-02-14 DIAGNOSIS — Z9889 Other specified postprocedural states: Secondary | ICD-10-CM

## 2022-02-14 DIAGNOSIS — G8114 Spastic hemiplegia affecting left nondominant side: Secondary | ICD-10-CM

## 2022-02-14 DIAGNOSIS — M25512 Pain in left shoulder: Secondary | ICD-10-CM | POA: Diagnosis present

## 2022-02-14 DIAGNOSIS — F32A Depression, unspecified: Secondary | ICD-10-CM | POA: Diagnosis present

## 2022-02-14 DIAGNOSIS — I69354 Hemiplegia and hemiparesis following cerebral infarction affecting left non-dominant side: Secondary | ICD-10-CM | POA: Diagnosis not present

## 2022-02-14 DIAGNOSIS — I10 Essential (primary) hypertension: Secondary | ICD-10-CM | POA: Diagnosis present

## 2022-02-14 DIAGNOSIS — Z888 Allergy status to other drugs, medicaments and biological substances status: Secondary | ICD-10-CM | POA: Diagnosis not present

## 2022-02-14 DIAGNOSIS — Z794 Long term (current) use of insulin: Secondary | ICD-10-CM | POA: Diagnosis not present

## 2022-02-14 DIAGNOSIS — J9621 Acute and chronic respiratory failure with hypoxia: Secondary | ICD-10-CM | POA: Diagnosis not present

## 2022-02-14 DIAGNOSIS — R5381 Other malaise: Secondary | ICD-10-CM | POA: Diagnosis not present

## 2022-02-14 DIAGNOSIS — G4700 Insomnia, unspecified: Secondary | ICD-10-CM | POA: Diagnosis present

## 2022-02-14 DIAGNOSIS — R4587 Impulsiveness: Secondary | ICD-10-CM | POA: Diagnosis present

## 2022-02-14 DIAGNOSIS — Z825 Family history of asthma and other chronic lower respiratory diseases: Secondary | ICD-10-CM

## 2022-02-14 DIAGNOSIS — Z930 Tracheostomy status: Secondary | ICD-10-CM | POA: Diagnosis not present

## 2022-02-14 DIAGNOSIS — K219 Gastro-esophageal reflux disease without esophagitis: Secondary | ICD-10-CM | POA: Diagnosis present

## 2022-02-14 DIAGNOSIS — R32 Unspecified urinary incontinence: Secondary | ICD-10-CM | POA: Diagnosis not present

## 2022-02-14 DIAGNOSIS — Z86718 Personal history of other venous thrombosis and embolism: Secondary | ICD-10-CM

## 2022-02-14 DIAGNOSIS — Z7409 Other reduced mobility: Secondary | ICD-10-CM | POA: Diagnosis present

## 2022-02-14 DIAGNOSIS — E119 Type 2 diabetes mellitus without complications: Secondary | ICD-10-CM | POA: Diagnosis present

## 2022-02-14 DIAGNOSIS — Z79899 Other long term (current) drug therapy: Secondary | ICD-10-CM | POA: Diagnosis not present

## 2022-02-14 DIAGNOSIS — I672 Cerebral atherosclerosis: Secondary | ICD-10-CM | POA: Diagnosis not present

## 2022-02-14 DIAGNOSIS — I69891 Dysphagia following other cerebrovascular disease: Secondary | ICD-10-CM | POA: Diagnosis not present

## 2022-02-14 DIAGNOSIS — E1165 Type 2 diabetes mellitus with hyperglycemia: Secondary | ICD-10-CM | POA: Diagnosis not present

## 2022-02-14 DIAGNOSIS — A0472 Enterocolitis due to Clostridium difficile, not specified as recurrent: Secondary | ICD-10-CM | POA: Diagnosis present

## 2022-02-14 DIAGNOSIS — G936 Cerebral edema: Secondary | ICD-10-CM | POA: Diagnosis not present

## 2022-02-14 LAB — GLUCOSE, CAPILLARY
Glucose-Capillary: 144 mg/dL — ABNORMAL HIGH (ref 70–99)
Glucose-Capillary: 174 mg/dL — ABNORMAL HIGH (ref 70–99)
Glucose-Capillary: 171 mg/dL — ABNORMAL HIGH (ref 70–99)
Glucose-Capillary: 125 mg/dL — ABNORMAL HIGH (ref 70–99)

## 2022-02-14 MED ORDER — PNEUMOCOCCAL 20-VAL CONJ VACC 0.5 ML IM SUSY
0.5000 mL | PREFILLED_SYRINGE | INTRAMUSCULAR | Status: DC
  Filled 2022-02-14: qty 0.5

## 2022-02-14 MED ORDER — ESCITALOPRAM OXALATE 10 MG PO TABS
20.0000 mg | ORAL_TABLET | Freq: Every day | ORAL | Status: DC
  Administered 2022-02-15 – 2022-02-19 (×5): 20 mg via ORAL
  Administered 2022-02-20 (×2): 10 mg via ORAL
  Administered 2022-02-21 – 2022-03-07 (×15): 20 mg via ORAL
  Filled 2022-02-14 (×21): qty 2

## 2022-02-14 MED ORDER — APIXABAN 5 MG PO TABS
5.0000 mg | ORAL_TABLET | Freq: Two times a day (BID) | ORAL | Status: DC
  Administered 2022-02-14 – 2022-03-07 (×42): 5 mg via ORAL
  Filled 2022-02-14 (×42): qty 1

## 2022-02-14 MED ORDER — MAGNESIUM HYDROXIDE 400 MG/5ML PO SUSP: 30.0000 mL | Freq: Every day | ORAL | Status: DC | PRN

## 2022-02-14 MED ORDER — PROCHLORPERAZINE 25 MG RE SUPP: 12.5000 mg | Freq: Four times a day (QID) | RECTAL | Status: DC | PRN

## 2022-02-14 MED ORDER — LISINOPRIL 20 MG PO TABS
40.0000 mg | ORAL_TABLET | Freq: Every day | ORAL | Status: DC
  Administered 2022-02-15 – 2022-03-07 (×21): 40 mg via ORAL
  Filled 2022-02-14 (×21): qty 2

## 2022-02-14 MED ORDER — METHOCARBAMOL 500 MG PO TABS: 500.0000 mg | ORAL_TABLET | Freq: Four times a day (QID) | ORAL | Status: DC | PRN

## 2022-02-14 MED ORDER — METOPROLOL TARTRATE 25 MG PO TABS
25.0000 mg | ORAL_TABLET | Freq: Two times a day (BID) | ORAL | Status: DC
  Administered 2022-02-14 – 2022-03-07 (×42): 25 mg via ORAL
  Filled 2022-02-14 (×42): qty 1

## 2022-02-14 MED ORDER — BISACODYL 5 MG PO TBEC: 5.0000 mg | DELAYED_RELEASE_TABLET | Freq: Every day | ORAL | Status: DC | PRN

## 2022-02-14 MED ORDER — FLEET ENEMA 7-19 GM/118ML RE ENEM: 1.0000 | ENEMA | Freq: Once | RECTAL | Status: DC | PRN

## 2022-02-14 MED ORDER — POTASSIUM CHLORIDE IN NACL 20-0.9 MEQ/L-% IV SOLN
INTRAVENOUS | Status: DC
  Filled 2022-02-14 (×3): qty 1000

## 2022-02-14 MED ORDER — CLONIDINE HCL 0.3 MG/24HR TD PTWK
0.3000 mg | MEDICATED_PATCH | TRANSDERMAL | Status: DC
  Administered 2022-02-18 – 2022-03-04 (×3): 0.3 mg via TRANSDERMAL
  Filled 2022-02-14 (×3): qty 1

## 2022-02-14 MED ORDER — MELATONIN 3 MG PO TABS
3.0000 mg | ORAL_TABLET | Freq: Every evening | ORAL | Status: DC | PRN
  Administered 2022-02-19 – 2022-03-07 (×5): 3 mg via ORAL
  Filled 2022-02-14 (×5): qty 1

## 2022-02-14 MED ORDER — ALUM & MAG HYDROXIDE-SIMETH 200-200-20 MG/5ML PO SUSP: 30.0000 mL | ORAL | Status: DC | PRN

## 2022-02-14 MED ORDER — PROCHLORPERAZINE MALEATE 5 MG PO TABS: 5.0000 mg | ORAL_TABLET | Freq: Four times a day (QID) | ORAL | Status: DC | PRN

## 2022-02-14 MED ORDER — PROCHLORPERAZINE EDISYLATE 10 MG/2ML IJ SOLN: 5.0000 mg | Freq: Four times a day (QID) | INTRAMUSCULAR | Status: DC | PRN

## 2022-02-14 MED ORDER — GUAIFENESIN-DM 100-10 MG/5ML PO SYRP: 5.0000 mL | ORAL_SOLUTION | Freq: Four times a day (QID) | ORAL | Status: DC | PRN

## 2022-02-14 MED ORDER — INSULIN ASPART 100 UNIT/ML IJ SOLN
0.0000 [IU] | Freq: Three times a day (TID) | INTRAMUSCULAR | Status: DC
  Administered 2022-02-14: 4 [IU] via SUBCUTANEOUS
  Administered 2022-02-15 (×2): 3 [IU] via SUBCUTANEOUS
  Administered 2022-02-16 (×2): 4 [IU] via SUBCUTANEOUS
  Administered 2022-02-16 – 2022-02-17 (×2): 3 [IU] via SUBCUTANEOUS
  Administered 2022-02-17: 7 [IU] via SUBCUTANEOUS
  Administered 2022-02-17 – 2022-02-18 (×3): 4 [IU] via SUBCUTANEOUS
  Administered 2022-02-18: 3 [IU] via SUBCUTANEOUS
  Administered 2022-02-19: 7 [IU] via SUBCUTANEOUS
  Administered 2022-02-19: 3 [IU] via SUBCUTANEOUS
  Administered 2022-02-19 – 2022-02-20 (×2): 4 [IU] via SUBCUTANEOUS
  Administered 2022-02-20: 7 [IU] via SUBCUTANEOUS
  Administered 2022-02-20: 3 [IU] via SUBCUTANEOUS
  Administered 2022-02-21 – 2022-02-23 (×7): 4 [IU] via SUBCUTANEOUS
  Administered 2022-02-23: 7 [IU] via SUBCUTANEOUS
  Administered 2022-02-23 – 2022-02-24 (×2): 4 [IU] via SUBCUTANEOUS
  Administered 2022-02-24 – 2022-02-25 (×3): 7 [IU] via SUBCUTANEOUS
  Administered 2022-02-26: 4 [IU] via SUBCUTANEOUS
  Administered 2022-02-26: 7 [IU] via SUBCUTANEOUS
  Administered 2022-02-27: 3 [IU] via SUBCUTANEOUS
  Administered 2022-02-27: 4 [IU] via SUBCUTANEOUS
  Administered 2022-02-27: 7 [IU] via SUBCUTANEOUS
  Administered 2022-02-28 (×2): 4 [IU] via SUBCUTANEOUS
  Administered 2022-03-01 (×2): 3 [IU] via SUBCUTANEOUS
  Administered 2022-03-01: 4 [IU] via SUBCUTANEOUS
  Administered 2022-03-02: 3 [IU] via SUBCUTANEOUS
  Administered 2022-03-02 (×2): 4 [IU] via SUBCUTANEOUS
  Administered 2022-03-03: 7 [IU] via SUBCUTANEOUS
  Administered 2022-03-03 – 2022-03-05 (×4): 4 [IU] via SUBCUTANEOUS
  Administered 2022-03-05: 7 [IU] via SUBCUTANEOUS
  Administered 2022-03-05: 3 [IU] via SUBCUTANEOUS
  Administered 2022-03-06: 7 [IU] via SUBCUTANEOUS
  Administered 2022-03-06: 3 [IU] via SUBCUTANEOUS
  Administered 2022-03-06 – 2022-03-07 (×2): 4 [IU] via SUBCUTANEOUS

## 2022-02-14 MED ORDER — OXYCODONE-ACETAMINOPHEN 5-325 MG PO TABS: 1.0000 | ORAL_TABLET | ORAL | Status: DC | PRN

## 2022-02-14 MED ORDER — TRAZODONE HCL 50 MG PO TABS: 25.0000 mg | ORAL_TABLET | Freq: Every evening | ORAL | Status: DC | PRN

## 2022-02-14 MED ORDER — INFLUENZA VAC SPLIT QUAD 0.5 ML IM SUSY
0.5000 mL | PREFILLED_SYRINGE | INTRAMUSCULAR | Status: DC
  Filled 2022-02-14: qty 0.5

## 2022-02-14 MED ORDER — AMLODIPINE BESYLATE 10 MG PO TABS
10.0000 mg | ORAL_TABLET | Freq: Every day | ORAL | Status: DC
  Administered 2022-02-15 – 2022-03-07 (×21): 10 mg via ORAL
  Filled 2022-02-14 (×21): qty 1

## 2022-02-14 MED ORDER — HYDROCODONE-ACETAMINOPHEN 5-325 MG PO TABS: 1.0000 | ORAL_TABLET | ORAL | Status: DC | PRN

## 2022-02-14 MED ORDER — ACETAMINOPHEN 325 MG PO TABS: 325.0000 mg | ORAL_TABLET | ORAL | Status: DC | PRN

## 2022-02-14 MED ORDER — VANCOMYCIN 50 MG/ML ORAL SOLUTION
125.0000 mg | Freq: Four times a day (QID) | ORAL | Status: AC
  Administered 2022-02-14 – 2022-02-22 (×31): 125 mg via ORAL
  Filled 2022-02-14 (×33): qty 2.5

## 2022-02-14 NOTE — H&P (Signed)
Physical Medicine and Rehabilitation Admission H&P     CC: Functional deficits secondary to prior CVA with decompressive craniectomy s/p cranioplasty 02/09/2022   HPI: Jacob [name] is a 46 year old male with history of right MCA occlusion s/p tenecteplase and interventional radiology with TICI3 who required decompressive craniectomy by Dr. Lovell Sheehan on 4/24. He presented for elective cranioplasty. History is also significant for right peroneal DVT on Eliquis 5 mg daily. He also required tracheostomy for prolonged respiratory failure with tracheostomy placed 5/1. His deficits include left hemiplegia from his prior stroke. He developed loose stools on POD #1 and stool was positive for C. diff toxin. He is on oral vancomycin. KUB unremarkable. Tolerating carb modified diet. The patient requires inpatient physical medicine and rehabilitation evaluations and treatment secondary to dysfunction due to prior CVA with left hemiplegia.    Review of Systems  Constitutional:  Negative for chills and fever.  HENT:  Negative for hearing loss and sore throat.   Eyes:  Negative for blurred vision and double vision.  Respiratory:  Negative for cough and shortness of breath.   Cardiovascular:  Negative for chest pain and palpitations.  Gastrointestinal:  Positive for diarrhea. Negative for nausea and vomiting.  Genitourinary:  Negative for dysuria and urgency.  Neurological:  Positive for headaches. Negative for dizziness.        Past Medical History:  Diagnosis Date   Allergy     Diabetes mellitus without complication (HCC)     GERD (gastroesophageal reflux disease)     Hypertension     Paralysis (HCC)     Stroke [facility])           Past Surgical History:  Procedure Laterality Date   APPENDECTOMY       BUBBLE STUDY   10/02/2021    Procedure: BUBBLE STUDY;  Surgeon: Pricilla Riffle, MD;  Location: [facility] ENDOSCOPY;  Service: Cardiovascular;;   CRANIOPLASTY Right 02/09/2022    Procedure: Craniectomy with  Replacement of Bone Flap From the Abdomen;  Surgeon: Tressie Stalker, MD;  Location: [facility] OR;  Service: Neurosurgery;  Laterality: Right;   CRANIOTOMY Right 09/15/2021    Procedure: RIGHT DECOMPRESSIVE CRANIOTOMY, PLACEMENT OF BONE FLAP IN ABDOMEN;  Surgeon: Tressie Stalker, MD;  Location: [facility] OR;  Service: Neurosurgery;  Laterality: Right;   IR CT HEAD LTD   09/12/2021   IR PERCUTANEOUS ART THROMBECTOMY/INFUSION INTRACRANIAL INC DIAG ANGIO   09/12/2021   IR US GUIDE VASC ACCESS RIGHT   09/12/2021   LAPAROSCOPIC APPENDECTOMY   10/24/2013    Procedure: APPENDECTOMY LAPAROSCOPIC;  Surgeon: Kandis Cocking, MD;  Location: [facility] ORS;  Service: General;;   RADIOLOGY WITH ANESTHESIA N/A 09/12/2021    Procedure: IR WITH ANESTHESIA;  Surgeon: Julieanne Cotton, MD;  Location: [facility] OR;  Service: Radiology;  Laterality: N/A;   TEE WITHOUT CARDIOVERSION N/A 10/02/2021    Procedure: TRANSESOPHAGEAL ECHOCARDIOGRAM (TEE);  Surgeon: Pricilla Riffle, MD;  Location: [facility] ENDOSCOPY;  Service: Cardiovascular;  Laterality: N/A;         Family History  Problem Relation Age of Onset   Asthma Son      Social History:  reports that he has never smoked. He has never used smokeless tobacco. He reports that he does not currently use alcohol. He reports that he does not use drugs. Allergies:       Allergies  Allergen Reactions   Hydrochlorothiazide Other (See Comments)      Bilateral muscle cramping  Medications Prior to Admission  Medication Sig Dispense Refill   acetaminophen (TYLENOL) 325 MG tablet Take 650 mg by mouth every 12 (twelve) hours as needed for moderate pain.       amLODipine (NORVASC) 10 MG tablet Place 1 tablet (10 mg total) into feeding tube daily. (Patient taking differently: Take 10 mg by mouth daily.) 30 tablet 1   apixaban (ELIQUIS) 5 MG TABS tablet Take 1 tablet (5 mg total) by mouth 2 (two) times daily. 60 tablet 1   atorvastatin (LIPITOR) 40 MG tablet Place 1 tablet (40 mg total) into feeding tube  daily. (Patient taking differently: Take 40 mg by mouth daily.) 30 tablet 1   cloNIDine (CATAPRES - DOSED IN MG/24 HR) 0.3 mg/24hr patch Place 1 patch (0.3 mg total) onto the skin once a week. 4 patch 12   Ensure Plus (ENSURE PLUS) LIQD Take 237 mLs by mouth in the morning, at noon, in the evening, and at bedtime.       escitalopram (LEXAPRO) 20 MG tablet Place 1 tablet (20 mg total) into feeding tube daily. (Patient taking differently: Take 20 mg by mouth daily.) 30 tablet 1   famotidine (PEPCID) 20 MG tablet Take 20 mg by mouth 2 (two) times daily.       HUMALOG KWIKPEN 100 UNIT/ML KwikPen Inject 0-10 Units into the skin every 4 (four) hours. Blood glucose of 150 and below = 0 units, 151-200= 2 units, 201-250= 4 units, 251-300= 6 units, 301-350= 8 units, 351-400= 10 units, 401--999 If CBG is >400 NOTIFY MD/NP       insulin glargine (LANTUS) 100 UNIT/ML injection Inject 17 Units into the skin every 12 (twelve) hours.       lisinopril (ZESTRIL) 40 MG tablet Place 1 tablet (40 mg total) into feeding tube daily. 30 tablet 1   melatonin 3 MG TABS tablet Take 3 mg by mouth at bedtime.       metoprolol tartrate (LOPRESSOR) 25 MG tablet Take 25 mg by mouth 2 (two) times daily.       oxyCODONE (OXY IR/ROXICODONE) 5 MG immediate release tablet Take 2.5 mg by mouth every 6 (six) hours as needed for pain.       polyethylene glycol (MIRALAX / GLYCOLAX) 17 g packet Place 17 g into feeding tube daily as needed for mild constipation, moderate constipation or severe constipation. 14 each 0   topiramate (TOPAMAX) 50 MG tablet Place 1 tablet (50 mg total) into feeding tube 2 (two) times daily. (Patient taking differently: Take 50 mg by mouth every 12 (twelve) hours.) 60 tablet 1   traZODone (DESYREL) 50 MG tablet Take 25 mg by mouth at bedtime.       acetaminophen (TYLENOL) 160 MG/5ML solution Place 20.3 mLs (650 mg total) into feeding tube every 4 (four) hours as needed for mild pain (or temp > 37.5 C (99.5 F)).  (Patient not taking: Reported on 12/31/2021) 120 mL 0   albuterol (PROVENTIL) (2.5 MG/3ML) 0.083% nebulizer solution Take 3 mLs (2.5 mg total) by nebulization every 6 (six) hours as needed for wheezing or shortness of breath. (Patient not taking: Reported on 12/08/2021) 75 mL 12   bisacodyl (DULCOLAX) 10 MG suppository Place 1 suppository (10 mg total) rectally daily at 2 PM. (Patient not taking: Reported on 12/08/2021) 12 suppository 0   carvedilol (COREG) 25 MG tablet Place 1 tablet (25 mg total) into feeding tube 2 (two) times daily with a meal. (Patient not taking: Reported on 12/08/2021) 30 tablet  1   chlorproMAZINE (THORAZINE) 10 MG tablet Place 1 tablet (10 mg total) into feeding tube 3 (three) times daily as needed for hiccoughs. (Patient not taking: Reported on 12/08/2021) 30 tablet 1   insulin aspart (NOVOLOG) 100 UNIT/ML injection Inject 8 Units into the skin 4 (four) times daily. (Patient not taking: Reported on 12/31/2021) 10 mL 11   insulin detemir (LEVEMIR) 100 UNIT/ML injection Inject 0.5 mLs (50 Units total) into the skin 2 (two) times daily. (Patient not taking: Reported on 12/08/2021) 10 mL 11   linezolid (ZYVOX) 600 MG tablet Place 1 tablet (600 mg total) into feeding tube every 12 (twelve) hours. (Patient not taking: Reported on 12/08/2021) 7 tablet 0   ondansetron (ZOFRAN) 4 MG/2ML SOLN injection Inject 2 mLs (4 mg total) into the vein every 4 (four) hours as needed for nausea or vomiting. (Patient not taking: Reported on 12/31/2021) 2 mL 0   pantoprazole sodium (PROTONIX) 40 mg Place 40 mg into feeding tube daily. (Patient not taking: Reported on 12/08/2021) 30 packet 1          Home: Home Living Family/patient expects to be discharged to:: Skilled nursing facility Living Arrangements: Spouse/significant other, Children Available Help at Discharge: Family, Available 24 hours/day Type of Home: House Home Access: Ramped entrance Home Layout: Able to live on main level with  bedroom/bathroom, Multi-level Alternate Level Stairs-Number of Steps: full flight Alternate Level Stairs-Rails: Left Bathroom Shower/Tub: Health visitor: Standard Bathroom Accessibility: Yes Home Equipment: Agricultural consultant (2 wheels), Wheelchair - manual, Hospital bed, Other (comment) Additional Comments: reclining wheelchair in the room  Lives With: Spouse, Family   Functional History: Prior Function Prior Level of Function : Needs assist Mobility Comments: reports was at rehab at [facility]; recently home and wife assisting with transfers to wheelchair and pt wearing briefs due to incontinence; daughter also assisting when wife at work, but had recently applied for and starting to interview for an aide ADLs Comments: mod-total A for ADLs at bed or WC level. wife and daughter assissting.   Functional Status:  Mobility: Bed Mobility Overal bed mobility: Needs Assistance Bed Mobility: Supine to Sit Rolling: Mod assist, +2 for physical assistance Supine to sit: Mod assist, +2 for safety/equipment, HOB elevated General bed mobility comments: increased time to initiate with multimodal cues and assist for L LE, to lift trunk and help scoot hips Transfers Overall transfer level: Needs assistance Equipment used: 1 person hand held assist Transfers: Sit to/from Stand, Bed to chair/wheelchair/BSC Sit to Stand: Min assist, Mod assist Bed to/from chair/wheelchair/BSC transfer type:: Stand pivot Stand pivot transfers: Mod assist, +2 safety/equipment General transfer comment: up to stand initially from EOB at elevated height with min A, then pivot to [facility] with mod A +2 for safety with lines and managing cords; to recliner mod A sit to stand from toilet and pt remaining flexed and leaning on PT progressing to max A for balance while OT moved BSC and placed recliner   ADL: ADL Overall ADL's : Needs assistance/impaired Eating/Feeding: Set up, Sitting Grooming: Set up, Min  guard, Sitting Upper Body Bathing: Moderate assistance, Sitting Lower Body Bathing: Maximal assistance, Sit to/from stand, +2 for physical assistance, +2 for safety/equipment Upper Body Dressing : Sitting, Moderate assistance Lower Body Dressing: Maximal assistance, Sit to/from stand Toilet Transfer: Moderate assistance, BSC/3in1 Toileting- Clothing Manipulation and Hygiene: Maximal assistance, +2 for physical assistance Functional mobility during ADLs: Moderate assistance General ADL Comments: limited by L hemi, impaired cog, decreased activity tolernace  Cognition: Cognition Overall Cognitive Status: Impaired/Different from baseline Orientation Level: Oriented X4 Cognition Arousal/Alertness: Awake/alert Behavior During Therapy: WFL for tasks assessed/performed Overall Cognitive Status: Impaired/Different from baseline Area of Impairment: Attention, Following commands, Problem solving Current Attention Level: Sustained Following Commands: Follows one step commands inconsistently, Follows one step commands with increased time Problem Solving: Decreased initiation, Slow processing General Comments: required significantly increased time for processing and multiple cues. Follow one step directions well, difficulty with 2+ steps   Physical Exam: Blood pressure (!) 150/93, pulse 85, temperature 99.1 F (37.3 C), temperature source Oral, resp. rate 19, height 6\' 1"  (1.854 m), weight 81.6 kg, SpO2 100 %. Physical Exam Constitutional:      General: He is not in acute distress. HENT:     Head:     Comments: Staple line intact with mild soft tissue swelling    Nose: Nose normal.     Mouth/Throat:     Pharynx: Oropharynx is clear.     Comments: Poor dentition Eyes:     Extraocular Movements: Extraocular movements intact.     Pupils: Pupils are equal, round, and reactive to light.  Cardiovascular:     Rate and Rhythm: Normal rate and regular rhythm.     Heart sounds: No murmur  heard. Pulmonary:     Effort: Pulmonary effort is normal. No respiratory distress.     Breath sounds: No wheezing.  Abdominal:     General: Bowel sounds are normal. There is no distension.     Palpations: Abdomen is soft.     Tenderness: There is no abdominal tenderness.     Comments: Right transverse abdominal incision healing without signs of infection  Musculoskeletal:        General: No swelling.     Cervical back: Normal range of motion.  Skin:    General: Skin is warm and dry.  Neurological:     Mental Status: He is alert and oriented to person, place, and time.     Comments: Pt is alert and oriented. Limited insight and awareness. Could provide some biographical information but struggled completing thoughts and ideas. Left central 7 with dysarthria.Left homonymous hemianopsia +/- intattention.  LUE 0/5 prox to distal with tight elbow, wrist and finger flexors. LLE quads 2-, HAD 1+, hamstrings trace, ankle 0/5. RUE and RLE are 4-5/5 prox to distal. Seems to sense pain and light touch on left. DTR's brisk on left. LUE appears to be more contracture than increased tone. LLE fairly loose.   Psychiatric:        Mood and Affect: Mood normal.        Behavior: Behavior normal.        Lab Results Last 48 Hours        Results for orders placed or performed during the hospital encounter of 02/09/22 (from the past 48 hour(s))  Glucose, capillary     Status: Abnormal    Collection Time: 02/11/22  9:33 PM  Result Value Ref Range    Glucose-Capillary 151 (H) 70 - 99 mg/dL      Comment: Glucose reference range applies only to samples taken after fasting for at least 8 hours.    Comment 1 Notify RN      Comment 2 Document in Chart    Glucose, capillary     Status: Abnormal    Collection Time: 02/12/22  7:43 AM  Result Value Ref Range    Glucose-Capillary 149 (H) 70 - 99 mg/dL      Comment:  Glucose reference range applies only to samples taken after fasting for at least 8 hours.  Glucose,  capillary     Status: Abnormal    Collection Time: 02/12/22 11:19 AM  Result Value Ref Range    Glucose-Capillary 171 (H) 70 - 99 mg/dL      Comment: Glucose reference range applies only to samples taken after fasting for at least 8 hours.  Glucose, capillary     Status: Abnormal    Collection Time: 02/12/22  4:52 PM  Result Value Ref Range    Glucose-Capillary 168 (H) 70 - 99 mg/dL      Comment: Glucose reference range applies only to samples taken after fasting for at least 8 hours.  Glucose, capillary     Status: Abnormal    Collection Time: 02/12/22  9:09 PM  Result Value Ref Range    Glucose-Capillary 141 (H) 70 - 99 mg/dL      Comment: Glucose reference range applies only to samples taken after fasting for at least 8 hours.  Glucose, capillary     Status: Abnormal    Collection Time: 02/13/22  7:48 AM  Result Value Ref Range    Glucose-Capillary 130 (H) 70 - 99 mg/dL      Comment: Glucose reference range applies only to samples taken after fasting for at least 8 hours.  Glucose, capillary     Status: Abnormal    Collection Time: 02/13/22 11:29 AM  Result Value Ref Range    Glucose-Capillary 152 (H) 70 - 99 mg/dL      Comment: Glucose reference range applies only to samples taken after fasting for at least 8 hours.       Imaging Results (Last 48 hours)  DG Abd Portable 1V   Result Date: 02/12/2022 CLINICAL DATA:  C difficile.  Change in stool. EXAM: PORTABLE ABDOMEN - 1 VIEW COMPARISON:  Abdominal radiograph dated 10/02/2021 and 09/23/2021 and CT dated 10/23/2013. FINDINGS: There is moderate stool throughout the colon. No bowel dilatation or evidence of obstruction. No free air radiopaque calculi. Cutaneous clips over the right lower abdomen. Degenerative changes of the hips. No acute osseous pathology. IMPRESSION: Moderate colonic stool burden. No bowel obstruction. Electronically Signed   By: Elgie Collard M.D.   On: 02/12/2022 18:17          Blood pressure (!) 150/93,  pulse 85, temperature 99.1 F (37.3 C), temperature source Oral, resp. rate 19, height  (1.854 m), weight 81.6 kg, SpO2 100 %.   Medical Problem List and Plan: 1. Functional deficits secondary to debility after cranioplasty and C-Diff in setting of prior right MCA stroke, left hemiplegia             -patient may not yet shower             -ELOS/Goals: 12-14 days, mod assist with PT, OT, SLP             -pt has a resting WHO for LUE             -not sure he needs a PRAFO LLE--reassess in am 2.  Antithrombotics: -DVT/anticoagulation:  Pharmaceutical: Eliquis             -antiplatelet therapy: none 3. Pain Management: Tylenol, oxycodone, hydrocodone, Robaxin as needed 4. Mood/Behavior/Sleep: LCSW to evaluate and provide emotional support             -antipsychotic agents: n/a             -depression:  continue Lexapro 20 mg daily             -insomnia: continue melatonin 5. Neuropsych/cognition: This patient is capable of making decisions on his own behalf. 6. Skin/Wound Care: routine skin care checks             -monitor scalp and abdominal surgical incisions             -scalp clean and intact currently 7. Fluids/Electrolytes/Nutrition: Routine Is and Os and follow-up chemistries             -carb modified diet 8: DM: Hgb A1c = 8.1; on SSI             -consider restarting home Lantus and Novolog             -monitor cbg's for pattern first 9: Hypertension: monitor             -continue clonidine patch 0.3 mg weekly             -continue amlodipine 10 mg daily             -continue Lopressor 25 mg BID             -continue lisinopril 40 mg daily             -bp sl elevated today 10: C. diff diarrhea: continue oral vanc, end 9/30; discontinue Colace for now             -had mushy bm early this morning             -pt states that stools are beginning to firm up   Barbie Banner, PA-C 02/13/2022   I have personally performed a face to face diagnostic evaluation of this patient  and formulated the key components of the plan.  Additionally, I have personally reviewed laboratory data, imaging studies, as well as relevant notes and concur with the physician assistant's documentation above.  The patient's status has not changed from the original H&P.  Any changes in documentation from the acute care chart have been noted above.  Meredith Staggers, MD, Mell[facility] Drown

## 2022-02-14 NOTE — Discharge Instructions (Signed)
No lifting > 15 lbs Resume Eliquis on 02/16/22

## 2022-02-14 NOTE — Discharge Summary (Signed)
  Physician Discharge Summary  Patient ID: ADYEN BIFULCO MRN: [id number] DOB/AGE: 1975/08/29 46 y.o.  Admit date: 02/09/2022 Discharge date: 02/14/2022  Admission Diagnoses:  Cranial defect  Discharge Diagnoses:  S/p repair of cranial defect Principal Problem:   Status post craniectomy   Discharged Condition: Stable  Hospital Course:  Jacob [name] is a 46 y.o. male with a history of right CVA status post craniectomy who underwent elective right cranioplasty on 9/18.  Postoperatively, he was mobilized and he was tolerating a regular diet.  His neurologic exam was unchanged.  He worked with physical therapy and was deemed a good candidate for rehab.  Rehab had a bed available for him on 923 and he was deemed ready for discharge.  Treatments: Surgery -right cranioplasty  Discharge Exam: Blood pressure (!) 151/98, pulse 86, temperature 98.8 F (37.1 C), temperature source Oral, resp. rate 18, height 6\' 1"  (1.854 m), weight 81.6 kg, SpO2 100 %. Awake, alert, oriented Speech fluent, appropriate CN grossly intact Full strength right upper extremity and lower extremity, plegic on left. Stapled wound c/d/i  Disposition: Discharge disposition: [place] Not Defined          Follow-up Information     Newman Pies, MD Follow up.   Specialty: Neurosurgery Contact information: [address] [place] [address] [phone]                 Signed: Vallarie Mare 02/14/2022, 11:15 AM

## 2022-02-14 NOTE — Progress Notes (Signed)
Inpatient Rehabilitation Admission Medication Review by a Pharmacist  A complete drug regimen review was completed for this patient to identify any potential clinically significant medication issues.  High Risk Drug Classes Is patient taking? Indication by Medication  Antipsychotic Yes Compazine- N/V  Anticoagulant Yes Apixaban-afib  Antibiotic Yes Vanc PO- C. diff  Opioid Yes Oxycodone, norco- pain  Antiplatelet No   Hypoglycemics/insulin Yes Aspart-DM  Vasoactive Medication Yes Lisinopril, clonidine, metoprolol, amlodipine-bp  Chemotherapy No   Other Yes Methocarbamol-muscle spasms Trazodone- depression Lexapro-depression Melatonin-sleep     Type of Medication Issue Identified Description of Issue Recommendation(s)  Drug Interaction(s) (clinically significant)     Duplicate Therapy     Allergy     No Medication Administration End Date     Incorrect Dose     Additional Drug Therapy Needed     Significant med changes from prior encounter (inform family/care partners about these prior to discharge). topamax Restart PTA meds when and if necessary during CIR admission or at time of discharge, if warranted   Other  Reported home meds patient not taking: detemir, zofran, pepcid, coreg, thorazine     Clinically significant medication issues were identified that warrant physician communication and completion of prescribed/recommended actions by midnight of the next day:  No  Name of provider notified for urgent issues identified:   Provider Method of Notification:     Pharmacist comments:   Time spent performing this drug regimen review (minutes):  15 minutes   Sandford Craze, PharmD. [facility] Acute Care PGY-1  02/14/2022 2:45 PM

## 2022-02-14 NOTE — Progress Notes (Signed)
Inpatient Rehab Admissions Coordinator:  There is a bed available in CIR for pt today. Dr. Glenford Peers is aware and in agreement. Pt, pt's wife, NSG, and TOC made aware.   Gayland Curry, [place], [place] Admissions Coordinator [phone]

## 2022-02-14 NOTE — Progress Notes (Signed)
Signed     PMR Admission Coordinator Pre-Admission Assessment   Patient: Jacob [name] is an 46 y.o., male MRN: [id number] DOB: 04/23/1976 Height: 6' 1"  (185.4 cm) Weight: 81.6 kg   Insurance Information HMO:     PPO:      PCP:      IPA:      80/20:      OTHER:  PRIMARY: Pharmacologist (to term 1/54)   Policy#: [id number]      Subscriber: patient CM Name: Leeroy Bock      Phone#: [phone] Fax#: [phone] Pre-Cert#: [id number]  Received approval 02/13/22. Pt approved for 6 days. Updates due on 02/20/22    Employer:  Benefits:  Phone #: [phone]     Name:  Eff. Date: 05/25/20     Deduct: $1500 (met $15000      Out of Pocket Max: $4000 (met $1500)      Life Max: N/A CIR: 85%      SNF: 85% with 60 days max/year Outpatient: 85%     Co-Pay: 15% Home Health: 85%      Co-Pay: 15% DME: 85%     Co-Pay: 15% Providers: in network  SECONDARY: [employer]     (Will be primary 92/4)  Policy#: [id number]     Phone#: [phone]   Financial Counselor:       Phone#:    The "Data Collection Information Summary" for patients in Inpatient Rehabilitation Facilities with attached "Privacy Act [place] Records" was provided and verbally reviewed with:    Emergency Contact Information Contact Information       Name Relation Home Work Mobile    [place] Spouse     [phone]    Claudean Kinds [phone]               Current Medical History  Patient Admitting Diagnosis: S/p cranioplasty History of Present Illness: Patient is a 46 year old male with PMH of HTN and DM, also had R peroneal DVT on whom neurosurgery performed a right decompressive craniectomy on 09/15/2021 after a cerebrovascular accident. Pt. Previously admitted with R MCA/PCA infarct on 09/12/21 The patient has recovered from his stroke, with [facility] and then SNF placement. Discharged home from SNF 2 weeks ago.  He is left hemiplegic.  His craniectomy flap is sunken and the patient and  his wife have requested to have it replaced. Pt. Underwent cranioplasty 9/18 with Dr. Arnoldo Morale.Admitted to ICU following and PT/OT saw Pt. With recommendations for CIR to assist return to PLOF.    Patient's medical record from [facility] has been reviewed by the rehabilitation admission coordinator and physician.   Past Medical History      Past Medical History:  Diagnosis Date   Allergy     Diabetes mellitus without complication ([place])     GERD (gastroesophageal reflux disease)     Hypertension     Paralysis ([place])     Stroke [facility])        Has the patient had major surgery during 100 days prior to admission? Yes   Family History   family history includes Asthma in his son.   Current Medications   Current Facility-Administered Medications:    0.9 % NaCl with KCl 20 mEq/ L  infusion, , Intravenous, Continuous, Newman Pies, MD, Last Rate: 75 mL/hr at 02/10/22 1657, Infusion Verify at 02/10/22 1657   acetaminophen (TYLENOL) tablet 650 mg, 650 mg, Oral, Q4H PRN **OR** acetaminophen (TYLENOL) suppository 650 mg, 650 mg, Rectal, Q4H PRN, Arnoldo Morale,  Dellis Filbert, MD   amLODipine (NORVASC) tablet 10 mg, 10 mg, Oral, Daily, Newman Pies, MD, 10 mg at 02/11/22 0946   Chlorhexidine Gluconate Cloth 2 % PADS 6 each, 6 each, Topical, Q0600, Newman Pies, MD, 6 each at 02/11/22 0401   cloNIDine (CATAPRES - Dosed in mg/24 hr) patch 0.3 mg, 0.3 mg, Transdermal, Weekly, Newman Pies, MD   docusate sodium (COLACE) capsule 100 mg, 100 mg, Oral, BID, Newman Pies, MD   escitalopram (LEXAPRO) tablet 20 mg, 20 mg, Oral, Daily, Newman Pies, MD, 20 mg at 02/11/22 0945   hydrALAZINE (APRESOLINE) tablet 5-20 mg, 5-20 mg, Oral, Q4H PRN, Fenton Malling L, NP, 20 mg at 02/11/22 0355   HYDROcodone-acetaminophen (NORCO/VICODIN) 5-325 MG per tablet 1 tablet, 1 tablet, Oral, Q4H PRN, Newman Pies, MD, 1 tablet at 02/10/22 1651   insulin aspart (novoLOG) injection 0-20 Units,  0-20 Units, Subcutaneous, TID WC, Bergman, Meghan D, NP, 4 Units at 02/11/22 0944   labetalol (NORMODYNE) injection 10-40 mg, 10-40 mg, Intravenous, Q10 min PRN, Newman Pies, MD, 40 mg at 02/10/22 2320   lisinopril (ZESTRIL) tablet 40 mg, 40 mg, Oral, Daily, Newman Pies, MD   metoprolol tartrate (LOPRESSOR) tablet 25 mg, 25 mg, Oral, BID, Newman Pies, MD, 25 mg at 02/11/22 0945   mupirocin ointment (BACTROBAN) 2 % 1 Application, 1 Application, Nasal, BID, Newman Pies, MD, 1 Application at 67/54/49 0945   ondansetron (ZOFRAN) tablet 4 mg, 4 mg, Oral, Q4H PRN **OR** ondansetron (ZOFRAN) injection 4 mg, 4 mg, Intravenous, Q4H PRN, Newman Pies, MD, 4 mg at 02/10/22 1657   oxyCODONE-acetaminophen (PERCOCET/ROXICET) 5-325 MG per tablet 1 tablet, 1 tablet, Oral, Q4H PRN, Newman Pies, MD, 1 tablet at 02/11/22 0356   promethazine (PHENERGAN) tablet 12.5-25 mg, 12.5-25 mg, Oral, Q4H PRN, Newman Pies, MD   Patients Current Diet:  Diet Order                  Diet Carb Modified Fluid consistency: Thin; Room service appropriate? Yes  Diet effective now                         Precautions / Restrictions Precautions Precautions: Fall Precaution Comments: L hemiplegia Restrictions Weight Bearing Restrictions: No    Has the patient had 2 or more falls or a fall with injury in the past year? No   Prior Activity Level Household: Pt went out for appts   Prior Functional Level Self Care: Did the patient need help bathing, dressing, using the toilet or eating? Independent   Indoor Mobility: Did the patient need assistance with walking from room to room (with or without device)? Independent   Stairs: Did the patient need assistance with internal or external stairs (with or without device)? Independent   Functional Cognition: Did the patient need help planning regular tasks such as shopping or remembering to take medications? Independent   Patient Information Are  you of Hispanic, Latino/a,or Spanish origin?: A. No, not of Hispanic, Latino/a, or Spanish origin (answers from proxy) What is your race?: B. Black or African American Do you need or want an interpreter to communicate with a doctor or health care staff?: 1. Yes   Patient's Response To:  Health Literacy and Transportation Is the patient able to respond to health literacy and transportation needs?: No Health Literacy - How often do you need to have someone help you when you read instructions, pamphlets, or other written material from your doctor or pharmacy?: Patient unable  to respond In the past 12 months, has lack of transportation kept you from medical appointments or from getting medications?: Yes (Answers from proxy) In the past 12 months, has lack of transportation kept you from meetings, work, or from getting things needed for daily living?: Yes   Home Assistive Devices / Equipment Home Equipment: Wheelchair - manual   Prior Device Use: Indicate devices/aids used by the patient prior to current illness, exacerbation or injury? Manual wheelchair   Current Functional Level Cognition   Overall Cognitive Status: Difficult to assess Orientation Level: Oriented to person, Oriented to place, Oriented to time General Comments: not formally tested, pt with HR 140's upon my entry and noted soiled, RN in to assist with hygiene and for delivering meds and pt also had emesis    Extremity Assessment (includes Sensation/Coordination)   Upper Extremity Assessment: LUE deficits/detail LUE Deficits / Details: noted L UE extended and with limited movement by his side, but not formally tested  Lower Extremity Assessment: LLE deficits/detail LLE Deficits / Details: not lifting to command when in supine, not formally tested due to medical issues     ADLs         Mobility   Overal bed mobility: Needs Assistance Bed Mobility: Rolling Rolling: Mod assist, +2 for physical assistance General bed  mobility comments: rolling for hygiene in the bed due to watery stool and to change linens as pt had emesis in the bed as well     Transfers   General transfer comment: NT, HR elevation and frequent watery stools     Ambulation / Gait / Stairs / Wheelchair Mobility         Posture / Balance       Special needs/care consideration Skin Surgical incision: abdomen; Head/posterior,right, Continuous/IVPB: 0.9% NaCl with Kcl 20 mEq/L infusion, Diabetic Management: novoLOG 0-20 unites 3x daily with meals, External urinary catheter and Special service needs Enteric precautions    Previous Home Environment (from acute therapy documentation) Living Arrangements: Spouse/significant other  Lives With: Spouse, Family Available Help at Discharge: Family, Available 24 hours/day Type of Home: House Home Layout: Able to live on main level with bedroom/bathroom, Multi-level Alternate Level Stairs-Rails: Left Alternate Level Stairs-Number of Steps: full flight Home Access: Ramped entrance Bathroom Shower/Tub: Multimedia programmer: Standard Bathroom Accessibility: Yes How Accessible: Accessible via wheelchair, Accessible via walker Home Care Services: Yes Type of Home Care Services: Home PT, Home OT Additional Comments: reclining wheelchair in the room   Discharge Living Setting Plans for Discharge Living Setting: Patient's home Type of Home at Discharge: House Discharge Home Layout: Multi-level, Able to live on main level with bedroom/bathroom Alternate Level Stairs-Rails: Right, Left Alternate Level Stairs-Number of Steps: fullflight Discharge Home Access: Ramped entrance Discharge Bathroom Shower/Tub: Walk-in shower Discharge Bathroom Toilet: Handicapped height Discharge Bathroom Accessibility: Yes How Accessible: Accessible via walker, Accessible via wheelchair   Social/Family/Support Systems Patient Roles: Spouse Contact Information: [phone] Anticipated Caregiver:  [place] Ability/Limitations of Caregiver: Min A Caregiver Availability: 24/7 Discharge Plan Discussed with Primary Caregiver: Yes Is Caregiver In Agreement with Plan?: No Does Caregiver/Family have Issues with Lodging/Transportation while Pt is in Rehab?: No   Goals Patient/Family Goal for Rehab: PT/OT/SLP Mod A Expected length of stay: 12-14 days Pt/Family Agrees to Admission and willing to participate: Yes Program Orientation Provided & Reviewed with Pt/Caregiver Including Roles  & Responsibilities: Yes   Decrease burden of Care through IP rehab admission: Not anticipated   Possible need for SNF placement upon discharge:  None   Patient Condition: I have reviewed medical records from [facility], spoken with CM, and patient. I met with patient at the bedside for inpatient rehabilitation assessment.  Patient will benefit from ongoing PT and OT, can actively participate in 3 hours of therapy a day 5 days of the week, and can make measurable gains during the admission.  Patient will also benefit from the coordinated team approach during an Inpatient Acute Rehabilitation admission.  The patient will receive intensive therapy as well as Rehabilitation physician, nursing, social worker, and care management interventions.  Due to safety, skin/wound care, disease management, medication administration, pain management, and patient education the patient requires 24 hour a day rehabilitation nursing.  The patient is currently Min-Mod A with mobility and Min-Max A with basic ADLs.  Discharge setting and therapy post discharge at home with home health is anticipated.  Patient has agreed to participate in the Acute Inpatient Rehabilitation Program and will admit today.   Preadmission Screen Completed By:  Genella Mech, 02/11/2022 10:14 AM and updated by Karene Fry on 02/12/22 at 1610, day of admission updates by Gayland Curry ______________________________________________________________________   Discussed status with Dr. Naaman Plummer on 02/14/22  at 9:06 AM and received approval for admission today.   Admission Coordinator:  Genella Mech, CCC-SLP, with day of admission updates by Gayland Curry time 9:06 AM/Date 02/14/22     Assessment/Plan: Diagnosis: debility after cranioplasty, prior left HP d/t right CVA Does the need for close, 24 hr/day Medical supervision in concert with the patient's rehab needs make it unreasonable for this patient to be served in a less intensive setting? Yes Co-Morbidities requiring supervision/potential complications: c diff, nutrition, pain and wound care, dm, htn Due to bladder management, bowel management, safety, skin/wound care, disease management, medication administration, pain management, and patient education, does the patient require 24 hr/day rehab nursing? Yes Does the patient require coordinated care of a physician, rehab nurse, PT, OT, and SLP to address physical and functional deficits in the context of the above medical diagnosis(es)? Yes Addressing deficits in the following areas: balance, endurance, locomotion, strength, transferring, bowel/bladder control, bathing, dressing, feeding, grooming, toileting, cognition, and psychosocial support Can the patient actively participate in an intensive therapy program of at least 3 hrs of therapy 5 days a week? Yes The potential for patient to make measurable gains while on inpatient rehab is good Anticipated functional outcomes upon discharge from inpatient rehab: mod assist PT, mod assist OT, mod assist SLP Estimated rehab length of stay to reach the above functional goals is: 12-14 days Anticipated discharge destination: Home 10. Overall Rehab/Functional Prognosis: good     MD Signature: Meredith Staggers, MD, [place] Director Rehabilitation  Services 02/14/2022

## 2022-02-15 DIAGNOSIS — A0472 Enterocolitis due to Clostridium difficile, not specified as recurrent: Secondary | ICD-10-CM | POA: Diagnosis not present

## 2022-02-15 DIAGNOSIS — F329 Major depressive disorder, single episode, unspecified: Secondary | ICD-10-CM

## 2022-02-15 DIAGNOSIS — E119 Type 2 diabetes mellitus without complications: Secondary | ICD-10-CM | POA: Diagnosis not present

## 2022-02-15 DIAGNOSIS — I10 Essential (primary) hypertension: Secondary | ICD-10-CM | POA: Diagnosis not present

## 2022-02-15 DIAGNOSIS — R5381 Other malaise: Secondary | ICD-10-CM | POA: Diagnosis not present

## 2022-02-15 LAB — GLUCOSE, CAPILLARY
Glucose-Capillary: 128 mg/dL — ABNORMAL HIGH (ref 70–99)
Glucose-Capillary: 139 mg/dL — ABNORMAL HIGH (ref 70–99)
Glucose-Capillary: 100 mg/dL — ABNORMAL HIGH (ref 70–99)
Glucose-Capillary: 219 mg/dL — ABNORMAL HIGH (ref 70–99)

## 2022-02-15 LAB — BASIC METABOLIC PANEL
Anion gap: 10 (ref 5–15)
BUN: <5 mg/dL — ABNORMAL LOW (ref 6–20)
CO2: 26 mmol/L (ref 22–32)
Calcium: 9.1 mg/dL (ref 8.9–10.3)
Chloride: 105 mmol/L (ref 98–111)
Creatinine, Ser: 0.42 mg/dL — ABNORMAL LOW (ref 0.61–1.24)
GFR, Estimated: >60 mL/min (ref >60)
Glucose, Bld: 193 mg/dL — ABNORMAL HIGH (ref 70–99)
Potassium: 3 mmol/L — ABNORMAL LOW (ref 3.5–5.1)
Sodium: 141 mmol/L (ref 135–145)

## 2022-02-15 LAB — CBC
HCT: 29.1 % — ABNORMAL LOW (ref 39.0–52.0)
Hemoglobin: 9.8 g/dL — ABNORMAL LOW (ref 13.0–17.0)
MCH: 27.3 pg (ref 26.0–34.0)
MCHC: 33.7 g/dL (ref 30.0–36.0)
MCV: 81.1 fL (ref 80.0–100.0)
Platelets: 209 10*3/uL (ref 150–400)
RBC: 3.59 MIL/uL — ABNORMAL LOW (ref 4.22–5.81)
RDW: 15.6 % — ABNORMAL HIGH (ref 11.5–15.5)
WBC: 7.5 10*3/uL (ref 4.0–10.5)
nRBC: 0 % (ref 0.0–0.2)

## 2022-02-15 MED ORDER — POTASSIUM CHLORIDE CRYS ER 20 MEQ PO TBCR
20.0000 meq | EXTENDED_RELEASE_TABLET | Freq: Every day | ORAL | Status: DC
  Administered 2022-02-15 – 2022-02-18 (×4): 20 meq via ORAL
  Filled 2022-02-15 (×4): qty 1

## 2022-02-15 NOTE — Plan of Care (Signed)
  Problem: RH Swallowing Goal: LTG Patient will consume least restrictive diet using compensatory strategies with assistance (SLP) Description: LTG:  Patient will consume least restrictive diet using compensatory strategies with assistance (SLP) Flowsheets (Taken 02/15/2022 1602) LTG: Pt Patient will consume least restrictive diet using compensatory strategies with assistance of (SLP): Supervision   Problem: RH Problem Solving Goal: LTG Patient will demonstrate problem solving for (SLP) Description: LTG:  Patient will demonstrate problem solving for basic/complex daily situations with cues  (SLP) Flowsheets (Taken 02/15/2022 1602) LTG: Patient will demonstrate problem solving for (SLP): Basic daily situations LTG Patient will demonstrate problem solving for: Minimal Assistance - Patient > 75%   Problem: RH Memory Goal: LTG Patient will demonstrate ability for day to day (SLP) Description: LTG:   Patient will demonstrate ability for day to day recall/carryover during cognitive/linguistic activities with assist  (SLP) Flowsheets (Taken 02/15/2022 1602) LTG: Patient will demonstrate ability for day to day recall:  New information  Daily complex information LTG: Patient will demonstrate ability for day to day recall/carryover during cognitive/linguistic activities with assist (SLP): Supervision Goal: LTG Patient will use memory compensatory aids to (SLP) Description: LTG:  Patient will use memory compensatory aids to recall biographical/new, daily complex information with cues (SLP) Flowsheets (Taken 02/15/2022 1602) LTG: Patient will use memory compensatory aids to (SLP): Supervision   Problem: RH Attention Goal: LTG Patient will demonstrate this level of attention during functional activites (SLP) Description: LTG:  Patient will will demonstrate this level of attention during functional activites (SLP) Flowsheets (Taken 02/15/2022 1602) Patient will demonstrate during cognitive/linguistic  activities the attention type of: Sustained LTG: Patient will demonstrate this level of attention during cognitive/linguistic activities with assistance of (SLP): Supervision   Problem: RH Awareness Goal: LTG: Patient will demonstrate awareness during functional activites type of (SLP) Description: LTG: Patient will demonstrate awareness during functional activites type of (SLP) Flowsheets (Taken 02/15/2022 1602) Patient will demonstrate during cognitive/linguistic activities awareness type of: Emergent LTG: Patient will demonstrate awareness during cognitive/linguistic activities with assistance of (SLP): Minimal Assistance - Patient > 75%

## 2022-02-15 NOTE — Progress Notes (Signed)
Orthopedic Tech Progress Note Patient Details:  Jacob [name] 09-10-1975 [id number]  Patient ID: [name], male   DOB: Feb 11, 1976, 46 y.o.   MRN: [id number] Order placed with [facility] for rehab prafo boot. Vernona Rieger 02/15/2022, 8:58 AM

## 2022-02-15 NOTE — Progress Notes (Signed)
Holding NS with K until further orders per Dr. Naaman Plummer. Will pass on to day shift nurse. Patient in bed with call bell within reach.

## 2022-02-15 NOTE — Plan of Care (Signed)
Problem: RH Balance Goal: LTG: Patient will maintain dynamic sitting balance (OT) Description: LTG:  Patient will maintain dynamic sitting balance with assistance during activities of daily living (OT) Flowsheets (Taken 02/15/2022 1624) LTG: Pt will maintain dynamic sitting balance during ADLs with: Supervision/Verbal cueing Goal: LTG Patient will maintain dynamic standing with ADLs (OT) Description: LTG:  Patient will maintain dynamic standing balance with assist during activities of daily living (OT)  Flowsheets (Taken 02/15/2022 1624) LTG: Pt will maintain dynamic standing balance during ADLs with: Minimal Assistance - Patient > 75%   Problem: Sit to Stand Goal: LTG:  Patient will perform sit to stand in prep for activites of daily living with assistance level (OT) Description: LTG:  Patient will perform sit to stand in prep for activites of daily living with assistance level (OT) Flowsheets (Taken 02/15/2022 1624) LTG: PT will perform sit to stand in prep for activites of daily living with assistance level: Contact Guard/Touching assist   Problem: RH Eating Goal: LTG Patient will perform eating w/assist, cues/equip (OT) Description: LTG: Patient will perform eating with assist, with/without cues using equipment (OT) Flowsheets (Taken 02/15/2022 1624) LTG: Pt will perform eating with assistance level of: Supervision/Verbal cueing   Problem: RH Grooming Goal: LTG Patient will perform grooming w/assist,cues/equip (OT) Description: LTG: Patient will perform grooming with assist, with/without cues using equipment (OT) Flowsheets (Taken 02/15/2022 1624) LTG: Pt will perform grooming with assistance level of: Supervision/Verbal cueing   Problem: RH Bathing Goal: LTG Patient will bathe all body parts with assist levels (OT) Description: LTG: Patient will bathe all body parts with assist levels (OT) Flowsheets (Taken 02/15/2022 1624) LTG: Pt will perform bathing with assistance level/cueing:  Minimal Assistance - Patient > 75% LTG: Position pt will perform bathing: Shower   Problem: RH Dressing Goal: LTG Patient will perform upper body dressing (OT) Description: LTG Patient will perform upper body dressing with assist, with/without cues (OT). Flowsheets (Taken 02/15/2022 1624) LTG: Pt will perform upper body dressing with assistance level of: Minimal Assistance - Patient > 75% Goal: LTG Patient will perform lower body dressing w/assist (OT) Description: LTG: Patient will perform lower body dressing with assist, with/without cues in positioning using equipment (OT) Flowsheets (Taken 02/15/2022 1624) LTG: Pt will perform lower body dressing with assistance level of: Moderate Assistance - Patient 50 - 74%   Problem: RH Toileting Goal: LTG Patient will perform toileting task (3/3 steps) with assistance level (OT) Description: LTG: Patient will perform toileting task (3/3 steps) with assistance level (OT)  Flowsheets (Taken 02/15/2022 1624) LTG: Pt will perform toileting task (3/3 steps) with assistance level: Minimal Assistance - Patient > 75%   Problem: RH Vision Goal: RH LTG Vision Consulting civil engineer) Flowsheets (Taken 02/15/2022 1624) LTG: Vision Goals: Patient will utilize effective strategies to visually scan to left to locate needed supplies/ items within context of familiar functional activities with only initial prompt   Problem: RH Functional Use of Upper Extremity Goal: LTG Patient will use RT/LT upper extremity as a (OT) Description: LTG: Patient will use right/left upper extremity as a stabilizer/gross assist/diminished/nondominant/dominant level with assist, with/without cues during functional activity (OT) Flowsheets (Taken 02/15/2022 1624) LTG: Use of upper extremity in functional activities: LUE as a stabilizer LTG: Pt will use upper extremity in functional activity with assistance level of: Minimal Assistance - Patient > 75%   Problem: RH Toilet Transfers Goal: LTG Patient  will perform toilet transfers w/assist (OT) Description: LTG: Patient will perform toilet transfers with assist, with/without cues using equipment (OT) Flowsheets (  Taken 02/15/2022 1624) LTG: Pt will perform toilet transfers with assistance level of: Minimal Assistance - Patient > 75%   Problem: RH Tub/Shower Transfers Goal: LTG Patient will perform tub/shower transfers w/assist (OT) Description: LTG: Patient will perform tub/shower transfers with assist, with/without cues using equipment (OT) Flowsheets (Taken 02/15/2022 1624) LTG: Pt will perform tub/shower stall transfers with assistance level of: Minimal Assistance - Patient > 75% LTG: Pt will perform tub/shower transfers from: (TBD) --   Problem: RH Memory Goal: LTG Patient will demonstrate ability for day to day recall/carry over during activities of daily living with assistance level (OT) Description: LTG:  Patient will demonstrate ability for day to day recall/carry over during activities of daily living with assistance level (OT). Flowsheets (Taken 02/15/2022 1624) LTG:  Patient will demonstrate ability for day to day recall/carry over during activities of daily living with assistance level (OT): Minimal Assistance - Patient > 75%   Problem: RH Attention Goal: LTG Patient will demonstrate this level of attention during functional activites (OT) Description: LTG:  Patient will demonstrate this level of attention during functional activites  (OT) Flowsheets (Taken 02/15/2022 1624) Patient will demonstrate this level of attention during functional activites: Selective Patient will demonstrate above attention level in the following environment: Home LTG: Patient will demonstrate this level of attention during functional activites (OT): Minimal Assistance - Patient > 75%   Problem: RH Awareness Goal: LTG: Patient will demonstrate awareness during functional activites type of (OT) Description: LTG: Patient will demonstrate awareness during  functional activites type of (OT) Flowsheets (Taken 02/15/2022 1624) Patient will demonstrate awareness during functional activites type of: Emergent LTG: Patient will demonstrate awareness during functional activites type of (OT): Minimal Assistance - Patient > 75%

## 2022-02-15 NOTE — Evaluation (Signed)
Occupational Therapy Assessment and Plan  Patient Details  Name: TAL KEMPKER MRN: [id number] Date of Birth: [birth year]  OT Diagnosis: abnormal posture, acute pain, altered mental status, apraxia, cognitive deficits, disturbance of vision, hemiplegia affecting non-dominant side, muscular wasting and disuse atrophy, pain in joint, and swelling of limb Rehab Potential: Rehab Potential (ACUTE ONLY): Good ELOS: 14-18 days   Today's Date: 02/15/2022 OT Individual Time: 0800-0920 OT Individual Time Calculation (min): 80 min     Hospital Problem: Principal Problem:   Debility   Past Medical History:  Past Medical History:  Diagnosis Date   Allergy    Diabetes mellitus without complication ([place])    GERD (gastroesophageal reflux disease)    Hypertension    Paralysis ([place])    Stroke [facility])    Past Surgical History:  Past Surgical History:  Procedure Laterality Date   APPENDECTOMY     BUBBLE STUDY  10/02/2021   Procedure: BUBBLE STUDY;  Surgeon: Fay Records, MD;  Location: [place];  Service: Cardiovascular;;   CRANIOPLASTY Right 02/09/2022   Procedure: Craniectomy with Replacement of Bone Flap From the Abdomen;  Surgeon: Newman Pies, MD;  Location: [place];  Service: Neurosurgery;  Laterality: Right;   CRANIOTOMY Right 09/15/2021   Procedure: RIGHT DECOMPRESSIVE CRANIOTOMY, PLACEMENT OF BONE FLAP IN ABDOMEN;  Surgeon: Newman Pies, MD;  Location: [place];  Service: Neurosurgery;  Laterality: Right;   IR CT HEAD LTD  09/12/2021   IR PERCUTANEOUS ART THROMBECTOMY/INFUSION INTRACRANIAL INC DIAG ANGIO  09/12/2021   IR US GUIDE VASC ACCESS RIGHT  09/12/2021   LAPAROSCOPIC APPENDECTOMY  10/24/2013   Procedure: APPENDECTOMY LAPAROSCOPIC;  Surgeon: Shann Medal, MD;  Location: [facility];  Service: General;;   RADIOLOGY WITH ANESTHESIA N/A 09/12/2021   Procedure: IR WITH ANESTHESIA;  Surgeon: Luanne Bras, MD;  Location: [place];  Service: Radiology;  Laterality: N/A;   TEE WITHOUT  CARDIOVERSION N/A 10/02/2021   Procedure: TRANSESOPHAGEAL ECHOCARDIOGRAM (TEE);  Surgeon: Fay Records, MD;  Location: [facility] END[facility];  Service: Cardiovascular;  Laterality: N/A;    Assessment & Plan Clinical Impression: Patient is a 46 year old male with history of right MCA occlusion s/p tenecteplase and interventional radiology with TICI3 who required decompressive craniectomy by Dr. Arnoldo Morale on 4/24. He presented for elective cranioplasty. History is also significant for right peroneal DVT on Eliquis 5 mg daily. He also required tracheostomy for prolonged respiratory failure with tracheostomy placed 5/1. His deficits include left hemiplegia from his prior stroke. He developed loose stools on POD #1 and stool was positive for C. diff toxin. He is on oral vancomycin. KUB unremarkable. Tolerating carb modified diet. The patient requires inpatient physical medicine and rehabilitation evaluations and treatment secondary to dysfunction due to prior CVA with left hemiplegia.  Patient transferred to CIR on 02/14/2022 .    Patient currently requires max with basic self-care skills secondary to muscle weakness and muscle joint tightness, impaired timing and sequencing, abnormal tone, unbalanced muscle activation, motor apraxia, decreased coordination, and decreased motor planning, decreased visual acuity, decreased visual perceptual skills, decreased visual motor skills, field cut, and hemianopsia, decreased midline orientation, decreased attention to left, and decreased motor planning, decreased attention, decreased awareness, decreased problem solving, decreased safety awareness, decreased memory, and delayed processing, and decreased sitting balance, decreased standing balance, decreased postural control, hemiplegia, and decreased balance strategies.  Prior to initial hospitalization in April, patient could complete  BADL/IADL with independent .  Patient will benefit from skilled intervention to decrease level of  assist with basic self-care skills prior to discharge home with care partner.  Anticipate patient will require minimal physical assistance and follow up outpatient.  OT - End of Session Activity Tolerance: Tolerates 30+ min activity with multiple rests Endurance Deficit: Yes OT Assessment Rehab Potential (ACUTE ONLY): Good OT Patient demonstrates impairments in the following area(s): Balance;Vision;Behavior;Pain;Cognition;Perception;Safety;Endurance;Sensory;Edema;Motor OT Basic ADL's Functional Problem(s): Eating;Grooming;Bathing;Dressing;Toileting OT Transfers Functional Problem(s): Toilet;Tub/Shower OT Additional Impairment(s): Fuctional Use of Upper Extremity OT Plan OT Intensity: Minimum of 1-2 x/day, 45 to 90 minutes OT Frequency: 5 out of 7 days OT Duration/Estimated Length of Stay: 14-18 days OT Treatment/Interventions: Balance/vestibular training;Neuromuscular re-education;Patient/family education;Self Care/advanced ADL retraining;Splinting/orthotics;Therapeutic Exercise;UE/LE Coordination activities;Wheelchair propulsion/positioning;Visual/perceptual remediation/compensation;UE/LE Strength taining/ROM;Therapeutic Activities;Pain management;Functional mobility training;DME/adaptive equipment instruction;Discharge planning;Cognitive remediation/compensation OT Self Feeding Anticipated Outcome(s): supervision OT Basic Self-Care Anticipated Outcome(s): Min assist OT Toileting Anticipated Outcome(s): Min assist OT Bathroom Transfers Anticipated Outcome(s): Min assist OT Recommendation Patient destination: Home Follow Up Recommendations: Outpatient OT Equipment Recommended: None recommended by OT Equipment Details: Will monitor - currently patient reports he has a BSC and shower seat.  Patient has obtained a reclinig w/c   OT Evaluation Precautions/Restrictions  Precautions Precautions: Fall Precaution Comments: L hemiplegia; painful LUE/shoulder; Required Braces or Orthoses:  Splint/Cast Splint/Cast: WHO - Resting hand splint at night Restrictions Weight Bearing Restrictions: No   Pain Pain Assessment Pain Scale: 0-10 Pain Score: 0-No pain Home Living/Prior Functioning Home Living Living Arrangements: Spouse/significant other, Children Available Help at Discharge: Family, Available 24 hours/day Type of Home: House Home Access: Ramped entrance Home Layout: Able to live on main level with bedroom/bathroom, Multi-level Alternate Level Stairs-Number of Steps: full flight Alternate Level Stairs-Rails: Left Bathroom Shower/Tub: Multimedia programmer: Standard Bathroom Accessibility: Yes Additional Comments: reclining wheelchair in the room, has hospital bed at home  Lives With: Spouse, Family IADL History Current License: Yes Mode of Transportation: Printmaker Education: 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 - 40+ hours a week) Vision Baseline Vision/History: 0 No visual deficits Ability to See in Adequate Light: 3 Highly impaired Patient Visual Report: Nausea/blurring vision with head movement;Peripheral vision impairment Vision Assessment?: Yes Eye Alignment: Within Functional Limits Ocular Range of Motion: Impaired-to be further tested in functional context (has movement in all quadrants - yet lacks smooth controlled eye movement) Alignment/Gaze Preference: Gaze right Tracking/Visual Pursuits: Decreased smoothness of vertical tracking;Decreased smoothness of  horizontal tracking;Requires cues, head turns, or add eye shifts to track;Unable to hold eye position out of midline Saccades: Additional head turns occurred during testing;Decreased speed of saccadic movement;Additional eye shifts occurred during testing Visual Fields: Left homonymous hemianopsia;Left visual field deficit Diplopia Assessment:  (does not display or report diplopia during OT eval - has been noted on acute - continue to monitor) Depth Perception: Overshoots;Undershoots Additional Comments: Left field cut/ hemianopsia Perception  Perception: Impaired Inattention/Neglect: Does not attend to left side of body;Does not attend to left visual field Spatial Orientation: Poor midline awareness, posterior bias in sitting Praxis Praxis: Impaired Praxis Impairment Details: Initiation;Motor planning Cognition Cognition Overall Cognitive Status: Impaired/Different from baseline Arousal/Alertness: Awake/alert Orientation Level: Person;Place;Situation Person: Oriented Place:  Oriented Situation: Oriented Memory: Impaired Memory Impairment: Retrieval deficit;Storage deficit Attention: Sustained Sustained Attention: Impaired Sustained Attention Impairment: Functional basic;Verbal complex Awareness: Impaired Awareness Impairment: Intellectual impairment Problem Solving: Impaired Problem Solving Impairment: Functional basic;Verbal basic Behaviors: Other (comment) (hyperverbal but not physically impulsive) Safety/Judgment: Impaired Brief Interview for Mental Status (BIMS) Repetition of Three Words (First Attempt): 3 Temporal Orientation: Year: Correct Temporal Orientation: Month: Missed by 6 days to 1 month Temporal Orientation: Day: Incorrect Recall: "Sock": Yes, no cue required Recall: "Blue": Yes, no cue required Recall: "Bed": Yes, no cue required BIMS Summary Score: 13 Sensation Sensation Light Touch: Impaired Detail Peripheral sensation comments: Inconsistent response to  LUE - patient inattentive to left - but also having difficulty following directions. Light Touch Impaired Details: Impaired LUE;Impaired LLE Hot/Cold: Appears Intact Proprioception: Impaired by gross assessment Stereognosis: Not tested Coordination Gross Motor Movements are Fluid and Coordinated: No Fine Motor Movements are Fluid and Coordinated: No Coordination and Movement Description: dense L hemiplegia Motor  Motor Motor: Hemiplegia;Abnormal tone;Motor apraxia;Abnormal postural alignment and control  Trunk/Postural Assessment  Cervical Assessment Cervical Assessment: Exceptions to [facility] (forward head) Thoracic Assessment Thoracic Assessment: Exceptions to [facility] (flattened anterior chest wall) Lumbar Assessment Lumbar Assessment: Exceptions to [facility] (posterior pelvis) Postural Control Postural Control: Deficits on evaluation (delayed / decreased)  Balance Balance Balance Assessed: Yes Static Sitting Balance Static Sitting - Balance Support: Feet supported;Right upper extremity supported Static Sitting - Level of Assistance: 5: Stand by assistance Dynamic Sitting Balance Dynamic Sitting - Balance Support: Feet supported;Right upper extremity supported Dynamic Sitting - Level of Assistance: 4: Min assist Dynamic Sitting - Balance Activities: Reaching for objects;Forward lean/weight shifting Sitting balance - Comments: 1 UE support and CGA to min A Static Standing Balance Static Standing - Balance Support: During functional activity;Right upper extremity supported;Left upper extremity supported Static Standing - Level of Assistance: 3: Mod assist Dynamic Standing Balance Dynamic Standing - Balance Support: During functional activity;Bilateral upper extremity supported Dynamic Standing - Level of Assistance: 3: Mod assist Dynamic Standing - Balance Activities: Forward lean/weight shifting Extremity/Trunk Assessment RUE Assessment RUE Assessment: Within Functional Limits LUE  Assessment LUE Assessment: Exceptions to [facility] Passive Range of Motion (PROM) Comments: Painful left shoulder - inferior / anterior subluxation, lacks external rotation Active Range of Motion (AROM) Comments: None noted on OT eval General Strength Comments: NT LUE Body System: Neuro Brunstrum levels for arm and hand: Arm;Hand Brunstrum level for arm:  (Patient with muscle shortening in shoulder, elbow and digits) Brunstrum level for hand:  (Not flaccid - increased tension maintained in digits - does not have full PROM into flex/ext)  Care Tool Care Tool Self Care Eating   Eating Assist Level: Moderate Assistance - Patient 50 - 74%    Oral Care    Oral Care Assist Level: Moderate Assistance - Patient 50 - 74%    Bathing     Body parts bathed by helper: Right arm;Left arm;Chest;Abdomen;Front perineal area;Buttocks;Left lower leg;Right lower leg;Left upper leg;Right upper leg;Face   Assist Level: Dependent - Patient 0%    Upper Body Dressing(including orthotics)   What is the patient wearing?: Pull over shirt   Assist Level: Maximal Assistance - Patient 25 - 49%    Lower Body Dressing (excluding footwear)   What is the patient wearing?: Pants;Incontinence brief Assist for lower body dressing: Total Assistance - Patient < 25%    Putting on/Taking off footwear   What is the patient wearing?: Shoes;Non-skid slipper socks Assist for footwear: Total Assistance - Patient <  25%       Care Tool Toileting Toileting activity Toileting Activity did not occur (Clothing management and hygiene only): N/A (no void or bm)       Care Tool Bed Mobility Roll left and right activity   Roll left and right assist level: Moderate Assistance - Patient 50 - 74%    Sit to lying activity   Sit to lying assist level: Moderate Assistance - Patient 50 - 74%    Lying to sitting on side of bed activity   Lying to sitting on side of bed assist level: the ability to move from lying on the back to sitting  on the side of the bed with no back support.: Maximal Assistance - Patient 25 - 49%     Care Tool Transfers Sit to stand transfer   Sit to stand assist level: Maximal Assistance - Patient 25 - 49%    Chair/bed transfer   Chair/bed transfer assist level: Moderate Assistance - Patient 50 - 74%     Toilet transfer Toilet transfer activity did not occur: Refused (declined need x 2)       Care Tool Cognition  Expression of Ideas and Wants Expression of Ideas and Wants: 3. Some difficulty - exhibits some difficulty with expressing needs and ideas (e.g, some words or finishing thoughts) or speech is not clear  Understanding Verbal and Non-Verbal Content Understanding Verbal and Non-Verbal Content: 3. Usually understands - understands most conversations, but misses some part/intent of message. Requires cues at times to understand   Memory/Recall Ability Memory/Recall Ability : That he or she is in a hospital/hospital unit;Current season   Refer to Care Plan for Long Term Goals  SHORT TERM GOAL WEEK 1 OT Short Term Goal 1 (Week 1): Patient will don pull over shirt with no more than mod assist OT Short Term Goal 2 (Week 1): Patient will bathe face, chest, abdomen, upper thighs and below knees following set up and cueing with min assist OT Short Term Goal 3 (Week 1): Patient will transfer to commode with min assist OT Short Term Goal 4 (Week 1): Patient will complete oral care with min assist OT Short Term Goal 5 (Week 1): Patient will tolerate passive stretch to LUE to 90* of shoulder flexion, and at least 20* of external rotation without significant increase in report of pain.  Recommendations for other services: Neuropsych   Skilled Therapeutic Intervention Patient received supine in bed.  Patient hyperverbal this session, but pleasant and cooperative.  Able to self report his medical course to date.  Patient declined pain when asked initially, however when asked about LUE - patient  indicated pain 7/10 at rest.  Patient with significant stiffness throughout LUE - poorly aligned shoulder girdle - inferior/anterior subluxation - poor trunk control contributing to shoulder malaignment.  Patient also with decreased range of motion in digits - applied resting hand splint for passive stretch this am.  Patient assisted to edge of bed, and transferred to reclining wheelchair to complete dressing and hygiene skills - see below.   Patient needed coaxing to remain in wheelchair for less than 1 hour until next therapy session.  Patient left up in wheelchair with safety belt in place and engaged, and personal items and call bell within reach.     ADL ADL Eating: Minimal assistance Where Assessed-Eating: Wheelchair Grooming: Moderate assistance Where Assessed-Grooming: Sitting at sink Upper Body Bathing: Dependent Where Assessed-Upper Body Bathing: Bed level Lower Body Bathing: Dependent Where Assessed-Lower Body Bathing: Bed  level Upper Body Dressing: Maximal assistance Where Assessed-Upper Body Dressing: Wheelchair Lower Body Dressing: Maximal assistance Where Assessed-Lower Body Dressing: Wheelchair Toileting: Unable to assess Toilet Transfer: Unable to assess Toilet Transfer Method: Unable to assess Tub/Shower Transfer: Unable to assess Tub/Shower Transfer Method: Unable to assess Social research officer, government: Unable to assess Social research officer, government Method: Unable to assess [employer]: Shower seat with back ADL Comments: Patient reports he has shower seat at home - but he has not been able to get into shower Mobility  Bed Mobility Bed Mobility: Rolling Left;Supine to Sit Rolling Left: Moderate Assistance - Patient 50-74% Supine to Sit: Maximal Assistance - Patient - Patient 25-49% Transfers Sit to Stand: Moderate Assistance - Patient 50-74% Stand to Sit: Moderate Assistance - Patient 50-74%   Discharge Criteria: Patient will be discharged from OT if patient  refuses treatment 3 consecutive times without medical reason, if treatment goals not met, if there is a change in medical status, if patient makes no progress towards goals or if patient is discharged from hospital.  The above assessment, treatment plan, treatment alternatives and goals were discussed and mutually agreed upon: by patient  Mariah Milling 02/15/2022, 4:15 PM

## 2022-02-15 NOTE — Plan of Care (Signed)
Problem: RH Balance Goal: LTG Patient will maintain dynamic sitting balance (PT) Description: LTG:  Patient will maintain dynamic sitting balance with assistance during mobility activities (PT) Flowsheets (Taken 02/15/2022 1737) LTG: Pt will maintain dynamic sitting balance during mobility activities with:: Independent Goal: LTG Patient will maintain dynamic standing balance (PT) Description: LTG:  Patient will maintain dynamic standing balance with assistance during mobility activities (PT) Flowsheets (Taken 02/15/2022 1737) LTG: Pt will maintain dynamic standing balance during mobility activities with:: Minimal Assistance - Patient > 75%   Problem: Sit to Stand Goal: LTG:  Patient will perform sit to stand with assistance level (PT) Description: LTG:  Patient will perform sit to stand with assistance level (PT) Flowsheets (Taken 02/15/2022 1737) LTG: PT will perform sit to stand in preparation for functional mobility with assistance level: Contact Guard/Touching assist   Problem: RH Bed Mobility Goal: LTG Patient will perform bed mobility with assist (PT) Description: LTG: Patient will perform bed mobility with assistance, with/without cues (PT). Flowsheets (Taken 02/15/2022 1737) LTG: Pt will perform bed mobility with assistance level of: Minimal Assistance - Patient > 75%   Problem: RH Bed to Chair Transfers Goal: LTG Patient will perform bed/chair transfers w/assist (PT) Description: LTG: Patient will perform bed to chair transfers with assistance (PT). Flowsheets (Taken 02/15/2022 1737) LTG: Pt will perform Bed to Chair Transfers with assistance level: Contact Guard/Touching assist   Problem: RH Car Transfers Goal: LTG Patient will perform car transfers with assist (PT) Description: LTG: Patient will perform car transfers with assistance (PT). Flowsheets (Taken 02/15/2022 1737) LTG: Pt will perform car transfers with assist:: Minimal Assistance - Patient > 75%   Problem: RH  Furniture Transfers Goal: LTG Patient will perform furniture transfers w/assist (OT/PT) Description: LTG: Patient will perform furniture transfers  with assistance (OT/PT). Flowsheets (Taken 02/15/2022 1737) LTG: Pt will perform furniture transfers with assist:: Minimal Assistance - Patient > 75%   Problem: RH Ambulation Goal: LTG Patient will ambulate in controlled environment (PT) Description: LTG: Patient will ambulate in a controlled environment, # of feet with assistance (PT). Flowsheets (Taken 02/15/2022 1737) LTG: Pt will ambulate in controlled environ  assist needed:: Minimal Assistance - Patient > 75% LTG: Ambulation distance in controlled environment: >150 ft using LRAD Goal: LTG Patient will ambulate in home environment (PT) Description: LTG: Patient will ambulate in home environment, # of feet with assistance (PT). Flowsheets (Taken 02/15/2022 1737) LTG: Pt will ambulate in home environ  assist needed:: Minimal Assistance - Patient > 75% LTG: Ambulation distance in home environment: 50 ft using LRAD   Problem: RH Wheelchair Mobility Goal: LTG Patient will propel w/c in controlled environment (PT) Description: LTG: Patient will propel wheelchair in controlled environment, # of feet with assist (PT) Flowsheets (Taken 02/15/2022 1737) LTG: Pt will propel w/c in controlled environ  assist needed:: Supervision/Verbal cueing LTG: Propel w/c distance in controlled environment: >150 ft Goal: LTG Patient will propel w/c in home environment (PT) Description: LTG: Patient will propel wheelchair in home environment, # of feet with assistance (PT). Flowsheets (Taken 02/15/2022 1737) LTG: Pt will propel w/c in home environ  assist needed:: Set up assist LTG: Propel w/c distance in home environment: 50 ft   Problem: RH Attention Goal: LTG Patient will demonstrate this level of attention during functional activites (PT) Description: LTG:  Patient will demonstrate this level of attention during  functional activites (PT) 02/15/2022 1737 by Apolinar Junes L, PT Flowsheets (Taken 02/15/2022 1737) Patient will demonstrate this level of attention during functional  activites: Selective Patient will demonstrate above attention level in the following environment: Home LTG: Patient will demonstrate attention during functional mobility with assistance of: Minimal Assistance - Patient >75%

## 2022-02-15 NOTE — Progress Notes (Addendum)
PROGRESS NOTE   Subjective/Complaints: Pt had a pretty good night. Stools slowing down. Was incontinent of urine frequently but still has IVF running  ROS: Patient denies fever, rash, sore throat, blurred vision, dizziness, nausea, vomiting, diarrhea, cough, shortness of breath or chest pain, joint or back/neck pain,   or mood change.    Objective:   No results found. No results for input(s): "WBC", "HGB", "HCT", "PLT" in the last 72 hours. No results for input(s): "NA", "K", "CL", "CO2", "GLUCOSE", "BUN", "CREATININE", "CALCIUM" in the last 72 hours.  Intake/Output Summary (Last 24 hours) at 02/15/2022 0813 Last data filed at 02/15/2022 0744 Gross per 24 hour  Intake 234 ml  Output --  Net 234 ml        Physical Exam: Vital Signs Blood pressure (!) 158/86, pulse 79, temperature 98.7 F (37.1 C), temperature source Oral, resp. rate 18, height 6\' 1"  (1.854 m), weight 81 kg, SpO2 100 %.  General: Alert and oriented x 3, No apparent distress HEENT: Large cranioplasty incision CDI with associated swelling present on right. PERRLA, EOMI, sclera anicteric, oral mucosa pink and moist, poor dentition, ext ear canals clear Neck: Supple without JVD or lymphadenopathy Heart: Reg rate and rhythm. No murmurs rubs or gallops Chest: CTA bilaterally without wheezes, rales, or rhonchi; no distress Abdomen: Soft, non-tender, non-distended, bowel sounds positive. Extremities: No clubbing, cyanosis, or edema. Pulses are 2+ Psych: Pt's affect is appropriate. Pt is cooperative Skin: Clean and intact without signs of breakdown Neuro:  pt is alert and oriented. Fair insight and awareness. Follows basic commands but still with difficulty processing thoughts and carrying a consistent conversation. Left central 7, mild dysarthria. Left HH with inattention. LUE 0/5 prox to distal. LLE 1+ to 2/5 quads,HF; hamstrings and HAD 1/5; 0/5 ADF/PF.  Mild  finger flexor contractures left hand. LLE easily flexed to neutral. DTR's 2-3+ left side Musculoskeletal: tight wrist and fingers as above    Assessment/Plan: 1. Functional deficits which require 3+ hours per day of interdisciplinary therapy in a comprehensive inpatient rehab setting. Physiatrist is providing close team supervision and 24 hour management of active medical problems listed below. Physiatrist and rehab team continue to assess barriers to discharge/monitor patient progress toward functional and medical goals  Care Tool:  Bathing              Bathing assist       Upper Body Dressing/Undressing Upper body dressing        Upper body assist      Lower Body Dressing/Undressing Lower body dressing            Lower body assist       Toileting Toileting    Toileting assist       Transfers Chair/bed transfer  Transfers assist           Locomotion Ambulation   Ambulation assist              Walk 10 feet activity   Assist           Walk 50 feet activity   Assist           Walk 150 feet activity  Assist           Walk 10 feet on uneven surface  activity   Assist           Wheelchair     Assist               Wheelchair 50 feet with 2 turns activity    Assist            Wheelchair 150 feet activity     Assist          Blood pressure (!) 158/86, pulse 79, temperature 98.7 F (37.1 C), temperature source Oral, resp. rate 18, height 6\' 1"  (1.854 m), weight 81 kg, SpO2 100 %.  Medical Problem List and Plan: 1. Functional deficits secondary to debility after cranioplasty and C-Diff in setting of prior right MCA stroke, left hemiplegia             -patient may not yet shower             -ELOS/Goals: 12-14 days, mod assist with PT, OT, SLP             -pt has a resting WHO for LUE             -will go ahead and order PRAFO to maintain heel cord stretch  -Patient is beginning CIR  therapies today including PT, OT, and SLP  2.  Antithrombotics: -DVT/anticoagulation:  Pharmaceutical: Eliquis             -antiplatelet therapy: none 3. Pain Management: Tylenol, oxycodone, hydrocodone, Robaxin as needed 4. Mood/Behavior/Sleep: LCSW to evaluate and provide emotional support             -antipsychotic agents: n/a             -depression: continue Lexapro 20 mg daily             -insomnia: continue melatonin 5. Neuropsych/cognition: This patient is capable of making decisions on his own behalf. 6. Skin/Wound Care: routine skin care checks             -monitor scalp and abdominal surgical incisions             -scalp remains clean and intact currently 7. Fluids/Electrolytes/Nutrition:               -carb modified diet  -can dc IVF  9/24-Potassium 3.0--add supplement    -BUN/Cr wnl    -encourage PO 8: DM: Hgb A1c = 8.1; on SSI             -consider restarting home Lantus and Novolog             -CBG (last 3)  Recent Labs    02/14/22 1647 02/14/22 2107 02/15/22 0616  GLUCAP 171* 125* 128*    -9/24 fair control. Hold scheduled insulin for now 9: Hypertension: monitor             -continue clonidine patch 0.3 mg weekly             -continue amlodipine 10 mg daily             -continue Lopressor 25 mg BID             -continue lisinopril 40 mg daily             9/24 -bp borderline--no changes today 10: C. diff diarrhea: continue oral vanc, end 9/30; discontinue Colace for now             -  had mushy bm this morning             -pt states that stools are beginning to firm up    LOS: 1 days A FACE TO FACE EVALUATION WAS PERFORMED  Ranelle Oyster 02/15/2022, 8:13 AM

## 2022-02-15 NOTE — Evaluation (Signed)
Speech Language Pathology Assessment and Plan  Patient Details  Name: Jacob [name] MRN: [id number] Date of Birth: Sep 27, 1975  SLP Diagnosis: Cognitive Impairments  Rehab Potential: Good ELOS: 2.5-3 weeks   Today's Date: 02/15/2022 SLP Individual Time: 1300-1400 SLP Individual Time Calculation (min): 60 min  Hospital Problem: Principal Problem:   Debility  Past Medical History:  Past Medical History:  Diagnosis Date   Allergy    Diabetes mellitus without complication ([place])    GERD (gastroesophageal reflux disease)    Hypertension    Paralysis ([place])    Stroke [facility])    Past Surgical History:  Past Surgical History:  Procedure Laterality Date   APPENDECTOMY     BUBBLE STUDY  10/02/2021   Procedure: BUBBLE STUDY;  Surgeon: Fay Records, MD;  Location: [place];  Service: Cardiovascular;;   CRANIOPLASTY Right 02/09/2022   Procedure: Craniectomy with Replacement of Bone Flap From the Abdomen;  Surgeon: Newman Pies, MD;  Location: [place];  Service: Neurosurgery;  Laterality: Right;   CRANIOTOMY Right 09/15/2021   Procedure: RIGHT DECOMPRESSIVE CRANIOTOMY, PLACEMENT OF BONE FLAP IN ABDOMEN;  Surgeon: Newman Pies, MD;  Location: [place];  Service: Neurosurgery;  Laterality: Right;   IR CT HEAD LTD  09/12/2021   IR PERCUTANEOUS ART THROMBECTOMY/INFUSION INTRACRANIAL INC DIAG ANGIO  09/12/2021   IR US GUIDE VASC ACCESS RIGHT  09/12/2021   LAPAROSCOPIC APPENDECTOMY  10/24/2013   Procedure: APPENDECTOMY LAPAROSCOPIC;  Surgeon: Shann Medal, MD;  Location: [facility];  Service: General;;   RADIOLOGY WITH ANESTHESIA N/A 09/12/2021   Procedure: IR WITH ANESTHESIA;  Surgeon: Luanne Bras, MD;  Location: [place];  Service: Radiology;  Laterality: N/A;   TEE WITHOUT CARDIOVERSION N/A 10/02/2021   Procedure: TRANSESOPHAGEAL ECHOCARDIOGRAM (TEE);  Surgeon: Fay Records, MD;  Location: [facility];  Service: Cardiovascular;  Laterality: N/A;    Assessment / Plan /  Recommendation Clinical Impression Clinical Impression: Patient is a 46 y.o. year old male with history of right MCA occlusion s/p tenecteplase and interventional radiology with TICI3 who required decompressive craniectomy by Dr. Arnoldo Morale on 4/24. He presented for elective cranioplasty. History is also significant for right peroneal DVT on Eliquis 5 mg daily. He also required tracheostomy for prolonged respiratory failure with tracheostomy placed 5/1. His deficits include left hemiplegia from his prior stroke. He developed loose stools on POD #1 and stool was positive for C. diff toxin. He is on oral vancomycin. KUB unremarkable. Tolerating carb modified diet. The patient requires inpatient physical medicine and rehabilitation evaluations and treatment secondary to dysfunction due to prior CVA with left hemiplegia.  Patient transferred to CIR on 02/14/2022.   SLP consulted to complete CSE and cognitive-linguistic evaluation s/p R MCA CVA which occurred in April of 2023 (pt admitted following elective cranioplasty). Per SLUMS and informal assessment measures, pt presents with at least mild-moderate cognitive impairments as evident by achieving a score of 21/30 (n = 27 for individuals with a HS diploma). Specifically, pt presents with deficits in the following domains attention, memory, and executive functioning. Motor speech was grossly intelligible, though pressed and rapid at times. Verbal expression and auditory comprehension appeared grossly intact for basic information and conveying wants, needs, and ideas. Per CSE, OME significant for L orofacial weakness, asymmetry, and abnormal sensation + missing lower arch dentition (baseline per wife) resulting in slightly prolonged oral transit, subtle anterior loss of saliva on L, and buccal residuals. Pt cleared oral residuals given Min A verbal cues to implement safe swallowing  strategies (alternating bites/sips, small + single bites/sip, lingual sweep to L, and  upright positioning). No evidence of pharyngeal dysphagia observed with thin liquid via straw, puree, soft solid, nor coarse solid textures. At this time, oral motor and cognitive status support continuation of current diet textures (regular textures and thin liquids) with adherence to aforementioned aspiration precautions + full supervision for safety. Instrumental swallow assessment does not appear indicated at this time; however, will continue to monitor tolerance. Prior to CVA, pt was independent with all iADL's, working full time, and parent to a 48 year old.  Given pt's CLOF in comparison to PLOF, skilled ST intervention appears indicated to address cognitive impairments and implementation of safe swallowing precautions. Goals are set for Sup A to Min A to maximize pt's independence and decrease caregiver burden upon d/c. Pt in agreement with proposed ST POC.    Skilled Therapeutic Interventions          CSE, SLUMS, and informal assessment measures administered. Please see above for details.  SLP Assessment  Patient will need skilled Speech Lanaguage Pathology Services during CIR admission    Recommendations  SLP Diet Recommendations: Age appropriate regular solids;Thin Liquid Administration via: Cup;Straw Medication Administration: Whole meds with liquid Supervision: Patient able to self feed;Full supervision/cueing for compensatory strategies Compensations: Minimize environmental distractions;Slow rate;Small sips/bites;Follow solids with liquid;Lingual sweep for clearance of pocketing Postural Changes and/or Swallow Maneuvers: Seated upright 90 degrees;Upright 30-60 min after meal Oral Care Recommendations: Oral care BID Recommendations for Other Services: Neuropsych consult Patient destination: Home Follow up Recommendations: Outpatient SLP;24 hour supervision/assistance Equipment Recommended: None recommended by SLP    SLP Frequency 3 to 5 out of 7 days   SLP Duration  SLP  Intensity  SLP Treatment/Interventions 2.5-3 weeks  Minumum of 1-2 x/day, 30 to 90 minutes  Cognitive remediation/compensation;Patient/family education;Dysphagia/aspiration precaution training;Internal/external aids;Functional tasks    Pain Pain Assessment Pain Scale: 0-10 Pain Score: 0-No pain  Prior Functioning Cognitive/Linguistic Baseline: Within functional limits Type of Home: House  Lives With: Spouse;Family Available Help at Discharge: Family;Available 24 hours/day Education: Associate's Degree in Mudlogger Vocation: Full time employment (Worked in [employer] - 40+ hours a week)  SLP Evaluation Cognition Overall Cognitive Status: Impaired/Different from baseline Arousal/Alertness: Awake/alert Orientation Level: Oriented X4 Year: 2023 Month: September Day of Week: Correct Sustained Attention: Impaired Sustained Attention Impairment: Functional basic;Verbal complex Memory: Impaired Memory Impairment: Retrieval deficit;Storage deficit Awareness: Impaired Awareness Impairment: Intellectual impairment Problem Solving: Impaired Problem Solving Impairment: Functional basic;Verbal basic Behaviors:  (hyperverbal) Safety/Judgment: Impaired   SLUMS Examination Orientation: 3/3 Immediate Recall: 4/5 Mental Math Calculations: 3/3 Generative Naming: 1/3 Delayed Recall: 3/5 Attention/Working Memory: 1/2 Clock Drawing: 0/4 Visuospatial Skills: 2/2  Story Recall: 8/8 Total Score: 21/30  Comprehension Auditory Comprehension Overall Auditory Comprehension: Appears within functional limits for tasks assessed Visual Recognition/Discrimination Discrimination:  (L visual inattention or field cut?) Expression Expression Primary Mode of Expression: Verbal Verbal Expression Overall Verbal Expression: Appears within functional limits for tasks assessed Written Expression Dominant Hand: Right Written Expression: Not tested Oral Motor Oral Motor/Sensory  Function Overall Oral Motor/Sensory Function: Mild impairment Facial ROM: Reduced left;Suspected CN VII (facial) dysfunction Facial Symmetry: Abnormal symmetry left Facial Strength: Reduced left Facial Sensation: Reduced left Lingual ROM: Within Functional Limits Lingual Symmetry: Within Functional Limits Lingual Strength: Within Functional Limits Lingual Sensation: Within Functional Limits Velum: Within Functional Limits Motor Speech Overall Motor Speech: Impaired Respiration: Within functional limits Phonation: Normal Resonance: Within functional limits Articulation: Impaired Level of Impairment: Conversation Intelligibility:  Intelligibility reduced (negatively impacted by rapid rate of speech) Word: 75-100% accurate (100%) Phrase: 75-100% accurate (100%) Sentence: 75-100% accurate (100%) Conversation: 75-100% accurate (95%) Motor Planning: Witnin functional limits Motor Speech Errors: Not applicable  Care Tool Care Tool Cognition Ability to hear (with hearing aid or hearing appliances if normally used Ability to hear (with hearing aid or hearing appliances if normally used): 0. Adequate - no difficulty in normal conservation, social interaction, listening to TV   Expression of Ideas and Wants Expression of Ideas and Wants: 3. Some difficulty - exhibits some difficulty with expressing needs and ideas (e.g, some words or finishing thoughts) or speech is not clear   Understanding Verbal and Non-Verbal Content Understanding Verbal and Non-Verbal Content: 3. Usually understands - understands most conversations, but misses some part/intent of message. Requires cues at times to understand  Memory/Recall Ability Memory/Recall Ability : That he or she is in a hospital/hospital unit;Current season   Intelligibility: Intelligibility reduced (only negatively impacted by rapid rate of speech) Word: 75-100% accurate (100%) Phrase: 75-100% accurate (100%) Sentence: 75-100% accurate  (100%) Conversation: 75-100% accurate (95%)  Bedside Swallowing Assessment General Date of Onset:  (April 2023) Previous Swallow Assessment: FEES at [facility] - cleared for regular textures and thin liquids, per pt and pt's wife report) Temperature Spikes Noted: No Respiratory Status: Room air History of Recent Intubation: No Behavior/Cognition: Alert;Cooperative;Pleasant mood Oral Cavity - Dentition: Poor condition;Missing dentition Self-Feeding Abilities: Able to feed self Patient Positioning: Upright in bed Baseline Vocal Quality: Normal Volitional Cough: Strong Volitional Swallow: Able to elicit   Ice Chips Ice chips: Not tested Thin Liquid Thin Liquid: Within functional limits Presentation: Straw Nectar Thick Nectar Thick Liquid: Not tested Honey Thick Honey Thick Liquid: Not tested Puree Puree: Impaired Presentation: Spoon;Self Fed Oral Phase Functional Implications: Prolonged oral transit Solid Solid: Impaired Presentation: Self Fed Oral Phase Functional Implications: Left anterior spillage;Left lateral sulci pocketing (minimal) BSE Assessment Suspected Esophageal Findings Suspected Esophageal Findings:  (N/A) Risk for Aspiration Impact on safety and function: No limitations Other Related Risk Factors: History of GERD;Cognitive impairment  Short Term Goals: Week 1: SLP Short Term Goal 1 (Week 1): Pt will participate in functional and therapeutic recall activities targeting short-term and working memory with 80% accuracy given Mod A multimodal cues. SLP Short Term Goal 2 (Week 1): Pt will attend to functional and therapeutic tasks for 15 minute intervals with no verbal cues for redirection and/or to maintain alertness. SLP Short Term Goal 3 (Week 1): Pt will complete basic problem-solving tasks with 80% accuracy given Mod A vebral and visual cues. SLP Short Term Goal 4 (Week 1): Pt will utilize visual scanning strategies to participate in functional vision tasks to  include reading with 100% accuracy given Mod A verbal and visual prompts. SLP Short Term Goal 5 (Week 1): Pt will tolerate current diet of regular textures and thin liquids with no clinical s/sx concerning for airway invasion given Min A verbal cues for adherence to aspiration precautions.  Refer to Care Plan for Long Term Goals  Recommendations for other services: Neuropsych  Discharge Criteria: Patient will be discharged from SLP if patient refuses treatment 3 consecutive times without medical reason, if treatment goals not met, if there is a change in medical status, if patient makes no progress towards goals or if patient is discharged from hospital.  The above assessment, treatment plan, treatment alternatives and goals were discussed and mutually agreed upon: by patient  Romelle Starcher A Meher Kucinski 02/15/2022, 3:38 PM

## 2022-02-15 NOTE — Evaluation (Signed)
Physical Therapy Assessment and Plan  Patient Details  Name: Jacob [name] MRN: [id number] Date of Birth: Jul 12, 1975  PT Diagnosis: Abnormal posture, Abnormality of gait, Cognitive deficits, Contracture of joint: L UE, Coordination disorder, Difficulty walking, Edema, Hemiparesis non-dominant, Hemiplegia non-dominant, Hypertonia, and Pain in joint Rehab Potential: Excellent ELOS: 2.5-3 weeks   Today's Date: 02/15/2022 PT Individual Time: 1030-1130 PT Individual Time Calculation (min): 60 min    Hospital Problem: Principal Problem:   Debility   Past Medical History:  Past Medical History:  Diagnosis Date   Allergy    Diabetes mellitus without complication ([place])    GERD (gastroesophageal reflux disease)    Hypertension    Paralysis ([place])    Stroke [facility])    Past Surgical History:  Past Surgical History:  Procedure Laterality Date   APPENDECTOMY     BUBBLE STUDY  10/02/2021   Procedure: BUBBLE STUDY;  Surgeon: Fay Records, MD;  Location: [place];  Service: Cardiovascular;;   CRANIOPLASTY Right 02/09/2022   Procedure: Craniectomy with Replacement of Bone Flap From the Abdomen;  Surgeon: Newman Pies, MD;  Location: [place];  Service: Neurosurgery;  Laterality: Right;   CRANIOTOMY Right 09/15/2021   Procedure: RIGHT DECOMPRESSIVE CRANIOTOMY, PLACEMENT OF BONE FLAP IN ABDOMEN;  Surgeon: Newman Pies, MD;  Location: [place];  Service: Neurosurgery;  Laterality: Right;   IR CT HEAD LTD  09/12/2021   IR PERCUTANEOUS ART THROMBECTOMY/INFUSION INTRACRANIAL INC DIAG ANGIO  09/12/2021   IR US GUIDE VASC ACCESS RIGHT  09/12/2021   LAPAROSCOPIC APPENDECTOMY  10/24/2013   Procedure: APPENDECTOMY LAPAROSCOPIC;  Surgeon: Shann Medal, MD;  Location: [facility];  Service: General;;   RADIOLOGY WITH ANESTHESIA N/A 09/12/2021   Procedure: IR WITH ANESTHESIA;  Surgeon: Luanne Bras, MD;  Location: [place];  Service: Radiology;  Laterality: N/A;   TEE WITHOUT CARDIOVERSION N/A 10/02/2021    Procedure: TRANSESOPHAGEAL ECHOCARDIOGRAM (TEE);  Surgeon: Fay Records, MD;  [facility];  Service: Cardiovascular;  Laterality: N/A;    Assessment & Plan Clinical Impression: Patient is a 46 y.o. year old male with history of right MCA occlusion s/p tenecteplase and interventional radiology with TICI3 who required decompressive craniectomy by Dr. Arnoldo Morale on 4/24. He presented for elective cranioplasty. History is also significant for right peroneal DVT on Eliquis 5 mg daily. He also required tracheostomy for prolonged respiratory failure with tracheostomy placed 5/1. His deficits include left hemiplegia from his prior stroke. He developed loose stools on POD #1 and stool was positive for C. diff toxin. He is on oral vancomycin. KUB unremarkable. Tolerating carb modified diet. The patient requires inpatient physical medicine and rehabilitation evaluations and treatment secondary to dysfunction due to prior CVA with left hemiplegia.  Patient transferred to [facility] on 02/14/2022 .   Patient currently requires mod with mobility secondary to muscle weakness and muscle joint tightness, decreased cardiorespiratoy endurance, abnormal tone, decreased coordination, and decreased motor planning, decreased visual motor skills, field cut, and hemianopsia, decreased attention to left, decreased attention, decreased awareness, decreased problem solving, and decreased safety awareness, and decreased sitting balance, decreased standing balance, decreased postural control, hemiplegia, and decreased balance strategies.  Prior to hospitalization, patient was min with mobility and lived with Spouse, Family in a House home.  Home access is  Ramped entrance.  Patient will benefit from skilled PT intervention to maximize safe functional mobility, minimize fall risk, and decrease caregiver burden for planned discharge home with intermittent assist.  Anticipate patient will benefit from follow up  OP at discharge.  PT - End  of Session Activity Tolerance: Tolerates 30+ min activity with multiple rests Endurance Deficit: Yes PT Assessment Rehab Potential (ACUTE/IP ONLY): Excellent PT Barriers to Discharge: Home environment access/layout PT Patient demonstrates impairments in the following area(s): Balance;Pain;Behavior;Perception;Safety;Edema;Endurance;Sensory;Skin Integrity;Motor;Nutrition PT Transfers Functional Problem(s): Bed Mobility;Bed to Chair;Car;Furniture PT Locomotion Functional Problem(s): Ambulation;Wheelchair Mobility;Stairs PT Plan PT Intensity: Minimum of 1-2 x/day ,45 to 90 minutes PT Frequency: 5 out of 7 days PT Duration Estimated Length of Stay: 2.5-3 weeks PT Treatment/Interventions: Ambulation/gait training;Discharge planning;Cognitive remediation/compensation;DME/adaptive equipment instruction;Functional mobility training;Pain management;Psychosocial support;Therapeutic Activities;Splinting/orthotics;UE/LE Strength taining/ROM;Visual/perceptual remediation/compensation;Wheelchair propulsion/positioning;UE/LE Coordination activities;Therapeutic Exercise;Stair training;Skin care/wound management;Patient/family education;Neuromuscular re-education;Functional electrical stimulation;Disease management/prevention;Community reintegration;Balance/vestibular training PT Transfers Anticipated Outcome(s): min A-CGA using LRAD PT Locomotion Anticipated Outcome(s): min A using LRAD 150 ft PT Recommendation Recommendations for Other Services: Neuropsych consult;Therapeutic Recreation consult (coping strategies, learned helplessness with L hemiplegia) Therapeutic Recreation Interventions: Stress management Follow Up Recommendations: Outpatient PT Patient destination: Home Equipment Recommended: To be determined Equipment Details: Pt has hospital bed and reclining back manual w/c, unsure about RW   PT Evaluation Precautions/Restrictions Precautions Precautions: Fall Precaution Comments: L hemiplegia;  painful LUE/shoulder; developing flexor contracture Required Braces or Orthoses: Splint/Cast Splint/Cast: WHO - Resting hand splint at night Restrictions Weight Bearing Restrictions: No Pain Interference Pain Interference Pain Effect on Sleep: 2. Occasionally Pain Interference with Therapy Activities: 2. Occasionally Pain Interference with Day-to-Day Activities: 2. Occasionally Home Living/Prior Functioning Home Living Available Help at Discharge: Family;Available 24 hours/day Type of Home: House Home Access: Ramped entrance Home Layout: Able to live on main level with bedroom/bathroom;Multi-level Alternate Level Stairs-Number of Steps: full flight Alternate Level Stairs-Rails: Left Bathroom Shower/Tub: Multimedia programmer: Standard Additional Comments: reclining wheelchair in the room, has hospityal bed at home  Lives With: Spouse;Family Prior Function Level of Independence: Needs assistance with gait;Needs assistance with tranfers  Able to Take Stairs?: No Driving: No Vision/Perception  Vision - History Ability to See in Adequate Light: 3 Highly impaired Vision - Assessment Eye Alignment: Within Functional Limits Ocular Range of Motion: Within Functional Limits (saccadic intrusions with tracking) Alignment/Gaze Preference: Gaze right (preference R, able to track to midline and L) Tracking/Visual Pursuits: Decreased smoothness of vertical tracking;Decreased smoothness of horizontal tracking;Requires cues, head turns, or add eye shifts to track;Unable to hold eye position out of midline Saccades: Additional head turns occurred during testing Diplopia Assessment:  (does not display diplopia during OT EVAL - Rreported on acute - continue to monitor) Additional Comments: L field cut / hemianopsia Perception Perception: Impaired Inattention/Neglect: Does not attend to left side of body Praxis Praxis: Impaired Praxis Impairment Details: Initiation;Motor planning   Cognition Overall Cognitive Status: Impaired/Different from baseline Arousal/Alertness: Awake/alert Orientation Level: Oriented X4 Year: 2023 Month: September Day of Week: Correct Attention: Sustained Sustained Attention: Impaired Sustained Attention Impairment: Functional basic Memory: Impaired Memory Impairment: Retrieval deficit;Storage deficit Awareness: Impaired Awareness Impairment: Intellectual impairment Problem Solving: Impaired Problem Solving Impairment: Functional basic Behaviors:  (hyperverbal - not physically impulsive) Safety/Judgment: Impaired Sensation Sensation Light Touch: Impaired Detail Light Touch Impaired Details: Impaired LUE;Impaired LLE Hot/Cold: Appears Intact Proprioception: Impaired by gross assessment (L UE/LE) Coordination Gross Motor Movements are Fluid and Coordinated: No Fine Motor Movements are Fluid and Coordinated: No Coordination and Movement Description: dense L hemiplegia Motor  Motor Motor: Hemiplegia;Abnormal tone;Abnormal postural alignment and control   Trunk/Postural Assessment  Cervical Assessment Cervical Assessment: Exceptions to [facility] (forward head) Thoracic Assessment Thoracic Assessment: Exceptions to [facility] (rounded shoulders) Lumbar Assessment Lumbar Assessment:  Exceptions to [facility] (posterior pelvic tilt) Postural Control Postural Control: Deficits on evaluation (decreased/delayed)  Balance Static Sitting Balance Static Sitting - Balance Support: Feet supported;Right upper extremity supported Static Sitting - Level of Assistance: 5: Stand by assistance Dynamic Sitting Balance Dynamic Sitting - Balance Support: Feet supported Dynamic Sitting - Level of Assistance: 4: Min assist Static Standing Balance Static Standing - Balance Support: During functional activity;Bilateral upper extremity supported Static Standing - Level of Assistance: 4: Min assist Dynamic Standing Balance Dynamic Standing - Balance Support: During  functional activity;Bilateral upper extremity supported Dynamic Standing - Level of Assistance: 3: Mod assist Extremity Assessment  RLE Assessment RLE Assessment: Within Functional Limits Active Range of Motion (AROM) Comments: thight hamstrings General Strength Comments: Grossly 5/5 throughout LLE Assessment LLE Assessment: Exceptions to [facility] Active Range of Motion (AROM) Comments: tight hamstring and heel cord LLE Strength LLE Overall Strength: Deficits;Due to premorbid status Left Hip Flexion: 2-/5 Left Knee Flexion: 1/5 Left Knee Extension: 1/5 Left Ankle Dorsiflexion: 0/5 Left Ankle Plantar Flexion: 0/5 LLE Tone LLE Tone: Hypertonic Hypertonic Details: hypertonicity of hamstrings and gastroc with progressing to contracture of L ankle  Care Tool Care Tool Bed Mobility Roll left and right activity   Roll left and right assist level: Minimal Assistance - Patient > 75%    Sit to lying activity   Sit to lying assist level: Moderate Assistance - Patient 50 - 74%    Lying to sitting on side of bed activity   Lying to sitting on side of bed assist level: the ability to move from lying on the back to sitting on the side of the bed with no back support.: Moderate Assistance - Patient 50 - 74%     Care Tool Transfers Sit to stand transfer   Sit to stand assist level: Maximal Assistance - Patient 25 - 49%    Chair/bed transfer   Chair/bed transfer assist level: Moderate Assistance - Patient 50 - 74%     Physiological scientist transfer assist level: Maximal Assistance - Patient 25 - 49%      Care Tool Locomotion Ambulation   Assist level: Maximal Assistance - Patient 25 - 49% Assistive device: Walker-rolling Max distance: 2 feet  Walk 10 feet activity Walk 10 feet activity did not occur: Safety/medical concerns       Walk 50 feet with 2 turns activity Walk 50 feet with 2 turns activity did not occur: Safety/medical concerns      Walk 150 feet  activity Walk 150 feet activity did not occur: Safety/medical concerns      Walk 10 feet on uneven surfaces activity Walk 10 feet on uneven surfaces activity did not occur: Safety/medical concerns      Stairs Stair activity did not occur: N/A (not taking stairs at baseline)        Walk up/down 1 step activity Walk up/down 1 step or curb (drop down) activity did not occur: N/A      Walk up/down 4 steps activity Walk up/down 4 steps activity did not occur: N/A      Walk up/down 12 steps activity Walk up/down 12 steps activity did not occur: N/A      Pick up small objects from floor Pick up small object from the floor (from standing position) activity did not occur: Safety/medical concerns      Wheelchair Is the patient using a wheelchair?: Yes Type of Wheelchair: Geophysicist/field seismologist  assist level: Supervision/Verbal cueing Max wheelchair distance: 106 ft  Wheel 50 feet with 2 turns activity   Assist Level: Supervision/Verbal cueing  Wheel 150 feet activity   Assist Level: Moderate Assistance - Patient 50 - 74%    Refer to Care Plan for Long Term Goals  SHORT TERM GOAL WEEK 1 PT Short Term Goal 1 (Week 1): Patient will perform bed mobility with min A consistently. PT Short Term Goal 2 (Week 1): Patient will perform basic transfers with min A >50% of the time. PT Short Term Goal 3 (Week 1): Patient will ambulate >100 ft using LRAD with mod A.  Recommendations for other services: Neuropsych and Therapeutic Recreation  Stress management  Skilled Therapeutic Intervention Evaluation completed (see details above and below) with education on PT POC and goals and individual treatment initiated with focus on functional mobility/transfers, LE strength, dynamic standing balance/coordination, simulated car transfers, and improved endurance with activity Patient provided with personal reclining back manual wheelchair with foam cushion in the room. Patient also provided with RW for use in  room and therapist adjusted to proper height for patient.  Patient in w/c in the room upon PT arrival. Patient alert and agreeable to PT session. Patient reported 6/10 L shoulder pain with mobility during session, RN made aware. PT provided repositioning, rest breaks, and distraction as pain interventions throughout session.   Therapeutic Activity: Bed Mobility: Patient performed rolling R/L and supine to/from sit with min-mod A with use of bed rails with the bed flat.  Transfers: Patient performed squat w/c>mat table with min-mod A to the R and sit to/from stand x1 with max A without AD with increased L lean. He then performed stand pivot mat table>w/c and w/c to bed with mod A with PT blocking and facilitation L limb throughout. He performed sit to/from stand using RW x2 with min-mod A with PT blocking L knee without buckling. Provided verbal cues and facilitation for hand placement, foot placement, forward weight shift, stepping during pivot, and controlling descent. Patient performed a simulated SUV height car transfer with mod-max A without an AD. Provided cues for safe technique.  Gait Training:  Patient ambulated 2 feet using RW with mod-max A, required total A for L limb advancement due to lack of DF activation and poor grip on L hand grip. Applied L hand splint and L DF assist wrap and patient ambulated 31 ft with RW with mod A and ~50% assist for limb advancement to complete reciprocal stepping pattern. Ambulated with decreased hamstring activation in L swing, reduced R weight shift, decreased L stance time, increased L knee flexion in stance, forward and L trunk flexion, and downward head gaze. Provided verbal cues for erect posture, looking ahead, sequencing, L limb advancement with heel at initial contact, and R weight shift in stance.  Wheelchair Mobility:  Patient propelled wheelchair 106 feet with supervision using R hemi-technique. Patient able to teach back technique learned  previously.  Instructed pt in results of PT evaluation as detailed above, PT POC, rehab potential, rehab goals, and discharge recommendations. Additionally discussed CIR's policies regarding fall safety and use of chair alarm and/or quick release belt. Pt verbalized understanding and in agreement. Will update pt's family members as they become available.   Patient in bed at end of session with breaks locked, bed alarm set, and all needs within reach.    Discharge Criteria: Patient will be discharged from PT if patient refuses treatment 3 consecutive times without medical reason, if treatment goals  not met, if there is a change in medical status, if patient makes no progress towards goals or if patient is discharged from hospital.  The above assessment, treatment plan, treatment alternatives and goals were discussed and mutually agreed upon: by patient  Mayzie Caughlin L Jaylyn Iyer PT, DPT, NCS, CBIS  02/15/2022, 12:42 PM

## 2022-02-16 ENCOUNTER — Other Ambulatory Visit: Payer: Self-pay

## 2022-02-16 LAB — GLUCOSE, CAPILLARY
Glucose-Capillary: 163 mg/dL — ABNORMAL HIGH (ref 70–99)
Glucose-Capillary: 166 mg/dL — ABNORMAL HIGH (ref 70–99)
Glucose-Capillary: 148 mg/dL — ABNORMAL HIGH (ref 70–99)
Glucose-Capillary: 203 mg/dL — ABNORMAL HIGH (ref 70–99)

## 2022-02-16 NOTE — Patient Outreach (Signed)
Medicaid Managed Care Social Work Note  02/16/2022 Name:  Jacob [name] MRN:  [id number] DOB:  1975-12-29  Jacob [name] is an 46 y.o. year old male who is a primary patient of Care, [place].  The [facility] Managed Care Coordination team was consulted for assistance with:  Level of Care Concerns  Jacob [name] was given information about Medicaid Managed Care Coordination team services today. Jacob [name] Primary Caregiver agreed to services and verbal consent obtained.  Engaged with patient  for by telephone forfollow up visit in response to referral for case management and/or care coordination services.   Assessments/Interventions:  Review of past medical history, allergies, medications, health status, including review of consultants reports, laboratory and other test data, was performed as part of comprehensive evaluation and provision of chronic care management services.  SDOH: (Social Determinant of Health) assessments and interventions performed: BSW completed a telephone outreach with patients wife. He did have surgery and was released to inpatient reheb on 9/23. Wife states she does not know when he will be discharged but does want to have PCS set up before he is discharged. BSW informed they will usually set it up before he is discharged home. Wife asked for some information for some home modifications, BSW provided her with the phone number for healthy blue and researched some home modification assistance places in [place] and [place] and found [employer] and vocational rehab. No other resources are needed at this time. Advanced Directives Status:  Not addressed in this encounter.  Care Plan                 Allergies  Allergen Reactions   Hydrochlorothiazide Other (See Comments)    Bilateral muscle cramping    Medications Reviewed Today     Reviewed by Orlinda Blalock, CPhT (Pharmacy Technician) on 02/14/22 at 76  Med List Status: Complete    Medication Order Taking? Sig Documenting Provider Last Dose Status Informant  acetaminophen (TYLENOL) 160 MG/5ML solution [id number]  Place 20.3 mLs (650 mg total) into feeding tube every 4 (four) hours as needed for mild pain (or temp > 37.5 C (99.5 F)).  Patient not taking: Reported on 12/31/2021   de [name], NP  Active Nursing Home Medication Administration Guide (MAG)  acetaminophen (TYLENOL) 325 MG tablet [id number]  Take 650 mg by mouth every 12 (twelve) hours as needed for moderate pain. [provider]  Active Nursing Home Medication Administration Guide (MAG)  albuterol (PROVENTIL) (2.5 MG/3ML) 0.083% nebulizer solution [id number]  Take 3 mLs (2.5 mg total) by nebulization every 6 (six) hours as needed for wheezing or shortness of breath.  Patient not taking: Reported on 12/08/2021   de [name], NP  Active Nursing Home Medication Administration Guide (MAG)  amLODipine (NORVASC) 10 MG tablet [id number]  Place 1 tablet (10 mg total) into feeding tube daily.  Patient taking differently: Take 10 mg by mouth daily.   de Yolanda Manges, Altamease Oiler, NP  Active Nursing Home Medication Administration Guide (MAG)           Med Note Nash Mantis, [name]   Tue Feb 10, 2022  7:07 AM)    atorvastatin (LIPITOR) 40 MG tablet [id number]  Place 1 tablet (40 mg total) into feeding tube daily.  Patient taking differently: Take 40 mg by mouth daily.   de Yolanda Manges, Altamease Oiler, NP  Active Nursing Home Medication Administration Guide (MAG)  Med Note [name], [name]   Tue Feb 10, 2022  7:07 AM)    bisacodyl (DULCOLAX) 10 MG suppository [id number]  Place 1 suppository (10 mg total) rectally daily at 2 PM.  Patient not taking: Reported on 12/08/2021   de [name], NP  Active Nursing Home Medication Administration Guide (MAG)  carvedilol (COREG) 25 MG tablet [id number]  Place 1 tablet (25 mg total) into feeding tube 2 (two) times daily with a meal.  Patient not taking:  Reported on 12/08/2021   de [name], [name], NP  Active Nursing Home Medication Administration Guide (MAG)  chlorproMAZINE (THORAZINE) 10 MG tablet [id number]  Place 1 tablet (10 mg total) into feeding tube 3 (three) times daily as needed for hiccoughs.  Patient not taking: Reported on 12/08/2021   de [name], NP  Active Nursing Home Medication Administration Guide (MAG)  cloNIDine (CATAPRES - DOSED IN MG/24 HR) 0.3 mg/24hr patch [id number]  Place 1 patch (0.3 mg total) onto the skin once a week. de [name], [name], NP  Active Nursing Home Medication Administration Guide (MAG)           Med Note [name], [name]   Tue Feb 10, 2022  7:07 AM)    Ensure Plus (ENSURE PLUS) LIQD [id number]  Take 237 mLs by mouth in the morning, at noon, in the evening, and at bedtime. [provider]  Active Nursing Home Medication Administration Guide (MAG)           Med Note [name], [name]   Tue Feb 10, 2022  7:07 AM)    escitalopram (LEXAPRO) 20 MG tablet [id number]  Place 1 tablet (20 mg total) into feeding tube daily.  Patient taking differently: Take 20 mg by mouth daily.   de [name], [name], NP  Active Nursing Home Medication Administration Guide (MAG)           Med Note [name], [name]   Tue Feb 10, 2022  7:09 AM)    famotidine (PEPCID) 20 MG tablet [id number]  Take 20 mg by mouth 2 (two) times daily. [provider]  Active Nursing Home Medication Administration Guide (MAG)           Med Note [name], [name]   Tue Feb 10, 2022  7:08 AM)    HUMALOG KWIKPEN 100 UNIT/ML KwikPen [id number]  Inject 0-10 Units into the skin every 4 (four) hours. Blood glucose of 150 and below = 0 units, 151-200= 2 units, 201-250= 4 units, 251-300= 6 units, 301-350= 8 units, 351-400= 10 units, 401--999 If CBG is >400 NOTIFY MD/NP [provider]  Active Nursing Home Medication Administration Guide (MAG)  insulin aspart (NOVOLOG) 100 UNIT/ML injection [id number]  Inject 8 Units  into the skin 4 (four) times daily.  Patient not taking: Reported on 12/31/2021   de [name], NP  Active Nursing Home Medication Administration Guide (MAG)  insulin detemir (LEVEMIR) 100 UNIT/ML injection [id number]  Inject 0.5 mLs (50 Units total) into the skin 2 (two) times daily.  Patient not taking: Reported on 12/08/2021   de [name], NP  Active Nursing Home Medication Administration Guide (MAG)  insulin glargine (LANTUS) 100 UNIT/ML injection [id number]  Inject 17 Units into the skin every 12 (twelve) hours. [provider]  Active Nursing Home Medication Administration Guide (MAG)           Med Note Earlene Plater, Rolm Gala   Mon  Feb 09, 2022  6:30 AM) Patient took 8 units at 2300 on 02/08/22 per patient's wife  linezolid (ZYVOX) 600 MG tablet [id number]  Place 1 tablet (600 mg total) into feeding tube every 12 (twelve) hours.  Patient not taking: Reported on 12/08/2021   de [name], NP  Active Nursing Home Medication Administration Guide (MAG)  lisinopril (ZESTRIL) 40 MG tablet [id number]  Place 1 tablet (40 mg total) into feeding tube daily. de [name], [name], NP  Active Nursing Home Medication Administration Guide (MAG)           Med Note [name], [name]   Tue Feb 10, 2022  7:09 AM)    melatonin 3 MG TABS tablet [id number]  Take 3 mg by mouth at bedtime. [provider]  Active Nursing Home Medication Administration Guide (MAG)           Med Note [name], [name]   Tue Feb 10, 2022  7:09 AM)    metoprolol tartrate (LOPRESSOR) 25 MG tablet [id number]  Take 25 mg by mouth 2 (two) times daily. [provider]  Active Nursing Home Medication Administration Guide (MAG)           Med Note [name], [name]   Tue Feb 10, 2022  7:09 AM)    ondansetron [facility]) 4 MG/2ML SOLN injection [id number]  Inject 2 mLs (4 mg total) into the vein every 4 (four) hours as needed for nausea or vomiting.  Patient not taking: Reported on 12/31/2021   de [name], NP  Active Nursing Home Medication Administration Guide (MAG)  oxyCODONE (OXY IR/ROXICODONE) 5 MG immediate release tablet [id number]  Take 2.5 mg by mouth every 6 (six) hours as needed for pain. [provider]  Active Nursing Home Medication Administration Guide (MAG)  pantoprazole sodium (PROTONIX) 40 mg [id number]  Place 40 mg into feeding tube daily.  Patient not taking: Reported on 12/08/2021   de [name], NP  Active Nursing Home Medication Administration Guide (MAG)  polyethylene glycol (MIRALAX / GLYCOLAX) 17 g packet [id number]  Place 17 g into feeding tube daily as needed for mild constipation, moderate constipation or severe constipation. de [name], [name], NP  Active Nursing Home Medication Administration Guide (MAG)  topiramate (TOPAMAX) 50 MG tablet [id number]  Place 1 tablet (50 mg total) into feeding tube 2 (two) times daily.  Patient taking differently: Take 50 mg by mouth every 12 (twelve) hours.   de [name], [name], NP  Active Nursing Home Medication Administration Guide (MAG)           Med Note [name], [name]   Tue Feb 10, 2022  7:08 AM)    traZODone (DESYREL) 50 MG tablet [id number]  Take 25 mg by mouth at bedtime. [provider]  Active Nursing Home Medication Administration Guide (MAG)           Med Note Noralee Stain   Tue Feb 10, 2022  7:08 AM)              Patient Active Problem List   Diagnosis Date Noted   Debility 02/14/2022   Status post craniectomy 02/09/2022   Pneumonia of both lower lobes due to methicillin resistant Staphylococcus aureus (MRSA) (HCC)    Cerebral embolism with cerebral infarction 09/20/2021   Hypernatremia 09/19/2021   Urinary retention 09/18/2021   Fever 09/18/2021   Acute respiratory failure with hypoxia (HCC)    Cerebral  edema (HCC) 09/15/2021   Acute ischemic stroke (HCC) 09/12/2021   Vitamin D deficiency 11/04/2020   Hypertensive urgency 10/31/2020   Hyperlipidemia  03/28/2018   Diabetes mellitus without complication (HCC) 02/23/2018   Essential hypertension 02/23/2018    Conditions to be addressed/monitored per PCP order:   level of care  There are no care plans that you recently modified to display for this patient.   Follow up:  Patient agrees to Care Plan and Follow-up.  Plan: The Managed Medicaid care management team will reach out to the patient again over the next 30 days.  Date/time of next scheduled Social Work care management/care coordination outreach:  03/18/22  [name], Kenard Gower, [facility]  [facility]  High Risk Managed Medicaid Team  [phone]

## 2022-02-16 NOTE — Progress Notes (Signed)
Physical Therapy Session Note  Patient Details  Name: Jacob [name] MRN: [id number] Date of Birth: 15-Jan-1976  Today's Date: 02/16/2022 PT Individual Time: 9983-3825 + 1300-1326 PT Individual Time Calculation (min): 58 min + 26 min  Short Term Goals: Week 1:  PT Short Term Goal 1 (Week 1): Patient will perform bed mobility with min A consistently. PT Short Term Goal 2 (Week 1): Patient will perform basic transfers with min A >50% of the time. PT Short Term Goal 3 (Week 1): Patient will ambulate >100 ft using LRAD with mod A.  Skilled Therapeutic Interventions/Progress Updates:       1st session: Pt sitting in w/c to start - agreeable to PT tx. Denies pain. Donned tennis shoes with dependant assist for time management. Transported in w/c to main rehab gym.  Sit<>stand to RW with minA - totalA needed for managing LUE into hand splint - limited by tone.   Instructed in gait training using RW with LUE hand splint, L foot ace wrapped for DF assist, and shoe cover for toe cap. Ambulated 2ft + 83ft + 53ft (seated rest breaks) with min/modA from PT and +2 assist for w/c follow for added safety. Pt requires assist ~25% of the time for initiating swing phase on L. L foot externally rotated, no knee buckling but soft knee flexion in stance. Gait distance limited by fatigue.   In // bars, worked on loading LLE for stance phase control and for proprioception/strengthening. Standing toe taps to 3inch block with PT blocking L knee and providing trunk support on L. Required modA for standing balance while completing 3x10 (seated rest breaks b/w sets).  Pt returned to his room and remained seated in w/c at end of session. Pt wanting to get back into bed but floor wet from recent cleaning - recommended for him to allow floor to dry and call for nursing to assist. Pt voiced understanding.    2nd session: Pt greeted in recliner, agreeable to PT tx. Denies pain. Stand<>pivot transfer with no AD, modA  needed for trunk support due to posterior bias. Transported in w/c to ortho rehab gym with [place] for time management. Assisted onto Nustep with similar stand<>pivot transfer. SetupA required for L hemi body management. Pt completed 6.5 minutes with L5 resistance. Required continuous support at his L knee due to hip abduction and inability to reduce "frog legging." Achieved 196 total steps. Returned to w/c via stand<>pivot transfer with modA and returned to room and assisted to recliner in similar manner. Remained in recliner with safety belt alarm on, all immediate needs within reach.   Therapy Documentation Precautions:  Precautions Precautions: Fall Precaution Comments: L hemiplegia; painful LUE/shoulder; Required Braces or Orthoses: Splint/Cast Splint/Cast: WHO - Resting hand splint at night Restrictions Weight Bearing Restrictions: No General:     Therapy/Group: Individual Therapy  Alger Simons 02/16/2022, 10:11 AM

## 2022-02-16 NOTE — Progress Notes (Signed)
Occupational Therapy Session Note  Patient Details  Name: Jacob [name] MRN: [id number] Date of Birth: May 03, 1976  Today's Date: 02/16/2022 OT Individual Time: 1962-2297 OT Individual Time Calculation (min): 75 min    Short Term Goals: Week 1:  OT Short Term Goal 1 (Week 1): Patient will don pull over shirt with no more than mod assist OT Short Term Goal 2 (Week 1): Patient will bathe face, chest, abdomen, upper thighs and below knees following set up and cueing with min assist OT Short Term Goal 3 (Week 1): Patient will transfer to commode with min assist OT Short Term Goal 4 (Week 1): Patient will complete oral care with min assist OT Short Term Goal 5 (Week 1): Patient will tolerate passive stretch to LUE to 90* of shoulder flexion, and at least 20* of external rotation without significant increase in report of pain.  Skilled Therapeutic Interventions/Progress Updates:    Patient agreeable to participate in OT session. Reports 0/10 pain level.   Patient participated in skilled OT session focusing on NM re-ed during ADL re-training and functional transfers. Therapist facilitated session while providing education on bed mobility and NDT transfer technique to in order to improve pt's overall functional performance and left side awareness while participating ADL tasks. Mod Assist provided during bed mobility and transferring from left sidelying to sitting on EOB. Therapist provided pt with manual assist to use RLE to push legs off EOB and bring UB up to seated. Mod Assist provided during sqt pvt transfer using NDT technique with left knee blocked. Pt sat up in w/c to complete self feeding requiring set-up of tray to cut sausage and open drinks. Pt completed grooming and dressing tasks at w/c level at sink. Due to decreased attention to task and difficulty talking and completing unrelated self care task, pt required intermittent VC to return to task, for task sequencing and initiation. Pt required  Max assist for UB dressing demonstrating difficulty attending and managing LUE. Pt was able to hold sink and complete sit to stand with Min Assist during LB dressing. Required consistent verbal cues to stand tall and/or look up as he preferred a forward flexed posture. LB dressing completed with total assist provided due to time constraint. With direct supervision and cues to remain on task, pt completed grooming task of brushing his teeth with supervision.    Therapy Documentation Precautions:  Precautions Precautions: Fall Precaution Comments: L hemiplegia; painful LUE/shoulder; Required Braces or Orthoses: Splint/Cast Splint/Cast: WHO - Resting hand splint at night Restrictions Weight Bearing Restrictions: No    Therapy/Group: Individual Therapy  Ailene Ravel, OTR/L,CBIS  Supplemental OT - [facility] and [facility]  02/16/2022, 8:04 AM

## 2022-02-16 NOTE — Patient Instructions (Signed)
Visit Information  Mr. Eversley was given information about Medicaid Managed Care team care coordination services as a part of their [employer] Medicaid benefit. Tynan A Schnoebelen verbally consented to engagement with the Medicaid Managed Care team.   If you are experiencing a medical emergency, please call 911 or report to your local emergency department or urgent care.   If you have a non-emergency medical problem during routine business hours, please contact your provider's office and ask to speak with a nurse.   For questions related to your [employer] Medicaid health plan, please call: [phone] or visit the homepage here: [email or web address]  If you would like to schedule transportation through your Healthy [employer] Medicaid plan, please call the following number at least 2 days in advance of your appointment: [phone]  For information about your ride after you set it up, call Ride Assist at [phone]. Use this number to activate a Will Call pickup, or if your transportation is late for a scheduled pickup. Use this number, too, if you need to make a change or cancel a previously scheduled reservation.  If you need transportation services right away, call [phone]. The after-hours call center is staffed 24 hours to handle ride assistance and urgent reservation requests (including discharges) 365 days a year. Urgent trips include sick visits, hospital discharge requests and life-sustaining treatment.  Call the Behavioral Health Crisis Line at [phone], at any time, 24 hours a day, 7 days a week. If you are in danger or need immediate medical attention call 911.  If you would like help to quit smoking, call 1-800-QUIT-NOW (1-800-784-8669) OR Espaol: 1-855-Djelo-Ya (1-855-335-3569) o para ms informacin haga clic aqu or Text READY to 200-400 to register via text  Mr. Larkin - following are the goals we discussed in your visit today:   Goals  Addressed   None       Social Worker will follow up in 30 days .   Antionetta Ator, BSW, MHA [facility]  [place]  High Risk Managed Medicaid Team  [phone]   Following is a copy of your plan of care:  There are no care plans that you recently modified to display for this patient.    

## 2022-02-16 NOTE — Progress Notes (Signed)
Speech Language Pathology Daily Session Note  Patient Details  Name: Jacob [name] MRN: [id number] Date of Birth: 08-02-1975  Today's Date: 02/16/2022 SLP Individual Time: 1430-1500 SLP Individual Time Calculation (min): 30 min  Short Term Goals: Week 1: SLP Short Term Goal 1 (Week 1): Pt will participate in functional and therapeutic recall activities targeting short-term and working memory with 80% accuracy given Mod A multimodal cues. SLP Short Term Goal 2 (Week 1): Pt will attend to functional and therapeutic tasks for 15 minute intervals with no verbal cues for redirection and/or to maintain alertness. SLP Short Term Goal 3 (Week 1): Pt will complete basic problem-solving tasks with 80% accuracy given Mod A vebral and visual cues. SLP Short Term Goal 4 (Week 1): Pt will utilize visual scanning strategies to participate in functional vision tasks to include reading with 100% accuracy given Mod A verbal and visual prompts. SLP Short Term Goal 5 (Week 1): Pt will tolerate current diet of regular textures and thin liquids with no clinical s/sx concerning for airway invasion given Min A verbal cues for adherence to aspiration precautions.  Skilled Therapeutic Interventions: Skilled treatment session focused on cognitive goals. Upon arrival, patient was awake while upright in the recliner.  SLP facilitated session by initially providing Max A verbal and visual cues for left visual scanning during a calendar making task. By end of task, patient required only Min verbal cues. Patient verbose throughout session and required Mod verbal cues for sustained attention to task for ~5 minute increments. Patient left upright in recliner with alarm on and all needs within reach. Continue with current plan of care.      Pain No/Denies Pain   Therapy/Group: Individual Therapy  [name] 02/16/2022, 3:08 PM

## 2022-02-16 NOTE — Progress Notes (Signed)
Inpatient Rehabilitation  Patient information reviewed and entered into eRehab system by Lorely Bubb Zyana Amaro, OTR/L, Rehab Quality Coordinator.   Information including medical coding, functional ability and quality indicators will be reviewed and updated through discharge.   

## 2022-02-16 NOTE — Care Management (Addendum)
Inpatient [facility] Individual Statement of Services  Patient Name:  JOHNTAE BROXTERMAN  Date:  02/16/2022  Welcome to the [facility].  Our goal is to provide you with an individualized program based on your diagnosis and situation, designed to meet your specific needs.  With this comprehensive rehabilitation program, you will be expected to participate in at least 3 hours of rehabilitation therapies Monday-Friday, with modified therapy programming on the weekends.  Your rehabilitation program will include the following services:  Physical Therapy (PT), Occupational Therapy (OT), Speech Therapy (ST), 24 hour per day rehabilitation nursing, Therapeutic Recreaction (TR), Psychology, Neuropsychology, Care Coordinator, Rehabilitation Medicine, [place], and Other  Weekly team conferences will be held on Tuesdays to discuss your progress.  Your Inpatient Rehabilitation Care Coordinator will talk with you frequently to get your input and to update you on team discussions.  Team conferences with you and your family in attendance may also be held.  Expected length of stay: 2-3 weeks    Overall anticipated outcome: Minimal Assistance  Depending on your progress and recovery, your program may change. Your Inpatient Rehabilitation Care Coordinator will coordinate services and will keep you informed of any changes. Your Inpatient Rehabilitation Care Coordinator's name and contact numbers are listed  below.  The following services may also be recommended but are not provided by the [facility] will be made to provide these services after discharge if needed.  Arrangements include referral to agencies that provide these services.  Your insurance has been verified to be:  [employer]  Your primary  doctor is:  Vassie Loll Eyk  Pertinent information will be shared with your doctor and your insurance company.  Inpatient Rehabilitation Care Coordinator:  Cathleen Corti [phone] or [phone]  Information discussed with and copy given to patient by: Rana Snare, 02/16/2022, 2:20 PM

## 2022-02-16 NOTE — Progress Notes (Signed)
PROGRESS NOTE   Subjective/Complaints:  No acute events overnight. No complaints other than breakfast unappetizing this AM. Denies any further diarrheal stools, although is unsure when last BM was because "I just wear briefs and they change them so I don't always know."  ROS: Patient denies fever, rash, sore throat, blurred vision, dizziness, nausea, vomiting, diarrhea, cough, shortness of breath or chest pain, joint or back/neck pain,   or mood change.    Objective:   No results found. Recent Labs    02/15/22 0930  WBC 7.5  HGB 9.8*  HCT 29.1*  PLT 209   Recent Labs    02/15/22 0930  NA 141  K 3.0*  CL 105  CO2 26  GLUCOSE 193*  BUN <5*  CREATININE 0.42*  CALCIUM 9.1    Intake/Output Summary (Last 24 hours) at 02/16/2022 9528 Last data filed at 02/16/2022 0700 Gross per 24 hour  Intake 601 ml  Output --  Net 601 ml         Physical Exam: Vital Signs Blood pressure (!) 149/86, pulse 75, temperature 98.7 F (37.1 C), temperature source Oral, resp. rate 18, height 6\' 1"  (1.854 m), weight 81 kg, SpO2 100 %.  General: Alert and oriented x 3, No apparent distress HEENT: Large cranioplasty w/ associated swelling present on right; mild.  EOMI, sclera anicteric, oral mucosa pink and moist, poor dentition  Neck: Supple without JVD or lymphadenopathy Heart: Reg rate and rhythm. No murmurs rubs or gallops Chest: CTA bilaterally without wheezes, rales, or rhonchi; no distress Abdomen: Soft, non-tender, non-distended, bowel sounds positive. Extremities: No clubbing, cyanosis, or edema. Pulses are 2+ Psych: Pt's affect is appropriate. Pt is cooperative Skin: Crani incision c/d/I, with staples present. Mild area where scabbing has peeled away, no drianage.  Neuro:  pt is alert and orientedx3. Mild difficulty processing. Left central 7, mild dysarthria.  Musculoskeletal: LUE tight wrist and fingers.   From prior  exams, not repeated today: Left HH with inattention. LUE 0/5 prox to distal. LLE 1+ to 2/5 quads,HF; hamstrings and HAD 1/5; 0/5 ADF/PF.  Mild finger flexor contractures left hand. LLE easily flexed to neutral. DTR's 2-3+ left side    Assessment/Plan: 1. Functional deficits which require 3+ hours per day of interdisciplinary therapy in a comprehensive inpatient rehab setting. Physiatrist is providing close team supervision and 24 hour management of active medical problems listed below. Physiatrist and rehab team continue to assess barriers to discharge/monitor patient progress toward functional and medical goals  Care Tool:  Bathing        Body parts bathed by helper: Right arm, Left arm, Chest, Abdomen, Front perineal area, Buttocks, Left lower leg, Right lower leg, Left upper leg, Right upper leg, Face     Bathing assist Assist Level: Dependent - Patient 0%     Upper Body Dressing/Undressing Upper body dressing   What is the patient wearing?: Pull over shirt    Upper body assist Assist Level: Maximal Assistance - Patient 25 - 49%    Lower Body Dressing/Undressing Lower body dressing      What is the patient wearing?: Pants, Incontinence brief     Lower body assist Assist  for lower body dressing: Total Assistance - Patient < 25%     Toileting Toileting Toileting Activity did not occur (Clothing management and hygiene only): N/A (no void or bm)  Toileting assist       Transfers Chair/bed transfer  Transfers assist     Chair/bed transfer assist level: Moderate Assistance - Patient 50 - 74%     Locomotion Ambulation   Ambulation assist      Assist level: Maximal Assistance - Patient 25 - 49% Assistive device: Walker-rolling Max distance: 2 feet   Walk 10 feet activity   Assist  Walk 10 feet activity did not occur: Safety/medical concerns        Walk 50 feet activity   Assist Walk 50 feet with 2 turns activity did not occur: Safety/medical  concerns         Walk 150 feet activity   Assist Walk 150 feet activity did not occur: Safety/medical concerns         Walk 10 feet on uneven surface  activity   Assist Walk 10 feet on uneven surfaces activity did not occur: Safety/medical concerns         Wheelchair     Assist Is the patient using a wheelchair?: Yes Type of Wheelchair: Manual    Wheelchair assist level: Supervision/Verbal cueing Max wheelchair distance: 106 ft    Wheelchair 50 feet with 2 turns activity    Assist        Assist Level: Supervision/Verbal cueing   Wheelchair 150 feet activity     Assist      Assist Level: Moderate Assistance - Patient 50 - 74%   Blood pressure (!) 149/86, pulse 75, temperature 98.7 F (37.1 C), temperature source Oral, resp. rate 18, height 6\' 1"  (1.854 m), weight 81 kg, SpO2 100 %.  Medical Problem List and Plan: 1. Functional deficits secondary to debility after cranioplasty and C-Diff in setting of prior right MCA stroke, left hemiplegia             -patient may not yet shower             -ELOS/Goals: 12-14 days, mod assist with PT, OT, SLP             -pt has a resting WHO for LUE             -will go ahead and order PRAFO to maintain heel cord stretch  -Patient is beginning CIR therapies today including PT, OT, and SLP  2.  Antithrombotics: -DVT/anticoagulation:  Pharmaceutical: Eliquis             -antiplatelet therapy: none 3. Pain Management: Tylenol, oxycodone, hydrocodone, Robaxin as needed - no PRNs used 4. Mood/Behavior/Sleep: LCSW to evaluate and provide emotional support             -antipsychotic agents: n/a             -depression: continue Lexapro 20 mg daily             -insomnia: continue melatonin 5. Neuropsych/cognition: This patient is capable of making decisions on his own behalf. 6. Skin/Wound Care: routine skin care checks             -monitor scalp and abdominal surgical incisions - Surgery 9/18, will check in for  est. staple removal 10/2             -scalp remains clean and intact currently 7. Fluids/Electrolytes/Nutrition:               -  carb modified diet  -can dc IVF  9/24-Potassium 3.0--add supplement    -BUN/Cr wnl    -encourage PO 8: DM: Hgb A1c = 8.1; on SSI             -consider restarting home Lantus and Novolog - will monitor BG and adjust 9/25 if continued highs             -CBG (last 3)  Recent Labs    02/15/22 1634 02/15/22 2105 02/16/22 0610  GLUCAP 100* 219* 163*     -9/24 fair control. Hold scheduled insulin for now 9: Hypertension: monitor             -continue clonidine patch 0.3 mg weekly             -continue amlodipine 10 mg daily             -continue Lopressor 25 mg BID             -continue lisinopril 40 mg daily             - BP well controlled ~130s; monitor    02/16/2022    4:51 AM 02/15/2022    7:31 PM 02/15/2022    2:44 PM  Vitals with BMI  Systolic 149 132 756  Diastolic 86 87 90  Pulse 75 93 72     10: C. diff diarrhea: continue oral vanc, end 9/30; discontinue Colace for now             -:BM 9/24, 1x, Liquid    LOS: 2 days A FACE TO FACE EVALUATION WAS PERFORMED  Angelina Sheriff 02/16/2022, 9:27 AM

## 2022-02-16 NOTE — Discharge Summary (Signed)
Physician Discharge Summary  Patient ID: Jacob [name] MRN: [id number] DOB/AGE: 1976-03-21 46 y.o.  Admit date: 02/14/2022 Discharge date: 03/07/2022  Discharge Diagnoses:  Principal Problem:   Debility Active Problems:   Type 2 diabetes mellitus with hyperglycemia, with long-term current use of insulin (HCC) Active problems:  Functional deficits secondary to prior CVA with decompressive craniectomy s/p cranioplasty  Right peroneal DVT C. diff diarrhea Depression Insomnia DM Hypertension Left shoulder subluxation Urinary incontinence  Discharged Condition: good  Significant Diagnostic Studies:  Labs:  Basic Metabolic Panel: Recent Labs  Lab 03/01/22 0643  NA 141  K 3.8  CL 105  CO2 28  GLUCOSE 131*  BUN 8  CREATININE 0.62  CALCIUM 10.1    CBC: Recent Labs  Lab 03/01/22 0643  WBC 5.1  HGB 11.0*  HCT 33.0*  MCV 83.1  PLT 256    CBG: Recent Labs  Lab 03/05/22 1215 03/05/22 1638 03/05/22 2034 03/06/22 0555 03/06/22 1122  GLUCAP 201* 131* 215* 138* 218*    Brief HPI:   Jacob [name] is a 46 y.o. male with a history of prior CVA with decompressive craniectomy in April 2003.  He presented for elective cranioplasty.  He has a history of right peroneal DVT on Eliquis.  He has left hemiparesis from prior stroke.  During his postoperative course, his stools were positive for C. difficile toxin and is on oral vancomycin.    Hospital Course: Jacob [name] was admitted to rehab 02/14/2022 for inpatient therapies to consist of PT, ST and OT at least three hours five days a week. Past admission physiatrist, therapy team and rehab RN have worked together to provide customized collaborative inpatient rehab. PRAFO ordered to maintain left heel cord stretch. Potassium level 3.0 and given supplementation. Stool output improving.  Left shoulder subluxation treated with OT taping and ROM, sling when not mobilizing, scheduled Tylenol, oxycodone 2.5/5 mg as needed. Surgical  staples removed on 9/29. Finished vancomycin for c diff on 9/30. Incision continues to heal nicely. Ritalin added on 10/4 to improve processing speed and attention.  Baclofen 10 mg twice daily added for worsening left upper extremity and left lower extremity tone.  This was increased to 10 mg 3 times daily on 10/4 with improvement in symptoms. Reduced Ritalin dose to 5 twice daily for verbose, perseverative behavior on 10/5.  Discontinued on 10/6. Consider Dantrolene as alternative for shoulder pain as outpatient.   Blood pressures were monitored on TID basis and controlled on clonidine, amlodipine, Lopressor and lisinopril.  Diabetes has been monitored with ac/hs CBG checks and SSI was use prn for tighter BS control. Consistent use of Ssi 3-9 units with increased needs at nighttime. Lantus 3 units daily started on 9/30. Increased to 5 units on 10/2 and to 8 units on 10/4, to 10 units on 10/6.   Rehab course: During patient's stay in rehab weekly team conferences were held to monitor patient's progress, set goals and discuss barriers to discharge. At admission, patient required mod to max assist with mobility, mod to total assist with ADLs.  He has had improvement in activity tolerance, balance, postural control as well as ability to compensate for deficits. He has had improvement in functional use RUE/LUE  and RLE/LLE as well as improvement in awareness   Disposition: Home There are no questions and answers to display.       Diet: carb modified  Special Instructions: No driving, alcohol consumption or tobacco use.   Discuss toe block and home  LLE AFO with orthotist.  30-35 minutes were spent on discharge planning and discharge summary.  Allergies as of 03/06/2022       Reactions   Hydrochlorothiazide Other (See Comments)   Bilateral muscle cramping        Medication List     STOP taking these medications    albuterol (2.5 MG/3ML) 0.083% nebulizer solution Commonly known  as: PROVENTIL   bisacodyl 10 MG suppository Commonly known as: DULCOLAX   carvedilol 25 MG tablet Commonly known as: COREG   chlorproMAZINE 10 MG tablet Commonly known as: THORAZINE   famotidine 20 MG tablet Commonly known as: PEPCID   HumaLOG KwikPen 100 UNIT/ML KwikPen Generic drug: insulin lispro   insulin detemir 100 UNIT/ML injection Commonly known as: LEVEMIR   linezolid 600 MG tablet Commonly known as: ZYVOX   melatonin 3 MG Tabs tablet   ondansetron 4 MG/2ML Soln injection Commonly known as: ZOFRAN   oxyCODONE 5 MG immediate release tablet Commonly known as: Oxy IR/ROXICODONE   pantoprazole sodium 40 mg Commonly known as: PROTONIX   polyethylene glycol 17 g packet Commonly known as: MIRALAX / GLYCOLAX   topiramate 50 MG tablet Commonly known as: TOPAMAX   traZODone 50 MG tablet Commonly known as: DESYREL       TAKE these medications    acetaminophen 325 MG tablet Commonly known as: TYLENOL Take 650 mg by mouth every 12 (twelve) hours as needed for moderate pain. What changed: Another medication with the same name was removed. Continue taking this medication, and follow the directions you see here.   amLODipine 10 MG tablet Commonly known as: NORVASC Take 1 tablet (10 mg total) by mouth daily.   apixaban 5 MG Tabs tablet Commonly known as: ELIQUIS Take 1 tablet (5 mg total) by mouth 2 (two) times daily.   atorvastatin 40 MG tablet Commonly known as: LIPITOR Place 1 tablet (40 mg total) into feeding tube daily. What changed: how to take this   baclofen 10 MG tablet Commonly known as: LIORESAL Take 1 tablet (10 mg total) by mouth 3 (three) times daily.   cloNIDine 0.3 mg/24hr patch Commonly known as: CATAPRES - Dosed in mg/24 hr Place 1 patch (0.3 mg total) onto the skin once a week. Start taking on: March 11, 2022   escitalopram 20 MG tablet Commonly known as: LEXAPRO Take 1 tablet (20 mg total) by mouth daily.   feeding  supplement Liqd Take 237 mLs by mouth 2 (two) times daily between meals. What changed: when to take this   insulin aspart 100 UNIT/ML injection Commonly known as: novoLOG Inject 0-20 Units into the skin 3 (three) times daily with meals. CBG < 70 call MD. CBG 70-120 give 0 units; 121-150 give 3 units; 151-200 give 4 units, 201-250 give 7 units; 251-300 give 11 units; 301-350 give 15 units; 351-400 give 20 units; greater than 400 call MD What changed:  how much to take when to take this additional instructions   insulin glargine 100 UNIT/ML injection Commonly known as: LANTUS Inject 0.1 mLs (10 Units total) into the skin daily. What changed:  how much to take when to take this   lisinopril 40 MG tablet Commonly known as: ZESTRIL Take 1 tablet (40 mg total) by mouth daily. What changed: how to take this   metoprolol tartrate 25 MG tablet Commonly known as: LOPRESSOR Take 1 tablet (25 mg total) by mouth 2 (two) times daily.   potassium chloride SA 20 MEQ tablet Commonly known  asRhetta Mura Take 1 tablet (20 mEq total) by mouth 2 (two) times daily.        Follow-up Information     Gertie Gowda, DO Follow up.   Specialty: Physical Medicine and Rehabilitation Why: office will call you to arrange your appt (sent) Contact information: [address] [place] [address] [phone]         Townsend Roger, MD Follow up.   Specialty: Internal Medicine Why: Call in 1-2 days to make arrangements for hospital follow-up appointment Contact information: [address] [place] [address] [phone]         Newman Pies, MD Follow up.   Specialty: Neurosurgery Why: Call in 1-2 days to make arrangements for hospital follow-up appointment Contact information: [address] [place] [address] [phone]                 Signed: Barbie Banner 03/06/2022, 2:24 PM

## 2022-02-16 NOTE — Progress Notes (Signed)
Inpatient Rehabilitation Care Coordinator Assessment and Plan Patient Details  Name: Jacob [name] MRN: [id number] Date of Birth: 10/12/1975  Today's Date: 02/16/2022  [name] Problems: Principal Problem:   Debility  Past Medical History:  Past Medical History:  Diagnosis Date   Allergy    Diabetes mellitus without complication ([facility])    GERD (gastroesophageal reflux disease)    Hypertension    Paralysis ([facility])    Stroke Jacob [name])    Past Surgical History:  Past Surgical History:  Procedure Laterality Date   APPENDECTOMY     BUBBLE STUDY  10/02/2021   Procedure: BUBBLE STUDY;  Surgeon: Fay Records, MD;  Location: [facility];  Service: Cardiovascular;;   CRANIOPLASTY Right 02/09/2022   Procedure: Craniectomy with Replacement of Bone Flap From the Abdomen;  Surgeon: Newman Pies, MD;  Location: [facility];  Service: Neurosurgery;  Laterality: Right;   CRANIOTOMY Right 09/15/2021   Procedure: RIGHT DECOMPRESSIVE CRANIOTOMY, PLACEMENT OF BONE FLAP IN ABDOMEN;  Surgeon: Newman Pies, MD;  Location: [facility];  Service: Neurosurgery;  Laterality: Right;   IR CT HEAD LTD  09/12/2021   IR PERCUTANEOUS ART THROMBECTOMY/INFUSION INTRACRANIAL INC DIAG ANGIO  09/12/2021   IR US GUIDE VASC ACCESS RIGHT  09/12/2021   LAPAROSCOPIC APPENDECTOMY  10/24/2013   Procedure: APPENDECTOMY LAPAROSCOPIC;  Surgeon: Shann Medal, MD;  Location: [facility];  Service: General;;   RADIOLOGY WITH ANESTHESIA N/A 09/12/2021   Procedure: IR WITH ANESTHESIA;  Surgeon: Luanne Bras, MD;  Location: [facility];  Service: Radiology;  Laterality: N/A;   TEE WITHOUT CARDIOVERSION N/A 10/02/2021   Procedure: TRANSESOPHAGEAL ECHOCARDIOGRAM (TEE);  Surgeon: Fay Records, MD;  Location: [name];  Service: Cardiovascular;  Laterality: N/A;   Social History:  reports that he has never smoked. He has never used smokeless tobacco. He reports that he does not currently use alcohol. He reports that he does not use drugs.  Family /  Support Systems Marital Status: Married How Long?: 6 years Patient Roles: Spouse, Parent Spouse/Significant Other: Jacob [name] (wife)-  [phone] Children: Blended family. Together they have a total of 10 children. They share a 94 yr old [place] together.Pt has 5 children from previous relationship, and pt has 3 children from previous relationship. Other Supports: PRN support from their older children Anticipated Caregiver: Wife Ability/Limitations of Caregiver: No concerns reported Caregiver Availability: 24/7 Family Dynamics: Pt lives with his wife and 11 yr old son.  Social History Preferred language: English Religion: Unknown Cultural Background: Pt works with heavy Librarian, academic (fork lifts). Education: some Medical sales representative - How often do you need to have someone help you when you read instructions, pamphlets, or other written material from your doctor or pharmacy?: Never Writes: Yes Employment Status: Employed Name of Employer: [employer] Length of Employment: 5 (years) Return to Work Plans: Pt currently on short term disability which will end soon. Has already applied for SSDI. Legal History/Current Legal Issues: Pt admits to driving wihtout license in 2683 but no other issues since then. Guardian/Conservator: N/A   Abuse/Neglect Abuse/Neglect Assessment Can Be Completed: Yes Physical Abuse: Denies Verbal Abuse: Denies Sexual Abuse: Denies Exploitation of patient/patient's resources: Denies Self-Neglect: Denies  Patient response to: Social Isolation - How often do you feel lonely or isolated from those around you?: Never  Emotional Status Pt's affect, behavior and adjustment status: Pt in good spirits at time of visit. Pt eager to leave and get back home. Recent Psychosocial Issues: Denies Psychiatric History: Denies Substance Abuse History: Pt admits to drinking  alcohol on occassion. No tobacco products or rec drug use.  Patient / Family Perceptions, Expectations &  Goals Pt/Family understanding of illness & functional limitations: Patient's wife has a general understanding of pt care needs Premorbid pt/family roles/activities: Independent Anticipated changes in roles/activities/participation: Assistance with ADLs/IADLs Pt/family expectations/goals: Pt goal is to work on walking so he can walk by himself.  Community Resources Community Agencies: None Premorbid Home Care/DME Agencies: None Transportation available at discharge: Wife Is the patient able to respond to transportation needs?: Yes In the past 12 months, has lack of transportation kept you from medical appointments or from getting medications?: No In the past 12 months, has lack of transportation kept you from meetings, work, or from getting things needed for daily living?: No Resource referrals recommended: Neuropsychology  Discharge Planning Living Arrangements: Spouse/significant other, Children Support Systems: Spouse/significant other, Children Type of Residence: Private residence Insurance Resources: Private Insurance (specify), Medicaid (specify county) (BCBS- ends 9/30. [place] Medicaid (secondary and becomes primary 10/1)) Financial Resources: Employment, Other (Comment) (short term disability through employer) Financial Screen Referred: No Living Expenses: Mortgage Money Management: Patient, Spouse Does the patient have any problems obtaining your medications?: No Home Management: Pt reports his wife managed all home care needs Patient/Family Preliminary Plans: TBD Care Coordinator Barriers to Discharge: Decreased caregiver support, Lack of/limited family support, Insurance for SNF coverage Care Coordinator Anticipated Follow Up Needs: HH/OP Expected length of stay: 2-3 weeks  Clinical Impression SW met with pt in room to introduce self, explain role, and discuss discharge process. Pt is not a veteran. No HCPOA. DME: RW, 3in1 Bsc, and wheelchair (in room with him).    1421-SW made contact with pt wife Jacob [name] to introduce self, explain role, and discuss discharge process. She has already made contact with pt Jacob Medicaid to discuss PCS services. SW will confirm BCBS MCD will follow-up at discharge. SW shared ELOS. She is aware there will be follow-up after team conference.    A  02/16/2022, 4:12 PM    

## 2022-02-16 NOTE — Progress Notes (Signed)
Attempted to meet with patient. In therapy at this time.

## 2022-02-17 LAB — GLUCOSE, CAPILLARY
Glucose-Capillary: 171 mg/dL — ABNORMAL HIGH (ref 70–99)
Glucose-Capillary: 238 mg/dL — ABNORMAL HIGH (ref 70–99)
Glucose-Capillary: 149 mg/dL — ABNORMAL HIGH (ref 70–99)
Glucose-Capillary: 149 mg/dL — ABNORMAL HIGH (ref 70–99)

## 2022-02-17 MED ORDER — OXYCODONE HCL 5 MG PO TABS: 2.5000 mg | ORAL_TABLET | ORAL | Status: DC | PRN

## 2022-02-17 MED ORDER — ACETAMINOPHEN 500 MG PO TABS
1000.0000 mg | ORAL_TABLET | Freq: Three times a day (TID) | ORAL | Status: DC
  Administered 2022-02-17 – 2022-02-18 (×5): 1000 mg via ORAL
  Administered 2022-02-19: 500 mg via ORAL
  Administered 2022-02-19: 1000 mg via ORAL
  Administered 2022-02-19: 500 mg via ORAL
  Administered 2022-02-19 – 2022-03-07 (×47): 1000 mg via ORAL
  Filled 2022-02-17 (×55): qty 2

## 2022-02-17 MED ORDER — OXYCODONE HCL 5 MG PO TABS: 5.0000 mg | ORAL_TABLET | ORAL | Status: DC | PRN

## 2022-02-17 NOTE — Progress Notes (Signed)
Occupational Therapy Session Note  Patient Details  Name: Jacob [name] MRN: [id number] Date of Birth: [birth year]  Today's Date: 02/17/2022 OT Individual Time: 1308-6578 OT Individual Time Calculation (min): 54 min    Short Term Goals: Week 1:  OT Short Term Goal 1 (Week 1): Patient will don pull over shirt with no more than mod assist OT Short Term Goal 2 (Week 1): Patient will bathe face, chest, abdomen, upper thighs and below knees following set up and cueing with min assist OT Short Term Goal 3 (Week 1): Patient will transfer to commode with min assist OT Short Term Goal 4 (Week 1): Patient will complete oral care with min assist OT Short Term Goal 5 (Week 1): Patient will tolerate passive stretch to LUE to 90* of shoulder flexion, and at least 20* of external rotation without significant increase in report of pain.  Skilled Therapeutic Interventions/Progress Updates:    Pt greeted semi-reclined in bed and agreeable to OT treatment session. Pt completed bed mobility with mod A. Good sitting balance at overall close supervision static and min A when reaching. Pt completed mod A squatpivot to wc. Pt brought to the sink for bathing/dressing tasks. Mod A for UB bathing with no activation of L UE. Pt reports pain to L UE with PROM. Pt with approx 2 finger sublux and hand contracture- unable to get fingers into full extension. Pt able to recall hemi-dressing techniques but still needed mod A to thread L sleeve and pull over shoulder.Max A to thread pant legs, then could stand at the sink with min A. Min/mod A for balance, but then OT able to remove R UE to wash peri-area and buttocks while also weight bearing through L hand. OT continued PROM to L UE and gentle massage. Pt left seated in wc with alarm belt on, call bell in reach, and needs met.   Therapy Documentation Precautions:  Precautions Precautions: Fall Precaution Comments: L hemiplegia; painful LUE/shoulder; Required Braces or  Orthoses: Splint/Cast Splint/Cast: WHO - Resting hand splint at night Restrictions Weight Bearing Restrictions: No Pain: 8/10 pain in L shoulder and elbow. Gentle stretching and ROM, repositioned   Therapy/Group: Individual Therapy  Valma Cava 02/17/2022, 9:55 AM

## 2022-02-17 NOTE — Progress Notes (Signed)
Patient ID: Jacob [name], male   DOB: [birth year], 46 y.o.   MRN: [id number]  1127-SW spoke with pt wife to provide updates from team conference, and d/c date 10/14. SW discussed support at discharge since she works at discussed scheduling family edu closer towards with her and children. SW discussed submitting CAP/DA referral since she has to work. SW will confirm d/c recommendations once confirmed.   SW made referral with [facility]-CAP/DA referral with [facility] [phone]).    Loralee Pacas, MSW, [place] Office: [phone] Cell: [phone] Fax: [phone]

## 2022-02-17 NOTE — Discharge Instructions (Addendum)
Inpatient Rehab Discharge Instructions  Jacob [name] Discharge date and time: 03/07/2022  Activities/Precautions/ Functional Status: Activity: no lifting, driving, or strenuous exercise until cleared by MD Diet: cardiac diet Wound Care: keep wound clean and dry Functional status:  ___ No restrictions     ___ Walk up steps independently ___ 24/7 supervision/assistance   ___ Walk up steps with assistance __x_ Intermittent supervision/assistance  ___ Bathe/dress independently ___ Walk with walker     ___ Bathe/dress with assistance ___ Walk Independently    ___ Shower independently ___ Walk with assistance    __x_ Shower with assistance __x_ No alcohol     ___ Return to work/school ________  Special Instructions: No driving, alcohol consumption or tobacco use.    COMMUNITY REFERRALS UPON DISCHARGE:    Home Health:   PT       OT       SLP                          Agency: [facility]                Phone: [phone] *Please expect follow-up within 2-3 days to schedule your  home visit. If you have not received follow-up be sure to contact the branch directly.*   Medical Equipment/Items Ordered: hemi-walker, wheelchair and left lap tray                                                 Agency/Supplier: [facility] [phone]  GENERAL COMMUNITY RESOURCES FOR PATIENT/FAMILY: The following referrals have been made:   1) [facility] CAP/DA referral. Please keep in mind the packet will be coming from the [place] office and typically mails within 7-10 business days from the time in which the referral was made (9/26). Sometimes, [facility] is behind on sending on referral packets. If you have questions, please contact CAP/DA office at [phone].  2) [facility] referral was submitted through your insurance with [employer] (Long term services Department). To check status of referral and when a nurse will be sent to your home, contact [phone].   *If transportation services are  required in the future, be sure to contact [employer] [employer] [phone] within 3-5 business days to have a trip scheduled.*    My questions have been answered and I understand these instructions. I will adhere to these goals and the provided educational materials after my discharge from the hospital.  Patient/Caregiver Signature _______________________________ Date __________  Clinician Signature _______________________________________ Date __________  Please bring this form and your medication list with you to all your follow-up doctor's appointments.

## 2022-02-17 NOTE — IPOC Note (Signed)
Overall Plan of Care [facility]) Patient Details Name: Jacob [name] MRN: [id number] DOB: 1975/08/09  Admitting Diagnosis: Debility  Hospital Problems: Principal Problem:   Debility     Functional Problem List: Nursing Bladder, Bowel, Pain, Perception, Safety, Endurance, Sensory, Medication Management, Skin Integrity, Motor  PT Balance, Pain, Behavior, Perception, Safety, Edema, Endurance, Sensory, Skin Integrity, Motor, Nutrition  OT Balance, Vision, Behavior, Pain, Cognition, Perception, Safety, Endurance, Sensory, Edema, Motor  SLP Cognition  TR         Basic ADL's: OT Eating, Grooming, Bathing, Dressing, Toileting     Advanced  ADL's: OT       Transfers: PT Bed Mobility, Bed to Chair, Car, Occupational psychologist, Research scientist (life sciences): PT Ambulation, Psychologist, prison and probation services, Stairs     Additional Impairments: OT Fuctional Use of Upper Extremity  SLP Social Cognition   Social Interaction, Problem Solving, Memory, Attention, Awareness  TR      Anticipated Outcomes Item Anticipated Outcome  Self Feeding supervision  Swallowing  Sup A   Basic self-care  Min assist  Toileting  Min assist   Bathroom Transfers Min assist  Bowel/Bladder  continent x 2  Transfers  min A-CGA using LRAD  Locomotion  min A using LRAD 150 ft  Communication  N/A  Cognition  Min A  Pain  less than 3  Safety/Judgment  to remain fall free while in rehab   Therapy Plan: PT Intensity: Minimum of 1-2 x/day ,45 to 90 minutes PT Frequency: 5 out of 7 days PT Duration Estimated Length of Stay: 2.5-3 weeks OT Intensity: Minimum of 1-2 x/day, 45 to 90 minutes OT Frequency: 5 out of 7 days OT Duration/Estimated Length of Stay: 14-18 days SLP Intensity: Minumum of 1-2 x/day, 30 to 90 minutes SLP Frequency: 3 to 5 out of 7 days SLP Duration/Estimated Length of Stay: 2.5-3 weeks   Team Interventions: Nursing Interventions Patient/Family Education, Disease Management/Prevention, Skin  Care/Wound Management, Discharge Planning, Bladder Management, Pain Management, Psychosocial Support, Cognitive Remediation/Compensation, Bowel Management, Medication Management  PT interventions Ambulation/gait training, Discharge planning, Cognitive remediation/compensation, DME/adaptive equipment instruction, Functional mobility training, Pain management, Psychosocial support, Therapeutic Activities, Splinting/orthotics, UE/LE Strength taining/ROM, Visual/perceptual remediation/compensation, Wheelchair propulsion/positioning, UE/LE Coordination activities, Therapeutic Exercise, Stair training, Skin care/wound management, Patient/family education, Neuromuscular re-education, Functional electrical stimulation, Disease management/prevention, Firefighter, Warden/ranger  OT Interventions Warden/ranger, Neuromuscular re-education, Patient/family education, Self Care/advanced ADL retraining, Splinting/orthotics, Therapeutic Exercise, UE/LE Coordination activities, Wheelchair propulsion/positioning, Visual/perceptual remediation/compensation, UE/LE Strength taining/ROM, Therapeutic Activities, Pain management, Functional mobility training, DME/adaptive equipment instruction, Discharge planning, Cognitive remediation/compensation  SLP Interventions Cognitive remediation/compensation, Patient/family education, Dysphagia/aspiration precaution training, Internal/external aids, Functional tasks  TR Interventions    SW/CM Interventions Discharge Planning, Patient/Family Education, Psychosocial Support   Barriers to Discharge MD  Medical stability, Home enviroment access/loayout, Incontinence, Wound care, Lack of/limited family support, Insurance for SNF coverage, Medication compliance, and Behavior  Nursing Decreased caregiver support, Home environment access/layout, Incontinence, Wound Care, Other (comments) (cdiff)    PT Home environment access/layout    OT      SLP       SW Decreased caregiver support, Lack of/limited family support, Community education officer for SNF coverage     Team Discharge Planning: Destination: PT-Home ,OT- Home , SLP-Home Projected Follow-up: PT-Outpatient PT, OT-  Outpatient OT, SLP-Outpatient SLP, 24 hour supervision/assistance Projected Equipment Needs: PT-To be determined, OT- None recommended by OT, SLP-None recommended by SLP Equipment Details: PT-Pt has hospital bed and reclining back manual w/c, unsure about  RW, OT-Will monitor - currently patient reports he has a BSC and shower seat.  Patient has obtained a reclinig w/c Patient/family involved in discharge planning: PT- Patient,  OT-Patient, SLP-Patient  MD ELOS: 14-21 days Medical Rehab Prognosis:  Fair Assessment: The patient has been admitted for CIR therapies with the diagnosis of debility after cranioplasty and C-Diff in setting of prior right MCA stroke, left hemiplegia. The team will be addressing functional mobility, strength, stamina, balance, safety, adaptive techniques and equipment, self-care, bowel and bladder mgt, patient and caregiver education, pain control, and cognition. Goals have been set at [place]. Anticipated discharge destination is home.       See Team Conference Notes for weekly updates to the plan of care     Gertie Gowda, DO 02/17/2022

## 2022-02-17 NOTE — Progress Notes (Signed)
Physical Therapy Session Note  Patient Details  Name: Jacob [name] MRN: [id number] Date of Birth: 10-Oct-1975  Today's Date: 02/17/2022 PT Individual Time: 0950-1030 and 1345-1445 PT Individual Time Calculation (min): 40 min and 60 min  Short Term Goals: Week 1:  PT Short Term Goal 1 (Week 1): Patient will perform bed mobility with min A consistently. PT Short Term Goal 2 (Week 1): Patient will perform basic transfers with min A >50% of the time. PT Short Term Goal 3 (Week 1): Patient will ambulate >100 ft using LRAD with mod A.  Skilled Therapeutic Interventions/Progress Updates:     1st Session: Pt received seated in [facility] and agrees to therapy. No complaint of pain. WC transport to gym for time management. PT applies shoe covering and ace wrap on L lower extremity to promote dorsiflexion and eversion. Pt performs multiple bouts of ambulation during session. Initially pt attempts to ambulate with RW and L hand splint, completing ~5' prior to requesting seated rest break due to pain in L hand/wrist. Pt also requiring maxA to manage RW and L hand splint not effectively stabilizing L hand. Activity adjusted and pt performs remainder of gait training with R hand rail along wall. Pt ambulates 3x30' with modA and +2 for WC follow. PT provides multimodal cueing for posture, lateral weight shifting, blocking L knee and providing manual facilitation of L hip extension during stance phase. Pt takes seated rest breaks between each bout. L knee buckles frequently when not blocked adequately and pt also requires manual assistance to complete functional step length with L lower extremity, though he is able to initiate swing phase on L side for each gait cycle.   Pt performs stand pivot transfer to recliner with modA and cues for sequencing. Left seated with all needs within reach.  2nd Session: Pt received seated in recliner and agrees to therapy. No complaint of pain. Pt performs stand pivot transfer to [facility]  with maxA and cues for sequencing and positioning. Pt noted to have been incontinent of bowel upon sitting in WC. Pt transfers back to bed with maxA and stand pivot technique, with cues for hand placement and sequencing. Sit to supine with modA management of bilateral lower extremities. Pt requests that NT perform hygiene so PT leaves room. Pt misses 15 minutes of skilled PT due to incontinence and completing hygiene with RN staff. Upon return, pt sitting on side of bed. Stand pivot transfer to [facility] with modA and cues for step sequencing, body mechanics, and positioning. WC transport to gym. Pt standing activity to challenge balance, provide multiple opportunities for sit to stand, and promote increased WB through L lower extremity. Pt tasked with placing playing cards on matching card at or above eye level. PT provides modA for sit to stand with multimodal cues for weight shifting, initiation, and body mechanics. Pt requires modA in standing to block L knee as well as providing manual facilitation of hip extension. Pt requires multiple seated rest breaks during activity.   WC transport back to room. Stand pivot to bed with modA and cues for sequencing. ModA for sit to supine with management of bilateral lower extremities. Pt left with alarm intact and all needs within reach.  Therapy Documentation Precautions:  Precautions Precautions: Fall Precaution Comments: L hemiplegia; painful LUE/shoulder; Required Braces or Orthoses: Splint/Cast Splint/Cast: WHO - Resting hand splint at night Restrictions Weight Bearing Restrictions: No   Therapy/Group: Individual Therapy  Breck Coons, PT, DPT 02/17/2022, 5:09 PM

## 2022-02-17 NOTE — Progress Notes (Signed)
Orthopedic Tech Progress Note Patient Details:  Jacob [name] 21-Dec-1975 [id number]  Ortho Devices Type of Ortho Device: Arm sling Ortho Device/Splint Location: LUE Ortho Device/Splint Interventions: Ordered, Application, Adjustment   Post Interventions Patient Tolerated: Well Instructions Provided: Care of device  Janit Pagan 02/17/2022, 1:31 PM

## 2022-02-17 NOTE — Progress Notes (Signed)
Physical Therapy Note  Patient Details  Name: OSKAR CRETELLA MRN: [id number] Date of Birth: June 26, 1975 Today's Date: 02/17/2022    Pt presents supine in bed and refuses PT politely.  Pt states that he is too tired after previous therapy and does not want to get back up.  Pt remained in bed w/ all needs in reach and bed alarm on.  Missed time of 30 min.   Ladoris Gene 02/17/2022, 3:22 PM

## 2022-02-17 NOTE — Patient Care Conference (Signed)
Inpatient RehabilitationTeam Conference and Plan of Care Update Date: 02/17/2022   Time: 10:32 AM    Patient Name: Jacob [name]      Medical Record Number: [id number]  Date of Birth: Oct 25, 1975 Sex: Male         Room/Bed: 4M04C/4M04C-01 Payor Info: Payor: [place] / Plan: [employer] COMM PPO / Product Type: *No Product type* /    Admit Date/Time:  02/14/2022  4:07 PM  Primary Diagnosis:  [facility] Problems: Principal Problem:   Debility    Expected Discharge Date: Expected Discharge Date: 03/07/22  Team Members Present: Physician leading conference: Other (comment) (Dr Durel Salts, DO) Social Worker Present: Loralee Pacas, [place] Nurse Present: Other (comment) Tacy Learn, RN) PT Present: Tereasa Coop, PT OT Present: Cherylynn Ridges, OT SLP Present: Weston Anna, SLP PPS Coordinator present : Gunnar Fusi, SLP     Current Status/Progress Goal Weekly Team Focus  Bowel/Bladder   Pt is continent/incontinent of bowel/bladder  Pt will gain continence of bowel/bladder  Will assess qshift and PRN   Swallow/Nutrition/ Hydration   Regular textues with thin liquids, intermittent supervision-Min A  Supervision  tolerance of current diet with use of swallowing compensatory strategies   ADL's   Max-Total assist for bathing/dressing. SBA: grooming, Mod A sqt pvt transfer no AD. Decreased left side attention, left visual deficits (HH), decreased attention to task/task inititation/task sequencing.  Min overall  ADL re-training, transfers, patient/family education, left side awareness   Mobility   modA bed mobility, modA sit to stand, modA 30' with R handrail and +2 WC follow  minA  L hemibody NMR, balance, transfers, ambulation   Communication             Safety/Cognition/ Behavioral Observations  Mod A  Supervision  sustained attention, visual scanning/attention to left, functional problem solving   Pain   Pt has no complaints of pain  Pt will remain pain  free  Will assess qshift and PRN   Skin   Pt has incisions to the head and abdomen closed with staples  Pt's incisions are healing  Will assess qshift and PRN     Discharge Planning:  D/c to home with his wife. Pt will need to be intermittent supervision since his wife works during the day. Support from their older children when she is at work.   Team Discussion: Debility. Incontinent B/B. Pain managed with PRNs.Tylenol,tape, and sling for affected side.  Incision to head and ABD with staples. No drainage reported. C-Diff precautions. Inattention to affected side. Patient on target to meet rehab goals: yes, patient ambulating 30 feet +2 for w/c follow   *See Care Plan and progress notes for long and short-term goals.   Revisions to Treatment Plan:  Monitor labs, CBGs  Teaching Needs: Medications, safety, skin/wound care, gait/transfer training, etc.  Current Barriers to Discharge: Decreased caregiver support, Home enviroment access/layout, Incontinence, Wound care, and Insurance for SNF coverage  Possible Resolutions to Barriers: Family education, medication educations, skin/wound care education, order recommended DME     Medical Summary    Barriers to Discharge: Behavior;Decreased family/caregiver support;Home enviroment access/layout;Incontinence;Medication compliance;Medical stability;Wound care;Insurance for SNF coverage  Barriers to Discharge Comments: L inattention, pain, incontinence, L hand contractures Possible Resolutions to [employer] Focus: Support/positioning/bracing of LUE, pain regimen changing, finishing C diff Tx   Continued Need for Acute Rehabilitation Level of Care: The patient requires daily medical management by a physician with specialized training in physical medicine and rehabilitation for the following reasons:  Direction of a multidisciplinary physical rehabilitation program to maximize functional independence : Yes Medical management of patient  stability for increased activity during participation in an intensive rehabilitation regime.: Yes Analysis of laboratory values and/or radiology reports with any subsequent need for medication adjustment and/or medical intervention. : Yes   I attest that I was present, lead the team conference, and concur with the assessment and plan of the team.   Jearld Adjutant 02/17/2022, 1:38 PM

## 2022-02-17 NOTE — Progress Notes (Signed)
PROGRESS NOTE   Subjective/Complaints:  No acute events overnight. No complaints this AM. OT does note significant pain when ranging LUE.   Patient stating staples can come out at 10 days post-op, per his discussions with neurosurgery.   ROS: Patient denies fever, rash, sore throat, blurred vision, dizziness, nausea, vomiting, diarrhea, cough, shortness of breath or chest pain, joint or back/neck pain,   or mood change.    Objective:   No results found. Recent Labs    02/15/22 0930  WBC 7.5  HGB 9.8*  HCT 29.1*  PLT 209    Recent Labs    02/15/22 0930  NA 141  K 3.0*  CL 105  CO2 26  GLUCOSE 193*  BUN <5*  CREATININE 0.42*  CALCIUM 9.1     Intake/Output Summary (Last 24 hours) at 02/17/2022 [id number] Last data filed at 02/17/2022 [id number] Gross per 24 hour  Intake 387 ml  Output --  Net 387 ml         Physical Exam: Vital Signs Blood pressure (!) 127/97, pulse 70, temperature 98.6 F (37 C), resp. rate 18, height 6\' 1"  (1.854 m), weight 81 kg, SpO2 100 %.  General: Alert and oriented x 3, No apparent distress HEENT: Large cranioplasty w/ associated swelling present on right; mild.  EOMI, sclera anicteric, oral mucosa pink and moist, poor dentition  Neck: Supple without JVD or lymphadenopathy Heart: Reg rate and rhythm. No murmurs rubs or gallops Chest: CTA bilaterally without wheezes, rales, or rhonchi; no distress Abdomen: Soft, non-tender, non-distended, bowel sounds positive. Extremities: No clubbing, cyanosis, or edema. Pulses are 2+ Psych: Pt's affect is appropriate. Pt is cooperative Skin: Crani incision and abdominal incisions c/d/I, with staples present.   Neuro:  pt is alert and orientedx3. Mild difficulty processing. Left central 7, mild dysarthria. L neglect.  Musculoskeletal: LUE flaccid with atrophy along finger flexors and shoulder girdle muscles, shoulder subluxation 1.5 fingerbreadths and TTP  from shoulder to elbow.   From prior exams, not repeated today: LLE 1+ to 2/5 quads,HF; hamstrings and HAD 1/5; 0/5 ADF/PF.  Mild finger flexor contractures left hand. LLE easily flexed to neutral. DTR's 2-3+ left side    Assessment/Plan: 1. Functional deficits which require 3+ hours per day of interdisciplinary therapy in a comprehensive inpatient rehab setting. Physiatrist is providing close team supervision and 24 hour management of active medical problems listed below. Physiatrist and rehab team continue to assess barriers to discharge/monitor patient progress toward functional and medical goals  Care Tool:  Bathing        Body parts bathed by helper: Right arm, Left arm, Chest, Abdomen, Front perineal area, Buttocks, Left lower leg, Right lower leg, Left upper leg, Right upper leg, Face     Bathing assist Assist Level: Dependent - Patient 0%     Upper Body Dressing/Undressing Upper body dressing   What is the patient wearing?: Pull over shirt    Upper body assist Assist Level: Maximal Assistance - Patient 25 - 49%    Lower Body Dressing/Undressing Lower body dressing      What is the patient wearing?: Pants, Incontinence brief     Lower body assist Assist for  lower body dressing: Total Assistance - Patient < 25%     Toileting Toileting Toileting Activity did not occur Landscape architect and hygiene only): N/A (no void or bm)  Toileting assist Assist for toileting: 2 Helpers     Transfers Chair/bed transfer  Transfers assist     Chair/bed transfer assist level: Moderate Assistance - Patient 50 - 74%     Locomotion Ambulation   Ambulation assist      Assist level: Maximal Assistance - Patient 25 - 49% Assistive device: Walker-rolling Max distance: 2 feet   Walk 10 feet activity   Assist  Walk 10 feet activity did not occur: Safety/medical concerns        Walk 50 feet activity   Assist Walk 50 feet with 2 turns activity did not occur:  Safety/medical concerns         Walk 150 feet activity   Assist Walk 150 feet activity did not occur: Safety/medical concerns         Walk 10 feet on uneven surface  activity   Assist Walk 10 feet on uneven surfaces activity did not occur: Safety/medical concerns         Wheelchair     Assist Is the patient using a wheelchair?: Yes Type of Wheelchair: Manual    Wheelchair assist level: Supervision/Verbal cueing Max wheelchair distance: 106 ft    Wheelchair 50 feet with 2 turns activity    Assist        Assist Level: Supervision/Verbal cueing   Wheelchair 150 feet activity     Assist      Assist Level: Moderate Assistance - Patient 50 - 74%   Blood pressure (!) 127/97, pulse 70, temperature 98.6 F (37 C), resp. rate 18, height 6\' 1"  (1.854 m), weight 81 kg, SpO2 100 %.  Medical Problem List and Plan: 1. Functional deficits secondary to debility after cranioplasty and C-Diff in setting of prior right MCA stroke, left hemiplegia             -patient may not yet shower             -ELOS/Goals: 12-14 days, mod assist with PT, OT, SLP             -pt has a resting WHO for LUE             -will go ahead and order PRAFO to maintain heel cord stretch  -Patient is beginning CIR therapies today including PT, OT, and SLP  2.  Antithrombotics: -DVT/anticoagulation:  Pharmaceutical: Eliquis             -antiplatelet therapy: none 3. Pain Management: Tylenol, oxycodone, hydrocodone, Robaxin as needed - no PRNs used, however still significant pain from shoulder subluxation.              - Will schedule Tylenol 1000 mg TID for pain management              - Change pain regimen to oxycodone 2.5/5 mg PRN for moderate/severe pain; DC hydrocodone              - DC robaxin              - For L shoulder sublux: Order OT taping and ROM, sling when OOB and not mobilizing shoulder, pillow support in bed  4. Mood/Behavior/Sleep: LCSW to evaluate and provide  emotional support             -antipsychotic agents: n/a             -  depression: continue Lexapro 20 mg daily             -insomnia: continue melatonin 5. Neuropsych/cognition: This patient is capable of making decisions on his own behalf. 6. Skin/Wound Care: routine skin care checks             -monitor scalp and abdominal surgical incisions - Surgery 9/18, staple removal 9/29 per patient             -scalp remains clean and intact currently 7. Fluids/Electrolytes/Nutrition:               -carb modified diet  -can dc IVF  9/24-Potassium 3.0--add supplement - repeat 9/27    -BUN/Cr wnl    -encourage PO    Latest Ref Rng & Units 02/15/2022    9:30 AM 02/02/2022   10:00 AM 10/08/2021    3:18 AM  BMP  Glucose 70 - 99 mg/dL 193  243  111   BUN 6 - 20 mg/dL 5  14  20    Creatinine 0.61 - 1.24 mg/dL 0.42  0.85  0.88   Sodium 135 - 145 mmol/L 141  141  140   Potassium 3.5 - 5.1 mmol/L 3.0  3.7  3.7   Chloride 98 - 111 mmol/L 105  109  104   CO2 22 - 32 mmol/L 26  27  25    Calcium 8.9 - 10.3 mg/dL 9.1  9.9  9.1      8: DM: Hgb A1c = 8.1; on SSI             -consider restarting home Lantus and Novolog  Recent Labs    02/16/22 1706 02/16/22 2054 02/17/22 0618  GLUCAP 148* 203* 171*    -9/24 fair control. Hold scheduled insulin for now            - 9/26 - rare PM highs >200 ~2100; otherwise well controlled - monitor  9: Hypertension: monitor             -continue clonidine patch 0.3 mg weekly             -continue amlodipine 10 mg daily             -continue Lopressor 25 mg BID             -continue lisinopril 40 mg daily             - BP well controlled ~130s; monitor    02/17/2022    5:53 AM 02/16/2022    7:47 PM 02/16/2022    2:05 PM  Vitals with BMI  Systolic [id number]  Diastolic 97 78 83  Pulse 70 80 93     10: C. diff diarrhea: continue oral vanc, end 9/30; discontinue Colace for now             -:BM 9/25, large, mushy 1x    LOS: 3 days A FACE TO FACE  EVALUATION WAS PERFORMED  Gertie Gowda 02/17/2022, 9:38 AM

## 2022-02-18 LAB — GLUCOSE, CAPILLARY
Glucose-Capillary: 163 mg/dL — ABNORMAL HIGH (ref 70–99)
Glucose-Capillary: 174 mg/dL — ABNORMAL HIGH (ref 70–99)
Glucose-Capillary: 135 mg/dL — ABNORMAL HIGH (ref 70–99)
Glucose-Capillary: 239 mg/dL — ABNORMAL HIGH (ref 70–99)

## 2022-02-18 LAB — BASIC METABOLIC PANEL
Anion gap: 7 (ref 5–15)
BUN: 6 mg/dL (ref 6–20)
CO2: 32 mmol/L (ref 22–32)
Calcium: 9.4 mg/dL (ref 8.9–10.3)
Chloride: 103 mmol/L (ref 98–111)
Creatinine, Ser: 0.49 mg/dL — ABNORMAL LOW (ref 0.61–1.24)
GFR, Estimated: >60 mL/min (ref >60)
Glucose, Bld: 174 mg/dL — ABNORMAL HIGH (ref 70–99)
Potassium: 3.2 mmol/L — ABNORMAL LOW (ref 3.5–5.1)
Sodium: 142 mmol/L (ref 135–145)

## 2022-02-18 MED ORDER — POTASSIUM CHLORIDE CRYS ER 20 MEQ PO TBCR
20.0000 meq | EXTENDED_RELEASE_TABLET | Freq: Two times a day (BID) | ORAL | Status: DC
  Administered 2022-02-18 – 2022-03-07 (×34): 20 meq via ORAL
  Filled 2022-02-18 (×34): qty 1

## 2022-02-18 NOTE — Progress Notes (Signed)
PROGRESS NOTE   Subjective/Complaints:  No acute events overnight. No complaints this AM. BM yesterday, well formed, none today per patient.  Verbose, making jokes about his surgical staples looking like a "football head".   ROS: Patient denies fever, rash, sore throat, blurred vision, dizziness, nausea, vomiting, diarrhea, cough, shortness of breath or chest pain, joint or back/neck pain,   or mood change.    Objective:   No results found. No results for input(s): "WBC", "HGB", "HCT", "PLT" in the last 72 hours.  Recent Labs    02/18/22 0735  NA 142  K 3.2*  CL 103  CO2 32  GLUCOSE 174*  BUN 6  CREATININE 0.49*  CALCIUM 9.4     Intake/Output Summary (Last 24 hours) at 02/18/2022 1001 Last data filed at 02/18/2022 0829 Gross per 24 hour  Intake 720 ml  Output --  Net 720 ml         Physical Exam: Vital Signs Blood pressure (!) 154/90, pulse 61, temperature 98 F (36.7 C), resp. rate 18, height 6\' 1"  (1.854 m), weight 81 kg, SpO2 100 %.  General: Alert and oriented x 3, No apparent distress HEENT: Large cranioplasty w/ minimal swelling present on right; mild.  EOMI, sclera anicteric, oral mucosa pink and moist, poor dentition  Neck: Supple without JVD or lymphadenopathy Heart: Reg rate and rhythm. No murmurs rubs or gallops Chest: CTA bilaterally without wheezes, rales, or rhonchi; no distress Abdomen: Soft, non-tender, non-distended, bowel sounds positive. Extremities: No clubbing, cyanosis, or edema.  Psych: Pt's affect is appropriate. Pt is cooperative Skin: Crani incision and abdominal incisions c/d/I, with staples present.   Neuro:  pt is alert and orientedx3. Mild difficulty processing. Left central 7, mild dysarthria. L neglect, ongoing.   Musculoskeletal: LUE flaccid, moving RUE antigravity in bed.   From prior exams, not repeated today: LLE 1+ to 2/5 quads,HF; hamstrings and HAD 1/5; 0/5 ADF/PF.   Mild finger flexor contractures left hand. LLE easily flexed to neutral. DTR's 2-3+ left side Musculoskeletal: LUE flaccid with atrophy along finger flexors and shoulder girdle muscles, shoulder subluxation 1.5 fingerbreadths and TTP from shoulder to elbow.    Assessment/Plan: 1. Functional deficits which require 3+ hours per day of interdisciplinary therapy in a comprehensive inpatient rehab setting. Physiatrist is providing close team supervision and 24 hour management of active medical problems listed below. Physiatrist and rehab team continue to assess barriers to discharge/monitor patient progress toward functional and medical goals  Care Tool:  Bathing        Body parts bathed by helper: Right arm, Left arm, Chest, Abdomen, Front perineal area, Buttocks, Left lower leg, Right lower leg, Left upper leg, Right upper leg, Face     Bathing assist Assist Level: Dependent - Patient 0%     Upper Body Dressing/Undressing Upper body dressing   What is the patient wearing?: Pull over shirt    Upper body assist Assist Level: Maximal Assistance - Patient 25 - 49%    Lower Body Dressing/Undressing Lower body dressing      What is the patient wearing?: Pants, Incontinence brief     Lower body assist Assist for lower body dressing: Total  Assistance - Patient < 25%     Toileting Toileting Toileting Activity did not occur Landscape architect and hygiene only): N/A (no void or bm)  Toileting assist Assist for toileting: 2 Helpers     Transfers Chair/bed transfer  Transfers assist     Chair/bed transfer assist level: Moderate Assistance - Patient 50 - 74%     Locomotion Ambulation   Ambulation assist      Assist level: Maximal Assistance - Patient 25 - 49% Assistive device: Walker-rolling Max distance: 2 feet   Walk 10 feet activity   Assist  Walk 10 feet activity did not occur: Safety/medical concerns        Walk 50 feet activity   Assist Walk 50 feet  with 2 turns activity did not occur: Safety/medical concerns         Walk 150 feet activity   Assist Walk 150 feet activity did not occur: Safety/medical concerns         Walk 10 feet on uneven surface  activity   Assist Walk 10 feet on uneven surfaces activity did not occur: Safety/medical concerns         Wheelchair     Assist Is the patient using a wheelchair?: Yes Type of Wheelchair: Manual    Wheelchair assist level: Supervision/Verbal cueing Max wheelchair distance: 106 ft    Wheelchair 50 feet with 2 turns activity    Assist        Assist Level: Supervision/Verbal cueing   Wheelchair 150 feet activity     Assist      Assist Level: Moderate Assistance - Patient 50 - 74%   Blood pressure (!) 154/90, pulse 61, temperature 98 F (36.7 C), resp. rate 18, height 6\' 1"  (1.854 m), weight 81 kg, SpO2 100 %.  Medical Problem List and Plan: 1. Functional deficits secondary to debility after cranioplasty and C-Diff in setting of prior right MCA stroke, left hemiplegia             -patient may not yet shower             -ELOS/Goals: 12-14 days, mod assist with PT, OT, SLP             -pt has a resting WHO for LUE             -will go ahead and order PRAFO to maintain heel cord stretch  -Patient is beginning CIR therapies today including PT, OT, and SLP  2.  Antithrombotics: -DVT/anticoagulation:  Pharmaceutical: Eliquis             -antiplatelet therapy: none 3. Pain Management: Tylenol, oxycodone, hydrocodone, Robaxin as needed - significant pain from shoulder subluxation.              - 9/27 schedule Tylenol 1000 mg TID for pain management + Change pain regimen to oxycodone 2.5/5 mg PRN for moderate/severe pain; DC hydrocodone, DC robaxin              - For L shoulder sublux: Order OT taping and ROM, sling when OOB and not mobilizing shoulder, pillow support in bed 9/27            4. Mood/Behavior/Sleep: LCSW to evaluate and provide emotional  support             -antipsychotic agents: n/a             -depression: continue Lexapro 20 mg daily             -  insomnia: continue melatonin 5. Neuropsych/cognition: This patient is capable of making decisions on his own behalf. 6. Skin/Wound Care: routine skin care checks             -monitor scalp and abdominal surgical incisions - Surgery 9/18, staple removal 9/29 per patient             -scalp remains clean and intact currently 7. Fluids/Electrolytes/Nutrition:               -carb modified diet  -can dc IVF  9/24-Potassium 3.0--add supplement - 3.2 9/27-- inc supplement to 20 meq BID  - BMP 9/30    -BUN/Cr wnl    -encourage PO    Latest Ref Rng & Units 02/18/2022    7:35 AM 02/15/2022    9:30 AM 02/02/2022   10:00 AM  BMP  Glucose 70 - 99 mg/dL 174  193  243   BUN 6 - 20 mg/dL 6  <5  14   Creatinine 0.61 - 1.24 mg/dL 0.49  0.42  0.85   Sodium 135 - 145 mmol/L 142  141  141   Potassium 3.5 - 5.1 mmol/L 3.2  3.0  3.7   Chloride 98 - 111 mmol/L 103  105  109   CO2 22 - 32 mmol/L 32  26  27   Calcium 8.9 - 10.3 mg/dL 9.4  9.1  9.9      8: DM: Hgb A1c = 8.1; on SSI             -consider restarting home Lantus and Novolog  Recent Labs    02/17/22 1612 02/17/22 2113 02/18/22 0623  GLUCAP 149* 149* 163*     -9/24 fair control. Hold scheduled insulin for now            - 9/26 - rare PM highs >200 ~2100; otherwise well controlled - monitor           - 9/27 - well controlled  9: Hypertension: monitor             -continue clonidine patch 0.3 mg weekly             -continue amlodipine 10 mg daily             -continue Lopressor 25 mg BID             -continue lisinopril 40 mg daily             - BP well controlled ~130s; monitor    02/18/2022    4:41 AM 02/17/2022    8:17 PM 02/17/2022    1:05 PM  Vitals with BMI  Systolic [id number]  Diastolic 90 85 86  Pulse 61 77 72     10: C. diff diarrhea: continue oral vanc, end 9/30; discontinue Colace for now             -:BM  9/26     LOS: 4 days A FACE TO FACE EVALUATION WAS PERFORMED  Gertie Gowda 02/18/2022, 10:01 AM

## 2022-02-18 NOTE — Progress Notes (Signed)
Occupational Therapy Session Note  Patient Details  Name: Jacob [name] MRN: [id number] Date of Birth: 02-13-1976  Today's Date: 02/18/2022 OT Individual Time: 0950-1100 OT Individual Time Calculation (min): 70 min    Short Term Goals: Week 1:  OT Short Term Goal 1 (Week 1): Patient will don pull over shirt with no more than mod assist OT Short Term Goal 2 (Week 1): Patient will bathe face, chest, abdomen, upper thighs and below knees following set up and cueing with min assist OT Short Term Goal 3 (Week 1): Patient will transfer to commode with min assist OT Short Term Goal 4 (Week 1): Patient will complete oral care with min assist OT Short Term Goal 5 (Week 1): Patient will tolerate passive stretch to LUE to 90* of shoulder flexion, and at least 20* of external rotation without significant increase in report of pain.  Skilled Therapeutic Interventions/Progress Updates:   Pt greeted supine in bed in supported position pt placed in by previous PT session. OT placed positioning guide up on bulletin board over patients bed. Pt agreeable to get up. Mod A for bed mobility to get to sitting EOB. Worked on dynamic sitting balance with dressing tasks at EOB (pt declined bathing). Pt with good sitting balance and able to reach forward to help thread pants with only CGA for balance. Pt unable to lift L LE functionally to assist with threading L side. UB dressing with pt needing mod A to get shirt overhead and protect staples and thread L UE through long sleeves.  Sit<>stand at EOB with mod A, then mod A to pivot to wc. Pt brought to the sink for hand washing. OT assist to bring L UE to the sink for bimanual hand washing task. Pt brought to therapy gym and OT placed Kinesiotape to support L shoulder sublux. L UE ROM and NMR with weight bearing towel pushes along table. Focus on pushing and pulling. Gentle ROM and stretching to L hand to increase finger extension. Pt requested to return to room to change  shirts. OT assist to get shirt off to protect head staples. Blocked practice for donning new shirt and threading L side first. Pt requested to return to recliner and completed stand-pivot with mod A. OT placed L UE in sling for shoulder protection. Pt left seated in recliner with alarm belt on, call bell in reach, and needs met.   Therapy Documentation Precautions:  Precautions Precautions: Fall Precaution Comments: L hemiplegia; painful LUE/shoulder; Required Braces or Orthoses: Splint/Cast Splint/Cast: WHO - Resting hand splint at night Restrictions Weight Bearing Restrictions: No Pain: Pain Assessment Pain Scale: 0-10 Pain Score: 0-No pain    Therapy/Group: Individual Therapy  Valma Cava 02/18/2022, 11:33 AM

## 2022-02-18 NOTE — Progress Notes (Signed)
Physical Therapy Session Note  Patient Details  Name: Jacob [name] MRN: [id number] Date of Birth: 08-Sep-1975  Today's Date: 02/18/2022 PT Individual Time: 0902-0930 PT Individual Time Calculation (min): 28 min   Short Term Goals: Week 1:  PT Short Term Goal 1 (Week 1): Patient will perform bed mobility with min A consistently. PT Short Term Goal 2 (Week 1): Patient will perform basic transfers with min A >50% of the time. PT Short Term Goal 3 (Week 1): Patient will ambulate >100 ft using LRAD with mod A.  Skilled Therapeutic Interventions/Progress Updates:     Patient in bed upon PT arrival. Patient alert and agreeable to PT session. Patient denied pain during session. Patient reported increased fatigue this morning and asked to stay in bed "for a little longer." Patient noted to have legs together and windswept to the L with L lower extremity in hip/knee flexion and ER.   Neuromuscular Re-ed: Patient performed the following interventions for reduced tone/flexor synergy on L lower extremity: -progressive L hip, knee, ankle ROM through full flexion/extension, abd/add, IR/ER, and DF/PF with use of prolonged stretch, over pressure, manual vibration, and inhibitory pressure over spastic muscles  Educated patient on importance of positioning and ROM for improved functional use of his L hemi-body. Prior to NMR, see above, donned PRAFO with poor resistance against PF tone and increased hip ER beyond the resistance from the kick-stand. Follower interventions, placed PRAFO with improved positioning to neutral hip rotation and DF to neutral. Educated on increasing wearing schedule of PRAFO for reduced L lower extremity tone and improved function. Also educated on positioning of L upper extremity on a pillow in shoulder abd/ER and elbow extension. Patient stated understanding and agreeable to maintain positioning while in the bed. Sign placed over patient's bed to demonstrate L hemi-body positioning in  the bed for staff.   Patient in bed, positioned as above, at end of session with breaks locked, bed alarm set, and all needs within reach.   Therapy Documentation Precautions:  Precautions Precautions: Fall Precaution Comments: L hemiplegia; painful LUE/shoulder; Required Braces or Orthoses: Splint/Cast Splint/Cast: WHO - Resting hand splint at night Restrictions Weight Bearing Restrictions: No    Therapy/Group: Individual Therapy  [name] L Jennesis Ramaswamy PT, DPT, NCS, CBIS  02/18/2022, 12:56 PM

## 2022-02-18 NOTE — Progress Notes (Signed)
Physical Therapy Session Note  Patient Details  Name: Jacob [name] MRN: [id number] Date of Birth: 1975/06/04  Today's Date: 02/18/2022 PT Individual Time: 8295-6213 PT Individual Time Calculation (min): 27 min   Short Term Goals: Week 1:  PT Short Term Goal 1 (Week 1): Patient will perform bed mobility with min A consistently. PT Short Term Goal 2 (Week 1): Patient will perform basic transfers with min A >50% of the time. PT Short Term Goal 3 (Week 1): Patient will ambulate >100 ft using LRAD with mod A.  Skilled Therapeutic Interventions/Progress Updates:     Pt received supine in bed and agrees to therapy. No complaint of pain. Supine to sit with bed features and cues for positioning at EOB. Dependent assist to don shoe seated at EOB for time management. Pt performs stand pivot transfer to [facility] with modA and cues for sequencing and hand placement. WC transport to gym. Pt ambulates 2x30' with R handrail in hallway, with modA and +2 for WC follow. PT provides facilitation of lateral weight shifting, blocking of L knee during stance phase, facilitation of L hip extension and glute engagement during stance phase, and 50% assist to help L leg progress during swing phase to promote functional step length. Pt has fewer instances of L knee buckling than previous session with this therapist. WC transport back to room. Stand pivot to recliner with modA and same cues. Left seated with all needs within reach.  Therapy Documentation Precautions:  Precautions Precautions: Fall Precaution Comments: L hemiplegia; painful LUE/shoulder; Required Braces or Orthoses: Splint/Cast Splint/Cast: WHO - Resting hand splint at night Restrictions Weight Bearing Restrictions: No    Therapy/Group: Individual Therapy  Breck Coons, PT, DPT 02/18/2022, 5:13 PM

## 2022-02-18 NOTE — Progress Notes (Signed)
Speech Language Pathology Daily Session Note  Patient Details  Name: Jacob [name] MRN: [id number] Date of Birth: [birth year]  Today's Date: 02/18/2022 SLP Individual Time: 1115-1220 SLP Individual Time Calculation (min): 65 min  Short Term Goals: Week 1: SLP Short Term Goal 1 (Week 1): Pt will participate in functional and therapeutic recall activities targeting short-term and working memory with 80% accuracy given Mod A multimodal cues. SLP Short Term Goal 2 (Week 1): Pt will attend to functional and therapeutic tasks for 15 minute intervals with no verbal cues for redirection and/or to maintain alertness. SLP Short Term Goal 3 (Week 1): Pt will complete basic problem-solving tasks with 80% accuracy given Mod A vebral and visual cues. SLP Short Term Goal 4 (Week 1): Pt will utilize visual scanning strategies to participate in functional vision tasks to include reading with 100% accuracy given Mod A verbal and visual prompts. SLP Short Term Goal 5 (Week 1): Pt will tolerate current diet of regular textures and thin liquids with no clinical s/sx concerning for airway invasion given Min A verbal cues for adherence to aspiration precautions.  Skilled Therapeutic Interventions:Skilled ST services focused on cognitive skills. SLP facilitated left scanning and sustained attention in cancellation task requiring min A fade to supervision A verbal cues. Pt demonstrated ability to follow 1 step verbal directions with supervision A verbal cues due to preservation/visual deficits. Pt demonstrated increased difficulty sequencing common task (ex: making a peanut butter and jelly sandwich) with written information due to visual deficits, however when rewritten in large print/simplified pt was able to sequence with min A verbal cues. Pt 's problem solving deficits where further impacted by vision and short term recall deficits. Pt demonstrated sustained attention in 15 minutes intervals with supervision A verbal  cues for redirection.  Pt was left in room with call bell within reach and chair alarm set. SLP recommends to continue skilled services.     Pain Pain Assessment Pain Scale: 0-10 Pain Score: 0-No pain  Therapy/Group: Individual Therapy  Kaiyla Stahly  [facility] 02/18/2022, 12:35 PM

## 2022-02-19 LAB — GLUCOSE, CAPILLARY
Glucose-Capillary: 152 mg/dL — ABNORMAL HIGH (ref 70–99)
Glucose-Capillary: 214 mg/dL — ABNORMAL HIGH (ref 70–99)
Glucose-Capillary: 150 mg/dL — ABNORMAL HIGH (ref 70–99)
Glucose-Capillary: 139 mg/dL — ABNORMAL HIGH (ref 70–99)

## 2022-02-19 NOTE — Progress Notes (Signed)
PROGRESS NOTE   Subjective/Complaints:  No acute events overnight. No complaints this AM. BM yesterday, well formed >48 hours.  States shoulder pain much improved with taping and use of sling when OOB for positioning.   ROS: Patient denies fever, rash, sore throat, blurred vision, dizziness, nausea, vomiting, diarrhea, cough, shortness of breath or chest pain, joint or back/neck pain,   or mood change.    Objective:   No results found. No results for input(s): "WBC", "HGB", "HCT", "PLT" in the last 72 hours.  Recent Labs    02/18/22 0735  NA 142  K 3.2*  CL 103  CO2 32  GLUCOSE 174*  BUN 6  CREATININE 0.49*  CALCIUM 9.4     Intake/Output Summary (Last 24 hours) at 02/19/2022 1013 Last data filed at 02/18/2022 1756 Gross per 24 hour  Intake 720 ml  Output --  Net 720 ml         Physical Exam: Vital Signs Blood pressure (!) 137/96, pulse 62, temperature 97.9 F (36.6 C), resp. rate 16, height 6\' 1"  (1.854 m), weight 81 kg, SpO2 100 %.  General: Alert and oriented x 3, No apparent distress HEENT: Large cranioplasty w/ minimal swelling present on right; mild.  EOMI, sclera anicteric, oral mucosa pink and moist, poor dentition  Neck: Supple without JVD or lymphadenopathy Heart: Reg rate and rhythm. No murmurs rubs or gallops Chest: CTA bilaterally without wheezes, rales, or rhonchi; no distress Abdomen: Soft, non-tender, non-distended, bowel sounds positive. Extremities: No clubbing, cyanosis, or edema.  Psych: Pt's affect is appropriate. Pt is cooperative Skin: Crani incision and abdominal incisions c/d/I, with staples present.   Neuro:  pt is alert and orientedx3. Mild difficulty processing. Left central 7, mild dysarthria. L neglect, ongoing.   Musculoskeletal: LUE flaccid, +kinestape moving RUE antigravity in bed.   From prior exams, not repeated today: LLE 1+ to 2/5 quads,HF; hamstrings and HAD 1/5; 0/5  ADF/PF.  Mild finger flexor contractures left hand. LLE easily flexed to neutral. DTR's 2-3+ left side Musculoskeletal: LUE flaccid with atrophy along finger flexors and shoulder girdle muscles, shoulder subluxation 1.5 fingerbreadths and TTP from shoulder to elbow.    Assessment/Plan: 1. Functional deficits which require 3+ hours per day of interdisciplinary therapy in a comprehensive inpatient rehab setting. Physiatrist is providing close team supervision and 24 hour management of active medical problems listed below. Physiatrist and rehab team continue to assess barriers to discharge/monitor patient progress toward functional and medical goals  Care Tool:  Bathing        Body parts bathed by helper: Right arm, Left arm, Chest, Abdomen, Front perineal area, Buttocks, Left lower leg, Right lower leg, Left upper leg, Right upper leg, Face     Bathing assist Assist Level: Dependent - Patient 0%     Upper Body Dressing/Undressing Upper body dressing   What is the patient wearing?: Pull over shirt    Upper body assist Assist Level: Maximal Assistance - Patient 25 - 49%    Lower Body Dressing/Undressing Lower body dressing      What is the patient wearing?: Pants, Incontinence brief     Lower body assist Assist for lower body  dressing: Total Assistance - Patient < 25%     Toileting Toileting Toileting Activity did not occur Press photographer and hygiene only): N/A (no void or bm)  Toileting assist Assist for toileting: 2 Helpers     Transfers Chair/bed transfer  Transfers assist     Chair/bed transfer assist level: Moderate Assistance - Patient 50 - 74%     Locomotion Ambulation   Ambulation assist      Assist level: Maximal Assistance - Patient 25 - 49% Assistive device: Walker-rolling Max distance: 2 feet   Walk 10 feet activity   Assist  Walk 10 feet activity did not occur: Safety/medical concerns        Walk 50 feet activity   Assist Walk  50 feet with 2 turns activity did not occur: Safety/medical concerns         Walk 150 feet activity   Assist Walk 150 feet activity did not occur: Safety/medical concerns         Walk 10 feet on uneven surface  activity   Assist Walk 10 feet on uneven surfaces activity did not occur: Safety/medical concerns         Wheelchair     Assist Is the patient using a wheelchair?: Yes Type of Wheelchair: Manual    Wheelchair assist level: Supervision/Verbal cueing Max wheelchair distance: 106 ft    Wheelchair 50 feet with 2 turns activity    Assist        Assist Level: Supervision/Verbal cueing   Wheelchair 150 feet activity     Assist      Assist Level: Moderate Assistance - Patient 50 - 74%   Blood pressure (!) 137/96, pulse 62, temperature 97.9 F (36.6 C), resp. rate 16, height 6\' 1"  (1.854 m), weight 81 kg, SpO2 100 %.  Medical Problem List and Plan: 1. Functional deficits secondary to debility after cranioplasty and C-Diff in setting of prior right MCA stroke, left hemiplegia             -patient may not yet shower             -ELOS/Goals: 12-14 days, mod assist with PT, OT, SLP             -pt has a resting WHO for LUE             -will go ahead and order PRAFO to maintain heel cord stretch  -Patient is beginning CIR therapies today including PT, OT, and SLP  2.  Antithrombotics: -DVT/anticoagulation:  Pharmaceutical: Eliquis             -antiplatelet therapy: none 3. Pain Management: Tylenol, oxycodone, hydrocodone, Robaxin as needed - significant pain from shoulder subluxation.              - 9/27 schedule Tylenol 1000 mg TID for pain management + Change pain regimen to oxycodone 2.5/5 mg PRN for moderate/severe pain; DC hydrocodone, DC robaxin              - For L shoulder sublux: Order OT taping and ROM, sling when OOB and not mobilizing shoulder, pillow support in bed 9/27            4. Mood/Behavior/Sleep: LCSW to evaluate and provide  emotional support             -antipsychotic agents: n/a             -depression: continue Lexapro 20 mg daily             -  insomnia: continue melatonin 5. Neuropsych/cognition: This patient is capable of making decisions on his own behalf. 6. Skin/Wound Care: routine skin care checks             -monitor scalp and abdominal surgical incisions - Surgery 9/18, staple removal 9/29; pt requests lidocaine gel for removal             -scalp remains clean and intact currently 7. Fluids/Electrolytes/Nutrition:               -carb modified diet  -can dc IVF  9/24-Potassium 3.0--add supplement - 3.2 9/27-- inc supplement to 20 meq BID  - BMP 9/30    -BUN/Cr wnl    -encourage PO    Latest Ref Rng & Units 02/18/2022    7:35 AM 02/15/2022    9:30 AM 02/02/2022   10:00 AM  BMP  Glucose 70 - 99 mg/dL 614  431  540   BUN 6 - 20 mg/dL 6  <5  14   Creatinine 0.61 - 1.24 mg/dL 0.86  7.61  9.50   Sodium 135 - 145 mmol/L 142  141  141   Potassium 3.5 - 5.1 mmol/L 3.2  3.0  3.7   Chloride 98 - 111 mmol/L 103  105  109   CO2 22 - 32 mmol/L 32  26  27   Calcium 8.9 - 10.3 mg/dL 9.4  9.1  9.9      8: DM: Hgb A1c = 8.1; on SSI             -consider restarting home Lantus and Novolog    -9/24 fair control. Hold scheduled insulin for now            - 9/26 - rare PM highs >200 ~2100; otherwise well controlled - monitor  9: Hypertension: monitor             -continue clonidine patch 0.3 mg weekly             -continue amlodipine 10 mg daily             -continue Lopressor 25 mg BID             -continue lisinopril 40 mg daily             - BP well controlled ~130s; monitor    02/19/2022    5:20 AM 02/18/2022    7:32 PM 02/18/2022    1:32 PM  Vitals with BMI  Systolic 137 119 932  Diastolic 96 72 82  Pulse 62 72 75     10: C. diff diarrhea: continue oral vanc, end 9/30; discontinue Colace for now             -:BM 9/27, well formed             - Inquiring with infection control today about stopping  PO vanc and removing precautions    LOS: 5 days A FACE TO FACE EVALUATION WAS PERFORMED  Angelina Sheriff 02/19/2022, 10:13 AM

## 2022-02-19 NOTE — Progress Notes (Signed)
Physical Therapy Session Note  Patient Details  Name: Jacob [name] MRN: [id number] Date of Birth: 02/05/1976  Today's Date: 02/19/2022 PT Individual Time: 5188-4166 PT Individual Time Calculation (min): 71 min   Short Term Goals: Week 1:  PT Short Term Goal 1 (Week 1): Patient will perform bed mobility with min A consistently. PT Short Term Goal 2 (Week 1): Patient will perform basic transfers with min A >50% of the time. PT Short Term Goal 3 (Week 1): Patient will ambulate >100 ft using LRAD with mod A.  Skilled Therapeutic Interventions/Progress Updates:     Pt received seated in recliner and agrees to therapy. No complaint of pain. Pt performs sit to stand and stand pivot transfer to [facility] with modA and cues for hand placement, sequencing, and positioning. WC transport to gym for time management. Session emphasizes gait training to promote WB through L lower extremity, as well as challenging balance and strength. Pt performs multiple reps of sit to stand with modA and cues to utilize WC armrest to push-off, rather than pulling on handrail, to promote functional movement pattern. Cues also for anterior weight shift and initiation of transfer. Pt ambulates multiple bouts along wall utilizing R handrail. PT provides modA to facilitate trunk extension to promote upright posture and improve body mechanics, lateral weight shifting, blocking of L knee and facilitating L hip extension and glute engagement during stance phase. Pt is able to initiate swing phase with L lower extremity with cueing to "kick forward", and PT provide manual assistance to promote functional step length. Pt ambulates forward x30' and backward 5-10' to provide alternating stimulus and different motor pattern for L lower extremity. For backward ambulation PT facilitates L knee flexion, hip extension, and lateral weight shifting, as well as blocking of L knee. +2 WC follow throughout for safety. Pt completes x5 bouts with seated rest  breaks. WC transport back to room. Stand pivot transfer to bed with modA and same cueing. Sit to supine with modA management of L lower extremity as well as trunk. Left supine with alarm intact and all needs within reach.  2nd Session: Pt requesting to rest due to fatigue. PT will follow up as able.  Therapy Documentation Precautions:  Precautions Precautions: Fall Precaution Comments: L hemiplegia; painful LUE/shoulder; Required Braces or Orthoses: Splint/Cast Splint/Cast: WHO - Resting hand splint at night Restrictions Weight Bearing Restrictions: No    Therapy/Group: Individual Therapy  Breck Coons, PT, DPT 02/19/2022, 5:31 PM

## 2022-02-19 NOTE — Progress Notes (Signed)
Speech Language Pathology Daily Session Note  Patient Details  Name: Jacob [name] MRN: [id number] Date of Birth: [birth year]  Today's Date: 02/19/2022 SLP Individual Time: 1102-1129 SLP Individual Time Calculation (min): 27 min  Short Term Goals: Week 1: SLP Short Term Goal 1 (Week 1): Pt will participate in functional and therapeutic recall activities targeting short-term and working memory with 80% accuracy given Mod A multimodal cues. SLP Short Term Goal 2 (Week 1): Pt will attend to functional and therapeutic tasks for 15 minute intervals with no verbal cues for redirection and/or to maintain alertness. SLP Short Term Goal 3 (Week 1): Pt will complete basic problem-solving tasks with 80% accuracy given Mod A vebral and visual cues. SLP Short Term Goal 4 (Week 1): Pt will utilize visual scanning strategies to participate in functional vision tasks to include reading with 100% accuracy given Mod A verbal and visual prompts. SLP Short Term Goal 5 (Week 1): Pt will tolerate current diet of regular textures and thin liquids with no clinical s/sx concerning for airway invasion given Min A verbal cues for adherence to aspiration precautions.  Skilled Therapeutic Interventions: Skilled treatment session focused on cognitive goals. SLP facilitated session by providing overall min A verbal cues for functional problem solving and visual scanning/attention to left during a basic money management task. Patient demonstrated selective attention to task for ~15 minutes in a moderately distracting environment. Patient transferred to the recliner at end of session via the stedy +2 assist for safety with Mod verbal cues needed for safety as patient requesting to utilize a squat pivot instead. Patient left upright in recliner with alarm on and all needs within reach. Continue with current plan of care.      Pain No/Denies Pain  Therapy/Group: Individual Therapy  Kerianne Gurr 02/19/2022, 12:13 PM

## 2022-02-19 NOTE — Progress Notes (Signed)
Occupational Therapy Session Note  Patient Details  Name: Jacob [name] MRN: [id number] Date of Birth: August 30, 1975  Today's Date: 02/19/2022 OT Individual Time: 1000-1100 OT Individual Time Calculation (min): 60 min    Short Term Goals: Week 1:  OT Short Term Goal 1 (Week 1): Patient will don pull over shirt with no more than mod assist OT Short Term Goal 2 (Week 1): Patient will bathe face, chest, abdomen, upper thighs and below knees following set up and cueing with min assist OT Short Term Goal 3 (Week 1): Patient will transfer to commode with min assist OT Short Term Goal 4 (Week 1): Patient will complete oral care with min assist OT Short Term Goal 5 (Week 1): Patient will tolerate passive stretch to LUE to 90* of shoulder flexion, and at least 20* of external rotation without significant increase in report of pain.  Skilled Therapeutic Interventions/Progress Updates:    Pt greeted semi-reclined in bed asleep. Pt initially did not want to participate in therapy due to fatigue as he did not sleep well last night. OT gave patient a few more minutes, then returned. Pt agreeable to get up. Pt noted to be incontinent of urine and unaware. Worked on rolling in bed to cleanse peri-area and bottom with pt able to reach behind in sidelying, but needed max A to roll to the R. Pt then reported need to urinate more and had continent void in urinal. Pt completed bed mobility with bedrails and mod A. Max A to thread pants, then mod A to stand for OT to assist with pulling them up. Worked on sitting balance and UB dressing seated EOB with mod A for dressing due to dense L hemiplegia. OT assisted with donning sling prior to stand-pivot to wc with mod A to the R. Pt brought to therapy gym for L UE stretching and NMR using palm ball. OT A to roll ball and achieve increased PROM. Sit<>stands at high-low table with min/mod A. Weight bearing through L UE for NMR. Facilitated increased trunk and hip extension  reaching overhead with R UE in standing and holding for 10 seconds. Pt handoff to SLP in wc for next therapy session.   Therapy Documentation Precautions:  Precautions Precautions: Fall Precaution Comments: L hemiplegia; painful LUE/shoulder; Required Braces or Orthoses: Splint/Cast Splint/Cast: WHO - Resting hand splint at night Restrictions Weight Bearing Restrictions: No General: General OT Amount of Missed Time: 15 Minutes Pain:  Pt reports pain in L shoulder. Repositioned for comfort.   Therapy/Group: Individual Therapy  Valma Cava 02/19/2022, 12:43 PM

## 2022-02-19 NOTE — Progress Notes (Signed)
Patient ID: Jacob [name], male   DOB: [birth year], 46 y.o.   MRN: [id number]  SW spoke with  [facility] with [place] [employer]  to discuss [facility] referral [phone]). SW informed a message will be provided to appropriate staff and they will follow-up.   Loralee Pacas, MSW, [place] Office: [phone] Cell: [phone] Fax: [phone]

## 2022-02-20 LAB — GLUCOSE, CAPILLARY
Glucose-Capillary: 164 mg/dL — ABNORMAL HIGH (ref 70–99)
Glucose-Capillary: 224 mg/dL — ABNORMAL HIGH (ref 70–99)
Glucose-Capillary: 132 mg/dL — ABNORMAL HIGH (ref 70–99)
Glucose-Capillary: 237 mg/dL — ABNORMAL HIGH (ref 70–99)

## 2022-02-20 MED ORDER — ORAL CARE MOUTH RINSE: 15.0000 mL | OROMUCOSAL | Status: DC | PRN

## 2022-02-20 MED ORDER — LIDOCAINE HCL URETHRAL/MUCOSAL 2 % EX GEL
1.0000 | Freq: Once | CUTANEOUS | Status: AC
  Administered 2022-02-20: 1 via TOPICAL
  Filled 2022-02-20: qty 6

## 2022-02-20 MED ORDER — INSULIN GLARGINE-YFGN 100 UNIT/ML ~~LOC~~ SOLN
3.0000 [IU] | Freq: Every day | SUBCUTANEOUS | Status: DC
  Administered 2022-02-21 – 2022-02-23 (×3): 3 [IU] via SUBCUTANEOUS
  Filled 2022-02-20 (×3): qty 0.03

## 2022-02-20 NOTE — Progress Notes (Signed)
PROGRESS NOTE   Subjective/Complaints:  No acute events overnight. No complaints this AM. Did state wife had paperwork at bedside to discuss this morning (child support/state disability forms), discussed this over the phone with her and completed forms left in patient room.  ROS: Patient denies fever, rash, sore throat, blurred vision, dizziness, nausea, vomiting, diarrhea, cough, shortness of breath or chest pain, joint or back/neck pain,   or mood change.    Objective:   No results found. No results for input(s): "WBC", "HGB", "HCT", "PLT" in the last 72 hours.  Recent Labs    02/18/22 0735  NA 142  K 3.2*  CL 103  CO2 32  GLUCOSE 174*  BUN 6  CREATININE 0.49*  CALCIUM 9.4     Intake/Output Summary (Last 24 hours) at 02/20/2022 1354 Last data filed at 02/20/2022 1300 Gross per 24 hour  Intake 356 ml  Output --  Net 356 ml         Physical Exam: Vital Signs Blood pressure 111/77, pulse 66, temperature 99 F (37.2 C), resp. rate 18, height 6\' 1"  (1.854 m), weight 81 kg, SpO2 100 %.  General: Alert and oriented x 3, No apparent distress HEENT: Large cranioplasty w/ minimal swelling present on right, stable.  EOMI, sclera anicteric, oral mucosa pink and moist, poor dentition  Neck: Supple without JVD or lymphadenopathy Heart: Reg rate and rhythm. No murmurs rubs or gallops Chest: CTA bilaterally without wheezes, rales, or rhonchi; no distress Abdomen: Soft, non-tender, non-distended, bowel sounds positive. Extremities: No clubbing, cyanosis, or edema.  Psych: Pt's affect is appropriate. Pt is cooperative Skin: Crani incision and abdominal incisions c/d/I, staples removed this AM, abdominal incision covered in clean gauze.  Neuro:  pt is alert and orientedx3. Mild difficulty processing. Left central 7, mild dysarthria. L neglect, ongoing.   Musculoskeletal: LUE flaccid, +kinestape moving RUE antigravity in bed.    From prior exams, not repeated today: LLE 1+ to 2/5 quads,HF; hamstrings and HAD 1/5; 0/5 ADF/PF.  Mild finger flexor contractures left hand. LLE easily flexed to neutral. DTR's 2-3+ left side Musculoskeletal: LUE flaccid with atrophy along finger flexors and shoulder girdle muscles, shoulder subluxation 1.5 fingerbreadths and TTP from shoulder to elbow.    Assessment/Plan: 1. Functional deficits which require 3+ hours per day of interdisciplinary therapy in a comprehensive inpatient rehab setting. Physiatrist is providing close team supervision and 24 hour management of active medical problems listed below. Physiatrist and rehab team continue to assess barriers to discharge/monitor patient progress toward functional and medical goals  Care Tool:  Bathing        Body parts bathed by helper: Right arm, Left arm, Chest, Abdomen, Front perineal area, Buttocks, Left lower leg, Right lower leg, Left upper leg, Right upper leg, Face     Bathing assist Assist Level: Dependent - Patient 0%     Upper Body Dressing/Undressing Upper body dressing   What is the patient wearing?: Pull over shirt    Upper body assist Assist Level: Maximal Assistance - Patient 25 - 49%    Lower Body Dressing/Undressing Lower body dressing      What is the patient wearing?: Pants, Incontinence brief  Lower body assist Assist for lower body dressing: Total Assistance - Patient < 25%     Toileting Toileting Toileting Activity did not occur Landscape architect and hygiene only): N/A (no void or bm)  Toileting assist Assist for toileting: 2 Helpers     Transfers Chair/bed transfer  Transfers assist     Chair/bed transfer assist level: Moderate Assistance - Patient 50 - 74%     Locomotion Ambulation   Ambulation assist      Assist level: Maximal Assistance - Patient 25 - 49% Assistive device: Walker-rolling Max distance: 2 feet   Walk 10 feet activity   Assist  Walk 10 feet  activity did not occur: Safety/medical concerns        Walk 50 feet activity   Assist Walk 50 feet with 2 turns activity did not occur: Safety/medical concerns         Walk 150 feet activity   Assist Walk 150 feet activity did not occur: Safety/medical concerns         Walk 10 feet on uneven surface  activity   Assist Walk 10 feet on uneven surfaces activity did not occur: Safety/medical concerns         Wheelchair     Assist Is the patient using a wheelchair?: Yes Type of Wheelchair: Manual    Wheelchair assist level: Supervision/Verbal cueing Max wheelchair distance: 106 ft    Wheelchair 50 feet with 2 turns activity    Assist        Assist Level: Supervision/Verbal cueing   Wheelchair 150 feet activity     Assist      Assist Level: Moderate Assistance - Patient 50 - 74%   Blood pressure 111/77, pulse 66, temperature 99 F (37.2 C), resp. rate 18, height 6\' 1"  (1.854 m), weight 81 kg, SpO2 100 %.  Medical Problem List and Plan: 1. Functional deficits secondary to debility after cranioplasty and C-Diff in setting of prior right MCA stroke, left hemiplegia             -patient may not yet shower             -ELOS/Goals: 12-14 days, mod assist with PT, OT, SLP             -pt has a resting WHO for LUE             - PRAFO to maintain heel cord stretch  -Patient is beginning CIR therapies today including PT, OT, and SLP  2.  Antithrombotics: -DVT/anticoagulation:  Pharmaceutical: Eliquis             -antiplatelet therapy: none 3. Pain Management: Tylenol, oxycodone, hydrocodone, Robaxin as needed - significant pain from shoulder subluxation.              - 9/27 schedule Tylenol 1000 mg TID for pain management + Change pain regimen to oxycodone 2.5/5 mg PRN for moderate/severe pain; DC hydrocodone, DC robaxin              - For L shoulder sublux: Order OT taping and ROM, sling when OOB and not mobilizing shoulder, pillow support in bed  9/27 - improved 9/28            4. Mood/Behavior/Sleep: LCSW to evaluate and provide emotional support             -antipsychotic agents: n/a             -depression: continue Lexapro 20 mg daily             -  insomnia: continue melatonin 5. Neuropsych/cognition: This patient is capable of making decisions on his own behalf. 6. Skin/Wound Care: routine skin care checks             -monitor scalp and abdominal surgical incisions - staples removal 9/29 completed without complication  7. Fluids/Electrolytes/Nutrition:               -carb modified diet  -can dc IVF  9/24-Potassium 3.0--add supplement - 3.2 9/27-- inc supplement to 20 meq BID  - BMP 9/30    -BUN/Cr wnl    -encourage PO    Latest Ref Rng & Units 02/18/2022    7:35 AM 02/15/2022    9:30 AM 02/02/2022   10:00 AM  BMP  Glucose 70 - 99 mg/dL 174  193  243   BUN 6 - 20 mg/dL 6  <5  14   Creatinine 0.61 - 1.24 mg/dL 0.49  0.42  0.85   Sodium 135 - 145 mmol/L 142  141  141   Potassium 3.5 - 5.1 mmol/L 3.2  3.0  3.7   Chloride 98 - 111 mmol/L 103  105  109   CO2 22 - 32 mmol/L 32  26  27   Calcium 8.9 - 10.3 mg/dL 9.4  9.1  9.9      8: DM: Hgb A1c = 8.1; on SSI             -consider restarting home Lantus and Novolog    -9/24 fair control. Hold scheduled insulin for now            - 9/26 - rare PM highs >200 ~2100; otherwise well controlled             - 9/29 - consistent use of SSI 3-9 units with increased needs at night; will start Lantus 3U daily 9/30 and monitor  9: Hypertension: monitor             -continue clonidine patch 0.3 mg weekly             -continue amlodipine 10 mg daily             -continue Lopressor 25 mg BID             -continue lisinopril 40 mg daily             - BP well controlled ~130s; monitor    02/20/2022    1:33 PM 02/20/2022    8:14 AM 02/20/2022    4:46 AM  Vitals with BMI  Systolic [id number]  [id number]  Diastolic 77  87  Pulse 66 91 72     10: C. diff diarrhea: continue oral vanc, end 9/30;  discontinue Colace for now             -:BM 9/27, well formed             - Inquiring with infection control today about stopping PO vanc and removing precautions    LOS: 6 days A FACE TO FACE EVALUATION WAS PERFORMED  Gertie Gowda 02/20/2022, 1:54 PM

## 2022-02-20 NOTE — Progress Notes (Signed)
Physical Therapy Session Note  Patient Details  Name: Jacob [name] MRN: [id number] Date of Birth: May 23, 1976  Today's Date: 02/20/2022 PT Individual Time: 0850-0945 PT Individual Time Calculation (min): 55 min   Short Term Goals: Week 1:  PT Short Term Goal 1 (Week 1): Patient will perform bed mobility with min A consistently. PT Short Term Goal 2 (Week 1): Patient will perform basic transfers with min A >50% of the time. PT Short Term Goal 3 (Week 1): Patient will ambulate >100 ft using LRAD with mod A.  Skilled Therapeutic Interventions/Progress Updates:     Pt received supine in bed and agrees to therapy. No complaint of pain. Pt performs bilateral rolling with modA to assist with pericare and brief change. Following, pt performs supine to sit with cues for logrolling and body mechanics, with no physical assistance required and pt using bed features. Pt performs sit to stand and step pivot transfer to [facility] with modA and cues for lateral weight shifting and sequencing. WC transport to gym for time management. Pt performs NMR for L hemibody in parallel bars with mirror for visual feedback. Pt performs repeated toe taps on 4 inch steps with R lower extremity, with PT providing modA to block L knee, provide manual facilitation of trunk extension and upright posture, as well as manual facilitation of L hip extension and glute engagement for stability. Pt performs sets of 10 to 20 reps with seated rest breaks in between. Pt has tendency to flex forward at trunk which also exacerbates L lateral lean. Pt is able to partially correct with consistent verbal and tactile cueing as well as reminder to utilize mirror for visual feedback. WC transport back to room. Pt left seated in WC with all needs within reach.  Therapy Documentation Precautions:  Precautions Precautions: Fall Precaution Comments: L hemiplegia; painful LUE/shoulder; Required Braces or Orthoses: Splint/Cast Splint/Cast: WHO - Resting  hand splint at night Restrictions Weight Bearing Restrictions: No   Therapy/Group: Individual Therapy  Breck Coons, PT, DPT 02/20/2022, 5:15 PM

## 2022-02-20 NOTE — Progress Notes (Signed)
Patient ID: Jacob [name], male   DOB: 01-09-1976, 47 y.o.   MRN: [id number]  SW received message from [place] [phone]) reporting contact was made with patient's wife about PCS referral and he will be assigned a CM within five business days. Requested the [employer] form to be completed and faxed.  *SW left message with [facility] Department confirming that the [employer] forms from [employer] needs to be used versus their own form. SW waiting on follow-up.   Loralee Pacas, MSW, [place] Office: [phone] Cell: [phone] Fax: [phone]

## 2022-02-20 NOTE — Progress Notes (Signed)
Speech Language Pathology Daily Session Note  Patient Details  Name: Jacob [name] MRN: [id number] Date of Birth: 1975/06/25  Today's Date: 02/20/2022 SLP Individual Time: 1300-1343 SLP Individual Time Calculation (min): 43 min  Short Term Goals: Week 1: SLP Short Term Goal 1 (Week 1): Pt will participate in functional and therapeutic recall activities targeting short-term and working memory with 80% accuracy given Mod A multimodal cues. SLP Short Term Goal 2 (Week 1): Pt will attend to functional and therapeutic tasks for 15 minute intervals with no verbal cues for redirection and/or to maintain alertness. SLP Short Term Goal 3 (Week 1): Pt will complete basic problem-solving tasks with 80% accuracy given Mod A vebral and visual cues. SLP Short Term Goal 4 (Week 1): Pt will utilize visual scanning strategies to participate in functional vision tasks to include reading with 100% accuracy given Mod A verbal and visual prompts. SLP Short Term Goal 5 (Week 1): Pt will tolerate current diet of regular textures and thin liquids with no clinical s/sx concerning for airway invasion given Min A verbal cues for adherence to aspiration precautions.  Skilled Therapeutic Interventions: Skilled treatment session focused on cognitive goals. Upon arrival, patient appeared lethargic while upright in the recliner. SLP facilitated session by providing overall Max A multimodal cues for a simplified visual scanning/cancellation task. Despite cues, patient remained disorganized throughout the task and unable to utilize a more methodical strategy for scanning. Mod verbal cues were also needed for sustained attention to task due to fatigue. Patient also performed functional reading tasks with extra time and overall Mod A verbal and visual cues needed for use of visual scanning strategies. Patient left upright in recliner with alarm on and all needs within reach. Continue with current plan of care.      Pain No/Denies  Pain   Therapy/Group: Individual Therapy  Axel Meas 02/20/2022, 2:38 PM

## 2022-02-20 NOTE — Progress Notes (Addendum)
Occupational Therapy Session Note  Patient Details  Name: Jacob [name] MRN: [id number] Date of Birth: April 28, 1976  Today's Date: 02/20/2022 Session 1 OT Individual Time: 9597-4718 OT Individual Time Calculation (min): 70 min   Session 2 OT Individual Time: 5501-5868 OT Individual Time Calculation (min): 32 min    Short Term Goals: Week 1:  OT Short Term Goal 1 (Week 1): Patient will don pull over shirt with no more than mod assist OT Short Term Goal 2 (Week 1): Patient will bathe face, chest, abdomen, upper thighs and below knees following set up and cueing with min assist OT Short Term Goal 3 (Week 1): Patient will transfer to commode with min assist OT Short Term Goal 4 (Week 1): Patient will complete oral care with min assist OT Short Term Goal 5 (Week 1): Patient will tolerate passive stretch to LUE to 90* of shoulder flexion, and at least 20* of external rotation without significant increase in report of pain.  Skilled Therapeutic Interventions/Progress Updates:  Session 1   Pt greeted semi-reclined in bed with spouse present and agreeable to OT treatment session. Pt used bed rails to sit up with min A. Worked on LB and UB dressing while sitting EOB. Mod A to don shirt, then mod/max A for LB. Total A to don shoes and socks. Sit<>stand at EOB with mod A, then OT assist to pull up pants. Pt pivoted to wc with mod A. PT brought to therapy gym and worked on sit<>stands and L attention with standing BITS activity. Pt needed mod A for balance with facilitation to maintain hip and trunk extension. Max cues initially for visual scanning to the L. Pt completed 2, 2 minute sets in standing. Pt reported max fatigue after standing bouts. OT cue'd patient that if the screen is black, then there is a dot on the screen that he needed to find. Pt then demonstrated much improved scanning and attention to L with this cue. Gentle stretching and ROM on L UE. Pt brought to therapy gym and pivoted to therapy  mat with mod A. Worked on L attention, reaching, and matching. Pt returned to room and pivoted back to bed with mod A. Mod A to return to supine and Dr. Tressa Busman to take out staples.   Session 2 Pt greeted sitting in recliner and agreeable to OT Treatment session.Worked on sit<>stands facing the bed with bed rail up for support. Focus on improved sit<>stands with facilitation and verbal +  tactile cues for full hip and trunk extension. 5 sets of 10 second overhead stretch without UE support. L UE supported in sling. Sit<>stands facing raised bed, then weight bearing through B UE's for neuro re-ed.  Pt transferred back to bed with mod A stand-pivot. Pt brought into gravity eliminated sidelying position for stretching and neuro re-ed. OT provided joint input through wrist and elbow to bring pt through ROM. Pt left in supported side-lying in bed with bed alarm on, call bell in reach, and needs met.   Therapy Documentation Precautions:  Precautions Precautions: Fall Precaution Comments: L hemiplegia; painful LUE/shoulder; Required Braces or Orthoses: Splint/Cast Splint/Cast: WHO - Resting hand splint at night Restrictions Weight Bearing Restrictions: No Pain:  Pt reports pain in L shoulder, rest and repositioned for comfort.  Therapy/Group: Individual Therapy  Valma Cava 02/20/2022, 3:14 PM

## 2022-02-21 LAB — BASIC METABOLIC PANEL
Anion gap: 8 (ref 5–15)
BUN: 8 mg/dL (ref 6–20)
CO2: 28 mmol/L (ref 22–32)
Calcium: 9.2 mg/dL (ref 8.9–10.3)
Chloride: 104 mmol/L (ref 98–111)
Creatinine, Ser: 0.51 mg/dL — ABNORMAL LOW (ref 0.61–1.24)
GFR, Estimated: >60 mL/min (ref >60)
Glucose, Bld: 200 mg/dL — ABNORMAL HIGH (ref 70–99)
Potassium: 3.9 mmol/L (ref 3.5–5.1)
Sodium: 140 mmol/L (ref 135–145)

## 2022-02-21 LAB — GLUCOSE, CAPILLARY
Glucose-Capillary: 199 mg/dL — ABNORMAL HIGH (ref 70–99)
Glucose-Capillary: 177 mg/dL — ABNORMAL HIGH (ref 70–99)
Glucose-Capillary: 189 mg/dL — ABNORMAL HIGH (ref 70–99)
Glucose-Capillary: 192 mg/dL — ABNORMAL HIGH (ref 70–99)

## 2022-02-21 MED ORDER — ENSURE ENLIVE PO LIQD
237.0000 mL | Freq: Two times a day (BID) | ORAL | Status: DC
  Administered 2022-02-21 – 2022-03-07 (×25): 237 mL via ORAL

## 2022-02-21 NOTE — Progress Notes (Signed)
Occupational Therapy Session Note  Patient Details  Name: Jacob [name] MRN: [id number] Date of Birth: January 31, 1976  Today's Date: 02/21/2022 OT Individual Time: 4471-5806 OT Individual Time Calculation (min): 40 min    Short Term Goals: Week 1:  OT Short Term Goal 1 (Week 1): Patient will don pull over shirt with no more than mod assist OT Short Term Goal 2 (Week 1): Patient will bathe face, chest, abdomen, upper thighs and below knees following set up and cueing with min assist OT Short Term Goal 3 (Week 1): Patient will transfer to commode with min assist OT Short Term Goal 4 (Week 1): Patient will complete oral care with min assist OT Short Term Goal 5 (Week 1): Patient will tolerate passive stretch to LUE to 90* of shoulder flexion, and at least 20* of external rotation without significant increase in report of pain.  Skilled Therapeutic Interventions/Progress Updates:     Pt received in bed with no pain  ADL: Pt completes ADL at overall MOD A for UB sitting at EOB reaching with min rangesoutside BOS to gather clothing items. Pt able to recall sequence of dressing but needs cuing for strategies of threading LUE first. MAX A for donning shorts at sit to stand level (MOD A power up to standing), and pt able ot advance past hips in stooped posture. Total A to don gripper socks then MOD A transfer to w/c  Therapeutic activity Standing checkers game for visual scanning and equal WB into BLE with Lknee block in standing. Overall min-mod A to maintain stance with cuing for weigth shift L and terminal hip extension. Pt requires cuing for visual scanning/head turns to L to locate all pieces on board  Pt left at end of session in bed with exit alarm on, call light in reach and all needs met   Therapy Documentation Precautions:  Precautions Precautions: Fall Precaution Comments: L hemiplegia; painful LUE/shoulder; Required Braces or Orthoses: Splint/Cast Splint/Cast: WHO - Resting hand  splint at night Restrictions Weight Bearing Restrictions: No Therapy/Group: Individual Therapy  Tonny Branch 02/21/2022, 6:51 AM

## 2022-02-22 LAB — BASIC METABOLIC PANEL
Anion gap: 7 (ref 5–15)
BUN: 8 mg/dL (ref 6–20)
CO2: 27 mmol/L (ref 22–32)
Calcium: 9.4 mg/dL (ref 8.9–10.3)
Chloride: 104 mmol/L (ref 98–111)
Creatinine, Ser: 0.63 mg/dL (ref 0.61–1.24)
GFR, Estimated: >60 mL/min (ref >60)
Glucose, Bld: 177 mg/dL — ABNORMAL HIGH (ref 70–99)
Potassium: 3.9 mmol/L (ref 3.5–5.1)
Sodium: 138 mmol/L (ref 135–145)

## 2022-02-22 LAB — GLUCOSE, CAPILLARY
Glucose-Capillary: 173 mg/dL — ABNORMAL HIGH (ref 70–99)
Glucose-Capillary: 199 mg/dL — ABNORMAL HIGH (ref 70–99)
Glucose-Capillary: 155 mg/dL — ABNORMAL HIGH (ref 70–99)
Glucose-Capillary: 205 mg/dL — ABNORMAL HIGH (ref 70–99)

## 2022-02-22 LAB — CBC
HCT: 29.4 % — ABNORMAL LOW (ref 39.0–52.0)
Hemoglobin: 9.9 g/dL — ABNORMAL LOW (ref 13.0–17.0)
MCH: 28 pg (ref 26.0–34.0)
MCHC: 33.7 g/dL (ref 30.0–36.0)
MCV: 83.1 fL (ref 80.0–100.0)
Platelets: 307 10*3/uL (ref 150–400)
RBC: 3.54 MIL/uL — ABNORMAL LOW (ref 4.22–5.81)
RDW: 17.2 % — ABNORMAL HIGH (ref 11.5–15.5)
WBC: 6.5 10*3/uL (ref 4.0–10.5)
nRBC: 0 % (ref 0.0–0.2)

## 2022-02-22 NOTE — Progress Notes (Addendum)
PROGRESS NOTE   Subjective/Complaints:  No acute events overnight. No complaints this AM.   Patient requested grounds pass yesterday, was denied due to being on active contact precautions for C diff and on ongoing treatment. Will inquire with infection control regarding rules today.   ROS: Patient denies fever, rash, sore throat, blurred vision, dizziness, nausea, vomiting, diarrhea, cough, shortness of breath or chest pain, joint or back/neck pain,   or mood change.    Objective:   No results found. Recent Labs    02/22/22 0622  WBC 6.5  HGB 9.9*  HCT 29.4*  PLT 307    Recent Labs    02/21/22 0710 02/22/22 0622  NA 140 138  K 3.9 3.9  CL 104 104  CO2 28 27  GLUCOSE 200* 177*  BUN 8 8  CREATININE 0.51* 0.63  CALCIUM 9.2 9.4     Intake/Output Summary (Last 24 hours) at 02/22/2022 1534 Last data filed at 02/21/2022 1805 Gross per 24 hour  Intake 236 ml  Output --  Net 236 ml         Physical Exam: Vital Signs Blood pressure 110/85, pulse 66, temperature 98.8 F (37.1 C), resp. rate 17, height 6\' 1"  (1.854 m), weight 81 kg, SpO2 100 %.  General: Alert and oriented x 3, No apparent distress HEENT: Large cranioplasty w/ minimal swelling present on right, stable.  +Strabismus, stable. EOMI, sclera anicteric, oral mucosa pink and moist Neck: Supple without JVD or lymphadenopathy Heart: Reg rate and rhythm. No murmurs rubs or gallops Chest: CTA bilaterally without wheezes, rales, or rhonchi; no distress Abdomen: Soft, non-tender, non-distended, bowel sounds positive. Extremities: No clubbing, cyanosis, or edema.  Psych: Pt's affect is appropriate. Pt is cooperative Skin: Crani incision and abdominal incisions c/d/I, staples s/p removal.  Neuro:  pt is alert and orientedx3. Mild difficulty processing. Left central 7, mild dysarthria. L neglect, ongoing.   Musculoskeletal: LUE flaccid, +kinestape moving RUE  antigravity in bed.   From prior exams, not repeated today: LLE 1+ to 2/5 quads,HF; hamstrings and HAD 1/5; 0/5 ADF/PF.  Mild finger flexor contractures left hand. LLE easily flexed to neutral. DTR's 2-3+ left side Musculoskeletal: LUE flaccid with atrophy along finger flexors and shoulder girdle muscles, shoulder subluxation 1.5 fingerbreadths and TTP from shoulder to elbow.    Assessment/Plan: 1. Functional deficits which require 3+ hours per day of interdisciplinary therapy in a comprehensive inpatient rehab setting. Physiatrist is providing close team supervision and 24 hour management of active medical problems listed below. Physiatrist and rehab team continue to assess barriers to discharge/monitor patient progress toward functional and medical goals  Care Tool:  Bathing        Body parts bathed by helper: Right arm, Left arm, Chest, Abdomen, Front perineal area, Buttocks, Left lower leg, Right lower leg, Left upper leg, Right upper leg, Face     Bathing assist Assist Level: Dependent - Patient 0%     Upper Body Dressing/Undressing Upper body dressing   What is the patient wearing?: Pull over shirt    Upper body assist Assist Level: Maximal Assistance - Patient 25 - 49%    Lower Body Dressing/Undressing Lower body dressing  What is the patient wearing?: Pants, Incontinence brief     Lower body assist Assist for lower body dressing: Total Assistance - Patient < 25%     Toileting Toileting Toileting Activity did not occur Press photographer and hygiene only): N/A (no void or bm)  Toileting assist Assist for toileting: 2 Helpers     Transfers Chair/bed transfer  Transfers assist     Chair/bed transfer assist level: Moderate Assistance - Patient 50 - 74%     Locomotion Ambulation   Ambulation assist      Assist level: Maximal Assistance - Patient 25 - 49% Assistive device: Walker-rolling Max distance: 2 feet   Walk 10 feet  activity   Assist  Walk 10 feet activity did not occur: Safety/medical concerns        Walk 50 feet activity   Assist Walk 50 feet with 2 turns activity did not occur: Safety/medical concerns         Walk 150 feet activity   Assist Walk 150 feet activity did not occur: Safety/medical concerns         Walk 10 feet on uneven surface  activity   Assist Walk 10 feet on uneven surfaces activity did not occur: Safety/medical concerns         Wheelchair     Assist Is the patient using a wheelchair?: Yes Type of Wheelchair: Manual    Wheelchair assist level: Supervision/Verbal cueing Max wheelchair distance: 106 ft    Wheelchair 50 feet with 2 turns activity    Assist        Assist Level: Supervision/Verbal cueing   Wheelchair 150 feet activity     Assist      Assist Level: Moderate Assistance - Patient 50 - 74%   Blood pressure 110/85, pulse 66, temperature 98.8 F (37.1 C), resp. rate 17, height 6\' 1"  (1.854 m), weight 81 kg, SpO2 100 %.  Medical Problem List and Plan: 1. Functional deficits secondary to debility after cranioplasty and C-Diff in setting of prior right MCA stroke, left hemiplegia             -patient may not yet shower             -ELOS/Goals: 12-14 days, mod assist with PT, OT, SLP             -pt has a resting WHO for LUE             - PRAFO to maintain heel cord stretch  -Patient is beginning CIR therapies today including PT, OT, and SLP  2.  Antithrombotics: -DVT/anticoagulation:  Pharmaceutical: Eliquis             -antiplatelet therapy: none 3. Pain Management: Tylenol, oxycodone, hydrocodone, Robaxin as needed - significant pain from shoulder subluxation.              - 9/27 schedule Tylenol 1000 mg TID for pain management + Change pain regimen to oxycodone 2.5/5 mg PRN for moderate/severe pain; DC hydrocodone, DC robaxin              - For L shoulder sublux: Order OT taping and ROM, sling when OOB and not  mobilizing shoulder, pillow support in bed 9/27 - improved 9/28            4. Mood/Behavior/Sleep: LCSW to evaluate and provide emotional support             -antipsychotic agents: n/a             -  depression: continue Lexapro 20 mg daily             -insomnia: continue melatonin 5. Neuropsych/cognition: This patient is capable of making decisions on his own behalf. 6. Skin/Wound Care: routine skin care checks             -monitor scalp and abdominal surgical incisions - staples removal 9/29 completed without complication - healing well 9/30  7. Fluids/Electrolytes/Nutrition:               -carb modified diet  -can dc IVF  9/24-Potassium 3.0--add supplement - 3.2 9/27-- inc supplement to 20 meq BID  - BMP 9/30 improved - stable 10/1    -BUN/Cr wnl    -encourage PO    Latest Ref Rng & Units 02/22/2022    6:22 AM 02/21/2022    7:10 AM 02/18/2022    7:35 AM  BMP  Glucose 70 - 99 mg/dL 191  478  295   BUN 6 - 20 mg/dL 8  8  6    Creatinine 0.61 - 1.24 mg/dL  6.21  3.08   Sodium 135 - 145 mmol/L 138  140  142   Potassium 3.5 - 5.1 mmol/L 3.9  3.9  3.2   Chloride 98 - 111 mmol/L 104  104  103   CO2 22 - 32 mmol/L 27  28  32   Calcium 8.9 - 10.3 mg/dL 9.4  9.2  9.4      8: DM: Hgb A1c = 8.1; on SSI             -consider restarting home Lantus and Novolog    -9/24 fair control. Hold scheduled insulin for now            - 9/26 - rare PM highs >200 ~2100; otherwise well controlled             - 9/29 - consistent use of SSI 3-9 units with increased needs at night; will start Lantus 3U daily 9/30        Recent Labs    02/21/22 2102 02/22/22 0557 02/22/22 1151  GLUCAP 192* 173* 199*      9: Hypertension: monitor             -continue clonidine patch 0.3 mg weekly             -continue amlodipine 10 mg daily             -continue Lopressor 25 mg BID             -continue lisinopril 40 mg daily             - BP well controlled ~130s; monitor    02/22/2022   11:53 AM 02/22/2022     8:17 AM 02/22/2022    4:11 AM  Vitals with BMI  Systolic 110  129  Diastolic 85  78  Pulse 66 80 62     10: C. diff diarrhea: continue oral vanc, end 9/30; discontinue Colace for now             - Now daily BM, formed             - Per infection control , continue PO vanc for duration, precautions must remain for 30 days.              - 10/1 - inquiring with infection control on grounds pass once finishes with abx    LOS: 8 days A FACE TO  [place] EVALUATION WAS PERFORMED  Gertie Gowda 02/22/2022, 3:34 PM

## 2022-02-22 NOTE — Progress Notes (Signed)
Occupational Therapy Session Note  Patient Details  Name: Jacob [name] MRN: [id number] Date of Birth: [birth year]  Today's Date: 02/22/2022 OT Individual Time: 1300-1345 OT Individual Time Calculation (min): 45 min    Short Term Goals: Week 1:  OT Short Term Goal 1 (Week 1): Patient will don pull over shirt with no more than mod assist OT Short Term Goal 2 (Week 1): Patient will bathe face, chest, abdomen, upper thighs and below knees following set up and cueing with min assist OT Short Term Goal 3 (Week 1): Patient will transfer to commode with min assist OT Short Term Goal 4 (Week 1): Patient will complete oral care with min assist OT Short Term Goal 5 (Week 1): Patient will tolerate passive stretch to LUE to 90* of shoulder flexion, and at least 20* of external rotation without significant increase in report of pain.  Skilled Therapeutic Interventions/Progress Updates:  Upon OT arrival, pt seated in recliner reporting minimal pain in the L knee. Pt agreeable to OT treatment session. Treatment intervention with a focus on self care retraining and ROM. Pt donns pants seated in recliner with Max A and completes sit to stand transfer with RW and saddle splint to manage pants with Max A. Pt returns to seated position with Max A then completes stand pivot transfer from recliner to bed with Max A. Pt completes sit to supine with Mod A. Pt's brief soiled with BM and requires Total A for toileting bed level. Pt completes rolling in bed with Max-Mod A. Increased time required to complete. Pt completes supine to sit transfer with Min A using bed rail and sits EOB to complete self ROM including shoulder felx/ext, elbow flex/ext, shoulder abd/adduc, and finger extension for 1x10 reps. Pt returns to sidelying with Mod A and was left in bed at end of session with all needs met and safety measures in place.     ADL: Lower Body Dressing: Maximal assistance Where Assessed-Lower Body Dressing:  Chair Toileting: Dependent Where Assessed-Toileting: Bed level     Therapy/Group: Individual Therapy  Marvetta Gibbons 02/22/2022, 4:17 PM

## 2022-02-22 NOTE — Progress Notes (Signed)
Speech Language Pathology Daily Session Note  Patient Details  Name: Jacob [name] MRN: [id number] Date of Birth: 06/24/1975  Today's Date: 02/22/2022 SLP Individual Time: 4627-0350 SLP Individual Time Calculation (min): 40 min  Short Term Goals: Week 1: SLP Short Term Goal 1 (Week 1): Pt will participate in functional and therapeutic recall activities targeting short-term and working memory with 80% accuracy given Mod A multimodal cues. SLP Short Term Goal 2 (Week 1): Pt will attend to functional and therapeutic tasks for 15 minute intervals with no verbal cues for redirection and/or to maintain alertness. SLP Short Term Goal 3 (Week 1): Pt will complete basic problem-solving tasks with 80% accuracy given Mod A vebral and visual cues. SLP Short Term Goal 4 (Week 1): Pt will utilize visual scanning strategies to participate in functional vision tasks to include reading with 100% accuracy given Mod A verbal and visual prompts. SLP Short Term Goal 5 (Week 1): Pt will tolerate current diet of regular textures and thin liquids with no clinical s/sx concerning for airway invasion given Min A verbal cues for adherence to aspiration precautions.  Skilled Therapeutic Interventions:  Pt was seen for skilled ST targeting cognitive goals.  Upon arrival pt was with nursing in the middle of transferring from bed to recliner.  Pt was agreeable to participating in treatment.  SLP facilitated the session with basic table top symbol cancellation tasks to address visual scanning.  Pt was able to complete tasks for 100% accuracy with no more than mod verbal and visual cues for attention to detail and to facilitate visual scanning in an organized manner.  SLP increased task challenge by adding a memory component.   Pt was able to recreate basic line drawings from a box from the left side of a piece of paper onto the right side with min assist verbal cues.  When asked to do a similar task with words and numbers he could  consistently recall 4 out of 5 components with supervision cues but needed up to max assist to recall correct placement of components which SLP suspects to be related to his hemianopsia.  Pt was left in recliner with chair alarm set and call bell within reach.  Continue per current plan of care.    Pain Pain Assessment Pain Scale: 0-10 Pain Score: 0-No pain  Therapy/Group: Individual Therapy  Geanine Vandekamp, Selinda Orion 02/22/2022, 12:35 PM

## 2022-02-23 DIAGNOSIS — E1165 Type 2 diabetes mellitus with hyperglycemia: Secondary | ICD-10-CM

## 2022-02-23 DIAGNOSIS — Z794 Long term (current) use of insulin: Secondary | ICD-10-CM

## 2022-02-23 LAB — GLUCOSE, CAPILLARY
Glucose-Capillary: 154 mg/dL — ABNORMAL HIGH (ref 70–99)
Glucose-Capillary: 202 mg/dL — ABNORMAL HIGH (ref 70–99)
Glucose-Capillary: 152 mg/dL — ABNORMAL HIGH (ref 70–99)
Glucose-Capillary: 171 mg/dL — ABNORMAL HIGH (ref 70–99)

## 2022-02-23 MED ORDER — INSULIN GLARGINE-YFGN 100 UNIT/ML ~~LOC~~ SOLN
5.0000 [IU] | Freq: Every day | SUBCUTANEOUS | Status: DC
  Administered 2022-02-24: 5 [IU] via SUBCUTANEOUS
  Filled 2022-02-23 (×2): qty 0.05

## 2022-02-23 NOTE — Progress Notes (Addendum)
PROGRESS NOTE   Subjective/Complaints:  No new complaints or concerns this AM.  Reports stool was less loose yesterday.    ROS: Patient denies fever, rash, sore throat, blurred vision, dizziness, nausea, vomiting, diarrhea, cough, shortness of breath or chest pain, joint or back/neck pain,   or mood change.    Objective:   No results found. Recent Labs    02/22/22 0622  WBC 6.5  HGB 9.9*  HCT 29.4*  PLT 307    Recent Labs    02/21/22 0710 02/22/22 0622  NA 140 138  K 3.9 3.9  CL 104 104  CO2 28 27  GLUCOSE 200* 177*  BUN 8 8  CREATININE 0.51* 0.63  CALCIUM 9.2 9.4     Intake/Output Summary (Last 24 hours) at 02/23/2022 0807 Last data filed at 02/22/2022 1848 Gross per 24 hour  Intake 480 ml  Output --  Net 480 ml         Physical Exam: Vital Signs Blood pressure 139/86, pulse 64, temperature 98.7 F (37.1 C), resp. rate 18, height 6\' 1"  (1.854 m), weight 81 kg, SpO2 100 %.  General: Alert and oriented x 3, No apparent distress HEENT: Large cranioplasty w/ minimal swelling present on right, stable.  +Strabismus, stable. Conjugate gaze, sclera anicteric, oral mucosa pink and moist Neck: Supple without JVD or lymphadenopathy Heart: Reg rate and rhythm. No murmurs rubs or gallops Chest: CTA bilaterally without wheezes, rales, or rhonchi; no distress. Good air movement Abdomen: Soft, non-tender, non-distended, bowel sounds positive. Extremities: No clubbing, cyanosis, or edema.  Psych: Pt's affect is appropriate. Pt is cooperative Skin: Crani incision and abdominal incisions c/d/I, staples s/p removal.  Neuro:  pt is alert and orientedx3. Mild difficulty processing. Left central 7, mild dysarthria. L neglect, ongoing.   Musculoskeletal: LUE flaccid, +kinestape moving RUE antigravity in bed.   From prior exams, not repeated today: LLE 1+ to 2/5 quads,HF; hamstrings and HAD 1/5; 0/5 ADF/PF.  Mild finger  flexor contractures left hand. LLE easily flexed to neutral. DTR's 2-3+ left side Musculoskeletal: LUE flaccid with atrophy along finger flexors and shoulder girdle muscles, shoulder subluxation 1.5 fingerbreadths and TTP from shoulder to elbow.    Assessment/Plan: 1. Functional deficits which require 3+ hours per day of interdisciplinary therapy in a comprehensive inpatient rehab setting. Physiatrist is providing close team supervision and 24 hour management of active medical problems listed below. Physiatrist and rehab team continue to assess barriers to discharge/monitor patient progress toward functional and medical goals  Care Tool:  Bathing        Body parts bathed by helper: Right arm, Left arm, Chest, Abdomen, Front perineal area, Buttocks, Left lower leg, Right lower leg, Left upper leg, Right upper leg, Face     Bathing assist Assist Level: Dependent - Patient 0%     Upper Body Dressing/Undressing Upper body dressing   What is the patient wearing?: Pull over shirt    Upper body assist Assist Level: Maximal Assistance - Patient 25 - 49%    Lower Body Dressing/Undressing Lower body dressing      What is the patient wearing?: Pants, Incontinence brief     Lower body assist  Assist for lower body dressing: Total Assistance - Patient < 25%     Toileting Toileting Toileting Activity did not occur Press photographer and hygiene only): N/A (no void or bm)  Toileting assist Assist for toileting: 2 Helpers     Transfers Chair/bed transfer  Transfers assist     Chair/bed transfer assist level: Moderate Assistance - Patient 50 - 74%     Locomotion Ambulation   Ambulation assist      Assist level: Maximal Assistance - Patient 25 - 49% Assistive device: Walker-rolling Max distance: 2 feet   Walk 10 feet activity   Assist  Walk 10 feet activity did not occur: Safety/medical concerns        Walk 50 feet activity   Assist Walk 50 feet with 2 turns  activity did not occur: Safety/medical concerns         Walk 150 feet activity   Assist Walk 150 feet activity did not occur: Safety/medical concerns         Walk 10 feet on uneven surface  activity   Assist Walk 10 feet on uneven surfaces activity did not occur: Safety/medical concerns         Wheelchair     Assist Is the patient using a wheelchair?: Yes Type of Wheelchair: Manual    Wheelchair assist level: Supervision/Verbal cueing Max wheelchair distance: 106 ft    Wheelchair 50 feet with 2 turns activity    Assist        Assist Level: Supervision/Verbal cueing   Wheelchair 150 feet activity     Assist      Assist Level: Moderate Assistance - Patient 50 - 74%   Blood pressure 139/86, pulse 64, temperature 98.7 F (37.1 C), resp. rate 18, height 6\' 1"  (1.854 m), weight 81 kg, SpO2 100 %.  Medical Problem List and Plan: 1. Functional deficits secondary to debility after cranioplasty and C-Diff in setting of prior right MCA stroke, left hemiplegia             -patient may not yet shower             -ELOS/Goals: 12-14 days, mod assist with PT, OT, SLP             -pt has a resting WHO for LUE             - PRAFO to maintain heel cord stretch  -Patient is beginning CIR therapies today including PT, OT, and SLP  2.  Antithrombotics: -DVT/anticoagulation:  Pharmaceutical: Eliquis             -antiplatelet therapy: none 3. Pain Management: Tylenol, oxycodone, hydrocodone, Robaxin as needed - significant pain from shoulder subluxation.              - 9/27 schedule Tylenol 1000 mg TID for pain management + Change pain regimen to oxycodone 2.5/5 mg PRN for moderate/severe pain; DC hydrocodone, DC robaxin              - For L shoulder sublux: Order OT taping and ROM, sling when OOB and not mobilizing shoulder, pillow support in bed 9/27 - improved 9/28            4. Mood/Behavior/Sleep: LCSW to evaluate and provide emotional support              -antipsychotic agents: n/a             -depression: continue Lexapro 20 mg daily             -  insomnia: continue melatonin 5. Neuropsych/cognition: This patient is capable of making decisions on his own behalf. 6. Skin/Wound Care: routine skin care checks             -monitor scalp and abdominal surgical incisions - staples removal 9/29 completed without complication - healing well 9/30  7. Fluids/Electrolytes/Nutrition:               -carb modified diet  -can dc IVF  9/24-Potassium 3.0--add supplement - 3.2 9/27-- inc supplement to 20 meq BID  - BMP 9/30 improved - stable 10/1    -BUN/Cr wnl    -encourage PO    Latest Ref Rng & Units 02/22/2022    6:22 AM 02/21/2022    7:10 AM 02/18/2022    7:35 AM  BMP  Glucose 70 - 99 mg/dL 607  371  062   BUN 6 - 20 mg/dL 8  8  6    Creatinine 0.61 - 1.24 mg/dL  6.94  8.54   Sodium 135 - 145 mmol/L 138  140  142   Potassium 3.5 - 5.1 mmol/L 3.9  3.9  3.2   Chloride 98 - 111 mmol/L 104  104  103   CO2 22 - 32 mmol/L 27  28  32   Calcium 8.9 - 10.3 mg/dL 9.4  9.2  9.4      8: DM: Hgb A1c = 8.1; on SSI             -consider restarting home Lantus and Novolog    -9/24 fair control. Hold scheduled insulin for now            - 9/26 - rare PM highs >200 ~2100; otherwise well controlled             - 9/29 - consistent use of SSI 3-9 units with increased needs at night; will start Lantus 3U daily 9/30  10/2 Increase Lantus to 5U daily        Recent Labs    02/22/22 1615 02/22/22 2058 02/23/22 0554  GLUCAP 155* 205* 154*       9: Hypertension: monitor             -continue clonidine patch 0.3 mg weekly             -continue amlodipine 10 mg daily             -continue Lopressor 25 mg BID             -continue lisinopril 40 mg daily             - BP well controlled ~130s; monitor  -10/2 BP continues to be well controlled, follow    02/23/2022    2:40 AM 02/22/2022    7:56 PM 02/22/2022   11:53 AM  Vitals with BMI  Systolic 139 134  110  Diastolic 86 88 85  Pulse 64 72 66     10: C. diff diarrhea: continue oral vanc, end 9/30; discontinue Colace for now             - Now daily BM, formed             - Per infection control , continue PO vanc for duration, precautions must remain for 30 days.              - 10/1 - inquiring with infection control on grounds pass once finishes with abx  -10/2 Reports stool more firm yesterday LOS: 9 days  A FACE TO FACE EVALUATION WAS PERFORMED  Fanny Dance 02/23/2022, 8:07 AM

## 2022-02-23 NOTE — Progress Notes (Signed)
Speech Language Pathology Weekly Progress and Session Note  Patient Details  Name: Jacob [name] MRN: [id number] Date of Birth: September 04, 1975  Beginning of progress report period: February 14, 2022 End of progress report period: February 23, 2022  Today's Date: 02/23/2022 SLP Individual Time: 1700-1749 SLP Individual Time Calculation (min): 44 min  Short Term Goals: Week 1: SLP Short Term Goal 1 (Week 1): Pt will participate in functional and therapeutic recall activities targeting short-term and working memory with 80% accuracy given Mod A multimodal cues. SLP Short Term Goal 1 - Progress (Week 1): Not met SLP Short Term Goal 2 (Week 1): Pt will attend to functional and therapeutic tasks for 15 minute intervals with no verbal cues for redirection and/or to maintain alertness. SLP Short Term Goal 2 - Progress (Week 1): Not met SLP Short Term Goal 3 (Week 1): Pt will complete basic problem-solving tasks with 80% accuracy given Mod A vebral and visual cues. SLP Short Term Goal 3 - Progress (Week 1): Met SLP Short Term Goal 4 (Week 1): Pt will utilize visual scanning strategies to participate in functional vision tasks to include reading with 100% accuracy given Mod A verbal and visual prompts. SLP Short Term Goal 4 - Progress (Week 1): Met SLP Short Term Goal 5 (Week 1): Pt will tolerate current diet of regular textures and thin liquids with no clinical s/sx concerning for airway invasion given Min A verbal cues for adherence to aspiration precautions. SLP Short Term Goal 5 - Progress (Week 1): Met    New Short Term Goals: Week 2: SLP Short Term Goal 1 (Week 2): Pt will participate in functional and therapeutic recall activities targeting short-term and working memory with 80% accuracy given Mod A multimodal cues. SLP Short Term Goal 2 (Week 2): Pt will attend to functional and therapeutic tasks for 15 minute intervals with Mod verbal cues for redirection and/or to maintain alertness. SLP Short  Term Goal 3 (Week 2): Pt will complete basic problem-solving tasks with 80% accuracy given Min A vebral and visual cues. SLP Short Term Goal 4 (Week 2): Pt will utilize visual scanning strategies to participate in functional vision tasks to include reading with 100% accuracy given Min A verbal and visual prompts. SLP Short Term Goal 5 (Week 2): Pt will tolerate current diet of regular textures and thin liquids with no clinical s/sx concerning for airway invasion given supervision verbal cues for adherence to aspiration precautions.  Weekly Progress Updates: Patient has made functional gains and has met 3 of 5 STGs this reporting period. Currently, patient is consuming regular textures with thin liquids with minimal overt s/s of aspiration and requires Min verbal cues for use of swallowing compensatory strategies. Patient also requires overall Mod A multimodal cues to complete basic and familiar tasks safely in regards to sustained attention, functional problem solving, recall with use of strategies, and left visual scanning. Patient and family education ongoing. Patient would benefit from continued skilled SLP intervention to maximize his cognitive and swallowing function prior to discharge.      Intensity: Minumum of 1-2 x/day, 30 to 90 minutes Frequency: 3 to 5 out of 7 days Duration/Length of Stay: 03/07/22 Treatment/Interventions: Cognitive remediation/compensation;Patient/family education;Internal/external aids;Functional tasks;Cueing hierarchy;Environmental controls;Therapeutic Activities;Dysphagia/aspiration precaution training   Daily Session  Skilled Therapeutic Interventions: Skilled treatment session focused on cognitive goals. Upon arrival, patient was awake in bed. Patient agreeable to treatment session but declined getting out of bed this morning. Patient had been incontinent of urine with minimal awareness  with total A provided at bed level for peri care. SLP facilitated session by  providing Mod A verbal cues for sustained attention and Mod verbal and visual cues for use of compensatory strategies to maximize visual scanning/attention to left field of environment during a mildly complex medication management task in which he had to identify errors in a BID pill box.  Patient also observed taking whole medications with thin liquids without overt s/s of aspiration. However, SLP provided education regarding maximizing safety by taking only 1-2 pills at a time. Patient verbalized understanding. Patient left upright in bed with alarm on and all needs within reach. Continue with current plan of care.    Pain No/Denies Pain   Therapy/Group: Individual Therapy  Dalisa Forrer 02/23/2022, 6:38 AM

## 2022-02-23 NOTE — Progress Notes (Signed)
Physical Therapy Session Note  Patient Details  Name: Jacob [name] MRN: [id number] Date of Birth: 14-Nov-1975  Today's Date: 02/23/2022 PT Individual Time: 6295-2841 PT Individual Time Calculation (min): 44 min  and Today's Date: 02/23/2022 PT Missed Time: 71 Minutes Missed Time Reason: Patient fatigue  Short Term Goals: Week 1:  PT Short Term Goal 1 (Week 1): Patient will perform bed mobility with min A consistently. PT Short Term Goal 2 (Week 1): Patient will perform basic transfers with min A >50% of the time. PT Short Term Goal 3 (Week 1): Patient will ambulate >100 ft using LRAD with mod A.  Skilled Therapeutic Interventions/Progress Updates:     1st Session: pt received supine in bed and is deeply sleeping. PT will follow up as able.  2nd Session: Pt received supine in bed and agrees to therapy. No complaint of pain. Supine to sit slowly with cues for positioning at EOB and body mechanics to facilitate scooting. PT dons pt's shoes while seated EOB for time management. PT also provides totalA to one L upper extremity sling to support L glenohumeral joint. Pt performs stand pivot transfer to [facility] with modA and cues for body mechanics, anterior weight shift, powering up, and sequencing for transfer. WC transport to gym for time management. PT provides pt with standard 16" manual WC to replace high backed reclining WC. Pt performs stand pivot to new WC with modA/maxA due to posterior bias and difficulty with sequencing. Pt then performs sit to stand holding onto one parallel bar and facing mirror for visual feedback. Pt completes lateral weight shifting with PT blocking L knee and providing cues for trunk extension and posture, as well as facilitating lateral weight shifts..Pt progress to performing 2x10 R lower extremity lifts to increase loading through L lower extremity.   WC transport back to room. Stand pivot to bed with maxA and decreased eccentric control of stand to sit. Sit to supine  with modA management of L lower extremity and trunk management. Pt left supine with alarm intact and all needs within reach.  Therapy Documentation Precautions:  Precautions Precautions: Fall Precaution Comments: L hemiplegia; painful LUE/shoulder; Required Braces or Orthoses: Splint/Cast Splint/Cast: WHO - Resting hand splint at night Restrictions Weight Bearing Restrictions: No    Therapy/Group: Individual Therapy  Breck Coons, PT, DPT 02/23/2022, 4:23 PM

## 2022-02-23 NOTE — Progress Notes (Signed)
Occupational Therapy Note  Patient Details  Name: Jacob [name] MRN: [id number] Date of Birth: 08/21/1975  Today's Date: 02/23/2022 OT Missed Time: 57 Minutes Missed Time Reason: Patient fatigue  Patient greeted semi-reclined in bed asleep with lights down. OT able to wake pt up, but he stated he was tired and did not want to get out of bed at this time. OT to return as time permits.    Daneen Schick Sanyla Summey 02/23/2022, 10:58 AM

## 2022-02-24 LAB — GLUCOSE, CAPILLARY
Glucose-Capillary: 119 mg/dL — ABNORMAL HIGH (ref 70–99)
Glucose-Capillary: 163 mg/dL — ABNORMAL HIGH (ref 70–99)
Glucose-Capillary: 186 mg/dL — ABNORMAL HIGH (ref 70–99)
Glucose-Capillary: 208 mg/dL — ABNORMAL HIGH (ref 70–99)
Glucose-Capillary: 226 mg/dL — ABNORMAL HIGH (ref 70–99)

## 2022-02-24 MED ORDER — BACLOFEN 10 MG PO TABS
10.0000 mg | ORAL_TABLET | Freq: Two times a day (BID) | ORAL | Status: DC
  Administered 2022-02-24 – 2022-02-25 (×3): 10 mg via ORAL
  Filled 2022-02-24 (×3): qty 1

## 2022-02-24 NOTE — Progress Notes (Signed)
PROGRESS NOTE   Subjective/Complaints:  Patient seen at bedside. No acute complaints. Sleeping well. Eating well.  Endorses wearing WHO and PRAFO at night, but struggling with WHO due to pain in his wrist and fingers. Regarding bowel and bladder, he states that he feels the urge to go for both, but "sometimes it just comes out" and he generally cannot wait for nursing to assist with toiletting.  Followed up with nursing regarding grounds pass, will inquire with infection control today given he has finished C diff tx.   ROS: Patient denies fever, rash, sore throat, blurred vision, dizziness, nausea, vomiting, diarrhea, cough, shortness of breath or chest pain, joint or back/neck pain,   or mood change.    Objective:   No results found. Recent Labs    02/22/22 0622  WBC 6.5  HGB 9.9*  HCT 29.4*  PLT 307    Recent Labs    02/22/22 0622  NA 138  K 3.9  CL 104  CO2 27  GLUCOSE 177*  BUN 8  CREATININE 0.63  CALCIUM 9.4     Intake/Output Summary (Last 24 hours) at 02/24/2022 0923 Last data filed at 02/23/2022 1757 Gross per 24 hour  Intake 472 ml  Output --  Net 472 ml         Physical Exam: Vital Signs Blood pressure (!) 125/96, pulse 65, temperature 98.3 F (36.8 C), resp. rate 18, height 6\' 1"  (1.854 m), weight 81 kg, SpO2 100 %.  General: Alert and oriented x 3, No apparent distress HEENT: Large cranioplasty w/out swelling, stable.  +Strabismus, stable. sclera anicteric, oral mucosa pink and moist Neck: Supple without JVD or lymphadenopathy Heart: Reg rate and rhythm. No murmurs rubs or gallops Chest: CTA bilaterally without wheezes, rales, or rhonchi; no distress. Good air movement Abdomen: Soft, non-tender, non-distended, bowel sounds positive. Extremities: No clubbing, cyanosis, or edema.  Psych: Pt's affect is appropriate. Pt is cooperative Skin: Crani incision and abdominal incisions c/d/I,  staples s/p removal.  Neuro:  pt is alert and orientedx3. Mild difficulty processing. Left central 7, mild dysarthria. L neglect, improved.  Tone: LUE MAS 4/contractures in finger flexors, MAS 3 wrist flexors, MAS 3 eblow flexors, MAS 2 elbow extensors LLE MAS 2 knee flexors, MAS 2 hip flexors, MAS 2 hip adductors, MAS 1 ankle plantarflexors Relfexes: 2-3 beats clonus L ankle Musculoskeletal: shoulder subluxation 1.5 fingerbreadths and TTP throughout LUE.    Assessment/Plan: 1. Functional deficits which require 3+ hours per day of interdisciplinary therapy in a comprehensive inpatient rehab setting. Physiatrist is providing close team supervision and 24 hour management of active medical problems listed below. Physiatrist and rehab team continue to assess barriers to discharge/monitor patient progress toward functional and medical goals  Care Tool:  Bathing        Body parts bathed by helper: Right arm, Left arm, Chest, Abdomen, Front perineal area, Buttocks, Left lower leg, Right lower leg, Left upper leg, Right upper leg, Face     Bathing assist Assist Level: Dependent - Patient 0%     Upper Body Dressing/Undressing Upper body dressing   What is the patient wearing?: Pull over shirt    Upper body assist  Assist Level: Maximal Assistance - Patient 25 - 49%    Lower Body Dressing/Undressing Lower body dressing      What is the patient wearing?: Pants, Incontinence brief     Lower body assist Assist for lower body dressing: Total Assistance - Patient < 25%     Toileting Toileting Toileting Activity did not occur Press photographer and hygiene only): N/A (no void or bm)  Toileting assist Assist for toileting: 2 Helpers     Transfers Chair/bed transfer  Transfers assist     Chair/bed transfer assist level: Moderate Assistance - Patient 50 - 74%     Locomotion Ambulation   Ambulation assist      Assist level: Maximal Assistance - Patient 25 - 49% Assistive  device: Walker-rolling Max distance: 2 feet   Walk 10 feet activity   Assist  Walk 10 feet activity did not occur: Safety/medical concerns        Walk 50 feet activity   Assist Walk 50 feet with 2 turns activity did not occur: Safety/medical concerns         Walk 150 feet activity   Assist Walk 150 feet activity did not occur: Safety/medical concerns         Walk 10 feet on uneven surface  activity   Assist Walk 10 feet on uneven surfaces activity did not occur: Safety/medical concerns         Wheelchair     Assist Is the patient using a wheelchair?: Yes Type of Wheelchair: Manual    Wheelchair assist level: Supervision/Verbal cueing Max wheelchair distance: 106 ft    Wheelchair 50 feet with 2 turns activity    Assist        Assist Level: Supervision/Verbal cueing   Wheelchair 150 feet activity     Assist      Assist Level: Moderate Assistance - Patient 50 - 74%   Blood pressure (!) 125/96, pulse 65, temperature 98.3 F (36.8 C), resp. rate 18, height 6\' 1"  (1.854 m), weight 81 kg, SpO2 100 %.  Medical Problem List and Plan: 1. Functional deficits secondary to debility after cranioplasty and C-Diff in setting of prior right MCA stroke, left hemiplegia             -patient may not yet shower             -ELOS/Goals: 12-14 days, mod assist with PT, OT, SLP             -pt has a resting WHO for LUE - 10/3 add washcloth roll if poor WHO tolerance             - PRAFO to maintain heel cord stretch  -Patient is beginning CIR therapies today including PT, OT, and SLP  2.  Antithrombotics: -DVT/anticoagulation:  Pharmaceutical: Eliquis             -antiplatelet therapy: none 3. Pain Management: Tylenol, oxycodone, hydrocodone, Robaxin as needed - significant pain from shoulder subluxation.              - 9/27 schedule Tylenol 1000 mg TID for pain management + Change pain regimen to oxycodone 2.5/5 mg PRN for moderate/severe pain; DC  hydrocodone, DC robaxin              - For L shoulder sublux: Order OT taping and ROM, sling when OOB and not mobilizing shoulder, pillow support in bed 9/27 - improved 9/28               -  10/4 DC PRN Oxycodone due to no use in several days               - add baclofen 10 mg BID for worsening LUE and LLE tone; monitor for cognitive effects  4. Mood/Behavior/Sleep: LCSW to evaluate and provide emotional support             -antipsychotic agents: n/a             -depression: continue Lexapro 20 mg daily             -insomnia: continue melatonin; DC trazadone d/t no use 5. Neuropsych/cognition: This patient is capable of making decisions on his own behalf. 6. Skin/Wound Care: routine skin care checks             -monitor scalp and abdominal surgical incisions - staples removal 9/29 completed without complication - healing well   7. Fluids/Electrolytes/Nutrition:               -carb modified diet  -can dc IVF  9/24-Potassium 3.0--add supplement - 3.2 9/27-- inc supplement to 20 meq BID  - BMP 9/30 improved - stable 10/1    -BUN/Cr wnl    -encourage PO    Latest Ref Rng & Units 02/22/2022    6:22 AM 02/21/2022    7:10 AM 02/18/2022    7:35 AM  BMP  Glucose 70 - 99 mg/dL 740  814  481   BUN 6 - 20 mg/dL 8  8  6    Creatinine 0.61 - 1.24 mg/dL  8.56  3.14   Sodium 135 - 145 mmol/L 138  140  142   Potassium 3.5 - 5.1 mmol/L 3.9  3.9  3.2   Chloride 98 - 111 mmol/L 104  104  103   CO2 22 - 32 mmol/L 27  28  32   Calcium 8.9 - 10.3 mg/dL 9.4  9.2  9.4      8: DM: Hgb A1c = 8.1; on SSI             -consider restarting home Lantus and Novolog    -9/24 fair control. Hold scheduled insulin for now            - 9/26 - rare PM highs >200 ~2100; otherwise well controlled             - 9/29 - consistent use of SSI 3-9 units with increased needs at night; will start Lantus 3U daily 9/30            - 10/2 Increase Lantus to 5U daily        Recent Labs    02/23/22 2050 02/24/22 0001  02/24/22 0635  GLUCAP 171* 186* 163*       9: Hypertension: monitor             -continue clonidine patch 0.3 mg weekly             -continue amlodipine 10 mg daily             -continue Lopressor 25 mg BID             -continue lisinopril 40 mg daily             - BP well controlled ~120s; monitor    02/24/2022    3:04 AM 02/23/2022    7:37 PM 02/23/2022   12:53 PM  Vitals with BMI  Systolic 125 127 04/25/2022  Diastolic 96 80 84  Pulse 65 67 65     10: C. diff diarrhea: continue oral vanc, end 9/30; discontinue Colace for now             - Now daily BM, formed             - Per infection control , continue PO vanc for duration, precautions must remain for 30 days.              - 10/1 - inquiring with infection control on grounds pass once finishes with abx - following up today  -10/2 Reports stool more firm yesterday 11. Incontinence of bowel and bladder             - 10/4 pt reports sensate for both, can't hold; initiate timed toiletting Q4H  LOS: 10 days A FACE TO FACE EVALUATION WAS PERFORMED  Gertie Gowda 02/24/2022, 9:23 AM

## 2022-02-24 NOTE — Progress Notes (Signed)
Occupational Therapy Session Note  Patient Details  Name: KORDEL LEAVY MRN: [id number] Date of Birth: [birth year]  Today's Date: 02/24/2022 OT Individual Time: 1105-1205 OT Individual Time Calculation (min): 60 min   Short Term Goals: Week 1:  OT Short Term Goal 1 (Week 1): Patient will don pull over shirt with no more than mod assist OT Short Term Goal 1 - Progress (Week 1): Met OT Short Term Goal 2 (Week 1): Patient will bathe face, chest, abdomen, upper thighs and below knees following set up and cueing with min assist OT Short Term Goal 2 - Progress (Week 1): Met OT Short Term Goal 3 (Week 1): Patient will transfer to commode with min assist OT Short Term Goal 3 - Progress (Week 1): Progressing toward goal OT Short Term Goal 4 (Week 1): Patient will complete oral care with min assist OT Short Term Goal 4 - Progress (Week 1): Met OT Short Term Goal 5 (Week 1): Patient will tolerate passive stretch to LUE to 90* of shoulder flexion, and at least 20* of external rotation without significant increase in report of pain. OT Short Term Goal 5 - Progress (Week 1): Progressing toward goal  Skilled Therapeutic Interventions/Progress Updates:    Pt greeted seated in recliner awake and agreeable to OT treatment session. Pt did not recall OT's attempt to see patient for therapy yesterday. Pt declined need for BADL tasks and was already dressed for the day. Worked on wc mobility using hemi techniques in standard wc. Pt needed moderate cues and min A to get started. Pt transferred to therpay mat with mod A. Worked on standing and wieght shifting using PNF patterns and NDT techniques. OT facilitated weight bearing through LLE to keep L heel from lifting off of the ground. Pt reach down to the L, then up to hit number held by therpay tech on R upper side. Pt needed mod/max for balance> activity also facilitated trunk extension when reaching. Focus on high repetition with improved righting, balance, and  weight shifting. L UE NMR with joing input through wrist and hand to perform ROM. Applied 1:1 NMES to CH1 supraspinatus and middle deltoid to help approximate shoulder joint and decrease pain.   Ratio 1:1 Rate 35 pps Waveform- Asymmetric Ramp 1.0 Pulse 300 CH1 Intensity- 25  Duration -  15  Pt returned to room and performed stand-pivot to recliner with mod A. Nurse tech entering room, handoff to NT.  Therapy Documentation Precautions:  Precautions Precautions: Fall Precaution Comments: L hemiplegia; painful LUE/shoulder; Required Braces or Orthoses: Splint/Cast Splint/Cast: WHO - Resting hand splint at night Restrictions Weight Bearing Restrictions: No  Pain: Pt reports L shoulder pain, no number given. Rest and repositioned.   Therapy/Group: Individual Therapy  Valma Cava 02/24/2022, 11:55 AM

## 2022-02-24 NOTE — Progress Notes (Signed)
Occupational Therapy Session Note  Patient Details  Name: Jacob [name] MRN: [id number] Date of Birth: Sep 22, 1975  Today's Date: 02/24/2022 OT Individual Time: 1445-1530 OT Individual Time Calculation (min): 45 min    Short Term Goals: Week 2:  OT Short Term Goal 1 (Week 2): Patient will transfer to commode with min assist OT Short Term Goal 2 (Week 2): Patient will tolerate passive stretch to LUE to 90* of shoulder flexion, and at least 20* of external rotation without significant increase in report of pain. OT Short Term Goal 3 (Week 2): Patient will demonstrate improved attention to L side by locating 2 items on L side of the sink with min questioning cues.  Skilled Therapeutic Interventions/Progress Updates:    Patient agreeable to participate in OT session after education provided on purpose of inpatient rehab. Pt initially stated that he did not want to do anything. Session completed in pt's room. Reports 0/10 pain level.   Patient participated in skilled OT session focusing on manual therapy, passive ROM, proper positioning in bed, and patient education. Pt in bed with sling donned on LUE. Pt reports that he doesn't need to wear it all the time but he didn't feel like taking it off earlier. Education provided on use of sling to support LUE when up in w/c, transferring, or mobilizing out of bed only. When in bed, sling should be off with LUE positioned and supported properly with pillows to decrease amount of pull and strain on neck and to allow for therapy to range LUE. Therapist assess left shoulder for fascial restrictions noting moderate restrictions and trigger point palpated in anterior shoulder/deltoid region. Myofascial release and manual stretching completed to decrease fascial restrictions and increase joint mobility in a pain free zone. Pt initially resistive to gentle stretching although provided with education on benefits and allowing the shoulder to slowly do the movement to  increase tolerance.  Supine, left, shoulder flexion, er, 10X completed. Pt able to tolerate shoulder flexion to 90 degrees. Due to increased joint tightness, er was tolerated to ~5-10 degrees only. At end of session, pt was positioned supine. Pillow placed under bilateral knees, behind left scapula, and under left elbow for proper support of the UE. Warm compress provided and placed on left anterior shoulder to help relax tight shoulder and help decrease soreness post session.    Therapy Documentation Precautions:  Precautions Precautions: Fall Precaution Comments: L hemiplegia; painful LUE/shoulder; Required Braces or Orthoses: Splint/Cast Splint/Cast: WHO - Resting hand splint at night Restrictions Weight Bearing Restrictions: No   Therapy/Group: Individual Therapy  Ailene Ravel, OTR/L,CBIS  Supplemental OT - [facility]  02/24/2022, 2:59 PM

## 2022-02-24 NOTE — Progress Notes (Signed)
Physical Therapy Weekly Progress Note  Patient Details  Name: Jacob [name] MRN: [id number] Date of Birth: Oct 08, 1975  Beginning of progress report period: February 15, 2022 End of progress report period: February 24, 2022  Today's Date: 02/24/2022 PT Individual Time: 3267-1245 and 8099-8338 PT Individual Time Calculation (min): 27 min and 70 min  Patient has met 0 of 3 short term goals.  Pt is progressing slowly toward mobility goals, with gradual improvement in bed mobility, independence with functional transfers, and ambulation. Pt limited primarily by L sided hemiplegia as well as associated tone and poor motor control. Pt is performing bed mobility at minA to modA level, lateral transfers to the R at CGA/minA, stand step transfers at [facility], and gait with +2 WC follow and modA. Pt will benefit from hands on family education prior to discharge.  Patient continues to demonstrate the following deficits muscle weakness, decreased cardiorespiratoy endurance, impaired timing and sequencing, abnormal tone, and decreased coordination, decreased attention to left, and decreased sitting balance, decreased standing balance, decreased postural control, hemiplegia, and decreased balance strategies and therefore will continue to benefit from skilled PT intervention to increase functional independence with mobility.  Patient progressing toward long term goals..  Continue plan of care.  PT Short Term Goals Week 1:  PT Short Term Goal 1 (Week 1): Patient will perform bed mobility with min A consistently. PT Short Term Goal 1 - Progress (Week 1): Progressing toward goal PT Short Term Goal 2 (Week 1): Patient will perform basic transfers with min A >50% of the time. PT Short Term Goal 2 - Progress (Week 1): Progressing toward goal PT Short Term Goal 3 (Week 1): Patient will ambulate >100 ft using LRAD with mod A. PT Short Term Goal 3 - Progress (Week 1): Progressing toward goal Week 2:  PT Short Term Goal 1  (Week 2): Pt will perform bed mobility with minA consistently. PT Short Term Goal 2 (Week 2): Pt will perform basic transfers with minA >50% of time. PT Short Term Goal 3 (Week 2): Pt will ambulate 100' with LRAD and modA.  Skilled Therapeutic Interventions/Progress Updates:  Ambulation/gait training;Discharge planning;Cognitive remediation/compensation;DME/adaptive equipment instruction;Functional mobility training;Pain management;Psychosocial support;Therapeutic Activities;Splinting/orthotics;UE/LE Strength taining/ROM;Visual/perceptual remediation/compensation;Wheelchair propulsion/positioning;UE/LE Coordination activities;Therapeutic Exercise;Stair training;Skin care/wound management;Patient/family education;Neuromuscular re-education;Functional electrical stimulation;Disease management/prevention;Community reintegration;Balance/vestibular training   1st Session: Pt received supine in ed and agrees to therapy. No complaint of pain. Pt noted to be soiled of bowel and bladder. Pt performs bilateral rolling with modA and cues for body mechanics. PT provides totalA for pericare. Pt then performs supine to sit with bed features and cues for sequencing and positioning. PT threads pants over pt's legs and dons shoes with totalA for time management. PT also assists to don sling for L upper extremity.Pt performs sit to stand with R hand support on RW and modA to facilitate anterior weight shift and upright posture. Pt takes 2-3 steps with modA and blocking of L knee during stance phase, then transfers to recliner with cues for increased eccentric control for safety. Pt left seated in WC with all needs within reach.  2nd Session: Pt received seated in recliner and agrees to therapy. No complaint of pain. Sit to stand and stand step transfer to the L with modA/maxA, with PT facilitating anterior weight shift, posture, and significant assistance with eccentric control of stand to sit. WC transport to gym. Pt  performs block practice of lateral transfers. PT educates on performing transfer to the R for maximal safety, importance of  head hips relationship, and hand placement. PT performs lateral transfer to the R from [facility] to mat with CGA and cues for initiation and positioning. Transfer from mat to [facility] on the R requires minA due to being slightly uphil. Pt then attempts transfer to the L and requires modA to facilitate anterior weight shift, as well as placement of L lower extremity to provide adequate support and optimal mechanics. Transfer back to the R requires minA and same cues. WC transport outside. PT and and pt have extended discussion regarding pt's stay in rehab and expectations for remainder of admission, as well as discharge. WC transport inside. Pt performs sit to stand with minA and hemiwalker on R, with cues for hand placement and body mechanics. Pt practices stepping forward and backward with R lower extremity, with PT blocking L lower extremity as well as providing manual support at trunk. Pt requires modA/maxA for balance and to prevent buckling of L knee. WC transport back to room. Pt performs lateral transfer to the L from [facility] to bed with modA to facilitate anterior weight shift. Pt left supine with all needs within reach.  Therapy Documentation Precautions:  Precautions Precautions: Fall Precaution Comments: L hemiplegia; painful LUE/shoulder; Required Braces or Orthoses: Splint/Cast Splint/Cast: WHO - Resting hand splint at night Restrictions Weight Bearing Restrictions: No  Therapy/Group: Individual Therapy  Breck Coons, PT, DPT 02/24/2022, 6:13 PM

## 2022-02-24 NOTE — Progress Notes (Signed)
Occupational Therapy Weekly Progress Note  Patient Details  Name: Jacob [name] MRN: [id number] Date of Birth: [birth year]  Beginning of progress report period: February 15, 2022 End of progress report period: February 24, 2022  Patient has met 3 of 5 short term goals.  Patients is progressing slow towards OT goals, but has made some small gain this week with sit<>stands and some trunk control within BADL tasks. Pt continues to require max A for LB ADLs and mod A for UB ADLs. Transfers have stayed at a mod a overall for stand-pivots. Continue current POC for now.  Patient continues to demonstrate the following deficits: muscle weakness, decreased cardiorespiratoy endurance, impaired timing and sequencing, abnormal tone, unbalanced muscle activation, motor apraxia, ataxia, decreased coordination, and decreased motor planning, decreased midline orientation, decreased attention to left, left side neglect, and decreased motor planning, decreased initiation, decreased attention, decreased awareness, decreased problem solving, decreased safety awareness, decreased memory, and delayed processing, and decreased sitting balance, decreased standing balance, decreased postural control, hemiplegia, and decreased balance strategies and therefore will continue to benefit from skilled OT intervention to enhance overall performance with BADL and Reduce care partner burden.  Patient progressing toward long term goals..  Continue plan of care.  OT Short Term Goals Week 1:  OT Short Term Goal 1 (Week 1): Patient will don pull over shirt with no more than mod assist OT Short Term Goal 1 - Progress (Week 1): Met OT Short Term Goal 2 (Week 1): Patient will bathe face, chest, abdomen, upper thighs and below knees following set up and cueing with min assist OT Short Term Goal 2 - Progress (Week 1): Met OT Short Term Goal 3 (Week 1): Patient will transfer to commode with min assist OT Short Term Goal 3 - Progress (Week  1): Progressing toward goal OT Short Term Goal 4 (Week 1): Patient will complete oral care with min assist OT Short Term Goal 4 - Progress (Week 1): Met OT Short Term Goal 5 (Week 1): Patient will tolerate passive stretch to LUE to 90* of shoulder flexion, and at least 20* of external rotation without significant increase in report of pain. OT Short Term Goal 5 - Progress (Week 1): Progressing toward goal Week 2:  OT Short Term Goal 1 (Week 2): Patient will transfer to commode with min assist OT Short Term Goal 2 (Week 2): Patient will tolerate passive stretch to LUE to 90* of shoulder flexion, and at least 20* of external rotation without significant increase in report of pain. OT Short Term Goal 3 (Week 2): Patient will demonstrate improved attention to L side by locating 2 items on L side of the sink with min questioning cues.  Therapy Documentation Precautions:  Precautions Precautions: Fall Precaution Comments: L hemiplegia; painful LUE/shoulder; Required Braces or Orthoses: Splint/Cast Splint/Cast: WHO - Resting hand splint at night Restrictions Weight Bearing Restrictions: No Pain:  Denies pain   Therapy/Group: Individual Therapy  Valma Cava 02/24/2022, 1:04 PM

## 2022-02-24 NOTE — Patient Care Conference (Signed)
Inpatient RehabilitationTeam Conference and Plan of Care Update Date: 02/24/2022   Time: 10:33 AM    Patient Name: Jacob [name]      Medical Record Number: [id number]  Date of Birth: March 26, 1976 Sex: Male         Room/Bed: 4M04C/4M04C-01 Payor Info: Payor: [employer] / Plan: BCBS COMM PPO / Product Type: *No Product type* /    Admit Date/Time:  02/14/2022  4:07 PM  Primary Diagnosis:  Debility  Hospital Problems: Principal Problem:   Debility Active Problems:   Type 2 diabetes mellitus with hyperglycemia, with long-term current use of insulin [facility])    Expected Discharge Date: Expected Discharge Date: 03/07/22  Team Members Present: Physician leading conference: Other (comment) (Dr. Shearon Stalls) Social Worker Present: Cecile Sheerer, [name] Nurse Present: Chana Bode, RN PT Present: Malachi Pro, PT OT Present: Kearney Hard, OT SLP Present: Feliberto Gottron, SLP PPS Coordinator present : Edson Snowball, PT     Current Status/Progress Goal Weekly Team Focus  Bowel/Bladder   Continent/incontinent of bowel and bladder. Last BM 10/2.  Patient will gain continence of b/b.  Monitor changes in bowel and bladder function.   Swallow/Nutrition/ Hydration   Regular textures with thin liquids, intermittent supervision  Supervision  tolerance of current diet, use of swallowing compensatory strategies   ADL's   Mod A stand-pivots, Sit<>stands can be as little as min A, more difficulty with transitional movments, max A LB ADLs, Mod/max A UB ADLss  Min overall  Transfers, pt/family education, L UE NMR, NMES, left attention, self-care retraining   Mobility   minA/modA bed mobility, modA sit to stand, modA 30' with R handrail and +2 WC follow  minA  L hemibody NMR, balance, transfers, ambulation   Communication             Safety/Cognition/ Behavioral Observations  Mod A  Supervision  sustained attention, visual scanning/attention to left, functional problem solving, emergent  awareness   Pain   Denies pain.  Pain free.  Assess for pain q shift and prn.   Skin   Abrasion to L knee,OTA. Healed incision to head/abdomen.  No skin breakdown.  Assess skin q shift and prn.     Discharge Planning:  D/c to home with his wife. Pt will need to be intermittent supervision since his wife works during the day. Support from their older children when she is at work.   Team Discussion: Patient post cranioplasty with increased tone left side. Precautions for C-diff; remains incontinent of bowel and bladder. Left UE subluxation with little progression noted.  Patient on target to meet rehab goals: no, currently needs mod assist for stand pivot and sit- stand. Requires min assist for upper body care and max assist for lower body care due to left inattention. Goals for discharge set for min assist overall.  *See Care Plan and progress notes for long and short-term goals.   Revisions to Treatment Plan:  E-stim Toileting protocol  Teaching Needs: Safety, medications, skin care, transfers, toileting,etc  Current Barriers to Discharge: Decreased caregiver support  Possible Resolutions to Barriers: Family education     Medical Summary Current Status: medically stable  Barriers to Discharge: Behavior;Decreased family/caregiver support;Home enviroment access/layout;Incontinence;Insurance for SNF coverage;Medication compliance;Wound care  Barriers to Discharge Comments: Inattention, incontinence, tone/spasticity Possible Resolutions to Barriers/Weekly Focus: Timed toiletting trial, medication adjustments for tone and attention   Continued Need for Acute Rehabilitation Level of Care: The patient requires daily medical management by a physician with specialized training  in physical medicine and rehabilitation for the following reasons: Direction of a multidisciplinary physical rehabilitation program to maximize functional independence : Yes Medical management of patient  stability for increased activity during participation in an intensive rehabilitation regime.: Yes Analysis of laboratory values and/or radiology reports with any subsequent need for medication adjustment and/or medical intervention. : Yes   I attest that I was present, lead the team conference, and concur with the assessment and plan of the team.   Dorien Chihuahua B 02/24/2022, 2:13 PM

## 2022-02-24 NOTE — Progress Notes (Addendum)
Patient ID: Jacob [name], male   DOB: 1976/04/22, 46 y.o.   MRN: [id number]  [id number] spoke with pt wife [name] to provide updates from team conference, and d/c date remains 10/14. SW discussed family edu this Friday and Monday. She will speak with her children and will follow-up with SW about times. She also mentioned being concerned about him having depression, and wanted to ensure he is still on medication. SW confirmed he remains on medication, and will have him set up to meet with neuropsych if he is not on the list already.   SW spoke with Diane Herrington/[employer] [phone]) to request fax number for prior auth form to be signed and returned for [facility].   *SW received message from Clenton Pare reporting she needs an order for home modifications for the bathroom as reported by pt wife. SW will get order to widen doors and change direction of the way shower door opens.   Loralee Pacas, MSW, [place] Office: [phone] Cell: [phone] Fax: [phone]

## 2022-02-25 LAB — GLUCOSE, CAPILLARY
Glucose-Capillary: 219 mg/dL — ABNORMAL HIGH (ref 70–99)
Glucose-Capillary: 107 mg/dL — ABNORMAL HIGH (ref 70–99)
Glucose-Capillary: 147 mg/dL — ABNORMAL HIGH (ref 70–99)

## 2022-02-25 MED ORDER — BACLOFEN 10 MG PO TABS
10.0000 mg | ORAL_TABLET | Freq: Three times a day (TID) | ORAL | Status: DC
  Administered 2022-02-25 – 2022-03-07 (×30): 10 mg via ORAL
  Filled 2022-02-25 (×31): qty 1

## 2022-02-25 MED ORDER — INSULIN GLARGINE-YFGN 100 UNIT/ML ~~LOC~~ SOLN
8.0000 [IU] | Freq: Every day | SUBCUTANEOUS | Status: DC
  Administered 2022-02-26 – 2022-02-27 (×2): 8 [IU] via SUBCUTANEOUS
  Filled 2022-02-25 (×2): qty 0.08

## 2022-02-25 MED ORDER — SENNOSIDES-DOCUSATE SODIUM 8.6-50 MG PO TABS
1.0000 | ORAL_TABLET | Freq: Every day | ORAL | Status: DC
  Administered 2022-02-25 – 2022-02-26 (×2): 1 via ORAL
  Filled 2022-02-25 (×2): qty 1

## 2022-02-25 MED ORDER — METHYLPHENIDATE HCL 5 MG PO TABS
5.0000 mg | ORAL_TABLET | Freq: Two times a day (BID) | ORAL | Status: DC
  Administered 2022-02-25 – 2022-02-26 (×2): 5 mg via ORAL
  Filled 2022-02-25 (×2): qty 1

## 2022-02-25 NOTE — Progress Notes (Signed)
Occupational Therapy Session Note  Patient Details  Name: Jacob [name] MRN: [id number] Date of Birth: 1976-03-29  Today's Date: 02/25/2022 OT Individual Time: 1005-1100 OT Individual Time Calculation (min): 55 min   Today's Date: 02/25/2022 OT Individual Time: 1400-1430 OT Individual Time Calculation (min): 30 min   Short Term Goals: Week 1:  OT Short Term Goal 1 (Week 1): Patient will don pull over shirt with no more than mod assist OT Short Term Goal 1 - Progress (Week 1): Met OT Short Term Goal 2 (Week 1): Patient will bathe face, chest, abdomen, upper thighs and below knees following set up and cueing with min assist OT Short Term Goal 2 - Progress (Week 1): Met OT Short Term Goal 3 (Week 1): Patient will transfer to commode with min assist OT Short Term Goal 3 - Progress (Week 1): Progressing toward goal OT Short Term Goal 4 (Week 1): Patient will complete oral care with min assist OT Short Term Goal 4 - Progress (Week 1): Met OT Short Term Goal 5 (Week 1): Patient will tolerate passive stretch to LUE to 90* of shoulder flexion, and at least 20* of external rotation without significant increase in report of pain. OT Short Term Goal 5 - Progress (Week 1): Progressing toward goal  Skilled Therapeutic Interventions/Progress Updates:    Session 1: Pt received in bed with no pain, but fatigue  ADL: Pt completes ADL at overall MOD-MAX Level. Skilled interventions include: total A for peri care of incontinent BM and bladder in brief. Pt completely saturated and aware but did not call for assistance until OT suggested getting up. Edu re calling and not sitting in soiled brief. Pt with no evidence of learning. Pt sup>sit with MOD A for LLE and partial trunk management. Seated EOB pt initially reliant on rails for sitting balance. MOD A to don shirt EOB with hemi techniques and MAX A for LB clothing. MOD A to power up sit to stand with heavy reliance on weight shift R in stance. MINA  squat  pivot to w/c    Therapeutic activity Focus of remainder of sessionon WB into L forearm seated and LLE in stance to improve deep proprioceptive input and NMR. Pt seated with forearm on box with bumpy disk underneath for sensory input reaching with RUE to obtain bean bag in far L and placing it at box on R. OT facilaites aproxiation of shoulder joint to reduce sublux and potential impingement.   Standing with sling on and use of hemi walker on R with facilitation of LLEin stance with small forwards/backwards steps of RLE  Pt left at end of session in bed with exit alarm on, call light in reach and all needs met  Session 2:  Pt received in recliner with no pain requesting to go outside.  Therapeutic activity Total A transport to/from outside courtyard. Discussed community mobility options and taking wife out for dinner for birthday and considerations for being out in public with accessibility options. Pt completes w/c mobility around courtyard with min-MOD A for turns to also work on L attention and steering around obstacles.   Pt left at end of session in w/c with exit alarm on, call light in reach and all needs met   Therapy Documentation Precautions:  Precautions Precautions: Fall Precaution Comments: L hemiplegia; painful LUE/shoulder; Required Braces or Orthoses: Splint/Cast Splint/Cast: WHO - Resting hand splint at night Restrictions Weight Bearing Restrictions: No General:   Therapy/Group: Individual Therapy  Tonny Branch 02/25/2022, 6:43  AM

## 2022-02-25 NOTE — Progress Notes (Signed)
Physical Therapy Session Note  Patient Details  Name: Jacob [name] MRN: [id number] Date of Birth: May 19, 1976  Today's Date: 02/25/2022 PT Individual Time: 1520-1630 PT Individual Time Calculation (min): 70 min   Short Term Goals: Week 2:  PT Short Term Goal 1 (Week 2): Pt will perform bed mobility with minA consistently. PT Short Term Goal 2 (Week 2): Pt will perform basic transfers with minA >50% of time. PT Short Term Goal 3 (Week 2): Pt will ambulate 100' with LRAD and modA.  Skilled Therapeutic Interventions/Progress Updates:     Pt received seated in recliner and agrees to therapy. No complaint of pain. Pt performs sit to stand with modA and cues for body mechanics, and stand pivot transfer to bed with modA and cues for sequencing. Sit to supine with modA and management of trunk and L lower extremity. Pt brief checked to ensure cleanliness. Pt's brief is dry. Pt returns to sitting with use of bed features and cues for sequencing and positioning. Squat pivot transfer to [facility] on the R with minA/modA to facilitate anterior weight shift, and pt demonstrating good use of head-hips relationship and body mechanics. WC transport outside for time management. Pt performs sit to stand from [facility] with hemiwalker and minA. (multiple times throughout session), with cues for hand placement, anterior weight shift, initiation, and sequencing. Pt ambulates x15' including 180 degree turn with hemiwalker and modA, with PT providing >75% of work to progress L lower extremity. PT provides manual blocking of L knee during stance phase and provides verbal cues to "step through" with R lower extremity to promote optimal gait mechanics and increase stance time on L. Following seated rest break, pt ambulates x25' with similar cueing and assist levels, and pt demonstrating improving use of hemiwalker. Following rest break, pt ambulates final bout of 50', including multiple turns and ambulating over cement surface with slight  grade to challenge dynamic balance. Pt able to initiate progression of L lower extremity when gait mechanics are optimized, but typically requires near complete assistance to achieve functional step length on the L. WC transport back inside. Pt performs ambulatory transfer to recliner with hemiwalker and similar cueing. Left seated with all needs within reach.  Therapy Documentation Precautions:  Precautions Precautions: Fall Precaution Comments: L hemiplegia; painful LUE/shoulder; Required Braces or Orthoses: Splint/Cast Splint/Cast: WHO - Resting hand splint at night Restrictions Weight Bearing Restrictions: No  Therapy/Group: Individual Therapy  Breck Coons, PT, DPT 02/25/2022, 4:36 PM

## 2022-02-25 NOTE — Progress Notes (Signed)
Speech Language Pathology Daily Session Note  Patient Details  Name: MALACAI GRANTZ MRN: [id number] Date of Birth: Oct 04, 1975  Today's Date: 02/25/2022 SLP Individual Time: 0915-1000 SLP Individual Time Calculation (min): 45 min  Short Term Goals: Week 2: SLP Short Term Goal 1 (Week 2): Pt will participate in functional and therapeutic recall activities targeting short-term and working memory with 80% accuracy given Mod A multimodal cues. SLP Short Term Goal 2 (Week 2): Pt will attend to functional and therapeutic tasks for 15 minute intervals with Mod verbal cues for redirection and/or to maintain alertness. SLP Short Term Goal 3 (Week 2): Pt will complete basic problem-solving tasks with 80% accuracy given Min A vebral and visual cues. SLP Short Term Goal 4 (Week 2): Pt will utilize visual scanning strategies to participate in functional vision tasks to include reading with 100% accuracy given Min A verbal and visual prompts. SLP Short Term Goal 5 (Week 2): Pt will tolerate current diet of regular textures and thin liquids with no clinical s/sx concerning for airway invasion given supervision verbal cues for adherence to aspiration precautions.  Skilled Therapeutic Interventions:Skilled ST services focused on swallow and cognitive skills. Pt consumed regular textures and thin liquids via straw snack with mild left pocketing of solid, but ability to clear oral cavity mod I with no overt s/s aspiration. SLP facilitated left scanning, sustained attention, basic problem solving and recall in cancellation, adding/subtracting and organization task with written directions. Pt required mod A fade to min a verbal cues in cancellation task reducing errors from 5 missed letters to 3 missed letter, primarily on the left. Pt demonstrated ability to add/subtract double digit numbers with supervision A verbal cues for error awareness. Pt required mod A verbal cues for left scanning and mod-min A verbal cues for  problem solving/error awareness in organizational task (planning garden plot) with step by step directions. Sustained attention for 15 minute intervals with mod A verbal cues for redirection. Pt was left in room with call bell within reach and bed alarm set. SLP recommends to continue skilled services.  Pain Pain Assessment Pain Score: 0-No pain  Therapy/Group: Individual Therapy  Symeon Puleo  [facility] 02/25/2022, 12:24 PM

## 2022-02-25 NOTE — Progress Notes (Signed)
PROGRESS NOTE   Subjective/Complaints:  Patient seen at bedside. Inquiring if discharge date can be moved up. Denies any depression, anxiety, poor sleep. Wife expressed concern to SW regarding worsening depression.   Per teams yesterday, therapies concerned patient is plateauing in progress, with ongoing difficulty in attention.   NO events overnight. No other complaints.    ROS: Patient denies fever, rash, sore throat, blurred vision, dizziness, nausea, vomiting, diarrhea, cough, shortness of breath or chest pain, joint or back/neck pain,   or mood change.    Objective:   No results found. No results for input(s): "WBC", "HGB", "HCT", "PLT" in the last 72 hours.  No results for input(s): "NA", "K", "CL", "CO2", "GLUCOSE", "BUN", "CREATININE", "CALCIUM" in the last 72 hours.   Intake/Output Summary (Last 24 hours) at 02/25/2022 1018 Last data filed at 02/25/2022 0845 Gross per 24 hour  Intake 708 ml  Output --  Net 708 ml         Physical Exam: Vital Signs Blood pressure (!) 134/94, pulse 61, temperature 98.2 F (36.8 C), temperature source Oral, resp. rate 16, height 6\' 1"  (1.854 m), weight 81 kg, SpO2 100 %.  General: Alert and oriented x 3, No apparent distress HEENT: Large cranioplasty w/out swelling, stable.  +Strabismus, stable. sclera anicteric, oral mucosa pink and moist Neck: Supple without JVD or lymphadenopathy Heart: Reg rate and rhythm. No murmurs rubs or gallops Chest: CTA bilaterally without wheezes, rales, or rhonchi; no distress. Good air movement Abdomen: Soft, non-tender, non-distended, bowel sounds positive. Extremities: No clubbing, cyanosis, or edema.  Psych: Pt's affect is appropriate. Pt is cooperative Skin: Crani incision and abdominal incisions c/d/I, staples s/p removal.  Neuro:  pt is alert and orientedx3. Mild difficulty processing. Left central 7, mild dysarthria. L neglect, improved.   Tone: LUE contractures in finger flexors, MAS 3 wrist flexors, MAS 2 eblow flexors, MAS 2 elbow extensors LLE MAS 2 knee flexors, MAS 2 hip flexors, MAS 2 hip adductors, MAS 1 ankle plantarflexors Relfexes: 2-3 beats clonus L ankle Musculoskeletal: shoulder subluxation 1.5 fingerbreadths and TTP throughout LUE.    Assessment/Plan: 1. Functional deficits which require 3+ hours per day of interdisciplinary therapy in a comprehensive inpatient rehab setting. Physiatrist is providing close team supervision and 24 hour management of active medical problems listed below. Physiatrist and rehab team continue to assess barriers to discharge/monitor patient progress toward functional and medical goals  Care Tool:  Bathing        Body parts bathed by helper: Right arm, Left arm, Chest, Abdomen, Front perineal area, Buttocks, Left lower leg, Right lower leg, Left upper leg, Right upper leg, Face     Bathing assist Assist Level: Dependent - Patient 0%     Upper Body Dressing/Undressing Upper body dressing   What is the patient wearing?: Pull over shirt    Upper body assist Assist Level: Maximal Assistance - Patient 25 - 49%    Lower Body Dressing/Undressing Lower body dressing      What is the patient wearing?: Pants, Incontinence brief     Lower body assist Assist for lower body dressing: Total Assistance - Patient < 25%     Toileting  Toileting Toileting Activity did not occur Press photographer and hygiene only): N/A (no void or bm)  Toileting assist Assist for toileting: 2 Helpers     Transfers Chair/bed transfer  Transfers assist     Chair/bed transfer assist level: Moderate Assistance - Patient 50 - 74%     Locomotion Ambulation   Ambulation assist      Assist level: Maximal Assistance - Patient 25 - 49% Assistive device: Walker-rolling Max distance: 2 feet   Walk 10 feet activity   Assist  Walk 10 feet activity did not occur: Safety/medical  concerns        Walk 50 feet activity   Assist Walk 50 feet with 2 turns activity did not occur: Safety/medical concerns         Walk 150 feet activity   Assist Walk 150 feet activity did not occur: Safety/medical concerns         Walk 10 feet on uneven surface  activity   Assist Walk 10 feet on uneven surfaces activity did not occur: Safety/medical concerns         Wheelchair     Assist Is the patient using a wheelchair?: Yes Type of Wheelchair: Manual    Wheelchair assist level: Supervision/Verbal cueing Max wheelchair distance: 106 ft    Wheelchair 50 feet with 2 turns activity    Assist        Assist Level: Supervision/Verbal cueing   Wheelchair 150 feet activity     Assist      Assist Level: Moderate Assistance - Patient 50 - 74%   Blood pressure (!) 134/94, pulse 61, temperature 98.2 F (36.8 C), temperature source Oral, resp. rate 16, height 6\' 1"  (1.854 m), weight 81 kg, SpO2 100 %.  Medical Problem List and Plan: 1. Functional deficits secondary to debility after cranioplasty and C-Diff in setting of prior right MCA stroke, left hemiplegia             -patient may not yet shower             -ELOS/Goals: 12-14 days, mod assist with PT, OT, SLP             -pt has a resting WHO for LUE - 10/3 add washcloth roll if poor WHO tolerance             - PRAFO to maintain heel cord stretch  -Patient is beginning CIR therapies today including PT, OT, and SLP   2.  Antithrombotics: -DVT/anticoagulation:  Pharmaceutical: Eliquis             -antiplatelet therapy: none 3. Pain Management: Tylenol, oxycodone, hydrocodone, Robaxin as needed - significant pain from shoulder subluxation.              - 9/27 schedule Tylenol 1000 mg TID for pain management + Change pain regimen to oxycodone 2.5/5 mg PRN for moderate/severe pain; DC hydrocodone, DC robaxin              - For L shoulder sublux: Order OT taping and ROM, sling when OOB and not  mobilizing shoulder, pillow support in bed 9/27 - improved 9/28               - 10/4 DC PRN Oxycodone due to no use in several days               - add baclofen 10 mg BID for worsening LUE and LLE tone; monitor for cognitive effects -> inc 10 TID 10/4  4. Mood/Behavior/Sleep: LCSW to evaluate and provide emotional support             -antipsychotic agents: n/a             -depression: continue Lexapro 20 mg daily             -insomnia: continue melatonin; DC trazadone d/t no use             - Adding ritalin 5 mg BID to improve processing speed and attention 10/4 5. Neuropsych/cognition: This patient is capable of making decisions on his own behalf. 6. Skin/Wound Care: routine skin care checks             -monitor scalp and abdominal surgical incisions - staples removal 9/29 completed without complication - healing well   7. Fluids/Electrolytes/Nutrition:               -carb modified diet  -can dc IVF  9/24-Potassium 3.0--add supplement - 3.2 9/27-- inc supplement to 20 meq BID  - BMP 9/30 improved - stable 10/1    -BUN/Cr wnl    -encourage PO    Latest Ref Rng & Units 02/22/2022    6:22 AM 02/21/2022    7:10 AM 02/18/2022    7:35 AM  BMP  Glucose 70 - 99 mg/dL 948  546  270   BUN 6 - 20 mg/dL 8  8  6    Creatinine 0.61 - 1.24 mg/dL  3.50  0.93   Sodium 135 - 145 mmol/L 138  140  142   Potassium 3.5 - 5.1 mmol/L 3.9  3.9  3.2   Chloride 98 - 111 mmol/L 104  104  103   CO2 22 - 32 mmol/L 27  28  32   Calcium 8.9 - 10.3 mg/dL 9.4  9.2  9.4      8: DM: Hgb A1c = 8.1; on SSI             -consider restarting home Lantus and Novolog    -9/24 fair control. Hold scheduled insulin for now            - 9/26 - rare PM highs >200 ~2100; otherwise well controlled             - 9/29 - consistent use of SSI 3-9 units with increased needs at night; will start Lantus 3U daily 9/30            - 10/2 Increase Lantus to 5U daily -> 8U daily 10/4        Recent Labs    02/24/22 1212  02/24/22 1638 02/24/22 2058  GLUCAP 208* 119* 226*       9: Hypertension: monitor             -continue clonidine patch 0.3 mg weekly             -continue amlodipine 10 mg daily             -continue Lopressor 25 mg BID             -continue lisinopril 40 mg daily             - BP well controlled ~120s; monitor    02/25/2022    4:20 AM 02/24/2022    7:21 PM 02/24/2022   12:42 PM  Vitals with BMI  Systolic 134 124 04/26/2022  Diastolic 94 77 81  Pulse 61 66 71     10: C. diff diarrhea: continue  oral vanc, end 9/30; discontinue Colace for now             - Now daily BM, formed             - Per infection control , continue PO vanc for duration, precautions must remain for 30 days.              - 10/1 - inquiring with infection control on grounds pass once finishes with abx - approved  -10/2 Reports stool more firm yesterday            - 10/4 hard Bms; adding Sennakot 1 tab nightly  11. Incontinence of bowel and bladder             - 10/4 pt reports sensate for both, can't hold; initiate timed toiletting Q4H  LOS: 11 days A FACE TO FACE EVALUATION WAS PERFORMED  Gertie Gowda 02/25/2022, 10:18 AM

## 2022-02-26 LAB — GLUCOSE, CAPILLARY
Glucose-Capillary: 132 mg/dL — ABNORMAL HIGH (ref 70–99)
Glucose-Capillary: 223 mg/dL — ABNORMAL HIGH (ref 70–99)
Glucose-Capillary: 175 mg/dL — ABNORMAL HIGH (ref 70–99)
Glucose-Capillary: 207 mg/dL — ABNORMAL HIGH (ref 70–99)

## 2022-02-26 MED ORDER — METHYLPHENIDATE HCL 5 MG PO TABS
2.5000 mg | ORAL_TABLET | Freq: Two times a day (BID) | ORAL | Status: DC
  Administered 2022-02-26 – 2022-02-27 (×2): 2.5 mg via ORAL
  Filled 2022-02-26 (×2): qty 1

## 2022-02-26 MED ORDER — LIDOCAINE-PRILOCAINE 2.5-2.5 % EX CREA
TOPICAL_CREAM | Freq: Every day | CUTANEOUS | Status: DC | PRN
  Filled 2022-02-26: qty 5

## 2022-02-26 NOTE — Progress Notes (Signed)
PROGRESS NOTE   Subjective/Complaints:  No events overnight. C/o scalp itching over his surgical site today, ongoing.   Pt states his L arms is feeling better after increasing baclofen. He does endorse overall some fatigue but not intolerable.   Today, he state he is eager to get back to driving. He states his plan is to drive around his neighborhood when he gets home to "see how it goes driving with one arm." Explained to patient the extent of his deficits are cogntive as well, which will effect reaction speed and would make it unsafe to test driving on his own. Offered driving therapy/evaluation as OP, pt agreeable to this "if it's under $30." Will give information at discharge.   Discussed with therapies, who feel while patient has been more verbose and perseverative with ritalin, he also has been more participatory and making better therapy gains since starting medication.   ROS: Patient denies fever, rash, sore throat, blurred vision, dizziness, nausea, vomiting, diarrhea, cough, shortness of breath or chest pain, joint or back/neck pain,   or mood change.    Objective:   No results found. No results for input(s): "WBC", "HGB", "HCT", "PLT" in the last 72 hours.  No results for input(s): "NA", "K", "CL", "CO2", "GLUCOSE", "BUN", "CREATININE", "CALCIUM" in the last 72 hours.   Intake/Output Summary (Last 24 hours) at 02/26/2022 1127 Last data filed at 02/26/2022 0911 Gross per 24 hour  Intake 560 ml  Output --  Net 560 ml         Physical Exam: Vital Signs Blood pressure 121/81, pulse 71, temperature 98.5 F (36.9 C), resp. rate 18, height 6\' 1"  (1.854 m), weight 81 kg, SpO2 100 %.  General: Alert and oriented x 3, No apparent distress HEENT: Large cranioplasty w/out swelling, stable.  +Strabismus, stable. sclera anicteric, oral mucosa pink and moist Neck: Supple without JVD or lymphadenopathy Heart: Reg rate and  rhythm. No murmurs rubs or gallops Chest: CTA bilaterally without wheezes, rales, or rhonchi; no distress. Good air movement Abdomen: Soft, non-tender, non-distended, bowel sounds positive. Extremities: No clubbing, cyanosis, or edema.  Psych: Pt's affect is appropriate. Pt is cooperative Skin: Crani incision and abdominal incisions c/d/I, staples s/p removal. Mostly scarred with a few scabbed areas left. No erythema or rashes.   Neuro:  pt is alert and orientedx3. Left central 7, mild dysarthria. L neglect, improved.  + Verbose, perseverative, and mildly elevated - new from prior exams. No apparent impulsivity.  Insight: Poor insight into condition.  Tone: LUE contractures in finger flexors, MAS 2 wrist flexors, MAS 2 eblow flexors, MAS 2 elbow extensors LLE MAS 1+ knee flexors, MAS 1+ hip flexors, MAS 1+ hip adductors, MAS 1 ankle plantarflexors Reflexes: 2-3 beats clonus L ankle Musculoskeletal: shoulder subluxation 1.5 fingerbreadths and TTP throughout LUE. +sling   Assessment/Plan: 1. Functional deficits which require 3+ hours per day of interdisciplinary therapy in a comprehensive inpatient rehab setting. Physiatrist is providing close team supervision and 24 hour management of active medical problems listed below. Physiatrist and rehab team continue to assess barriers to discharge/monitor patient progress toward functional and medical goals  Care Tool:  Bathing  Body parts bathed by helper: Right arm, Left arm, Chest, Abdomen, Front perineal area, Buttocks, Left lower leg, Right lower leg, Left upper leg, Right upper leg, Face     Bathing assist Assist Level: Dependent - Patient 0%     Upper Body Dressing/Undressing Upper body dressing   What is the patient wearing?: Pull over shirt    Upper body assist Assist Level: Maximal Assistance - Patient 25 - 49%    Lower Body Dressing/Undressing Lower body dressing      What is the patient wearing?: Pants, Incontinence  brief     Lower body assist Assist for lower body dressing: Total Assistance - Patient < 25%     Toileting Toileting Toileting Activity did not occur Landscape architect and hygiene only): N/A (no void or bm)  Toileting assist Assist for toileting: 2 Helpers     Transfers Chair/bed transfer  Transfers assist     Chair/bed transfer assist level: Moderate Assistance - Patient 50 - 74%     Locomotion Ambulation   Ambulation assist      Assist level: Maximal Assistance - Patient 25 - 49% Assistive device: Walker-rolling Max distance: 2 feet   Walk 10 feet activity   Assist  Walk 10 feet activity did not occur: Safety/medical concerns        Walk 50 feet activity   Assist Walk 50 feet with 2 turns activity did not occur: Safety/medical concerns         Walk 150 feet activity   Assist Walk 150 feet activity did not occur: Safety/medical concerns         Walk 10 feet on uneven surface  activity   Assist Walk 10 feet on uneven surfaces activity did not occur: Safety/medical concerns         Wheelchair     Assist Is the patient using a wheelchair?: Yes Type of Wheelchair: Manual    Wheelchair assist level: Supervision/Verbal cueing Max wheelchair distance: 106 ft    Wheelchair 50 feet with 2 turns activity    Assist        Assist Level: Supervision/Verbal cueing   Wheelchair 150 feet activity     Assist      Assist Level: Moderate Assistance - Patient 50 - 74%   Blood pressure 121/81, pulse 71, temperature 98.5 F (36.9 C), resp. rate 18, height 6\' 1"  (1.854 m), weight 81 kg, SpO2 100 %.  Medical Problem List and Plan: 1. Functional deficits secondary to debility after cranioplasty and C-Diff in setting of prior right MCA stroke, left hemiplegia             -patient may not yet shower             -ELOS/Goals: 12-14 days, mod assist with PT, OT, SLP             -pt has a resting WHO for LUE - 10/3 add washcloth  roll if poor WHO tolerance             - PRAFO to maintain heel cord stretch  -  CIR therapies ongoing including PT, OT, and SLP   2.  Antithrombotics: -DVT/anticoagulation:  Pharmaceutical: Eliquis             -antiplatelet therapy: none 3. Pain Management: Tylenol, oxycodone, hydrocodone, Robaxin as needed - significant pain from shoulder subluxation.              - 9/27 schedule Tylenol 1000 mg TID for pain management +  Change pain regimen to oxycodone 2.5/5 mg PRN for moderate/severe pain; DC hydrocodone, DC robaxin              - For L shoulder sublux: Order OT taping and ROM, sling when OOB and not mobilizing shoulder, pillow support in bed 9/27 - improved 9/28               - 10/4 DC PRN Oxycodone due to no use in several days               - add baclofen 10 mg BID for worsening LUE and LLE tone; monitor for cognitive effects -> inc 10 TID 10/4 - improved  4. Mood/Behavior/Sleep: LCSW to evaluate and provide emotional support             -antipsychotic agents: n/a             -depression: continue Lexapro 20 mg daily             -insomnia: continue melatonin; DC trazadone d/t no use             - Adding ritalin 5 mg BID to improve processing speed and attention 10/4 -> decrease to 2.5 BID for verbose, perseverative 10/5 - can switch to amantidine if needed  5. Neuropsych/cognition: This patient is capable of making decisions on his own behalf. 6. Skin/Wound Care: routine skin care checks             -monitor scalp and abdominal surgical incisions - staples removal 9/29 completed without complication - healing well   7. Fluids/Electrolytes/Nutrition:               -carb modified diet  -can dc IVF  9/24-Potassium 3.0--add supplement - 3.2 9/27-- inc supplement to 20 meq BID  - BMP 9/30 improved - stable 10/1    -BUN/Cr wnl    -encourage PO    Latest Ref Rng & Units 02/22/2022    6:22 AM 02/21/2022    7:10 AM 02/18/2022    7:35 AM  BMP  Glucose 70 - 99 mg/dL 063  016  010   BUN 6  - 20 mg/dL 8  8  6    Creatinine 0.61 - 1.24 mg/dL  9.32  3.55   Sodium 135 - 145 mmol/L 138  140  142   Potassium 3.5 - 5.1 mmol/L 3.9  3.9  3.2   Chloride 98 - 111 mmol/L 104  104  103   CO2 22 - 32 mmol/L 27  28  32   Calcium 8.9 - 10.3 mg/dL 9.4  9.2  9.4      8: DM: Hgb A1c = 8.1; on SSI             -consider restarting home Lantus and Novolog    -9/24 fair control. Hold scheduled insulin for now            - 9/26 - rare PM highs >200 ~2100; otherwise well controlled             - 9/29 - consistent use of SSI 3-9 units with increased needs at night; will start Lantus 3U daily 9/30            - 10/2 Increase Lantus to 5U daily -> 8U daily 10/4            - 10/4 <200; no lows; monitor Recent Labs    02/25/22 1701 02/25/22 2105 02/26/22 0616  GLUCAP 107* 147* 132*  9: Hypertension: monitor             -continue clonidine patch 0.3 mg weekly             -continue amlodipine 10 mg daily             -continue Lopressor 25 mg BID             -continue lisinopril 40 mg daily             - BP well controlled ~120s; monitor    02/26/2022    8:00 AM 02/26/2022    3:08 AM 02/25/2022    7:40 PM  Vitals with BMI  Systolic 121 154 700  Diastolic 81 92 74  Pulse 71 63 66     10: C. diff diarrhea: continue oral vanc, end 9/30; discontinue Colace for now             - Now daily BM, formed             - Per infection control , continue PO vanc for duration, precautions must remain for 30 days.              - 10/1 - inquiring with infection control on grounds pass once finishes with abx - approved  -10/2 Reports stool more firm yesterday            - 10/4 hard Bms; adding Sennakot 1 tab nightly  11. Incontinence of bowel and bladder - ongoing             - 10/4 pt reports sensate for both, can't hold; initiate timed toiletting Q2H  LOS: 12 days A FACE TO FACE EVALUATION WAS PERFORMED  Angelina Sheriff 02/26/2022, 11:27 AM

## 2022-02-26 NOTE — Progress Notes (Signed)
Physical Therapy Session Note  Patient Details  Name: Jacob [name] MRN: [id number] Date of Birth: [birth year]  Today's Date: 02/26/2022 PT Individual Time: 0930-1000 PT Individual Time Calculation (min): 30 min   Short Term Goals: Week 1:  PT Short Term Goal 1 (Week 1): Patient will perform bed mobility with min A consistently. PT Short Term Goal 1 - Progress (Week 1): Progressing toward goal PT Short Term Goal 2 (Week 1): Patient will perform basic transfers with min A >50% of the time. PT Short Term Goal 2 - Progress (Week 1): Progressing toward goal PT Short Term Goal 3 (Week 1): Patient will ambulate >100 ft using LRAD with mod A. PT Short Term Goal 3 - Progress (Week 1): Progressing toward goal  Skilled Therapeutic Interventions/Progress Updates:     Patient in bed with his wife in the room upon PT arrival. Patient alert and agreeable to PT session. Patient denied pain during session. Discussed d/c planning and scheduled family education with patient's wife for next Tuesday from 9am-12pm.   Patient verbose throughout session with poor attention to tasks. Patient incontinent of bowel and bladder at beginning of session. Patient reports he is aware when he needs to be toileted, but reports urgency leads to incontinence. Patient states he no longer calls for assist due to "wait times." Spent increased time educating patient on consequences of incontinence including risk of skin breakdown and infection. Patient stated understanding and agreeable to call for assist for toileting ad at least for peri-care with incontinence.   Therapeutic Activity: Bed Mobility: Patient performed rolling R/L with min-mod A with use of bed rails. Required max cues and facilitation for L hemi-body. Peri-care and lower body clothing management with total A. Patient's wife participated in education for bed level toileting management throughout.  Patient in bed with his wife at bedside at end of session with  breaks locked, bed alarm set, 4 rails up per patient request, and all needs within reach.   Therapy Documentation Precautions:  Precautions Precautions: Fall Precaution Comments: L hemiplegia; painful LUE/shoulder; Required Braces or Orthoses: Splint/Cast Splint/Cast: WHO - Resting hand splint at night Restrictions Weight Bearing Restrictions: No   Therapy/Group: Individual Therapy  Josia Cueva L Lenell Mcconnell PT, DPT, NCS, CBIS  02/26/2022, 12:51 PM

## 2022-02-26 NOTE — Progress Notes (Signed)
Speech Language Pathology Daily Session Note  Patient Details  Name: Jacob [name] MRN: [id number] Date of Birth: 27-May-1975  Today's Date: 02/26/2022 SLP Individual Time: 1002-1058 SLP Individual Time Calculation (min): 56 min  Short Term Goals: Week 2: SLP Short Term Goal 1 (Week 2): Pt will participate in functional and therapeutic recall activities targeting short-term and working memory with 80% accuracy given Mod A multimodal cues. SLP Short Term Goal 2 (Week 2): Pt will attend to functional and therapeutic tasks for 15 minute intervals with Mod verbal cues for redirection and/or to maintain alertness. SLP Short Term Goal 3 (Week 2): Pt will complete basic problem-solving tasks with 80% accuracy given Min A vebral and visual cues. SLP Short Term Goal 4 (Week 2): Pt will utilize visual scanning strategies to participate in functional vision tasks to include reading with 100% accuracy given Min A verbal and visual prompts. SLP Short Term Goal 5 (Week 2): Pt will tolerate current diet of regular textures and thin liquids with no clinical s/sx concerning for airway invasion given supervision verbal cues for adherence to aspiration precautions.  Skilled Therapeutic Interventions: Skilled treatment session focused on cognitive goals. Upon arrival, patient was awake in bed. Patient appeared brighter today but was extremely verbose throughout session. Patient was also impulsive with transfers and when attempting to complete any functional task requiring overall Mod A verbal cues. Patient requested to go outside and completed a visual scanning/attention task of reading and writing appointments on a calendar with Min verbal cues for problem solving and error awareness throughout task. Patient transferred to the recliner at end of session and left with alarm on and all needs within reach. Continue with current plan of care.      Pain No/Denies Pain   Therapy/Group: Individual Therapy  Elvi Leventhal,  Kaisyn Reinhold 02/26/2022, 3:23 PM

## 2022-02-26 NOTE — Progress Notes (Signed)
Physical Therapy Session Note  Patient Details  Name: Jacob [name] MRN: [id number] Date of Birth: [birth year]  Today's Date: 02/26/2022 PT Individual Time: 1515-1600 PT Individual Time Calculation (min): 45 min   Short Term Goals: Week 2:  PT Short Term Goal 1 (Week 2): Pt will perform bed mobility with minA consistently. PT Short Term Goal 2 (Week 2): Pt will perform basic transfers with minA >50% of time. PT Short Term Goal 3 (Week 2): Pt will ambulate 100' with LRAD and modA.  Skilled Therapeutic Interventions/Progress Updates:     Pt received seated in recliner and agrees to therapy. No complaint of pain. Sit to stand with hemiwalker and modA from recliner. Pt noted to be soiled of bowel. Pt performs stand step transfer to bed with hemiwalker and modA, with cues for AD management, step sequencing, and positioning. Sit to supine with modA to facilitate trunk control and cues for body mechanics. NT arrives to assist pt with pericare and brief change. Pt performs sit to stand from bed with minA and hemiwalker, with cues for and placement and sequencing. Stand step transfer to [facility] with modA and cues for positioning and hand placement. WC transport to gym for time management. Pt performs sit to stand with hemiwalker and minA, with same cueing. Pt ambulates x50' with modA overall, with PT facilitating L lower extremity swing phase, as well as blocking of L knee durin gstance phase. Cues provided for AD management and sequencing. Following ambulation, pt reports feeling very fatigued from walk, saying it felt like he was "fighting left foot". WC transport back to room. Pt performs stand step transfer back to bed with modA and cues for sequencing. Left seated with all needs within reach.  Therapy Documentation Precautions:  Precautions Precautions: Fall Precaution Comments: L hemiplegia; painful LUE/shoulder; Required Braces or Orthoses: Splint/Cast Splint/Cast: WHO - Resting hand splint at  night Restrictions Weight Bearing Restrictions: No    Therapy/Group: Individual Therapy  Breck Coons, PT, DPT 02/26/2022, 4:08 PM

## 2022-02-26 NOTE — Progress Notes (Addendum)
Patient ID: Jacob [name], male   DOB: 1976-05-15, 46 y.o.   MRN: [id number]  SW faxed home modification order and PCS referral from to [facility] [phone]).  Fam edu scheduled for Tuesday (10/10) 9am-12pm with pt wife.   Loralee Pacas, MSW, [place] Office: [phone] Cell: [phone] Fax: [phone]

## 2022-02-26 NOTE — Progress Notes (Signed)
Occupational Therapy Session Note  Patient Details  Name: Jacob [name] MRN: [id number] Date of Birth: [birth year]  Today's Date: 02/26/2022 OT Individual Time: 4932-4199 OT Individual Time Calculation (min): 73 min   Short Term Goals: Week 2:  OT Short Term Goal 1 (Week 2): Patient will transfer to commode with min assist OT Short Term Goal 2 (Week 2): Patient will tolerate passive stretch to LUE to 90* of shoulder flexion, and at least 20* of external rotation without significant increase in report of pain. OT Short Term Goal 3 (Week 2): Patient will demonstrate improved attention to L side by locating 2 items on L side of the sink with min questioning cues.  Skilled Therapeutic Interventions/Progress Updates:    Pt greeted seated in recliner and agreeable to OT treatment session. Pt hyperverbal and perseverating on someone not coming to see him and wanting to "beat their ass." Pt's friend and minister entered the room. Pt continued to be hyperverbal with him and requiring max cues to maintain attention to task such as hand washing. Pt's friend able to help calm patient using biblical references and calm demeanor. Pt requested to go outside. Pt brought outside in wc with focus on head turns and locating items in hallway on L side. Pt with strong R gaze preference even when person he is talking to is on the L side. Pt required multimodal cues to stay on task outside in more distracting environment. OT placed fish cards out to complete matching game. Attempted this in standing 3x with pt able to stand with min A, but then unable to attend to task at hand. OT decreased task demand to complete in sitting with more success but still moderate cues to locate cards on L side of table. Gentle stretching and ROM to L UE. Pt reported L UE was very painful and did not want OT assisting with PROM stretches. OT educated pt on self ROM techniques in which he demonstrated understanding, but needed cues to complete  full set. Focus on elbow extension, shoulder flexion, and supination/pronation. PT returned to room and pivoted back to recliner with min A. Pt left sea\ted in recliner with alarm belt on, call bell in reach, and needs met.   Therapy Documentation Precautions:  Precautions Precautions: Fall Precaution Comments: L hemiplegia; painful LUE/shoulder; Required Braces or Orthoses: Splint/Cast Splint/Cast: WHO - Resting hand splint at night Restrictions Weight Bearing Restrictions: No  Pain: Pt reports pain in L UE when ranging. Rest and repositioned for comfort.   Therapy/Group: Individual Therapy  Valma Cava 02/26/2022, 2:27 PM

## 2022-02-27 LAB — GLUCOSE, CAPILLARY
Glucose-Capillary: 171 mg/dL — ABNORMAL HIGH (ref 70–99)
Glucose-Capillary: 166 mg/dL — ABNORMAL HIGH (ref 70–99)
Glucose-Capillary: 128 mg/dL — ABNORMAL HIGH (ref 70–99)
Glucose-Capillary: 212 mg/dL — ABNORMAL HIGH (ref 70–99)

## 2022-02-27 MED ORDER — INSULIN GLARGINE-YFGN 100 UNIT/ML ~~LOC~~ SOLN
10.0000 [IU] | Freq: Every day | SUBCUTANEOUS | Status: DC
  Administered 2022-02-28 – 2022-03-07 (×8): 10 [IU] via SUBCUTANEOUS
  Filled 2022-02-27 (×8): qty 0.1

## 2022-02-27 NOTE — Progress Notes (Signed)
PROGRESS NOTE   Subjective/Complaints:  Request for urgent call to wife this AM; she expresses concern over recent behaviors, patient calling her continuously overnight, acting somewhat manic. Discussed behaviors corresponding with recent trial of ritalin; wife upset regarding medication changes without her express permission. Discussed how in the future, medication changes are not to be made without discussing with her first.   Patient seen at bedside with wife this afternoon. Reiterated prior discussion and addressed concerns, talked about initiation of baclofen for tone. Patient denies refusing PRAFO, WHO overnight. Also states he is not getting OOB for timed toiletting. Otherwise, no concerns, complaints.   ROS: Patient denies fever, rash, sore throat, blurred vision, dizziness, nausea, vomiting, diarrhea, cough, shortness of breath or chest pain, joint or back/neck pain.   Objective:   No results found. No results for input(s): "WBC", "HGB", "HCT", "PLT" in the last 72 hours.  No results for input(s): "NA", "K", "CL", "CO2", "GLUCOSE", "BUN", "CREATININE", "CALCIUM" in the last 72 hours.   Intake/Output Summary (Last 24 hours) at 02/27/2022 1253 Last data filed at 02/27/2022 0900 Gross per 24 hour  Intake 238 ml  Output 300 ml  Net -62 ml         Physical Exam: Vital Signs Blood pressure 114/76, pulse 66, temperature 98.1 F (36.7 C), temperature source Oral, resp. rate 16, height 6\' 1"  (1.854 m), weight 81 kg, SpO2 100 %.  General: Alert and oriented x 3, No apparent distress HEENT: Large cranioplasty w/out swelling, stable.  +Strabismus, stable. sclera anicteric, oral mucosa pink and moist Neck: Supple without JVD or lymphadenopathy Heart: Reg rate and rhythm. No murmurs rubs or gallops Chest: CTA bilaterally without wheezes, rales, or rhonchi; no distress. Good air movement Abdomen: Soft, non-tender,  non-distended, bowel sounds positive. Extremities: No clubbing, cyanosis, or edema.  Psych: Pt's affect is appropriate. Pt is cooperative Skin: Crani incision and abdominal incisions c/d/I, staples s/p removal. Mostly scarred with a few scabbed areas left. No erythema or rashes.   Neuro:  pt is alert and orientedx3. Left central 7, mild dysarthria. L neglect, improved.  + Verbose - less so than prior exam   Insight: Poor insight into condition.  Tone: LUE contractures in finger flexors, MAS 2 wrist flexors, MAS 2 eblow flexors, MAS 2 elbow extensors LLE MAS 1+ knee flexors, MAS 1+ hip flexors, MAS 1+ hip adductors, MAS 1 ankle plantarflexors Reflexes: 2-3 beats clonus L ankle Musculoskeletal: shoulder subluxation 2 fingerbreadths, no TTP. +sling   Assessment/Plan: 1. Functional deficits which require 3+ hours per day of interdisciplinary therapy in a comprehensive inpatient rehab setting. Physiatrist is providing close team supervision and 24 hour management of active medical problems listed below. Physiatrist and rehab team continue to assess barriers to discharge/monitor patient progress toward functional and medical goals  Care Tool:  Bathing        Body parts bathed by helper: Right arm, Left arm, Chest, Abdomen, Front perineal area, Buttocks, Left lower leg, Right lower leg, Left upper leg, Right upper leg, Face     Bathing assist Assist Level: Dependent - Patient 0%     Upper Body Dressing/Undressing Upper body dressing   What is the  patient wearing?: Pull over shirt    Upper body assist Assist Level: Maximal Assistance - Patient 25 - 49%    Lower Body Dressing/Undressing Lower body dressing      What is the patient wearing?: Pants, Incontinence brief     Lower body assist Assist for lower body dressing: Total Assistance - Patient < 25%     Toileting Toileting Toileting Activity did not occur Landscape architect and hygiene only): Refused  Toileting assist  Assist for toileting: 2 Helpers     Transfers Chair/bed transfer  Transfers assist     Chair/bed transfer assist level: Moderate Assistance - Patient 50 - 74%     Locomotion Ambulation   Ambulation assist      Assist level: Maximal Assistance - Patient 25 - 49% Assistive device: Walker-rolling Max distance: 2 feet   Walk 10 feet activity   Assist  Walk 10 feet activity did not occur: Safety/medical concerns        Walk 50 feet activity   Assist Walk 50 feet with 2 turns activity did not occur: Safety/medical concerns         Walk 150 feet activity   Assist Walk 150 feet activity did not occur: Safety/medical concerns         Walk 10 feet on uneven surface  activity   Assist Walk 10 feet on uneven surfaces activity did not occur: Safety/medical concerns         Wheelchair     Assist Is the patient using a wheelchair?: Yes Type of Wheelchair: Manual    Wheelchair assist level: Supervision/Verbal cueing Max wheelchair distance: 106 ft    Wheelchair 50 feet with 2 turns activity    Assist        Assist Level: Supervision/Verbal cueing   Wheelchair 150 feet activity     Assist      Assist Level: Moderate Assistance - Patient 50 - 74%   Blood pressure 114/76, pulse 66, temperature 98.1 F (36.7 C), temperature source Oral, resp. rate 16, height 6\' 1"  (1.854 m), weight 81 kg, SpO2 100 %.  Medical Problem List and Plan: 1. Functional deficits secondary to debility after cranioplasty and C-Diff in setting of prior right MCA stroke, left hemiplegia             -patient may not yet shower             -ELOS/Goals: 12-14 days, mod assist with PT, OT, SLP             - resting WHO for LUE - PRAFO to maintain heel cord stretch; intermittent refusal, reinforce  -  CIR therapies ongoing including PT, OT, and SLP              - 10/6 - Of note, no further medication changes without discussing with wife  2.   Antithrombotics: -DVT/anticoagulation:  Pharmaceutical: Eliquis             -antiplatelet therapy: none 3. Pain Management: Tylenol, oxycodone, hydrocodone, Robaxin as needed - significant pain from shoulder subluxation.              - 9/27 schedule Tylenol 1000 mg TID for pain management + Change pain regimen to oxycodone 2.5/5 mg PRN for moderate/severe pain; DC hydrocodone, DC robaxin              - For L shoulder sublux: Order OT taping and ROM, sling when OOB and not mobilizing shoulder, pillow support in bed 9/27 -  improved 9/28               - 10/4 DC PRN Oxycodone due to no use in several days               - add baclofen 10 mg BID for worsening LUE and LLE tone; monitor for cognitive effects -> inc 10 TID 10/4 - improved  4. Mood/Behavior/Sleep: LCSW to evaluate and provide emotional support             -antipsychotic agents: n/a             -depression: continue Lexapro 20 mg daily             -insomnia: continue melatonin; DC trazadone d/t no use             - Adding ritalin 5 mg BID to improve processing speed and attention 10/4 -> decrease to 2.5 BID for verbose, perseverative 10/5 -> DC 10/6 due to insomnia, impulsive behaviors  5. Neuropsych/cognition: This patient is capable of making decisions on his own behalf. 6. Skin/Wound Care: routine skin care checks             -monitor scalp and abdominal surgical incisions - staples removal 9/29 completed without complication - healing well   7. Fluids/Electrolytes/Nutrition:               -carb modified diet  -can dc IVF  9/24-Potassium 3.0--add supplement - 3.2 9/27-- inc supplement to 20 meq BID  - BMP 9/30 improved - stable 10/1    -BUN/Cr wnl    -encourage PO    Latest Ref Rng & Units 02/22/2022    6:22 AM 02/21/2022    7:10 AM 02/18/2022    7:35 AM  BMP  Glucose 70 - 99 mg/dL 509  326  712   BUN 6 - 20 mg/dL 8  8  6    Creatinine 0.61 - 1.24 mg/dL  4.58  0.99   Sodium 135 - 145 mmol/L 138  140  142   Potassium 3.5 -  5.1 mmol/L 3.9  3.9  3.2   Chloride 98 - 111 mmol/L 104  104  103   CO2 22 - 32 mmol/L 27  28  32   Calcium 8.9 - 10.3 mg/dL 9.4  9.2  9.4      8: DM: Hgb A1c = 8.1; on SSI             -consider restarting home Lantus and Novolog    -9/24 fair control. Hold scheduled insulin for now            - 9/26 - rare PM highs >200 ~2100; otherwise well controlled             - 9/29 - consistent use of SSI 3-9 units with increased needs at night; will start Lantus 3U daily 9/30            - 10/2 Increase Lantus to 5U daily -> 8U daily 10/4 -> 10 U 10/6  Recent Labs    02/26/22 2054 02/27/22 0624 02/27/22 1133  GLUCAP 207* 171* 166*       9: Hypertension: monitor             -continue clonidine patch 0.3 mg weekly             -continue amlodipine 10 mg daily             -continue Lopressor 25 mg BID             -  continue lisinopril 40 mg daily             - BP well controlled ~120s; monitor    02/27/2022    4:31 AM 02/26/2022    9:51 PM 02/26/2022    7:35 PM  Vitals with BMI  Systolic 114 146 004  Diastolic 76 71 72  Pulse 66 92 80     10: C. diff diarrhea: continue oral vanc, ended 9/30; discontinue Colace for now             - Now daily BM, formed             - Per infection control , continue PO vanc for duration, precautions must remain for 30 days.              - 10/1 - inquiring with infection control on grounds pass once finishes with abx - approved  -10/2 Reports stool more firm yesterday            - 10/4 hard Bms; adding Sennakot 1 tab nightly  11. Incontinence of bowel and bladder - ongoing             - 10/4 pt reports sensate for both, can't hold; initiate timed toiletting Q2H - reinforced to nursing must get OOB 10/6             - 10/6 Multiple BM overnight  - DC sennakot QHS  LOS: 13 days A FACE TO FACE EVALUATION WAS PERFORMED  Angelina Sheriff 02/27/2022, 12:53 PM

## 2022-02-27 NOTE — Progress Notes (Signed)
Physical Therapy Session Note  Patient Details  Name: Jacob [name] MRN: [id number] Date of Birth: Sep 21, 1975  Today's Date: 02/27/2022 PT Individual Time: 4098-1191 PT Individual Time Calculation (min): 43 min   Short Term Goals: Week 1:  PT Short Term Goal 1 (Week 1): Patient will perform bed mobility with min A consistently. PT Short Term Goal 1 - Progress (Week 1): Progressing toward goal PT Short Term Goal 2 (Week 1): Patient will perform basic transfers with min A >50% of the time. PT Short Term Goal 2 - Progress (Week 1): Progressing toward goal PT Short Term Goal 3 (Week 1): Patient will ambulate >100 ft using LRAD with mod A. PT Short Term Goal 3 - Progress (Week 1): Progressing toward goal Week 2:  PT Short Term Goal 1 (Week 2): Pt will perform bed mobility with minA consistently. PT Short Term Goal 2 (Week 2): Pt will perform basic transfers with minA >50% of time. PT Short Term Goal 3 (Week 2): Pt will ambulate 100' with LRAD and modA.   Skilled Therapeutic Interventions/Progress Updates:  Patient seated in recliner on entrance to room. Patient alert and agreeable to PT session. Very verbose throughout session and focused on having therapy session outside. Pt informed that time is short and will not have time to go outside and pt with no concept of time remaining and time required to transport outside.   Patient with no pain complaint at start of session.  Therapeutic Activity: Transfers: Pt performed sit<>stand and stand pivot transfers throughout session with ModA initially and improves to MinA/ CGA with HW for sit/ stand and Min/ ModA for stand pivot. With slower performance and focus on required movement, pt's quality of performance and LOA for standing balance improves. Provided vc/ tc for sequencing forward scoot, Bil foot positioning and forward hip hinge to improve technique.  Pt ambulates x50' with modA overall, with PT facilitating L lower extremity swing phase, as  well as blocking of L knee durin gstance phase. Cues provided for AD management and sequencing.   Gait Training:  Pt with ACE wrap to LLE for DF assist. Pt ambulated using HW in circuit in therapy gym requiring reach out to tap cones placed within 65 ft circuitous path. Requires heavy modA to maintain standing balance throughout as pt demos reduced trunk control. Also requires Mod A for L knee control Provided vc/ tc for sequencing and technique required throughout.  Wheelchair Mobility:  Pt propelled wheelchair 75 feet with supervision and hemitechnique. Relates that he will not be pushing w/c for himself at home. Will always have someone to push him.   Neuromuscular Re-ed: NMR facilitated during session with focus on standing balance. Pt guided in sit<>stand x6 infront of basketball goal with pt requested to tap hoop/ backboard for upward reach. Performs requiring Mod/ MaxA initially, then educated on need to slow performance as well as to improve technique as listed above and pt improves to Min/ CGA NMR performed for improvements in motor control and coordination, balance, sequencing, judgement, and self confidence/ efficacy in performing all aspects of mobility at highest level of independence.   Patient seated upright in recliner at end of session with brakes locked, belt alarm set, and all needs within reach. Oriented to time and time of next session right then with ST.    Therapy Documentation Precautions:  Precautions Precautions: Fall Precaution Comments: L hemiplegia; painful LUE/shoulder; Required Braces or Orthoses: Splint/Cast Splint/Cast: WHO - Resting hand splint at night Restrictions Weight  Bearing Restrictions: No General:   Vital Signs: Therapy Vitals Temp: 98 F (36.7 C) Temp Source: Oral Pulse Rate: 60 BP: (!) 109/94 Patient Position (if appropriate): Lying Oxygen Therapy SpO2: 100 % O2 Device: Room Air Pain:  Np pain indicated throughout session.    Therapy/Group: Individual Therapy  Alger Simons PT, DPT, [facility] 02/27/2022, 4:54 PM

## 2022-02-27 NOTE — Progress Notes (Signed)
Patient refused to wear left resting hand splint and PRAFO. Patient educated on risk/benefits. Will continue to provide education on hand splint and PRAFO.

## 2022-02-27 NOTE — Progress Notes (Signed)
Occupational Therapy Session Note  Patient Details  Name: Jacob [name] MRN: [id number] Date of Birth: November 07, 1975  Today's Date: 02/27/2022 OT Individual Time: 6606-3016 OT Individual Time Calculation (min): 60 min    Short Term Goals: Week 2:  OT Short Term Goal 1 (Week 2): Patient will transfer to commode with min assist OT Short Term Goal 2 (Week 2): Patient will tolerate passive stretch to LUE to 90* of shoulder flexion, and at least 20* of external rotation without significant increase in report of pain. OT Short Term Goal 3 (Week 2): Patient will demonstrate improved attention to L side by locating 2 items on L side of the sink with min questioning cues.  Skilled Therapeutic Interventions/Progress Updates: Patient seen for neuro reeducation and mobilization of the LUE working to improve pain and gain functional mobility to better tolerate self care tasks. Patient treatment started with mobilization of the scapula  including upward rotation with humeral support to prepare for shoulder flexion. Continued with IR/ER mobilization and range prior to shoulder flexion. Patient tolerating shoulder flexion only up to 75 degrees before patient reports pain. Worked on circumduction mobilizations to reduce discomfort. Worked on supination/pronation mobilization stretches tolerating supination to 45. Carpal mobilizations tolerated without complaint and functional ROM. Digit ROM with sore end range into flexion and extension. Followed with self stretch with UE supported on table and having patient work on forward lean to promote shoulder flexion and external rotation. Good participation and tolerance of mobility work.     Therapy Documentation Precautions:  Precautions Precautions: Fall Precaution Comments: L hemiplegia; painful LUE/shoulder; Required Braces or Orthoses: Splint/Cast Splint/Cast: WHO - Resting hand splint at night Restrictions Weight Bearing Restrictions: No    Pain: Pain  Assessment Pain Scale: 0-10 Pain Score: 0-No pain ADL: ADL Eating: Minimal assistance Where Assessed-Eating: Wheelchair Grooming: Moderate assistance Where Assessed-Grooming: Sitting at sink Upper Body Bathing: Dependent Where Assessed-Upper Body Bathing: Bed level Lower Body Bathing: Dependent Where Assessed-Lower Body Bathing: Bed level Upper Body Dressing: Maximal assistance Where Assessed-Upper Body Dressing: Wheelchair Lower Body Dressing: Maximal assistance Where Assessed-Lower Body Dressing: Chair Toileting: Dependent Where Assessed-Toileting: Bed level Toilet Transfer: Unable to assess Toilet Transfer Method: Unable to assess Tub/Shower Transfer: Unable to assess Tub/Shower Transfer Method: Unable to assess Social research officer, government: Unable to assess Social research officer, government Method: Unable to assess [employer]: Shower seat with back ADL Comments: Patient reports he has shower seat at home - but he has not been able to get into shower      Therapy/Group: Individual Therapy  Hermina Barters 02/27/2022, 12:55 PM

## 2022-02-27 NOTE — Progress Notes (Signed)
Occupational Therapy Session Note  Patient Details  Name: Jacob [name] MRN: [id number] Date of Birth: 1975/06/18  Today's Date: 02/27/2022 OT Individual Time: 5188-4166 OT Individual Time Calculation (min): 39 min    Short Term Goals: Week 2:  OT Short Term Goal 1 (Week 2): Patient will transfer to commode with min assist OT Short Term Goal 2 (Week 2): Patient will tolerate passive stretch to LUE to 90* of shoulder flexion, and at least 20* of external rotation without significant increase in report of pain. OT Short Term Goal 3 (Week 2): Patient will demonstrate improved attention to L side by locating 2 items on L side of the sink with min questioning cues.  Skilled Therapeutic Interventions/Progress Updates:    Pt greeted semi-reclined in bed, initially did not want to get OOB, but agreeable to get up with encouragement. Pt incontinent of urine and soaked through clothes and bed sheet. Pt unaware and even stated "no way, I am dry." Rolling completed at bed level with min A to the L and mod A to the R. Pt able to reach behind and help wash buttocks in sidelying. Pt then transitioned to sitting EOB with bed rails, increased time,a dn mod A. LB dressing at EOB with focus on anterior weight shift and reaching while maintaining sitting balance. Still needed OT assist to thread pants, then sit<>stand at EOB with min A. Pt initiated pulling pants up with min/mod A for balance without UE support. Pt returned to sitting and completed UB bathing at EOB. OT assist to was R UE but he was able to wash L side with min cues for attention. UB dressing at EOB with pt able to recall hemi-dressing techniques, but then tried to thread  L UE through the head hole instead of the bottom of the shirt. Blocked practice for UB dressing. Pt then completed stand-pivot to wc with min A to stand, and min A to pivot. Handoff to next therapist.   Therapy Documentation Precautions:  Precautions Precautions: Fall Precaution  Comments: L hemiplegia; painful LUE/shoulder; Required Braces or Orthoses: Splint/Cast Splint/Cast: WHO - Resting hand splint at night Restrictions Weight Bearing Restrictions: No Pain:  Denies pain   Therapy/Group: Individual Therapy  Valma Cava 02/27/2022, 4:17 PM

## 2022-02-27 NOTE — Progress Notes (Signed)
Speech Language Pathology Daily Session Note  Patient Details  Name: Jacob [name] MRN: [id number] Date of Birth: 1975-07-09  Today's Date: 02/27/2022 SLP Individual Time: 1350-1430 SLP Individual Time Calculation (min): 40 min  Short Term Goals: Week 2: SLP Short Term Goal 1 (Week 2): Pt will participate in functional and therapeutic recall activities targeting short-term and working memory with 80% accuracy given Mod A multimodal cues. SLP Short Term Goal 2 (Week 2): Pt will attend to functional and therapeutic tasks for 15 minute intervals with Mod verbal cues for redirection and/or to maintain alertness. SLP Short Term Goal 3 (Week 2): Pt will complete basic problem-solving tasks with 80% accuracy given Min A vebral and visual cues. SLP Short Term Goal 4 (Week 2): Pt will utilize visual scanning strategies to participate in functional vision tasks to include reading with 100% accuracy given Min A verbal and visual prompts. SLP Short Term Goal 5 (Week 2): Pt will tolerate current diet of regular textures and thin liquids with no clinical s/sx concerning for airway invasion given supervision verbal cues for adherence to aspiration precautions.  Skilled Therapeutic Interventions: Skilled treatment session focused on cognitive goals. Upon arrival, patient was awake while upright in the recliner. Patient perseverative on going outside despite SLP telling the patient we are unable to today due to time constraints. SLP offered to transfer patient to SLP office for session to help facilitate a change of environment. Throughout the transfer, Mod verbal cues were needed for safety and problem solving with patient reporting to the SLP that she "was doing it wrong." SLP expressed that she was trying to keep the patient safe and that she did not appreciate the patient's tone nor his choice of words. Patient then reported, "You can't do anything to me." SLP clarified that she was not threatening the patient  but expressing her discomfort in the way the patient was speaking to her. SLP redirected patient and propelled him to the SLP office. SLP provided Max A verbal cues for functional problem solving, left visual scanning and error awareness during a complex medication management task. SLP attempted to facilitate a conversation that focused on anticipatory awareness with minimal engagement from patient. Patient was brought back to the room and transferred back to the bed per his request with a second person Jarrett Soho, [place]) present for safety. Patient left supine in bed with alarm on and all needs within reach. Continue with current plan of care.      Pain No/Denies Pain   Therapy/Group: Individual Therapy  Tonesha Tsou 02/27/2022, 3:38 PM

## 2022-02-28 LAB — GLUCOSE, CAPILLARY
Glucose-Capillary: 169 mg/dL — ABNORMAL HIGH (ref 70–99)
Glucose-Capillary: 162 mg/dL — ABNORMAL HIGH (ref 70–99)
Glucose-Capillary: 120 mg/dL — ABNORMAL HIGH (ref 70–99)
Glucose-Capillary: 193 mg/dL — ABNORMAL HIGH (ref 70–99)

## 2022-02-28 NOTE — Progress Notes (Signed)
Speech Language Pathology Daily Session Note  Patient Details  Name: Jacob [name] MRN: [id number] Date of Birth: [birth year]  Today's Date: 02/28/2022 SLP Individual Time: 4967-5916 SLP Individual Time Calculation (min): 44 min  Short Term Goals: Week 2: SLP Short Term Goal 1 (Week 2): Pt will participate in functional and therapeutic recall activities targeting short-term and working memory with 80% accuracy given Mod A multimodal cues. SLP Short Term Goal 2 (Week 2): Pt will attend to functional and therapeutic tasks for 15 minute intervals with Mod verbal cues for redirection and/or to maintain alertness. SLP Short Term Goal 3 (Week 2): Pt will complete basic problem-solving tasks with 80% accuracy given Min A vebral and visual cues. SLP Short Term Goal 4 (Week 2): Pt will utilize visual scanning strategies to participate in functional vision tasks to include reading with 100% accuracy given Min A verbal and visual prompts. SLP Short Term Goal 5 (Week 2): Pt will tolerate current diet of regular textures and thin liquids with no clinical s/sx concerning for airway invasion given supervision verbal cues for adherence to aspiration precautions.  Skilled Therapeutic Interventions: Pt seen for skilled ST with focus on cognitive goals, pt in bed and agreeable to therapeutic tasks. SLP facilitating functional visual scanning task (spatial orientation with maps, floor plans, etc) with patient performance 100% accuracy with Min A verbal cues. Pt also participating in simple alternating attention task benefiting from min A verbal cues for 100% accuracy. Pt reports enjoying the challenge during the attention task, stating "it was a brain work out, I liked it". 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 Pt left in bed with all needs met, cont ST POC.  Pain Pain Assessment Pain Scale: 0-10 Pain Score: 0-No  pain  Therapy/Group: Individual Therapy  Jacob [name] 02/28/2022, 10:08 AM

## 2022-02-28 NOTE — Progress Notes (Signed)
Physical Therapy Session Note  Patient Details  Name: DELMUS WARWICK MRN: [id number] Date of Birth: 1975-11-16  Today's Date: 02/28/2022 PT Individual Time: 1430-1525 PT Individual Time Calculation (min): 55 min   Short Term Goals: Week 2:  PT Short Term Goal 1 (Week 2): Pt will perform bed mobility with minA consistently. PT Short Term Goal 2 (Week 2): Pt will perform basic transfers with minA >50% of time. PT Short Term Goal 3 (Week 2): Pt will ambulate 100' with LRAD and modA.  Skilled Therapeutic Interventions/Progress Updates:    pt received in bed and agreeable to therapy. No complaint of pain. Supine>sit with supervision and bed features. Donned pants and shoes tot a. Stood with mod A and HHA while pulling up pants with assist. Pt performed squat pivot to R side with CGA. Pt requesting to go outside for gait training. Pt ambulated ~ 10 ft with mod A x 3 with seated rest breaks. Increased difficulty noted on unlevel surface. At this time, pt's friend arrived and chatted for several minutes. Noted improved mood after speaking with friend. Pt propelled w/c x 50 ft with hemi technique, but required min A d/t poor traction. Would likely perform better with hemi height chair. Pt returned to room and performed ambulatory transfer to recliner with HW and mod. Pt set up in chair and was left with all needs in reach and alarm active.   Therapy Documentation Precautions:  Precautions Precautions: Fall Precaution Comments: L hemiplegia; painful LUE/shoulder; Required Braces or Orthoses: Splint/Cast Splint/Cast: WHO - Resting hand splint at night Restrictions Weight Bearing Restrictions: No General:       Therapy/Group: Individual Therapy  Mickel Fuchs 02/28/2022, 3:27 PM

## 2022-03-01 LAB — BASIC METABOLIC PANEL
Anion gap: 8 (ref 5–15)
BUN: 8 mg/dL (ref 6–20)
CO2: 28 mmol/L (ref 22–32)
Calcium: 10.1 mg/dL (ref 8.9–10.3)
Chloride: 105 mmol/L (ref 98–111)
Creatinine, Ser: 0.62 mg/dL (ref 0.61–1.24)
GFR, Estimated: >60 mL/min (ref >60)
Glucose, Bld: 131 mg/dL — ABNORMAL HIGH (ref 70–99)
Potassium: 3.8 mmol/L (ref 3.5–5.1)
Sodium: 141 mmol/L (ref 135–145)

## 2022-03-01 LAB — GLUCOSE, CAPILLARY
Glucose-Capillary: 137 mg/dL — ABNORMAL HIGH (ref 70–99)
Glucose-Capillary: 165 mg/dL — ABNORMAL HIGH (ref 70–99)
Glucose-Capillary: 136 mg/dL — ABNORMAL HIGH (ref 70–99)
Glucose-Capillary: 203 mg/dL — ABNORMAL HIGH (ref 70–99)

## 2022-03-01 LAB — CBC
HCT: 33 % — ABNORMAL LOW (ref 39.0–52.0)
Hemoglobin: 11 g/dL — ABNORMAL LOW (ref 13.0–17.0)
MCH: 27.7 pg (ref 26.0–34.0)
MCHC: 33.3 g/dL (ref 30.0–36.0)
MCV: 83.1 fL (ref 80.0–100.0)
Platelets: 256 10*3/uL (ref 150–400)
RBC: 3.97 MIL/uL — ABNORMAL LOW (ref 4.22–5.81)
RDW: 17.6 % — ABNORMAL HIGH (ref 11.5–15.5)
WBC: 5.1 10*3/uL (ref 4.0–10.5)
nRBC: 0 % (ref 0.0–0.2)

## 2022-03-01 NOTE — Progress Notes (Signed)
PROGRESS NOTE   Subjective/Complaints:  Pt seen at beside. No new concerns or complaints this AM.  Reports pain is well controlled.   LBM yesterday  ROS: Patient denies fever, rash, sore throat, blurred vision, dizziness, nausea, vomiting, diarrhea, cough, shortness of breath or chest pain, joint or back/neck pain.   Objective:   No results found. Recent Labs    03/01/22 0643  WBC 5.1  HGB 11.0*  HCT 33.0*  PLT 256    Recent Labs    03/01/22 0643  NA 141  K 3.8  CL 105  CO2 28  GLUCOSE 131*  BUN 8  CREATININE 0.62  CALCIUM 10.1     Intake/Output Summary (Last 24 hours) at 03/01/2022 1135 Last data filed at 03/01/2022 0807 Gross per 24 hour  Intake 720 ml  Output --  Net 720 ml         Physical Exam: Vital Signs Blood pressure 117/71, pulse 63, temperature 97.7 F (36.5 C), resp. rate 20, height 6\' 1"  (1.854 m), weight 81 kg, SpO2 100 %.  General: No apparent distress HEENT: Large cranioplasty w/out swelling, stable.  +Strabismus, stable. oral mucosa pink and moist Neck: Supple without JVD or lymphadenopathy Heart: Reg rate and rhythm. No murmurs rubs or gallops Chest: CTA bilaterally without wheezes, rales, or rhonchi; no distress. Normal rate. Non-labored Abdomen: Soft, non-tender, non-distended, bowel sounds positive. Extremities: No clubbing, cyanosis, or edema.  Psych: Pt's affect is appropriate. Pt is cooperative Skin: Crani incision and abdominal incisions c/d/I, staples s/p removal. Mostly scarred with a few scabbed areas left. No erythema or rashes.   Neuro:  pt is alert and oriented x3. Left central 7, mild dysarthria. L neglect, improved.  Not particularly Verbose today Insight: Poor insight into condition.   Prior exam Tone: LUE contractures in finger flexors, MAS 2 wrist flexors, MAS 2 eblow flexors, MAS 2 elbow extensors LLE MAS 1+ knee flexors, MAS 1+ hip flexors, MAS 1+ hip  adductors, MAS 1 ankle plantarflexors Reflexes: 2-3 beats clonus L ankle Musculoskeletal: shoulder subluxation 2 fingerbreadths, no TTP. +sling   Assessment/Plan: 1. Functional deficits which require 3+ hours per day of interdisciplinary therapy in a comprehensive inpatient rehab setting. Physiatrist is providing close team supervision and 24 hour management of active medical problems listed below. Physiatrist and rehab team continue to assess barriers to discharge/monitor patient progress toward functional and medical goals  Care Tool:  Bathing        Body parts bathed by helper: Right arm, Left arm, Chest, Abdomen, Front perineal area, Buttocks, Left lower leg, Right lower leg, Left upper leg, Right upper leg, Face     Bathing assist Assist Level: Dependent - Patient 0%     Upper Body Dressing/Undressing Upper body dressing   What is the patient wearing?: Pull over shirt    Upper body assist Assist Level: Maximal Assistance - Patient 25 - 49%    Lower Body Dressing/Undressing Lower body dressing      What is the patient wearing?: Pants, Incontinence brief     Lower body assist Assist for lower body dressing: Total Assistance - Patient < 25%     Activity  did not occur Press photographer and hygiene only): Refused  Toileting assist Assist for toileting: 2 Helpers     Transfers Chair/bed transfer  Transfers assist     Chair/bed transfer assist level: Moderate Assistance - Patient 50 - 74%     Locomotion Ambulation   Ambulation assist      Assist level: Maximal Assistance - Patient 25 - 49% Assistive device: Walker-rolling Max distance: 2 feet   Walk 10 feet activity   Assist  Walk 10 feet activity did not occur: Safety/medical concerns        Walk 50 feet activity   Assist Walk 50 feet with 2 turns activity did not occur: Safety/medical concerns         Walk 150 feet activity   Assist Walk 150 feet  activity did not occur: Safety/medical concerns         Walk 10 feet on uneven surface  activity   Assist Walk 10 feet on uneven surfaces activity did not occur: Safety/medical concerns         Wheelchair     Assist Is the patient using a wheelchair?: Yes Type of Wheelchair: Manual    Wheelchair assist level: Supervision/Verbal cueing Max wheelchair distance: 106 ft    Wheelchair 50 feet with 2 turns activity    Assist        Assist Level: Supervision/Verbal cueing   Wheelchair 150 feet activity     Assist      Assist Level: Moderate Assistance - Patient 50 - 74%   Blood pressure 117/71, pulse 63, temperature 97.7 F (36.5 C), resp. rate 20, height 6\' 1"  (1.854 m), weight 81 kg, SpO2 100 %.  Medical Problem List and Plan: 1. Functional deficits secondary to debility after cranioplasty and C-Diff in setting of prior right MCA stroke, left hemiplegia             -patient may not yet shower             -ELOS/Goals: 12-14 days, mod assist with PT, OT, SLP             - resting WHO for LUE - PRAFO to maintain heel cord stretch; intermittent refusal, reinforce  -  CIR therapies ongoing including PT, OT, and SLP              - 10/6 - Of note, no further medication changes without discussing with wife  2.  Antithrombotics: -DVT/anticoagulation:  Pharmaceutical: Eliquis             -antiplatelet therapy: none 3. Pain Management: Tylenol, oxycodone, hydrocodone, Robaxin as needed - significant pain from shoulder subluxation.              - 9/27 schedule Tylenol 1000 mg TID for pain management + Change pain regimen to oxycodone 2.5/5 mg PRN for moderate/severe pain; DC hydrocodone, DC robaxin              - For L shoulder sublux: Order OT taping and ROM, sling when OOB and not mobilizing shoulder, pillow support in bed 9/27 - improved 9/28               - 10/4 DC PRN Oxycodone due to no use in several days               - add baclofen 10 mg BID for worsening  LUE and LLE tone; monitor for cognitive effects -> inc 10 TID 10/4 - improved  4. Mood/Behavior/Sleep: LCSW to evaluate  and provide emotional support             -antipsychotic agents: n/a             -depression: continue Lexapro 20 mg daily             -insomnia: continue melatonin; DC trazadone d/t no use             - Adding ritalin 5 mg BID to improve processing speed and attention 10/4 -> decrease to 2.5 BID for verbose, perseverative 10/5 -> DC 10/6 due to insomnia, impulsive behaviors  5. Neuropsych/cognition: This patient is capable of making decisions on his own behalf. 6. Skin/Wound Care: routine skin care checks             -monitor scalp and abdominal surgical incisions - staples removal 9/29 completed without complication - healing well   7. Fluids/Electrolytes/Nutrition:               -carb modified diet  -can dc IVF  9/24-Potassium 3.0--add supplement - 3.2 9/27-- inc supplement to 20 meq BID  - BMP 9/30 improved - stable 10/1    -BUN/Cr wnl    -encourage PO    Latest Ref Rng & Units 03/01/2022    6:43 AM 02/22/2022    6:22 AM 02/21/2022    7:10 AM  BMP  Glucose 70 - 99 mg/dL 131  177  200   BUN 6 - 20 mg/dL 8  8  8    Creatinine 0.61 - 1.24 mg/dL 0.62  0.63  0.51   Sodium 135 - 145 mmol/L 141  138  140   Potassium 3.5 - 5.1 mmol/L 3.8  3.9  3.9   Chloride 98 - 111 mmol/L 105  104  104   CO2 22 - 32 mmol/L 28  27  28    Calcium 8.9 - 10.3 mg/dL 10.1  9.4  9.2    10/8 BUN/Cr WNL, continue to monitor  8: DM: Hgb A1c = 8.1; on SSI             -consider restarting home Lantus and Novolog    -9/24 fair control. Hold scheduled insulin for now            - 9/26 - rare PM highs >200 ~2100; otherwise well controlled             - 9/29 - consistent use of SSI 3-9 units with increased needs at night; will start Lantus 3U daily 9/30            - 10/2 Increase Lantus to 5U daily -> 8U daily 10/4 -> 10 U 10/6  10/8 Fairly well controlled, continue current regimen and  monitor  Recent Labs    02/28/22 2100 03/01/22 0710 03/01/22 1125  GLUCAP 193* 137* 165*      9: Hypertension: monitor             -continue clonidine patch 0.3 mg weekly             -continue amlodipine 10 mg daily             -continue Lopressor 25 mg BID             -continue lisinopril 40 mg daily             - 10/8 well controlled, continue to monitor    03/01/2022    4:16 AM 02/28/2022    7:37 PM 02/28/2022    3:36 PM  Vitals with BMI  Systolic 117 130 945  Diastolic 71 88 77  Pulse 63 64 65     10: C. diff diarrhea: continue oral vanc, ended 9/30; discontinue Colace for now             - Now daily BM, formed             - Per infection control , continue PO vanc for duration, precautions must remain for 30 days.              - 10/1 - inquiring with infection control on grounds pass once finishes with abx - approved  -10/2 Reports stool more firm yesterday            - 10/4 hard Bms; adding Sennakot 1 tab nightly  -10/8 LBM yesterday-large mushy, not on any laxatives currently, continue to monitor  11. Incontinence of bowel and bladder - ongoing             - 10/4 pt reports sensate for both, can't hold; initiate timed toiletting Q2H - reinforced to nursing must get OOB 10/6             - 10/6 Multiple BM overnight  - DC sennakot QHS  LOS: 15 days A FACE TO FACE EVALUATION WAS PERFORMED  Fanny Dance 03/01/2022, 11:35 AM

## 2022-03-02 LAB — GLUCOSE, CAPILLARY
Glucose-Capillary: 129 mg/dL — ABNORMAL HIGH (ref 70–99)
Glucose-Capillary: 169 mg/dL — ABNORMAL HIGH (ref 70–99)
Glucose-Capillary: 167 mg/dL — ABNORMAL HIGH (ref 70–99)
Glucose-Capillary: 216 mg/dL — ABNORMAL HIGH (ref 70–99)

## 2022-03-02 NOTE — Progress Notes (Signed)
Physical Therapy Session Note  Patient Details  Name: Jacob [name] MRN: [id number] Date of Birth: 05-Jun-1975  Today's Date: 03/02/2022 PT Individual Time: 7628-3151 PT Individual Time Calculation (min): 72 min   Short Term Goals: Week 2:  PT Short Term Goal 1 (Week 2): Pt will perform bed mobility with minA consistently. PT Short Term Goal 2 (Week 2): Pt will perform basic transfers with minA >50% of time. PT Short Term Goal 3 (Week 2): Pt will ambulate 100' with LRAD and modA.  Skilled Therapeutic Interventions/Progress Updates:     Pt received seated in recliner and agrees to therapy. No complaint of pain. Sit to stand to hemiwalker with minA initially progressing to modA upon standing due to posterior bias. Stand step transfer to [facility] with modA and cues for sequencing and positioning. WC transport to gym for time management. PT ace wraps L lower extremity to promote dorsiflexion and eversion. Pt performs sit to stand with hemiwalker with minA and cues for hand placement and sequencing. Pt ambulates x75' with modA and pt initiating L stride >75% of time and PT facilitating functional stride length to allow for optimal gait mechanics. Cues for lateral weight shifting, upright posture, blocking L knee during stance phase, and safe management of hemiwalker. Pt becomes very fatigued, especially in R lower extremity, and requests WC for unplanned seated rest break. Pt takes extended seated rest break. WC transport outside. Pt practices WC propulsion outside for training over unlevel surfaces. Pt utilizes R upper and lower extremities and "pulls" self around fountain outside, with multiple rest breaks and modA overall required due to pt contacting brick curb frequently. WC transport back inside. Pt performs additional ambulation bout with hemiwalker, completing x40' prior to requesting rest break. WC transport back to room. Squat pivot to bed with minA and cues for body mechanics. Sit to supine with  verbal cues for sequencing and minA to adjust shoulders upon return to supine. Left supine with alarm intact and all needs within reach.  Therapy Documentation Precautions:  Precautions Precautions: Fall Precaution Comments: L hemiplegia; painful LUE/shoulder; Required Braces or Orthoses: Splint/Cast Splint/Cast: WHO - Resting hand splint at night Restrictions Weight Bearing Restrictions: No    Therapy/Group: Individual Therapy  Breck Coons, PT, DPT 03/02/2022, 5:22 PM

## 2022-03-02 NOTE — Progress Notes (Signed)
Occupational Therapy Session Note  Patient Details  Name: Jacob [name] MRN: [id number] Date of Birth: 04/14/1976  Today's Date: 03/02/2022 OT Individual Time: 1045-1200 OT Individual Time Calculation (min): 75 min    Short Term Goals: Week 2:  OT Short Term Goal 1 (Week 2): Patient will transfer to commode with min assist OT Short Term Goal 2 (Week 2): Patient will tolerate passive stretch to LUE to 90* of shoulder flexion, and at least 20* of external rotation without significant increase in report of pain. OT Short Term Goal 3 (Week 2): Patient will demonstrate improved attention to L side by locating 2 items on L side of the sink with min questioning cues.  Skilled Therapeutic Interventions/Progress Updates:    Upon OT arrival, pt semi recumbent in bed reporting no pain and Is agreeable to OT treatment. Treatment intervention with a focus on ROM exercises and self care retraining. Pt was provided passive ROM to the L UE to reduce tone and improve ROM for 2x10 reps of shoulder flex/ext, circles with shoulder flexion clockwise and counterclockwise, elbow flex/ext, shoulder abld/add, supination/pronation, wrist ext/flex, and finger flex/ext. Pt with fair tolerance to ROM. Pt completes supine to sit transfer with CGA and sits EOB to donn shirt, shorts, and shoes. Pt completes UB dressing with Setup, donn shoes with Max A, donns shorts with Max A. During LB dressing, pt hooked up to purewick and returns to supine with Mod A to remove purewick and noted to have BM. Pt requires Total A for toileting. Pt returns to sitting EOB with CGA and completes sit to stand transfer with hemi walker and Max A for pants management and stands with Mod-Min A.  Pt with heavy L lateral LOB requiring Max A to correct. Pt performs scoot pivot to the R into w/c with CGA and was positioned infront of sink with Total A. Pt performs oral care with Supervision. Pt then completes scoot pivot transfer from w/c to recliner with  CGA and was left in w/c at end of session with all needs met and safety measures in place.   Therapy Documentation Precautions:  Precautions Precautions: Fall Precaution Comments: L hemiplegia; painful LUE/shoulder; Required Braces or Orthoses: Splint/Cast Splint/Cast: WHO - Resting hand splint at night Restrictions Weight Bearing Restrictions: No    Therapy/Group: Individual Therapy     03/02/2022, 8:34 AM 

## 2022-03-02 NOTE — Progress Notes (Signed)
PROGRESS NOTE   Subjective/Complaints:  Pt seen at beside. No acute complaints. No events overnight. Endorses wearing night splints. Discussed getting up with timed toileting, as documentation indicates multiple refusals; explained importance of trying even if he feels he doesn't need to go, given ongoing frequent incontinent episodes.   ROS: Patient denies fever, rash, sore throat, blurred vision, dizziness, nausea, vomiting, diarrhea, cough, shortness of breath or chest pain, joint or back/neck pain.   Objective:   No results found. Recent Labs    03/01/22 0643  WBC 5.1  HGB 11.0*  HCT 33.0*  PLT 256    Recent Labs    03/01/22 0643  NA 141  K 3.8  CL 105  CO2 28  GLUCOSE 131*  BUN 8  CREATININE 0.62  CALCIUM 10.1     Intake/Output Summary (Last 24 hours) at 03/02/2022 2045 Last data filed at 03/02/2022 1811 Gross per 24 hour  Intake 720 ml  Output --  Net 720 ml         Physical Exam: Vital Signs Blood pressure 116/73, pulse 63, temperature 98.3 F (36.8 C), resp. rate 16, height 6\' 1"  (1.854 m), weight 81 kg, SpO2 100 %.  General: No apparent distress HEENT: Large cranioplasty w/out swelling, stable.  +Strabismus, stable. oral mucosa pink and moist Neck: Supple without JVD or lymphadenopathy Heart: Reg rate and rhythm. No murmurs rubs or gallops Chest: CTA bilaterally without wheezes, rales, or rhonchi; no distress. Normal rate. Non-labored Abdomen: Soft, non-tender, non-distended, bowel sounds positive. Extremities: No clubbing, cyanosis, or edema.  Psych: Pt's affect is appropriate. Pt is cooperative Skin: Crani incision and abdominal incisions healed, staples s/p removal.  Neuro:  pt is alert and oriented x3. Left central 7, mild dysarthria. L neglect, improved.  No further verbosity or pressured speech. Insight: Poor insight into condition.   Prior exam Tone: LUE contractures in finger  flexors, MAS 2 wrist flexors, MAS 2 eblow flexors, MAS 2 elbow extensors LLE MAS 1+ knee flexors, MAS 1+ hip flexors, MAS 1+ hip adductors, MAS 1 ankle plantarflexors Musculoskeletal: shoulder subluxation 2 fingerbreadths, no TTP. +sling   Assessment/Plan: 1. Functional deficits which require 3+ hours per day of interdisciplinary therapy in a comprehensive inpatient rehab setting. Physiatrist is providing close team supervision and 24 hour management of active medical problems listed below. Physiatrist and rehab team continue to assess barriers to discharge/monitor patient progress toward functional and medical goals  Care Tool:  Bathing        Body parts bathed by helper: Right arm, Left arm, Chest, Abdomen, Front perineal area, Buttocks, Left lower leg, Right lower leg, Left upper leg, Right upper leg, Face     Bathing assist Assist Level: Dependent - Patient 0%     Upper Body Dressing/Undressing Upper body dressing   What is the patient wearing?: Pull over shirt    Upper body assist Assist Level: Maximal Assistance - Patient 25 - 49%    Lower Body Dressing/Undressing Lower body dressing      What is the patient wearing?: Pants, Incontinence brief     Lower body assist Assist for lower body dressing: Total Assistance - Patient < 25%  Toileting Toileting Toileting Activity did not occur Landscape architect and hygiene only): Refused  Toileting assist Assist for toileting: 2 Helpers     Transfers Chair/bed transfer  Transfers assist     Chair/bed transfer assist level: Moderate Assistance - Patient 50 - 74%     Locomotion Ambulation   Ambulation assist      Assist level: Maximal Assistance - Patient 25 - 49% Assistive device: Walker-rolling Max distance: 2 feet   Walk 10 feet activity   Assist  Walk 10 feet activity did not occur: Safety/medical concerns        Walk 50 feet activity   Assist Walk 50 feet with 2 turns activity did not  occur: Safety/medical concerns         Walk 150 feet activity   Assist Walk 150 feet activity did not occur: Safety/medical concerns         Walk 10 feet on uneven surface  activity   Assist Walk 10 feet on uneven surfaces activity did not occur: Safety/medical concerns         Wheelchair     Assist Is the patient using a wheelchair?: Yes Type of Wheelchair: Manual    Wheelchair assist level: Supervision/Verbal cueing Max wheelchair distance: 106 ft    Wheelchair 50 feet with 2 turns activity    Assist        Assist Level: Supervision/Verbal cueing   Wheelchair 150 feet activity     Assist      Assist Level: Moderate Assistance - Patient 50 - 74%   Blood pressure 116/73, pulse 63, temperature 98.3 F (36.8 C), resp. rate 16, height 6\' 1"  (1.854 m), weight 81 kg, SpO2 100 %.  Medical Problem List and Plan: 1. Functional deficits secondary to debility after cranioplasty and C-Diff in setting of prior right MCA stroke, left hemiplegia             -patient may not yet shower             -ELOS/Goals: 12-14 days, mod assist with PT, OT, SLP             - resting WHO for LUE - PRAFO to maintain heel cord stretch; intermittent refusal, reinforce  -  CIR therapies ongoing including PT, OT, and SLP              - 10/6 - Of note, no further medication changes without discussing with wife  2.  Antithrombotics: -DVT/anticoagulation:  Pharmaceutical: Eliquis             -antiplatelet therapy: none 3. Pain Management: Tylenol, oxycodone, hydrocodone, Robaxin as needed - significant pain from shoulder subluxation.              - 9/27 schedule Tylenol 1000 mg TID for pain management + Change pain regimen to oxycodone 2.5/5 mg PRN for moderate/severe pain; DC hydrocodone, DC robaxin              - For L shoulder sublux: Order OT taping and ROM, sling when OOB and not mobilizing shoulder, pillow support in bed 9/27 - improved 9/28               - 10/4 DC PRN  Oxycodone due to no use in several days               - add baclofen 10 mg BID for worsening LUE and LLE tone; monitor for cognitive effects -> inc 10 TID 10/4 - improved, consider increase  4. Mood/Behavior/Sleep: LCSW to evaluate and provide emotional support             -antipsychotic agents: n/a             -depression: continue Lexapro 20 mg daily             -insomnia: continue melatonin; DC trazadone d/t no use             - Adding ritalin 5 mg BID to improve processing speed and attention 10/4 -> decrease to 2.5 BID for verbose, perseverative 10/5 -> DC 10/6 due to insomnia, impulsive behaviors - resolved  5. Neuropsych/cognition: This patient is capable of making decisions on his own behalf. 6. Skin/Wound Care: routine skin care checks             -monitor scalp and abdominal surgical incisions - staples removal 9/29 completed without complication - healing well   7. Fluids/Electrolytes/Nutrition:               -carb modified diet  -can dc IVF  9/24-Potassium 3.0--add supplement - 3.2 9/27-- inc supplement to 20 meq BID  - BMP 9/30 improved - stable 10/1    -BUN/Cr wnl    -encourage PO    Latest Ref Rng & Units 03/01/2022    6:43 AM 02/22/2022    6:22 AM 02/21/2022    7:10 AM  BMP  Glucose 70 - 99 mg/dL 131  177  200   BUN 6 - 20 mg/dL 8  8  8    Creatinine 0.61 - 1.24 mg/dL 0.62  0.63  0.51   Sodium 135 - 145 mmol/L 141  138  140   Potassium 3.5 - 5.1 mmol/L 3.8  3.9  3.9   Chloride 98 - 111 mmol/L 105  104  104   CO2 22 - 32 mmol/L 28  27  28    Calcium 8.9 - 10.3 mg/dL 10.1  9.4  9.2    10/8 BUN/Cr WNL, continue to monitor  8: DM: Hgb A1c = 8.1; on SSI             -consider restarting home Lantus and Novolog    -9/24 fair control. Hold scheduled insulin for now            - 9/26 - rare PM highs >200 ~2100; otherwise well controlled             - 9/29 - consistent use of SSI 3-9 units with increased needs at night; will start Lantus 3U daily 9/30            - 10/2  Increase Lantus to 5U daily -> 8U daily 10/4 -> 10 U 10/6  10/8 Fairly well controlled, continue current regimen and monitor  Recent Labs    03/02/22 0617 03/02/22 1125 03/02/22 1646  GLUCAP 129* 169* 167*       9: Hypertension: monitor             -continue clonidine patch 0.3 mg weekly             -continue amlodipine 10 mg daily             -continue Lopressor 25 mg BID             -continue lisinopril 40 mg daily             - 10/8 well controlled, continue to monitor    03/02/2022    2:47 PM 03/01/2022    7:49  PM 03/01/2022    3:31 PM  Vitals with BMI  Systolic [id number]  Diastolic 73 76 68  Pulse 63 71 70     10: C. diff diarrhea: continue oral vanc, ended 9/30; discontinue Colace for now             - Now daily BM, formed             - Per infection control , continue PO vanc for duration, precautions must remain for 30 days.              - 10/1 - inquiring with infection control on grounds pass once finishes with abx - approved  -10/2 Reports stool more firm yesterday            - 10/4 hard Bms; adding Sennakot 1 tab nightly  -10/8 LBM yesterday-large mushy, not on any laxatives currently, continue to monitor  11. Incontinence of bowel and bladder - ongoing             - 10/4 pt reports sensate for both, can't hold; initiate timed toiletting Q2H - reinforced to nursing must get OOB 10/6 -> reinforced to patient OOB 10/9             - 10/6 DC sennakot QHS  LOS: 16 days A FACE TO [place] 03/02/2022, 8:45 PM

## 2022-03-02 NOTE — Progress Notes (Signed)
Speech Language Pathology Weekly Progress and Session Note  Patient Details  Name: DAQUARIUS DUBEAU MRN: [id number] Date of Birth: Apr 03, 1976  Beginning of progress report period: February 23, 2022 End of progress report period: March 02, 2022  Today's Date: 03/02/2022 SLP Individual Time: 0413-6438 SLP Individual Time Calculation (min): 41 min  Short Term Goals: Week 2: SLP Short Term Goal 1 (Week 2): Pt will participate in functional and therapeutic recall activities targeting short-term and working memory with 80% accuracy given Mod A multimodal cues. SLP Short Term Goal 1 - Progress (Week 2): Met SLP Short Term Goal 2 (Week 2): Pt will attend to functional and therapeutic tasks for 15 minute intervals with Mod verbal cues for redirection and/or to maintain alertness. SLP Short Term Goal 2 - Progress (Week 2): Met SLP Short Term Goal 3 (Week 2): Pt will complete basic problem-solving tasks with 80% accuracy given Min A vebral and visual cues. SLP Short Term Goal 3 - Progress (Week 2): Met SLP Short Term Goal 4 (Week 2): Pt will utilize visual scanning strategies to participate in functional vision tasks to include reading with 100% accuracy given Min A verbal and visual prompts. SLP Short Term Goal 4 - Progress (Week 2): Met SLP Short Term Goal 5 (Week 2): Pt will tolerate current diet of regular textures and thin liquids with no clinical s/sx concerning for airway invasion given supervision verbal cues for adherence to aspiration precautions. SLP Short Term Goal 5 - Progress (Week 2): Met    New Short Term Goals: Week 3: SLP Short Term Goal 1 (Week 3): STGs=LTGs due to ELOS  Weekly Progress Updates: Patient has made functional gains and has met 5 of 5 STGs this reporting period. Currently, patient is consuming regular textures with thin liquids with minimal overt s/s of aspiration and requires intermittent supervision level verbal cues for use of swallowing compensatory strategies.  Patient also requires overall Min A verbal cues to complete functional and familiar tasks safely in regards to attention, left visual scanning, and problem solving. Patient and family education ongoing. Patient would benefit from continued skilled SLP intervention to maximize his cognitive and swallowing function prior to discharge.     Intensity: Minumum of 1-2 x/day, 30 to 90 minutes Frequency: 3 to 5 out of 7 days Duration/Length of Stay: 03/07/22 Treatment/Interventions: Cognitive remediation/compensation;Patient/family education;Internal/external aids;Functional tasks;Cueing hierarchy;Environmental controls;Therapeutic Activities;Dysphagia/aspiration precaution training   Daily Session  Skilled Therapeutic Interventions: Skilled treatment session focused on cognitive goals. Upon arrival, patient appeared bright and agreeable to treatment session. SLP facilitated session by providing Mod A verbal and visual cues for left visual scanning while reading aloud and during a functional task of writing appointments onto a schedule. Min A verbal cues were also needed for problem solving during the complex scheduling task and for sustained attention. Patient left upright in bed with alarm on and all needs within reach. Continue with current plan of care.       Pain No/Denies Pain   Therapy/Group: Individual Therapy  Devina Bezold 03/02/2022, 6:18 AM

## 2022-03-03 LAB — GLUCOSE, CAPILLARY
Glucose-Capillary: 153 mg/dL — ABNORMAL HIGH (ref 70–99)
Glucose-Capillary: 207 mg/dL — ABNORMAL HIGH (ref 70–99)
Glucose-Capillary: 101 mg/dL — ABNORMAL HIGH (ref 70–99)
Glucose-Capillary: 187 mg/dL — ABNORMAL HIGH (ref 70–99)

## 2022-03-03 NOTE — Progress Notes (Signed)
Physical Therapy Session Note  Patient Details  Name: Jacob [name] MRN: [id number] Date of Birth: [birth year]  Today's Date: 03/03/2022 PT Individual Time: 1030-1125 PT Individual Time Calculation (min): 55 min   Short Term Goals: Week 2:  PT Short Term Goal 1 (Week 2): Pt will perform bed mobility with minA consistently. PT Short Term Goal 2 (Week 2): Pt will perform basic transfers with minA >50% of time. PT Short Term Goal 3 (Week 2): Pt will ambulate 100' with LRAD and modA.   Skilled Therapeutic Interventions/Progress Updates:     Patient in w/c with his wife in the room upon PT arrival. Patient alert and agreeable to PT session. Patient denied pain during session, reports poor sleep last night and remained quiet throughout session.   Patient's wife present for family education and hands on training throughout session. Performed safe guarding with all mobility following PT cues and/or demonstration. Educated on fall risk/prevention, home modifications to prevent falls, and activation of emergency services in the event of a fall during session.   Therapeutic Activity: Bed Mobility: Patient performed supine to/from sit and scooting up in bed with min A with use of hospital bed features per home set-up.  Transfers: Patient performed stand pivot w/c<>ADL recliner with min-mod A x1 with PT demonstrating technique and x1 with his wife performing transfer with min cues for hand placement to assist patient with forward weight shift. Patient wife performed stand pivot bed<>w/c with min A without cues. He performed sit to/from stand x2 with min/mod A using R hemi-walker. Provided verbal cues for forward weight shift and safety and reaching back to sit due to impulsivity. Patient performed a simulated SUV height car transfer with min A using car frame with his R hand with his wife assisting, no cues required for set-up or transfer.   Gait Training:  Patient ambulated 5 feet and 20 feet feet  using R hemi-walker with L AFO with anterior strut to assist DF and prevent knee buckling in stance and shoe cover for improved L foot clearance with min-mod A and >75% assist with L limb advancement. Ambulated with decreased L hip flexor and hamstring activity, reduced R weight shift, increased R trunk lean, forward trunk lean, and downward head gaze. Provided verbal cues for erect posture, sequencing with use of hemi-walker, multimodal cues and assist for L limb advancement and R weight shift in stance.  Wheelchair Mobility:  Patient propelled wheelchair >50 feet using R hemi-technique to simulate home mobility with supervision. Provided verbal cues for use of R foot to steer and reduced veering to the L.  Patient in bed with his wife in the room, cleared patient's wife for transfers bed<>w/c in the room, safety plan updated at end of session with breaks locked and all needs within reach.   Therapy Documentation Precautions:  Precautions Precautions: Fall Precaution Comments: L hemiplegia; painful LUE/shoulder; Required Braces or Orthoses: Splint/Cast Splint/Cast: WHO - Resting hand splint at night Restrictions Weight Bearing Restrictions: No    Therapy/Group: Individual Therapy  Jacob [name] PT, DPT, NCS, CBIS  03/03/2022, 11:26 AM

## 2022-03-03 NOTE — Progress Notes (Signed)
Physical Therapy Session Note  Patient Details  Name: Jacob [name] MRN: [id number] Date of Birth: [birth year]  Today's Date: 03/03/2022 PT Individual Time: 1349-1500 PT Individual Time Calculation (min): 71 min   Short Term Goals: Week 2:  PT Short Term Goal 1 (Week 2): Pt will perform bed mobility with minA consistently. PT Short Term Goal 2 (Week 2): Pt will perform basic transfers with minA >50% of time. PT Short Term Goal 3 (Week 2): Pt will ambulate 100' with LRAD and modA.  Skilled Therapeutic Interventions/Progress Updates:     Pt received supine in bed and agrees to therapy. No complaint of pain. Supine to with with bed features and cues for positioning. PT provides totalA to don shoes and L anterior strut AFO. Stand step transfer to [facility] with modA and cues for sequencing and positioning. WC transport to gym for time management. Pt performs sit to stand with hemiwalker and cues for body mechanics and hand placement. Pt ambulates x40' with hemiwalker and modA, with consistent manual assistance to progress L lower extremity during swing phase, and providing light blocking of L knee during stance phase. Cues provided to increase upright posture to promote improved balance, as well as utilizing passive tension of L hip flexors to assist with progression of L lower extremity.   Pt performs stair training to promote increased loading through L lower extremity in addition to mobility training. PT completes x4 6" steps with R hand rails and modA, with cues for step sequencing and body mechanics.  Pt ambulates with R handrail in hallway with shoe cover on L foot to decrease friction and promote increased ease of progressing L foot during swing phase. Pt ambulates 2x30' with modA and same cueing as above, with increased emphasis on posture to facilitate swing phase for L lower extremity. +2 WC follow for safety.  Pt completes additional x4 6" steps with R handrail, requiring minA to ascend steps  and modA to descend, primarily to manage L lower extremity as pt is unable to provide motor control to progress L foot.   WC transport back to room. Stand pivot back to bed with modA and cues for sequencing. MinA for return to supine for trunk management. Left supine with alarm intact and all needs within reach. Therapy Documentation Precautions:  Precautions Precautions: Fall Precaution Comments: L hemiplegia; painful LUE/shoulder; Required Braces or Orthoses: Splint/Cast Splint/Cast: WHO - Resting hand splint at night Restrictions Weight Bearing Restrictions: No   Therapy/Group: Individual Therapy  Breck Coons, PT, DPT 03/03/2022, 4:49 PM

## 2022-03-03 NOTE — Progress Notes (Signed)
Occupational Therapy Weekly Progress Note  Patient Details  Name: Jacob [name] MRN: [id number] Date of Birth: Mar 01, 1976  Beginning of progress report period: February 15, 2022 End of progress report period: March 03, 2022  Patient has met 1 of 3 short term goals.  Patient is making slow progress towards OT goals. He currently requires min/mod A for UB ADLs, and mod/max A for LB ADLs. PT has refused toilet and shower transfers, but after discussion with wife, we are hoping to get more practice this week.   Patient continues to demonstrate the following deficits: muscle weakness, decreased cardiorespiratoy endurance, impaired timing and sequencing, abnormal tone, unbalanced muscle activation, motor apraxia, ataxia, decreased coordination, and decreased motor planning, decreased visual perceptual skills and field cut, decreased midline orientation, decreased attention to left, left side neglect, and decreased motor planning, decreased initiation, decreased attention, decreased awareness, decreased problem solving, decreased safety awareness, decreased memory, and delayed processing, and decreased sitting balance, decreased standing balance, decreased postural control, hemiplegia, and decreased balance strategies and therefore will continue to benefit from skilled OT intervention to enhance overall performance with BADL and Reduce care partner burden.  Patient not progressing toward long term goals.  See goal revision..  Plan of care revisions: Goals downgraded to mod A..  OT Short Term Goals Week 1:  OT Short Term Goal 1 (Week 1): Patient will don pull over shirt with no more than mod assist OT Short Term Goal 1 - Progress (Week 1): Met OT Short Term Goal 2 (Week 1): Patient will bathe face, chest, abdomen, upper thighs and below knees following set up and cueing with min assist OT Short Term Goal 2 - Progress (Week 1): Met OT Short Term Goal 3 (Week 1): Patient will transfer to commode with  min assist OT Short Term Goal 3 - Progress (Week 1): Progressing toward goal OT Short Term Goal 4 (Week 1): Patient will complete oral care with min assist OT Short Term Goal 4 - Progress (Week 1): Met OT Short Term Goal 5 (Week 1): Patient will tolerate passive stretch to LUE to 90* of shoulder flexion, and at least 20* of external rotation without significant increase in report of pain. OT Short Term Goal 5 - Progress (Week 1): Progressing toward goal Week 2:  OT Short Term Goal 1 (Week 2): Patient will transfer to commode with min assist OT Short Term Goal 1 - Progress (Week 2): Progressing toward goal OT Short Term Goal 2 (Week 2): Patient will tolerate passive stretch to LUE to 90* of shoulder flexion, and at least 20* of external rotation without significant increase in report of pain. OT Short Term Goal 2 - Progress (Week 2): Met OT Short Term Goal 3 (Week 2): Patient will demonstrate improved attention to L side by locating 2 items on L side of the sink with min questioning cues. OT Short Term Goal 3 - Progress (Week 2): Progressing toward goal Week 3:  OT Short Term Goal 1 (Week 3): LTG=STG 2/2 ELOS  Therapy Documentation Precautions:  Precautions Precautions: Fall Precaution Comments: L hemiplegia; painful LUE/shoulder; Required Braces or Orthoses: Splint/Cast Splint/Cast: WHO - Resting hand splint at night Restrictions Weight Bearing Restrictions: No     Therapy/Group: Individual Therapy  Valma Cava 03/03/2022, 12:22 PM

## 2022-03-03 NOTE — Progress Notes (Signed)
Occupational Therapy Session Note  Patient Details  Name: Jacob [name] MRN: [id number] Date of Birth: 10-04-1975  Today's Date: 03/03/2022 OT Individual Time: 1000-1030 OT Individual Time Calculation (min): 30 min    Short Term Goals: Week 2:  OT Short Term Goal 1 (Week 2): Patient will transfer to commode with min assist OT Short Term Goal 2 (Week 2): Patient will tolerate passive stretch to LUE to 90* of shoulder flexion, and at least 20* of external rotation without significant increase in report of pain. OT Short Term Goal 3 (Week 2): Patient will demonstrate improved attention to L side by locating 2 items on L side of the sink with min questioning cues.  Skilled Therapeutic Interventions/Progress Updates:    Pt greeted semi-reclined in bed with spouse present for family education. Discussed home set-up, transfers, PLOF, DME, and BADL tasks. Talked about some DME options for home including a tub bench with sliding feature or taking out glass in walk-in shower and putting up shower curtain, then using BSC in shower. Pt transferred to sitting EOB with bed rails and CGA. Pt was then able to thread R LE into pant leg after OT assist with L. Educated spouse on use of sling for shoulder protection and gentle ROM to improve [place] and tone. Sit<>stand at EOB with spouse assist and min A, then assist to get up pants. Pt then pivoted to wc with min A. Practiced sit<>stands at the sink with wife with min A. Pt with very flat affect today and said he was just not feeling it today. OT discussed how patient has been able to self- propel wc using standard wc. His wc with reclining back is very large and difficult for family to use at home. He does not need the reclining back and is MUCH more independent from a standard wc.   Therapy Documentation Precautions:  Precautions Precautions: Fall Precaution Comments: L hemiplegia; painful LUE/shoulder; Required Braces or Orthoses: Splint/Cast Splint/Cast:  WHO - Resting hand splint at night Restrictions Weight Bearing Restrictions: No Pain:  Pain in L shoulder, rest and repositioned   Therapy/Group: Individual Therapy  Valma Cava 03/03/2022, 10:34 AM

## 2022-03-03 NOTE — Progress Notes (Addendum)
PROGRESS NOTE   Subjective/Complaints:  Pt seen at bedside. No acute complaints. No events overnight. Endorses some LLE spasticity but not waking him up; he refuses PRAFO because it is uncomfortable but is wearing WHO.    ROS: Patient denies fever, rash, sore throat, blurred vision, dizziness, nausea, vomiting, diarrhea, cough, shortness of breath or chest pain, joint or back/neck pain.   Objective:   No results found. Recent Labs    03/01/22 0643  WBC 5.1  HGB 11.0*  HCT 33.0*  PLT 256    Recent Labs    03/01/22 0643  NA 141  K 3.8  CL 105  CO2 28  GLUCOSE 131*  BUN 8  CREATININE 0.62  CALCIUM 10.1     Intake/Output Summary (Last 24 hours) at 03/03/2022 1021 Last data filed at 03/03/2022 0736 Gross per 24 hour  Intake 717 ml  Output 500 ml  Net 217 ml         Physical Exam: Vital Signs Blood pressure (!) 134/93, pulse 60, temperature 98 F (36.7 C), resp. rate 16, height 6\' 1"  (1.854 m), weight 81 kg, SpO2 100 %.  General: No apparent distress HEENT: Large cranioplasty w/out swelling, stable.  +Strabismus, stable. oral mucosa pink and moist Neck: Supple without JVD or lymphadenopathy Heart: Reg rate and rhythm. No murmurs rubs or gallops Chest: CTA bilaterally without wheezes, rales, or rhonchi; no distress. Normal rate. Non-labored Abdomen: Soft, non-tender, non-distended, bowel sounds positive. Extremities: No clubbing, cyanosis, or edema.  Psych: Pt's affect is appropriate. Pt is cooperative Skin: Crani incision and abdominal incisions healed, staples s/p removal.  Neuro:  pt is alert and oriented x3. Left central 7, mild dysarthria. L neglect, improved.  No further verbosity or pressured speech. Insight: Poor insight into condition.  Tone: LUE contractures in finger flexors, MAS 2 wrist flexors, MAS 2 eblow flexors, MAS 2 elbow extensors. +WHO LLE MAS 2 knee flexors, MAS 1 ankle  plantarflexors - nonsustained clonus on jerk Musculoskeletal: shoulder subluxation 2 fingerbreadths, no TTP. +sling   Assessment/Plan: 1. Functional deficits which require 3+ hours per day of interdisciplinary therapy in a comprehensive inpatient rehab setting. Physiatrist is providing close team supervision and 24 hour management of active medical problems listed below. Physiatrist and rehab team continue to assess barriers to discharge/monitor patient progress toward functional and medical goals  Care Tool:  Bathing        Body parts bathed by helper: Right arm, Left arm, Chest, Abdomen, Front perineal area, Buttocks, Left lower leg, Right lower leg, Left upper leg, Right upper leg, Face     Bathing assist Assist Level: Dependent - Patient 0%     Upper Body Dressing/Undressing Upper body dressing   What is the patient wearing?: Pull over shirt    Upper body assist Assist Level: Maximal Assistance - Patient 25 - 49%    Lower Body Dressing/Undressing Lower body dressing      What is the patient wearing?: Pants, Incontinence brief     Lower body assist Assist for lower body dressing: Total Assistance - Patient < 25%     Toileting Toileting Toileting Activity did not occur Landscape architect and hygiene only): Refused  Toileting assist Assist for toileting: 2 Helpers     Transfers Chair/bed transfer  Transfers assist     Chair/bed transfer assist level: Moderate Assistance - Patient 50 - 74%     Locomotion Ambulation   Ambulation assist      Assist level: Maximal Assistance - Patient 25 - 49% Assistive device: Walker-rolling Max distance: 2 feet   Walk 10 feet activity   Assist  Walk 10 feet activity did not occur: Safety/medical concerns        Walk 50 feet activity   Assist Walk 50 feet with 2 turns activity did not occur: Safety/medical concerns         Walk 150 feet activity   Assist Walk 150 feet activity did not occur:  Safety/medical concerns         Walk 10 feet on uneven surface  activity   Assist Walk 10 feet on uneven surfaces activity did not occur: Safety/medical concerns         Wheelchair     Assist Is the patient using a wheelchair?: Yes Type of Wheelchair: Manual    Wheelchair assist level: Supervision/Verbal cueing Max wheelchair distance: 106 ft    Wheelchair 50 feet with 2 turns activity    Assist        Assist Level: Supervision/Verbal cueing   Wheelchair 150 feet activity     Assist      Assist Level: Moderate Assistance - Patient 50 - 74%   Blood pressure (!) 134/93, pulse 60, temperature 98 F (36.7 C), resp. rate 16, height 6\' 1"  (1.854 m), weight 81 kg, SpO2 100 %.  Medical Problem List and Plan: 1. Functional deficits secondary to debility after cranioplasty and C-Diff in setting of prior right MCA stroke, left hemiplegia             -patient may not yet shower             -ELOS/Goals: 12-14 days, Min A-mod assist with PT, OT, SLP             - resting WHO for LUE - PRAFO to maintain heel cord stretch; intermittent refusal, reinforce  -  CIR therapies ongoing including PT, OT, and SLP              - 10/6 - Of note, no further medication changes without discussing with wife             - 10/10 - SW looking into trading in reclining WC for standard WC to improve independence at discharge  2.  Antithrombotics: -DVT/anticoagulation:  Pharmaceutical: Eliquis             -antiplatelet therapy: none 3. Pain Management: Tylenol, oxycodone, hydrocodone, Robaxin as needed - significant pain from shoulder subluxation.              - 9/27 schedule Tylenol 1000 mg TID for pain management + Change pain regimen to oxycodone 2.5/5 mg PRN for moderate/severe pain; DC hydrocodone, DC robaxin              - For L shoulder sublux: Order OT taping and ROM, sling when OOB and not mobilizing shoulder, pillow support in bed 9/27 - improved 9/28               - 10/4  DC PRN Oxycodone due to no use in several days               - add baclofen 10 mg BID for  worsening LUE and LLE tone; monitor for cognitive effects -> inc 10 TID 10/4 - improved, consider increase; will discuss with wife today  4. Mood/Behavior/Sleep: LCSW to evaluate and provide emotional support             -antipsychotic agents: n/a             -depression: continue Lexapro 20 mg daily             -insomnia: continue melatonin; DC trazadone d/t no use             - Adding ritalin 5 mg BID to improve processing speed and attention 10/4 -> decrease to 2.5 BID for verbose, perseverative 10/5 -> DC 10/6 due to insomnia, impulsive behaviors - resolved  5. Neuropsych/cognition: This patient is capable of making decisions on his own behalf. 6. Skin/Wound Care: routine skin care checks             -monitor scalp and abdominal surgical incisions - staples removal 9/29 completed without complication - healing well   7. Fluids/Electrolytes/Nutrition:               -carb modified diet  -can dc IVF  9/24-Potassium 3.0--add supplement - 3.2 9/27-- inc supplement to 20 meq BID  - BMP 9/30 improved - stable 10/1    -BUN/Cr wnl    -encourage PO    Latest Ref Rng & Units 03/01/2022    6:43 AM 02/22/2022    6:22 AM 02/21/2022    7:10 AM  BMP  Glucose 70 - 99 mg/dL 015  615  379   BUN 6 - 20 mg/dL 8  8  8    Creatinine 0.61 - 1.24 mg/dL  4.32  7.61   Sodium 135 - 145 mmol/L 141  138  140   Potassium 3.5 - 5.1 mmol/L 3.8  3.9  3.9   Chloride 98 - 111 mmol/L 105  104  104   CO2 22 - 32 mmol/L 28  27  28    Calcium 8.9 - 10.3 mg/dL 4.70  9.4  9.2    BUN/Cr WNL, continue to monitor  8: DM: Hgb A1c = 8.1; on SSI             -consider restarting home Lantus and Novolog    -9/24 fair control. Hold scheduled insulin for now            - 9/26 - rare PM highs >200 ~2100; otherwise well controlled             - 9/29 - consistent use of SSI 3-9 units with increased needs at night; will start Lantus 3U  daily 9/30            - 10/2 Increase Lantus to 5U daily -> 8U daily 10/4 -> 10 U 10/6  10/8 Fairly well controlled, continue current regimen and monitor  Recent Labs    03/02/22 1646 03/02/22 2055 03/03/22 0606  GLUCAP 167* 216* 153*       9: Hypertension: monitor             -continue clonidine patch 0.3 mg weekly             -continue amlodipine 10 mg daily             -continue Lopressor 25 mg BID             -continue lisinopril 40 mg daily             -  10/8 well controlled, continue to monitor    03/03/2022    6:08 AM 03/02/2022    8:53 PM 03/02/2022    2:47 PM  Vitals with BMI  Systolic 134 108 883  Diastolic 93 68 73  Pulse 60 63 63     10: C. diff diarrhea: continue oral vanc, ended 9/30; discontinue Colace for now             - Now daily BM, formed             - Per infection control , continue PO vanc for duration, precautions must remain for 30 days.              - 10/1 - inquiring with infection control on grounds pass once finishes with abx - approved  -10/2 Reports stool more firm yesterday            - 10/4 hard Bms; adding Sennakot 1 tab nightly  -10/8 LBM yesterday-large mushy, not on any laxatives currently, continue to monitor  11. Incontinence of bowel and bladder - ongoing             - 10/4 pt reports sensate for both, can't hold; initiate timed toiletting Q2H - reinforced to nursing must get OOB 10/6 -> reinforced to patient OOB 10/9             - 10/6 DC sennakot QHS              - 10/10 - consider trial of ditropan, will discuss with wife today LOS: 17 days A FACE TO FACE EVALUATION WAS PERFORMED  Angelina Sheriff 03/03/2022, 10:21 AM

## 2022-03-03 NOTE — Plan of Care (Signed)
  Problem: Sit to Stand Goal: LTG:  Patient will perform sit to stand in prep for activites of daily living with assistance level (OT) Description: LTG:  Patient will perform sit to stand in prep for activites of daily living with assistance level (OT) Flowsheets (Taken 03/03/2022 1040) LTG: PT will perform sit to stand in prep for activites of daily living with assistance level: Minimal Assistance - Patient > 75% Note: Goal downgraded 10/10-ESD due to lack of progress   Problem: RH Grooming Goal: LTG Patient will perform grooming w/assist,cues/equip (OT) Description: LTG: Patient will perform grooming with assist, with/without cues using equipment (OT) Flowsheets (Taken 03/03/2022 1040) LTG: Pt will perform grooming with assistance level of: Minimal Assistance - Patient > 75% Note: Goal downgraded 10/10-ESD due to lack of progress   Problem: RH Toileting Goal: LTG Patient will perform toileting task (3/3 steps) with assistance level (OT) Description: LTG: Patient will perform toileting task (3/3 steps) with assistance level (OT)  Flowsheets (Taken 03/03/2022 1040) LTG: Pt will perform toileting task (3/3 steps) with assistance level: Maximal Assistance - Patient 25 - 49% Note: Goal downgraded 10/10-ESD due to lack of progress   Problem: RH Toilet Transfers Goal: LTG Patient will perform toilet transfers w/assist (OT) Description: LTG: Patient will perform toilet transfers with assist, with/without cues using equipment (OT) Flowsheets (Taken 03/03/2022 1040) LTG: Pt will perform toilet transfers with assistance level of: Moderate Assistance - Patient 50 - 74% Note: Goal downgraded 10/10-ESD due to lack of progress   Problem: RH Toilet Transfers Goal: LTG Patient will perform toilet transfers w/assist (OT) Description: LTG: Patient will perform toilet transfers with assist, with/without cues using equipment (OT) Flowsheets (Taken 03/03/2022 1040) LTG: Pt will perform toilet transfers  with assistance level of: Moderate Assistance - Patient 50 - 74% Note: Goal downgraded 10/10-ESD due to lack of progress   Problem: RH Tub/Shower Transfers Goal: LTG Patient will perform tub/shower transfers w/assist (OT) Description: LTG: Patient will perform tub/shower transfers with assist, with/without cues using equipment (OT) Outcome: Not Applicable Flowsheets (Taken 03/03/2022 1040) LTG: Pt will perform tub/shower stall transfers with assistance level of: (dc goal 10/10) -- Note: Dc goal10/10-ESD due to lack of progress

## 2022-03-03 NOTE — Patient Care Conference (Signed)
Inpatient RehabilitationTeam Conference and Plan of Care Update Date: 03/03/2022   Time: 10:31 AM    Patient Name: Jacob [name]      Medical Record Number: [id number]  Date of Birth: August 21, 1975 Sex: Male         Room/Bed: 4M04C/4M04C-01 Payor Info: Payor: [place] / Plan: BCBS COMM PPO / Product Type: *No Product type* /    Admit Date/Time:  02/14/2022  4:07 PM  Primary Diagnosis:  [facility] Problems: Principal Problem:   Debility Active Problems:   Type 2 diabetes mellitus with hyperglycemia, with long-term current use of insulin [facility])    Expected Discharge Date: Expected Discharge Date: 03/07/22  Team Members Present: Physician leading conference: Other (comment) (Dr. Durel Salts) Social Worker Present: Loralee Pacas, [place] Nurse Present: Other (comment) Tacy Learn, RN) PT Present: Canary Brim, PT OT Present: Cherylynn Ridges, OT SLP Present: Weston Anna, SLP PPS Coordinator present : Gunnar Fusi, SLP     Current Status/Progress Goal Weekly Team Focus  Bowel/Bladder   Intermittent continence of b/b. LBM: 10/9  Offer/Assist w/ toileting q 2-4 hours and as needed.  Assess toileting needs q shift & PRN   Swallow/Nutrition/ Hydration   Regular textures with thin liquids, supervision  Supervision  Family Education   ADL's   Min A sit<>stands, Min/mod A transfers depending on fatigue, Min A UB ADLs, Mod A LB ADLs  Min overall, mod A LB ADLS  Transfers, L UE NMR, L attention, pt/family training, dc planning   Mobility   minA bed mobility with bed features, minA lateral transfers, minA sit to stand, modA gait x50' with hemiwalker  minA  family ed, DC prep, improving efficiency and consistency of transfers, ambulation, bed mobility   Communication             Safety/Cognition/ Behavioral Observations  Min A  Supervision  Family Education   Pain   Pt denies pain.  Remain pain free  Assess pain q shift & PRN   Skin   Healed incision  to head/abdomen  Remain free from any skin breakdown  Assess skin q shift & PRN     Discharge Planning:  D/c to home with his wife. Pt will need to be intermittent supervision since his wife works during the day. Support from their older children when she is at work. PCS referral and home modification order faxed to [facility]. Fam edu scheduled for 10/9 9am-12pm with his wife.   Team Discussion: Debility. Incontinent B/B with timed toileting. Completed C-Diff medication. Remain on enteric precautions x 30 days. Denies pain. Incision to ABD and head are CDI. Skin tear to knee is scabbed. Wife request to be notified prior to any medical/medication changes. Therapies to downgrade goals as appropriate. (Min assist) Patient on target to meet rehab goals: Goals to be downgraded  *See Care Plan and progress notes for long and short-term goals.   Revisions to Treatment Plan:  Medication adjustments, exchange wheelchair?, monitor labs  Teaching Needs: Medications, safety, skin care, gait/transfer training, etc.  Current Barriers to Discharge: Decreased caregiver support, Home enviroment access/layout, and Incontinence  Possible Resolutions to Barriers: Family education, order recommended DME     Medical Summary Current Status: stable  Barriers to Discharge: Behavior;Decreased family/caregiver support;Home enviroment access/layout;Incontinence;Insurance for SNF coverage;Medication compliance  Barriers to Discharge Comments: spasticity, incontinence Possible Resolutions to [employer] Focus: medication adjustments   Continued Need for Acute Rehabilitation Level of Care: The patient requires daily medical management by  a physician with specialized training in physical medicine and rehabilitation for the following reasons: Direction of a multidisciplinary physical rehabilitation program to maximize functional independence : Yes Medical management of patient stability for increased  activity during participation in an intensive rehabilitation regime.: Yes Analysis of laboratory values and/or radiology reports with any subsequent need for medication adjustment and/or medical intervention. : Yes   I attest that I was present, lead the team conference, and concur with the assessment and plan of the team.   Ernest Pine 03/03/2022, 2:07 PM

## 2022-03-03 NOTE — Progress Notes (Signed)
Patient ID: Jacob [name], male   DOB: 09-02-1975, 46 y.o.   MRN: [id number]  SW spoke with pt wife to provide updates from team conference, and d/c date remains 10/14. SW discussed HHA preference. She would like HHA list. SW discussed insurance remains BCBS commercial as primary versus [employer]. She reports the BCBS commercial plan is a Microbiologist that she did not want to continue to pay due to it being expensive. She was encouraged to make contact with her insurance to discuss terminating policy. SW to order DME- hemiwalker, R lap tray and w/c.   SW ordered DME with [place].   *SW made efforts to meet with pt to give updates from team conference. Pt not in room. SW left HHA list in room.   Loralee Pacas, MSW, [place] Office: [phone] Cell: [phone] Fax: [phone]

## 2022-03-03 NOTE — Progress Notes (Signed)
Speech Language Pathology Daily Session Note  Patient Details  Name: Jacob [name] MRN: [id number] Date of Birth: 07/29/1975  Today's Date: 03/03/2022 SLP Individual Time: 0935-1000 SLP Individual Time Calculation (min): 25 min  Short Term Goals: Week 3: SLP Short Term Goal 1 (Week 3): STGs=LTGs due to ELOS  Skilled Therapeutic Interventions: Skilled treatment session focused on family education with the patient and his wife. SLP provided education regarding patient's current swallowing function, diet recommendations, appropriate textures, swallowing compensatory strategies and medication administration. SLP also provided education regarding the importance of 24 hour supervision and strategies to utilize at home to maximize attention, recall, memory, problem solving and overall safety. Patient and wife verbalized understanding and handouts were given to reinforce information. Patient left upright in bed with alarm on and all needs within reach. Continue with current plan of care.      Pain No/Denies Pain   Therapy/Group: Individual Therapy  Lejend Dalby 03/03/2022, 10:27 AM

## 2022-03-04 LAB — GLUCOSE, CAPILLARY
Glucose-Capillary: 115 mg/dL — ABNORMAL HIGH (ref 70–99)
Glucose-Capillary: 186 mg/dL — ABNORMAL HIGH (ref 70–99)
Glucose-Capillary: 158 mg/dL — ABNORMAL HIGH (ref 70–99)
Glucose-Capillary: 189 mg/dL — ABNORMAL HIGH (ref 70–99)

## 2022-03-04 MED ORDER — OXYBUTYNIN CHLORIDE 5 MG PO TABS
2.5000 mg | ORAL_TABLET | Freq: Three times a day (TID) | ORAL | Status: DC
  Administered 2022-03-04 – 2022-03-05 (×4): 2.5 mg via ORAL
  Filled 2022-03-04 (×7): qty 0.5
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
## 2022-03-04 NOTE — Progress Notes (Signed)
PROGRESS NOTE   Subjective/Complaints:  Pt seen at bedside. No acute complaints.   Discussed with him this AM ongoing issues with urinary continence, poor results of timed voiding. Discussed starting ditropan for urgency to see if this improves his ability to hold urine between timed toiletting. Patient initially overwhelmed saying "it's always something!", but eventually agreeable to trialling medicaiton today and re-evaluating tomorrow. Discussed medication change with wife, who is also agreeable.  Neurosurgery followed up yesterday, no changes, OP f/u.   ROS: Patient denies fever, rash, sore throat, blurred vision, dizziness, nausea, vomiting, diarrhea, cough, shortness of breath or chest pain, joint or back/neck pain.   Objective:   No results found. No results for input(s): "WBC", "HGB", "HCT", "PLT" in the last 72 hours.  No results for input(s): "NA", "K", "CL", "CO2", "GLUCOSE", "BUN", "CREATININE", "CALCIUM" in the last 72 hours.   Intake/Output Summary (Last 24 hours) at 03/04/2022 1114 Last data filed at 03/04/2022 1101 Gross per 24 hour  Intake 829 ml  Output 1500 ml  Net -671 ml         Physical Exam: Vital Signs Blood pressure (!) 120/92, pulse 65, temperature 98.3 F (36.8 C), resp. rate 18, height 6\' 1"  (1.854 m), weight 81 kg, SpO2 100 %.  General: No apparent distress HEENT: Large cranioplasty - stable, healed.  +Strabismus, stable. oral mucosa pink and moist Neck: Supple without JVD or lymphadenopathy Heart: Reg rate and rhythm. No murmurs rubs or gallops Chest: CTA bilaterally without wheezes, rales, or rhonchi; no distress. Normal rate. Non-labored Abdomen: Soft, non-tender, non-distended, bowel sounds positive. Extremities: No clubbing, cyanosis, or edema.  Psych: Pt's affect is appropriate. Pt is cooperative Skin: Crani incision and abdominal incisions healed, staples s/p removal.  Neuro:   pt is alert and oriented x3. Left central 7, mild dysarthria. L neglect, improved.  No further verbosity or pressured speech. Insight: Poor insight into condition.  MSK; L shoulder out of sling, no appreciable movement. +LLE AFO.   PE from prior encounter: Tone: LUE contractures in finger flexors, MAS 2 wrist flexors, MAS 2 eblow flexors, MAS 2 elbow extensors. +WHO LLE MAS 2 knee flexors, MAS 1 ankle plantarflexors - nonsustained clonus on jerk Musculoskeletal: shoulder subluxation 2 fingerbreadths, no TTP. +sling   Assessment/Plan: 1. Functional deficits which require 3+ hours per day of interdisciplinary therapy in a comprehensive inpatient rehab setting. Physiatrist is providing close team supervision and 24 hour management of active medical problems listed below. Physiatrist and rehab team continue to assess barriers to discharge/monitor patient progress toward functional and medical goals  Care Tool:  Bathing        Body parts bathed by helper: Right arm, Left arm, Chest, Abdomen, Front perineal area, Buttocks, Left lower leg, Right lower leg, Left upper leg, Right upper leg, Face     Bathing assist Assist Level: Dependent - Patient 0%     Upper Body Dressing/Undressing Upper body dressing   What is the patient wearing?: Pull over shirt    Upper body assist Assist Level: Maximal Assistance - Patient 25 - 49%    Lower Body Dressing/Undressing Lower body dressing      What is the patient  wearing?: Pants, Incontinence brief     Lower body assist Assist for lower body dressing: Total Assistance - Patient < 25%     Toileting Toileting Toileting Activity did not occur Press photographer and hygiene only): Refused  Toileting assist Assist for toileting: 2 Helpers     Transfers Chair/bed transfer  Transfers assist     Chair/bed transfer assist level: Moderate Assistance - Patient 50 - 74%     Locomotion Ambulation   Ambulation assist      Assist  level: Maximal Assistance - Patient 25 - 49% Assistive device: Walker-rolling Max distance: 2 feet   Walk 10 feet activity   Assist  Walk 10 feet activity did not occur: Safety/medical concerns        Walk 50 feet activity   Assist Walk 50 feet with 2 turns activity did not occur: Safety/medical concerns         Walk 150 feet activity   Assist Walk 150 feet activity did not occur: Safety/medical concerns         Walk 10 feet on uneven surface  activity   Assist Walk 10 feet on uneven surfaces activity did not occur: Safety/medical concerns         Wheelchair     Assist Is the patient using a wheelchair?: Yes Type of Wheelchair: Manual    Wheelchair assist level: Supervision/Verbal cueing Max wheelchair distance: 106 ft    Wheelchair 50 feet with 2 turns activity    Assist        Assist Level: Supervision/Verbal cueing   Wheelchair 150 feet activity     Assist      Assist Level: Moderate Assistance - Patient 50 - 74%   Blood pressure (!) 120/92, pulse 65, temperature 98.3 F (36.8 C), resp. rate 18, height 6\' 1"  (1.854 m), weight 81 kg, SpO2 100 %.  Medical Problem List and Plan: 1. Functional deficits secondary to debility after cranioplasty and C-Diff in setting of prior right MCA stroke, left hemiplegia             -patient may not yet shower             -ELOS/Goals: 12-14 days, Min A-mod assist with PT, OT, SLP             - resting WHO for LUE - PRAFO to maintain heel cord stretch; intermittent refusal, reinforce  -  CIR therapies ongoing including PT, OT, and SLP              - 10/6 - Of note, no further medication changes without discussing with wife             - 10/10 - SW looking into trading in reclining WC for standard WC to improve independence at discharge  2.  Antithrombotics: -DVT/anticoagulation:  Pharmaceutical: Eliquis             -antiplatelet therapy: none 3. Pain Management: Tylenol, oxycodone,  hydrocodone, Robaxin as needed - significant pain from shoulder subluxation.              - 9/27 schedule Tylenol 1000 mg TID for pain management + Change pain regimen to oxycodone 2.5/5 mg PRN for moderate/severe pain; DC hydrocodone, DC robaxin              - For L shoulder sublux: Order OT taping and ROM, sling when OOB and not mobilizing shoulder, pillow support in bed 9/27 - improved 9/28               -  10/4 DC PRN Oxycodone due to no use in several days               - add baclofen 10 mg BID for worsening LUE and LLE tone; monitor for cognitive effects -> inc 10 TID 10/4 - improved; will hold off on increase d/t cognitive deficits. May consider switch to Dantrolene as OP.   4. Mood/Behavior/Sleep: LCSW to evaluate and provide emotional support             -antipsychotic agents: n/a             -depression: continue Lexapro 20 mg daily             -insomnia: continue melatonin; DC trazadone d/t no use             - Adding ritalin 5 mg BID to improve processing speed and attention 10/4 -> decrease to 2.5 BID for verbose, perseverative 10/5 -> DC 10/6 due to insomnia, impulsive behaviors - resolved  5. Neuropsych/cognition: This patient is capable of making decisions on his own behalf. 6. Skin/Wound Care: routine skin care checks             -monitor scalp and abdominal surgical incisions - staples removal 9/29 completed without complication - healing well   7. Fluids/Electrolytes/Nutrition:               -carb modified diet  -can dc IVF  9/24-Potassium 3.0--add supplement - 3.2 9/27-- inc supplement to 20 meq BID  - BMP 9/30 improved - stable 10/1    -BUN/Cr wnl    -encourage PO    Latest Ref Rng & Units 03/01/2022    6:43 AM 02/22/2022    6:22 AM 02/21/2022    7:10 AM  BMP  Glucose 70 - 99 mg/dL 131  177  200   BUN 6 - 20 mg/dL 8  8  8    Creatinine 0.61 - 1.24 mg/dL 0.62  0.63  0.51   Sodium 135 - 145 mmol/L 141  138  140   Potassium 3.5 - 5.1 mmol/L 3.8  3.9  3.9   Chloride 98 -  111 mmol/L 105  104  104   CO2 22 - 32 mmol/L 28  27  28    Calcium 8.9 - 10.3 mg/dL 10.1  9.4  9.2    10/8 BUN/Cr WNL, continue to monitor  8: DM: Hgb A1c = 8.1; on SSI             -consider restarting home Lantus and Novolog    -9/24 fair control. Hold scheduled insulin for now            - 9/26 - rare PM highs >200 ~2100; otherwise well controlled             - 9/29 - consistent use of SSI 3-9 units with increased needs at night; will start Lantus 3U daily 9/30            - 10/2 Increase Lantus to 5U daily -> 8U daily 10/4 -> 10 U 10/6  10/8 Fairly well controlled, continue current regimen and monitor  Recent Labs    03/03/22 1649 03/03/22 2121 03/04/22 0609  GLUCAP 101* 187* 115*       9: Hypertension: monitor             -continue clonidine patch 0.3 mg weekly             -continue amlodipine 10 mg daily             -  continue Lopressor 25 mg BID             -continue lisinopril 40 mg daily             - 10/8 well controlled, continue to monitor    03/04/2022    9:14 AM 03/04/2022    5:01 AM 03/03/2022    8:19 PM  Vitals with BMI  Systolic 120 132 222  Diastolic 92 89 77  Pulse 65 56 64     10: C. diff diarrhea: continue oral vanc, ended 9/30; discontinue Colace for now             - Now daily BM, formed             - Per infection control , continue PO vanc for duration, precautions must remain for 30 days.              - 10/1 - inquiring with infection control on grounds pass once finishes with abx - approved  -10/2 Reports stool more firm yesterday            - 10/4 hard Bms; adding Sennakot 1 tab nightly  -10/8 LBM yesterday-large mushy, not on any laxatives currently, continue to monitor  11. Incontinence of bowel and bladder - ongoing             - 10/4 pt reports sensate for both, can't hold; initiate timed toiletting Q2H - reinforced to nursing must get OOB 10/6 -> reinforced to patient OOB 10/9             - 10/6 DC sennakot QHS              - 10/11 -  trial of ditropan TID, with Q6H bladder scans. Will re-evaluate in AM. Approved by wife.   LOS: 18 days A FACE TO FACE EVALUATION WAS PERFORMED  Angelina Sheriff 03/04/2022, 11:14 AM

## 2022-03-04 NOTE — Progress Notes (Signed)
Physical Therapy Session Note  Patient Details  Name: Jacob [name] MRN: [id number] Date of Birth: [birth year]  Today's Date: 03/04/2022 PT Individual Time: 0803-0900 PT Individual Time Calculation (min): 57 min   Short Term Goals: Week 2:  PT Short Term Goal 1 (Week 2): Pt will perform bed mobility with minA consistently. PT Short Term Goal 2 (Week 2): Pt will perform basic transfers with minA >50% of time. PT Short Term Goal 3 (Week 2): Pt will ambulate 100' with LRAD and modA.  Skilled Therapeutic Interventions/Progress Updates:      Therapy Documentation Precautions:  Precautions Precautions: Fall Precaution Comments: L hemiplegia; painful LUE/shoulder; Required Braces or Orthoses: Splint/Cast Splint/Cast: WHO - Resting hand splint at night Restrictions Weight Bearing Restrictions: No  Pt received semi-reclined in bed and declines pain. Pt agreeable to PT session with emphasis on community integration, transfers, and w/c propulsion via hemi-technique. Pt requires CGA with supine to sit edge of bed with use of bed features and increase time. PT dependently performed upper/lower body dressing for time management and donned L UE sling. Pt requires min/mod A with sit to stand while therapist raised up pants. Pt requires mod A with stand pivot transfer to w/c and propelled w/c ~30 ft and transported remaining distance to outside [facility] atrium for community integration and to increase mood. Pt propelled w/c via hemi-technique with min A on unlevel terrain ~100 ft, pt tends to list left. Pt practiced 5 sit to stands with hemiwalker and min A. PT provided tactile cueing at gluteal and quadriceps muscles to encourage upright posture. Pt transported remaining distance by w/c to room and left seated in recliner at bedside with alarm on and all needs within reach.   Therapy/Group: Individual Therapy  Verl Dicker Verl Dicker PT, DPT  03/04/2022, 7:41 AM

## 2022-03-04 NOTE — Progress Notes (Signed)
Subjective: The patient is alert and pleasant.  He is in no apparent distress.  He looks well.  He is pleased with his progress.  Objective: Vital signs in last 24 hours: Temp:  [98.3 F (36.8 C)-98.7 F (37.1 C)] 98.3 F (36.8 C) (10/11 0501) Pulse Rate:  [56-68] 56 (10/11 0501) Resp:  [17-18] 18 (10/11 0501) BP: (119-132)/(77-89) 132/89 (10/11 0501) SpO2:  [100 %] 100 % (10/11 0501) Estimated body mass index is 23.56 kg/m as calculated from the following:   Height as of this encounter: 6\' 1"  (1.854 m).   Weight as of this encounter: 81 kg.   Intake/Output from previous day: 10/10 0701 - 10/11 0700 In: 711 [P.O.:711] Out: 1000 [Urine:1000] Intake/Output this shift: No intake/output data recorded.  Physical exam the patient is alert and pleasant.  He is left hemiplegic.  His right cranioplasty incision is healing well.  Lab Results: No results for input(s): "WBC", "HGB", "HCT", "PLT" in the last 72 hours. BMET No results for input(s): "NA", "K", "CL", "CO2", "GLUCOSE", "BUN", "CREATININE", "CALCIUM" in the last 72 hours.  Studies/Results: No results found.  Assessment/Plan: Status post cranioplasty: The patient is doing well.  Please have him follow-up with me in the office in a few months.  LOS: 18 days     [name] 03/04/2022, 8:26 AM

## 2022-03-04 NOTE — Progress Notes (Signed)
Speech Language Pathology Daily Session Note  Patient Details  Name: [name] MRN: [id number] Date of Birth: May 22, 1976  Today's Date: 03/04/2022 SLP Individual Time: 3491-7915 SLP Individual Time Calculation (min): 27 min  Short Term Goals: Week 3: SLP Short Term Goal 1 (Week 3): STGs=LTGs due to ELOS  Skilled Therapeutic Interventions: Skilled treatment session focused on cognitive goals. Upon arrival, patient was awake while upright in the wheelchair. SLP re-administered the [facility] Mental Status Examination (SLUMS). Patient scored  27/30 points with a score of 27 or above considered normal. Patient's overall score improved by 6 points since initial evaluation. Patient reported that he was pleased with his cognitive progress with SLP educating on ongoing safety issues regarding attention and impulsivity. Patient verbalized understanding. Patient left upright in the recliner with alarm on and all needs within reach. Continue with current plan of care.       Pain No/Denies Pain   Therapy/Group: Individual Therapy  Hurley Sobel 03/04/2022, 3:46 PM

## 2022-03-04 NOTE — Progress Notes (Signed)
Physical Therapy Session Note  Patient Details  Name: Jacob [name] MRN: [id number] Date of Birth: [birth year]  Today's Date: 03/04/2022 PT Individual Time: 0086-7619 PT Individual Time Calculation (min): 56 min   Short Term Goals: Week 2:  PT Short Term Goal 1 (Week 2): Pt will perform bed mobility with minA consistently. PT Short Term Goal 2 (Week 2): Pt will perform basic transfers with minA >50% of time. PT Short Term Goal 3 (Week 2): Pt will ambulate 100' with LRAD and modA.  Skilled Therapeutic Interventions/Progress Updates:     Pt received seated in recliner and agrees to therapy. No complaint of pain. Sit to stand and stand pivot to [facility] with modA and cues for sequencing and hand placement. WC transport to gym. Pt performs standing activity in parallel bars for NMR of L hemibody. Pt stands in parallel bars holding onto bar with R hand. Pt able to complete sit to stand without physical assistance, with cues for body mechanics and sequencing. Pt then performs toe taps with R lower extremity on 3 inch step to promote lateral weight shifting into L lower extremity. PT provides modA for blocking of L knee, as well as assistance to maintain upright trunk to improve posture and body mechanics. Pt completes 3x10 with seated rest breaks. Pt noted to be relying heavily on R parallel bar, so PT cues for pt to attempt HHA to promote increase L lateral weight shifting. Pt only able to complete x3 reps with HHA and PT providing heavy mod to maxA for balance and stabilization of L knee.  Pt ambulates in hallway with R handrail and +2 for WC follow. PT provides modA to facilitate progression of L lower extremity during swing phase, blocking L knee during stance phase, and promoting upright posture for optimal body mechanics and to allow for optimal length tension relationship of L hip flexors. Pt is able to initiate swing with L lower extremity but typically requires manual assistance to perform functional  step length. Pt ambulates 2x30' with additional 2x15' backward, with cues for L knee flexion and lateral weight shifting.   Pt completes x4 6" steps with R handrail and minA/modA. Pt is able to clear each step with L lower extremity and does not require manual assistance to move L foot onto step. Pt is also able to flex L hip enough to descend steps without PT providing manual assistance of progression of L leg.   WC transport back to room. Stand pivot to recliner with modA. Left seated with all needs within reach.  Therapy Documentation Precautions:  Precautions Precautions: Fall Precaution Comments: L hemiplegia; painful LUE/shoulder; Required Braces or Orthoses: Splint/Cast Splint/Cast: WHO - Resting hand splint at night Restrictions Weight Bearing Restrictions: No    Therapy/Group: Individual Therapy  Breck Coons, PT, DPT 03/04/2022, 5:03 PM

## 2022-03-05 LAB — GLUCOSE, CAPILLARY
Glucose-Capillary: 162 mg/dL — ABNORMAL HIGH (ref 70–99)
Glucose-Capillary: 201 mg/dL — ABNORMAL HIGH (ref 70–99)
Glucose-Capillary: 131 mg/dL — ABNORMAL HIGH (ref 70–99)
Glucose-Capillary: 215 mg/dL — ABNORMAL HIGH (ref 70–99)

## 2022-03-05 NOTE — Progress Notes (Signed)
Occupational Therapy Discharge Summary  Patient Details  Name: Jacob [name] MRN: [id number] Date of Birth: September 03, 1975  Date of Discharge from [place] 13, 2023   Patient has met 15 of 16 long term goals due to improved activity tolerance, improved balance, postural control, ability to compensate for deficits, functional use of  LEFT upper and LEFT lower extremity, improved attention, and improved awareness.  Patient to discharge at overall Mod Assist level.  Patient's care partner is independent to provide the necessary physical and cognitive assistance at discharge.    Reasons goals not met: Patient requires mod A for bathing tasks.  Recommendation:  Patient will benefit from ongoing skilled OT services in home health setting to continue to advance functional skills in the area of BADL, Reduce care partner burden, and functional use of L UE .  Equipment: Standard wheelchair, hemi walker  Reasons for discharge: treatment goals met and discharge from hospital  Patient/family agrees with progress made and goals achieved: Yes  OT Discharge Precautions/Restrictions  Precautions Precautions: Fall Precaution Comments: L hemiplegia; painful LUE/shoulder; Restrictions Weight Bearing Restrictions: No   03/06/22 0936  CareTool - Eating  Eating Assist Level Supervision/Verbal cueing  CareTool - Oral Care  Oral Care Assist Level Supervision/Verbal cueing  CareTool - Bathing  Body parts bathed by helper Right arm;Left arm;Chest;Abdomen;Front perineal area;Buttocks;Left lower leg;Right lower leg;Left upper leg;Right upper leg;Face  Assist Level Moderate Assistance - Patient 50 - 74%  CareTool- Upper Body Dressing (including orthotics  What is the patient wearing? Pull over shirt  Assist Level Minimal Assistance - Patient > 75%  CareTool - Lower Body Dressing (excluding footwear)  What is the patient wearing? Pants  Assist for lower body dressing Moderate Assistance - Patient  50 - 74%  CareTool - Putting on/Taking off footwear  What is the patient wearing? Socks;AFO;Shoes  Assist for footwear Moderate Assistance - Patient 50 - 74%    03/06/22 0937  CareTool - Toilet Transfers  Assist Level Minimal Assistance - Patient > 75%     Vital Signs Therapy Vitals Temp: 98.4 F (36.9 C) Pulse Rate: 63 Resp: 16 BP: 121/76 Patient Position (if appropriate): Lying Oxygen Therapy SpO2: 100 % O2 Device: Room Air Pain   ADL ADL Eating: Set up, Supervision/safety Where Assessed-Eating: Wheelchair Grooming: Setup, Supervision/safety Where Assessed-Grooming: Sitting at sink Upper Body Bathing: Minimal assistance Where Assessed-Upper Body Bathing: Bed level Lower Body Bathing: Moderate assistance Where Assessed-Lower Body Bathing: Bed level Upper Body Dressing: Minimal assistance Where Assessed-Upper Body Dressing: Wheelchair Lower Body Dressing: Moderate assistance Where Assessed-Lower Body Dressing: Chair Toileting: Maximal assistance Where Assessed-Toileting: Bed level Toilet Transfer: Moderate assistance, Minimal assistance Toilet Transfer Method: Unable to assess Tub/Shower Transfer: Unable to assess Tub/Shower Transfer Method: Unable to assess Social research officer, government: Unable to assess Social research officer, government Method: Unable to assess [employer]: Shower seat with back ADL Comments: Patient reports he has shower seat at home - but he has not been able to get into shower Vision Eye Alignment: Within Functional Limits Ocular Range of Motion: Impaired-to be further tested in functional context (has movement in all quadrants - yet lacks smooth controlled eye movement) Alignment/Gaze Preference: Gaze right Tracking/Visual Pursuits: Decreased smoothness of vertical tracking;Decreased smoothness of horizontal tracking;Requires cues, head turns, or add eye shifts to track;Unable to hold eye position out of midline Saccades: Additional head turns  occurred during testing;Decreased speed of saccadic movement;Additional eye shifts occurred during testing Perception  Perception: Impaired Inattention/Neglect: Does not attend to left  side of body;Does not attend to left visual field Spatial Orientation: Poor midline awareness, posterior bias in sitting Praxis Praxis: Impaired Praxis Impairment Details: Initiation;Motor planning Cognition   03/06/22 0940  Cognition  Overall Cognitive Status Impaired/Different from baseline  Arousal/Alertness Awake/alert  Orientation Level Person;Place;Situation  Person Oriented  Place Oriented  Situation Oriented  Memory Impaired  Memory Impairment Storage deficit  Attention Sustained  Sustained Attention Impaired  Awareness Impairment Emergent impairment  Problem Solving Impaired  Problem Solving Impairment Functional basic;Verbal basic  Behaviors Other (comment)  Brief Interview for Mental Status (BIMS)  Repetition of Three Words (First Attempt) 3  Temporal Orientation: Year Correct  Temporal Orientation: Month Accurate within 5 days  Temporal Orientation: Day Correct  Recall: "Sock" Yes, no cue required  Recall: "Blue" Yes, no cue required  Recall: "Bed" Yes, no cue required  BIMS Summary Score 15   Sensation Sensation Light Touch: Impaired Detail Light Touch Impaired Details: Impaired LUE;Impaired LLE Hot/Cold: Appears Intact Proprioception: Impaired by gross assessment Coordination Gross Motor Movements are Fluid and Coordinated: No Fine Motor Movements are Fluid and Coordinated: No Coordination and Movement Description: dense L hemiplegia Motor  Motor Motor: Hemiplegia;Abnormal tone;Motor apraxia;Abnormal postural alignment and control Mobility  Bed Mobility Bed Mobility: Rolling Left;Supine to Sit Rolling Left: Minimal Assistance - Patient > 75% Supine to Sit: Supervision/Verbal cueing Transfers Sit to Stand: Minimal Assistance - Patient > 75% Stand to Sit: Minimal  Assistance - Patient > 75%  Trunk/Postural Assessment  Cervical Assessment Cervical Assessment: Exceptions to [facility] (forward head) Thoracic Assessment Thoracic Assessment: Exceptions to [facility] (flattened anterior chest wall) Lumbar Assessment Lumbar Assessment: Exceptions to [facility] (posterior pelvis) Postural Control Postural Control: Deficits on evaluation (delayed / decreased)  Balance Balance Balance Assessed: Yes Static Sitting Balance Static Sitting - Balance Support: Feet supported;Right upper extremity supported Static Sitting - Level of Assistance: 5: Stand by assistance Dynamic Sitting Balance Dynamic Sitting - Balance Support: Feet supported;Right upper extremity supported Dynamic Sitting - Level of Assistance: 5: Stand by assistance Static Standing Balance Static Standing - Balance Support: During functional activity;Right upper extremity supported;Left upper extremity supported Static Standing - Level of Assistance: 5: Stand by assistance;4: Min assist Dynamic Standing Balance Dynamic Standing - Balance Support: During functional activity Dynamic Standing - Level of Assistance: 4: Min assist Extremity/Trunk Assessment RUE Assessment RUE Assessment: Within Functional Limits LUE Assessment LUE Assessment: Exceptions to [facility] Passive Range of Motion (PROM) Comments: Painful left shoulder - inferior / anterior subluxation, lacks external rotation Active Range of Motion (AROM) Comments: None noted on OT eval General Strength Comments: NT LUE Body System: Neuro Brunstrum levels for arm and hand: Arm;Hand Brunstrum level for arm:  (Patient with muscle shortening in shoulder, elbow and digits) Brunstrum level for hand:  (Not flaccid - increased tension maintained in digits - does not have full PROM into flex/ext)   Daneen Schick Doe 03/05/2022, 3:44 PM

## 2022-03-05 NOTE — Progress Notes (Addendum)
Patient ID: Jacob [name], male   DOB: May 04, 1976, 47 y.o.   MRN: [id number]  SW returned phone call to pt wife Jacob [name] who had questions about HHAs and if  the list provided includes HHAs accepted by [facility]. SW informed will send out referral to preferred agency. [facility]ers [facility]. SW shared if agency is unable to accept, will find an agency willing to accept referral.   SW sent HHPT/OT/SLP referral to [facility] and waiting on follow-up. SW resent DME orders to [facility] due to diagnosis code issues.  *referral denied as not in network.   SW sent referral to [facility]. *updates report insurance will only approve 3 sessions and one session being the evaluation and then pt will be discharged. SW inquired if number of session were increased for [facility] or outpatient only, and waiting on follow-up. SW shared with medical team and waiting on updates on best plan of care.  -[facility]  1604-SW called pt wife to inform on above. She is ok with outpatient therapies at [facility]. SW will provide updates. She also states [facility] is going to switch out his current w/c for the new w/c as well.   Jacob [name], MSW, [place] Office: [phone] Cell: [phone] Fax: [phone]

## 2022-03-05 NOTE — Progress Notes (Signed)
Occupational Therapy Session Note  Patient Details  Name: Jacob [name] MRN: [id number] Date of Birth: 29-May-1975  Today's Date: 03/05/2022 OT Individual Time: 1105-1210 OT Individual Time Calculation (min): 65 min    Short Term Goals: Week 2:  OT Short Term Goal 1 (Week 2): Patient will transfer to commode with min assist OT Short Term Goal 1 - Progress (Week 2): Progressing toward goal OT Short Term Goal 2 (Week 2): Patient will tolerate passive stretch to LUE to 90* of shoulder flexion, and at least 20* of external rotation without significant increase in report of pain. OT Short Term Goal 2 - Progress (Week 2): Met OT Short Term Goal 3 (Week 2): Patient will demonstrate improved attention to L side by locating 2 items on L side of the sink with min questioning cues. OT Short Term Goal 3 - Progress (Week 2): Progressing toward goal  Skilled Therapeutic Interventions/Progress Updates:    Pt greeted semi-reclined in bed and agreeable to OT treatment session. With encouragement, pt agreeable to shower today. Pt wanted to use the [place]. OT placed BSC in shower for shower seat. Sit<>stands in [place] with supervision. Pt doffed shirt with supervision and increased time. OT assist to doff lower body clothing. Pt noted to be incontinent of BM when brief removed with more BM in shower. Used cut out of BSC for pt to wash peri-area. Pt stood with grab bar and min A for OT to wash buttocks and posterior peri-area thoroughly due to BM. OT educated on use of LH sponge to increase access to lower body. Stedy transfer out of shower with supervision for stand. Dressing tasks completed at EOB with pt able to thread R LE into pants after OT assist with L. Min A sit<>stand with hemi-walker, then pt able to help pull up pants with min A. OT applied kinesiotape to L shoulder for sublux support, then pt worked on [place]. He needed set-up A for orientation of shirt, but could then recall hemi techniques and  needed min A. Pt returned to supine with min A and left semi-reclined with visitor present, call bell in reach, and needs met.   Therapy Documentation Precautions:  Precautions Precautions: Fall Precaution Comments: L hemiplegia; painful LUE/shoulder; Required Braces or Orthoses: Splint/Cast Splint/Cast: WHO - Resting hand splint at night Restrictions Weight Bearing Restrictions: No   Pain: Pain Assessment Pain Scale: 0-10 Pain Score: 0-No pain   Therapy/Group: Individual Therapy  Valma Cava 03/05/2022, 12:16 PM

## 2022-03-05 NOTE — Plan of Care (Signed)
  Problem: RH Bathing Goal: LTG Patient will bathe all body parts with assist levels (OT) Description: LTG: Patient will bathe all body parts with assist levels (OT) Outcome: Not Met (add Reason) Note: Pt needs mod A for bathing tasks-ESD 10/12

## 2022-03-05 NOTE — Progress Notes (Signed)
Physical Therapy Discharge Summary  Patient Details  Name: Jacob [name] MRN: [id number] Date of Birth: May 29, 1975  Date of Discharge from PT service:March 06, 2022  Today's Date: 03/06/2022    Patient has met 6 of 11 long term goals due to improved activity tolerance, improved balance, improved postural control, increased strength, improved attention, and improved awareness.  Patient to discharge at an ambulatory level [place].   Patient's care partner is independent to provide the necessary physical and cognitive assistance at discharge.  Reasons goals not met: additional assist continued to be required due to L hemiplegia and poor L attention   Recommendation:  Patient will benefit from ongoing skilled PT services in outpatient setting to continue to advance safe functional mobility, address ongoing impairments in L hemibody NMR, strength, balance, transfers, ambulation, and minimize fall risk.  Equipment: Recommended 18" x 18" manual WC  Reasons for discharge: discharge from hospital  Patient/family agrees with progress made and goals achieved: Yes  PT Discharge Precautions/Restrictions Precautions Precautions: Fall Precaution Comments: L hemiplegia; painful LUE/shoulder; Restrictions Weight Bearing Restrictions: No Vital Signs Therapy Vitals Temp: 98.4 F (36.9 C) Pulse Rate: 63 Resp: 16 BP: 121/76 Patient Position (if appropriate): Lying Oxygen Therapy SpO2: 100 % O2 Device: Room Air Pain   Pain Interference   Vision/Perception  Vision - History Ability to See in Adequate Light: 3 Highly impaired Vision - Assessment Tracking/Visual Pursuits: Decreased smoothness of vertical tracking;Decreased smoothness of horizontal tracking;Requires cues, head turns, or add eye shifts to track;Unable to hold eye position out of midline Saccades: Additional head turns occurred during testing;Decreased speed of saccadic movement;Additional eye shifts occurred during  testing Perception Perception: Impaired Inattention/Neglect: Does not attend to left side of body;Does not attend to left visual field Spatial Orientation: Poor midline awareness, posterior bias in sitting Praxis Praxis: Impaired Praxis Impairment Details: Initiation;Motor planning  Cognition Overall Cognitive Status: Impaired/Different from baseline Arousal/Alertness: Awake/alert Orientation Level: Oriented X4 Attention: Sustained Sustained Attention: Impaired Sustained Attention Impairment: Functional basic;Verbal complex Memory: Impaired Awareness: Impaired Problem Solving: Impaired Safety/Judgment: Impaired Sensation Sensation Light Touch: Impaired Detail Light Touch Impaired Details: Impaired LUE;Impaired LLE Proprioception: Impaired by gross assessment Coordination Gross Motor Movements are Fluid and Coordinated: No Fine Motor Movements are Fluid and Coordinated: No Coordination and Movement Description: dense L hemiplegia Motor  Motor Motor: Hemiplegia;Abnormal tone;Motor apraxia;Abnormal postural alignment and control  Mobility Bed Mobility Bed Mobility: Supine to Sit;Sit to Supine Supine to Sit: Supervision/Verbal cueing Sit to Supine: Minimal Assistance - Patient > 75% Transfers Transfers: Sit to Stand;Stand to Sit;Squat Pivot Transfers Sit to Stand: Minimal Assistance - Patient > 75% Stand to Sit: Minimal Assistance - Patient > 75% Squat Pivot Transfers: Minimal Assistance - Patient > 75%;Moderate Assistance - Patient 50-74% (MinA R, ModA L) Transfer (Assistive device): None Locomotion  Gait Ambulation: Yes Gait Assistance: Moderate Assistance - Patient 50-74% Gait Distance (Feet): 40 Feet Assistive device: Hemi-walker Gait Assistance Details: Tactile cues for initiation;Tactile cues for posture;Verbal cues for sequencing;Verbal cues for gait pattern;Verbal cues for technique;Verbal cues for safe use of DME/AE;Tactile cues for weight shifting;Manual  facilitation for weight shifting Gait Gait: Yes Gait Pattern: Impaired Gait Pattern:  (Dense L hemi gait pattern) Gait velocity: Decreased Stairs / Additional Locomotion Stairs: Yes Stairs Assistance: Minimal Assistance - Patient > 75% Stair Management Technique: One rail Right Number of Stairs: 4 Height of Stairs: 6 Curb: Minimal Assistance - Patient >75% Wheelchair Mobility Wheelchair Mobility: Yes Wheelchair Assistance: Supervision/Verbal cueing Wheelchair Propulsion: Right upper extremity;Right lower  extremity Wheelchair Parts Management: Needs assistance Distance: 300'  Trunk/Postural Assessment  Cervical Assessment Cervical Assessment: Exceptions to [facility] (forward head) Thoracic Assessment Thoracic Assessment: Exceptions to [facility] (flattened anterior chest wall) Lumbar Assessment Lumbar Assessment: Exceptions to [facility] (posterior pelvic tilt) Postural Control Postural Control: Deficits on evaluation (delayed and decreased)  Balance Balance Balance Assessed: Yes Static Sitting Balance Static Sitting - Balance Support: Feet supported;Right upper extremity supported Static Sitting - Level of Assistance: 5: Stand by assistance Dynamic Sitting Balance Dynamic Sitting - Balance Support: Feet supported;Right upper extremity supported Dynamic Sitting - Level of Assistance: 5: Stand by assistance Static Standing Balance Static Standing - Balance Support: During functional activity;Right upper extremity supported Static Standing - Level of Assistance: 5: Stand by assistance;4: Min assist Dynamic Standing Balance Dynamic Standing - Balance Support: During functional activity;Right upper extremity supported Dynamic Standing - Level of Assistance: 4: Min assist Extremity Assessment      RLE Assessment RLE Assessment: Within Functional Limits     Breck Coons 03/05/2022, 6:04 PM

## 2022-03-05 NOTE — Progress Notes (Signed)
Physical Therapy Weekly Progress Note  Patient Details  Name: Jacob [name] MRN: [id number] Date of Birth: Jul 17, 1975  Beginning of progress report period: February 24, 2022 End of progress report period: March 05, 2022  Today's Date: 03/05/2022 PT Individual Time: 0902-0959 and 2671-2458 PT Individual Time Calculation (min): 57 min and 56 min  Patient has met 1 of 3 short term goals. Pt is progressing toward long term goals, but has only achieved 1 short term goal secondary to L hemiplegia and difficulty weight shifting into L hemibody during mobility. Pt still requires modA overall for mobility, though is performing bed mobility and simple transfers with minA. Pt as received formal family education.  Patient continues to demonstrate the following deficits muscle weakness, decreased cardiorespiratoy endurance, decreased coordination, decreased attention to left, and decreased sitting balance, decreased standing balance, decreased postural control, hemiplegia, and decreased balance strategies and therefore will continue to benefit from skilled PT intervention to increase functional independence with mobility.  Patient progressing toward long term goals..  Continue plan of care.  PT Short Term Goals Week 2:  PT Short Term Goal 1 (Week 2): Pt will perform bed mobility with minA consistently. PT Short Term Goal 1 - Progress (Week 2): Met PT Short Term Goal 2 (Week 2): Pt will perform basic transfers with minA >50% of time. PT Short Term Goal 2 - Progress (Week 2): Progressing toward goal PT Short Term Goal 3 (Week 2): Pt will ambulate 100' with LRAD and modA. PT Short Term Goal 3 - Progress (Week 2): Progressing toward goal Week 3:  PT Short Term Goal 1 (Week 3): STGs = LTGs  Skilled Therapeutic Interventions/Progress Updates:  Ambulation/gait training;Discharge planning;Cognitive remediation/compensation;DME/adaptive equipment instruction;Functional mobility training;Pain  management;Psychosocial support;Therapeutic Activities;Splinting/orthotics;UE/LE Strength taining/ROM;Visual/perceptual remediation/compensation;Wheelchair propulsion/positioning;UE/LE Coordination activities;Therapeutic Exercise;Stair training;Skin care/wound management;Patient/family education;Neuromuscular re-education;Functional electrical stimulation;Disease management/prevention;Community reintegration;Balance/vestibular training   1st Session: Pt received supine in bed and agrees to therapy. No complaint of pain. PT threads pants over pt's lower extremities and provides maxA overall to don lower body dressing. Pt performs supine to sit with bed features and cues for body mechanics and sequencing. Pt dons shirt while seated EOB with supervision assist and cues for sequencing. Sit to stand and stand step transfer to [facility] with modA and cues for initiation, anterior weight shift, hand placement, and positioning. WC transport to gym for time management. PT recommends performing modified plantargrade activity to provide WB through L upper extremity as well as L lower extremity. Pt stands to high-low table with minA and PT provides hand over hand assistance to guide patient's L hand down to mat. Pt quickly reports pain in L wrist and L shoulder. PT recommends attempting WB through forearms but pt declines, saying he is not longer interested in activity. PT adjusts session to manage pt's pain in L upper extremity. Pt instead performs WC mobility, tasked with navigating obstacle course, weaving in and out of cones, using R upper and lower extremities for propulsion. Pt is able to complete without external assistance from PT, but routinely makes contact with cones on L side. PT provides cueing to allow for wider berth and pt makes adjustment but continues to contact cones on L. Following rest break, pt propels WC back to room, x300', with cues to utilize increased R shoulder extension for increased power, and to  utilize R foot to steer rather than to propel, for energy conservation. Pt requires several brief rest breaks but no physical assistance. Stand pivot transfer to recliner with modA  and cues for sequencing. Left seated with alarm intact and all needs within reach.  2nd Session:  Pt received supine in bed and agrees to therapy. No complaint of pain. Supine to sit with bed features and cues for sequencing. Sit to stand and stand step transfer to WC with modA and cues for body mechanics and positioning. WC transport to gym. Pt performs sit to stand with minA and cues for anterior weight shift. Pt ambulates x30' with hemiwalker and modA, with PT facilitating progression of L lower extremity as well as blocking L knee during stance phase. Pt tends to rely heavily on hemiwalker and avoid weight shifting to the L, despite max PT cues to laterally weight shift. Pt performs sit to supine with minA with tactile cueing at trunk. PT provides prom and soft tissue lengthening of L lower extremity, specifically into knee flexion to provide soft tissue stretch of L quad, as pt presents with significant extensor tone. PT provides 3x1:00 stretches into max range of knee flexion, with pt reporting discomfort in L quad, but is able to tolerate stretch. Supine to sit with modA. Pt performs anterior reaching activity to promote weight shifting and increased WB through L lower extremity. Activity progressed to pt performing sit-to-squat repetitions with platform in between legs to promote symmetrical WB. Pt is able to clear buttocks from mat with minA and cues for sequencing. X7 reps total. Squat pivot from mat>WC>bed with minA. Pt left supine with alarm intact and all needs within reach.    Therapy Documentation Precautions:  Precautions Precautions: Fall Precaution Comments: L hemiplegia; painful LUE/shoulder; Required Braces or Orthoses: Splint/Cast Splint/Cast: WHO - Resting hand splint at night Restrictions Weight  Bearing Restrictions: No  Therapy/Group: Individual Therapy   C , PT, DPT 03/05/2022, 5:09 PM  

## 2022-03-05 NOTE — Progress Notes (Signed)
PROGRESS NOTE   Subjective/Complaints:  Pt seen at bedside. No acute complaints.  He denies any issues overnight, no side effects from Ditropan. He does think he can hold his urine for longer on it, however continues to have incontinent episodes and refuse timed toileting. Had prolonged discussion with patient about this, basically he feels it is much less effort on his part to just go in the bed when he feels the urge rather than try to use a peri bottle/bid pan/get up to the toilet. Even though he believes he could hold it, he "feels better" just going in the bed for both urination and bowel movements. Did discuss how this can cause significant caregiver burden, which patient acknowledged but did not accept as a reason to try toileting. He did state he would be agreeable to wearing a brief to contain incontinent episodes and allow easier clean up.   ROS: Patient denies fever, rash, sore throat, blurred vision, dizziness, nausea, vomiting, diarrhea, cough, shortness of breath or chest pain, joint or back/neck pain.   Objective:   No results found. No results for input(s): "WBC", "HGB", "HCT", "PLT" in the last 72 hours.  No results for input(s): "NA", "K", "CL", "CO2", "GLUCOSE", "BUN", "CREATININE", "CALCIUM" in the last 72 hours.   Intake/Output Summary (Last 24 hours) at 03/05/2022 1216 Last data filed at 03/05/2022 0901 Gross per 24 hour  Intake 947 ml  Output 300 ml  Net 647 ml         Physical Exam: Vital Signs Blood pressure 114/87, pulse 66, temperature 98.2 F (36.8 C), resp. rate 17, height 6\' 1"  (1.854 m), weight 81 kg, SpO2 100 %.  General: No apparent distress HEENT: Large cranioplasty - stable, healed.  +Strabismus, stable. oral mucosa pink and moist Neck: Supple without JVD or lymphadenopathy Heart: Reg rate and rhythm. No murmurs rubs or gallops Chest: CTA bilaterally without wheezes, rales, or rhonchi;  no distress. Normal rate. Non-labored Abdomen: Soft, non-tender, non-distended, bowel sounds positive. Extremities: No clubbing, cyanosis, or edema.  Psych: Pt's affect is appropriate. Pt is cooperative Skin: Crani incision and abdominal incisions healed, staples s/p removal.  Neuro:  pt is alert and oriented x3. Left central 7, mild dysarthria. L neglect, improved.  Insight: Poor insight into condition.  MSK; L shoulder in sling, no appreciable movement. shoulder subluxation 2 fingerbreadths, no TTP. Tone: LUE contractures in finger flexors, MAS 2 wrist flexors, MAS 2 eblow flexors, MAS 2 elbow extensors. +WHO LLE MAS 2 knee flexors, MAS 1 ankle plantarflexors - nonsustained clonus on jerk  Assessment/Plan: 1. Functional deficits which require 3+ hours per day of interdisciplinary therapy in a comprehensive inpatient rehab setting. Physiatrist is providing close team supervision and 24 hour management of active medical problems listed below. Physiatrist and rehab team continue to assess barriers to discharge/monitor patient progress toward functional and medical goals  Care Tool:  Bathing        Body parts bathed by helper: Right arm, Left arm, Chest, Abdomen, Front perineal area, Buttocks, Left lower leg, Right lower leg, Left upper leg, Right upper leg, Face     Bathing assist Assist Level: Dependent - Patient 0%  Upper Body Dressing/Undressing Upper body dressing   What is the patient wearing?: Pull over shirt    Upper body assist Assist Level: Maximal Assistance - Patient 25 - 49%    Lower Body Dressing/Undressing Lower body dressing      What is the patient wearing?: Pants, Incontinence brief     Lower body assist Assist for lower body dressing: Total Assistance - Patient < 25%     Toileting Toileting Toileting Activity did not occur Press photographer and hygiene only): Refused  Toileting assist Assist for toileting: 2 Helpers     Transfers Chair/bed  transfer  Transfers assist     Chair/bed transfer assist level: Moderate Assistance - Patient 50 - 74%     Locomotion Ambulation   Ambulation assist      Assist level: Maximal Assistance - Patient 25 - 49% Assistive device: Walker-rolling Max distance: 2 feet   Walk 10 feet activity   Assist  Walk 10 feet activity did not occur: Safety/medical concerns        Walk 50 feet activity   Assist Walk 50 feet with 2 turns activity did not occur: Safety/medical concerns         Walk 150 feet activity   Assist Walk 150 feet activity did not occur: Safety/medical concerns         Walk 10 feet on uneven surface  activity   Assist Walk 10 feet on uneven surfaces activity did not occur: Safety/medical concerns         Wheelchair     Assist Is the patient using a wheelchair?: Yes Type of Wheelchair: Manual    Wheelchair assist level: Supervision/Verbal cueing Max wheelchair distance: 106 ft    Wheelchair 50 feet with 2 turns activity    Assist        Assist Level: Supervision/Verbal cueing   Wheelchair 150 feet activity     Assist      Assist Level: Moderate Assistance - Patient 50 - 74%   Blood pressure 114/87, pulse 66, temperature 98.2 F (36.8 C), resp. rate 17, height 6\' 1"  (1.854 m), weight 81 kg, SpO2 100 %.  Medical Problem List and Plan: 1. Functional deficits secondary to debility after cranioplasty and C-Diff in setting of prior right MCA stroke, left hemiplegia             -patient may not yet shower             -ELOS/Goals: 12-14 days, Min A-mod assist with PT, OT, SLP             - resting WHO for LUE - PRAFO to maintain heel cord stretch; intermittent refusal, reinforce  -  CIR therapies ongoing including PT, OT, and SLP              - 10/6 - Of note, no further medication changes without discussing with wife             - 10/10 - SW looking into trading in reclining WC for standard WC to improve independence at  discharge  2.  Antithrombotics: -DVT/anticoagulation:  Pharmaceutical: Eliquis             -antiplatelet therapy: none 3. Pain Management: Tylenol, oxycodone, hydrocodone, Robaxin as needed - significant pain from shoulder subluxation.              - 9/27 schedule Tylenol 1000 mg TID for pain management + Change pain regimen to oxycodone 2.5/5 mg PRN for moderate/severe pain; DC  hydrocodone, DC robaxin              - For L shoulder sublux: Order OT taping and ROM, sling when OOB and not mobilizing shoulder, pillow support in bed 9/27 - improved 9/28               - 10/4 DC PRN Oxycodone due to no use in several days               - add baclofen 10 mg BID for worsening LUE and LLE tone; monitor for cognitive effects -> inc 10 TID 10/4 - improved; will hold off on increase d/t cognitive deficits. May consider switch to Dantrolene as OP.   4. Mood/Behavior/Sleep: LCSW to evaluate and provide emotional support             -antipsychotic agents: n/a             -depression: continue Lexapro 20 mg daily             -insomnia: continue melatonin; DC trazadone d/t no use             - Adding ritalin 5 mg BID to improve processing speed and attention 10/4 -> decrease to 2.5 BID for verbose, perseverative 10/5 -> DC 10/6 due to insomnia, impulsive behaviors - resolved  5. Neuropsych/cognition: This patient is capable of making decisions on his own behalf. 6. Skin/Wound Care: routine skin care checks             -monitor scalp and abdominal surgical incisions - staples removal 9/29 completed without complication - healing well   7. Fluids/Electrolytes/Nutrition:               -carb modified diet  -can dc IVF  9/24-Potassium 3.0--add supplement - 3.2 9/27-- inc supplement to 20 meq BID  - BMP 9/30 improved - stable 10/1    -BUN/Cr wnl    -encourage PO    Latest Ref Rng & Units 03/01/2022    6:43 AM 02/22/2022    6:22 AM 02/21/2022    7:10 AM  BMP  Glucose 70 - 99 mg/dL 417  408  144   BUN 6 - 20  mg/dL 8  8  8    Creatinine 0.61 - 1.24 mg/dL  8.18  5.63   Sodium 135 - 145 mmol/L 141  138  140   Potassium 3.5 - 5.1 mmol/L 3.8  3.9  3.9   Chloride 98 - 111 mmol/L 105  104  104   CO2 22 - 32 mmol/L 28  27  28    Calcium 8.9 - 10.3 mg/dL 1.49  9.4  9.2    BUN/Cr WNL, continue to monitor  8: DM: Hgb A1c = 8.1; on SSI             -consider restarting home Lantus and Novolog    -9/24 fair control. Hold scheduled insulin for now            - 9/26 - rare PM highs >200 ~2100; otherwise well controlled             - 9/29 - consistent use of SSI 3-9 units with increased needs at night; will start Lantus 3U daily 9/30            - 10/2 Increase Lantus to 5U daily -> 8U daily 10/4 -> 10 U 10/6  10/8 Fairly well controlled, continue current regimen and monitor  Recent Labs    03/04/22  1637 03/04/22 2207 03/05/22 0613  GLUCAP 158* 189* 162*       9: Hypertension: monitor             -continue clonidine patch 0.3 mg weekly             -continue amlodipine 10 mg daily             -continue Lopressor 25 mg BID             -continue lisinopril 40 mg daily             - 10/8 well controlled, continue to monitor    03/05/2022    5:30 AM 03/04/2022    7:00 PM 03/04/2022    9:14 AM  Vitals with BMI  Systolic [phone]  Diastolic 87 74 92  Pulse 66 61 65     10: C. diff diarrhea: continue oral vanc, ended 9/30; discontinue Colace for now             - Now daily BM, formed             - Per infection control , continue PO vanc for duration, precautions must remain for 30 days.              - 10/1 - inquiring with infection control on grounds pass once finishes with abx - approved  -10/2 Reports stool more firm yesterday            - 10/4 hard Bms; adding Sennakot 1 tab nightly  -10/8 LBM yesterday-large mushy, not on any laxatives currently, continue to monitor  11. Incontinence of bowel and bladder - ongoing             - 10/4 pt reports sensate for both, can't hold;  initiate timed toiletting Q2H - reinforced to nursing must get OOB 10/6 -> reinforced to patient OOB 10/9             - 10/6 DC sennakot QHS              - 10/11 - trial of ditropan TID, with Q6H bladder scans. Will re-evaluate in AM. Approved by wife.              - 10/12 - no significant retention on ditropan, however patient behaviorally not willing to try timed toileting. Nursing to trial briefs for ease of caregiver burden, request to simulate home resources as much as possible and limit use of suction catheters. DC ditropan.  LOS: 19 days A FACE TO [place] 03/05/2022, 12:16 PM

## 2022-03-06 ENCOUNTER — Other Ambulatory Visit (HOSPITAL_COMMUNITY): Payer: Self-pay

## 2022-03-06 LAB — GLUCOSE, CAPILLARY
Glucose-Capillary: 138 mg/dL — ABNORMAL HIGH (ref 70–99)
Glucose-Capillary: 218 mg/dL — ABNORMAL HIGH (ref 70–99)
Glucose-Capillary: 170 mg/dL — ABNORMAL HIGH (ref 70–99)
Glucose-Capillary: 192 mg/dL — ABNORMAL HIGH (ref 70–99)

## 2022-03-06 MED ORDER — AMLODIPINE BESYLATE 10 MG PO TABS
10.0000 mg | ORAL_TABLET | Freq: Every day | ORAL | 0 refills | Status: DC
  Filled 2022-03-06: qty 30, 30d supply, fill #0

## 2022-03-06 MED ORDER — LISINOPRIL 40 MG PO TABS
40.0000 mg | ORAL_TABLET | Freq: Every day | ORAL | 0 refills | Status: DC
  Filled 2022-03-06: qty 30, 30d supply, fill #0

## 2022-03-06 MED ORDER — BACLOFEN 10 MG PO TABS
10.0000 mg | ORAL_TABLET | Freq: Three times a day (TID) | ORAL | 0 refills | Status: DC
  Filled 2022-03-06: qty 90, 30d supply, fill #0

## 2022-03-06 MED ORDER — INSULIN GLARGINE 100 UNIT/ML ~~LOC~~ SOLN: 10.0000 [IU] | Freq: Every day | SUBCUTANEOUS | 0 refills | Status: DC

## 2022-03-06 MED ORDER — CLONIDINE 0.3 MG/24HR TD PTWK
0.3000 mg | MEDICATED_PATCH | TRANSDERMAL | 0 refills | Status: DC
  Filled 2022-03-06: qty 4, 28d supply, fill #0

## 2022-03-06 MED ORDER — ENSURE ENLIVE PO LIQD: 237.0000 mL | Freq: Two times a day (BID) | ORAL | 12 refills | Status: DC

## 2022-03-06 MED ORDER — CLONIDINE 0.3 MG/24HR TD PTWK: 0.3000 mg | MEDICATED_PATCH | TRANSDERMAL | 0 refills | Status: DC

## 2022-03-06 MED ORDER — POTASSIUM CHLORIDE CRYS ER 20 MEQ PO TBCR
20.0000 meq | EXTENDED_RELEASE_TABLET | Freq: Two times a day (BID) | ORAL | 0 refills | Status: DC
  Filled 2022-03-06: qty 28, 14d supply, fill #0

## 2022-03-06 MED ORDER — METOPROLOL TARTRATE 25 MG PO TABS: 25.0000 mg | ORAL_TABLET | Freq: Two times a day (BID) | ORAL | Status: DC

## 2022-03-06 MED ORDER — INSULIN ASPART 100 UNIT/ML IJ SOLN
0.0000 [IU] | Freq: Three times a day (TID) | INTRAMUSCULAR | 0 refills | Status: DC
  Filled 2022-03-06: qty 10, 17d supply, fill #0

## 2022-03-06 MED ORDER — APIXABAN 5 MG PO TABS: 5.0000 mg | ORAL_TABLET | Freq: Two times a day (BID) | ORAL | Status: DC

## 2022-03-06 MED ORDER — ESCITALOPRAM OXALATE 20 MG PO TABS: 20.0000 mg | ORAL_TABLET | Freq: Every day | ORAL | 0 refills | Status: DC

## 2022-03-06 MED ORDER — BACLOFEN 10 MG PO TABS: 10.0000 mg | ORAL_TABLET | Freq: Three times a day (TID) | ORAL | 0 refills | Status: DC

## 2022-03-06 MED ORDER — INSULIN LISPRO (1 UNIT DIAL) 100 UNIT/ML (KWIKPEN): 0.0000 [IU] | PEN_INJECTOR | Freq: Three times a day (TID) | SUBCUTANEOUS | 1 refills | Status: DC

## 2022-03-06 MED ORDER — LISINOPRIL 40 MG PO TABS: 40.0000 mg | ORAL_TABLET | Freq: Every day | ORAL | 0 refills | Status: DC

## 2022-03-06 MED ORDER — POTASSIUM CHLORIDE CRYS ER 20 MEQ PO TBCR: 20.0000 meq | EXTENDED_RELEASE_TABLET | Freq: Two times a day (BID) | ORAL | 0 refills | Status: DC

## 2022-03-06 MED ORDER — INSULIN LISPRO (1 UNIT DIAL) 100 UNIT/ML (KWIKPEN)
0.0000 [IU] | PEN_INJECTOR | Freq: Three times a day (TID) | SUBCUTANEOUS | 1 refills | Status: DC
  Filled 2022-03-06: qty 15, 25d supply, fill #0

## 2022-03-06 MED ORDER — AMLODIPINE BESYLATE 10 MG PO TABS: 10.0000 mg | ORAL_TABLET | Freq: Every day | ORAL | 0 refills | Status: DC

## 2022-03-06 MED ORDER — ATORVASTATIN CALCIUM 40 MG PO TABS: 40.0000 mg | ORAL_TABLET | Freq: Every day | ORAL | 0 refills | Status: DC

## 2022-03-06 NOTE — Progress Notes (Signed)
Orthopedic Tech Progress Note Patient Details:  Jacob [name] 1976/01/29 [id number] Called in order to [facility] for AFO Patient ID: Jacob [name], male   DOB: [birth year], 46 y.o.   MRN: [id number]  [name] 03/06/2022, 9:59 AM

## 2022-03-06 NOTE — TOC Transition Note (Signed)
Two (2) discharge meds stored in the inpatient pharmacy for patient until discharge 03/06/22 [facility]

## 2022-03-06 NOTE — Progress Notes (Signed)
Speech Language Pathology Discharge Summary  Patient Details  Name: Jacob [name] MRN: [id number] Date of Birth: [birth year]  Date of Discharge from [place] 13, 2023  Today's Date: 03/06/2022 SLP Individual Time: 1430-1515 SLP Individual Time Calculation (min): 45 min   Skilled Therapeutic Interventions: Skilled ST services focused on education skills. SLP facilitated education of memory strategies providing memory handout for methods to aid in recall in functional situations. SLP educated pt on cognitive improvement and requiring current min A in basic cognitive skills with the need for continued ST services at discharge. SLP focused on safety awareness, impact of deficits and pt was able to list 3 task he could complete at home alone and 3 with assistance with min A verbal cues. Pt politely declined participation in memory tasks. Pt asked no questions during education. Pt was left in bed with call bell within reach.   Patient has met 6 of 6 long term goals.  Patient to discharge at [facility] level.   Reasons goals not met: N/A   Clinical Impression/Discharge Summary: Patient has made functional gains and has met 6 of 6 LTGs this admission. Currently, patient is consuming regular textures with thin liquids without overt s/s of aspiration and requires intermittent verbal cues for use of swallowing compensatory strategies. Patient demonstrates improvement in overall cognitive functioning and requires Min A verbal and visual cues to complete functional and familiar tasks safely in regards to problem solving, sustained attention, emergent awareness, recall and visual scanning/attention to left field of environment. Patient and family education is complete and patient will discharge home with 24 hour supervision from family. Patient would benefit from f/u SLP services to maximize his cognitive functioning and overall functional independent and decrease caregiver burden.   Care Partner:   Caregiver Able to Provide Assistance: Yes  Type of Caregiver Assistance: Physical;Cognitive  Recommendation:  24 hour supervision/assistance;Outpatient SLP  Rationale for SLP Follow Up: Reduce caregiver burden;Maximize cognitive function and independence   Equipment: N/A   Reasons for discharge: Discharged from hospital;Treatment goals met   Patient/Family Agrees with Progress Made and Goals Achieved: Yes    [name] 03/06/2022, 2:52 PM

## 2022-03-06 NOTE — Progress Notes (Signed)
Physical Therapy Session Note  Patient Details  Name: Jacob [name] MRN: [id number] Date of Birth: June 30, 1975  Today's Date: 03/06/2022 PT Individual Time: 0805-0900 PT Individual Time Calculation (min): 55 min   Short Term Goals: Week 1:  PT Short Term Goal 1 (Week 1): Patient will perform bed mobility with min A consistently. PT Short Term Goal 1 - Progress (Week 1): Progressing toward goal PT Short Term Goal 2 (Week 1): Patient will perform basic transfers with min A >50% of the time. PT Short Term Goal 2 - Progress (Week 1): Progressing toward goal PT Short Term Goal 3 (Week 1): Patient will ambulate >100 ft using LRAD with mod A. PT Short Term Goal 3 - Progress (Week 1): Progressing toward goal Week 2:  PT Short Term Goal 1 (Week 2): Pt will perform bed mobility with minA consistently. PT Short Term Goal 1 - Progress (Week 2): Met PT Short Term Goal 2 (Week 2): Pt will perform basic transfers with minA >50% of time. PT Short Term Goal 2 - Progress (Week 2): Progressing toward goal PT Short Term Goal 3 (Week 2): Pt will ambulate 100' with LRAD and modA. PT Short Term Goal 3 - Progress (Week 2): Progressing toward goal Week 3:  PT Short Term Goal 1 (Week 3): STGs = LTGs  Skilled Therapeutic Interventions/Progress Updates:      Pt received supine in bed and agreeable to PT. Supine>sit transfer with supervision assist and heavy use of rails. Lower body dressing with max assist for time manament at EOB. No assist for sitting balance. Stand pivot transfers to and form WC with min assist and cues for proper UE placement.  Sit<>stand transfers with min assist for safety and LLE blocked. WC mobility with supervision assist 2 x 178f with cues  for turning technique initially. Gait training with HW and LAFO x 272fwith max assist to advance the LLE due to poor attention and cue for terminal knee extension.Stair management training with mod-max assist for advancement of the LLE onto step for  ascent and descent x 4 steps. Patient returned to room and performed stand pivot to recliner with min assist and cues for UE placement and use of the LUE. Pt left sitting in recliner with call bell in reach and all needs met.     Therapy Documentation Precautions:  Precautions Precautions: Fall Precaution Comments: L hemiplegia; painful LUE/shoulder; Required Braces or Orthoses: Splint/Cast Splint/Cast: WHO - Resting hand splint at night Restrictions Weight Bearing Restrictions: No  Pain: Pain Assessment Pain Scale: 0-10 Pain Score: 0-No pain     Therapy/Group: Individual Therapy  AuLorie Phenix0/13/2023, 1:17 PM

## 2022-03-06 NOTE — Progress Notes (Signed)
Inpatient Rehabilitation Discharge Medication Review by a Pharmacist  A complete drug regimen review was completed for this patient to identify any potential clinically significant medication issues.  High Risk Drug Classes Is patient taking? Indication by Medication  Antipsychotic No   Anticoagulant Yes Apixaban for Afib & hx DVT  Antibiotic No   Opioid No   Antiplatelet No   Hypoglycemics/insulin Yes Insulin lispro and insulin glargine for DMT2  Vasoactive Medication Yes Amlodipine, clonidine, lisinopril, metoprolol tartrate for HTN  Chemotherapy No   Other Yes Acetaminophen prn Pain Atorvastatin for HLD Baclofen for muscle spasms Escitalopram for depression Potassium supplement      Type of Medication Issue Identified Description of Issue Recommendation(s)  Drug Interaction(s) (clinically significant)     Duplicate Therapy     Allergy     No Medication Administration End Date     Incorrect Dose     Additional Drug Therapy Needed     Significant med changes from prior encounter (inform family/care partners about these prior to discharge). NEW baclofen, potassium  DECREASED insulin lispro and insulin glargine    Other  Insulin aspart prescribed, has insulin lispro at home Changed insulin aspart to insulin lispro since that is what patient has at home     Clinically significant medication issues were identified that warrant physician communication and completion of prescribed/recommended actions by midnight of the next day:  No  Name of provider notified for urgent issues identified:   Provider Method of Notification:     Pharmacist comments:   Time spent performing this drug regimen review (minutes):  [place], PharmD, BCPS, [facility] Clinical Pharmacist  Please check AMION for all [place] phone numbers After 10:00 PM, call [place]

## 2022-03-06 NOTE — Progress Notes (Signed)
Inpatient Rehabilitation Care Coordinator Discharge Note   Patient Details  Name: Jacob [name] MRN: [id number] Date of Birth: 17-Nov-1975   Discharge location: D/c to home with his wife  Length of Stay: 19 days  Discharge activity level: mod Asst  Home/community participation: Limited  Patient response TA:[id number] Literacy - How often do you need to have someone help you when you read instructions, pamphlets, or other written material from your doctor or pharmacy?: Never  Patient response IA:XKPVVZ Isolation - How often do you feel lonely or isolated from those around you?: Never  Services provided included: MD, RD, PT, RN, TR, Pharmacy, Neuropsych, CM, SLP, OT  Financial Services:  Financial Services Utilized: Private Insurance [place] [employer]  Choices offered to/list presented to: Yes  Follow-up services arranged:  Home Health, DME Home Health Agency: [facility] for HHPT/OT/SLP    DME : [employer] [facility] for w/c, left lap tray, and hemi walker    Patient response to transportation need: Is the patient able to respond to transportation needs?: Yes In the past 12 months, has lack of transportation kept you from medical appointments or from getting medications?: No In the past 12 months, has lack of transportation kept you from meetings, work, or from getting things needed for daily living?: No    Comments (or additional information): DME to be shipped to home per [place].   Patient/Family verbalized understanding of follow-up arrangements:  Yes  Individual responsible for coordination of the follow-up plan: contact pt wife Lashuanna  Confirmed correct DME delivered: Rana Snare 03/06/2022    Rana Snare

## 2022-03-06 NOTE — Plan of Care (Signed)
  Problem: RH Swallowing Goal: LTG Patient will consume least restrictive diet using compensatory strategies with assistance (SLP) Description: LTG:  Patient will consume least restrictive diet using compensatory strategies with assistance (SLP) 03/06/2022 1449 by Charolett Bumpers, CCC-SLP Outcome: Completed/Met Flowsheets (Taken 02/15/2022 1602 by Helaine Chess A, CCC-SLP) LTG: Pt Patient will consume least restrictive diet using compensatory strategies with assistance of (SLP): Supervision 03/06/2022 1448 by Charolett Bumpers, CCC-SLP Outcome: Completed/Met   Problem: RH Problem Solving Goal: LTG Patient will demonstrate problem solving for (SLP) Description: LTG:  Patient will demonstrate problem solving for basic/complex daily situations with cues  (SLP) 03/06/2022 1449 by Charolett Bumpers, [place] Outcome: Completed/Met Flowsheets (Taken 02/15/2022 1602 by Helaine Chess A, CCC-SLP) LTG: Patient will demonstrate problem solving for (SLP): Basic daily situations LTG Patient will demonstrate problem solving for: Minimal Assistance - Patient > 75% 03/06/2022 1448 by Charolett Bumpers, CCC-SLP Outcome: Completed/Met   Problem: RH Memory Goal: LTG Patient will demonstrate ability for day to day (SLP) Description: LTG:   Patient will demonstrate ability for day to day recall/carryover during cognitive/linguistic activities with assist  (SLP) 03/06/2022 1449 by Charolett Bumpers, [place] Outcome: Completed/Met Flowsheets (Taken 02/15/2022 1602 by Helaine Chess A, CCC-SLP) LTG: Patient will demonstrate ability for day to day recall:  New information  Daily complex information LTG: Patient will demonstrate ability for day to day recall/carryover during cognitive/linguistic activities with assist (SLP): Supervision 03/06/2022 1448 by Charolett Bumpers, CCC-SLP Outcome: Completed/Met Goal: LTG Patient will use memory compensatory aids to (SLP) Description: LTG:  Patient will use memory compensatory  aids to recall biographical/new, daily complex information with cues (SLP) 03/06/2022 1449 by Charolett Bumpers, [place] Outcome: Completed/Met Flowsheets (Taken 02/15/2022 1602 by Helaine Chess A, CCC-SLP) LTG: Patient will use memory compensatory aids to (SLP): Supervision 03/06/2022 1448 by Charolett Bumpers, CCC-SLP Outcome: Completed/Met   Problem: RH Attention Goal: LTG Patient will demonstrate this level of attention during functional activites (SLP) Description: LTG:  Patient will will demonstrate this level of attention during functional activites (SLP) 03/06/2022 1449 by Charolett Bumpers, [place] Outcome: Completed/Met Flowsheets (Taken 02/15/2022 1602 by Helaine Chess A, CCC-SLP) Patient will demonstrate during cognitive/linguistic activities the attention type of: Sustained LTG: Patient will demonstrate this level of attention during cognitive/linguistic activities with assistance of (SLP): Supervision 03/06/2022 1448 by Charolett Bumpers, CCC-SLP Outcome: Completed/Met   Problem: RH Awareness Goal: LTG: Patient will demonstrate awareness during functional activites type of (SLP) Description: LTG: Patient will demonstrate awareness during functional activites type of (SLP) 03/06/2022 1449 by Charolett Bumpers, [place] Outcome: Completed/Met Flowsheets (Taken 02/15/2022 1602 by Vinnie Langton, CCC-SLP) Patient will demonstrate during cognitive/linguistic activities awareness type of: Emergent LTG: Patient will demonstrate awareness during cognitive/linguistic activities with assistance of (SLP): Minimal Assistance - Patient > 75% 03/06/2022 1448 by Charolett Bumpers, CCC-SLP Outcome: Completed/Met

## 2022-03-06 NOTE — Progress Notes (Signed)
Occupational Therapy Session Note  Patient Details  Name: Jacob [name] MRN: [id number] Date of Birth: [birth year]  Today's Date: 03/06/2022 OT Individual Time: 1003-1100 OT Individual Time Calculation (min): 57 min    Short Term Goals: Week 3:  OT Short Term Goal 1 (Week 3): LTG=STG 2/2 ELOS  Skilled Therapeutic Interventions/Progress Updates:  Pt greeted seated in recliner, pt agreeable to OT intervention. Pt declined need for ADLs, squat pivot transfer to L with MINA, total A transport to gym in w/c. Utilized BITS to [employer] scanning. Pt completed below therapeutic activities from sitting in w/c.               Pt first instructed to reach out of BOS to dots on screen with an emphasis on determining R sided attention vs L. Results indicated below: 100% accuracy 1 .88 sec reaction time  slowest in L lower quad with 3.09 reaction time   Pt completed Visual pursuit tracking test with numbers spinning around in circular pattern 1-20 with pt instructed to tap numbers in correct order, results indicated below:  95% accuracy 14mins and 16 secs  6.82 sec reaction time, 1 miss Pt reprots "that was hard because they were spinning."   Bell cancellation test: missed 5 on the Left with initially no awraeness to L side of screen until cued.   Pt transported outside in w/c to increase mood and affect as pt very flat during session. Once outside pt more chatty. Worked on functional sit>stands with HW x6 with pt needing CGA, pt able to stand for > 33min as precursor to higher level functional mobility tasks.  Pt transported back to room with total A , pt completed squat pivot to EOB with MIN A to R side. Pt needed MOD A for sit>supine to elevate BLEs. Pt left supine in bed with bed alarm activated and all needs within reach.    Therapy Documentation Precautions:  Precautions Precautions: Fall Precaution Comments: L hemiplegia; painful LUE/shoulder; Required Braces or Orthoses:  Splint/Cast Splint/Cast: WHO - Resting hand splint at night Restrictions Weight Bearing Restrictions: No   Pain: no pain    Therapy/Group: Individual Therapy  Precious Haws 03/06/2022, 12:17 PM

## 2022-03-06 NOTE — Progress Notes (Addendum)
Patient ID: Jacob [name], male   DOB: Oct 11, 1975, 46 y.o.   MRN: [id number]  SW returned phone call to pt wife who reported after speaking with insurance about [facility] therapies she was informed pt would be assessed and would get more than 3 sessions. SW discussed preference HH vs Outpatient. She prefers HH therapies at this time.   SW follow-up with [facility] to discuss if referral can still be accepted, and waiting on follow-up.   SW faxed CAP/DA physician statement and clinicals to [facility] office [phone]).  SW left packet in room with physician statement. DME still not delivered to room. SW sent message to [facility] to get updates on pt DME delivery.   *HH referral accepted by [facility].   SW spoke with pt wife to inform on above about HHA and CAP/DA packet in room.   Issues with regard to medications in which pt insurance continues to show [employer] as primary. SW spoke with pt wife and pharmacy tech Thomasena Edis in regards to issue. Pt wife will pay for two medications, and will continue to follow-up with insurance about indicating [facility] as primary. SW also informed pt wife that DME will be delivered to home. She states [place] informed her that they will try and swap out wheelchairs while pt is here in the hospital. No questions/concerns reported by pt wife.   Loralee Pacas, MSW, [place] Office: [phone] Cell: [phone] Fax: [phone]

## 2022-03-06 NOTE — Progress Notes (Signed)
Occupational Therapy Session Note  Patient Details  Name: Jacob [name] MRN: [id number] Date of Birth: Sep 25, 1975  Today's Date: 03/06/2022 OT Individual Time: 6606-3016 OT Individual Time Calculation (min): 30 min    Short Term Goals: Week 3:  OT Short Term Goal 1 (Week 3): LTG=STG 2/2 ELOS  Skilled Therapeutic Interventions/Progress Updates:   Pt seen for aspects of discharge training and reassessment. Pt up in recliner upon OT arrival and reported "Miss Jacob [name] I did that shower yesterday". Pt with L UE sling in place and OT instructed to use pillow or blanket prop under L UE when not standing to allow for increased integration throughout the day. OT instructed in various L UE integration tasks including oral care with promotion of L hand as stabilizer. Pt completed cognitive portion of d/c with 15/15 BIMS. OT worked in standing to complete final standing tolerance with hemi walker >3 min x 2. Reinforced simple proximal scap/sh HEP, use of urinal vs brief and night time resting hand splint for home use. Left pt recliner level with chair exit alarm, nurse call button and needs within reach.   Therapy Documentation Precautions:  Precautions Precautions: Fall Precaution Comments: L hemiplegia; painful LUE/shoulder; Required Braces or Orthoses: Splint/Cast Splint/Cast: WHO - Resting hand splint at night Restrictions Weight Bearing Restrictions: No    Therapy/Group: Individual Therapy  Jacob [name] 03/06/2022, 7:44 AM

## 2022-03-06 NOTE — Progress Notes (Signed)
PROGRESS NOTE   Subjective/Complaints:  Pt seen at bedside. No acute complaints.   PT notes AFO he has been using is a Development worker, community, not from home, and inquires about Orthotist consult for a home device today. Wife also inquired about toe block, however PT states pt's current shoes would not accommodate it; going to recommend family discuss with orthotist as OP.   ROS: Patient denies fever, rash, sore throat, blurred vision, dizziness, nausea, vomiting, diarrhea, cough, shortness of breath or chest pain, joint or back/neck pain.   Objective:   No results found. No results for input(s): "WBC", "HGB", "HCT", "PLT" in the last 72 hours.  No results for input(s): "NA", "K", "CL", "CO2", "GLUCOSE", "BUN", "CREATININE", "CALCIUM" in the last 72 hours.   Intake/Output Summary (Last 24 hours) at 03/06/2022 1045 Last data filed at 03/06/2022 0807 Gross per 24 hour  Intake 817 ml  Output 600 ml  Net 217 ml         Physical Exam: Vital Signs Blood pressure 130/83, pulse (!) 55, temperature 97.9 F (36.6 C), resp. rate 18, height 6\' 1"  (1.854 m), weight 81 kg, SpO2 100 %.  General: No apparent distress HEENT: Large cranioplasty - stable, healed.  +Strabismus, stable. oral mucosa pink and moist Neck: Supple without JVD or lymphadenopathy Heart: Reg rate and rhythm. No murmurs rubs or gallops Chest: CTA bilaterally without wheezes, rales, or rhonchi; no distress. Normal rate. Non-labored Abdomen: Soft, non-tender, non-distended, bowel sounds positive. Extremities: No clubbing, cyanosis, or edema.  Psych: Pt's affect is appropriate. Pt is cooperative Skin: Crani incision and abdominal incisions healed, staples s/p removal.  Neuro:  pt is alert and oriented x3. Left central 7, mild dysarthria. L neglect, improved.  Insight: Poor insight into condition.  MSK; L shoulder in sling, no appreciable movement. shoulder subluxation 2  fingerbreadths, no TTP. Tone: LUE contractures in finger flexors, MAS 3 wrist flexors, MAS 3 eblow flexors, MAS 2 elbow extensors. LLE MAS 2 knee flexors, MAS 1 ankle plantarflexors - nonsustained clonus on jerk. +AFO.   Assessment/Plan: 1. Functional deficits which require 3+ hours per day of interdisciplinary therapy in a comprehensive inpatient rehab setting. Physiatrist is providing close team supervision and 24 hour management of active medical problems listed below. Physiatrist and rehab team continue to assess barriers to discharge/monitor patient progress toward functional and medical goals  Care Tool:  Bathing        Body parts bathed by helper: Right arm, Left arm, Chest, Abdomen, Front perineal area, Buttocks, Left lower leg, Right lower leg, Left upper leg, Right upper leg, Face     Bathing assist Assist Level: Moderate Assistance - Patient 50 - 74%     Upper Body Dressing/Undressing Upper body dressing   What is the patient wearing?: Pull over shirt    Upper body assist Assist Level: Minimal Assistance - Patient > 75%    Lower Body Dressing/Undressing Lower body dressing      What is the patient wearing?: Pants     Lower body assist Assist for lower body dressing: Moderate Assistance - Patient 50 - 74%     Toileting Toileting Toileting Activity did not occur and  hygiene only): Refused  Toileting assist Assist for toileting: Maximal Assistance - Patient 25 - 49%     Transfers Chair/bed transfer  Transfers assist     Chair/bed transfer assist level: Minimal Assistance - Patient > 75% (Min to R, mod to L)     Locomotion Ambulation   Ambulation assist      Assist level: Maximal Assistance - Patient 25 - 49% Assistive device: Walker-rolling Max distance: 2 feet   Walk 10 feet activity   Assist  Walk 10 feet activity did not occur: Safety/medical concerns        Walk 50 feet activity   Assist Walk 50 feet with 2  turns activity did not occur: Safety/medical concerns         Walk 150 feet activity   Assist Walk 150 feet activity did not occur: Safety/medical concerns         Walk 10 feet on uneven surface  activity   Assist Walk 10 feet on uneven surfaces activity did not occur: Safety/medical concerns         Wheelchair     Assist Is the patient using a wheelchair?: Yes Type of Wheelchair: Manual    Wheelchair assist level: Supervision/Verbal cueing Max wheelchair distance: 106 ft    Wheelchair 50 feet with 2 turns activity    Assist        Assist Level: Supervision/Verbal cueing   Wheelchair 150 feet activity     Assist      Assist Level: Moderate Assistance - Patient 50 - 74%   Blood pressure 130/83, pulse (!) 55, temperature 97.9 F (36.6 C), resp. rate 18, height 6\' 1"  (1.854 m), weight 81 kg, SpO2 100 %.  Medical Problem List and Plan: 1. Functional deficits secondary to debility after cranioplasty and C-Diff in setting of prior right MCA stroke, left hemiplegia             -patient may not yet shower             -ELOS/Goals: 12-14 days, Min A-mod assist with PT, OT, SLP  -  CIR therapies ongoing including PT, OT, and SLP              - 10/6 - Of note, no further medication changes without discussing with wife             - 10/10 - SW looking into trading in reclining WC for standard WC to improve independence at discharge              - 10/13 - Orthotist consult for home LLE AFO; Must wear WHO and PRAFO in bed and at rest. May resume lipitor for stroke ppx at discharge.   2.  Antithrombotics: -DVT/anticoagulation:  Pharmaceutical: Eliquis             -antiplatelet therapy: none 3. Pain Management: Tylenol, oxycodone, hydrocodone, Robaxin as needed - significant pain from shoulder subluxation.              - 9/27 schedule Tylenol 1000 mg TID for pain management + Change pain regimen to oxycodone 2.5/5 mg PRN for moderate/severe pain; DC  hydrocodone, DC robaxin              - For L shoulder sublux: Order OT taping and ROM, sling when OOB and not mobilizing shoulder, pillow support in bed 9/27 - improved 9/28               - 10/4 DC PRN Oxycodone  due to no use in several days               - add baclofen 10 mg BID for worsening LUE and LLE tone; monitor for cognitive effects -> inc 10 TID 10/4 - improved; will hold off on increase d/t cognitive deficits. May consider switch to Dantrolene as OP.   4. Mood/Behavior/Sleep: LCSW to evaluate and provide emotional support             -antipsychotic agents: n/a             -depression: continue Lexapro 20 mg daily             -insomnia: continue melatonin; DC trazadone d/t no use             - Adding ritalin 5 mg BID to improve processing speed and attention 10/4 -> decrease to 2.5 BID for verbose, perseverative 10/5 -> DC 10/6 due to insomnia, impulsive behaviors - resolved  5. Neuropsych/cognition: This patient is capable of making decisions on his own behalf. 6. Skin/Wound Care: routine skin care checks             -monitor scalp and abdominal surgical incisions - staples removal 9/29 completed without complication - healing well   7. Fluids/Electrolytes/Nutrition:               -carb modified diet  -can dc IVF  9/24-Potassium 3.0--add supplement - 3.2 9/27-- inc supplement to 20 meq BID  - BMP 9/30 improved - stable 10/1    -BUN/Cr wnl    -encourage PO    Latest Ref Rng & Units 03/01/2022    6:43 AM 02/22/2022    6:22 AM 02/21/2022    7:10 AM  BMP  Glucose 70 - 99 mg/dL 131  177  200   BUN 6 - 20 mg/dL 8  8  8    Creatinine 0.61 - 1.24 mg/dL 0.62  0.63  0.51   Sodium 135 - 145 mmol/L 141  138  140   Potassium 3.5 - 5.1 mmol/L 3.8  3.9  3.9   Chloride 98 - 111 mmol/L 105  104  104   CO2 22 - 32 mmol/L 28  27  28    Calcium 8.9 - 10.3 mg/dL 10.1  9.4  9.2    10/8 BUN/Cr WNL, continue to monitor  8: DM: Hgb A1c = 8.1; on SSI             -consider restarting home Lantus and  Novolog    -9/24 fair control. Hold scheduled insulin for now            - 9/26 - rare PM highs >200 ~2100; otherwise well controlled             - 9/29 - consistent use of SSI 3-9 units with increased needs at night; will start Lantus 3U daily 9/30            - 10/2 Increase Lantus to 5U daily -> 8U daily 10/4 -> 10 U 10/6  10/8 Fairly well controlled, continue current regimen and monitor  Recent Labs    03/05/22 1638 03/05/22 2034 03/06/22 0555  GLUCAP 131* 215* 138*       9: Hypertension: monitor             -continue clonidine patch 0.3 mg weekly             -continue amlodipine 10 mg daily             -  continue Lopressor 25 mg BID             -continue lisinopril 40 mg daily             - 10/8 well controlled, continue to monitor    03/06/2022    5:58 AM 03/05/2022    8:21 PM 03/05/2022    3:26 PM  Vitals with BMI  Systolic 130 113 846  Diastolic 83 86 76  Pulse 55 67 63     10: C. diff diarrhea: continue oral vanc, ended 9/30; discontinue Colace for now             - Now daily BM, formed             - Per infection control , continue PO vanc for duration, precautions must remain for 30 days.              - 10/1 - inquiring with infection control on grounds pass once finishes with abx - approved  -10/2 Reports stool more firm yesterday            - 10/4 hard Bms; adding Sennakot 1 tab nightly  -10/8 LBM yesterday-large mushy, not on any laxatives currently, continue to monitor  11. Incontinence of bowel and bladder - ongoing             - 10/4 pt reports sensate for both, can't hold; initiate timed toiletting Q2H - reinforced to nursing must get OOB 10/6 -> reinforced to patient OOB 10/9             - 10/6 DC sennakot QHS              - 10/11 - trial of ditropan TID, with Q6H bladder scans. Will re-evaluate in AM. Approved by wife.              - 10/12 - no significant retention on ditropan, however patient behaviorally not willing to try timed toileting. Nursing to  trial briefs for ease of caregiver burden, request to simulate home resources as much as possible and limit use of suction catheters. DC ditropan.             - 10/13 - ongoing incontinence, tolerating briefs.   LOS: 20 days A FACE TO FACE EVALUATION WAS PERFORMED  Angelina Sheriff 03/06/2022, 10:45 AM

## 2022-03-07 LAB — GLUCOSE, CAPILLARY: Glucose-Capillary: 163 mg/dL — ABNORMAL HIGH (ref 70–99)

## 2022-03-07 NOTE — Progress Notes (Signed)
Medications from pharmacy delivered to pt by this nurse at this time. Pt verbalizes understanding of medications and signed return form placed on chart.

## 2022-03-07 NOTE — Progress Notes (Signed)
Pt c/o of new R Lower gum pain and tenderness. RN provided oral care by brushing teeth, rinsing with mouthwash, Pt states the intervention decreased the pain and irritation. Pt Bladder scans has been less than 50 PVR and pt is voiding well.

## 2022-03-07 NOTE — Plan of Care (Signed)
  Problem: Sit to Stand Goal: LTG:  Patient will perform sit to stand with assistance level (PT) Description: LTG:  Patient will perform sit to stand with assistance level (PT) Outcome: Not Met (add Reason) Note: Requires additional assist for safety   Problem: RH Bed to Chair Transfers Goal: LTG Patient will perform bed/chair transfers w/assist (PT) Description: LTG: Patient will perform bed to chair transfers with assistance (PT). Outcome: Not Met (add Reason) Note: Requires additional assist for safety   Problem: RH Car Transfers Goal: LTG Patient will perform car transfers with assist (PT) Description: LTG: Patient will perform car transfers with assistance (PT). Outcome: Not Met (add Reason)   Problem: RH Ambulation Goal: LTG Patient will ambulate in controlled environment (PT) Description: LTG: Patient will ambulate in a controlled environment, # of feet with assistance (PT). Outcome: Not Met (add Reason) Note: Requires additional assist for safety due to L hemiplegia  Goal: LTG Patient will ambulate in home environment (PT) Description: LTG: Patient will ambulate in home environment, # of feet with assistance (PT). Outcome: Not Met (add Reason) Note: Requires additional assist for safety   Problem: RH Balance Goal: LTG Patient will maintain dynamic sitting balance (PT) Description: LTG:  Patient will maintain dynamic sitting balance with assistance during mobility activities (PT) Outcome: Completed/Met Goal: LTG Patient will maintain dynamic standing balance (PT) Description: LTG:  Patient will maintain dynamic standing balance with assistance during mobility activities (PT) Outcome: Completed/Met   Problem: RH Bed Mobility Goal: LTG Patient will perform bed mobility with assist (PT) Description: LTG: Patient will perform bed mobility with assistance, with/without cues (PT). Outcome: Completed/Met   Problem: RH Wheelchair Mobility Goal: LTG Patient will propel w/c in  controlled environment (PT) Description: LTG: Patient will propel wheelchair in controlled environment, # of feet with assist (PT) Outcome: Completed/Met Goal: LTG Patient will propel w/c in home environment (PT) Description: LTG: Patient will propel wheelchair in home environment, # of feet with assistance (PT). Outcome: Completed/Met   Problem: RH Attention Goal: LTG Patient will demonstrate this level of attention during functional activites (PT) Description: LTG:  Patient will demonstrate this level of attention during functional activites (PT) Outcome: Completed/Met

## 2022-03-10 ENCOUNTER — Ambulatory Visit: Payer: Medicaid Other | Admitting: Neurology

## 2022-03-18 ENCOUNTER — Other Ambulatory Visit: Payer: Self-pay

## 2022-03-18 NOTE — Patient Instructions (Signed)
Visit Information  Jacob [name] was given information about Medicaid Managed Care team care coordination services as a part of their Healthy [facility] Medicaid benefit. Jacob [name] verbally consented to engagement with the [facility] Managed Care team.   If you are experiencing a medical emergency, please call 911 or report to your local emergency department or urgent care.   If you have a non-emergency medical problem during routine business hours, please contact your provider's office and ask to speak with a nurse.   For questions related to your Healthy [facility] health plan, please call: [phone] or visit the homepage here: [email or web address]  If you would like to schedule transportation through your Healthy [facility] plan, please call the following number at least 2 days in advance of your appointment: [phone]  For information about your ride after you set it up, call Ride Assist at [phone]. Use this number to activate a Will Call pickup, or if your transportation is late for a scheduled pickup. Use this number, too, if you need to make a change or cancel a previously scheduled reservation.  If you need transportation services right away, call [phone]. The after-hours call center is staffed 24 hours to handle ride assistance and urgent reservation requests (including discharges) 365 days a year. Urgent trips include sick visits, hospital discharge requests and life-sustaining treatment.  Call the [facility] at [phone], at any time, 24 hours a day, 7 days a week. If you are in danger or need immediate medical attention call 911.  If you would like help to quit smoking, call 1-800-QUIT-NOW 564-133-9763) OR Espaol: 1-855-Djelo-Ya (3-267-124-5809) o para ms informacin haga clic aqu or Text READY to 200-400 to register via text  Jacob [name] - following are the goals we discussed in your visit today:   Goals  Addressed   None       Social Worker will follow up in 30 days .   Jacob [name], BSW, [place] Managed Medicaid Team  [phone]   Following is a copy of your plan of care:  There are no care plans that you recently modified to display for this patient.

## 2022-03-18 NOTE — Patient Outreach (Signed)
Medicaid Managed Care Social Work Note  03/18/2022 Name:  Jacob [name] MRN:  [id number] DOB:  Dec 04, 1975  Jacob [name] is an 46 y.o. year old male who is a primary patient of Townsend Roger, MD.  The Medicaid Managed Care Coordination team was consulted for assistance with:  Community Resources   Mr. Hur was given information about Medicaid Managed Care Coordination team services today. [name] Primary Caregiver agreed to services and verbal consent obtained.  Engaged with patient  for by telephone forfollow up visit in response to referral for case management and/or care coordination services.   Assessments/Interventions:  Review of past medical history, allergies, medications, health status, including review of consultants reports, laboratory and other test data, was performed as part of comprehensive evaluation and provision of chronic care management services.  SDOH: (Social Determinant of Health) assessments and interventions performed: BSW completed a telephone outreach with patient wife, she stated everything was going well. They were able to get [employer] to cover the modifications for the home for patient. Wife states no other resources are needed at this time.  Advanced Directives Status:  Not addressed in this encounter.  Care Plan                 Allergies  Allergen Reactions   Hydrochlorothiazide Other (See Comments)    Bilateral muscle cramping    Medications Reviewed Today     Reviewed by Orlinda Blalock, CPhT (Pharmacy Technician) on 02/14/22 at 81  Med List Status: Complete   Medication Order Taking? Sig Documenting Provider Last Dose Status Informant  acetaminophen (TYLENOL) 160 MG/5ML solution [id number]  Place 20.3 mLs (650 mg total) into feeding tube every 4 (four) hours as needed for mild pain (or temp > 37.5 C (99.5 F)).  Patient not taking: Reported on 12/31/2021   de [name], NP  Active Nursing Home Medication Administration Guide  (MAG)  acetaminophen (TYLENOL) 325 MG tablet [id number]  Take 650 mg by mouth every 12 (twelve) hours as needed for moderate pain. [provider]  Active Nursing Home Medication Administration Guide (MAG)  albuterol (PROVENTIL) (2.5 MG/3ML) 0.083% nebulizer solution [id number]  Take 3 mLs (2.5 mg total) by nebulization every 6 (six) hours as needed for wheezing or shortness of breath.  Patient not taking: Reported on 12/08/2021   de [name], NP  Active Nursing Home Medication Administration Guide (MAG)  amLODipine (NORVASC) 10 MG tablet [id number]  Place 1 tablet (10 mg total) into feeding tube daily.  Patient taking differently: Take 10 mg by mouth daily.   de Yolanda Manges, Altamease Oiler, NP  Active Nursing Home Medication Administration Guide (MAG)           Med Note Nash Mantis, [name]   Tue Feb 10, 2022  7:07 AM)    atorvastatin (LIPITOR) 40 MG tablet [id number]  Place 1 tablet (40 mg total) into feeding tube daily.  Patient taking differently: Take 40 mg by mouth daily.   de Yolanda Manges, Altamease Oiler, NP  Active Nursing Home Medication Administration Guide (MAG)           Med Note Nash Mantis, [name]   Tue Feb 10, 2022  7:07 AM)    bisacodyl (DULCOLAX) 10 MG suppository [id number]  Place 1 suppository (10 mg total) rectally daily at 2 PM.  Patient not taking: Reported on 12/08/2021   de [name], NP  Active Nursing Home Medication Administration Guide (MAG)  carvedilol (COREG) 25 MG tablet [id number]  Place 1 tablet (25 mg total) into feeding tube 2 (two) times daily with a meal.  Patient not taking: Reported on 12/08/2021   de [name], [name], NP  Active Nursing Home Medication Administration Guide (MAG)  chlorproMAZINE (THORAZINE) 10 MG tablet [id number]  Place 1 tablet (10 mg total) into feeding tube 3 (three) times daily as needed for hiccoughs.  Patient not taking: Reported on 12/08/2021   de [name], NP  Active Nursing Home Medication Administration Guide (MAG)   cloNIDine (CATAPRES - DOSED IN MG/24 HR) 0.3 mg/24hr patch [id number]  Place 1 patch (0.3 mg total) onto the skin once a week. de [name], [name], NP  Active Nursing Home Medication Administration Guide (MAG)           Med Note [name], [name]   Tue Feb 10, 2022  7:07 AM)    Ensure Plus (ENSURE PLUS) LIQD [id number]  Take 237 mLs by mouth in the morning, at noon, in the evening, and at bedtime. [provider]  Active Nursing Home Medication Administration Guide (MAG)           Med Note [name], [name]   Tue Feb 10, 2022  7:07 AM)    escitalopram (LEXAPRO) 20 MG tablet [id number]  Place 1 tablet (20 mg total) into feeding tube daily.  Patient taking differently: Take 20 mg by mouth daily.   de [name], [name], NP  Active Nursing Home Medication Administration Guide (MAG)           Med Note [name], [name]   Tue Feb 10, 2022  7:09 AM)    famotidine (PEPCID) 20 MG tablet [id number]  Take 20 mg by mouth 2 (two) times daily. [provider]  Active Nursing Home Medication Administration Guide (MAG)           Med Note [name], [name]   Tue Feb 10, 2022  7:08 AM)    HUMALOG KWIKPEN 100 UNIT/ML KwikPen [id number]  Inject 0-10 Units into the skin every 4 (four) hours. Blood glucose of 150 and below = 0 units, 151-200= 2 units, 201-250= 4 units, 251-300= 6 units, 301-350= 8 units, 351-400= 10 units, 401--999 If CBG is >400 NOTIFY MD/NP [provider]  Active Nursing Home Medication Administration Guide (MAG)  insulin aspart (NOVOLOG) 100 UNIT/ML injection [id number]  Inject 8 Units into the skin 4 (four) times daily.  Patient not taking: Reported on 12/31/2021   de [name], NP  Active Nursing Home Medication Administration Guide (MAG)  insulin detemir (LEVEMIR) 100 UNIT/ML injection [id number]  Inject 0.5 mLs (50 Units total) into the skin 2 (two) times daily.  Patient not taking: Reported on 12/08/2021   de [name], NP  Active Nursing Home  Medication Administration Guide (MAG)  insulin glargine (LANTUS) 100 UNIT/ML injection [id number]  Inject 17 Units into the skin every 12 (twelve) hours. [provider]  Active Nursing Home Medication Administration Guide (MAG)           Med Note Virgie Dad   Mon Feb 09, 2022  6:30 AM) Patient took 8 units at 2300 on 02/08/22 per patient's wife  linezolid (ZYVOX) 600 MG tablet [id number]  Place 1 tablet (600 mg total) into feeding tube every 12 (twelve) hours.  Patient not taking: Reported on 12/08/2021   de [name], NP  Active Nursing Home Medication Administration  Guide (MAG)  lisinopril (ZESTRIL) 40 MG tablet [id number]  Place 1 tablet (40 mg total) into feeding tube daily. de [name], [name], NP  Active Nursing Home Medication Administration Guide (MAG)           Med Note [name], [name]   Tue Feb 10, 2022  7:09 AM)    melatonin 3 MG TABS tablet [id number]  Take 3 mg by mouth at bedtime. [provider]  Active Nursing Home Medication Administration Guide (MAG)           Med Note [name], [name]   Tue Feb 10, 2022  7:09 AM)    metoprolol tartrate (LOPRESSOR) 25 MG tablet [id number]  Take 25 mg by mouth 2 (two) times daily. [provider]  Active Nursing Home Medication Administration Guide (MAG)           Med Note [name], [name]   Tue Feb 10, 2022  7:09 AM)    ondansetron [facility]) 4 MG/2ML SOLN injection [id number]  Inject 2 mLs (4 mg total) into the vein every 4 (four) hours as needed for nausea or vomiting.  Patient not taking: Reported on 12/31/2021   de [name], NP  Active Nursing Home Medication Administration Guide (MAG)  oxyCODONE (OXY IR/ROXICODONE) 5 MG immediate release tablet [id number]  Take 2.5 mg by mouth every 6 (six) hours as needed for pain. [provider]  Active Nursing Home Medication Administration Guide (MAG)  pantoprazole sodium (PROTONIX) 40 mg [id number]  Place 40 mg into feeding tube daily.   Patient not taking: Reported on 12/08/2021   de [name], NP  Active Nursing Home Medication Administration Guide (MAG)  polyethylene glycol (MIRALAX / GLYCOLAX) 17 g packet [id number]  Place 17 g into feeding tube daily as needed for mild constipation, moderate constipation or severe constipation. de [name], [name], NP  Active Nursing Home Medication Administration Guide (MAG)  topiramate (TOPAMAX) 50 MG tablet [id number]  Place 1 tablet (50 mg total) into feeding tube 2 (two) times daily.  Patient taking differently: Take 50 mg by mouth every 12 (twelve) hours.   de [name], [name], NP  Active Nursing Home Medication Administration Guide (MAG)           Med Note [name], [name]   Tue Feb 10, 2022  7:08 AM)    traZODone (DESYREL) 50 MG tablet [id number]  Take 25 mg by mouth at bedtime. 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 (HCC) 01/19/2022   Pneumonia of both lower lobes due to methicillin resistant Staphylococcus aureus (MRSA) (HCC)    Cerebral embolism with cerebral infarction 09/20/2021   Hypernatremia 09/19/2021   Urinary retention 09/18/2021   Fever 09/18/2021   Acute respiratory failure with hypoxia (HCC)    Cerebral edema (HCC) 09/15/2021   Acute ischemic stroke (HCC) 09/12/2021   Dysphagia following cerebral infarction 05/25/2021   Vitamin D deficiency 11/04/2020  Hypertensive urgency 10/31/2020   Hyperlipidemia 03/28/2018   Diabetes mellitus without complication (HCC) 02/23/2018   Essential hypertension  02/23/2018    Conditions to be addressed/monitored per PCP order:   DME  There are no care plans that you recently modified to display for this patient.   Follow up:  Patient agrees to Care Plan and Follow-up.  Plan: The Managed Medicaid care management team will reach out to the patient again over the next 30 days.  Date/time of next scheduled Social Work care management/care coordination outreach:  04/20/22  [name], Kenard Gower, [facility]  [facility]  High Risk Managed Medicaid Team  [phone]

## 2022-03-23 ENCOUNTER — Encounter: Payer: BC Managed Care – PPO | Admitting: Physical Medicine and Rehabilitation

## 2022-03-30 ENCOUNTER — Other Ambulatory Visit: Payer: Self-pay | Admitting: Physician Assistant

## 2022-04-06 ENCOUNTER — Telehealth: Payer: Self-pay | Admitting: Internal Medicine

## 2022-04-06 NOTE — Telephone Encounter (Signed)
His wife would like to know if the short term disability paperwork has been filled out and faxed. Did you receive his medication list?

## 2022-04-08 ENCOUNTER — Ambulatory Visit: Payer: Medicaid Other | Admitting: Podiatry

## 2022-04-08 ENCOUNTER — Encounter: Payer: Self-pay | Admitting: Podiatry

## 2022-04-08 DIAGNOSIS — M79676 Pain in unspecified toe(s): Secondary | ICD-10-CM

## 2022-04-08 DIAGNOSIS — B351 Tinea unguium: Secondary | ICD-10-CM

## 2022-04-08 NOTE — Progress Notes (Signed)
Subjective:  Patient ID: MARINO ROGERSON, male    DOB: 1975-11-23,  MRN: [id number] HPI Chief Complaint  Patient presents with   Debridement    Requesting toenail trim   New Patient (Initial Visit)    46 y.o. male presents with the above complaint.   ROS: Denies fever chills nausea vomiting muscle aches pains calf pain back pain chest pain shortness of breath.  Past Medical History:  Diagnosis Date   Allergy    Diabetes mellitus without complication (HCC)    GERD (gastroesophageal reflux disease)    Hypertension    Paralysis (HCC)    Stroke [facility])    Past Surgical History:  Procedure Laterality Date   APPENDECTOMY     BUBBLE STUDY  10/02/2021   Procedure: BUBBLE STUDY;  Surgeon: Pricilla Riffle, MD;  Location: [facility] ENDOSCOPY;  Service: Cardiovascular;;   CRANIOPLASTY Right 02/09/2022   Procedure: Craniectomy with Replacement of Bone Flap From the Abdomen;  Surgeon: Tressie Stalker, MD;  Location: [facility] OR;  Service: Neurosurgery;  Laterality: Right;   CRANIOTOMY Right 09/15/2021   Procedure: RIGHT DECOMPRESSIVE CRANIOTOMY, PLACEMENT OF BONE FLAP IN ABDOMEN;  Surgeon: Tressie Stalker, MD;  Location: [facility] OR;  Service: Neurosurgery;  Laterality: Right;   IR CT HEAD LTD  09/12/2021   IR PERCUTANEOUS ART THROMBECTOMY/INFUSION INTRACRANIAL INC DIAG ANGIO  09/12/2021   IR US GUIDE VASC ACCESS RIGHT  09/12/2021   LAPAROSCOPIC APPENDECTOMY  10/24/2013   Procedure: APPENDECTOMY LAPAROSCOPIC;  Surgeon: Kandis Cocking, MD;  Location: [facility] ORS;  Service: General;;   RADIOLOGY WITH ANESTHESIA N/A 09/12/2021   Procedure: IR WITH ANESTHESIA;  Surgeon: Julieanne Cotton, MD;  Location: [facility] OR;  Service: Radiology;  Laterality: N/A;   TEE WITHOUT CARDIOVERSION N/A 10/02/2021   Procedure: TRANSESOPHAGEAL ECHOCARDIOGRAM (TEE);  Surgeon: Pricilla Riffle, MD;  Location: [facility] ENDOSCOPY;  Service: Cardiovascular;  Laterality: N/A;    Current Outpatient Medications:    traZODone (DESYREL) 50 MG tablet, TAKE 1 TABLET BY  MOUTH AT BEDTIME FOR INSOMNIA, Disp: , Rfl:    acetaminophen (TYLENOL) 325 MG tablet, Take 650 mg by mouth every 12 (twelve) hours as needed for moderate pain., Disp: , Rfl:    amLODipine (NORVASC) 10 MG tablet, Take 1 tablet (10 mg total) by mouth daily., Disp: 30 tablet, Rfl: 0   apixaban (ELIQUIS) 5 MG TABS tablet, Take 1 tablet (5 mg total) by mouth 2 (two) times daily., Disp: , Rfl:    atorvastatin (LIPITOR) 40 MG tablet, Take 1 tablet (40 mg total) by mouth daily., Disp: 30 tablet, Rfl: 0   baclofen (LIORESAL) 10 MG tablet, Take 1 tablet (10 mg total) by mouth 3 (three) times daily., Disp: 90 each, Rfl: 0   cloNIDine (CATAPRES - DOSED IN MG/24 HR) 0.3 mg/24hr patch, Place 1 patch (0.3 mg total) onto the skin once a week., Disp: 4 patch, Rfl: 0   escitalopram (LEXAPRO) 20 MG tablet, Take 1 tablet (20 mg total) by mouth daily., Disp: 30 tablet, Rfl: 0   feeding supplement (ENSURE ENLIVE / ENSURE PLUS) LIQD, Take 237 mLs by mouth 2 (two) times daily between meals., Disp: 237 mL, Rfl: 12   insulin glargine (LANTUS) 100 UNIT/ML injection, Inject 0.1 mLs (10 Units total) into the skin daily., Disp: 10 mL, Rfl: 0   insulin lispro (HUMALOG) 100 UNIT/ML KwikPen, Inject 0-20 Units into the skin 3 (three) times daily. Inject 0-20 Units into the skin 3 (three) times daily with meals. CBG < 70 call MD. CBG  70-120 give 0 units; 121-150 give 3 units; 151-200 give 4 units, 201-250 give 7 units; 251-300 give 11 units; 301-350 give 15 units; 351-400 give 20 units; greater than 400 call MD, Disp: 15 mL, Rfl: 1   lisinopril (ZESTRIL) 40 MG tablet, Take 1 tablet (40 mg total) by mouth daily., Disp: 30 tablet, Rfl: 0   metoprolol tartrate (LOPRESSOR) 25 MG tablet, Take 1 tablet (25 mg total) by mouth 2 (two) times daily., Disp: , Rfl:    potassium chloride SA (KLOR-CON M) 20 MEQ tablet, Take 1 tablet (20 mEq total) by mouth 2 (two) times daily., Disp: 28 tablet, Rfl: 0  Allergies  Allergen Reactions    Hydrochlorothiazide Other (See Comments)    Bilateral muscle cramping   Review of Systems Objective:  There were no vitals filed for this visit.  General: Well developed, nourished, in no acute distress, alert and oriented x3   Dermatological: Skin is warm, dry and supple bilateral. Nails x 10 are well maintained though they are thick yellow dystrophic clinical mycotic; remaining integument appears unremarkable at this time. There are no open sores, no preulcerative lesions, no rash or signs of infection present.  Vascular: Dorsalis Pedis artery and Posterior Tibial artery pedal pulses are 2/4 bilateral with immedate capillary fill time. Pedal hair growth present. No varicosities and no lower extremity edema present bilateral.   Neruologic: Grossly intact via light touch right. Vibratory intact via tuning fork right. Protective threshold with Semmes Wienstein monofilament intact to all pedal sites right. Patellar and Achilles deep tendon reflexes 2+ right. No Babinski or clonus noted right.  Clonus or sharp dorsiflexion of the left foot.  Musculoskeletal: No gross boney pedal deformities right. No pain, crepitus, or limitation noted with foot and ankle range of motion bilateral. Muscular strength 5/5 in all groups tested bilateral.  Gait: Unassisted, Nonantalgic.    Radiographs:  None taken  Assessment & Plan:   Assessment: He is a stroke patient with near-total left-sided weakness.  Toenails are long thick yellow dystrophic with mycotic.  Plan: Debridement of toenails 1 through 5 bilateral.     Donovyn Guidice T. Seymour, [place]

## 2022-04-09 ENCOUNTER — Other Ambulatory Visit: Payer: Self-pay

## 2022-04-09 ENCOUNTER — Telehealth: Payer: Self-pay | Admitting: Internal Medicine

## 2022-04-09 MED ORDER — POTASSIUM CHLORIDE CRYS ER 20 MEQ PO TBCR: 20.0000 meq | EXTENDED_RELEASE_TABLET | Freq: Two times a day (BID) | ORAL | 0 refills | Status: DC

## 2022-04-09 MED ORDER — ATORVASTATIN CALCIUM 40 MG PO TABS: 40.0000 mg | ORAL_TABLET | Freq: Every day | ORAL | 0 refills | Status: DC

## 2022-04-09 MED ORDER — APIXABAN 5 MG PO TABS: 5.0000 mg | ORAL_TABLET | Freq: Two times a day (BID) | ORAL | Status: DC

## 2022-04-09 MED ORDER — INSULIN LISPRO (1 UNIT DIAL) 100 UNIT/ML (KWIKPEN): 0.0000 [IU] | PEN_INJECTOR | Freq: Three times a day (TID) | SUBCUTANEOUS | 1 refills | Status: DC

## 2022-04-09 MED ORDER — BLOOD GLUCOSE MONITOR KIT: PACK | 0 refills | Status: AC

## 2022-04-09 MED ORDER — BACLOFEN 10 MG PO TABS: 10.0000 mg | ORAL_TABLET | Freq: Three times a day (TID) | ORAL | 0 refills | Status: DC

## 2022-04-09 MED ORDER — INSULIN GLARGINE 100 UNIT/ML ~~LOC~~ SOLN: 10.0000 [IU] | Freq: Every day | SUBCUTANEOUS | 0 refills | Status: DC

## 2022-04-09 MED ORDER — AMLODIPINE BESYLATE 10 MG PO TABS: 10.0000 mg | ORAL_TABLET | Freq: Every day | ORAL | 0 refills | Status: DC

## 2022-04-09 MED ORDER — METOPROLOL TARTRATE 25 MG PO TABS: 25.0000 mg | ORAL_TABLET | Freq: Two times a day (BID) | ORAL | Status: DC

## 2022-04-09 MED ORDER — ESCITALOPRAM OXALATE 20 MG PO TABS: 20.0000 mg | ORAL_TABLET | Freq: Every day | ORAL | 0 refills | Status: DC

## 2022-04-09 MED ORDER — LISINOPRIL 40 MG PO TABS: 40.0000 mg | ORAL_TABLET | Freq: Every day | ORAL | 0 refills | Status: DC

## 2022-04-09 MED ORDER — CLONIDINE 0.3 MG/24HR TD PTWK: 0.3000 mg | MEDICATED_PATCH | TRANSDERMAL | 0 refills | Status: DC

## 2022-04-09 NOTE — Telephone Encounter (Signed)
Wife called wanting an update on Jacob [name] medication. She said she has not heard anything and he is about to run out. She also wants to know if his short term disability pw was faxed yet

## 2022-04-10 ENCOUNTER — Other Ambulatory Visit (HOSPITAL_COMMUNITY): Payer: Self-pay

## 2022-04-13 ENCOUNTER — Other Ambulatory Visit: Payer: Self-pay

## 2022-04-13 MED ORDER — APIXABAN 5 MG PO TABS: 5.0000 mg | ORAL_TABLET | Freq: Two times a day (BID) | ORAL | Status: DC

## 2022-04-13 MED ORDER — ESCITALOPRAM OXALATE 20 MG PO TABS: 20.0000 mg | ORAL_TABLET | Freq: Every day | ORAL | 0 refills | Status: DC

## 2022-04-13 MED ORDER — METOPROLOL TARTRATE 25 MG PO TABS: 25.0000 mg | ORAL_TABLET | Freq: Two times a day (BID) | ORAL | Status: DC

## 2022-04-13 MED ORDER — BACLOFEN 10 MG PO TABS: 10.0000 mg | ORAL_TABLET | Freq: Three times a day (TID) | ORAL | 0 refills | Status: DC

## 2022-04-13 MED ORDER — POTASSIUM CHLORIDE CRYS ER 20 MEQ PO TBCR: 20.0000 meq | EXTENDED_RELEASE_TABLET | Freq: Two times a day (BID) | ORAL | 0 refills | Status: DC

## 2022-04-13 MED ORDER — METOPROLOL TARTRATE 25 MG PO TABS: 25.0000 mg | ORAL_TABLET | Freq: Two times a day (BID) | ORAL | 11 refills | Status: DC

## 2022-04-13 MED ORDER — INSULIN GLARGINE 100 UNIT/ML ~~LOC~~ SOLN: 10.0000 [IU] | Freq: Every day | SUBCUTANEOUS | 0 refills | Status: DC

## 2022-04-14 ENCOUNTER — Other Ambulatory Visit: Payer: Self-pay

## 2022-04-14 MED ORDER — LANCETS ULTRA FINE MISC: 11 refills | Status: DC

## 2022-04-14 MED ORDER — BACLOFEN 10 MG PO TABS: 10.0000 mg | ORAL_TABLET | Freq: Three times a day (TID) | ORAL | 0 refills | Status: DC

## 2022-04-14 MED ORDER — RELION TRUE METRIX TEST STRIPS VI STRP: ORAL_STRIP | 12 refills | Status: DC

## 2022-04-15 ENCOUNTER — Other Ambulatory Visit: Payer: Self-pay

## 2022-04-15 MED ORDER — BACLOFEN 10 MG PO TABS: 10.0000 mg | ORAL_TABLET | Freq: Three times a day (TID) | ORAL | 0 refills | Status: DC

## 2022-04-20 ENCOUNTER — Other Ambulatory Visit: Payer: BC Managed Care – PPO

## 2022-04-20 NOTE — Patient Instructions (Signed)
Visit Information  Mr. Lax was given information about Medicaid Managed Care team care coordination services as a part of their Healthy [facility] Medicaid benefit. [name] verbally consented to engagement with the [facility] Managed Care team.   If you are experiencing a medical emergency, please call 911 or report to your local emergency department or urgent care.   If you have a non-emergency medical problem during routine business hours, please contact your provider's office and ask to speak with a nurse.   For questions related to your Healthy [facility] health plan, please call: [phone] or visit the homepage here: [email or web address]  If you would like to schedule transportation through your Healthy [facility] plan, please call the following number at least 2 days in advance of your appointment: [phone]  For information about your ride after you set it up, call Ride Assist at [phone]. Use this number to activate a Will Call pickup, or if your transportation is late for a scheduled pickup. Use this number, too, if you need to make a change or cancel a previously scheduled reservation.  If you need transportation services right away, call [phone]. The after-hours call center is staffed 24 hours to handle ride assistance and urgent reservation requests (including discharges) 365 days a year. Urgent trips include sick visits, hospital discharge requests and life-sustaining treatment.  Call the [facility] Line at [phone], at any time, 24 hours a day, 7 days a week. If you are in danger or need immediate medical attention call 911.  If you would like help to quit smoking, call 1-800-QUIT-NOW (410-370-6204) OR Espaol: 1-855-Djelo-Ya (1-696-789-3810) o para ms informacin haga clic aqu or Text READY to 175-102 to register via text  Mr. Fearnow - following are the goals we discussed in your visit today:   Goals  Addressed   None      The  Primary Caregiver has been provided with contact information for the Managed Medicaid care management team and has been advised to call with any health related questions or concerns.    [name], BSW, [place] [facility]  [place]  High Risk Managed Medicaid Team  [phone]   Following is a copy of your plan of care:  There are no care plans that you recently modified to display for this patient.

## 2022-04-20 NOTE — Patient Outreach (Signed)
Medicaid Managed Care Social Work Note  04/20/2022 Name:  Jacob [name] MRN:  [id number] DOB:  12-06-1975  [name] is an 46 y.o. year old male who is a primary patient of [name], MD.  The [facility] Managed Care Coordination team was consulted for assistance with:  Level of Care Concerns  Mr. Yan was given information about Medicaid Managed Care Coordination team services today. [name] Primary Caregiver agreed to services and verbal consent obtained.  Engaged with patient  for by telephone forfollow up visit in response to referral for case management and/or care coordination services.   Assessments/Interventions:  Review of past medical history, allergies, medications, health status, including review of consultants reports, laboratory and other test data, was performed as part of comprehensive evaluation and provision of chronic care management services.  SDOH: (Social Determinant of Health) assessments and interventions performed: BSW completed a telephone outreach with patients wife. She states the have all of the DME equipment needed for patient and no other resources or services are needed at this time.  Advanced Directives Status:  Not addressed in this encounter.  Care Plan                 Allergies  Allergen Reactions   Hydrochlorothiazide Other (See Comments)    Bilateral muscle cramping    Medications Reviewed Today     Reviewed by [name], [facility] (Certified Podiatric Assistant) on 04/08/22 at [id number]  Med List Status: <None>   Medication Order Taking? Sig Documenting Provider Last Dose Status Informant  acetaminophen (TYLENOL) 325 MG tablet [id number]  Take 650 mg by mouth every 12 (twelve) hours as needed for moderate pain. [provider]  Active Nursing Home Medication Administration Guide (MAG)  amLODipine (NORVASC) 10 MG tablet [id number]  Take 1 tablet (10 mg total) by mouth daily. Setzer, Lynnell Jude, PA-C  Active   apixaban  (ELIQUIS) 5 MG TABS tablet [id number]  Take 1 tablet (5 mg total) by mouth 2 (two) times daily. Setzer, Lynnell Jude, PA-C  Active   atorvastatin (LIPITOR) 40 MG tablet [id number]  Take 1 tablet (40 mg total) by mouth daily. Setzer, Lynnell Jude, PA-C  Active   baclofen (LIORESAL) 10 MG tablet [id number]  Take 1 tablet (10 mg total) by mouth 3 (three) times daily. Setzer, Lynnell Jude, PA-C  Active   cloNIDine (CATAPRES - DOSED IN MG/24 HR) 0.3 mg/24hr patch [id number]  Place 1 patch (0.3 mg total) onto the skin once a week. Setzer, Lynnell Jude, PA-C  Active   escitalopram (LEXAPRO) 20 MG tablet [id number]  Take 1 tablet (20 mg total) by mouth daily. Setzer, Lynnell Jude, PA-C  Active   feeding supplement (ENSURE ENLIVE / ENSURE PLUS) LIQD [id number]  Take 237 mLs by mouth 2 (two) times daily between meals. Setzer, Lynnell Jude, PA-C  Active   insulin glargine (LANTUS) 100 UNIT/ML injection [id number]  Inject 0.1 mLs (10 Units total) into the skin daily. Setzer, Lynnell Jude, PA-C  Active   insulin lispro (HUMALOG) 100 UNIT/ML KwikPen [id number]  Inject 0-20 Units into the skin 3 (three) times daily. Inject 0-20 Units into the skin 3 (three) times daily with meals. CBG < 70 call MD. CBG 70-120 give 0 units; 121-150 give 3 units; 151-200 give 4 units, 201-250 give 7 units; 251-300 give 11 units; 301-350 give 15 units; 351-400 give 20 units; greater than 400 call MD [name], PA-C  Active   lisinopril (ZESTRIL) 40 MG tablet [id number]  Take 1 tablet (40 mg total) by mouth daily. Setzer, Lynnell Jude, PA-C  Active   metoprolol tartrate (LOPRESSOR) 25 MG tablet [id number]  Take 1 tablet (25 mg total) by mouth 2 (two) times daily. Setzer, Lynnell Jude, PA-C  Active   potassium chloride SA (KLOR-CON M) 20 MEQ tablet [id number]  Take 1 tablet (20 mEq total) by mouth 2 (two) times daily. 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 (HCC) 01/19/2022   Pneumonia of both lower lobes due to methicillin resistant Staphylococcus aureus (MRSA) (HCC)    Cerebral embolism with cerebral infarction 09/20/2021   Hypernatremia 09/19/2021   Urinary retention 09/18/2021   Fever 09/18/2021   Acute respiratory failure with hypoxia (HCC)    Cerebral edema (HCC) 09/15/2021   Acute ischemic stroke (HCC) 09/12/2021   Dysphagia following cerebral infarction 05/25/2021   Vitamin D deficiency 11/04/2020   Hypertensive urgency 10/31/2020   Hyperlipidemia 03/28/2018   Diabetes mellitus without complication (HCC) 02/23/2018   Essential hypertension 02/23/2018    Conditions to be addressed/monitored per PCP order:   DME Equipment/level of care concerns  There are no care plans that you recently modified to display for this patient.   Follow up:  Patient agrees to Care Plan and Follow-up.  Plan: The  Primary Caregiver has been provided with contact information for the Managed Medicaid care management team and has been advised to call with any health related questions or concerns.    [name], BSW, [facility]  [place]  High Risk Managed Medicaid Team  [phone]

## 2022-04-21 ENCOUNTER — Other Ambulatory Visit: Payer: Self-pay

## 2022-04-21 MED ORDER — ESCITALOPRAM OXALATE 20 MG PO TABS: 20.0000 mg | ORAL_TABLET | Freq: Every day | ORAL | 1 refills | Status: DC

## 2022-05-04 ENCOUNTER — Ambulatory Visit: Payer: Medicaid Other | Admitting: Neurology

## 2022-05-06 ENCOUNTER — Ambulatory Visit: Payer: Medicaid Other | Admitting: Neurology

## 2022-05-06 ENCOUNTER — Encounter: Payer: Self-pay | Admitting: Neurology

## 2022-05-06 VITALS — BP 117/75 | HR 74 | Ht 73.0 in

## 2022-05-06 DIAGNOSIS — I63511 Cerebral infarction due to unspecified occlusion or stenosis of right middle cerebral artery: Secondary | ICD-10-CM | POA: Diagnosis not present

## 2022-05-06 DIAGNOSIS — H53462 Homonymous bilateral field defects, left side: Secondary | ICD-10-CM | POA: Diagnosis not present

## 2022-05-06 DIAGNOSIS — I69354 Hemiplegia and hemiparesis following cerebral infarction affecting left non-dominant side: Secondary | ICD-10-CM | POA: Diagnosis not present

## 2022-05-06 DIAGNOSIS — I82409 Acute embolism and thrombosis of unspecified deep veins of unspecified lower extremity: Secondary | ICD-10-CM | POA: Diagnosis not present

## 2022-05-06 NOTE — Progress Notes (Signed)
[facility] [address] [place]. [place] [address] [phone]       OFFICE FOLLOW-UP NOTE  [name] Date of Birth:  1976-02-17 Medical Record Number:  [id number]   HPI: [name] is a 46 year old Caucasian male seen today for initial office follow-up visit following hospital admission for stroke and April 2023.  He is accompanied by his wife.  History is obtained from them and review of electronic medical records.  I have personally reviewed pertinent available imaging films in PACS.JERMANI EBERLEIN is a 46 y.o. male with a history of htn and DM who presents with left sided facial droop. Initially he had arm symptoms as well. He was normal at 1:30 am, then started having symptoms and woke his wife up. He described the symptoms as being that he was holding his phone, then suddenly could tnot tell he was holding his phone any more. He woke up  his wife who called 911. EMS noted some arm weakness and severe left facial droop.  He was brought in as a code stroke, and had significant improvement en route.   On initial examination, his arm symptoms had almost completely resolved(mild slowness on sequential opposition of digits, but no drift or ataxia). He still had mild dysarthria and facial weakness, but nothing that appeared disabling. TNK was discussed, but given the mildness of symptoms was not pursued. Over the the next few minutes, it was noted that he had some fluctuation of his symptoms, from mild to severe to mild dysarthria. CTA showed R M1 occlusion. In the setting of fluctuating symptoms which were disabling at times, TNK was re-discussed and decision was made to pursue this. There was delay after this due to elevated blood pressures which needed to be treated.  Right MCA was revascularized with TICI 3 flow established.  Unfortunately the patient yet developed a large right hemispheric infarct and significant cytotoxic cerebral edema postprocedure was taken for  emergent decompressive hemicraniectomy by Dr. Arnoldo Morale with placement of the bone flap in the abdomen.  Left intubated for several days required tracheostomy to manage respiratory failure.  He developed DVT in the right peroneal vein for started on Eliquis for this..  2D echo showed ejection fraction greater than 75%.  TEE showed small intrapulmonary shunt.  Hemoglobin A1c was 8.1.  LDL cholesterol is 96 mg percent.  Was transferred to [facility] and is currently living at home.  Getting home physical and occupational therapy 1 day a week.  He still has significant left hemiplegia function on the left.  He is able to stand with help of the therapist but not able to walk yet.  He can hold onto the walker taking maybe 1 or 2 steps.  He is tolerating Eliquis well without bruising or bleeding.  He has not yet had any follow-up venous Doppler studies.  To see if his DVT has resolved.  Since had his bone flap replaced on 02/09/2022 by Dr. Arnoldo Morale follow-up CT scan of the head on 02/12/2022 showed stable appearance of his right MCA and PCA infarcts without acute abnormality.  ROS:   14 system review of systems is positive for weakness, difficulty walking, , Tiredness and all other systems negative  PMH:  Past Medical History:  Diagnosis Date   Allergy    Diabetes mellitus without complication (HCC)    GERD (gastroesophageal reflux disease)    Hypertension    Paralysis ([place])    Stroke [facility])     Social History:  Social  History   Socioeconomic History   Marital status: Married    Spouse name: Not on file   Number of children: Not on file   Years of education: Not on file   Highest education level: Not on file  Occupational History   Not on file  Tobacco Use   Smoking status: Never   Smokeless tobacco: Never  Vaping Use   Vaping Use: Never used  Substance and Sexual Activity   Alcohol use: Not Currently    Alcohol/week: 1.0 standard drink of alcohol    Types: 1 Cans of beer per week   Drug use: No    Sexual activity: Not on file  Other Topics Concern   Not on file  Social History Narrative   Not on file   Social Determinants of Health   Financial Resource Strain: Low Risk  (02/05/2022)   Overall Financial Resource Strain (CARDIA)    Difficulty of Paying Living Expenses: Not very hard  Food Insecurity: No Food Insecurity (03/18/2022)   Hunger Vital Sign    Worried About Running Out of Food in the Last Year: Never true    [place] in the Last Year: Never true  Transportation Needs: No Transportation Needs (03/18/2022)   PRAPARE - Hydrologist (Medical): No    Lack of Transportation (Non-Medical): No  Physical Activity: Not on file  Stress: Not on file  Social Connections: Not on file  Intimate Partner Violence: Not on file    Medications:   Current Outpatient Medications on File Prior to Visit  Medication Sig Dispense Refill   acetaminophen (TYLENOL) 325 MG tablet Take 650 mg by mouth every 12 (twelve) hours as needed for moderate pain.     amLODipine (NORVASC) 10 MG tablet Take 1 tablet (10 mg total) by mouth daily. 30 tablet 0   apixaban (ELIQUIS) 5 MG TABS tablet Take 1 tablet (5 mg total) by mouth 2 (two) times daily. 60 tablet    atorvastatin (LIPITOR) 40 MG tablet Take 1 tablet (40 mg total) by mouth daily. 30 tablet 0   baclofen (LIORESAL) 10 MG tablet Take 1 tablet (10 mg total) by mouth 3 (three) times daily. 90 each 0   blood glucose meter kit and supplies KIT Dispense based on patient and insurance preference. Use up to four times daily as directed. 1 each 0   cloNIDine (CATAPRES - DOSED IN MG/24 HR) 0.3 mg/24hr patch Place 1 patch (0.3 mg total) onto the skin once a week. 4 patch 0   escitalopram (LEXAPRO) 20 MG tablet Take 1 tablet (20 mg total) by mouth daily. 90 tablet 1   feeding supplement (ENSURE ENLIVE / ENSURE PLUS) LIQD Take 237 mLs by mouth 2 (two) times daily between meals. 237 mL 12   glucose blood (RELION TRUE METRIX  TEST STRIPS) test strip Use as instructed 100 each 12   insulin glargine (LANTUS) 100 UNIT/ML injection Inject 0.1 mLs (10 Units total) into the skin daily. 10 mL 0   insulin lispro (HUMALOG) 100 UNIT/ML KwikPen Inject 0-20 Units into the skin 3 (three) times daily. Inject 0-20 Units into the skin 3 (three) times daily with meals. CBG < 70 call MD. CBG 70-120 give 0 units; 121-150 give 3 units; 151-200 give 4 units, 201-250 give 7 units; 251-300 give 11 units; 301-350 give 15 units; 351-400 give 20 units; greater than 400 call MD 15 mL 1   Lancets Ultra Fine MISC Use as directed  100 each 11   lisinopril (ZESTRIL) 40 MG tablet Take 1 tablet (40 mg total) by mouth daily. 30 tablet 0   metoprolol tartrate (LOPRESSOR) 25 MG tablet Take 1 tablet (25 mg total) by mouth 2 (two) times daily. 60 tablet 11   potassium chloride SA (KLOR-CON M) 20 MEQ tablet Take 1 tablet (20 mEq total) by mouth 2 (two) times daily. 28 tablet 0   traZODone (DESYREL) 50 MG tablet TAKE 1 TABLET BY MOUTH AT BEDTIME FOR INSOMNIA     No current facility-administered medications on file prior to visit.    Allergies:   Allergies  Allergen Reactions   Hydrochlorothiazide Other (See Comments)    Bilateral muscle cramping    Physical Exam General: Frail middle-aged African-American male, seated, in no evident distress Head: head normocephalic and atraumatic.  Neck: supple with no carotid or supraclavicular bruits Cardiovascular: regular rate and rhythm, no murmurs Musculoskeletal: no deformity large surgical scar on the scalp from right hemicraniectomy Skin:  no rash/petichiae Vascular:  Normal pulses all extremities Vitals:   05/06/22 1005  BP: 117/75  Pulse: 74   Neurologic Exam Mental Status: Awake and fully alert. Oriented to place and time. Recent and remote memory intact. Attention span, concentration and fund of knowledge appropriate. Mood and affect appropriate.  Cranial Nerves: Fundoscopic exam reveals sharp  disc margins. Pupils equal, briskly reactive to light. Extraocular movements full without nystagmus. Visual fields show dense left homonymous hemianopsia to confrontation. Hearing intact. Facial sensation intact.  Left lower face weakness., tongue, palate moves normally and symmetrically.  Motor: Spastic left hemiplegia with left upper extremity 0/5 in left lower extremity 1/5 strength with left foot drop.  Tone is increased on the left with spasticity.. Sensory.: intact to touch ,pinprick .position and vibratory sensation.  Coordination: Normal on the right and unable to test on the left.   Gait and Station: Unable to test as patient is nonambulatory  reflexes: 1+ and asymmetric and brisker on the left. Toes downgoing.   NIHSS  11 Modified Rankin  4   ASSESSMENT: 46 year old African-American male with large right MCA infarct secondary to right MCA occlusion s/p IV thrombolysis with TNK followed by mechanical thrombectomy with successful revascularization with patient developing malignant cytotoxic edema requiring emergent hemicraniectomy but has significant residual spastic left hemiplegia.  Stroke etiology cryptogenic versus intracranial stenosis.  Vascular risk factors of diabetes, hypertension, hyperlipidemia atherosclerosis.     PLAN:I had a long d/w patient and his wife about his recent stroke, spastic left hemiplegia, hemicraniectomy risk for recurrent stroke/TIAs, personally independently reviewed imaging studies and stroke evaluation results and answered questions.Continue Eliquis (apixaban) daily  for secondary stroke prevention given history of DVT and maintain strict control of hypertension with blood pressure goal below 130/90, diabetes with hemoglobin A1c goal below 6.5% and lipids with LDL cholesterol goal below 70 mg/dL. I also advised the patient to eat a healthy diet with plenty of whole grains, cereals, fruits and vegetables, exercise regularly and maintain ideal body weight check  lower extremity venous Dopplers and if DVT is resolved we will consider switching Eliquis to prevent.  Followup in the future with my nurse practitioner in 6 months or call earlier if necessary. Greater than 50% of time during this prolonged 45 minute prolonged visit was spent on counseling,explanation of diagnosis, planning of further management, discussion with patient and family and coordination of care  Antony Contras, MD  Note: This document was prepared with digital dictation and possible smart phrase technology. Any  transcriptional errors that result from this process are unintentional

## 2022-05-06 NOTE — Patient Instructions (Addendum)
I had a long d/w patient and his wife about his recent stroke, spastic left hemiplegia, hemicraniectomy risk for recurrent stroke/TIAs, personally independently reviewed imaging studies and stroke evaluation results and answered questions.Continue Eliquis (apixaban) daily  for secondary stroke prevention given history of DVT and maintain strict control of hypertension with blood pressure goal below 130/90, diabetes with hemoglobin A1c goal below 6.5% and lipids with LDL cholesterol goal below 70 mg/dL. I also advised the patient to eat a healthy diet with plenty of whole grains, cereals, fruits and vegetables, exercise regularly and maintain ideal body weight check lower extremity venous Dopplers and if DVT is resolved we will consider switching Eliquis to prevent.  Followup in the future with my nurse practitioner in 6 months or call earlier if necessary.  Stroke Prevention Some medical conditions and behaviors can lead to a higher chance of having a stroke. You can help prevent a stroke by eating healthy, exercising, not smoking, and managing any medical conditions you have. Stroke is a leading cause of functional impairment. Primary prevention is particularly important because a majority of strokes are first-time events. Stroke changes the lives of not only those who experience a stroke but also their family and other caregivers. How can this condition affect me? A stroke is a medical emergency and should be treated right away. A stroke can lead to brain damage and can sometimes be life-threatening. If a person gets medical treatment right away, there is a better chance of surviving and recovering from a stroke. What can increase my risk? The following medical conditions may increase your risk of a stroke: Cardiovascular disease. High blood pressure (hypertension). Diabetes. High cholesterol. Sickle cell disease. Blood clotting disorders (hypercoagulable state). Obesity. Sleep disorders  (obstructive sleep apnea). Other risk factors include: Being older than age 86. Having a history of blood clots, stroke, or mini-stroke (transient ischemic attack, TIA). Genetic factors, such as race, ethnicity, or a family history of stroke. Smoking cigarettes or using other tobacco products. Taking birth control pills, especially if you also use tobacco. Heavy use of alcohol or drugs, especially cocaine and methamphetamine. Physical inactivity. What actions can I take to prevent this? Manage your health conditions High cholesterol levels. Eating a healthy diet is important for preventing high cholesterol. If cholesterol cannot be managed through diet alone, you may need to take medicines. Take any prescribed medicines to control your cholesterol as told by your health care provider. Hypertension. To reduce your risk of stroke, try to keep your blood pressure below 130/80. Eating a healthy diet and exercising regularly are important for controlling blood pressure. If these steps are not enough to manage your blood pressure, you may need to take medicines. Take any prescribed medicines to control hypertension as told by your health care provider. Ask your health care provider if you should monitor your blood pressure at home. Have your blood pressure checked every year, even if your blood pressure is normal. Blood pressure increases with age and some medical conditions. Diabetes. Eating a healthy diet and exercising regularly are important parts of managing your blood sugar (glucose). If your blood sugar cannot be managed through diet and exercise, you may need to take medicines. Take any prescribed medicines to control your diabetes as told by your health care provider. Get evaluated for obstructive sleep apnea. Talk to your health care provider about getting a sleep evaluation if you snore a lot or have excessive sleepiness. Make sure that any other medical conditions you have, such as  atrial fibrillation or atherosclerosis, are managed. Nutrition Follow instructions from your health care provider about what to eat or drink to help manage your health condition. These instructions may include: Reducing your daily calorie intake. Limiting how much salt (sodium) you use to 1,500 milligrams (mg) each day. Using only healthy fats for cooking, such as olive oil, canola oil, or sunflower oil. Eating healthy foods. You can do this by: Choosing foods that are high in fiber, such as whole grains, and fresh fruits and vegetables. Eating at least 5 servings of fruits and vegetables a day. Try to fill one-half of your plate with fruits and vegetables at each meal. Choosing lean protein foods, such as lean cuts of meat, poultry without skin, fish, tofu, beans, and nuts. Eating low-fat dairy products. Avoiding foods that are high in sodium. This can help lower blood pressure. Avoiding foods that have saturated fat, trans fat, and cholesterol. This can help prevent high cholesterol. Avoiding processed and prepared foods. Counting your daily carbohydrate intake.  Lifestyle If you drink alcohol: Limit how much you have to: 0-1 drink a day for women who are not pregnant. 0-2 drinks a day for men. Know how much alcohol is in your drink. In the [place] one drink equals one 12 oz bottle of beer (369mL), one 5 oz glass of wine (139mL), or one 1 oz glass of hard liquor (65mL). Do not use any products that contain nicotine or tobacco. These products include cigarettes, chewing tobacco, and vaping devices, such as e-cigarettes. If you need help quitting, ask your health care provider. Avoid secondhand smoke. Do not use drugs. Activity  Try to stay at a healthy weight. Get at least 30 minutes of exercise on most days, such as: Fast walking. Biking. Swimming. Medicines Take over-the-counter and prescription medicines only as told by your health care provider. Aspirin or blood thinners  (antiplatelets or anticoagulants) may be recommended to reduce your risk of forming blood clots that can lead to stroke. Avoid taking birth control pills. Talk to your health care provider about the risks of taking birth control pills if: You are over 19 years old. You smoke. You get very bad headaches. You have had a blood clot. Where to find more information American Stroke Association: www.strokeassociation.org Get help right away if: You or a loved one has any symptoms of a stroke. "BE FAST" is an easy way to remember the main warning signs of a stroke: B - Balance. Signs are dizziness, sudden trouble walking, or loss of balance. E - Eyes. Signs are trouble seeing or a sudden change in vision. F - Face. Signs are sudden weakness or numbness of the face, or the face or eyelid drooping on one side. A - Arms. Signs are weakness or numbness in an arm. This happens suddenly and usually on one side of the body. S - Speech. Signs are sudden trouble speaking, slurred speech, or trouble understanding what people say. T - Time. Time to call emergency services. Write down what time symptoms started. You or a loved one has other signs of a stroke, such as: A sudden, severe headache with no known cause. Nausea or vomiting. Seizure. These symptoms may represent a serious problem that is an emergency. Do not wait to see if the symptoms will go away. Get medical help right away. Call your local emergency services (911 in the [place] Do not drive yourself to the hospital. Summary You can help to prevent a stroke by eating healthy, exercising, not smoking,  limiting alcohol intake, and managing any medical conditions you may have. Do not use any products that contain nicotine or tobacco. These include cigarettes, chewing tobacco, and vaping devices, such as e-cigarettes. If you need help quitting, ask your health care provider. Remember "BE FAST" for warning signs of a stroke. Get help right away if you or a  loved one has any of these signs. This information is not intended to replace advice given to you by your health care provider. Make sure you discuss any questions you have with your health care provider. Document Revised: 12/11/2019 Document Reviewed: 12/11/2019 Elsevier Patient Education  2023 Elsevier Inc. hemicraniectomy,risk for recurrent stroke/TIAs, personally independently reviewed imaging studies and stroke evaluation results and answered questions.Continue Eliquis (apixaban) daily  for secondary stroke prevention for now given h/o DVT and maintain strict control of hypertension with blood pressure goal below 130/90, diabetes with hemoglobin A1c goal below 6.5% and lipids with LDL cholesterol goal below 70 mg/dL. I also advised the patient to eat a healthy diet with plenty of whole grains, cereals, fruits and vegetables, exercise regularly and maintain ideal body weight.  Continue home physical and Occupational Therapy.  Check lower extremity venous Doppler DVT resolved will consider changing Eliquis to aspirin.  Followup in the future with my nurse practitioner in 6 months or call earlier if necessary.

## 2022-05-09 DIAGNOSIS — E1165 Type 2 diabetes mellitus with hyperglycemia: Secondary | ICD-10-CM | POA: Diagnosis not present

## 2022-05-09 DIAGNOSIS — I69391 Dysphagia following cerebral infarction: Secondary | ICD-10-CM | POA: Diagnosis not present

## 2022-05-09 DIAGNOSIS — R1310 Dysphagia, unspecified: Secondary | ICD-10-CM | POA: Diagnosis not present

## 2022-05-09 DIAGNOSIS — I69354 Hemiplegia and hemiparesis following cerebral infarction affecting left non-dominant side: Secondary | ICD-10-CM | POA: Diagnosis not present

## 2022-05-12 ENCOUNTER — Other Ambulatory Visit: Payer: Self-pay

## 2022-05-12 ENCOUNTER — Other Ambulatory Visit: Payer: Self-pay | Admitting: Internal Medicine

## 2022-05-12 MED ORDER — ATORVASTATIN CALCIUM 40 MG PO TABS: 40.0000 mg | ORAL_TABLET | Freq: Every day | ORAL | 1 refills | Status: DC

## 2022-05-12 MED ORDER — CLONIDINE 0.3 MG/24HR TD PTWK: 0.3000 mg | MEDICATED_PATCH | TRANSDERMAL | 0 refills | Status: DC

## 2022-05-12 MED ORDER — BACLOFEN 10 MG PO TABS: 10.0000 mg | ORAL_TABLET | Freq: Three times a day (TID) | ORAL | 1 refills | Status: DC

## 2022-05-13 ENCOUNTER — Ambulatory Visit: Payer: Medicaid Other | Admitting: Internal Medicine

## 2022-05-13 ENCOUNTER — Other Ambulatory Visit: Payer: Self-pay

## 2022-05-13 ENCOUNTER — Encounter: Payer: Self-pay | Admitting: Internal Medicine

## 2022-05-13 VITALS — BP 128/82 | HR 66 | Temp 97.9°F | Resp 16 | Ht 73.0 in

## 2022-05-13 DIAGNOSIS — F321 Major depressive disorder, single episode, moderate: Secondary | ICD-10-CM | POA: Diagnosis not present

## 2022-05-13 MED ORDER — METOPROLOL TARTRATE 25 MG PO TABS: 25.0000 mg | ORAL_TABLET | Freq: Two times a day (BID) | ORAL | 11 refills | Status: DC

## 2022-05-13 MED ORDER — TRAZODONE HCL 50 MG PO TABS: 50.0000 mg | ORAL_TABLET | Freq: Every day | ORAL | 1 refills | Status: DC

## 2022-05-13 MED ORDER — SILDENAFIL CITRATE 25 MG PO TABS: 25.0000 mg | ORAL_TABLET | Freq: Every day | ORAL | 2 refills | Status: DC | PRN

## 2022-05-13 MED ORDER — ESCITALOPRAM OXALATE 20 MG PO TABS: 30.0000 mg | ORAL_TABLET | Freq: Every day | ORAL | 2 refills | Status: DC

## 2022-05-13 MED ORDER — BACLOFEN 10 MG PO TABS: 10.0000 mg | ORAL_TABLET | Freq: Three times a day (TID) | ORAL | 1 refills | Status: DC

## 2022-05-13 MED ORDER — ATORVASTATIN CALCIUM 40 MG PO TABS: 40.0000 mg | ORAL_TABLET | Freq: Every day | ORAL | 1 refills | Status: DC

## 2022-05-13 NOTE — Assessment & Plan Note (Addendum)
He had his stroke in 08/2021 and was started on depression meds in the hospital.  I want him to continue on trazodone and I am not going to add abilify due to his history of stroke.  I am going to increase his lexapro from 20mg  to 30mg  daily and continue on trazodone.  I want him to continue to work with therapies.  His wife is asking for him to be referred to psychology for therapy.

## 2022-05-13 NOTE — Addendum Note (Signed)
Addended by: [name] on: 05/13/2022 10:32 AM   Modules accepted: Orders

## 2022-05-13 NOTE — Progress Notes (Signed)
Office Visit  Subjective   Patient ID: Jacob [name]   DOB: 1976-03-11   Age: 46 y.o.   MRN: [id number]   Chief Complaint Chief Complaint  Patient presents with   Follow-up     History of Present Illness The patient returns today for a followup of depression. He has had problems with intermittent fluctuating depression since he was admitted to [facility] after his stroke.Marland Kitchen He is accompanied by his wife today that states he has moderate intensity depression with some episodes of crying. He is not having anxiety and no panic attacks. The patient is currently on Lexapro 20mg  daily and trazodone 50mg  qhs for sleep which does help. He also reports extreme feelings of guilt, feelings of isolation, feelings of worthlessness, and helpless feeling. He denies difficulty concentrating, fatigue, suicidal ideation, homicidal ideation, loss of appetite, social withdrawal, and loss of interest in pleasurable activities. This patient feels that she is unable to care for himself. He still has his left side weakness where he is wheelchair bound. He currently lives with his family. He has no significant prior history of mental health disorders.   The patient also returns where I saw him about 6-7 weeks ago for a transition of care where he completed a stay at [facility] Inpatient rehab from 02/14/2022 until 03/07/2022.  This patient is well known to me where I care for him at [facility] this past year where he had a CVA with decompressive craniectomy with cranioplasty with functional deficits.  He has residual left hemiparesis from this prior stroke.  He was admitted to inpatient rehab for PT/OT/ST.  They noted some left shoulder subluxation which was treated with taping and ROM and sling when not mobilizing.  He was started on baclofen 10mg  twice daily due to worsening left upper extremity and left lower extremity tone.  They discontinued his Ritalin during his stay as well.  They continued to manage his HTN  and diabetes without problems.  He was started on lantus for better blood sugar control.  They note that the patient had improvement in his activity tolerance, balance, postural control and ability to compensate for deficits.  They discharged him home and he is currently getting homehealth therapies at this time.         Past Medical History Past Medical History:  Diagnosis Date   Allergy    Diabetes mellitus without complication (HCC)    GERD (gastroesophageal reflux disease)    Hypertension    Paralysis (HCC)    Stroke (HCC)      Allergies Allergies  Allergen Reactions   Hydrochlorothiazide Other (See Comments)    Bilateral muscle cramping     Review of Systems Review of Systems  Constitutional:  Negative for chills and fever.  Respiratory:  Negative for cough and shortness of breath.   Cardiovascular:  Negative for chest pain, palpitations and leg swelling.  Gastrointestinal:  Negative for constipation, diarrhea, nausea and vomiting.  Musculoskeletal:  Negative for myalgias.  Skin:  Negative for itching and rash.  Neurological:  Negative for dizziness and headaches.  Psychiatric/Behavioral:  Positive for depression. The patient is not nervous/anxious and does not have insomnia.        Objective:    Vitals BP 128/82   Pulse 66   Temp 97.9 F (36.6 C)   Resp 16   Ht 6\' 1"  (1.854 m)   SpO2 99%   BMI 23.56 kg/m    Physical Examination Physical Exam  Assessment & Plan:   Current moderate episode of major depressive disorder without prior episode [facility]) He had his stroke in 08/2021 and was started on depression meds in the hospital.  I want him to continue on trazodone and I am not going to add abilify due to his history of stroke.  I am going to increase his lexapro from 20mg  to 30mg  daily and continue on trazodone.  I want him to continue to work with therapies.  His wife is asking for him to be referred to psychology for therapy.    No follow-ups on file.    Townsend Roger, MD

## 2022-05-14 ENCOUNTER — Other Ambulatory Visit: Payer: Self-pay | Admitting: Internal Medicine

## 2022-05-14 MED ORDER — CLONIDINE 0.3 MG/24HR TD PTWK: 0.3000 mg | MEDICATED_PATCH | TRANSDERMAL | 6 refills | Status: DC

## 2022-05-22 ENCOUNTER — Telehealth: Payer: Self-pay | Admitting: Internal Medicine

## 2022-05-22 NOTE — Telephone Encounter (Signed)
His wife would like him to start going to outpatient physical therapy in [place] or [place]. Can you talk to Dr.Van Eyk next week and I will send the referral. I told her it would be next week.

## 2022-05-29 ENCOUNTER — Other Ambulatory Visit: Payer: Self-pay

## 2022-05-29 ENCOUNTER — Telehealth: Payer: Self-pay | Admitting: Internal Medicine

## 2022-05-29 MED ORDER — LISINOPRIL 40 MG PO TABS: 40.0000 mg | ORAL_TABLET | Freq: Every day | ORAL | 1 refills | Status: DC

## 2022-05-29 MED ORDER — AMLODIPINE BESYLATE 10 MG PO TABS: 10.0000 mg | ORAL_TABLET | Freq: Every day | ORAL | 1 refills | Status: DC

## 2022-05-29 NOTE — Telephone Encounter (Signed)
His wife is requesting incontinent supplies, PT and OT outpatient in [place]. Can I send that?

## 2022-06-04 ENCOUNTER — Ambulatory Visit (HOSPITAL_COMMUNITY)
Admission: RE | Admit: 2022-06-04 | Discharge: 2022-06-04 | Disposition: A | Discharge status: Home / Self Care | Payer: Medicaid Other | Source: Ambulatory Visit | Attending: Neurology | Admitting: Neurology

## 2022-06-04 DIAGNOSIS — I82409 Acute embolism and thrombosis of unspecified deep veins of unspecified lower extremity: Secondary | ICD-10-CM | POA: Insufficient documentation

## 2022-06-04 NOTE — Progress Notes (Signed)
VASCULAR LAB    Bilateral lower extremity venous duplex has been performed.  See CV proc for preliminary results.   Yonatan Guitron, RVT 06/04/2022, 9:36 AM

## 2022-06-11 ENCOUNTER — Other Ambulatory Visit: Payer: Self-pay

## 2022-06-11 MED ORDER — RELION TRUE METRIX TEST STRIPS VI STRP: ORAL_STRIP | 12 refills | Status: DC

## 2022-06-14 NOTE — Progress Notes (Signed)
Kindly inform the patient that venous Doppler study of the lower extremity shows old healed clot in the peroneal vein on the right leg.  No new clot.

## 2022-06-15 ENCOUNTER — Encounter: Payer: Self-pay | Admitting: Internal Medicine

## 2022-06-15 ENCOUNTER — Ambulatory Visit: Payer: Medicaid Other | Admitting: Internal Medicine

## 2022-06-15 VITALS — BP 132/82 | HR 64 | Temp 98.1°F | Resp 16 | Ht 73.0 in | Wt 224.0 lb

## 2022-06-15 DIAGNOSIS — I10 Essential (primary) hypertension: Secondary | ICD-10-CM

## 2022-06-15 DIAGNOSIS — F330 Major depressive disorder, recurrent, mild: Secondary | ICD-10-CM | POA: Diagnosis not present

## 2022-06-15 DIAGNOSIS — E119 Type 2 diabetes mellitus without complications: Secondary | ICD-10-CM

## 2022-06-15 MED ORDER — SILDENAFIL CITRATE 50 MG PO TABS: 50.0000 mg | ORAL_TABLET | Freq: Every day | ORAL | 2 refills | Status: AC | PRN

## 2022-06-15 NOTE — Progress Notes (Signed)
Office Visit  Subjective   Patient ID: DARRIEL UTTER   DOB: 11-11-1975   Age: 47 y.o.   MRN: [id number]   Chief Complaint Chief Complaint  Patient presents with   Follow-up     History of Present Illness Mr. Therese Sarah is a 47 yo male who returns today for followup of his depression.  I did see him about a month ago where he came in with worsening depression.  Again, he had his stroke in 08/2021 and was started on depression meds in the hospital. Last month I wanted him to continue on trazodone and I increased his lexapro from 20mg  to 30mg  daily.  He has been referred for a therapist but they have not contacted him yet.  Over the interim, both the patient had his wife states that the increase in his medications has helped.  On his last visit, his depression was moderate and he was having some episodes of crying.  Over the interim, today he states his depression is mild and he is not having anymore crying.  The patient has had problems with intermittent fluctuating depression since he was admitted to [facility] after his stroke. He is not having anxiety and no panic attacks. The patient is currently on Lexapro 30mg  daily and trazodone 50mg  qhs for sleep which does help. H He denies difficulty concentrating, fatigue, suicidal ideation, homicidal ideation, loss of appetite, social withdrawal, and loss of interest in pleasurable activities. This patient feels that she is unable to care for himself. He still has his left side weakness where he is wheelchair bound. He currently lives with his family. He has no significant prior history of mental health disorders.    The patient is a 47 year old male who returns for a follow-up visit of his T2 diabetes. He states he was diagnosed with diabetes in 2005. Since the last visit, there have been no problems. He is currently on:  Lantus 12 Units subcut daily and humalog SSI TID with 11-15 units at each meal.  She is not walking as much as they would like. She  specifically denies unexplained abdominal pain, nausea or vomiting. He does check his blood sugars 3 times per day where they run 200-300.  He has no long term complications of diabetic retinopathy, nephropathy, or neuropathy.    The patient is a 47 year old male who presents for a follow-up evaluation of hypertension.  Since his last visit, there has been no problems.  The patient has been checking her blood pressure at home. The patient's blood pressure has ranged systollically 865-784'O. The patient's current medications include: amlodipine 10mg  daily, metoprolol 25mg  BID and lisinopril 40mg  daily. The patient has been tolerating her medications well. The patient denies any headache, visual changes, dizziness, lightheadness, chest pain, shortness of breath, weakness/numbness, and edema.      Past Medical History Past Medical History:  Diagnosis Date   Allergy    Diabetes mellitus without complication (HCC)    GERD (gastroesophageal reflux disease)    Hypertension    Paralysis (HCC)    Stroke (HCC)      Allergies Allergies  Allergen Reactions   Hydrochlorothiazide Other (See Comments)    Bilateral muscle cramping     Medications  Current Outpatient Medications:    acetaminophen (TYLENOL) 325 MG tablet, Take 650 mg by mouth every 12 (twelve) hours as needed for moderate pain., Disp: , Rfl:    amLODipine (NORVASC) 10 MG tablet, Take 1 tablet (10 mg total)  by mouth daily., Disp: 90 tablet, Rfl: 1   apixaban (ELIQUIS) 5 MG TABS tablet, Take 1 tablet (5 mg total) by mouth 2 (two) times daily., Disp: 60 tablet, Rfl:    atorvastatin (LIPITOR) 40 MG tablet, Take 1 tablet (40 mg total) by mouth daily., Disp: 90 tablet, Rfl: 1   baclofen (LIORESAL) 10 MG tablet, Take 1 tablet (10 mg total) by mouth 3 (three) times daily., Disp: 90 each, Rfl: 1   blood glucose meter kit and supplies KIT, Dispense based on patient and insurance preference. Use up to four times daily as directed., Disp: 1 each,  Rfl: 0   cloNIDine (CATAPRES - DOSED IN MG/24 HR) 0.3 mg/24hr patch, Place 1 patch (0.3 mg total) onto the skin once a week., Disp: 4 patch, Rfl: 6   escitalopram (LEXAPRO) 20 MG tablet, Take 1.5 tablets (30 mg total) by mouth daily., Disp: 45 tablet, Rfl: 2   feeding supplement (ENSURE ENLIVE / ENSURE PLUS) LIQD, Take 237 mLs by mouth 2 (two) times daily between meals., Disp: 237 mL, Rfl: 12   glucose blood (RELION TRUE METRIX TEST STRIPS) test strip, Use as instructed, Disp: 100 each, Rfl: 12   insulin glargine (LANTUS) 100 UNIT/ML injection, Inject 0.1 mLs (10 Units total) into the skin daily., Disp: 10 mL, Rfl: 0   insulin lispro (HUMALOG) 100 UNIT/ML KwikPen, Inject 0-20 Units into the skin 3 (three) times daily. Inject 0-20 Units into the skin 3 (three) times daily with meals. CBG < 70 call MD. CBG 70-120 give 0 units; 121-150 give 3 units; 151-200 give 4 units, 201-250 give 7 units; 251-300 give 11 units; 301-350 give 15 units; 351-400 give 20 units; greater than 400 call MD, Disp: 15 mL, Rfl: 1   Lancets Ultra Fine MISC, Use as directed, Disp: 100 each, Rfl: 11   lisinopril (ZESTRIL) 40 MG tablet, Take 1 tablet (40 mg total) by mouth daily., Disp: 90 tablet, Rfl: 1   metoprolol tartrate (LOPRESSOR) 25 MG tablet, Take 1 tablet (25 mg total) by mouth 2 (two) times daily., Disp: 60 tablet, Rfl: 11   potassium chloride SA (KLOR-CON M) 20 MEQ tablet, Take 1 tablet (20 mEq total) by mouth 2 (two) times daily., Disp: 28 tablet, Rfl: 0   traZODone (DESYREL) 50 MG tablet, Take 1 tablet (50 mg total) by mouth at bedtime., Disp: 90 tablet, Rfl: 1   Review of Systems Review of Systems  Constitutional:  Negative for chills and fever.  Eyes:  Negative for blurred vision and double vision.  Respiratory:  Negative for cough and shortness of breath.   Cardiovascular:  Negative for chest pain, palpitations and leg swelling.  Gastrointestinal:  Negative for abdominal pain, constipation, diarrhea, nausea and  vomiting.  Neurological:  Negative for dizziness and headaches.       Objective:    Vitals BP 132/82   Pulse 64   Temp 98.1 F (36.7 C)   Resp 16   Ht 6\' 1"  (1.854 m)   Wt 224 lb (101.6 kg)   SpO2 99%   BMI 29.55 kg/m    Physical Examination Physical Exam Constitutional:      Appearance: Normal appearance. He is not ill-appearing.  Cardiovascular:     Rate and Rhythm: Normal rate and regular rhythm.     Pulses: Normal pulses.     Heart sounds: No murmur heard.    No friction rub. No gallop.  Pulmonary:     Effort: Pulmonary effort is normal. No respiratory  distress.     Breath sounds: No wheezing, rhonchi or rales.  Abdominal:     General: Abdomen is flat. Bowel sounds are normal. There is no distension.     Palpations: Abdomen is soft.     Tenderness: There is no abdominal tenderness.  Musculoskeletal:     Right lower leg: No edema.     Left lower leg: No edema.  Skin:    General: Skin is warm and dry.     Findings: No rash.  Neurological:     Mental Status: He is alert and oriented to person, place, and time.  Psychiatric:        Mood and Affect: Mood normal.        Behavior: Behavior normal.        Assessment & Plan:   Essential hypertension His BP is controlled.  We will continue his current meds.  Diabetes mellitus without complication ([place]) They noted over the last month that his sugars are elevated.  I am going to set him up for a DEXCOM.  We will check a HgBA1c on him today.  Mild episode of recurrent major depressive disorder ([place]) His depression is improved from his last visit.  He will try to set up counselling. We will see him back in 3 months fasting for a yearly exam.    Return in about 3 months (around 09/14/2022) for annual.   Townsend Roger, MD

## 2022-06-15 NOTE — Assessment & Plan Note (Signed)
His BP is controlled.  We will continue his current meds. 

## 2022-06-15 NOTE — Assessment & Plan Note (Signed)
His depression is improved from his last visit.  He will try to set up counselling. We will see him back in 3 months fasting for a yearly exam.

## 2022-06-15 NOTE — Assessment & Plan Note (Signed)
They noted over the last month that his sugars are elevated.  I am going to set him up for a DEXCOM.  We will check a HgBA1c on him today.

## 2022-06-16 LAB — HEMOGLOBIN A1C
Est. average glucose Bld gHb Est-mCnc: 200 mg/dL
Hgb A1c MFr Bld: 8.6 % — ABNORMAL HIGH (ref 4.8–5.6)

## 2022-06-17 ENCOUNTER — Ambulatory Visit: Payer: Medicaid Other

## 2022-06-18 ENCOUNTER — Ambulatory Visit: Payer: Medicaid Other | Attending: Internal Medicine

## 2022-06-18 DIAGNOSIS — R2681 Unsteadiness on feet: Secondary | ICD-10-CM | POA: Diagnosis present

## 2022-06-18 DIAGNOSIS — R269 Unspecified abnormalities of gait and mobility: Secondary | ICD-10-CM | POA: Diagnosis present

## 2022-06-18 DIAGNOSIS — M6281 Muscle weakness (generalized): Secondary | ICD-10-CM | POA: Diagnosis present

## 2022-06-18 DIAGNOSIS — R278 Other lack of coordination: Secondary | ICD-10-CM

## 2022-06-18 DIAGNOSIS — R262 Difficulty in walking, not elsewhere classified: Secondary | ICD-10-CM | POA: Insufficient documentation

## 2022-06-18 NOTE — Therapy (Signed)
OUTPATIENT OCCUPATIONAL THERAPY NEURO EVALUATION  Patient Name: Jacob [name] MRN: [id number] DOB:[birth year], 47 y.o., male Today's Date: 06/21/2022  PCP: Dr. Townsend Roger REFERRING PROVIDER: Dr. Vassie Loll Eyk  END OF SESSION:  OT End of Session - 06/21/22 1246     Visit Number 1    Number of Visits 24    Date for OT Re-Evaluation 09/10/22    Progress Note Due on Visit 10    OT Start Time 0926    OT Stop Time 1025    OT Time Calculation (min) 59 min    Equipment Utilized During Treatment wc    Activity Tolerance Patient tolerated treatment well    Behavior During Therapy [facility] for tasks assessed/performed             Past Medical History:  Diagnosis Date   Allergy    Diabetes mellitus without complication ([place])    GERD (gastroesophageal reflux disease)    Hypertension    Paralysis ([place])    Stroke [facility])    Past Surgical History:  Procedure Laterality Date   APPENDECTOMY     BUBBLE STUDY  10/02/2021   Procedure: BUBBLE STUDY;  Surgeon: Fay Records, MD;  Location: [place];  Service: Cardiovascular;;   CRANIOPLASTY Right 02/09/2022   Procedure: Craniectomy with Replacement of Bone Flap From the Abdomen;  Surgeon: Newman Pies, MD;  Location: [place];  Service: Neurosurgery;  Laterality: Right;   CRANIOTOMY Right 09/15/2021   Procedure: RIGHT DECOMPRESSIVE CRANIOTOMY, PLACEMENT OF BONE FLAP IN ABDOMEN;  Surgeon: Newman Pies, MD;  Location: [place];  Service: Neurosurgery;  Laterality: Right;   IR CT HEAD LTD  09/12/2021   IR PERCUTANEOUS ART THROMBECTOMY/INFUSION INTRACRANIAL INC DIAG ANGIO  09/12/2021   IR US GUIDE VASC ACCESS RIGHT  09/12/2021   LAPAROSCOPIC APPENDECTOMY  10/24/2013   Procedure: APPENDECTOMY LAPAROSCOPIC;  Surgeon: Shann Medal, MD;  Location: [facility];  Service: General;;   RADIOLOGY WITH ANESTHESIA N/A 09/12/2021   Procedure: IR WITH ANESTHESIA;  Surgeon: Luanne Bras, MD;  Location: [place];  Service: Radiology;  Laterality: N/A;   TEE  WITHOUT CARDIOVERSION N/A 10/02/2021   Procedure: TRANSESOPHAGEAL ECHOCARDIOGRAM (TEE);  Surgeon: Fay Records, MD;  Location: [place];  Service: Cardiovascular;  Laterality: N/A;   Patient Active Problem List   Diagnosis Date Noted   Mild episode of recurrent major depressive disorder ([place]) 06/15/2022   Type 2 diabetes mellitus with hyperglycemia, with long-term current use of insulin ([place])    Debility 02/14/2022   Status post craniectomy 02/09/2022   Memory deficit following cerebral infarction 02/07/2022   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 ([place]) 01/19/2022   Pneumonia of both lower lobes due to methicillin resistant Staphylococcus aureus (MRSA) ([place])    Cerebral embolism with cerebral infarction 09/20/2021   Hypernatremia 09/19/2021   Urinary retention 09/18/2021   Fever 09/18/2021   Acute respiratory failure with hypoxia ([facility])    Cerebral edema ([place]) 09/15/2021   Acute ischemic stroke ([place]) 09/12/2021   Dysphagia following cerebral infarction 05/25/2021   Vitamin D deficiency 11/04/2020   Hypertensive urgency 10/31/2020   Hyperlipidemia 03/28/2018   Diabetes mellitus without complication ([place]) 77/82/4235   Essential hypertension 02/23/2018    ONSET DATE: April 2023  REFERRING DIAG: I63.9 (ICD-10-CM) - Cerebral infarction, unspecified   THERAPY DIAG:  Muscle weakness (generalized)  Other lack of coordination  Rationale for Evaluation and Treatment: Rehabilitation  SUBJECTIVE:   SUBJECTIVE STATEMENT: Spouse reports that pt will be  getting to therapy via transportation bus, though she will plan to be here next week. Pt accompanied by: self and significant other  PERTINENT HISTORY: Per chart, Jacob [name] is a 47 y.o. male with a history of prior CVA with decompressive craniectomy in April 2023.  He has a history of right peroneal DVT on Eliquis.  He has left hemiparesis from stroke.   Per spouse, pt spent some time in [place],  then 2 months at [employer] (SNF). He presented for elective cranioplasty in Sept 2023.  Pt was admitted to rehab 02/14/2022 for inpatient therapies following cranioplasty, and participated in [facility] therapies until recently.      PRECAUTIONS: Fall  WEIGHT BEARING RESTRICTIONS: No  PAIN:  Are you having pain? Yes: NPRS scale: 5/10 Pain location: L shoulder sublux Pain description: achy Aggravating factors: movement of the L shoulder  Relieving factors: lying down and gentle stretching   FALLS: Has patient fallen in last 6 months? Yes. Number of falls 1  LIVING ENVIRONMENT: Lives with: lives with their spouse and 16 y/o son, 1 cousin, and 1 friend Lives in: 2 level with master bedroom on the main level, pt resides only on 1st level Stairs: 1 step no rail to enter, pt does have small ramp Has following equipment at home: Hemi walker, Wheelchair (manual), Shower bench, bed side commode, Grab bars, Ramped entry, and platform walker (grab bar by the toilet but not in the shower.  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  PATIENT GOALS: Pt wants to get back to walking and being as indep as possible.   OBJECTIVE:   HAND DOMINANCE: Right  ADLs: Overall ADLs: HHA Mon-Fri 5 hours/day; spouse is pt's primary caregiver Transfers/ambulation related to ADLs: min A sit to stand pivot from wc using gait belt  Eating: assist to cut food  Grooming: min A to manage facial hair UB Dressing: max A LB Dressing: max-dep A Toileting: dep, wears adult briefs.  Spouse reports pt has just started transitioning to use of BSC over the toilet vs voiding in a brief Bathing: max A with sponge/bed bath (in the process of getting bathroom re-done to enable pt to take a shower) Tub Shower transfers: N/A at this time; pt states he doesn't trust himself with the glass doors. Equipment:  see above  IADLs: Shopping: dep Light housekeeping:  dep Meal Prep: dep Community mobility: wc dep  Medication management: dep Financial management: dep Handwriting:  Pt is R hand dominant, NT  MOBILITY STATUS:  Pt reports he can amb with hemi-walker 10-20 ft with max A (per spouse) and someone following with a wc behind him  POSTURE COMMENTS:  rounded shoulders, forward head, and L sided hemiplegia (L shoulder subluxation ~1 thumb breadth) Sitting balance:  able to sit edge of mat and tolerate moderate perturbation from all directions.  Pt can reach forward to ankles and return to midline without LOB.  FUNCTIONAL OUTCOME MEASURES: FOTO: 20; predicted 29  UPPER EXTREMITY ROM:    Passive ROM Right Eval (A/PROMWNL) Left eval  Shoulder flexion  78  Shoulder abduction  60  Shoulder adduction    Shoulder extension    Shoulder internal rotation    Shoulder external rotation  -10 (elbow at side)  Elbow flexion    Elbow extension    Wrist flexion  70  Wrist extension  48  Wrist ulnar deviation    Wrist radial deviation  Wrist pronation  90  Wrist supination  60  (Blank rows = not tested)  L hand passively opens ~75% of full extension d/t PIP and MP tightness in all digits.  UPPER EXTREMITY MMT:     MMT Right eval Left eval  Shoulder flexion 5 0  Shoulder abduction 5 0  Shoulder adduction    Shoulder extension    Shoulder internal rotation    Shoulder external rotation    Middle trapezius    Lower trapezius    Elbow flexion 5 0  Elbow extension 5 0  Wrist flexion 5 0  Wrist extension 5 0  Wrist ulnar deviation    Wrist radial deviation    Wrist pronation    Wrist supination    (Blank rows = not tested)  HAND FUNCTION: Grip strength: Right: 95 lbs; Left: NT lbs, Lateral pinch: Right: 25 lbs, Left: NT lbs, and 3 point pinch: Right: 20 lbs, Left: NT lbs  COORDINATION: 9 Hole Peg test: Right: NT sec; Left: unable sec  SENSATION: LUE: Light touch: Impaired  Proprioception: Impaired   EDEMA: None  MUSCLE  TONE: LUE: Severe and Hypertonic sublux 1 thumb breadth  COGNITION: Overall cognitive status:  Decreased awareness of deficits  VISION: Subjective report: Pt reports decreased peripheral vision on the L Baseline vision: No visual deficits Visual history: N/A  VISION ASSESSMENT: Tracking/Visual pursuits: Decreased smoothness of eye movement to Left superior field, Decreased smoothness of eye movement to Left inferior field, and Requires cues, head turns, or add eye shifts to track Saccades: additional eye shifts occurred during testing and additional head turns occurred during testing Visual Fields: Left homonymous hemianopsia  PERCEPTION: Not tested, will assess in functional contexts with additional visits  PRAXIS: Impaired: Motor planning  OBSERVATIONS: L hand rests in flexion.  Noted slight odor originating from palm/between fingers.  Skin on volar hand appears to have some yeast.  Encouraged pt/spouse bring hand splint to all sessions to work towards opening up hand for improving skin integrity.  Spouse will bring next week.  TODAY'S TREATMENT:                                                                                                                              Evaluation completed.  PATIENT EDUCATION: Education details: OT role, goals, poc Person educated: Patient and Spouse Education method: Explanation and Verbal cues Education comprehension: verbalized understanding and needs further education  HOME EXERCISE PROGRAM: Will initiate within the next 1-2 sessions   GOALS: Goals reviewed with patient? Yes  SHORT TERM GOALS: Target date: 07/30/22 (6 weeks)  Pt/caregivers will demo indep with HEP for reducing pain and stiffness throughout the LUE. Baseline: Not yet initiated Goal status: INITIAL  2.  Pt/caregivers will demo supportive positioning techniques with min vc to support L hemiparetic limb. Baseline: Educ not yet initiated Goal status: INITIAL  3.   Pt/caregivers will follow L hand splinting schedule with at last 50% compliance  in order to reduce contracture risk and to optimize skin integrity of L hand. Baseline: Not yet initiated.   Goal status: INITIAL   LONG TERM GOALS: Target date: 09/10/22 (12 weeks)  Pt will increase FOTO score to 29 or more to indicate increased indep with daily tasks.   Baseline: Eval: 20 (predicted 29) Goal status: INITIAL  2.  Pt will transfer wc<>BSC with min guard Baseline: min-mod A Goal status: INITIAL  3.  Pt will don/doff shirt with min A Baseline: Mod-max A Goal status: INITIAL  4.  Pt will manage clothing for toileting with min A Baseline: max A-dep Goal status: INITIAL  5.  Pt will increase L shoulder strength by 1 MM grade to assist with repositioning L upper arm away from side for underarm care and donning shirt. Baseline: L shoulder 0/5 Goal status: INITIAL  6.  Through manual therapy, neuro re-ed, and therapeutic exercises, pt will report 3/10 pain or less throughout the LUE to increase tolerance for engaging the LUE into ADLs.   Baseline: LUE 5/10 pain Goal status: INITIAL  ASSESSMENT:  CLINICAL IMPRESSION: Patient is a 47 y.o. male who was seen today for occupational therapy evaluation for CVA.   Pt presents with severe LUE hypertonicity, L hemianopsia, sensory impairment throughout the LUE, and L shoulder subluxation ~1 thumb breadth, reporting pain of 5/10 in the shoulder with repositioning of the limb.  Pt resides with spouse, 6 y/o son, and a friend and cousin, as well as having a HHA to assist with self care Mon-Fri for 5 hours each day.  With outpatient therapy participation, pt and spouse would like to maximize pt's indep with ADLs, improve mobility, and work towards increased use of the LUE.  Pt will benefit from skilled OT to address the above noted deficits.    PERFORMANCE DEFICITS: in functional skills including ADLs, IADLs, coordination, dexterity, proprioception,  sensation, tone, ROM, strength, pain, flexibility, Fine motor control, Gross motor control, mobility, balance, body mechanics, decreased knowledge of use of DME, skin integrity, vision, and UE functional use, cognitive skills including perception and safety awareness, and psychosocial skills including coping strategies and routines and behaviors.   IMPAIRMENTS: are limiting patient from ADLs, IADLs, leisure, and social participation.   CO-MORBIDITIES: may have co-morbidities  that affects occupational performance. Patient will benefit from skilled OT to address above impairments and improve overall function.  MODIFICATION OR ASSISTANCE TO COMPLETE EVALUATION: No modification of tasks or assist necessary to complete an evaluation.  OT OCCUPATIONAL PROFILE AND HISTORY: Problem focused assessment: Including review of records relating to presenting problem.  CLINICAL DECISION MAKING: Moderate - several treatment options, min-mod task modification necessary  REHAB POTENTIAL: Good  EVALUATION COMPLEXITY: High    PLAN:  OT FREQUENCY: 2x/week  OT DURATION: 12 weeks  PLANNED INTERVENTIONS: self care/ADL training, therapeutic exercise, therapeutic activity, neuromuscular re-education, manual therapy, passive range of motion, balance training, functional mobility training, splinting, electrical stimulation, moist heat, cryotherapy, patient/family education, cognitive remediation/compensation, visual/perceptual remediation/compensation, psychosocial skills training, coping strategies training, and DME and/or AE instructions  RECOMMENDED OTHER SERVICES: N/A  CONSULTED AND AGREED WITH PLAN OF CARE: Patient and family member/caregiver  PLAN FOR NEXT SESSION: see plan  Danelle Earthly, MS, OTR/L  Otis Dials, OT 06/21/2022, 12:48 PM

## 2022-06-19 ENCOUNTER — Other Ambulatory Visit: Payer: Self-pay

## 2022-06-19 MED ORDER — DEXCOM G7 RECEIVER DEVI: 1 refills | Status: DC

## 2022-06-19 MED ORDER — DEXCOM G7 SENSOR MISC: 3 refills | Status: DC

## 2022-06-21 ENCOUNTER — Other Ambulatory Visit: Payer: Self-pay | Admitting: Internal Medicine

## 2022-06-22 ENCOUNTER — Telehealth: Payer: Self-pay

## 2022-06-22 NOTE — Telephone Encounter (Signed)
Pts wife called in saying that the pt started feeling bad on Friday 01/26 and had fevers over the weekend that were controlled with OTC medication.. Pt had no more fevers after yesterday. Pts wife is concerned about a cough that has gotten worse and is wheezing and is concerned about aspiration pneumonia. Explained to pt that if concerned about aspiration urgent care could perform CXR. Explained we could add him to the schedule this afternoon but would need to be approved by Dr.Van Eyk due to schedule. Pts wife understood and would call to urgent cares around and if not just call our office back to schedule for this afternoon if possible.

## 2022-06-22 NOTE — Therapy (Signed)
OUTPATIENT PHYSICAL THERAPY NEURO EVALUATION   Patient Name: Jacob [name] MRN: [id number] DOB:[birth year], 47 y.o., male Today's Date: 06/23/2022   PCP: Dr. Townsend Roger REFERRING PROVIDER: Dr. Vassie Loll Eyk  END OF SESSION:  PT End of Session - 06/23/22 0826     Visit Number 1    Number of Visits 24    Date for PT Re-Evaluation 09/15/22    Authorization Type [place] [employer]    Progress Note Due on Visit 10    PT Start Time 0820    PT Stop Time 0910    PT Time Calculation (min) 50 min    Equipment Utilized During Treatment Gait belt    Activity Tolerance Patient tolerated treatment well    Behavior During Therapy WFL for tasks assessed/performed             Past Medical History:  Diagnosis Date   Allergy    Diabetes mellitus without complication ([place])    GERD (gastroesophageal reflux disease)    Hypertension    Paralysis ([place])    Stroke [facility])    Past Surgical History:  Procedure Laterality Date   APPENDECTOMY     BUBBLE STUDY  10/02/2021   Procedure: BUBBLE STUDY;  Surgeon: Fay Records, MD;  Location: [place];  Service: Cardiovascular;;   CRANIOPLASTY Right 02/09/2022   Procedure: Craniectomy with Replacement of Bone Flap From the Abdomen;  Surgeon: Newman Pies, MD;  Location: [place];  Service: Neurosurgery;  Laterality: Right;   CRANIOTOMY Right 09/15/2021   Procedure: RIGHT DECOMPRESSIVE CRANIOTOMY, PLACEMENT OF BONE FLAP IN ABDOMEN;  Surgeon: Newman Pies, MD;  Location: [place];  Service: Neurosurgery;  Laterality: Right;   IR CT HEAD LTD  09/12/2021   IR PERCUTANEOUS ART THROMBECTOMY/INFUSION INTRACRANIAL INC DIAG ANGIO  09/12/2021   IR US GUIDE VASC ACCESS RIGHT  09/12/2021   LAPAROSCOPIC APPENDECTOMY  10/24/2013   Procedure: APPENDECTOMY LAPAROSCOPIC;  Surgeon: Shann Medal, MD;  Location: [facility];  Service: General;;   RADIOLOGY WITH ANESTHESIA N/A 09/12/2021   Procedure: IR WITH ANESTHESIA;  Surgeon: Luanne Bras, MD;  Location: [place];   Service: Radiology;  Laterality: N/A;   TEE WITHOUT CARDIOVERSION N/A 10/02/2021   Procedure: TRANSESOPHAGEAL ECHOCARDIOGRAM (TEE);  Surgeon: Fay Records, MD;  Location: [place];  Service: Cardiovascular;  Laterality: N/A;   Patient Active Problem List   Diagnosis Date Noted   Mild episode of recurrent major depressive disorder ([place]) 06/15/2022   Type 2 diabetes mellitus with hyperglycemia, with long-term current use of insulin ([place])    Debility 02/14/2022   Status post craniectomy 02/09/2022   Memory deficit following cerebral infarction 02/07/2022   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 ([place]) 01/19/2022   Pneumonia of both lower lobes due to methicillin resistant Staphylococcus aureus (MRSA) ([place])    Cerebral embolism with cerebral infarction 09/20/2021   Hypernatremia 09/19/2021   Urinary retention 09/18/2021   Fever 09/18/2021   Acute respiratory failure with hypoxia ([place])    Cerebral edema ([place]) 09/15/2021   Acute ischemic stroke ([place]) 09/12/2021   Dysphagia following cerebral infarction 05/25/2021   Vitamin D deficiency 11/04/2020   Hypertensive urgency 10/31/2020   Hyperlipidemia 03/28/2018   Diabetes mellitus without complication ([place]) 23/55/7322   Essential hypertension 02/23/2018    ONSET DATE: 08/2021  REFERRING DIAG:  Diagnosis  I63.9 (ICD-10-CM) - Cerebral infarction, unspecified  R53.81 (ICD-10-CM) - Other malaise    THERAPY DIAG:  Abnormality of gait and mobility  Difficulty in  walking, not elsewhere classified  Muscle weakness (generalized)  Unsteadiness on feet  Other lack of coordination  Rationale for Evaluation and Treatment: Rehabilitation  SUBJECTIVE:                                                                                                                                                                                             SUBJECTIVE STATEMENT: Patient reports he had a stroke back in April and been  mostly in a wheelchair ever since. States he wanted to be more independent and improve his mobility. Pt accompanied by: significant other- Wife  PERTINENT HISTORY: Patient is a 47 year old male with recent history of CVA  in April 2023 and then elective cranioplasty in September 2023 with subsequent inpatient rehab and then home health services. Patient has left hemiparesis.   PAIN:  Are you having pain? No  PRECAUTIONS: Fall  WEIGHT BEARING RESTRICTIONS: No  FALLS: Has patient fallen in last 6 months? Yes. 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   RADIATION DOSE REDUCTION: This exam was performed according to the departmental dose-optimization program which includes automated exposure control, adjustment of the mA and/or kV according to patient size and/or use of iterative reconstruction technique.   COMPARISON:  09/19/2021   FINDINGS: Brain: Diffuse cytotoxic edema is noted throughout the distribution of the right MCA and PCA watershed region similar to that seen on the prior exam. No parenchymal hemorrhage is seen. No new focal infarct is seen.    Vascular: No hyperdense vessel or unexpected calcification.   Skull: Large calvarial defect there is noted on the right stable from the prior study.   Sinuses/Orbits: Paranasal sinuses are within normal limits.   Other: Gastric catheter and feeding catheter are noted extending through the nose.   IMPRESSION: Right MCA/PCA infarct stable  in appearance from the previous exam. No acute hemorrhage is noted.   Right-sided hemi craniectomy stable from the prior exam. No new focal abnormality is noted.     Electronically Signed   By: Inez Catalina M.D.   On: 09/24/2021 02:47  COGNITION: Overall cognitive status: Impaired-decreased awareness of functional limitations   SENSATION: Light touch: Impaired - decreased  COORDINATION:  Left side hemiparesis  EDEMA:  None observed  MUSCLE TONE: LLE: Hypotonic   POSTURE: rounded shoulders and forward head  LOWER EXTREMITY ROM:     Active  Right Eval Left Eval  Hip flexion    Hip extension    Hip abduction    Hip adduction    Hip internal rotation    Hip external rotation    Knee flexion    Knee extension    Ankle dorsiflexion    Ankle plantarflexion    Ankle inversion    Ankle eversion     (Blank rows = not tested)  LOWER EXTREMITY MMT:    MMT Right Eval Left Eval  Hip flexion 5 2-  Hip extension 5 2-  Hip abduction 5 2-  Hip adduction 5 2-  Hip internal rotation 5 2-  Hip external rotation 5 2-  Knee flexion 5 2-  Knee extension 5 2-  Ankle dorsiflexion 5  0   Ankle plantarflexion    Ankle inversion    Ankle eversion    (Blank rows = not tested)  BED MOBILITY:  Not tested  TRANSFERS: Assistive device utilized: Hemi walker  Sit to stand: Mod A Stand to sit: Mod A Chair to chair: Mod A Floor:  Not tested   STAIRS: *Patient adamantly wanted to attempt today Level of Assistance: Max A Stair Negotiation Technique: Step to Pattern with Bilateral Rails Number of Stairs: 1  Height of Stairs:  6"  Comments: Patient wanted to attempt steps continuously requesting to try- He was able to stand from w/c with mod assist using right arm for support on armrest of chair with gait belt. Once in standing he was able to step up with right LE with physical assist to block his left LE yet able to flex left hip/knee enough to clear 1st step- positioned back in chair.   GAIT: Gait pattern: step to pattern Distance walked: 8 feet Assistive device utilized: Hemi walker Level of assistance: Mod A Comments: Patient required VC for seqencing and use of left AFO today. - fatigues very quickly and left knee buckling with weight bearing.   FUNCTIONAL TESTS:  5 times sit to stand: Patient required mod Assist to stand and unable to complete on his own.  PATIENT SURVEYS:  FOTO 27; predicted goal= 42  TODAY'S TREATMENT:                                                                                                                              DATE: Physical Therapy Evaluation and addition of some LE  strengthening exercises   PATIENT EDUCATION: Education details: PT plan of care and HEP Person educated: Patient and Spouse Education method: Explanation, Demonstration, Tactile cues, Verbal cues, and Handouts Education comprehension: verbalized understanding, returned demonstration, verbal cues required, tactile cues required, and needs further education  HOME EXERCISE PROGRAM: Access Code: [email or web address] URL: [email or web address] Date: 06/23/2022 Prepared by: Maureen Ralphs  Exercises - Supine Quad Set  - 1 x daily - 7 x weekly - 3 sets - 10 reps - Seated Long Arc Quad  - 1 x daily - 7 x weekly - 3 sets - 10 reps - Seated March  - 1 x daily - 7 x weekly - 3 sets - 10 reps - Supine Bridge  - 1 x daily - 7 x weekly - 3 sets - 10 reps - Sit to Stand with Counter Support  - 1 x daily - 7 x weekly - 3 sets - 10 reps  GOALS: Goals reviewed with patient? Yes  SHORT TERM GOALS: Target  date: 08/04/2022  Pt will be independent with HEP in order to improve strength and balance in order to decrease fall risk and improve function at home and work.  Baseline: EVAL-Patient with no formal HEP in place Goal status: INITIAL  2.  Patient will perform all stand pivot transfers with min Assist using hemi-walker for improved functional independence with mobility in home.  Baseline: EVAL= Mod A with transfers Goal status: INITIAL    LONG TERM GOALS: Target date: 09/15/2022  Pt will improve FOTO to target score of 47  to display perceived improvements in ability to complete ADL's.  Baseline: EVAL= 36 Goal status: INITIAL  2.  Pt will achieve 5x STS in less than 60 seconds in order to demonstrate clinically significant improvement in LE strength. Baseline: EVAL- Mod A with sit to stand transfer Goal status: INITIAL INITIAL 3.  Patient will ambulate > 50 feet with hemiwalker using step to gait and CGA for improved household mobility.  Baseline: EVAL- 8 feet with hemiwalker and mod A Goal status: INITIAL  4.  Patient will perform all transfers with CGA  including w/c to various surfaces- toilet, bed, chairs using hemiwalker for improved functional independence with mobility.  Baseline:  EVAL- Mod asst with all transfers Goal status: INITIAL   ASSESSMENT:  CLINICAL IMPRESSION: Patient is a 47 y.o. Male who was seen today for physical therapy evaluation and treatment for diagnosis of Cerebral Infarction and malaise. He presents with left sided weakness- non functional left UE and very weak left LE. He presents with impaired functional mobility requiring increased assist with transfers and walking placing him at a high risk of falling. He will benefit from skilled PT services to improve his LE strength and improve his ability to transfer and walk for optimal independence in the home and decreased risk of falling.     OBJECTIVE IMPAIRMENTS: Abnormal gait, cardiopulmonary status  limiting activity, decreased activity tolerance, decreased balance, decreased cognition, decreased coordination, decreased endurance, decreased mobility, difficulty walking, decreased strength, decreased safety awareness, impaired perceived functional ability, impaired sensation, and impaired UE functional use.   ACTIVITY LIMITATIONS: carrying, lifting, bending, standing, squatting, stairs, transfers, and bed mobility  PARTICIPATION LIMITATIONS: meal prep, cleaning, laundry, driving, shopping, community activity, occupation, and yard work  PERSONAL FACTORS: Time since onset of injury/illness/exacerbation and 1-2 comorbidities: HTN, DM  are also affecting patient's functional outcome.   REHAB POTENTIAL: Good  CLINICAL DECISION MAKING: Evolving/moderate complexity  EVALUATION COMPLEXITY: Moderate  PLAN:  PT FREQUENCY: 1-2x/week  PT DURATION: 12 weeks  PLANNED INTERVENTIONS: Therapeutic exercises, Therapeutic activity, Neuromuscular re-education, Balance training, Gait training, Patient/Family education, Self Care, Joint mobilization, Stair training, Orthotic/Fit training, DME instructions, Dry Needling, Electrical stimulation, Wheelchair mobility training, Spinal mobilization, Cryotherapy, Moist heat, Splintting, Taping, and Manual therapy  PLAN FOR NEXT SESSION: Review and progress LE strengthening. Transfer training for safe mobility, Gait training with hemiwalker and close follow with w/c. Added to HEP as appropriate.    Lewis Moccasin, PT 06/23/2022, 1:17 PM

## 2022-06-23 ENCOUNTER — Ambulatory Visit: Payer: Medicaid Other

## 2022-06-23 ENCOUNTER — Ambulatory Visit: Payer: Medicaid Other | Admitting: Physical Therapy

## 2022-06-23 ENCOUNTER — Ambulatory Visit: Payer: Medicaid Other | Admitting: Occupational Therapy

## 2022-06-23 ENCOUNTER — Other Ambulatory Visit: Payer: Self-pay

## 2022-06-23 DIAGNOSIS — R2681 Unsteadiness on feet: Secondary | ICD-10-CM

## 2022-06-23 DIAGNOSIS — M6281 Muscle weakness (generalized): Secondary | ICD-10-CM | POA: Diagnosis not present

## 2022-06-23 DIAGNOSIS — R269 Unspecified abnormalities of gait and mobility: Secondary | ICD-10-CM

## 2022-06-23 DIAGNOSIS — R278 Other lack of coordination: Secondary | ICD-10-CM

## 2022-06-23 DIAGNOSIS — R262 Difficulty in walking, not elsewhere classified: Secondary | ICD-10-CM

## 2022-06-23 MED ORDER — METFORMIN HCL 500 MG PO TABS: 500.0000 mg | ORAL_TABLET | Freq: Two times a day (BID) | ORAL | 1 refills | Status: DC

## 2022-06-23 NOTE — Progress Notes (Signed)
He needs to continue his current meds but his A1c is not controlled. Start him on metformin ER 500mg  BID.  Pts wife notified of lab results. Medication updated in chart and sent to pharmacy.

## 2022-06-24 ENCOUNTER — Other Ambulatory Visit: Payer: Self-pay

## 2022-06-24 MED ORDER — BACLOFEN 10 MG PO TABS: 10.0000 mg | ORAL_TABLET | Freq: Three times a day (TID) | ORAL | 3 refills | Status: DC

## 2022-06-26 ENCOUNTER — Ambulatory Visit: Payer: Medicaid Other | Attending: Internal Medicine | Admitting: Physical Therapy

## 2022-06-26 ENCOUNTER — Encounter: Payer: Self-pay | Admitting: Physical Therapy

## 2022-06-26 ENCOUNTER — Ambulatory Visit: Payer: Medicaid Other

## 2022-06-26 DIAGNOSIS — M6281 Muscle weakness (generalized): Secondary | ICD-10-CM | POA: Diagnosis present

## 2022-06-26 DIAGNOSIS — R2681 Unsteadiness on feet: Secondary | ICD-10-CM | POA: Diagnosis present

## 2022-06-26 DIAGNOSIS — R269 Unspecified abnormalities of gait and mobility: Secondary | ICD-10-CM | POA: Insufficient documentation

## 2022-06-26 DIAGNOSIS — R278 Other lack of coordination: Secondary | ICD-10-CM | POA: Insufficient documentation

## 2022-06-26 DIAGNOSIS — R262 Difficulty in walking, not elsewhere classified: Secondary | ICD-10-CM | POA: Insufficient documentation

## 2022-06-26 NOTE — Therapy (Signed)
OUTPATIENT PHYSICAL THERAPY NEURO TREATMENT   Patient Name: Jacob [name] MRN: [id number] DOB:January 06, 1976, 47 y.o., male Today's Date: 06/26/2022   PCP: Dr. [name] REFERRING PROVIDER: Dr. Michel Santee Eyk  END OF SESSION:  PT End of Session - 06/26/22 0924     Visit Number 2    Number of Visits 24    Date for PT Re-Evaluation 09/15/22    Authorization Type [place] Medicaid    Progress Note Due on Visit 10    Equipment Utilized During Treatment Gait belt    Activity Tolerance Patient tolerated treatment well    Behavior During Therapy [facility] for tasks assessed/performed              Past Medical History:  Diagnosis Date   Allergy    Diabetes mellitus without complication (HCC)    GERD (gastroesophageal reflux disease)    Hypertension    Paralysis (HCC)    Stroke [facility])    Past Surgical History:  Procedure Laterality Date   APPENDECTOMY     BUBBLE STUDY  10/02/2021   Procedure: BUBBLE STUDY;  Surgeon: Pricilla Riffle, MD;  Location: [facility] ENDOSCOPY;  Service: Cardiovascular;;   CRANIOPLASTY Right 02/09/2022   Procedure: Craniectomy with Replacement of Bone Flap From the Abdomen;  Surgeon: Tressie Stalker, MD;  Location: [facility] OR;  Service: Neurosurgery;  Laterality: Right;   CRANIOTOMY Right 09/15/2021   Procedure: RIGHT DECOMPRESSIVE CRANIOTOMY, PLACEMENT OF BONE FLAP IN ABDOMEN;  Surgeon: Tressie Stalker, MD;  Location: [facility] OR;  Service: Neurosurgery;  Laterality: Right;   IR CT HEAD LTD  09/12/2021   IR PERCUTANEOUS ART THROMBECTOMY/INFUSION INTRACRANIAL INC DIAG ANGIO  09/12/2021   IR US GUIDE VASC ACCESS RIGHT  09/12/2021   LAPAROSCOPIC APPENDECTOMY  10/24/2013   Procedure: APPENDECTOMY LAPAROSCOPIC;  Surgeon: Kandis Cocking, MD;  Location: [facility] ORS;  Service: General;;   RADIOLOGY WITH ANESTHESIA N/A 09/12/2021   Procedure: IR WITH ANESTHESIA;  Surgeon: Julieanne Cotton, MD;  Location: [facility] OR;  Service: Radiology;  Laterality: N/A;   TEE WITHOUT CARDIOVERSION N/A 10/02/2021    Procedure: TRANSESOPHAGEAL ECHOCARDIOGRAM (TEE);  Surgeon: Pricilla Riffle, MD;  Location: [facility] ENDOSCOPY;  Service: Cardiovascular;  Laterality: N/A;   Patient Active Problem List   Diagnosis Date Noted   Mild episode of recurrent major depressive disorder (HCC) 06/15/2022   Type 2 diabetes mellitus with hyperglycemia, with long-term current use of insulin (HCC)    Debility 02/14/2022   Status post craniectomy 02/09/2022   Memory deficit following cerebral infarction 02/07/2022   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 (HCC) 01/19/2022   Pneumonia of both lower lobes due to methicillin resistant Staphylococcus aureus (MRSA) (HCC)    Cerebral embolism with cerebral infarction 09/20/2021   Hypernatremia 09/19/2021   Urinary retention 09/18/2021   Fever 09/18/2021   Acute respiratory failure with hypoxia (HCC)    Cerebral edema (HCC) 09/15/2021   Acute ischemic stroke (HCC) 09/12/2021   Dysphagia following cerebral infarction 05/25/2021   Vitamin D deficiency 11/04/2020   Hypertensive urgency 10/31/2020   Hyperlipidemia 03/28/2018   Diabetes mellitus without complication (HCC) 02/23/2018   Essential hypertension 02/23/2018    ONSET DATE: 08/2021  REFERRING DIAG:  Diagnosis  I63.9 (ICD-10-CM) - Cerebral infarction, unspecified  R53.81 (ICD-10-CM) - Other malaise    THERAPY DIAG:  Abnormality of gait and mobility  Difficulty in walking, not elsewhere classified  Muscle weakness (generalized)  Unsteadiness on feet  Rationale for Evaluation and Treatment: Rehabilitation  SUBJECTIVE:  SUBJECTIVE STATEMENT: Patient reports no significant changes since last session.  Patient reports no falls or loss of balance and home exercise program he was provided last session is going  well. Pt accompanied by: significant other- Wife  PERTINENT HISTORY: Patient is a 47 year old male with recent history of CVA  in April 2023 and then elective cranioplasty in September 2023 with subsequent inpatient rehab and then home health services. Patient has left hemiparesis.   PAIN:  Are you having pain? No  PRECAUTIONS: Fall  WEIGHT BEARING RESTRICTIONS: No  FALLS: Has patient fallen in last 6 months? Yes. 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   RADIATION DOSE REDUCTION: This exam was performed according to the departmental dose-optimization program which includes automated exposure control, adjustment of the mA and/or kV according to patient size and/or use of iterative reconstruction technique.   COMPARISON:  09/19/2021   FINDINGS: Brain: Diffuse cytotoxic edema is noted throughout the distribution of the right MCA and PCA watershed region similar to that seen on the prior exam. No parenchymal hemorrhage is seen. No new focal infarct is seen.   Vascular: No hyperdense vessel or unexpected calcification.   Skull: Large calvarial defect  there is noted on the right stable from the prior study.   Sinuses/Orbits: Paranasal sinuses are within normal limits.   Other: Gastric catheter and feeding catheter are noted extending through the nose.   IMPRESSION: Right MCA/PCA infarct stable in appearance from the previous exam. No acute hemorrhage is noted.   Right-sided hemi craniectomy stable from the prior exam. No new focal abnormality is noted.     Electronically Signed   By: Inez Catalina M.D.   On: 09/24/2021 02:47  COGNITION: Overall cognitive status: Impaired-decreased awareness of functional limitations   SENSATION: Light touch: Impaired - decreased  COORDINATION:  Left side hemiparesis  EDEMA:  None observed  MUSCLE TONE: LLE: Hypotonic   POSTURE: rounded shoulders and forward head  LOWER EXTREMITY ROM:     Active  Right Eval Left Eval  Hip flexion    Hip extension    Hip abduction    Hip adduction    Hip internal rotation    Hip external rotation    Knee flexion    Knee extension    Ankle dorsiflexion    Ankle plantarflexion    Ankle inversion    Ankle eversion     (Blank rows = not tested)  LOWER EXTREMITY MMT:    MMT Right Eval Left Eval  Hip flexion 5 2-  Hip extension 5 2-  Hip abduction 5 2-  Hip adduction 5 2-  Hip internal rotation 5 2-  Hip external rotation 5  2-  Knee flexion 5 2-  Knee extension 5 2-  Ankle dorsiflexion 5  0   Ankle plantarflexion    Ankle inversion    Ankle eversion    (Blank rows = not tested)  BED MOBILITY:  Not tested  TRANSFERS: Assistive device utilized: Hemi walker  Sit to stand: Mod A Stand to sit: Mod A Chair to chair: Mod A Floor:  Not tested   STAIRS: *Patient adamantly wanted to attempt today Level of Assistance: Max A Stair Negotiation Technique: Step to Pattern with Bilateral Rails Number of Stairs: 1  Height of Stairs: 6"  Comments: Patient wanted to attempt steps continuously requesting to try- He was able to stand  from w/c with mod assist using right arm for support on armrest of chair with gait belt. Once in standing he was able to step up with right LE with physical assist to block his left LE yet able to flex left hip/knee enough to clear 1st step- positioned back in chair.   GAIT: Gait pattern: step to pattern Distance walked: 8 feet Assistive device utilized: Hemi walker Level of assistance: Mod A Comments: Patient required VC for seqencing and use of left AFO today. - fatigues very quickly and left knee buckling with weight bearing.   FUNCTIONAL TESTS:  5 times sit to stand: Patient required mod Assist to stand and unable to complete on his own.  PATIENT SURVEYS:  FOTO 22; predicted goal= 36  TODAY'S TREATMENT:                                                                                                                              DATE: 06/26/22   Pt transfers from [facility] to standard chair in front of // bars  - mod A from PT for steadying, but takes significant increased time and has unsteadiness with task, unable to take side steps for ease of transfer.   STS from standard chair to // bars, Mod A for success,  - sidestepping in // bars with UE support x 4 feet, difficulty with LLE foot placement - pt requires rest break and pt caregiver pulls chair up behind pt - min to mod A throughout  Standing weight shift in // bars x 10 with mirror anterior for feedback, good ability to shift weight  Rest   -Standing weight shift with R LE heel raise to increase weight shift to the left. X 10  - REST  Ambulation through parallel bars to elevated mat table to work on sit to stands.  Performs 3 times sit to stand from elevated table with focus on left lower extremity weight shift.  And with hemiwalker anterior for balance.  Patient then ambulates 4 feet with hemiwalker and then wheelchair is positioned posterior to patient and patient transported to occupational therapy session  TE  Seated LAQ  and march x 10 ea LE with 5# AW on R and AAROM on the L  PATIENT  EDUCATION: Education details: PT plan of care and HEP Person educated: Patient and Spouse Education method: Explanation, Demonstration, Tactile cues, Verbal cues, and Handouts Education comprehension: verbalized understanding, returned demonstration, verbal cues required, tactile cues required, and needs further education  HOME EXERCISE PROGRAM: Access Code: [email or web address] URL: [email or web address] Date: 06/23/2022 Prepared by: Sande Brothers  Exercises - Supine Quad Set  - 1 x daily - 7 x weekly - 3 sets - 10 reps - Seated Long Arc Quad  - 1 x daily - 7 x weekly - 3 sets - 10 reps - Seated March  - 1 x daily - 7 x weekly - 3 sets - 10 reps - Supine Bridge  - 1 x daily - 7 x weekly - 3 sets - 10 reps - Sit to Stand with Counter Support  - 1 x daily - 7 x weekly - 3 sets - 10 reps  GOALS: Goals reviewed with patient? Yes  SHORT TERM GOALS: Target date: 08/04/2022  Pt will be independent with HEP in order to improve strength and balance in order to decrease fall risk and improve function at home and work.  Baseline: EVAL-Patient with no formal HEP in place Goal status: INITIAL  2.  Patient will perform all stand pivot transfers with min Assist using hemi-walker for improved functional independence with mobility in home.  Baseline: EVAL= Mod A with transfers Goal status: INITIAL    LONG TERM GOALS: Target date: 09/15/2022  Pt will improve FOTO to target score of 47  to display perceived improvements in ability to complete ADL's.  Baseline: EVAL= 36 Goal status: INITIAL  2.  Pt will achieve 5x STS in less than 60 seconds in order to demonstrate clinically significant improvement in LE strength. Baseline: EVAL- Mod A with sit to stand transfer Goal status: INITIAL INITIAL 3.  Patient will ambulate > 50 feet with hemiwalker using step to gait and CGA for improved household mobility.  Baseline: EVAL- 8  feet with hemiwalker and mod A Goal status: INITIAL  4.  Patient will perform all transfers with CGA  including w/c to various surfaces- toilet, bed, chairs using hemiwalker for improved functional independence with mobility.  Baseline:  EVAL- Mod asst with all transfers Goal status: INITIAL   ASSESSMENT:  CLINICAL IMPRESSION: Patient presents with good motivation for completion of physical therapy activities.  Patient demonstrates significant unsteadiness with all interventions particularly when ambulating with hemiwalker and fascially with transfers.  Patient will continue to benefit from therapeutic activities to improve his transfer safety as well as to improve his use of hemiwalker and hemiwalker placement. Pt will continue to benefit from skilled physical therapy intervention to address impairments, improve QOL, and attain therapy goals.      OBJECTIVE IMPAIRMENTS: Abnormal gait, cardiopulmonary status limiting activity, decreased activity tolerance, decreased balance, decreased cognition, decreased coordination, decreased endurance, decreased mobility, difficulty walking, decreased strength, decreased safety awareness, impaired perceived functional ability, impaired sensation, and impaired UE functional use.   ACTIVITY LIMITATIONS: carrying, lifting, bending, standing, squatting, stairs, transfers, and bed mobility  PARTICIPATION LIMITATIONS: meal prep, cleaning, laundry, driving, shopping, community activity, occupation, and yard work  PERSONAL FACTORS: Time since onset of injury/illness/exacerbation and 1-2 comorbidities: HTN, DM  are also affecting patient's functional outcome.   REHAB POTENTIAL: Good  CLINICAL DECISION MAKING: Evolving/moderate complexity  EVALUATION COMPLEXITY: Moderate  PLAN:  PT FREQUENCY: 1-2x/week  PT DURATION: 12 weeks  PLANNED INTERVENTIONS: Therapeutic exercises, Therapeutic activity, Neuromuscular re-education, Balance training, Gait training,  Patient/Family education, Self  Care, Joint mobilization, Stair training, Orthotic/Fit training, DME instructions, Dry Needling, Electrical stimulation, Wheelchair mobility training, Spinal mobilization, Cryotherapy, Moist heat, Splintting, Taping, and Manual therapy  PLAN FOR NEXT SESSION: Review and progress LE strengthening. Transfer training for safe mobility, Gait training with hemiwalker and close follow with w/c. Added to HEP as appropriate.    Particia Lather, PT 06/26/2022, 9:59 AM

## 2022-06-28 NOTE — Therapy (Signed)
OUTPATIENT OCCUPATIONAL THERAPY NEURO TREATMENT  Patient Name: Jacob [name] MRN: [id number] DOB:03/29/1976, 47 y.o., male Today's Date: 06/28/2022  PCP: Dr. Townsend Roger REFERRING PROVIDER: Dr. Vassie Loll Eyk  END OF SESSION:  OT End of Session - 06/28/22 1538     Visit Number 2    Number of Visits 24    Date for OT Re-Evaluation 09/10/22    Progress Note Due on Visit 10    OT Start Time 0930    OT Stop Time 1015    OT Time Calculation (min) 45 min    Equipment Utilized During Treatment wc    Activity Tolerance Patient tolerated treatment well    Behavior During Therapy [facility] for tasks assessed/performed             Past Medical History:  Diagnosis Date   Allergy    Diabetes mellitus without complication ([place])    GERD (gastroesophageal reflux disease)    Hypertension    Paralysis ([place])    Stroke [facility])    Past Surgical History:  Procedure Laterality Date   APPENDECTOMY     BUBBLE STUDY  10/02/2021   Procedure: BUBBLE STUDY;  Surgeon: Fay Records, MD;  Location: [place];  Service: Cardiovascular;;   CRANIOPLASTY Right 02/09/2022   Procedure: Craniectomy with Replacement of Bone Flap From the Abdomen;  Surgeon: Newman Pies, MD;  Location: [place];  Service: Neurosurgery;  Laterality: Right;   CRANIOTOMY Right 09/15/2021   Procedure: RIGHT DECOMPRESSIVE CRANIOTOMY, PLACEMENT OF BONE FLAP IN ABDOMEN;  Surgeon: Newman Pies, MD;  Location: [place];  Service: Neurosurgery;  Laterality: Right;   IR CT HEAD LTD  09/12/2021   IR PERCUTANEOUS ART THROMBECTOMY/INFUSION INTRACRANIAL INC DIAG ANGIO  09/12/2021   IR US GUIDE VASC ACCESS RIGHT  09/12/2021   LAPAROSCOPIC APPENDECTOMY  10/24/2013   Procedure: APPENDECTOMY LAPAROSCOPIC;  Surgeon: Shann Medal, MD;  Location: [facility];  Service: General;;   RADIOLOGY WITH ANESTHESIA N/A 09/12/2021   Procedure: IR WITH ANESTHESIA;  Surgeon: Luanne Bras, MD;  Location: [place];  Service: Radiology;  Laterality: N/A;   TEE  WITHOUT CARDIOVERSION N/A 10/02/2021   Procedure: TRANSESOPHAGEAL ECHOCARDIOGRAM (TEE);  Surgeon: Fay Records, MD;  Location: [place];  Service: Cardiovascular;  Laterality: N/A;   Patient Active Problem List   Diagnosis Date Noted   Mild episode of recurrent major depressive disorder ([place]) 06/15/2022   Type 2 diabetes mellitus with hyperglycemia, with long-term current use of insulin ([place])    Debility 02/14/2022   Status post craniectomy 02/09/2022   Memory deficit following cerebral infarction 02/07/2022   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 ([place]) 01/19/2022   Pneumonia of both lower lobes due to methicillin resistant Staphylococcus aureus (MRSA) ([place])    Cerebral embolism with cerebral infarction 09/20/2021   Hypernatremia 09/19/2021   Urinary retention 09/18/2021   Fever 09/18/2021   Acute respiratory failure with hypoxia (H[facility])    Cerebral edema ([place]) 09/15/2021   Acute ischemic stroke ([place]) 09/12/2021   Dysphagia following cerebral infarction 05/25/2021   Vitamin D deficiency 11/04/2020   Hypertensive urgency 10/31/2020   Hyperlipidemia 03/28/2018   Diabetes mellitus without complication ([place]) 02/40/9735   Essential hypertension 02/23/2018    ONSET DATE: April 2023  REFERRING DIAG: I63.9 (ICD-10-CM) - Cerebral infarction, unspecified   THERAPY DIAG:  Muscle weakness (generalized)  Other lack of coordination  Rationale for Evaluation and Treatment: Rehabilitation  SUBJECTIVE:   SUBJECTIVE STATEMENT: Spouse reports that they want to  plan OT/PT 1x per week to make things more manageable with their schedule.  Both also in agreement that 1x a week would be beneficial to lengthen therapy time as pt has visit limitations with insurance.  Pt accompanied by: self and significant other  PERTINENT HISTORY: Per chart, Jacob [name] is a 47 y.o. male with a history of prior CVA with decompressive craniectomy in April 2023.  He has a history of right  peroneal DVT on Eliquis.  He has left hemiparesis from stroke.   Per spouse, pt spent some time in [place], then 2 months at [facility] (SNF). He presented for elective cranioplasty in Sept 2023.  Pt was admitted to rehab 02/14/2022 for inpatient therapies following cranioplasty, and participated in [facility] therapies until recently.      PRECAUTIONS: Fall  WEIGHT BEARING RESTRICTIONS: No  PAIN:  Are you having pain? Yes: NPRS scale: 5/10 Pain location: L shoulder sublux Pain description: achy Aggravating factors: movement of the L shoulder  Relieving factors: lying down and gentle stretching   FALLS: Has patient fallen in last 6 months? Yes. Number of falls 1  LIVING ENVIRONMENT: Lives with: lives with their spouse and 93 y/o son, 1 cousin, and 1 friend Lives in: 2 level with master bedroom on the main level, pt resides only on 1st level Stairs: 1 step no rail to enter, pt does have small ramp Has following equipment at home: Hemi walker, Wheelchair (manual), Shower bench, bed side commode, Grab bars, Ramped entry, and platform walker (grab bar by the toilet but not in the shower.  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  PATIENT GOALS: Pt wants to get back to walking and being as indep as possible.   OBJECTIVE:   HAND DOMINANCE: Right  ADLs: Overall ADLs: HHA Mon-Fri 5 hours/day; spouse is pt's primary caregiver Transfers/ambulation related to ADLs: min A sit to stand pivot from wc using gait belt  Eating: assist to cut food  Grooming: min A to manage facial hair UB Dressing: max A LB Dressing: max-dep A Toileting: dep, wears adult briefs.  Spouse reports pt has just started transitioning to use of BSC over the toilet vs voiding in a brief Bathing: max A with sponge/bed bath (in the process of getting bathroom re-done to enable pt to take a shower) Tub Shower transfers: N/A at this time; pt states he  doesn't trust himself with the glass doors. Equipment:  see above  IADLs: Shopping: dep Light housekeeping: dep Meal Prep: dep Community mobility: wc dep  Medication management: dep Financial management: dep Handwriting:  Pt is R hand dominant, NT  MOBILITY STATUS:  Pt reports he can amb with hemi-walker 10-20 ft with max A (per spouse) and someone following with a wc behind him  POSTURE COMMENTS:  rounded shoulders, forward head, and L sided hemiplegia (L shoulder subluxation ~1 thumb breadth) Sitting balance:  able to sit edge of mat and tolerate moderate perturbation from all directions.  Pt can reach forward to ankles and return to midline without LOB.  FUNCTIONAL OUTCOME MEASURES: FOTO: 20; predicted 29  UPPER EXTREMITY ROM:    Passive ROM Right Eval (A/PROMWNL) Left eval  Shoulder flexion  78  Shoulder abduction  60  Shoulder adduction    Shoulder extension    Shoulder internal rotation    Shoulder external rotation  -10 (elbow at side)  Elbow flexion    Elbow extension  Wrist flexion  70  Wrist extension  48  Wrist ulnar deviation    Wrist radial deviation    Wrist pronation  90  Wrist supination  60  (Blank rows = not tested)  L hand passively opens ~75% of full extension d/t PIP and MP tightness in all digits.  UPPER EXTREMITY MMT:     MMT Right eval Left eval  Shoulder flexion 5 0  Shoulder abduction 5 0  Shoulder adduction    Shoulder extension    Shoulder internal rotation    Shoulder external rotation    Middle trapezius    Lower trapezius    Elbow flexion 5 0  Elbow extension 5 0  Wrist flexion 5 0  Wrist extension 5 0  Wrist ulnar deviation    Wrist radial deviation    Wrist pronation    Wrist supination    (Blank rows = not tested)  HAND FUNCTION: Grip strength: Right: 95 lbs; Left: NT lbs, Lateral pinch: Right: 25 lbs, Left: NT lbs, and 3 point pinch: Right: 20 lbs, Left: NT lbs  COORDINATION: 9 Hole Peg test: Right: NT sec;  Left: unable sec  SENSATION: LUE: Light touch: Impaired  Proprioception: Impaired   EDEMA: None  MUSCLE TONE: LUE: Severe and Hypertonic sublux 1 thumb breadth  COGNITION: Overall cognitive status:  Decreased awareness of deficits  VISION: Subjective report: Pt reports decreased peripheral vision on the L Baseline vision: No visual deficits Visual history: N/A  VISION ASSESSMENT: Tracking/Visual pursuits: Decreased smoothness of eye movement to Left superior field, Decreased smoothness of eye movement to Left inferior field, and Requires cues, head turns, or add eye shifts to track Saccades: additional eye shifts occurred during testing and additional head turns occurred during testing Visual Fields: Left homonymous hemianopsia  PERCEPTION: Not tested, will assess in functional contexts with additional visits  PRAXIS: Impaired: Motor planning  OBSERVATIONS: L hand rests in flexion.  Noted slight odor originating from palm/between fingers.  Skin on volar hand appears to have some yeast.  Encouraged pt/spouse bring hand splint to all sessions to work towards opening up hand for improving skin integrity.  Spouse will bring next week.  TODAY'S TREATMENT:                                                                                                                              Self Care: Initiated education on upright positioning in wc.  Placed rolled towel at lumbar spine and recommended same at home while in chair or sitting on couch.  Provided education positioning of LUE while in wc with recommendation for arm trough to support L shoulder sublux.  Spouse reports they have been waiting on wc lap tray and she will call again about order status.  Performed hand hygiene with recommendation to complete several times daily.  Reinforced need to use washcloth with soap between fingers and all parts of dorsal and volar palm, wash then dry thoroughly.  Ok to use lotion but recommended towel  drying after lotion application to ensure hand is not left moist.  Encouraged spouse add this to their HHA's daily list.  Spouse receptive to all education.  Issued palm protector and placed red foam dowel between palm and protector to further widen space between fingertips and palm.  Pt tolerated for 30 min without issue during session.  OT encouraged wearing for an hour at a time.  Ok to wear at night if dowel is removed from palm protector.  Will work up to dowel in place for night time.  Spouse verbalized understanding.  Spouse brought in resting hand splint but needs to be washed and will start pt on palm protector and work up to resting hand splint.  Digits too flexed at this time for resting hand splint.  Spouse reports she will wash fabric of resting hand splint.  Encouraged pt have splint each therapy session to assess readiness for wear.  Spouse agreed.  Therapeutic Exercise: Performed passive L scapular protraction/retraction/depression with manual scapular gliding.  Performed prolonged stretch for digit ext, isolating 1 digit at a time to maximize stretch at the MP, PIP, and DIP joints of all digits.  Performed passive wrist flex/ext, forearm pron/sup, elbow flex/ext, and L shoulder flex/abd 0-80, IR/ER (ER to -20 with elbow at side), working to reduce pain, tone, muscle stiffness, and reduce contracture risk throughout the LUE.  Fair tolerance, frequent rest breaks needed.    PATIENT EDUCATION: Education details: hand hygiene to improve skin integrity of L hand, wearing schedule for palm protector Person educated: Patient and Spouse Education method: Explanation and Verbal cues Education comprehension: verbalized understanding and needs further education  HOME EXERCISE PROGRAM: Will initiate within the next 1-2 sessions   GOALS: Goals reviewed with patient? Yes  SHORT TERM GOALS: Target date: 07/30/22 (6 weeks)  Pt/caregivers will demo indep with HEP for reducing pain and stiffness  throughout the LUE. Baseline: Not yet initiated Goal status: INITIAL  2.  Pt/caregivers will demo supportive positioning techniques with min vc to support L hemiparetic limb. Baseline: Educ not yet initiated Goal status: INITIAL  3.  Pt/caregivers will follow L hand splinting schedule with at last 50% compliance in order to reduce contracture risk and to optimize skin integrity of L hand. Baseline: Not yet initiated.   Goal status: INITIAL   LONG TERM GOALS: Target date: 09/10/22 (12 weeks)  Pt will increase FOTO score to 29 or more to indicate increased indep with daily tasks.   Baseline: Eval: 20 (predicted 29) Goal status: INITIAL  2.  Pt will transfer wc<>BSC with min guard Baseline: min-mod A Goal status: INITIAL  3.  Pt will don/doff shirt with min A Baseline: Mod-max A Goal status: INITIAL  4.  Pt will manage clothing for toileting with min A Baseline: max A-dep Goal status: INITIAL  5.  Pt will increase L shoulder strength by 1 MM grade to assist with repositioning L upper arm away from side for underarm care and donning shirt. Baseline: L shoulder 0/5 Goal status: INITIAL  6.  Through manual therapy, neuro re-ed, and therapeutic exercises, pt will report 3/10 pain or less throughout the LUE to increase tolerance for engaging the LUE into ADLs.   Baseline: LUE 5/10 pain Goal status: INITIAL  ASSESSMENT:  CLINICAL IMPRESSION: Initiated LUE positioning and skin care for improving L hand skin integrity.  Issued palm protector with good tolerance while wearing for 30 min during OT session this date.  Not yet ready for resting hand splint d/t extent of flexion in L hand digits, but spouse was asked to wash splint and bring to each visit so that pt can work up to wearing this, and will continue to assess readiness.  Pt tolerated LUE passive stretching fair this date, but required frequent rest breaks.  Spouse reported that he usually doesn't let anyone "mess" with his L  hand.  OT educated on importance of daily stretching for contracture prevention, and that pain should improve as stiffness decreases throughout stretch and use of optimal positioning throughout the LUE.  Spouse reports she was not thoroughly educated on PROM for the LUE.  Will plan to instruct spouse on ROM next session that she is present.  Pt will continue to benefit from skilled OT to work towards optimal positioning of the LUE for contracture prevention, maximize ROM throughout the LUE for ADLs, decrease pain, maximize engagement of the LUE into daily tasks, as well work to maximize level of indep with daily tasks.   PERFORMANCE DEFICITS: in functional skills including ADLs, IADLs, coordination, dexterity, proprioception, sensation, tone, ROM, strength, pain, flexibility, Fine motor control, Gross motor control, mobility, balance, body mechanics, decreased knowledge of use of DME, skin integrity, vision, and UE functional use, cognitive skills including perception and safety awareness, and psychosocial skills including coping strategies and routines and behaviors.   IMPAIRMENTS: are limiting patient from ADLs, IADLs, leisure, and social participation.   CO-MORBIDITIES: may have co-morbidities  that affects occupational performance. Patient will benefit from skilled OT to address above impairments and improve overall function.  MODIFICATION OR ASSISTANCE TO COMPLETE EVALUATION: No modification of tasks or assist necessary to complete an evaluation.  OT OCCUPATIONAL PROFILE AND HISTORY: Problem focused assessment: Including review of records relating to presenting problem.  CLINICAL DECISION MAKING: Moderate - several treatment options, min-mod task modification necessary  REHAB POTENTIAL: Good  EVALUATION COMPLEXITY: High    PLAN:  OT FREQUENCY: 2x/week  OT DURATION: 12 weeks  PLANNED INTERVENTIONS: self care/ADL training, therapeutic exercise, therapeutic activity, neuromuscular  re-education, manual therapy, passive range of motion, balance training, functional mobility training, splinting, electrical stimulation, moist heat, cryotherapy, patient/family education, cognitive remediation/compensation, visual/perceptual remediation/compensation, psychosocial skills training, coping strategies training, and DME and/or AE instructions  RECOMMENDED OTHER SERVICES: N/A  CONSULTED AND AGREED WITH PLAN OF CARE: Patient and family member/caregiver  PLAN FOR NEXT SESSION: see plan  Danelle Earthly, MS, OTR/L  [place] Dials, OT 06/28/2022, 3:39 PM

## 2022-06-30 ENCOUNTER — Ambulatory Visit: Payer: Medicaid Other | Admitting: Physical Therapy

## 2022-06-30 ENCOUNTER — Encounter: Payer: Self-pay | Admitting: Physical Therapy

## 2022-06-30 ENCOUNTER — Ambulatory Visit: Payer: Medicaid Other

## 2022-06-30 DIAGNOSIS — R269 Unspecified abnormalities of gait and mobility: Secondary | ICD-10-CM | POA: Diagnosis not present

## 2022-06-30 DIAGNOSIS — M6281 Muscle weakness (generalized): Secondary | ICD-10-CM

## 2022-06-30 DIAGNOSIS — R278 Other lack of coordination: Secondary | ICD-10-CM

## 2022-06-30 DIAGNOSIS — R262 Difficulty in walking, not elsewhere classified: Secondary | ICD-10-CM

## 2022-06-30 DIAGNOSIS — R2681 Unsteadiness on feet: Secondary | ICD-10-CM

## 2022-06-30 NOTE — Therapy (Signed)
OUTPATIENT PHYSICAL THERAPY NEURO TREATMENT   Patient Name: Jacob [name] MRN: [id number] DOB:31-May-1975, 47 y.o., male Today's Date: 06/30/2022   PCP: Dr. Townsend Roger REFERRING PROVIDER: Dr. Vassie Loll Eyk  END OF SESSION:  PT End of Session - 06/30/22 0936     Visit Number 3    Number of Visits 24    Date for PT Re-Evaluation 09/15/22    Authorization Type [place] [employer]    Progress Note Due on Visit 10    PT Start Time 0931    PT Stop Time 1014    PT Time Calculation (min) 43 min    Equipment Utilized During Treatment Gait belt    Activity Tolerance Patient tolerated treatment well    Behavior During Therapy WFL for tasks assessed/performed              Past Medical History:  Diagnosis Date   Allergy    Diabetes mellitus without complication ([place])    GERD (gastroesophageal reflux disease)    Hypertension    Paralysis ([place])    Stroke [facility])    Past Surgical History:  Procedure Laterality Date   APPENDECTOMY     BUBBLE STUDY  10/02/2021   Procedure: BUBBLE STUDY;  Surgeon: Fay Records, MD;  Location: [place];  Service: Cardiovascular;;   CRANIOPLASTY Right 02/09/2022   Procedure: Craniectomy with Replacement of Bone Flap From the Abdomen;  Surgeon: Newman Pies, MD;  Location: [place];  Service: Neurosurgery;  Laterality: Right;   CRANIOTOMY Right 09/15/2021   Procedure: RIGHT DECOMPRESSIVE CRANIOTOMY, PLACEMENT OF BONE FLAP IN ABDOMEN;  Surgeon: Newman Pies, MD;  Location: [place];  Service: Neurosurgery;  Laterality: Right;   IR CT HEAD LTD  09/12/2021   IR PERCUTANEOUS ART THROMBECTOMY/INFUSION INTRACRANIAL INC DIAG ANGIO  09/12/2021   IR US GUIDE VASC ACCESS RIGHT  09/12/2021   LAPAROSCOPIC APPENDECTOMY  10/24/2013   Procedure: APPENDECTOMY LAPAROSCOPIC;  Surgeon: Shann Medal, MD;  Location: [facility];  Service: General;;   RADIOLOGY WITH ANESTHESIA N/A 09/12/2021   Procedure: IR WITH ANESTHESIA;  Surgeon: Luanne Bras, MD;  Location: [place];   Service: Radiology;  Laterality: N/A;   TEE WITHOUT CARDIOVERSION N/A 10/02/2021   Procedure: TRANSESOPHAGEAL ECHOCARDIOGRAM (TEE);  Surgeon: Fay Records, MD;  Location: [place];  Service: Cardiovascular;  Laterality: N/A;   Patient Active Problem List   Diagnosis Date Noted   Mild episode of recurrent major depressive disorder ([place]) 06/15/2022   Type 2 diabetes mellitus with hyperglycemia, with long-term current use of insulin ([place])    Debility 02/14/2022   Status post craniectomy 02/09/2022   Memory deficit following cerebral infarction 02/07/2022   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 ([place]) 01/19/2022   Pneumonia of both lower lobes due to methicillin resistant Staphylococcus aureus (MRSA) ([place])    Cerebral embolism with cerebral infarction 09/20/2021   Hypernatremia 09/19/2021   Urinary retention 09/18/2021   Fever 09/18/2021   Acute respiratory failure with hypoxia ([place])    Cerebral edema ([place]) 09/15/2021   Acute ischemic stroke ([place]) 09/12/2021   Dysphagia following cerebral infarction 05/25/2021   Vitamin D deficiency 11/04/2020   Hypertensive urgency 10/31/2020   Hyperlipidemia 03/28/2018   Diabetes mellitus without complication ([place]) 62/22/9798   Essential hypertension 02/23/2018    ONSET DATE: 08/2021  REFERRING DIAG:  Diagnosis  I63.9 (ICD-10-CM) - Cerebral infarction, unspecified  R53.81 (ICD-10-CM) - Other malaise    THERAPY DIAG:  Muscle weakness (generalized)  Abnormality of gait  and mobility  Difficulty in walking, not elsewhere classified  Unsteadiness on feet  Rationale for Evaluation and Treatment: Rehabilitation  SUBJECTIVE:                                                                                                                                                                                             SUBJECTIVE STATEMENT: Patient reports no significant changes since last session.  Patient reports he had a fall  to the floor when his friend was assisting with getting him up.  Patient reports no injury and with assistance was able to get off the floor.  Patient reports he would like to work on 4 to stand transfers was also educated regarding safety with this. Pt accompanied by: significant other- Wife  PERTINENT HISTORY: Patient is a 47 year old male with recent history of CVA  in April 2023 and then elective cranioplasty in September 2023 with subsequent inpatient rehab and then home health services. Patient has left hemiparesis.   PAIN:  Are you having pain? No  PRECAUTIONS: Fall  WEIGHT BEARING RESTRICTIONS: No  FALLS: Has patient fallen in last 6 months? Yes. 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   RADIATION DOSE REDUCTION: This exam was performed according to the departmental dose-optimization program which includes automated exposure control, adjustment of the mA and/or kV according to patient size and/or use of iterative reconstruction technique.   COMPARISON:  09/19/2021   FINDINGS: Brain: Diffuse cytotoxic edema is noted throughout the distribution of  the right MCA and PCA watershed region similar to that seen on the prior exam. No parenchymal hemorrhage is seen. No new focal infarct is seen.   Vascular: No hyperdense vessel or unexpected calcification.   Skull: Large calvarial defect there is noted on the right stable from the prior study.  Sinuses/Orbits: Paranasal sinuses are within normal limits.   Other: Gastric catheter and feeding catheter are noted extending through the nose.   IMPRESSION: Right MCA/PCA infarct stable in appearance from the previous exam. No acute hemorrhage is noted.   Right-sided hemi craniectomy stable from the prior exam. No new focal abnormality is noted.     Electronically Signed   By: Inez Catalina M.D.   On: 09/24/2021 02:47  COGNITION: Overall cognitive status: Impaired-decreased awareness of functional limitations   SENSATION: Light touch: Impaired - decreased  COORDINATION:  Left side hemiparesis  EDEMA:  None observed  MUSCLE TONE: LLE: Hypotonic   POSTURE: rounded shoulders and forward head  LOWER EXTREMITY ROM:     Active  Right Eval Left Eval  Hip flexion    Hip extension    Hip abduction    Hip adduction    Hip internal rotation    Hip external rotation    Knee flexion    Knee extension    Ankle dorsiflexion    Ankle plantarflexion    Ankle inversion    Ankle eversion     (Blank rows = not tested)  LOWER EXTREMITY MMT:    MMT Right Eval Left Eval  Hip flexion 5 2-  Hip extension 5 2-  Hip abduction 5 2-  Hip adduction 5 2-  Hip internal rotation 5 2-  Hip external rotation 5 2-  Knee flexion 5 2-  Knee extension 5 2-  Ankle dorsiflexion 5  0   Ankle plantarflexion    Ankle inversion    Ankle eversion    (Blank rows = not tested)  BED MOBILITY:  Not tested  TRANSFERS: Assistive device utilized: Hemi walker  Sit to stand: Mod A Stand to sit: Mod A Chair to chair: Mod A Floor:  Not tested   STAIRS: *Patient adamantly wanted to  attempt today Level of Assistance: Max A Stair Negotiation Technique: Step to Pattern with Bilateral Rails Number of Stairs: 1  Height of Stairs: 6"  Comments: Patient wanted to attempt steps continuously requesting to try- He was able to stand from w/c with mod assist using right arm for support on armrest of chair with gait belt. Once in standing he was able to step up with right LE with physical assist to block his left LE yet able to flex left hip/knee enough to clear 1st step- positioned back in chair.   GAIT: Gait pattern: step to pattern Distance walked: 8 feet Assistive device utilized: Hemi walker Level of assistance: Mod A Comments: Patient required VC for seqencing and use of left AFO today. - fatigues very quickly and left knee buckling with weight bearing.   FUNCTIONAL TESTS:  5 times sit to stand: Patient required mod Assist to stand and unable to complete on his own.  PATIENT SURVEYS:  FOTO 29; predicted goal= 47  TODAY'S TREATMENT:  DATE: 06/30/22  Patient requires min to mod assist with all transfers for safety and contact-guard assistance standing at all times for safety.  Note: Portions of this document were prepared using Dragon voice recognition software and although reviewed may contain unintentional dictation errors in syntax, grammar, or spelling.   TE  Seated LAQ and march 2*10 AAROM on the L  TA  Patient transferred with min to mod assist from wheelchair to mat table.  Mat table raised height in order to improve independence with sit to stand transfer.  Patient initially performs 5 sit to stand transfers with mirror anterior working on standing and shifting weight to the left as well as standing with feet shoulder width apart and positioning left lower extremity.  Patient does try to bear weight solely on right lower extremity and  hemiwalker without cues  Following brief rest patient performs 10 ankle pumps in seated position in order to ensure proper performance when completing and standing.  Then transition to standing and completed 10 right lower extremity heel raises in order to improve weight shifting to the left side.  Completed 3 rounds of this patient demonstrates significant fatigue when bearing weight through his left lower extremity.  Following another brief rest patient completes sidestepping to the left and to the right with mat table posterior.  Patient has difficulty with left lower extremity hip abduction and tends to flex hip instead of abducting hip when attempting to sidestep.  Physical therapist provides mod to max assist for left lower extremity placement -Following brief rest physical therapist required half foam roller for external cue for patient to target left lower extremity hip abduction.  Patient performs 10 times standing hip abduction in order to improve hip abduction functional strength for sidestepping to improve transfers as well as improve strength.  Patient transfers via stand and ambulation x 5 feet involving left side stepping back to wheelchair.  Patient does require min to mod assist at times to prevent excessive weightbearing to his right side resulting in loss of balance.  Patient safely transferred back to chair ended session by assisting patient with getting his jacket and hat on properly before going back outside.  PATIENT EDUCATION: Education details: PT plan of care and HEP Person educated: Patient and Spouse Education method: Explanation, Demonstration, Tactile cues, Verbal cues, and Handouts Education comprehension: verbalized understanding, returned demonstration, verbal cues required, tactile cues required, and needs further education  HOME EXERCISE PROGRAM: Access Code: [email or web address] URL: [email or web address] Date: 06/23/2022 Prepared by: Maureen Ralphs  Exercises - Supine Quad Set  - 1 x daily - 7 x weekly - 3 sets - 10 reps - Seated Long Arc Quad  - 1 x daily - 7 x weekly - 3 sets - 10 reps - Seated March  - 1 x daily - 7 x weekly - 3 sets - 10 reps - Supine Bridge  - 1 x daily - 7 x weekly - 3 sets - 10 reps - Sit to Stand with Counter Support  - 1 x daily - 7 x weekly - 3 sets - 10 reps  GOALS: Goals reviewed with patient? Yes  SHORT TERM GOALS: Target date: 08/04/2022  Pt will be independent with HEP in order to improve strength and balance in order to decrease fall risk and improve function at home and work.  Baseline: EVAL-Patient with no formal HEP in place Goal status: INITIAL  2.  Patient will perform all stand pivot transfers with min Assist using hemi-walker for improved  functional independence with mobility in home.  Baseline: EVAL= Mod A with transfers Goal status: INITIAL    LONG TERM GOALS: Target date: 09/15/2022  Pt will improve FOTO to target score of 47  to display perceived improvements in ability to complete ADL's.  Baseline: EVAL= 36 Goal status: INITIAL  2.  Pt will achieve 5x STS in less than 60 seconds in order to demonstrate clinically significant improvement in LE strength. Baseline: EVAL- Mod A with sit to stand transfer Goal status: INITIAL INITIAL 3.  Patient will ambulate > 50 feet with hemiwalker using step to gait and CGA for improved household mobility.  Baseline: EVAL- 8 feet with hemiwalker and mod A Goal status: INITIAL  4.  Patient will perform all transfers with CGA  including w/c to various surfaces- toilet, bed, chairs using hemiwalker for improved functional independence with mobility.  Baseline:  EVAL- Mod asst with all transfers Goal status: INITIAL   ASSESSMENT:  CLINICAL IMPRESSION: Patient presents with good motivation for completion of physical therapy activities.  P physical therapy interventions focused on sit to stand transfers as well as improving left lower  extremity weightbearing and balance activities.  Patient demonstrates good ability to complete these and progress to performing sit to stand transfers from elevated plinth table without upper extremity assist despite his lack of confidence and ability to do so prior to instruction.  Patient does have significant difficulty bearing weight through his left lower extremity and is significantly imbalance when performing ambulation activities.  Patient request he would like to work on floor to stand transfers and was instructed that we would need specialized equipment or him increased help in order to safely get to this position and out of this position if it was too difficult for him on a given day.  Patient reluctantly verbalizes understanding.  Pt will continue to benefit from skilled physical therapy intervention to address impairments, improve QOL, and attain therapy goals.      OBJECTIVE IMPAIRMENTS: Abnormal gait, cardiopulmonary status limiting activity, decreased activity tolerance, decreased balance, decreased cognition, decreased coordination, decreased endurance, decreased mobility, difficulty walking, decreased strength, decreased safety awareness, impaired perceived functional ability, impaired sensation, and impaired UE functional use.   ACTIVITY LIMITATIONS: carrying, lifting, bending, standing, squatting, stairs, transfers, and bed mobility  PARTICIPATION LIMITATIONS: meal prep, cleaning, laundry, driving, shopping, community activity, occupation, and yard work  PERSONAL FACTORS: Time since onset of injury/illness/exacerbation and 1-2 comorbidities: HTN, DM  are also affecting patient's functional outcome.   REHAB POTENTIAL: Good  CLINICAL DECISION MAKING: Evolving/moderate complexity  EVALUATION COMPLEXITY: Moderate  PLAN:  PT FREQUENCY: 1-2x/week  PT DURATION: 12 weeks  PLANNED INTERVENTIONS: Therapeutic exercises, Therapeutic activity, Neuromuscular re-education, Balance  training, Gait training, Patient/Family education, Self Care, Joint mobilization, Stair training, Orthotic/Fit training, DME instructions, Dry Needling, Electrical stimulation, Wheelchair mobility training, Spinal mobilization, Cryotherapy, Moist heat, Splintting, Taping, and Manual therapy  PLAN FOR NEXT SESSION: Review and progress LE strengthening. Transfer training for safe mobility, Gait training with hemiwalker and close follow with w/c. Added to HEP as appropriate.    Particia Lather, PT 06/30/2022, 4:03 PM

## 2022-06-30 NOTE — Therapy (Signed)
OUTPATIENT OCCUPATIONAL THERAPY NEURO TREATMENT  Patient Name: Jacob [name] MRN: [id number] DOB:March 03, 1976, 47 y.o., male Today's Date: 06/30/2022  PCP: Dr. Townsend Roger REFERRING PROVIDER: Dr. Vassie Loll Eyk  END OF SESSION:  OT End of Session - 06/30/22 1009     Visit Number 3    Number of Visits 24    Date for OT Re-Evaluation 09/10/22    Progress Note Due on Visit 10    OT Start Time 0830    OT Stop Time 0915    OT Time Calculation (min) 45 min    Equipment Utilized During Treatment wc    Activity Tolerance Patient tolerated treatment well    Behavior During Therapy [facility] for tasks assessed/performed             Past Medical History:  Diagnosis Date   Allergy    Diabetes mellitus without complication ([place])    GERD (gastroesophageal reflux disease)    Hypertension    Paralysis ([place])    Stroke [facility])    Past Surgical History:  Procedure Laterality Date   APPENDECTOMY     BUBBLE STUDY  10/02/2021   Procedure: BUBBLE STUDY;  Surgeon: Fay Records, MD;  Location: [place];  Service: Cardiovascular;;   CRANIOPLASTY Right 02/09/2022   Procedure: Craniectomy with Replacement of Bone Flap From the Abdomen;  Surgeon: Newman Pies, MD;  Location: [place];  Service: Neurosurgery;  Laterality: Right;   CRANIOTOMY Right 09/15/2021   Procedure: RIGHT DECOMPRESSIVE CRANIOTOMY, PLACEMENT OF BONE FLAP IN ABDOMEN;  Surgeon: Newman Pies, MD;  Location: [place];  Service: Neurosurgery;  Laterality: Right;   IR CT HEAD LTD  09/12/2021   IR PERCUTANEOUS ART THROMBECTOMY/INFUSION INTRACRANIAL INC DIAG ANGIO  09/12/2021   IR US GUIDE VASC ACCESS RIGHT  09/12/2021   LAPAROSCOPIC APPENDECTOMY  10/24/2013   Procedure: APPENDECTOMY LAPAROSCOPIC;  Surgeon: Shann Medal, MD;  Location: [facility];  Service: General;;   RADIOLOGY WITH ANESTHESIA N/A 09/12/2021   Procedure: IR WITH ANESTHESIA;  Surgeon: Luanne Bras, MD;  Location: [place];  Service: Radiology;  Laterality: N/A;   TEE  WITHOUT CARDIOVERSION N/A 10/02/2021   Procedure: TRANSESOPHAGEAL ECHOCARDIOGRAM (TEE);  Surgeon: Fay Records, MD;  [place];  Service: Cardiovascular;  Laterality: N/A;   Patient Active Problem List   Diagnosis Date Noted   Mild episode of recurrent major depressive disorder ([place]) 06/15/2022   Type 2 diabetes mellitus with hyperglycemia, with long-term current use of insulin ([place])    Debility 02/14/2022   Status post craniectomy 02/09/2022   Memory deficit following cerebral infarction 02/07/2022   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 ([place]) 01/19/2022   Pneumonia of both lower lobes due to methicillin resistant Staphylococcus aureus (MRSA) ([place])    Cerebral embolism with cerebral infarction 09/20/2021   Hypernatremia 09/19/2021   Urinary retention 09/18/2021   Fever 09/18/2021   Acute respiratory failure with hypoxia ([facility])    Cerebral edema ([place]) 09/15/2021   Acute ischemic stroke ([place]) 09/12/2021   Dysphagia following cerebral infarction 05/25/2021   Vitamin D deficiency 11/04/2020   Hypertensive urgency 10/31/2020   Hyperlipidemia 03/28/2018   Diabetes mellitus without complication ([place]) 83/66/2947   Essential hypertension 02/23/2018    ONSET DATE: April 2023  REFERRING DIAG: I63.9 (ICD-10-CM) - Cerebral infarction, unspecified   THERAPY DIAG:  Muscle weakness (generalized)  Other lack of coordination  Rationale for Evaluation and Treatment: Rehabilitation  SUBJECTIVE:   SUBJECTIVE STATEMENT: Pt reports doing well today.  Rode transportation here this morning as spouse was working. Pt accompanied by: self and significant other  PERTINENT HISTORY: Per chart, [name] is a 47 y.o. male with a history of prior CVA with decompressive craniectomy in April 2023.  He has a history of right peroneal DVT on Eliquis.  He has left hemiparesis from stroke.   Per spouse, pt spent some time in [place], then 2 months at [employer] (SNF). He presented for elective cranioplasty in Sept 2023.  Pt was admitted to rehab 02/14/2022 for inpatient therapies following cranioplasty, and participated in [facility] therapies until recently.      PRECAUTIONS: Fall  WEIGHT BEARING RESTRICTIONS: No  PAIN:  Are you having pain? Yes: NPRS scale: 2 (rest), (activity) 5/10 Pain location: L shoulder sublux Pain description: achy Aggravating factors: movement of the L shoulder  Relieving factors: lying down and gentle stretching   FALLS: Has patient fallen in last 6 months? Yes. Number of falls 1  LIVING ENVIRONMENT: Lives with: lives with their spouse and 82 y/o son, 1 cousin, and 1 friend Lives in: 2 level with master bedroom on the main level, pt resides only on 1st level Stairs: 1 step no rail to enter, pt does have small ramp Has following equipment at home: Hemi walker, Wheelchair (manual), Shower bench, bed side commode, Grab bars, Ramped entry, and platform walker (grab bar by the toilet but not in the shower.  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  PATIENT GOALS: Pt wants to get back to walking and being as indep as possible.   OBJECTIVE:   HAND DOMINANCE: Right  ADLs: Overall ADLs: HHA Mon-Fri 5 hours/day; spouse is pt's primary caregiver Transfers/ambulation related to ADLs: min A sit to stand pivot from wc using gait belt  Eating: assist to cut food  Grooming: min A to manage facial hair UB Dressing: max A LB Dressing: max-dep A Toileting: dep, wears adult briefs.  Spouse reports pt has just started transitioning to use of BSC over the toilet vs voiding in a brief Bathing: max A with sponge/bed bath (in the process of getting bathroom re-done to enable pt to take a shower) Tub Shower transfers: N/A at this time; pt states he doesn't trust himself with the glass doors. Equipment:  see above  IADLs: Shopping: dep Light housekeeping: dep Meal  Prep: dep Community mobility: wc dep  Medication management: dep Financial management: dep Handwriting:  Pt is R hand dominant, NT  MOBILITY STATUS:  Pt reports he can amb with hemi-walker 10-20 ft with max A (per spouse) and someone following with a wc behind him  POSTURE COMMENTS:  rounded shoulders, forward head, and L sided hemiplegia (L shoulder subluxation ~1 thumb breadth) Sitting balance:  able to sit edge of mat and tolerate moderate perturbation from all directions.  Pt can reach forward to ankles and return to midline without LOB.  FUNCTIONAL OUTCOME MEASURES: FOTO: 20; predicted 29  UPPER EXTREMITY ROM:    Passive ROM Right Eval (A/PROMWNL) Left eval  Shoulder flexion  78  Shoulder abduction  60  Shoulder adduction    Shoulder extension    Shoulder internal rotation    Shoulder external rotation  -10 (elbow at side)  Elbow flexion    Elbow extension    Wrist flexion  70  Wrist extension  48  Wrist ulnar deviation    Wrist radial deviation    Wrist  pronation  90  Wrist supination  60  (Blank rows = not tested)  L hand passively opens ~75% of full extension d/t PIP and MP tightness in all digits.  UPPER EXTREMITY MMT:     MMT Right eval Left eval  Shoulder flexion 5 0  Shoulder abduction 5 0  Shoulder adduction    Shoulder extension    Shoulder internal rotation    Shoulder external rotation    Middle trapezius    Lower trapezius    Elbow flexion 5 0  Elbow extension 5 0  Wrist flexion 5 0  Wrist extension 5 0  Wrist ulnar deviation    Wrist radial deviation    Wrist pronation    Wrist supination    (Blank rows = not tested)  HAND FUNCTION: Grip strength: Right: 95 lbs; Left: NT lbs, Lateral pinch: Right: 25 lbs, Left: NT lbs, and 3 point pinch: Right: 20 lbs, Left: NT lbs  COORDINATION: 9 Hole Peg test: Right: NT sec; Left: unable sec  SENSATION: LUE: Light touch: Impaired  Proprioception: Impaired   EDEMA: None  MUSCLE TONE: LUE:  Severe and Hypertonic sublux 1 thumb breadth  COGNITION: Overall cognitive status:  Decreased awareness of deficits  VISION: Subjective report: Pt reports decreased peripheral vision on the L Baseline vision: No visual deficits Visual history: N/A  VISION ASSESSMENT: Tracking/Visual pursuits: Decreased smoothness of eye movement to Left superior field, Decreased smoothness of eye movement to Left inferior field, and Requires cues, head turns, or add eye shifts to track Saccades: additional eye shifts occurred during testing and additional head turns occurred during testing Visual Fields: Left homonymous hemianopsia  PERCEPTION: Not tested, will assess in functional contexts with additional visits  PRAXIS: Impaired: Motor planning  OBSERVATIONS: L hand rests in flexion.  Noted slight odor originating from palm/between fingers.  Skin on volar hand appears to have some yeast.  Encouraged pt/spouse bring hand splint to all sessions to work towards opening up hand for improving skin integrity.  Spouse will bring next week.  TODAY'S TREATMENT:       Moist heat placed on L shoulder during supine stretches for pain management/muscle relaxation.                                                                                                                         Therapeutic Exercise: Transferred pt SPT wc<>mat min A and assisted mod-max A to transition sit to supine.  Positioned pillow beneath head and LUE to support L shoulder sublux.  In supine, performed passive L scapular retraction and depression.  Performed prolonged stretch for digit ext, isolating 1 digit at a time to maximize stretch at the MP, PIP, and DIP joints of all digits.  Performed passive wrist flex/ext, forearm pron/sup, elbow flex/ext, and L shoulder flex/abd 0-70, IR/ER (ER to neutral with elbow at side), working to reduce pain, tone, muscle stiffness, and reduce contracture risk throughout the LUE.  Fair tolerance, frequent  rest breaks  needed.    Orthotic Fit: Adjusted resting hand splint and fit to L hand.  Pt tolerated splint for last 1/4 of OT session and all of PT session (~ 1 hour total).  Pt doffed splint only to be able to don his coat.  Spouse not present today but OT will communicate recommendation for donning splint for another hour later today, working up to 2 hours on and 2 hours off.  PATIENT EDUCATION: Education details: pillow beneath LUE in supine to support L shoulder sublux Person educated: Patient and Spouse Education method: Explanation and Verbal cues Education comprehension: verbalized understanding and needs further education  HOME EXERCISE PROGRAM: Will initiate within the next 1-2 sessions   GOALS: Goals reviewed with patient? Yes  SHORT TERM GOALS: Target date: 07/30/22 (6 weeks)  Pt/caregivers will demo indep with HEP for reducing pain and stiffness throughout the LUE. Baseline: Not yet initiated Goal status: INITIAL  2.  Pt/caregivers will demo supportive positioning techniques with min vc to support L hemiparetic limb. Baseline: Educ not yet initiated Goal status: INITIAL  3.  Pt/caregivers will follow L hand splinting schedule with at last 50% compliance in order to reduce contracture risk and to optimize skin integrity of L hand. Baseline: Not yet initiated.   Goal status: INITIAL   LONG TERM GOALS: Target date: 09/10/22 (12 weeks)  Pt will increase FOTO score to 29 or more to indicate increased indep with daily tasks.   Baseline: Eval: 20 (predicted 29) Goal status: INITIAL  2.  Pt will transfer wc<>BSC with min guard Baseline: min-mod A Goal status: INITIAL  3.  Pt will don/doff shirt with min A Baseline: Mod-max A Goal status: INITIAL  4.  Pt will manage clothing for toileting with min A Baseline: max A-dep Goal status: INITIAL  5.  Pt will increase L shoulder strength by 1 MM grade to assist with repositioning L upper arm away from side for underarm care  and donning shirt. Baseline: L shoulder 0/5 Goal status: INITIAL  6.  Through manual therapy, neuro re-ed, and therapeutic exercises, pt will report 3/10 pain or less throughout the LUE to increase tolerance for engaging the LUE into ADLs.   Baseline: LUE 5/10 pain Goal status: INITIAL  ASSESSMENT:  CLINICAL IMPRESSION: Pt tolerated moist heat to L shoulder well during completion of passive stretching supine on mat table.  Focus today on increasing ER and lengthening pec minor, both in prep for achieving more shoulder flexion.  Pt tolerated neutral ER with elbow at side today after slow, prolonged stretch, as compared to -10 last session.  Reviewed recommendation for pillow placement at the lumbar spine when up in wc to reduce rounded thoracic spine and protracted shoulders.  L hand hygiene looked great today; skin clean and without flaking or odor.  Pt reports that he's been wearing his palm protector.  Fingers more extended away from palm today so resting hand splint was donned.  OT adjusted for comfortable fit and pt was able to wear ~1 hour between OT/PT sessions.  Pt doffed only to enable easier donning of his coat.  OT will communicate recommendation for splinting schedule to spouse as she was not present at session today.  Pt will continue to benefit from skilled OT to work towards optimal positioning of the LUE for contracture prevention, maximize ROM throughout the LUE for ADLs, decrease pain, maximize engagement of the LUE into daily tasks, as well work to maximize level of indep with daily tasks.  PERFORMANCE DEFICITS: in functional skills including ADLs, IADLs, coordination, dexterity, proprioception, sensation, tone, ROM, strength, pain, flexibility, Fine motor control, Gross motor control, mobility, balance, body mechanics, decreased knowledge of use of DME, skin integrity, vision, and UE functional use, cognitive skills including perception and safety awareness, and psychosocial skills  including coping strategies and routines and behaviors.   IMPAIRMENTS: are limiting patient from ADLs, IADLs, leisure, and social participation.   CO-MORBIDITIES: may have co-morbidities  that affects occupational performance. Patient will benefit from skilled OT to address above impairments and improve overall function.  MODIFICATION OR ASSISTANCE TO COMPLETE EVALUATION: No modification of tasks or assist necessary to complete an evaluation.  OT OCCUPATIONAL PROFILE AND HISTORY: Problem focused assessment: Including review of records relating to presenting problem.  CLINICAL DECISION MAKING: Moderate - several treatment options, min-mod task modification necessary  REHAB POTENTIAL: Good  EVALUATION COMPLEXITY: High    PLAN:  OT FREQUENCY: 2x/week  OT DURATION: 12 weeks  PLANNED INTERVENTIONS: self care/ADL training, therapeutic exercise, therapeutic activity, neuromuscular re-education, manual therapy, passive range of motion, balance training, functional mobility training, splinting, electrical stimulation, moist heat, cryotherapy, patient/family education, cognitive remediation/compensation, visual/perceptual remediation/compensation, psychosocial skills training, coping strategies training, and DME and/or AE instructions  RECOMMENDED OTHER SERVICES: N/A  CONSULTED AND AGREED WITH PLAN OF CARE: Patient and family member/caregiver  PLAN FOR NEXT SESSION: see plan  Leta Speller, MS, OTR/L  Darleene Cleaver, OT 06/30/2022, 10:11 AM

## 2022-07-02 ENCOUNTER — Ambulatory Visit: Payer: Medicaid Other | Admitting: Physical Therapy

## 2022-07-02 ENCOUNTER — Ambulatory Visit: Payer: Medicaid Other | Admitting: Occupational Therapy

## 2022-07-07 ENCOUNTER — Ambulatory Visit: Payer: Medicaid Other | Admitting: Occupational Therapy

## 2022-07-07 ENCOUNTER — Ambulatory Visit: Payer: Medicaid Other | Admitting: Physical Therapy

## 2022-07-08 ENCOUNTER — Ambulatory Visit: Payer: Medicaid Other

## 2022-07-08 DIAGNOSIS — R269 Unspecified abnormalities of gait and mobility: Secondary | ICD-10-CM

## 2022-07-08 DIAGNOSIS — R278 Other lack of coordination: Secondary | ICD-10-CM

## 2022-07-08 DIAGNOSIS — M6281 Muscle weakness (generalized): Secondary | ICD-10-CM

## 2022-07-08 DIAGNOSIS — R2681 Unsteadiness on feet: Secondary | ICD-10-CM

## 2022-07-08 DIAGNOSIS — R262 Difficulty in walking, not elsewhere classified: Secondary | ICD-10-CM

## 2022-07-08 NOTE — Therapy (Signed)
OUTPATIENT OCCUPATIONAL THERAPY NEURO TREATMENT  Patient Name: Jacob [name] MRN: [name] DOB:03-Sep-1975, 47 y.o., male Today's Date: 07/08/2022  PCP: Dr. Townsend Roger REFERRING PROVIDER: Dr. Vassie Loll Eyk  END OF SESSION:  OT End of Session - 07/08/22 1141     Visit Number 4    Number of Visits 24    Date for OT Re-Evaluation 09/10/22    Progress Note Due on Visit 10    OT Start Time 0930    OT Stop Time 1015    OT Time Calculation (min) 45 min    Equipment Utilized During Treatment wc    Activity Tolerance Patient limited by fatigue;Other (comment)   Pt reported not feeling well and asked to have BP taken; see note for details   Behavior During Therapy --   Low energy level today            Past Medical History:  Diagnosis Date   Allergy    Diabetes mellitus without complication ([place])    GERD (gastroesophageal reflux disease)    Hypertension    Paralysis ([place])    Stroke [facility])    Past Surgical History:  Procedure Laterality Date   APPENDECTOMY     BUBBLE STUDY  10/02/2021   Procedure: BUBBLE STUDY;  Surgeon: Fay Records, MD;  Location: [place];  Service: Cardiovascular;;   CRANIOPLASTY Right 02/09/2022   Procedure: Craniectomy with Replacement of Bone Flap From the Abdomen;  Surgeon: Newman Pies, MD;  Location: [place];  Service: Neurosurgery;  Laterality: Right;   CRANIOTOMY Right 09/15/2021   Procedure: RIGHT DECOMPRESSIVE CRANIOTOMY, PLACEMENT OF BONE FLAP IN ABDOMEN;  Surgeon: Newman Pies, MD;  Location: [place];  Service: Neurosurgery;  Laterality: Right;   IR CT HEAD LTD  09/12/2021   IR PERCUTANEOUS ART THROMBECTOMY/INFUSION INTRACRANIAL INC DIAG ANGIO  09/12/2021   IR US GUIDE VASC ACCESS RIGHT  09/12/2021   LAPAROSCOPIC APPENDECTOMY  10/24/2013   Procedure: APPENDECTOMY LAPAROSCOPIC;  Surgeon: Shann Medal, MD;  Location: [facility];  Service: General;;   RADIOLOGY WITH ANESTHESIA N/A 09/12/2021   Procedure: IR WITH ANESTHESIA;  Surgeon:  Luanne Bras, MD;  Location: [place];  Service: Radiology;  Laterality: N/A;   TEE WITHOUT CARDIOVERSION N/A 10/02/2021   Procedure: TRANSESOPHAGEAL ECHOCARDIOGRAM (TEE);  Surgeon: Fay Records, MD;  Location: [place];  Service: Cardiovascular;  Laterality: N/A;   Patient Active Problem List   Diagnosis Date Noted   Mild episode of recurrent major depressive disorder ([place]) 06/15/2022   Type 2 diabetes mellitus with hyperglycemia, with long-term current use of insulin ([place])    Debility 02/14/2022   Status post craniectomy 02/09/2022   Memory deficit following cerebral infarction 02/07/2022   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 ([place]) 01/19/2022   Pneumonia of both lower lobes due to methicillin resistant Staphylococcus aureus (MRSA) ([place])    Cerebral embolism with cerebral infarction 09/20/2021   Hypernatremia 09/19/2021   Urinary retention 09/18/2021   Fever 09/18/2021   Acute respiratory failure with hypoxia ([place])    Cerebral edema ([place]) 09/15/2021   Acute ischemic stroke ([place]) 09/12/2021   Dysphagia following cerebral infarction 05/25/2021   Vitamin D deficiency 11/04/2020   Hypertensive urgency 10/31/2020   Hyperlipidemia 03/28/2018   Diabetes mellitus without complication ([place]) [id number]   Essential hypertension 02/23/2018    ONSET DATE: April 2023  REFERRING DIAG: I63.9 (ICD-10-CM) - Cerebral infarction, unspecified   THERAPY DIAG:  Muscle weakness (generalized)  Other lack of coordination  Rationale for Evaluation and Treatment: Rehabilitation  SUBJECTIVE:   SUBJECTIVE STATEMENT: Pt reports being in a hurry this morning and not feeling that great. Pt accompanied by: self and significant other  PERTINENT HISTORY: Per chart, Jacob [name] is a 47 y.o. male with a history of prior CVA with decompressive craniectomy in April 2023.  He has a history of right peroneal DVT on Eliquis.  He has left hemiparesis from stroke.   Per spouse,  pt spent some time in [place], then 2 months at [facility] (SNF). He presented for elective cranioplasty in Sept 2023.  Pt was admitted to rehab 02/14/2022 for inpatient therapies following cranioplasty, and participated in [facility] therapies until recently.      PRECAUTIONS: Fall  WEIGHT BEARING RESTRICTIONS: No  PAIN:  Are you having pain? Yes: NPRS scale: 2 (rest), (activity) 5/10 Pain location: L shoulder sublux Pain description: achy Aggravating factors: movement of the L shoulder  Relieving factors: lying down and gentle stretching   FALLS: Has patient fallen in last 6 months? Yes. Number of falls 1  LIVING ENVIRONMENT: Lives with: lives with their spouse and 65 y/o son, 1 cousin, and 1 friend Lives in: 2 level with master bedroom on the main level, pt resides only on 1st level Stairs: 1 step no rail to enter, pt does have small ramp Has following equipment at home: Hemi walker, Wheelchair (manual), Shower bench, bed side commode, Grab bars, Ramped entry, and platform walker (grab bar by the toilet but not in the shower.  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  PATIENT GOALS: Pt wants to get back to walking and being as indep as possible.   OBJECTIVE:   HAND DOMINANCE: Right  ADLs: Overall ADLs: HHA Mon-Fri 5 hours/day; spouse is pt's primary caregiver Transfers/ambulation related to ADLs: min A sit to stand pivot from wc using gait belt  Eating: assist to cut food  Grooming: min A to manage facial hair UB Dressing: max A LB Dressing: max-dep A Toileting: dep, wears adult briefs.  Spouse reports pt has just started transitioning to use of BSC over the toilet vs voiding in a brief Bathing: max A with sponge/bed bath (in the process of getting bathroom re-done to enable pt to take a shower) Tub Shower transfers: N/A at this time; pt states he doesn't trust himself with the glass doors. Equipment:   see above  IADLs: Shopping: dep Light housekeeping: dep Meal Prep: dep Community mobility: wc dep  Medication management: dep Financial management: dep Handwriting:  Pt is R hand dominant, NT  MOBILITY STATUS:  Pt reports he can amb with hemi-walker 10-20 ft with max A (per spouse) and someone following with a wc behind him  POSTURE COMMENTS:  rounded shoulders, forward head, and L sided hemiplegia (L shoulder subluxation ~1 thumb breadth) Sitting balance:  able to sit edge of mat and tolerate moderate perturbation from all directions.  Pt can reach forward to ankles and return to midline without LOB.  FUNCTIONAL OUTCOME MEASURES: FOTO: 20; predicted 29  UPPER EXTREMITY ROM:    Passive ROM Right Eval (A/PROMWNL) Left eval  Shoulder flexion  78  Shoulder abduction  60  Shoulder adduction    Shoulder extension    Shoulder internal rotation    Shoulder external rotation  -10 (elbow at side)  Elbow flexion    Elbow extension    Wrist flexion  70  Wrist extension  48  Wrist ulnar deviation    Wrist radial deviation    Wrist pronation  90  Wrist supination  60  (Blank rows = not tested)  L hand passively opens ~75% of full extension d/t PIP and MP tightness in all digits.  UPPER EXTREMITY MMT:     MMT Right eval Left eval  Shoulder flexion 5 0  Shoulder abduction 5 0  Shoulder adduction    Shoulder extension    Shoulder internal rotation    Shoulder external rotation    Middle trapezius    Lower trapezius    Elbow flexion 5 0  Elbow extension 5 0  Wrist flexion 5 0  Wrist extension 5 0  Wrist ulnar deviation    Wrist radial deviation    Wrist pronation    Wrist supination    (Blank rows = not tested)  HAND FUNCTION: Grip strength: Right: 95 lbs; Left: NT lbs, Lateral pinch: Right: 25 lbs, Left: NT lbs, and 3 point pinch: Right: 20 lbs, Left: NT lbs  COORDINATION: 9 Hole Peg test: Right: NT sec; Left: unable sec  SENSATION: LUE: Light touch: Impaired   Proprioception: Impaired   EDEMA: None  MUSCLE TONE: LUE: Severe and Hypertonic sublux 1 thumb breadth  COGNITION: Overall cognitive status:  Decreased awareness of deficits  VISION: Subjective report: Pt reports decreased peripheral vision on the L Baseline vision: No visual deficits Visual history: N/A  VISION ASSESSMENT: Tracking/Visual pursuits: Decreased smoothness of eye movement to Left superior field, Decreased smoothness of eye movement to Left inferior field, and Requires cues, head turns, or add eye shifts to track Saccades: additional eye shifts occurred during testing and additional head turns occurred during testing Visual Fields: Left homonymous hemianopsia  PERCEPTION: Not tested, will assess in functional contexts with additional visits  PRAXIS: Impaired: Motor planning  OBSERVATIONS: L hand rests in flexion.  Noted slight odor originating from palm/between fingers.  Skin on volar hand appears to have some yeast.  Encouraged pt/spouse bring hand splint to all sessions to work towards opening up hand for improving skin integrity.  Spouse will bring next week.  TODAY'S TREATMENT:       Moist heat placed on L shoulder during stretches for pain management/muscle relaxation.                                                                                                                         Therapeutic Exercise: Performed passive L scapular retraction and depression.  Performed prolonged stretch for digit ext, isolating 1 digit at a time to maximize stretch at the MP, PIP, and DIP joints of all digits.  Performed passive wrist flex/ext, forearm pron/sup, elbow flex/ext, and L shoulder flex/abd 0-70, IR/ER (ER to -10* with elbow at side), working to reduce pain, tone, muscle stiffness, and reduce contracture risk throughout the LUE.  Fair tolerance, frequent rest breaks needed.    Self Care: Instructed pt in use of reacher and long handled shoe horn  for donning/doffing  socks and shoes.  Replaced standard laces with elastic laces to increase indep with donning shoes.  Pt lacks flexibility to cross either leg, and was unable to reach to don R sock in a long sitting position, and socks were tight, so pt required dep A to don socks each foot, but was able to doff both with reacher and mod vc for technique.  Pt able to doff R/L shoes with reacher and mod vc for technique.  Pt hooked R foot under L to ease doffing L sock and shoe.  Pt used reacher and long shoe horn to don R shoe with min A.  Pt reported that the long handled shoe horn was really helpful in donning R shoe, and stated that he has a shorter one at home.  OT communicated this to spouse via phone and advised on options to obtain long shoe horn if desired.    PATIENT EDUCATION: Education details: LB dressing strategies  Person educated: Patient and Spouse Education method: Explanation and Verbal cues Education comprehension: verbalized understanding and needs further education  HOME EXERCISE PROGRAM: Will initiate within the next 1-2 sessions  GOALS: Goals reviewed with patient? Yes  SHORT TERM GOALS: Target date: 07/30/22 (6 weeks)  Pt/caregivers will demo indep with HEP for reducing pain and stiffness throughout the LUE. Baseline: Not yet initiated Goal status: INITIAL  2.  Pt/caregivers will demo supportive positioning techniques with min vc to support L hemiparetic limb. Baseline: Educ not yet initiated Goal status: INITIAL  3.  Pt/caregivers will follow L hand splinting schedule with at last 50% compliance in order to reduce contracture risk and to optimize skin integrity of L hand. Baseline: Not yet initiated.   Goal status: INITIAL   LONG TERM GOALS: Target date: 09/10/22 (12 weeks)  Pt will increase FOTO score to 29 or more to indicate increased indep with daily tasks.   Baseline: Eval: 20 (predicted 29) Goal status: INITIAL  2.  Pt will transfer wc<>BSC with min guard Baseline:  min-mod A Goal status: INITIAL  3.  Pt will don/doff shirt with min A Baseline: Mod-max A Goal status: INITIAL  4.  Pt will manage clothing for toileting with min A Baseline: max A-dep Goal status: INITIAL  5.  Pt will increase L shoulder strength by 1 MM grade to assist with repositioning L upper arm away from side for underarm care and donning shirt. Baseline: L shoulder 0/5 Goal status: INITIAL  6.  Through manual therapy, neuro re-ed, and therapeutic exercises, pt will report 3/10 pain or less throughout the LUE to increase tolerance for engaging the LUE into ADLs.   Baseline: LUE 5/10 pain Goal status: INITIAL  ASSESSMENT:  CLINICAL IMPRESSION: Pt reports he's been sleeping in his resting hand splint without any discomfort, and does wear occasionally during the day, but admittedly not every day.  OT communicated splinting schedule to spouse this date via phone, with goal to wear at night time and intermittently throughout the day when able.  Spouse verbalized understanding.  Hand hygiene on the L hand continues to look great and OT communicated this to spouse to promote continued carryover.  Pt tolerated LUE PROM fair today, though ER reaching -10 only at max, as previously compared to neutral last session.  Initiated LB dressing training with reacher and long handled shoe horn.  Replaced standard laces with elastic for greater indep in donning shoes.  Pt found long shoe horn to be effective, and pt was able to  doff both socks and shoes with reacher and vc for technique.  Pt used reacher and long shoe horn to don R shoe with min A.  Good attempts at use of equipment to don L shoe, but required max-dep A on this side.  Pt intermittently with head down during session, eventually admitted that he was tired and not feeling well today.  Pt then asked to have a BP check.  Manual BP taken in the RUE at 129/98.  Pt stated that he was in a rush this morning and had not yet taken his meds.  OT  reinforced importance of meds in the morning, with it being nearly lunch time by the time pt arrives back home.  Also relayed this to spouse via phone.  Both verbalized understanding.  Pt will continue to benefit from skilled OT to work towards optimal positioning of the LUE for contracture prevention, maximize ROM throughout the LUE for ADLs, decrease pain, maximize engagement of the LUE into daily tasks, as well work to maximize level of indep with daily tasks.   PERFORMANCE DEFICITS: in functional skills including ADLs, IADLs, coordination, dexterity, proprioception, sensation, tone, ROM, strength, pain, flexibility, Fine motor control, Gross motor control, mobility, balance, body mechanics, decreased knowledge of use of DME, skin integrity, vision, and UE functional use, cognitive skills including perception and safety awareness, and psychosocial skills including coping strategies and routines and behaviors.   IMPAIRMENTS: are limiting patient from ADLs, IADLs, leisure, and social participation.   CO-MORBIDITIES: may have co-morbidities  that affects occupational performance. Patient will benefit from skilled OT to address above impairments and improve overall function.  MODIFICATION OR ASSISTANCE TO COMPLETE EVALUATION: No modification of tasks or assist necessary to complete an evaluation.  OT OCCUPATIONAL PROFILE AND HISTORY: Problem focused assessment: Including review of records relating to presenting problem.  CLINICAL DECISION MAKING: Moderate - several treatment options, min-mod task modification necessary  REHAB POTENTIAL: Good  EVALUATION COMPLEXITY: High    PLAN:  OT FREQUENCY: 2x/week  OT DURATION: 12 weeks  PLANNED INTERVENTIONS: self care/ADL training, therapeutic exercise, therapeutic activity, neuromuscular re-education, manual therapy, passive range of motion, balance training, functional mobility training, splinting, electrical stimulation, moist heat, cryotherapy,  patient/family education, cognitive remediation/compensation, visual/perceptual remediation/compensation, psychosocial skills training, coping strategies training, and DME and/or AE instructions  RECOMMENDED OTHER SERVICES: N/A  CONSULTED AND AGREED WITH PLAN OF CARE: Patient and family member/caregiver  PLAN FOR NEXT SESSION: see plan  Leta Speller, MS, OTR/L  Darleene Cleaver, OT 07/08/2022, 11:45 AM

## 2022-07-08 NOTE — Therapy (Addendum)
Patient Name: Jacob [name] MRN: [id number] DOB:[birth year], 47 y.o., male Today's Date: 07/08/2022  NON- BILLIABLE VISIT  Patient arrived to rehab gym just finishing with OT session and Leta Speller, OT checked patient BP secondary to report of him stating he was not feeling well at all- mostly non-specific- Denied headache or any symptoms of Stroke. Manual BP taken in the RUE at 129/98. Deferred visit today secondary to patient with limited approved visits and will attempt again as scheduled next visit.    Lewis Moccasin, PT 07/08/2022, 10:54 AM

## 2022-07-09 ENCOUNTER — Encounter: Payer: Self-pay | Admitting: Podiatry

## 2022-07-09 ENCOUNTER — Ambulatory Visit (INDEPENDENT_AMBULATORY_CARE_PROVIDER_SITE_OTHER): Payer: Medicaid Other | Admitting: Podiatry

## 2022-07-09 VITALS — BP 130/83 | HR 70

## 2022-07-09 DIAGNOSIS — B351 Tinea unguium: Secondary | ICD-10-CM

## 2022-07-09 DIAGNOSIS — M79676 Pain in unspecified toe(s): Secondary | ICD-10-CM

## 2022-07-09 DIAGNOSIS — Z794 Long term (current) use of insulin: Secondary | ICD-10-CM

## 2022-07-09 NOTE — Progress Notes (Signed)
This patient returns to my office for at risk foot care.  This patient requires this care by a professional since this patient will be at risk due to having CVA and diabetes.  This patient is unable to cut nails himself since the patient cannot reach his nails.These nails are painful walking and wearing shoes.  This patient presents for at risk foot care today.  General Appearance  Alert, conversant and in no acute stress.  Vascular  Dorsalis pedis and posterior tibial  pulses are palpable  right.  Dorsalis pedis and posterior tibial pulses are diminished left foot..  Capillary return is within normal limits  bilaterally. Temperature is within normal limits  bilaterally.  Neurologic  Senn-Weinstein monofilament wire test within normal limits  right. LOPS absent  left.  Nails Thick disfigured discolored nails with subungual debris  from hallux to fifth toes bilaterally. No evidence of bacterial infection or drainage bilaterally.  Orthopedic  No limitations of motion  feet .  No crepitus or effusions noted.  No bony pathology or digital deformities noted.  Skin  normotropic skin with no porokeratosis noted bilaterally.  No signs of infections or ulcers noted.     Onychomycosis  Pain in right toes  Pain in left toes  Consent was obtained for treatment procedures.   Mechanical debridement of nails 1-5  bilaterally performed with a nail nipper.  Filed with dremel without incident.    Return office visit    3 months.                   Told patient to return for periodic foot care and evaluation due to potential at risk complications.Told him to purchase topical fungus medicine if desired.   Gardiner Barefoot DPM

## 2022-07-10 ENCOUNTER — Ambulatory Visit: Payer: Medicaid Other | Admitting: Physical Therapy

## 2022-07-10 ENCOUNTER — Encounter: Payer: Medicaid Other | Admitting: Occupational Therapy

## 2022-07-14 ENCOUNTER — Ambulatory Visit: Payer: Medicaid Other | Admitting: Occupational Therapy

## 2022-07-14 ENCOUNTER — Ambulatory Visit: Payer: Medicaid Other | Admitting: Physical Therapy

## 2022-07-15 ENCOUNTER — Emergency Department (HOSPITAL_COMMUNITY): Payer: Medicaid Other

## 2022-07-15 ENCOUNTER — Other Ambulatory Visit: Payer: Self-pay

## 2022-07-15 ENCOUNTER — Emergency Department (HOSPITAL_COMMUNITY)
Admission: EM | Admit: 2022-07-15 | Discharge: 2022-07-15 | Disposition: A | Discharge status: Home / Self Care | Payer: Medicaid Other | Attending: Student | Admitting: Student

## 2022-07-15 ENCOUNTER — Encounter (HOSPITAL_COMMUNITY): Payer: Self-pay | Admitting: Emergency Medicine

## 2022-07-15 DIAGNOSIS — Z7984 Long term (current) use of oral hypoglycemic drugs: Secondary | ICD-10-CM | POA: Insufficient documentation

## 2022-07-15 DIAGNOSIS — J101 Influenza due to other identified influenza virus with other respiratory manifestations: Secondary | ICD-10-CM | POA: Diagnosis not present

## 2022-07-15 DIAGNOSIS — Z8673 Personal history of transient ischemic attack (TIA), and cerebral infarction without residual deficits: Secondary | ICD-10-CM | POA: Insufficient documentation

## 2022-07-15 DIAGNOSIS — Z794 Long term (current) use of insulin: Secondary | ICD-10-CM | POA: Diagnosis not present

## 2022-07-15 DIAGNOSIS — Z79899 Other long term (current) drug therapy: Secondary | ICD-10-CM | POA: Diagnosis not present

## 2022-07-15 DIAGNOSIS — Z7901 Long term (current) use of anticoagulants: Secondary | ICD-10-CM | POA: Insufficient documentation

## 2022-07-15 DIAGNOSIS — I10 Essential (primary) hypertension: Secondary | ICD-10-CM | POA: Diagnosis not present

## 2022-07-15 DIAGNOSIS — E119 Type 2 diabetes mellitus without complications: Secondary | ICD-10-CM | POA: Insufficient documentation

## 2022-07-15 DIAGNOSIS — Z1152 Encounter for screening for COVID-19: Secondary | ICD-10-CM | POA: Diagnosis not present

## 2022-07-15 DIAGNOSIS — R509 Fever, unspecified: Secondary | ICD-10-CM | POA: Diagnosis present

## 2022-07-15 LAB — RESP PANEL BY RT-PCR (RSV, FLU A&B, COVID)  RVPGX2
Influenza A by PCR: POSITIVE — AB
Influenza B by PCR: NEGATIVE
Resp Syncytial Virus by PCR: NEGATIVE
SARS Coronavirus 2 by RT PCR: NEGATIVE

## 2022-07-15 MED ORDER — ACETAMINOPHEN 500 MG PO TABS: 1000.0000 mg | ORAL_TABLET | Freq: Once | ORAL | Status: DC

## 2022-07-15 MED ORDER — BENZONATATE 100 MG PO CAPS: 100.0000 mg | ORAL_CAPSULE | Freq: Three times a day (TID) | ORAL | 0 refills | Status: DC

## 2022-07-15 MED ORDER — OSELTAMIVIR PHOSPHATE 75 MG PO CAPS: 75.0000 mg | ORAL_CAPSULE | Freq: Two times a day (BID) | ORAL | 0 refills | Status: DC

## 2022-07-15 NOTE — ED Triage Notes (Signed)
Pt BIB [facility], family called for fever  X 3 days, states one of their care givers had Flu A.    BP 160/108 HR 88 Resp 18 O2 94% RA CBG 124

## 2022-07-15 NOTE — ED Provider Notes (Signed)
[facility] Provider Note   CSN: [id number] Arrival date & time: 07/15/22  1645     History  Chief Complaint  Patient presents with   Fever    Cough    Jacob [name] is a 47 y.o. male.  The history is provided by the patient, the EMS personnel, medical records and a caregiver. No language interpreter was used.  Fever      Patient is a 47 yr old male presenting to the ED with a 2-day history of cough and fever. Patient has been having a non-productive cough for the past two days and fever of around 102 according to the patient's caregiver. Patient's caregiver reports testing positive for the flu recently. Patient does not have any other symptoms. He denies chest pain, shortness of breath, nausea or vomiting.   Home Medications Prior to Admission medications   Medication Sig Start Date End Date Taking? Authorizing Provider  acetaminophen (TYLENOL) 325 MG tablet Take 650 mg by mouth every 12 (twelve) hours as needed for moderate pain.    [provider]  amLODipine (NORVASC) 10 MG tablet Take 1 tablet (10 mg total) by mouth daily. 05/29/22   Townsend Roger, MD  apixaban (ELIQUIS) 5 MG TABS tablet Take 1 tablet (5 mg total) by mouth 2 (two) times daily. 04/13/22   Townsend Roger, MD  atorvastatin (LIPITOR) 40 MG tablet Take 1 tablet (40 mg total) by mouth daily. 05/13/22   Garwin Brothers, MD  baclofen (LIORESAL) 10 MG tablet Take 1 tablet (10 mg total) by mouth 3 (three) times daily. 06/24/22   Townsend Roger, MD  blood glucose meter kit and supplies KIT Dispense based on patient and insurance preference. Use up to four times daily as directed. 04/09/22   Townsend Roger, MD  cloNIDine (CATAPRES - DOSED IN MG/24 HR) 0.3 mg/24hr patch Place 1 patch (0.3 mg total) onto the skin once a week. 05/14/22   Garwin Brothers, MD  Continuous Blood Gluc Receiver (DEXCOM G7 RECEIVER) Struble USE TO CHECK FSBS THREE TIMES DAILY 06/23/22   Nona Dell, Corene Cornea, MD   Continuous Blood Gluc Sensor (DEXCOM G7 SENSOR) MISC Use to check FSBS three times daily 06/19/22   Townsend Roger, MD  escitalopram (LEXAPRO) 20 MG tablet Take 1.5 tablets (30 mg total) by mouth daily. 05/13/22   Garwin Brothers, MD  feeding supplement (ENSURE ENLIVE / ENSURE PLUS) LIQD Take 237 mLs by mouth 2 (two) times daily between meals. 03/06/22   Setzer, Edman Circle, PA-C  glucose blood (RELION TRUE METRIX TEST STRIPS) test strip Use as instructed 06/11/22   Nona Dell, Corene Cornea, MD  insulin glargine (LANTUS) 100 UNIT/ML injection Inject 0.1 mLs (10 Units total) into the skin daily. 04/13/22   Townsend Roger, MD  insulin lispro (HUMALOG) 100 UNIT/ML KwikPen Inject 0-20 Units into the skin 3 (three) times daily. Inject 0-20 Units into the skin 3 (three) times daily with meals. CBG < 70 call MD. CBG 70-120 give 0 units; 121-150 give 3 units; 151-200 give 4 units, 201-250 give 7 units; 251-300 give 11 units; 301-350 give 15 units; 351-400 give 20 units; greater than 400 call MD 04/09/22   Townsend Roger, MD  Lancets Ultra Fine MISC Use as directed 04/14/22   Townsend Roger, MD  lisinopril (ZESTRIL) 40 MG tablet Take 1 tablet (40 mg total) by mouth daily. 05/29/22   Townsend Roger, MD  metFORMIN (GLUCOPHAGE) 500 MG  tablet Take 1 tablet (500 mg total) by mouth 2 (two) times daily with a meal. 06/23/22   Nona Dell, Corene Cornea, MD  metoprolol tartrate (LOPRESSOR) 25 MG tablet Take 1 tablet (25 mg total) by mouth 2 (two) times daily. 05/13/22   Garwin Brothers, MD  potassium chloride SA (KLOR-CON M) 20 MEQ tablet Take 1 tablet (20 mEq total) by mouth 2 (two) times daily. 04/13/22   Townsend Roger, MD  sildenafil (VIAGRA) 50 MG tablet Take 1 tablet (50 mg total) by mouth daily as needed for erectile dysfunction. 06/15/22   Townsend Roger, MD  traZODone (DESYREL) 50 MG tablet Take 1 tablet (50 mg total) by mouth at bedtime. 05/13/22   Garwin Brothers, MD      Allergies    Hydrochlorothiazide    Review of Systems   Review of Systems   Constitutional:  Positive for fever.  All other systems reviewed and are negative.   Physical Exam Updated Vital Signs BP 113/79 (BP Location: Right Arm)   Pulse 84   Temp (!) 100.4 F (38 C) (Oral)   Resp 18   Wt 90.7 kg   SpO2 96%   BMI 26.39 kg/m  Physical Exam Vitals and nursing note reviewed.  Constitutional:      General: He is not in acute distress.    Appearance: He is well-developed.  HENT:     Head: Atraumatic.     Comments: Well-appearing cranial surgical scar free of infection. Eyes:     Conjunctiva/sclera: Conjunctivae normal.  Cardiovascular:     Rate and Rhythm: Normal rate and regular rhythm.     Pulses: Normal pulses.     Heart sounds: Normal heart sounds.  Pulmonary:     Breath sounds: Wheezing (Faint expiratory wheezes but otherwise lungs are clear.) present.  Abdominal:     Palpations: Abdomen is soft.  Musculoskeletal:     Cervical back: Neck supple.  Skin:    Findings: No rash.  Neurological:     Mental Status: He is alert. Mental status is at baseline.     Comments: Chronic weakness to left side     ED Results / Procedures / Treatments   Labs (all labs ordered are listed, but only abnormal results are displayed) Labs Reviewed  RESP PANEL BY RT-PCR (RSV, FLU A&B, COVID)  RVPGX2 - Abnormal; Notable for the following components:      Result Value   Influenza A by PCR POSITIVE (*)    All other components within normal limits    EKG None  Radiology DG Chest 2 View  Result Date: 07/15/2022 CLINICAL DATA:  Cough and fever at home. EXAM: CHEST - 2 VIEW COMPARISON:  09/28/2021 FINDINGS: The heart size and mediastinal contours are within normal limits. Both lungs are clear. The visualized skeletal structures are unremarkable. IMPRESSION: No active cardiopulmonary disease. Electronically Signed   By: Lucienne Capers M.D.   On: 07/15/2022 17:54    Procedures Procedures    Medications Ordered in ED Medications  acetaminophen (TYLENOL)  tablet 1,000 mg (1,000 mg Oral Not Given 07/15/22 1701)    ED Course/ Medical Decision Making/ A&P                             Medical Decision Making Amount and/or Complexity of Data Reviewed Radiology: ordered.  Risk OTC drugs.   BP 113/79 (BP Location: Right Arm)   Pulse 84   Temp (!) 100.4 F (  38 C) (Oral)   Resp 18   Wt 90.7 kg   SpO2 96%   BMI 26.39 kg/m   5:58 PM Patient is a 47 year old male significant history of prior stroke status post hematochezia affecting the left nondominant side, diabetes, hypertension, GERD, brought here via EMS accompanied by caregiver for flulike symptoms.  For the past 2 to 3 days patient has had fever as high as 102, and nonproductive cough.  Patient has been taking Tylenol and albuterol as treatment.  No report of nausea vomiting or diarrhea no shortness of breath no other symptoms.  Mild headache only.  Caregiver had test positive for influenza A previously.  On exam, patient is resting comfortably in the bed appears to be in no acute discomfort.  Occasional cough noted.  On lung exam patient does have some mild expiratory wheezes but no rales or rhonchi.  Abdomen is soft nontender.  Left-sided weakness this is chronic.  -Labs ordered, independently viewed and interpreted by me.  Labs remarkable for positive Ifluenza A -The patient was maintained on a cardiac monitor.  I personally viewed and interpreted the cardiac monitored which showed an underlying rhythm of: NSR -Imaging independently viewed and interpreted by me and I agree with radiologist's interpretation.  Result remarkable for CXR showing no acute disease -This patient presents to the ED for concern of cold sxs, this involves an extensive number of treatment options, and is a complaint that carries with it a high risk of complications and morbidity.  The differential diagnosis includes flu, covid, rsv, pna, viral illness, UTI -Co morbidities that complicate the patient evaluation  includes stroke, DM, HTN -Treatment includes tylenol, tessalon -Reevaluation of the patient after these medicines showed that the patient improved -PCP office notes or outside notes reviewed -Escalation to admission/observation considered: patients feels much better, is comfortable with discharge, and will follow up with PCP -Prescription medication considered, patient comfortable with tamiflu, tessalon -Social Determinant of Health considered         Final Clinical Impression(s) / ED Diagnoses Final diagnoses:  Influenza A    Rx / DC Orders ED Discharge Orders          Ordered    oseltamivir (TAMIFLU) 75 MG capsule  Every 12 hours        07/15/22 1807    benzonatate (TESSALON) 100 MG capsule  Every 8 hours        07/15/22 1807              Domenic Moras, PA-C 07/15/22 1811    Kommor, [place], MD 07/16/22 1320

## 2022-07-15 NOTE — ED Triage Notes (Signed)
Pt here from home with c/o cough and fever at home , family gave pt tylenol l and an albuterol treatment prior to arrival

## 2022-07-15 NOTE — Discharge Instructions (Signed)
You have been evaluated for your symptoms.  You have test positive for influenza A.  Please take Tamiflu as prescribed as it may help shorten the course of your symptoms.  You may take Tessalon for cough.  Continue to take Tylenol at home for your fever.  Stay hydrated, get plenty of rest, follow-up with your doctor for reassessment and for further care.

## 2022-07-16 ENCOUNTER — Ambulatory Visit: Payer: Medicaid Other | Admitting: Physical Therapy

## 2022-07-16 ENCOUNTER — Ambulatory Visit: Payer: Medicaid Other | Admitting: Occupational Therapy

## 2022-07-23 ENCOUNTER — Encounter: Payer: Medicaid Other | Admitting: Occupational Therapy

## 2022-07-23 ENCOUNTER — Ambulatory Visit: Payer: Medicaid Other

## 2022-07-23 ENCOUNTER — Ambulatory Visit: Payer: Medicaid Other | Admitting: Physical Therapy

## 2022-07-23 ENCOUNTER — Encounter: Payer: Self-pay | Admitting: Physical Therapy

## 2022-07-23 DIAGNOSIS — M6281 Muscle weakness (generalized): Secondary | ICD-10-CM

## 2022-07-23 DIAGNOSIS — R278 Other lack of coordination: Secondary | ICD-10-CM

## 2022-07-23 DIAGNOSIS — R262 Difficulty in walking, not elsewhere classified: Secondary | ICD-10-CM

## 2022-07-23 DIAGNOSIS — R269 Unspecified abnormalities of gait and mobility: Secondary | ICD-10-CM

## 2022-07-23 DIAGNOSIS — R2681 Unsteadiness on feet: Secondary | ICD-10-CM

## 2022-07-23 NOTE — Therapy (Addendum)
OUTPATIENT OCCUPATIONAL THERAPY NEURO TREATMENT  Patient Name: Jacob [name] MRN: [name] DOB:09-Oct-1975, 47 y.o., male Today's Date: 07/23/2022  PCP: Dr. Townsend Roger REFERRING PROVIDER: Dr. Vassie Loll Eyk  END OF SESSION:  OT End of Session - 07/23/22 2112     Visit Number 5    Number of Visits 24    Date for OT Re-Evaluation 09/10/22    Progress Note Due on Visit 10    OT Start Time 1110    OT Stop Time 1145    OT Time Calculation (min) 35 min    Equipment Utilized During Treatment wc    Activity Tolerance Patient tolerated treatment well    Behavior During Therapy WFL for tasks assessed/performed             Past Medical History:  Diagnosis Date   Allergy    Diabetes mellitus without complication ([place])    GERD (gastroesophageal reflux disease)    Hypertension    Paralysis ([place])    Stroke [facility])    Past Surgical History:  Procedure Laterality Date   APPENDECTOMY     BUBBLE STUDY  10/02/2021   Procedure: BUBBLE STUDY;  Surgeon: Fay Records, MD;  Location: [place];  Service: Cardiovascular;;   CRANIOPLASTY Right 02/09/2022   Procedure: Craniectomy with Replacement of Bone Flap From the Abdomen;  Surgeon: Newman Pies, MD;  Location: [place];  Service: Neurosurgery;  Laterality: Right;   CRANIOTOMY Right 09/15/2021   Procedure: RIGHT DECOMPRESSIVE CRANIOTOMY, PLACEMENT OF BONE FLAP IN ABDOMEN;  Surgeon: Newman Pies, MD;  Location: [place];  Service: Neurosurgery;  Laterality: Right;   IR CT HEAD LTD  09/12/2021   IR PERCUTANEOUS ART THROMBECTOMY/INFUSION INTRACRANIAL INC DIAG ANGIO  09/12/2021   IR US GUIDE VASC ACCESS RIGHT  09/12/2021   LAPAROSCOPIC APPENDECTOMY  10/24/2013   Procedure: APPENDECTOMY LAPAROSCOPIC;  Surgeon: Shann Medal, MD;  Location: [facility];  Service: General;;   RADIOLOGY WITH ANESTHESIA N/A 09/12/2021   Procedure: IR WITH ANESTHESIA;  Surgeon: Luanne Bras, MD;  Location: [place];  Service: Radiology;  Laterality: N/A;   TEE  WITHOUT CARDIOVERSION N/A 10/02/2021   Procedure: TRANSESOPHAGEAL ECHOCARDIOGRAM (TEE);  Surgeon: Fay Records, MD;  Location: [place];  Service: Cardiovascular;  Laterality: N/A;   Patient Active Problem List   Diagnosis Date Noted   Mild episode of recurrent major depressive disorder ([place]) 06/15/2022   Type 2 diabetes mellitus with hyperglycemia, with long-term current use of insulin ([place])    Debility 02/14/2022   Status post craniectomy 02/09/2022   Memory deficit following cerebral infarction 02/07/2022   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 ([place]) 01/19/2022   Pneumonia of both lower lobes due to methicillin resistant Staphylococcus aureus (MRSA) ([place])    Cerebral embolism with cerebral infarction 09/20/2021   Hypernatremia 09/19/2021   Urinary retention 09/18/2021   Fever 09/18/2021   Acute respiratory failure with hypoxia ([facility])    Cerebral edema ([place]) 09/15/2021   Acute ischemic stroke ([place]) 09/12/2021   Dysphagia following cerebral infarction 05/25/2021   Vitamin D deficiency 11/04/2020   Hypertensive urgency 10/31/2020   Hyperlipidemia 03/28/2018   Diabetes mellitus without complication ([place]) [id number]   Essential hypertension 02/23/2018    ONSET DATE: April 2023  REFERRING DIAG: I63.9 (ICD-10-CM) - Cerebral infarction, unspecified   THERAPY DIAG:  Muscle weakness (generalized)  Other lack of coordination  Rationale for Evaluation and Treatment: Rehabilitation  SUBJECTIVE:  SUBJECTIVE STATEMENT: Pt arrived late (taking transportation). Reports he's  feeling better after being sick last week. Pt accompanied by: self and significant other  PERTINENT HISTORY: Per chart, Jacob [name] is a 47 y.o. male with a history of prior CVA with decompressive craniectomy in April 2023.  He has a history of right peroneal DVT on Eliquis.  He has left hemiparesis from stroke.   Per spouse, pt spent some time in [place], then 2 months at  [facility] (SNF). He presented for elective cranioplasty in Sept 2023.  Pt was admitted to rehab 02/14/2022 for inpatient therapies following cranioplasty, and participated in [facility] therapies until recently.      PRECAUTIONS: Fall  WEIGHT BEARING RESTRICTIONS: No  PAIN:  Are you having pain? Yes: NPRS scale: 2 (rest), (activity) 5/10 Pain location: L shoulder sublux Pain description: achy Aggravating factors: movement of the L shoulder  Relieving factors: lying down and gentle stretching   FALLS: Has patient fallen in last 6 months? Yes. Number of falls 1  LIVING ENVIRONMENT: Lives with: lives with their spouse and 59 y/o son, 1 cousin, and 1 friend Lives in: 2 level with master bedroom on the main level, pt resides only on 1st level Stairs: 1 step no rail to enter, pt does have small ramp Has following equipment at home: Hemi walker, Wheelchair (manual), Shower bench, bed side commode, Grab bars, Ramped entry, and platform walker (grab bar by the toilet but not in the shower.  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  PATIENT GOALS: Pt wants to get back to walking and being as indep as possible.   OBJECTIVE:   HAND DOMINANCE: Right  ADLs: Overall ADLs: HHA Mon-Fri 5 hours/day; spouse is pt's primary caregiver Transfers/ambulation related to ADLs: min A sit to stand pivot from wc using gait belt  Eating: assist to cut food  Grooming: min A to manage facial hair UB Dressing: max A LB Dressing: max-dep A Toileting: dep, wears adult briefs.  Spouse reports pt has just started transitioning to use of BSC over the toilet vs voiding in a brief Bathing: max A with sponge/bed bath (in the process of getting bathroom re-done to enable pt to take a shower) Tub Shower transfers: N/A at this time; pt states he doesn't trust himself with the glass doors. Equipment:  see above  IADLs: Shopping: dep Light housekeeping:  dep Meal Prep: dep Community mobility: wc dep  Medication management: dep Financial management: dep Handwriting:  Pt is R hand dominant, NT  MOBILITY STATUS:  Pt reports he can amb with hemi-walker 10-20 ft with max A (per spouse) and someone following with a wc behind him  POSTURE COMMENTS:  rounded shoulders, forward head, and L sided hemiplegia (L shoulder subluxation ~1 thumb breadth) Sitting balance:  able to sit edge of mat and tolerate moderate perturbation from all directions.  Pt can reach forward to ankles and return to midline without LOB.  FUNCTIONAL OUTCOME MEASURES: FOTO: 20; predicted 29  UPPER EXTREMITY ROM:    Passive ROM Right Eval (A/PROMWNL) Left eval  Shoulder flexion  78  Shoulder abduction  60  Shoulder adduction    Shoulder extension    Shoulder internal rotation    Shoulder external rotation  -10 (elbow at side)  Elbow flexion    Elbow extension    Wrist flexion  70  Wrist extension  48  Wrist ulnar deviation    Wrist radial deviation    Wrist pronation  90  Wrist supination  60  (Blank rows = not tested)  L hand passively opens ~75% of full extension d/t PIP and MP tightness in all digits.  UPPER EXTREMITY MMT:     MMT Right eval Left eval  Shoulder flexion 5 0  Shoulder abduction 5 0  Shoulder adduction    Shoulder extension    Shoulder internal rotation    Shoulder external rotation    Middle trapezius    Lower trapezius    Elbow flexion 5 0  Elbow extension 5 0  Wrist flexion 5 0  Wrist extension 5 0  Wrist ulnar deviation    Wrist radial deviation    Wrist pronation    Wrist supination    (Blank rows = not tested)  HAND FUNCTION: Grip strength: Right: 95 lbs; Left: NT lbs, Lateral pinch: Right: 25 lbs, Left: NT lbs, and 3 point pinch: Right: 20 lbs, Left: NT lbs  COORDINATION: 9 Hole Peg test: Right: NT sec; Left: unable sec  SENSATION: LUE: Light touch: Impaired  Proprioception: Impaired   EDEMA: None  MUSCLE  TONE: LUE: Severe and Hypertonic sublux 1 thumb breadth  COGNITION: Overall cognitive status:  Decreased awareness of deficits  VISION: Subjective report: Pt reports decreased peripheral vision on the L Baseline vision: No visual deficits Visual history: N/A  VISION ASSESSMENT: Tracking/Visual pursuits: Decreased smoothness of eye movement to Left superior field, Decreased smoothness of eye movement to Left inferior field, and Requires cues, head turns, or add eye shifts to track Saccades: additional eye shifts occurred during testing and additional head turns occurred during testing Visual Fields: Left homonymous hemianopsia  PERCEPTION: Not tested, will assess in functional contexts with additional visits  PRAXIS: Impaired: Motor planning  OBSERVATIONS: L hand rests in flexion.  Noted slight odor originating from palm/between fingers.  Skin on volar hand appears to have some yeast.  Encouraged pt/spouse bring hand splint to all sessions to work towards opening up hand for improving skin integrity.  Spouse will bring next week.  TODAY'S TREATMENT:       Moist heat placed on L shoulder during stretches for pain management/muscle relaxation.                                                                                                                         Therapeutic Exercise:  Performed passive L scapular retraction and depression.  Performed prolonged passive stretch for digit ext, isolating 1 digit at a time to maximize stretch at the MP, PIP, and DIP joints of all digits.  Performed passive wrist flex/ext, forearm pron/sup, elbow flex/ext, and L shoulder flex/abd 0-70, IR/ER (ER to -30* with elbow at side), working to reduce pain, tone, muscle stiffness, and reduce contracture risk throughout the LUE.  Fair tolerance, frequent rest breaks needed.  Performed active assisted bilat shoulder shrugs and retraction x2 sets 10 reps each.  Manual scapular gliding all planes to promote L  shoulder mobility.  Incorporated visual scanning to  L visual field throughout passive stretching, with pt naming objects around the room to his L.  OT encouraged progression with increasing range of head turns with each round of naming objects; not yet able to look behind L shoulder but in line with.  Reviewed self PROM strategies with pt.  Able to return demo for L shoulder ER with elbow at side with mod vc/tactile cues.    PATIENT EDUCATION: Education details: benefits of consistent use of L resting hand splint to prevent contractures Person educated: Patient and Spouse Education method: Explanation and Verbal cues Education comprehension: verbalized understanding and needs further education  HOME EXERCISE PROGRAM: Will initiate within the next 1-2 sessions  GOALS: Goals reviewed with patient? Yes  SHORT TERM GOALS: Target date: 07/30/22 (6 weeks)  Pt/caregivers will demo indep with HEP for reducing pain and stiffness throughout the LUE. Baseline: Not yet initiated Goal status: INITIAL  2.  Pt/caregivers will demo supportive positioning techniques with min vc to support L hemiparetic limb. Baseline: Educ not yet initiated Goal status: INITIAL  3.  Pt/caregivers will follow L hand splinting schedule with at last 50% compliance in order to reduce contracture risk and to optimize skin integrity of L hand. Baseline: Not yet initiated.   Goal status: INITIAL   LONG TERM GOALS: Target date: 09/10/22 (12 weeks)  Pt will increase FOTO score to 29 or more to indicate increased indep with daily tasks.   Baseline: Eval: 20 (predicted 29) Goal status: INITIAL  2.  Pt will transfer wc<>BSC with min guard Baseline: min-mod A Goal status: INITIAL  3.  Pt will don/doff shirt with min A Baseline: Mod-max A Goal status: INITIAL  4.  Pt will manage clothing for toileting with min A Baseline: max A-dep Goal status: INITIAL  5.  Pt will increase L shoulder strength by 1 MM grade to assist  with repositioning L upper arm away from side for underarm care and donning shirt. Baseline: L shoulder 0/5 Goal status: INITIAL  6.  Through manual therapy, neuro re-ed, and therapeutic exercises, pt will report 3/10 pain or less throughout the LUE to increase tolerance for engaging the LUE into ADLs.   Baseline: LUE 5/10 pain Goal status: INITIAL  ASSESSMENT:  CLINICAL IMPRESSION: Pt reports he's feeling better after being sick with the flu last week.  OT assisted pt to don resting hand splint following PROM to the L wrist and hand this date.  Pt reports inconsistent use of splint at home.  OT provided review of benefits for regular splint use to prevent L hand contractures.  Pt verbalized understanding.  Incorporated visual scanning during PROM throughout the LUE as pt has R gaze preference and prominent L neglect.  Pt practiced head turns to look at objects in L visual field.  Not yet able to look behind L shoulder.  L shoulder remains very stiff, painful, with significantly limited ER, passively achieving only up to -30 degrees today with elbow at side.  Pt responded well to heat, gentle passive stretching, and manual scapular gliding to maximize shoulder mobility.  Rolled towel was place at the lumbar spine while pt sat in wc in order to promote more erect sitting posture, as pt sits with round back and shoulders in his wc.  Pt will continue to benefit from skilled OT to work towards optimal positioning of the LUE for contracture prevention, maximize ROM throughout the LUE for ADLs, decrease pain, maximize engagement of the LUE into daily tasks, as well work  to maximize level of indep with daily tasks.   PERFORMANCE DEFICITS: in functional skills including ADLs, IADLs, coordination, dexterity, proprioception, sensation, tone, ROM, strength, pain, flexibility, Fine motor control, Gross motor control, mobility, balance, body mechanics, decreased knowledge of use of DME, skin integrity, vision, and  UE functional use, cognitive skills including perception and safety awareness, and psychosocial skills including coping strategies and routines and behaviors.   IMPAIRMENTS: are limiting patient from ADLs, IADLs, leisure, and social participation.   CO-MORBIDITIES: may have co-morbidities  that affects occupational performance. Patient will benefit from skilled OT to address above impairments and improve overall function.  MODIFICATION OR ASSISTANCE TO COMPLETE EVALUATION: No modification of tasks or assist necessary to complete an evaluation.  OT OCCUPATIONAL PROFILE AND HISTORY: Problem focused assessment: Including review of records relating to presenting problem.  CLINICAL DECISION MAKING: Moderate - several treatment options, min-mod task modification necessary  REHAB POTENTIAL: Good  EVALUATION COMPLEXITY: High    PLAN:  OT FREQUENCY: 2x/week  OT DURATION: 12 weeks  PLANNED INTERVENTIONS: self care/ADL training, therapeutic exercise, therapeutic activity, neuromuscular re-education, manual therapy, passive range of motion, balance training, functional mobility training, splinting, electrical stimulation, moist heat, cryotherapy, patient/family education, cognitive remediation/compensation, visual/perceptual remediation/compensation, psychosocial skills training, coping strategies training, and DME and/or AE instructions  RECOMMENDED OTHER SERVICES: N/A  CONSULTED AND AGREED WITH PLAN OF CARE: Patient and family member/caregiver  PLAN FOR NEXT SESSION: see plan  Leta Speller, MS, OTR/L  Darleene Cleaver, OT 07/23/2022, 9:15 PM

## 2022-07-23 NOTE — Therapy (Signed)
OUTPATIENT PHYSICAL THERAPY NEURO TREATMENT   Patient Name: Jacob [name] MRN: [id number] DOB:28-Aug-1975, 47 y.o., male Today's Date: 07/23/2022   PCP: Dr. Townsend Roger REFERRING PROVIDER: Dr. Vassie Loll Eyk  END OF SESSION:  PT End of Session - 07/23/22 1146     Visit Number 4    Number of Visits 24    Date for PT Re-Evaluation 09/15/22    Authorization Type [place] Medicaid    Progress Note Due on Visit 10    PT Start Time [id number]    PT Stop Time 1227    PT Time Calculation (min) 42 min    Equipment Utilized During Treatment Gait belt    Activity Tolerance Patient tolerated treatment well    Behavior During Therapy WFL for tasks assessed/performed               Past Medical History:  Diagnosis Date   Allergy    Diabetes mellitus without complication ([place])    GERD (gastroesophageal reflux disease)    Hypertension    Paralysis ([place])    Stroke [facility])    Past Surgical History:  Procedure Laterality Date   APPENDECTOMY     BUBBLE STUDY  10/02/2021   Procedure: BUBBLE STUDY;  Surgeon: Fay Records, MD;  Location: [facility];  Service: Cardiovascular;;   CRANIOPLASTY Right 02/09/2022   Procedure: Craniectomy with Replacement of Bone Flap From the Abdomen;  Surgeon: Newman Pies, MD;  Location: [facility];  Service: Neurosurgery;  Laterality: Right;   CRANIOTOMY Right 09/15/2021   Procedure: RIGHT DECOMPRESSIVE CRANIOTOMY, PLACEMENT OF BONE FLAP IN ABDOMEN;  Surgeon: Newman Pies, MD;  Location: [facility];  Service: Neurosurgery;  Laterality: Right;   IR CT HEAD LTD  09/12/2021   IR PERCUTANEOUS ART THROMBECTOMY/INFUSION INTRACRANIAL INC DIAG ANGIO  09/12/2021   IR US GUIDE VASC ACCESS RIGHT  09/12/2021   LAPAROSCOPIC APPENDECTOMY  10/24/2013   Procedure: APPENDECTOMY LAPAROSCOPIC;  Surgeon: Shann Medal, MD;  Location: [facility];  Service: General;;   RADIOLOGY WITH ANESTHESIA N/A 09/12/2021   Procedure: IR WITH ANESTHESIA;  Surgeon: Luanne Bras, MD;  Location: [facility];   Service: Radiology;  Laterality: N/A;   TEE WITHOUT CARDIOVERSION N/A 10/02/2021   Procedure: TRANSESOPHAGEAL ECHOCARDIOGRAM (TEE);  Surgeon: Fay Records, MD;  Location: [facility];  Service: Cardiovascular;  Laterality: N/A;   Patient Active Problem List   Diagnosis Date Noted   Mild episode of recurrent major depressive disorder ([place]) 06/15/2022   Type 2 diabetes mellitus with hyperglycemia, with long-term current use of insulin ([place])    Debility 02/14/2022   Status post craniectomy 02/09/2022   Memory deficit following cerebral infarction 02/07/2022   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 ([place]) 01/19/2022   Pneumonia of both lower lobes due to methicillin resistant Staphylococcus aureus (MRSA) ([place])    Cerebral embolism with cerebral infarction 09/20/2021   Hypernatremia 09/19/2021   Urinary retention 09/18/2021   Fever 09/18/2021   Acute respiratory failure with hypoxia ([place])    Cerebral edema ([place]) 09/15/2021   Acute ischemic stroke ([place]) 09/12/2021   Dysphagia following cerebral infarction 05/25/2021   Vitamin D deficiency 11/04/2020   Hypertensive urgency 10/31/2020   Hyperlipidemia 03/28/2018   Diabetes mellitus without complication ([place]) [id number]   Essential hypertension 02/23/2018    ONSET DATE: 08/2021  REFERRING DIAG:  Diagnosis  I63.9 (ICD-10-CM) - Cerebral infarction, unspecified  R53.81 (ICD-10-CM) - Other malaise    THERAPY DIAG:  Muscle weakness (generalized)  Abnormality of  gait and mobility  Difficulty in walking, not elsewhere classified  Unsteadiness on feet  Rationale for Evaluation and Treatment: Rehabilitation  SUBJECTIVE:                                                                                                                                                                                             SUBJECTIVE STATEMENT: Pt reports no falls since last session. Pt has been recovering from the flu. He is still  experiencing some fatigue at this time as a result.  Pt accompanied by: significant other- Wife  PERTINENT HISTORY: Patient is a 47 year old male with recent history of CVA  in April 2023 and then elective cranioplasty in September 2023 with subsequent inpatient rehab and then home health services. Patient has left hemiparesis.   PAIN:  Are you having pain? No  PRECAUTIONS: Fall  WEIGHT BEARING RESTRICTIONS: No  FALLS: Has patient fallen in last 6 months? Yes. 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   RADIATION DOSE REDUCTION: This exam was performed according to the departmental dose-optimization program which includes automated exposure control, adjustment of the mA and/or kV according to patient size and/or use of iterative reconstruction technique.   COMPARISON:  09/19/2021   FINDINGS: Brain: Diffuse cytotoxic edema is noted throughout the distribution of the right MCA and PCA watershed region similar to that seen on the prior exam. No parenchymal hemorrhage is seen. No new focal infarct is seen.   Vascular: No hyperdense vessel or unexpected  calcification.   Skull: Large calvarial defect there is noted on the right stable from the prior study.   Sinuses/Orbits: Paranasal sinuses are within normal limits.   Other: Gastric catheter and feeding catheter are noted extending through the nose.   IMPRESSION: Right MCA/PCA infarct stable in appearance from the  previous exam. No acute hemorrhage is noted.   Right-sided hemi craniectomy stable from the prior exam. No new focal abnormality is noted.     Electronically Signed   By: Inez Catalina M.D.   On: 09/24/2021 02:47  COGNITION: Overall cognitive status: Impaired-decreased awareness of functional limitations   SENSATION: Light touch: Impaired - decreased  COORDINATION:  Left side hemiparesis  EDEMA:  None observed  MUSCLE TONE: LLE: Hypotonic   POSTURE: rounded shoulders and forward head  LOWER EXTREMITY ROM:     Active  Right Eval Left Eval  Hip flexion    Hip extension    Hip abduction    Hip adduction    Hip internal rotation    Hip external rotation    Knee flexion    Knee extension    Ankle dorsiflexion    Ankle plantarflexion    Ankle inversion    Ankle eversion     (Blank rows = not tested)  LOWER EXTREMITY MMT:    MMT Right Eval Left Eval  Hip flexion 5 2-  Hip extension 5 2-  Hip abduction 5 2-  Hip adduction 5 2-  Hip internal rotation 5 2-  Hip external rotation 5 2-  Knee flexion 5 2-  Knee extension 5 2-  Ankle dorsiflexion 5  0   Ankle plantarflexion    Ankle inversion    Ankle eversion    (Blank rows = not tested)  BED MOBILITY:  Not tested  TRANSFERS: Assistive device utilized: Hemi walker  Sit to stand: Mod A Stand to sit: Mod A Chair to chair: Mod A Floor:  Not tested   STAIRS: *Patient adamantly wanted to attempt today Level of Assistance: Max A Stair Negotiation Technique: Step to Pattern with Bilateral Rails Number of Stairs: 1  Height of Stairs: 6"  Comments: Patient wanted to attempt steps  continuously requesting to try- He was able to stand from w/c with mod assist using right arm for support on armrest of chair with gait belt. Once in standing he was able to step up with right LE with physical assist to block his left LE yet able to flex left hip/knee enough to clear 1st step- positioned back in chair.   GAIT: Gait pattern: step to pattern Distance walked: 8 feet Assistive device utilized: Hemi walker Level of assistance: Mod A Comments: Patient required VC for seqencing and use of left AFO today. - fatigues very quickly and left knee buckling with weight bearing.   FUNCTIONAL TESTS:  5 times sit to stand: Patient required mod Assist to stand and unable to complete on his own.  PATIENT SURVEYS:  FOTO 36; predicted goal= 47  TODAY'S TREATMENT:                                                                                                                              DATE: 07/23/22  Patient requires min to mod assist with all transfers for  safety and contact-guard assistance standing at all times for safety.  Note: Portions of this document were prepared using Dragon voice recognition software and although reviewed may contain unintentional dictation errors in syntax, grammar, or spelling.   TE  Seated LAQ and march 2*10 AAROM on the L Seated marching 2 x 10 AAROM L only  Donned L LE AFO  Transitioned to // bars  STS transfer performs with CGA and significant UE assist.  Worked on weight shift to the left side x several minutes, requests rest due to fatigue.   Seated soccer kicks x 0 for quad and hip flexor activation on the R x 10  - assistance with return of LLE to start position   STS transfer performs with CGA and significant UE assist.  Worked on weight shift to the left side x several minutes, requests rest due to fatigue.  - Mirror placed anterior for feedback   Seated HS curl isometric against resistance band  Seated WC push/ pull x 10 with mod A from  PT, after first 6-7 reps pt requires cues for attention   Unless otherwise stated, CGA was provided and gait belt donned in order to ensure pt safety   PATIENT EDUCATION: Education details: PT plan of care and HEP Person educated: Patient and Spouse Education method: Explanation, Demonstration, Tactile cues, Verbal cues, and Handouts Education comprehension: verbalized understanding, returned demonstration, verbal cues required, tactile cues required, and needs further education  HOME EXERCISE PROGRAM: Access Code: [email or web address] URL: [email or web address] Date: 06/23/2022 Prepared by: Sande Brothers  Exercises - Supine Quad Set  - 1 x daily - 7 x weekly - 3 sets - 10 reps - Seated Long Arc Quad  - 1 x daily - 7 x weekly - 3 sets - 10 reps - Seated March  - 1 x daily - 7 x weekly - 3 sets - 10 reps - Supine Bridge  - 1 x daily - 7 x weekly - 3 sets - 10 reps - Sit to Stand with Counter Support  - 1 x daily - 7 x weekly - 3 sets - 10 reps  GOALS: Goals reviewed with patient? Yes  SHORT TERM GOALS: Target date: 08/04/2022  Pt will be independent with HEP in order to improve strength and balance in order to decrease fall risk and improve function at home and work.  Baseline: EVAL-Patient with no formal HEP in place Goal status: INITIAL  2.  Patient will perform all stand pivot transfers with min Assist using hemi-walker for improved functional independence with mobility in home.  Baseline: EVAL= Mod A with transfers Goal status: INITIAL    LONG TERM GOALS: Target date: 09/15/2022  Pt will improve FOTO to target score of 47  to display perceived improvements in ability to complete ADL's.  Baseline: EVAL= 36 Goal status: INITIAL  2.  Pt will achieve 5x STS in less than 60 seconds in order to demonstrate clinically significant improvement in LE strength. Baseline: EVAL- Mod A with sit to stand transfer Goal status: INITIAL INITIAL 3.  Patient will ambulate > 50  feet with hemiwalker using step to gait and CGA for improved household mobility.  Baseline: EVAL- 8 feet with hemiwalker and mod A Goal status: INITIAL  4.  Patient will perform all transfers with CGA  including w/c to various surfaces- toilet, bed, chairs using hemiwalker for improved functional independence with mobility.  Baseline:  EVAL- Mod asst with all transfers Goal status: INITIAL   ASSESSMENT:  CLINICAL IMPRESSION:  Patient presents with good motivation for completion of physical therapy activities.  Pt had increased fatigue secondary to recent flu illness. Pt had minimal LLE muscle activation and very poor standing posture prior to mirror feedback. Pt is eager to get back to walking and higher level activities like stairs although these activities may not be safe at the time. Will continue to encourage progression to this in future sessions rather than "just going for it".  Pt will continue to benefit from skilled physical therapy intervention to address impairments, improve QOL, and attain therapy goals.       OBJECTIVE IMPAIRMENTS: Abnormal gait, cardiopulmonary status limiting activity, decreased activity tolerance, decreased balance, decreased cognition, decreased coordination, decreased endurance, decreased mobility, difficulty walking, decreased strength, decreased safety awareness, impaired perceived functional ability, impaired sensation, and impaired UE functional use.   ACTIVITY LIMITATIONS: carrying, lifting, bending, standing, squatting, stairs, transfers, and bed mobility  PARTICIPATION LIMITATIONS: meal prep, cleaning, laundry, driving, shopping, community activity, occupation, and yard work  PERSONAL FACTORS: Time since onset of injury/illness/exacerbation and 1-2 comorbidities: HTN, DM  are also affecting patient's functional outcome.   REHAB POTENTIAL: Good  CLINICAL DECISION MAKING: Evolving/moderate complexity  EVALUATION COMPLEXITY: Moderate  PLAN:  PT  FREQUENCY: 1-2x/week  PT DURATION: 12 weeks  PLANNED INTERVENTIONS: Therapeutic exercises, Therapeutic activity, Neuromuscular re-education, Balance training, Gait training, Patient/Family education, Self Care, Joint mobilization, Stair training, Orthotic/Fit training, DME instructions, Dry Needling, Electrical stimulation, Wheelchair mobility training, Spinal mobilization, Cryotherapy, Moist heat, Splintting, Taping, and Manual therapy  PLAN FOR NEXT SESSION: Review and progress LE strengthening. Transfer training for safe mobility, Gait training with hemiwalker and close follow with w/c. Added to HEP as appropriate.    Particia Lather, PT 07/23/2022, 2:06 PM

## 2022-07-28 ENCOUNTER — Ambulatory Visit: Payer: Medicaid Other | Admitting: Occupational Therapy

## 2022-07-28 ENCOUNTER — Ambulatory Visit: Payer: Medicaid Other

## 2022-07-29 ENCOUNTER — Ambulatory Visit: Payer: Medicaid Other | Attending: Internal Medicine

## 2022-07-29 ENCOUNTER — Ambulatory Visit: Payer: Medicaid Other

## 2022-07-29 DIAGNOSIS — R269 Unspecified abnormalities of gait and mobility: Secondary | ICD-10-CM | POA: Diagnosis present

## 2022-07-29 DIAGNOSIS — M6281 Muscle weakness (generalized): Secondary | ICD-10-CM

## 2022-07-29 DIAGNOSIS — R278 Other lack of coordination: Secondary | ICD-10-CM | POA: Diagnosis present

## 2022-07-29 DIAGNOSIS — R2681 Unsteadiness on feet: Secondary | ICD-10-CM | POA: Diagnosis present

## 2022-07-29 DIAGNOSIS — R262 Difficulty in walking, not elsewhere classified: Secondary | ICD-10-CM | POA: Diagnosis present

## 2022-07-29 NOTE — Therapy (Signed)
OUTPATIENT PHYSICAL THERAPY NEURO TREATMENT   Patient Name: Jacob [name] MRN: [name] DOB:[birth year], 47 y.o., male Today's Date: 07/29/2022   PCP: Dr. Townsend Roger REFERRING PROVIDER: Dr. Vassie Loll Eyk  END OF SESSION:  PT End of Session - 07/29/22 0857     Visit Number 5    Number of Visits 24    Date for PT Re-Evaluation 09/15/22    Authorization Type [place] [employer]    Progress Note Due on Visit 10    PT Start Time 0856    PT Stop Time 0929    PT Time Calculation (min) 33 min    Equipment Utilized During Treatment Gait belt    Activity Tolerance Patient tolerated treatment well    Behavior During Therapy WFL for tasks assessed/performed                Past Medical History:  Diagnosis Date   Allergy    Diabetes mellitus without complication ([place])    GERD (gastroesophageal reflux disease)    Hypertension    Paralysis ([place])    Stroke [facility])    Past Surgical History:  Procedure Laterality Date   APPENDECTOMY     BUBBLE STUDY  10/02/2021   Procedure: BUBBLE STUDY;  Surgeon: Fay Records, MD;  Location: [place];  Service: Cardiovascular;;   CRANIOPLASTY Right 02/09/2022   Procedure: Craniectomy with Replacement of Bone Flap From the Abdomen;  Surgeon: Newman Pies, MD;  Location: [place];  Service: Neurosurgery;  Laterality: Right;   CRANIOTOMY Right 09/15/2021   Procedure: RIGHT DECOMPRESSIVE CRANIOTOMY, PLACEMENT OF BONE FLAP IN ABDOMEN;  Surgeon: Newman Pies, MD;  Location: [place];  Service: Neurosurgery;  Laterality: Right;   IR CT HEAD LTD  09/12/2021   IR PERCUTANEOUS ART THROMBECTOMY/INFUSION INTRACRANIAL INC DIAG ANGIO  09/12/2021   IR US GUIDE VASC ACCESS RIGHT  09/12/2021   LAPAROSCOPIC APPENDECTOMY  10/24/2013   Procedure: APPENDECTOMY LAPAROSCOPIC;  Surgeon: Shann Medal, MD;  Location: [facility];  Service: General;;   RADIOLOGY WITH ANESTHESIA N/A 09/12/2021   Procedure: IR WITH ANESTHESIA;  Surgeon: Luanne Bras, MD;  Location: [place];   Service: Radiology;  Laterality: N/A;   TEE WITHOUT CARDIOVERSION N/A 10/02/2021   Procedure: TRANSESOPHAGEAL ECHOCARDIOGRAM (TEE);  Surgeon: Fay Records, MD;  Location: [place];  Service: Cardiovascular;  Laterality: N/A;   Patient Active Problem List   Diagnosis Date Noted   Mild episode of recurrent major depressive disorder ([place]) 06/15/2022   Type 2 diabetes mellitus with hyperglycemia, with long-term current use of insulin ([place])    Debility 02/14/2022   Status post craniectomy 02/09/2022   Memory deficit following cerebral infarction 02/07/2022   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 ([place]) 01/19/2022   Pneumonia of both lower lobes due to methicillin resistant Staphylococcus aureus (MRSA) ([place])    Cerebral embolism with cerebral infarction 09/20/2021   Hypernatremia 09/19/2021   Urinary retention 09/18/2021   Fever 09/18/2021   Acute respiratory failure with hypoxia ([place])    Cerebral edema ([place]) 09/15/2021   Acute ischemic stroke ([place]) 09/12/2021   Dysphagia following cerebral infarction 05/25/2021   Vitamin D deficiency 11/04/2020   Hypertensive urgency 10/31/2020   Hyperlipidemia 03/28/2018   Diabetes mellitus without complication ([place]) [id number]   Essential hypertension 02/23/2018    ONSET DATE: 08/2021  REFERRING DIAG:  Diagnosis  I63.9 (ICD-10-CM) - Cerebral infarction, unspecified  R53.81 (ICD-10-CM) - Other malaise    THERAPY DIAG:  Muscle weakness (generalized)  Abnormality  of gait and mobility  Difficulty in walking, not elsewhere classified  Unsteadiness on feet  Other lack of coordination  Rationale for Evaluation and Treatment: Rehabilitation  SUBJECTIVE:                                                                                                                                                                                             SUBJECTIVE STATEMENT: Patient reports continuing to feel better and states no new  symptoms  Pt accompanied by: significant other- Wife  PERTINENT HISTORY: Patient is a 47 year old male with recent history of CVA  in April 2023 and then elective cranioplasty in September 2023 with subsequent inpatient rehab and then home health services. Patient has left hemiparesis.   PAIN:  Are you having pain? No  PRECAUTIONS: Fall  WEIGHT BEARING RESTRICTIONS: No  FALLS: Has patient fallen in last 6 months? Yes. 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   RADIATION DOSE REDUCTION: This exam was performed according to the departmental dose-optimization program which includes automated exposure control, adjustment of the mA and/or kV according to patient size and/or use of iterative reconstruction technique.   COMPARISON:  09/19/2021   FINDINGS: Brain: Diffuse cytotoxic edema is noted throughout the distribution of the right MCA and PCA watershed region similar to that seen on the prior exam. No parenchymal hemorrhage is seen. No new focal infarct is seen.   Vascular: No hyperdense vessel or unexpected calcification.   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   Sinuses/Orbits: Paranasal sinuses are within normal limits.   Other: Gastric catheter and feeding catheter are noted extending through the nose.   IMPRESSION: Right MCA/PCA infarct stable in appearance from the previous exam. No acute hemorrhage is noted.  Right-sided hemi craniectomy stable from the prior exam. No new focal abnormality is noted.     Electronically Signed   By: Inez Catalina M.D.   On: 09/24/2021 02:47  COGNITION: Overall cognitive status: Impaired-decreased awareness of functional limitations   SENSATION: Light touch: Impaired - decreased  COORDINATION:  Left side hemiparesis  EDEMA:  None observed  MUSCLE TONE: LLE: Hypotonic   POSTURE: rounded shoulders and forward head  LOWER EXTREMITY ROM:     Active  Right Eval Left Eval  Hip flexion    Hip extension    Hip abduction    Hip adduction    Hip internal rotation    Hip external rotation    Knee flexion    Knee extension    Ankle dorsiflexion    Ankle plantarflexion    Ankle inversion    Ankle eversion     (Blank rows = not tested)  LOWER EXTREMITY MMT:    MMT Right Eval Left Eval  Hip flexion 5 2-  Hip extension 5 2-  Hip abduction 5 2-  Hip adduction 5 2-  Hip internal rotation 5 2-  Hip external rotation 5 2-  Knee flexion 5 2-  Knee extension 5 2-  Ankle dorsiflexion 5  0   Ankle plantarflexion    Ankle inversion    Ankle eversion    (Blank rows = not tested)  BED MOBILITY:  Not tested  TRANSFERS: Assistive device utilized: Hemi walker  Sit to stand: Mod A Stand to sit: Mod A Chair to chair: Mod A Floor:  Not tested   STAIRS: *Patient adamantly wanted to attempt today Level of Assistance: Max A Stair Negotiation Technique: Step to Pattern with Bilateral Rails Number of Stairs: 1  Height of Stairs: 6"  Comments: Patient wanted to attempt steps continuously requesting to try- He was able to  stand from w/c with mod assist using right arm for support on armrest of chair with gait belt. Once in standing he was able to step up with right LE with physical assist to block his left LE yet able to flex left hip/knee enough to clear 1st step- positioned back in chair.   GAIT: Gait pattern: step to pattern Distance walked: 8 feet Assistive device utilized: Hemi walker Level of assistance: Mod A Comments: Patient required VC for seqencing and use of left AFO today. - fatigues very quickly and left knee buckling with weight bearing.   FUNCTIONAL TESTS:  5 times sit to stand: Patient required mod Assist to stand and unable to complete on his own.  PATIENT SURVEYS:  FOTO 36; predicted goal= 47  TODAY'S TREATMENT:                                                                                                                              DATE: 07/29/22     Therex:   Seated LAQ and march 2 sets of 10 reps (AAROM on the L  and AROM on R)  Seated marching 2 x 10 AAROM L only  Seated HS curl isometric against resistance band   Therapeutic activities:   Donned L LE AFO- Difficulty with straps today- so doffed and patient then peformed the following in // bars with CGA, Gait belt  STS transfer performs with CGA and significant UE assist- Min VC to push up from armrest vs. Reaching for bar.   Lateral  weight shift to the affected left side x several minutes, requests rest due to fatigue. Utilized Geologist, engineering for feedback - instructing patient to look up into mirror and correct his posture as he naturally leans to stronger right side.   Ambulation in // bars - down and back x 1 (seated rest after walking down) - using mirror as guide to remind patient to look ahead and weight shift to left LE- Able to accept min weight on left LE - knee remained flexed but did not really buckle and patient was able to clear his foot by using more steppage gait.     PATIENT EDUCATION: Education details: PT  plan of care and HEP Person educated: Patient and Spouse Education method: Explanation, Demonstration, Tactile cues, Verbal cues, and Handouts Education comprehension: verbalized understanding, returned demonstration, verbal cues required, tactile cues required, and needs further education  HOME EXERCISE PROGRAM: Access Code: [email or web address] URL: [email or web address] Date: 06/23/2022 Prepared by: Sande Brothers  Exercises - Supine Quad Set  - 1 x daily - 7 x weekly - 3 sets - 10 reps - Seated Long Arc Quad  - 1 x daily - 7 x weekly - 3 sets - 10 reps - Seated March  - 1 x daily - 7 x weekly - 3 sets - 10 reps - Supine Bridge  - 1 x daily - 7 x weekly - 3 sets - 10 reps - Sit to Stand with Counter Support  - 1 x daily - 7 x weekly - 3 sets - 10 reps  GOALS: Goals reviewed with patient? Yes  SHORT TERM GOALS: Target date: 08/04/2022  Pt will be independent with HEP in order to improve strength and balance in order to decrease fall risk and improve function at home and work.  Baseline: EVAL-Patient with no formal HEP in place Goal status: INITIAL  2.  Patient will perform all stand pivot transfers with min Assist using hemi-walker for improved functional independence with mobility in home.  Baseline: EVAL= Mod A with transfers Goal status: INITIAL    LONG TERM GOALS: Target date: 09/15/2022  Pt will improve FOTO to target score of 47  to display perceived improvements in ability to complete ADL's.  Baseline: EVAL= 36 Goal status: INITIAL  2.  Pt will achieve 5x STS in less than 60 seconds in order to demonstrate clinically significant improvement in LE strength. Baseline: EVAL- Mod A with sit to stand transfer Goal status: INITIAL INITIAL 3.  Patient will ambulate > 50 feet with hemiwalker using step to gait and CGA for improved household mobility.  Baseline: EVAL- 8 feet with hemiwalker and mod A Goal status: INITIAL  4.  Patient will perform all transfers with  CGA  including w/c to various surfaces- toilet, bed, chairs using hemiwalker for improved functional independence with mobility.  Baseline:  EVAL- Mod asst with all transfers Goal status: INITIAL   ASSESSMENT:  CLINICAL IMPRESSION: Patient continues to present with some level of fatigue secondary to recent flu illness. Difficulty donning AFO with some strap issues today but patient was  able to stand and focus on more weight shifting and later some ambulation. He continues to present with significant left sided weakness and may require more adaptive equipment for safe mobility if this does not improve. He was less impulsive today and was able to stand well and turn in // bars and sit safely today with minimal VC.  Pt will continue to benefit from skilled physical therapy intervention to address impairments, improve QOL, and attain therapy goals.       OBJECTIVE IMPAIRMENTS: Abnormal gait, cardiopulmonary status limiting activity, decreased activity tolerance, decreased balance, decreased cognition, decreased coordination, decreased endurance, decreased mobility, difficulty walking, decreased strength, decreased safety awareness, impaired perceived functional ability, impaired sensation, and impaired UE functional use.   ACTIVITY LIMITATIONS: carrying, lifting, bending, standing, squatting, stairs, transfers, and bed mobility  PARTICIPATION LIMITATIONS: meal prep, cleaning, laundry, driving, shopping, community activity, occupation, and yard work  PERSONAL FACTORS: Time since onset of injury/illness/exacerbation and 1-2 comorbidities: HTN, DM  are also affecting patient's functional outcome.   REHAB POTENTIAL: Good  CLINICAL DECISION MAKING: Evolving/moderate complexity  EVALUATION COMPLEXITY: Moderate  PLAN:  PT FREQUENCY: 1-2x/week  PT DURATION: 12 weeks  PLANNED INTERVENTIONS: Therapeutic exercises, Therapeutic activity, Neuromuscular re-education, Balance training, Gait training,  Patient/Family education, Self Care, Joint mobilization, Stair training, Orthotic/Fit training, DME instructions, Dry Needling, Electrical stimulation, Wheelchair mobility training, Spinal mobilization, Cryotherapy, Moist heat, Splintting, Taping, and Manual therapy  PLAN FOR NEXT SESSION: Review and progress LE strengthening. Transfer training for safe mobility, Gait training in // bars or with hemiwalker and close follow with w/c. Add to HEP as appropriate.    Lewis Moccasin, PT 07/29/2022, 5:33 PM

## 2022-07-30 ENCOUNTER — Encounter: Payer: Medicaid Other | Admitting: Occupational Therapy

## 2022-07-30 ENCOUNTER — Ambulatory Visit: Payer: Medicaid Other | Admitting: Physical Therapy

## 2022-08-02 NOTE — Therapy (Signed)
OUTPATIENT OCCUPATIONAL THERAPY NEURO TREATMENT  Patient Name: Jacob [name] MRN: [name] DOB:02-Nov-1975, 47 y.o., male Today's Date: 08/02/2022  PCP: Dr. Townsend Roger REFERRING PROVIDER: Dr. Vassie Loll Eyk  END OF SESSION:  OT End of Session - 08/02/22 2134     Visit Number 6    Number of Visits 24    Date for OT Re-Evaluation 09/10/22    Progress Note Due on Visit 10    OT Start Time 0931    OT Stop Time 1015    OT Time Calculation (min) 44 min    Equipment Utilized During Treatment wc    Activity Tolerance Patient tolerated treatment well    Behavior During Therapy [facility] for tasks assessed/performed             Past Medical History:  Diagnosis Date   Allergy    Diabetes mellitus without complication ([place])    GERD (gastroesophageal reflux disease)    Hypertension    Paralysis ([place])    Stroke [facility])    Past Surgical History:  Procedure Laterality Date   APPENDECTOMY     BUBBLE STUDY  10/02/2021   Procedure: BUBBLE STUDY;  Surgeon: Fay Records, MD;  Location: [place];  Service: Cardiovascular;;   CRANIOPLASTY Right 02/09/2022   Procedure: Craniectomy with Replacement of Bone Flap From the Abdomen;  Surgeon: Newman Pies, MD;  Location: [place];  Service: Neurosurgery;  Laterality: Right;   CRANIOTOMY Right 09/15/2021   Procedure: RIGHT DECOMPRESSIVE CRANIOTOMY, PLACEMENT OF BONE FLAP IN ABDOMEN;  Surgeon: Newman Pies, MD;  Location: [place];  Service: Neurosurgery;  Laterality: Right;   IR CT HEAD LTD  09/12/2021   IR PERCUTANEOUS ART THROMBECTOMY/INFUSION INTRACRANIAL INC DIAG ANGIO  09/12/2021   IR US GUIDE VASC ACCESS RIGHT  09/12/2021   LAPAROSCOPIC APPENDECTOMY  10/24/2013   Procedure: APPENDECTOMY LAPAROSCOPIC;  Surgeon: Shann Medal, MD;  Location: [facility];  Service: General;;   RADIOLOGY WITH ANESTHESIA N/A 09/12/2021   Procedure: IR WITH ANESTHESIA;  Surgeon: Luanne Bras, MD;  Location: [employer];  Service: Radiology;  Laterality: N/A;   TEE  WITHOUT CARDIOVERSION N/A 10/02/2021   Procedure: TRANSESOPHAGEAL ECHOCARDIOGRAM (TEE);  Surgeon: Fay Records, MD;  Location: [place];  Service: Cardiovascular;  Laterality: N/A;   Patient Active Problem List   Diagnosis Date Noted   Mild episode of recurrent major depressive disorder ([place]) 06/15/2022   Type 2 diabetes mellitus with hyperglycemia, with long-term current use of insulin ([place])    Debility 02/14/2022   Status post craniectomy 02/09/2022   Memory deficit following cerebral infarction 02/07/2022   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 ([place]) 01/19/2022   Pneumonia of both lower lobes due to methicillin resistant Staphylococcus aureus (MRSA) ([place])    Cerebral embolism with cerebral infarction 09/20/2021   Hypernatremia 09/19/2021   Urinary retention 09/18/2021   Fever 09/18/2021   Acute respiratory failure with hypoxia ([facility])    Cerebral edema ([place]) 09/15/2021   Acute ischemic stroke ([place]) 09/12/2021   Dysphagia following cerebral infarction 05/25/2021   Vitamin D deficiency 11/04/2020   Hypertensive urgency 10/31/2020   Hyperlipidemia 03/28/2018   Diabetes mellitus without complication ([place]) [id number]   Essential hypertension 02/23/2018    ONSET DATE: April 2023  REFERRING DIAG: I63.9 (ICD-10-CM) - Cerebral infarction, unspecified   THERAPY DIAG:  Muscle weakness (generalized)  Other lack of coordination  Rationale for Evaluation and Treatment: Rehabilitation  SUBJECTIVE:  SUBJECTIVE STATEMENT: Pt reports feeling tired this morning. Pt  accompanied by: self and significant other  PERTINENT HISTORY: Per chart, Jacob [name] is a 47 y.o. male with a history of prior CVA with decompressive craniectomy in April 2023.  He has a history of right peroneal DVT on Eliquis.  He has left hemiparesis from stroke.   Per spouse, pt spent some time in [place], then 2 months at [employer] [facility] (SNF). He presented for elective  cranioplasty in Sept 2023.  Pt was admitted to rehab 02/14/2022 for inpatient therapies following cranioplasty, and participated in [facility] therapies until recently.      PRECAUTIONS: Fall  WEIGHT BEARING RESTRICTIONS: No  PAIN:  Are you having pain? Yes: NPRS scale: 2 (rest), (activity) 5/10 Pain location: L shoulder sublux Pain description: achy Aggravating factors: movement of the L shoulder  Relieving factors: lying down and gentle stretching   FALLS: Has patient fallen in last 6 months? Yes. Number of falls 1  LIVING ENVIRONMENT: Lives with: lives with their spouse and 36 y/o son, 1 cousin, and 1 friend Lives in: 2 level with master bedroom on the main level, pt resides only on 1st level Stairs: 1 step no rail to enter, pt does have small ramp Has following equipment at home: Hemi walker, Wheelchair (manual), Shower bench, bed side commode, Grab bars, Ramped entry, and platform walker (grab bar by the toilet but not in the shower.  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  PATIENT GOALS: Pt wants to get back to walking and being as indep as possible.   OBJECTIVE:   HAND DOMINANCE: Right  ADLs: Overall ADLs: HHA Mon-Fri 5 hours/day; spouse is pt's primary caregiver Transfers/ambulation related to ADLs: min A sit to stand pivot from wc using gait belt  Eating: assist to cut food  Grooming: min A to manage facial hair UB Dressing: max A LB Dressing: max-dep A Toileting: dep, wears adult briefs.  Spouse reports pt has just started transitioning to use of BSC over the toilet vs voiding in a brief Bathing: max A with sponge/bed bath (in the process of getting bathroom re-done to enable pt to take a shower) Tub Shower transfers: N/A at this time; pt states he doesn't trust himself with the glass doors. Equipment:  see above  IADLs: Shopping: dep Light housekeeping: dep Meal Prep: dep Community mobility: wc dep   Medication management: dep Financial management: dep Handwriting:  Pt is R hand dominant, NT  MOBILITY STATUS:  Pt reports he can amb with hemi-walker 10-20 ft with max A (per spouse) and someone following with a wc behind him  POSTURE COMMENTS:  rounded shoulders, forward head, and L sided hemiplegia (L shoulder subluxation ~1 thumb breadth) Sitting balance:  able to sit edge of mat and tolerate moderate perturbation from all directions.  Pt can reach forward to ankles and return to midline without LOB.  FUNCTIONAL OUTCOME MEASURES: FOTO: 20; predicted 29  UPPER EXTREMITY ROM:    Passive ROM Right Eval (A/PROMWNL) Left eval  Shoulder flexion  78  Shoulder abduction  60  Shoulder adduction    Shoulder extension    Shoulder internal rotation    Shoulder external rotation  -10 (elbow at side)  Elbow flexion    Elbow extension    Wrist flexion  70  Wrist extension  48  Wrist ulnar deviation    Wrist radial deviation    Wrist pronation  90  Wrist supination  60  (Blank  rows = not tested)  L hand passively opens ~75% of full extension d/t PIP and MP tightness in all digits.  UPPER EXTREMITY MMT:     MMT Right eval Left eval  Shoulder flexion 5 0  Shoulder abduction 5 0  Shoulder adduction    Shoulder extension    Shoulder internal rotation    Shoulder external rotation    Middle trapezius    Lower trapezius    Elbow flexion 5 0  Elbow extension 5 0  Wrist flexion 5 0  Wrist extension 5 0  Wrist ulnar deviation    Wrist radial deviation    Wrist pronation    Wrist supination    (Blank rows = not tested)  HAND FUNCTION: Grip strength: Right: 95 lbs; Left: NT lbs, Lateral pinch: Right: 25 lbs, Left: NT lbs, and 3 point pinch: Right: 20 lbs, Left: NT lbs  COORDINATION: 9 Hole Peg test: Right: NT sec; Left: unable sec  SENSATION: LUE: Light touch: Impaired  Proprioception: Impaired   EDEMA: None  MUSCLE TONE: LUE: Severe and Hypertonic sublux 1 thumb  breadth  COGNITION: Overall cognitive status:  Decreased awareness of deficits  VISION: Subjective report: Pt reports decreased peripheral vision on the L Baseline vision: No visual deficits Visual history: N/A  VISION ASSESSMENT: Tracking/Visual pursuits: Decreased smoothness of eye movement to Left superior field, Decreased smoothness of eye movement to Left inferior field, and Requires cues, head turns, or add eye shifts to track Saccades: additional eye shifts occurred during testing and additional head turns occurred during testing Visual Fields: Left homonymous hemianopsia  PERCEPTION: Not tested, will assess in functional contexts with additional visits  PRAXIS: Impaired: Motor planning  OBSERVATIONS: L hand rests in flexion.  Noted slight odor originating from palm/between fingers.  Skin on volar hand appears to have some yeast.  Encouraged pt/spouse bring hand splint to all sessions to work towards opening up hand for improving skin integrity.  Spouse will bring next week.  TODAY'S TREATMENT:       Moist heat placed on L shoulder during stretches for pain management/muscle relaxation.                                                 Self Care: Reviewed positioning strategies in wc for promoting posture and comfort.  Encouraged spouse continue to pursue wc tray to support LUE.  In the meantime, encouraged pt have pillow beneath LUE to support L shoulder sublux, and another at the lumbar spine to promote more erect sitting posture while sitting in the wc.  Encouraged use of pillows in this manner while seated elsewhere in the home.  Reinforced importance of wearing resting hand splint on/off throughout the day.  Pt reports he's been inconsistent.  Reviewed benefits of brace for reducing joint contractures and helping to keep fingers from palm for avoiding skin breakdown and fungal growth, as well as making it easier for pt and caregivers to perform hand hygiene.  Therapeutic Exercise:  Performed passive L scapular retraction and depression.  Performed prolonged passive stretch for digit ext, isolating 1 digit at a time to maximize stretch at the MP, PIP, and DIP joints of all digits.  Performed passive wrist flex/ext, forearm pron/sup, elbow flex/ext, and L shoulder flex/abd 0-70, IR/ER (ER to -30* with elbow at side), working to reduce pain, tone, muscle stiffness, and reduce contracture risk throughout the LUE.  Fair tolerance, frequent rest breaks needed.  Performed active assisted bilat shoulder shrugs and retraction x2 sets 10 reps each.  Encouraged pt work on daily shrugs and retraction to decrease L shoulder sublux.  Manual scapular gliding all planes to promote L shoulder mobility.  Reviewed self PROM for L hand, wrist, forearm, elbow.  Reviewed L shoulder PROM with spouse as this is more difficult for pt to perform.  Will need to issue handouts.  PATIENT EDUCATION: Education details: benefits of consistent use of L resting hand splint to prevent contractures, wc positioning Person educated: Patient and Spouse Education method: Explanation and Verbal cues Education comprehension: verbalized understanding and needs further education  HOME EXERCISE PROGRAM: PROM throughout the LUE  GOALS: Goals reviewed with patient? Yes  SHORT TERM GOALS: Target date: 07/30/22 (6 weeks)  Pt/caregivers will demo indep with HEP for reducing pain and stiffness throughout the LUE. Baseline: Not yet initiated Goal status: INITIAL  2.  Pt/caregivers will demo supportive positioning techniques with min vc to support L hemiparetic limb. Baseline: Educ not yet initiated Goal status: INITIAL  3.  Pt/caregivers will follow L hand splinting schedule with at last 50% compliance in order to reduce contracture risk and to optimize skin integrity of L hand. Baseline: Not yet initiated.   Goal status: INITIAL   LONG TERM  GOALS: Target date: 09/10/22 (12 weeks)  Pt will increase FOTO score to 29 or more to indicate increased indep with daily tasks.   Baseline: Eval: 20 (predicted 29) Goal status: INITIAL  2.  Pt will transfer wc<>BSC with min guard Baseline: min-mod A Goal status: INITIAL  3.  Pt will don/doff shirt with min A Baseline: Mod-max A Goal status: INITIAL  4.  Pt will manage clothing for toileting with min A Baseline: max A-dep Goal status: INITIAL  5.  Pt will increase L shoulder strength by 1 MM grade to assist with repositioning L upper arm away from side for underarm care and donning shirt. Baseline: L shoulder 0/5 Goal status: INITIAL  6.  Through manual therapy, neuro re-ed, and therapeutic exercises, pt will report 3/10 pain or less throughout the LUE to increase tolerance for engaging the LUE into ADLs.   Baseline: LUE 5/10 pain Goal status: INITIAL  ASSESSMENT:  CLINICAL IMPRESSION: Spouse present today which enabled review of wc positioning to optimize erect sitting posture and support throughout the LUE.  Spouse continues to make phone calls to pursue wc lap tray, and in the meantime OT encouraged pt use a pillow in his lap to support the L arm and a pillow behind his back to support the lumbar spine.  Reviewed self and caregiver assisted PROM throughout the LUE.  Will need to issued handouts, but time constraints prevented this today.  Pt is not currently wearing his resting hand splint on recommended daily schedule.  Reviewed importance of following consistent wearing schedule, see note for details.  Pt/spouse in agreement.  Spouse plans to make a written schedule for pt and caregivers so that pt and caregivers can be more proactive with  completion of his exercises and following a splinting schedule.  Pt continues to report tiredness during OT sessions and requires intermittent tactile and vc for attention to task as he often rests with head to his hand, eyes closed.  Spouse  reports that she is trying to get pt set up with later morning appointments, as pt tends to take morning naps, but that afternoon appointments are not feasible d/t their caregiver schedules at home.  Pt will continue to benefit from skilled OT to work towards optimal positioning of the LUE for contracture prevention, maximize ROM throughout the LUE for ADLs, decrease pain, maximize engagement of the LUE into daily tasks, as well work to maximize level of indep with daily tasks.   PERFORMANCE DEFICITS: in functional skills including ADLs, IADLs, coordination, dexterity, proprioception, sensation, tone, ROM, strength, pain, flexibility, Fine motor control, Gross motor control, mobility, balance, body mechanics, decreased knowledge of use of DME, skin integrity, vision, and UE functional use, cognitive skills including perception and safety awareness, and psychosocial skills including coping strategies and routines and behaviors.   IMPAIRMENTS: are limiting patient from ADLs, IADLs, leisure, and social participation.   CO-MORBIDITIES: may have co-morbidities  that affects occupational performance. Patient will benefit from skilled OT to address above impairments and improve overall function.  MODIFICATION OR ASSISTANCE TO COMPLETE EVALUATION: No modification of tasks or assist necessary to complete an evaluation.  OT OCCUPATIONAL PROFILE AND HISTORY: Problem focused assessment: Including review of records relating to presenting problem.  CLINICAL DECISION MAKING: Moderate - several treatment options, min-mod task modification necessary  REHAB POTENTIAL: Good  EVALUATION COMPLEXITY: High    PLAN:  OT FREQUENCY: 2x/week  OT DURATION: 12 weeks  PLANNED INTERVENTIONS: self care/ADL training, therapeutic exercise, therapeutic activity, neuromuscular re-education, manual therapy, passive range of motion, balance training, functional mobility training, splinting, electrical stimulation, moist heat,  cryotherapy, patient/family education, cognitive remediation/compensation, visual/perceptual remediation/compensation, psychosocial skills training, coping strategies training, and DME and/or AE instructions  RECOMMENDED OTHER SERVICES: N/A  CONSULTED AND AGREED WITH PLAN OF CARE: Patient and family member/caregiver  PLAN FOR NEXT SESSION: see plan  Leta Speller, MS, OTR/L  Darleene Cleaver, OT 08/02/2022, 9:35 PM

## 2022-08-03 ENCOUNTER — Ambulatory Visit: Payer: Medicaid Other | Admitting: Physical Therapy

## 2022-08-04 ENCOUNTER — Ambulatory Visit: Payer: Medicaid Other

## 2022-08-05 ENCOUNTER — Ambulatory Visit: Payer: Medicaid Other

## 2022-08-05 DIAGNOSIS — M6281 Muscle weakness (generalized): Secondary | ICD-10-CM

## 2022-08-05 DIAGNOSIS — R278 Other lack of coordination: Secondary | ICD-10-CM

## 2022-08-05 DIAGNOSIS — R2681 Unsteadiness on feet: Secondary | ICD-10-CM

## 2022-08-05 DIAGNOSIS — R262 Difficulty in walking, not elsewhere classified: Secondary | ICD-10-CM

## 2022-08-05 DIAGNOSIS — R269 Unspecified abnormalities of gait and mobility: Secondary | ICD-10-CM

## 2022-08-05 NOTE — Therapy (Signed)
OUTPATIENT OCCUPATIONAL THERAPY NEURO TREATMENT  Patient Name: Jacob [name] MRN: [id number] DOB:10-01-1975, 47 y.o., male Today's Date: 08/05/2022  PCP: Dr. Townsend Roger REFERRING PROVIDER: Dr. Vassie Loll Eyk  END OF SESSION:  OT End of Session - 08/05/22 1525     Visit Number 7    Number of Visits 24    Date for OT Re-Evaluation 09/10/22    Progress Note Due on Visit 10    OT Start Time 1022    OT Stop Time 1100    OT Time Calculation (min) 38 min    Equipment Utilized During Treatment wc    Activity Tolerance Patient tolerated treatment well    Behavior During Therapy WFL for tasks assessed/performed             Past Medical History:  Diagnosis Date   Allergy    Diabetes mellitus without complication ([place])    GERD (gastroesophageal reflux disease)    Hypertension    Paralysis ([place])    Stroke [facility])    Past Surgical History:  Procedure Laterality Date   APPENDECTOMY     BUBBLE STUDY  10/02/2021   Procedure: BUBBLE STUDY;  Surgeon: Fay Records, MD;  Location: [place];  Service: Cardiovascular;;   CRANIOPLASTY Right 02/09/2022   Procedure: Craniectomy with Replacement of Bone Flap From the Abdomen;  Surgeon: Newman Pies, MD;  Location: [place];  Service: Neurosurgery;  Laterality: Right;   CRANIOTOMY Right 09/15/2021   Procedure: RIGHT DECOMPRESSIVE CRANIOTOMY, PLACEMENT OF BONE FLAP IN ABDOMEN;  Surgeon: Newman Pies, MD;  Location: [place];  Service: Neurosurgery;  Laterality: Right;   IR CT HEAD LTD  09/12/2021   IR PERCUTANEOUS ART THROMBECTOMY/INFUSION INTRACRANIAL INC DIAG ANGIO  09/12/2021   IR US GUIDE VASC ACCESS RIGHT  09/12/2021   LAPAROSCOPIC APPENDECTOMY  10/24/2013   Procedure: APPENDECTOMY LAPAROSCOPIC;  Surgeon: Shann Medal, MD;  Location: [facility];  Service: General;;   RADIOLOGY WITH ANESTHESIA N/A 09/12/2021   Procedure: IR WITH ANESTHESIA;  Surgeon: Luanne Bras, MD;  Location: [place];  Service: Radiology;  Laterality: N/A;   TEE  WITHOUT CARDIOVERSION N/A 10/02/2021   Procedure: TRANSESOPHAGEAL ECHOCARDIOGRAM (TEE);  Surgeon: Fay Records, MD;  Location: [place];  Service: Cardiovascular;  Laterality: N/A;   Patient Active Problem List   Diagnosis Date Noted   Mild episode of recurrent major depressive disorder ([place]) 06/15/2022   Type 2 diabetes mellitus with hyperglycemia, with long-term current use of insulin ([place])    Debility 02/14/2022   Status post craniectomy 02/09/2022   Memory deficit following cerebral infarction 02/07/2022   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 ([place]) 01/19/2022   Pneumonia of both lower lobes due to methicillin resistant Staphylococcus aureus (MRSA) ([place])    Cerebral embolism with cerebral infarction 09/20/2021   Hypernatremia 09/19/2021   Urinary retention 09/18/2021   Fever 09/18/2021   Acute respiratory failure with hypoxia ([facility])    Cerebral edema ([place]) 09/15/2021   Acute ischemic stroke ([place]) 09/12/2021   Dysphagia following cerebral infarction 05/25/2021   Vitamin D deficiency 11/04/2020   Hypertensive urgency 10/31/2020   Hyperlipidemia 03/28/2018   Diabetes mellitus without complication ([place]) [id number]   Essential hypertension 02/23/2018    ONSET DATE: April 2023  REFERRING DIAG: I63.9 (ICD-10-CM) - Cerebral infarction, unspecified   THERAPY DIAG:  Muscle weakness (generalized)  Other lack of coordination  Rationale for Evaluation and Treatment: Rehabilitation  SUBJECTIVE:  SUBJECTIVE STATEMENT: Pt more alert, talkative, and engaged in  tx this morning.  Spouse reported that she reduced pt's Trazodone dose last night to see if it would help with his drowsiness in the morning.  Pt acknowledged that he still slept well and feels good today. Pt accompanied by: self and significant other  PERTINENT HISTORY: Per chart, Jacob [name] is a 47 y.o. male with a history of prior CVA with decompressive craniectomy in April 2023.  He has a history  of right peroneal DVT on Eliquis.  He has left hemiparesis from stroke.   Per spouse, pt spent some time in [place], then 2 months at [facility] (SNF). He presented for elective cranioplasty in Sept 2023.  Pt was admitted to rehab 02/14/2022 for inpatient therapies following cranioplasty, and participated in [facility] therapies until recently.      PRECAUTIONS: Fall  WEIGHT BEARING RESTRICTIONS: No  PAIN:  Are you having pain? Yes: NPRS scale: 2 (rest), (activity) 5/10 Pain location: L shoulder sublux Pain description: achy Aggravating factors: movement of the L shoulder  Relieving factors: lying down and gentle stretching   FALLS: Has patient fallen in last 6 months? Yes. Number of falls 1  LIVING ENVIRONMENT: Lives with: lives with their spouse and 66 y/o son, 1 cousin, and 1 friend Lives in: 2 level with master bedroom on the main level, pt resides only on 1st level Stairs: 1 step no rail to enter, pt does have small ramp Has following equipment at home: Hemi walker, Wheelchair (manual), Shower bench, bed side commode, Grab bars, Ramped entry, and platform walker (grab bar by the toilet but not in the shower.  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  PATIENT GOALS: Pt wants to get back to walking and being as indep as possible.   OBJECTIVE:   HAND DOMINANCE: Right  ADLs: Overall ADLs: HHA Mon-Fri 5 hours/day; spouse is pt's primary caregiver Transfers/ambulation related to ADLs: min A sit to stand pivot from wc using gait belt  Eating: assist to cut food  Grooming: min A to manage facial hair UB Dressing: max A LB Dressing: max-dep A Toileting: dep, wears adult briefs.  Spouse reports pt has just started transitioning to use of BSC over the toilet vs voiding in a brief Bathing: max A with sponge/bed bath (in the process of getting bathroom re-done to enable pt to take a shower) Tub Shower transfers:  N/A at this time; pt states he doesn't trust himself with the glass doors. Equipment:  see above  IADLs: Shopping: dep Light housekeeping: dep Meal Prep: dep Community mobility: wc dep  Medication management: dep Financial management: dep Handwriting:  Pt is R hand dominant, NT  MOBILITY STATUS:  Pt reports he can amb with hemi-walker 10-20 ft with max A (per spouse) and someone following with a wc behind him  POSTURE COMMENTS:  rounded shoulders, forward head, and L sided hemiplegia (L shoulder subluxation ~1 thumb breadth) Sitting balance:  able to sit edge of mat and tolerate moderate perturbation from all directions.  Pt can reach forward to ankles and return to midline without LOB.  FUNCTIONAL OUTCOME MEASURES: FOTO: 20; predicted 29  UPPER EXTREMITY ROM:    Passive ROM Right Eval (A/PROMWNL) Left eval  Shoulder flexion  78  Shoulder abduction  60  Shoulder adduction    Shoulder extension    Shoulder internal rotation    Shoulder external rotation  -10 (elbow at side)  Elbow flexion  Elbow extension    Wrist flexion  70  Wrist extension  48  Wrist ulnar deviation    Wrist radial deviation    Wrist pronation  90  Wrist supination  60  (Blank rows = not tested)  L hand passively opens ~75% of full extension d/t PIP and MP tightness in all digits.  UPPER EXTREMITY MMT:     MMT Right eval Left eval  Shoulder flexion 5 0  Shoulder abduction 5 0  Shoulder adduction    Shoulder extension    Shoulder internal rotation    Shoulder external rotation    Middle trapezius    Lower trapezius    Elbow flexion 5 0  Elbow extension 5 0  Wrist flexion 5 0  Wrist extension 5 0  Wrist ulnar deviation    Wrist radial deviation    Wrist pronation    Wrist supination    (Blank rows = not tested)  HAND FUNCTION: Grip strength: Right: 95 lbs; Left: NT lbs, Lateral pinch: Right: 25 lbs, Left: NT lbs, and 3 point pinch: Right: 20 lbs, Left: NT lbs  COORDINATION: 9  Hole Peg test: Right: NT sec; Left: unable sec  SENSATION: LUE: Light touch: Impaired  Proprioception: Impaired   EDEMA: None  MUSCLE TONE: LUE: Severe and Hypertonic sublux 1 thumb breadth  COGNITION: Overall cognitive status:  Decreased awareness of deficits  VISION: Subjective report: Pt reports decreased peripheral vision on the L Baseline vision: No visual deficits Visual history: N/A  VISION ASSESSMENT: Tracking/Visual pursuits: Decreased smoothness of eye movement to Left superior field, Decreased smoothness of eye movement to Left inferior field, and Requires cues, head turns, or add eye shifts to track Saccades: additional eye shifts occurred during testing and additional head turns occurred during testing Visual Fields: Left homonymous hemianopsia  PERCEPTION: Not tested, will assess in functional contexts with additional visits  PRAXIS: Impaired: Motor planning  OBSERVATIONS: L hand rests in flexion.  Noted slight odor originating from palm/between fingers.  Skin on volar hand appears to have some yeast.  Encouraged pt/spouse bring hand splint to all sessions to work towards opening up hand for improving skin integrity.  Spouse will bring next week.  TODAY'S TREATMENT:            Self Care: Reviewed specific ADL goals with pt and spouse, working to confirm their priorities for OT sessions.  Spouse reports that pt would like to start working towards sleeping in his own bed rather than the hospital bed, but it's challenging to get pt in and out of the bed.  Pt also reports that he would like to be able to put his own pants on, including pulling them up.  OT provided positive feedback related to pt's goal ideas, both realistic to work towards.  OT provided education to pt and spouse regarding AE for bed transfers, including a bed rail or a bed cane, or combination of both for transitioning between sitting and supine.  OT advised on options to obtain; pt/spouse receptive to  both.    Therapeutic Exercise:  Instructed spouse and pt in PROM throughout the LUE, including all ranges for the L shoulder, elbow, forearm, wrist flex/ext, and digit flex/ext.  Educated pt/spouse on ranges for pt to attempt himself (table slides for L shoulder flex/abd) or supine shoulder flex, and spouse may also assist with flex or abd in sitting, reinforcing need to keep these movements shoulder level and below.  Instructed spouse in passive L shoulder ER  with elbow at side, and horiz abd/add with good return demo.  Encouraged pt to perform self ROM for the elbow, forearm, wrist, and hand.  Pt/spouse able to return demo of above with min vc/tactile cues after OT providing initial demo.  Handout issued for carryover.  Encouraged pt/spouse work ROM into ADLs as able.   PATIENT EDUCATION: Education details: LUE PROM Person educated: Patient and Spouse Education method: Explanation and Verbal cues, tactile cues, demo, handout Education comprehension: verbalized understanding and needs further education  HOME EXERCISE PROGRAM: PROM throughout the LUE  GOALS: Goals reviewed with patient? Yes  SHORT TERM GOALS: Target date: 07/30/22 (6 weeks)  Pt/caregivers will demo indep with HEP for reducing pain and stiffness throughout the LUE. Baseline: Not yet initiated Goal status: INITIAL  2.  Pt/caregivers will demo supportive positioning techniques with min vc to support L hemiparetic limb. Baseline: Educ not yet initiated Goal status: INITIAL  3.  Pt/caregivers will follow L hand splinting schedule with at last 50% compliance in order to reduce contracture risk and to optimize skin integrity of L hand. Baseline: Not yet initiated.   Goal status: INITIAL   LONG TERM GOALS: Target date: 09/10/22 (12 weeks)  Pt will increase FOTO score to 29 or more to indicate increased indep with daily tasks.   Baseline: Eval: 20 (predicted 29) Goal status: INITIAL  2.  Pt will transfer wc<>BSC with min  guard Baseline: min-mod A Goal status: INITIAL  3.  Pt will don/doff shirt with min A Baseline: Mod-max A Goal status: INITIAL  4.  Pt will manage clothing for toileting with min A Baseline: max A-dep Goal status: INITIAL  5.  Pt will increase L shoulder strength by 1 MM grade to assist with repositioning L upper arm away from side for underarm care and donning shirt. Baseline: L shoulder 0/5 Goal status: INITIAL  6.  Through manual therapy, neuro re-ed, and therapeutic exercises, pt will report 3/10 pain or less throughout the LUE to increase tolerance for engaging the LUE into ADLs.   Baseline: LUE 5/10 pain Goal status: INITIAL  7.  Pt will perform supine<>sit bed transfer with supv and AE as needed into his queen size bed.  Baseline: Mod A into hospital bed  Goal status: New  ASSESSMENT:  CLINICAL IMPRESSION: Pt more alert, talkative, and engaged in tx this morning.  Spouse reported that she reduced pt's Trazodone dose last night to see if it would help with his drowsiness in the morning.  Pt acknowledged that he still slept well and feels good today.  Pt arrived with pillow placed under L arm to support L shoulder sublux, as instructed last session.  Pt/spouse provided with positive feedback for their carryover of recommendations.  Provided instruction in PROM throughout the LUE today; spouse and pt returned demo with min vc/tactile cues and written handout for carryover.  Encouraged they work LUE PROM into ADLs.  Pt interested in adding a goal for bed mobility, as he would like to sleep in his own bed rather than the hospital bed.  OT provided education to pt/spouse on possibility of obtaining bed rail or bed cane to ease transfer at home.  OT encouraged spouse measure their bed at home so that OT can better simulate bed transfer in the clinic.  Bed transfer goal added below.  Pt also would like to work on donning his pants and hiking them more independently (goal in place currently  to address LB dressing).  Pt will continue  to benefit from skilled OT to work towards optimal positioning of the LUE for contracture prevention, maximize ROM throughout the LUE for ADLs, decrease pain, maximize engagement of the LUE into daily tasks, as well work to maximize level of indep with daily tasks.   PERFORMANCE DEFICITS: in functional skills including ADLs, IADLs, coordination, dexterity, proprioception, sensation, tone, ROM, strength, pain, flexibility, Fine motor control, Gross motor control, mobility, balance, body mechanics, decreased knowledge of use of DME, skin integrity, vision, and UE functional use, cognitive skills including perception and safety awareness, and psychosocial skills including coping strategies and routines and behaviors.   IMPAIRMENTS: are limiting patient from ADLs, IADLs, leisure, and social participation.   CO-MORBIDITIES: may have co-morbidities  that affects occupational performance. Patient will benefit from skilled OT to address above impairments and improve overall function.  MODIFICATION OR ASSISTANCE TO COMPLETE EVALUATION: No modification of tasks or assist necessary to complete an evaluation.  OT OCCUPATIONAL PROFILE AND HISTORY: Problem focused assessment: Including review of records relating to presenting problem.  CLINICAL DECISION MAKING: Moderate - several treatment options, min-mod task modification necessary  REHAB POTENTIAL: Good  EVALUATION COMPLEXITY: High    PLAN:  OT FREQUENCY: 2x/week  OT DURATION: 12 weeks  PLANNED INTERVENTIONS: self care/ADL training, therapeutic exercise, therapeutic activity, neuromuscular re-education, manual therapy, passive range of motion, balance training, functional mobility training, splinting, electrical stimulation, moist heat, cryotherapy, patient/family education, cognitive remediation/compensation, visual/perceptual remediation/compensation, psychosocial skills training, coping strategies  training, and DME and/or AE instructions  RECOMMENDED OTHER SERVICES: N/A  CONSULTED AND AGREED WITH PLAN OF CARE: Patient and family member/caregiver  PLAN FOR NEXT SESSION: see plan  Leta Speller, MS, OTR/L  Darleene Cleaver, OT 08/05/2022, 3:30 PM

## 2022-08-05 NOTE — Therapy (Signed)
OUTPATIENT PHYSICAL THERAPY NEURO TREATMENT   Patient Name: Jacob [name] MRN: [name] DOB:[birth year], 47 y.o., male Today's Date: 08/06/2022   PCP: Dr. Townsend Roger REFERRING PROVIDER: Dr. Vassie Loll Eyk  END OF SESSION:  PT End of Session - 08/05/22 1105     Visit Number 6    Number of Visits 24    Date for PT Re-Evaluation 09/15/22    Authorization Type [place]    Progress Note Due on Visit 10    PT Start Time 1103    PT Stop Time 1145    PT Time Calculation (min) 42 min    Equipment Utilized During Treatment Gait belt    Activity Tolerance Patient tolerated treatment well    Behavior During Therapy WFL for tasks assessed/performed                 Past Medical History:  Diagnosis Date   Allergy    Diabetes mellitus without complication ([place])    GERD (gastroesophageal reflux disease)    Hypertension    Paralysis ([place])    Stroke [facility])    Past Surgical History:  Procedure Laterality Date   APPENDECTOMY     BUBBLE STUDY  10/02/2021   Procedure: BUBBLE STUDY;  Surgeon: Fay Records, MD;  Location: [place];  Service: Cardiovascular;;   CRANIOPLASTY Right 02/09/2022   Procedure: Craniectomy with Replacement of Bone Flap From the Abdomen;  Surgeon: Newman Pies, MD;  Location: [place];  Service: Neurosurgery;  Laterality: Right;   CRANIOTOMY Right 09/15/2021   Procedure: RIGHT DECOMPRESSIVE CRANIOTOMY, PLACEMENT OF BONE FLAP IN ABDOMEN;  Surgeon: Newman Pies, MD;  Location: [place];  Service: Neurosurgery;  Laterality: Right;   IR CT HEAD LTD  09/12/2021   IR PERCUTANEOUS ART THROMBECTOMY/INFUSION INTRACRANIAL INC DIAG ANGIO  09/12/2021   IR US GUIDE VASC ACCESS RIGHT  09/12/2021   LAPAROSCOPIC APPENDECTOMY  10/24/2013   Procedure: APPENDECTOMY LAPAROSCOPIC;  Surgeon: Shann Medal, MD;  Location: [facility];  Service: General;;   RADIOLOGY WITH ANESTHESIA N/A 09/12/2021   Procedure: IR WITH ANESTHESIA;  Surgeon: Luanne Bras, MD;  Location: [place];   Service: Radiology;  Laterality: N/A;   TEE WITHOUT CARDIOVERSION N/A 10/02/2021   Procedure: TRANSESOPHAGEAL ECHOCARDIOGRAM (TEE);  Surgeon: Fay Records, MD;  Location: [place];  Service: Cardiovascular;  Laterality: N/A;   Patient Active Problem List   Diagnosis Date Noted   Mild episode of recurrent major depressive disorder ([place]) 06/15/2022   Type 2 diabetes mellitus with hyperglycemia, with long-term current use of insulin ([place])    Debility 02/14/2022   Status post craniectomy 02/09/2022   Memory deficit following cerebral infarction 02/07/2022   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 ([place]) 01/19/2022   Pneumonia of both lower lobes due to methicillin resistant Staphylococcus aureus (MRSA) ([place])    Cerebral embolism with cerebral infarction 09/20/2021   Hypernatremia 09/19/2021   Urinary retention 09/18/2021   Fever 09/18/2021   Acute respiratory failure with hypoxia ([place])    Cerebral edema ([place]) 09/15/2021   Acute ischemic stroke ([place]) 09/12/2021   Dysphagia following cerebral infarction 05/25/2021   Vitamin D deficiency 11/04/2020   Hypertensive urgency 10/31/2020   Hyperlipidemia 03/28/2018   Diabetes mellitus without complication ([place]) [id number]   Essential hypertension 02/23/2018    ONSET DATE: 08/2021  REFERRING DIAG:  Diagnosis  I63.9 (ICD-10-CM) - Cerebral infarction, unspecified  R53.81 (ICD-10-CM) - Other malaise    THERAPY DIAG:  Muscle weakness (generalized)  Other lack of coordination  Abnormality of gait and mobility  Difficulty in walking, not elsewhere classified  Unsteadiness on feet  Rationale for Evaluation and Treatment: Rehabilitation  SUBJECTIVE:                                                                                                                                                                                             SUBJECTIVE STATEMENT: Patient reports fatigued from OT session but no new  complaints.    Pt accompanied by: significant other- Wife  PERTINENT HISTORY: Patient is a 47 year old male with recent history of CVA  in April 2023 and then elective cranioplasty in September 2023 with subsequent inpatient rehab and then home health services. Patient has left hemiparesis.   PAIN:  Are you having pain? No  PRECAUTIONS: Fall  WEIGHT BEARING RESTRICTIONS: No  FALLS: Has patient fallen in last 6 months? Yes. 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   RADIATION DOSE REDUCTION: This exam was performed according to the departmental dose-optimization program which includes automated exposure control, adjustment of the mA and/or kV according to patient size and/or use of iterative reconstruction technique.   COMPARISON:  09/19/2021   FINDINGS: Brain: Diffuse cytotoxic edema is noted throughout the distribution of the right MCA and PCA watershed region similar to that seen on the prior exam. No parenchymal hemorrhage is seen. No new focal infarct is seen.   Vascular: No hyperdense vessel or unexpected calcification.   Skull: Large  calvarial defect there is noted on the right stable from the prior study.   Sinuses/Orbits: Paranasal sinuses are within normal limits.   Other: Gastric catheter and feeding catheter are noted extending through the nose.   IMPRESSION: Right MCA/PCA infarct stable in appearance from the previous exam. No acute hemorrhage is noted.  Right-sided hemi craniectomy stable from the prior exam. No new focal abnormality is noted.     Electronically Signed   By: Inez Catalina M.D.   On: 09/24/2021 02:47  COGNITION: Overall cognitive status: Impaired-decreased awareness of functional limitations   SENSATION: Light touch: Impaired - decreased  COORDINATION:  Left side hemiparesis  EDEMA:  None observed  MUSCLE TONE: LLE: Hypotonic   POSTURE: rounded shoulders and forward head  LOWER EXTREMITY ROM:     Active  Right Eval Left Eval  Hip flexion    Hip extension    Hip abduction    Hip adduction    Hip internal rotation    Hip external rotation    Knee flexion    Knee extension    Ankle dorsiflexion    Ankle plantarflexion    Ankle inversion    Ankle eversion     (Blank rows = not tested)  LOWER EXTREMITY MMT:    MMT Right Eval Left Eval  Hip flexion 5 2-  Hip extension 5 2-  Hip abduction 5 2-  Hip adduction 5 2-  Hip internal rotation 5 2-  Hip external rotation 5 2-  Knee flexion 5 2-  Knee extension 5 2-  Ankle dorsiflexion 5  0   Ankle plantarflexion    Ankle inversion    Ankle eversion    (Blank rows = not tested)  BED MOBILITY:  Not tested  TRANSFERS: Assistive device utilized: Hemi walker  Sit to stand: Mod A Stand to sit: Mod A Chair to chair: Mod A Floor:  Not tested   STAIRS: *Patient adamantly wanted to attempt today Level of Assistance: Max A Stair Negotiation Technique: Step to Pattern with Bilateral Rails Number of Stairs: 1  Height of Stairs: 6"  Comments: Patient wanted to attempt steps continuously requesting to try- He  was able to stand from w/c with mod assist using right arm for support on armrest of chair with gait belt. Once in standing he was able to step up with right LE with physical assist to block his left LE yet able to flex left hip/knee enough to clear 1st step- positioned back in chair.   GAIT: Gait pattern: step to pattern Distance walked: 8 feet Assistive device utilized: Hemi walker Level of assistance: Mod A Comments: Patient required VC for seqencing and use of left AFO today. - fatigues very quickly and left knee buckling with weight bearing.   FUNCTIONAL TESTS:  5 times sit to stand: Patient required mod Assist to stand and unable to complete on his own.  PATIENT SURVEYS:  FOTO 36; predicted goal= 47  TODAY'S TREATMENT:                                                                                                                              DATE: 08/05/2022     Therex:   Seated LAQ and march 2 sets of 10 reps (AAROM on the L)  Seated marching 2 x 10 AAROM L only   Therapeutic activities:  patient then peformed the following in // bars with CGA, Gait belt  STS transfer performs with CGA and  UE assist- Min VC to push up from armrest vs. Reaching for bar x 10 reps with slow eccentric control and VC for reminders.   Lateral  weight shift to the affected left side x 15 reps  and progressed attempting to step forward onto green therapad x5 reps x 2 sets (10 total) - VC to stand erect and transition weight over to left LE- difficulty with obvious left knee buckling but patient was able  due to fatigue. Utilized Geologist, engineering for feedback - instructing patient to look up into mirror and correct his posture as he naturally leans to stronger right side.   Ambulation in // bars using right arm on bar and use of left AFO- down and back x 1 (seated rest after walking down) - using mirror as guide to remind patient to look ahead and weight shift to left LE- Able to accept min weight on left LE -  knee remained flexed but did not really buckle and patient was able to clear his foot by using more steppage gait.     PATIENT EDUCATION: Education details: PT plan of care and HEP Person educated: Patient and Spouse Education method: Explanation, Demonstration, Tactile cues, Verbal cues, and Handouts Education comprehension: verbalized understanding, returned demonstration, verbal cues required, tactile cues required, and needs further education  HOME EXERCISE PROGRAM: Access Code: [email or web address] URL: [email or web address] Date: 06/23/2022 Prepared by: Sande Brothers  Exercises - Supine Quad Set  - 1 x daily - 7 x weekly - 3 sets - 10 reps - Seated Long Arc Quad  - 1 x daily - 7 x weekly - 3 sets - 10 reps - Seated March  - 1 x daily - 7 x weekly - 3 sets - 10 reps - Supine Bridge  - 1 x daily - 7 x weekly - 3 sets - 10 reps - Sit to Stand with Counter Support  - 1 x daily - 7 x weekly - 3 sets - 10 reps  GOALS: Goals reviewed with patient? Yes  SHORT TERM GOALS: Target date: 08/04/2022  Pt will be independent with HEP in order to improve strength and balance in order to decrease fall risk and improve function at home and work.  Baseline: EVAL-Patient with no formal HEP in place Goal status: INITIAL  2.  Patient will perform all stand pivot transfers with min Assist using hemi-walker for improved functional independence with mobility in home.  Baseline: EVAL= Mod A with transfers Goal status: INITIAL    LONG TERM GOALS: Target date: 09/15/2022  Pt will improve FOTO to target score of 47  to display perceived improvements in ability to complete ADL's.  Baseline: EVAL= 36 Goal status: INITIAL  2.  Pt will achieve 5x STS in less than 60 seconds in order to demonstrate clinically significant improvement in LE strength. Baseline: EVAL- Mod A with sit to stand transfer Goal status: INITIAL INITIAL 3.  Patient will ambulate > 50 feet with hemiwalker using step to  gait and CGA for improved household mobility.  Baseline: EVAL- 8 feet with hemiwalker and mod A Goal status: INITIAL  4.  Patient will perform all transfers with CGA  including w/c to various surfaces- toilet, bed, chairs using hemiwalker for improved functional independence with mobility.  Baseline:  EVAL- Mod asst with all transfers Goal  status: INITIAL   ASSESSMENT:  CLINICAL IMPRESSION: Continued focus on Left LE strengthening and weight shifting toward affected side. He continues to present with increased left LE weakness specifically quad with weight bearing and walking- he was able to clear his foot well with use of AFO yet continues to struggle requiring max multimodal cues to effectively weight shift toward affected left side.  Pt will continue to benefit from skilled physical therapy intervention to address impairments, improve QOL, and attain therapy goals.       OBJECTIVE IMPAIRMENTS: Abnormal gait, cardiopulmonary status limiting activity, decreased activity tolerance, decreased balance, decreased cognition, decreased coordination, decreased endurance, decreased mobility, difficulty walking, decreased strength, decreased safety awareness, impaired perceived functional ability, impaired sensation, and impaired UE functional use.   ACTIVITY LIMITATIONS: carrying, lifting, bending, standing, squatting, stairs, transfers, and bed mobility  PARTICIPATION LIMITATIONS: meal prep, cleaning, laundry, driving, shopping, community activity, occupation, and yard work  PERSONAL FACTORS: Time since onset of injury/illness/exacerbation and 1-2 comorbidities: HTN, DM  are also affecting patient's functional outcome.   REHAB POTENTIAL: Good  CLINICAL DECISION MAKING: Evolving/moderate complexity  EVALUATION COMPLEXITY: Moderate  PLAN:  PT FREQUENCY: 1-2x/week  PT DURATION: 12 weeks  PLANNED INTERVENTIONS: Therapeutic exercises, Therapeutic activity, Neuromuscular re-education,  Balance training, Gait training, Patient/Family education, Self Care, Joint mobilization, Stair training, Orthotic/Fit training, DME instructions, Dry Needling, Electrical stimulation, Wheelchair mobility training, Spinal mobilization, Cryotherapy, Moist heat, Splintting, Taping, and Manual therapy  PLAN FOR NEXT SESSION: Review and progress LE strengthening. Transfer training for safe mobility, Gait training in // bars or with hemiwalker and close follow with w/c. Add to HEP as appropriate.    Lewis Moccasin, PT 08/06/2022, 8:30 AM

## 2022-08-07 ENCOUNTER — Ambulatory Visit: Payer: Medicaid Other | Admitting: Physical Therapy

## 2022-08-10 NOTE — Therapy (Unsigned)
OUTPATIENT PHYSICAL THERAPY NEURO TREATMENT   Patient Name: Jacob [name] MRN: [id number] DOB:07-10-1975, 47 y.o., male Today's Date: 08/10/2022   PCP: Dr. Townsend Roger REFERRING PROVIDER: Dr. Vassie Loll Eyk  END OF SESSION:        Past Medical History:  Diagnosis Date   Allergy    Diabetes mellitus without complication ([place])    GERD (gastroesophageal reflux disease)    Hypertension    Paralysis ([place])    Stroke [facility])    Past Surgical History:  Procedure Laterality Date   APPENDECTOMY     BUBBLE STUDY  10/02/2021   Procedure: BUBBLE STUDY;  Surgeon: Fay Records, MD;  Location: [place];  Service: Cardiovascular;;   CRANIOPLASTY Right 02/09/2022   Procedure: Craniectomy with Replacement of Bone Flap From the Abdomen;  Surgeon: Newman Pies, MD;  Location: [place];  Service: Neurosurgery;  Laterality: Right;   CRANIOTOMY Right 09/15/2021   Procedure: RIGHT DECOMPRESSIVE CRANIOTOMY, PLACEMENT OF BONE FLAP IN ABDOMEN;  Surgeon: Newman Pies, MD;  Location: [place];  Service: Neurosurgery;  Laterality: Right;   IR CT HEAD LTD  09/12/2021   IR PERCUTANEOUS ART THROMBECTOMY/INFUSION INTRACRANIAL INC DIAG ANGIO  09/12/2021   IR US GUIDE VASC ACCESS RIGHT  09/12/2021   LAPAROSCOPIC APPENDECTOMY  10/24/2013   Procedure: APPENDECTOMY LAPAROSCOPIC;  Surgeon: Shann Medal, MD;  Location: [facility];  Service: General;;   RADIOLOGY WITH ANESTHESIA N/A 09/12/2021   Procedure: IR WITH ANESTHESIA;  Surgeon: Luanne Bras, MD;  Location: [place];  Service: Radiology;  Laterality: N/A;   TEE WITHOUT CARDIOVERSION N/A 10/02/2021   Procedure: TRANSESOPHAGEAL ECHOCARDIOGRAM (TEE);  Surgeon: Fay Records, MD;  Location: [place];  Service: Cardiovascular;  Laterality: N/A;   Patient Active Problem List   Diagnosis Date Noted   Mild episode of recurrent major depressive disorder ([place]) 06/15/2022   Type 2 diabetes mellitus with hyperglycemia, with long-term current use of insulin ([place])     Debility 02/14/2022   Status post craniectomy 02/09/2022   Memory deficit following cerebral infarction 02/07/2022   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 ([place]) 01/19/2022   Pneumonia of both lower lobes due to methicillin resistant Staphylococcus aureus (MRSA) ([place])    Cerebral embolism with cerebral infarction 09/20/2021   Hypernatremia 09/19/2021   Urinary retention 09/18/2021   Fever 09/18/2021   Acute respiratory failure with hypoxia ([place])    Cerebral edema ([place]) 09/15/2021   Acute ischemic stroke ([place]) 09/12/2021   Dysphagia following cerebral infarction 05/25/2021   Vitamin D deficiency 11/04/2020   Hypertensive urgency 10/31/2020   Hyperlipidemia 03/28/2018   Diabetes mellitus without complication ([place]) [id number]   Essential hypertension 02/23/2018    ONSET DATE: 08/2021  REFERRING DIAG:  Diagnosis  I63.9 (ICD-10-CM) - Cerebral infarction, unspecified  R53.81 (ICD-10-CM) - Other malaise    THERAPY DIAG:  Muscle weakness (generalized)  Abnormality of gait and mobility  Difficulty in walking, not elsewhere classified  Unsteadiness on feet  Rationale for Evaluation and Treatment: Rehabilitation  SUBJECTIVE:  SUBJECTIVE STATEMENT: Patient reports fatigued from OT session but no new complaints.    Pt accompanied by: significant other- Wife  PERTINENT HISTORY: Patient is a 47 year old male with recent history of CVA  in April 2023 and then elective cranioplasty in September 2023 with subsequent inpatient rehab and then home health services. Patient has left hemiparesis.   PAIN:  Are you having pain? No  PRECAUTIONS: Fall  WEIGHT BEARING RESTRICTIONS: No  FALLS: Has patient fallen in last 6 months? Yes. 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   RADIATION DOSE REDUCTION: This exam was performed according to the departmental dose-optimization program which includes automated exposure control, adjustment of the mA and/or kV according to patient size and/or use of iterative reconstruction technique.   COMPARISON:  09/19/2021   FINDINGS: Brain: Diffuse cytotoxic edema is noted throughout the distribution of the right MCA and PCA watershed region similar to that seen on the prior exam. No parenchymal hemorrhage is seen. No new focal infarct is seen.   Vascular: No hyperdense vessel or unexpected calcification.   Skull: Large calvarial defect there is noted on the right stable from the prior study.   Sinuses/Orbits: Paranasal sinuses are within normal limits.   Other: Gastric catheter and feeding catheter are noted extending through the nose.   IMPRESSION: Right MCA/PCA infarct stable in appearance from the previous exam. No acute hemorrhage is noted.   Right-sided hemi craniectomy stable from the prior exam. No new focal abnormality is noted.     Electronically Signed   By: Inez Catalina M.D.    On: 09/24/2021 02:47  COGNITION: Overall cognitive status: Impaired-decreased awareness of functional limitations   SENSATION: Light touch: Impaired - decreased  COORDINATION:  Left side hemiparesis  EDEMA:  None observed  MUSCLE TONE: LLE: Hypotonic   POSTURE: rounded shoulders and forward head  LOWER EXTREMITY ROM:     Active  Right Eval Left Eval  Hip flexion    Hip extension    Hip abduction    Hip adduction    Hip internal rotation    Hip external rotation    Knee flexion    Knee extension    Ankle dorsiflexion    Ankle plantarflexion    Ankle inversion    Ankle eversion     (Blank rows = not tested)  LOWER EXTREMITY MMT:    MMT Right Eval Left Eval  Hip flexion 5 2-  Hip extension 5 2-  Hip abduction 5 2-  Hip adduction 5 2-  Hip internal rotation 5 2-  Hip external rotation 5 2-  Knee flexion 5 2-  Knee extension 5 2-  Ankle dorsiflexion 5  0   Ankle plantarflexion    Ankle inversion    Ankle eversion    (Blank rows = not tested)  BED MOBILITY:  Not tested  TRANSFERS: Assistive device utilized: Hemi walker  Sit to stand: Mod A Stand to sit: Mod A Chair to chair: Mod A Floor:  Not tested   STAIRS: *Patient adamantly wanted to attempt today Level of Assistance: Max A Stair Negotiation Technique: Step to Pattern with Bilateral Rails Number of Stairs: 1  Height of Stairs: 6"  Comments: Patient wanted to attempt steps continuously requesting to try- He was able to stand from w/c with mod assist using right arm for support on armrest of chair with gait belt. Once in standing he was able to step up with right LE with physical assist to block his left LE yet able to flex left hip/knee enough to clear 1st step- positioned back in chair.   GAIT: Gait pattern: step to pattern Distance walked: 8 feet Assistive device utilized: Hemi walker Level of assistance: Mod A Comments: Patient required VC for seqencing and use of left AFO today. -  fatigues very quickly and left knee buckling with weight bearing.   FUNCTIONAL TESTS:  5 times sit to stand: Patient required mod Assist to stand and unable to complete on his own.  PATIENT SURVEYS:  FOTO 36; predicted goal= 47  TODAY'S TREATMENT:                                                                                                                              DATE: 08/05/2022     Therex:   Seated LAQ and march 2 sets of 10 reps (AAROM on the L)  Seated marching 2 x 10 AAROM L only   Therapeutic activities:  patient then peformed the following in // bars with CGA, Gait belt  STS STS transfer on mat table with raisd height and staggered feet to improve use of involved side in transfers   Lateral  weight shift to the affected left side x 15 reps  and progressed attempting to step forward onto green therapad x5 reps x 2 sets (10 total) - VC to stand erect and transition weight over to left LE- difficulty with obvious left knee buckling but patient was able  due to fatigue. Utilized Geologist, engineering for feedback - instructing patient to look up into mirror and correct his posture as he naturally leans to stronger right side.   Ambulation in // bars using right arm on bar and use of left AFO- down and back x 1 (seated rest after walking down) -    PATIENT EDUCATION: Education details: PT plan of care and HEP Person educated: Patient and Spouse Education method: Explanation, Demonstration, Tactile cues, Verbal cues, and Handouts Education comprehension: verbalized understanding, returned demonstration, verbal cues required, tactile cues required, and needs further education  HOME EXERCISE PROGRAM: Access Code: [email or web address] URL: [email or web address] Date: 06/23/2022 Prepared by: Sande Brothers  Exercises -  Supine Quad Set  - 1 x daily - 7 x weekly - 3 sets - 10 reps - Seated Long Arc Quad  - 1 x daily - 7 x weekly - 3 sets - 10 reps - Seated March  - 1 x daily - 7 x  weekly - 3 sets - 10 reps - Supine Bridge  - 1 x daily - 7 x weekly - 3 sets - 10 reps - Sit to Stand with Counter Support  - 1 x daily - 7 x weekly - 3 sets - 10 reps  GOALS: Goals reviewed with patient? Yes  SHORT TERM GOALS: Target date: 08/04/2022  Pt will be independent with HEP in order to improve strength and balance in order to decrease fall risk and improve function at home and work.  Baseline: EVAL-Patient with no formal HEP in place Goal status: INITIAL  2.  Patient will perform all stand pivot transfers with min Assist using hemi-walker for improved functional independence with mobility in home.  Baseline: EVAL= Mod A with transfers Goal status: INITIAL    LONG TERM GOALS: Target date: 09/15/2022  Pt will improve FOTO to target score of 47  to display perceived improvements in ability to complete ADL's.  Baseline: EVAL= 36 Goal status: INITIAL  2.  Pt will achieve 5x STS in less than 60 seconds in order to demonstrate clinically significant improvement in LE strength. Baseline: EVAL- Mod A with sit to stand transfer Goal status: INITIAL INITIAL 3.  Patient will ambulate > 50 feet with hemiwalker using step to gait and CGA for improved household mobility.  Baseline: EVAL- 8 feet with hemiwalker and mod A Goal status: INITIAL  4.  Patient will perform all transfers with CGA  including w/c to various surfaces- toilet, bed, chairs using hemiwalker for improved functional independence with mobility.  Baseline:  EVAL- Mod asst with all transfers Goal status: INITIAL   ASSESSMENT:  CLINICAL IMPRESSION: Continued focus on Left LE strengthening and weight shifting toward affected side. He continues to present with increased left LE weakness specifically quad with weight bearing and walking- he was able to clear his foot well with use of AFO yet continues to struggle requiring max multimodal cues to effectively weight shift toward affected left side.  Pt will continue to  benefit from skilled physical therapy intervention to address impairments, improve QOL, and attain therapy goals.       OBJECTIVE IMPAIRMENTS: Abnormal gait, cardiopulmonary status limiting activity, decreased activity tolerance, decreased balance, decreased cognition, decreased coordination, decreased endurance, decreased mobility, difficulty walking, decreased strength, decreased safety awareness, impaired perceived functional ability, impaired sensation, and impaired UE functional use.   ACTIVITY LIMITATIONS: carrying, lifting, bending, standing, squatting, stairs, transfers, and bed mobility  PARTICIPATION LIMITATIONS: meal prep, cleaning, laundry, driving, shopping, community activity, occupation, and yard work  PERSONAL FACTORS: Time since onset of injury/illness/exacerbation and 1-2 comorbidities: HTN, DM  are also affecting patient's functional outcome.   REHAB POTENTIAL: Good  CLINICAL DECISION MAKING: Evolving/moderate complexity  EVALUATION COMPLEXITY: Moderate  PLAN:  PT FREQUENCY: 1-2x/week  PT DURATION: 12 weeks  PLANNED INTERVENTIONS: Therapeutic exercises, Therapeutic activity, Neuromuscular re-education, Balance training, Gait training, Patient/Family education, Self Care, Joint mobilization, Stair training, Orthotic/Fit training, DME instructions, Dry Needling, Electrical stimulation, Wheelchair mobility training, Spinal mobilization, Cryotherapy, Moist heat, Splintting, Taping, and Manual therapy  PLAN FOR NEXT SESSION: Review and progress LE strengthening. Transfer training for safe mobility, Gait training in // bars or with hemiwalker and close follow with w/c.  Add to HEP as appropriate.    Particia Lather, PT 08/10/2022, 5:22 PM

## 2022-08-11 ENCOUNTER — Ambulatory Visit: Payer: Medicaid Other | Admitting: Occupational Therapy

## 2022-08-11 ENCOUNTER — Encounter: Payer: Self-pay | Admitting: Physical Therapy

## 2022-08-11 ENCOUNTER — Ambulatory Visit: Payer: Medicaid Other | Admitting: Physical Therapy

## 2022-08-11 DIAGNOSIS — R269 Unspecified abnormalities of gait and mobility: Secondary | ICD-10-CM

## 2022-08-11 DIAGNOSIS — M6281 Muscle weakness (generalized): Secondary | ICD-10-CM

## 2022-08-11 DIAGNOSIS — R2681 Unsteadiness on feet: Secondary | ICD-10-CM

## 2022-08-11 DIAGNOSIS — R262 Difficulty in walking, not elsewhere classified: Secondary | ICD-10-CM

## 2022-08-11 NOTE — Therapy (Addendum)
OUTPATIENT OCCUPATIONAL THERAPY NEURO TREATMENT  Patient Name: Jacob [name] MRN: [id number] DOB:[birth year], 47 y.o., male Today's Date: 08/11/2022  PCP: Dr. Townsend Roger REFERRING PROVIDER: Dr. Vassie Loll Eyk  END OF SESSION:  OT End of Session - 08/11/22 1407     Visit Number 8    Number of Visits 24    Date for OT Re-Evaluation 09/10/22    OT Start Time 1020    OT Stop Time 1100    OT Time Calculation (min) 40 min    Equipment Utilized During Treatment wc    Activity Tolerance Patient tolerated treatment well    Behavior During Therapy WFL for tasks assessed/performed             Past Medical History:  Diagnosis Date   Allergy    Diabetes mellitus without complication ([place])    GERD (gastroesophageal reflux disease)    Hypertension    Paralysis ([place])    Stroke [facility])    Past Surgical History:  Procedure Laterality Date   APPENDECTOMY     BUBBLE STUDY  10/02/2021   Procedure: BUBBLE STUDY;  Surgeon: Fay Records, MD;  Location: [place];  Service: Cardiovascular;;   CRANIOPLASTY Right 02/09/2022   Procedure: Craniectomy with Replacement of Bone Flap From the Abdomen;  Surgeon: Newman Pies, MD;  Location: [place];  Service: Neurosurgery;  Laterality: Right;   CRANIOTOMY Right 09/15/2021   Procedure: RIGHT DECOMPRESSIVE CRANIOTOMY, PLACEMENT OF BONE FLAP IN ABDOMEN;  Surgeon: Newman Pies, MD;  Location: [place];  Service: Neurosurgery;  Laterality: Right;   IR CT HEAD LTD  09/12/2021   IR PERCUTANEOUS ART THROMBECTOMY/INFUSION INTRACRANIAL INC DIAG ANGIO  09/12/2021   IR US GUIDE VASC ACCESS RIGHT  09/12/2021   LAPAROSCOPIC APPENDECTOMY  10/24/2013   Procedure: APPENDECTOMY LAPAROSCOPIC;  Surgeon: Shann Medal, MD;  Location: [facility];  Service: General;;   RADIOLOGY WITH ANESTHESIA N/A 09/12/2021   Procedure: IR WITH ANESTHESIA;  Surgeon: Luanne Bras, MD;  Location: [place];  Service: Radiology;  Laterality: N/A;   TEE WITHOUT CARDIOVERSION N/A 10/02/2021    Procedure: TRANSESOPHAGEAL ECHOCARDIOGRAM (TEE);  Surgeon: Fay Records, MD;  Location: [place];  Service: Cardiovascular;  Laterality: N/A;   Patient Active Problem List   Diagnosis Date Noted   Mild episode of recurrent major depressive disorder ([place]) 06/15/2022   Type 2 diabetes mellitus with hyperglycemia, with long-term current use of insulin ([place])    Debility 02/14/2022   Status post craniectomy 02/09/2022   Memory deficit following cerebral infarction 02/07/2022   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 ([place]) 01/19/2022   Pneumonia of both lower lobes due to methicillin resistant Staphylococcus aureus (MRSA) ([place])    Cerebral embolism with cerebral infarction 09/20/2021   Hypernatremia 09/19/2021   Urinary retention 09/18/2021   Fever 09/18/2021   Acute respiratory failure with hypoxia ([facility])    Cerebral edema ([place]) 09/15/2021   Acute ischemic stroke ([place]) 09/12/2021   Dysphagia following cerebral infarction 05/25/2021   Vitamin D deficiency 11/04/2020   Hypertensive urgency 10/31/2020   Hyperlipidemia 03/28/2018   Diabetes mellitus without complication ([place]) [id number]   Essential hypertension 02/23/2018    ONSET DATE: April 2023  REFERRING DIAG: I63.9 (ICD-10-CM) - Cerebral infarction, unspecified   THERAPY DIAG:   Muscle weakness (generalized)  Rationale for Evaluation and Treatment: Rehabilitation  SUBJECTIVE:  SUBJECTIVE STATEMENT:  Pt. was present with his wife, and son for therapy.  Pt accompanied by: self and significant other  PERTINENT HISTORY: Per chart, Jacob [name] is a 47 y.o. male with a history of prior CVA with decompressive craniectomy in April 2023.  He has a history of right peroneal DVT on Eliquis.  He has left hemiparesis from stroke.   Per spouse, pt spent some time in [place], then 2 months at [facility] (SNF). He presented for elective cranioplasty in Sept 2023.  Pt was admitted to rehab  02/14/2022 for inpatient therapies following cranioplasty, and participated in [facility] therapies until recently.      PRECAUTIONS: Fall  WEIGHT BEARING RESTRICTIONS: No  PAIN:  Are you having pain? Yes: NPRS scale: 2 (rest), (activity) 5/10 Pain location: L shoulder sublux Pain description: achy Aggravating factors: movement of the L shoulder  Relieving factors: lying down and gentle stretching   FALLS: Has patient fallen in last 6 months? Yes. Number of falls 1  LIVING ENVIRONMENT: Lives with: lives with their spouse and 70 y/o son, 1 cousin, and 1 friend Lives in: 2 level with master bedroom on the main level, pt resides only on 1st level Stairs: 1 step no rail to enter, pt does have small ramp Has following equipment at home: Hemi walker, Wheelchair (manual), Shower bench, bed side commode, Grab bars, Ramped entry, and platform walker (grab bar by the toilet but not in the shower.  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  PATIENT GOALS: Pt wants to get back to walking and being as indep as possible.   OBJECTIVE:   HAND DOMINANCE: Right  ADLs: Overall ADLs: HHA Mon-Fri 5 hours/day; spouse is pt's primary caregiver Transfers/ambulation related to ADLs: min A sit to stand pivot from wc using gait belt  Eating: assist to cut food  Grooming: min A to manage facial hair UB Dressing: max A LB Dressing: max-dep A Toileting: dep, wears adult briefs.  Spouse reports pt has just started transitioning to use of BSC over the toilet vs voiding in a brief Bathing: max A with sponge/bed bath (in the process of getting bathroom re-done to enable pt to take a shower) Tub Shower transfers: N/A at this time; pt states he doesn't trust himself with the glass doors. Equipment:  see above  IADLs: Shopping: dep Light housekeeping: dep Meal Prep: dep Community mobility: wc dep  Medication management: dep Financial management:  dep Handwriting:  Pt is R hand dominant, NT  MOBILITY STATUS:  Pt reports he can amb with hemi-walker 10-20 ft with max A (per spouse) and someone following with a wc behind him  POSTURE COMMENTS:  rounded shoulders, forward head, and L sided hemiplegia (L shoulder subluxation ~1 thumb breadth) Sitting balance:  able to sit edge of mat and tolerate moderate perturbation from all directions.  Pt can reach forward to ankles and return to midline without LOB.  FUNCTIONAL OUTCOME MEASURES: FOTO: 20; predicted 29  UPPER EXTREMITY ROM:    Passive ROM Right Eval (A/PROMWNL) Left eval  Shoulder flexion  78  Shoulder abduction  60  Shoulder adduction    Shoulder extension    Shoulder internal rotation    Shoulder external rotation  -10 (elbow at side)  Elbow flexion    Elbow extension    Wrist flexion  70  Wrist extension  48  Wrist ulnar deviation    Wrist radial deviation    Wrist pronation  90  Wrist supination  60  (Blank rows = not tested)  L hand  passively opens ~75% of full extension d/t PIP and MP tightness in all digits.  UPPER EXTREMITY MMT:     MMT Right eval Left eval  Shoulder flexion 5 0  Shoulder abduction 5 0  Shoulder adduction    Shoulder extension    Shoulder internal rotation    Shoulder external rotation    Middle trapezius    Lower trapezius    Elbow flexion 5 0  Elbow extension 5 0  Wrist flexion 5 0  Wrist extension 5 0  Wrist ulnar deviation    Wrist radial deviation    Wrist pronation    Wrist supination    (Blank rows = not tested)  HAND FUNCTION: Grip strength: Right: 95 lbs; Left: NT lbs, Lateral pinch: Right: 25 lbs, Left: NT lbs, and 3 point pinch: Right: 20 lbs, Left: NT lbs  COORDINATION: 9 Hole Peg test: Right: NT sec; Left: unable sec  SENSATION: LUE: Light touch: Impaired  Proprioception: Impaired   EDEMA: None  MUSCLE TONE: LUE: Severe and Hypertonic sublux 1 thumb breadth  COGNITION: Overall cognitive status:   Decreased awareness of deficits  VISION: Subjective report: Pt reports decreased peripheral vision on the L Baseline vision: No visual deficits Visual history: N/A  VISION ASSESSMENT: Tracking/Visual pursuits: Decreased smoothness of eye movement to Left superior field, Decreased smoothness of eye movement to Left inferior field, and Requires cues, head turns, or add eye shifts to track Saccades: additional eye shifts occurred during testing and additional head turns occurred during testing Visual Fields: Left homonymous hemianopsia  PERCEPTION: Not tested, will assess in functional contexts with additional visits  PRAXIS: Impaired: Motor planning  OBSERVATIONS: L hand rests in flexion.  Noted slight odor originating from palm/between fingers.  Skin on volar hand appears to have some yeast.  Encouraged pt/spouse bring hand splint to all sessions to work towards opening up hand for improving skin integrity.  Spouse will bring next week.  TODAY'S TREATMENT:             Therapeutic Exercise:  Pt. tolerated PROM throughout the LUE, including all ranges for the L shoulder, elbow, forearm, wrist flex/ext, and digit flex/ext.  Encouraged pt to perform self ROM for the elbow, forearm, wrist, and hand. ROM was performed following moist heat modality.   Manual therapy:  Pt. tolerated soft tissue massage to the left scapular musculature. Pt. tolerated soft tissue mobilizations for carpal, and metacarpal spread stretches, and soft tissue massage at the lateral, and sagittal bands of the left 3rd through 5th digits, 2/2 tightness. Manual therapy was performed independent of, and in preparation for ther. Ex. and ROM.   PATIENT EDUCATION: Education details: LUE PROM Person educated: Patient and Spouse Education method: Explanation and Verbal cues, tactile cues, demo, handout Education comprehension: verbalized understanding and needs further education  HOME EXERCISE PROGRAM: PROM throughout the  LUE  GOALS: Goals reviewed with patient? Yes  SHORT TERM GOALS: Target date: 07/30/22 (6 weeks)  Pt/caregivers will demo indep with HEP for reducing pain and stiffness throughout the LUE. Baseline: Not yet initiated Goal status: INITIAL  2.  Pt/caregivers will demo supportive positioning techniques with min vc to support L hemiparetic limb. Baseline: Educ not yet initiated Goal status: INITIAL  3.  Pt/caregivers will follow L hand splinting schedule with at last 50% compliance in order to reduce contracture risk and to optimize skin integrity of L hand. Baseline: Not yet initiated.   Goal status: INITIAL   LONG TERM GOALS: Target date: 09/10/22 (  12 weeks)  Pt will increase FOTO score to 29 or more to indicate increased indep with daily tasks.   Baseline: Eval: 20 (predicted 29) Goal status: INITIAL  2.  Pt will transfer wc<>BSC with min guard Baseline: min-mod A Goal status: INITIAL  3.  Pt will don/doff shirt with min A Baseline: Mod-max A Goal status: INITIAL  4.  Pt will manage clothing for toileting with min A Baseline: max A-dep Goal status: INITIAL  5.  Pt will increase L shoulder strength by 1 MM grade to assist with repositioning L upper arm away from side for underarm care and donning shirt. Baseline: L shoulder 0/5 Goal status: INITIAL  6.  Through manual therapy, neuro re-ed, and therapeutic exercises, pt will report 3/10 pain or less throughout the LUE to increase tolerance for engaging the LUE into ADLs.   Baseline: LUE 5/10 pain Goal status: INITIAL  7.  Pt will perform supine<>sit bed transfer with supv and AE as needed into his queen size bed.  Baseline: Mod A into hospital bed  Goal status: New  ASSESSMENT:  CLINICAL IMPRESSION:  Pt. tolerated ROM well in the left UE. Pt. was able to consistently initiate biceps activation in preparation for hand to mouth patterns, as well as triceps activation. Pt. and his wife report they try to do ROM at home. Pt  will continue to benefit from skilled OT to work towards optimal positioning of the LUE for contracture prevention, maximize ROM throughout the LUE for ADLs, decrease pain, maximize engagement of the LUE into daily tasks, as well work to maximize level of indep with daily tasks.   PERFORMANCE DEFICITS: in functional skills including ADLs, IADLs, coordination, dexterity, proprioception, sensation, tone, ROM, strength, pain, flexibility, Fine motor control, Gross motor control, mobility, balance, body mechanics, decreased knowledge of use of DME, skin integrity, vision, and UE functional use, cognitive skills including perception and safety awareness, and psychosocial skills including coping strategies and routines and behaviors.   IMPAIRMENTS: are limiting patient from ADLs, IADLs, leisure, and social participation.   CO-MORBIDITIES: may have co-morbidities  that affects occupational performance. Patient will benefit from skilled OT to address above impairments and improve overall function.  MODIFICATION OR ASSISTANCE TO COMPLETE EVALUATION: No modification of tasks or assist necessary to complete an evaluation.  OT OCCUPATIONAL PROFILE AND HISTORY: Problem focused assessment: Including review of records relating to presenting problem.  CLINICAL DECISION MAKING: Moderate - several treatment options, min-mod task modification necessary  REHAB POTENTIAL: Good  EVALUATION COMPLEXITY: High    PLAN:  OT FREQUENCY: 2x/week  OT DURATION: 12 weeks  PLANNED INTERVENTIONS: self care/ADL training, therapeutic exercise, therapeutic activity, neuromuscular re-education, manual therapy, passive range of motion, balance training, functional mobility training, splinting, electrical stimulation, moist heat, cryotherapy, patient/family education, cognitive remediation/compensation, visual/perceptual remediation/compensation, psychosocial skills training, coping strategies training, and DME and/or AE  instructions  RECOMMENDED OTHER SERVICES: N/A  CONSULTED AND AGREED WITH PLAN OF CARE: Patient and family member/caregiver  PLAN FOR NEXT SESSION: see plan  Harrel Carina, MS, OTR/L 08/11/2022, 2:27 PM

## 2022-08-11 NOTE — Therapy (Signed)
OUTPATIENT PHYSICAL THERAPY NEURO TREATMENT   Patient Name: Jacob [name] MRN: [id number] DOB:[birth year], 47 y.o., male Today's Date: 08/11/2022   PCP: Dr. Townsend Roger REFERRING PROVIDER: Dr. Vassie Loll Eyk  END OF SESSION:  PT End of Session - 08/11/22 0941     Visit Number 7    Number of Visits 24    Date for PT Re-Evaluation 09/15/22    Authorization Type [place]    Progress Note Due on Visit 10    PT Start Time 0938    PT Stop Time 1014    PT Time Calculation (min) 36 min    Equipment Utilized During Treatment Gait belt    Activity Tolerance Patient tolerated treatment well    Behavior During Therapy WFL for tasks assessed/performed                  Past Medical History:  Diagnosis Date   Allergy    Diabetes mellitus without complication ([place])    GERD (gastroesophageal reflux disease)    Hypertension    Paralysis ([place])    Stroke [facility])    Past Surgical History:  Procedure Laterality Date   APPENDECTOMY     BUBBLE STUDY  10/02/2021   Procedure: BUBBLE STUDY;  Surgeon: Fay Records, MD;  Location: [place];  Service: Cardiovascular;;   CRANIOPLASTY Right 02/09/2022   Procedure: Craniectomy with Replacement of Bone Flap From the Abdomen;  Surgeon: Newman Pies, MD;  Location: [place];  Service: Neurosurgery;  Laterality: Right;   CRANIOTOMY Right 09/15/2021   Procedure: RIGHT DECOMPRESSIVE CRANIOTOMY, PLACEMENT OF BONE FLAP IN ABDOMEN;  Surgeon: Newman Pies, MD;  Location: [place];  Service: Neurosurgery;  Laterality: Right;   IR CT HEAD LTD  09/12/2021   IR PERCUTANEOUS ART THROMBECTOMY/INFUSION INTRACRANIAL INC DIAG ANGIO  09/12/2021   IR US GUIDE VASC ACCESS RIGHT  09/12/2021   LAPAROSCOPIC APPENDECTOMY  10/24/2013   Procedure: APPENDECTOMY LAPAROSCOPIC;  Surgeon: Shann Medal, MD;  Location: [facility];  Service: General;;   RADIOLOGY WITH ANESTHESIA N/A 09/12/2021   Procedure: IR WITH ANESTHESIA;  Surgeon: Luanne Bras, MD;  Location: [place];  Service: Radiology;  Laterality: N/A;   TEE WITHOUT CARDIOVERSION N/A 10/02/2021   Procedure: TRANSESOPHAGEAL ECHOCARDIOGRAM (TEE);  Surgeon: Fay Records, MD;  Location: [place];  Service: Cardiovascular;  Laterality: N/A;   Patient Active Problem List   Diagnosis Date Noted   Mild episode of recurrent major depressive disorder ([place]) 06/15/2022   Type 2 diabetes mellitus with hyperglycemia, with long-term current use of insulin ([place])    Debility 02/14/2022   Status post craniectomy 02/09/2022   Memory deficit following cerebral infarction 02/07/2022   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 ([place]) 01/19/2022   Pneumonia of both lower lobes due to methicillin resistant Staphylococcus aureus (MRSA) ([place])    Cerebral embolism with cerebral infarction 09/20/2021   Hypernatremia 09/19/2021   Urinary retention 09/18/2021   Fever 09/18/2021   Acute respiratory failure with hypoxia ([place])    Cerebral edema ([place]) 09/15/2021   Acute ischemic stroke ([place]) 09/12/2021   Dysphagia following cerebral infarction 05/25/2021   Vitamin D deficiency 11/04/2020   Hypertensive urgency 10/31/2020   Hyperlipidemia 03/28/2018   Diabetes mellitus without complication ([place]) [id number]   Essential hypertension 02/23/2018    ONSET DATE: 08/2021  REFERRING DIAG:  Diagnosis  I63.9 (ICD-10-CM) - Cerebral infarction, unspecified  R53.81 (ICD-10-CM) - Other malaise    THERAPY DIAG:  Muscle weakness (generalized)  Abnormality of gait and mobility  Difficulty in walking, not elsewhere classified  Unsteadiness on feet  Rationale for Evaluation and Treatment: Rehabilitation  SUBJECTIVE:                                                                                                                                                                                             SUBJECTIVE STATEMENT: Patient reports a fall when standing to move laundry basket and required fire  department to help him get up. Reports no injury from this.    Pt accompanied by: significant other- Wife  PERTINENT HISTORY: Patient is a 47 year old male with recent history of CVA  in April 2023 and then elective cranioplasty in September 2023 with subsequent inpatient rehab and then home health services. Patient has left hemiparesis.   PAIN:  Are you having pain? No  PRECAUTIONS: Fall  WEIGHT BEARING RESTRICTIONS: No  FALLS: Has patient fallen in last 6 months? Yes. 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   RADIATION DOSE REDUCTION: This exam was performed according to the departmental dose-optimization program which includes automated exposure control, adjustment of the mA and/or kV according to patient size and/or use of iterative reconstruction technique.   COMPARISON:  09/19/2021   FINDINGS: Brain: Diffuse cytotoxic edema is noted throughout the distribution of the right MCA and PCA watershed region similar to that seen on the prior exam. No parenchymal hemorrhage is seen. No new focal infarct is seen.   Vascular: No hyperdense vessel or  unexpected calcification.   Skull: Large calvarial defect there is noted on the right stable from the prior study.   Sinuses/Orbits: Paranasal sinuses are within normal limits.   Other: Gastric catheter and feeding catheter are noted extending through the nose.   IMPRESSION: Right MCA/PCA infarct stable in appearance  from the previous exam. No acute hemorrhage is noted.   Right-sided hemi craniectomy stable from the prior exam. No new focal abnormality is noted.     Electronically Signed   By: Inez Catalina M.D.   On: 09/24/2021 02:47  COGNITION: Overall cognitive status: Impaired-decreased awareness of functional limitations   SENSATION: Light touch: Impaired - decreased  COORDINATION:  Left side hemiparesis  EDEMA:  None observed  MUSCLE TONE: LLE: Hypotonic   POSTURE: rounded shoulders and forward head  LOWER EXTREMITY ROM:     Active  Right Eval Left Eval  Hip flexion    Hip extension    Hip abduction    Hip adduction    Hip internal rotation    Hip external rotation    Knee flexion    Knee extension    Ankle dorsiflexion    Ankle plantarflexion    Ankle inversion    Ankle eversion     (Blank rows = not tested)  LOWER EXTREMITY MMT:    MMT Right Eval Left Eval  Hip flexion 5 2-  Hip extension 5 2-  Hip abduction 5 2-  Hip adduction 5 2-  Hip internal rotation 5 2-  Hip external rotation 5 2-  Knee flexion 5 2-  Knee extension 5 2-  Ankle dorsiflexion 5  0   Ankle plantarflexion    Ankle inversion    Ankle eversion    (Blank rows = not tested)  BED MOBILITY:  Not tested  TRANSFERS: Assistive device utilized: Hemi walker  Sit to stand: Mod A Stand to sit: Mod A Chair to chair: Mod A Floor:  Not tested   STAIRS: *Patient adamantly wanted to attempt today Level of Assistance: Max A Stair Negotiation Technique: Step to Pattern with Bilateral Rails Number of Stairs: 1  Height of Stairs: 6"  Comments: Patient wanted to attempt  steps continuously requesting to try- He was able to stand from w/c with mod assist using right arm for support on armrest of chair with gait belt. Once in standing he was able to step up with right LE with physical assist to block his left LE yet able to flex left hip/knee enough to clear 1st step- positioned back in chair.   GAIT: Gait pattern: step to pattern Distance walked: 8 feet Assistive device utilized: Hemi walker Level of assistance: Mod A Comments: Patient required VC for seqencing and use of left AFO today. - fatigues very quickly and left knee buckling with weight bearing.   FUNCTIONAL TESTS:  5 times sit to stand: Patient required mod Assist to stand and unable to complete on his own.  PATIENT SURVEYS:  FOTO 71; predicted goal= 47  TODAY'S TREATMENT:                                                                                                                              DATE: 08/11/22      Therex:   Seated  LAQ and march 2 sets of 10 reps (AAROM on the L and 5# AW on the R)  Seated marching 2 x 10 AAROM L only and 5# AW on the R  Seated LLE heel slide x 10 reps AAROM with PT doing most of the work.  Seated L hip ADD/ ABD, very min muscle activation particularly with adduction.    Therapeutic activities:  patient then peformed the following in // bars with CGA, Gait belt  Ambulation forward and backward with cues for forward gave into mirror and for weight shift, poor weight shift and pt essentially hops with post walking.  Seated rest with seated therex - Knee flexion heel slide  Placed 2x4 parallel in middle of // bars- ambulated forward and backwards in // bars with improved efficacy and step through gait pattern going forward and improved step to gait pattern with retro gait.     PATIENT EDUCATION: Education details: PT plan of care and HEP Person educated: Patient and Spouse Education method: Explanation, Demonstration, Tactile cues, Verbal cues, and  Handouts Education comprehension: verbalized understanding, returned demonstration, verbal cues required, tactile cues required, and needs further education  HOME EXERCISE PROGRAM: Access Code: [email or web address] URL: [email or web address] Date: 06/23/2022 Prepared by: Sande Brothers  Exercises - Supine Quad Set  - 1 x daily - 7 x weekly - 3 sets - 10 reps - Seated Long Arc Quad  - 1 x daily - 7 x weekly - 3 sets - 10 reps - Seated March  - 1 x daily - 7 x weekly - 3 sets - 10 reps - Supine Bridge  - 1 x daily - 7 x weekly - 3 sets - 10 reps - Sit to Stand with Counter Support  - 1 x daily - 7 x weekly - 3 sets - 10 reps  GOALS: Goals reviewed with patient? Yes  SHORT TERM GOALS: Target date: 08/04/2022  Pt will be independent with HEP in order to improve strength and balance in order to decrease fall risk and improve function at home and work.  Baseline: EVAL-Patient with no formal HEP in place Goal status: INITIAL  2.  Patient will perform all stand pivot transfers with min Assist using hemi-walker for improved functional independence with mobility in home.  Baseline: EVAL= Mod A with transfers Goal status: INITIAL    LONG TERM GOALS: Target date: 09/15/2022  Pt will improve FOTO to target score of 47  to display perceived improvements in ability to complete ADL's.  Baseline: EVAL= 36 Goal status: INITIAL  2.  Pt will achieve 5x STS in less than 60 seconds in order to demonstrate clinically significant improvement in LE strength. Baseline: EVAL- Mod A with sit to stand transfer Goal status: INITIAL INITIAL 3.  Patient will ambulate > 50 feet with hemiwalker using step to gait and CGA for improved household mobility.  Baseline: EVAL- 8 feet with hemiwalker and mod A Goal status: INITIAL  4.  Patient will perform all transfers with CGA  including w/c to various surfaces- toilet, bed, chairs using hemiwalker for improved functional independence with mobility.   Baseline:  EVAL- Mod asst with all transfers Goal status: INITIAL   ASSESSMENT:  CLINICAL IMPRESSION:  Continued focus on Left LE strengthening and weight shifting toward affected side. Pt did well with weight shifting and stepping wheh performing with 2x4 for cues between legs when ambulating in // bars ( 2x4 on floor to prevent NBOS gait) and will continue to try this in future session  to mobilize patient.  Pt does require rest breaks between ambulatory bouts secondary to fatigue. Pt will continue to benefit from skilled physical therapy intervention to address impairments, improve QOL, and attain therapy goals.       OBJECTIVE IMPAIRMENTS: Abnormal gait, cardiopulmonary status limiting activity, decreased activity tolerance, decreased balance, decreased cognition, decreased coordination, decreased endurance, decreased mobility, difficulty walking, decreased strength, decreased safety awareness, impaired perceived functional ability, impaired sensation, and impaired UE functional use.   ACTIVITY LIMITATIONS: carrying, lifting, bending, standing, squatting, stairs, transfers, and bed mobility  PARTICIPATION LIMITATIONS: meal prep, cleaning, laundry, driving, shopping, community activity, occupation, and yard work  PERSONAL FACTORS: Time since onset of injury/illness/exacerbation and 1-2 comorbidities: HTN, DM  are also affecting patient's functional outcome.   REHAB POTENTIAL: Good  CLINICAL DECISION MAKING: Evolving/moderate complexity  EVALUATION COMPLEXITY: Moderate  PLAN:  PT FREQUENCY: 1-2x/week  PT DURATION: 12 weeks  PLANNED INTERVENTIONS: Therapeutic exercises, Therapeutic activity, Neuromuscular re-education, Balance training, Gait training, Patient/Family education, Self Care, Joint mobilization, Stair training, Orthotic/Fit training, DME instructions, Dry Needling, Electrical stimulation, Wheelchair mobility training, Spinal mobilization, Cryotherapy, Moist heat,  Splintting, Taping, and Manual therapy  PLAN FOR NEXT SESSION: Review and progress LE strengthening. Transfer training for safe mobility, Gait training in // bars or with hemiwalker and close follow with w/c. Add to HEP as appropriate.    Particia Lather, PT 08/11/2022, 12:45 PM

## 2022-08-13 ENCOUNTER — Ambulatory Visit: Payer: Medicaid Other | Admitting: Physical Therapy

## 2022-08-18 ENCOUNTER — Encounter: Payer: Medicaid Other | Admitting: Occupational Therapy

## 2022-08-18 ENCOUNTER — Ambulatory Visit: Payer: Medicaid Other

## 2022-08-19 ENCOUNTER — Ambulatory Visit: Payer: Medicaid Other | Admitting: Occupational Therapy

## 2022-08-19 ENCOUNTER — Ambulatory Visit: Payer: Medicaid Other | Admitting: Physical Therapy

## 2022-08-19 DIAGNOSIS — R269 Unspecified abnormalities of gait and mobility: Secondary | ICD-10-CM

## 2022-08-19 DIAGNOSIS — M6281 Muscle weakness (generalized): Secondary | ICD-10-CM

## 2022-08-19 DIAGNOSIS — R262 Difficulty in walking, not elsewhere classified: Secondary | ICD-10-CM

## 2022-08-19 DIAGNOSIS — R2681 Unsteadiness on feet: Secondary | ICD-10-CM

## 2022-08-19 NOTE — Therapy (Signed)
OUTPATIENT PHYSICAL THERAPY NEURO TREATMENT   Patient Name: Jacob [name] MRN: [id number] DOB:03/05/1976, 47 y.o., male Today's Date: 08/19/2022   PCP: Dr. Townsend Roger REFERRING PROVIDER: Dr. Vassie Loll Eyk  END OF SESSION:  PT End of Session - 08/19/22 0934     Visit Number 8    Number of Visits 24    Date for PT Re-Evaluation 09/15/22    Authorization Type [place] Medicaid    Progress Note Due on Visit 10    PT Start Time 0933    PT Stop Time 1013    PT Time Calculation (min) 40 min    Equipment Utilized During Treatment Gait belt    Activity Tolerance Patient tolerated treatment well    Behavior During Therapy WFL for tasks assessed/performed                   Past Medical History:  Diagnosis Date   Allergy    Diabetes mellitus without complication ([place])    GERD (gastroesophageal reflux disease)    Hypertension    Paralysis ([place])    Stroke [facility])    Past Surgical History:  Procedure Laterality Date   APPENDECTOMY     BUBBLE STUDY  10/02/2021   Procedure: BUBBLE STUDY;  Surgeon: Fay Records, MD;  Location: [place];  Service: Cardiovascular;;   CRANIOPLASTY Right 02/09/2022   Procedure: Craniectomy with Replacement of Bone Flap From the Abdomen;  Surgeon: Newman Pies, MD;  Location: [place];  Service: Neurosurgery;  Laterality: Right;   CRANIOTOMY Right 09/15/2021   Procedure: RIGHT DECOMPRESSIVE CRANIOTOMY, PLACEMENT OF BONE FLAP IN ABDOMEN;  Surgeon: Newman Pies, MD;  Location: [place];  Service: Neurosurgery;  Laterality: Right;   IR CT HEAD LTD  09/12/2021   IR PERCUTANEOUS ART THROMBECTOMY/INFUSION INTRACRANIAL INC DIAG ANGIO  09/12/2021   IR US GUIDE VASC ACCESS RIGHT  09/12/2021   LAPAROSCOPIC APPENDECTOMY  10/24/2013   Procedure: APPENDECTOMY LAPAROSCOPIC;  Surgeon: Shann Medal, MD;  Location: [facility];  Service: General;;   RADIOLOGY WITH ANESTHESIA N/A 09/12/2021   Procedure: IR WITH ANESTHESIA;  Surgeon: Luanne Bras, MD;  Location: [place];  Service: Radiology;  Laterality: N/A;   TEE WITHOUT CARDIOVERSION N/A 10/02/2021   Procedure: TRANSESOPHAGEAL ECHOCARDIOGRAM (TEE);  Surgeon: Fay Records, MD;  Location: [place];  Service: Cardiovascular;  Laterality: N/A;   Patient Active Problem List   Diagnosis Date Noted   Mild episode of recurrent major depressive disorder ([place]) 06/15/2022   Type 2 diabetes mellitus with hyperglycemia, with long-term current use of insulin ([place])    Debility 02/14/2022   Status post craniectomy 02/09/2022   Memory deficit following cerebral infarction 02/07/2022   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 ([place]) 01/19/2022   Pneumonia of both lower lobes due to methicillin resistant Staphylococcus aureus (MRSA) ([place])    Cerebral embolism with cerebral infarction 09/20/2021   Hypernatremia 09/19/2021   Urinary retention 09/18/2021   Fever 09/18/2021   Acute respiratory failure with hypoxia ([place])    Cerebral edema ([place]) 09/15/2021   Acute ischemic stroke ([place]) 09/12/2021   Dysphagia following cerebral infarction 05/25/2021   Vitamin D deficiency 11/04/2020   Hypertensive urgency 10/31/2020   Hyperlipidemia 03/28/2018   Diabetes mellitus without complication ([place]) [id number]   Essential hypertension 02/23/2018    ONSET DATE: 08/2021  REFERRING DIAG:  Diagnosis  I63.9 (ICD-10-CM) - Cerebral infarction, unspecified  R53.81 (ICD-10-CM) - Other malaise    THERAPY DIAG:  Muscle weakness (  generalized)  Abnormality of gait and mobility  Difficulty in walking, not elsewhere classified  Unsteadiness on feet  Rationale for Evaluation and Treatment: Rehabilitation  SUBJECTIVE:                                                                                                                                                                                             SUBJECTIVE STATEMENT: Patient reports no changes since last session. He is having no new pains since last  session.    Pt accompanied by: significant other- Wife  PERTINENT HISTORY: Patient is a 47 year old male with recent history of CVA  in April 2023 and then elective cranioplasty in September 2023 with subsequent inpatient rehab and then home health services. Patient has left hemiparesis.   PAIN:  Are you having pain? No  PRECAUTIONS: Fall  WEIGHT BEARING RESTRICTIONS: No  FALLS: Has patient fallen in last 6 months? Yes. 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   RADIATION DOSE REDUCTION: This exam was performed according to the departmental dose-optimization program which includes automated exposure control, adjustment of the mA and/or kV according to patient size and/or use of iterative reconstruction technique.   COMPARISON:  09/19/2021   FINDINGS: Brain: Diffuse cytotoxic edema is noted throughout the distribution of the right MCA and PCA watershed region similar to that seen on the prior exam. No parenchymal hemorrhage is seen. No new focal infarct is seen.   Vascular: No hyperdense vessel or unexpected calcification.   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   Sinuses/Orbits: Paranasal sinuses are within normal limits.   Other: Gastric catheter and feeding catheter are noted extending through the nose.   IMPRESSION: Right MCA/PCA infarct stable in appearance from the previous exam. No acute  hemorrhage is noted.   Right-sided hemi craniectomy stable from the prior exam. No new focal abnormality is noted.     Electronically Signed   By: Inez Catalina M.D.   On: 09/24/2021 02:47  COGNITION: Overall cognitive status: Impaired-decreased awareness of functional limitations   SENSATION: Light touch: Impaired - decreased  COORDINATION:  Left side hemiparesis  EDEMA:  None observed  MUSCLE TONE: LLE: Hypotonic   POSTURE: rounded shoulders and forward head  LOWER EXTREMITY ROM:     Active  Right Eval Left Eval  Hip flexion    Hip extension    Hip abduction    Hip adduction    Hip internal rotation    Hip external rotation    Knee flexion    Knee extension    Ankle dorsiflexion    Ankle plantarflexion    Ankle inversion    Ankle eversion     (Blank rows = not tested)  LOWER EXTREMITY MMT:    MMT Right Eval Left Eval  Hip flexion 5 2-  Hip extension 5 2-  Hip abduction 5 2-  Hip adduction 5 2-  Hip internal rotation 5 2-  Hip external rotation 5 2-  Knee flexion 5 2-  Knee extension 5 2-  Ankle dorsiflexion 5  0   Ankle plantarflexion    Ankle inversion    Ankle eversion    (Blank rows = not tested)  BED MOBILITY:  Not tested  TRANSFERS: Assistive device utilized: Hemi walker  Sit to stand: Mod A Stand to sit: Mod A Chair to chair: Mod A Floor:  Not tested   STAIRS: *Patient adamantly wanted to attempt today Level of Assistance: Max A Stair Negotiation Technique: Step to Pattern with Bilateral Rails Number of Stairs: 1  Height of Stairs: 6"  Comments: Patient wanted to attempt steps continuously requesting to try- He was able to  stand from w/c with mod assist using right arm for support on armrest of chair with gait belt. Once in standing he was able to step up with right LE with physical assist to block his left LE yet able to flex left hip/knee enough to clear 1st step- positioned back in chair.   GAIT: Gait pattern: step to pattern Distance walked: 8 feet Assistive device utilized: Hemi walker Level of assistance: Mod A Comments: Patient required VC for seqencing and use of left AFO today. - fatigues very quickly and left knee buckling with weight bearing.   FUNCTIONAL TESTS:  5 times sit to stand: Patient required mod Assist to stand and unable to complete on his own.  PATIENT SURVEYS:  FOTO 36; predicted goal= 47  TODAY'S TREATMENT:                                                                                                                              DATE: 08/19/22      Therex:   Seated LAQ and march 2 sets of  10 reps (AAROM on the L and 5# AW on the R)  Seated marching 2 x 10 AAROM L only and 5# AW on the R  Seated LLE heel slide x 10 reps AAROM with PT doing most of the work.     Therapeutic activities:  patient then peformed the following in // bars with CGA, Gait belt   Placed 2x4 parallel in middle of // bars- ambulated forward and backwards in // bars with improved efficacy and step through gait pattern going forward and improved step to gait pattern with retro gait.  X 3 rounds, cues for weight shifting, good ability to navigate -LLE trembling on last round showing fatigue.  - Pt instructed to focus on weight shift when performing transition movents at home involving taking a few steps.   Pt required occasional rest breaks due fatigue, PT was quick to ask when pt appeared to be fatiguing in order to prevent excessive fatigue.      PATIENT EDUCATION: Education details: PT plan of care and HEP Person educated: Patient and Spouse Education method: Explanation, Demonstration,  Tactile cues, Verbal cues, and Handouts Education comprehension: verbalized understanding, returned demonstration, verbal cues required, tactile cues required, and needs further education  HOME EXERCISE PROGRAM: Access Code: [email or web address] URL: [email or web address] Date: 06/23/2022 Prepared by: Sande Brothers  Exercises - Supine Quad Set  - 1 x daily - 7 x weekly - 3 sets - 10 reps - Seated Long Arc Quad  - 1 x daily - 7 x weekly - 3 sets - 10 reps - Seated March  - 1 x daily - 7 x weekly - 3 sets - 10 reps - Supine Bridge  - 1 x daily - 7 x weekly - 3 sets - 10 reps - Sit to Stand with Counter Support  - 1 x daily - 7 x weekly - 3 sets - 10 reps  GOALS: Goals reviewed with patient? Yes  SHORT TERM GOALS: Target date: 08/04/2022  Pt will be independent with HEP in order to improve strength and balance in order to decrease fall risk and improve function at home and work.  Baseline: EVAL-Patient with no formal HEP in place Goal status: INITIAL  2.  Patient will perform all stand pivot transfers with min Assist using hemi-walker for improved functional independence with mobility in home.  Baseline: EVAL= Mod A with transfers Goal status: INITIAL    LONG TERM GOALS: Target date: 09/15/2022  Pt will improve FOTO to target score of 47  to display perceived improvements in ability to complete ADL's.  Baseline: EVAL= 36 Goal status: INITIAL  2.  Pt will achieve 5x STS in less than 60 seconds in order to demonstrate clinically significant improvement in LE strength. Baseline: EVAL- Mod A with sit to stand transfer Goal status: INITIAL INITIAL 3.  Patient will ambulate > 50 feet with hemiwalker using step to gait and CGA for improved household mobility.  Baseline: EVAL- 8 feet with hemiwalker and mod A Goal status: INITIAL  4.  Patient will perform all transfers with CGA  including w/c to various surfaces- toilet, bed, chairs using hemiwalker for improved functional  independence with mobility.  Baseline:  EVAL- Mod asst with all transfers Goal status: INITIAL   ASSESSMENT:  CLINICAL IMPRESSION:  Continued focus on Left LE strengthening and weight shifting toward affected side. Pt did well with weight shifting and stepping wheh performing with 2x4 for cues between legs when ambulating in // bars ( 2x4 on floor to  prevent NBOS gait) and will continue to try this in future session to mobilize patient with cues for weight shifting appropriately as well. Without cues pt still has majority of weight on R ( uninvolved LE).   Pt does require rest breaks between ambulatory bouts secondary to fatigue. Pt will continue to benefit from skilled physical therapy intervention to address impairments, improve QOL, and attain therapy goals.       OBJECTIVE IMPAIRMENTS: Abnormal gait, cardiopulmonary status limiting activity, decreased activity tolerance, decreased balance, decreased cognition, decreased coordination, decreased endurance, decreased mobility, difficulty walking, decreased strength, decreased safety awareness, impaired perceived functional ability, impaired sensation, and impaired UE functional use.   ACTIVITY LIMITATIONS: carrying, lifting, bending, standing, squatting, stairs, transfers, and bed mobility  PARTICIPATION LIMITATIONS: meal prep, cleaning, laundry, driving, shopping, community activity, occupation, and yard work  PERSONAL FACTORS: Time since onset of injury/illness/exacerbation and 1-2 comorbidities: HTN, DM  are also affecting patient's functional outcome.   REHAB POTENTIAL: Good  CLINICAL DECISION MAKING: Evolving/moderate complexity  EVALUATION COMPLEXITY: Moderate  PLAN:  PT FREQUENCY: 1-2x/week  PT DURATION: 12 weeks  PLANNED INTERVENTIONS: Therapeutic exercises, Therapeutic activity, Neuromuscular re-education, Balance training, Gait training, Patient/Family education, Self Care, Joint mobilization, Stair training, Orthotic/Fit  training, DME instructions, Dry Needling, Electrical stimulation, Wheelchair mobility training, Spinal mobilization, Cryotherapy, Moist heat, Splintting, Taping, and Manual therapy  PLAN FOR NEXT SESSION: Review and progress LE strengthening. Transfer training for safe mobility, Gait training in // bars or with hemiwalker and close follow with w/c. Add to HEP as appropriate.    Particia Lather, PT 08/19/2022, 10:06 AM

## 2022-08-19 NOTE — Therapy (Signed)
OUTPATIENT OCCUPATIONAL THERAPY NEURO TREATMENT  Patient Name: Jacob [name] MRN: [id number] DOB:07-14-1975, 47 y.o., male Today's Date: 08/19/2022  PCP: Dr. Townsend Roger REFERRING PROVIDER: Dr. Vassie Loll Eyk  END OF SESSION:  OT End of Session - 08/19/22 1115     Visit Number 9    Number of Visits 24    Date for OT Re-Evaluation 09/10/22    Progress Note Due on Visit 10    OT Start Time 0903    OT Stop Time 0930    OT Time Calculation (min) 27 min    Equipment Utilized During Treatment wc    Activity Tolerance Patient tolerated treatment well    Behavior During Therapy [facility] for tasks assessed/performed             Past Medical History:  Diagnosis Date   Allergy    Diabetes mellitus without complication ([place])    GERD (gastroesophageal reflux disease)    Hypertension    Paralysis ([place])    Stroke [facility])    Past Surgical History:  Procedure Laterality Date   APPENDECTOMY     BUBBLE STUDY  10/02/2021   Procedure: BUBBLE STUDY;  Surgeon: Fay Records, MD;  Location: [place];  Service: Cardiovascular;;   CRANIOPLASTY Right 02/09/2022   Procedure: Craniectomy with Replacement of Bone Flap From the Abdomen;  Surgeon: Newman Pies, MD;  Location: [place];  Service: Neurosurgery;  Laterality: Right;   CRANIOTOMY Right 09/15/2021   Procedure: RIGHT DECOMPRESSIVE CRANIOTOMY, PLACEMENT OF BONE FLAP IN ABDOMEN;  Surgeon: Newman Pies, MD;  Location: [place];  Service: Neurosurgery;  Laterality: Right;   IR CT HEAD LTD  09/12/2021   IR PERCUTANEOUS ART THROMBECTOMY/INFUSION INTRACRANIAL INC DIAG ANGIO  09/12/2021   IR US GUIDE VASC ACCESS RIGHT  09/12/2021   LAPAROSCOPIC APPENDECTOMY  10/24/2013   Procedure: APPENDECTOMY LAPAROSCOPIC;  Surgeon: Shann Medal, MD;  Location: [facility];  Service: General;;   RADIOLOGY WITH ANESTHESIA N/A 09/12/2021   Procedure: IR WITH ANESTHESIA;  Surgeon: Luanne Bras, MD;  Location: [place];  Service: Radiology;  Laterality: N/A;   TEE  WITHOUT CARDIOVERSION N/A 10/02/2021   Procedure: TRANSESOPHAGEAL ECHOCARDIOGRAM (TEE);  Surgeon: Fay Records, MD;  Location: [place];  Service: Cardiovascular;  Laterality: N/A;   Patient Active Problem List   Diagnosis Date Noted   Mild episode of recurrent major depressive disorder ([place]) 06/15/2022   Type 2 diabetes mellitus with hyperglycemia, with long-term current use of insulin ([place])    Debility 02/14/2022   Status post craniectomy 02/09/2022   Memory deficit following cerebral infarction 02/07/2022   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 ([place]) 01/19/2022   Pneumonia of both lower lobes due to methicillin resistant Staphylococcus aureus (MRSA) ([place])    Cerebral embolism with cerebral infarction 09/20/2021   Hypernatremia 09/19/2021   Urinary retention 09/18/2021   Fever 09/18/2021   Acute respiratory failure with hypoxia ([facility])    Cerebral edema ([place]) 09/15/2021   Acute ischemic stroke ([place]) 09/12/2021   Dysphagia following cerebral infarction 05/25/2021   Vitamin D deficiency 11/04/2020   Hypertensive urgency 10/31/2020   Hyperlipidemia 03/28/2018   Diabetes mellitus without complication ([place]) [id number]   Essential hypertension 02/23/2018    ONSET DATE: April 2023  REFERRING DIAG: I63.9 (ICD-10-CM) - Cerebral infarction, unspecified   THERAPY DIAG:   Muscle weakness (generalized)  Rationale for Evaluation and Treatment: Rehabilitation  SUBJECTIVE:  SUBJECTIVE STATEMENT:  Pt. reports having a birthday this week.   Pt  accompanied by: self and significant other  PERTINENT HISTORY: Per chart, Jacob [name] is a 47 y.o. male with a history of prior CVA with decompressive craniectomy in April 2023.  He has a history of right peroneal DVT on Eliquis.  He has left hemiparesis from stroke.   Per spouse, pt spent some time in [place], then 2 months at [facility] (SNF). He presented for elective cranioplasty in Sept 2023.  Pt  was admitted to rehab 02/14/2022 for inpatient therapies following cranioplasty, and participated in [facility] therapies until recently.      PRECAUTIONS: Fall  WEIGHT BEARING RESTRICTIONS: No  PAIN:  Are you having pain? Yes: NPRS scale: 2 (rest), (activity) 5/10 Pain location: L shoulder sublux Pain description: achy Aggravating factors: movement of the L shoulder  Relieving factors: lying down and gentle stretching   FALLS: Has patient fallen in last 6 months? Yes. Number of falls 1  LIVING ENVIRONMENT: Lives with: lives with their spouse and 29 y/o son, 1 cousin, and 1 friend Lives in: 2 level with master bedroom on the main level, pt resides only on 1st level Stairs: 1 step no rail to enter, pt does have small ramp Has following equipment at home: Hemi walker, Wheelchair (manual), Shower bench, bed side commode, Grab bars, Ramped entry, and platform walker (grab bar by the toilet but not in the shower.  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  PATIENT GOALS: Pt wants to get back to walking and being as indep as possible.   OBJECTIVE:   HAND DOMINANCE: Right  ADLs: Overall ADLs: HHA Mon-Fri 5 hours/day; spouse is pt's primary caregiver Transfers/ambulation related to ADLs: min A sit to stand pivot from wc using gait belt  Eating: assist to cut food  Grooming: min A to manage facial hair UB Dressing: max A LB Dressing: max-dep A Toileting: dep, wears adult briefs.  Spouse reports pt has just started transitioning to use of BSC over the toilet vs voiding in a brief Bathing: max A with sponge/bed bath (in the process of getting bathroom re-done to enable pt to take a shower) Tub Shower transfers: N/A at this time; pt states he doesn't trust himself with the glass doors. Equipment:  see above  IADLs: Shopping: dep Light housekeeping: dep Meal Prep: dep Community mobility: wc dep  Medication management:  dep Financial management: dep Handwriting:  Pt is R hand dominant, NT  MOBILITY STATUS:  Pt reports he can amb with hemi-walker 10-20 ft with max A (per spouse) and someone following with a wc behind him  POSTURE COMMENTS:  rounded shoulders, forward head, and L sided hemiplegia (L shoulder subluxation ~1 thumb breadth) Sitting balance:  able to sit edge of mat and tolerate moderate perturbation from all directions.  Pt can reach forward to ankles and return to midline without LOB.  FUNCTIONAL OUTCOME MEASURES: FOTO: 20; predicted 29  UPPER EXTREMITY ROM:    Passive ROM Right Eval (A/PROMWNL) Left eval  Shoulder flexion  78  Shoulder abduction  60  Shoulder adduction    Shoulder extension    Shoulder internal rotation    Shoulder external rotation  -10 (elbow at side)  Elbow flexion    Elbow extension    Wrist flexion  70  Wrist extension  48  Wrist ulnar deviation    Wrist radial deviation    Wrist pronation  90  Wrist supination  60  (Blank  rows = not tested)  L hand passively opens ~75% of full extension d/t PIP and MP tightness in all digits.  UPPER EXTREMITY MMT:     MMT Right eval Left eval  Shoulder flexion 5 0  Shoulder abduction 5 0  Shoulder adduction    Shoulder extension    Shoulder internal rotation    Shoulder external rotation    Middle trapezius    Lower trapezius    Elbow flexion 5 0  Elbow extension 5 0  Wrist flexion 5 0  Wrist extension 5 0  Wrist ulnar deviation    Wrist radial deviation    Wrist pronation    Wrist supination    (Blank rows = not tested)  HAND FUNCTION: Grip strength: Right: 95 lbs; Left: NT lbs, Lateral pinch: Right: 25 lbs, Left: NT lbs, and 3 point pinch: Right: 20 lbs, Left: NT lbs  COORDINATION: 9 Hole Peg test: Right: NT sec; Left: unable sec  SENSATION: LUE: Light touch: Impaired  Proprioception: Impaired   EDEMA: None  MUSCLE TONE: LUE: Severe and Hypertonic sublux 1 thumb  breadth  COGNITION: Overall cognitive status:  Decreased awareness of deficits  VISION: Subjective report: Pt reports decreased peripheral vision on the L Baseline vision: No visual deficits Visual history: N/A  VISION ASSESSMENT: Tracking/Visual pursuits: Decreased smoothness of eye movement to Left superior field, Decreased smoothness of eye movement to Left inferior field, and Requires cues, head turns, or add eye shifts to track Saccades: additional eye shifts occurred during testing and additional head turns occurred during testing Visual Fields: Left homonymous hemianopsia  PERCEPTION: Not tested, will assess in functional contexts with additional visits  PRAXIS: Impaired: Motor planning  OBSERVATIONS: L hand rests in flexion.  Noted slight odor originating from palm/between fingers.  Skin on volar hand appears to have some yeast.  Encouraged pt/spouse bring hand splint to all sessions to work towards opening up hand for improving skin integrity.  Spouse will bring next week.  TODAY'S TREATMENT:             Therapeutic Exercise:   Pt. tolerated PROM throughout the LUE, including all ranges for the L shoulder, elbow, forearm, wrist flex/ext, and digit flex/ext.  Encouraged pt to perform self ROM for the elbow, forearm, wrist, and hand. ROM was performed following moist heat modality.   Manual therapy:  Pt. tolerated soft tissue massage to the left scapular musculature. Pt. Tolerated scapular mobilizations for elevation, depression, abduction/rotation. Manual therapy was performed independent of, and in preparation for ther. Ex. and ROM.   PATIENT EDUCATION: Education details: LUE PROM Person educated: Patient and Spouse Education method: Explanation and Verbal cues, tactile cues, demo, handout Education comprehension: verbalized understanding and needs further education  HOME EXERCISE PROGRAM: PROM throughout the LUE  GOALS: Goals reviewed with patient? Yes  SHORT TERM  GOALS: Target date: 07/30/22 (6 weeks)  Pt/caregivers will demo indep with HEP for reducing pain and stiffness throughout the LUE. Baseline: Not yet initiated Goal status: INITIAL  2.  Pt/caregivers will demo supportive positioning techniques with min vc to support L hemiparetic limb. Baseline: Educ not yet initiated Goal status: INITIAL  3.  Pt/caregivers will follow L hand splinting schedule with at last 50% compliance in order to reduce contracture risk and to optimize skin integrity of L hand. Baseline: Not yet initiated.   Goal status: INITIAL   LONG TERM GOALS: Target date: 09/10/22 (12 weeks)  Pt will increase FOTO score to 29 or more to indicate  increased indep with daily tasks.   Baseline: Eval: 20 (predicted 29) Goal status: INITIAL  2.  Pt will transfer wc<>BSC with min guard Baseline: min-mod A Goal status: INITIAL  3.  Pt will don/doff shirt with min A Baseline: Mod-max A Goal status: INITIAL  4.  Pt will manage clothing for toileting with min A Baseline: max A-dep Goal status: INITIAL  5.  Pt will increase L shoulder strength by 1 MM grade to assist with repositioning L upper arm away from side for underarm care and donning shirt. Baseline: L shoulder 0/5 Goal status: INITIAL  6.  Through manual therapy, neuro re-ed, and therapeutic exercises, pt will report 3/10 pain or less throughout the LUE to increase tolerance for engaging the LUE into ADLs.   Baseline: LUE 5/10 pain Goal status: INITIAL  7.  Pt will perform supine<>sit bed transfer with supv and AE as needed into his queen size bed.  Baseline: Mod A into hospital bed  Goal status: New  ASSESSMENT:  CLINICAL IMPRESSION:  Pt.  was late for the session. Pt. presents tightness in the LUE, and hand. Pt. required cues for left sided awareness. Pt will continue to benefit from skilled OT to work towards optimal positioning of the LUE for contracture prevention, maximize ROM throughout the LUE for ADLs,  decrease pain, maximize engagement of the LUE into daily tasks, as well work to maximize level of indep with daily tasks.   PERFORMANCE DEFICITS: in functional skills including ADLs, IADLs, coordination, dexterity, proprioception, sensation, tone, ROM, strength, pain, flexibility, Fine motor control, Gross motor control, mobility, balance, body mechanics, decreased knowledge of use of DME, skin integrity, vision, and UE functional use, cognitive skills including perception and safety awareness, and psychosocial skills including coping strategies and routines and behaviors.   IMPAIRMENTS: are limiting patient from ADLs, IADLs, leisure, and social participation.   CO-MORBIDITIES: may have co-morbidities  that affects occupational performance. Patient will benefit from skilled OT to address above impairments and improve overall function.  MODIFICATION OR ASSISTANCE TO COMPLETE EVALUATION: No modification of tasks or assist necessary to complete an evaluation.  OT OCCUPATIONAL PROFILE AND HISTORY: Problem focused assessment: Including review of records relating to presenting problem.  CLINICAL DECISION MAKING: Moderate - several treatment options, min-mod task modification necessary  REHAB POTENTIAL: Good  EVALUATION COMPLEXITY: High    PLAN:  OT FREQUENCY: 2x/week  OT DURATION: 12 weeks  PLANNED INTERVENTIONS: self care/ADL training, therapeutic exercise, therapeutic activity, neuromuscular re-education, manual therapy, passive range of motion, balance training, functional mobility training, splinting, electrical stimulation, moist heat, cryotherapy, patient/family education, cognitive remediation/compensation, visual/perceptual remediation/compensation, psychosocial skills training, coping strategies training, and DME and/or AE instructions  RECOMMENDED OTHER SERVICES: N/A  CONSULTED AND AGREED WITH PLAN OF CARE: Patient and family member/caregiver  PLAN FOR NEXT SESSION: see  plan  Harrel Carina, MS, OTR/L 08/19/2022, 11:20 AM

## 2022-08-20 ENCOUNTER — Ambulatory Visit: Payer: Medicaid Other

## 2022-08-20 DIAGNOSIS — Z0289 Encounter for other administrative examinations: Secondary | ICD-10-CM

## 2022-08-21 ENCOUNTER — Ambulatory Visit: Payer: Medicaid Other

## 2022-08-25 ENCOUNTER — Ambulatory Visit: Payer: Medicaid Other | Admitting: Physical Therapy

## 2022-08-25 ENCOUNTER — Ambulatory Visit: Payer: Medicaid Other

## 2022-08-25 NOTE — Therapy (Signed)
OUTPATIENT PHYSICAL THERAPY NEURO TREATMENT   Patient Name: Jacob [name] MRN: [id number] DOB:[birth year], 47 y.o., male Today's Date: 08/26/2022   PCP: Dr. [name] REFERRING PROVIDER: Dr. Michel Santee Eyk  END OF SESSION:  PT End of Session - 08/26/22 0953     Visit Number 9    Number of Visits 24    Date for PT Re-Evaluation 09/15/22    Authorization Type [place] [employer]    Progress Note Due on Visit 10    PT Start Time 1015    PT Stop Time 1100    PT Time Calculation (min) 45 min    Equipment Utilized During Treatment Gait belt    Activity Tolerance Patient tolerated treatment well    Behavior During Therapy WFL for tasks assessed/performed                    Past Medical History:  Diagnosis Date   Allergy    Diabetes mellitus without complication    GERD (gastroesophageal reflux disease)    Hypertension    Paralysis    Stroke    Past Surgical History:  Procedure Laterality Date   APPENDECTOMY     BUBBLE STUDY  10/02/2021   Procedure: BUBBLE STUDY;  Surgeon: Pricilla Riffle, MD;  Location: [facility] ENDOSCOPY;  Service: Cardiovascular;;   CRANIOPLASTY Right 02/09/2022   Procedure: Craniectomy with Replacement of Bone Flap From the Abdomen;  Surgeon: Tressie Stalker, MD;  Location: [facility] OR;  Service: Neurosurgery;  Laterality: Right;   CRANIOTOMY Right 09/15/2021   Procedure: RIGHT DECOMPRESSIVE CRANIOTOMY, PLACEMENT OF BONE FLAP IN ABDOMEN;  Surgeon: Tressie Stalker, MD;  Location: [facility] OR;  Service: Neurosurgery;  Laterality: Right;   IR CT HEAD LTD  09/12/2021   IR PERCUTANEOUS ART THROMBECTOMY/INFUSION INTRACRANIAL INC DIAG ANGIO  09/12/2021   IR US GUIDE VASC ACCESS RIGHT  09/12/2021   LAPAROSCOPIC APPENDECTOMY  10/24/2013   Procedure: APPENDECTOMY LAPAROSCOPIC;  Surgeon: Kandis Cocking, MD;  Location: [facility] ORS;  Service: General;;   RADIOLOGY WITH ANESTHESIA N/A 09/12/2021   Procedure: IR WITH ANESTHESIA;  Surgeon: Julieanne Cotton, MD;  Location: [facility] OR;  Service:  Radiology;  Laterality: N/A;   TEE WITHOUT CARDIOVERSION N/A 10/02/2021   Procedure: TRANSESOPHAGEAL ECHOCARDIOGRAM (TEE);  Surgeon: Pricilla Riffle, MD;  Location: [facility] ENDOSCOPY;  Service: Cardiovascular;  Laterality: N/A;   Patient Active Problem List   Diagnosis Date Noted   Mild episode of recurrent major depressive disorder 06/15/2022   Type 2 diabetes mellitus with hyperglycemia, with long-term current use of insulin    Debility 02/14/2022   Status post craniectomy 02/09/2022   Memory deficit following cerebral infarction 02/07/2022   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 01/19/2022   Pneumonia of both lower lobes due to methicillin resistant Staphylococcus aureus (MRSA)    Cerebral embolism with cerebral infarction 09/20/2021   Hypernatremia 09/19/2021   Urinary retention 09/18/2021   Fever 09/18/2021   Acute respiratory failure with hypoxia    Cerebral edema 09/15/2021   Acute ischemic stroke 09/12/2021   Dysphagia following cerebral infarction 05/25/2021   Vitamin D deficiency 11/04/2020   Hypertensive urgency 10/31/2020   Hyperlipidemia 03/28/2018   Diabetes mellitus without complication 02/23/2018   Essential hypertension 02/23/2018    ONSET DATE: 08/2021  REFERRING DIAG:  Diagnosis  I63.9 (ICD-10-CM) - Cerebral infarction, unspecified  R53.81 (ICD-10-CM) - Other malaise    THERAPY DIAG:  Muscle weakness (generalized)  Abnormality of gait and mobility  Difficulty in  walking, not elsewhere classified  Unsteadiness on feet  Rationale for Evaluation and Treatment: Rehabilitation  SUBJECTIVE:                                                                                                                                                                                             SUBJECTIVE STATEMENT: Patient presents with his wife. Patient missed his OT session due to having a hard time getting moving this morning.    Pt accompanied by:  significant other- Wife  PERTINENT HISTORY: Patient is a 47 year old male with recent history of CVA  in April 2023 and then elective cranioplasty in September 2023 with subsequent inpatient rehab and then home health services. Patient has left hemiparesis.   PAIN:  Are you having pain? No  PRECAUTIONS: Fall  WEIGHT BEARING RESTRICTIONS: No  FALLS: Has patient fallen in last 6 months? Yes. 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   RADIATION DOSE REDUCTION: This exam was performed according to the departmental dose-optimization program which includes automated exposure control, adjustment of the mA and/or kV according to patient size and/or use of iterative reconstruction technique.   COMPARISON:  09/19/2021   FINDINGS: Brain: Diffuse cytotoxic edema is noted throughout the distribution of the right MCA and PCA watershed region similar to that seen on the prior exam. No parenchymal hemorrhage is seen. No new focal infarct is seen.   Vascular: No hyperdense vessel or unexpected calcification.   Skull: Large calvarial defect there is noted on the right  stable from the prior study.   Sinuses/Orbits: Paranasal sinuses are within normal limits.   Other: Gastric catheter and feeding catheter are noted extending through the nose.   IMPRESSION: Right MCA/PCA infarct stable in appearance from the previous exam. No acute hemorrhage is noted.   Right-sided  hemi craniectomy stable from the prior exam. No new focal abnormality is noted.     Electronically Signed   By: Alcide Clever M.D.   On: 09/24/2021 02:47  COGNITION: Overall cognitive status: Impaired-decreased awareness of functional limitations   SENSATION: Light touch: Impaired - decreased  COORDINATION:  Left side hemiparesis  EDEMA:  None observed  MUSCLE TONE: LLE: Hypotonic   POSTURE: rounded shoulders and forward head  LOWER EXTREMITY ROM:     Active  Right Eval Left Eval  Hip flexion    Hip extension    Hip abduction    Hip adduction    Hip internal rotation    Hip external rotation    Knee flexion    Knee extension    Ankle dorsiflexion    Ankle plantarflexion    Ankle inversion    Ankle eversion     (Blank rows = not tested)  LOWER EXTREMITY MMT:    MMT Right Eval Left Eval  Hip flexion 5 2-  Hip extension 5 2-  Hip abduction 5 2-  Hip adduction 5 2-  Hip internal rotation 5 2-  Hip external rotation 5 2-  Knee flexion 5 2-  Knee extension 5 2-  Ankle dorsiflexion 5  0   Ankle plantarflexion    Ankle inversion    Ankle eversion    (Blank rows = not tested)  BED MOBILITY:  Not tested  TRANSFERS: Assistive device utilized: Hemi walker  Sit to stand: Mod A Stand to sit: Mod A Chair to chair: Mod A Floor:  Not tested   STAIRS: *Patient adamantly wanted to attempt today Level of Assistance: Max A Stair Negotiation Technique: Step to Pattern with Bilateral Rails Number of Stairs: 1  Height of Stairs: 6"  Comments: Patient wanted to attempt steps continuously requesting to try- He was able to stand from w/c with mod assist  using right arm for support on armrest of chair with gait belt. Once in standing he was able to step up with right LE with physical assist to block his left LE yet able to flex left hip/knee enough to clear 1st step- positioned back in chair.   GAIT: Gait pattern: step to pattern Distance walked: 8 feet Assistive device utilized: Hemi walker Level of assistance: Mod A Comments: Patient required VC for seqencing and use of left AFO today. - fatigues very quickly and left knee buckling with weight bearing.   FUNCTIONAL TESTS:  5 times sit to stand: Patient required mod Assist to stand and unable to complete on his own.  PATIENT SURVEYS:  FOTO 36; predicted goal= 47  TODAY'S TREATMENT:                                                                                                                              DATE: 08/26/22      Therex:  Seated: AAROM contract relax LLE pressing into PT leg on roller stool: push PT  away 10x Eccentric hamstring press down into PT foot x10 LLE Adduction isometric pressing into PT hand LLE 10x  Abduction isometric pressing into PT hand LLE 10x Hip flexion isometric contraction pressing into PT hand LLE 10x     Therapeutic activities:  patient then peformed the following in // bars with CGA, Gait belt   Placed 2x4 parallel in middle of // bars- ambulated forward and backwards in // bars with improved efficacy and step through gait pattern going forward and improved step to gait pattern with retro gait.  X 3 rounds, cues for weight shifting, good ability to navigate -LLE trembling on last round showing fatigue.  - Pt instructed to focus on weight shift when performing transition movents at home involving taking a few steps.  -third round in 1 minute 16 seconds   Standing reach for ball with RUE; throw into hoop for pertubation and stabilization without UE support x 30 balls  Pt required occasional rest breaks due fatigue, PT was quick to ask when  pt appeared to be fatiguing in order to prevent excessive fatigue.   *assist donning/doffing AFO for LLE   PATIENT EDUCATION: Education details: PT plan of care and HEP Person educated: Patient and Spouse Education method: Explanation, Demonstration, Tactile cues, Verbal cues, and Handouts Education comprehension: verbalized understanding, returned demonstration, verbal cues required, tactile cues required, and needs further education  HOME EXERCISE PROGRAM: Access Code: [email or web address] URL: [email or web address] Date: 06/23/2022 Prepared by: Maureen Ralphs  Exercises - Supine Quad Set  - 1 x daily - 7 x weekly - 3 sets - 10 reps - Seated Long Arc Quad  - 1 x daily - 7 x weekly - 3 sets - 10 reps - Seated March  - 1 x daily - 7 x weekly - 3 sets - 10 reps - Supine Bridge  - 1 x daily - 7 x weekly - 3 sets - 10 reps - Sit to Stand with Counter Support  - 1 x daily - 7 x weekly - 3 sets - 10 reps  GOALS: Goals reviewed with patient? Yes  SHORT TERM GOALS: Target date: 08/04/2022  Pt will be independent with HEP in order to improve strength and balance in order to decrease fall risk and improve function at home and work.  Baseline: EVAL-Patient with no formal HEP in place Goal status: INITIAL  2.  Patient will perform all stand pivot transfers with min Assist using hemi-walker for improved functional independence with mobility in home.  Baseline: EVAL= Mod A with transfers Goal status: INITIAL    LONG TERM GOALS: Target date: 09/15/2022  Pt will improve FOTO to target score of 47  to display perceived improvements in ability to complete ADL's.  Baseline: EVAL= 36 Goal status: INITIAL  2.  Pt will achieve 5x STS in less than 60 seconds in order to demonstrate clinically significant improvement in LE strength. Baseline: EVAL- Mod A with sit to stand transfer Goal status: INITIAL INITIAL 3.  Patient will ambulate > 50 feet with hemiwalker using step to gait and CGA  for improved household mobility.  Baseline: EVAL- 8 feet with hemiwalker and mod A Goal status: INITIAL  4.  Patient will perform all transfers with CGA  including w/c to various surfaces- toilet, bed, chairs using hemiwalker for improved functional independence with mobility.  Baseline:  EVAL- Mod asst with all transfers Goal status: INITIAL   ASSESSMENT:  CLINICAL IMPRESSION:  Patient initially presented exhausted to PT session but was able to  complete training. Patient is able to ambulate forwards/backwards with 2x4 between feet with intermittent stabilization for LLE to prevent buckling. Patient is able to static stand and reach without LOB without UE support this session indicating continued area of focus.  Pt will continue to benefit from skilled physical therapy intervention to address impairments, improve QOL, and attain therapy goals.       OBJECTIVE IMPAIRMENTS: Abnormal gait, cardiopulmonary status limiting activity, decreased activity tolerance, decreased balance, decreased cognition, decreased coordination, decreased endurance, decreased mobility, difficulty walking, decreased strength, decreased safety awareness, impaired perceived functional ability, impaired sensation, and impaired UE functional use.   ACTIVITY LIMITATIONS: carrying, lifting, bending, standing, squatting, stairs, transfers, and bed mobility  PARTICIPATION LIMITATIONS: meal prep, cleaning, laundry, driving, shopping, community activity, occupation, and yard work  PERSONAL FACTORS: Time since onset of injury/illness/exacerbation and 1-2 comorbidities: HTN, DM  are also affecting patient's functional outcome.   REHAB POTENTIAL: Good  CLINICAL DECISION MAKING: Evolving/moderate complexity  EVALUATION COMPLEXITY: Moderate  PLAN:  PT FREQUENCY: 1-2x/week  PT DURATION: 12 weeks  PLANNED INTERVENTIONS: Therapeutic exercises, Therapeutic activity, Neuromuscular re-education, Balance training, Gait  training, Patient/Family education, Self Care, Joint mobilization, Stair training, Orthotic/Fit training, DME instructions, Dry Needling, Electrical stimulation, Wheelchair mobility training, Spinal mobilization, Cryotherapy, Moist heat, Splintting, Taping, and Manual therapy  PLAN FOR NEXT SESSION: Review and progress LE strengthening. Transfer training for safe mobility, Gait training in // bars or with hemiwalker and close follow with w/c. Add to HEP as appropriate.    Precious Bard, PT 08/26/2022, 11:25 AM

## 2022-08-26 ENCOUNTER — Ambulatory Visit: Payer: Medicaid Other | Attending: Internal Medicine

## 2022-08-26 ENCOUNTER — Ambulatory Visit: Payer: Medicaid Other

## 2022-08-26 DIAGNOSIS — R278 Other lack of coordination: Secondary | ICD-10-CM | POA: Insufficient documentation

## 2022-08-26 DIAGNOSIS — R269 Unspecified abnormalities of gait and mobility: Secondary | ICD-10-CM | POA: Insufficient documentation

## 2022-08-26 DIAGNOSIS — R262 Difficulty in walking, not elsewhere classified: Secondary | ICD-10-CM | POA: Diagnosis present

## 2022-08-26 DIAGNOSIS — R2681 Unsteadiness on feet: Secondary | ICD-10-CM

## 2022-08-26 DIAGNOSIS — M6281 Muscle weakness (generalized): Secondary | ICD-10-CM | POA: Diagnosis present

## 2022-08-27 ENCOUNTER — Ambulatory Visit: Payer: Medicaid Other

## 2022-08-29 ENCOUNTER — Ambulatory Visit (HOSPITAL_COMMUNITY)
Admission: RE | Admit: 2022-08-29 | Discharge: 2022-08-29 | Disposition: A | Discharge status: Home / Self Care | Payer: Medicaid Other | Source: Ambulatory Visit | Attending: Family Medicine | Admitting: Family Medicine

## 2022-08-29 ENCOUNTER — Encounter (HOSPITAL_COMMUNITY): Payer: Self-pay

## 2022-08-29 NOTE — ED Triage Notes (Signed)
Pt is concerned about the color of his urine x 2days .

## 2022-08-29 NOTE — ED Notes (Signed)
Have been informed by dee, cma, that patient unable to provide a urine specimen.  Has a specimen cup and is bringing specimen back tomorrow.

## 2022-08-29 NOTE — ED Notes (Signed)
Patient has not urinated.  Remains in lobby

## 2022-08-29 NOTE — ED Notes (Signed)
Dr Tracie Harrier aware of plan to collect urine tomorrow.

## 2022-08-30 ENCOUNTER — Ambulatory Visit (HOSPITAL_COMMUNITY)
Admission: EM | Admit: 2022-08-30 | Discharge: 2022-08-30 | Disposition: A | Discharge status: Home / Self Care | Payer: Medicaid Other

## 2022-08-31 ENCOUNTER — Encounter: Payer: Self-pay | Admitting: Neurology

## 2022-08-31 ENCOUNTER — Encounter (HOSPITAL_COMMUNITY): Payer: Self-pay

## 2022-08-31 ENCOUNTER — Ambulatory Visit (HOSPITAL_COMMUNITY)
Admission: EM | Admit: 2022-08-31 | Discharge: 2022-08-31 | Disposition: A | Discharge status: Home / Self Care | Payer: Medicaid Other

## 2022-08-31 ENCOUNTER — Ambulatory Visit: Payer: Medicaid Other

## 2022-08-31 DIAGNOSIS — R82998 Other abnormal findings in urine: Secondary | ICD-10-CM

## 2022-08-31 LAB — POCT URINALYSIS DIPSTICK, ED / UC
Glucose, UA: NEGATIVE mg/dL
Leukocytes,Ua: NEGATIVE
Nitrite: NEGATIVE
Protein, ur: 100 mg/dL — AB
Specific Gravity, Urine: 1.02 (ref 1.005–1.030)
Urobilinogen, UA: 1 mg/dL (ref 0.0–1.0)
pH: 6.5 (ref 5.0–8.0)

## 2022-08-31 NOTE — ED Provider Notes (Signed)
[facility]    CSN: [id number] Arrival date & time: 08/31/22  1636      History   Chief Complaint Chief Complaint  Patient presents with   Dysuria    HPI Jacob [name] is a 47 y.o. male.   Patient presents for evaluation of dark tea colored urine present for 4 days.  Denies frequency, urgency, dysuria, lower abdominal pain or pressure, flank pain, fevers.  Has not attempted treatment of symptoms.  Endorses water intake is approximately at least 20 ounce bottle daily.  History of diabetes.   Past Medical History:  Diagnosis Date   Allergy    Diabetes mellitus without complication    GERD (gastroesophageal reflux disease)    Hypertension    Paralysis    Stroke     Patient Active Problem List   Diagnosis Date Noted   Mild episode of recurrent major depressive disorder 06/15/2022   Type 2 diabetes mellitus with hyperglycemia, with long-term current use of insulin    Debility 02/14/2022   Status post craniectomy 02/09/2022   Memory deficit following cerebral infarction 02/07/2022   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 01/19/2022   Pneumonia of both lower lobes due to methicillin resistant Staphylococcus aureus (MRSA)    Cerebral embolism with cerebral infarction 09/20/2021   Hypernatremia 09/19/2021   Urinary retention 09/18/2021   Fever 09/18/2021   Acute respiratory failure with hypoxia    Cerebral edema 09/15/2021   Acute ischemic stroke 09/12/2021   Dysphagia following cerebral infarction 05/25/2021   Vitamin D deficiency 11/04/2020   Hypertensive urgency 10/31/2020   Hyperlipidemia 03/28/2018   Diabetes mellitus without complication 02/23/2018   Essential hypertension 02/23/2018    Past Surgical History:  Procedure Laterality Date   APPENDECTOMY     BUBBLE STUDY  10/02/2021   Procedure: BUBBLE STUDY;  Surgeon: Pricilla Riffle, MD;  Location: [facility] ENDOSCOPY;  Service: Cardiovascular;;   CRANIOPLASTY Right 02/09/2022    Procedure: Craniectomy with Replacement of Bone Flap From the Abdomen;  Surgeon: Tressie Stalker, MD;  Location: [facility] OR;  Service: Neurosurgery;  Laterality: Right;   CRANIOTOMY Right 09/15/2021   Procedure: RIGHT DECOMPRESSIVE CRANIOTOMY, PLACEMENT OF BONE FLAP IN ABDOMEN;  Surgeon: Tressie Stalker, MD;  Location: [facility] OR;  Service: Neurosurgery;  Laterality: Right;   IR CT HEAD LTD  09/12/2021   IR PERCUTANEOUS ART THROMBECTOMY/INFUSION INTRACRANIAL INC DIAG ANGIO  09/12/2021   IR US GUIDE VASC ACCESS RIGHT  09/12/2021   LAPAROSCOPIC APPENDECTOMY  10/24/2013   Procedure: APPENDECTOMY LAPAROSCOPIC;  Surgeon: Kandis Cocking, MD;  Location: [facility] ORS;  Service: General;;   RADIOLOGY WITH ANESTHESIA N/A 09/12/2021   Procedure: IR WITH ANESTHESIA;  Surgeon: Julieanne Cotton, MD;  Location: [facility] OR;  Service: Radiology;  Laterality: N/A;   TEE WITHOUT CARDIOVERSION N/A 10/02/2021   Procedure: TRANSESOPHAGEAL ECHOCARDIOGRAM (TEE);  Surgeon: Pricilla Riffle, MD;  Location: [facility] ENDOSCOPY;  Service: Cardiovascular;  Laterality: N/A;       Home Medications    Prior to Admission medications   Medication Sig Start Date End Date Taking? Authorizing Provider  amLODipine (NORVASC) 10 MG tablet Take 1 tablet (10 mg total) by mouth daily. 05/29/22  Yes [name], MD  apixaban (ELIQUIS) 5 MG TABS tablet Take 1 tablet (5 mg total) by mouth 2 (two) times daily. 04/13/22  Yes [name], MD  atorvastatin (LIPITOR) 40 MG tablet Take 1 tablet (40 mg total) by mouth daily. 05/13/22  Darlen Round, MD  baclofen (LIORESAL) 10 MG tablet Take 1 tablet (10 mg total) by mouth 3 (three) times daily. 06/24/22  Yes [name], MD  blood glucose meter kit and supplies KIT Dispense based on patient and insurance preference. Use up to four times daily as directed. 04/09/22  Yes [name], MD  cloNIDine (CATAPRES - DOSED IN MG/24 HR) 0.3 mg/24hr patch Place 1 patch (0.3 mg total) onto the skin once a week. 05/14/22  Yes  Eloisa Northern, MD  Continuous Blood Gluc Receiver (DEXCOM G7 RECEIVER) DEVI USE TO CHECK FSBS THREE TIMES DAILY 06/23/22  Yes [name], MD  Continuous Blood Gluc Sensor (DEXCOM G7 SENSOR) MISC Use to check FSBS three times daily 06/19/22  Yes [name], MD  escitalopram (LEXAPRO) 20 MG tablet Take 1.5 tablets (30 mg total) by mouth daily. 05/13/22  Yes Eloisa Northern, MD  feeding supplement (ENSURE ENLIVE / ENSURE PLUS) LIQD Take 237 mLs by mouth 2 (two) times daily between meals. 03/06/22  Yes Setzer, Lynnell Jude, PA-C  glucose blood (RELION TRUE METRIX TEST STRIPS) test strip Use as instructed 06/11/22  Yes [name], MD  insulin glargine (LANTUS) 100 UNIT/ML injection Inject 0.1 mLs (10 Units total) into the skin daily. 04/13/22  Yes [name], MD  insulin lispro (HUMALOG) 100 UNIT/ML KwikPen Inject 0-20 Units into the skin 3 (three) times daily. Inject 0-20 Units into the skin 3 (three) times daily with meals. CBG < 70 call MD. CBG 70-120 give 0 units; 121-150 give 3 units; 151-200 give 4 units, 201-250 give 7 units; 251-300 give 11 units; 301-350 give 15 units; 351-400 give 20 units; greater than 400 call MD 04/09/22  Yes [name], MD  Lancets Ultra Fine MISC Use as directed 04/14/22  Yes [name], MD  lisinopril (ZESTRIL) 40 MG tablet Take 1 tablet (40 mg total) by mouth daily. 05/29/22  Yes [name], MD  metFORMIN (GLUCOPHAGE) 500 MG tablet Take 1 tablet (500 mg total) by mouth 2 (two) times daily with a meal. 06/23/22  Yes Leonia Reader, Barbara Cower, MD  metoprolol tartrate (LOPRESSOR) 25 MG tablet Take 1 tablet (25 mg total) by mouth 2 (two) times daily. 05/13/22  Yes Eloisa Northern, MD  potassium chloride SA (KLOR-CON M) 20 MEQ tablet Take 1 tablet (20 mEq total) by mouth 2 (two) times daily. 04/13/22  Yes [name], MD  traZODone (DESYREL) 50 MG tablet Take 1 tablet (50 mg total) by mouth at bedtime. 05/13/22  Yes Eloisa Northern, MD  acetaminophen (TYLENOL) 325 MG tablet Take 650 mg  by mouth every 12 (twelve) hours as needed for moderate pain.    [provider]  benzonatate (TESSALON) 100 MG capsule Take 1 capsule (100 mg total) by mouth every 8 (eight) hours. 07/15/22   Fayrene Helper, PA-C  oseltamivir (TAMIFLU) 75 MG capsule Take 1 capsule (75 mg total) by mouth every 12 (twelve) hours. 07/15/22   Fayrene Helper, PA-C  sildenafil (VIAGRA) 50 MG tablet Take 1 tablet (50 mg total) by mouth daily as needed for erectile dysfunction. 06/15/22   [name], MD    Family History Family History  Problem Relation Age of Onset   Asthma Son    Stroke Neg Hx     Social History Social History   Tobacco Use   Smoking status: Never   Smokeless tobacco: Never  Vaping Use   Vaping Use: Never used  Substance Use Topics   Alcohol  use: Yes    Alcohol/week: 1.0 standard drink of alcohol    Types: 1 Cans of beer per week    Comment: OCCASIONALLY   Drug use: No     Allergies   Hydrochlorothiazide   Review of Systems Review of Systems  Constitutional: Negative.   HENT: Negative.    Respiratory: Negative.    Cardiovascular: Negative.   Genitourinary:  Negative for decreased urine volume, difficulty urinating, dysuria, enuresis, flank pain, frequency, genital sores, hematuria, penile discharge, penile pain, penile swelling, scrotal swelling, testicular pain and urgency.     Physical Exam Triage Vital Signs ED Triage Vitals  Enc Vitals Group     BP 08/31/22 1659 118/83     Pulse Rate 08/31/22 1659 70     Resp 08/31/22 1659 12     Temp 08/31/22 1659 98.5 F (36.9 C)     Temp Source 08/31/22 1659 Oral     SpO2 08/31/22 1659 96 %     Weight --      Height --      Head Circumference --      Peak Flow --      Pain Score 08/31/22 1657 0     Pain Loc --      Pain Edu? --      Excl. in GC? --    No data found.  Updated Vital Signs BP 118/83 (BP Location: Right Arm)   Pulse 70   Temp 98.5 F (36.9 C) (Oral)   Resp 12   SpO2 96%   Visual  Acuity Right Eye Distance:   Left Eye Distance:   Bilateral Distance:    Right Eye Near:   Left Eye Near:    Bilateral Near:     Physical Exam Constitutional:      Appearance: Normal appearance.  Eyes:     Extraocular Movements: Extraocular movements intact.  Pulmonary:     Effort: Pulmonary effort is normal.  Neurological:     Mental Status: He is alert and oriented to person, place, and time. Mental status is at baseline.      UC Treatments / Results  Labs (all labs ordered are listed, but only abnormal results are displayed) Labs Reviewed  POCT URINALYSIS DIPSTICK, ED / UC - Abnormal; Notable for the following components:      Result Value   Bilirubin Urine SMALL (*)    Ketones, ur TRACE (*)    Hgb urine dipstick LARGE (*)    Protein, ur 100 (*)    All other components within normal limits    EKG   Radiology No results found.  Procedures Procedures (including critical care time)  Medications Ordered in UC Medications - No data to display  Initial Impression / Assessment and Plan / UC Course  I have reviewed the triage vital signs and the nursing notes.  Pertinent labs & imaging results that were available during my care of the patient were reviewed by me and considered in my medical decision making (see chart for details).  Dark urine  Most likely related to poor fluid intake, discussed this with patient, advised increasing fluids with primarily water, urinalysis showing ketones, bilirubin and protein as well as microscopic blood, no signs of infection, has no additional symptoms low suspicion for kidney stone however given signs and discussed that if any new symptoms begin to occur he is to follow-up for reevaluation Final Clinical Impressions(s) / UC Diagnoses   Final diagnoses:  None   Discharge Instructions  None    ED Prescriptions   None    PDMP not reviewed this encounter.   Valinda HoarWhite, Madysin Crisp R, NP 08/31/22 1731

## 2022-08-31 NOTE — ED Triage Notes (Signed)
Pt is concerned about urine being dark x 3-7 days .

## 2022-08-31 NOTE — Therapy (Unsigned)
OUTPATIENT PHYSICAL THERAPY NEURO TREATMENT/ Physical Therapy Progress Note   Dates of reporting period  06/23/22   to   09/01/22    Patient Name: Jacob [name] MRN: [id number] DOB:1976/03/21, 47 y.o., male Today's Date: 09/01/2022   PCP: Dr. [name] REFERRING PROVIDER: Dr. Michel Santee Eyk  END OF SESSION:  PT End of Session - 09/01/22 0859     Visit Number 10    Number of Visits 24    Date for PT Re-Evaluation 09/15/22    Authorization Type [place] [employer]    Progress Note Due on Visit 20    PT Start Time 0900    PT Stop Time 0930    PT Time Calculation (min) 30 min    Equipment Utilized During Treatment Gait belt    Activity Tolerance Patient tolerated treatment well    Behavior During Therapy WFL for tasks assessed/performed                     Past Medical History:  Diagnosis Date   Allergy    Diabetes mellitus without complication    GERD (gastroesophageal reflux disease)    Hypertension    Paralysis    Stroke    Past Surgical History:  Procedure Laterality Date   APPENDECTOMY     BUBBLE STUDY  10/02/2021   Procedure: BUBBLE STUDY;  Surgeon: Pricilla Riffle, MD;  Location: [facility] ENDOSCOPY;  Service: Cardiovascular;;   CRANIOPLASTY Right 02/09/2022   Procedure: Craniectomy with Replacement of Bone Flap From the Abdomen;  Surgeon: Tressie Stalker, MD;  Location: [facility] OR;  Service: Neurosurgery;  Laterality: Right;   CRANIOTOMY Right 09/15/2021   Procedure: RIGHT DECOMPRESSIVE CRANIOTOMY, PLACEMENT OF BONE FLAP IN ABDOMEN;  Surgeon: Tressie Stalker, MD;  Location: [facility] OR;  Service: Neurosurgery;  Laterality: Right;   IR CT HEAD LTD  09/12/2021   IR PERCUTANEOUS ART THROMBECTOMY/INFUSION INTRACRANIAL INC DIAG ANGIO  09/12/2021   IR US GUIDE VASC ACCESS RIGHT  09/12/2021   LAPAROSCOPIC APPENDECTOMY  10/24/2013   Procedure: APPENDECTOMY LAPAROSCOPIC;  Surgeon: Kandis Cocking, MD;  Location: [facility] ORS;  Service: General;;   RADIOLOGY WITH ANESTHESIA N/A 09/12/2021    Procedure: IR WITH ANESTHESIA;  Surgeon: Julieanne Cotton, MD;  Location: [facility] OR;  Service: Radiology;  Laterality: N/A;   TEE WITHOUT CARDIOVERSION N/A 10/02/2021   Procedure: TRANSESOPHAGEAL ECHOCARDIOGRAM (TEE);  Surgeon: Pricilla Riffle, MD;  Location: [facility] ENDOSCOPY;  Service: Cardiovascular;  Laterality: N/A;   Patient Active Problem List   Diagnosis Date Noted   Mild episode of recurrent major depressive disorder 06/15/2022   Type 2 diabetes mellitus with hyperglycemia, with long-term current use of insulin    Debility 02/14/2022   Status post craniectomy 02/09/2022   Memory deficit following cerebral infarction 02/07/2022   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 01/19/2022   Pneumonia of both lower lobes due to methicillin resistant Staphylococcus aureus (MRSA)    Cerebral embolism with cerebral infarction 09/20/2021   Hypernatremia 09/19/2021   Urinary retention 09/18/2021   Fever 09/18/2021   Acute respiratory failure with hypoxia    Cerebral edema 09/15/2021   Acute ischemic stroke 09/12/2021   Dysphagia following cerebral infarction 05/25/2021   Vitamin D deficiency 11/04/2020   Hypertensive urgency 10/31/2020   Hyperlipidemia 03/28/2018   Diabetes mellitus without complication 02/23/2018   Essential hypertension 02/23/2018    ONSET DATE: 08/2021  REFERRING DIAG:  Diagnosis  I63.9 (ICD-10-CM) - Cerebral infarction, unspecified  R53.81 (ICD-10-CM) -  Other malaise    THERAPY DIAG:  Muscle weakness (generalized)  Abnormality of gait and mobility  Difficulty in walking, not elsewhere classified  Unsteadiness on feet  Rationale for Evaluation and Treatment: Rehabilitation  SUBJECTIVE:                                                                                                                                                                                             SUBJECTIVE STATEMENT: Patient presents with his wife. Pt reports he has  been feeling better since urgent care/ ED visit and his urine color has returned to normal.    Pt accompanied by: significant other- Wife  PERTINENT HISTORY: Patient is a 47 year old male with recent history of CVA  in April 2023 and then elective cranioplasty in September 2023 with subsequent inpatient rehab and then home health services. Patient has left hemiparesis.   PAIN:  Are you having pain? No  PRECAUTIONS: Fall  WEIGHT BEARING RESTRICTIONS: No  FALLS: Has patient fallen in last 6 months? Yes. 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   RADIATION DOSE REDUCTION: This exam was performed according to the departmental dose-optimization program which includes automated exposure control, adjustment of the mA and/or kV according to patient size and/or use of iterative reconstruction technique.   COMPARISON:  09/19/2021   FINDINGS: Brain: Diffuse cytotoxic edema is noted throughout the distribution of the right MCA and PCA watershed region similar to that seen on the prior exam. No parenchymal hemorrhage is seen. No new focal infarct is seen.    Vascular: No hyperdense vessel or unexpected calcification.   Skull: Large calvarial defect there is noted on the right stable from the prior study.   Sinuses/Orbits: Paranasal sinuses are within normal limits.   Other: Gastric catheter and feeding catheter are noted  extending through the nose.   IMPRESSION: Right MCA/PCA infarct stable in appearance from the previous exam. No acute hemorrhage is noted.   Right-sided hemi craniectomy stable from the prior exam. No new focal abnormality is noted.     Electronically Signed   By: Alcide Clever M.D.   On: 09/24/2021 02:47  COGNITION: Overall cognitive status: Impaired-decreased awareness of functional limitations   SENSATION: Light touch: Impaired - decreased  COORDINATION:  Left side hemiparesis  EDEMA:  None observed  MUSCLE TONE: LLE: Hypotonic   POSTURE: rounded shoulders and forward head  LOWER EXTREMITY ROM:     Active  Right Eval Left Eval  Hip flexion    Hip extension    Hip abduction    Hip adduction    Hip internal rotation    Hip external rotation    Knee flexion    Knee extension    Ankle dorsiflexion    Ankle plantarflexion    Ankle inversion    Ankle eversion     (Blank rows = not tested)  LOWER EXTREMITY MMT:    MMT Right Eval Left Eval  Hip flexion 5 2-  Hip extension 5 2-  Hip abduction 5 2-  Hip adduction 5 2-  Hip internal rotation 5 2-  Hip external rotation 5 2-  Knee flexion 5 2-  Knee extension 5 2-  Ankle dorsiflexion 5  0   Ankle plantarflexion    Ankle inversion    Ankle eversion    (Blank rows = not tested)  BED MOBILITY:  Not tested  TRANSFERS: Assistive device utilized: Hemi walker  Sit to stand: Mod A Stand to sit: Mod A Chair to chair: Mod A Floor:  Not tested   STAIRS: *Patient adamantly wanted to attempt today Level of Assistance: Max A Stair Negotiation Technique: Step to Pattern with Bilateral Rails Number of Stairs: 1  Height of Stairs:  6"  Comments: Patient wanted to attempt steps continuously requesting to try- He was able to stand from w/c with mod assist using right arm for support on armrest of chair with gait belt. Once in standing he was able to step up with right LE with physical assist to block his left LE yet able to flex left hip/knee enough to clear 1st step- positioned back in chair.   GAIT: Gait pattern: step to pattern Distance walked: 8 feet Assistive device utilized: Hemi walker Level of assistance: Mod A Comments: Patient required VC for seqencing and use of left AFO today. - fatigues very quickly and left knee buckling with weight bearing.   FUNCTIONAL TESTS:  5 times sit to stand: Patient required mod Assist to stand and unable to complete on his own.  PATIENT SURVEYS:  FOTO 36; predicted goal= 47  TODAY'S TREATMENT:  DATE: 09/01/22 TE  Physical therapy treatment session today consisted of completing assessment of goals and administration of testing as demonstrated and documented in flow sheet, treatment, and goals section of this note. Addition treatments may be found below.   Ambulation x 25 ft with hemiwalker, CGA and WC follow. Cues with pt wife regarding proper guarding strategy for home use. Cues for proper use of hemiwalker.    Pt session was cut a little short today due to pt arriving a few minutes late to scheduled appointment time      PATIENT EDUCATION: Education details: PT plan of care and HEP Person educated: Patient and Spouse Education method: Explanation, Demonstration, Tactile cues, Verbal cues, and Handouts Education comprehension: verbalized understanding, returned demonstration, verbal cues required, tactile cues required, and needs further education  HOME EXERCISE PROGRAM: Access Code: [email or web address] URL: [email or web address] Date:  06/23/2022 Prepared by: Maureen Ralphs  Exercises - Supine Quad Set  - 1 x daily - 7 x weekly - 3 sets - 10 reps - Seated Long Arc Quad  - 1 x daily - 7 x weekly - 3 sets - 10 reps - Seated March  - 1 x daily - 7 x weekly - 3 sets - 10 reps - Supine Bridge  - 1 x daily - 7 x weekly - 3 sets - 10 reps - Sit to Stand with Counter Support  - 1 x daily - 7 x weekly - 3 sets - 10 reps  GOALS: Goals reviewed with patient? Yes  SHORT TERM GOALS: Target date: 08/04/2022  Pt will be independent with HEP in order to improve strength and balance in order to decrease fall risk and improve function at home and work.  Baseline: EVAL-Patient with no formal HEP in place 09/01/22: Doing every couple days  Goal status: IN PROGRESS  2.  Patient will perform all stand pivot transfers with min Assist using hemi-walker for improved functional independence with mobility in home.  Baseline: EVAL= Mod A with transfers 4/9: min A but not quite stand pivot ( increased time)  Goal status: IN PROGRESS    LONG TERM GOALS: Target date: 09/15/2022  Pt will improve FOTO to target score of 47  to display perceived improvements in ability to complete ADL's.  Baseline: EVAL= 36 4/9: 44 Goal status: IN PROGRESS  2.  Pt will achieve 5x STS in less than 60 seconds in order to demonstrate clinically significant improvement in LE strength. Baseline: EVAL- Mod A with sit to stand transfer Goal status: MET  3.  Patient will ambulate > 50 feet with hemiwalker using step to gait and CGA for improved household mobility.  Baseline: EVAL- 8 feet with hemiwalker and mod A 09/01/22: 24.5 ft feet with Min A at times and hemi walker and not using hemi walker with good form ( hemi walker ant to pat and pt stepping into it ea step.  Goal status: INITIAL  4.  Patient will perform all transfers with CGA  including w/c to various surfaces- toilet, bed, chairs using hemiwalker for improved functional independence with mobility.    Baseline:  EVAL- Mod asst with all transfers 4/9: Min A with WC to Standard Chair transfer.  Goal status: INITIAL   ASSESSMENT:  CLINICAL IMPRESSION:  Pt presents to PT for progress note this session. All goals assessed and some goals met at this time.  Patient meets his 5 times sit to stand goal demonstrating ability to transition from sit to stand with upper extremity assist with  hemiwalker for standing balance demonstrating great progress from initial visit where he was unable to transition to standing without moderate assistance from therapist.  Patient also makes progress with his ambulation and ambulating 25 feet with hemiwalker compared to 8 feet with hemiwalker and also ambulating with less assistance.  However, patient does not utilize proper form with hemiwalker without cues and puts hemiwalker anterior to him posing risk for falls and tripping.  Following cues and following goal assessment patient was able to demonstrate improved form with this task.  Patient also makes progress with focus on therapeutic outcomes survey indicating improved subjective level of function with everyday activities around his home.Patient's condition has the potential to improve in response to therapy. Maximum improvement is yet to be obtained. The anticipated improvement is attainable and reasonable in a generally predictable time.  Pt will continue to benefit from skilled physical therapy intervention to address impairments, improve QOL, and attain therapy goals.        OBJECTIVE IMPAIRMENTS: Abnormal gait, cardiopulmonary status limiting activity, decreased activity tolerance, decreased balance, decreased cognition, decreased coordination, decreased endurance, decreased mobility, difficulty walking, decreased strength, decreased safety awareness, impaired perceived functional ability, impaired sensation, and impaired UE functional use.   ACTIVITY LIMITATIONS: carrying, lifting, bending, standing, squatting,  stairs, transfers, and bed mobility  PARTICIPATION LIMITATIONS: meal prep, cleaning, laundry, driving, shopping, community activity, occupation, and yard work  PERSONAL FACTORS: Time since onset of injury/illness/exacerbation and 1-2 comorbidities: HTN, DM  are also affecting patient's functional outcome.   REHAB POTENTIAL: Good  CLINICAL DECISION MAKING: Evolving/moderate complexity  EVALUATION COMPLEXITY: Moderate  PLAN:  PT FREQUENCY: 1-2x/week  PT DURATION: 12 weeks  PLANNED INTERVENTIONS: Therapeutic exercises, Therapeutic activity, Neuromuscular re-education, Balance training, Gait training, Patient/Family education, Self Care, Joint mobilization, Stair training, Orthotic/Fit training, DME instructions, Dry Needling, Electrical stimulation, Wheelchair mobility training, Spinal mobilization, Cryotherapy, Moist heat, Splintting, Taping, and Manual therapy  PLAN FOR NEXT SESSION: Review and progress LE strengthening. Transfer training for safe mobility, Gait training in // bars or with hemiwalker and close follow with w/c. Add to HEP as appropriate.    Norman Herrlichhristopher B Marialuisa Basara, PT 09/01/2022, 9:00 AM

## 2022-08-31 NOTE — Discharge Instructions (Addendum)
Today your dark urine has been evaluated  Urinalysis does not show any signs of infection, able to see some microscopic blood which is not harmful, able to see elevation in protein and ketones in your urine which is a sign that you need to drink more water  Currently use that you are drinking about 20 ounces of water a day, try to drink 2-3 of your water bottles a day and monitor your urine color  If you urine changes to a red color, you begin to have abdominal pain, back pain, fevers, nausea or vomiting please return for reevaluation as these are signs of a kidney stone

## 2022-09-01 ENCOUNTER — Ambulatory Visit: Payer: Medicaid Other | Admitting: Physical Therapy

## 2022-09-01 ENCOUNTER — Encounter: Payer: Self-pay | Admitting: Physical Therapy

## 2022-09-01 ENCOUNTER — Ambulatory Visit: Payer: Medicaid Other

## 2022-09-01 DIAGNOSIS — R269 Unspecified abnormalities of gait and mobility: Secondary | ICD-10-CM

## 2022-09-01 DIAGNOSIS — R278 Other lack of coordination: Secondary | ICD-10-CM

## 2022-09-01 DIAGNOSIS — R262 Difficulty in walking, not elsewhere classified: Secondary | ICD-10-CM

## 2022-09-01 DIAGNOSIS — M6281 Muscle weakness (generalized): Secondary | ICD-10-CM

## 2022-09-01 DIAGNOSIS — R2681 Unsteadiness on feet: Secondary | ICD-10-CM

## 2022-09-01 NOTE — Therapy (Signed)
OUTPATIENT OCCUPATIONAL THERAPY NEURO PROGRESS AND TREATMENT Reporting period beginning 06/18/22-09/02/22 Patient Name: Jacob [name] MRN: [id number] DOB:05-15-1976, 47 y.o., male Today's Date: 09/03/2022  PCP: Dr. [name] REFERRING PROVIDER: Dr. Michel Santee Eyk  END OF SESSION:  OT End of Session - 09/03/22 1910     Visit Number 10    Number of Visits 24    Date for OT Re-Evaluation 09/10/22    Progress Note Due on Visit 10    OT Start Time 0930    OT Stop Time 1015    OT Time Calculation (min) 45 min    Equipment Utilized During Treatment wc    Activity Tolerance Patient tolerated treatment well    Behavior During Therapy WFL for tasks assessed/performed             Past Medical History:  Diagnosis Date   Allergy    Diabetes mellitus without complication    GERD (gastroesophageal reflux disease)    Hypertension    Paralysis    Stroke    Past Surgical History:  Procedure Laterality Date   APPENDECTOMY     BUBBLE STUDY  10/02/2021   Procedure: BUBBLE STUDY;  Surgeon: Pricilla Riffle, MD;  Location: [facility] ENDOSCOPY;  Service: Cardiovascular;;   CRANIOPLASTY Right 02/09/2022   Procedure: Craniectomy with Replacement of Bone Flap From the Abdomen;  Surgeon: Tressie Stalker, MD;  Location: [facility] OR;  Service: Neurosurgery;  Laterality: Right;   CRANIOTOMY Right 09/15/2021   Procedure: RIGHT DECOMPRESSIVE CRANIOTOMY, PLACEMENT OF BONE FLAP IN ABDOMEN;  Surgeon: Tressie Stalker, MD;  Location: [facility] OR;  Service: Neurosurgery;  Laterality: Right;   IR CT HEAD LTD  09/12/2021   IR PERCUTANEOUS ART THROMBECTOMY/INFUSION INTRACRANIAL INC DIAG ANGIO  09/12/2021   IR US GUIDE VASC ACCESS RIGHT  09/12/2021   LAPAROSCOPIC APPENDECTOMY  10/24/2013   Procedure: APPENDECTOMY LAPAROSCOPIC;  Surgeon: Kandis Cocking, MD;  Location: [facility] ORS;  Service: General;;   RADIOLOGY WITH ANESTHESIA N/A 09/12/2021   Procedure: IR WITH ANESTHESIA;  Surgeon: Julieanne Cotton, MD;  Location: [facility] OR;  Service:  Radiology;  Laterality: N/A;   TEE WITHOUT CARDIOVERSION N/A 10/02/2021   Procedure: TRANSESOPHAGEAL ECHOCARDIOGRAM (TEE);  Surgeon: Pricilla Riffle, MD;  Location: [facility] ENDOSCOPY;  Service: Cardiovascular;  Laterality: N/A;   Patient Active Problem List   Diagnosis Date Noted   Mild episode of recurrent major depressive disorder 06/15/2022   Type 2 diabetes mellitus with hyperglycemia, with long-term current use of insulin    Debility 02/14/2022   Status post craniectomy 02/09/2022   Memory deficit following cerebral infarction 02/07/2022   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 01/19/2022   Pneumonia of both lower lobes due to methicillin resistant Staphylococcus aureus (MRSA)    Cerebral embolism with cerebral infarction 09/20/2021   Hypernatremia 09/19/2021   Urinary retention 09/18/2021   Fever 09/18/2021   Acute respiratory failure with hypoxia    Cerebral edema 09/15/2021   Acute ischemic stroke 09/12/2021   Dysphagia following cerebral infarction 05/25/2021   Vitamin D deficiency 11/04/2020   Hypertensive urgency 10/31/2020   Hyperlipidemia 03/28/2018   Diabetes mellitus without complication 02/23/2018   Essential hypertension 02/23/2018    ONSET DATE: April 2023  REFERRING DIAG: I63.9 (ICD-10-CM) - Cerebral infarction, unspecified   THERAPY DIAG:   Muscle weakness (generalized)  Other lack of coordination  Rationale for Evaluation and Treatment: Rehabilitation  SUBJECTIVE: Spouse reports that pt got his lap tray for his wc, but they left it  at home today.  Spouse reports pt doesn't like it.  OT requested pt/spouse bring tray to therapy next session for OT to observe pt's positioning with lap tray in place. SUBJECTIVE STATEMENT: Pt accompanied by: self and significant other  PERTINENT HISTORY: Per chart, Jacob [name] is a 47 y.o. male with a history of prior CVA with decompressive craniectomy in April 2023.  He has a history of right peroneal DVT  on Eliquis.  He has left hemiparesis from stroke.   Per spouse, pt spent some time in [facility], then 2 months at [facility] (SNF). He presented for elective cranioplasty in Sept 2023.  Pt was admitted to rehab 02/14/2022 for inpatient therapies following cranioplasty, and participated in [facility] therapies until recently.      PRECAUTIONS: Fall  WEIGHT BEARING RESTRICTIONS: No  PAIN:  Are you having pain? Yes: NPRS scale: 0 (rest), when stretching 2/10 Pain location: L shoulder sublux Pain description: achy Aggravating factors: movement of the L shoulder  Relieving factors: lying down and gentle stretching   FALLS: Has patient fallen in last 6 months? Yes. Number of falls 1  LIVING ENVIRONMENT: Lives with: lives with their spouse and 49 y/o son, 1 cousin, and 1 friend Lives in: 2 level with master bedroom on the main level, pt resides only on 1st level Stairs: 1 step no rail to enter, pt does have small ramp Has following equipment at home: Hemi walker, Wheelchair (manual), Shower bench, bed side commode, Grab bars, Ramped entry, and platform walker (grab bar by the toilet but not in the shower.  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  PATIENT GOALS: Pt wants to get back to walking and being as indep as possible.   OBJECTIVE:   HAND DOMINANCE: Right  ADLs: Overall ADLs: HHA Mon-Fri 5 hours/day; spouse is pt's primary caregiver Transfers/ambulation related to ADLs: min A sit to stand pivot from wc using gait belt  Eating: assist to cut food  Grooming: min A to manage facial hair UB Dressing: max A LB Dressing: max-dep A Toileting: dep, wears adult briefs.  Spouse reports pt has just started transitioning to use of BSC over the toilet vs voiding in a brief Bathing: max A with sponge/bed bath (in the process of getting bathroom re-done to enable pt to take a shower) Tub Shower transfers: N/A at this time;  pt states he doesn't trust himself with the glass doors. Equipment:  see above  IADLs: Shopping: dep Light housekeeping: dep Meal Prep: dep Community mobility: wc dep  Medication management: dep Financial management: dep Handwriting:  Pt is R hand dominant, NT  MOBILITY STATUS:  Pt reports he can amb with hemi-walker 10-20 ft with max A (per spouse) and someone following with a wc behind him  POSTURE COMMENTS:  rounded shoulders, forward head, and L sided hemiplegia (L shoulder subluxation ~1 thumb breadth) Sitting balance:  able to sit edge of mat and tolerate moderate perturbation from all directions.  Pt can reach forward to ankles and return to midline without LOB.  FUNCTIONAL OUTCOME MEASURES: FOTO: 20; predicted 29 09/01/22: 19  UPPER EXTREMITY ROM:    Passive ROM Right Eval (A/PROMWNL) Left eval Left 09/01/22  Shoulder flexion  78 90  Shoulder abduction  60 68  Shoulder adduction     Shoulder extension     Shoulder internal rotation     Shoulder external rotation  -10 (elbow at side) -  10  Elbow flexion     Elbow extension     Wrist flexion  70 80  Wrist extension  48 58  Wrist ulnar deviation     Wrist radial deviation     Wrist pronation  90 90  Wrist supination  60 82  (Blank rows = not tested)  09/01/22: L hand passively opens ~75% of full extension d/t PIP and MP tightness in all digits.  UPPER EXTREMITY MMT:     MMT Right eval Left eval Left  09/01/22  Shoulder flexion 5 0 1  Shoulder abduction 5 0 1  Shoulder adduction     Shoulder extension     Shoulder internal rotation     Shoulder external rotation     Middle trapezius     Lower trapezius     Elbow flexion 5 0 1  Elbow extension 5 0 1  Wrist flexion 5 0 1  Wrist extension 5 0 1  Wrist ulnar deviation     Wrist radial deviation     Wrist pronation  0 1  Wrist supination  0 1  (Blank rows = not tested)  HAND FUNCTION: Grip strength: Right: 95 lbs; Left: NT lbs, Lateral pinch: Right: 25  lbs, Left: NT lbs, and 3 point pinch: Right: 20 lbs, Left: NT lbs 09/01/22: L grip/pinch NT (unable)  COORDINATION: 9 Hole Peg test: Right: NT sec; Left: unable sec 09/01/22: L NT (unable)  SENSATION: LUE: Light touch: Impaired  Proprioception: Impaired   EDEMA: None  MUSCLE TONE: LUE: Severe and Hypertonic sublux 1 thumb breadth  COGNITION: Overall cognitive status:  Decreased awareness of deficits  VISION: Subjective report: Pt reports decreased peripheral vision on the L Baseline vision: No visual deficits Visual history: N/A  VISION ASSESSMENT: Tracking/Visual pursuits: Decreased smoothness of eye movement to Left superior field, Decreased smoothness of eye movement to Left inferior field, and Requires cues, head turns, or add eye shifts to track Saccades: additional eye shifts occurred during testing and additional head turns occurred during testing Visual Fields: Left homonymous hemianopsia  PERCEPTION: Not tested, will assess in functional contexts with additional visits  PRAXIS: Impaired: Motor planning  OBSERVATIONS: L hand rests in flexion.  Noted slight odor originating from palm/between fingers.  Skin on volar hand appears to have some yeast.  Encouraged pt/spouse bring hand splint to all sessions to work towards opening up hand for improving skin integrity.  Spouse will bring next week.  TODAY'S TREATMENT:            Therapeutic Exercise:  Objective measures taken and goals updated for progress note. Pt. tolerated PROM throughout the LUE, including all ranges for the L shoulder, elbow, forearm, wrist flex/ext, and digit flex/ext.  Reviewed self ROM for the shoulder, elbow, forearm, wrist, and hand, with pt required min verbal cues and demo for optimal technique.  Self Care: Practiced doffing sweatshirt; supv with extra time.  Able to don with extra time and min A to pull shirt down behind back and push L sleeve above elbow.  Reviewed bed rail recommendation as pt  would like to transition away from using his hospital bed and return to his own bed.  Pt/spouse report that pt's regular bed in 29" high, but this may change as they plan to obtain a new mattress which would elevate at the head and feet.    PATIENT EDUCATION: Education details: LUE PROM, progress towards goals Person educated: Patient and Spouse Education method: Explanation and Verbal cues,  tactile cues, demo, (handout at home) Education comprehension: verbalized understanding and needs further education  HOME EXERCISE PROGRAM: PROM throughout the LUE  GOALS: Goals reviewed with patient? Yes  SHORT TERM GOALS: Target date: 07/30/22 (6 weeks)  Pt/caregivers will demo indep with HEP for reducing pain and stiffness throughout the LUE. Baseline: Not yet initiated; 09/01/22: Pt performs self passive stretching daily throughout the LUE, but caregiver is needed to meet end ranges and min vc for optimal technique.  Spouse reports pt has new caregivers and they are not yet performing ROM, but spouse plans to develop a schedule.  Goal status: ongoing  2.  Pt/caregivers will demo supportive positioning techniques with min vc to support L hemiparetic limb. Baseline: Educ not yet initiated; 09/01/22: Pt has just received his wc lap tray but he does not like it, per spouse.  Pt is inconsistent to use supportive positioning via other methods that have been recommended, including use of pillows to support L shoulder sublux.  Pt does tuck his L hand into gait belt while working with PT during mobility and spouse reports that pt positions L arm on arm rest of couch at home. Goal status: ongoing  3.  Pt/caregivers will follow L hand splinting schedule with at last 50% compliance in order to reduce contracture risk and to optimize skin integrity of L hand. Baseline: Not yet initiated; 09/01/22: <50% compliance; pt reports inconsistent wearing time and splint was not brought to therapy session today.  OT encouraged pt  bring to each session for OT to assess need for splinting adjustments as needed.   Goal status: ongoing   LONG TERM GOALS: Target date: 09/10/22 (12 weeks)  Pt will increase FOTO score to 29 or more to indicate increased indep with daily tasks.   Baseline: Eval: 20 (predicted 29); 09/01/22: 19 Goal status: ongoing  2.  Pt will transfer wc<>BSC with min guard Baseline: min-mod A; 09/01/22 min A consistently, wc to couch or wc to bed with supv Goal status: ongoing  3.  Pt will don/doff shirt with min A Baseline: Mod-max A; 09/01/22: supv to doff sweatshirt, min A to don Goal status: achieved  4.  Pt will manage clothing for toileting with min A Baseline: max A-dep; 08/31/12: mod A  Goal status: ongoing  5.  Pt will increase L shoulder strength by 2 MM grade to assist with repositioning L upper arm away from side for underarm care and donning shirt. Baseline: L shoulder 0/5; 09/01/22: L shoulder 1/5 (but must use R arm to reposition the L) Goal status: ongoing, (revised from 1 MM grade to 2 MM grades)  6.  Through manual therapy, neuro re-ed, and therapeutic exercises, pt will report 1/10 pain or less throughout the LUE to increase tolerance for engaging the LUE into ADLs.   Baseline: LUE 5/10 pain, 09/01/22: Pt reports 2/10 pain throughout the LUE Goal status: ongoing (revised from 3/10 pain or less to 1/10 pain or less)  7.  Pt will perform supine<>sit bed transfer with supv and AE as needed into his queen size bed. Baseline: Mod A into hospital bed; modified indep with bed rail and trapeze into hospital bed, but pt wants to transition to his queen size bed and has not yet attempted this transfer.  Spouse reports that they may plan to order a bed rail.   Goal status: ongoing  ASSESSMENT:  CLINICAL IMPRESSION: Pt seen for 10th visit OT progress update.  Though FOTO score shows no significant improvement this  period, pt is showing gains towards majority of OT goals.  LUE shows increased passive  range at the shoulder, forearm, and wrist, and pain has decreased from 5/10 to 2/10 pain when the LUE is engaged into activity.  Spouse reports much better tolerance to bathing/dressing when the arm is repositioned.  Though L shoulder, wrist, and hand remain stiff, flexibility has improved which in turn has allowed more potential for attempts at active movement.  Noting improvement from 0/5 to 1/5 MM grade throughout the LUE.  Pt and spouse have been instructed in PROM techniques throughout the LUE and pt has a handout in the home.  Pt is making attempts at increasing his participation in basic self care, and can now don his shirt with min A, doff with supv/extra time, can transfer on/off BSC with min A, and transfer from wc to the bed or couch with supv.  Pt also reports he made a good attempt at donning his pants the other day, but found himself with 2 legs in 1 pant leg.  Pt is not yet consistent with optimal positioning strategies to prevent L hand flexion contractures, or supportive positioning to manage L shoulder sublux.  OT continues to reinforce importance of positioning to manage pain and reduce contracture risk.  Pt did not bring in his hand splint today or his new lap tray, but will plan to bring next session for OT to assess LUE positioning with both items.  Pt will continue to benefit from skilled OT to work towards optimal positioning of the LUE for contracture prevention, maximize ROM throughout the LUE for ADLs, decrease pain, maximize engagement of the LUE into daily tasks, as well work to maximize level of indep with daily tasks.   PERFORMANCE DEFICITS: in functional skills including ADLs, IADLs, coordination, dexterity, proprioception, sensation, tone, ROM, strength, pain, flexibility, Fine motor control, Gross motor control, mobility, balance, body mechanics, decreased knowledge of use of DME, skin integrity, vision, and UE functional use, cognitive skills including perception and safety  awareness, and psychosocial skills including coping strategies and routines and behaviors.   IMPAIRMENTS: are limiting patient from ADLs, IADLs, leisure, and social participation.   CO-MORBIDITIES: may have co-morbidities  that affects occupational performance. Patient will benefit from skilled OT to address above impairments and improve overall function.  MODIFICATION OR ASSISTANCE TO COMPLETE EVALUATION: No modification of tasks or assist necessary to complete an evaluation.  OT OCCUPATIONAL PROFILE AND HISTORY: Problem focused assessment: Including review of records relating to presenting problem.  CLINICAL DECISION MAKING: Moderate - several treatment options, min-mod task modification necessary  REHAB POTENTIAL: Good  EVALUATION COMPLEXITY: High    PLAN:  OT FREQUENCY: 2x/week  OT DURATION: 12 weeks  PLANNED INTERVENTIONS: self care/ADL training, therapeutic exercise, therapeutic activity, neuromuscular re-education, manual therapy, passive range of motion, balance training, functional mobility training, splinting, electrical stimulation, moist heat, cryotherapy, patient/family education, cognitive remediation/compensation, visual/perceptual remediation/compensation, psychosocial skills training, coping strategies training, and DME and/or AE instructions  RECOMMENDED OTHER SERVICES: N/A  CONSULTED AND AGREED WITH PLAN OF CARE: Patient and family member/caregiver  PLAN FOR NEXT SESSION: see plan  Danelle Earthly, MS, OTR/L  09/03/2022, 7:13 PM

## 2022-09-03 ENCOUNTER — Ambulatory Visit: Payer: Medicaid Other | Admitting: Physical Therapy

## 2022-09-06 ENCOUNTER — Other Ambulatory Visit: Payer: Self-pay | Admitting: Internal Medicine

## 2022-09-07 ENCOUNTER — Encounter: Payer: Self-pay | Admitting: Internal Medicine

## 2022-09-07 ENCOUNTER — Ambulatory Visit: Payer: Medicaid Other | Admitting: Internal Medicine

## 2022-09-07 VITALS — BP 118/80 | HR 64 | Temp 98.3°F | Resp 16 | Ht 73.0 in | Wt 212.8 lb

## 2022-09-07 DIAGNOSIS — N390 Urinary tract infection, site not specified: Secondary | ICD-10-CM | POA: Diagnosis not present

## 2022-09-07 LAB — POCT URINALYSIS DIPSTICK
Bilirubin, UA: NEGATIVE
Blood, UA: NEGATIVE
Glucose, UA: NEGATIVE
Ketones, UA: NEGATIVE
Leukocytes, UA: NEGATIVE
Nitrite, UA: POSITIVE
Protein, UA: NEGATIVE
Spec Grav, UA: 1.025 (ref 1.010–1.025)
Urobilinogen, UA: 1 E.U./dL
pH, UA: 5.5 (ref 5.0–8.0)

## 2022-09-07 MED ORDER — CEFUROXIME AXETIL 250 MG PO TABS: 250.0000 mg | ORAL_TABLET | Freq: Two times a day (BID) | ORAL | 0 refills | Status: AC

## 2022-09-07 NOTE — Addendum Note (Signed)
Addended byOda Cogan on: 09/07/2022 11:57 AM   Modules accepted: Orders

## 2022-09-07 NOTE — Progress Notes (Signed)
Office Visit  Subjective   Patient ID: Jacob [name]   DOB: 12/14/1975   Age: 47 y.o.   MRN: [id number]   Chief Complaint Chief Complaint  Patient presents with   Follow-up    From Urgent Care     History of Present Illness Mr. Erman is a 47 yo male who comes in today for urgent followup where he was seen on 08/31/2022 at [facility] for what they said was dysuria.  Today., the patient states he had dark colored urine for the last 10-11 days. He admits to me he was not drinking as much fluid.  In the urgent care, he did not have a UTI but there was blood in his urine.  He denied any frequency, urgency, dysuria, lower abdominal pain or pressure, flank pain, fevers.  They asked him to hydrate himself.  He did present multiple times to urgent care during this time due to him not being able to urinate while in the clinic where he has a problem with this due to his stroke.  Today, there is no changes.  He denies any blood in his urine.     Past Medical History Past Medical History:  Diagnosis Date   Allergy    Diabetes mellitus without complication    GERD (gastroesophageal reflux disease)    Hypertension    Paralysis    Stroke      Allergies Allergies  Allergen Reactions   Hydrochlorothiazide Other (See Comments)    Bilateral muscle cramping     Medications  Current Outpatient Medications:    acetaminophen (TYLENOL) 325 MG tablet, Take 650 mg by mouth every 12 (twelve) hours as needed for moderate pain., Disp: , Rfl:    amLODipine (NORVASC) 10 MG tablet, Take 1 tablet (10 mg total) by mouth daily., Disp: 90 tablet, Rfl: 1   apixaban (ELIQUIS) 5 MG TABS tablet, Take 1 tablet (5 mg total) by mouth 2 (two) times daily., Disp: 60 tablet, Rfl:    atorvastatin (LIPITOR) 40 MG tablet, Take 1 tablet (40 mg total) by mouth daily., Disp: 90 tablet, Rfl: 1   baclofen (LIORESAL) 10 MG tablet, Take 1 tablet (10 mg total) by mouth 3 (three) times daily., Disp: 90 each, Rfl: 3    benzonatate (TESSALON) 100 MG capsule, Take 1 capsule (100 mg total) by mouth every 8 (eight) hours., Disp: 21 capsule, Rfl: 0   blood glucose meter kit and supplies KIT, Dispense based on patient and insurance preference. Use up to four times daily as directed., Disp: 1 each, Rfl: 0   cloNIDine (CATAPRES - DOSED IN MG/24 HR) 0.3 mg/24hr patch, Place 1 patch (0.3 mg total) onto the skin once a week., Disp: 4 patch, Rfl: 6   Continuous Blood Gluc Receiver (DEXCOM G7 RECEIVER) DEVI, USE TO CHECK FSBS THREE TIMES DAILY, Disp: 1 each, Rfl: 1   Continuous Blood Gluc Sensor (DEXCOM G7 SENSOR) MISC, Use to check FSBS three times daily, Disp: 1 each, Rfl: 3   escitalopram (LEXAPRO) 20 MG tablet, Take 1.5 tablets (30 mg total) by mouth daily., Disp: 45 tablet, Rfl: 2   feeding supplement (ENSURE ENLIVE / ENSURE PLUS) LIQD, Take 237 mLs by mouth 2 (two) times daily between meals., Disp: 237 mL, Rfl: 12   glucose blood (RELION TRUE METRIX TEST STRIPS) test strip, Use as instructed, Disp: 100 each, Rfl: 12   insulin glargine (LANTUS) 100 UNIT/ML injection, Inject 0.1 mLs (10 Units total) into the skin daily., Disp: 10  mL, Rfl: 0   insulin lispro (HUMALOG) 100 UNIT/ML KwikPen, Inject 0-20 Units into the skin 3 (three) times daily. Inject 0-20 Units into the skin 3 (three) times daily with meals. CBG < 70 call MD. CBG 70-120 give 0 units; 121-150 give 3 units; 151-200 give 4 units, 201-250 give 7 units; 251-300 give 11 units; 301-350 give 15 units; 351-400 give 20 units; greater than 400 call MD, Disp: 15 mL, Rfl: 1   Lancets Ultra Fine MISC, Use as directed, Disp: 100 each, Rfl: 11   lisinopril (ZESTRIL) 40 MG tablet, Take 1 tablet (40 mg total) by mouth daily., Disp: 90 tablet, Rfl: 1   metFORMIN (GLUCOPHAGE) 500 MG tablet, Take 1 tablet (500 mg total) by mouth 2 (two) times daily with a meal., Disp: 180 tablet, Rfl: 1   metoprolol tartrate (LOPRESSOR) 25 MG tablet, Take 1 tablet (25 mg total) by mouth 2 (two)  times daily., Disp: 60 tablet, Rfl: 11   oseltamivir (TAMIFLU) 75 MG capsule, Take 1 capsule (75 mg total) by mouth every 12 (twelve) hours., Disp: 10 capsule, Rfl: 0   potassium chloride SA (KLOR-CON M) 20 MEQ tablet, Take 1 tablet (20 mEq total) by mouth 2 (two) times daily., Disp: 28 tablet, Rfl: 0   sildenafil (VIAGRA) 50 MG tablet, Take 1 tablet (50 mg total) by mouth daily as needed for erectile dysfunction., Disp: 10 tablet, Rfl: 2   traZODone (DESYREL) 50 MG tablet, Take 1 tablet (50 mg total) by mouth at bedtime., Disp: 90 tablet, Rfl: 1   Review of Systems Review of Systems  Constitutional:  Negative for chills and fever.  Gastrointestinal:  Negative for abdominal pain, diarrhea, nausea and vomiting.  Genitourinary:  Negative for dysuria, flank pain, frequency, hematuria and urgency.  Neurological:  Negative for weakness.       Objective:    Vitals BP 118/80   Pulse 64   Temp 98.3 F (36.8 C)   Resp 16   Ht 6\' 1"  (1.854 m)   Wt 212 lb 12.8 oz (96.5 kg)   SpO2 97%   BMI 28.08 kg/m    Physical Examination Physical Exam Constitutional:      Appearance: Normal appearance. He is not ill-appearing.  Cardiovascular:     Rate and Rhythm: Normal rate and regular rhythm.     Pulses: Normal pulses.     Heart sounds: No murmur heard.    No friction rub. No gallop.  Pulmonary:     Effort: Pulmonary effort is normal. No respiratory distress.     Breath sounds: No wheezing, rhonchi or rales.  Abdominal:     General: Abdomen is flat. Bowel sounds are normal. There is no distension.     Palpations: Abdomen is soft.     Tenderness: There is no abdominal tenderness.  Musculoskeletal:     Right lower leg: No edema.     Left lower leg: No edema.  Skin:    General: Skin is warm and dry.     Findings: No rash.  Neurological:     Mental Status: He is alert.        Assessment & Plan:   Urinary tract infection without hematuria He has a UTI by his UA today. I am going to  send for urine culture.  We will start empiric antibiotics with ceftin.  I had discussion about hydration.    No follow-ups on file.   [name], MD

## 2022-09-07 NOTE — Assessment & Plan Note (Signed)
He has a UTI by his UA today. I am going to send for urine culture.  We will start empiric antibiotics with ceftin.  I had discussion about hydration.

## 2022-09-08 ENCOUNTER — Ambulatory Visit: Payer: Medicaid Other

## 2022-09-08 ENCOUNTER — Ambulatory Visit: Payer: Medicaid Other | Admitting: Physical Therapy

## 2022-09-08 DIAGNOSIS — M6281 Muscle weakness (generalized): Secondary | ICD-10-CM

## 2022-09-08 DIAGNOSIS — R278 Other lack of coordination: Secondary | ICD-10-CM

## 2022-09-08 DIAGNOSIS — R269 Unspecified abnormalities of gait and mobility: Secondary | ICD-10-CM

## 2022-09-08 DIAGNOSIS — R262 Difficulty in walking, not elsewhere classified: Secondary | ICD-10-CM

## 2022-09-08 DIAGNOSIS — R2681 Unsteadiness on feet: Secondary | ICD-10-CM

## 2022-09-08 NOTE — Therapy (Unsigned)
OUTPATIENT OCCUPATIONAL THERAPY NEURO TREATMENT NOTE  Patient Name: Jacob [name] MRN: [id number] DOB:10/06/1975, 47 y.o., male Today's Date: 09/09/2022  PCP: Dr. [name] REFERRING PROVIDER: Dr. Michel Santee Eyk  END OF SESSION:  OT End of Session - 09/08/22 0928     Visit Number 11    Number of Visits 24    Date for OT Re-Evaluation 09/10/22    Progress Note Due on Visit 10    OT Start Time 0930    OT Stop Time 1015    OT Time Calculation (min) 45 min    Equipment Utilized During Treatment wc    Activity Tolerance Patient tolerated treatment well    Behavior During Therapy WFL for tasks assessed/performed             Past Medical History:  Diagnosis Date   Allergy    Diabetes mellitus without complication    GERD (gastroesophageal reflux disease)    Hypertension    Paralysis    Stroke    Past Surgical History:  Procedure Laterality Date   APPENDECTOMY     BUBBLE STUDY  10/02/2021   Procedure: BUBBLE STUDY;  Surgeon: Pricilla Riffle, MD;  Location: [facility] ENDOSCOPY;  Service: Cardiovascular;;   CRANIOPLASTY Right 02/09/2022   Procedure: Craniectomy with Replacement of Bone Flap From the Abdomen;  Surgeon: Tressie Stalker, MD;  Location: [facility] OR;  Service: Neurosurgery;  Laterality: Right;   CRANIOTOMY Right 09/15/2021   Procedure: RIGHT DECOMPRESSIVE CRANIOTOMY, PLACEMENT OF BONE FLAP IN ABDOMEN;  Surgeon: Tressie Stalker, MD;  Location: [facility] OR;  Service: Neurosurgery;  Laterality: Right;   IR CT HEAD LTD  09/12/2021   IR PERCUTANEOUS ART THROMBECTOMY/INFUSION INTRACRANIAL INC DIAG ANGIO  09/12/2021   IR US GUIDE VASC ACCESS RIGHT  09/12/2021   LAPAROSCOPIC APPENDECTOMY  10/24/2013   Procedure: APPENDECTOMY LAPAROSCOPIC;  Surgeon: Kandis Cocking, MD;  Location: [facility] ORS;  Service: General;;   RADIOLOGY WITH ANESTHESIA N/A 09/12/2021   Procedure: IR WITH ANESTHESIA;  Surgeon: Julieanne Cotton, MD;  Location: [facility] OR;  Service: Radiology;  Laterality: N/A;   TEE WITHOUT  CARDIOVERSION N/A 10/02/2021   Procedure: TRANSESOPHAGEAL ECHOCARDIOGRAM (TEE);  Surgeon: Pricilla Riffle, MD;  Location: [facility] ENDOSCOPY;  Service: Cardiovascular;  Laterality: N/A;   Patient Active Problem List   Diagnosis Date Noted   Urinary tract infection without hematuria 09/07/2022   Mild episode of recurrent major depressive disorder 06/15/2022   Type 2 diabetes mellitus with hyperglycemia, with long-term current use of insulin    Debility 02/14/2022   Status post craniectomy 02/09/2022   Memory deficit following cerebral infarction 02/07/2022   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 01/19/2022   Pneumonia of both lower lobes due to methicillin resistant Staphylococcus aureus (MRSA)    Cerebral embolism with cerebral infarction 09/20/2021   Hypernatremia 09/19/2021   Urinary retention 09/18/2021   Fever 09/18/2021   Acute respiratory failure with hypoxia    Cerebral edema 09/15/2021   Acute ischemic stroke 09/12/2021   Dysphagia following cerebral infarction 05/25/2021   Vitamin D deficiency 11/04/2020   Hypertensive urgency 10/31/2020   Hyperlipidemia 03/28/2018   Diabetes mellitus without complication 02/23/2018   Essential hypertension 02/23/2018   ONSET DATE: April 2023  REFERRING DIAG: I63.9 (ICD-10-CM) - Cerebral infarction, unspecified   THERAPY DIAG:   Muscle weakness (generalized)  Other lack of coordination  Rationale for Evaluation and Treatment: Rehabilitation  SUBJECTIVE: Pt reports he hasn't been wearing his resting hand splint, but did  bring it today for OT to assess hand positioning in splint.  Pt/spouse brought in pt's new arm tray as requested by OT last session.  SUBJECTIVE STATEMENT: Pt accompanied by: self and significant other  PERTINENT HISTORY: Per chart, Jacob [name] is a 47 y.o. male with a history of prior CVA with decompressive craniectomy in April 2023.  He has a history of right peroneal DVT on Eliquis.  He has left  hemiparesis from stroke.   Per spouse, pt spent some time in [facility], then 2 months at [facility] [employer] (SNF). He presented for elective cranioplasty in Sept 2023.  Pt was admitted to rehab 02/14/2022 for inpatient therapies following cranioplasty, and participated in [facility] therapies until recently.      PRECAUTIONS: Fall  WEIGHT BEARING RESTRICTIONS: No  PAIN: 09/08/22: Pt initially reported LUE pain at 9/10; OT reviewed pain scale as pt did not physically present with severe pain levels.  Pt again stated 9/10 pain, but then several min later reported it had come down to 3/10 pain after AAROM and PROM throughout the LUE.   Are you having pain? Yes: NPRS scale: 0 (rest), when stretching 2/10 Pain location: L shoulder sublux Pain description: achy Aggravating factors: movement of the L shoulder  Relieving factors: lying down and gentle stretching   FALLS: Has patient fallen in last 6 months? Yes. Number of falls 1  LIVING ENVIRONMENT: Lives with: lives with their spouse and 36 y/o son, 1 cousin, and 1 friend Lives in: 2 level with master bedroom on the main level, pt resides only on 1st level Stairs: 1 step no rail to enter, pt does have small ramp Has following equipment at home: Hemi walker, Wheelchair (manual), Shower bench, bed side commode, Grab bars, Ramped entry, and platform walker (grab bar by the toilet but not in the shower.  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  PATIENT GOALS: Pt wants to get back to walking and being as indep as possible.   OBJECTIVE:   HAND DOMINANCE: Right  ADLs: Overall ADLs: HHA Mon-Fri 5 hours/day; spouse is pt's primary caregiver Transfers/ambulation related to ADLs: min A sit to stand pivot from wc using gait belt  Eating: assist to cut food  Grooming: min A to manage facial hair UB Dressing: max A LB Dressing: max-dep A Toileting: dep, wears adult briefs.  Spouse  reports pt has just started transitioning to use of BSC over the toilet vs voiding in a brief Bathing: max A with sponge/bed bath (in the process of getting bathroom re-done to enable pt to take a shower) Tub Shower transfers: N/A at this time; pt states he doesn't trust himself with the glass doors. Equipment:  see above  IADLs: Shopping: dep Light housekeeping: dep Meal Prep: dep Community mobility: wc dep  Medication management: dep Financial management: dep Handwriting:  Pt is R hand dominant, NT  MOBILITY STATUS:  Pt reports he can amb with hemi-walker 10-20 ft with max A (per spouse) and someone following with a wc behind him  POSTURE COMMENTS:  rounded shoulders, forward head, and L sided hemiplegia (L shoulder subluxation ~1 thumb breadth) Sitting balance:  able to sit edge of mat and tolerate moderate perturbation from all directions.  Pt can reach forward to ankles and return to midline without LOB.  FUNCTIONAL OUTCOME MEASURES: FOTO: 20; predicted 29 09/01/22: 19  UPPER EXTREMITY ROM:    Passive ROM Right Eval (A/PROMWNL)  Left eval Left 09/01/22  Shoulder flexion  78 90  Shoulder abduction  60 68  Shoulder adduction     Shoulder extension     Shoulder internal rotation     Shoulder external rotation  -10 (elbow at side) -10  Elbow flexion     Elbow extension     Wrist flexion  70 80  Wrist extension  48 58  Wrist ulnar deviation     Wrist radial deviation     Wrist pronation  90 90  Wrist supination  60 82  (Blank rows = not tested)  09/01/22: L hand passively opens ~75% of full extension d/t PIP and MP tightness in all digits.  UPPER EXTREMITY MMT:     MMT Right eval Left eval Left  09/01/22  Shoulder flexion 5 0 1  Shoulder abduction 5 0 1  Shoulder adduction     Shoulder extension     Shoulder internal rotation     Shoulder external rotation     Middle trapezius     Lower trapezius     Elbow flexion 5 0 1  Elbow extension 5 0 1  Wrist flexion 5 0  1  Wrist extension 5 0 1  Wrist ulnar deviation     Wrist radial deviation     Wrist pronation  0 1  Wrist supination  0 1  (Blank rows = not tested)  HAND FUNCTION: Grip strength: Right: 95 lbs; Left: NT lbs, Lateral pinch: Right: 25 lbs, Left: NT lbs, and 3 point pinch: Right: 20 lbs, Left: NT lbs 09/01/22: L grip/pinch NT (unable)  COORDINATION: 9 Hole Peg test: Right: NT sec; Left: unable sec 09/01/22: L NT (unable)  SENSATION: LUE: Light touch: Impaired  Proprioception: Impaired   EDEMA: None  MUSCLE TONE: LUE: Severe and Hypertonic sublux 1 thumb breadth  COGNITION: Overall cognitive status:  Decreased awareness of deficits  VISION: Subjective report: Pt reports decreased peripheral vision on the L Baseline vision: No visual deficits Visual history: N/A  VISION ASSESSMENT: Tracking/Visual pursuits: Decreased smoothness of eye movement to Left superior field, Decreased smoothness of eye movement to Left inferior field, and Requires cues, head turns, or add eye shifts to track Saccades: additional eye shifts occurred during testing and additional head turns occurred during testing Visual Fields: Left homonymous hemianopsia  PERCEPTION: Not tested, will assess in functional contexts with additional visits  PRAXIS: Impaired: Motor planning  OBSERVATIONS: L hand rests in flexion.  Noted slight odor originating from palm/between fingers.  Skin on volar hand appears to have some yeast.  Encouraged pt/spouse bring hand splint to all sessions to work towards opening up hand for improving skin integrity.  Spouse will bring next week.  TODAY'S TREATMENT:            Therapeutic Exercise:  Pt with fair tolerance to PROM throughout the LUE, including all ranges for the L shoulder, elbow, forearm, wrist flex/ext, and digit flex/ext.  Performed passive scapular depression and retraction stretch with good tolerance.  Performed AAROM for L shoulder chest press, forward flex, abd, and  horiz abd/add.  AAROM also performed for L elbow flex/ext.  Practiced active assisted repositioning of L arm on/off arm rest of wc, on off lap.    Manual Therapy: STM performed to tight musculature at L pec, anterior and middle deltoid, and proximal end of the biceps, working to decrease pain and increase tissue extensibility for better range when repositioning LUE during ADLs.    Self Care:  Assessed wc positioning with LUE resting on new L lap trap.  When pushed all the way in, lap tray presses into L side of pt's abdomen, though pt can not feel this.  Encouraged pt/spouse to avoid pushing tray all the way in (1/2 way) to prevent tray touching abdomen as to prevent bruising/skin breakdown/discomfort.  Issued dycem square for lap tray to reduce slipping of L hand from tray.   Donned splint today and pt tolerated without issue for at least 30 min. Reviewed recommendation to work up to 2 hours on/2 hours off during the day with L hand splint.   Encouraged pt wear for another hour at home and remove at lunch time.  Try donning again in the afternoon for 1-2 hours.  Advised pt to be consistent with splinting schedule/daily use and bring splint to each OT session for OT to assess fit and progress digit extension as able.  Pt verbalized understanding.  PATIENT EDUCATION: Education details: LUE positioning Person educated: Patient and Spouse Education method: Explanation and Verbal cues, tactile cues, demo Education comprehension: verbalized understanding and needs further education  HOME EXERCISE PROGRAM: PROM throughout the LUE  GOALS: Goals reviewed with patient? Yes  SHORT TERM GOALS: Target date: 07/30/22 (6 weeks)  Pt/caregivers will demo indep with HEP for reducing pain and stiffness throughout the LUE. Baseline: Not yet initiated; 09/01/22: Pt performs self passive stretching daily throughout the LUE, but caregiver is needed to meet end ranges and min vc for optimal technique.  Spouse reports pt  has new caregivers and they are not yet performing ROM, but spouse plans to develop a schedule.  Goal status: ongoing  2.  Pt/caregivers will demo supportive positioning techniques with min vc to support L hemiparetic limb. Baseline: Educ not yet initiated; 09/01/22: Pt has just received his wc lap tray but he does not like it, per spouse.  Pt is inconsistent to use supportive positioning via other methods that have been recommended, including use of pillows to support L shoulder sublux.  Pt does tuck his L hand into gait belt while working with PT during mobility and spouse reports that pt positions L arm on arm rest of couch at home. Goal status: ongoing  3.  Pt/caregivers will follow L hand splinting schedule with at last 50% compliance in order to reduce contracture risk and to optimize skin integrity of L hand. Baseline: Not yet initiated; 09/01/22: <50% compliance; pt reports inconsistent wearing time and splint was not brought to therapy session today.  OT encouraged pt bring to each session for OT to assess need for splinting adjustments as needed.   Goal status: ongoing   LONG TERM GOALS: Target date: 09/10/22 (12 weeks)  Pt will increase FOTO score to 29 or more to indicate increased indep with daily tasks.   Baseline: Eval: 20 (predicted 29); 09/01/22: 19 Goal status: ongoing  2.  Pt will transfer wc<>BSC with min guard Baseline: min-mod A; 09/01/22 min A consistently, wc to couch or wc to bed with supv Goal status: ongoing  3.  Pt will don/doff shirt with min A Baseline: Mod-max A; 09/01/22: supv to doff sweatshirt, min A to don Goal status: achieved  4.  Pt will manage clothing for toileting with min A Baseline: max A-dep; 08/31/12: mod A  Goal status: ongoing  5.  Pt will increase L shoulder strength by 2 MM grade to assist with repositioning L upper arm away from side for underarm care and donning shirt. Baseline: L shoulder  0/5; 09/01/22: L shoulder 1/5 (but must use R arm to  reposition the L) Goal status: ongoing, (revised from 1 MM grade to 2 MM grades)  6.  Through manual therapy, neuro re-ed, and therapeutic exercises, pt will report 1/10 pain or less throughout the LUE to increase tolerance for engaging the LUE into ADLs.   Baseline: LUE 5/10 pain, 09/01/22: Pt reports 2/10 pain throughout the LUE Goal status: ongoing (revised from 3/10 pain or less to 1/10 pain or less)  7.  Pt will perform supine<>sit bed transfer with supv and AE as needed into his queen size bed. Baseline: Mod A into hospital bed; modified indep with bed rail and trapeze into hospital bed, but pt wants to transition to his queen size bed and has not yet attempted this transfer.  Spouse reports that they may plan to order a bed rail.   Goal status: ongoing  ASSESSMENT:  CLINICAL IMPRESSION: Pt tired today following PT visit, requiring frequent cues for attention to task.  Pt reports that he always feels this way if he doesn't have OT first, and he and spouse plan to work on therapy schedule to coordinate OT first then PT when possible.  Pt tolerated resting hand splint well during session today without complaints of pain in L hand.  OT continues to reinforce importance of positioning to manage pain and reduce contracture risk and reviewed goal to be wearing splint 2 hours on and 2 hours off throughout the day.  Pt reports he hasn't worn splint in awhile, but verbalized understanding of goal to increase consistency.  No adjustment needed on splint today.  Assessed wc positioning with LUE resting on new L lap trap.  When pushed all the way in, lap tray presses into L side of pt's abdomen, though pt can not feel this.  Encouraged pt/spouse to avoid pushing tray all the way in (1/2 way) to prevent tray touching abdomen as to prevent bruising/skin breakdown/discomfort.  Issued dycem square for lap tray to reduce slipping of L hand from tray.   Pt tolerated PROM and AAROM fair today, with report of improved  pain following STM to L pec and upper arm.  Reviewed benefits of rolled towel at lumbar spine to promote upright posture and reduce protracted shoulders which contributes to tight pec.  Pt will continue to benefit from skilled OT to work towards optimal positioning of the LUE for contracture prevention, maximize ROM throughout the LUE for ADLs, decrease pain, maximize engagement of the LUE into daily tasks, as well work to maximize level of indep with daily tasks.   PERFORMANCE DEFICITS: in functional skills including ADLs, IADLs, coordination, dexterity, proprioception, sensation, tone, ROM, strength, pain, flexibility, Fine motor control, Gross motor control, mobility, balance, body mechanics, decreased knowledge of use of DME, skin integrity, vision, and UE functional use, cognitive skills including perception and safety awareness, and psychosocial skills including coping strategies and routines and behaviors.   IMPAIRMENTS: are limiting patient from ADLs, IADLs, leisure, and social participation.   CO-MORBIDITIES: may have co-morbidities  that affects occupational performance. Patient will benefit from skilled OT to address above impairments and improve overall function.  MODIFICATION OR ASSISTANCE TO COMPLETE EVALUATION: No modification of tasks or assist necessary to complete an evaluation.  OT OCCUPATIONAL PROFILE AND HISTORY: Problem focused assessment: Including review of records relating to presenting problem.  CLINICAL DECISION MAKING: Moderate - several treatment options, min-mod task modification necessary  REHAB POTENTIAL: Good  EVALUATION COMPLEXITY: High  PLAN:  OT FREQUENCY: 2x/week  OT DURATION: 12 weeks  PLANNED INTERVENTIONS: self care/ADL training, therapeutic exercise, therapeutic activity, neuromuscular re-education, manual therapy, passive range of motion, balance training, functional mobility training, splinting, electrical stimulation, moist heat, cryotherapy,  patient/family education, cognitive remediation/compensation, visual/perceptual remediation/compensation, psychosocial skills training, coping strategies training, and DME and/or AE instructions  RECOMMENDED OTHER SERVICES: N/A  CONSULTED AND AGREED WITH PLAN OF CARE: Patient and family member/caregiver  PLAN FOR NEXT SESSION: see plan  Danelle Earthly, MS, OTR/L  09/09/2022, 1:29 PM

## 2022-09-08 NOTE — Therapy (Signed)
OUTPATIENT PHYSICAL THERAPY NEURO TREATMENT   Patient Name: Jacob [name] MRN: [id number] DOB:1975/11/13, 47 y.o., male Today's Date: 09/08/2022   PCP: Dr. [name] REFERRING PROVIDER: Dr. Michel Santee Eyk  END OF SESSION:  PT End of Session - 09/08/22 0859     Visit Number 11    Number of Visits 24    Date for PT Re-Evaluation 09/15/22    Authorization Type [place] [employer]    Progress Note Due on Visit 20    PT Start Time 0846    PT Stop Time 0929    PT Time Calculation (min) 43 min    Equipment Utilized During Treatment Gait belt    Activity Tolerance Patient tolerated treatment well    Behavior During Therapy WFL for tasks assessed/performed                    Past Medical History:  Diagnosis Date   Allergy    Diabetes mellitus without complication    GERD (gastroesophageal reflux disease)    Hypertension    Paralysis    Stroke    Past Surgical History:  Procedure Laterality Date   APPENDECTOMY     BUBBLE STUDY  10/02/2021   Procedure: BUBBLE STUDY;  Surgeon: Pricilla Riffle, MD;  Location: [facility] ENDOSCOPY;  Service: Cardiovascular;;   CRANIOPLASTY Right 02/09/2022   Procedure: Craniectomy with Replacement of Bone Flap From the Abdomen;  Surgeon: Tressie Stalker, MD;  Location: [facility] OR;  Service: Neurosurgery;  Laterality: Right;   CRANIOTOMY Right 09/15/2021   Procedure: RIGHT DECOMPRESSIVE CRANIOTOMY, PLACEMENT OF BONE FLAP IN ABDOMEN;  Surgeon: Tressie Stalker, MD;  Location: [facility] OR;  Service: Neurosurgery;  Laterality: Right;   IR CT HEAD LTD  09/12/2021   IR PERCUTANEOUS ART THROMBECTOMY/INFUSION INTRACRANIAL INC DIAG ANGIO  09/12/2021   IR US GUIDE VASC ACCESS RIGHT  09/12/2021   LAPAROSCOPIC APPENDECTOMY  10/24/2013   Procedure: APPENDECTOMY LAPAROSCOPIC;  Surgeon: Kandis Cocking, MD;  Location: [facility] ORS;  Service: General;;   RADIOLOGY WITH ANESTHESIA N/A 09/12/2021   Procedure: IR WITH ANESTHESIA;  Surgeon: Julieanne Cotton, MD;  Location: [facility] OR;  Service:  Radiology;  Laterality: N/A;   TEE WITHOUT CARDIOVERSION N/A 10/02/2021   Procedure: TRANSESOPHAGEAL ECHOCARDIOGRAM (TEE);  Surgeon: Pricilla Riffle, MD;  Location: [facility] ENDOSCOPY;  Service: Cardiovascular;  Laterality: N/A;   Patient Active Problem List   Diagnosis Date Noted   Urinary tract infection without hematuria 09/07/2022   Mild episode of recurrent major depressive disorder 06/15/2022   Type 2 diabetes mellitus with hyperglycemia, with long-term current use of insulin    Debility 02/14/2022   Status post craniectomy 02/09/2022   Memory deficit following cerebral infarction 02/07/2022   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 01/19/2022   Pneumonia of both lower lobes due to methicillin resistant Staphylococcus aureus (MRSA)    Cerebral embolism with cerebral infarction 09/20/2021   Hypernatremia 09/19/2021   Urinary retention 09/18/2021   Fever 09/18/2021   Acute respiratory failure with hypoxia    Cerebral edema 09/15/2021   Acute ischemic stroke 09/12/2021   Dysphagia following cerebral infarction 05/25/2021   Vitamin D deficiency 11/04/2020   Hypertensive urgency 10/31/2020   Hyperlipidemia 03/28/2018   Diabetes mellitus without complication 02/23/2018   Essential hypertension 02/23/2018    ONSET DATE: 08/2021  REFERRING DIAG:  Diagnosis  I63.9 (ICD-10-CM) - Cerebral infarction, unspecified  R53.81 (ICD-10-CM) - Other malaise    THERAPY DIAG:  Muscle weakness (generalized)  Abnormality of gait and mobility  Difficulty in walking, not elsewhere classified  Unsteadiness on feet  Rationale for Evaluation and Treatment: Rehabilitation  SUBJECTIVE:                                                                                                                                                                                             SUBJECTIVE STATEMENT: Patient presents with his wife. Pt recovering well from UTI and feeling no symptoms at  this time.    Pt accompanied by: significant other- Wife  PERTINENT HISTORY: Patient is a 47 year old male with recent history of CVA  in April 2023 and then elective cranioplasty in September 2023 with subsequent inpatient rehab and then home health services. Patient has left hemiparesis.   PAIN:  Are you having pain? No  PRECAUTIONS: Fall  WEIGHT BEARING RESTRICTIONS: No  FALLS: Has patient fallen in last 6 months? Yes. 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   RADIATION DOSE REDUCTION: This exam was performed according to the departmental dose-optimization program which includes automated exposure control, adjustment of the mA and/or kV according to patient size and/or use of iterative reconstruction technique.   COMPARISON:  09/19/2021   FINDINGS: Brain: Diffuse cytotoxic edema is noted throughout the distribution of the right MCA and PCA watershed region similar to that seen on the prior exam. No parenchymal hemorrhage is seen. No new focal infarct is seen.   Vascular: No hyperdense vessel or unexpected calcification.   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   Sinuses/Orbits: Paranasal sinuses are within normal limits.   Other: Gastric catheter and feeding catheter are noted extending through the nose.   IMPRESSION: Right MCA/PCA infarct stable in appearance from the previous exam. No acute hemorrhage  is noted.   Right-sided hemi craniectomy stable from the prior exam. No new focal abnormality is noted.     Electronically Signed   By: Alcide Clever M.D.   On: 09/24/2021 02:47  COGNITION: Overall cognitive status: Impaired-decreased awareness of functional limitations   SENSATION: Light touch: Impaired - decreased  COORDINATION:  Left side hemiparesis  EDEMA:  None observed  MUSCLE TONE: LLE: Hypotonic   POSTURE: rounded shoulders and forward head  LOWER EXTREMITY ROM:     Active  Right Eval Left Eval  Hip flexion    Hip extension    Hip abduction    Hip adduction    Hip internal rotation    Hip external rotation    Knee flexion    Knee extension    Ankle dorsiflexion    Ankle plantarflexion    Ankle inversion    Ankle eversion     (Blank rows = not tested)  LOWER EXTREMITY MMT:    MMT Right Eval Left Eval  Hip flexion 5 2-  Hip extension 5 2-  Hip abduction 5 2-  Hip adduction 5 2-  Hip internal rotation 5 2-  Hip external rotation 5 2-  Knee flexion 5 2-  Knee extension 5 2-  Ankle dorsiflexion 5  0   Ankle plantarflexion    Ankle inversion    Ankle eversion    (Blank rows = not tested)  BED MOBILITY:  Not tested  TRANSFERS: Assistive device utilized: Hemi walker  Sit to stand: Mod A Stand to sit: Mod A Chair to chair: Mod A Floor:  Not tested   STAIRS: *Patient adamantly wanted to attempt today Level of Assistance: Max A Stair Negotiation Technique: Step to Pattern with Bilateral Rails Number of Stairs: 1  Height of Stairs: 6"  Comments: Patient wanted to attempt steps continuously requesting to try- He was able to  stand from w/c with mod assist using right arm for support on armrest of chair with gait belt. Once in standing he was able to step up with right LE with physical assist to block his left LE yet able to flex left hip/knee enough to clear 1st step- positioned back in chair.   GAIT: Gait pattern: step to pattern Distance walked: 8 feet Assistive device utilized: Hemi walker Level of assistance: Mod A Comments: Patient required VC for seqencing and use of left AFO today. - fatigues very quickly and left knee buckling with weight bearing.   FUNCTIONAL TESTS:  5 times sit to stand: Patient required mod Assist to stand and unable to complete on his own.  PATIENT SURVEYS:  FOTO 36; predicted goal= 47  TODAY'S TREATMENT:                                                                                                                              DATE: 09/08/22      Therex:   patient peformed the following with CGA, Gait belt  unless in seated position. CGA for transfers as well.   Nustep level 1 x 6 min with R UE assist for 5 min and PT A for last minute on LLE pushing portion  -Left lower extremity hip abduction/adduction blocked with NuStep attachment  Seated hamstring curls and knee extension x 10 with active assisted range of motion assisted by therapist.  Therapist providing moderate amount of assistance but patient does have some muscular activation at various points of range of motion  TA  Ambulation with contact-guard assist, hemiwalker from NuStep to balance station by steps in clinic.  Approximately 25 feet induration also while having to navigate various objects.  Patient says difficulty with weight shift to left side during stance phase of gait on the left.  Patient did not transitions into seated position with increased time assess difficulty with retrostepping with hemiwalker to approach seat safely.  Stair training right lower extremity on 6 inch step with left lower extremity  starting on floor and left lower extremity transitions to step followed by lowering back to floor.  Patient is difficulty with foot clearance on the left requiring cues for proper muscle activation and performance.    Ambulation with contact-guard assist from physical therapy treatment area to Occupational Therapy area approximately 30 feet.  Patient does have significant fatigue unable to make it to target wheelchair area and requires wheelchair to be placed behind him for seated rest at this time.    PATIENT EDUCATION: Education details: PT plan of care and HEP Person educated: Patient and Spouse Education method: Explanation, Demonstration, Tactile cues, Verbal cues, and Handouts Education comprehension: verbalized understanding, returned demonstration, verbal cues required, tactile cues required, and needs further education  HOME EXERCISE PROGRAM: Access Code: [email or web address] URL: [email or web address] Date: 06/23/2022 Prepared by: Maureen Ralphs  Exercises - Supine Quad Set  - 1 x daily - 7 x weekly - 3 sets - 10 reps - Seated Long Arc Quad  - 1 x daily - 7 x weekly - 3 sets - 10 reps - Seated March  - 1 x daily - 7 x weekly - 3 sets - 10 reps - Supine Bridge  - 1 x daily - 7 x weekly - 3 sets - 10 reps - Sit to Stand with Counter Support  - 1 x daily - 7 x weekly - 3 sets - 10 reps  GOALS: Goals reviewed with patient? Yes  SHORT TERM GOALS: Target date: 08/04/2022  Pt will be independent with HEP in order to improve strength and balance in order to decrease fall risk and improve function at home and work.  Baseline: EVAL-Patient with no formal HEP in place Goal status: INITIAL  2.  Patient will perform all stand pivot transfers with min Assist using hemi-walker for improved functional independence with mobility in home.  Baseline: EVAL= Mod A with transfers Goal status: INITIAL    LONG TERM GOALS: Target date: 09/15/2022  Pt will improve FOTO to target score  of 47  to display perceived improvements in ability to complete ADL's.  Baseline: EVAL= 36 Goal status: INITIAL  2.  Pt will achieve 5x STS in less than 60 seconds in order to demonstrate clinically significant improvement in LE strength. Baseline: EVAL- Mod A with sit to stand transfer Goal status: INITIAL INITIAL 3.  Patient will ambulate > 50 feet with hemiwalker using step to gait and CGA for improved household mobility.  Baseline: EVAL- 8 feet with hemiwalker and mod A Goal status: INITIAL  4.  Patient will perform all transfers with CGA  including w/c to various surfaces- toilet, bed, chairs using hemiwalker for improved functional independence with mobility.  Baseline:  EVAL- Mod asst with all transfers Goal status: INITIAL   ASSESSMENT:  CLINICAL IMPRESSION:  Patient presents with good motivation for completion of physical therapy activity.  Patient continues to progress with functional ambulation and stair training activity.  Patient transitions to using NuStep for therapeutic exercise to improve his lower extremity endurance, muscle activation, and strength.  Patient continuing to show progress continues to show good motivation for improvement.Pt will continue to benefit from skilled physical therapy intervention to address impairments, improve QOL, and attain therapy goals.       OBJECTIVE IMPAIRMENTS: Abnormal gait, cardiopulmonary status limiting activity, decreased activity tolerance, decreased balance, decreased cognition, decreased coordination, decreased endurance, decreased mobility, difficulty walking, decreased strength, decreased safety awareness, impaired perceived functional ability, impaired sensation, and impaired UE functional use.   ACTIVITY LIMITATIONS: carrying, lifting, bending, standing, squatting, stairs, transfers, and bed mobility  PARTICIPATION LIMITATIONS: meal prep, cleaning, laundry, driving, shopping, community activity, occupation, and yard  work  PERSONAL FACTORS: Time since onset of injury/illness/exacerbation and 1-2 comorbidities: HTN, DM  are also affecting patient's functional outcome.   REHAB POTENTIAL: Good  CLINICAL DECISION MAKING: Evolving/moderate complexity  EVALUATION COMPLEXITY: Moderate  PLAN:  PT FREQUENCY: 1-2x/week  PT DURATION: 12 weeks  PLANNED INTERVENTIONS: Therapeutic exercises, Therapeutic activity, Neuromuscular re-education, Balance training, Gait training, Patient/Family education, Self Care, Joint mobilization, Stair training, Orthotic/Fit training, DME instructions, Dry Needling, Electrical stimulation, Wheelchair mobility training, Spinal mobilization, Cryotherapy, Moist heat, Splintting, Taping, and Manual therapy  PLAN FOR NEXT SESSION: Review and progress LE strengthening. Transfer training for safe mobility, Gait training in // bars or with hemiwalker and close follow with w/c. Add to HEP as appropriate.    Norman Herrlich, PT 09/08/2022, 11:06 AM

## 2022-09-09 LAB — URINE CULTURE

## 2022-09-10 ENCOUNTER — Ambulatory Visit: Payer: Medicaid Other | Admitting: Physical Therapy

## 2022-09-15 ENCOUNTER — Ambulatory Visit: Payer: Medicaid Other | Admitting: Occupational Therapy

## 2022-09-15 ENCOUNTER — Encounter: Payer: Self-pay | Admitting: Occupational Therapy

## 2022-09-15 ENCOUNTER — Ambulatory Visit: Payer: Medicaid Other | Admitting: Physical Therapy

## 2022-09-15 DIAGNOSIS — R278 Other lack of coordination: Secondary | ICD-10-CM

## 2022-09-15 DIAGNOSIS — R269 Unspecified abnormalities of gait and mobility: Secondary | ICD-10-CM

## 2022-09-15 DIAGNOSIS — M6281 Muscle weakness (generalized): Secondary | ICD-10-CM

## 2022-09-15 DIAGNOSIS — R262 Difficulty in walking, not elsewhere classified: Secondary | ICD-10-CM

## 2022-09-15 DIAGNOSIS — R2681 Unsteadiness on feet: Secondary | ICD-10-CM

## 2022-09-15 NOTE — Therapy (Signed)
OUTPATIENT PHYSICAL THERAPY NEURO TREATMENT   Patient Name: Jacob [name] MRN: [id number] DOB:May 06, 1976, 47 y.o., male Today's Date: 09/15/2022   PCP: Dr. [name] REFERRING PROVIDER: Dr. Michel Santee Eyk  END OF SESSION:  PT End of Session - 09/15/22 1000     Visit Number 12    Number of Visits 24    Date for PT Re-Evaluation 09/15/22    Authorization Type [place] [employer]    Progress Note Due on Visit 20    PT Start Time 0932    PT Stop Time 1015    PT Time Calculation (min) 43 min    Equipment Utilized During Treatment Gait belt    Activity Tolerance Patient tolerated treatment well    Behavior During Therapy WFL for tasks assessed/performed                    Past Medical History:  Diagnosis Date   Allergy    Diabetes mellitus without complication    GERD (gastroesophageal reflux disease)    Hypertension    Paralysis    Stroke    Past Surgical History:  Procedure Laterality Date   APPENDECTOMY     BUBBLE STUDY  10/02/2021   Procedure: BUBBLE STUDY;  Surgeon: Pricilla Riffle, MD;  Location: [facility] ENDOSCOPY;  Service: Cardiovascular;;   CRANIOPLASTY Right 02/09/2022   Procedure: Craniectomy with Replacement of Bone Flap From the Abdomen;  Surgeon: Tressie Stalker, MD;  Location: [facility] OR;  Service: Neurosurgery;  Laterality: Right;   CRANIOTOMY Right 09/15/2021   Procedure: RIGHT DECOMPRESSIVE CRANIOTOMY, PLACEMENT OF BONE FLAP IN ABDOMEN;  Surgeon: Tressie Stalker, MD;  Location: [facility] OR;  Service: Neurosurgery;  Laterality: Right;   IR CT HEAD LTD  09/12/2021   IR PERCUTANEOUS ART THROMBECTOMY/INFUSION INTRACRANIAL INC DIAG ANGIO  09/12/2021   IR US GUIDE VASC ACCESS RIGHT  09/12/2021   LAPAROSCOPIC APPENDECTOMY  10/24/2013   Procedure: APPENDECTOMY LAPAROSCOPIC;  Surgeon: Kandis Cocking, MD;  Location: [facility] ORS;  Service: General;;   RADIOLOGY WITH ANESTHESIA N/A 09/12/2021   Procedure: IR WITH ANESTHESIA;  Surgeon: Julieanne Cotton, MD;  Location: [facility] OR;  Service:  Radiology;  Laterality: N/A;   TEE WITHOUT CARDIOVERSION N/A 10/02/2021   Procedure: TRANSESOPHAGEAL ECHOCARDIOGRAM (TEE);  Surgeon: Pricilla Riffle, MD;  Location: [facility] ENDOSCOPY;  Service: Cardiovascular;  Laterality: N/A;   Patient Active Problem List   Diagnosis Date Noted   Urinary tract infection without hematuria 09/07/2022   Mild episode of recurrent major depressive disorder 06/15/2022   Type 2 diabetes mellitus with hyperglycemia, with long-term current use of insulin    Debility 02/14/2022   Status post craniectomy 02/09/2022   Memory deficit following cerebral infarction 02/07/2022   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 01/19/2022   Pneumonia of both lower lobes due to methicillin resistant Staphylococcus aureus (MRSA)    Cerebral embolism with cerebral infarction 09/20/2021   Hypernatremia 09/19/2021   Urinary retention 09/18/2021   Fever 09/18/2021   Acute respiratory failure with hypoxia    Cerebral edema 09/15/2021   Acute ischemic stroke 09/12/2021   Dysphagia following cerebral infarction 05/25/2021   Vitamin D deficiency 11/04/2020   Hypertensive urgency 10/31/2020   Hyperlipidemia 03/28/2018   Diabetes mellitus without complication 02/23/2018   Essential hypertension 02/23/2018    ONSET DATE: 08/2021  REFERRING DIAG:  Diagnosis  I63.9 (ICD-10-CM) - Cerebral infarction, unspecified  R53.81 (ICD-10-CM) - Other malaise    THERAPY DIAG:  Muscle weakness (generalized)  Other lack of coordination  Abnormality of gait and mobility  Difficulty in walking, not elsewhere classified  Unsteadiness on feet  Rationale for Evaluation and Treatment: Rehabilitation  SUBJECTIVE:                                                                                                                                                                                             SUBJECTIVE STATEMENT: Patient reports that he is doing well. Excited about son's  birthday party over the weekend.    Pt accompanied by: significant other- Wife  PERTINENT HISTORY: Patient is a 47 year old male with recent history of CVA  in April 2023 and then elective cranioplasty in September 2023 with subsequent inpatient rehab and then home health services. Patient has left hemiparesis.   PAIN:  Are you having pain? No  PRECAUTIONS: Fall  WEIGHT BEARING RESTRICTIONS: No  FALLS: Has patient fallen in last 6 months? Yes. 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   RADIATION DOSE REDUCTION: This exam was performed according to the departmental dose-optimization program which includes automated exposure control, adjustment of the mA and/or kV according to patient size and/or use of iterative reconstruction technique.   COMPARISON:  09/19/2021   FINDINGS: Brain: Diffuse cytotoxic edema is noted throughout the distribution of the right MCA and PCA watershed region similar to that seen on the prior exam. No parenchymal hemorrhage is seen. No new focal infarct is seen.   Vascular: No hyperdense vessel or unexpected calcification.    Skull: Large calvarial defect there is noted on the right stable from the prior study.   Sinuses/Orbits: Paranasal sinuses are within normal limits.   Other: Gastric catheter and feeding catheter are noted extending through the nose.   IMPRESSION: Right MCA/PCA infarct stable in appearance from the previous exam.  No acute hemorrhage is noted.   Right-sided hemi craniectomy stable from the prior exam. No new focal abnormality is noted.     Electronically Signed   By: Alcide Clever M.D.   On: 09/24/2021 02:47  COGNITION: Overall cognitive status: Impaired-decreased awareness of functional limitations   SENSATION: Light touch: Impaired - decreased  COORDINATION:  Left side hemiparesis  EDEMA:  None observed  MUSCLE TONE: LLE: Hypotonic   POSTURE: rounded shoulders and forward head  LOWER EXTREMITY ROM:     Active  Right Eval Left Eval  Hip flexion    Hip extension    Hip abduction    Hip adduction    Hip internal rotation    Hip external rotation    Knee flexion    Knee extension    Ankle dorsiflexion    Ankle plantarflexion    Ankle inversion    Ankle eversion     (Blank rows = not tested)  LOWER EXTREMITY MMT:    MMT Right Eval Left Eval  Hip flexion 5 2-  Hip extension 5 2-  Hip abduction 5 2-  Hip adduction 5 2-  Hip internal rotation 5 2-  Hip external rotation 5 2-  Knee flexion 5 2-  Knee extension 5 2-  Ankle dorsiflexion 5  0   Ankle plantarflexion    Ankle inversion    Ankle eversion    (Blank rows = not tested)  BED MOBILITY:  Not tested  TRANSFERS: Assistive device utilized: Hemi walker  Sit to stand: Mod A Stand to sit: Mod A Chair to chair: Mod A Floor:  Not tested   STAIRS: *Patient adamantly wanted to attempt today Level of Assistance: Max A Stair Negotiation Technique: Step to Pattern with Bilateral Rails Number of Stairs: 1  Height of Stairs: 6"  Comments: Patient wanted to attempt steps continuously  requesting to try- He was able to stand from w/c with mod assist using right arm for support on armrest of chair with gait belt. Once in standing he was able to step up with right LE with physical assist to block his left LE yet able to flex left hip/knee enough to clear 1st step- positioned back in chair.   GAIT: Gait pattern: step to pattern Distance walked: 8 feet Assistive device utilized: Hemi walker Level of assistance: Mod A Comments: Patient required VC for seqencing and use of left AFO today. - fatigues very quickly and left knee buckling with weight bearing.   FUNCTIONAL TESTS:  5 times sit to stand: Patient required mod Assist to stand and unable to complete on his own.  PATIENT SURVEYS:  FOTO 36; predicted goal= 47  TODAY'S TREATMENT:                                                                                                                              DATE: 09/15/22       TA patient peformed the following with CGA/minA,  Gait belt unless in seated position. CGA for transfers as well.   Nustep level 1 x 5 min with BLE and RUE x 4 min and RUE and LLE for last minute. Mod assist from PT for neutral hip position and tactile cues for activation on the LLE.    Variable gait training with HW 2 x 94ft with CGA-min assist from PT as listed with tactile cues for terminal knee extension on the LLE as well improved trunkal extension and weight shift to the R when fatigued to prevent L/anterior LOB.  Forward/reverse gait with HW 2 x 43ft with CGA-min assist and moderate verbal cues for sequencing of AD management and gait pattern. Assist also required to prevent excessive step length on the LLE and tactile cues for terminal knee extension to the buckling on the LLE in stance.  PATIENT EDUCATION: Education details: PT plan of care and HEP Person educated: Patient and Spouse Education method: Explanation, Demonstration, Tactile cues, Verbal cues, and Handouts Education  comprehension: verbalized understanding, returned demonstration, verbal cues required, tactile cues required, and needs further education  HOME EXERCISE PROGRAM: Access Code: [email or web address] URL: [email or web address] Date: 06/23/2022 Prepared by: Maureen Ralphs  Exercises - Supine Quad Set  - 1 x daily - 7 x weekly - 3 sets - 10 reps - Seated Long Arc Quad  - 1 x daily - 7 x weekly - 3 sets - 10 reps - Seated March  - 1 x daily - 7 x weekly - 3 sets - 10 reps - Supine Bridge  - 1 x daily - 7 x weekly - 3 sets - 10 reps - Sit to Stand with Counter Support  - 1 x daily - 7 x weekly - 3 sets - 10 reps  GOALS: Goals reviewed with patient? Yes  SHORT TERM GOALS: Target date: 08/04/2022  Pt will be independent with HEP in order to improve strength and balance in order to decrease fall risk and improve function at home and work.  Baseline: EVAL-Patient with no formal HEP in place Goal status: INITIAL  2.  Patient will perform all stand pivot transfers with min Assist using hemi-walker for improved functional independence with mobility in home.  Baseline: EVAL= Mod A with transfers Goal status: INITIAL    LONG TERM GOALS: Target date: 09/15/2022  Pt will improve FOTO to target score of 47  to display perceived improvements in ability to complete ADL's.  Baseline: EVAL= 36 Goal status: INITIAL  2.  Pt will achieve 5x STS in less than 60 seconds in order to demonstrate clinically significant improvement in LE strength. Baseline: EVAL- Mod A with sit to stand transfer Goal status: INITIAL INITIAL 3.  Patient will ambulate > 50 feet with hemiwalker using step to gait and CGA for improved household mobility.  Baseline: EVAL- 8 feet with hemiwalker and mod A Goal status: INITIAL  4.  Patient will perform all transfers with CGA  including w/c to various surfaces- toilet, bed, chairs using hemiwalker for improved functional independence with mobility.  Baseline:  EVAL- Mod asst  with all transfers Goal status: INITIAL   ASSESSMENT:  CLINICAL IMPRESSION:  Patient presents with good motivation for completion of physical therapy activity.  Patient continues to progress with functional ambulation and stair training activity. Continues to require assist to activate quads and glutes in terminal knee extension in stance, on the L, especially with fatigue. Noted to have improved sequencing of retroversion on second bout.   Pt will continue to benefit from  skilled physical therapy intervention to address impairments, improve QOL, and attain therapy goals.       OBJECTIVE IMPAIRMENTS: Abnormal gait, cardiopulmonary status limiting activity, decreased activity tolerance, decreased balance, decreased cognition, decreased coordination, decreased endurance, decreased mobility, difficulty walking, decreased strength, decreased safety awareness, impaired perceived functional ability, impaired sensation, and impaired UE functional use.   ACTIVITY LIMITATIONS: carrying, lifting, bending, standing, squatting, stairs, transfers, and bed mobility  PARTICIPATION LIMITATIONS: meal prep, cleaning, laundry, driving, shopping, community activity, occupation, and yard work  PERSONAL FACTORS: Time since onset of injury/illness/exacerbation and 1-2 comorbidities: HTN, DM  are also affecting patient's functional outcome.   REHAB POTENTIAL: Good  CLINICAL DECISION MAKING: Evolving/moderate complexity  EVALUATION COMPLEXITY: Moderate  PLAN:  PT FREQUENCY: 1-2x/week  PT DURATION: 12 weeks  PLANNED INTERVENTIONS: Therapeutic exercises, Therapeutic activity, Neuromuscular re-education, Balance training, Gait training, Patient/Family education, Self Care, Joint mobilization, Stair training, Orthotic/Fit training, DME instructions, Dry Needling, Electrical stimulation, Wheelchair mobility training, Spinal mobilization, Cryotherapy, Moist heat, Splintting, Taping, and Manual therapy  PLAN FOR  NEXT SESSION: Review and progress LE strengthening. Transfer training for safe mobility, Gait training in // bars or with hemiwalker and close follow with w/c. Add to HEP as appropriate.   Grier Rocher PT, DPT  Physical Therapist - [place]  [facility]  10:51 AM 09/15/22

## 2022-09-15 NOTE — Therapy (Signed)
OUTPATIENT OCCUPATIONAL THERAPY NEURO RE-CERTIFICATION NOTE  Patient Name: Jacob [name] MRN: [id number] DOB:09-09-1975, 47 y.o., male Today's Date: 09/15/2022  PCP: Dr. [name] REFERRING PROVIDER: Dr. Michel Santee Eyk  END OF SESSION:  OT End of Session - 09/15/22 0932     Visit Number 12    Number of Visits 24    Date for OT Re-Evaluation 12/08/22    Progress Note Due on Visit 10    OT Start Time 0848    OT Stop Time 0930    OT Time Calculation (min) 42 min    Equipment Utilized During Treatment wc    Activity Tolerance Patient tolerated treatment well    Behavior During Therapy WFL for tasks assessed/performed             Past Medical History:  Diagnosis Date   Allergy    Diabetes mellitus without complication    GERD (gastroesophageal reflux disease)    Hypertension    Paralysis    Stroke    Past Surgical History:  Procedure Laterality Date   APPENDECTOMY     BUBBLE STUDY  10/02/2021   Procedure: BUBBLE STUDY;  Surgeon: Pricilla Riffle, MD;  Location: [facility] ENDOSCOPY;  Service: Cardiovascular;;   CRANIOPLASTY Right 02/09/2022   Procedure: Craniectomy with Replacement of Bone Flap From the Abdomen;  Surgeon: Tressie Stalker, MD;  Location: [facility] OR;  Service: Neurosurgery;  Laterality: Right;   CRANIOTOMY Right 09/15/2021   Procedure: RIGHT DECOMPRESSIVE CRANIOTOMY, PLACEMENT OF BONE FLAP IN ABDOMEN;  Surgeon: Tressie Stalker, MD;  Location: [facility] OR;  Service: Neurosurgery;  Laterality: Right;   IR CT HEAD LTD  09/12/2021   IR PERCUTANEOUS ART THROMBECTOMY/INFUSION INTRACRANIAL INC DIAG ANGIO  09/12/2021   IR US GUIDE VASC ACCESS RIGHT  09/12/2021   LAPAROSCOPIC APPENDECTOMY  10/24/2013   Procedure: APPENDECTOMY LAPAROSCOPIC;  Surgeon: Kandis Cocking, MD;  Location: [facility] ORS;  Service: General;;   RADIOLOGY WITH ANESTHESIA N/A 09/12/2021   Procedure: IR WITH ANESTHESIA;  Surgeon: Julieanne Cotton, MD;  Location: [facility] OR;  Service: Radiology;  Laterality: N/A;   TEE WITHOUT  CARDIOVERSION N/A 10/02/2021   Procedure: TRANSESOPHAGEAL ECHOCARDIOGRAM (TEE);  Surgeon: Pricilla Riffle, MD;  Location: [facility] ENDOSCOPY;  Service: Cardiovascular;  Laterality: N/A;   Patient Active Problem List   Diagnosis Date Noted   Urinary tract infection without hematuria 09/07/2022   Mild episode of recurrent major depressive disorder 06/15/2022   Type 2 diabetes mellitus with hyperglycemia, with long-term current use of insulin    Debility 02/14/2022   Status post craniectomy 02/09/2022   Memory deficit following cerebral infarction 02/07/2022   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 01/19/2022   Pneumonia of both lower lobes due to methicillin resistant Staphylococcus aureus (MRSA)    Cerebral embolism with cerebral infarction 09/20/2021   Hypernatremia 09/19/2021   Urinary retention 09/18/2021   Fever 09/18/2021   Acute respiratory failure with hypoxia    Cerebral edema 09/15/2021   Acute ischemic stroke 09/12/2021   Dysphagia following cerebral infarction 05/25/2021   Vitamin D deficiency 11/04/2020   Hypertensive urgency 10/31/2020   Hyperlipidemia 03/28/2018   Diabetes mellitus without complication 02/23/2018   Essential hypertension 02/23/2018   ONSET DATE: April 2023  REFERRING DIAG: I63.9 (ICD-10-CM) - Cerebral infarction, unspecified   THERAPY DIAG:   Muscle weakness (generalized)  Other lack of coordination  Rationale for Evaluation and Treatment: Rehabilitation  SUBJECTIVE: Pt reports MOD I to enter/exit hospital bed, spouse reports has not  tried their 67" high queen sized bed. Pt present wihtout resting hand splint.  SUBJECTIVE STATEMENT: Pt accompanied by: self and significant other  PERTINENT HISTORY: Per chart, Jacob [name] is a 47 y.o. male with a history of prior CVA with decompressive craniectomy in April 2023.  He has a history of right peroneal DVT on Eliquis.  He has left hemiparesis from stroke.   Per spouse, pt spent some  time in [facility], then 2 months at [facility] (SNF). He presented for elective cranioplasty in Sept 2023.  Pt was admitted to rehab 02/14/2022 for inpatient therapies following cranioplasty, and participated in [facility] therapies until recently.      PRECAUTIONS: Fall  WEIGHT BEARING RESTRICTIONS: No  PAIN: 09/15/22: Pt reports 3/10 pain   Are you having pain? Yes: NPRS scale: 0 (rest), when stretching 2/10 Pain location: L shoulder sublux Pain description: achy Aggravating factors: movement of the L shoulder  Relieving factors: lying down and gentle stretching   FALLS: Has patient fallen in last 6 months? Yes. Number of falls 1  LIVING ENVIRONMENT: Lives with: lives with their spouse and 67 y/o son, 1 cousin, and 1 friend Lives in: 2 level with master bedroom on the main level, pt resides only on 1st level Stairs: 1 step no rail to enter, pt does have small ramp Has following equipment at home: Hemi walker, Wheelchair (manual), Shower bench, bed side commode, Grab bars, Ramped entry, and platform walker (grab bar by the toilet but not in the shower.  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  PATIENT GOALS: Pt wants to get back to walking and being as indep as possible.   OBJECTIVE:   HAND DOMINANCE: Right  ADLs: Overall ADLs: HHA Mon-Fri 5 hours/day; spouse is pt's primary caregiver Transfers/ambulation related to ADLs: min A sit to stand pivot from wc using gait belt  Eating: assist to cut food  Grooming: min A to manage facial hair UB Dressing: max A LB Dressing: max-dep A Toileting: dep, wears adult briefs.  Spouse reports pt has just started transitioning to use of BSC over the toilet vs voiding in a brief Bathing: max A with sponge/bed bath (in the process of getting bathroom re-done to enable pt to take a shower) Tub Shower transfers: N/A at this time; pt states he doesn't trust himself with the  glass doors. Equipment:  see above  IADLs: Shopping: dep Light housekeeping: dep Meal Prep: dep Community mobility: wc dep  Medication management: dep Financial management: dep Handwriting:  Pt is R hand dominant, NT  MOBILITY STATUS:  Pt reports he can amb with hemi-walker 10-20 ft with max A (per spouse) and someone following with a wc behind him  POSTURE COMMENTS:  rounded shoulders, forward head, and L sided hemiplegia (L shoulder subluxation ~1 thumb breadth) Sitting balance:  able to sit edge of mat and tolerate moderate perturbation from all directions.  Pt can reach forward to ankles and return to midline without LOB.  FUNCTIONAL OUTCOME MEASURES: FOTO: 20; predicted 29 09/01/22: 19  UPPER EXTREMITY ROM:    Passive ROM Right Eval (A/PROMWNL) Left eval Left 09/01/22  Shoulder flexion  78 90  Shoulder abduction  60 68  Shoulder adduction     Shoulder extension     Shoulder internal rotation     Shoulder external rotation  -10 (elbow at side) -10  Elbow flexion     Elbow extension  Wrist flexion  70 80  Wrist extension  48 58  Wrist ulnar deviation     Wrist radial deviation     Wrist pronation  90 90  Wrist supination  60 82  (Blank rows = not tested)  09/01/22: L hand passively opens ~75% of full extension d/t PIP and MP tightness in all digits.  UPPER EXTREMITY MMT:     MMT Right eval Left eval Left  09/01/22  Shoulder flexion 5 0 1  Shoulder abduction 5 0 1  Shoulder adduction     Shoulder extension     Shoulder internal rotation     Shoulder external rotation     Middle trapezius     Lower trapezius     Elbow flexion 5 0 1  Elbow extension 5 0 1  Wrist flexion 5 0 1  Wrist extension 5 0 1  Wrist ulnar deviation     Wrist radial deviation     Wrist pronation  0 1  Wrist supination  0 1  (Blank rows = not tested)  HAND FUNCTION: Grip strength: Right: 95 lbs; Left: NT lbs, Lateral pinch: Right: 25 lbs, Left: NT lbs, and 3 point pinch: Right:  20 lbs, Left: NT lbs 09/01/22: L grip/pinch NT (unable)  COORDINATION: 9 Hole Peg test: Right: NT sec; Left: unable sec 09/01/22: L NT (unable)  SENSATION: LUE: Light touch: Impaired  Proprioception: Impaired   EDEMA: None  MUSCLE TONE: LUE: Severe and Hypertonic sublux 1 thumb breadth  COGNITION: Overall cognitive status:  Decreased awareness of deficits  VISION: Subjective report: Pt reports decreased peripheral vision on the L Baseline vision: No visual deficits Visual history: N/A  VISION ASSESSMENT: Tracking/Visual pursuits: Decreased smoothness of eye movement to Left superior field, Decreased smoothness of eye movement to Left inferior field, and Requires cues, head turns, or add eye shifts to track Saccades: additional eye shifts occurred during testing and additional head turns occurred during testing Visual Fields: Left homonymous hemianopsia  PERCEPTION: Not tested, will assess in functional contexts with additional visits  PRAXIS: Impaired: Motor planning  OBSERVATIONS: L hand rests in flexion on lap tray, improved hand hygiene this date  TODAY'S TREATMENT:            Therapeutic Exercise:  Pt with fair tolerance to PROM throughout the LUE in supine and sitting, including all ranges for the L shoulder, elbow, forearm, wrist flex/ext, and digit flex/ext. Reports increased pain with L digits 3-5 extension.  Performed passive scapular depression and retraction stretch with good tolerance seated in wheelchair.  Performed AAROM for L shoulder chest press, forward flex, abd, and horiz abd/add, in supine and sitting. Seated AAROM elbow flexion/extension using lap table for gravity eliminated plane, requires min assist to achieve ~1/2 of full ROM.    Self Care: Simulated bed t/f onto elevated mat table at 27" (home bed is 29" however table maxed out at 27"). MIN A w/c<>bed t/f. CGA sit<>sup for 2 trials rolling towards L vs R side, both directions with good awareness of  supporting L side. Pt utilizes R hand grasp onto edge of mat table - reports plan to install bed rail when transitioning to home bed.   PATIENT EDUCATION: Education details: LUE positioning Person educated: Patient and Spouse Education method: Explanation and Verbal cues, tactile cues, demo Education comprehension: verbalized understanding and needs further education  HOME EXERCISE PROGRAM: PROM throughout the LUE  GOALS: Goals reviewed with patient? Yes  SHORT TERM GOALS: Target date: 10/27/22 (  6 weeks)  Pt/caregivers will demo indep with HEP for reducing pain and stiffness throughout the LUE. Baseline: Not yet initiated; 09/01/22: Pt performs self passive stretching daily throughout the LUE, but caregiver is needed to meet end ranges and min vc for optimal technique.  Spouse reports pt has new caregivers and they are not yet performing ROM, but spouse plans to develop a schedule.  Goal status: ongoing  2.  Pt/caregivers will demo supportive positioning techniques with min vc to support L hemiparetic limb. Baseline: Educ not yet initiated; 09/01/22: Pt has just received his wc lap tray but he does not like it, per spouse.  Pt is inconsistent to use supportive positioning via other methods that have been recommended, including use of pillows to support L shoulder sublux.  Pt does tuck his L hand into gait belt while working with PT during mobility and spouse reports that pt positions L arm on arm rest of couch at home. Goal status: ongoing  3.  Pt/caregivers will follow L hand splinting schedule with at last 50% compliance in order to reduce contracture risk and to optimize skin integrity of L hand. Baseline: Not yet initiated; 09/01/22: <50% compliance; pt reports inconsistent wearing time and splint was not brought to therapy session today.  OT encouraged pt bring to each session for OT to assess need for splinting adjustments as needed.   Goal status: ongoing   LONG TERM GOALS: Target date:  12/08/22 (12 weeks)  Pt will increase FOTO score to 29 or more to indicate increased indep with daily tasks.   Baseline: Eval: 20 (predicted 29); 09/01/22: 19 Goal status: ongoing  2.  Pt will transfer wc<>BSC with min guard Baseline: min-mod A; 09/01/22 min A consistently, wc to couch or wc to bed with supv Goal status: ongoing  3.  Pt will don/doff shirt with min A Baseline: Mod-max A; 09/01/22: supv to doff sweatshirt, min A to don Goal status: achieved  4.  Pt will manage clothing for toileting with min A Baseline: max A-dep; 08/31/12: mod A  Goal status: ongoing  5.  Pt will increase L shoulder strength by 2 MM grade to assist with repositioning L upper arm away from side for underarm care and donning shirt. Baseline: L shoulder 0/5; 09/01/22: L shoulder 1/5 (but must use R arm to reposition the L) Goal status: ongoing, (revised from 1 MM grade to 2 MM grades)  6.  Through manual therapy, neuro re-ed, and therapeutic exercises, pt will report 1/10 pain or less throughout the LUE to increase tolerance for engaging the LUE into ADLs.   Baseline: LUE 5/10 pain, 09/01/22: Pt reports 2/10 pain throughout the LUE Goal status: ongoing (revised from 3/10 pain or less to 1/10 pain or less)  7.  Pt will perform supine<>sit bed transfer with supv and AE as needed into his queen size bed. Baseline: Mod A into hospital bed; modified indep with bed rail and trapeze into hospital bed, but pt wants to transition to his queen size bed and has not yet attempted this transfer.  Spouse reports that they may plan to order a bed rail.   Goal status: ongoing  ASSESSMENT:  CLINICAL IMPRESSION: MIN A w/c<> 27" mat table t/f and CGA sup<>sit x2 trials (towards L and R side) using edge of mat for support. Plan to use bed rail when transitioning to 29" queen bed at home. Pt tolerated PROM and AAROM fair today, with increased pain with L digit extension.  Pt will  continue to benefit from skilled OT to work towards  optimal positioning of the LUE for contracture prevention, maximize ROM throughout the LUE for ADLs, decrease pain, maximize engagement of the LUE into daily tasks, as well work to maximize level of indep with daily tasks.   PERFORMANCE DEFICITS: in functional skills including ADLs, IADLs, coordination, dexterity, proprioception, sensation, tone, ROM, strength, pain, flexibility, Fine motor control, Gross motor control, mobility, balance, body mechanics, decreased knowledge of use of DME, skin integrity, vision, and UE functional use, cognitive skills including perception and safety awareness, and psychosocial skills including coping strategies and routines and behaviors.   IMPAIRMENTS: are limiting patient from ADLs, IADLs, leisure, and social participation.   CO-MORBIDITIES: may have co-morbidities  that affects occupational performance. Patient will benefit from skilled OT to address above impairments and improve overall function.  MODIFICATION OR ASSISTANCE TO COMPLETE EVALUATION: No modification of tasks or assist necessary to complete an evaluation.  OT OCCUPATIONAL PROFILE AND HISTORY: Problem focused assessment: Including review of records relating to presenting problem.  CLINICAL DECISION MAKING: Moderate - several treatment options, min-mod task modification necessary  REHAB POTENTIAL: Good  EVALUATION COMPLEXITY: High    PLAN:  OT FREQUENCY: 2x/week  OT DURATION: 12 weeks  PLANNED INTERVENTIONS: self care/ADL training, therapeutic exercise, therapeutic activity, neuromuscular re-education, manual therapy, passive range of motion, balance training, functional mobility training, splinting, electrical stimulation, moist heat, cryotherapy, patient/family education, cognitive remediation/compensation, visual/perceptual remediation/compensation, psychosocial skills training, coping strategies training, and DME and/or AE instructions  RECOMMENDED OTHER SERVICES: N/A  CONSULTED AND  AGREED WITH PLAN OF CARE: Patient and family member/caregiver  PLAN FOR NEXT SESSION: see plan  Kathie Dike, M.S. OTR/L  09/15/22, 1:24 PM  ascom [phone]   09/15/2022, 9:33 AM

## 2022-09-15 NOTE — Addendum Note (Signed)
Addended byOda Cogan on: 09/15/2022 11:39 AM   Modules accepted: Orders

## 2022-09-16 ENCOUNTER — Other Ambulatory Visit: Payer: Self-pay

## 2022-09-16 ENCOUNTER — Emergency Department (HOSPITAL_COMMUNITY): Payer: Medicaid Other

## 2022-09-16 ENCOUNTER — Emergency Department (HOSPITAL_COMMUNITY)
Admission: EM | Admit: 2022-09-16 | Discharge: 2022-09-16 | Disposition: A | Discharge status: Home / Self Care | Payer: Medicaid Other | Attending: Emergency Medicine | Admitting: Emergency Medicine

## 2022-09-16 ENCOUNTER — Encounter (HOSPITAL_COMMUNITY): Payer: Self-pay

## 2022-09-16 DIAGNOSIS — L905 Scar conditions and fibrosis of skin: Secondary | ICD-10-CM | POA: Insufficient documentation

## 2022-09-16 DIAGNOSIS — Y900 Blood alcohol level of less than 20 mg/100 ml: Secondary | ICD-10-CM | POA: Insufficient documentation

## 2022-09-16 DIAGNOSIS — Z7901 Long term (current) use of anticoagulants: Secondary | ICD-10-CM | POA: Diagnosis not present

## 2022-09-16 DIAGNOSIS — R404 Transient alteration of awareness: Secondary | ICD-10-CM

## 2022-09-16 DIAGNOSIS — R569 Unspecified convulsions: Secondary | ICD-10-CM | POA: Insufficient documentation

## 2022-09-16 LAB — COMPREHENSIVE METABOLIC PANEL
ALT: 17 U/L (ref 0–44)
AST: 24 U/L (ref 15–41)
Albumin: 3.5 g/dL (ref 3.5–5.0)
Alkaline Phosphatase: 122 U/L (ref 38–126)
Anion gap: 11 (ref 5–15)
BUN: 8 mg/dL (ref 6–20)
CO2: 28 mmol/L (ref 22–32)
Calcium: 9.5 mg/dL (ref 8.9–10.3)
Chloride: 100 mmol/L (ref 98–111)
Creatinine, Ser: 0.74 mg/dL (ref 0.61–1.24)
GFR, Estimated: >60 mL/min (ref >60)
Glucose, Bld: 261 mg/dL — ABNORMAL HIGH (ref 70–99)
Potassium: 3.3 mmol/L — ABNORMAL LOW (ref 3.5–5.1)
Sodium: 139 mmol/L (ref 135–145)
Total Bilirubin: 1.1 mg/dL (ref 0.3–1.2)
Total Protein: 6.9 g/dL (ref 6.5–8.1)

## 2022-09-16 LAB — DIFFERENTIAL
Abs Immature Granulocytes: 0.01 10*3/uL (ref 0.00–0.07)
Basophils Absolute: 0 10*3/uL (ref 0.0–0.1)
Basophils Relative: 0 %
Eosinophils Absolute: 0.1 10*3/uL (ref 0.0–0.5)
Eosinophils Relative: 2 %
Immature Granulocytes: 0 %
Lymphocytes Relative: 35 %
Lymphs Abs: 1.8 10*3/uL (ref 0.7–4.0)
Monocytes Absolute: 0.4 10*3/uL (ref 0.1–1.0)
Monocytes Relative: 7 %
Neutro Abs: 2.9 10*3/uL (ref 1.7–7.7)
Neutrophils Relative %: 56 %

## 2022-09-16 LAB — CBC
HCT: 40.4 % (ref 39.0–52.0)
Hemoglobin: 14 g/dL (ref 13.0–17.0)
MCH: 28.7 pg (ref 26.0–34.0)
MCHC: 34.7 g/dL (ref 30.0–36.0)
MCV: 82.8 fL (ref 80.0–100.0)
Platelets: 188 10*3/uL (ref 150–400)
RBC: 4.88 MIL/uL (ref 4.22–5.81)
RDW: 14.6 % (ref 11.5–15.5)
WBC: 5.3 10*3/uL (ref 4.0–10.5)
nRBC: 0 % (ref 0.0–0.2)

## 2022-09-16 LAB — PROTIME-INR
INR: 1.1 (ref 0.8–1.2)
Prothrombin Time: 14.5 seconds (ref 11.4–15.2)

## 2022-09-16 LAB — I-STAT CHEM 8, ED
BUN: 9 mg/dL (ref 6–20)
Calcium, Ion: 1.2 mmol/L (ref 1.15–1.40)
Chloride: 99 mmol/L (ref 98–111)
Creatinine, Ser: 0.6 mg/dL — ABNORMAL LOW (ref 0.61–1.24)
Glucose, Bld: 256 mg/dL — ABNORMAL HIGH (ref 70–99)
HCT: 43 % (ref 39.0–52.0)
Hemoglobin: 14.6 g/dL (ref 13.0–17.0)
Potassium: 3.5 mmol/L (ref 3.5–5.1)
Sodium: 141 mmol/L (ref 135–145)
TCO2: 29 mmol/L (ref 22–32)

## 2022-09-16 LAB — ETHANOL: Alcohol, Ethyl (B): <10 mg/dL (ref <10)

## 2022-09-16 LAB — APTT: aPTT: 32 seconds (ref 24–36)

## 2022-09-16 LAB — CBG MONITORING, ED: Glucose-Capillary: 265 mg/dL — ABNORMAL HIGH (ref 70–99)

## 2022-09-16 MED ORDER — LEVETIRACETAM 500 MG PO TABS
1500.0000 mg | ORAL_TABLET | Freq: Once | ORAL | Status: AC
  Administered 2022-09-16: 1500 mg via ORAL
  Filled 2022-09-16: qty 3

## 2022-09-16 MED ORDER — LEVETIRACETAM 500 MG PO TABS: 500.0000 mg | ORAL_TABLET | Freq: Two times a day (BID) | ORAL | 1 refills | Status: DC

## 2022-09-16 MED ORDER — SODIUM CHLORIDE 0.9% FLUSH: 3.0000 mL | Freq: Once | INTRAVENOUS | Status: DC

## 2022-09-16 NOTE — Discharge Instructions (Signed)
You have been seen and discharged from the emergency department.  Your workup here was normal for you.  Take Keppra as directed.  Follow-up with Dr. Pearlean Brownie for further evaluation and EEG study.  Follow-up with your primary provider for further evaluation and further care. Take home medications as prescribed. If you have any worsening symptoms or further concerns for your health please return to an emergency department for further evaluation.

## 2022-09-16 NOTE — ED Notes (Signed)
Patient transported to CT 

## 2022-09-16 NOTE — ED Triage Notes (Signed)
Coming from home while sitting on edge of bed wife reports he had a blank stare towards the wall and would not answer.  He is alert and oriented x 4 now and with ems.  BP 132/80  HR 70  [id number]  99%RA Patient has left side deficit from previous stroke from April 2023 Denies pain at this time.

## 2022-09-16 NOTE — ED Provider Triage Note (Signed)
Emergency Medicine Provider Triage Evaluation Note  Jacob [name] , a 47 y.o. male  was evaluated in triage.  Pt complains of staring episode that lasted approximately 20 seconds where he stared at a wall and his wife was concerned he was having another stroke CVA last year. Denies any symptoms at this time.  Review of Systems  Positive:  Negative: Numbness/weakness  Physical Exam  BP 137/82 (BP Location: Right Arm)   Pulse 72   Temp 98.1 F (36.7 C)   Resp 20   Ht  (1.854 m)   Wt 96.2 kg   SpO2 100%   BMI 27.97 kg/m  Gen:   Awake, no distress   Resp:  Normal effort  MSK:   Moves extremities without difficulty  Other:    Medical Decision Making  Medically screening exam initiated at 11:03 AM.  Appropriate orders placed.  Jacob [name] was informed that the remainder of the evaluation will be completed by another provider, this initial triage assessment does not replace that evaluation, and the importance of remaining in the ED until their evaluation is complete.     Glyn Ade, MD 09/16/22 1104

## 2022-09-16 NOTE — ED Provider Notes (Signed)
[place] EMERGENCY DEPARTMENT AT [facility] Provider Note   CSN: [id number] Arrival date & time: 09/16/22  1052     History  Chief Complaint  Patient presents with   Seizures    Jacob [name] is a 47 y.o. male.  HPI   46 year old male with past medical history of CVA in April 2023 with resulting cerebral edema requiring craniectomy by Dr. Lovell Sheehan with revision presents to the emergency department with a staring episode.  Patient woke up at his normal time around 9:30 AM this morning.  He was sitting on the side of the bed.  This is when he had approximately 20 seconds of staring off and not answering his wife.  He reportedly was slumping to the right side, eyes open and staring at the wall.  He reports being able to hear her talk but was unable to "figure out what was going on" or respond to her.  There was no shaking or incontinence, no tongue biting.  Patient never had an episode like this before.  Once it resolved patient was verbal and acting per baseline.  Ambulance was called.  No ongoing headache.  No recent fever or acute change in health.  Patient has noted baseline left-sided deficits.  Home Medications Prior to Admission medications   Medication Sig Start Date End Date Taking? Authorizing Provider  acetaminophen (TYLENOL) 325 MG tablet Take 650 mg by mouth every 12 (twelve) hours as needed for moderate pain.    [provider]  amLODipine (NORVASC) 10 MG tablet Take 1 tablet (10 mg total) by mouth daily. 05/29/22   [name], MD  atorvastatin (LIPITOR) 40 MG tablet Take 1 tablet (40 mg total) by mouth daily. 05/13/22   Eloisa Northern, MD  baclofen (LIORESAL) 10 MG tablet Take 1 tablet (10 mg total) by mouth 3 (three) times daily. 06/24/22   [name], MD  benzonatate (TESSALON) 100 MG capsule Take 1 capsule (100 mg total) by mouth every 8 (eight) hours. 07/15/22   Fayrene Helper, PA-C  blood glucose meter kit and supplies KIT Dispense based on patient and  insurance preference. Use up to four times daily as directed. 04/09/22   [name], MD  cefUROXime (CEFTIN) 250 MG tablet Take 1 tablet (250 mg total) by mouth 2 (two) times daily with a meal for 10 days. 09/07/22 09/17/22  [name], MD  cloNIDine (CATAPRES - DOSED IN MG/24 HR) 0.3 mg/24hr patch Place 1 patch (0.3 mg total) onto the skin once a week. 05/14/22   Eloisa Northern, MD  Continuous Blood Gluc Receiver (DEXCOM G7 RECEIVER) DEVI USE TO CHECK FSBS THREE TIMES DAILY 06/23/22   Leonia Reader, Barbara Cower, MD  Continuous Blood Gluc Sensor (DEXCOM G7 SENSOR) MISC Use to check FSBS three times daily 06/19/22   Leonia Reader, Barbara Cower, MD  ELIQUIS 5 MG TABS tablet TAKE 1 TABLET BY MOUTH TWICE A DAY 09/08/22   [name], MD  escitalopram (LEXAPRO) 20 MG tablet Take 1.5 tablets (30 mg total) by mouth daily. 05/13/22   Eloisa Northern, MD  feeding supplement (ENSURE ENLIVE / ENSURE PLUS) LIQD Take 237 mLs by mouth 2 (two) times daily between meals. 03/06/22   Setzer, Lynnell Jude, PA-C  glucose blood (RELION TRUE METRIX TEST STRIPS) test strip Use as instructed 06/11/22   Leonia Reader, Barbara Cower, MD  insulin glargine (LANTUS) 100 UNIT/ML injection Inject 0.1 mLs (10 Units total) into the skin daily. 04/13/22   [name],  MD  insulin lispro (HUMALOG) 100 UNIT/ML KwikPen Inject 0-20 Units into the skin 3 (three) times daily. Inject 0-20 Units into the skin 3 (three) times daily with meals. CBG < 70 call MD. CBG 70-120 give 0 units; 121-150 give 3 units; 151-200 give 4 units, 201-250 give 7 units; 251-300 give 11 units; 301-350 give 15 units; 351-400 give 20 units; greater than 400 call MD 04/09/22   [name], MD  Lancets Ultra Fine MISC Use as directed 04/14/22   [name], MD  lisinopril (ZESTRIL) 40 MG tablet Take 1 tablet (40 mg total) by mouth daily. 05/29/22   [name], MD  metFORMIN (GLUCOPHAGE) 500 MG tablet Take 1 tablet (500 mg total) by mouth 2 (two) times daily with a meal. 06/23/22   Leonia Reader, Barbara Cower, MD   metoprolol tartrate (LOPRESSOR) 25 MG tablet Take 1 tablet (25 mg total) by mouth 2 (two) times daily. 05/13/22   Eloisa Northern, MD  oseltamivir (TAMIFLU) 75 MG capsule Take 1 capsule (75 mg total) by mouth every 12 (twelve) hours. 07/15/22   Fayrene Helper, PA-C  potassium chloride SA (KLOR-CON M) 20 MEQ tablet Take 1 tablet (20 mEq total) by mouth 2 (two) times daily. 04/13/22   [name], MD  sildenafil (VIAGRA) 50 MG tablet Take 1 tablet (50 mg total) by mouth daily as needed for erectile dysfunction. 06/15/22   [name], MD  traZODone (DESYREL) 50 MG tablet Take 1 tablet (50 mg total) by mouth at bedtime. 05/13/22   Eloisa Northern, MD      Allergies    Hydrochlorothiazide    Review of Systems   Review of Systems  Constitutional:  Negative for fever.  Respiratory:  Negative for shortness of breath.   Cardiovascular:  Negative for chest pain.  Gastrointestinal:  Negative for abdominal pain, diarrhea and vomiting.  Skin:  Negative for rash.  Neurological:  Positive for speech difficulty. Negative for dizziness, tremors, syncope and headaches.    Physical Exam Updated Vital Signs BP 137/82 (BP Location: Right Arm)   Pulse 72   Temp 98.1 F (36.7 C)   Resp 20   Ht 6\' 1"  (1.854 m)   Wt 96.2 kg   SpO2 100%   BMI 27.97 kg/m  Physical Exam Vitals and nursing note reviewed.  Constitutional:      General: He is not in acute distress.    Appearance: Normal appearance.  HENT:     Head:     Comments: Large craniotomy scar    Mouth/Throat:     Mouth: Mucous membranes are moist.  Cardiovascular:     Rate and Rhythm: Normal rate.  Pulmonary:     Effort: Pulmonary effort is normal. No respiratory distress.  Abdominal:     Palpations: Abdomen is soft.     Tenderness: There is no abdominal tenderness.  Skin:    General: Skin is warm.  Neurological:     Mental Status: He is alert and oriented to person, place, and time. Mental status is at baseline.     Comments: Left-sided  baseline deficits, speech baseline per wife   Psychiatric:        Mood and Affect: Mood normal.     ED Results / Procedures / Treatments   Labs (all labs ordered are listed, but only abnormal results are displayed) Labs Reviewed  COMPREHENSIVE METABOLIC PANEL - Abnormal; Notable for the following components:      Result Value   Potassium 3.3 (*)  Glucose, Bld 261 (*)    All other components within normal limits  I-STAT CHEM 8, ED - Abnormal; Notable for the following components:   Creatinine, Ser 0.60 (*)    Glucose, Bld 256 (*)    All other components within normal limits  CBG MONITORING, ED - Abnormal; Notable for the following components:   Glucose-Capillary 265 (*)    All other components within normal limits  PROTIME-INR  APTT  CBC  DIFFERENTIAL  ETHANOL    EKG EKG Interpretation  Date/Time:  Wednesday September 16 2022 10:51:34 EDT Ventricular Rate:  75 PR Interval:  184 QRS Duration: 96 QT Interval:  384 QTC Calculation: 428 R Axis:   -18 Text Interpretation: Normal sinus rhythm Septal infarct , age undetermined Abnormal ECG When compared with ECG of 10-Feb-2022 15:38, PREVIOUS ECG IS PRESENT Confirmed by Glyn Ade [id number]) on 09/16/2022 11:09:33 AM  Radiology CT HEAD WO CONTRAST  Result Date: 09/16/2022 CLINICAL DATA:  Neuro deficit, acute, stroke suspected. EXAM: CT HEAD WITHOUT CONTRAST TECHNIQUE: Contiguous axial images were obtained from the base of the skull through the vertex without intravenous contrast. RADIATION DOSE REDUCTION: This exam was performed according to the departmental dose-optimization program which includes automated exposure control, adjustment of the mA and/or kV according to patient size and/or use of iterative reconstruction technique. COMPARISON:  09/24/2021 FINDINGS: Brain: No acute finding affects the brainstem or cerebellum. Wallerian degeneration of the midbrain and brainstem on the right related to the old right MCA territory  infarction. Left cerebral hemisphere is normal. Old infarction in the inferior division right MCA territory with atrophy and encephalomalacia. Ex vacuo enlargement of the right lateral ventricle. Previous craniectomy with replacement of the bone flap. Dural thickening along the bone flap. This does not represent acute hemorrhage. No evidence of obstructive hydrocephalus. No acute ischemic infarction visible by CT. Vascular: No abnormal vascular finding Skull: Replacement of craniectomy bone on the right without unexpected finding. Sinuses/Orbits: Clear/normal Other: None IMPRESSION: No acute CT finding. Old right MCA territory infarction with atrophy and encephalomalacia. Previous craniectomy with replacement of the bone flap. Dural thickening along the bone flap. These results were called by telephone at the time of interpretation on 09/16/2022 at 11:38 am to provider [facility] , who verbally acknowledged these results. Electronically Signed   By: Paulina Fusi M.D.   On: 09/16/2022 11:41    Procedures Procedures    Medications Ordered in ED Medications  sodium chloride flush (NS) 0.9 % injection 3 mL (0 mLs Intravenous Hold 09/16/22 1210)    ED Course/ Medical Decision Making/ A&P                             Medical Decision Making Amount and/or Complexity of Data Reviewed Labs: ordered. Radiology: ordered.  Risk Prescription drug management.   47 year old male presents to the emergency department after a staring episode, witnessed by his wife.  He is back to his neurobaseline.  Vitals are stable on arrival.  EKG is sinus rhythm.  His lab work and head CT are baseline for the patient.  Consulted with neurologist Dr. Derry Lory given his prominent neuro history.  He agrees he is high risk for seizures and we will load him with Keppra and plan for Keppra twice daily going forward.  He will continue with his outpatient follow-up with neurology for EEG studies.  Secure chat has been  sent to Dr. Pearlean Brownie and he is aware and will  continue outpatient follow-up.  Patient and wife agree with this discharge plan.  Patient at this time appears safe and stable for discharge and close outpatient follow up. Discharge plan and strict return to ED precautions discussed, patient verbalizes understanding and agreement.        Final Clinical Impression(s) / ED Diagnoses Final diagnoses:  None    Rx / DC Orders ED Discharge Orders     None         Rozelle Logan, DO 09/16/22 1516

## 2022-09-22 ENCOUNTER — Encounter: Payer: Self-pay | Admitting: Internal Medicine

## 2022-09-22 ENCOUNTER — Other Ambulatory Visit: Payer: Self-pay | Admitting: Internal Medicine

## 2022-09-22 ENCOUNTER — Ambulatory Visit: Payer: Medicaid Other | Admitting: Internal Medicine

## 2022-09-22 VITALS — BP 128/80 | HR 66 | Temp 98.1°F | Resp 16 | Ht 73.0 in | Wt 212.0 lb

## 2022-09-22 DIAGNOSIS — E119 Type 2 diabetes mellitus without complications: Secondary | ICD-10-CM | POA: Diagnosis not present

## 2022-09-22 DIAGNOSIS — I679 Cerebrovascular disease, unspecified: Secondary | ICD-10-CM

## 2022-09-22 DIAGNOSIS — I69354 Hemiplegia and hemiparesis following cerebral infarction affecting left non-dominant side: Secondary | ICD-10-CM | POA: Insufficient documentation

## 2022-09-22 DIAGNOSIS — Z6827 Body mass index (BMI) 27.0-27.9, adult: Secondary | ICD-10-CM | POA: Diagnosis not present

## 2022-09-22 DIAGNOSIS — F330 Major depressive disorder, recurrent, mild: Secondary | ICD-10-CM

## 2022-09-22 DIAGNOSIS — G40909 Epilepsy, unspecified, not intractable, without status epilepticus: Secondary | ICD-10-CM

## 2022-09-22 DIAGNOSIS — I10 Essential (primary) hypertension: Secondary | ICD-10-CM

## 2022-09-22 DIAGNOSIS — E7800 Pure hypercholesterolemia, unspecified: Secondary | ICD-10-CM | POA: Diagnosis not present

## 2022-09-22 DIAGNOSIS — Z86718 Personal history of other venous thrombosis and embolism: Secondary | ICD-10-CM

## 2022-09-22 DIAGNOSIS — Z0000 Encounter for general adult medical examination without abnormal findings: Secondary | ICD-10-CM

## 2022-09-22 NOTE — Assessment & Plan Note (Signed)
He had an acute MCA infarct in 08/2021 where he received TNK and mechanical thrombectomy of the right MCA.  He was weaned off the vent at [facility] and went for inpatient rehab at [facility].  He is still receiving outpatient PT and OT.  We will continue risk factor modification and he will followup with neurology.

## 2022-09-22 NOTE — Assessment & Plan Note (Signed)
His depression is improved with his increased lexapro. He remains on trazodone for sleep.  We will set him up to see a therapist.

## 2022-09-22 NOTE — Assessment & Plan Note (Signed)
I want him to eat healthy and continue with his exercises.

## 2022-09-22 NOTE — Assessment & Plan Note (Signed)
He had a repeat US of his leg this past year without any evidence of recurrent DVT.  He was told to remain on eliquis at that time.

## 2022-09-22 NOTE — Assessment & Plan Note (Signed)
I reviewed his notes from the ER.  He is now on keppra 500mg  BID.  We will get him in to see neurology sooner.

## 2022-09-22 NOTE — Assessment & Plan Note (Signed)
Health maintenance was discussed.  I am going to get him situated for a screening colonoscopy.  We will obtain some yearly labs.

## 2022-09-22 NOTE — Assessment & Plan Note (Signed)
His BP is well controlled.  We will continue his current medicaitons.

## 2022-09-22 NOTE — Assessment & Plan Note (Signed)
We tried to get him on a DEXCOM CGM but his insurance would not pay for this.  I will check his HgBA1c today and again our goal is to be less 7%.  We did a diabetic foot exam and he does not have sensation in his left foot and I do not think this is related to his diabetes but it is related to his stroke.  Foot care was discussed.  He has seen podiatry this past year.

## 2022-09-22 NOTE — Assessment & Plan Note (Signed)
We will obtian a FLP on him with goal LDL <55 due to his diabetes and history of stroke.

## 2022-09-22 NOTE — Assessment & Plan Note (Signed)
He remains on baclofen for his spastic hemiplegia.  He will continue with therapies.

## 2022-09-22 NOTE — Progress Notes (Signed)
Office Visit  Subjective   Patient ID: Jacob [name]   DOB: Feb 04, 1976   Age: 47 y.o.   MRN: [id number]   Chief Complaint Chief Complaint  Patient presents with   Annual Exam     History of Present Illness Jacob [name] is a 47 year old male who comes in today for his annual exam.  He is currently due for screening labs.  He last eye exam was 2-3 years ago and he states he has problems with his peripheral vision of his left eye since his stroke in 08/2021.  There is no family history of colorectal or prostate cancer.  He has never had a colonocopy but his previous PCP gave him a cologuard prior to his stroke but he never did do this.  The patient is currently still getting outpatient physical and occupation therapy where he goes once a week.  He does do home exercises daily.   He states he has some urinary hesitancy since his stroke but otherwise no other problems.  The patient does get yearly flu vaccines.  He has had 2 COVID-19 vaccines but no boosters.  There is no family history of CAD or strokes.  The patient remains on eliquis 5mg  BID but is not on an ASA.  Jacob [name] is a 47 y.o. male who was hospitalized this past year at [facility] then subsequently to [facility] where he was initially admitted in 08/2021 for acute stroke.  He was brought in to the [facility] ER where a code stroke was called and he was noted before to have left sided facial droop and left side weakness.  A CTA of his head was obtained that showed a RM1 occlusion.  TNK was discussed and given and the patient was ultimately taken for a mechanical thrombectomy of the right MCA.  Unfortunately, the patient developed cerebral edema and underwent a a right side decompressive hemicrainectomy with placement of a bone flap in his abdomen.  The etiology of his stroke was not certain but they felt this was either cryptogenic versus intracranial atherosclerosis.  An TEE was done and was consistent with intrapulmonary shunts.  His  hospital course was complicated by a right peroneal DVT where he was placed on eliquis.  He was left intubated after his surgery and they could not wean him from the vent at [facility].  He underwent trach placement.  He has a MRSA pneumonia at that time.  The patient was transferred to [facility] on 10/08/2021.  We were able to wean him off the vent and he received therapies and eventually started a diet.  He completed a stay at [facility] rehab from 02/14/2022 until 03/07/2022.  They noted some left shoulder subluxation which was treated with taping and ROM and sling when not mobilizing.  He was started on baclofen 10mg  twice daily due to worsening left upper extremity and left lower extremity tone.  They discontinued his Ritalin during his stay as well.  THe was started on lantus for better blood sugar control.  They note that the patient had improvement in his activity tolerance, balance, postural control and ability to compensate for deficits.  They discharged him home and he is currently receiving outpatient therapies as described.  Since then, he has been followed by neurology with his last visit in 04/2022.  The patient had his bone flap replaced on 02/09/2022 by Dr. Lovell Sheehan.  A  follow-up CT scan of the head on 02/12/2022 showed  stable appearance of his right MCA and PCA infarcts without acute abnormality.  He does have significant residual spastic left hemiplegia.  Neurology recommended continued risk factor modfication.  Unfortunately, the patient was seen about a week ago on 09/16/2022 in the [facility] ER for possible seizure episode.  He presented to the emergency department with a staring episode that last 20-30 seconds.  A performed a repeat CT scan of his head and this showed no acute finding but an old right MCA territory infarction with atrophy and encephalomalacia and previous craniectomy with replacement of the bone flap.  They consulted with neurology and loaded him with keppra and he is  currently on keppra 500mg  BID.  His wife has not noticed anymore staring episodes.  Neurology wanted to see him back sooner and they are requesting this visit.    I did see him in 05/2022 for worsening depression.  Again, he had his stroke in 08/2021 and was started on depression meds in the hospital.  We wanted him to continue on trazodone and I increased his lexapro from 20mg  to 30mg  daily in 04/2022.  He states that this did help and today he states his depression is mild today.  He has been referred for a therapist but they have not contacted him yet.  On his previous visit in 04/2022, his depression was moderate and he was having some episodes of crying.  Over the interim, today he states his depression is mild and he is not having anymore crying.  The patient has had problems with intermittent fluctuating depression since he was admitted to [facility] after his stroke. He is not having anxiety and no panic attacks. The patient is currently on Lexapro 30mg  daily and trazodone 50mg  qhs for sleep which does help. H He denies difficulty concentrating, fatigue, suicidal ideation, homicidal ideation, loss of appetite, social withdrawal, and loss of interest in pleasurable activities. This patient feels that Jacob [name] is unable to care for himself. He still has his left side weakness where he is wheelchair bound. He currently lives with his family. He has no significant prior history of mental health disorders.    The patient is a 47 year old male who returns for a follow-up visit of his T2 diabetes. He states he was diagnosed with diabetes in 2005.  On his last visit, his diabetes was not controlled and we did start him on metformin. We tried to get him started on [facility] but his insurance would not pay for that.  Since the last visit, there have been no problems. He is currently on:  Lantus 12 Units subcut daily, metformin 500mg  BID and humalog SSI TID with 4-7 units at each meal.  Jacob [name] is not walking as much as  they would like. Jacob [name] specifically denies unexplained abdominal pain, nausea or vomiting. He does check his blood sugars 2 times per day where they run 170-220.  His last HgBA1c was done 3 months ago and was 8.6%. He has no long term complications of diabetic retinopathy, nephropathy, or neuropathy.     The patient is a 47 year old male who presents for a follow-up evaluation of hypertension.  Since his last visit, there has been no problems.  The patient has been checking her blood pressure at home. The patient's blood pressure has ranged systollically 120's. The patient's current medications include: amlodipine 10mg  daily, metoprolol 25mg  BID, clonidine 0.3mg  TD weekly, and lisinopril 40mg  daily. The patient has been tolerating her medications well. The patient denies  any headache, visual changes, dizziness, lightheadness, chest pain, shortness of breath, weakness/numbness, and edema.   The patient also returns today for routine followup on his cholesterol. Overall, he states he is doing well and is without any complaints or problems at this time. He specifically denies nausea, vomiting, diarrhea, myalgias, and fatigue. He remains on atorvastatin 40mg  qhs. He is fasting in anticipation for labs today.         Past Medical History Past Medical History:  Diagnosis Date   Allergy    Diabetes mellitus without complication (HCC)    GERD (gastroesophageal reflux disease)    Hypertension    Paralysis (HCC)    Stroke (HCC)      Allergies Allergies  Allergen Reactions   Hydrochlorothiazide Other (See Comments)    Bilateral muscle cramping     Medications  Current Outpatient Medications:    amLODipine (NORVASC) 10 MG tablet, Take 1 tablet (10 mg total) by mouth daily., Disp: 90 tablet, Rfl: 1   atorvastatin (LIPITOR) 40 MG tablet, Take 1 tablet (40 mg total) by mouth daily. (Patient taking differently: Take 40 mg by mouth at bedtime.), Disp: 90 tablet, Rfl: 1   baclofen (LIORESAL) 10 MG  tablet, Take 1 tablet (10 mg total) by mouth 3 (three) times daily., Disp: 90 each, Rfl: 3   blood glucose meter kit and supplies KIT, Dispense based on patient and insurance preference. Use up to four times daily as directed., Disp: 1 each, Rfl: 0   cloNIDine (CATAPRES - DOSED IN MG/24 HR) 0.3 mg/24hr patch, Place 1 patch (0.3 mg total) onto the skin once a week., Disp: 4 patch, Rfl: 6   ELIQUIS 5 MG TABS tablet, TAKE 1 TABLET BY MOUTH TWICE A DAY, Disp: 180 tablet, Rfl: 1   escitalopram (LEXAPRO) 20 MG tablet, Take 1.5 tablets (30 mg total) by mouth daily. (Patient taking differently: Take 30 mg by mouth at bedtime.), Disp: 45 tablet, Rfl: 2   glucose blood (RELION TRUE METRIX TEST STRIPS) test strip, Use as instructed, Disp: 100 each, Rfl: 12   insulin glargine (LANTUS) 100 UNIT/ML injection, Inject 0.1 mLs (10 Units total) into the skin daily. (Patient taking differently: Inject 12 Units into the skin daily.), Disp: 10 mL, Rfl: 0   insulin lispro (HUMALOG) 100 UNIT/ML KwikPen, Inject 0-20 Units into the skin 3 (three) times daily. Inject 0-20 Units into the skin 3 (three) times daily with meals. CBG < 70 call MD. CBG 70-120 give 0 units; 121-150 give 3 units; 151-200 give 4 units, 201-250 give 7 units; 251-300 give 11 units; 301-350 give 15 units; 351-400 give 20 units; greater than 400 call MD, Disp: 15 mL, Rfl: 1   Lancets Ultra Fine MISC, Use as directed, Disp: 100 each, Rfl: 11   levETIRAcetam (KEPPRA) 500 MG tablet, Take 1 tablet (500 mg total) by mouth 2 (two) times daily., Disp: 60 tablet, Rfl: 1   lisinopril (ZESTRIL) 40 MG tablet, Take 1 tablet (40 mg total) by mouth daily., Disp: 90 tablet, Rfl: 1   metFORMIN (GLUCOPHAGE) 500 MG tablet, Take 1 tablet (500 mg total) by mouth 2 (two) times daily with a meal., Disp: 180 tablet, Rfl: 1   metoprolol tartrate (LOPRESSOR) 25 MG tablet, Take 1 tablet (25 mg total) by mouth 2 (two) times daily., Disp: 60 tablet, Rfl: 11   sildenafil (VIAGRA) 50 MG  tablet, Take 1 tablet (50 mg total) by mouth daily as needed for erectile dysfunction., Disp: 10 tablet, Rfl: 2  traZODone (DESYREL) 50 MG tablet, Take 1 tablet (50 mg total) by mouth at bedtime., Disp: 90 tablet, Rfl: 1   Review of Systems Review of Systems  Constitutional:  Negative for chills, fever, malaise/fatigue and weight loss.  HENT:  Negative for hearing loss and tinnitus.   Eyes:  Positive for blurred vision. Negative for double vision.  Respiratory:  Negative for cough, hemoptysis, shortness of breath and wheezing.   Cardiovascular:  Negative for chest pain, palpitations and leg swelling.  Gastrointestinal:  Negative for abdominal pain, blood in stool, constipation, diarrhea, melena, nausea and vomiting.  Genitourinary:  Negative for frequency and hematuria.  Musculoskeletal:  Negative for myalgias.  Skin:  Negative for itching and rash.  Neurological:  Positive for weakness. Negative for dizziness, seizures and headaches.  Psychiatric/Behavioral:  Positive for depression. The patient is not nervous/anxious and does not have insomnia.        Objective:    Vitals BP 128/80   Pulse 66   Temp 98.1 F (36.7 C)   Resp 16   Ht 6\' 1"  (1.854 m)   Wt 212 lb (96.2 kg)   SpO2 97%   BMI 27.97 kg/m    Physical Examination Physical Exam Constitutional:      Appearance: Normal appearance. He is not ill-appearing.  HENT:     Head: Normocephalic and atraumatic.     Right Ear: Tympanic membrane, ear canal and external ear normal.     Left Ear: Tympanic membrane, ear canal and external ear normal.     Nose: Nose normal. No congestion or rhinorrhea.     Mouth/Throat:     Mouth: Mucous membranes are moist.     Pharynx: Oropharynx is clear. No oropharyngeal exudate or posterior oropharyngeal erythema.  Eyes:     General: No scleral icterus.    Conjunctiva/sclera: Conjunctivae normal.     Pupils: Pupils are equal, round, and reactive to light.  Neck:     Vascular: No carotid  bruit.  Cardiovascular:     Rate and Rhythm: Normal rate and regular rhythm.     Pulses: Normal pulses.     Heart sounds: No murmur heard.    No friction rub. No gallop.  Pulmonary:     Effort: Pulmonary effort is normal. No respiratory distress.     Breath sounds: No wheezing, rhonchi or rales.  Abdominal:     General: Abdomen is flat. Bowel sounds are normal. There is no distension.     Palpations: Abdomen is soft.     Tenderness: There is no abdominal tenderness.  Musculoskeletal:     Cervical back: Neck supple. No tenderness.     Right lower leg: No edema.     Left lower leg: No edema.  Lymphadenopathy:     Cervical: No cervical adenopathy.  Skin:    General: Skin is warm and dry.     Findings: No rash.  Neurological:     General: No focal deficit present.     Mental Status: He is alert and oriented to person, place, and time.     Comments: He has left sided weakness  Psychiatric:        Mood and Affect: Mood normal.        Behavior: Behavior normal.        Assessment & Plan:   Cerebrovascular disease He had an acute MCA infarct in 08/2021 where he received TNK and mechanical thrombectomy of the right MCA.  He was weaned off the vent at [facility]  and went for inpatient rehab at [facility].  He is still receiving outpatient PT and OT.  We will continue risk factor modification and he will followup with neurology.  Essential hypertension His BP is well controlled.  We will continue his current medicaitons.  Diabetes mellitus without complication (HCC) We tried to get him on a DEXCOM CGM but his insurance would not pay for this.  I will check his HgBA1c today and again our goal is to be less 7%.  We did a diabetic foot exam and he does not have sensation in his left foot and I do not think this is related to his diabetes but it is related to his stroke.  Foot care was discussed.  He has seen podiatry this past year.  Spastic hemiplegia of left nondominant side as late effect  of cerebral infarction [facility]) He remains on baclofen for his spastic hemiplegia.  He will continue with therapies.  Seizure disorder [facility]) I reviewed his notes from the ER.  He is now on keppra 500mg  BID.  We will get him in to see neurology sooner.  Annual physical exam Health maintenance was discussed.  I am going to get him situated for a screening colonoscopy.  We will obtain some yearly labs.  BMI 27.0-27.9,adult I want him to eat healthy and continue with his exercises.  History of DVT (deep vein thrombosis) He had a repeat US of his leg this past year without any evidence of recurrent DVT.  He was told to remain on eliquis at that time.  Hyperlipidemia We will obtian a FLP on him with goal LDL <55 due to his diabetes and history of stroke.  Mild episode of recurrent major depressive disorder (HCC) His depression is improved with his increased lexapro. He remains on trazodone for sleep.  We will set him up to see a therapist.    Return in about 3 months (around 12/22/2022).   [name], MD

## 2022-09-23 ENCOUNTER — Ambulatory Visit: Payer: Medicaid Other | Admitting: Occupational Therapy

## 2022-09-23 ENCOUNTER — Ambulatory Visit: Payer: Medicaid Other | Attending: Internal Medicine | Admitting: Physical Therapy

## 2022-09-23 DIAGNOSIS — R262 Difficulty in walking, not elsewhere classified: Secondary | ICD-10-CM

## 2022-09-23 DIAGNOSIS — R2681 Unsteadiness on feet: Secondary | ICD-10-CM | POA: Diagnosis present

## 2022-09-23 DIAGNOSIS — R278 Other lack of coordination: Secondary | ICD-10-CM | POA: Diagnosis present

## 2022-09-23 DIAGNOSIS — R269 Unspecified abnormalities of gait and mobility: Secondary | ICD-10-CM

## 2022-09-23 DIAGNOSIS — M6281 Muscle weakness (generalized): Secondary | ICD-10-CM

## 2022-09-23 LAB — CMP14 + ANION GAP
ALT: 14 IU/L (ref 0–44)
AST: 22 IU/L (ref 0–40)
Albumin/Globulin Ratio: 1.7 (ref 1.2–2.2)
Albumin: 4.3 g/dL (ref 4.1–5.1)
Alkaline Phosphatase: 153 IU/L — ABNORMAL HIGH (ref 44–121)
Anion Gap: 15 mmol/L (ref 10.0–18.0)
BUN/Creatinine Ratio: 14 (ref 9–20)
BUN: 11 mg/dL (ref 6–24)
Bilirubin Total: 1.1 mg/dL (ref 0.0–1.2)
CO2: 27 mmol/L (ref 20–29)
Calcium: 9.7 mg/dL (ref 8.7–10.2)
Chloride: 101 mmol/L (ref 96–106)
Creatinine, Ser: 0.81 mg/dL (ref 0.76–1.27)
Globulin, Total: 2.6 g/dL (ref 1.5–4.5)
Glucose: 149 mg/dL — ABNORMAL HIGH (ref 70–99)
Potassium: 3.7 mmol/L (ref 3.5–5.2)
Sodium: 143 mmol/L (ref 134–144)
Total Protein: 6.9 g/dL (ref 6.0–8.5)
eGFR: 108 mL/min/{1.73_m2} (ref >59)

## 2022-09-23 LAB — TSH: TSH: 0.781 u[IU]/mL (ref 0.450–4.500)

## 2022-09-23 LAB — PSA: Prostate Specific Ag, Serum: 1.2 ng/mL (ref 0.0–4.0)

## 2022-09-23 LAB — HEMOGLOBIN A1C
Est. average glucose Bld gHb Est-mCnc: 154 mg/dL
Hgb A1c MFr Bld: 7 % — ABNORMAL HIGH (ref 4.8–5.6)

## 2022-09-23 LAB — CBC WITH DIFFERENTIAL/PLATELET
Basophils Absolute: 0 10*3/uL (ref 0.0–0.2)
Basos: 0 %
EOS (ABSOLUTE): 0.1 10*3/uL (ref 0.0–0.4)
Eos: 2 %
Hematocrit: 42 % (ref 37.5–51.0)
Hemoglobin: 14 g/dL (ref 13.0–17.7)
Immature Grans (Abs): 0 10*3/uL (ref 0.0–0.1)
Immature Granulocytes: 0 %
Lymphocytes Absolute: 2.2 10*3/uL (ref 0.7–3.1)
Lymphs: 40 %
MCH: 28.2 pg (ref 26.6–33.0)
MCHC: 33.3 g/dL (ref 31.5–35.7)
MCV: 85 fL (ref 79–97)
Monocytes Absolute: 0.4 10*3/uL (ref 0.1–0.9)
Monocytes: 7 %
Neutrophils Absolute: 2.8 10*3/uL (ref 1.4–7.0)
Neutrophils: 51 %
Platelets: 202 10*3/uL (ref 150–450)
RBC: 4.96 x10E6/uL (ref 4.14–5.80)
RDW: 15.5 % — ABNORMAL HIGH (ref 11.6–15.4)
WBC: 5.5 10*3/uL (ref 3.4–10.8)

## 2022-09-23 LAB — LIPID PANEL
Chol/HDL Ratio: 3.1 ratio (ref 0.0–5.0)
Cholesterol, Total: 135 mg/dL (ref 100–199)
HDL: 43 mg/dL (ref >39)
LDL Chol Calc (NIH): 70 mg/dL (ref 0–99)
Triglycerides: 120 mg/dL (ref 0–149)
VLDL Cholesterol Cal: 22 mg/dL (ref 5–40)

## 2022-09-23 LAB — MICROALBUMIN / CREATININE URINE RATIO
Creatinine, Urine: 108.7 mg/dL
Microalb/Creat Ratio: 5 mg/g creat (ref 0–29)
Microalbumin, Urine: 5 ug/mL

## 2022-09-23 NOTE — Therapy (Signed)
OUTPATIENT PHYSICAL THERAPY NEURO TREATMENT/Re-certification   Patient Name: Jacob [name] MRN: [id number] DOB:Jan 21, 1976, 47 y.o., male Today's Date: 09/23/2022   PCP: Dr. [name] REFERRING PROVIDER: Dr. Michel Santee Eyk  END OF SESSION:  PT End of Session - 09/23/22 1041     Visit Number 13    Number of Visits 36    Date for PT Re-Evaluation 11/04/22    Authorization Type [place] Medicaid    Progress Note Due on Visit 20    PT Start Time 1018    PT Stop Time 1100    PT Time Calculation (min) 42 min    Equipment Utilized During Treatment Gait belt    Activity Tolerance Patient tolerated treatment well    Behavior During Therapy WFL for tasks assessed/performed                    Past Medical History:  Diagnosis Date   Allergy    Diabetes mellitus without complication (HCC)    GERD (gastroesophageal reflux disease)    Hypertension    Paralysis (HCC)    Stroke [facility])    Past Surgical History:  Procedure Laterality Date   APPENDECTOMY     BUBBLE STUDY  10/02/2021   Procedure: BUBBLE STUDY;  Surgeon: Pricilla Riffle, MD;  Location: [facility] ENDOSCOPY;  Service: Cardiovascular;;   CRANIOPLASTY Right 02/09/2022   Procedure: Craniectomy with Replacement of Bone Flap From the Abdomen;  Surgeon: Tressie Stalker, MD;  Location: [facility] OR;  Service: Neurosurgery;  Laterality: Right;   CRANIOTOMY Right 09/15/2021   Procedure: RIGHT DECOMPRESSIVE CRANIOTOMY, PLACEMENT OF BONE FLAP IN ABDOMEN;  Surgeon: Tressie Stalker, MD;  Location: [facility] OR;  Service: Neurosurgery;  Laterality: Right;   IR CT HEAD LTD  09/12/2021   IR PERCUTANEOUS ART THROMBECTOMY/INFUSION INTRACRANIAL INC DIAG ANGIO  09/12/2021   IR US GUIDE VASC ACCESS RIGHT  09/12/2021   LAPAROSCOPIC APPENDECTOMY  10/24/2013   Procedure: APPENDECTOMY LAPAROSCOPIC;  Surgeon: Kandis Cocking, MD;  Location: [facility] ORS;  Service: General;;   RADIOLOGY WITH ANESTHESIA N/A 09/12/2021   Procedure: IR WITH ANESTHESIA;  Surgeon: Julieanne Cotton,  MD;  Location: [facility] OR;  Service: Radiology;  Laterality: N/A;   TEE WITHOUT CARDIOVERSION N/A 10/02/2021   Procedure: TRANSESOPHAGEAL ECHOCARDIOGRAM (TEE);  Surgeon: Pricilla Riffle, MD;  Location: [facility] ENDOSCOPY;  Service: Cardiovascular;  Laterality: N/A;   Patient Active Problem List   Diagnosis Date Noted   Annual physical exam 09/22/2022   Cerebrovascular disease 09/22/2022   History of DVT (deep vein thrombosis) 09/22/2022   Spastic hemiplegia of left nondominant side as late effect of cerebral infarction (HCC) 09/22/2022   Seizure disorder (HCC) 09/22/2022   BMI 27.0-27.9,adult 09/22/2022   Mild episode of recurrent major depressive disorder (HCC) 06/15/2022   Status post craniectomy 02/09/2022   Urinary retention 09/18/2021   Vitamin D deficiency 11/04/2020   Hyperlipidemia 03/28/2018   Diabetes mellitus without complication (HCC) 02/23/2018   Essential hypertension 02/23/2018    ONSET DATE: 08/2021  REFERRING DIAG:  Diagnosis  I63.9 (ICD-10-CM) - Cerebral infarction, unspecified  R53.81 (ICD-10-CM) - Other malaise    THERAPY DIAG:  Muscle weakness (generalized)  Abnormality of gait and mobility  Other lack of coordination  Difficulty in walking, not elsewhere classified  Unsteadiness on feet  Rationale for Evaluation and Treatment: Rehabilitation  SUBJECTIVE:  SUBJECTIVE STATEMENT: Patient reports that he is doing well. No pain reported. Pt states that he was able to get in and out of [employer] truck with friends over the weekend, and was able to bear weight through LLE to access cab. Wife states that she was not present when pt climbed into truck.     Pt accompanied by: significant other- Wife  PERTINENT HISTORY: Patient is a 47 year old male with recent history of CVA  in April 2023  and then elective cranioplasty in September 2023 with subsequent inpatient rehab and then home health services. Patient has left hemiparesis.   PAIN:  Are you having pain? No  PRECAUTIONS: Fall  WEIGHT BEARING RESTRICTIONS: No  FALLS: Has patient fallen in last 6 months? Yes. 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   RADIATION DOSE REDUCTION: This exam was performed according to the departmental dose-optimization program which includes automated exposure control, adjustment of the mA and/or kV according to patient size and/or use of iterative reconstruction technique.   COMPARISON:  09/19/2021   FINDINGS: Brain: Diffuse cytotoxic edema is noted throughout the distribution of the right MCA and PCA watershed region similar to that seen on the prior exam. No parenchymal hemorrhage is seen. No new focal infarct is seen.   Vascular: No hyperdense vessel or unexpected calcification.   Skull: Large calvarial defect there is noted on the right stable from the prior study.   Sinuses/Orbits: Paranasal sinuses are within normal limits.   Other: Gastric  catheter and feeding catheter are noted extending through the nose.   IMPRESSION: Right MCA/PCA infarct stable in appearance from the previous exam. No acute hemorrhage is noted.   Right-sided hemi craniectomy stable from the prior exam. No new focal abnormality is noted.     Electronically Signed   By: Alcide Clever M.D.   On: 09/24/2021 02:47  COGNITION: Overall cognitive status: Impaired-decreased awareness of functional limitations   SENSATION: Light touch: Impaired - decreased  COORDINATION:  Left side hemiparesis  EDEMA:  None observed  MUSCLE TONE: LLE: Hypotonic   POSTURE: rounded shoulders and forward head  LOWER EXTREMITY ROM:     Active  Right Eval Left Eval  Hip flexion    Hip extension    Hip abduction    Hip adduction    Hip internal rotation    Hip external rotation    Knee flexion    Knee extension    Ankle dorsiflexion    Ankle plantarflexion    Ankle inversion    Ankle eversion     (Blank rows = not tested)  LOWER EXTREMITY MMT:    MMT Right Eval Left Eval  Hip flexion 5  2-  Hip extension 5 2-  Hip abduction 5 2-  Hip adduction 5 2-  Hip internal rotation 5 2-  Hip external rotation 5 2-  Knee flexion 5 2-  Knee extension 5 2-  Ankle dorsiflexion 5  0   Ankle plantarflexion    Ankle inversion    Ankle eversion    (Blank rows = not tested)  BED MOBILITY:  Not tested  TRANSFERS: Assistive device utilized: Hemi walker  Sit to stand: Mod A Stand to sit: Mod A Chair to chair: Mod A Floor:  Not tested   STAIRS: *Patient adamantly wanted to attempt today Level of Assistance: Max A Stair Negotiation Technique: Step to Pattern with Bilateral Rails Number of Stairs: 1  Height of Stairs: 6"  Comments: Patient wanted to attempt steps continuously requesting to try- He was able to stand from w/c with mod assist using right arm for support on armrest of chair with gait belt. Once in standing he was able to step up with right  LE with physical assist to block his left LE yet able to flex left hip/knee enough to clear 1st step- positioned back in chair.   GAIT: Gait pattern: step to pattern Distance walked: 8 feet Assistive device utilized: Hemi walker Level of assistance: Mod A Comments: Patient required VC for seqencing and use of left AFO today. - fatigues very quickly and left knee buckling with weight bearing.   FUNCTIONAL TESTS:  5 times sit to stand: Patient required mod Assist to stand and unable to complete on his own.  PATIENT SURVEYS:  FOTO 36; predicted goal= 47  TODAY'S TREATMENT:                                                                                                                              DATE: 09/23/22   Nustep level 2 BLE and RUE x 3.5 min and BLE only x 1.5 min cues for attention to the LLE and full ROM throughout.  WC mobility through gym without assist from PT 2x 84ft.   10 Meter Walk Test: Patient instructed to walk 10 meters (32.8 ft) as quickly and as safely as possible at their normal speed x2 and at a fast speed x2. Time measured from 2 meter mark to 8 meter mark to accommodate ramp-up and ramp-down.  Normal speed: 3:49min (0.080m/s) Cut off scores: <0.4 m/s = household Ambulator, 0.4-0.8 m/s = limited community Ambulator, >0.8 m/s = community Ambulator, >1.2 m/s = crossing a street, <1.0 = increased fall risk MCID 0.05 m/s (small), 0.13 m/s (moderate), 0.06 m/s (significant)  (ANPTA Core Set of Outcome Measures for Adults with Neurologic Conditions, 2018)  Pt performed 5 time sit<>stand (5xSTS): 35.37sce(46.79, 23.95) sec (>15 sec indicates increased fall risk) mod assist from PT on first bout due to lateral tipping of chair requiring assist to return sitting. CGA on second bout.   Gait training with RW x 62ft and 44ft. With min  fading to mod assist due to increasing L/anterior LOB with very poor awareness of LOB.  Moderate cues for sequencing of AD and gait pattern to  prevent LOB to the L.   Throughout session CGA for sit<>stand except with initial 5xSTS requiring min-mod assist to prevent LOB with lateral LOB due to poor positioning of RUE on arm chair causing lateral tipping of chair.   PATIENT EDUCATION: Education details: PT plan of care and HEP Person educated: Patient and Spouse Education method: Explanation, Demonstration, Tactile cues, Verbal cues, and Handouts Education comprehension: verbalized understanding, returned demonstration, verbal cues required, tactile cues required, and needs further education  HOME EXERCISE PROGRAM: Access Code: [email or web address] URL: [email or web address] Date: 06/23/2022 Prepared by: Maureen Ralphs  Exercises - Supine Quad Set  - 1 x daily - 7 x weekly - 3 sets - 10 reps - Seated Long Arc Quad  - 1 x daily - 7 x weekly - 3 sets - 10 reps - Seated March  - 1 x daily - 7 x weekly - 3 sets - 10 reps - Supine Bridge  - 1 x daily - 7 x weekly - 3 sets - 10 reps - Sit to Stand with Counter Support  - 1 x daily - 7 x weekly - 3 sets - 10 reps  GOALS: Goals reviewed with patient? Yes  SHORT TERM GOALS: Target date: 08/04/2022  Pt will be independent with HEP in order to improve strength and balance in order to decrease fall risk and improve function at home and work.  Baseline: EVAL-Patient with no formal HEP in place Goal status: INITIAL  2.  Patient will perform all stand pivot transfers with min Assist using hemi-walker for improved functional independence with mobility in home.  Baseline: EVAL= Mod A with transfers Goal status: INITIAL    LONG TERM GOALS: Target date: 09/15/2022  Pt will improve FOTO to target score of 47  to display perceived improvements in ability to complete ADL's.  Baseline: EVAL= 36 Goal status: INITIAL  2.  Pt will achieve 5x STS in less than 60 seconds in order to demonstrate clinically significant improvement in LE strength. Baseline: EVAL- Mod A with sit to stand  transfer Goal status: MET INITIAL 3.  Patient will ambulate > 50 feet with hemiwalker using step to gait and CGA for improved household mobility.  Baseline: EVAL- 8 feet with hemiwalker and mod A Goal status: IN PROGRESS  4.  Patient will perform all transfers with CGA  including w/c to various surfaces- toilet, bed, chairs using hemiwalker for improved functional independence with mobility.  Baseline:  EVAL- Mod asst with all transfers Goal status: IN PROGRESS   ASSESSMENT:  CLINICAL IMPRESSION:  Patient presents with good motivation for completion of physical therapy activity. PT treatment focused on dynamic gait transfer training to force use of LLE. Pt noted to have decreased awareness of lateral/anterior LOB on this day. Noted to have improved safety with transfers with decreased time on 5xSTS to 35 sec. and ability to remain on feet for gait up to 8ft on this day; but mod assist required for last 74ft due to fatigue. Patient's condition has the potential to improve in response to therapy. Maximum improvement is yet to be obtained. The anticipated improvement is attainable and reasonable in a generally predictable time. Pt will continue to benefit from skilled physical therapy intervention to address impairments, improve QOL, and attain therapy goals.       OBJECTIVE IMPAIRMENTS: Abnormal gait, cardiopulmonary status limiting  activity, decreased activity tolerance, decreased balance, decreased cognition, decreased coordination, decreased endurance, decreased mobility, difficulty walking, decreased strength, decreased safety awareness, impaired perceived functional ability, impaired sensation, and impaired UE functional use.   ACTIVITY LIMITATIONS: carrying, lifting, bending, standing, squatting, stairs, transfers, and bed mobility  PARTICIPATION LIMITATIONS: meal prep, cleaning, laundry, driving, shopping, community activity, occupation, and yard work  PERSONAL FACTORS: Time since  onset of injury/illness/exacerbation and 1-2 comorbidities: HTN, DM  are also affecting patient's functional outcome.   REHAB POTENTIAL: Good  CLINICAL DECISION MAKING: Evolving/moderate complexity  EVALUATION COMPLEXITY: Moderate  PLAN:  PT FREQUENCY: 1-2x/week  PT DURATION: 12 weeks  PLANNED INTERVENTIONS: Therapeutic exercises, Therapeutic activity, Neuromuscular re-education, Balance training, Gait training, Patient/Family education, Self Care, Joint mobilization, Stair training, Orthotic/Fit training, DME instructions, Dry Needling, Electrical stimulation, Wheelchair mobility training, Spinal mobilization, Cryotherapy, Moist heat, Splintting, Taping, and Manual therapy  PLAN FOR NEXT SESSION:   Review and progress LE strengthening. Transfer training for safe mobility, Gait training in // bars or with hemiwalker  Add to HEP as appropriate.   Grier Rocher PT, DPT  Physical Therapist - [facility]  1:07 PM 09/23/22

## 2022-09-23 NOTE — Therapy (Addendum)
OUTPATIENT OCCUPATIONAL THERAPY NEURO TREATMENT NOTE  Patient Name: Jacob [name] MRN: [id number] DOB:Mar 16, 1976, 47 y.o., male Today's Date: 09/23/2022  PCP: Jacob [name] REFERRING PROVIDER: Dr. Michel Santee [name]  END OF SESSION:  OT End of Session - 09/23/22 1210     Visit Number 13    Number of Visits 24    Date for OT Re-Evaluation 12/08/22    Authorization Type **Approved for additional visits as of 09/23/2022. D/C at the 4th approved visit*    Authorization Time Period *09/23/2022: 1 of 4 completed*    OT Start Time 0930    OT Stop Time 1015    OT Time Calculation (min) 45 min    Activity Tolerance Patient tolerated treatment well    Behavior During Therapy WFL for tasks assessed/performed             Past Medical History:  Diagnosis Date   Allergy    Diabetes mellitus without complication (HCC)    GERD (gastroesophageal reflux disease)    Hypertension    Paralysis (HCC)    Stroke [facility])    Past Surgical History:  Procedure Laterality Date   APPENDECTOMY     BUBBLE STUDY  10/02/2021   Procedure: BUBBLE STUDY;  Surgeon: Jacob Riffle, MD;  Location: [facility] ENDOSCOPY;  Service: Cardiovascular;;   CRANIOPLASTY Right 02/09/2022   Procedure: Craniectomy with Replacement of Bone Flap From the Abdomen;  Surgeon: Jacob Stalker, MD;  Location: [facility] OR;  Service: Neurosurgery;  Laterality: Right;   CRANIOTOMY Right 09/15/2021   Procedure: RIGHT DECOMPRESSIVE CRANIOTOMY, PLACEMENT OF BONE FLAP IN ABDOMEN;  Surgeon: Jacob Stalker, MD;  Location: [facility] OR;  Service: Neurosurgery;  Laterality: Right;   IR CT HEAD LTD  09/12/2021   IR PERCUTANEOUS ART THROMBECTOMY/INFUSION INTRACRANIAL INC DIAG ANGIO  09/12/2021   IR US GUIDE VASC ACCESS RIGHT  09/12/2021   LAPAROSCOPIC APPENDECTOMY  10/24/2013   Procedure: APPENDECTOMY LAPAROSCOPIC;  Surgeon: Jacob Cocking, MD;  Location: [facility] ORS;  Service: General;;   RADIOLOGY WITH ANESTHESIA N/A 09/12/2021   Procedure: IR WITH ANESTHESIA;  Surgeon:  Jacob Cotton, MD;  Location: [facility] OR;  Service: Radiology;  Laterality: N/A;   TEE WITHOUT CARDIOVERSION N/A 10/02/2021   Procedure: TRANSESOPHAGEAL ECHOCARDIOGRAM (TEE);  Surgeon: Jacob Riffle, MD;  Location: [facility] ENDOSCOPY;  Service: Cardiovascular;  Laterality: N/A;   Patient Active Problem List   Diagnosis Date Noted   Annual physical exam 09/22/2022   Cerebrovascular disease 09/22/2022   History of DVT (deep vein thrombosis) 09/22/2022   Spastic hemiplegia of left nondominant side as late effect of cerebral infarction (HCC) 09/22/2022   Seizure disorder (HCC) 09/22/2022   BMI 27.0-27.9,adult 09/22/2022   Mild episode of recurrent major depressive disorder (HCC) 06/15/2022   Status post craniectomy 02/09/2022   Urinary retention 09/18/2021   Vitamin D deficiency 11/04/2020   Hyperlipidemia 03/28/2018   Diabetes mellitus without complication (HCC) 02/23/2018   Essential hypertension 02/23/2018   ONSET DATE: April 2023  REFERRING DIAG: I63.9 (ICD-10-CM) - Cerebral infarction, unspecified   THERAPY DIAG:   Muscle weakness (generalized)  Rationale for Evaluation and Treatment: Rehabilitation  SUBJECTIVE: Pt reports MOD I to enter/exit hospital bed, spouse reports has not tried their 24" high queen sized bed. Pt present wihtout resting hand splint.  SUBJECTIVE STATEMENT: Pt accompanied by: self and significant other  PERTINENT HISTORY: Per chart, Jacob [name] is a 47 y.o. male with a history of prior CVA with decompressive craniectomy in April 2023.  He has a history of right peroneal DVT on Eliquis.  He has left hemiparesis from stroke.   Per spouse, pt spent some time in [facility], then 2 months at [facility] (SNF). He presented for elective cranioplasty in Sept 2023.  Pt was admitted to rehab 02/14/2022 for inpatient therapies following cranioplasty, and participated in [facility] therapies until recently.      PRECAUTIONS: Fall  WEIGHT BEARING RESTRICTIONS:  No  PAIN: 09/15/22: Pt reports 3/10 pain   Are you having pain? Yes: NPRS scale: 0 (rest), when stretching 2/10 Pain location: L shoulder sublux Pain description: achy Aggravating factors: movement of the L shoulder  Relieving factors: lying down and gentle stretching   FALLS: Has patient fallen in last 6 months? Yes. Number of falls 1  LIVING ENVIRONMENT: Lives with: lives with their spouse and 83 y/o son, 1 cousin, and 1 friend Lives in: 2 level with master bedroom on the main level, pt resides only on 1st level Stairs: 1 step no rail to enter, pt does have small ramp Has following equipment at home: Hemi walker, Wheelchair (manual), Shower bench, bed side commode, Grab bars, Ramped entry, and platform walker (grab bar by the toilet but not in the shower.  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  PATIENT GOALS: Pt wants to get back to walking and being as indep as possible.   OBJECTIVE:   HAND DOMINANCE: Right  ADLs: Overall ADLs: HHA Mon-Fri 5 hours/day; spouse is pt's primary caregiver Transfers/ambulation related to ADLs: min A sit to stand pivot from wc using gait belt  Eating: assist to cut food  Grooming: min A to manage facial hair UB Dressing: max A LB Dressing: max-dep A Toileting: dep, wears adult briefs.  Spouse reports pt has just started transitioning to use of BSC over the toilet vs voiding in a brief Bathing: max A with sponge/bed bath (in the process of getting bathroom re-done to enable pt to take a shower) Tub Shower transfers: N/A at this time; pt states he doesn't trust himself with the glass doors. Equipment:  see above  IADLs: Shopping: dep Light housekeeping: dep Meal Prep: dep Community mobility: wc dep  Medication management: dep Financial management: dep Handwriting:  Pt is R hand dominant, NT  MOBILITY STATUS:  Pt reports he can amb with hemi-walker 10-20 ft with max A (per  spouse) and someone following with a wc behind him  POSTURE COMMENTS:  rounded shoulders, forward head, and L sided hemiplegia (L shoulder subluxation ~1 thumb breadth) Sitting balance:  able to sit edge of mat and tolerate moderate perturbation from all directions.  Pt can reach forward to ankles and return to midline without LOB.  FUNCTIONAL OUTCOME MEASURES: FOTO: 20; predicted 29 09/01/22: 19  UPPER EXTREMITY ROM:    Passive ROM Right Eval (A/PROMWNL) Left eval Left 09/01/22  Shoulder flexion  78 90  Shoulder abduction  60 68  Shoulder adduction     Shoulder extension     Shoulder internal rotation     Shoulder external rotation  -10 (elbow at side) -10  Elbow flexion     Elbow extension     Wrist flexion  70 80  Wrist extension  48 58  Wrist ulnar deviation     Wrist radial deviation     Wrist pronation  90 90  Wrist supination  60 82  (Blank rows = not tested)  09/01/22: L  hand passively opens ~75% of full extension d/t PIP and MP tightness in all digits.  UPPER EXTREMITY MMT:     MMT Right eval Left eval Left  09/01/22  Shoulder flexion 5 0 1  Shoulder abduction 5 0 1  Shoulder adduction     Shoulder extension     Shoulder internal rotation     Shoulder external rotation     Middle trapezius     Lower trapezius     Elbow flexion 5 0 1  Elbow extension 5 0 1  Wrist flexion 5 0 1  Wrist extension 5 0 1  Wrist ulnar deviation     Wrist radial deviation     Wrist pronation  0 1  Wrist supination  0 1  (Blank rows = not tested)  HAND FUNCTION: Grip strength: Right: 95 lbs; Left: NT lbs, Lateral pinch: Right: 25 lbs, Left: NT lbs, and 3 point pinch: Right: 20 lbs, Left: NT lbs 09/01/22: L grip/pinch NT (unable)  COORDINATION: 9 Hole Peg test: Right: NT sec; Left: unable sec 09/01/22: L NT (unable)  SENSATION: LUE: Light touch: Impaired  Proprioception: Impaired   EDEMA: None  MUSCLE TONE: LUE: Severe and Hypertonic sublux 1 thumb  breadth  COGNITION: Overall cognitive status:  Decreased awareness of deficits  VISION: Subjective report: Pt reports decreased peripheral vision on the L Baseline vision: No visual deficits Visual history: N/A  VISION ASSESSMENT: Tracking/Visual pursuits: Decreased smoothness of eye movement to Left superior field, Decreased smoothness of eye movement to Left inferior field, and Requires cues, head turns, or add eye shifts to track Saccades: additional eye shifts occurred during testing and additional head turns occurred during testing Visual Fields: Left homonymous hemianopsia  PERCEPTION: Not tested, will assess in functional contexts with additional visits  PRAXIS: Impaired: Motor planning  OBSERVATIONS: L hand rests in flexion on lap tray, improved hand hygiene this date  TODAY'S TREATMENT:             Therapeutic Exercise:    Pt. tolerated PROM throughout the LUE, including all ranges for the L shoulder, elbow, forearm, wrist flex/ext, and digit flex/ext.  Encouraged pt to perform self ROM for the elbow, forearm, wrist, and hand. ROM was performed following moist heat modality.    Manual therapy:   Pt. tolerated soft tissue massage to the left scapular musculature. Pt. tolerated scapular mobilizations for elevation, depression, abduction/rotation. Soft tissue mobilization for the left hand carpal, and metacarpal spread stretches. Manual therapy was performed independent of, and in preparation for ther. Ex. and ROM.     PATIENT EDUCATION: Education details: LUE positioning Person educated: Patient and Spouse Education method: Explanation and Verbal cues, tactile cues, demo Education comprehension: verbalized understanding and needs further education  HOME EXERCISE PROGRAM: PROM throughout the LUE  GOALS: Goals reviewed with patient? Yes  SHORT TERM GOALS: Target date: 10/27/22 (6 weeks)  Pt/caregivers will demo indep with HEP for reducing pain and stiffness throughout  the LUE. Baseline: Not yet initiated; 09/01/22: Pt performs self passive stretching daily throughout the LUE, but caregiver is needed to meet end ranges and min vc for optimal technique.  Spouse reports pt has new caregivers and they are not yet performing ROM, but spouse plans to develop a schedule.  Goal status: ongoing  2.  Pt/caregivers will demo supportive positioning techniques with min vc to support L hemiparetic limb. Baseline: Educ not yet initiated; 09/01/22: Pt has just received his wc lap tray but he does not like  it, per spouse.  Pt is inconsistent to use supportive positioning via other methods that have been recommended, including use of pillows to support L shoulder sublux.  Pt does tuck his L hand into gait belt while working with PT during mobility and spouse reports that pt positions L arm on arm rest of couch at home. Goal status: ongoing  3.  Pt/caregivers will follow L hand splinting schedule with at last 50% compliance in order to reduce contracture risk and to optimize skin integrity of L hand. Baseline: Not yet initiated; 09/01/22: <50% compliance; pt reports inconsistent wearing time and splint was not brought to therapy session today.  OT encouraged pt bring to each session for OT to assess need for splinting adjustments as needed.   Goal status: ongoing   LONG TERM GOALS: Target date: 12/08/22 (12 weeks)  Pt will increase FOTO score to 29 or more to indicate increased indep with daily tasks.   Baseline: Eval: 20 (predicted 29); 09/01/22: 19 Goal status: ongoing  2.  Pt will transfer wc<>BSC with min guard Baseline: min-mod A; 09/01/22 min A consistently, wc to couch or wc to bed with supv Goal status: ongoing  3.  Pt will don/doff shirt with min A Baseline: Mod-max A; 09/01/22: supv to doff sweatshirt, min A to don Goal status: achieved  4.  Pt will manage clothing for toileting with min A Baseline: max A-dep; 08/31/12: mod A  Goal status: ongoing  5.  Pt will increase L  shoulder strength by 2 MM grade to assist with repositioning L upper arm away from side for underarm care and donning shirt. Baseline: L shoulder 0/5; 09/01/22: L shoulder 1/5 (but must use R arm to reposition the L) Goal status: ongoing, (revised from 1 MM grade to 2 MM grades)  6.  Through manual therapy, neuro re-ed, and therapeutic exercises, pt will report 1/10 pain or less throughout the LUE to increase tolerance for engaging the LUE into ADLs.   Baseline: LUE 5/10 pain, 09/01/22: Pt reports 2/10 pain throughout the LUE Goal status: ongoing (revised from 3/10 pain or less to 1/10 pain or less)  7.  Pt will perform supine<>sit bed transfer with supv and AE as needed into his queen size bed. Baseline: Mod A into hospital bed; modified indep with bed rail and trapeze into hospital bed, but pt wants to transition to his queen size bed and has not yet attempted this transfer.  Spouse reports that they may plan to order a bed rail.   Goal status: ongoing  ASSESSMENT:  CLINICAL IMPRESSION:  Pt. was late for the session, however was rescheduled into the next session time. Pt. presents with flexor tightness in the LUE, and hand. Pt. required cues for left sided awareness. Pt./caregiver reports wearing the splint 25% of the time. Pt. caregiver report transfers are improving at home. Pt will continue to benefit from skilled OT to work towards optimal positioning of the LUE for contracture prevention, maximize ROM throughout the LUE for ADLs, decrease pain, maximize engagement of the LUE into daily tasks, as well work to maximize level of indep with daily tasks.   PERFORMANCE DEFICITS: in functional skills including ADLs, IADLs, coordination, dexterity, proprioception, sensation, tone, ROM, strength, pain, flexibility, Fine motor control, Gross motor control, mobility, balance, body mechanics, decreased knowledge of use of DME, skin integrity, vision, and UE functional use, cognitive skills including  perception and safety awareness, and psychosocial skills including coping strategies and routines and behaviors.   IMPAIRMENTS:  are limiting patient from ADLs, IADLs, leisure, and social participation.   CO-MORBIDITIES: may have co-morbidities  that affects occupational performance. Patient will benefit from skilled OT to address above impairments and improve overall function.  MODIFICATION OR ASSISTANCE TO COMPLETE EVALUATION: No modification of tasks or assist necessary to complete an evaluation.  OT OCCUPATIONAL PROFILE AND HISTORY: Problem focused assessment: Including review of records relating to presenting problem.  CLINICAL DECISION MAKING: Moderate - several treatment options, min-mod task modification necessary  REHAB POTENTIAL: Good  EVALUATION COMPLEXITY: High    PLAN:  OT FREQUENCY: 2x/week  OT DURATION: 12 weeks  PLANNED INTERVENTIONS: self care/ADL training, therapeutic exercise, therapeutic activity, neuromuscular re-education, manual therapy, passive range of motion, balance training, functional mobility training, splinting, electrical stimulation, moist heat, cryotherapy, patient/family education, cognitive remediation/compensation, visual/perceptual remediation/compensation, psychosocial skills training, coping strategies training, and DME and/or AE instructions  RECOMMENDED OTHER SERVICES: N/A  CONSULTED AND AGREED WITH PLAN OF CARE: Patient and family member/caregiver  PLAN FOR NEXT SESSION: see plan  Olegario Messier, MS, OTR/L     09/23/2022, 12:22 PM

## 2022-09-24 ENCOUNTER — Other Ambulatory Visit: Payer: Self-pay | Admitting: Internal Medicine

## 2022-09-30 ENCOUNTER — Ambulatory Visit: Payer: Medicaid Other

## 2022-09-30 ENCOUNTER — Other Ambulatory Visit: Payer: Self-pay

## 2022-09-30 MED ORDER — ATORVASTATIN CALCIUM 40 MG PO TABS: 80.0000 mg | ORAL_TABLET | Freq: Every day | ORAL | 1 refills | Status: DC

## 2022-10-01 ENCOUNTER — Other Ambulatory Visit: Payer: Self-pay

## 2022-10-01 ENCOUNTER — Ambulatory Visit: Payer: Medicaid Other | Admitting: Neurology

## 2022-10-01 ENCOUNTER — Encounter: Payer: Self-pay | Admitting: Neurology

## 2022-10-01 VITALS — BP 108/76 | Ht 73.0 in | Wt 225.0 lb

## 2022-10-01 DIAGNOSIS — G40109 Localization-related (focal) (partial) symptomatic epilepsy and epileptic syndromes with simple partial seizures, not intractable, without status epilepticus: Secondary | ICD-10-CM

## 2022-10-01 MED ORDER — BRIVIACT 50 MG PO TABS: 50.0000 mg | ORAL_TABLET | Freq: Two times a day (BID) | ORAL | 5 refills | Status: DC

## 2022-10-01 MED ORDER — BRIVIACT 50 MG PO TABS: 50.0000 mg | ORAL_TABLET | Freq: Every day | ORAL | 0 refills | Status: DC

## 2022-10-01 NOTE — Patient Instructions (Addendum)
Discontinue Levetiracetam  Start Brivaracetam 50 mg twice daily, (sample given to patient) Routine EEG  Continue your current medications  Follow up in 6 months or sooner if worse

## 2022-10-01 NOTE — Progress Notes (Signed)
[facility]  PATIENT: Jacob [name] DOB: [birth year]  REQUESTING CLINICIAN: [name], MD HISTORY FROM: Patient and spouse  REASON FOR VISIT: New onset seizures    HISTORICAL  CHIEF COMPLAINT:  Chief Complaint  Patient presents with   New Patient (Initial Visit)    Rm 12, with wife [place], NP seizures, ED visit 4/24 staring episodes, no other seizures or episodes per wife    HISTORY OF PRESENT ILLNESS:  This is a 47 year old gentleman past medical history hypertension, diabetes, recent large right MCA stroke in April 2023 who is presenting after a seizure.  Patient was in his usual state of health, he reports sitting out on the bed and he felt like he was tired and fell on the side.  He felt like he heard the wife talking to him but could not respond initially.  Wife reported patient was sitting on the bed then became unresponsive and fell, hopefully he fell on the bed and did not hurt himself.  He had a stare his face and was unresponsive for about a minute then he started responding by shaking his head yes and no.  EMS was called and patient was taken to the ED.  In the ED, he was back to his normal baseline.  His head CT did not show any acute intracranial abnormality, only the old right MCA stroke.  Plan at that time was to start him on Keppra.  He is currently on Keppra 500 mg twice daily, denies any seizure but reports side effect of irritability, anger and somnolence.  He never had a previous history of seizures, since his stroke, this is the first seizure-like event.  Handedness: Right handed   Onset:09/16/22  Seizure Type: Staring spells, unresponsive then fall  Current frequency: Only once   Any injuries from seizures: Denies   Seizure risk factors: Large R MCA stroke   Previous ASMs: None   Currenty ASMs: Levetiracetam 500 mg twice daily   ASMs side effects: Agitation, Irritable   Brain Images: Chronic large R MCA stroke   Previous  EEGs: Not previously done    OTHER MEDICAL CONDITIONS: R MCA stroke, Diabetes, Hypertension   REVIEW OF SYSTEMS: Full 14 system review of systems performed and negative with exception of: As noted in the HPI   ALLERGIES: Allergies  Allergen Reactions   Hydrochlorothiazide Other (See Comments)    Bilateral muscle cramping    HOME MEDICATIONS: Outpatient Medications Prior to Visit  Medication Sig Dispense Refill   amLODipine (NORVASC) 10 MG tablet Take 1 tablet (10 mg total) by mouth daily. 90 tablet 1   atorvastatin (LIPITOR) 40 MG tablet Take 2 tablets (80 mg total) by mouth at bedtime. (Patient taking differently: Take 40 mg by mouth at bedtime.) 90 tablet 1   baclofen (LIORESAL) 10 MG tablet Take 1 tablet (10 mg total) by mouth 3 (three) times daily. 90 each 3   blood glucose meter kit and supplies KIT Dispense based on patient and insurance preference. Use up to four times daily as directed. 1 each 0   cloNIDine (CATAPRES - DOSED IN MG/24 HR) 0.3 mg/24hr patch Place 1 patch (0.3 mg total) onto the skin once a week. 4 patch 6   ELIQUIS 5 MG TABS tablet TAKE 1 TABLET BY MOUTH TWICE A DAY 180 tablet 1   escitalopram (LEXAPRO) 20 MG tablet TAKE 1 AND 1/2 TABLETS DAILY BY MOUTH 45 tablet 2   glucose blood (RELION TRUE METRIX TEST STRIPS) test  strip Use as instructed 100 each 12   insulin glargine (LANTUS) 100 UNIT/ML injection Inject 0.1 mLs (10 Units total) into the skin daily. (Patient taking differently: Inject 12 Units into the skin daily.) 10 mL 0   insulin lispro (HUMALOG) 100 UNIT/ML KwikPen Inject 0-20 Units into the skin 3 (three) times daily. Inject 0-20 Units into the skin 3 (three) times daily with meals. CBG < 70 call MD. CBG 70-120 give 0 units; 121-150 give 3 units; 151-200 give 4 units, 201-250 give 7 units; 251-300 give 11 units; 301-350 give 15 units; 351-400 give 20 units; greater than 400 call MD 15 mL 1   Lancets Ultra Fine MISC Use as directed 100 each 11   lisinopril  (ZESTRIL) 40 MG tablet Take 1 tablet (40 mg total) by mouth daily. 90 tablet 1   metFORMIN (GLUCOPHAGE) 500 MG tablet Take 1 tablet (500 mg total) by mouth 2 (two) times daily with a meal. 180 tablet 1   metoprolol tartrate (LOPRESSOR) 25 MG tablet Take 1 tablet (25 mg total) by mouth 2 (two) times daily. 60 tablet 11   sildenafil (VIAGRA) 50 MG tablet Take 1 tablet (50 mg total) by mouth daily as needed for erectile dysfunction. 10 tablet 2   traZODone (DESYREL) 50 MG tablet Take 1 tablet (50 mg total) by mouth at bedtime. 90 tablet 1   levETIRAcetam (KEPPRA) 500 MG tablet Take 1 tablet (500 mg total) by mouth 2 (two) times daily. 60 tablet 1   No facility-administered medications prior to visit.    PAST MEDICAL HISTORY: Past Medical History:  Diagnosis Date   Allergy    Diabetes mellitus without complication (HCC)    GERD (gastroesophageal reflux disease)    Hypertension    Paralysis (HCC)    Seizures (HCC)    Stroke [facility])     PAST SURGICAL HISTORY: Past Surgical History:  Procedure Laterality Date   APPENDECTOMY     BUBBLE STUDY  10/02/2021   Procedure: BUBBLE STUDY;  Surgeon: Pricilla Riffle, MD;  Location: [facility] ENDOSCOPY;  Service: Cardiovascular;;   CRANIOPLASTY Right 02/09/2022   Procedure: Craniectomy with Replacement of Bone Flap From the Abdomen;  Surgeon: Tressie Stalker, MD;  Location: [facility] OR;  Service: Neurosurgery;  Laterality: Right;   CRANIOTOMY Right 09/15/2021   Procedure: RIGHT DECOMPRESSIVE CRANIOTOMY, PLACEMENT OF BONE FLAP IN ABDOMEN;  Surgeon: Tressie Stalker, MD;  Location: [facility] OR;  Service: Neurosurgery;  Laterality: Right;   IR CT HEAD LTD  09/12/2021   IR PERCUTANEOUS ART THROMBECTOMY/INFUSION INTRACRANIAL INC DIAG ANGIO  09/12/2021   IR US GUIDE VASC ACCESS RIGHT  09/12/2021   LAPAROSCOPIC APPENDECTOMY  10/24/2013   Procedure: APPENDECTOMY LAPAROSCOPIC;  Surgeon: Kandis Cocking, MD;  Location: [facility] ORS;  Service: General;;   RADIOLOGY WITH ANESTHESIA N/A 09/12/2021    Procedure: IR WITH ANESTHESIA;  Surgeon: Julieanne Cotton, MD;  Location: [facility] OR;  Service: Radiology;  Laterality: N/A;   TEE WITHOUT CARDIOVERSION N/A 10/02/2021   Procedure: TRANSESOPHAGEAL ECHOCARDIOGRAM (TEE);  Surgeon: Pricilla Riffle, MD;  Location: [facility] ENDOSCOPY;  Service: Cardiovascular;  Laterality: N/A;    FAMILY HISTORY: Family History  Problem Relation Age of Onset   Asthma Son    Stroke Neg Hx     SOCIAL HISTORY: Social History   Socioeconomic History   Marital status: Married    Spouse name: Not on file   Number of children: Not on file   Years of education: Not on file   Highest education  level: Not on file  Occupational History   Not on file  Tobacco Use   Smoking status: Never   Smokeless tobacco: Never  Vaping Use   Vaping Use: Never used  Substance and Sexual Activity   Alcohol use: Yes    Alcohol/week: 1.0 standard drink of alcohol    Types: 1 Cans of beer per week    Comment: OCCASIONALLY   Drug use: No   Sexual activity: Not on file  Other Topics Concern   Not on file  Social History Narrative   Lives at home with wife and son 52yr old   Right handed   Caffeine-8oz   Social Determinants of Health   Financial Resource Strain: Low Risk  (02/05/2022)   Overall Financial Resource Strain (CARDIA)    Difficulty of Paying Living Expenses: Not very hard  Food Insecurity: No Food Insecurity (03/18/2022)   Hunger Vital Sign    Worried About Running Out of Food in the Last Year: Never true    Ran Out of Food in the Last Year: Never true  Transportation Needs: No Transportation Needs (03/18/2022)   PRAPARE - Administrator, Civil Service (Medical): No    Lack of Transportation (Non-Medical): No  Physical Activity: Not on file  Stress: Not on file  Social Connections: Not on file  Intimate Partner Violence: Not on file    PHYSICAL EXAM  GENERAL EXAM/CONSTITUTIONAL: Vitals:  Vitals:   10/01/22 1100  BP: 108/76  Weight: 225 lb  (102.1 kg)  Height: 6\' 1"  (1.854 m)   Body mass index is 29.69 kg/m. Wt Readings from Last 3 Encounters:  10/01/22 225 lb (102.1 kg)  09/22/22 212 lb (96.2 kg)  09/16/22 212 lb (96.2 kg)   Patient is in no distress; well developed, nourished and groomed; neck is supple  MUSCULOSKELETAL: Gait, strength, tone, movements noted in Neurologic exam below  NEUROLOGIC: MENTAL STATUS:      No data to display         awake, alert, oriented to person, place and time recent and remote memory intact normal attention and concentration language fluent, comprehension intact, naming intact fund of knowledge appropriate  CRANIAL NERVE:  2nd, 3rd, 4th, 6th - He has a left visual field cut, extraocular muscles intact, no nystagmus 5th - facial sensation symmetric 7th - facial strength symmetric 8th - hearing intact 9th - palate elevates symmetrically, uvula midline 11th - shoulder shrug symmetric 12th - tongue protrusion midline  MOTOR:  normal bulk and tone, full strength in the RUE, RLE LUE shoulder 3/5, elbow flexion/extension 2/5, wrist flexion/extension 1-2/5 LLE: Hip flexion 4/5, knee flexion/extension 3-4/5  SENSORY:  Extinction to simultaneous tactile stimulus in the LUE  COORDINATION:  finger-nose-finger on the right.  GAIT/STATION:  Deferred, on a wheelchair    DIAGNOSTIC DATA (LABS, IMAGING, TESTING) - I reviewed patient records, labs, notes, testing and imaging myself where available.  Lab Results  Component Value Date   WBC 5.5 09/22/2022   HGB 14.0 09/22/2022   HCT 42.0 09/22/2022   MCV 85 09/22/2022   PLT 202 09/22/2022      Component Value Date/Time   NA 143 09/22/2022 1113   K 3.7 09/22/2022 1113   CL 101 09/22/2022 1113   CO2 27 09/22/2022 1113   GLUCOSE 149 (H) 09/22/2022 1113   GLUCOSE 256 (H) 09/16/2022 1121   BUN 11 09/22/2022 1113   CREATININE 0.81 09/22/2022 1113   CREATININE 1.05 11/19/2011 0858   CALCIUM 9.7  09/22/2022 1113   PROT 6.9  09/22/2022 1113   ALBUMIN 4.3 09/22/2022 1113   AST 22 09/22/2022 1113   ALT 14 09/22/2022 1113   ALKPHOS 153 (H) 09/22/2022 1113   BILITOT 1.1 09/22/2022 1113   GFRNONAA >60 09/16/2022 1115   GFRAA 102 06/28/2018 1240   Lab Results  Component Value Date   CHOL 135 09/22/2022   HDL 43 09/22/2022   LDLCALC 70 09/22/2022   TRIG 120 09/22/2022   Lab Results  Component Value Date   HGBA1C 7.0 (H) 09/22/2022   No results found for: "VITAMINB12" Lab Results  Component Value Date   TSH 0.781 09/22/2022   Head CT 09/16/2022 No acute CT finding. Old right MCA territory infarction with atrophy and encephalomalacia. Previous craniectomy with replacement of the bone flap. Dural thickening along the bone flap.  EEG 09/21/2021 This EEG was obtained while intubated and sedated on fentanyl and is abnormal due to: - Mild diffuse slowing indicative of global cerebral dysfunction - Right focal slowing indicative of superimposed focal cerebral dysfunction in that region - Epileptiform abnormalities were not seen during this recording  I personally reviewed brain Images and previous EEG reports.   ASSESSMENT AND PLAN  47 y.o. year old male  with history of right MCA stroke, hypertension, diabetes who is presenting after an event concerning for seizure.  Based on description patient likely had a focal onset seizure.  He was started on levetiracetam 500 mg twice daily, no seizures since then but there was side effects of anger and irritability.  Due to side effects, I will switch his levetiracetam to brivaracetam 50 mg twice daily.  I have provided samples to the patient.  We will also obtain routine EEG.  I will see him in 6 months for follow-up, he understands to contact me sooner if he has a breakthrough seizure or any other question or concerns.   1. Partial symptomatic epilepsy with simple partial seizures, not intractable, without status epilepticus (HCC)     Patient Instructions   Discontinue Levetiracetam  Start Brivaracetam 50 mg twice daily, (sample given to patient) Routine EEG  Continue your current medications  Follow up in 6 months or sooner if worse    Per [facility] statutes, patients with seizures are not allowed to drive until they have been seizure-free for six months.  Other recommendations include using caution when using heavy equipment or power tools. Avoid working on ladders or at heights. Take showers instead of baths.  Do not swim alone.  Ensure the water temperature is not too high on the home water heater. Do not go swimming alone. Do not lock yourself in a room alone (i.e. bathroom). When caring for infants or small children, sit down when holding, feeding, or changing them to minimize risk of injury to the child in the event you have a seizure. Maintain good sleep hygiene. Avoid alcohol.  Also recommend adequate sleep, hydration, good diet and minimize stress.   During the Seizure  - First, ensure adequate ventilation and place patients on the floor on their left side  Loosen clothing around the neck and ensure the airway is patent. If the patient is clenching the teeth, do not force the mouth open with any object as this can cause severe damage - Remove all items from the surrounding that can be hazardous. The patient may be oblivious to what's happening and may not even know what he or she is doing. If the patient is confused and  wandering, either gently guide him/her away and block access to outside areas - Reassure the individual and be comforting - Call 911. In most cases, the seizure ends before EMS arrives. However, there are cases when seizures may last over 3 to 5 minutes. Or the individual may have developed breathing difficulties or severe injuries. If a pregnant patient or a person with diabetes develops a seizure, it is prudent to call an ambulance. - Finally, if the patient does not regain full consciousness, then call EMS. Most  patients will remain confused for about 45 to 90 minutes after a seizure, so you must use judgment in calling for help. - Avoid restraints but make sure the patient is in a bed with padded side rails - Place the individual in a lateral position with the neck slightly flexed; this will help the saliva drain from the mouth and prevent the tongue from falling backward - Remove all nearby furniture and other hazards from the area - Provide verbal assurance as the individual is regaining consciousness - Provide the patient with privacy if possible - Call for help and start treatment as ordered by the caregiver   After the Seizure (Postictal Stage)  After a seizure, most patients experience confusion, fatigue, muscle pain and/or a headache. Thus, one should permit the individual to sleep. For the next few days, reassurance is essential. Being calm and helping reorient the person is also of importance.  Most seizures are painless and end spontaneously. Seizures are not harmful to others but can lead to complications such as stress on the lungs, brain and the heart. Individuals with prior lung problems may develop labored breathing and respiratory distress.     Orders Placed This Encounter  Procedures   EEG adult    Meds ordered this encounter  Medications   Brivaracetam (BRIVIACT) 50 MG TABS    Sig: Take 50 mg by mouth 2 (two) times daily.    Dispense:  60 tablet    Refill:  5    Return in about 6 months (around 04/03/2023).    Windell Norfolk, MD 10/01/2022, 5:07 PM  [facility] [address], [address] [place], [place] [address] [phone]

## 2022-10-07 ENCOUNTER — Ambulatory Visit: Payer: Medicaid Other

## 2022-10-08 ENCOUNTER — Ambulatory Visit: Payer: Medicaid Other | Admitting: Podiatry

## 2022-10-08 ENCOUNTER — Ambulatory Visit: Payer: Medicaid Other | Admitting: Physical Therapy

## 2022-10-08 ENCOUNTER — Ambulatory Visit: Payer: Medicaid Other

## 2022-10-08 ENCOUNTER — Encounter: Payer: Self-pay | Admitting: Physical Therapy

## 2022-10-08 DIAGNOSIS — M6281 Muscle weakness (generalized): Secondary | ICD-10-CM

## 2022-10-08 DIAGNOSIS — R2681 Unsteadiness on feet: Secondary | ICD-10-CM

## 2022-10-08 DIAGNOSIS — R269 Unspecified abnormalities of gait and mobility: Secondary | ICD-10-CM

## 2022-10-08 DIAGNOSIS — R278 Other lack of coordination: Secondary | ICD-10-CM

## 2022-10-08 DIAGNOSIS — R262 Difficulty in walking, not elsewhere classified: Secondary | ICD-10-CM

## 2022-10-08 NOTE — Therapy (Signed)
OUTPATIENT PHYSICAL THERAPY NEURO TREATMENT/Re-certification   Patient Name: Jacob [name] MRN: [id number] DOB:[birth year], 47 y.o., male Today's Date: 10/08/2022   PCP: Dr. [name] REFERRING PROVIDER: Dr. Michel Santee Eyk  END OF SESSION:  PT End of Session - 10/08/22 0911     Visit Number 14    Number of Visits 36    Date for PT Re-Evaluation 11/04/22    Authorization Type [place] [employer]    Progress Note Due on Visit 20    PT Start Time 0901    PT Stop Time 0930    PT Time Calculation (min) 29 min    Equipment Utilized During Treatment Gait belt    Activity Tolerance Patient tolerated treatment well    Behavior During Therapy WFL for tasks assessed/performed                    Past Medical History:  Diagnosis Date   Allergy    Diabetes mellitus without complication (HCC)    GERD (gastroesophageal reflux disease)    Hypertension    Paralysis (HCC)    Seizures (HCC)    Stroke [facility])    Past Surgical History:  Procedure Laterality Date   APPENDECTOMY     BUBBLE STUDY  10/02/2021   Procedure: BUBBLE STUDY;  Surgeon: Pricilla Riffle, MD;  Location: [facility] ENDOSCOPY;  Service: Cardiovascular;;   CRANIOPLASTY Right 02/09/2022   Procedure: Craniectomy with Replacement of Bone Flap From the Abdomen;  Surgeon: Tressie Stalker, MD;  Location: [facility] OR;  Service: Neurosurgery;  Laterality: Right;   CRANIOTOMY Right 09/15/2021   Procedure: RIGHT DECOMPRESSIVE CRANIOTOMY, PLACEMENT OF BONE FLAP IN ABDOMEN;  Surgeon: Tressie Stalker, MD;  Location: [facility] OR;  Service: Neurosurgery;  Laterality: Right;   IR CT HEAD LTD  09/12/2021   IR PERCUTANEOUS ART THROMBECTOMY/INFUSION INTRACRANIAL INC DIAG ANGIO  09/12/2021   IR US GUIDE VASC ACCESS RIGHT  09/12/2021   LAPAROSCOPIC APPENDECTOMY  10/24/2013   Procedure: APPENDECTOMY LAPAROSCOPIC;  Surgeon: Kandis Cocking, MD;  Location: [facility] ORS;  Service: General;;   RADIOLOGY WITH ANESTHESIA N/A 09/12/2021   Procedure: IR WITH ANESTHESIA;  Surgeon:  Julieanne Cotton, MD;  Location: [facility] OR;  Service: Radiology;  Laterality: N/A;   TEE WITHOUT CARDIOVERSION N/A 10/02/2021   Procedure: TRANSESOPHAGEAL ECHOCARDIOGRAM (TEE);  Surgeon: Pricilla Riffle, MD;  Location: [facility] ENDOSCOPY;  Service: Cardiovascular;  Laterality: N/A;   Patient Active Problem List   Diagnosis Date Noted   Annual physical exam 09/22/2022   Cerebrovascular disease 09/22/2022   History of DVT (deep vein thrombosis) 09/22/2022   Spastic hemiplegia of left nondominant side as late effect of cerebral infarction (HCC) 09/22/2022   Seizure disorder (HCC) 09/22/2022   BMI 27.0-27.9,adult 09/22/2022   Mild episode of recurrent major depressive disorder (HCC) 06/15/2022   Status post craniectomy 02/09/2022   Urinary retention 09/18/2021   Vitamin D deficiency 11/04/2020   Hyperlipidemia 03/28/2018   Diabetes mellitus without complication (HCC) 02/23/2018   Essential hypertension 02/23/2018    ONSET DATE: 08/2021  REFERRING DIAG:  Diagnosis  I63.9 (ICD-10-CM) - Cerebral infarction, unspecified  R53.81 (ICD-10-CM) - Other malaise    THERAPY DIAG:  Difficulty in walking, not elsewhere classified  Muscle weakness (generalized)  Abnormality of gait and mobility  Other lack of coordination  Unsteadiness on feet  Rationale for Evaluation and Treatment: Rehabilitation  SUBJECTIVE:  SUBJECTIVE STATEMENT: Patient reports that he is doing well. No pain reported. Pt late to PT treatment on this day, pt unsure of reason. States that he did not sleep well, and is still very tired this morning. Throughout session pt with questions about returning to driving in the future.   Pt accompanied by: significant other- Wife  PERTINENT HISTORY: Patient is a 47 year old male with recent history of  CVA  in April 2023 and then elective cranioplasty in September 2023 with subsequent inpatient rehab and then home health services. Patient has left hemiparesis.   PAIN:  Are you having pain? No  PRECAUTIONS: Fall  WEIGHT BEARING RESTRICTIONS: No  FALLS: Has patient fallen in last 6 months? Yes. 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   RADIATION DOSE REDUCTION: This exam was performed according to the departmental dose-optimization program which includes automated exposure control, adjustment of the mA and/or kV according to patient size and/or use of iterative reconstruction technique.   COMPARISON:  09/19/2021   FINDINGS: Brain: Diffuse cytotoxic edema is noted throughout the distribution of the right MCA and PCA watershed region similar to that seen on the prior exam. No parenchymal hemorrhage is seen. No new focal infarct is seen.   Vascular: No hyperdense vessel or unexpected calcification.   Skull: Large calvarial defect there is noted on the right stable from the prior study.   Sinuses/Orbits: Paranasal sinuses are within normal limits.    Other: Gastric catheter and feeding catheter are noted extending through the nose.   IMPRESSION: Right MCA/PCA infarct stable in appearance from the previous exam. No acute hemorrhage is noted.   Right-sided hemi craniectomy stable from the prior exam. No new focal abnormality is noted.     Electronically Signed   By: Alcide Clever M.D.   On: 09/24/2021 02:47  COGNITION: Overall cognitive status: Impaired-decreased awareness of functional limitations   SENSATION: Light touch: Impaired - decreased  COORDINATION:  Left side hemiparesis  EDEMA:  None observed  MUSCLE TONE: LLE: Hypotonic   POSTURE: rounded shoulders and forward head   LOWER EXTREMITY MMT:    MMT Right Eval Left Eval  Hip flexion 5 2-  Hip extension 5 2-  Hip abduction 5 2-  Hip adduction 5 2-  Hip internal rotation 5 2-  Hip external rotation 5 2-  Knee flexion 5 2-  Knee extension 5 2-  Ankle dorsiflexion 5  0   Ankle plantarflexion    Ankle inversion    Ankle eversion    (Blank rows = not tested)  BED MOBILITY:  Not tested  TRANSFERS: Assistive device utilized: Hemi walker  Sit to stand: Mod A Stand to sit:  Mod A Chair to chair: Mod A Floor:  Not tested   STAIRS: *Patient adamantly wanted to attempt today Level of Assistance: Max A Stair Negotiation Technique: Step to Pattern with Bilateral Rails Number of Stairs: 1  Height of Stairs: 6"  Comments: Patient wanted to attempt steps continuously requesting to try- He was able to stand from w/c with mod assist using right arm for support on armrest of chair with gait belt. Once in standing he was able to step up with right LE with physical assist to block his left LE yet able to flex left hip/knee enough to clear 1st step- positioned back in chair.   GAIT: Gait pattern: step to pattern Distance walked: 8 feet Assistive device utilized: Hemi walker Level of assistance: Mod A Comments: Patient required VC for seqencing and use of  left AFO today. - fatigues very quickly and left knee buckling with weight bearing.   FUNCTIONAL TESTS:  5 times sit to stand: Patient required mod Assist to stand and unable to complete on his own.  PATIENT SURVEYS:  FOTO 36; predicted goal= 47  TODAY'S TREATMENT:                                                                                                                              DATE: 10/08/22   Pt performed pregait stepping forward/reverse R and L.   Gait with HW x 66ft with min assist progressing to CGA with moderate cues for hip and trunk extension when standing on LLE.   Stepping on cane with LLE and over with RLEx 6 bil.   Seated AAROM LAQ x 8 with max cues for activation of knee extension over hip flexion    Throughout session, pt required UE support on HW for all standing activities, as well as min-CGA for safety to facilitate hip and trunk extension and cues for activation of knee extension in standing on LLE.   PATIENT EDUCATION: Education details: PT plan of care and HEP Person educated: Patient and Spouse Education method: Explanation, Demonstration, Tactile cues, Verbal cues, and Handouts Education comprehension: verbalized understanding, returned demonstration, verbal cues required, tactile cues required, and needs further education  HOME EXERCISE PROGRAM: Access Code: [email or web address] URL: [email or web address] Date: 06/23/2022 Prepared by: Maureen Ralphs  Exercises - Supine Quad Set  - 1 x daily - 7 x weekly - 3 sets - 10 reps - Seated Long Arc Quad  - 1 x daily - 7 x weekly - 3 sets - 10 reps - Seated March  - 1 x daily - 7 x weekly - 3 sets - 10 reps - Supine Bridge  - 1 x daily - 7 x weekly - 3 sets - 10 reps - Sit to Stand with Counter Support  - 1 x daily - 7 x weekly - 3 sets - 10 reps  GOALS: Goals reviewed with patient? Yes  SHORT TERM GOALS: Target date: 08/04/2022  Pt will be independent with  HEP in order to improve strength and  balance in order to decrease fall risk and improve function at home and work.  Baseline: EVAL-Patient with no formal HEP in place Goal status: INITIAL  2.  Patient will perform all stand pivot transfers with min Assist using hemi-walker for improved functional independence with mobility in home.  Baseline: EVAL= Mod A with transfers Goal status: INITIAL    LONG TERM GOALS: Target date: 09/15/2022  Pt will improve FOTO to target score of 47  to display perceived improvements in ability to complete ADL's.  Baseline: EVAL= 36 Goal status: INITIAL  2.  Pt will achieve 5x STS in less than 60 seconds in order to demonstrate clinically significant improvement in LE strength. Baseline: EVAL- Mod A with sit to stand transfer Goal status: MET INITIAL 3.  Patient will ambulate > 50 feet with hemiwalker using step to gait and CGA for improved household mobility.  Baseline: EVAL- 8 feet with hemiwalker and mod A Goal status: IN PROGRESS  4.  Patient will perform all transfers with CGA  including w/c to various surfaces- toilet, bed, chairs using hemiwalker for improved functional independence with mobility.  Baseline:  EVAL- Mod asst with all transfers Goal status: IN PROGRESS   ASSESSMENT:  CLINICAL IMPRESSION:  Patient presents with good motivation for completion of physical therapy activity, but fatigue limiting on this day. Pt able to demonstrate increased activation of L hip and knee extensors with increased repetitions throughout session, but constant verbal and tactile cues for attention to the R. Pt continues to demonstrate poor awareness of deficits with continued questions about returning to driving. PT attempted to educate pt on challenges to driving with his profound vision loss, but poor evidence of understanding from pt. Pt will continue to benefit from skilled physical therapy intervention to address impairments, improve QOL, and attain therapy goals.       OBJECTIVE  IMPAIRMENTS: Abnormal gait, cardiopulmonary status limiting activity, decreased activity tolerance, decreased balance, decreased cognition, decreased coordination, decreased endurance, decreased mobility, difficulty walking, decreased strength, decreased safety awareness, impaired perceived functional ability, impaired sensation, and impaired UE functional use.   ACTIVITY LIMITATIONS: carrying, lifting, bending, standing, squatting, stairs, transfers, and bed mobility  PARTICIPATION LIMITATIONS: meal prep, cleaning, laundry, driving, shopping, community activity, occupation, and yard work  PERSONAL FACTORS: Time since onset of injury/illness/exacerbation and 1-2 comorbidities: HTN, DM  are also affecting patient's functional outcome.   REHAB POTENTIAL: Good  CLINICAL DECISION MAKING: Evolving/moderate complexity  EVALUATION COMPLEXITY: Moderate  PLAN:  PT FREQUENCY: 1-2x/week  PT DURATION: 12 weeks  PLANNED INTERVENTIONS: Therapeutic exercises, Therapeutic activity, Neuromuscular re-education, Balance training, Gait training, Patient/Family education, Self Care, Joint mobilization, Stair training, Orthotic/Fit training, DME instructions, Dry Needling, Electrical stimulation, Wheelchair mobility training, Spinal mobilization, Cryotherapy, Moist heat, Splintting, Taping, and Manual therapy  PLAN FOR NEXT SESSION:   Review and progress LE strengthening. Transfer training for safe mobility, Gait training in // bars or with hemiwalker  Add to HEP as appropriate.   Grier Rocher PT, DPT  Physical Therapist - [place]  [facility]  9:47 AM 10/08/22

## 2022-10-11 NOTE — Therapy (Signed)
OUTPATIENT OCCUPATIONAL THERAPY NEURO TREATMENT NOTE  Patient Name: Jacob [name] MRN: [id number] DOB:Mar 02, 1976, 47 y.o., male Today's Date: 10/11/2022  PCP: Dr. [name] REFERRING PROVIDER: Dr. Michel Santee Eyk  END OF SESSION:  OT End of Session - 10/08/22       Visit Number 14     Number of Visits 24     Date for OT Re-Evaluation 12/08/22     Authorization Type **Approved for additional visits as of 09/23/2022. D/C at the 4th approved visit*     Authorization Time Period *10/08/2022: 2 of 4 completed*     OT Start Time 0930     OT Stop Time 1015     OT Time Calculation (min) 45 min     Activity Tolerance Patient tolerated treatment fair     Behavior During Therapy WFL for tasks assessed/performed          Past Medical History:  Diagnosis Date   Allergy    Diabetes mellitus without complication (HCC)    GERD (gastroesophageal reflux disease)    Hypertension    Paralysis (HCC)    Seizures (HCC)    Stroke [facility])    Past Surgical History:  Procedure Laterality Date   APPENDECTOMY     BUBBLE STUDY  10/02/2021   Procedure: BUBBLE STUDY;  Surgeon: Pricilla Riffle, MD;  Location: [facility] ENDOSCOPY;  Service: Cardiovascular;;   CRANIOPLASTY Right 02/09/2022   Procedure: Craniectomy with Replacement of Bone Flap From the Abdomen;  Surgeon: Tressie Stalker, MD;  Location: [facility] OR;  Service: Neurosurgery;  Laterality: Right;   CRANIOTOMY Right 09/15/2021   Procedure: RIGHT DECOMPRESSIVE CRANIOTOMY, PLACEMENT OF BONE FLAP IN ABDOMEN;  Surgeon: Tressie Stalker, MD;  Location: [facility] OR;  Service: Neurosurgery;  Laterality: Right;   IR CT HEAD LTD  09/12/2021   IR PERCUTANEOUS ART THROMBECTOMY/INFUSION INTRACRANIAL INC DIAG ANGIO  09/12/2021   IR US GUIDE VASC ACCESS RIGHT  09/12/2021   LAPAROSCOPIC APPENDECTOMY  10/24/2013   Procedure: APPENDECTOMY LAPAROSCOPIC;  Surgeon: Kandis Cocking, MD;  Location: [facility] ORS;  Service: General;;   RADIOLOGY WITH ANESTHESIA N/A 09/12/2021   Procedure: IR WITH  ANESTHESIA;  Surgeon: Julieanne Cotton, MD;  Location: [facility] OR;  Service: Radiology;  Laterality: N/A;   TEE WITHOUT CARDIOVERSION N/A 10/02/2021   Procedure: TRANSESOPHAGEAL ECHOCARDIOGRAM (TEE);  Surgeon: Pricilla Riffle, MD;  Location: [facility] ENDOSCOPY;  Service: Cardiovascular;  Laterality: N/A;   Patient Active Problem List   Diagnosis Date Noted   Annual physical exam 09/22/2022   Cerebrovascular disease 09/22/2022   History of DVT (deep vein thrombosis) 09/22/2022   Spastic hemiplegia of left nondominant side as late effect of cerebral infarction (HCC) 09/22/2022   Seizure disorder (HCC) 09/22/2022   BMI 27.0-27.9,adult 09/22/2022   Mild episode of recurrent major depressive disorder (HCC) 06/15/2022   Status post craniectomy 02/09/2022   Urinary retention 09/18/2021   Vitamin D deficiency 11/04/2020   Hyperlipidemia 03/28/2018   Diabetes mellitus without complication (HCC) 02/23/2018   Essential hypertension 02/23/2018   ONSET DATE: April 2023  REFERRING DIAG: I63.9 (ICD-10-CM) - Cerebral infarction, unspecified   THERAPY DIAG:   Muscle weakness (generalized)  Other lack of coordination  Rationale for Evaluation and Treatment: Rehabilitation  SUBJECTIVE: Pt arrived from PT session to OT session with head down, hand over face.  Pt reported, "I'm tired of living like this."  OT assessed for suicidal ideation; pt reports no desire for self harm or harming others, and acknowledged those actions are  not inline with his faith.  OT did review discussion with spouse, and spouse acknowledged awareness of pt's feelings and that pt is taking medication for depression.  SUBJECTIVE STATEMENT: Pt accompanied by: self and significant other  PERTINENT HISTORY: Per chart, Jacob [name] is a 47 y.o. male with a history of prior CVA with decompressive craniectomy in April 2023.  He has a history of right peroneal DVT on Eliquis.  He has left hemiparesis from stroke.   Per spouse, pt spent some  time in [facility], then 2 months at [facility] (SNF). He presented for elective cranioplasty in Sept 2023.  Pt was admitted to rehab 02/14/2022 for inpatient therapies following cranioplasty, and participated in [facility] therapies until recently.      PRECAUTIONS: Fall  WEIGHT BEARING RESTRICTIONS: No  PAIN: 10/09/22: Pt reports 3/10 pain   Are you having pain? Yes: NPRS scale: 0 (rest), when stretching 2/10 Pain location: L hand, L shoulder sublux Pain description: achy Aggravating factors: stretching of the L hand, movement of the L shoulder  Relieving factors: lying down and gentle stretching   FALLS: Has patient fallen in last 6 months? Yes. Number of falls 1  LIVING ENVIRONMENT: Lives with: lives with their spouse and 84 y/o son, 1 cousin, and 1 friend Lives in: 2 level with master bedroom on the main level, pt resides only on 1st level Stairs: 1 step no rail to enter, pt does have small ramp Has following equipment at home: Hemi walker, Wheelchair (manual), Shower bench, bed side commode, Grab bars, Ramped entry, and platform walker (grab bar by the toilet but not in the shower.  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  PATIENT GOALS: Pt wants to get back to walking and being as indep as possible.   OBJECTIVE:   HAND DOMINANCE: Right  ADLs: Overall ADLs: HHA Mon-Fri 5 hours/day; spouse is pt's primary caregiver Transfers/ambulation related to ADLs: min A sit to stand pivot from wc using gait belt  Eating: assist to cut food  Grooming: min A to manage facial hair UB Dressing: max A LB Dressing: max-dep A Toileting: dep, wears adult briefs.  Spouse reports pt has just started transitioning to use of BSC over the toilet vs voiding in a brief Bathing: max A with sponge/bed bath (in the process of getting bathroom re-done to enable pt to take a shower) Tub Shower transfers: N/A at this time; pt states he  doesn't trust himself with the glass doors. Equipment:  see above  IADLs: Shopping: dep Light housekeeping: dep Meal Prep: dep Community mobility: wc dep  Medication management: dep Financial management: dep Handwriting:  Pt is R hand dominant, NT  MOBILITY STATUS:  Pt reports he can amb with hemi-walker 10-20 ft with max A (per spouse) and someone following with a wc behind him  POSTURE COMMENTS:  rounded shoulders, forward head, and L sided hemiplegia (L shoulder subluxation ~1 thumb breadth) Sitting balance:  able to sit edge of mat and tolerate moderate perturbation from all directions.  Pt can reach forward to ankles and return to midline without LOB.  FUNCTIONAL OUTCOME MEASURES: FOTO: 20; predicted 29 09/01/22: 19  UPPER EXTREMITY ROM:    Passive ROM Right Eval (A/PROMWNL) Left eval Left 09/01/22  Shoulder flexion  78 90  Shoulder abduction  60 68  Shoulder adduction     Shoulder extension     Shoulder internal rotation  Shoulder external rotation  -10 (elbow at side) -10  Elbow flexion     Elbow extension     Wrist flexion  70 80  Wrist extension  48 58  Wrist ulnar deviation     Wrist radial deviation     Wrist pronation  90 90  Wrist supination  60 82  (Blank rows = not tested)  09/01/22: L hand passively opens ~75% of full extension d/t PIP and MP tightness in all digits.  UPPER EXTREMITY MMT:     MMT Right eval Left eval Left  09/01/22  Shoulder flexion 5 0 1  Shoulder abduction 5 0 1  Shoulder adduction     Shoulder extension     Shoulder internal rotation     Shoulder external rotation     Middle trapezius     Lower trapezius     Elbow flexion 5 0 1  Elbow extension 5 0 1  Wrist flexion 5 0 1  Wrist extension 5 0 1  Wrist ulnar deviation     Wrist radial deviation     Wrist pronation  0 1  Wrist supination  0 1  (Blank rows = not tested)  HAND FUNCTION: Grip strength: Right: 95 lbs; Left: NT lbs, Lateral pinch: Right: 25 lbs, Left: NT  lbs, and 3 point pinch: Right: 20 lbs, Left: NT lbs 09/01/22: L grip/pinch NT (unable)  COORDINATION: 9 Hole Peg test: Right: NT sec; Left: unable sec 09/01/22: L NT (unable)  SENSATION: LUE: Light touch: Impaired  Proprioception: Impaired   EDEMA: None  MUSCLE TONE: LUE: Severe and Hypertonic sublux 1 thumb breadth  COGNITION: Overall cognitive status:  Decreased awareness of deficits  VISION: Subjective report: Pt reports decreased peripheral vision on the L Baseline vision: No visual deficits Visual history: N/A  VISION ASSESSMENT: Tracking/Visual pursuits: Decreased smoothness of eye movement to Left superior field, Decreased smoothness of eye movement to Left inferior field, and Requires cues, head turns, or add eye shifts to track Saccades: additional eye shifts occurred during testing and additional head turns occurred during testing Visual Fields: Left homonymous hemianopsia  PERCEPTION: Not tested, will assess in functional contexts with additional visits  PRAXIS: Impaired: Motor planning  OBSERVATIONS: L hand rests in flexion on lap tray, improved hand hygiene this date  TODAY'S TREATMENT:            Therapeutic Exercise:  Pt. tolerated PROM throughout the LUE, including all ranges for the L shoulder, scapular mobilizations for elevation, depression, abduction/rotation, elbow, forearm, wrist flex/ext, and digit flex/ext.  Encouraged pt to continually perform self ROM for the elbow, forearm, wrist, and hand daily.    Neuro re-ed: Pt continues to verbalize his desire to drive, which he states would thoroughly improve his QOL and indep. Administered Trail Making Test this date, as OT explained this tool can be used for predicting the risk of unsafe driving in stroke patients.  Pt eager and in agreement to complete this assessment.  Trail making test assesses complex attention, visual search and scanning, sequencing, and ability to execute and modify a plan of action. Part  A: 164 sec, 1 error. (Max score of 100 sec)= fail/discontinued Part B: 419 sec, 1 error.  (Max score of 300 sec)=fail/discontinued Results indicate high risk of unsafe driving.  Self Care: Reviewed results of Trail Making Test with pt and spouse.  Provided self reflection questions for pt regarding his scores and his severe L neglect and how these could impact his ability  to drive.  Pt acknowledged that "something could pop up" on the road in his L visual field and he might not see it.  Though pt contradicted himself in stating, "but I could do it," meaning "he could drive."  Pt was quite preoccupied with receiving driver rehabilitation locations, despite OT education on pt lacking cognitive abilities for safe driving.  OT provided written information as requested to pt and spouse re: 2 local locations, but explained to spouse that pt would likely have his license pulled from the [facility] should he not meet the qualifications for driving.  Pt/spouse verbalized understanding.    PATIENT EDUCATION: Education details: Educational psychologist locations Person educated: Patient and Spouse Education method: explanation, written information Education comprehension: verbalized understanding  HOME EXERCISE PROGRAM: PROM throughout the LUE  GOALS: Goals reviewed with patient? Yes  SHORT TERM GOALS: Target date: 10/27/22 (6 weeks)  Pt/caregivers will demo indep with HEP for reducing pain and stiffness throughout the LUE. Baseline: Not yet initiated; 09/01/22: Pt performs self passive stretching daily throughout the LUE, but caregiver is needed to meet end ranges and min vc for optimal technique.  Spouse reports pt has new caregivers and they are not yet performing ROM, but spouse plans to develop a schedule.  Goal status: ongoing  2.  Pt/caregivers will demo supportive positioning techniques with min vc to support L hemiparetic limb. Baseline: Educ not yet initiated; 09/01/22: Pt has just received his wc lap tray but  he does not like it, per spouse.  Pt is inconsistent to use supportive positioning via other methods that have been recommended, including use of pillows to support L shoulder sublux.  Pt does tuck his L hand into gait belt while working with PT during mobility and spouse reports that pt positions L arm on arm rest of couch at home. Goal status: ongoing  3.  Pt/caregivers will follow L hand splinting schedule with at last 50% compliance in order to reduce contracture risk and to optimize skin integrity of L hand. Baseline: Not yet initiated; 09/01/22: <50% compliance; pt reports inconsistent wearing time and splint was not brought to therapy session today.  OT encouraged pt bring to each session for OT to assess need for splinting adjustments as needed.   Goal status: ongoing   LONG TERM GOALS: Target date: 12/08/22 (12 weeks)  Pt will increase FOTO score to 29 or more to indicate increased indep with daily tasks.   Baseline: Eval: 20 (predicted 29); 09/01/22: 19 Goal status: ongoing  2.  Pt will transfer wc<>BSC with min guard Baseline: min-mod A; 09/01/22 min A consistently, wc to couch or wc to bed with supv Goal status: ongoing  3.  Pt will don/doff shirt with min A Baseline: Mod-max A; 09/01/22: supv to doff sweatshirt, min A to don Goal status: achieved  4.  Pt will manage clothing for toileting with min A Baseline: max A-dep; 08/31/12: mod A  Goal status: ongoing  5.  Pt will increase L shoulder strength by 2 MM grade to assist with repositioning L upper arm away from side for underarm care and donning shirt. Baseline: L shoulder 0/5; 09/01/22: L shoulder 1/5 (but must use R arm to reposition the L) Goal status: ongoing, (revised from 1 MM grade to 2 MM grades)  6.  Through manual therapy, neuro re-ed, and therapeutic exercises, pt will report 1/10 pain or less throughout the LUE to increase tolerance for engaging the LUE into ADLs.   Baseline: LUE 5/10 pain, 09/01/22:  Pt reports 2/10 pain  throughout the LUE Goal status: ongoing (revised from 3/10 pain or less to 1/10 pain or less)  7.  Pt will perform supine<>sit bed transfer with supv and AE as needed into his queen size bed. Baseline: Mod A into hospital bed; modified indep with bed rail and trapeze into hospital bed, but pt wants to transition to his queen size bed and has not yet attempted this transfer.  Spouse reports that they may plan to order a bed rail.   Goal status: ongoing  ASSESSMENT:  CLINICAL IMPRESSION: Pt arrived from PT session to OT session with head down, hand over face.  Pt reported, "I'm tired of living like this."  OT assessed for suicidal ideation; pt reports no desire for self harm or harming others, and acknowledged those actions are not inline with his faith.  OT did review discussion with spouse, and spouse acknowledged awareness of pt's feelings and that pt is taking medication for depression.  Pt remained quite preoccupied with seeking driver rehabilitation/assessment services, which OT did provide per pt request.  Pt completed Trail Making Test today, failing both parts A and B, indicating high risk for unsafe driving.  OT reviewed results with pt and spouse and educated on pt's decreased awareness into his deficits and severe L neglect which would impact his ability to safely operate a vehicle.  Pt/spouse acknowledged eduction but pt does plan to call resources provided today.  Pt arrived without his lap tray or a pillow to support his Demetrio Lapping this date.  OT provided pillow to support LUE during session.  Pt continues to have decreased tolerance for LUE ROM.  Pt will continue to benefit from skilled OT to work towards optimal positioning of the LUE for contracture prevention, maximize ROM throughout the LUE for ADLs, decrease pain, maximize engagement of the LUE into daily tasks, as well work to maximize level of indep with daily tasks.   PERFORMANCE DEFICITS: in functional skills including ADLs, IADLs,  coordination, dexterity, proprioception, sensation, tone, ROM, strength, pain, flexibility, Fine motor control, Gross motor control, mobility, balance, body mechanics, decreased knowledge of use of DME, skin integrity, vision, and UE functional use, cognitive skills including perception and safety awareness, and psychosocial skills including coping strategies and routines and behaviors.   IMPAIRMENTS: are limiting patient from ADLs, IADLs, leisure, and social participation.   CO-MORBIDITIES: may have co-morbidities  that affects occupational performance. Patient will benefit from skilled OT to address above impairments and improve overall function.  MODIFICATION OR ASSISTANCE TO COMPLETE EVALUATION: No modification of tasks or assist necessary to complete an evaluation.  OT OCCUPATIONAL PROFILE AND HISTORY: Problem focused assessment: Including review of records relating to presenting problem.  CLINICAL DECISION MAKING: Moderate - several treatment options, min-mod task modification necessary  REHAB POTENTIAL: Good  EVALUATION COMPLEXITY: High    PLAN:  OT FREQUENCY: 2x/week  OT DURATION: 12 weeks  PLANNED INTERVENTIONS: self care/ADL training, therapeutic exercise, therapeutic activity, neuromuscular re-education, manual therapy, passive range of motion, balance training, functional mobility training, splinting, electrical stimulation, moist heat, cryotherapy, patient/family education, cognitive remediation/compensation, visual/perceptual remediation/compensation, psychosocial skills training, coping strategies training, and DME and/or AE instructions  RECOMMENDED OTHER SERVICES: N/A  CONSULTED AND AGREED WITH PLAN OF CARE: Patient and family member/caregiver  PLAN FOR NEXT SESSION: see plan  Danelle Earthly, MS, OTR/L

## 2022-10-13 ENCOUNTER — Ambulatory Visit: Payer: Medicaid Other

## 2022-10-14 ENCOUNTER — Ambulatory Visit: Payer: Medicaid Other

## 2022-10-14 DIAGNOSIS — M6281 Muscle weakness (generalized): Secondary | ICD-10-CM

## 2022-10-14 DIAGNOSIS — R278 Other lack of coordination: Secondary | ICD-10-CM

## 2022-10-14 NOTE — Therapy (Signed)
OUTPATIENT OCCUPATIONAL THERAPY NEURO TREATMENT NOTE  Patient Name: Jacob [name] MRN: [id number] DOB:[birth year], 47 y.o., male Today's Date: 10/14/2022  PCP: Dr. [name] REFERRING PROVIDER: Dr. Michel Santee Eyk  END OF SESSION:  OT End of Session - 10/14/22 1317     Visit Number 15    Number of Visits 24    Date for OT Re-Evaluation 12/08/22    Authorization Type **Approved for additional visits as of 09/23/2022. D/C at the 4th approved visit*    Authorization Time Period *10/14/2022: 3 of 4 completed*    Progress Note Due on Visit 10    OT Start Time 1151    OT Stop Time 1233    OT Time Calculation (min) 42 min    Equipment Utilized During Treatment wc    Activity Tolerance Patient tolerated treatment well    Behavior During Therapy WFL for tasks assessed/performed                Past Medical History:  Diagnosis Date   Allergy    Diabetes mellitus without complication (HCC)    GERD (gastroesophageal reflux disease)    Hypertension    Paralysis (HCC)    Seizures (HCC)    Stroke [facility])    Past Surgical History:  Procedure Laterality Date   APPENDECTOMY     BUBBLE STUDY  10/02/2021   Procedure: BUBBLE STUDY;  Surgeon: Pricilla Riffle, MD;  Location: [facility] ENDOSCOPY;  Service: Cardiovascular;;   CRANIOPLASTY Right 02/09/2022   Procedure: Craniectomy with Replacement of Bone Flap From the Abdomen;  Surgeon: Tressie Stalker, MD;  Location: [facility] OR;  Service: Neurosurgery;  Laterality: Right;   CRANIOTOMY Right 09/15/2021   Procedure: RIGHT DECOMPRESSIVE CRANIOTOMY, PLACEMENT OF BONE FLAP IN ABDOMEN;  Surgeon: Tressie Stalker, MD;  Location: [facility] OR;  Service: Neurosurgery;  Laterality: Right;   IR CT HEAD LTD  09/12/2021   IR PERCUTANEOUS ART THROMBECTOMY/INFUSION INTRACRANIAL INC DIAG ANGIO  09/12/2021   IR US GUIDE VASC ACCESS RIGHT  09/12/2021   LAPAROSCOPIC APPENDECTOMY  10/24/2013   Procedure: APPENDECTOMY LAPAROSCOPIC;  Surgeon: Kandis Cocking, MD;  Location: [facility] ORS;   Service: General;;   RADIOLOGY WITH ANESTHESIA N/A 09/12/2021   Procedure: IR WITH ANESTHESIA;  Surgeon: Julieanne Cotton, MD;  Location: [facility] OR;  Service: Radiology;  Laterality: N/A;   TEE WITHOUT CARDIOVERSION N/A 10/02/2021   Procedure: TRANSESOPHAGEAL ECHOCARDIOGRAM (TEE);  Surgeon: Pricilla Riffle, MD;  Location: [facility] ENDOSCOPY;  Service: Cardiovascular;  Laterality: N/A;   Patient Active Problem List   Diagnosis Date Noted   Annual physical exam 09/22/2022   Cerebrovascular disease 09/22/2022   History of DVT (deep vein thrombosis) 09/22/2022   Spastic hemiplegia of left nondominant side as late effect of cerebral infarction (HCC) 09/22/2022   Seizure disorder (HCC) 09/22/2022   BMI 27.0-27.9,adult 09/22/2022   Mild episode of recurrent major depressive disorder (HCC) 06/15/2022   Status post craniectomy 02/09/2022   Urinary retention 09/18/2021   Vitamin D deficiency 11/04/2020   Hyperlipidemia 03/28/2018   Diabetes mellitus without complication (HCC) 02/23/2018   Essential hypertension 02/23/2018   ONSET DATE: April 2023  REFERRING DIAG: I63.9 (ICD-10-CM) - Cerebral infarction, unspecified   THERAPY DIAG:   Muscle weakness (generalized)  Other lack of coordination  Rationale for Evaluation and Treatment: Rehabilitation  SUBJECTIVE:  SUBJECTIVE STATEMENT: Pt reported that he brought some pants in to practice donning today. Pt accompanied by: self and significant other  PERTINENT HISTORY: Per chart, Jacob [name] is  a 47 y.o. male with a history of prior CVA with decompressive craniectomy in April 2023.  He has a history of right peroneal DVT on Eliquis.  He has left hemiparesis from stroke.   Per spouse, pt spent some time in [facility], then 2 months at [facility] (SNF). He presented for elective cranioplasty in Sept 2023.  Pt was admitted to rehab 02/14/2022 for inpatient therapies following cranioplasty, and participated in [facility] therapies until recently.       PRECAUTIONS: Fall  WEIGHT BEARING RESTRICTIONS: No  PAIN: 10/14/22: no pain reported today in L shoulder but no stretching performed this date  Are you having pain? Yes: NPRS scale: 0 (rest), when stretching 2/10 Pain location: L hand, L shoulder sublux Pain description: achy Aggravating factors: stretching of the L hand, movement of the L shoulder  Relieving factors: lying down and gentle stretching   FALLS: Has patient fallen in last 6 months? Yes. Number of falls 1  LIVING ENVIRONMENT: Lives with: lives with their spouse and 60 y/o son, 1 cousin, and 1 friend Lives in: 2 level with master bedroom on the main level, pt resides only on 1st level Stairs: 1 step no rail to enter, pt does have small ramp Has following equipment at home: Hemi walker, Wheelchair (manual), Shower bench, bed side commode, Grab bars, Ramped entry, and platform walker (grab bar by the toilet but not in the shower.  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  PATIENT GOALS: Pt wants to get back to walking and being as indep as possible.   OBJECTIVE:   HAND DOMINANCE: Right  ADLs: Overall ADLs: HHA Mon-Fri 5 hours/day; spouse is pt's primary caregiver Transfers/ambulation related to ADLs: min A sit to stand pivot from wc using gait belt  Eating: assist to cut food  Grooming: min A to manage facial hair UB Dressing: max A LB Dressing: max-dep A Toileting: dep, wears adult briefs.  Spouse reports pt has just started transitioning to use of BSC over the toilet vs voiding in a brief Bathing: max A with sponge/bed bath (in the process of getting bathroom re-done to enable pt to take a shower) Tub Shower transfers: N/A at this time; pt states he doesn't trust himself with the glass doors. Equipment:  see above  IADLs: Shopping: dep Light housekeeping: dep Meal Prep: dep Community mobility: wc dep  Medication management: dep Financial  management: dep Handwriting:  Pt is R hand dominant, NT  MOBILITY STATUS:  Pt reports he can amb with hemi-walker 10-20 ft with max A (per spouse) and someone following with a wc behind him  POSTURE COMMENTS:  rounded shoulders, forward head, and L sided hemiplegia (L shoulder subluxation ~1 thumb breadth) Sitting balance:  able to sit edge of mat and tolerate moderate perturbation from all directions.  Pt can reach forward to ankles and return to midline without LOB.  FUNCTIONAL OUTCOME MEASURES: FOTO: 20; predicted 29 09/01/22: 19  UPPER EXTREMITY ROM:    Passive ROM Right Eval (A/PROMWNL) Left eval Left 09/01/22  Shoulder flexion  78 90  Shoulder abduction  60 68  Shoulder adduction     Shoulder extension     Shoulder internal rotation     Shoulder external rotation  -10 (elbow at side) -10  Elbow flexion     Elbow extension     Wrist flexion  70 80  Wrist extension  48 58  Wrist ulnar  deviation     Wrist radial deviation     Wrist pronation  90 90  Wrist supination  60 82  (Blank rows = not tested)  09/01/22: L hand passively opens ~75% of full extension d/t PIP and MP tightness in all digits.  UPPER EXTREMITY MMT:     MMT Right eval Left eval Left  09/01/22  Shoulder flexion 5 0 1  Shoulder abduction 5 0 1  Shoulder adduction     Shoulder extension     Shoulder internal rotation     Shoulder external rotation     Middle trapezius     Lower trapezius     Elbow flexion 5 0 1  Elbow extension 5 0 1  Wrist flexion 5 0 1  Wrist extension 5 0 1  Wrist ulnar deviation     Wrist radial deviation     Wrist pronation  0 1  Wrist supination  0 1  (Blank rows = not tested)  HAND FUNCTION: Grip strength: Right: 95 lbs; Left: NT lbs, Lateral pinch: Right: 25 lbs, Left: NT lbs, and 3 point pinch: Right: 20 lbs, Left: NT lbs 09/01/22: L grip/pinch NT (unable)  COORDINATION: 9 Hole Peg test: Right: NT sec; Left: unable sec 09/01/22: L NT (unable)  SENSATION: LUE: Light  touch: Impaired  Proprioception: Impaired   EDEMA: None  MUSCLE TONE: LUE: Severe and Hypertonic sublux 1 thumb breadth  COGNITION: Overall cognitive status:  Decreased awareness of deficits  VISION: Subjective report: Pt reports decreased peripheral vision on the L Baseline vision: No visual deficits Visual history: N/A  VISION ASSESSMENT: Tracking/Visual pursuits: Decreased smoothness of eye movement to Left superior field, Decreased smoothness of eye movement to Left inferior field, and Requires cues, head turns, or add eye shifts to track Saccades: additional eye shifts occurred during testing and additional head turns occurred during testing Visual Fields: Left homonymous hemianopsia  PERCEPTION: Not tested, will assess in functional contexts with additional visits  PRAXIS: Impaired: Motor planning  OBSERVATIONS: L hand rests in flexion on lap tray, improved hand hygiene this date  TODAY'S TREATMENT:            Self Care: Focus on LB dressing training this date with intermittent use of leg lifter, dressing stick, and reacher.  Pt transferred from wc to edge of mat table for LB dressing to simulate positioning at home, as pt reports that he would usually dress from the edge of his bed.  Pt performed this transfer today SBA, min vc to angle wc at 45* from bed to reduce fall risk during pivot transfer.  Pt able to doff shoes with supv and extra time using reacher and dressing stick, vc for hooking R foot under L to slide off shoe.  Pt practiced threading L leg through pants in 2 positions, including crossing L leg with leg resting on bed and foot hanging off bed, and then in a long sitting positioning with L leg on bed and R leg on floor.  Pt found L leg crossed to be easier to thread pant leg, but hips and knees are stiff, making this position a challenge.  OT advised on LE stretching to ease these positions.  Min A to thread L leg into pants, and vc required to orient pt to R pant leg  to prevent 2 legs in 1 pant leg.  Pt able to stand and lower/hike pants, including over L hip, with min guard and cues for widening BOS.  Pt  had legs pressed up against edge of mat table for stability in standing.  Extra time to don R shoe with reacher, max A to don L shoe.  Encouraged pt practice with his equipment at home at minimum 3x per week to increase efficiency and independence.  Pt/spouse verbalized understanding.    PATIENT EDUCATION: Education details: LB dressing training Person educated: Patient and Spouse Education method: explanation, demo, vc, min A Education comprehension: verbalized understanding, demonstrated understanding with min A, further training needed  HOME EXERCISE PROGRAM: PROM throughout the LUE  GOALS: Goals reviewed with patient? Yes  SHORT TERM GOALS: Target date: 10/27/22 (6 weeks)  Pt/caregivers will demo indep with HEP for reducing pain and stiffness throughout the LUE. Baseline: Not yet initiated; 09/01/22: Pt performs self passive stretching daily throughout the LUE, but caregiver is needed to meet end ranges and min vc for optimal technique.  Spouse reports pt has new caregivers and they are not yet performing ROM, but spouse plans to develop a schedule.  Goal status: ongoing  2.  Pt/caregivers will demo supportive positioning techniques with min vc to support L hemiparetic limb. Baseline: Educ not yet initiated; 09/01/22: Pt has just received his wc lap tray but he does not like it, per spouse.  Pt is inconsistent to use supportive positioning via other methods that have been recommended, including use of pillows to support L shoulder sublux.  Pt does tuck his L hand into gait belt while working with PT during mobility and spouse reports that pt positions L arm on arm rest of couch at home. Goal status: ongoing  3.  Pt/caregivers will follow L hand splinting schedule with at last 50% compliance in order to reduce contracture risk and to optimize skin integrity  of L hand. Baseline: Not yet initiated; 09/01/22: <50% compliance; pt reports inconsistent wearing time and splint was not brought to therapy session today.  OT encouraged pt bring to each session for OT to assess need for splinting adjustments as needed.   Goal status: ongoing   LONG TERM GOALS: Target date: 12/08/22 (12 weeks)  Pt will increase FOTO score to 29 or more to indicate increased indep with daily tasks.   Baseline: Eval: 20 (predicted 29); 09/01/22: 19 Goal status: ongoing  2.  Pt will transfer wc<>BSC with min guard Baseline: min-mod A; 09/01/22 min A consistently, wc to couch or wc to bed with supv Goal status: ongoing  3.  Pt will don/doff shirt with min A Baseline: Mod-max A; 09/01/22: supv to doff sweatshirt, min A to don Goal status: achieved  4.  Pt will manage clothing for toileting with min A Baseline: max A-dep; 08/31/12: mod A  Goal status: ongoing  5.  Pt will increase L shoulder strength by 2 MM grade to assist with repositioning L upper arm away from side for underarm care and donning shirt. Baseline: L shoulder 0/5; 09/01/22: L shoulder 1/5 (but must use R arm to reposition the L) Goal status: ongoing, (revised from 1 MM grade to 2 MM grades)  6.  Through manual therapy, neuro re-ed, and therapeutic exercises, pt will report 1/10 pain or less throughout the LUE to increase tolerance for engaging the LUE into ADLs.   Baseline: LUE 5/10 pain, 09/01/22: Pt reports 2/10 pain throughout the LUE Goal status: ongoing (revised from 3/10 pain or less to 1/10 pain or less)  7.  Pt will perform supine<>sit bed transfer with supv and AE as needed into his queen size bed. Baseline:  Mod A into hospital bed; modified indep with bed rail and trapeze into hospital bed, but pt wants to transition to his queen size bed and has not yet attempted this transfer.  Spouse reports that they may plan to order a bed rail.   Goal status: ongoing  ASSESSMENT:  CLINICAL IMPRESSION: Pt arrived  with LUE supported on lap tray this date.  Provided positive feedback for remembering to support LUE in this way.  Focus on LB dressing training working to don/doff pants and shoes with intermittent use of reacher, dressing stick, and leg lifter.  Pt required min A, extra time, and sequencing cues for donning/doffing pants, supv to don R shoe with reacher, and max A to don L shoe with reacher.  Pt practiced crossing L leg into R thigh while sitting EOB, and also practiced L leg in long sitting on edge of bed with R leg on floor.  Pt found crossing L leg into R thigh to be the easier position for threading L pant leg.  Pt reports he typically ends up threading R leg into the same pant leg as the left leg; OT provided verbal and visual cues to orient pt to each pant leg, encouraging pt to find the middle of his pants at the waist ties.  Pt required intermittent cues to support LUE in lap or on mat table while dressing and during pivot transfer.  With pivot back to wc, pt initiated supporting L hand by securing it in his gait belt like a sling.  Pt requires extensive time for LB dressing tasks, but encouraged pt that with consistent practice, efficiency and indep should improve.  Advised pt to practice min of 3x per week with dressing tasks when pt has the time and caregiver support.  Pt verbalized understanding.  Pt will continue to benefit from skilled OT to work towards optimal positioning of the LUE for contracture prevention, maximize ROM throughout the LUE for ADLs, decrease pain, maximize engagement of the LUE into daily tasks, as well work to maximize level of indep with daily tasks.   PERFORMANCE DEFICITS: in functional skills including ADLs, IADLs, coordination, dexterity, proprioception, sensation, tone, ROM, strength, pain, flexibility, Fine motor control, Gross motor control, mobility, balance, body mechanics, decreased knowledge of use of DME, skin integrity, vision, and UE functional use, cognitive  skills including perception and safety awareness, and psychosocial skills including coping strategies and routines and behaviors.   IMPAIRMENTS: are limiting patient from ADLs, IADLs, leisure, and social participation.   CO-MORBIDITIES: may have co-morbidities  that affects occupational performance. Patient will benefit from skilled OT to address above impairments and improve overall function.  MODIFICATION OR ASSISTANCE TO COMPLETE EVALUATION: No modification of tasks or assist necessary to complete an evaluation.  OT OCCUPATIONAL PROFILE AND HISTORY: Problem focused assessment: Including review of records relating to presenting problem.  CLINICAL DECISION MAKING: Moderate - several treatment options, min-mod task modification necessary  REHAB POTENTIAL: Good  EVALUATION COMPLEXITY: High    PLAN:  OT FREQUENCY: 2x/week  OT DURATION: 12 weeks  PLANNED INTERVENTIONS: self care/ADL training, therapeutic exercise, therapeutic activity, neuromuscular re-education, manual therapy, passive range of motion, balance training, functional mobility training, splinting, electrical stimulation, moist heat, cryotherapy, patient/family education, cognitive remediation/compensation, visual/perceptual remediation/compensation, psychosocial skills training, coping strategies training, and DME and/or AE instructions  RECOMMENDED OTHER SERVICES: N/A  CONSULTED AND AGREED WITH PLAN OF CARE: Patient and family member/caregiver  PLAN FOR NEXT SESSION: see plan  Danelle Earthly, MS, OTR/L

## 2022-10-20 ENCOUNTER — Other Ambulatory Visit: Payer: Self-pay | Admitting: Internal Medicine

## 2022-10-21 ENCOUNTER — Ambulatory Visit: Payer: Medicaid Other | Admitting: Neurology

## 2022-10-21 ENCOUNTER — Ambulatory Visit: Payer: Medicaid Other | Admitting: Occupational Therapy

## 2022-10-21 ENCOUNTER — Ambulatory Visit: Payer: Medicaid Other | Admitting: Physical Therapy

## 2022-10-21 DIAGNOSIS — G40109 Localization-related (focal) (partial) symptomatic epilepsy and epileptic syndromes with simple partial seizures, not intractable, without status epilepticus: Secondary | ICD-10-CM | POA: Diagnosis not present

## 2022-10-21 NOTE — Procedures (Signed)
    History:  47 year old man with epilepsy   EEG classification: Awake and drowsy  Description of the recording: The background rhythms of this recording consists of a fairly well modulated medium amplitude alpha rhythm of 10 Hz that is reactive to eye opening and closure. Present in the anterior head region is a 15-20 Hz beta activity. Photic stimulation was performed, did not show any abnormalities. Hyperventilation was also performed, did not show any abnormalities. Drowsiness was manifested by background fragmentation. No abnormal epileptiform discharges seen during this recording. There was presence of right hemispheric slowing. There were no electrographic seizure identified.   Abnormality: Right hemispheric slowing    Impression: This is an abnormal EEG recorded while drowsy and awake due to right hemispheric slowing. This is consistent with an area of neuronal dysfunction in the right hemisphere.     Jacob Norfolk, MD [facility]

## 2022-10-22 ENCOUNTER — Ambulatory Visit: Payer: Medicaid Other

## 2022-10-22 ENCOUNTER — Ambulatory Visit: Payer: Medicaid Other | Admitting: Physical Therapy

## 2022-10-22 DIAGNOSIS — M6281 Muscle weakness (generalized): Secondary | ICD-10-CM

## 2022-10-22 DIAGNOSIS — R278 Other lack of coordination: Secondary | ICD-10-CM

## 2022-10-22 DIAGNOSIS — R262 Difficulty in walking, not elsewhere classified: Secondary | ICD-10-CM

## 2022-10-22 DIAGNOSIS — R2681 Unsteadiness on feet: Secondary | ICD-10-CM

## 2022-10-22 DIAGNOSIS — R269 Unspecified abnormalities of gait and mobility: Secondary | ICD-10-CM

## 2022-10-22 NOTE — Therapy (Signed)
OUTPATIENT PHYSICAL THERAPY NEURO TREATMENT/Re-certification   Patient Name: Jacob [name] MRN: [id number] DOB:[birth year], 47 y.o., male Today's Date: 10/22/2022   PCP: Dr. [name] REFERRING PROVIDER: Dr. Michel Santee Eyk  END OF SESSION:  PT End of Session - 10/22/22 1026     Visit Number 15    Number of Visits 36    Date for PT Re-Evaluation 11/04/22    Authorization Type [place] Medicaid    Progress Note Due on Visit 20    PT Start Time 1028    PT Stop Time 1102    PT Time Calculation (min) 34 min    Equipment Utilized During Treatment Gait belt    Activity Tolerance Patient tolerated treatment well    Behavior During Therapy WFL for tasks assessed/performed                    Past Medical History:  Diagnosis Date   Allergy    Diabetes mellitus without complication (HCC)    GERD (gastroesophageal reflux disease)    Hypertension    Paralysis (HCC)    Seizures (HCC)    Stroke [facility])    Past Surgical History:  Procedure Laterality Date   APPENDECTOMY     BUBBLE STUDY  10/02/2021   Procedure: BUBBLE STUDY;  Surgeon: Pricilla Riffle, MD;  Location: [facility] ENDOSCOPY;  Service: Cardiovascular;;   CRANIOPLASTY Right 02/09/2022   Procedure: Craniectomy with Replacement of Bone Flap From the Abdomen;  Surgeon: Tressie Stalker, MD;  Location: [facility] OR;  Service: Neurosurgery;  Laterality: Right;   CRANIOTOMY Right 09/15/2021   Procedure: RIGHT DECOMPRESSIVE CRANIOTOMY, PLACEMENT OF BONE FLAP IN ABDOMEN;  Surgeon: Tressie Stalker, MD;  Location: [facility] OR;  Service: Neurosurgery;  Laterality: Right;   IR CT HEAD LTD  09/12/2021   IR PERCUTANEOUS ART THROMBECTOMY/INFUSION INTRACRANIAL INC DIAG ANGIO  09/12/2021   IR US GUIDE VASC ACCESS RIGHT  09/12/2021   LAPAROSCOPIC APPENDECTOMY  10/24/2013   Procedure: APPENDECTOMY LAPAROSCOPIC;  Surgeon: Kandis Cocking, MD;  Location: [facility] ORS;  Service: General;;   RADIOLOGY WITH ANESTHESIA N/A 09/12/2021   Procedure: IR WITH ANESTHESIA;  Surgeon:  Julieanne Cotton, MD;  Location: [facility] OR;  Service: Radiology;  Laterality: N/A;   TEE WITHOUT CARDIOVERSION N/A 10/02/2021   Procedure: TRANSESOPHAGEAL ECHOCARDIOGRAM (TEE);  Surgeon: Pricilla Riffle, MD;  Location: [facility] ENDOSCOPY;  Service: Cardiovascular;  Laterality: N/A;   Patient Active Problem List   Diagnosis Date Noted   Annual physical exam 09/22/2022   Cerebrovascular disease 09/22/2022   History of DVT (deep vein thrombosis) 09/22/2022   Spastic hemiplegia of left nondominant side as late effect of cerebral infarction (HCC) 09/22/2022   Seizure disorder (HCC) 09/22/2022   BMI 27.0-27.9,adult 09/22/2022   Mild episode of recurrent major depressive disorder (HCC) 06/15/2022   Status post craniectomy 02/09/2022   Urinary retention 09/18/2021   Vitamin D deficiency 11/04/2020   Hyperlipidemia 03/28/2018   Diabetes mellitus without complication (HCC) 02/23/2018   Essential hypertension 02/23/2018    ONSET DATE: 08/2021  REFERRING DIAG:  Diagnosis  I63.9 (ICD-10-CM) - Cerebral infarction, unspecified  R53.81 (ICD-10-CM) - Other malaise    THERAPY DIAG:  Muscle weakness (generalized)  Other lack of coordination  Difficulty in walking, not elsewhere classified  Abnormality of gait and mobility  Unsteadiness on feet  Rationale for Evaluation and Treatment: Rehabilitation  SUBJECTIVE:  SUBJECTIVE STATEMENT: Patient reports that he is doing well. No pain reported. Pt late to PT treatment on this day. Pt unsure of reason.   Pt accompanied by: significant other- Wife  PERTINENT HISTORY: Patient is a 47 year old male with recent history of CVA  in April 2023 and then elective cranioplasty in September 2023 with subsequent inpatient rehab and then home health services. Patient has left  hemiparesis.   PAIN:  Are you having pain? No  PRECAUTIONS: Fall  WEIGHT BEARING RESTRICTIONS: No  FALLS: Has patient fallen in last 6 months? Yes. 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   RADIATION DOSE REDUCTION: This exam was performed according to the departmental dose-optimization program which includes automated exposure control, adjustment of the mA and/or kV according to patient size and/or use of iterative reconstruction technique.   COMPARISON:  09/19/2021   FINDINGS: Brain: Diffuse cytotoxic edema is noted throughout the distribution of the right MCA and PCA watershed region similar to that seen on the prior exam. No parenchymal hemorrhage is seen. No new focal infarct is seen.   Vascular: No hyperdense vessel or unexpected calcification.   Skull: Large calvarial defect there is noted on the right stable from the prior study.   Sinuses/Orbits: Paranasal sinuses are within normal limits.   Other: Gastric catheter and feeding catheter are noted extending through the nose.   IMPRESSION: Right MCA/PCA infarct stable in appearance from  the previous exam. No acute hemorrhage is noted.   Right-sided hemi craniectomy stable from the prior exam. No new focal abnormality is noted.     Electronically Signed   By: Alcide Clever M.D.   On: 09/24/2021 02:47  COGNITION: Overall cognitive status: Impaired-decreased awareness of functional limitations   SENSATION: Light touch: Impaired - decreased  COORDINATION:  Left side hemiparesis  EDEMA:  None observed  MUSCLE TONE: LLE: Hypotonic   POSTURE: rounded shoulders and forward head   LOWER EXTREMITY MMT:    MMT Right Eval Left Eval  Hip flexion 5 2-  Hip extension 5 2-  Hip abduction 5 2-  Hip adduction 5 2-  Hip internal rotation 5 2-  Hip external rotation 5 2-  Knee flexion 5 2-  Knee extension 5 2-  Ankle dorsiflexion 5  0   Ankle plantarflexion    Ankle inversion    Ankle eversion    (Blank rows = not tested)  BED MOBILITY:  Not tested  TRANSFERS: Assistive device utilized: Hemi walker  Sit to stand: Mod A Stand to sit: Mod A Chair to chair: Mod A Floor:  Not tested   STAIRS: *Patient adamantly wanted to attempt today Level of Assistance: Max A Stair  Negotiation Technique: Step to Pattern with Bilateral Rails Number of Stairs: 1  Height of Stairs: 6"  Comments: Patient wanted to attempt steps continuously requesting to try- He was able to stand from w/c with mod assist using right arm for support on armrest of chair with gait belt. Once in standing he was able to step up with right LE with physical assist to block his left LE yet able to flex left hip/knee enough to clear 1st step- positioned back in chair.   GAIT: Gait pattern: step to pattern Distance walked: 8 feet Assistive device utilized: Hemi walker Level of assistance: Mod A Comments: Patient required VC for seqencing and use of left AFO today. - fatigues very quickly and left knee buckling with weight bearing.   FUNCTIONAL TESTS:  5 times sit to stand: Patient required mod  Assist to stand and unable to complete on his own.  PATIENT SURVEYS:  FOTO 36; predicted goal= 47  TODAY'S TREATMENT:                                                                                                                              DATE: 10/22/22  No AFO available for this PT session.   Pt performed pregait stepping forward/reverse R and L. 2 x 8 bil upper extremity supported on hemiwalker.  Mod assist from PT with fatigue on second bout to improve erect posture and hip extension.  PT also required to facilitate weight shift over left lower extremity to forced improved weightbearing.  Forward reverse gait with HW 2x 68ft.  Min to mod assist from PT for improved posture cues for proper assistive device management.  Facilitation required for adequate weight shift and step length on the left lower extremity in retroversion.  Side stepping at rail on wall 30ft L and R 2 x 2 bouts.  Min assist from PT for improved posture and cues for improved attention to an activation of the left quadriceps and glutes.  Noted to have decreased postural control with fatigue at end of first bout.       PATIENT EDUCATION: Education details: PT plan of care and HEP Person educated: Patient and Spouse Education method: Explanation, Demonstration, Tactile cues, Verbal cues, and Handouts Education comprehension: verbalized understanding, returned demonstration, verbal cues required, tactile cues required, and needs further education  HOME EXERCISE PROGRAM: Access Code: [email or web address] URL: [email or web address] Date: 06/23/2022 Prepared by: Maureen Ralphs  Exercises - Supine Quad Set  - 1 x daily - 7 x weekly - 3 sets - 10 reps - Seated Long Arc Quad  - 1 x daily - 7 x weekly - 3 sets - 10 reps - Seated March  - 1 x daily - 7 x weekly - 3 sets - 10 reps - Supine Bridge  - 1 x daily - 7 x weekly - 3 sets - 10 reps - Sit to Stand with Counter Support  - 1 x daily - 7 x weekly -  3 sets -  10 reps  GOALS: Goals reviewed with patient? Yes  SHORT TERM GOALS: Target date: 08/04/2022  Pt will be independent with HEP in order to improve strength and balance in order to decrease fall risk and improve function at home and work.  Baseline: EVAL-Patient with no formal HEP in place Goal status: INITIAL  2.  Patient will perform all stand pivot transfers with min Assist using hemi-walker for improved functional independence with mobility in home.  Baseline: EVAL= Mod A with transfers Goal status: INITIAL    LONG TERM GOALS: Target date: 09/15/2022  Pt will improve FOTO to target score of 47  to display perceived improvements in ability to complete ADL's.  Baseline: EVAL= 36 Goal status: INITIAL  2.  Pt will achieve 5x STS in less than 60 seconds in order to demonstrate clinically significant improvement in LE strength. Baseline: EVAL- Mod A with sit to stand transfer Goal status: MET INITIAL 3.  Patient will ambulate > 50 feet with hemiwalker using step to gait and CGA for improved household mobility.  Baseline: EVAL- 8 feet with hemiwalker and mod A Goal status: IN PROGRESS  4.  Patient will perform all transfers with CGA  including w/c to various surfaces- toilet, bed, chairs using hemiwalker for improved functional independence with mobility.  Baseline:  EVAL- Mod asst with all transfers Goal status: IN PROGRESS   ASSESSMENT:  CLINICAL IMPRESSION:  Patient presents with good motivation for completion of physical therapy activity, but limited by time as patient arrived to therapy late on this day.  He pt able to demonstrate increased activation of L hip and knee extensors with  constant verbal and tactile cues for attention to the LLE. Pt continues to demonstrate poor awareness of deficits with continued questions about returning to driving. PT attempted to educate pt on challenges to driving with his profound vision loss, but poor evidence of understanding from pt. Pt  will continue to benefit from skilled physical therapy intervention to address impairments, improve QOL, and attain therapy goals.       OBJECTIVE IMPAIRMENTS: Abnormal gait, cardiopulmonary status limiting activity, decreased activity tolerance, decreased balance, decreased cognition, decreased coordination, decreased endurance, decreased mobility, difficulty walking, decreased strength, decreased safety awareness, impaired perceived functional ability, impaired sensation, and impaired UE functional use.   ACTIVITY LIMITATIONS: carrying, lifting, bending, standing, squatting, stairs, transfers, and bed mobility  PARTICIPATION LIMITATIONS: meal prep, cleaning, laundry, driving, shopping, community activity, occupation, and yard work  PERSONAL FACTORS: Time since onset of injury/illness/exacerbation and 1-2 comorbidities: HTN, DM  are also affecting patient's functional outcome.   REHAB POTENTIAL: Good  CLINICAL DECISION MAKING: Evolving/moderate complexity  EVALUATION COMPLEXITY: Moderate  PLAN:  PT FREQUENCY: 1-2x/week  PT DURATION: 12 weeks  PLANNED INTERVENTIONS: Therapeutic exercises, Therapeutic activity, Neuromuscular re-education, Balance training, Gait training, Patient/Family education, Self Care, Joint mobilization, Stair training, Orthotic/Fit training, DME instructions, Dry Needling, Electrical stimulation, Wheelchair mobility training, Spinal mobilization, Cryotherapy, Moist heat, Splintting, Taping, and Manual therapy  PLAN FOR NEXT SESSION:   Review and progress LE strengthening.  Transfer training for safe mobility, Gait training in // bars or with hemiwalker  Add to HEP as appropriate.   Grier Rocher PT, DPT  Physical Therapist - [facility]  11:05 AM 10/22/22

## 2022-10-23 ENCOUNTER — Ambulatory Visit: Payer: Medicaid Other | Admitting: Podiatry

## 2022-10-23 ENCOUNTER — Encounter: Payer: Self-pay | Admitting: Podiatry

## 2022-10-23 DIAGNOSIS — B351 Tinea unguium: Secondary | ICD-10-CM

## 2022-10-23 DIAGNOSIS — M79675 Pain in left toe(s): Secondary | ICD-10-CM

## 2022-10-23 DIAGNOSIS — L0889 Other specified local infections of the skin and subcutaneous tissue: Secondary | ICD-10-CM | POA: Diagnosis not present

## 2022-10-23 DIAGNOSIS — B353 Tinea pedis: Secondary | ICD-10-CM

## 2022-10-23 DIAGNOSIS — M79674 Pain in right toe(s): Secondary | ICD-10-CM | POA: Diagnosis not present

## 2022-10-23 MED ORDER — CLOTRIMAZOLE-BETAMETHASONE 1-0.05 % EX CREA: 1.0000 | TOPICAL_CREAM | Freq: Every day | CUTANEOUS | 0 refills | Status: DC

## 2022-10-23 NOTE — Therapy (Signed)
OUTPATIENT OCCUPATIONAL THERAPY NEURO TREATMENT NOTE  Patient Name: Jacob [name] MRN: [id number] DOB:1976/05/21, 47 y.o., male Today's Date: 10/23/2022  PCP: Dr. [name] REFERRING PROVIDER: Dr. Michel Santee Eyk  END OF SESSION:  OT End of Session - 10/23/22 0900     Visit Number 16    Number of Visits 24    Date for OT Re-Evaluation 12/08/22    Progress Note Due on Visit 10    OT Start Time 1100    OT Stop Time 1145    OT Time Calculation (min) 45 min    Equipment Utilized During Treatment wc    Activity Tolerance Patient tolerated treatment well    Behavior During Therapy --   self limiting behavior/poor motivation               Past Medical History:  Diagnosis Date   Allergy    Diabetes mellitus without complication (HCC)    GERD (gastroesophageal reflux disease)    Hypertension    Paralysis (HCC)    Seizures (HCC)    Stroke [facility])    Past Surgical History:  Procedure Laterality Date   APPENDECTOMY     BUBBLE STUDY  10/02/2021   Procedure: BUBBLE STUDY;  Surgeon: Pricilla Riffle, MD;  Location: [facility] ENDOSCOPY;  Service: Cardiovascular;;   CRANIOPLASTY Right 02/09/2022   Procedure: Craniectomy with Replacement of Bone Flap From the Abdomen;  Surgeon: Tressie Stalker, MD;  Location: [facility] OR;  Service: Neurosurgery;  Laterality: Right;   CRANIOTOMY Right 09/15/2021   Procedure: RIGHT DECOMPRESSIVE CRANIOTOMY, PLACEMENT OF BONE FLAP IN ABDOMEN;  Surgeon: Tressie Stalker, MD;  Location: [facility] OR;  Service: Neurosurgery;  Laterality: Right;   IR CT HEAD LTD  09/12/2021   IR PERCUTANEOUS ART THROMBECTOMY/INFUSION INTRACRANIAL INC DIAG ANGIO  09/12/2021   IR US GUIDE VASC ACCESS RIGHT  09/12/2021   LAPAROSCOPIC APPENDECTOMY  10/24/2013   Procedure: APPENDECTOMY LAPAROSCOPIC;  Surgeon: Kandis Cocking, MD;  Location: [facility] ORS;  Service: General;;   RADIOLOGY WITH ANESTHESIA N/A 09/12/2021   Procedure: IR WITH ANESTHESIA;  Surgeon: Julieanne Cotton, MD;  Location: [facility] OR;  Service:  Radiology;  Laterality: N/A;   TEE WITHOUT CARDIOVERSION N/A 10/02/2021   Procedure: TRANSESOPHAGEAL ECHOCARDIOGRAM (TEE);  Surgeon: Pricilla Riffle, MD;  Location: [facility] ENDOSCOPY;  Service: Cardiovascular;  Laterality: N/A;   Patient Active Problem List   Diagnosis Date Noted   Annual physical exam 09/22/2022   Cerebrovascular disease 09/22/2022   History of DVT (deep vein thrombosis) 09/22/2022   Spastic hemiplegia of left nondominant side as late effect of cerebral infarction (HCC) 09/22/2022   Seizure disorder (HCC) 09/22/2022   BMI 27.0-27.9,adult 09/22/2022   Mild episode of recurrent major depressive disorder (HCC) 06/15/2022   Status post craniectomy 02/09/2022   Urinary retention 09/18/2021   Vitamin D deficiency 11/04/2020   Hyperlipidemia 03/28/2018   Diabetes mellitus without complication (HCC) 02/23/2018   Essential hypertension 02/23/2018   ONSET DATE: April 2023  REFERRING DIAG: I63.9 (ICD-10-CM) - Cerebral infarction, unspecified   THERAPY DIAG:   Muscle weakness (generalized)  Other lack of coordination  Rationale for Evaluation and Treatment: Rehabilitation  SUBJECTIVE:  SUBJECTIVE STATEMENT: Pt reports that he wants OT to focus on stretching his arm. (See below under tx/impression)  Pt accompanied by: self and significant other  PERTINENT HISTORY: Per chart, Jacob [name] is a 47 y.o. male with a history of prior CVA with decompressive craniectomy in April 2023.  He has a history of  right peroneal DVT on Eliquis.  He has left hemiparesis from stroke.   Per spouse, pt spent some time in [facility], then 2 months at [facility] (SNF). He presented for elective cranioplasty in Sept 2023.  Pt was admitted to rehab 02/14/2022 for inpatient therapies following cranioplasty, and participated in [facility] therapies until recently.      PRECAUTIONS: Fall  WEIGHT BEARING RESTRICTIONS: No  PAIN: 10/22/22: no pain reported today in L shoulder Are you having pain?  Yes: NPRS scale: 0 (rest), when stretching 2/10 Pain location: L hand, L shoulder sublux Pain description: achy Aggravating factors: stretching of the L hand, movement of the L shoulder  Relieving factors: lying down and gentle stretching   FALLS: Has patient fallen in last 6 months? Yes. Number of falls 1  LIVING ENVIRONMENT: Lives with: lives with their spouse and 31 y/o son, 1 cousin, and 1 friend Lives in: 2 level with master bedroom on the main level, pt resides only on 1st level Stairs: 1 step no rail to enter, pt does have small ramp Has following equipment at home: Hemi walker, Wheelchair (manual), Shower bench, bed side commode, Grab bars, Ramped entry, and platform walker (grab bar by the toilet but not in the shower.  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  PATIENT GOALS: Pt wants to get back to walking and being as indep as possible.   OBJECTIVE:   HAND DOMINANCE: Right  ADLs: Overall ADLs: HHA Mon-Fri 5 hours/day; spouse is pt's primary caregiver Transfers/ambulation related to ADLs: min A sit to stand pivot from wc using gait belt  Eating: assist to cut food  Grooming: min A to manage facial hair UB Dressing: max A LB Dressing: max-dep A Toileting: dep, wears adult briefs.  Spouse reports pt has just started transitioning to use of BSC over the toilet vs voiding in a brief Bathing: max A with sponge/bed bath (in the process of getting bathroom re-done to enable pt to take a shower) Tub Shower transfers: N/A at this time; pt states he doesn't trust himself with the glass doors. Equipment:  see above  IADLs: Shopping: dep Light housekeeping: dep Meal Prep: dep Community mobility: wc dep  Medication management: dep Financial management: dep Handwriting:  Pt is R hand dominant, NT  MOBILITY STATUS:  Pt reports he can amb with hemi-walker 10-20 ft with max A (per spouse) and someone following with a  wc behind him  POSTURE COMMENTS:  rounded shoulders, forward head, and L sided hemiplegia (L shoulder subluxation ~1 thumb breadth) Sitting balance:  able to sit edge of mat and tolerate moderate perturbation from all directions.  Pt can reach forward to ankles and return to midline without LOB.  FUNCTIONAL OUTCOME MEASURES: FOTO: 20; predicted 29 09/01/22: 19  UPPER EXTREMITY ROM:    Passive ROM Right Eval (A/PROMWNL) Left eval Left 09/01/22  Shoulder flexion  78 90  Shoulder abduction  60 68  Shoulder adduction     Shoulder extension     Shoulder internal rotation     Shoulder external rotation  -10 (elbow at side) -10  Elbow flexion     Elbow extension     Wrist flexion  70 80  Wrist extension  48 58  Wrist ulnar deviation     Wrist radial deviation     Wrist pronation  90 90  Wrist supination  60 82  (Blank rows = not tested)  09/01/22: L hand passively opens ~75% of full extension d/t PIP and MP tightness in all digits.  UPPER EXTREMITY MMT:     MMT Right eval Left eval Left  09/01/22  Shoulder flexion 5 0 1  Shoulder abduction 5 0 1  Shoulder adduction     Shoulder extension     Shoulder internal rotation     Shoulder external rotation     Middle trapezius     Lower trapezius     Elbow flexion 5 0 1  Elbow extension 5 0 1  Wrist flexion 5 0 1  Wrist extension 5 0 1  Wrist ulnar deviation     Wrist radial deviation     Wrist pronation  0 1  Wrist supination  0 1  (Blank rows = not tested)  HAND FUNCTION: Grip strength: Right: 95 lbs; Left: NT lbs, Lateral pinch: Right: 25 lbs, Left: NT lbs, and 3 point pinch: Right: 20 lbs, Left: NT lbs 09/01/22: L grip/pinch NT (unable)  COORDINATION: 9 Hole Peg test: Right: NT sec; Left: unable sec 09/01/22: L NT (unable)  SENSATION: LUE: Light touch: Impaired  Proprioception: Impaired   EDEMA: None  MUSCLE TONE: LUE: Severe and Hypertonic sublux 1 thumb breadth  COGNITION: Overall cognitive status:  Decreased  awareness of deficits  VISION: Subjective report: Pt reports decreased peripheral vision on the L Baseline vision: No visual deficits Visual history: N/A  VISION ASSESSMENT: Tracking/Visual pursuits: Decreased smoothness of eye movement to Left superior field, Decreased smoothness of eye movement to Left inferior field, and Requires cues, head turns, or add eye shifts to track Saccades: additional eye shifts occurred during testing and additional head turns occurred during testing Visual Fields: Left homonymous hemianopsia  PERCEPTION: Not tested, will assess in functional contexts with additional visits  PRAXIS: Impaired: Motor planning  OBSERVATIONS: L hand rests in flexion on lap tray, improved hand hygiene this date  TODAY'S TREATMENT:            Self Care: Focus today on LB dressing skills.  Used looped theraband to simulate waist of pants.  Pt practiced stepping in to "waist" of pants from wc level, requiring vc for positioning on 1 attempt, and min A on 2 attempts.  Pt able to hike band to knees from sitting, transitioned to standing with min guard and hemi walker, and was able to hike and lower pants above and below waist with min guard for balance, min vc for hiking over L hip.  Pt required cues for widening BOS with each standing episode, and working to position self with back of legs pressed against or close to wc in case of LOB in standing.  Reviewed bathroom set up at home and potential to add wc mobility goal for maximizing indep with taking self to bathroom.  Spouse drew basic layout of bedroom/bathroom/toilet so pt may practice wc mobility in the clinic to maximize indep in the home with this task.     PATIENT EDUCATION: Education details: LB dressing training/OT goals Person educated: Patient and Spouse Education method: explanation, demo, vc, min A Education comprehension: verbalized understanding, demonstrated understanding with min A, further training needed  HOME  EXERCISE PROGRAM: PROM throughout the LUE  GOALS: Goals reviewed with patient? Yes  SHORT TERM GOALS: Target date: 10/27/22 (6 weeks)  Pt/caregivers will demo indep with HEP for reducing pain and stiffness throughout the LUE. Baseline: Not yet initiated; 09/01/22: Pt performs self passive stretching daily throughout the LUE, but caregiver is  needed to meet end ranges and min vc for optimal technique.  Spouse reports pt has new caregivers and they are not yet performing ROM, but spouse plans to develop a schedule.  Goal status: ongoing  2.  Pt/caregivers will demo supportive positioning techniques with min vc to support L hemiparetic limb. Baseline: Educ not yet initiated; 09/01/22: Pt has just received his wc lap tray but he does not like it, per spouse.  Pt is inconsistent to use supportive positioning via other methods that have been recommended, including use of pillows to support L shoulder sublux.  Pt does tuck his L hand into gait belt while working with PT during mobility and spouse reports that pt positions L arm on arm rest of couch at home. Goal status: ongoing  3.  Pt/caregivers will follow L hand splinting schedule with at last 50% compliance in order to reduce contracture risk and to optimize skin integrity of L hand. Baseline: Not yet initiated; 09/01/22: <50% compliance; pt reports inconsistent wearing time and splint was not brought to therapy session today.  OT encouraged pt bring to each session for OT to assess need for splinting adjustments as needed.   Goal status: ongoing  LONG TERM GOALS: Target date: 12/08/22 (12 weeks)  Pt will increase FOTO score to 29 or more to indicate increased indep with daily tasks.   Baseline: Eval: 20 (predicted 29); 09/01/22: 19 Goal status: ongoing  2.  Pt will transfer wc<>BSC with min guard Baseline: min-mod A; 09/01/22 min A consistently, wc to couch or wc to bed with supv Goal status: ongoing  3.  Pt will don/doff shirt with min A Baseline:  Mod-max A; 09/01/22: supv to doff sweatshirt, min A to don Goal status: achieved  4.  Pt will manage clothing for toileting with min A Baseline: max A-dep; 08/31/12: mod A  Goal status: ongoing  5.  Pt will increase L shoulder strength by 2 MM grade to assist with repositioning L upper arm away from side for underarm care and donning shirt. Baseline: L shoulder 0/5; 09/01/22: L shoulder 1/5 (but must use R arm to reposition the L) Goal status: ongoing, (revised from 1 MM grade to 2 MM grades)  6.  Through manual therapy, neuro re-ed, and therapeutic exercises, pt will report 1/10 pain or less throughout the LUE to increase tolerance for engaging the LUE into ADLs.   Baseline: LUE 5/10 pain, 09/01/22: Pt reports 2/10 pain throughout the LUE Goal status: ongoing (revised from 3/10 pain or less to 1/10 pain or less)  7.  Pt will perform supine<>sit bed transfer with supv and AE as needed into his queen size bed. Baseline: Mod A into hospital bed; modified indep with bed rail and trapeze into hospital bed, but pt wants to transition to his queen size bed and has not yet attempted this transfer.  Spouse reports that they may plan to order a bed rail.   Goal status: ongoing  ASSESSMENT:  CLINICAL IMPRESSION: Focus on LB dressing training this date.  Pt required min guard-min A for stepping into looped theraband (simulating waist of pants) from seated position, and min guard to hike/lower over hips in standing.  Extensive time given between ADL training to discuss OT goals and combating self limiting behavior as pt showing poor motivation this date.  Explained to pt that even with his insurance change next month, therapy visits will be limited to 30 shared for the year.  Pt requests to devote 20 visits to  PT for mobility, and 10 visits to OT for ADLs.  Pt reported that he wants to focus on OT stretching the LUE.  OT educated pt that this is no longer a skilled therapy as pt and caregivers have been educated  on ROM/positioning strategies for the LUE.  Directed pt to focus on possible ADL goals, and pt was in agreement that continued focus on LB dressing would be beneficial, and spouse agreed that wc mobility would be a good focus to maximize pt's indep with getting himself to the toilet.  Pt admits that he often will just void in his brief, which OT advised against, as pt is capable of using commode with caregiver assist, and voiding in brief is not helping to promote indep.  Pt will continue to benefit from skilled OT to work towards improving functional transfers, wc mobility, positioning, LB dressing, and increasing attention to L side in order to maximize indep and safety with self care tasks.   PERFORMANCE DEFICITS: in functional skills including ADLs, IADLs, coordination, dexterity, proprioception, sensation, tone, ROM, strength, pain, flexibility, Fine motor control, Gross motor control, mobility, balance, body mechanics, decreased knowledge of use of DME, skin integrity, vision, and UE functional use, cognitive skills including perception and safety awareness, and psychosocial skills including coping strategies and routines and behaviors.   IMPAIRMENTS: are limiting patient from ADLs, IADLs, leisure, and social participation.   CO-MORBIDITIES: may have co-morbidities  that affects occupational performance. Patient will benefit from skilled OT to address above impairments and improve overall function.  MODIFICATION OR ASSISTANCE TO COMPLETE EVALUATION: No modification of tasks or assist necessary to complete an evaluation.  OT OCCUPATIONAL PROFILE AND HISTORY: Problem focused assessment: Including review of records relating to presenting problem.  CLINICAL DECISION MAKING: Moderate - several treatment options, min-mod task modification necessary  REHAB POTENTIAL: Good  EVALUATION COMPLEXITY: High    PLAN:  OT FREQUENCY: 2x/week  OT DURATION: 12 weeks  PLANNED INTERVENTIONS: self care/ADL  training, therapeutic exercise, therapeutic activity, neuromuscular re-education, manual therapy, passive range of motion, balance training, functional mobility training, splinting, electrical stimulation, moist heat, cryotherapy, patient/family education, cognitive remediation/compensation, visual/perceptual remediation/compensation, psychosocial skills training, coping strategies training, and DME and/or AE instructions  RECOMMENDED OTHER SERVICES: N/A  CONSULTED AND AGREED WITH PLAN OF CARE: Patient and family member/caregiver  PLAN FOR NEXT SESSION: LB dressing, wc mobility  Danelle Earthly, MS, OTR/L

## 2022-10-23 NOTE — Progress Notes (Signed)
   Chief Complaint  Patient presents with   Nail Problem    "Check his feet."    SUBJECTIVE Patient presents to office today complaining of elongated, thickened nails that cause pain while ambulating in shoes.  Patient is unable to trim their own nails. Patient is here for further evaluation and treatment.  Past Medical History:  Diagnosis Date   Allergy    Diabetes mellitus without complication (HCC)    GERD (gastroesophageal reflux disease)    Hypertension    Paralysis (HCC)    Seizures (HCC)    Stroke (HCC)     Allergies  Allergen Reactions   Hydrochlorothiazide Other (See Comments)    Bilateral muscle cramping     OBJECTIVE General Patient is awake, alert, and oriented x 3 and in no acute distress. Derm Skin is dry and supple bilateral. Negative open lesions or macerations. Remaining integument unremarkable. Nails are tender, long, thickened and dystrophic with subungual debris, consistent with onychomycosis, 1-5 bilateral. No signs of infection noted.  There is some pruritus with superficial fissuring of skin noted to the weightbearing surface of the bilateral feet.  Consistent with tinea pedis Vasc  DP and PT pedal pulses palpable. Temperature gradient within normal limits.  Moderate edema noted bilateral Neuro light touch and protective threshold sensation diminished Musculoskeletal Exam minimally ambulatory.  No gross pedal deformity noted.  Loss of muscle strength bilateral lower extremities  ASSESSMENT 1.  Pain due to onychomycosis of toenails both 2.  Tinea pedis both  PLAN OF CARE 1. Patient evaluated today.  2. Instructed to maintain good pedal hygiene and foot care.  3. Mechanical debridement of nails 1-5 bilaterally performed using a nail nipper. Filed with dremel without incident.  4.  Prescription for Lotrisone cream apply 2 times daily return to clinic in 3 mos.    Felecia Shelling, DPM [facility]  Dr. Felecia Shelling, DPM    [address] [place], [place] [address]                Office [phone]  Fax [phone]

## 2022-10-27 ENCOUNTER — Ambulatory Visit: Payer: BC Managed Care – PPO

## 2022-10-27 DIAGNOSIS — F3289 Other specified depressive episodes: Secondary | ICD-10-CM | POA: Diagnosis not present

## 2022-10-29 ENCOUNTER — Telehealth: Payer: BC Managed Care – PPO | Admitting: Internal Medicine

## 2022-10-29 DIAGNOSIS — R269 Unspecified abnormalities of gait and mobility: Secondary | ICD-10-CM

## 2022-10-29 NOTE — Progress Notes (Signed)
Office Visit  Subjective   Patient ID: Jacob [name]   DOB: 1976-02-03   Age: 47 y.o.   MRN: [id number]   Chief Complaint No chief complaint on file.    History of Present Illness The patient is a 47 yo male who calls in for a telehealth visit as he needs a visit for continued services for homehealth PT/OT/ST and home health aid.  He already has these services but he switched insurance and they are requesting a new referral.  Again, this patient unfortunately expericence a stroke last year where we took care of him at [facility] and he was eventually transferred to [facility] rehab from 02/14/2022 until 03/07/2022.  This patient is well known to me where I care for him at [facility] this past year where he had a CVA with decompressive craniectomy with cranioplasty with functional deficits.  He has residual left sided hemiparesis from this prior stroke.  He was admitted to inpatient rehab for PT/OT/ST.  They noted some left shoulder subluxation which was treated with taping and ROM and sling when not mobilizing.  He was started on baclofen 10mg  twice daily due to worsening left upper extremity and left lower extremity tone.  They discontinued his Ritalin during his stay as well.   They noted that the patient had improvement in his activity tolerance, balance, postural control and ability to compensate for deficits.  They discharged him home and he is currently getting homehealth therapies at this time.  At this time, he is still receiving PT and OT where he states he is using a walker.     Past Medical History Past Medical History:  Diagnosis Date   Allergy    Diabetes mellitus without complication (HCC)    GERD (gastroesophageal reflux disease)    Hypertension    Paralysis (HCC)    Seizures (HCC)    Stroke (HCC)      Allergies Allergies  Allergen Reactions   Hydrochlorothiazide Other (See Comments)    Bilateral muscle cramping     Medications  Current  Outpatient Medications:    amLODipine (NORVASC) 10 MG tablet, Take 1 tablet (10 mg total) by mouth daily., Disp: 90 tablet, Rfl: 1   atorvastatin (LIPITOR) 40 MG tablet, Take 2 tablets (80 mg total) by mouth at bedtime. (Patient taking differently: Take 40 mg by mouth at bedtime.), Disp: 90 tablet, Rfl: 1   baclofen (LIORESAL) 10 MG tablet, Take 1 tablet (10 mg total) by mouth 3 (three) times daily., Disp: 90 each, Rfl: 3   blood glucose meter kit and supplies KIT, Dispense based on patient and insurance preference. Use up to four times daily as directed., Disp: 1 each, Rfl: 0   Brivaracetam (BRIVIACT) 50 MG TABS, Take 50 mg by mouth 2 (two) times daily., Disp: 60 tablet, Rfl: 5   Brivaracetam (BRIVIACT) 50 MG TABS, Take 50 mg by mouth daily., Disp: 60 tablet, Rfl: 0   cloNIDine (CATAPRES - DOSED IN MG/24 HR) 0.3 mg/24hr patch, Place 1 patch (0.3 mg total) onto the skin once a week., Disp: 4 patch, Rfl: 6   clotrimazole-betamethasone (LOTRISONE) cream, Apply 1 Application topically daily., Disp: 30 g, Rfl: 0   ELIQUIS 5 MG TABS tablet, TAKE 1 TABLET BY MOUTH TWICE A DAY, Disp: 180 tablet, Rfl: 1   escitalopram (LEXAPRO) 20 MG tablet, TAKE 1 AND 1/2 TABLETS DAILY BY MOUTH, Disp: 45 tablet, Rfl: 2   glucose blood (RELION TRUE METRIX TEST STRIPS) test strip,  Use as instructed, Disp: 100 each, Rfl: 12   insulin glargine (LANTUS) 100 UNIT/ML injection, Inject 0.1 mLs (10 Units total) into the skin daily. (Patient taking differently: Inject 12 Units into the skin daily.), Disp: 10 mL, Rfl: 0   insulin lispro (HUMALOG KWIKPEN) 100 UNIT/ML KwikPen, INJECT 0-20 UNITS INTO THE SKIN 3 (THREE) TIMES DAILY. INJECT 0-20 UNITS INTO THE SKIN 3 (THREE) TIMES DAILY WITH MEALS. CBG < 70 CALL MD. CBG 70-120 GIVE 0 UNITS 121-150 GIVE 3 UNITS 151-200 GIVE 4 UNITS, 201-250 GIVE 7 UNITS 251-300 GIVE 11 UNITS 301-350 GIVE 15 UNITS 351-400 GIVE 20 UNITS GREATER THAN 400 CALL MD, Disp: 15 mL, Rfl: 1   Lancets Ultra Fine MISC,  Use as directed, Disp: 100 each, Rfl: 11   lisinopril (ZESTRIL) 40 MG tablet, Take 1 tablet (40 mg total) by mouth daily., Disp: 90 tablet, Rfl: 1   metFORMIN (GLUCOPHAGE) 500 MG tablet, Take 1 tablet (500 mg total) by mouth 2 (two) times daily with a meal., Disp: 180 tablet, Rfl: 1   metoprolol tartrate (LOPRESSOR) 25 MG tablet, Take 1 tablet (25 mg total) by mouth 2 (two) times daily., Disp: 60 tablet, Rfl: 11   sildenafil (VIAGRA) 50 MG tablet, Take 1 tablet (50 mg total) by mouth daily as needed for erectile dysfunction., Disp: 10 tablet, Rfl: 2   traZODone (DESYREL) 50 MG tablet, Take 1 tablet (50 mg total) by mouth at bedtime., Disp: 90 tablet, Rfl: 1   Review of Systems Review of Systems  Respiratory:  Negative for shortness of breath.   Cardiovascular:  Negative for chest pain, palpitations and leg swelling.  Gastrointestinal:  Negative for abdominal pain, constipation, diarrhea, nausea and vomiting.  Neurological:  Positive for focal weakness. Negative for dizziness and headaches.  Psychiatric/Behavioral:         He states his depression and anxiety are doing better.       Objective:    Vitals There were no vitals taken for this visit.   Physical Examination Physical Exam Constitutional:      Appearance: Normal appearance.  Psychiatric:        Mood and Affect: Mood normal.        Assessment & Plan:   Gait abnormality He has residual effects from his stroke.  We will refer him for homehealth evaluation.  He also wants referral for a driving evaluation at [facility] as he wants to try to get back to driving.    No follow-ups on file.   [name], MD

## 2022-10-29 NOTE — Assessment & Plan Note (Addendum)
He has residual effects from his stroke.  We will refer him for homehealth evaluation.  He also wants referral for a driving evaluation at [facility] as he wants to try to get back to driving.  He is not driving and is not cleared to drive. Per his wife the neurologist said they cannot even evaluate him for driving for 6 months due to seizure.

## 2022-11-03 ENCOUNTER — Ambulatory Visit: Payer: BC Managed Care – PPO | Admitting: Physical Therapy

## 2022-11-03 ENCOUNTER — Ambulatory Visit: Payer: BC Managed Care – PPO | Attending: Internal Medicine

## 2022-11-03 DIAGNOSIS — F3289 Other specified depressive episodes: Secondary | ICD-10-CM | POA: Diagnosis not present

## 2022-11-03 NOTE — Telephone Encounter (Signed)
Communicated with pt's spouse via text this a.m. Spouse reports pt was supposed to phone spouse if his ride did not show up. Spouse confirmed pt's son did not show up this a.m. & pt did not notify spouse. Spouse apologized for the missed communication.   Danelle Earthly, MS, OTR/L

## 2022-11-07 ENCOUNTER — Other Ambulatory Visit: Payer: Self-pay | Admitting: Neurology

## 2022-11-09 DIAGNOSIS — E119 Type 2 diabetes mellitus without complications: Secondary | ICD-10-CM | POA: Diagnosis not present

## 2022-11-09 DIAGNOSIS — Z7984 Long term (current) use of oral hypoglycemic drugs: Secondary | ICD-10-CM | POA: Diagnosis not present

## 2022-11-09 DIAGNOSIS — I10 Essential (primary) hypertension: Secondary | ICD-10-CM | POA: Diagnosis not present

## 2022-11-09 DIAGNOSIS — Z7901 Long term (current) use of anticoagulants: Secondary | ICD-10-CM | POA: Diagnosis not present

## 2022-11-09 DIAGNOSIS — I69354 Hemiplegia and hemiparesis following cerebral infarction affecting left non-dominant side: Secondary | ICD-10-CM | POA: Diagnosis not present

## 2022-11-09 DIAGNOSIS — Z794 Long term (current) use of insulin: Secondary | ICD-10-CM | POA: Diagnosis not present

## 2022-11-09 NOTE — Telephone Encounter (Signed)
Requested Prescriptions   Pending Prescriptions Disp Refills   BRIVIACT 50 MG TABS [Pharmacy Med Name: BRIVIACT 50 MG TABLET] 60 tablet 5    Sig: TAKE 1 TABLET (50 MG) BY MOUTH 2 (TWO) TIMES DAILY.   Last seen 10/01/22, next appt 04/07/23  Dispenses   Dispensed Days Supply Quantity Provider Pharmacy  BRIVIACT 50 MG TABLET 10/02/2022 30 60 each Windell Norfolk, MD [facility] [phone]     LOOKS LIKE PT REQUESTED REMOVAL:

## 2022-11-10 ENCOUNTER — Ambulatory Visit: Payer: BC Managed Care – PPO | Admitting: Physical Therapy

## 2022-11-10 ENCOUNTER — Ambulatory Visit: Payer: BC Managed Care – PPO

## 2022-11-10 DIAGNOSIS — F3289 Other specified depressive episodes: Secondary | ICD-10-CM | POA: Diagnosis not present

## 2022-11-15 ENCOUNTER — Other Ambulatory Visit: Payer: Self-pay | Admitting: Internal Medicine

## 2022-11-17 ENCOUNTER — Ambulatory Visit: Payer: BC Managed Care – PPO | Admitting: Occupational Therapy

## 2022-11-17 ENCOUNTER — Ambulatory Visit: Payer: BC Managed Care – PPO | Admitting: Physical Therapy

## 2022-11-18 ENCOUNTER — Encounter: Payer: Self-pay | Admitting: Neurology

## 2022-11-18 ENCOUNTER — Ambulatory Visit: Payer: BC Managed Care – PPO | Admitting: Neurology

## 2022-11-18 VITALS — BP 113/77 | HR 70 | Ht 73.0 in

## 2022-11-18 DIAGNOSIS — H53462 Homonymous bilateral field defects, left side: Secondary | ICD-10-CM | POA: Diagnosis not present

## 2022-11-18 DIAGNOSIS — G40209 Localization-related (focal) (partial) symptomatic epilepsy and epileptic syndromes with complex partial seizures, not intractable, without status epilepticus: Secondary | ICD-10-CM

## 2022-11-18 DIAGNOSIS — I69354 Hemiplegia and hemiparesis following cerebral infarction affecting left non-dominant side: Secondary | ICD-10-CM

## 2022-11-18 DIAGNOSIS — Z8673 Personal history of transient ischemic attack (TIA), and cerebral infarction without residual deficits: Secondary | ICD-10-CM | POA: Diagnosis not present

## 2022-11-18 DIAGNOSIS — I82413 Acute embolism and thrombosis of femoral vein, bilateral: Secondary | ICD-10-CM | POA: Diagnosis not present

## 2022-11-18 DIAGNOSIS — F3289 Other specified depressive episodes: Secondary | ICD-10-CM | POA: Diagnosis not present

## 2022-11-18 MED ORDER — ASPIRIN 81 MG PO TBEC
81.0000 mg | DELAYED_RELEASE_TABLET | Freq: Every day | ORAL | 12 refills | Status: DC
Start: 2022-11-18 — End: 2024-02-07

## 2022-11-18 NOTE — Patient Instructions (Addendum)
I had a long d/w patient and his wife about his remote stroke, spastic left hemiplegia, hemicraniectomy , h/o DVT,risk for recurrent stroke/TIAs, personally independently reviewed imaging studies and stroke evaluation results and answered questions.Disontinue Eliquis (apixaban) now and switch to aspirin 81 mg daily for stroke prevention lower extremity venous Doppler studies in January 2024 negative for DVT And maintain strict control of hypertension with blood pressure goal below 130/90, diabetes with hemoglobin A1c goal below 6.5% and lipids with LDL cholesterol goal below 70 mg/dL. I also advised the patient to eat a healthy diet with plenty of whole grains, cereals, fruits and vegetables, exercise regularly and maintain ideal body weight  .   Followup in the future with my nurse practitioner in 6 months or call earlier if necessary.

## 2022-11-18 NOTE — Progress Notes (Addendum)
[facility] [address] [place]. [place] [address] [phone]       OFFICE FOLLOW-UP NOTE  [name] Date of Birth:  June 25, 1975 Medical Record Number:  [id number]   HPI: 05/06/2022 Jacob [name] is a 47 year old Caucasian male seen today for initial office follow-up visit following hospital admission for stroke and April 2023.  He is accompanied by his wife.  History is obtained from them and review of electronic medical records.  I have personally reviewed pertinent available imaging films in PACS.Jacob [name] is a 47 y.o. male with a history of htn and DM who presents with left sided facial droop. Initially he had arm symptoms as well. He was normal at 1:30 am, then started having symptoms and woke his wife up. He described the symptoms as being that he was holding his phone, then suddenly could tnot tell he was holding his phone any more. He woke up  his wife who called 911. EMS noted some arm weakness and severe left facial droop.  He was brought in as a code stroke, and had significant improvement en route.   On initial examination, his arm symptoms had almost completely resolved(mild slowness on sequential opposition of digits, but no drift or ataxia). He still had mild dysarthria and facial weakness, but nothing that appeared disabling. TNK was discussed, but given the mildness of symptoms was not pursued. Over the the next few minutes, it was noted that he had some fluctuation of his symptoms, from mild to severe to mild dysarthria. CTA showed R M1 occlusion. In the setting of fluctuating symptoms which were disabling at times, TNK was re-discussed and decision was made to pursue this. There was delay after this due to elevated blood pressures which needed to be treated.  Right MCA was revascularized with TICI 3 flow established.  Unfortunately the patient yet developed a large right hemispheric infarct and significant cytotoxic cerebral edema postprocedure was taken  for emergent decompressive hemicraniectomy by Dr. Lovell Sheehan with placement of the bone flap in the abdomen.  Left intubated for several days required tracheostomy to manage respiratory failure.  He developed DVT in the right peroneal vein for started on Eliquis for this..  2D echo showed ejection fraction greater than 75%.  TEE showed small intrapulmonary shunt.  Hemoglobin A1c was 8.1.  LDL cholesterol is 96 mg percent.  Was transferred to [facility] and is currently living at home.  Getting home physical and occupational therapy 1 day a week.  He still has significant left hemiplegia function on the left.  He is able to stand with help of the therapist but not able to walk yet.  He can hold onto the walker taking maybe 1 or 2 steps.  He is tolerating Eliquis well without bruising or bleeding.  He has not yet had any follow-up venous Doppler studies.  To see if his DVT has resolved.  Since had his bone flap replaced on 02/09/2022 by Dr. Lovell Sheehan follow-up CT scan of the head on 02/12/2022 showed stable appearance of his right MCA and PCA infarcts without acute abnormality. Update  11/18/2022 : He returns for follow-up after last visit with me 6 months ago.  He was seen in the ER on 09/16/2022 with focal seizure on Keppra 100 twice daily.  Patient had trouble tolerating it is.  He was seen in the office by Dr. Teresa Coombs on 10/01/2022 and switch to Briviact 50 mg twice daily which she seems to be tolerating well without side effects.  EEG done showed focal right hemispheric slowing but no seizures.  He continues to have spastic left hemiplegia and is currently working with physical and Occupational Therapy at home.  He is barely able to stand and walks without hemiwalker with a 1 person assist.  He can help with transfers.   He has some strength in his left shoulder and elbow but not in his hand or fingers.  He is able to lift his left leg against gravity but unable to bear weight.Marland Kitchen  He is still on Eliquis for lower extremity DVT but  lower extremity Doppler study in January 2024 had shown resolution of DVT with no clot noted His blood pressure and sugars have been under good control.  His primary physician had increased his dose of levothyroxine last lipid profile on 09/05/2022 was satisfactory with LDL cholesterol 70 and hemoglobin A1c 7.0.  ROS:   14 system review of systems is positive for weakness, difficulty walking, , Tiredness and all other systems negative  PMH:  Past Medical History:  Diagnosis Date   Allergy    Diabetes mellitus without complication (HCC)    GERD (gastroesophageal reflux disease)    Hypertension    Paralysis (HCC)    Seizures (HCC)    Stroke [facility])     Social History:  Social History   Socioeconomic History   Marital status: Married    Spouse name: Not on file   Number of children: Not on file   Years of education: Not on file   Highest education level: Not on file  Occupational History   Not on file  Tobacco Use   Smoking status: Never   Smokeless tobacco: Never  Vaping Use   Vaping Use: Never used  Substance and Sexual Activity   Alcohol use: Yes    Alcohol/week: 1.0 standard drink of alcohol    Types: 1 Cans of beer per week    Comment: OCCASIONALLY   Drug use: No   Sexual activity: Not on file  Other Topics Concern   Not on file  Social History Narrative   Lives at home with wife and son 35yr old   Right handed   Caffeine-8oz   Social Determinants of Health   Financial Resource Strain: Low Risk  (02/05/2022)   Overall Financial Resource Strain (CARDIA)    Difficulty of Paying Living Expenses: Not very hard  Food Insecurity: No Food Insecurity (03/18/2022)   Hunger Vital Sign    Worried About Running Out of Food in the Last Year: Never true    Ran Out of Food in the Last Year: Never true  Transportation Needs: No Transportation Needs (03/18/2022)   PRAPARE - Administrator, Civil Service (Medical): No    Lack of Transportation (Non-Medical): No   Physical Activity: Not on file  Stress: Not on file  Social Connections: Not on file  Intimate Partner Violence: Not on file    Medications:   Current Outpatient Medications on File Prior to Visit  Medication Sig Dispense Refill   amLODipine (NORVASC) 10 MG tablet TAKE 1 TABLET BY MOUTH EVERY DAY 90 tablet 1   atorvastatin (LIPITOR) 40 MG tablet Take 2 tablets (80 mg total) by mouth at bedtime. (Patient taking differently: Take 40 mg by mouth at bedtime.) 90 tablet 1   baclofen (LIORESAL) 10 MG tablet TAKE 1 TABLET BY MOUTH THREE TIMES A DAY 90 tablet 3   blood glucose meter kit and supplies KIT Dispense based on patient and insurance  preference. Use up to four times daily as directed. 1 each 0   BRIVIACT 50 MG TABS TAKE 1 TABLET (50 MG) BY MOUTH 2 (TWO) TIMES DAILY. 60 tablet 5   cloNIDine (CATAPRES - DOSED IN MG/24 HR) 0.3 mg/24hr patch Place 1 patch (0.3 mg total) onto the skin once a week. 4 patch 6   clotrimazole-betamethasone (LOTRISONE) cream Apply 1 Application topically daily. 30 g 0   ELIQUIS 5 MG TABS tablet TAKE 1 TABLET BY MOUTH TWICE A DAY 180 tablet 1   escitalopram (LEXAPRO) 20 MG tablet TAKE 1 AND 1/2 TABLETS DAILY BY MOUTH 45 tablet 2   glucose blood (RELION TRUE METRIX TEST STRIPS) test strip Use as instructed 100 each 12   insulin glargine (LANTUS) 100 UNIT/ML injection Inject 0.1 mLs (10 Units total) into the skin daily. (Patient taking differently: Inject 12 Units into the skin daily.) 10 mL 0   insulin lispro (HUMALOG KWIKPEN) 100 UNIT/ML KwikPen INJECT 0-20 UNITS INTO THE SKIN 3 (THREE) TIMES DAILY. INJECT 0-20 UNITS INTO THE SKIN 3 (THREE) TIMES DAILY WITH MEALS. CBG < 70 CALL MD. CBG 70-120 GIVE 0 UNITS 121-150 GIVE 3 UNITS 151-200 GIVE 4 UNITS, 201-250 GIVE 7 UNITS 251-300 GIVE 11 UNITS 301-350 GIVE 15 UNITS 351-400 GIVE 20 UNITS GREATER THAN 400 CALL MD 15 mL 1   Lancets Ultra Fine MISC Use as directed 100 each 11   lisinopril (ZESTRIL) 40 MG tablet TAKE 1 TABLET  BY MOUTH EVERY DAY 90 tablet 1   metFORMIN (GLUCOPHAGE) 500 MG tablet Take 1 tablet (500 mg total) by mouth 2 (two) times daily with a meal. 180 tablet 1   metoprolol tartrate (LOPRESSOR) 25 MG tablet Take 1 tablet (25 mg total) by mouth 2 (two) times daily. 60 tablet 11   sildenafil (VIAGRA) 50 MG tablet Take 1 tablet (50 mg total) by mouth daily as needed for erectile dysfunction. 10 tablet 2   traZODone (DESYREL) 50 MG tablet Take 1 tablet (50 mg total) by mouth at bedtime. 90 tablet 1   No current facility-administered medications on file prior to visit.    Allergies:   Allergies  Allergen Reactions   Hydrochlorothiazide Other (See Comments)    Bilateral muscle cramping    Physical Exam General: Frail middle-aged African-American male, seated, in no evident distress Head: head normocephalic and atraumatic.  Neck: supple with no carotid or supraclavicular bruits Cardiovascular: regular rate and rhythm, no murmurs Musculoskeletal: no deformity large surgical scar on the scalp from right hemicraniectomy Skin:  no rash/petichiae Vascular:  Normal pulses all extremities Vitals:   11/18/22 1406  BP: 113/77  Pulse: 70   Neurologic Exam Mental Status: Awake and fully alert. Oriented to place and time. Recent and remote memory intact. Attention span, concentration and fund of knowledge appropriate. Mood and affect appropriate.  Cranial Nerves: Fundoscopic exam reveals sharp disc margins. Pupils equal, briskly reactive to light. Extraocular movements full without nystagmus. Visual fields show dense left homonymous hemianopsia to confrontation. Hearing intact. Facial sensation intact.  Left lower face weakness., tongue, palate moves normally and symmetrically.  Motor: Spastic left hemiplegia with left upper extremity 0/5 in left lower extremity 1/5 strength with left foot drop.  Tone is increased on the left with spasticity.. Sensory.: intact to touch ,pinprick .position and vibratory  sensation.  Coordination: Normal on the right and unable to test on the left.   Gait and Station: Unable to test as patient is nonambulatory  reflexes: 1+ and asymmetric and  brisker on the left. Toes downgoing.   NIHSS  11 Modified Rankin  4     11/18/2022    2:21 PM  MMSE - Mini Mental State Exam  Orientation to time 4  Orientation to Place 5  Registration 3  Attention/ Calculation 5  Recall 3  Language- name 2 objects 2  Language- repeat 1  Language- follow 3 step command 3  Language- read & follow direction 1  Write a sentence 1  Copy design 0  Total score 28    ASSESSMENT: 47 year old African-American male with large right MCA infarct secondary to right MCA occlusion s/p IV thrombolysis with TNK followed by mechanical thrombectomy with successful revascularization with patient developing malignant cytotoxic edema requiring emergent hemicraniectomy but has significant residual spastic left hemiplegia.  Stroke etiology cryptogenic versus intracranial stenosis.  Vascular risk factors of diabetes, hypertension, hyperlipidemia atherosclerosis. He had symptomatic seizures on 09/16/2022 and was initially started on Keppra but switched to Briviact due to side effects    PLAN:I had a long d/w patient and his wife about his remote stroke, spastic left hemiplegia, hemicraniectomy , h/o DVT,risk for recurrent stroke/TIAs, personally independently reviewed imaging studies and stroke evaluation results and answered questions.Disontinue Eliquis (apixaban) now and switch to aspirin 81 mg daily for stroke prevention lower extremity venous Doppler studies in January 2024 negative for DVT And maintain strict control of hypertension with blood pressure goal below 130/90, diabetes with hemoglobin A1c goal below 6.5% and lipids with LDL cholesterol goal below 70 mg/dL. I also advised the patient to eat a healthy diet with plenty of whole grains, cereals, fruits and vegetables, exercise regularly and  maintain ideal body weight  .  Continue Briviact 50 mg twice daily for seizure refer to Dr. Terrace Arabia for Botox for spasticity.   Followup in the future with my nurse practitioner in 6 months or call earlier if necessary. Greater than 50% of time during this   45 minute  visit was spent on counseling,explanation of diagnosis, planning of further management, discussion with patient and family and coordination of care  Delia Heady, MD  Note: This document was prepared with digital dictation and possible smart phrase technology. Any transcriptional errors that result from this process are unintentional

## 2022-11-18 NOTE — Addendum Note (Signed)
Addended by: Gates Rigg on: 11/18/2022 03:02 PM   Modules accepted: Level of Service

## 2022-11-19 DIAGNOSIS — E119 Type 2 diabetes mellitus without complications: Secondary | ICD-10-CM | POA: Diagnosis not present

## 2022-11-19 DIAGNOSIS — I69354 Hemiplegia and hemiparesis following cerebral infarction affecting left non-dominant side: Secondary | ICD-10-CM | POA: Diagnosis not present

## 2022-11-19 DIAGNOSIS — Z7984 Long term (current) use of oral hypoglycemic drugs: Secondary | ICD-10-CM | POA: Diagnosis not present

## 2022-11-19 DIAGNOSIS — Z794 Long term (current) use of insulin: Secondary | ICD-10-CM | POA: Diagnosis not present

## 2022-11-19 DIAGNOSIS — I10 Essential (primary) hypertension: Secondary | ICD-10-CM | POA: Diagnosis not present

## 2022-11-19 DIAGNOSIS — Z7901 Long term (current) use of anticoagulants: Secondary | ICD-10-CM | POA: Diagnosis not present

## 2022-11-20 DIAGNOSIS — Z794 Long term (current) use of insulin: Secondary | ICD-10-CM | POA: Diagnosis not present

## 2022-11-20 DIAGNOSIS — I10 Essential (primary) hypertension: Secondary | ICD-10-CM | POA: Diagnosis not present

## 2022-11-20 DIAGNOSIS — Z7984 Long term (current) use of oral hypoglycemic drugs: Secondary | ICD-10-CM | POA: Diagnosis not present

## 2022-11-20 DIAGNOSIS — I69354 Hemiplegia and hemiparesis following cerebral infarction affecting left non-dominant side: Secondary | ICD-10-CM | POA: Diagnosis not present

## 2022-11-20 DIAGNOSIS — E119 Type 2 diabetes mellitus without complications: Secondary | ICD-10-CM | POA: Diagnosis not present

## 2022-11-20 DIAGNOSIS — Z7901 Long term (current) use of anticoagulants: Secondary | ICD-10-CM | POA: Diagnosis not present

## 2022-11-24 ENCOUNTER — Ambulatory Visit: Payer: BC Managed Care – PPO

## 2022-11-24 ENCOUNTER — Ambulatory Visit: Payer: BC Managed Care – PPO | Admitting: Physical Therapy

## 2022-11-25 DIAGNOSIS — Z7984 Long term (current) use of oral hypoglycemic drugs: Secondary | ICD-10-CM | POA: Diagnosis not present

## 2022-11-25 DIAGNOSIS — I69354 Hemiplegia and hemiparesis following cerebral infarction affecting left non-dominant side: Secondary | ICD-10-CM | POA: Diagnosis not present

## 2022-11-25 DIAGNOSIS — E119 Type 2 diabetes mellitus without complications: Secondary | ICD-10-CM | POA: Diagnosis not present

## 2022-11-25 DIAGNOSIS — Z794 Long term (current) use of insulin: Secondary | ICD-10-CM | POA: Diagnosis not present

## 2022-11-25 DIAGNOSIS — Z7901 Long term (current) use of anticoagulants: Secondary | ICD-10-CM | POA: Diagnosis not present

## 2022-11-25 DIAGNOSIS — I10 Essential (primary) hypertension: Secondary | ICD-10-CM | POA: Diagnosis not present

## 2022-12-01 ENCOUNTER — Ambulatory Visit: Payer: BC Managed Care – PPO | Admitting: Physical Therapy

## 2022-12-01 ENCOUNTER — Ambulatory Visit: Payer: BC Managed Care – PPO

## 2022-12-01 DIAGNOSIS — F3289 Other specified depressive episodes: Secondary | ICD-10-CM | POA: Diagnosis not present

## 2022-12-04 DIAGNOSIS — I69354 Hemiplegia and hemiparesis following cerebral infarction affecting left non-dominant side: Secondary | ICD-10-CM | POA: Diagnosis not present

## 2022-12-04 DIAGNOSIS — Z794 Long term (current) use of insulin: Secondary | ICD-10-CM | POA: Diagnosis not present

## 2022-12-04 DIAGNOSIS — E119 Type 2 diabetes mellitus without complications: Secondary | ICD-10-CM | POA: Diagnosis not present

## 2022-12-04 DIAGNOSIS — Z7984 Long term (current) use of oral hypoglycemic drugs: Secondary | ICD-10-CM | POA: Diagnosis not present

## 2022-12-04 DIAGNOSIS — I10 Essential (primary) hypertension: Secondary | ICD-10-CM | POA: Diagnosis not present

## 2022-12-04 DIAGNOSIS — Z7901 Long term (current) use of anticoagulants: Secondary | ICD-10-CM | POA: Diagnosis not present

## 2022-12-08 ENCOUNTER — Ambulatory Visit: Payer: BC Managed Care – PPO | Admitting: Physical Therapy

## 2022-12-08 DIAGNOSIS — F3289 Other specified depressive episodes: Secondary | ICD-10-CM | POA: Diagnosis not present

## 2022-12-13 ENCOUNTER — Other Ambulatory Visit: Payer: Self-pay | Admitting: Internal Medicine

## 2022-12-13 ENCOUNTER — Other Ambulatory Visit: Payer: Self-pay | Admitting: Podiatry

## 2022-12-14 DIAGNOSIS — E119 Type 2 diabetes mellitus without complications: Secondary | ICD-10-CM | POA: Diagnosis not present

## 2022-12-14 DIAGNOSIS — Z794 Long term (current) use of insulin: Secondary | ICD-10-CM | POA: Diagnosis not present

## 2022-12-14 DIAGNOSIS — Z7984 Long term (current) use of oral hypoglycemic drugs: Secondary | ICD-10-CM | POA: Diagnosis not present

## 2022-12-14 DIAGNOSIS — Z7901 Long term (current) use of anticoagulants: Secondary | ICD-10-CM | POA: Diagnosis not present

## 2022-12-14 DIAGNOSIS — I10 Essential (primary) hypertension: Secondary | ICD-10-CM | POA: Diagnosis not present

## 2022-12-14 DIAGNOSIS — I69354 Hemiplegia and hemiparesis following cerebral infarction affecting left non-dominant side: Secondary | ICD-10-CM | POA: Diagnosis not present

## 2022-12-15 ENCOUNTER — Ambulatory Visit: Payer: BC Managed Care – PPO | Admitting: Physical Therapy

## 2022-12-19 ENCOUNTER — Other Ambulatory Visit: Payer: Self-pay | Admitting: Internal Medicine

## 2022-12-21 ENCOUNTER — Ambulatory Visit: Payer: BC Managed Care – PPO | Admitting: Internal Medicine

## 2022-12-22 ENCOUNTER — Ambulatory Visit: Payer: BC Managed Care – PPO | Admitting: Physical Therapy

## 2022-12-25 DIAGNOSIS — Z794 Long term (current) use of insulin: Secondary | ICD-10-CM | POA: Diagnosis not present

## 2022-12-25 DIAGNOSIS — Z7901 Long term (current) use of anticoagulants: Secondary | ICD-10-CM | POA: Diagnosis not present

## 2022-12-25 DIAGNOSIS — Z7984 Long term (current) use of oral hypoglycemic drugs: Secondary | ICD-10-CM | POA: Diagnosis not present

## 2022-12-25 DIAGNOSIS — I69354 Hemiplegia and hemiparesis following cerebral infarction affecting left non-dominant side: Secondary | ICD-10-CM | POA: Diagnosis not present

## 2022-12-25 DIAGNOSIS — I10 Essential (primary) hypertension: Secondary | ICD-10-CM | POA: Diagnosis not present

## 2022-12-25 DIAGNOSIS — E119 Type 2 diabetes mellitus without complications: Secondary | ICD-10-CM | POA: Diagnosis not present

## 2022-12-29 ENCOUNTER — Ambulatory Visit: Payer: BC Managed Care – PPO | Admitting: Physical Therapy

## 2022-12-30 DIAGNOSIS — I69354 Hemiplegia and hemiparesis following cerebral infarction affecting left non-dominant side: Secondary | ICD-10-CM | POA: Diagnosis not present

## 2022-12-30 DIAGNOSIS — E119 Type 2 diabetes mellitus without complications: Secondary | ICD-10-CM | POA: Diagnosis not present

## 2022-12-30 DIAGNOSIS — Z7984 Long term (current) use of oral hypoglycemic drugs: Secondary | ICD-10-CM | POA: Diagnosis not present

## 2022-12-30 DIAGNOSIS — Z794 Long term (current) use of insulin: Secondary | ICD-10-CM | POA: Diagnosis not present

## 2022-12-30 DIAGNOSIS — I10 Essential (primary) hypertension: Secondary | ICD-10-CM | POA: Diagnosis not present

## 2022-12-30 DIAGNOSIS — Z7901 Long term (current) use of anticoagulants: Secondary | ICD-10-CM | POA: Diagnosis not present

## 2023-01-05 ENCOUNTER — Ambulatory Visit: Payer: BC Managed Care – PPO | Admitting: Physical Therapy

## 2023-01-05 DIAGNOSIS — I69354 Hemiplegia and hemiparesis following cerebral infarction affecting left non-dominant side: Secondary | ICD-10-CM | POA: Diagnosis not present

## 2023-01-05 DIAGNOSIS — I10 Essential (primary) hypertension: Secondary | ICD-10-CM | POA: Diagnosis not present

## 2023-01-05 DIAGNOSIS — E119 Type 2 diabetes mellitus without complications: Secondary | ICD-10-CM | POA: Diagnosis not present

## 2023-01-05 DIAGNOSIS — F3289 Other specified depressive episodes: Secondary | ICD-10-CM | POA: Diagnosis not present

## 2023-01-05 DIAGNOSIS — Z7984 Long term (current) use of oral hypoglycemic drugs: Secondary | ICD-10-CM | POA: Diagnosis not present

## 2023-01-05 DIAGNOSIS — Z794 Long term (current) use of insulin: Secondary | ICD-10-CM | POA: Diagnosis not present

## 2023-01-05 DIAGNOSIS — Z7901 Long term (current) use of anticoagulants: Secondary | ICD-10-CM | POA: Diagnosis not present

## 2023-01-07 DIAGNOSIS — I10 Essential (primary) hypertension: Secondary | ICD-10-CM | POA: Diagnosis not present

## 2023-01-07 DIAGNOSIS — I69354 Hemiplegia and hemiparesis following cerebral infarction affecting left non-dominant side: Secondary | ICD-10-CM | POA: Diagnosis not present

## 2023-01-07 DIAGNOSIS — Z794 Long term (current) use of insulin: Secondary | ICD-10-CM | POA: Diagnosis not present

## 2023-01-07 DIAGNOSIS — Z7901 Long term (current) use of anticoagulants: Secondary | ICD-10-CM | POA: Diagnosis not present

## 2023-01-07 DIAGNOSIS — Z7984 Long term (current) use of oral hypoglycemic drugs: Secondary | ICD-10-CM | POA: Diagnosis not present

## 2023-01-07 DIAGNOSIS — E119 Type 2 diabetes mellitus without complications: Secondary | ICD-10-CM | POA: Diagnosis not present

## 2023-01-08 ENCOUNTER — Other Ambulatory Visit: Payer: Self-pay | Admitting: Internal Medicine

## 2023-01-12 ENCOUNTER — Ambulatory Visit: Payer: BC Managed Care – PPO | Admitting: Physical Therapy

## 2023-01-13 ENCOUNTER — Ambulatory Visit: Payer: BC Managed Care – PPO | Admitting: Internal Medicine

## 2023-01-15 DIAGNOSIS — E119 Type 2 diabetes mellitus without complications: Secondary | ICD-10-CM | POA: Diagnosis not present

## 2023-01-15 DIAGNOSIS — Z7984 Long term (current) use of oral hypoglycemic drugs: Secondary | ICD-10-CM | POA: Diagnosis not present

## 2023-01-15 DIAGNOSIS — I10 Essential (primary) hypertension: Secondary | ICD-10-CM | POA: Diagnosis not present

## 2023-01-15 DIAGNOSIS — Z7901 Long term (current) use of anticoagulants: Secondary | ICD-10-CM | POA: Diagnosis not present

## 2023-01-15 DIAGNOSIS — Z993 Dependence on wheelchair: Secondary | ICD-10-CM | POA: Diagnosis not present

## 2023-01-15 DIAGNOSIS — I69354 Hemiplegia and hemiparesis following cerebral infarction affecting left non-dominant side: Secondary | ICD-10-CM | POA: Diagnosis not present

## 2023-01-15 DIAGNOSIS — Z794 Long term (current) use of insulin: Secondary | ICD-10-CM | POA: Diagnosis not present

## 2023-01-19 ENCOUNTER — Ambulatory Visit: Payer: Medicaid Other | Admitting: Physical Therapy

## 2023-01-20 DIAGNOSIS — F3289 Other specified depressive episodes: Secondary | ICD-10-CM | POA: Diagnosis not present

## 2023-01-21 ENCOUNTER — Ambulatory Visit: Payer: BC Managed Care – PPO | Admitting: Internal Medicine

## 2023-01-21 ENCOUNTER — Encounter: Payer: Self-pay | Admitting: Internal Medicine

## 2023-01-21 VITALS — BP 116/74 | HR 60 | Temp 98.1°F | Resp 18 | Ht 73.0 in | Wt 189.0 lb

## 2023-01-21 DIAGNOSIS — I679 Cerebrovascular disease, unspecified: Secondary | ICD-10-CM

## 2023-01-21 DIAGNOSIS — I10 Essential (primary) hypertension: Secondary | ICD-10-CM | POA: Diagnosis not present

## 2023-01-21 DIAGNOSIS — E119 Type 2 diabetes mellitus without complications: Secondary | ICD-10-CM | POA: Insufficient documentation

## 2023-01-21 DIAGNOSIS — Z794 Long term (current) use of insulin: Secondary | ICD-10-CM | POA: Diagnosis not present

## 2023-01-21 DIAGNOSIS — E7800 Pure hypercholesterolemia, unspecified: Secondary | ICD-10-CM | POA: Diagnosis not present

## 2023-01-21 DIAGNOSIS — R339 Retention of urine, unspecified: Secondary | ICD-10-CM | POA: Diagnosis not present

## 2023-01-21 DIAGNOSIS — E782 Mixed hyperlipidemia: Secondary | ICD-10-CM | POA: Diagnosis not present

## 2023-01-21 DIAGNOSIS — E1159 Type 2 diabetes mellitus with other circulatory complications: Secondary | ICD-10-CM

## 2023-01-21 DIAGNOSIS — G40909 Epilepsy, unspecified, not intractable, without status epilepticus: Secondary | ICD-10-CM

## 2023-01-21 DIAGNOSIS — I69354 Hemiplegia and hemiparesis following cerebral infarction affecting left non-dominant side: Secondary | ICD-10-CM

## 2023-01-21 MED ORDER — CLONIDINE 0.3 MG/24HR TD PTWK
0.3000 mg | MEDICATED_PATCH | TRANSDERMAL | 1 refills | Status: DC
Start: 2023-01-21 — End: 2023-06-02

## 2023-01-21 NOTE — Assessment & Plan Note (Signed)
His BP is currently controlled.  We will continue his current medications.

## 2023-01-21 NOTE — Assessment & Plan Note (Signed)
We will continue with risk factor modification with control of his BP, DM and HCL.  He is being followed by neurology.  He is no longer on elquis but is on an ASA 81mg  daily.

## 2023-01-21 NOTE — Assessment & Plan Note (Signed)
His seizure meds were changed by neurology and he had his EEG as described above.  He remains seizure free.  I had a discussion with him about his driving evaluation and as long as he had his seizure, he may want to go for a reevaluation next year.  However, I am not sure whether he will pass a driving evaluation with his spastic hemiplegia but the patient is adamant about getting evaluated.

## 2023-01-21 NOTE — Assessment & Plan Note (Signed)
He diabetes associated with cerebrovascular disease.  We will continue his current meds and we will check his HgBA1c today.

## 2023-01-21 NOTE — Assessment & Plan Note (Signed)
His goal LDL <55 with his history of stroke and diabetes.  I did increase his lipitor on his last visit and we will recheck his FLP today.

## 2023-01-21 NOTE — Assessment & Plan Note (Signed)
He can go some time without urinating but then he will urinate per his wife.  I will set him up to see urology and they will need to do urodynamics on him.

## 2023-01-21 NOTE — Progress Notes (Signed)
Office Visit  Subjective   Patient ID: Jacob [name]   DOB: 25-Apr-1976   Age: 47 y.o.   MRN: [id number]   Chief Complaint Chief Complaint  Patient presents with   Follow-up     History of Present Illness The patient is a 47 yo male who returns for followup of his history of stroke and gait abnormility.  On his last visit, we did ask homehealth PT and OT to come out and work with him which they are still currently doing.  He already has these services but he switched insurance and they are requesting a new referral.  Again, this patient unfortunately expericence a stroke last year where we took care of him at [facility] and he was eventually transferred to [facility] rehab from 02/14/2022 until 03/07/2022.  This patient is well known to me where I care for him at [facility] this past year where he had a CVA with decompressive craniectomy with cranioplasty with functional deficits.  He has residual left sided hemiparesis from this prior stroke.  He was admitted to inpatient rehab for PT/OT/ST.  They noted some left shoulder subluxation which was treated with taping and ROM and sling when not mobilizing.  He was started on baclofen 10mg  twice daily due to worsening left upper extremity and left lower extremity tone.  They discontinued his Ritalin during his stay as well.   They noted that the patient had improvement in his activity tolerance, balance, postural control and ability to compensate for deficits.  The patient uses a wheelchair at home at this time.  His wife has noted that over the last month he can go a whole day without urinating.  He denies any urgency, dysuria or other problems.  The patient was also seen in 09/2022 where he had a possible seizure.  His wife states that he had a sudden blank stare and fell over which lasted for about 30 seconds.  He did go see neurology in 09/2022 and he was seen previously for this in the ER.  He had a head CT did not show any acute  intracranial abnormality, only the old right MCA stroke.  They started him on Keppra but this made him aggressive with anger and irritability.  They therefore switched him to brivaracetam 50mg  BID.  There is no seizure activity today.  He never had a previous history of seizures, since his stroke, this is the first seizure-like event.  They also sent him for an EEG which was done on 10/21/2022 which was an abnormal EEG recorded while drowsy and awake due to right hemispheric slowing. This is consistent with an area of neuronal dysfunction in the right hemisphere.  They felt he had a partial symptomatic epileipsy with simple partial seizures, not intractable without status epilepticus.    The patient is a 47 year old male who returns for a follow-up visit of his T2 diabetes. He states he was diagnosed with diabetes in 2005.  On his last visit, his diabetes was not controlled and we did start him on metformin. We tried to get him started on [facility] but his insurance would not pay for that.  Since the last visit, there have been no problems. He is currently on:  Lantus 12 Units subcut daily, metformin 500mg  BID and humalog SSI TID with 4-7 units at each meal.  She is not walking as much as they would like. She specifically denies unexplained abdominal pain, nausea or vomiting. He is not  checking his FSBS often but checks it 2-3 times per week where he ranges 80-120.  His last HgBA1c was done 3 months ago and was 7%. He has no long term complications of diabetic retinopathy, nephropathy, or neuropathy.   The patient is a 47 year old male who presents for a follow-up evaluation of hypertension.  Since his last visit, there has been no problems.  The patient has been checking her blood pressure at home. The patient's blood pressure has ranged systollically 90-120's. The patient's current medications include: amlodipine 10mg  daily, metoprolol 25mg  BID, clonidine 0.3mg  TD weekly, and lisinopril 40mg  daily. The patient has  been tolerating her medications well. The patient denies any headache, visual changes, dizziness, lightheadness, chest pain, shortness of breath, weakness/numbness, and edema.   The patient also returns today for routine followup on his cholesterol.  On his last visit, his LDL was 70 and not at goal for his history of stroke and diabetes.  We increased his lipitor from 40mg  to 80mg  daily.  Overall, he states he is doing well and is without any complaints or problems at this time. He specifically denies nausea, vomiting, diarrhea, myalgias, and fatigue. He remains on atorvastatin 40mg  qhs. He is fasting in anticipation for labs today.     Past Medical History Past Medical History:  Diagnosis Date   Allergy    Diabetes mellitus without complication (HCC)    GERD (gastroesophageal reflux disease)    Hypertension    Paralysis (HCC)    Seizures (HCC)    Stroke (HCC)      Allergies Allergies  Allergen Reactions   Hydrochlorothiazide Other (See Comments)    Bilateral muscle cramping     Medications  Current Outpatient Medications:    amLODipine (NORVASC) 10 MG tablet, TAKE 1 TABLET BY MOUTH EVERY DAY, Disp: 90 tablet, Rfl: 1   aspirin EC 81 MG tablet, Take 1 tablet (81 mg total) by mouth daily. Swallow whole., Disp: 30 tablet, Rfl: 12   atorvastatin (LIPITOR) 40 MG tablet, Take 2 tablets (80 mg total) by mouth at bedtime. (Patient taking differently: Take 40 mg by mouth at bedtime.), Disp: 90 tablet, Rfl: 1   baclofen (LIORESAL) 10 MG tablet, TAKE 1 TABLET BY MOUTH THREE TIMES A DAY, Disp: 90 tablet, Rfl: 3   blood glucose meter kit and supplies KIT, Dispense based on patient and insurance preference. Use up to four times daily as directed., Disp: 1 each, Rfl: 0   BRIVIACT 50 MG TABS, TAKE 1 TABLET (50 MG) BY MOUTH 2 (TWO) TIMES DAILY., Disp: 60 tablet, Rfl: 5   cloNIDine (CATAPRES - DOSED IN MG/24 HR) 0.3 mg/24hr patch, PLACE 1 PATCH (0.3 MG TOTAL) ONTO THE SKIN ONCE A WEEK., Disp: 4  patch, Rfl: 1   clotrimazole-betamethasone (LOTRISONE) cream, APPLY 1 APPLICATION TOPICALLY DAILY, Disp: 30 g, Rfl: 0   escitalopram (LEXAPRO) 20 MG tablet, TAKE 1 TABLET BY MOUTH EVERY DAY, Disp: 90 tablet, Rfl: 1   glucose blood (RELION TRUE METRIX TEST STRIPS) test strip, Use as instructed, Disp: 100 each, Rfl: 12   insulin glargine (LANTUS) 100 UNIT/ML injection, Inject 0.1 mLs (10 Units total) into the skin daily. (Patient taking differently: Inject 12 Units into the skin daily.), Disp: 10 mL, Rfl: 0   insulin lispro (HUMALOG KWIKPEN) 100 UNIT/ML KwikPen, INJECT 0-20 UNITS INTO THE SKIN 3 (THREE) TIMES DAILY. INJECT 0-20 UNITS INTO THE SKIN 3 (THREE) TIMES DAILY WITH MEALS. CBG < 70 CALL MD. CBG 70-120 GIVE 0 UNITS  121-150 GIVE 3 UNITS 151-200 GIVE 4 UNITS, 201-250 GIVE 7 UNITS 251-300 GIVE 11 UNITS 301-350 GIVE 15 UNITS 351-400 GIVE 20 UNITS GREATER THAN 400 CALL MD, Disp: 15 mL, Rfl: 1   Lancets Ultra Fine MISC, Use as directed, Disp: 100 each, Rfl: 11   lisinopril (ZESTRIL) 40 MG tablet, TAKE 1 TABLET BY MOUTH EVERY DAY, Disp: 90 tablet, Rfl: 1   metFORMIN (GLUCOPHAGE) 500 MG tablet, TAKE 1 TABLET BY MOUTH 2 TIMES DAILY WITH A MEAL., Disp: 180 tablet, Rfl: 1   metoprolol tartrate (LOPRESSOR) 25 MG tablet, Take 1 tablet (25 mg total) by mouth 2 (two) times daily., Disp: 60 tablet, Rfl: 11   sildenafil (VIAGRA) 50 MG tablet, Take 1 tablet (50 mg total) by mouth daily as needed for erectile dysfunction., Disp: 10 tablet, Rfl: 2   traZODone (DESYREL) 50 MG tablet, TAKE 1 TABLET BY MOUTH EVERYDAY AT BEDTIME, Disp: 90 tablet, Rfl: 1   Review of Systems Review of Systems  Constitutional:  Negative for chills, fever and malaise/fatigue.  Eyes:  Negative for blurred vision and double vision.  Respiratory:  Negative for cough and shortness of breath.   Cardiovascular:  Negative for chest pain, palpitations and leg swelling.  Gastrointestinal:  Negative for abdominal pain, constipation, diarrhea,  nausea and vomiting.  Genitourinary:  Negative for frequency.  Musculoskeletal:  Negative for myalgias.  Skin:  Negative for itching and rash.  Neurological:  Negative for dizziness, focal weakness and headaches.  Endo/Heme/Allergies:  Negative for polydipsia.       Objective:    Vitals BP 116/74   Pulse 60   Temp 98.1 F (36.7 C)   Resp 18   Ht 6\' 1"  (1.854 m)   Wt 189 lb (85.7 kg)   SpO2 98%   BMI 24.94 kg/m    Physical Examination Physical Exam Constitutional:      Appearance: Normal appearance. He is not ill-appearing.  Cardiovascular:     Rate and Rhythm: Normal rate and regular rhythm.     Pulses: Normal pulses.     Heart sounds: No murmur heard.    No friction rub. No gallop.  Pulmonary:     Effort: Pulmonary effort is normal. No respiratory distress.     Breath sounds: No wheezing, rhonchi or rales.  Abdominal:     General: Abdomen is flat. Bowel sounds are normal. There is no distension.     Palpations: Abdomen is soft.     Tenderness: There is no abdominal tenderness.  Musculoskeletal:     Right lower leg: No edema.     Left lower leg: No edema.  Skin:    General: Skin is warm and dry.     Findings: No rash.  Neurological:     Mental Status: He is alert and oriented to person, place, and time.     Comments: He has left sided weakness  Psychiatric:     Comments: He has some mildly slowed attention.  He has more of a blunt affect.  Speech seems normal.          Assessment & Plan:   Cerebrovascular disease We will continue with risk factor modification with control of his BP, DM and HCL.  He is being followed by neurology.  He is no longer on elquis but is on an ASA 81mg  daily.  Diabetes mellitus (HCC) He diabetes associated with cerebrovascular disease.  We will continue his current meds and we will check his HgBA1c today.  Spastic hemiplegia of  left nondominant side as late effect of cerebral infarction [facility]) He will continue with his PT and OT  therapies.  He is going for a BOTOX injection of his left hand/arm next month.  Seizure disorder (HCC) His seizure meds were changed by neurology and he had his EEG as described above.  He remains seizure free.  I had a discussion with him about his driving evaluation and as long as he had his seizure, he may want to go for a reevaluation next year.  However, I am not sure whether he will pass a driving evaluation with his spastic hemiplegia but the patient is adamant about getting evaluated.  Essential hypertension His BP is currently controlled.  We will continue his current medications.  Hyperlipidemia His goal LDL <55 with his history of stroke and diabetes.  I did increase his lipitor on his last visit and we will recheck his FLP today.  Urinary retention He can go some time without urinating but then he will urinate per his wife.  I will set him up to see urology and they will need to do urodynamics on him.    Return in about 3 months (around 04/23/2023).   [name], MD

## 2023-01-21 NOTE — Assessment & Plan Note (Signed)
He will continue with his PT and OT therapies.  He is going for a BOTOX injection of his left hand/arm next month.

## 2023-01-22 DIAGNOSIS — I10 Essential (primary) hypertension: Secondary | ICD-10-CM | POA: Diagnosis not present

## 2023-01-22 DIAGNOSIS — Z794 Long term (current) use of insulin: Secondary | ICD-10-CM | POA: Diagnosis not present

## 2023-01-22 DIAGNOSIS — I69354 Hemiplegia and hemiparesis following cerebral infarction affecting left non-dominant side: Secondary | ICD-10-CM | POA: Diagnosis not present

## 2023-01-22 DIAGNOSIS — Z993 Dependence on wheelchair: Secondary | ICD-10-CM | POA: Diagnosis not present

## 2023-01-22 DIAGNOSIS — Z7984 Long term (current) use of oral hypoglycemic drugs: Secondary | ICD-10-CM | POA: Diagnosis not present

## 2023-01-22 DIAGNOSIS — E119 Type 2 diabetes mellitus without complications: Secondary | ICD-10-CM | POA: Diagnosis not present

## 2023-01-22 DIAGNOSIS — Z7901 Long term (current) use of anticoagulants: Secondary | ICD-10-CM | POA: Diagnosis not present

## 2023-01-22 LAB — CMP14 + ANION GAP
ALT: 13 IU/L (ref 0–44)
AST: 20 IU/L (ref 0–40)
Albumin: 4.4 g/dL (ref 4.1–5.1)
Alkaline Phosphatase: 155 IU/L — ABNORMAL HIGH (ref 44–121)
Anion Gap: 16 mmol/L (ref 10.0–18.0)
BUN/Creatinine Ratio: 18 (ref 9–20)
BUN: 15 mg/dL (ref 6–24)
Bilirubin Total: 1.4 mg/dL — ABNORMAL HIGH (ref 0.0–1.2)
CO2: 26 mmol/L (ref 20–29)
Calcium: 9.8 mg/dL (ref 8.7–10.2)
Chloride: 103 mmol/L (ref 96–106)
Creatinine, Ser: 0.85 mg/dL (ref 0.76–1.27)
Globulin, Total: 2.7 g/dL (ref 1.5–4.5)
Glucose: 105 mg/dL — ABNORMAL HIGH (ref 70–99)
Potassium: 4 mmol/L (ref 3.5–5.2)
Sodium: 145 mmol/L — ABNORMAL HIGH (ref 134–144)
Total Protein: 7.1 g/dL (ref 6.0–8.5)
eGFR: 107 mL/min/{1.73_m2} (ref >59)

## 2023-01-22 LAB — LIPID PANEL
Chol/HDL Ratio: 2.9 ratio (ref 0.0–5.0)
Cholesterol, Total: 91 mg/dL — ABNORMAL LOW (ref 100–199)
HDL: 31 mg/dL — ABNORMAL LOW (ref >39)
LDL Chol Calc (NIH): 37 mg/dL (ref 0–99)
Triglycerides: 130 mg/dL (ref 0–149)
VLDL Cholesterol Cal: 23 mg/dL (ref 5–40)

## 2023-01-22 LAB — HEMOGLOBIN A1C
Est. average glucose Bld gHb Est-mCnc: 140 mg/dL
Hgb A1c MFr Bld: 6.5 % — ABNORMAL HIGH (ref 4.8–5.6)

## 2023-01-24 ENCOUNTER — Other Ambulatory Visit: Payer: Self-pay | Admitting: Neurology

## 2023-01-24 DIAGNOSIS — G40909 Epilepsy, unspecified, not intractable, without status epilepticus: Secondary | ICD-10-CM

## 2023-01-24 MED ORDER — BRIVIACT 50 MG PO TABS
50.0000 mg | ORAL_TABLET | Freq: Two times a day (BID) | ORAL | 3 refills | Status: DC
Start: 2023-01-24 — End: 2023-08-10

## 2023-01-26 ENCOUNTER — Other Ambulatory Visit: Payer: Self-pay | Admitting: Internal Medicine

## 2023-01-26 ENCOUNTER — Ambulatory Visit: Payer: Medicaid Other

## 2023-01-26 DIAGNOSIS — F3289 Other specified depressive episodes: Secondary | ICD-10-CM | POA: Diagnosis not present

## 2023-01-27 ENCOUNTER — Other Ambulatory Visit: Payer: Self-pay

## 2023-01-28 DIAGNOSIS — I10 Essential (primary) hypertension: Secondary | ICD-10-CM | POA: Diagnosis not present

## 2023-01-28 DIAGNOSIS — I69354 Hemiplegia and hemiparesis following cerebral infarction affecting left non-dominant side: Secondary | ICD-10-CM | POA: Diagnosis not present

## 2023-01-28 DIAGNOSIS — E119 Type 2 diabetes mellitus without complications: Secondary | ICD-10-CM | POA: Diagnosis not present

## 2023-01-28 DIAGNOSIS — Z993 Dependence on wheelchair: Secondary | ICD-10-CM | POA: Diagnosis not present

## 2023-01-28 DIAGNOSIS — Z7984 Long term (current) use of oral hypoglycemic drugs: Secondary | ICD-10-CM | POA: Diagnosis not present

## 2023-01-28 DIAGNOSIS — Z7901 Long term (current) use of anticoagulants: Secondary | ICD-10-CM | POA: Diagnosis not present

## 2023-01-28 DIAGNOSIS — Z794 Long term (current) use of insulin: Secondary | ICD-10-CM | POA: Diagnosis not present

## 2023-01-28 NOTE — Progress Notes (Signed)
His labs look good. His diabetes and cholesterol are controlled.  Spoke with patient, aware of Labs

## 2023-01-29 DIAGNOSIS — E119 Type 2 diabetes mellitus without complications: Secondary | ICD-10-CM | POA: Diagnosis not present

## 2023-01-29 DIAGNOSIS — Z7984 Long term (current) use of oral hypoglycemic drugs: Secondary | ICD-10-CM | POA: Diagnosis not present

## 2023-01-29 DIAGNOSIS — I10 Essential (primary) hypertension: Secondary | ICD-10-CM | POA: Diagnosis not present

## 2023-01-29 DIAGNOSIS — Z7901 Long term (current) use of anticoagulants: Secondary | ICD-10-CM | POA: Diagnosis not present

## 2023-01-29 DIAGNOSIS — Z993 Dependence on wheelchair: Secondary | ICD-10-CM | POA: Diagnosis not present

## 2023-01-29 DIAGNOSIS — Z794 Long term (current) use of insulin: Secondary | ICD-10-CM | POA: Diagnosis not present

## 2023-01-29 DIAGNOSIS — I69354 Hemiplegia and hemiparesis following cerebral infarction affecting left non-dominant side: Secondary | ICD-10-CM | POA: Diagnosis not present

## 2023-02-02 ENCOUNTER — Ambulatory Visit: Payer: Medicaid Other

## 2023-02-04 ENCOUNTER — Ambulatory Visit (INDEPENDENT_AMBULATORY_CARE_PROVIDER_SITE_OTHER): Payer: Medicaid Other | Admitting: Podiatry

## 2023-02-04 DIAGNOSIS — I69354 Hemiplegia and hemiparesis following cerebral infarction affecting left non-dominant side: Secondary | ICD-10-CM | POA: Diagnosis not present

## 2023-02-04 DIAGNOSIS — I10 Essential (primary) hypertension: Secondary | ICD-10-CM | POA: Diagnosis not present

## 2023-02-04 DIAGNOSIS — Z7901 Long term (current) use of anticoagulants: Secondary | ICD-10-CM | POA: Diagnosis not present

## 2023-02-04 DIAGNOSIS — Z7984 Long term (current) use of oral hypoglycemic drugs: Secondary | ICD-10-CM | POA: Diagnosis not present

## 2023-02-04 DIAGNOSIS — Z91199 Patient's noncompliance with other medical treatment and regimen due to unspecified reason: Secondary | ICD-10-CM

## 2023-02-04 DIAGNOSIS — E119 Type 2 diabetes mellitus without complications: Secondary | ICD-10-CM | POA: Diagnosis not present

## 2023-02-04 DIAGNOSIS — Z993 Dependence on wheelchair: Secondary | ICD-10-CM | POA: Diagnosis not present

## 2023-02-04 DIAGNOSIS — Z794 Long term (current) use of insulin: Secondary | ICD-10-CM | POA: Diagnosis not present

## 2023-02-04 NOTE — Progress Notes (Signed)
1. No-show for appointment     

## 2023-02-05 DIAGNOSIS — Z7984 Long term (current) use of oral hypoglycemic drugs: Secondary | ICD-10-CM | POA: Diagnosis not present

## 2023-02-05 DIAGNOSIS — I10 Essential (primary) hypertension: Secondary | ICD-10-CM | POA: Diagnosis not present

## 2023-02-05 DIAGNOSIS — E119 Type 2 diabetes mellitus without complications: Secondary | ICD-10-CM | POA: Diagnosis not present

## 2023-02-05 DIAGNOSIS — Z993 Dependence on wheelchair: Secondary | ICD-10-CM | POA: Diagnosis not present

## 2023-02-05 DIAGNOSIS — I69354 Hemiplegia and hemiparesis following cerebral infarction affecting left non-dominant side: Secondary | ICD-10-CM | POA: Diagnosis not present

## 2023-02-05 DIAGNOSIS — Z794 Long term (current) use of insulin: Secondary | ICD-10-CM | POA: Diagnosis not present

## 2023-02-05 DIAGNOSIS — Z7901 Long term (current) use of anticoagulants: Secondary | ICD-10-CM | POA: Diagnosis not present

## 2023-02-08 DIAGNOSIS — I69354 Hemiplegia and hemiparesis following cerebral infarction affecting left non-dominant side: Secondary | ICD-10-CM | POA: Diagnosis not present

## 2023-02-08 DIAGNOSIS — Z794 Long term (current) use of insulin: Secondary | ICD-10-CM | POA: Diagnosis not present

## 2023-02-08 DIAGNOSIS — Z993 Dependence on wheelchair: Secondary | ICD-10-CM | POA: Diagnosis not present

## 2023-02-08 DIAGNOSIS — Z7901 Long term (current) use of anticoagulants: Secondary | ICD-10-CM | POA: Diagnosis not present

## 2023-02-08 DIAGNOSIS — Z7984 Long term (current) use of oral hypoglycemic drugs: Secondary | ICD-10-CM | POA: Diagnosis not present

## 2023-02-08 DIAGNOSIS — I10 Essential (primary) hypertension: Secondary | ICD-10-CM | POA: Diagnosis not present

## 2023-02-08 DIAGNOSIS — E119 Type 2 diabetes mellitus without complications: Secondary | ICD-10-CM | POA: Diagnosis not present

## 2023-02-09 ENCOUNTER — Ambulatory Visit: Payer: Medicaid Other

## 2023-02-12 DIAGNOSIS — I69354 Hemiplegia and hemiparesis following cerebral infarction affecting left non-dominant side: Secondary | ICD-10-CM | POA: Diagnosis not present

## 2023-02-12 DIAGNOSIS — Z7901 Long term (current) use of anticoagulants: Secondary | ICD-10-CM | POA: Diagnosis not present

## 2023-02-12 DIAGNOSIS — F3289 Other specified depressive episodes: Secondary | ICD-10-CM | POA: Diagnosis not present

## 2023-02-12 DIAGNOSIS — Z7984 Long term (current) use of oral hypoglycemic drugs: Secondary | ICD-10-CM | POA: Diagnosis not present

## 2023-02-12 DIAGNOSIS — I10 Essential (primary) hypertension: Secondary | ICD-10-CM | POA: Diagnosis not present

## 2023-02-12 DIAGNOSIS — Z993 Dependence on wheelchair: Secondary | ICD-10-CM | POA: Diagnosis not present

## 2023-02-12 DIAGNOSIS — Z794 Long term (current) use of insulin: Secondary | ICD-10-CM | POA: Diagnosis not present

## 2023-02-12 DIAGNOSIS — E119 Type 2 diabetes mellitus without complications: Secondary | ICD-10-CM | POA: Diagnosis not present

## 2023-02-16 ENCOUNTER — Ambulatory Visit: Payer: Medicaid Other

## 2023-02-18 ENCOUNTER — Encounter: Payer: Self-pay | Admitting: Neurology

## 2023-02-18 ENCOUNTER — Other Ambulatory Visit: Payer: Self-pay

## 2023-02-18 ENCOUNTER — Telehealth: Payer: Self-pay

## 2023-02-18 ENCOUNTER — Ambulatory Visit: Payer: BC Managed Care – PPO | Admitting: Neurology

## 2023-02-18 VITALS — BP 110/76 | HR 85 | Resp 17 | Ht 73.0 in

## 2023-02-18 DIAGNOSIS — I69354 Hemiplegia and hemiparesis following cerebral infarction affecting left non-dominant side: Secondary | ICD-10-CM

## 2023-02-18 DIAGNOSIS — G40209 Localization-related (focal) (partial) symptomatic epilepsy and epileptic syndromes with complex partial seizures, not intractable, without status epilepticus: Secondary | ICD-10-CM

## 2023-02-18 DIAGNOSIS — I82413 Acute embolism and thrombosis of femoral vein, bilateral: Secondary | ICD-10-CM | POA: Diagnosis not present

## 2023-02-18 DIAGNOSIS — Z8673 Personal history of transient ischemic attack (TIA), and cerebral infarction without residual deficits: Secondary | ICD-10-CM

## 2023-02-18 NOTE — Progress Notes (Signed)
Chief Complaint  Patient presents with   New Patient (Initial Visit)    [id number], wife present Jacob [name]) Sethi internal referral for botox for spasticity      ASSESSMENT AND PLAN  Jacob [name] is a 47 y.o. male   Large right MCA stroke in April 2023, with residual spastic left hemiparesis,  Significant pain of left shoulder, elbow, finger upon passive stretch, limited range of motion,  Would benefit electrical stimulation guided xeomin injection, prior authorization for 600 units, return in 4 weeks for injection  DIAGNOSTIC DATA (LABS, IMAGING, TESTING) - I reviewed patient records, labs, notes, testing and imaging myself where available.   MEDICAL HISTORY:  Jacob [name] is a 47 year old right-handed male accompanied by his wife,, seen in request by vascular specialist Dr.   Pearlean [name], [facility] for evaluation of botulism toxin injection for spastic left hemiparesis,  I reviewed and summarized the referring note.  Past medical history Hypertension Diabetes HLD  Patient suffered large right MCA stroke in April 2023, CTA showed right M1 occlusion,  status post IV TNK followed by mechanical thrombectomy with revascularized with TICI 3 flow established, however patient developed a large right hemisphere infarction significant cytotoxic cerebral edema postprocedure, taken for emergent decompression having craniectomy by Dr. Lovell [name] required tracheostomy for respiratory failure, DVT in right peroneal vein, was treated with Eliquis, TEE showed small intrapulmonary shunt, hemoglobin 8.1 LDL 96,  Seizure September 16, 2022, difficulty tolerating. Keppra, switched to Briviact doing well, no recurrent seizure, EEG showed focal right hemisphere slowing,  Depression anxiety taking Lexapro,  Repeat Doppler study in January 2024 showed resolution of DVT Eliquis was stopped, switched to aspirin 81 mg daily,  He is now back at home, taking few steps with his assistant, significant spasticity weakness  of left upper and lower extremity, painful left shoulder hand upon passive stretch,  PHYSICAL EXAM:   Vitals:   02/18/23 0915  BP: 110/76  Pulse: 85  Resp: 17  Height: 6\' 1"  (1.854 m)   Body mass index is 24.94 kg/m.  PHYSICAL EXAMNIATION:  Gen: NAD, conversant, well nourised, well groomed                     Cardiovascular: Regular rate rhythm, no peripheral edema, warm, nontender. Eyes: Conjunctivae clear without exudates or hemorrhage Neck: Supple, no carotid bruits. Pulmonary: Clear to auscultation bilaterally   NEUROLOGICAL EXAM:  MENTAL STATUS: Speech/cognition: Awake, alert, oriented to history taking and casual conversation CRANIAL NERVES: CN II: Left Hemi visual field deficit, pupils are round equal and briskly reactive to light. CN III, IV, VI: extraocular movement are normal. No ptosis. CN V: Facial sensation is intact to light touch CN VII: Left lower face weakness CN VIII: Hearing is normal to causal conversation. CN IX, X: Phonation is normal. CN XI: Head turning and shoulder shrug are intact  MOTOR: Spastic left hemiparesis, 0 out of 5 for left upper extremity, maximum range of motion of left shoulder at 90 degree, tight left pectoralis muscle, latissimus dorsi, painful with passive stretch, elbow flexion, pronation, thumb in position, painful fingers upon passive stretch  Left lower extremity 1 out of 5, limited range of motion of left knee, maximum flexion 150 degree, tight left hamstring, painful with stretch,  REFLEXES: Hyperreflexia on the left side  SENSORY: Intact to light touch, pinprick and vibratory sensation are intact in fingers and toes.  COORDINATION: There is no trunk or limb dysmetria noted.  GAIT/STANCE: Deferred  REVIEW OF SYSTEMS:  Full 14 system review of systems performed and notable only for as above All other review of systems were negative.   ALLERGIES: Allergies  Allergen Reactions   Hydrochlorothiazide Other (See  Comments)    Bilateral muscle cramping    HOME MEDICATIONS: Current Outpatient Medications  Medication Sig Dispense Refill   amLODipine (NORVASC) 10 MG tablet TAKE 1 TABLET BY MOUTH EVERY DAY 90 tablet 1   aspirin EC 81 MG tablet Take 1 tablet (81 mg total) by mouth daily. Swallow whole. 30 tablet 12   atorvastatin (LIPITOR) 40 MG tablet TAKE 2 TABLETS (80 MG TOTAL) BY MOUTH AT BEDTIME. 90 tablet 1   baclofen (LIORESAL) 10 MG tablet TAKE 1 TABLET BY MOUTH THREE TIMES A DAY 90 tablet 3   blood glucose meter kit and supplies KIT Dispense based on patient and insurance preference. Use up to four times daily as directed. 1 each 0   Brivaracetam (BRIVIACT) 50 MG TABS Take 50 mg by mouth in the morning and at bedtime. 180 tablet 3   cloNIDine (CATAPRES - DOSED IN MG/24 HR) 0.3 mg/24hr patch Place 1 patch (0.3 mg total) onto the skin once a week. 12 patch 1   clotrimazole-betamethasone (LOTRISONE) cream APPLY 1 APPLICATION TOPICALLY DAILY 30 g 0   escitalopram (LEXAPRO) 20 MG tablet TAKE 1 TABLET BY MOUTH EVERY DAY 90 tablet 1   glucose blood (RELION TRUE METRIX TEST STRIPS) test strip Use as instructed 100 each 12   insulin glargine (LANTUS) 100 UNIT/ML injection Inject 0.1 mLs (10 Units total) into the skin daily. (Patient taking differently: Inject 12 Units into the skin daily.) 10 mL 0   insulin lispro (HUMALOG KWIKPEN) 100 UNIT/ML KwikPen INJECT 0-20 UNITS INTO THE SKIN 3 (THREE) TIMES DAILY. INJECT 0-20 UNITS INTO THE SKIN 3 (THREE) TIMES DAILY WITH MEALS. CBG < 70 CALL MD. CBG 70-120 GIVE 0 UNITS 121-150 GIVE 3 UNITS 151-200 GIVE 4 UNITS, 201-250 GIVE 7 UNITS 251-300 GIVE 11 UNITS 301-350 GIVE 15 UNITS 351-400 GIVE 20 UNITS GREATER THAN 400 CALL MD 15 mL 1   Lancets Ultra Fine MISC Use as directed 100 each 11   lisinopril (ZESTRIL) 40 MG tablet TAKE 1 TABLET BY MOUTH EVERY DAY 90 tablet 1   metFORMIN (GLUCOPHAGE) 500 MG tablet TAKE 1 TABLET BY MOUTH 2 TIMES DAILY WITH A MEAL. 180 tablet 1    metoprolol tartrate (LOPRESSOR) 25 MG tablet Take 1 tablet (25 mg total) by mouth 2 (two) times daily. 60 tablet 11   sildenafil (VIAGRA) 50 MG tablet Take 1 tablet (50 mg total) by mouth daily as needed for erectile dysfunction. 10 tablet 2   traZODone (DESYREL) 50 MG tablet TAKE 1 TABLET BY MOUTH EVERYDAY AT BEDTIME 90 tablet 1   No current facility-administered medications for this visit.    PAST MEDICAL HISTORY: Past Medical History:  Diagnosis Date   Allergy    Diabetes mellitus without complication (HCC)    GERD (gastroesophageal reflux disease)    Hypertension    Paralysis (HCC)    Seizures (HCC)    Stroke [facility])     PAST SURGICAL HISTORY: Past Surgical History:  Procedure Laterality Date   APPENDECTOMY     BUBBLE STUDY  10/02/2021   Procedure: BUBBLE STUDY;  Surgeon: Pricilla Riffle, MD;  Location: [facility] ENDOSCOPY;  Service: Cardiovascular;;   CRANIOPLASTY Right 02/09/2022   Procedure: Craniectomy with Replacement of Bone Flap From the Abdomen;  Surgeon: Tressie Stalker, MD;  Location: [facility] OR;  Service: Neurosurgery;  Laterality: Right;   CRANIOTOMY Right 09/15/2021   Procedure: RIGHT DECOMPRESSIVE CRANIOTOMY, PLACEMENT OF BONE FLAP IN ABDOMEN;  Surgeon: Tressie Stalker, MD;  Location: [facility] OR;  Service: Neurosurgery;  Laterality: Right;   IR CT HEAD LTD  09/12/2021   IR PERCUTANEOUS ART THROMBECTOMY/INFUSION INTRACRANIAL INC DIAG ANGIO  09/12/2021   IR US GUIDE VASC ACCESS RIGHT  09/12/2021   LAPAROSCOPIC APPENDECTOMY  10/24/2013   Procedure: APPENDECTOMY LAPAROSCOPIC;  Surgeon: Kandis Cocking, MD;  Location: [facility] ORS;  Service: General;;   RADIOLOGY WITH ANESTHESIA N/A 09/12/2021   Procedure: IR WITH ANESTHESIA;  Surgeon: Julieanne Cotton, MD;  Location: [facility] OR;  Service: Radiology;  Laterality: N/A;   TEE WITHOUT CARDIOVERSION N/A 10/02/2021   Procedure: TRANSESOPHAGEAL ECHOCARDIOGRAM (TEE);  Surgeon: Pricilla Riffle, MD;  Location: [facility] ENDOSCOPY;  Service: Cardiovascular;  Laterality:  N/A;    FAMILY HISTORY: Family History  Problem Relation Age of Onset   Asthma Son    Stroke Neg Hx     SOCIAL HISTORY: Social History   Socioeconomic History   Marital status: Married    Spouse name: Not on file   Number of children: Not on file   Years of education: Not on file   Highest education level: Not on file  Occupational History   Not on file  Tobacco Use   Smoking status: Never   Smokeless tobacco: Never  Vaping Use   Vaping status: Never Used  Substance and Sexual Activity   Alcohol use: Yes    Alcohol/week: 1.0 standard drink of alcohol    Types: 1 Cans of beer per week    Comment: OCCASIONALLY   Drug use: Yes    Types: MDMA (Ecstacy)   Sexual activity: Not on file  Other Topics Concern   Not on file  Social History Narrative   Lives at home with wife and son 37yr old   Right handed   Caffeine-8oz   Social Determinants of Health   Financial Resource Strain: Low Risk  (02/05/2022)   Overall Financial Resource Strain (CARDIA)    Difficulty of Paying Living Expenses: Not very hard  Food Insecurity: No Food Insecurity (03/18/2022)   Hunger Vital Sign    Worried About Running Out of Food in the Last Year: Never true    Ran Out of Food in the Last Year: Never true  Transportation Needs: No Transportation Needs (03/18/2022)   PRAPARE - Administrator, Civil Service (Medical): No    Lack of Transportation (Non-Medical): No  Physical Activity: Not on file  Stress: Not on file  Social Connections: Not on file  Intimate Partner Violence: Not on file      Levert Feinstein, M.D. Ph.D.  [facility] [address] [place], [place] [address] Ph: [phone] Fax: [phone]  CC:  Micki Riley, MD [address] [place],  [place] [address]  [name], MD

## 2023-02-18 NOTE — Telephone Encounter (Signed)
Completed [employer] PA form and placed in nurse pod for MD signature.

## 2023-02-18 NOTE — Telephone Encounter (Signed)
Needs xeomin auth. New start.   Hemiparesis affecting left side as later effect of CVA I69.354, Electrical stimulation [id number]    Xeomin [id number] Units:600   Chemical Denervation of Limbs and trunk muscles [phone]

## 2023-02-22 NOTE — Telephone Encounter (Signed)
Faxed signed PA form to [facility] at [phone].

## 2023-02-23 ENCOUNTER — Ambulatory Visit: Payer: Medicaid Other

## 2023-02-23 NOTE — Telephone Encounter (Signed)
Received approval for buy/bill from [employer].  Auth#: [id number] (02/22/23-01/24/24)

## 2023-02-24 DIAGNOSIS — I10 Essential (primary) hypertension: Secondary | ICD-10-CM | POA: Diagnosis not present

## 2023-02-24 DIAGNOSIS — Z7984 Long term (current) use of oral hypoglycemic drugs: Secondary | ICD-10-CM | POA: Diagnosis not present

## 2023-02-24 DIAGNOSIS — Z993 Dependence on wheelchair: Secondary | ICD-10-CM | POA: Diagnosis not present

## 2023-02-24 DIAGNOSIS — I69354 Hemiplegia and hemiparesis following cerebral infarction affecting left non-dominant side: Secondary | ICD-10-CM | POA: Diagnosis not present

## 2023-02-24 DIAGNOSIS — Z794 Long term (current) use of insulin: Secondary | ICD-10-CM | POA: Diagnosis not present

## 2023-02-24 DIAGNOSIS — F3289 Other specified depressive episodes: Secondary | ICD-10-CM | POA: Diagnosis not present

## 2023-02-24 DIAGNOSIS — E119 Type 2 diabetes mellitus without complications: Secondary | ICD-10-CM | POA: Diagnosis not present

## 2023-02-24 DIAGNOSIS — Z7901 Long term (current) use of anticoagulants: Secondary | ICD-10-CM | POA: Diagnosis not present

## 2023-03-02 ENCOUNTER — Ambulatory Visit: Payer: Medicaid Other | Admitting: Physical Therapy

## 2023-03-03 ENCOUNTER — Telehealth: Payer: BC Managed Care – PPO | Admitting: Internal Medicine

## 2023-03-03 DIAGNOSIS — F3289 Other specified depressive episodes: Secondary | ICD-10-CM | POA: Diagnosis not present

## 2023-03-03 NOTE — Progress Notes (Unsigned)
Office Visit  Subjective   Patient ID: Jacob [name]   DOB: [birth year]   Age: 47 y.o.   MRN: [id number]   Chief Complaint No chief complaint on file.    History of Present Illness The patient is a 47 yo male who returns for followup of his history of stroke and gait abnormility.  On his last visit, we did ask homehealth PT and OT to come out and work with him which they are still currently doing.  He already has these services but he switched insurance and they are requesting a new referral.  Again, this patient unfortunately expericence a stroke last year where we took care of him at [facility] and he was eventually transferred to [facility] Inpatient rehab from 02/14/2022 until 03/07/2022.  This patient is well known to me where I care for him at [facility] this past year where he had a CVA with decompressive craniectomy with cranioplasty with functional deficits.  He has residual left sided hemiparesis from this prior stroke.  He was admitted to inpatient rehab for PT/OT/ST.  They noted some left shoulder subluxation which was treated with taping and ROM and sling when not mobilizing.  He was started on baclofen 10mg  twice daily due to worsening left upper extremity and left lower extremity tone.  They discontinued his Ritalin during his stay as well.   They noted that the patient had improvement in his activity tolerance, balance, postural control and ability to compensate for deficits.  The patient uses a wheelchair at home at this time.  His wife has noted that over the last month he can go a whole day without urinating.  He denies any urgency, dysuria or other problems.      Past Medical History Past Medical History:  Diagnosis Date  . Allergy   . Diabetes mellitus without complication (HCC)   . GERD (gastroesophageal reflux disease)   . Hypertension   . Paralysis (HCC)   . Seizures (HCC)   . Stroke [facility])      Allergies Allergies  Allergen Reactions  .  Hydrochlorothiazide Other (See Comments)    Bilateral muscle cramping     Medications  Current Outpatient Medications:  .  amLODipine (NORVASC) 10 MG tablet, TAKE 1 TABLET BY MOUTH EVERY DAY, Disp: 90 tablet, Rfl: 1 .  aspirin EC 81 MG tablet, Take 1 tablet (81 mg total) by mouth daily. Swallow whole., Disp: 30 tablet, Rfl: 12 .  atorvastatin (LIPITOR) 40 MG tablet, TAKE 2 TABLETS (80 MG TOTAL) BY MOUTH AT BEDTIME., Disp: 90 tablet, Rfl: 1 .  baclofen (LIORESAL) 10 MG tablet, TAKE 1 TABLET BY MOUTH THREE TIMES A DAY, Disp: 90 tablet, Rfl: 3 .  blood glucose meter kit and supplies KIT, Dispense based on patient and insurance preference. Use up to four times daily as directed., Disp: 1 each, Rfl: 0 .  Brivaracetam (BRIVIACT) 50 MG TABS, Take 50 mg by mouth in the morning and at bedtime., Disp: 180 tablet, Rfl: 3 .  cloNIDine (CATAPRES - DOSED IN MG/24 HR) 0.3 mg/24hr patch, Place 1 patch (0.3 mg total) onto the skin once a week., Disp: 12 patch, Rfl: 1 .  clotrimazole-betamethasone (LOTRISONE) cream, APPLY 1 APPLICATION TOPICALLY DAILY, Disp: 30 g, Rfl: 0 .  escitalopram (LEXAPRO) 20 MG tablet, TAKE 1 TABLET BY MOUTH EVERY DAY, Disp: 90 tablet, Rfl: 1 .  glucose blood (RELION TRUE METRIX TEST STRIPS) test strip, Use as instructed, Disp: 100 each, Rfl: 12 .  insulin glargine (LANTUS) 100 UNIT/ML injection, Inject 0.1 mLs (10 Units total) into the skin daily. (Patient taking differently: Inject 12 Units into the skin daily.), Disp: 10 mL, Rfl: 0 .  insulin lispro (HUMALOG KWIKPEN) 100 UNIT/ML KwikPen, INJECT 0-20 UNITS INTO THE SKIN 3 (THREE) TIMES DAILY. INJECT 0-20 UNITS INTO THE SKIN 3 (THREE) TIMES DAILY WITH MEALS. CBG < 70 CALL MD. CBG 70-120 GIVE 0 UNITS 121-150 GIVE 3 UNITS 151-200 GIVE 4 UNITS, 201-250 GIVE 7 UNITS 251-300 GIVE 11 UNITS 301-350 GIVE 15 UNITS 351-400 GIVE 20 UNITS GREATER THAN 400 CALL MD, Disp: 15 mL, Rfl: 1 .  Lancets Ultra Fine MISC, Use as directed, Disp: 100 each, Rfl:  11 .  lisinopril (ZESTRIL) 40 MG tablet, TAKE 1 TABLET BY MOUTH EVERY DAY, Disp: 90 tablet, Rfl: 1 .  metFORMIN (GLUCOPHAGE) 500 MG tablet, TAKE 1 TABLET BY MOUTH 2 TIMES DAILY WITH A MEAL., Disp: 180 tablet, Rfl: 1 .  metoprolol tartrate (LOPRESSOR) 25 MG tablet, Take 1 tablet (25 mg total) by mouth 2 (two) times daily., Disp: 60 tablet, Rfl: 11 .  sildenafil (VIAGRA) 50 MG tablet, Take 1 tablet (50 mg total) by mouth daily as needed for erectile dysfunction., Disp: 10 tablet, Rfl: 2 .  traZODone (DESYREL) 50 MG tablet, TAKE 1 TABLET BY MOUTH EVERYDAY AT BEDTIME, Disp: 90 tablet, Rfl: 1   Review of Systems ROS     Objective:    Vitals There were no vitals taken for this visit.   Physical Examination Physical Exam     Assessment & Plan:   No problem-specific Assessment & Plan notes found for this encounter.    No follow-ups on file.   [name], MD

## 2023-03-05 DIAGNOSIS — I69354 Hemiplegia and hemiparesis following cerebral infarction affecting left non-dominant side: Secondary | ICD-10-CM | POA: Diagnosis not present

## 2023-03-05 DIAGNOSIS — Z993 Dependence on wheelchair: Secondary | ICD-10-CM | POA: Diagnosis not present

## 2023-03-05 DIAGNOSIS — Z7901 Long term (current) use of anticoagulants: Secondary | ICD-10-CM | POA: Diagnosis not present

## 2023-03-05 DIAGNOSIS — I10 Essential (primary) hypertension: Secondary | ICD-10-CM | POA: Diagnosis not present

## 2023-03-05 DIAGNOSIS — Z794 Long term (current) use of insulin: Secondary | ICD-10-CM | POA: Diagnosis not present

## 2023-03-05 DIAGNOSIS — Z7984 Long term (current) use of oral hypoglycemic drugs: Secondary | ICD-10-CM | POA: Diagnosis not present

## 2023-03-05 DIAGNOSIS — E119 Type 2 diabetes mellitus without complications: Secondary | ICD-10-CM | POA: Diagnosis not present

## 2023-03-09 ENCOUNTER — Ambulatory Visit: Payer: Medicaid Other | Admitting: Physical Therapy

## 2023-03-10 DIAGNOSIS — F3289 Other specified depressive episodes: Secondary | ICD-10-CM | POA: Diagnosis not present

## 2023-03-15 ENCOUNTER — Encounter: Payer: Self-pay | Admitting: Neurology

## 2023-03-16 ENCOUNTER — Ambulatory Visit: Payer: Medicaid Other | Admitting: Physical Therapy

## 2023-03-16 DIAGNOSIS — F3289 Other specified depressive episodes: Secondary | ICD-10-CM | POA: Diagnosis not present

## 2023-03-16 NOTE — Telephone Encounter (Signed)
She needs to call 911 when he is aggressive/impulsive. For now, schedule him for an urgent visit either Sethi or the NP.

## 2023-03-16 NOTE — Telephone Encounter (Signed)
Called and spoke to wife, who states patient has been doing more things impulsively recently to put himself and his wife in danger. I advised heavily about Calling 911 and/or taking him to the ER. She states she doesn't feel lik ethe ER or 911 would help him or that it is necessary, I tried to advise against this, she just states she will think about it. She states this has been getting worse over months now, but most recently happened on Sunday. She states it has happened 4/5 times now and he sees a therapist once a week or every other week, but was recently advised to request a psych referral. She states he takes his meds as far she is aware, she is not always there when it is time so she cannot confirm either way. He has not had any seizures recently, or other sudden changes. I advised I would send to provider to make them aware and she stated they only saw Dr. Terrace Arabia for Botox, but I will forward to her and work in for urgent review.

## 2023-03-23 ENCOUNTER — Ambulatory Visit (INDEPENDENT_AMBULATORY_CARE_PROVIDER_SITE_OTHER): Payer: BC Managed Care – PPO | Admitting: Neurology

## 2023-03-23 DIAGNOSIS — R4587 Impulsiveness: Secondary | ICD-10-CM | POA: Diagnosis not present

## 2023-03-23 MED ORDER — DIVALPROEX SODIUM ER 500 MG PO TB24: 500.0000 mg | ORAL_TABLET | Freq: Every day | ORAL | 3 refills | Status: DC

## 2023-03-23 NOTE — Progress Notes (Signed)
[facility] [address] [place]. [place] [address] [phone]       OFFICE FOLLOW-UP NOTE  [name] Date of Birth:  [birth year] Medical Record Number:  [id number]   HPI: 05/06/2022 [name] is a 47 year old Caucasian male seen today for initial office follow-up visit following hospital admission for stroke and April 2023.  He is accompanied by his wife.  History is obtained from them and review of electronic medical records.  I have personally reviewed pertinent available imaging films in PACS.Jacob [name] is a 47 y.o. male with a history of htn and DM who presents with left sided facial droop. Initially he had arm symptoms as well. He was normal at 1:30 am, then started having symptoms and woke his wife up. He described the symptoms as being that he was holding his phone, then suddenly could tnot tell he was holding his phone any more. He woke up  his wife who called 911. EMS noted some arm weakness and severe left facial droop.  He was brought in as a code stroke, and had significant improvement en route.   On initial examination, his arm symptoms had almost completely resolved(mild slowness on sequential opposition of digits, but no drift or ataxia). He still had mild dysarthria and facial weakness, but nothing that appeared disabling. TNK was discussed, but given the mildness of symptoms was not pursued. Over the the next few minutes, it was noted that he had some fluctuation of his symptoms, from mild to severe to mild dysarthria. CTA showed R M1 occlusion. In the setting of fluctuating symptoms which were disabling at times, TNK was re-discussed and decision was made to pursue this. There was delay after this due to elevated blood pressures which needed to be treated.  Right MCA was revascularized with TICI 3 flow established.  Unfortunately the patient yet developed a large right hemispheric infarct and significant cytotoxic cerebral edema postprocedure was taken  for emergent decompressive hemicraniectomy by Dr. Lovell Sheehan with placement of the bone flap in the abdomen.  Left intubated for several days required tracheostomy to manage respiratory failure.  He developed DVT in the right peroneal vein for started on Eliquis for this..  2D echo showed ejection fraction greater than 75%.  TEE showed small intrapulmonary shunt.  Hemoglobin A1c was 8.1.  LDL cholesterol is 96 mg percent.  Was transferred to [facility] and is currently living at home.  Getting home physical and occupational therapy 1 day a week.  He still has significant left hemiplegia function on the left.  He is able to stand with help of the therapist but not able to walk yet.  He can hold onto the walker taking maybe 1 or 2 steps.  He is tolerating Eliquis well without bruising or bleeding.  He has not yet had any follow-up venous Doppler studies.  To see if his DVT has resolved.  Since had his bone flap replaced on 02/09/2022 by Dr. Lovell Sheehan follow-up CT scan of the head on 02/12/2022 showed stable appearance of his right MCA and PCA infarcts without acute abnormality. Update  11/18/2022 : He returns for follow-up after last visit with me 6 months ago.  He was seen in the ER on 09/16/2022 with focal seizure on Keppra 100 twice daily.  Patient had trouble tolerating it is.  He was seen in the office by Dr. Teresa Coombs on 10/01/2022 and switch to Briviact 50 mg twice daily which she seems to be tolerating well without side effects.  EEG done showed focal right hemispheric slowing but no seizures.  He continues to have spastic left hemiplegia and is currently working with physical and Occupational Therapy at home.  He is barely able to stand and walks without hemiwalker with a 1 person assist.  He can help with transfers.   He has some strength in his left shoulder and elbow but not in his hand or fingers.  He is able to lift his left leg against gravity but unable to bear weight.Marland Kitchen  He is still on Eliquis for lower extremity DVT but  lower extremity Doppler study in January 2024 had shown resolution of DVT with no clot noted His blood pressure and sugars have been under good control.  His primary physician had increased his dose of levothyroxine last lipid profile on 09/05/2022 was satisfactory with LDL cholesterol 70 and hemoglobin A1c 7.0. Update 03/23/2023 : Patient is worked into my schedule today upon request from wife.  He has been having some increasingly belligerent, aggressive and impulsive behavior.  1 day he insisted on getting into his car and driving.  The day tried to get a gun into his house.  He can be difficult to control when he gets into such moods.  Fortunately they do not last long.  He has not been violent or hit anybody yet.  The wife states that these behaviors were noted immediately after his stroke in April of last year but seem to have gotten worse recently.  He was started on Keppra initially for seizures in April of this year but had some side effects and was switched to Briviact but is only on a low-dose 50 mg twice daily and has done well and has been seizure-free.  Continues to have significant spastic left hemiplegia and is able to walk with a hemiwalker but with a safety belt and one-person assist.  He has seen Dr. Terrace Arabia for Xeomin injections and is awaiting insurance approval to start them soon.  He has not had any recurrent stroke or TIA symptoms.  He is tolerating aspirin well without side effects.  His blood pressure and cholesterol have been under good control.  Lab work on 01/21/2023 showed LDL cholesterol 37 mg percent and hemoglobin A1c 6.5%. ROS:   14 system review of systems is positive for weakness, difficulty walking, , Tiredness and all other systems negative  PMH:  Past Medical History:  Diagnosis Date   Allergy    Diabetes mellitus without complication (HCC)    GERD (gastroesophageal reflux disease)    Hypertension    Paralysis (HCC)    Seizures (HCC)    Stroke [facility])     Social  History:  Social History   Socioeconomic History   Marital status: Married    Spouse name: Not on file   Number of children: Not on file   Years of education: Not on file   Highest education level: Not on file  Occupational History   Not on file  Tobacco Use   Smoking status: Never   Smokeless tobacco: Never  Vaping Use   Vaping status: Never Used  Substance and Sexual Activity   Alcohol use: Yes    Alcohol/week: 1.0 standard drink of alcohol    Types: 1 Cans of beer per week    Comment: OCCASIONALLY   Drug use: Yes    Types: MDMA (Ecstacy)   Sexual activity: Not on file  Other Topics Concern   Not on file  Social History Narrative   Lives at  home with wife and son 34yr old   Right handed   Caffeine-8oz   Social Determinants of Health   Financial Resource Strain: Low Risk  (02/05/2022)   Overall Financial Resource Strain (CARDIA)    Difficulty of Paying Living Expenses: Not very hard  Food Insecurity: No Food Insecurity (03/18/2022)   Hunger Vital Sign    Worried About Running Out of Food in the Last Year: Never true    Ran Out of Food in the Last Year: Never true  Transportation Needs: No Transportation Needs (03/18/2022)   PRAPARE - Administrator, Civil Service (Medical): No    Lack of Transportation (Non-Medical): No  Physical Activity: Not on file  Stress: Not on file  Social Connections: Not on file  Intimate Partner Violence: Not on file    Medications:   Current Outpatient Medications on File Prior to Visit  Medication Sig Dispense Refill   amLODipine (NORVASC) 10 MG tablet TAKE 1 TABLET BY MOUTH EVERY DAY 90 tablet 1   aspirin EC 81 MG tablet Take 1 tablet (81 mg total) by mouth daily. Swallow whole. 30 tablet 12   atorvastatin (LIPITOR) 40 MG tablet TAKE 2 TABLETS (80 MG TOTAL) BY MOUTH AT BEDTIME. 90 tablet 1   baclofen (LIORESAL) 10 MG tablet TAKE 1 TABLET BY MOUTH THREE TIMES A DAY 90 tablet 3   blood glucose meter kit and supplies KIT  Dispense based on patient and insurance preference. Use up to four times daily as directed. 1 each 0   Brivaracetam (BRIVIACT) 50 MG TABS Take 50 mg by mouth in the morning and at bedtime. 180 tablet 3   cloNIDine (CATAPRES - DOSED IN MG/24 HR) 0.3 mg/24hr patch Place 1 patch (0.3 mg total) onto the skin once a week. 12 patch 1   clotrimazole-betamethasone (LOTRISONE) cream APPLY 1 APPLICATION TOPICALLY DAILY 30 g 0   escitalopram (LEXAPRO) 20 MG tablet TAKE 1 TABLET BY MOUTH EVERY DAY 90 tablet 1   glucose blood (RELION TRUE METRIX TEST STRIPS) test strip Use as instructed 100 each 12   insulin glargine (LANTUS) 100 UNIT/ML injection Inject 0.1 mLs (10 Units total) into the skin daily. (Patient taking differently: Inject 12 Units into the skin daily.) 10 mL 0   insulin lispro (HUMALOG KWIKPEN) 100 UNIT/ML KwikPen INJECT 0-20 UNITS INTO THE SKIN 3 (THREE) TIMES DAILY. INJECT 0-20 UNITS INTO THE SKIN 3 (THREE) TIMES DAILY WITH MEALS. CBG < 70 CALL MD. CBG 70-120 GIVE 0 UNITS 121-150 GIVE 3 UNITS 151-200 GIVE 4 UNITS, 201-250 GIVE 7 UNITS 251-300 GIVE 11 UNITS 301-350 GIVE 15 UNITS 351-400 GIVE 20 UNITS GREATER THAN 400 CALL MD 15 mL 1   Lancets Ultra Fine MISC Use as directed 100 each 11   lisinopril (ZESTRIL) 40 MG tablet TAKE 1 TABLET BY MOUTH EVERY DAY 90 tablet 1   metFORMIN (GLUCOPHAGE) 500 MG tablet TAKE 1 TABLET BY MOUTH 2 TIMES DAILY WITH A MEAL. 180 tablet 1   metoprolol tartrate (LOPRESSOR) 25 MG tablet Take 1 tablet (25 mg total) by mouth 2 (two) times daily. 60 tablet 11   sildenafil (VIAGRA) 50 MG tablet Take 1 tablet (50 mg total) by mouth daily as needed for erectile dysfunction. 10 tablet 2   traZODone (DESYREL) 50 MG tablet TAKE 1 TABLET BY MOUTH EVERYDAY AT BEDTIME 90 tablet 1   No current facility-administered medications on file prior to visit.    Allergies:   Allergies  Allergen Reactions  Hydrochlorothiazide Other (See Comments)    Bilateral muscle cramping    Physical  Exam General: Frail middle-aged African-American male, seated, in no evident distress Head: head normocephalic and atraumatic.  Neck: supple with no carotid or supraclavicular bruits Cardiovascular: regular rate and rhythm, no murmurs Musculoskeletal: no deformity large surgical scar on the scalp from right hemicraniectomy Skin:  no rash/petichiae Vascular:  Normal pulses all extremities There were no vitals filed for this visit.  Neurologic Exam Mental Status: Awake and fully alert. Oriented to place and time. Recent and remote memory intact. Attention span, concentration and fund of knowledge appropriate. Mood and affect appropriate.  Cranial Nerves: Fundoscopic exam not done. Pupils equal, briskly reactive to light. Extraocular movements full without nystagmus. Visual fields show dense left homonymous hemianopsia to confrontation. Hearing intact. Facial sensation intact.  Left lower face weakness., tongue, palate moves normally and symmetrically.  Motor: Spastic left hemiplegia with left upper extremity 0/5 in left lower extremity 1/5 strength with left foot drop.  Tone is increased on the left with spasticity.. Sensory.: intact to touch ,pinprick .position and vibratory sensation.  Coordination: Normal on the right and unable to test on the left.   Gait and Station: Unable to test as patient is nonambulatory  reflexes: 1+ and asymmetric and brisker on the left. Toes downgoing.   NIHSS  11 Modified Rankin  4     11/18/2022    2:21 PM  MMSE - Mini Mental State Exam  Orientation to time 4  Orientation to Place 5  Registration 3  Attention/ Calculation 5  Recall 3  Language- name 2 objects 2  Language- repeat 1  Language- follow 3 step command 3  Language- read & follow direction 1  Write a sentence 1  Copy design 0  Total score 28    ASSESSMENT: 47 year old African-American male with large right MCA infarct secondary to right MCA occlusion s/p IV thrombolysis with TNK followed  by mechanical thrombectomy with successful revascularization with patient developing malignant cytotoxic edema requiring emergent hemicraniectomy but has significant residual spastic left hemiplegia.  Stroke etiology cryptogenic versus intracranial stenosis.  Vascular risk factors of diabetes, hypertension, hyperlipidemia atherosclerosis. He had symptomatic seizures on 09/16/2022 and was initially started on Keppra but switched to Briviact due to side effects but is now having increase impulsivity and aggressive behavior.    PLAN:I had a long discussion with the patient and his wife regarding his recent concerning behaviors of impulsivity and aggression which began after his stroke but seem to have worsened.  I recommend starting Depakote ER 500 mg daily for behavior as well as seizure prophylaxis and in the future tapering and discontinuing Briviact.  She was advised to call 911 in case his aggressive behavior worsens or if he is presents danger to himself and others.  Continue aspirin for stroke prevention and maintain aggressive risk factor modification with strict control of hypertension with blood pressure goal below 130/90, diabetes with hemoglobin A1c goal below 6.5% and lipids with LDL cholesterol goal below 70 mg percent.  He will continue follow-up with Dr. Debarah Crape for Xeomin for spasticity.  Follow-up with my nurse practitioner in 3 months or call earlier if necessary. Greater than 50% of time during this   35 minute  visit was spent on counseling,explanation of diagnosis office poststroke spasticity and recent behavioral changes, planning of further management, discussion with patient and family and coordination of care  Delia Heady, MD  Note: This document was prepared with digital dictation and possible  smart Lobbyist. Any transcriptional errors that result from this process are unintentional

## 2023-03-23 NOTE — Patient Instructions (Addendum)
I had a long discussion with the patient and his wife regarding his recent concerning behaviors of impulsivity and aggression which began after his stroke but seem to have worsened.  I recommend starting Depakote ER 500 mg daily for behavior as well as seizure prophylaxis and in the future tapering and discontinuing Briviact.   She was advised to call 911 in case his aggressive behavior worsens or if he is presents danger to himself and others. Continue aspirin for stroke prevention and maintain aggressive risk factor modification with strict control of hypertension with blood pressure goal below 130/90, diabetes with hemoglobin A1c goal below 6.5% and lipids with LDL cholesterol goal below 70 mg percent.  He will continue follow-up with Dr. Debarah Crape for Xeomin for spasticity.  Follow-up with my nurse practitioner in 3 months or call earlier if necessary.

## 2023-03-24 ENCOUNTER — Ambulatory Visit: Payer: BC Managed Care – PPO | Admitting: Neurology

## 2023-03-24 VITALS — BP 107/69 | HR 66

## 2023-03-24 DIAGNOSIS — I69354 Hemiplegia and hemiparesis following cerebral infarction affecting left non-dominant side: Secondary | ICD-10-CM | POA: Diagnosis not present

## 2023-03-24 MED ORDER — INCOBOTULINUMTOXINA 100 UNITS IM SOLR
600.0000 [IU] | Freq: Once | INTRAMUSCULAR | Status: AC
Start: 2023-03-24 — End: 2023-03-24
  Administered 2023-03-24: 600 [IU] via INTRAMUSCULAR

## 2023-03-24 NOTE — Progress Notes (Signed)
xeomin 100units x 6 vial  [id number] [id number] Exp-10.2026 B/B  Bacteriostatic 0.9% Sodium Chloride- 12mL  VHQ:[id number] Expiration: 04.01.2025 NDC: [id number] Dx: N02.725 WITNESSED DG:[id number], CMA

## 2023-03-24 NOTE — Progress Notes (Signed)
Chief Complaint  Patient presents with   Injections    Rm14 Pt is well and ready for injection      ASSESSMENT AND PLAN  Jacob [name] is a 47 y.o. male   Large right MCA stroke in April 2023, with residual spastic left hemiparesis,  Significant pain of left shoulder, elbow, finger upon passive stretch, limited range of motion,  Electrical stimulation guided xeomin injection for spastic left hemiparesis, Used xeomin 600 units (100 units / 2 cc of normal saline), Focused on injections to spastic left upper extremity today  Left pectoralis major 100 units(divided into 4 injection sites) Left latissimus dorsi 100 units (divided into 4 injection sites) Left pronator teres 100 units Left brachialis 100 units Left flexor digitorum profundus 50 units Left flexor digitorum superficialis 50 units Left palmaris longus 50 units Left brachial radialis 50 units    DIAGNOSTIC DATA (LABS, IMAGING, TESTING) - I reviewed patient records, labs, notes, testing and imaging myself where available.   MEDICAL HISTORY:  Jacob [name] is a 47 year old right-handed male accompanied by his wife,, seen in request by vascular specialist Dr.   Pearlean Brownie, [facility] for evaluation of botulism toxin injection for spastic left hemiparesis,  I reviewed and summarized the referring note.  Past medical history Hypertension Diabetes HLD  Patient suffered large right MCA stroke in April 2023, CTA showed right M1 occlusion,  status post IV TNK followed by mechanical thrombectomy with revascularized with TICI 3 flow established, however patient developed a large right hemisphere infarction significant cytotoxic cerebral edema postprocedure, taken for emergent decompression having craniectomy by Dr. Lovell Sheehan required tracheostomy for respiratory failure, DVT in right peroneal vein, was treated with Eliquis, TEE showed small intrapulmonary shunt, hemoglobin 8.1 LDL 96,  Seizure September 16, 2022, difficulty tolerating.  Keppra, switched to Briviact doing well, no recurrent seizure, EEG showed focal right hemisphere slowing,  Depression anxiety taking Lexapro,  Repeat Doppler study in January 2024 showed resolution of DVT Eliquis was stopped, switched to aspirin 81 mg daily,  He is now back at home, taking few steps his assistant, significant spastic weakness of left upper and lower extremity, painful left shoulder hand upon passive stretch,  PHYSICAL EXAM: NEUROLOGICAL EXAM: Vitals:   03/24/23 0850  BP: 107/69  Pulse: 66    MENTAL STATUS: Speech/cognition: Awake, alert, oriented to history taking and casual conversation CRANIAL NERVES: CN II: Left Hemi visual field deficit, pupils are round equal and briskly reactive to light. CN III, IV, VI: extraocular movement are normal. No ptosis. CN V: Facial sensation is intact to light touch CN VII: Left lower face weakness CN VIII: Hearing is normal to causal conversation. CN IX, X: Phonation is normal. CN XI: Head turning and shoulder shrug are intact  MOTOR: Spastic left hemiparesis, 0 out of 5 for left upper extremity, maximum range of motion of left shoulder at 90 degree, tight left pectoralis muscle, latissimus dorsi, painful with passive stretch, elbow flexion, pronation, thumb in position, painful fingers upon passive stretch  Left lower extremity 1 out of 5, limited range of motion of left knee, maximum flexion 150 degree, tight left hamstring, painful with stretch,  REFLEXES: Hyperreflexia on the left side  SENSORY: Intact to light touch, pinprick and vibratory sensation are intact in fingers and toes.  COORDINATION: There is no trunk or limb dysmetria noted.  GAIT/STANCE: Need assistant to get up from seated position, difficulty picking up her left leg, on the left foot, dragging left side  REVIEW OF  SYSTEMS:  Full 14 system review of systems performed and notable only for as above All other review of systems were  negative.   ALLERGIES: Allergies  Allergen Reactions   Hydrochlorothiazide Other (See Comments)    Bilateral muscle cramping    HOME MEDICATIONS: Current Outpatient Medications  Medication Sig Dispense Refill   amLODipine (NORVASC) 10 MG tablet TAKE 1 TABLET BY MOUTH EVERY DAY 90 tablet 1   aspirin EC 81 MG tablet Take 1 tablet (81 mg total) by mouth daily. Swallow whole. 30 tablet 12   atorvastatin (LIPITOR) 40 MG tablet TAKE 2 TABLETS (80 MG TOTAL) BY MOUTH AT BEDTIME. 90 tablet 1   baclofen (LIORESAL) 10 MG tablet TAKE 1 TABLET BY MOUTH THREE TIMES A DAY 90 tablet 3   blood glucose meter kit and supplies KIT Dispense based on patient and insurance preference. Use up to four times daily as directed. 1 each 0   Brivaracetam (BRIVIACT) 50 MG TABS Take 50 mg by mouth in the morning and at bedtime. 180 tablet 3   cloNIDine (CATAPRES - DOSED IN MG/24 HR) 0.3 mg/24hr patch Place 1 patch (0.3 mg total) onto the skin once a week. 12 patch 1   clotrimazole-betamethasone (LOTRISONE) cream APPLY 1 APPLICATION TOPICALLY DAILY 30 g 0   divalproex (DEPAKOTE ER) 500 MG 24 hr tablet Take 1 tablet (500 mg total) by mouth daily. 30 tablet 3   escitalopram (LEXAPRO) 20 MG tablet TAKE 1 TABLET BY MOUTH EVERY DAY 90 tablet 1   glucose blood (RELION TRUE METRIX TEST STRIPS) test strip Use as instructed 100 each 12   insulin glargine (LANTUS) 100 UNIT/ML injection Inject 0.1 mLs (10 Units total) into the skin daily. (Patient taking differently: Inject 12 Units into the skin daily.) 10 mL 0   insulin lispro (HUMALOG KWIKPEN) 100 UNIT/ML KwikPen INJECT 0-20 UNITS INTO THE SKIN 3 (THREE) TIMES DAILY. INJECT 0-20 UNITS INTO THE SKIN 3 (THREE) TIMES DAILY WITH MEALS. CBG < 70 CALL MD. CBG 70-120 GIVE 0 UNITS 121-150 GIVE 3 UNITS 151-200 GIVE 4 UNITS, 201-250 GIVE 7 UNITS 251-300 GIVE 11 UNITS 301-350 GIVE 15 UNITS 351-400 GIVE 20 UNITS GREATER THAN 400 CALL MD 15 mL 1   Lancets Ultra Fine MISC Use as directed 100  each 11   lisinopril (ZESTRIL) 40 MG tablet TAKE 1 TABLET BY MOUTH EVERY DAY 90 tablet 1   metFORMIN (GLUCOPHAGE) 500 MG tablet TAKE 1 TABLET BY MOUTH 2 TIMES DAILY WITH A MEAL. 180 tablet 1   metoprolol tartrate (LOPRESSOR) 25 MG tablet Take 1 tablet (25 mg total) by mouth 2 (two) times daily. 60 tablet 11   sildenafil (VIAGRA) 50 MG tablet Take 1 tablet (50 mg total) by mouth daily as needed for erectile dysfunction. 10 tablet 2   traZODone (DESYREL) 50 MG tablet TAKE 1 TABLET BY MOUTH EVERYDAY AT BEDTIME 90 tablet 1   Current Facility-Administered Medications  Medication Dose Route Frequency Provider Last Rate Last Admin   incobotulinumtoxinA (XEOMIN) 100 units injection 600 Units  600 Units Intramuscular Once Levert Feinstein, MD        PAST MEDICAL HISTORY: Past Medical History:  Diagnosis Date   Allergy    Diabetes mellitus without complication (HCC)    GERD (gastroesophageal reflux disease)    Hypertension    Paralysis (HCC)    Seizures (HCC)    Stroke (HCC)     PAST SURGICAL HISTORY: Past Surgical History:  Procedure Laterality Date   APPENDECTOMY  BUBBLE STUDY  10/02/2021   Procedure: BUBBLE STUDY;  Surgeon: Pricilla Riffle, MD;  Location: [facility] ENDOSCOPY;  Service: Cardiovascular;;   CRANIOPLASTY Right 02/09/2022   Procedure: Craniectomy with Replacement of Bone Flap From the Abdomen;  Surgeon: Tressie Stalker, MD;  Location: [facility] OR;  Service: Neurosurgery;  Laterality: Right;   CRANIOTOMY Right 09/15/2021   Procedure: RIGHT DECOMPRESSIVE CRANIOTOMY, PLACEMENT OF BONE FLAP IN ABDOMEN;  Surgeon: Tressie Stalker, MD;  Location: [facility] OR;  Service: Neurosurgery;  Laterality: Right;   IR CT HEAD LTD  09/12/2021   IR PERCUTANEOUS ART THROMBECTOMY/INFUSION INTRACRANIAL INC DIAG ANGIO  09/12/2021   IR US GUIDE VASC ACCESS RIGHT  09/12/2021   LAPAROSCOPIC APPENDECTOMY  10/24/2013   Procedure: APPENDECTOMY LAPAROSCOPIC;  Surgeon: Kandis Cocking, MD;  Location: [facility] ORS;  Service: General;;    RADIOLOGY WITH ANESTHESIA N/A 09/12/2021   Procedure: IR WITH ANESTHESIA;  Surgeon: Julieanne Cotton, MD;  Location: [facility] OR;  Service: Radiology;  Laterality: N/A;   TEE WITHOUT CARDIOVERSION N/A 10/02/2021   Procedure: TRANSESOPHAGEAL ECHOCARDIOGRAM (TEE);  Surgeon: Pricilla Riffle, MD;  Location: [facility] ENDOSCOPY;  Service: Cardiovascular;  Laterality: N/A;    FAMILY HISTORY: Family History  Problem Relation Age of Onset   Asthma Son    Stroke Neg Hx     SOCIAL HISTORY: Social History   Socioeconomic History   Marital status: Married    Spouse name: Not on file   Number of children: Not on file   Years of education: Not on file   Highest education level: Not on file  Occupational History   Not on file  Tobacco Use   Smoking status: Never   Smokeless tobacco: Never  Vaping Use   Vaping status: Never Used  Substance and Sexual Activity   Alcohol use: Yes    Alcohol/week: 1.0 standard drink of alcohol    Types: 1 Cans of beer per week    Comment: OCCASIONALLY   Drug use: Yes    Types: MDMA (Ecstacy)   Sexual activity: Not on file  Other Topics Concern   Not on file  Social History Narrative   Lives at home with wife and son 20yr old   Right handed   Caffeine-8oz   Social Determinants of Health   Financial Resource Strain: Low Risk  (02/05/2022)   Overall Financial Resource Strain (CARDIA)    Difficulty of Paying Living Expenses: Not very hard  Food Insecurity: No Food Insecurity (03/18/2022)   Hunger Vital Sign    Worried About Running Out of Food in the Last Year: Never true    Ran Out of Food in the Last Year: Never true  Transportation Needs: No Transportation Needs (03/18/2022)   PRAPARE - Administrator, Civil Service (Medical): No    Lack of Transportation (Non-Medical): No  Physical Activity: Not on file  Stress: Not on file  Social Connections: Not on file  Intimate Partner Violence: Not on file      Levert Feinstein, M.D. Ph.D.  [facility] [address] [place], [place] [address] Ph: [phone] Fax: [phone]  CC:  [name], MD [address] [place],  [place] [address]  [name], MD

## 2023-03-31 DIAGNOSIS — F3289 Other specified depressive episodes: Secondary | ICD-10-CM | POA: Diagnosis not present

## 2023-04-07 ENCOUNTER — Ambulatory Visit: Payer: Medicaid Other | Admitting: Neurology

## 2023-04-13 DIAGNOSIS — F3289 Other specified depressive episodes: Secondary | ICD-10-CM | POA: Diagnosis not present

## 2023-04-26 ENCOUNTER — Other Ambulatory Visit: Payer: Self-pay | Admitting: Internal Medicine

## 2023-04-28 ENCOUNTER — Ambulatory Visit: Payer: BC Managed Care – PPO | Admitting: Internal Medicine

## 2023-04-30 ENCOUNTER — Other Ambulatory Visit: Payer: Self-pay | Admitting: Internal Medicine

## 2023-05-05 ENCOUNTER — Ambulatory Visit: Payer: BC Managed Care – PPO | Admitting: Internal Medicine

## 2023-05-12 ENCOUNTER — Other Ambulatory Visit: Payer: Self-pay | Admitting: Internal Medicine

## 2023-05-12 ENCOUNTER — Encounter: Payer: Self-pay | Admitting: Gastroenterology

## 2023-05-17 DIAGNOSIS — I69354 Hemiplegia and hemiparesis following cerebral infarction affecting left non-dominant side: Secondary | ICD-10-CM | POA: Diagnosis not present

## 2023-05-17 DIAGNOSIS — I10 Essential (primary) hypertension: Secondary | ICD-10-CM | POA: Diagnosis not present

## 2023-05-17 DIAGNOSIS — E119 Type 2 diabetes mellitus without complications: Secondary | ICD-10-CM | POA: Diagnosis not present

## 2023-05-19 ENCOUNTER — Other Ambulatory Visit: Payer: Self-pay | Admitting: Internal Medicine

## 2023-06-02 ENCOUNTER — Encounter: Payer: Self-pay | Admitting: Internal Medicine

## 2023-06-02 ENCOUNTER — Ambulatory Visit: Payer: BC Managed Care – PPO | Admitting: Internal Medicine

## 2023-06-02 VITALS — BP 128/80 | HR 68 | Temp 98.5°F | Resp 18 | Ht 73.0 in | Wt 189.0 lb

## 2023-06-02 DIAGNOSIS — I10 Essential (primary) hypertension: Secondary | ICD-10-CM | POA: Diagnosis not present

## 2023-06-02 DIAGNOSIS — F330 Major depressive disorder, recurrent, mild: Secondary | ICD-10-CM | POA: Diagnosis not present

## 2023-06-02 DIAGNOSIS — E119 Type 2 diabetes mellitus without complications: Secondary | ICD-10-CM | POA: Diagnosis not present

## 2023-06-02 MED ORDER — METFORMIN HCL 500 MG PO TABS: 500.0000 mg | ORAL_TABLET | Freq: Two times a day (BID) | ORAL | 1 refills | Status: DC

## 2023-06-02 NOTE — Progress Notes (Signed)
 Office Visit  Subjective   Patient ID: Jacob [name]   DOB: 01-Sep-1975   Age: 48 y.o.   MRN: [id number]   Chief Complaint Chief Complaint  Patient presents with   Follow-up    3 month follow up     History of Present Illness The patient is a 48 year old male who returns for a follow-up visit of his T2 diabetes. He states he was diagnosed with diabetes in 2005.  On his last visit, his diabetes was not controlled and we did start him on metformin . We tried to get him started on [facility] but his insurance would not pay for that.  Since the last visit, there have been no problems. He is currently on:  Lantus  8 Units subcut daily, metformin  500mg  BID and humalog  SSI TID which he uses rarely.  He specifically denies unexplained abdominal pain, nausea or vomiting. He is not checking his FSBS often but checks it 2-3 times per week where he ranges 100-150.  His last HgBA1c was done 4 months ago and was 6.5%. He has no long term complications of diabetic retinopathy, nephropathy, or neuropathy.    The patient is a 48 year old male who presents for a follow-up evaluation of hypertension.  Since his last visit, there has been no problems.  The patient has been checking her blood pressure at home. The patient's blood pressure has ranged systollically 110's. The patient's current medications include: amlodipine  10mg  daily, metoprolol  25mg  BID, and lisinopril  40mg  daily. The patient has been tolerating her medications well. The patient denies any headache, visual changes, dizziness, lightheadness, chest pain, shortness of breath, weakness/numbness, and edema.   The patient also has a history of depression.  He did see neurology in 02/2023 for increased impulsivity and aggressive behavior.  He had been having some increasingly belligerent, aggressive and impulsive behavior.  He had insisted on getting into his car and driving. He can be difficult to control when he gets into such moods.  Fortunately they do not  last long.  He has not been violent or hit anybody yet.  They recommended starting Depakote  ER 500 mg daily for behavior as well as seizure prophylaxis and in the future tapering and discontinuing Briviact .  Over the interim, his wife states his behaviors are improved.  Today, he states his depression is controlled and doing better.  He remains on lexapro  30mg  daily and trazodone  50mg  po at bedtime for sleep.  He states he is sleeping well.  He is still seeing a therapist and they are doing telehealth visits with him.  He is not having anxiety and no panic attacks. He denies difficulty concentrating, fatigue, suicidal ideation, homicidal ideation, loss of appetite, social withdrawal, and loss of interest in pleasurable activities. This patient feels that she is unable to care for himself. He still has his left side weakness where he is wheelchair bound. He currently lives with his family. He has no significant prior history of mental health disorders.      Past Medical History Past Medical History:  Diagnosis Date   Allergy    Diabetes mellitus without complication (HCC)    GERD (gastroesophageal reflux disease)    Hypertension    Paralysis (HCC)    Seizures (HCC)    Stroke (HCC)      Allergies Allergies  Allergen Reactions   Hydrochlorothiazide  Other (See Comments)    Bilateral muscle cramping     Medications  Current Outpatient Medications:    amLODipine  (NORVASC )  10 MG tablet, TAKE 1 TABLET BY MOUTH EVERY DAY, Disp: 90 tablet, Rfl: 1   aspirin  EC 81 MG tablet, Take 1 tablet (81 mg total) by mouth daily. Swallow whole., Disp: 30 tablet, Rfl: 12   atorvastatin  (LIPITOR) 40 MG tablet, TAKE 2 TABLETS (80 MG TOTAL) BY MOUTH AT BEDTIME., Disp: 180 tablet, Rfl: 0   baclofen  (LIORESAL ) 10 MG tablet, TAKE 1 TABLET BY MOUTH THREE TIMES A DAY, Disp: 90 tablet, Rfl: 3   blood glucose meter kit and supplies KIT, Dispense based on patient and insurance preference. Use up to four times daily as  directed., Disp: 1 each, Rfl: 0   Brivaracetam  (BRIVIACT ) 50 MG TABS, Take 50 mg by mouth in the morning and at bedtime., Disp: 180 tablet, Rfl: 3   clotrimazole -betamethasone  (LOTRISONE ) cream, APPLY 1 APPLICATION TOPICALLY DAILY, Disp: 30 g, Rfl: 0   divalproex  (DEPAKOTE  ER) 500 MG 24 hr tablet, Take 1 tablet (500 mg total) by mouth daily., Disp: 30 tablet, Rfl: 3   escitalopram  (LEXAPRO ) 20 MG tablet, TAKE 1 AND 1/2 TABLETS BY MOUTH DAILY, Disp: 45 tablet, Rfl: 2   glucose blood (RELION TRUE METRIX TEST STRIPS) test strip, Use as instructed, Disp: 100 each, Rfl: 12   insulin  glargine (LANTUS ) 100 UNIT/ML injection, Inject 0.1 mLs (10 Units total) into the skin daily. (Patient taking differently: Inject 12 Units into the skin daily.), Disp: 10 mL, Rfl: 0   insulin  lispro (HUMALOG  KWIKPEN) 100 UNIT/ML KwikPen, INJECT 0-20 UNITS INTO THE SKIN 3 (THREE) TIMES DAILY. INJECT 0-20 UNITS INTO THE SKIN 3 (THREE) TIMES DAILY WITH MEALS. CBG < 70 CALL MD. CBG 70-120 GIVE 0 UNITS 121-150 GIVE 3 UNITS 151-200 GIVE 4 UNITS, 201-250 GIVE 7 UNITS 251-300 GIVE 11 UNITS 301-350 GIVE 15 UNITS 351-400 GIVE 20 UNITS GREATER THAN 400 CALL MD, Disp: 15 mL, Rfl: 1   Lancets Ultra Fine MISC, Use as directed, Disp: 100 each, Rfl: 11   lisinopril  (ZESTRIL ) 40 MG tablet, TAKE 1 TABLET BY MOUTH EVERY DAY, Disp: 90 tablet, Rfl: 1   metFORMIN  (GLUCOPHAGE ) 500 MG tablet, Take 1 tablet (500 mg total) by mouth 2 (two) times daily with a meal., Disp: 180 tablet, Rfl: 1   metoprolol  tartrate (LOPRESSOR ) 25 MG tablet, TAKE 1 TABLET BY MOUTH TWICE A DAY, Disp: 180 tablet, Rfl: 3   sildenafil  (VIAGRA ) 50 MG tablet, Take 1 tablet (50 mg total) by mouth daily as needed for erectile dysfunction., Disp: 10 tablet, Rfl: 2   traZODone  (DESYREL ) 50 MG tablet, TAKE 1 TABLET BY MOUTH EVERYDAY AT BEDTIME, Disp: 90 tablet, Rfl: 1   Review of Systems Review of Systems  Constitutional:  Negative for chills, fever and malaise/fatigue.  Eyes:   Negative for blurred vision and double vision.  Respiratory:  Negative for cough and shortness of breath.   Cardiovascular:  Negative for chest pain, palpitations and leg swelling.  Gastrointestinal:  Negative for abdominal pain, constipation, diarrhea, nausea and vomiting.  Genitourinary:  Negative for frequency.  Musculoskeletal:  Negative for myalgias.  Neurological:  Negative for dizziness, weakness and headaches.  Endo/Heme/Allergies:  Negative for polydipsia.       Objective:    Vitals BP 128/80 (BP Location: Left Arm, Patient Position: Sitting, Cuff Size: Normal)   Pulse 68   Temp 98.5 F (36.9 C)   Resp 18   Ht 6' 1 (1.854 m)   Wt 189 lb (85.7 kg)   SpO2 98%   BMI 24.94 kg/m  Physical Examination Physical Exam Constitutional:      Appearance: Normal appearance. He is not ill-appearing.  Cardiovascular:     Rate and Rhythm: Normal rate and regular rhythm.     Pulses: Normal pulses.     Heart sounds: No murmur heard.    No friction rub. No gallop.  Pulmonary:     Effort: Pulmonary effort is normal. No respiratory distress.     Breath sounds: No wheezing, rhonchi or rales.  Abdominal:     General: Abdomen is flat. Bowel sounds are normal. There is no distension.     Palpations: Abdomen is soft.     Tenderness: There is no abdominal tenderness.  Musculoskeletal:     Right lower leg: No edema.     Left lower leg: No edema.  Skin:    General: Skin is warm and dry.     Findings: No rash.  Neurological:     General: No focal deficit present.     Mental Status: He is alert and oriented to person, place, and time.  Psychiatric:        Mood and Affect: Mood normal.        Behavior: Behavior normal.        Assessment & Plan:   Essential hypertension His BP is controlled.  We will continue on his current medications.  Diabetes mellitus without complication (HCC) He wants to wait until his next visit to check his A1c on his next visit.  We will see him back  in 3 months for a yearly exam.  Mild episode of recurrent major depressive disorder (HCC) His depression is controlled on his current meds.  His behaviors are controlled on his depakote .  We will continue to follow.    Return in about 4 months (around 09/23/2023) for annual.   Selinda Fleeta Finger, MD

## 2023-06-02 NOTE — Assessment & Plan Note (Signed)
 His depression is controlled on his current meds.  His behaviors are controlled on his depakote.  We will continue to follow.

## 2023-06-02 NOTE — Assessment & Plan Note (Signed)
His BP is controlled.  We will continue on his current medications. 

## 2023-06-02 NOTE — Assessment & Plan Note (Signed)
 He wants to wait until his next visit to check his A1c on his next visit.  We will see him back in 3 months for a yearly exam.

## 2023-06-03 NOTE — Progress Notes (Signed)
 [facility] [address] [place]. [place] [address] [phone]       OFFICE FOLLOW-UP NOTE  [name] Date of Birth:  April 26, 1976 Medical Record Number:  [id number]   Primary neurologist: Dr. Rosemarie Reason for visit stroke and seizure follow-up   Chief Complaint  Patient presents with   Follow-up    Pt in room 8, wife and child in room. Here for stoke follow up. Pt reports doing okay, left side weakness, no seizures.      HPI:   Update 06/06/2022 JM: Patient returns for follow-up visit accompanied by his wife who provides majority of history.  At prior visit, started on Depakote  ER 500 mg night for concern of aggressive behavior. Continued on Briviact  for seizure prevention.   Overall he has been doing well.  Notes improvement of behavior since starting Depakote , has been tolerating without side effects.  Also remains on Briviact  50 mg twice daily.  No seizure activity. Continued left spastic hemiplegia, s/p Xeomin  injection by Dr. Onita on on 10/30, notes some improvement of arm tightness, repeat injection scheduled 2/5.  Primarily transfers via wheelchair, is able to stand/pivot for transfers.  Able to lift his left leg but difficulty bearing weight.  He does mention having issues with erectile dysfunction, has been prescribed sildenafil  by PCP but caused headache per wife. No new stroke/TIA symptoms.  Compliant on aspirin  and atorvastatin .  Routinely follows with PCP for stroke risk factor management.     History provided for reference purposes only Update 03/23/2023 Dr. Rosemarie : Patient is worked into my schedule today upon request from wife.  He has been having some increasingly belligerent, aggressive and impulsive behavior.  1 day he insisted on getting into his car and driving.  The day tried to get a gun into his house.  He can be difficult to control when he gets into such moods.  Fortunately they do not last long.  He has not been violent or hit  anybody yet.  The wife states that these behaviors were noted immediately after his stroke in April of last year but seem to have gotten worse recently.  He was started on Keppra  initially for seizures in April of this year but had some side effects and was switched to Briviact  but is only on a low-dose 50 mg twice daily and has done well and has been seizure-free.  Continues to have significant spastic left hemiplegia and is able to walk with a hemiwalker but with a safety belt and one-person assist.  He has seen Dr. Onita for Xeomin  injections and is awaiting insurance approval to start them soon.  He has not had any recurrent stroke or TIA symptoms.  He is tolerating aspirin  well without side effects.  His blood pressure and cholesterol have been under good control.  Lab work on 01/21/2023 showed LDL cholesterol 37 mg percent and hemoglobin A1c 6.5%.  Update  11/18/2022 Dr. Rosemarie: He returns for follow-up after last visit with me 6 months ago.  He was seen in the ER on 09/16/2022 with focal seizure on Keppra  100 twice daily.  Patient had trouble tolerating it is.  He was seen in the office by Dr. Gregg on 10/01/2022 and switch to Briviact  50 mg twice daily which she seems to be tolerating well without side effects.  EEG done showed focal right hemispheric slowing but no seizures.  He continues to have spastic left hemiplegia and is currently working with physical and Occupational Therapy at  home.  He is barely able to stand and walks without hemiwalker with a 1 person assist.  He can help with transfers.   He has some strength in his left shoulder and elbow but not in his hand or fingers.  He is able to lift his left leg against gravity but unable to bear weight.SABRA  He is still on Eliquis  for lower extremity DVT but lower extremity Doppler study in January 2024 had shown resolution of DVT with no clot noted His blood pressure and sugars have been under good control.  His primary physician had increased his dose of  levothyroxine last lipid profile on 09/05/2022 was satisfactory with LDL cholesterol 70 and hemoglobin A1c 7.0.  Update 05/06/2022 Dr. Rosemarie: Jacob [name] is a 48 year old Caucasian male seen today for initial office follow-up visit following hospital admission for stroke and April 2023.  He is accompanied by his wife.  History is obtained from them and review of electronic medical records.  I have personally reviewed pertinent available imaging films in PACS.Jacob [name] is a 48 y.o. male with a history of htn and DM who presents with left sided facial droop. Initially he had arm symptoms as well. He was normal at 1:30 am, then started having symptoms and woke his wife up. He described the symptoms as being that he was holding his phone, then suddenly could tnot tell he was holding his phone any more. He woke up  his wife who called 911. EMS noted some arm weakness and severe left facial droop.  He was brought in as a code stroke, and had significant improvement en route.   On initial examination, his arm symptoms had almost completely resolved(mild slowness on sequential opposition of digits, but no drift or ataxia). He still had mild dysarthria and facial weakness, but nothing that appeared disabling. TNK was discussed, but given the mildness of symptoms was not pursued. Over the the next few minutes, it was noted that he had some fluctuation of his symptoms, from mild to severe to mild dysarthria. CTA showed R M1 occlusion. In the setting of fluctuating symptoms which were disabling at times, TNK was re-discussed and decision was made to pursue this. There was delay after this due to elevated blood pressures which needed to be treated.  Right MCA was revascularized with TICI 3 flow established.  Unfortunately the patient yet developed a large right hemispheric infarct and significant cytotoxic cerebral edema postprocedure was taken for emergent decompressive hemicraniectomy by Dr. Mavis with placement of the  bone flap in the abdomen.  Left intubated for several days required tracheostomy to manage respiratory failure.  He developed DVT in the right peroneal vein for started on Eliquis  for this..  2D echo showed ejection fraction greater than 75%.  TEE showed small intrapulmonary shunt.  Hemoglobin A1c was 8.1.  LDL cholesterol is 96 mg percent.  Was transferred to LTAC and is currently living at home.  Getting home physical and occupational therapy 1 day a week.  He still has significant left hemiplegia function on the left.  He is able to stand with help of the therapist but not able to walk yet.  He can hold onto the walker taking maybe 1 or 2 steps.  He is tolerating Eliquis  well without bruising or bleeding.  He has not yet had any follow-up venous Doppler studies.  To see if his DVT has resolved.  Since had his bone flap replaced on 02/09/2022 by Dr. Mavis follow-up CT scan of  the head on 02/12/2022 showed stable appearance of his right MCA and PCA infarcts without acute abnormality.   Initial visit 12/08/2021 JM: Jacob [name] is a 48 y.o. male with a history of htn and DM who presents with left sided facial droop and left arm weakness.  Initially, significant improvement of symptoms upon route to ED with initial evaluation symptoms almost completely resolved therefore TNK not administered. CTH no acute abnormality, MRI showed tiny acute infarct right MCA.  But then noted fluctuation mild to severe dysarthria, right gaze preference, left hemiplegia and left sided sensory deficit. CTA showed R M1 occlusion. Due to fluctuation of symptoms which were disabling at times, TNK administered after stabilization of blood pressure.  Taken for mechanical thrombectomy with TICI 3 revascularization. MRI showed right MCA territory infarct.  Repeat MRA 4/22 showed reoccluded right M2 branch and left M1 stenosis.  TEE consistent with intrapulmonary shunt.  Repeat CT head 4/24 as difficult to arouse which showed increase edema  with new 12 mm midline shift and underwent decompressive hemicraniectomy with placement of bone flap and abdomen.  Repeat CT head stable.  Left intubated postprocedure and eventual placement of tracheostomy 5/1.  Developed DVT in right peroneal vein and placed on Eliquis .  Placed on atorvastatin  40 mg daily for LDL 96.  A1c 8.1 with diabetic regimen adjusted advised outpatient follow-up with PCP for further management.  Hospital course prolonged by dysphagia requiring TF and NPO status, respiratory failure, fever with leukocytosis with tracheal aspirate +MRSA and headaches.  He was eventually discharged to [facility] on 5/17.     Patient is being seen for initial hospital follow-up accompanied by his wife who provides majority of history.  Currently residing at [facility] care center.  Has been doing well without new stroke/TIA symptoms.  Continues working with PT/OT/SLP. Reports improvement of dysphagia, currently on modified diet without difficulty. Had trach tube removed beginning of June.  Continued right gaze preference but wife believes this has been improving. Continued dense left hemiplegia with sensory impairment. Denies headaches - on topamax  50mg  twice daily.    Did had f/u with neurosurgery approx 3 weeks ago  - currently awaiting for swelling to go down in order to have bone flap replaced.    Per SNF MAR review, compliant on Eliquis  and atorvastatin , denies side effects Blood pressure today 97/67 -monitored at facility and per wife, typically 120-130s.  Glucose levels monitored at facility which have been stable per wife on current insulin  regimen   No further concerns at this time.      ROS:   14 system review of systems is positive for those listed in HPI and all other systems negative  PMH:  Past Medical History:  Diagnosis Date   Allergy    Diabetes mellitus without complication (HCC)    GERD (gastroesophageal reflux disease)    Hypertension    Paralysis (HCC)     Seizures (HCC)    Stroke [facility])     Social History:  Social History   Socioeconomic History   Marital status: Married    Spouse name: Not on file   Number of children: Not on file   Years of education: Not on file   Highest education level: Not on file  Occupational History   Not on file  Tobacco Use   Smoking status: Never   Smokeless tobacco: Never  Vaping Use   Vaping status: Never Used  Substance and Sexual Activity   Alcohol use: Yes  Alcohol/week: 1.0 standard drink of alcohol    Types: 1 Cans of beer per week    Comment: OCCASIONALLY   Drug use: Yes    Types: MDMA (Ecstacy)   Sexual activity: Not on file  Other Topics Concern   Not on file  Social History Narrative   Lives at home with wife and son 27yr old   Right handed   Caffeine-8oz   Social Drivers of Health   Financial Resource Strain: Low Risk  (02/05/2022)   Overall Financial Resource Strain (CARDIA)    Difficulty of Paying Living Expenses: Not very hard  Food Insecurity: No Food Insecurity (03/18/2022)   Hunger Vital Sign    Worried About Running Out of Food in the Last Year: Never true    Ran Out of Food in the Last Year: Never true  Transportation Needs: No Transportation Needs (03/18/2022)   PRAPARE - Administrator, Civil Service (Medical): No    Lack of Transportation (Non-Medical): No  Physical Activity: Not on file  Stress: Not on file  Social Connections: Not on file  Intimate Partner Violence: Not on file    Medications:   Current Outpatient Medications on File Prior to Visit  Medication Sig Dispense Refill   amLODipine  (NORVASC ) 10 MG tablet TAKE 1 TABLET BY MOUTH EVERY DAY 90 tablet 1   aspirin  EC 81 MG tablet Take 1 tablet (81 mg total) by mouth daily. Swallow whole. 30 tablet 12   atorvastatin  (LIPITOR) 40 MG tablet TAKE 2 TABLETS (80 MG TOTAL) BY MOUTH AT BEDTIME. 180 tablet 0   baclofen  (LIORESAL ) 10 MG tablet TAKE 1 TABLET BY MOUTH THREE TIMES A DAY 90 tablet 3    blood glucose meter kit and supplies KIT Dispense based on patient and insurance preference. Use up to four times daily as directed. 1 each 0   Brivaracetam  (BRIVIACT ) 50 MG TABS Take 50 mg by mouth in the morning and at bedtime. 180 tablet 3   clotrimazole -betamethasone  (LOTRISONE ) cream APPLY 1 APPLICATION TOPICALLY DAILY 30 g 0   divalproex  (DEPAKOTE  ER) 500 MG 24 hr tablet Take 1 tablet (500 mg total) by mouth daily. 30 tablet 3   escitalopram  (LEXAPRO ) 20 MG tablet TAKE 1 AND 1/2 TABLETS BY MOUTH DAILY 45 tablet 2   glucose blood (RELION TRUE METRIX TEST STRIPS) test strip Use as instructed 100 each 12   insulin  glargine (LANTUS ) 100 UNIT/ML injection Inject 0.1 mLs (10 Units total) into the skin daily. (Patient taking differently: Inject 12 Units into the skin daily.) 10 mL 0   insulin  lispro (HUMALOG  KWIKPEN) 100 UNIT/ML KwikPen INJECT 0-20 UNITS INTO THE SKIN 3 (THREE) TIMES DAILY. INJECT 0-20 UNITS INTO THE SKIN 3 (THREE) TIMES DAILY WITH MEALS. CBG < 70 CALL MD. CBG 70-120 GIVE 0 UNITS 121-150 GIVE 3 UNITS 151-200 GIVE 4 UNITS, 201-250 GIVE 7 UNITS 251-300 GIVE 11 UNITS 301-350 GIVE 15 UNITS 351-400 GIVE 20 UNITS GREATER THAN 400 CALL MD 15 mL 1   Lancets Ultra Fine MISC Use as directed 100 each 11   lisinopril  (ZESTRIL ) 40 MG tablet TAKE 1 TABLET BY MOUTH EVERY DAY 90 tablet 1   metFORMIN  (GLUCOPHAGE ) 500 MG tablet Take 1 tablet (500 mg total) by mouth 2 (two) times daily with a meal. 180 tablet 1   metoprolol  tartrate (LOPRESSOR ) 25 MG tablet TAKE 1 TABLET BY MOUTH TWICE A DAY 180 tablet 3   sildenafil  (VIAGRA ) 50 MG tablet Take 1 tablet (50 mg total)  by mouth daily as needed for erectile dysfunction. 10 tablet 2   traZODone  (DESYREL ) 50 MG tablet TAKE 1 TABLET BY MOUTH EVERYDAY AT BEDTIME 90 tablet 1   No current facility-administered medications on file prior to visit.    Allergies:   Allergies  Allergen Reactions   Hydrochlorothiazide  Other (See Comments)    Bilateral muscle  cramping    Physical Exam Today's Vitals   06/07/23 1034  BP: 93/63  Pulse: 78  Weight: 180 lb (81.6 kg)  Height: 6' 1 (1.854 m)   Body mass index is 23.75 kg/m.  General: Frail middle-aged African-American male, seated, in no evident distress Head: head normocephalic and atraumatic.  Neck: supple with no carotid or supraclavicular bruits Cardiovascular: regular rate and rhythm, no murmurs Musculoskeletal: no deformity large surgical scar on the scalp from right hemicraniectomy Skin:  no rash/petichiae Vascular:  Normal pulses all extremities  Neurologic Exam Mental Status: Awake and fully alert.  Mild dysarthria.  Oriented to place and time. Recent memory impaired and remote memory intact. Attention span, concentration and fund of knowledge mildly impaired with wife provided majority of history. Mood and affect appropriate.  Cranial Nerves: Pupils equal, briskly reactive to light. Extraocular movements full without nystagmus. Visual fields show dense left homonymous hemianopsia to confrontation. Hearing intact. Facial sensation intact.  Left lower face weakness., tongue, palate moves normally and symmetrically.  Motor: Spastic left hemiplegia with left upper extremity 1/5 proximal, 0/5 distal and left lower extremity 2/5 proximal and 0/5 distal.  Tone is increased on the left with spasticity.. Sensory.: intact to touch ,pinprick .position and vibratory sensation.  Coordination: Normal on the right and unable to test on the left.   Gait and Station: Unable to test as patient is nonambulatory  reflexes: 2+ left upper and lower extremity, 1+ right upper and lower extremity     11/18/2022    2:21 PM  MMSE - Mini Mental State Exam  Orientation to time 4  Orientation to Place 5  Registration 3  Attention/ Calculation 5  Recall 3  Language- name 2 objects 2  Language- repeat 1  Language- follow 3 step command 3  Language- read & follow direction 1  Write a sentence 1  Copy  design 0  Total score 28       ASSESSMENT: 48 year old African-American male with large right MCA infarct in 08/2021 secondary to right MCA occlusion s/p IV thrombolysis with TNK followed by mechanical thrombectomy with successful revascularization with patient developing malignant cytotoxic edema requiring emergent hemicraniectomy. Stroke etiology cryptogenic versus intracranial stenosis with residual left spastic hemiplegia.  He had symptomatic seizures on 09/16/2022 and was initially started on Keppra  but switched to Briviact  due to side effects but is now having increase impulsivity and aggressive behavior therefore started on Depakote  in 02/2023 with improvement of behaviors.    PLAN:  -Continue Depakote  ER 500 mg nightly - will check lab work -will follow up with Dr. Gregg regarding ongoing use of Briviact  -Continue to follow with Dr. Onita for Xeomin  injections for spasticity -Continue aspirin  81 mg daily and atorvastatin  80 mg daily for secondary stroke prevention measures managed/prescribed by PCP -Continue close PCP follow-up for aggressive stroke risk factor management including BP goal<130/90, HLD with LDL goal<70 and DM with A1c.<7  -Advised to follow-up with PCP regarding erectile dysfunction concerns    Follow up in 6 months or call earlier if needed   I spent 30 minutes of face-to-face and non-face-to-face time with patient and wife.  This included previsit chart review, lab review, study review, order entry, electronic health record documentation, patient and wife education and discussion regarding above diagnoses and treatment plan and answered all other questions to patient and wife's satisfaction  Harlene Bogaert, AGNP-BC  [facility] [address] [place], [place] [address]  Phone [phone] Fax [phone] Note: This document was prepared with digital dictation and possible smart phrase technology. Any transcriptional errors that  result from this process are unintentional.

## 2023-06-04 ENCOUNTER — Telehealth: Payer: Self-pay

## 2023-06-04 DIAGNOSIS — Z1211 Encounter for screening for malignant neoplasm of colon: Secondary | ICD-10-CM

## 2023-06-04 DIAGNOSIS — T884XXD Failed or difficult intubation, subsequent encounter: Secondary | ICD-10-CM

## 2023-06-04 NOTE — Telephone Encounter (Signed)
 John, Please review this patient's anesthesia records and advise if patient is cleared for [facility] procedure.  It appears the patient had a difficult intubation with last surgical procedure listed in the medical record.   Please advise Thank you Bre

## 2023-06-05 NOTE — Telephone Encounter (Signed)
 Dr.Armbruster, This patient has been identified as a difficult intubation.  Please advise if you prefer the patient be scheduled at a direct at the hospital or if you would like the patient to be scheduled for an OV? Please Thank you Bre

## 2023-06-06 ENCOUNTER — Other Ambulatory Visit: Payer: Self-pay | Admitting: Internal Medicine

## 2023-06-07 ENCOUNTER — Ambulatory Visit: Payer: BC Managed Care – PPO | Admitting: Adult Health

## 2023-06-07 ENCOUNTER — Telehealth: Payer: Self-pay | Admitting: *Deleted

## 2023-06-07 ENCOUNTER — Other Ambulatory Visit: Payer: Self-pay

## 2023-06-07 ENCOUNTER — Encounter: Payer: Self-pay | Admitting: Adult Health

## 2023-06-07 VITALS — BP 93/63 | HR 78 | Ht 73.0 in | Wt 180.0 lb

## 2023-06-07 DIAGNOSIS — Z5181 Encounter for therapeutic drug level monitoring: Secondary | ICD-10-CM

## 2023-06-07 DIAGNOSIS — I63511 Cerebral infarction due to unspecified occlusion or stenosis of right middle cerebral artery: Secondary | ICD-10-CM

## 2023-06-07 DIAGNOSIS — I69398 Other sequelae of cerebral infarction: Secondary | ICD-10-CM

## 2023-06-07 DIAGNOSIS — R569 Unspecified convulsions: Secondary | ICD-10-CM | POA: Diagnosis not present

## 2023-06-07 DIAGNOSIS — Z1211 Encounter for screening for malignant neoplasm of colon: Secondary | ICD-10-CM

## 2023-06-07 NOTE — Telephone Encounter (Signed)
 Jacob [name],  If for screening and otherwise no significant medical problems I think okay to direct book at the hospital for colonoscopy.  Can you please cancel his scheduled appointment in the [facility] on 2/18 and let him know he will be contacted for scheduling a procedure at the hospital.   Jan - FYI, I am adding him to the wait list for hospital cases, needs colonoscopy for screening. Thanks

## 2023-06-07 NOTE — Telephone Encounter (Signed)
 Yesi,   This pt is a documented difficult intubation and his procedure will need to be done at the hospital.   Thanks,  Cathlyn Parsons

## 2023-06-07 NOTE — Telephone Encounter (Signed)
 Called patient and spoke to wife.  Armbruster has an opening at [facility] on 2-6 at 10:30am.  She said he could do that.  Rescheduled patient for same PV appointment on 1-20 at 2pm on the 2nd floor of [facility].  Scheduled Patient on 2-6. Amb ref done

## 2023-06-07 NOTE — Telephone Encounter (Signed)
 Pt procedure scheduled at [facility]

## 2023-06-07 NOTE — Patient Instructions (Addendum)
 Your Plan:  Continue Depakote  ER 500 mg nightly - will need to obtain lab work today  Will follow up with Dr. Gregg regarding use of Briviact  for seizure prevention - will keep you updated via MyChart   Follow-up with Dr. Onita as scheduled next month for repeat Xeomin  injection  Continue aspirin  and atorvastatin  for secondary stroke prevention measures and continued close follow-up with PCP for aggressive stroke risk factor management     Follow-up in 6 months or call earlier if needed     Thank you for coming to see us  at [facility]. I hope we have been able to provide you high quality care today.  You may receive a patient satisfaction survey over the next few weeks. We would appreciate your feedback and comments so that we may continue to improve ourselves and the health of our patients.

## 2023-06-07 NOTE — Addendum Note (Signed)
 Addended by: Cooper Render on: 06/07/2023 09:38 AM   Modules accepted: Orders

## 2023-06-07 NOTE — Telephone Encounter (Signed)
 PV and colonoscopy have been cancelled patient and his wife made aware of intubation status and that someone from the office would contact them once an apt slot became available.

## 2023-06-08 ENCOUNTER — Other Ambulatory Visit: Payer: Self-pay | Admitting: Adult Health

## 2023-06-08 DIAGNOSIS — Z5181 Encounter for therapeutic drug level monitoring: Secondary | ICD-10-CM

## 2023-06-08 DIAGNOSIS — R7989 Other specified abnormal findings of blood chemistry: Secondary | ICD-10-CM

## 2023-06-08 LAB — COMPREHENSIVE METABOLIC PANEL
ALT: 23 [IU]/L (ref 0–44)
AST: 45 [IU]/L — ABNORMAL HIGH (ref 0–40)
Albumin: 4.2 g/dL (ref 4.1–5.1)
Alkaline Phosphatase: 123 [IU]/L — ABNORMAL HIGH (ref 44–121)
BUN/Creatinine Ratio: 16 (ref 9–20)
BUN: 14 mg/dL (ref 6–24)
Bilirubin Total: 0.8 mg/dL (ref 0.0–1.2)
CO2: 26 mmol/L (ref 20–29)
Calcium: 9.4 mg/dL (ref 8.7–10.2)
Chloride: 103 mmol/L (ref 96–106)
Creatinine, Ser: 0.87 mg/dL (ref 0.76–1.27)
Globulin, Total: 2.6 g/dL (ref 1.5–4.5)
Glucose: 153 mg/dL — ABNORMAL HIGH (ref 70–99)
Potassium: 3.8 mmol/L (ref 3.5–5.2)
Sodium: 145 mmol/L — ABNORMAL HIGH (ref 134–144)
Total Protein: 6.8 g/dL (ref 6.0–8.5)
eGFR: 106 mL/min/{1.73_m2} (ref >59)

## 2023-06-08 LAB — CBC WITH DIFFERENTIAL/PLATELET
Basophils Absolute: 0 10*3/uL (ref 0.0–0.2)
Basos: 0 %
EOS (ABSOLUTE): 0.1 10*3/uL (ref 0.0–0.4)
Eos: 2 %
Hematocrit: 38.9 % (ref 37.5–51.0)
Hemoglobin: 13.2 g/dL (ref 13.0–17.7)
Immature Grans (Abs): 0 10*3/uL (ref 0.0–0.1)
Immature Granulocytes: 0 %
Lymphocytes Absolute: 1.9 10*3/uL (ref 0.7–3.1)
Lymphs: 45 %
MCH: 30.7 pg (ref 26.6–33.0)
MCHC: 33.9 g/dL (ref 31.5–35.7)
MCV: 91 fL (ref 79–97)
Monocytes Absolute: 0.3 10*3/uL (ref 0.1–0.9)
Monocytes: 7 %
Neutrophils Absolute: 2 10*3/uL (ref 1.4–7.0)
Neutrophils: 46 %
Platelets: 175 10*3/uL (ref 150–450)
RBC: 4.3 x10E6/uL (ref 4.14–5.80)
RDW: 14.4 % (ref 11.6–15.4)
WBC: 4.3 10*3/uL (ref 3.4–10.8)

## 2023-06-08 LAB — VALPROIC ACID LEVEL: Valproic Acid Lvl: 58 ug/mL (ref 50–100)

## 2023-06-08 NOTE — Telephone Encounter (Signed)
 Will note this on the PV chart

## 2023-06-09 ENCOUNTER — Encounter: Payer: Self-pay | Admitting: Adult Health

## 2023-06-18 ENCOUNTER — Ambulatory Visit (AMBULATORY_SURGERY_CENTER): Payer: BC Managed Care – PPO

## 2023-06-18 VITALS — Ht 73.0 in | Wt 180.0 lb

## 2023-06-18 DIAGNOSIS — Z1211 Encounter for screening for malignant neoplasm of colon: Secondary | ICD-10-CM

## 2023-06-18 MED ORDER — NA SULFATE-K SULFATE-MG SULF 17.5-3.13-1.6 GM/177ML PO SOLN: 1.0000 | Freq: Once | ORAL | 0 refills | Status: AC

## 2023-06-18 NOTE — Progress Notes (Signed)
No egg or soy allergy known to patient  No issues known to pt with past sedation with any surgeries or procedures Patient denies ever being told they had issues or difficulty with intubation  No FH of Malignant Hyperthermia Pt is not on diet pills Pt is not on  home 02  Pt is not on blood thinners  Pt denies issues with constipation  No A fib or A flutter Have any cardiac testing pending--No L side paralysis, walks with assistance Pt denies use of chewing tobacco Discussed diabetic I weight loss medication holds Discussed NSAID holds Checked BMI Pt instructed to use Singlecare.com or GoodRx for a price reduction on prep  Patient's chart reviewed by Cathlyn Parsons CNRA prior to previsit and patient appropriate for the [facility].  Pre visit completed and red dot placed by patient's name on their procedure day (on provider's schedule).

## 2023-06-22 ENCOUNTER — Encounter: Payer: Self-pay | Admitting: Gastroenterology

## 2023-06-23 ENCOUNTER — Encounter (HOSPITAL_COMMUNITY): Payer: Self-pay | Admitting: Gastroenterology

## 2023-06-24 ENCOUNTER — Encounter: Payer: Self-pay | Admitting: Gastroenterology

## 2023-06-29 NOTE — Telephone Encounter (Signed)
Inbound call from patient's wife, wishing to reschedule procedure at [facility]. She states she has pneumonia and will not be able to bring him.

## 2023-06-30 ENCOUNTER — Ambulatory Visit: Payer: BC Managed Care – PPO | Admitting: Neurology

## 2023-06-30 NOTE — Telephone Encounter (Signed)
 Colonoscopy has been rescheduled to Tuesday, 08/19/23 at 10 am Geroge Long). I informed pt's wife that I will send updated instructions via MyChart and will place a copy in the mail. Pt's wife verbalized understanding and had no concerns at the end of the call.

## 2023-07-02 ENCOUNTER — Telehealth: Payer: Self-pay

## 2023-07-02 ENCOUNTER — Other Ambulatory Visit: Payer: Self-pay

## 2023-07-02 ENCOUNTER — Encounter: Payer: Self-pay | Admitting: Internal Medicine

## 2023-07-02 MED ORDER — AMLODIPINE BESYLATE 5 MG PO TABS: 5.0000 mg | ORAL_TABLET | Freq: Every day | ORAL | 1 refills | Status: DC

## 2023-07-02 NOTE — Telephone Encounter (Signed)
 Darians wife called stating his bp readings have been low, and she is concerned.  07/01/23 76/68 07/02/23 82/62  I showed this to Dr Fleeta Finger and he wants to change his amlodipine  from 10mg  to 5mg  and monitor.  Patient was told to monitor after the change and to call back if she needs to.

## 2023-07-13 ENCOUNTER — Encounter: Payer: BC Managed Care – PPO | Admitting: Gastroenterology

## 2023-07-14 ENCOUNTER — Ambulatory Visit: Payer: BC Managed Care – PPO | Admitting: Neurology

## 2023-07-17 ENCOUNTER — Other Ambulatory Visit: Payer: Self-pay | Admitting: Neurology

## 2023-07-19 NOTE — Telephone Encounter (Signed)
 Last seen on 06/07/23 Follow up scheduled on 07/21/23

## 2023-07-21 ENCOUNTER — Ambulatory Visit: Payer: BC Managed Care – PPO | Admitting: Neurology

## 2023-07-21 DIAGNOSIS — I69354 Hemiplegia and hemiparesis following cerebral infarction affecting left non-dominant side: Secondary | ICD-10-CM | POA: Diagnosis not present

## 2023-07-21 MED ORDER — INCOBOTULINUMTOXINA 100 UNITS IM SOLR
600.0000 [IU] | Freq: Once | INTRAMUSCULAR | Status: AC
  Administered 2023-07-28: 600 [IU] via INTRAMUSCULAR

## 2023-07-21 NOTE — Progress Notes (Signed)
 xeomin 100 units x 6 vial  [id number] [id number] X 1 VIAL Exp-01/2024  Lot- [id number] X 5 VIALS Exp- 05/2025  B/B  Bacteriostatic 0.9% Sodium Chloride- 12 mL  GNF:[id number] Expiration: 03/25/2024 NDC: 6578-4696-29 Dx: B28.413 WITNESSED KG:[id number] J RN

## 2023-07-21 NOTE — Progress Notes (Signed)
 Chief Complaint  Patient presents with   Injections    Pt in 14,  Pt is here for Xeomin injections for spastic hemiplegia of the left side.       ASSESSMENT AND PLAN  Jacob [name] is a 48 y.o. male   Large right MCA stroke in April 2023, with residual spastic left hemiparesis,  Significant pain of left shoulder, elbow, finger upon passive stretch, limited range of motion,  Electrical stimulation guided xeomin injection for spastic left hemiparesis, Used xeomin 600 units (100 units / 2 cc of normal saline), Focused on injections to spastic left upper extremity today  Left pectoralis major 100 units(divided into 4 injection sites) Left latissimus dorsi 100 units (divided into 4 injection sites) Left pronator teres 100 units Left brachialis 100 units Left flexor digitorum profundus 50 units Left flexor digitorum superficialis 50 units Left palmaris longus 50 units Left brachial radialis 50 units    DIAGNOSTIC DATA (LABS, IMAGING, TESTING) - I reviewed patient records, labs, notes, testing and imaging myself where available.   MEDICAL HISTORY:  Jacob [name] is a 48 year old right-handed male accompanied by his wife,, seen in request by vascular specialist Dr.   Pearlean Brownie, [facility] for evaluation of botulism toxin injection for spastic left hemiparesis,  I reviewed and summarized the referring note.  Past medical history Hypertension Diabetes HLD  Patient suffered large right MCA stroke in April 2023, CTA showed right M1 occlusion,  status post IV TNK followed by mechanical thrombectomy with revascularized with TICI 3 flow established, however patient developed a large right hemisphere infarction significant cytotoxic cerebral edema postprocedure, taken for emergent decompression having craniectomy by Dr. Lovell Sheehan required tracheostomy for respiratory failure, DVT in right peroneal vein, was treated with Eliquis, TEE showed small intrapulmonary shunt, hemoglobin 8.1 LDL  96,  Seizure September 16, 2022, difficulty tolerating. Keppra, switched to Briviact doing well, no recurrent seizure, EEG showed focal right hemisphere slowing,  Depression anxiety taking Lexapro,  Repeat Doppler study in January 2024 showed resolution of DVT Eliquis was stopped, switched to aspirin 81 mg daily,  He is now back at home, taking few steps his assistant, significant spastic weakness of left upper and lower extremity, painful left shoulder hand upon passive stretch,  UPDATE Feb 26th 2025: He did well with previous injection, no significant side effect noted  PHYSICAL EXAM:    MOTOR: Spastic left hemiparesis, 0 out of 5 for left upper extremity, maximum range of motion of left shoulder at 90 degree, tight left pectoralis muscle, latissimus dorsi, painful with passive stretch, elbow flexion, pronation, thumb in position, painful fingers upon passive stretch  Left lower extremity 1 out of 5, limited range of motion of left knee, maximum flexion 150 degree, tight left hamstring, painful with stretch,  REFLEXES: Hyperreflexia on the left side  SENSORY: Intact to light touch, pinprick and vibratory sensation are intact in fingers and toes.  COORDINATION: There is no trunk or limb dysmetria noted.  GAIT/STANCE: Need assistant to get up from seated position, difficulty picking up her left leg, on the left foot, dragging left side  REVIEW OF SYSTEMS:  Full 14 system review of systems performed and notable only for as above All other review of systems were negative.   ALLERGIES: Allergies  Allergen Reactions   Hydrochlorothiazide Other (See Comments)    Bilateral muscle cramping    HOME MEDICATIONS: Current Outpatient Medications  Medication Sig Dispense Refill   amLODipine (NORVASC) 5 MG tablet Take 1 tablet (5 mg  total) by mouth daily. 30 tablet 1   aspirin EC 81 MG tablet Take 1 tablet (81 mg total) by mouth daily. Swallow whole. 30 tablet 12   atorvastatin (LIPITOR)  40 MG tablet TAKE 2 TABLETS (80 MG TOTAL) BY MOUTH AT BEDTIME. 180 tablet 0   baclofen (LIORESAL) 10 MG tablet TAKE 1 TABLET BY MOUTH THREE TIMES A DAY 90 tablet 3   blood glucose meter kit and supplies KIT Dispense based on patient and insurance preference. Use up to four times daily as directed. 1 each 0   Brivaracetam (BRIVIACT) 50 MG TABS Take 50 mg by mouth in the morning and at bedtime. 180 tablet 3   clotrimazole-betamethasone (LOTRISONE) cream APPLY 1 APPLICATION TOPICALLY DAILY 30 g 0   divalproex (DEPAKOTE ER) 500 MG 24 hr tablet TAKE 1 TABLET (500 MG TOTAL) BY MOUTH DAILY. 30 tablet 0   escitalopram (LEXAPRO) 20 MG tablet TAKE 1 AND 1/2 TABLETS BY MOUTH DAILY 45 tablet 2   glucose blood (RELION TRUE METRIX TEST STRIPS) test strip Use as instructed 100 each 12   insulin glargine (LANTUS) 100 UNIT/ML injection Inject 0.1 mLs (10 Units total) into the skin daily. (Patient taking differently: Inject 12 Units into the skin daily.) 10 mL 0   insulin lispro (HUMALOG KWIKPEN) 100 UNIT/ML KwikPen INJECT 0-20 UNITS INTO THE SKIN 3 (THREE) TIMES DAILY. INJECT 0-20 UNITS INTO THE SKIN 3 (THREE) TIMES DAILY WITH MEALS. CBG < 70 CALL MD. CBG 70-120 GIVE 0 UNITS 121-150 GIVE 3 UNITS 151-200 GIVE 4 UNITS, 201-250 GIVE 7 UNITS 251-300 GIVE 11 UNITS 301-350 GIVE 15 UNITS 351-400 GIVE 20 UNITS GREATER THAN 400 CALL MD 15 mL 1   Lancets Ultra Fine MISC Use as directed 100 each 11   lisinopril (ZESTRIL) 40 MG tablet TAKE 1 TABLET BY MOUTH EVERY DAY 90 tablet 1   metFORMIN (GLUCOPHAGE) 500 MG tablet Take 1 tablet (500 mg total) by mouth 2 (two) times daily with a meal. 180 tablet 1   metoprolol tartrate (LOPRESSOR) 25 MG tablet TAKE 1 TABLET BY MOUTH TWICE A DAY 180 tablet 3   sildenafil (VIAGRA) 50 MG tablet Take 1 tablet (50 mg total) by mouth daily as needed for erectile dysfunction. 10 tablet 2   traZODone (DESYREL) 50 MG tablet TAKE 1 TABLET BY MOUTH EVERYDAY AT BEDTIME 90 tablet 1   Current  Facility-Administered Medications  Medication Dose Route Frequency Provider Last Rate Last Admin   incobotulinumtoxinA (XEOMIN) 100 units injection 600 Units  600 Units Intramuscular Once Levert Feinstein, MD        PAST MEDICAL HISTORY: Past Medical History:  Diagnosis Date   Allergy    Depression    Diabetes mellitus without complication (HCC)    GERD (gastroesophageal reflux disease)    Hypertension    Paralysis (HCC)    Seizures (HCC)    Stroke [facility])     PAST SURGICAL HISTORY: Past Surgical History:  Procedure Laterality Date   APPENDECTOMY     BUBBLE STUDY  10/02/2021   Procedure: BUBBLE STUDY;  Surgeon: Pricilla Riffle, MD;  Location: [facility] ENDOSCOPY;  Service: Cardiovascular;;   CRANIOPLASTY Right 02/09/2022   Procedure: Craniectomy with Replacement of Bone Flap From the Abdomen;  Surgeon: Tressie Stalker, MD;  Location: [facility] OR;  Service: Neurosurgery;  Laterality: Right;   CRANIOTOMY Right 09/15/2021   Procedure: RIGHT DECOMPRESSIVE CRANIOTOMY, PLACEMENT OF BONE FLAP IN ABDOMEN;  Surgeon: Tressie Stalker, MD;  Location: [facility] OR;  Service: Neurosurgery;  Laterality: Right;   IR CT HEAD LTD  09/12/2021   IR PERCUTANEOUS ART THROMBECTOMY/INFUSION INTRACRANIAL INC DIAG ANGIO  09/12/2021   IR US GUIDE VASC ACCESS RIGHT  09/12/2021   LAPAROSCOPIC APPENDECTOMY  10/24/2013   Procedure: APPENDECTOMY LAPAROSCOPIC;  Surgeon: Kandis Cocking, MD;  Location: [facility] ORS;  Service: General;;   RADIOLOGY WITH ANESTHESIA N/A 09/12/2021   Procedure: IR WITH ANESTHESIA;  Surgeon: Julieanne Cotton, MD;  Location: [facility] OR;  Service: Radiology;  Laterality: N/A;   TEE WITHOUT CARDIOVERSION N/A 10/02/2021   Procedure: TRANSESOPHAGEAL ECHOCARDIOGRAM (TEE);  Surgeon: Pricilla Riffle, MD;  Location: [facility] ENDOSCOPY;  Service: Cardiovascular;  Laterality: N/A;    FAMILY HISTORY: Family History  Problem Relation Age of Onset   Asthma Son    Stroke Neg Hx    Colon cancer Neg Hx    Rectal cancer Neg Hx    Stomach cancer Neg  Hx    Esophageal cancer Neg Hx     SOCIAL HISTORY: Social History   Socioeconomic History   Marital status: Married    Spouse name: Not on file   Number of children: Not on file   Years of education: Not on file   Highest education level: Not on file  Occupational History   Not on file  Tobacco Use   Smoking status: Never   Smokeless tobacco: Never  Vaping Use   Vaping status: Never Used  Substance and Sexual Activity   Alcohol use: Yes    Alcohol/week: 1.0 standard drink of alcohol    Types: 1 Cans of beer per week    Comment: OCCASIONALLY   Drug use: Yes    Types: MDMA (Ecstacy)   Sexual activity: Not on file  Other Topics Concern   Not on file  Social History Narrative   Lives at home with wife and son 51yr old   Right handed   Caffeine-8oz   Social Drivers of Health   Financial Resource Strain: Low Risk  (02/05/2022)   Overall Financial Resource Strain (CARDIA)    Difficulty of Paying Living Expenses: Not very hard  Food Insecurity: No Food Insecurity (03/18/2022)   Hunger Vital Sign    Worried About Running Out of Food in the Last Year: Never true    Ran Out of Food in the Last Year: Never true  Transportation Needs: No Transportation Needs (03/18/2022)   PRAPARE - Administrator, Civil Service (Medical): No    Lack of Transportation (Non-Medical): No  Physical Activity: Not on file  Stress: Not on file  Social Connections: Not on file  Intimate Partner Violence: Not on file      Levert Feinstein, M.D. Ph.D.  [facility] [address], [address] [place], [place] [address] Ph: [phone] Fax: [phone]  CC:  [name], MD [address] [place],  [place] [address]  [name], MD

## 2023-07-26 ENCOUNTER — Ambulatory Visit: Payer: BC Managed Care – PPO | Admitting: Internal Medicine

## 2023-07-26 ENCOUNTER — Encounter: Payer: Self-pay | Admitting: Internal Medicine

## 2023-07-26 VITALS — BP 116/72 | HR 94 | Temp 98.6°F | Resp 16 | Ht 73.0 in | Wt 165.3 lb

## 2023-07-26 DIAGNOSIS — R634 Abnormal weight loss: Secondary | ICD-10-CM | POA: Insufficient documentation

## 2023-07-26 MED ORDER — METFORMIN HCL 500 MG PO TABS: 500.0000 mg | ORAL_TABLET | Freq: Every day | ORAL | 3 refills | Status: DC

## 2023-07-26 MED ORDER — MIRTAZAPINE 7.5 MG PO TABS
7.5000 mg | ORAL_TABLET | Freq: Every day | ORAL | 2 refills | Status: DC
Start: 2023-07-26 — End: 2023-08-17

## 2023-07-26 NOTE — Assessment & Plan Note (Signed)
 Since she noticed the weight loss and decreased appetite since his metformin change.  We will cut his metformin back to once a day.  They have also noted his BP has been low at times and he was symptomatic with SBP 90's.  We will stop his amlodipine.  I am going to stop his trazodone for sleep and start him on mirtazapine 7.5mg  at bedtime for sleep and appetite.  We will see him back in 1 month for a weight recheck.  We did labs which were normal last month on his yearly exam.

## 2023-07-26 NOTE — Progress Notes (Signed)
 Office Visit  Subjective   Patient ID: Jacob [name]   DOB: Jul 02, 1975   Age: 48 y.o.   MRN: [id number]   Chief Complaint Chief Complaint  Patient presents with   Annual Exam    Not eating or drinking, wife states maybe 10 oz a day     History of Present Illness Jacob [name] returns today where he states he has no appetite and has been losing weight.  This started about 2 months ago but worsened over the last month.  His wife states that in 12/2022 he weighed 189 lbs.  His last visit was not an accurate weight here per his wife.  Today, he weights 165 lbs.  He states that he has no appetite and will take a few bites.  He states he does not feel depressed and his wife is not sure about this.  The patient remains on lexapro 20mg  daily, trazodone 50mg  daily and depakote ER 500mg  daily.  His wife also noted that his appetite went away when we went up on his dose of metformin this past year.  He is taking glucerna at night but only drinks half of this.     Past Medical History Past Medical History:  Diagnosis Date   Allergy    Depression    Diabetes mellitus without complication (HCC)    GERD (gastroesophageal reflux disease)    Hypertension    Paralysis (HCC)    Seizures (HCC)    Stroke (HCC)      Allergies Allergies  Allergen Reactions   Hydrochlorothiazide Other (See Comments)    Bilateral muscle cramping     Medications  Current Outpatient Medications:    aspirin EC 81 MG tablet, Take 1 tablet (81 mg total) by mouth daily. Swallow whole., Disp: 30 tablet, Rfl: 12   atorvastatin (LIPITOR) 40 MG tablet, TAKE 2 TABLETS (80 MG TOTAL) BY MOUTH AT BEDTIME., Disp: 180 tablet, Rfl: 0   baclofen (LIORESAL) 10 MG tablet, TAKE 1 TABLET BY MOUTH THREE TIMES A DAY, Disp: 90 tablet, Rfl: 3   blood glucose meter kit and supplies KIT, Dispense based on patient and insurance preference. Use up to four times daily as directed., Disp: 1 each, Rfl: 0   Brivaracetam (BRIVIACT) 50 MG TABS, Take  50 mg by mouth in the morning and at bedtime., Disp: 180 tablet, Rfl: 3   clotrimazole-betamethasone (LOTRISONE) cream, APPLY 1 APPLICATION TOPICALLY DAILY, Disp: 30 g, Rfl: 0   divalproex (DEPAKOTE ER) 500 MG 24 hr tablet, TAKE 1 TABLET (500 MG TOTAL) BY MOUTH DAILY., Disp: 30 tablet, Rfl: 0   escitalopram (LEXAPRO) 20 MG tablet, TAKE 1 AND 1/2 TABLETS BY MOUTH DAILY, Disp: 45 tablet, Rfl: 2   glucose blood (RELION TRUE METRIX TEST STRIPS) test strip, Use as instructed, Disp: 100 each, Rfl: 12   insulin glargine (LANTUS) 100 UNIT/ML injection, Inject 0.1 mLs (10 Units total) into the skin daily. (Patient taking differently: Inject 12 Units into the skin daily.), Disp: 10 mL, Rfl: 0   insulin lispro (HUMALOG KWIKPEN) 100 UNIT/ML KwikPen, INJECT 0-20 UNITS INTO THE SKIN 3 (THREE) TIMES DAILY. INJECT 0-20 UNITS INTO THE SKIN 3 (THREE) TIMES DAILY WITH MEALS. CBG < 70 CALL MD. CBG 70-120 GIVE 0 UNITS 121-150 GIVE 3 UNITS 151-200 GIVE 4 UNITS, 201-250 GIVE 7 UNITS 251-300 GIVE 11 UNITS 301-350 GIVE 15 UNITS 351-400 GIVE 20 UNITS GREATER THAN 400 CALL MD, Disp: 15 mL, Rfl: 1   Lancets Ultra Fine MISC, Use as  directed, Disp: 100 each, Rfl: 11   lisinopril (ZESTRIL) 40 MG tablet, TAKE 1 TABLET BY MOUTH EVERY DAY, Disp: 90 tablet, Rfl: 1   metoprolol tartrate (LOPRESSOR) 25 MG tablet, TAKE 1 TABLET BY MOUTH TWICE A DAY, Disp: 180 tablet, Rfl: 3   sildenafil (VIAGRA) 50 MG tablet, Take 1 tablet (50 mg total) by mouth daily as needed for erectile dysfunction., Disp: 10 tablet, Rfl: 2  Current Facility-Administered Medications:    incobotulinumtoxinA (XEOMIN) 100 units injection 600 Units, 600 Units, Intramuscular, Once, Levert Feinstein, MD   Review of Systems Review of Systems  Constitutional:  Negative for chills and fever.  Respiratory:  Negative for shortness of breath.   Cardiovascular:  Negative for chest pain, palpitations and leg swelling.  Gastrointestinal:  Positive for constipation. Negative for  abdominal pain, diarrhea, heartburn, nausea and vomiting.  Neurological:  Negative for dizziness, weakness and headaches.       Objective:    Vitals BP 116/72   Pulse 94   Temp 98.6 F (37 C)   Resp 16   Ht 6\' 1"  (1.854 m)   Wt 165 lb 4.8 oz (75 kg)   SpO2 97%   BMI 21.81 kg/m    Physical Examination Physical Exam Constitutional:      Appearance: Normal appearance. He is not ill-appearing.  Cardiovascular:     Rate and Rhythm: Normal rate and regular rhythm.     Pulses: Normal pulses.     Heart sounds: No murmur heard.    No friction rub. No gallop.  Pulmonary:     Effort: Pulmonary effort is normal. No respiratory distress.     Breath sounds: No wheezing, rhonchi or rales.  Abdominal:     General: Abdomen is flat. Bowel sounds are normal. There is no distension.     Palpations: Abdomen is soft.     Tenderness: There is no abdominal tenderness.  Musculoskeletal:     Right lower leg: No edema.     Left lower leg: No edema.  Skin:    General: Skin is warm and dry.     Findings: No rash.  Neurological:     Mental Status: He is alert.        Assessment & Plan:   Weight loss Since she noticed the weight loss and decreased appetite since his metformin change.  We will cut his metformin back to once a day.  They have also noted his BP has been low at times and he was symptomatic with SBP 90's.  We will stop his amlodipine.  I am going to stop his trazodone for sleep and start him on mirtazapine 7.5mg  at bedtime for sleep and appetite.  We will see him back in 1 month for a weight recheck.  We did labs which were normal last month on his yearly exam.    Return in about 29 days (around 08/24/2023).   [name], MD

## 2023-07-27 ENCOUNTER — Other Ambulatory Visit: Payer: Self-pay | Admitting: Internal Medicine

## 2023-07-28 DIAGNOSIS — I69354 Hemiplegia and hemiparesis following cerebral infarction affecting left non-dominant side: Secondary | ICD-10-CM | POA: Diagnosis not present

## 2023-07-31 ENCOUNTER — Other Ambulatory Visit: Payer: Self-pay | Admitting: Internal Medicine

## 2023-08-09 ENCOUNTER — Other Ambulatory Visit: Payer: Self-pay | Admitting: Neurology

## 2023-08-09 DIAGNOSIS — G40909 Epilepsy, unspecified, not intractable, without status epilepticus: Secondary | ICD-10-CM

## 2023-08-11 ENCOUNTER — Telehealth: Payer: Self-pay | Admitting: Gastroenterology

## 2023-08-11 NOTE — Telephone Encounter (Signed)
 Procedure:Colonoscopy Procedure date: 08/19/23 Procedure location: [facility] Arrival Time: 8:30 am Spoke with the patient Y/N:   No, I left a detailed message for the patient to return call 08/11/23 @ 4:35 pm  No, I left a detailed message for the patient to return call  08/12/23 @ 2:03 pm  No, I left a detailed message for the patient to return call 08/13/23 @ 11:06 am Mychart message reminder sent 08/19/23 @ 3:07 pm  Any prep concerns? ___  Has the patient obtained the prep from the pharmacy ? ___ Do you have a care partner and transportation: ___ Any additional concerns? ___

## 2023-08-12 ENCOUNTER — Encounter (HOSPITAL_COMMUNITY): Payer: Self-pay | Admitting: Gastroenterology

## 2023-08-16 NOTE — Telephone Encounter (Signed)
 Never received a response from pt .

## 2023-08-17 ENCOUNTER — Other Ambulatory Visit: Payer: Self-pay | Admitting: Internal Medicine

## 2023-08-18 ENCOUNTER — Encounter (HOSPITAL_COMMUNITY): Payer: Self-pay | Admitting: Anesthesiology

## 2023-08-19 ENCOUNTER — Ambulatory Visit (HOSPITAL_COMMUNITY)
Admission: RE | Admit: 2023-08-19 | Payer: BC Managed Care – PPO | Source: Home / Self Care | Admitting: Gastroenterology

## 2023-08-19 SURGERY — COLONOSCOPY WITH PROPOFOL
Anesthesia: Monitor Anesthesia Care

## 2023-08-19 NOTE — Progress Notes (Signed)
 Received call from patients wife that he did not complete bowel prep and is going to have to reschedule. Told her that she would need to let the office know but I would let endoscopy know. Will remove off schedule for now.

## 2023-08-20 ENCOUNTER — Other Ambulatory Visit: Payer: Self-pay | Admitting: Neurology

## 2023-08-25 ENCOUNTER — Ambulatory Visit: Admitting: Internal Medicine

## 2023-08-25 ENCOUNTER — Other Ambulatory Visit: Payer: Self-pay | Admitting: Internal Medicine

## 2023-08-26 ENCOUNTER — Telehealth: Payer: Self-pay

## 2023-08-26 ENCOUNTER — Ambulatory Visit: Admitting: Internal Medicine

## 2023-08-26 NOTE — Telephone Encounter (Signed)
 Called patient and LM he needs an OV with [facility] to discuss rescheduling colonoscopy at [facility].  He must keep this appointment since he has no showed 2 hospital appts or we will no longer be able to schedule him. Asked patient to call back asap to get scheduled for OV

## 2023-09-06 ENCOUNTER — Encounter: Payer: Self-pay | Admitting: Internal Medicine

## 2023-09-06 ENCOUNTER — Ambulatory Visit: Admitting: Internal Medicine

## 2023-09-06 ENCOUNTER — Other Ambulatory Visit: Payer: Self-pay | Admitting: Internal Medicine

## 2023-09-06 VITALS — BP 124/70 | HR 68 | Temp 98.1°F | Resp 16 | Ht 73.0 in | Wt 167.0 lb

## 2023-09-06 DIAGNOSIS — R634 Abnormal weight loss: Secondary | ICD-10-CM

## 2023-09-06 DIAGNOSIS — E119 Type 2 diabetes mellitus without complications: Secondary | ICD-10-CM

## 2023-09-06 DIAGNOSIS — I10 Essential (primary) hypertension: Secondary | ICD-10-CM | POA: Diagnosis not present

## 2023-09-06 DIAGNOSIS — E7800 Pure hypercholesterolemia, unspecified: Secondary | ICD-10-CM | POA: Insufficient documentation

## 2023-09-06 DIAGNOSIS — R296 Repeated falls: Secondary | ICD-10-CM | POA: Insufficient documentation

## 2023-09-06 DIAGNOSIS — I69354 Hemiplegia and hemiparesis following cerebral infarction affecting left non-dominant side: Secondary | ICD-10-CM

## 2023-09-06 MED ORDER — CLONIDINE 0.3 MG/24HR TD PTWK: 0.3000 mg | MEDICATED_PATCH | TRANSDERMAL | 3 refills | Status: AC

## 2023-09-06 NOTE — Assessment & Plan Note (Signed)
We will check his HgBA1c at this time.

## 2023-09-06 NOTE — Assessment & Plan Note (Signed)
 We will check his FLP today.

## 2023-09-06 NOTE — Assessment & Plan Note (Signed)
 He has had some frequent falls and still has weakness of his left side.  He wants to see if he can get more outpatient therapy and we will refer him to [facility].

## 2023-09-06 NOTE — Progress Notes (Signed)
 Office Visit  Subjective   Patient ID: Jacob [name]   DOB: [birth year]   Age: 48 y.o.   MRN: [id number]   Chief Complaint Chief Complaint  Patient presents with   Follow-up     History of Present Illness Jacob [name]  is a 48 yo male who returns today for followup of his weight.  I saw him a little more than 4 weeks ago where he had no appetite and had been losing weight.  This started about 3 months ago but worsened over the previous month.  His wife states that in 12/2022 he weighed 189 lbs.  His last visit was not an accurate weight here per his wife.   He weighed 189 lbs in 05/2023 and 165 lbs in 07/2023.  Today he has gained 2 lbs over the last month at 167 lbs.  On his last visit, his wife noticed that his weight loss and decreased appetite started since his metformin change.  We cut his metformin back to once a day.  I also stopped trazodone for sleep and started him on mirtazapine.  His wife states that over the interim, she stopped the mirtazapine due to it making him too hungry and craving things he shouldn't eat.  He stated he did not feel depressed and his wife is not sure about this.  The patient remains on lexapro 20mg  daily and depakote ER 500mg  daily.   He was started back on trazodone 50mg  at bedtime by his wife and he states he is sleeping well.    The patient is a 48 year old male who returns for a follow-up visit of his T2 diabetes. He states he was diagnosed with diabetes in 2005.  On his last visit, his diabetes was not controlled and we did start him on metformin. We tried to get him started on [facility] but his insurance would not pay for that.  Since the last visit, there have been no problems. He is currently on:  Lantus 7 Units subcut daily, metformin 500mg  daily and humalog SSI TID which he uses rarely.  He specifically denies unexplained abdominal pain, nausea or vomiting. He is not checking his FSBS often but checks it once a day where he ranges 90-100.  His last HgBA1c was done  6 months ago and was 6.5%. He has no long term complications of diabetic retinopathy, nephropathy, or neuropathy.  He has not had his eyes examined by an optometrist.     The patient is a 48 year old male who presents for a follow-up evaluation of hypertension.  Since his last visit, there has been no problems.  The patient has been checking her blood pressure at home. The patient's blood pressure has ranged systollically 110-120's. The patient's current medications include:  metoprolol 25mg  BID,clonidine 0.3mg  patch weekly and lisinopril 40mg  daily. The patient has been tolerating her medications well. The patient denies any headache, visual changes, dizziness, lightheadness, chest pain, shortness of breath, weakness/numbness, and edema.   The patient also returns today for routine followup on his cholesterol. Overall, he states he is doing well and is without any complaints or problems at this time. He specifically denies nausea, vomiting, diarrhea, myalgias, and fatigue. He remains on atorvastatin 40mg  qhs. He is fasting in anticipation for labs today.   The patient is still having a few falls but has not had any injury.  He is using a motorized wheelchair and he uses a walker in the home.  He is still having  weakness on his left side.        Past Medical History Past Medical History:  Diagnosis Date   Allergy    Depression    Diabetes mellitus without complication (HCC)    GERD (gastroesophageal reflux disease)    Hypertension    Paralysis (HCC)    Seizures (HCC)    Stroke (HCC)      Allergies Allergies  Allergen Reactions   Hydrochlorothiazide Other (See Comments)    Bilateral muscle cramping     Medications  Current Outpatient Medications:    aspirin EC 81 MG tablet, Take 1 tablet (81 mg total) by mouth daily. Swallow whole., Disp: 30 tablet, Rfl: 12   atorvastatin (LIPITOR) 40 MG tablet, TAKE 2 TABLETS (80 MG TOTAL) BY MOUTH AT BEDTIME., Disp: 180 tablet, Rfl: 0    baclofen (LIORESAL) 10 MG tablet, TAKE 1 TABLET BY MOUTH THREE TIMES A DAY, Disp: 90 tablet, Rfl: 3   blood glucose meter kit and supplies KIT, Dispense based on patient and insurance preference. Use up to four times daily as directed., Disp: 1 each, Rfl: 0   Brivaracetam (BRIVIACT) 50 MG TABS, TAKE 50 MG BY MOUTH IN THE MORNING AND AT BEDTIME., Disp: 60 tablet, Rfl: 5   clotrimazole-betamethasone (LOTRISONE) cream, APPLY 1 APPLICATION TOPICALLY DAILY, Disp: 30 g, Rfl: 0   divalproex (DEPAKOTE ER) 500 MG 24 hr tablet, TAKE 1 TABLET BY MOUTH EVERY DAY, Disp: 30 tablet, Rfl: 3   escitalopram (LEXAPRO) 20 MG tablet, TAKE 1 TABLET BY MOUTH EVERY DAY, Disp: 90 tablet, Rfl: 1   glucose blood (RELION TRUE METRIX TEST STRIPS) test strip, Use as instructed, Disp: 100 each, Rfl: 12   insulin glargine (LANTUS) 100 UNIT/ML injection, Inject 0.1 mLs (10 Units total) into the skin daily. (Patient taking differently: Inject 12 Units into the skin daily.), Disp: 10 mL, Rfl: 0   insulin lispro (HUMALOG KWIKPEN) 100 UNIT/ML KwikPen, INJECT 0-20 UNITS INTO THE SKIN 3 (THREE) TIMES DAILY. INJECT 0-20 UNITS INTO THE SKIN 3 (THREE) TIMES DAILY WITH MEALS. CBG < 70 CALL MD. CBG 70-120 GIVE 0 UNITS 121-150 GIVE 3 UNITS 151-200 GIVE 4 UNITS, 201-250 GIVE 7 UNITS 251-300 GIVE 11 UNITS 301-350 GIVE 15 UNITS 351-400 GIVE 20 UNITS GREATER THAN 400 CALL MD, Disp: 15 mL, Rfl: 1   Lancets Ultra Fine MISC, Use as directed, Disp: 100 each, Rfl: 11   lisinopril (ZESTRIL) 40 MG tablet, TAKE 1 TABLET BY MOUTH EVERY DAY, Disp: 90 tablet, Rfl: 1   metFORMIN (GLUCOPHAGE) 500 MG tablet, Take 1 tablet (500 mg total) by mouth daily with breakfast., Disp: 90 tablet, Rfl: 3   metoprolol tartrate (LOPRESSOR) 25 MG tablet, TAKE 1 TABLET BY MOUTH TWICE A DAY, Disp: 180 tablet, Rfl: 3   mirtazapine (REMERON) 7.5 MG tablet, TAKE 1 TABLET BY MOUTH AT BEDTIME., Disp: 90 tablet, Rfl: 1   sildenafil (VIAGRA) 50 MG tablet, Take 1 tablet (50 mg total) by  mouth daily as needed for erectile dysfunction., Disp: 10 tablet, Rfl: 2   Review of Systems Review of Systems  Constitutional:  Negative for chills, fever and malaise/fatigue.  Eyes:  Negative for blurred vision and double vision.  Respiratory:  Negative for cough and shortness of breath.   Cardiovascular:  Negative for chest pain, palpitations and leg swelling.  Gastrointestinal:  Positive for constipation. Negative for abdominal pain, diarrhea, nausea and vomiting.  Genitourinary:  Negative for frequency.  Musculoskeletal:  Negative for myalgias.  Skin:  Negative for itching  and rash.  Neurological:  Negative for dizziness, weakness and headaches.  Endo/Heme/Allergies:  Negative for polydipsia.       Objective:    Vitals BP 124/70   Pulse 68   Temp 98.1 F (36.7 C)   Resp 16   Ht 6\' 1"  (1.854 m)   Wt 167 lb (75.8 kg)   SpO2 98%   BMI 22.03 kg/m    Physical Examination Physical Exam Constitutional:      Appearance: Normal appearance. He is not ill-appearing.  Cardiovascular:     Rate and Rhythm: Normal rate and regular rhythm.     Pulses: Normal pulses.     Heart sounds: No murmur heard.    No friction rub. No gallop.  Pulmonary:     Effort: Pulmonary effort is normal. No respiratory distress.     Breath sounds: No wheezing, rhonchi or rales.  Abdominal:     General: Abdomen is flat. Bowel sounds are normal. There is no distension.     Palpations: Abdomen is soft.     Tenderness: There is no abdominal tenderness.  Musculoskeletal:     Right lower leg: No edema.     Left lower leg: No edema.  Skin:    General: Skin is warm and dry.     Findings: No rash.  Neurological:     Mental Status: He is alert and oriented to person, place, and time.     Comments: He has left sided hemiparesis.  Psychiatric:        Mood and Affect: Mood normal.        Behavior: Behavior normal.        Assessment & Plan:   Essential hypertension He is now off amlodipine and his  BP is controlled.  We will continue to monitor.  Diabetes mellitus without complication (HCC) We will check his HgBA1c at this time.  Spastic hemiplegia of left nondominant side as late effect of cerebral infarction [facility]) He has had some frequent falls and still has weakness of his left side.  He wants to see if he can get more outpatient therapy and we will refer him to [facility].  Weight loss His weight loss may be related to his metformin.  His appetite is improved and he has stopped the mirtazapine.  We will continue to monitor his weights.  Hypercholesterolemia We will check his FLP today.  Falls We will get therapy as discussed above.    Return in about 3 months (around 12/06/2023) for annual.   Wayne Haines, MD

## 2023-09-06 NOTE — Assessment & Plan Note (Signed)
 He is now off amlodipine and his BP is controlled.  We will continue to monitor.

## 2023-09-06 NOTE — Assessment & Plan Note (Signed)
 His weight loss may be related to his metformin.  His appetite is improved and he has stopped the mirtazapine.  We will continue to monitor his weights.

## 2023-09-06 NOTE — Assessment & Plan Note (Signed)
 We will get therapy as discussed above.

## 2023-09-07 LAB — LIPID PANEL
Chol/HDL Ratio: 3 ratio (ref 0.0–5.0)
Cholesterol, Total: 133 mg/dL (ref 100–199)
HDL: 44 mg/dL (ref >39)
LDL Chol Calc (NIH): 69 mg/dL (ref 0–99)
Triglycerides: 112 mg/dL (ref 0–149)
VLDL Cholesterol Cal: 20 mg/dL (ref 5–40)

## 2023-09-07 LAB — HEMOGLOBIN A1C
Est. average glucose Bld gHb Est-mCnc: 108 mg/dL
Hgb A1c MFr Bld: 5.4 % (ref 4.8–5.6)

## 2023-09-07 LAB — CMP14 + ANION GAP
ALT: 9 IU/L (ref 0–44)
AST: 22 IU/L (ref 0–40)
Albumin: 3.7 g/dL — ABNORMAL LOW (ref 4.1–5.1)
Alkaline Phosphatase: 111 IU/L (ref 44–121)
Anion Gap: 16 mmol/L (ref 10.0–18.0)
BUN/Creatinine Ratio: 15 (ref 9–20)
BUN: 10 mg/dL (ref 6–24)
Bilirubin Total: 0.6 mg/dL (ref 0.0–1.2)
CO2: 22 mmol/L (ref 20–29)
Calcium: 9.1 mg/dL (ref 8.7–10.2)
Chloride: 106 mmol/L (ref 96–106)
Creatinine, Ser: 0.67 mg/dL — ABNORMAL LOW (ref 0.76–1.27)
Globulin, Total: 2.7 g/dL (ref 1.5–4.5)
Glucose: 121 mg/dL — ABNORMAL HIGH (ref 70–99)
Potassium: 4.8 mmol/L (ref 3.5–5.2)
Sodium: 144 mmol/L (ref 134–144)
Total Protein: 6.4 g/dL (ref 6.0–8.5)
eGFR: 114 mL/min/{1.73_m2} (ref >59)

## 2023-09-21 NOTE — Progress Notes (Signed)
 His labs look good. Diabetes and cholesterol are controlled.  Pt wife is aware of labs

## 2023-09-29 ENCOUNTER — Encounter: Payer: BC Managed Care – PPO | Admitting: Internal Medicine

## 2023-10-03 ENCOUNTER — Other Ambulatory Visit: Payer: Self-pay | Admitting: Internal Medicine

## 2023-10-17 ENCOUNTER — Other Ambulatory Visit: Payer: Self-pay | Admitting: Internal Medicine

## 2023-10-19 ENCOUNTER — Encounter: Admitting: Internal Medicine

## 2023-10-20 ENCOUNTER — Ambulatory Visit: Payer: BC Managed Care – PPO | Admitting: Neurology

## 2023-11-03 ENCOUNTER — Other Ambulatory Visit: Payer: Self-pay | Admitting: Internal Medicine

## 2023-11-19 ENCOUNTER — Encounter (HOSPITAL_COMMUNITY): Payer: Self-pay | Admitting: Interventional Radiology

## 2023-12-01 ENCOUNTER — Other Ambulatory Visit: Payer: Self-pay | Admitting: Internal Medicine

## 2023-12-01 ENCOUNTER — Other Ambulatory Visit: Payer: Self-pay | Admitting: Neurology

## 2023-12-05 ENCOUNTER — Other Ambulatory Visit: Payer: Self-pay | Admitting: Internal Medicine

## 2023-12-07 ENCOUNTER — Other Ambulatory Visit: Payer: Self-pay | Admitting: Internal Medicine

## 2023-12-07 ENCOUNTER — Other Ambulatory Visit: Payer: Self-pay

## 2023-12-07 MED ORDER — INSULIN GLARGINE 100 UNIT/ML ~~LOC~~ SOLN: 10.0000 [IU] | Freq: Every day | SUBCUTANEOUS | Status: DC

## 2023-12-07 NOTE — Progress Notes (Signed)
 Rx refill

## 2023-12-09 ENCOUNTER — Encounter: Admitting: Internal Medicine

## 2023-12-09 ENCOUNTER — Ambulatory Visit: Admitting: Internal Medicine

## 2023-12-13 ENCOUNTER — Encounter: Payer: Self-pay | Admitting: Adult Health

## 2023-12-13 ENCOUNTER — Ambulatory Visit: Payer: BC Managed Care – PPO | Admitting: Adult Health

## 2023-12-13 NOTE — Progress Notes (Deleted)
 [facility] [address] [place]. [place] [address] [phone]       OFFICE FOLLOW-UP NOTE  Jacob [name] Date of Birth:  1975-12-27 Medical Record Number:  [id number]   Primary neurologist: Dr. Rosemarie Reason for visit stroke and seizure follow-up   No chief complaint on file.     HPI:   Update 12/13/2023 JM: Patient returns for 61-month follow-up accompanied by his wife.  Overall stable from stroke standpoint without new stroke/TIA symptoms.  Residual left spastic hemiparesis stable.  Received Xeomin  injection by Jacob [name] 2/26 ***.  Continues to transfer via wheelchair, able to stand/pivot for transfers.  Remains on Briviact  without any additional seizures.  Also remains on Depakote  for behaviors/mood which has been stable.  Reports compliance on aspirin  and atorvastatin .  Routinely follows with PCP for stroke risk factor management.      History provided for reference purposes only Update 06/06/2022 JM: Patient returns for follow-up visit accompanied by his wife who provides majority of history.  At prior visit, started on Depakote  ER 500 mg night for concern of aggressive behavior. Continued on Briviact  for seizure prevention.   Overall he has been doing well.  Notes improvement of behavior since starting Depakote , has been tolerating without side effects.  Also remains on Briviact  50 mg twice daily.  No seizure activity. Continued left spastic hemiplegia, s/p Xeomin  injection by Jacob [name] on on 10/30, notes some improvement of arm tightness, repeat injection scheduled 2/5.  Primarily transfers via wheelchair, is able to stand/pivot for transfers.  Able to lift his left leg but difficulty bearing weight.  He does mention having issues with erectile dysfunction, has been prescribed sildenafil  by PCP but caused headache per wife. No new stroke/TIA symptoms.  Compliant on aspirin  and atorvastatin .  Routinely follows with PCP for stroke risk factor  management.  Update 03/23/2023 Dr. Rosemarie : Patient is worked into my schedule today upon request from wife.  He has been having some increasingly belligerent, aggressive and impulsive behavior.  1 day he insisted on getting into his car and driving.  The day tried to get a gun into his house.  He can be difficult to control when he gets into such moods.  Fortunately they do not last long.  He has not been violent or hit anybody yet.  The wife states that these behaviors were noted immediately after his stroke in April of last year but seem to have gotten worse recently.  He was started on Keppra  initially for seizures in April of this year but had some side effects and was switched to Briviact  but is only on a low-dose 50 mg twice daily and has done well and has been seizure-free.  Continues to have significant spastic left hemiplegia and is able to walk with a hemiwalker but with a safety belt and one-person assist.  He has seen Jacob [name] for Xeomin  injections and is awaiting insurance approval to start them soon.  He has not had any recurrent stroke or TIA symptoms.  He is tolerating aspirin  well without side effects.  His blood pressure and cholesterol have been under good control.  Lab work on 01/21/2023 showed LDL cholesterol 37 mg percent and hemoglobin A1c 6.5%.  Update  11/18/2022 Dr. Rosemarie: He returns for follow-up after last visit with me 6 months ago.  He was seen in the ER on 09/16/2022 with focal seizure on Keppra  100 twice daily.  Patient had trouble tolerating it is.  He was seen  in the office by Jacob [name] on 10/01/2022 and switch to Briviact  50 mg twice daily which she seems to be tolerating well without side effects.  EEG done showed focal right hemispheric slowing but no seizures.  He continues to have spastic left hemiplegia and is currently working with physical and Occupational Therapy at home.  He is barely able to stand and walks without hemiwalker with a 1 person assist.  He can help with  transfers.   He has some strength in his left shoulder and elbow but not in his hand or fingers.  He is able to lift his left leg against gravity but unable to bear weight.Jacob [name]  He is still on Eliquis  for lower extremity DVT but lower extremity Doppler study in January 2024 had shown resolution of DVT with no clot noted His blood pressure and sugars have been under good control.  His primary physician had increased his dose of levothyroxine last lipid profile on 09/05/2022 was satisfactory with LDL cholesterol 70 and hemoglobin A1c 7.0.  Update 05/06/2022 Dr. Rosemarie: Jacob [name] is a 48 year old Caucasian male seen today for initial office follow-up visit following hospital admission for stroke and April 2023.  He is accompanied by his wife.  History is obtained from them and review of electronic medical records.  I have personally reviewed pertinent available imaging films in PACS.Jacob [name] is a 48 y.o. male with a history of htn and DM who presents with left sided facial droop. Initially he had arm symptoms as well. He was normal at 1:30 am, then started having symptoms and woke his wife up. He described the symptoms as being that he was holding his phone, then suddenly could tnot tell he was holding his phone any more. He woke up  his wife who called 911. EMS noted some arm weakness and severe left facial droop.  He was brought in as a code stroke, and had significant improvement en route.   On initial examination, his arm symptoms had almost completely resolved(mild slowness on sequential opposition of digits, but no drift or ataxia). He still had mild dysarthria and facial weakness, but nothing that appeared disabling. TNK was discussed, but given the mildness of symptoms was not pursued. Over the the next few minutes, it was noted that he had some fluctuation of his symptoms, from mild to severe to mild dysarthria. CTA showed R M1 occlusion. In the setting of fluctuating symptoms which were disabling at  times, TNK was re-discussed and decision was made to pursue this. There was delay after this due to elevated blood pressures which needed to be treated.  Right MCA was revascularized with TICI 3 flow established.  Unfortunately the patient yet developed a large right hemispheric infarct and significant cytotoxic cerebral edema postprocedure was taken for emergent decompressive hemicraniectomy by Dr. Mavis with placement of the bone flap in the abdomen.  Left intubated for several days required tracheostomy to manage respiratory failure.  He developed DVT in the right peroneal vein for started on Eliquis  for this..  2D echo showed ejection fraction greater than 75%.  TEE showed small intrapulmonary shunt.  Hemoglobin A1c was 8.1.  LDL cholesterol is 96 mg percent.  Was transferred to LTAC and is currently living at home.  Getting home physical and occupational therapy 1 day a week.  He still has significant left hemiplegia function on the left.  He is able to stand with help of the therapist but not able to walk yet.  He can  hold onto the walker taking maybe 1 or 2 steps.  He is tolerating Eliquis  well without bruising or bleeding.  He has not yet had any follow-up venous Doppler studies.  To see if his DVT has resolved.  Since had his bone flap replaced on 02/09/2022 by Dr. Mavis follow-up CT scan of the head on 02/12/2022 showed stable appearance of his right MCA and PCA infarcts without acute abnormality.   Initial visit 12/08/2021 JM: Jacob [name] is a 48 y.o. male with a history of htn and DM who presents with left sided facial droop and left arm weakness.  Initially, significant improvement of symptoms upon route to ED with initial evaluation symptoms almost completely resolved therefore TNK not administered. CTH no acute abnormality, MRI showed tiny acute infarct right MCA.  But then noted fluctuation mild to severe dysarthria, right gaze preference, left hemiplegia and left sided sensory deficit. CTA  showed R M1 occlusion. Due to fluctuation of symptoms which were disabling at times, TNK administered after stabilization of blood pressure.  Taken for mechanical thrombectomy with TICI 3 revascularization. MRI showed right MCA territory infarct.  Repeat MRA 4/22 showed reoccluded right M2 branch and left M1 stenosis.  TEE consistent with intrapulmonary shunt.  Repeat CT head 4/24 as difficult to arouse which showed increase edema with new 12 mm midline shift and underwent decompressive hemicraniectomy with placement of bone flap and abdomen.  Repeat CT head stable.  Left intubated postprocedure and eventual placement of tracheostomy 5/1.  Developed DVT in right peroneal vein and placed on Eliquis .  Placed on atorvastatin  40 mg daily for LDL 96.  A1c 8.1 with diabetic regimen adjusted advised outpatient follow-up with PCP for further management.  Hospital course prolonged by dysphagia requiring TF and NPO status, respiratory failure, fever with leukocytosis with tracheal aspirate +MRSA and headaches.  He was eventually discharged to [facility] on 5/17.     Patient is being seen for initial hospital follow-up accompanied by his wife who provides majority of history.  Currently residing at [facility] care center.  Has been doing well without new stroke/TIA symptoms.  Continues working with PT/OT/SLP. Reports improvement of dysphagia, currently on modified diet without difficulty. Had trach tube removed beginning of June.  Continued right gaze preference but wife believes this has been improving. Continued dense left hemiplegia with sensory impairment. Denies headaches - on topamax  50mg  twice daily.    Did had f/u with neurosurgery approx 3 weeks ago  - currently awaiting for swelling to go down in order to have bone flap replaced.    Per SNF MAR review, compliant on Eliquis  and atorvastatin , denies side effects Blood pressure today 97/67 -monitored at facility and per wife, typically 120-130s.   Glucose levels monitored at facility which have been stable per wife on current insulin  regimen   No further concerns at this time.      ROS:   14 system review of systems is positive for those listed in HPI and all other systems negative  PMH:  Past Medical History:  Diagnosis Date   Allergy    Depression    Diabetes mellitus without complication (HCC)    GERD (gastroesophageal reflux disease)    Hypertension    Paralysis (HCC)    Seizures (HCC)    Stroke [facility])     Social History:  Social History   Socioeconomic History   Marital status: Married    Spouse name: Not on file   Number of children: Not on  file   Years of education: Not on file   Highest education level: Not on file  Occupational History   Not on file  Tobacco Use   Smoking status: Never   Smokeless tobacco: Never  Vaping Use   Vaping status: Never Used  Substance and Sexual Activity   Alcohol use: Yes    Alcohol/week: 1.0 standard drink of alcohol    Types: 1 Cans of beer per week    Comment: OCCASIONALLY   Drug use: Yes    Types: MDMA (Ecstacy)   Sexual activity: Not on file  Other Topics Concern   Not on file  Social History Narrative   Lives at home with wife and son 73yr old   Right handed   Caffeine-8oz   Social Drivers of Health   Financial Resource Strain: Low Risk  (02/05/2022)   Overall Financial Resource Strain (CARDIA)    Difficulty of Paying Living Expenses: Not very hard  Food Insecurity: No Food Insecurity (03/18/2022)   Hunger Vital Sign    Worried About Running Out of Food in the Last Year: Never true    Ran Out of Food in the Last Year: Never true  Transportation Needs: No Transportation Needs (03/18/2022)   PRAPARE - Administrator, Civil Service (Medical): No    Lack of Transportation (Non-Medical): No  Physical Activity: Not on file  Stress: Not on file  Social Connections: Not on file  Intimate Partner Violence: Not on file    Medications:    Current Outpatient Medications on File Prior to Visit  Medication Sig Dispense Refill   ACCU-CHEK GUIDE TEST test strip USE AS INSTRUCTED 100 strip 12   Accu-Chek Softclix Lancets lancets TEST IN THE AM AND BEFORE MEALS 100 each 11   aspirin  EC 81 MG tablet Take 1 tablet (81 mg total) by mouth daily. Swallow whole. 30 tablet 12   atorvastatin  (LIPITOR) 40 MG tablet TAKE 2 TABLETS (80 MG TOTAL) BY MOUTH AT BEDTIME. 180 tablet 0   baclofen  (LIORESAL ) 10 MG tablet TAKE 1 TABLET BY MOUTH THREE TIMES A DAY 90 tablet 3   blood glucose meter kit and supplies KIT Dispense based on patient and insurance preference. Use up to four times daily as directed. 1 each 0   Brivaracetam  (BRIVIACT ) 50 MG TABS TAKE 50 MG BY MOUTH IN THE MORNING AND AT BEDTIME. 60 tablet 5   cloNIDine  (CATAPRES  - DOSED IN MG/24 HR) 0.3 mg/24hr patch Place 1 patch (0.3 mg total) onto the skin once a week. 12 patch 3   clotrimazole -betamethasone  (LOTRISONE ) cream APPLY 1 APPLICATION TOPICALLY DAILY 30 g 0   divalproex  (DEPAKOTE  ER) 500 MG 24 hr tablet TAKE 1 TABLET BY MOUTH EVERY DAY 30 tablet 3   escitalopram  (LEXAPRO ) 20 MG tablet TAKE 1 TABLET BY MOUTH EVERY DAY 90 tablet 1   insulin  glargine (LANTUS ) 100 UNIT/ML injection Inject 0.1 mLs (10 Units total) into the skin daily.     insulin  lispro (HUMALOG  KWIKPEN) 100 UNIT/ML KwikPen INJECT 0-20 UNITS INTO THE SKIN 3 (THREE) TIMES DAILY. INJECT 0-20 UNITS INTO THE SKIN 3 (THREE) TIMES DAILY WITH MEALS. CBG < 70 CALL MD. CBG 70-120 GIVE 0 UNITS 121-150 GIVE 3 UNITS 151-200 GIVE 4 UNITS, 201-250 GIVE 7 UNITS 251-300 GIVE 11 UNITS 301-350 GIVE 15 UNITS 351-400 GIVE 20 UNITS GREATER THAN 400 CALL MD 15 mL 1   lisinopril  (ZESTRIL ) 40 MG tablet TAKE 1 TABLET BY MOUTH EVERY DAY 90 tablet 1  metFORMIN  (GLUCOPHAGE ) 500 MG tablet Take 1 tablet (500 mg total) by mouth daily with breakfast. 90 tablet 3   metoprolol  tartrate (LOPRESSOR ) 25 MG tablet TAKE 1 TABLET BY MOUTH TWICE A DAY 180 tablet 3    mirtazapine  (REMERON ) 7.5 MG tablet TAKE 1 TABLET BY MOUTH AT BEDTIME. 90 tablet 1   sildenafil  (VIAGRA ) 50 MG tablet Take 1 tablet (50 mg total) by mouth daily as needed for erectile dysfunction. 10 tablet 2   traZODone  (DESYREL ) 50 MG tablet TAKE 1 TABLET BY MOUTH EVERYDAY AT BEDTIME 90 tablet 1   No current facility-administered medications on file prior to visit.    Allergies:   Allergies  Allergen Reactions   Hydrochlorothiazide  Other (See Comments)    Bilateral muscle cramping    Physical Exam There were no vitals filed for this visit.  There is no height or weight on file to calculate BMI.  General: Frail middle-aged African-American male, seated, in no evident distress Head: head normocephalic and atraumatic.  Neck: supple with no carotid or supraclavicular bruits Cardiovascular: regular rate and rhythm, no murmurs Musculoskeletal: no deformity large surgical scar on the scalp from right hemicraniectomy Skin:  no rash/petichiae Vascular:  Normal pulses all extremities  Neurologic Exam Mental Status: Awake and fully alert.  Mild dysarthria.  Oriented to place and time. Recent memory impaired and remote memory intact. Attention span, concentration and fund of knowledge mildly impaired with wife provided majority of history. Mood and affect appropriate.  Cranial Nerves: Pupils equal, briskly reactive to light. Extraocular movements full without nystagmus. Visual fields show dense left homonymous hemianopsia to confrontation. Hearing intact. Facial sensation intact.  Left lower face weakness., tongue, palate moves normally and symmetrically.  Motor: Spastic left hemiplegia with left upper extremity 1/5 proximal, 0/5 distal and left lower extremity 2/5 proximal and 0/5 distal.  Tone is increased on the left with spasticity.. Sensory.: intact to touch ,pinprick .position and vibratory sensation.  Coordination: Normal on the right and unable to test on the left.   Gait and  Station: Unable to test as patient is nonambulatory  reflexes: 2+ left upper and lower extremity, 1+ right upper and lower extremity     11/18/2022    2:21 PM  MMSE - Mini Mental State Exam  Orientation to time 4  Orientation to Place 5  Registration 3  Attention/ Calculation 5  Recall 3  Language- name 2 objects 2  Language- repeat 1  Language- follow 3 step command 3  Language- read & follow direction 1  Write a sentence 1  Copy design 0  Total score 28       ASSESSMENT: 48 year old African-American male with large right MCA infarct in 08/2021 secondary to right MCA occlusion s/p IV thrombolysis with TNK followed by mechanical thrombectomy with successful revascularization with patient developing malignant cytotoxic edema requiring emergent hemicraniectomy. Stroke etiology cryptogenic versus intracranial stenosis with residual left spastic hemiplegia.  He had symptomatic seizures on 09/16/2022 and was initially started on Keppra  but switched to Briviact  due to side effects but is now having increase impulsivity and aggressive behavior therefore started on Depakote  in 02/2023 with improvement of behaviors.    PLAN:  -Continue Depakote  ER 500 mg nightly - will check lab work -Valproic acid  level 58 (05/2023), recent CMP WNL -Continue to follow with Jacob [name] for Xeomin  injections for spasticity -Continue aspirin  81 mg daily and atorvastatin  80 mg daily for secondary stroke prevention measures managed/prescribed by PCP -Continue close PCP follow-up for aggressive  stroke risk factor management including BP goal<130/90, HLD with LDL goal<70 and DM with A1c.<7  -Stroke labs 08/2023: LDL 69, A1c 5.4 -Advised to follow-up with PCP regarding erectile dysfunction concerns    Follow up in 6 months or call earlier if needed   I personally spent a total of *** minutes in the care of the patient today including {Time Based Coding:210964241}.    Harlene Bogaert, AGNP-BC  [facility] [address] [place], [place] [address]  Phone [phone] Fax [phone] Note: This document was prepared with digital dictation and possible smart phrase technology. Any transcriptional errors that result from this process are unintentional.

## 2023-12-14 ENCOUNTER — Other Ambulatory Visit: Payer: Self-pay | Admitting: Neurology

## 2023-12-15 ENCOUNTER — Other Ambulatory Visit: Payer: Self-pay | Admitting: *Deleted

## 2023-12-15 MED ORDER — DIVALPROEX SODIUM ER 500 MG PO TB24: 500.0000 mg | ORAL_TABLET | Freq: Every day | ORAL | Status: DC

## 2023-12-15 NOTE — Progress Notes (Signed)
 Rx printedx2. I had Dr. Rosemarie sign rx and faxed to pharmacy at [phone], received fax confirmation.

## 2023-12-22 ENCOUNTER — Other Ambulatory Visit: Payer: Self-pay

## 2023-12-22 MED ORDER — INSULIN GLARGINE 100 UNIT/ML ~~LOC~~ SOLN: 10.0000 [IU] | Freq: Every day | SUBCUTANEOUS | 1 refills | Status: AC

## 2023-12-23 ENCOUNTER — Encounter: Payer: Self-pay | Admitting: Neurology

## 2023-12-23 DIAGNOSIS — G40909 Epilepsy, unspecified, not intractable, without status epilepticus: Secondary | ICD-10-CM

## 2023-12-23 MED ORDER — BRIVIACT 50 MG PO TABS
1.0000 | ORAL_TABLET | Freq: Two times a day (BID) | ORAL | Status: DC
Start: 2023-12-23 — End: 2024-02-10

## 2023-12-24 ENCOUNTER — Other Ambulatory Visit: Payer: Self-pay

## 2023-12-24 MED ORDER — INSULIN GLARGINE 100 UNIT/ML SOLOSTAR PEN: 10.0000 [IU] | PEN_INJECTOR | Freq: Every day | SUBCUTANEOUS | 4 refills | Status: AC

## 2024-01-02 ENCOUNTER — Other Ambulatory Visit: Payer: Self-pay | Admitting: Internal Medicine

## 2024-01-18 ENCOUNTER — Other Ambulatory Visit: Payer: Self-pay | Admitting: Neurology

## 2024-01-25 ENCOUNTER — Other Ambulatory Visit: Payer: Self-pay

## 2024-01-25 ENCOUNTER — Encounter: Payer: Self-pay | Admitting: Neurology

## 2024-01-25 MED ORDER — DIVALPROEX SODIUM ER 500 MG PO TB24: 500.0000 mg | ORAL_TABLET | Freq: Every day | ORAL | 0 refills | Status: DC

## 2024-01-25 NOTE — Addendum Note (Signed)
 Addended by: ONEITA NEVELYN BRAVO on: 01/25/2024 03:59 PM   Modules accepted: Orders

## 2024-02-03 ENCOUNTER — Ambulatory Visit: Admitting: Internal Medicine

## 2024-02-03 ENCOUNTER — Encounter: Payer: Self-pay | Admitting: Internal Medicine

## 2024-02-03 VITALS — BP 122/84 | HR 68 | Temp 97.7°F | Resp 18 | Ht 73.0 in | Wt 215.0 lb

## 2024-02-03 DIAGNOSIS — R296 Repeated falls: Secondary | ICD-10-CM

## 2024-02-03 DIAGNOSIS — E119 Type 2 diabetes mellitus without complications: Secondary | ICD-10-CM

## 2024-02-03 DIAGNOSIS — Z23 Encounter for immunization: Secondary | ICD-10-CM

## 2024-02-03 DIAGNOSIS — I69354 Hemiplegia and hemiparesis following cerebral infarction affecting left non-dominant side: Secondary | ICD-10-CM | POA: Diagnosis not present

## 2024-02-03 DIAGNOSIS — I10 Essential (primary) hypertension: Secondary | ICD-10-CM | POA: Diagnosis not present

## 2024-02-03 NOTE — Assessment & Plan Note (Signed)
We will check a HgBa1c on him today.

## 2024-02-03 NOTE — Assessment & Plan Note (Signed)
 He has had some falls and is requesting PT therapy at this time.  We will arrange it at [facility].

## 2024-02-03 NOTE — Assessment & Plan Note (Signed)
His BP is doing well.  We will continue his current medications. 

## 2024-02-03 NOTE — Assessment & Plan Note (Signed)
 Plan as above.

## 2024-02-03 NOTE — Progress Notes (Signed)
 Office Visit  Subjective   Patient ID: Jacob [name]   DOB: 11/09/1975   Age: 48 y.o.   MRN: [id number]   Chief Complaint Chief Complaint  Patient presents with   Follow-up    Follow up     History of Present Illness Jacob [name]  is a 48 yo male who returns today for followup of his weight.   He was losing weight where on his last visit in 08/2023 he was 167 lbs.  He is now 215 lbs today.  THis wife states that in 12/2022 he weighed 189 lbs. On his last visit, his wife noticed that his weight loss and decreased appetite started since his metformin  change.  We cut his metformin  back to once a day.  I also stopped trazodone  for sleep and started him on mirtazapine .  His wife states that over the interim, she stopped the mirtazapine  due to it making him too hungry and craving things he shouldn't eat.  He stated he did not feel depressed and his wife is not sure about this.  The patient remains on lexapro  20mg  daily and depakote  ER 500mg  daily.   He was started back on trazodone  50mg  at bedtime by his wife and he states he is sleeping well.     The patient is a 48 year old male who returns for a follow-up visit of his T2 diabetes. He states he was diagnosed with diabetes in 2005.  On his last visit, his diabetes was not controlled and we did start him on metformin . We tried to get him started on [facility] but his insurance would not pay for that.  Since the last visit, there have been no problems. He is currently on:  Lantus  12 Units subcut daily, metformin  500mg  daily and humalog  SSI TID which he uses rarely.  He specifically denies unexplained abdominal pain, nausea or vomiting. He is not checking his FSBS often but checks it once a day where he ranges 90-100.  His last HgBA1c was done 3 months ago and was 5.4%. He has no long term complications of diabetic retinopathy, nephropathy, or neuropathy.  He has not had his eyes examined by an optometrist.     The patient is a 47 year old male who presents for a  follow-up evaluation of hypertension.  Since his last visit, there has been no problems.  The patient has been checking her blood pressure at home. The patient's blood pressure has ranged systollically 130-180. The patient's current medications include:  metoprolol  25mg  BID,clonidine  0.3mg  patch weekly and lisinopril  40mg  daily. The patient has been tolerating her medications well. The patient denies any headache, visual changes, dizziness, lightheadness, chest pain, shortness of breath, weakness/numbness, and edema.    I saw him 08/2023 and was trying to get him referred to outpatient PT at [facility].  He was having some falls but had not had any injury.  He is using a motorized wheelchair for long distances and he uses a walker in the home.  He has residual left sided weakness.  He has had a few falls since his last visit.     Past Medical History Past Medical History:  Diagnosis Date   Allergy    Depression    Diabetes mellitus without complication (HCC)    GERD (gastroesophageal reflux disease)    Hypertension    Paralysis (HCC)    Seizures (HCC)    Stroke (HCC)      Allergies Allergies  Allergen Reactions  Hydrochlorothiazide  Other (See Comments)    Bilateral muscle cramping     Medications  Current Outpatient Medications:    ACCU-CHEK GUIDE TEST test strip, USE AS INSTRUCTED, Disp: 100 strip, Rfl: 12   Accu-Chek Softclix Lancets lancets, TEST IN THE AM AND BEFORE MEALS, Disp: 100 each, Rfl: 11   aspirin  EC 81 MG tablet, Take 1 tablet (81 mg total) by mouth daily. Swallow whole., Disp: 30 tablet, Rfl: 12   atorvastatin  (LIPITOR) 40 MG tablet, TAKE 2 TABLETS (80 MG TOTAL) BY MOUTH AT BEDTIME., Disp: 180 tablet, Rfl: 0   baclofen  (LIORESAL ) 10 MG tablet, TAKE 1 TABLET BY MOUTH THREE TIMES A DAY, Disp: 270 tablet, Rfl: 1   blood glucose meter kit and supplies KIT, Dispense based on patient and insurance preference. Use up to four times daily as directed., Disp: 1 each,  Rfl: 0   Brivaracetam  (BRIVIACT ) 50 MG TABS, Take 1 tablet by mouth 2 (two) times daily., Disp: , Rfl:    cloNIDine  (CATAPRES  - DOSED IN MG/24 HR) 0.3 mg/24hr patch, Place 1 patch (0.3 mg total) onto the skin once a week., Disp: 12 patch, Rfl: 3   divalproex  (DEPAKOTE  ER) 500 MG 24 hr tablet, Take 1 tablet (500 mg total) by mouth daily., Disp: 30 tablet, Rfl: 0   escitalopram  (LEXAPRO ) 20 MG tablet, TAKE 1 TABLET BY MOUTH EVERY DAY, Disp: 90 tablet, Rfl: 1   insulin  glargine (LANTUS ) 100 UNIT/ML injection, Inject 0.1 mLs (10 Units total) into the skin daily., Disp: 9 mL, Rfl: 1   insulin  glargine (LANTUS ) 100 UNIT/ML Solostar Pen, Inject 10 Units into the skin daily., Disp: 15 mL, Rfl: 4   insulin  lispro (HUMALOG  KWIKPEN) 100 UNIT/ML KwikPen, INJECT 0-20 UNITS INTO THE SKIN 3 (THREE) TIMES DAILY. INJECT 0-20 UNITS INTO THE SKIN 3 (THREE) TIMES DAILY WITH MEALS. CBG < 70 CALL MD. CBG 70-120 GIVE 0 UNITS 121-150 GIVE 3 UNITS 151-200 GIVE 4 UNITS, 201-250 GIVE 7 UNITS 251-300 GIVE 11 UNITS 301-350 GIVE 15 UNITS 351-400 GIVE 20 UNITS GREATER THAN 400 CALL MD, Disp: 15 mL, Rfl: 1   lisinopril  (ZESTRIL ) 40 MG tablet, TAKE 1 TABLET BY MOUTH EVERY DAY, Disp: 90 tablet, Rfl: 1   metFORMIN  (GLUCOPHAGE ) 500 MG tablet, Take 1 tablet (500 mg total) by mouth daily with breakfast., Disp: 90 tablet, Rfl: 3   metoprolol  tartrate (LOPRESSOR ) 25 MG tablet, TAKE 1 TABLET BY MOUTH TWICE A DAY, Disp: 180 tablet, Rfl: 3   sildenafil  (VIAGRA ) 50 MG tablet, Take 1 tablet (50 mg total) by mouth daily as needed for erectile dysfunction., Disp: 10 tablet, Rfl: 2   traZODone  (DESYREL ) 50 MG tablet, TAKE 1 TABLET BY MOUTH EVERYDAY AT BEDTIME, Disp: 90 tablet, Rfl: 1   Review of Systems Review of Systems  Constitutional:  Negative for chills, fever and malaise/fatigue.  Eyes:  Negative for blurred vision and double vision.  Respiratory:  Negative for cough and shortness of breath.   Cardiovascular:  Negative for chest pain,  palpitations and leg swelling.  Gastrointestinal:  Negative for abdominal pain, constipation, diarrhea, nausea and vomiting.  Genitourinary:  Negative for frequency.  Musculoskeletal:  Negative for myalgias.  Skin:  Negative for itching and rash.  Neurological:  Negative for dizziness, weakness and headaches.  Endo/Heme/Allergies:  Negative for polydipsia.       Objective:    Vitals BP 122/84   Pulse 68   Temp 97.7 F (36.5 C) (Temporal)   Resp 18   Ht 6' 1 (  1.854 m)   Wt 215 lb (97.5 kg)   SpO2 98%   BMI 28.37 kg/m    Physical Examination Physical Exam Constitutional:      Appearance: Normal appearance. He is not ill-appearing.  Cardiovascular:     Rate and Rhythm: Normal rate and regular rhythm.     Pulses: Normal pulses.     Heart sounds: No murmur heard.    No friction rub. No gallop.  Pulmonary:     Effort: Pulmonary effort is normal. No respiratory distress.     Breath sounds: No wheezing, rhonchi or rales.  Abdominal:     General: Abdomen is flat. Bowel sounds are normal. There is no distension.     Palpations: Abdomen is soft.     Tenderness: There is no abdominal tenderness.  Musculoskeletal:     Right lower leg: No edema.     Left lower leg: No edema.  Skin:    General: Skin is warm and dry.     Findings: No rash.  Neurological:     Mental Status: He is alert.     Comments: He has strength 5/5 on right side testing and 0/5 on his left side.        Assessment & Plan:   Essential hypertension His BP is doing well.  We will continue his current medications.  Diabetes mellitus without complication (HCC) We will check a HgBa1c on him today.    Spastic hemiplegia of left nondominant side as late effect of cerebral infarction [facility]) He has had some falls and is requesting PT therapy at this time.  We will arrange it at [facility].  Falls Plan as above.    Return in about 3 months (around 05/04/2024) for annual.   Selinda Fleeta Finger, MD

## 2024-02-03 NOTE — Addendum Note (Signed)
 Addended by: LENETTA LACKS on: 02/03/2024 11:43 AM   Modules accepted: Orders

## 2024-02-04 LAB — HEMOGLOBIN A1C
Est. average glucose Bld gHb Est-mCnc: 214 mg/dL
Hgb A1c MFr Bld: 9.1 % — ABNORMAL HIGH (ref 4.8–5.6)

## 2024-02-07 ENCOUNTER — Other Ambulatory Visit: Payer: Self-pay

## 2024-02-07 MED ORDER — TRAZODONE HCL 50 MG PO TABS: 50.0000 mg | ORAL_TABLET | Freq: Every day | ORAL | 1 refills | Status: AC

## 2024-02-07 MED ORDER — ATORVASTATIN CALCIUM 40 MG PO TABS: 80.0000 mg | ORAL_TABLET | Freq: Every day | ORAL | 0 refills | Status: AC

## 2024-02-07 MED ORDER — ASPIRIN 81 MG PO TBEC: 81.0000 mg | DELAYED_RELEASE_TABLET | Freq: Every day | ORAL | 12 refills | Status: AC

## 2024-02-07 NOTE — Progress Notes (Signed)
 Rx refill

## 2024-02-08 ENCOUNTER — Telehealth: Payer: Self-pay | Admitting: Adult Health

## 2024-02-08 NOTE — Telephone Encounter (Addendum)
 If pt is stable, please offer sooner VV. Slot on hold 9/18 at 215.

## 2024-02-08 NOTE — Telephone Encounter (Signed)
 Patient's mother, [name] said, patient needs appointment for this year to be able to get his refills for Brivaracetam  (BRIVIACT ) 50 MG TABS. There was not an appointment available this year. Schedule for 08/09/2024 art 7:45 am.

## 2024-02-09 NOTE — Telephone Encounter (Signed)
 Phone rep called pt, spoke with wife, connected her to billing dept to address balance, once taken care of appointment (my chart vv)offered by CMA for tomorrow can be offered to pt.

## 2024-02-09 NOTE — Telephone Encounter (Signed)
 Rescheduled patient appointment for patient to be able to get medication refills.

## 2024-02-10 ENCOUNTER — Telehealth (INDEPENDENT_AMBULATORY_CARE_PROVIDER_SITE_OTHER): Admitting: Adult Health

## 2024-02-10 ENCOUNTER — Encounter: Payer: Self-pay | Admitting: Adult Health

## 2024-02-10 DIAGNOSIS — I63511 Cerebral infarction due to unspecified occlusion or stenosis of right middle cerebral artery: Secondary | ICD-10-CM

## 2024-02-10 DIAGNOSIS — G40909 Epilepsy, unspecified, not intractable, without status epilepticus: Secondary | ICD-10-CM | POA: Diagnosis not present

## 2024-02-10 DIAGNOSIS — I69354 Hemiplegia and hemiparesis following cerebral infarction affecting left non-dominant side: Secondary | ICD-10-CM | POA: Diagnosis not present

## 2024-02-10 MED ORDER — BRIVIACT 50 MG PO TABS: 1.0000 | ORAL_TABLET | Freq: Two times a day (BID) | ORAL | 5 refills | Status: DC

## 2024-02-10 MED ORDER — DIVALPROEX SODIUM ER 500 MG PO TB24: 500.0000 mg | ORAL_TABLET | Freq: Every day | ORAL | 11 refills | Status: AC

## 2024-02-10 NOTE — Progress Notes (Signed)
 [facility] [address] [place]. [place] [address] [phone]       OFFICE FOLLOW-UP NOTE  Jacob [name] Date of Birth:  Apr 16, 1976 Medical Record Number:  [id number]   Primary neurologist: Dr. Rosemarie Reason for visit stroke and seizure follow-up   Virtual Visit via Video Note  I connected with Jacob [name] on 02/10/24 at  2:15 PM EDT by a video enabled telemedicine application and verified that I am speaking with the correct person using two identifiers.  Location: Patient: at home, accompanied by his wife Provider: in office, [facility]   I discussed the limitations of evaluation and management by telemedicine and the availability of in person appointments. The patient expressed understanding and agreed to proceed.     HPI:   Update 02/10/2024 JM: Patient returns for overdue stroke and seizure follow-up via MyChart video visit.  Wife provides majority of history.  Overall has been stable since prior visit without any new stroke/TIA symptoms and denies any seizure activity.  Remains on Briviact  50 mg twice daily without side effects.  Continue left spastic hemiparesis stable since prior visit.  He ambulates short distance with rolling walker and uses motorized wheelchair for long distance.  Denies any recent falls.  PCP recently placed new referral for PT at [facility].  Continues on Depakote  ER 500 mg nightly and mood has been stable.  Remains on aspirin  and atorvastatin  without side effects.  Routinely follows with PCP for stroke risk factor management.     History provided for reference purposes only Update 06/06/2022 JM: Patient returns for follow-up visit accompanied by his wife who provides majority of history.  At prior visit, started on Depakote  ER 500 mg night for concern of aggressive behavior. Continued on Briviact  for seizure prevention.   Overall he has been doing well.  Notes improvement of behavior since starting Depakote , has been  tolerating without side effects.  Also remains on Briviact  50 mg twice daily.  No seizure activity. Continued left spastic hemiplegia, s/p Xeomin  injection by Dr. Onita on on 10/30, notes some improvement of arm tightness, repeat injection scheduled 2/5.  Primarily transfers via wheelchair, is able to stand/pivot for transfers.  Able to lift his left leg but difficulty bearing weight.  He does mention having issues with erectile dysfunction, has been prescribed sildenafil  by PCP but caused headache per wife. No new stroke/TIA symptoms.  Compliant on aspirin  and atorvastatin .  Routinely follows with PCP for stroke risk factor management.  Update 03/23/2023 Dr. Rosemarie : Patient is worked into my schedule today upon request from wife.  He has been having some increasingly belligerent, aggressive and impulsive behavior.  1 day he insisted on getting into his car and driving.  The day tried to get a gun into his house.  He can be difficult to control when he gets into such moods.  Fortunately they do not last long.  He has not been violent or hit anybody yet.  The wife states that these behaviors were noted immediately after his stroke in April of last year but seem to have gotten worse recently.  He was started on Keppra  initially for seizures in April of this year but had some side effects and was switched to Briviact  but is only on a low-dose 50 mg twice daily and has done well and has been seizure-free.  Continues to have significant spastic left hemiplegia and is able to walk with a hemiwalker but with a safety belt and one-person assist.  He has seen Dr. Onita for Xeomin  injections and is awaiting insurance approval to start them soon.  He has not had any recurrent stroke or TIA symptoms.  He is tolerating aspirin  well without side effects.  His blood pressure and cholesterol have been under good control.  Lab work on 01/21/2023 showed LDL cholesterol 37 mg percent and hemoglobin A1c 6.5%.  Update  11/18/2022 Dr.  Rosemarie: He returns for follow-up after last visit with me 6 months ago.  He was seen in the ER on 09/16/2022 with focal seizure on Keppra  100 twice daily.  Patient had trouble tolerating it is.  He was seen in the office by Dr. Gregg on 10/01/2022 and switch to Briviact  50 mg twice daily which she seems to be tolerating well without side effects.  EEG done showed focal right hemispheric slowing but no seizures.  He continues to have spastic left hemiplegia and is currently working with physical and Occupational Therapy at home.  He is barely able to stand and walks without hemiwalker with a 1 person assist.  He can help with transfers.   He has some strength in his left shoulder and elbow but not in his hand or fingers.  He is able to lift his left leg against gravity but unable to bear weight.SABRA  He is still on Eliquis  for lower extremity DVT but lower extremity Doppler study in January 2024 had shown resolution of DVT with no clot noted His blood pressure and sugars have been under good control.  His primary physician had increased his dose of levothyroxine last lipid profile on 09/05/2022 was satisfactory with LDL cholesterol 70 and hemoglobin A1c 7.0.  Update 05/06/2022 Dr. Rosemarie: Jacob [name] is a 48 year old Caucasian male seen today for initial office follow-up visit following hospital admission for stroke and April 2023.  He is accompanied by his wife.  History is obtained from them and review of electronic medical records.  I have personally reviewed pertinent available imaging films in PACS.Jacob [name] is a 48 y.o. male with a history of htn and DM who presents with left sided facial droop. Initially he had arm symptoms as well. He was normal at 1:30 am, then started having symptoms and woke his wife up. He described the symptoms as being that he was holding his phone, then suddenly could tnot tell he was holding his phone any more. He woke up  his wife who called 911. EMS noted some arm weakness and severe  left facial droop.  He was brought in as a code stroke, and had significant improvement en route.   On initial examination, his arm symptoms had almost completely resolved(mild slowness on sequential opposition of digits, but no drift or ataxia). He still had mild dysarthria and facial weakness, but nothing that appeared disabling. TNK was discussed, but given the mildness of symptoms was not pursued. Over the the next few minutes, it was noted that he had some fluctuation of his symptoms, from mild to severe to mild dysarthria. CTA showed R M1 occlusion. In the setting of fluctuating symptoms which were disabling at times, TNK was re-discussed and decision was made to pursue this. There was delay after this due to elevated blood pressures which needed to be treated.  Right MCA was revascularized with TICI 3 flow established.  Unfortunately the patient yet developed a large right hemispheric infarct and significant cytotoxic cerebral edema postprocedure was taken for emergent decompressive hemicraniectomy by Dr. Mavis with placement of the bone flap in  the abdomen.  Left intubated for several days required tracheostomy to manage respiratory failure.  He developed DVT in the right peroneal vein for started on Eliquis  for this..  2D echo showed ejection fraction greater than 75%.  TEE showed small intrapulmonary shunt.  Hemoglobin A1c was 8.1.  LDL cholesterol is 96 mg percent.  Was transferred to LTAC and is currently living at home.  Getting home physical and occupational therapy 1 day a week.  He still has significant left hemiplegia function on the left.  He is able to stand with help of the therapist but not able to walk yet.  He can hold onto the walker taking maybe 1 or 2 steps.  He is tolerating Eliquis  well without bruising or bleeding.  He has not yet had any follow-up venous Doppler studies.  To see if his DVT has resolved.  Since had his bone flap replaced on 02/09/2022 by Dr. Mavis follow-up CT scan  of the head on 02/12/2022 showed stable appearance of his right MCA and PCA infarcts without acute abnormality.   Initial visit 12/08/2021 JM: Jacob [name] is a 48 y.o. male with a history of htn and DM who presents with left sided facial droop and left arm weakness.  Initially, significant improvement of symptoms upon route to ED with initial evaluation symptoms almost completely resolved therefore TNK not administered. CTH no acute abnormality, MRI showed tiny acute infarct right MCA.  But then noted fluctuation mild to severe dysarthria, right gaze preference, left hemiplegia and left sided sensory deficit. CTA showed R M1 occlusion. Due to fluctuation of symptoms which were disabling at times, TNK administered after stabilization of blood pressure.  Taken for mechanical thrombectomy with TICI 3 revascularization. MRI showed right MCA territory infarct.  Repeat MRA 4/22 showed reoccluded right M2 branch and left M1 stenosis.  TEE consistent with intrapulmonary shunt.  Repeat CT head 4/24 as difficult to arouse which showed increase edema with new 12 mm midline shift and underwent decompressive hemicraniectomy with placement of bone flap and abdomen.  Repeat CT head stable.  Left intubated postprocedure and eventual placement of tracheostomy 5/1.  Developed DVT in right peroneal vein and placed on Eliquis .  Placed on atorvastatin  40 mg daily for LDL 96.  A1c 8.1 with diabetic regimen adjusted advised outpatient follow-up with PCP for further management.  Hospital course prolonged by dysphagia requiring TF and NPO status, respiratory failure, fever with leukocytosis with tracheal aspirate +MRSA and headaches.  He was eventually discharged to [facility] on 5/17.     Patient is being seen for initial hospital follow-up accompanied by his wife who provides majority of history.  Currently residing at [facility] care center.  Has been doing well without new stroke/TIA symptoms.  Continues working with  PT/OT/SLP. Reports improvement of dysphagia, currently on modified diet without difficulty. Had trach tube removed beginning of June.  Continued right gaze preference but wife believes this has been improving. Continued dense left hemiplegia with sensory impairment. Denies headaches - on topamax  50mg  twice daily.    Did had f/u with neurosurgery approx 3 weeks ago  - currently awaiting for swelling to go down in order to have bone flap replaced.    Per SNF MAR review, compliant on Eliquis  and atorvastatin , denies side effects Blood pressure today 97/67 -monitored at facility and per wife, typically 120-130s.  Glucose levels monitored at facility which have been stable per wife on current insulin  regimen   No further concerns at this  time.      ROS:   14 system review of systems is positive for those listed in HPI and all other systems negative  PMH:  Past Medical History:  Diagnosis Date   Allergy    Depression    Diabetes mellitus without complication (HCC)    GERD (gastroesophageal reflux disease)    Hypertension    Paralysis (HCC)    Seizures (HCC)    Stroke [facility])     Social History:  Social History   Socioeconomic History   Marital status: Married    Spouse name: Not on file   Number of children: Not on file   Years of education: Not on file   Highest education level: Not on file  Occupational History   Not on file  Tobacco Use   Smoking status: Never   Smokeless tobacco: Never  Vaping Use   Vaping status: Never Used  Substance and Sexual Activity   Alcohol use: Yes    Alcohol/week: 1.0 standard drink of alcohol    Types: 1 Cans of beer per week    Comment: OCCASIONALLY   Drug use: Yes    Types: MDMA (Ecstacy)   Sexual activity: Not on file  Other Topics Concern   Not on file  Social History Narrative   Lives at home with wife and son 11yr old   Right handed   Caffeine-8oz   Social Drivers of Health   Financial Resource Strain: Low Risk  (02/05/2022)    Overall Financial Resource Strain (CARDIA)    Difficulty of Paying Living Expenses: Not very hard  Food Insecurity: No Food Insecurity (03/18/2022)   Hunger Vital Sign    Worried About Running Out of Food in the Last Year: Never true    Ran Out of Food in the Last Year: Never true  Transportation Needs: No Transportation Needs (03/18/2022)   PRAPARE - Administrator, Civil Service (Medical): No    Lack of Transportation (Non-Medical): No  Physical Activity: Not on file  Stress: Not on file  Social Connections: Not on file  Intimate Partner Violence: Not on file    Medications:   Current Outpatient Medications on File Prior to Visit  Medication Sig Dispense Refill   ACCU-CHEK GUIDE TEST test strip USE AS INSTRUCTED 100 strip 12   Accu-Chek Softclix Lancets lancets TEST IN THE AM AND BEFORE MEALS 100 each 11   aspirin  EC 81 MG tablet Take 1 tablet (81 mg total) by mouth daily. Swallow whole. 30 tablet 12   atorvastatin  (LIPITOR) 40 MG tablet Take 2 tablets (80 mg total) by mouth at bedtime. 180 tablet 0   baclofen  (LIORESAL ) 10 MG tablet TAKE 1 TABLET BY MOUTH THREE TIMES A DAY 270 tablet 1   blood glucose meter kit and supplies KIT Dispense based on patient and insurance preference. Use up to four times daily as directed. 1 each 0   Brivaracetam  (BRIVIACT ) 50 MG TABS Take 1 tablet by mouth 2 (two) times daily.     cloNIDine  (CATAPRES  - DOSED IN MG/24 HR) 0.3 mg/24hr patch Place 1 patch (0.3 mg total) onto the skin once a week. 12 patch 3   divalproex  (DEPAKOTE  ER) 500 MG 24 hr tablet Take 1 tablet (500 mg total) by mouth daily. 30 tablet 0   escitalopram  (LEXAPRO ) 20 MG tablet TAKE 1 TABLET BY MOUTH EVERY DAY 90 tablet 1   insulin  glargine (LANTUS ) 100 UNIT/ML injection Inject 0.1 mLs (10 Units total) into the  skin daily. 9 mL 1   insulin  glargine (LANTUS ) 100 UNIT/ML Solostar Pen Inject 10 Units into the skin daily. 15 mL 4   insulin  lispro (HUMALOG  KWIKPEN) 100 UNIT/ML  KwikPen INJECT 0-20 UNITS INTO THE SKIN 3 (THREE) TIMES DAILY. INJECT 0-20 UNITS INTO THE SKIN 3 (THREE) TIMES DAILY WITH MEALS. CBG < 70 CALL MD. CBG 70-120 GIVE 0 UNITS 121-150 GIVE 3 UNITS 151-200 GIVE 4 UNITS, 201-250 GIVE 7 UNITS 251-300 GIVE 11 UNITS 301-350 GIVE 15 UNITS 351-400 GIVE 20 UNITS GREATER THAN 400 CALL MD 15 mL 1   lisinopril  (ZESTRIL ) 40 MG tablet TAKE 1 TABLET BY MOUTH EVERY DAY 90 tablet 1   metFORMIN  (GLUCOPHAGE ) 500 MG tablet Take 1 tablet (500 mg total) by mouth daily with breakfast. 90 tablet 3   metoprolol  tartrate (LOPRESSOR ) 25 MG tablet TAKE 1 TABLET BY MOUTH TWICE A DAY 180 tablet 3   sildenafil  (VIAGRA ) 50 MG tablet Take 1 tablet (50 mg total) by mouth daily as needed for erectile dysfunction. 10 tablet 2   traZODone  (DESYREL ) 50 MG tablet Take 1 tablet (50 mg total) by mouth at bedtime. 90 tablet 1   No current facility-administered medications on file prior to visit.    Allergies:   Allergies  Allergen Reactions   Hydrochlorothiazide  Other (See Comments)    Bilateral muscle cramping    Physical Exam  General: Frail middle-aged African-American male, seated, in no evident distress  Neurologic Exam Mental Status: Awake and fully alert.  Mild dysarthria. Recent memory impaired and remote memory intact. Attention span, concentration and fund of knowledge mildly impaired with wife provided majority of history. Mood and affect appropriate.         ASSESSMENT: 48 year old African-American male with large right MCA infarct in 08/2021 secondary to right MCA occlusion s/p IV thrombolysis with TNK followed by mechanical thrombectomy with successful revascularization with patient developing malignant cytotoxic edema requiring emergent hemicraniectomy. Stroke etiology cryptogenic versus intracranial stenosis with residual left spastic hemiplegia.  He had symptomatic seizures on 09/16/2022 and was initially started on Keppra  but switched to Briviact  due to side effects  but is now having increase impulsivity and aggressive behavior therefore started on Depakote  in 02/2023 with improvement of behaviors.    PLAN:  -Continue Depakote  ER 500 mg nightly - refill provided - Continue Briviact  50 mg twice daily - refill provided  -Continue aspirin  81 mg daily and atorvastatin  80 mg daily for secondary stroke prevention measures managed/prescribed by PCP -Continue close PCP follow-up for aggressive stroke risk factor management including BP goal<130/90, HLD with LDL goal<70 and DM with A1c.<7      Follow up in 1 year or call earlier if needed   I personally spent a total of 25 minutes in the care of the patient today including preparing to see the patient, counseling and educating, placing orders, and documenting clinical information in the EHR.   Harlene Bogaert, AGNP-BC  [facility] [address] [place], [place] [address]  Phone [phone] Fax [phone] Note: This document was prepared with digital dictation and possible smart phrase technology. Any transcriptional errors that result from this process are unintentional.

## 2024-02-15 ENCOUNTER — Ambulatory Visit: Payer: Self-pay

## 2024-02-15 NOTE — Progress Notes (Signed)
 Patient called.  Patient aware.  I have called and informed the patients wife that Dr. Fleeta Finger stated  His dibetes is not controlled.  Increase his metformin  and change it to Metformin  ER 1000mg  BID. SABRA  Pts wife is aware, and stated she will talk to the patient before I send the new Metformin  Rx.

## 2024-02-21 ENCOUNTER — Other Ambulatory Visit: Payer: Self-pay | Admitting: Neurology

## 2024-02-21 DIAGNOSIS — G40909 Epilepsy, unspecified, not intractable, without status epilepticus: Secondary | ICD-10-CM

## 2024-02-21 NOTE — Telephone Encounter (Signed)
 Requested Prescriptions   Pending Prescriptions Disp Refills   BRIVIACT  50 MG TABS [Pharmacy Med Name: BRIVIACT  50 MG TABLET] 60 tablet     Sig: TAKE 50 MG BY MOUTH IN THE MORNING AND AT BEDTIME.   Last seen 02/10/24 Next appt not scheduled  Dispenses   Dispensed Days Supply Quantity Provider Pharmacy  BRIVIACT  50 MG TABLET 01/18/2024 30 60 each Camara, Amadou, MD [facility] [phone] - [facility]...  BRIVIACT  50 MG TABLET 10/31/2023 29 60 each Camara, Amadou, MD [facility] [phone] - [facility]...  BRIVIACT  50 MG TABLET 10/03/2023 30 60 each Camara, Amadou, MD [facility] [phone] - [facility]...  BRIVIACT  50 MG TABLET 09/06/2023 30 60 each Camara, Amadou, MD [facility] [phone] - [facility]...  BRIVIACT  50 MG TABLET 08/10/2023 30 60 each Camara, Amadou, MD [facility] [phone] - [facility]...  BRIVIACT  50 MG TABLET 07/03/2023 30 60 each Ines Onetha NOVAK, MD [facility] [phone] - [facility]...  BRIVIACT  50 MG TABLET 05/31/2023 30 60 each Ines Onetha NOVAK, MD [facility] [phone] - W...  BRIVIACT  50 MG TABLET 04/22/2023 30 60 each Ines Onetha NOVAK, MD [facility] [phone] - W...  BRIVIACT  50 MG TABLET 03/24/2023 30 60 each Ines Onetha NOVAK, MD [facility] [phone] - [facility].SABRASABRA

## 2024-02-24 ENCOUNTER — Other Ambulatory Visit: Payer: Self-pay

## 2024-02-24 ENCOUNTER — Other Ambulatory Visit: Payer: Self-pay | Admitting: Internal Medicine

## 2024-02-24 MED ORDER — METFORMIN HCL ER (MOD) 1000 MG PO TB24: 1000.0000 mg | ORAL_TABLET | Freq: Two times a day (BID) | ORAL | 3 refills | Status: DC

## 2024-02-24 NOTE — Progress Notes (Signed)
 New Rx

## 2024-02-28 ENCOUNTER — Other Ambulatory Visit: Payer: Self-pay | Admitting: Internal Medicine

## 2024-02-29 ENCOUNTER — Ambulatory Visit: Attending: Internal Medicine

## 2024-02-29 DIAGNOSIS — R269 Unspecified abnormalities of gait and mobility: Secondary | ICD-10-CM | POA: Insufficient documentation

## 2024-02-29 DIAGNOSIS — M6281 Muscle weakness (generalized): Secondary | ICD-10-CM | POA: Insufficient documentation

## 2024-02-29 DIAGNOSIS — R278 Other lack of coordination: Secondary | ICD-10-CM | POA: Diagnosis present

## 2024-02-29 DIAGNOSIS — R262 Difficulty in walking, not elsewhere classified: Secondary | ICD-10-CM | POA: Diagnosis present

## 2024-02-29 DIAGNOSIS — I69354 Hemiplegia and hemiparesis following cerebral infarction affecting left non-dominant side: Secondary | ICD-10-CM | POA: Insufficient documentation

## 2024-02-29 DIAGNOSIS — R2681 Unsteadiness on feet: Secondary | ICD-10-CM | POA: Insufficient documentation

## 2024-02-29 NOTE — Therapy (Signed)
 OUTPATIENT PHYSICAL THERAPY EVALUATION  Patient Name: Jacob [name] MRN: [id number] DOB:04/06/1976, 48 y.o., male 1 Date: 02/29/2024  PCP: [name], MD REFERRING PROVIDER: [name], Mayo, MD  END OF SESSION:  PT End of Session - 02/29/24 1019     Visit Number 1    Number of Visits 16    Date for Recertification  04/25/24    Authorization Type BCBS    Progress Note Due on Visit 10          Past Medical History:  Diagnosis Date   Allergy    Depression    Diabetes mellitus without complication (HCC)    GERD (gastroesophageal reflux disease)    Hypertension    Paralysis (HCC)    Seizures (HCC)    Stroke [facility])    Past Surgical History:  Procedure Laterality Date   APPENDECTOMY     BUBBLE STUDY  10/02/2021   Procedure: BUBBLE STUDY;  Surgeon: Okey Vina GAILS, MD;  Location: [facility] ENDOSCOPY;  Service: Cardiovascular;;   CRANIOPLASTY Right 02/09/2022   Procedure: Craniectomy with Replacement of Bone Flap From the Abdomen;  Surgeon: [name], MD;  Location: [facility] OR;  Service: Neurosurgery;  Laterality: Right;   CRANIOTOMY Right 09/15/2021   Procedure: RIGHT DECOMPRESSIVE CRANIOTOMY, PLACEMENT OF BONE FLAP IN ABDOMEN;  Surgeon: [name], MD;  Location: [facility] OR;  Service: Neurosurgery;  Laterality: Right;   IR CT HEAD LTD  09/12/2021   IR PERCUTANEOUS ART THROMBECTOMY/INFUSION INTRACRANIAL INC DIAG ANGIO  09/12/2021   IR US  GUIDE VASC ACCESS RIGHT  09/12/2021   LAPAROSCOPIC APPENDECTOMY  10/24/2013   Procedure: APPENDECTOMY LAPAROSCOPIC;  Surgeon: [name], MD;  Location: [facility] ORS;  Service: General;;   RADIOLOGY WITH ANESTHESIA N/A 09/12/2021   Procedure: IR WITH ANESTHESIA;  Surgeon: Dolphus Carrion, MD;  Location: [facility] OR;  Service: Radiology;  Laterality: N/A;   TEE WITHOUT CARDIOVERSION N/A 10/02/2021   Procedure: TRANSESOPHAGEAL ECHOCARDIOGRAM (TEE);  Surgeon: Okey Vina GAILS, MD;  Location: [facility] ENDOSCOPY;  Service: Cardiovascular;  Laterality: N/A;    Patient Active Problem List   Diagnosis Date Noted   Falls 09/06/2023   Hypercholesterolemia 09/06/2023   Weight loss 07/26/2023   Diabetes mellitus (HCC) 01/21/2023   Hypercholesteremia 01/21/2023   Gait abnormality 10/29/2022   Annual physical exam 09/22/2022   Cerebrovascular disease 09/22/2022   History of DVT (deep vein thrombosis) 09/22/2022   Spastic hemiplegia of left nondominant side as late effect of cerebral infarction (HCC) 09/22/2022   Seizure disorder (HCC) 09/22/2022   BMI 27.0-27.9,adult 09/22/2022   Mild episode of recurrent major depressive disorder 06/15/2022   Status post craniectomy 02/09/2022   Urinary retention 09/18/2021   Vitamin D  deficiency 11/04/2020   Hyperlipidemia 03/28/2018   Diabetes mellitus without complication (HCC) 02/23/2018   Essential hypertension 02/23/2018   ONSET DATE: 2023 REFERRING DIAG: Spastic Hemiplegia  THERAPY DIAG:  Muscle weakness (generalized)  Other lack of coordination  Difficulty in walking, not elsewhere classified  Abnormality of gait and mobility  Rationale for Evaluation and Treatment: rehab  SUBJECTIVE:  SUBJECTIVE STATEMENT: Pt accompanied by: Spouse  PERTINENT HISTORY:   48yoM with history of spastic Left hemiplegia s/p craniectomy. Pt previously in rehab at [facility], then at [facility] main for much of 2024. Pt reports functional plateau at home, has remained modI to supervision for much of his ADL. Pt uses a power WC for most mobility at present, still trying to practice AMB in home with HW and with PCA ~3x months, <169ft typically. Pt sleeps in adjustable bed, reports at least a couple falls related to bed to/from [facility] transfers. Pt reports he has not been able to work on his strength and mobility as he would like since DC, feels like  he is able to work harder and accomplish more in a PT setting, is referred back to continue to progress with all mobility and improve his general safety.  PAIN:  Are you having pain? No  PRECAUTIONS: Fall, restricted LUE   WEIGHT BEARING RESTRICTIONS: Yes NWB LUE  FALLS: Has patient fallen in last 6 months? Yes. Number of falls many  LIVING ENVIRONMENT: Lives with: wife, with PCA coverage Lives in: House/apartment Stairs: No Has following equipment at home: Quad cane large base, Hemi walker, Wheelchair (power), and Ramped entry  PATIENT GOALS: Get stronger, safer, more independent with ADL mobility  OBJECTIVE:  Note: Objective measures were completed at evaluation unless otherwise noted.  SENSATION: *Pt has known anesthesia of LLE of note given intermittent use of carbon fiber AFO  LOWER EXTREMITY MMT:    MMT Right Eval Left Eval  Knee flexion    Knee extension    Ankle dorsiflexion  0/5  Ankle plantarflexion  0/5  Ankle inversion  0/5  Ankle eversion  0/5  (Blank rows = not tested)  TRANSFERS: Pull to stand, step pivot car transfers STS push off for step pivot transfers to/from power WC  GAIT:  RUE HW 3-point gait; has not been using his AFO at home, feel like this is only for when doing PT.   FUNCTIONAL TESTS:  -pull to stand, step pivot transfer from [facility] to plinth, car transfer simulation, minGuardA to supervision -STS transfer c RUE HW x1, then static stance x2 minutes half hands free, half with RUE support -STS to HW x5, minGuardA 18.1sec  -seated lateral scoot 1x25ft bilat c AFO donned  -seated RUE reach to floor, then upright sitting x5 -STS from plinth to [facility], then AMB 4x36ft c HW and 3 turns -STS from plinth to [facility], then AMB 53ft,minGuardA                                                                                                                               TREATMENT DATE 02/29/24 :   -pull to stand, step pivot transfer from [facility] to plinth, car transfer  simulation, minGuardA to supervision -STS transfer c RUE HW x1, then static stance x2 minutes half hands free, half with RUE support -STS to HW x5, minGuardA 18.1sec  -  seated lateral scoot 1x52ft bilat c AFO donned  -seated RUE reach to floor, then upright sitting x5 -STS from plinth to [facility], then AMB 4x57ft c HW and 3 turns -STS from plinth to [facility], then AMB 36ft,minGuardA    PATIENT EDUCATION: Education details: Should be using AFO at home when mobilizing to improve safety, should also use insole over carbon fiber plate Person educated: Patient and Spouse Education method: collaborative learning, deliberate practice, positive reinforcement, explicit instruction, establish rules. Education comprehension: fair, difficult to convince  HOME EXERCISE PROGRAM: None  GOALS: Goals reviewed with patient? No  SHORT TERM GOALS: Target date: 03/31/24  Pt to demonstrate 5xSTS ad lib fromstandard height in <14sec without LOB  Baseline: 18sec Goal status: INITIAL  2.  Pt to demonstrate ability to perform sustained standing dual tasking with intermittent support without LOB x5 minutes  Baseline: 2 min at eval  Goal status: INITIAL  3.  Pt to demonstrate >151ft  Baseline:  Goal status: INITIAL  LONG TERM GOALS: Target date: 04/30/24  30sec chair rise, technique ad lib, >11x without LOB Baseline:  Goal status: INITIAL  2.  > 362ft without LOB with HW  Baseline:  Goal status: INITIAL  3.  Pt to reports consistent use of AFO in house when performing gait training.  Baseline:  Goal status: INITIAL  4.  Pt to report no falls in most recent 4 weeks.  Baseline: falls often, sometimes with bed transfers and without footwear. Goal status: INITIAL ASSESSMENT:  CLINICAL IMPRESSION: 48yoM with Left hypotonic hemiplegia referred back to continue to progress mobility, balance, safety, strength. Assessment revealing of mild improvement in basic mobility since DC last year, however pt has a  lot of falls still, is still dependent on caregiver supervision for safety, and show potential to advance his ability to mobilize safely. Patient will benefit from skilled physical therapy intervention to reduce deficits and impairments identified in evaluation, in order to reduce pain, improve quality of life, and maximize activity tolerance for ADL, IADL, and leisure/fitness. Physical therapy will help pt achieve long and short term goals of care.  OBJECTIVE IMPAIRMENTS: Decreased knowledge of condition, decreased use of DME, decreased mobility, difficulty walking, decreased strength, decreased ROM. ACTIVITY LIMITATIONS: Lifting, standing, walking, squatting, transfers, locomotion level PARTICIPATION LIMITATIONS: Cleaning, laundry, interpersonal relationships, driving, yardwork, community activity.  PERSONAL FACTORS: Age, behavior pattern, education, past/current experiences, transportation, profession  are also affecting patient's functional outcome.  REHAB POTENTIAL: Good CLINICAL DECISION MAKING: Medium  EVALUATION COMPLEXITY: Moderate    PLAN:  PT FREQUENCY: 1-2x/week  PT DURATION: 8 weeks  PLANNED INTERVENTIONS: 97110-Therapeutic exercises, 97530- Therapeutic activity, 97112- Neuromuscular re-education, 97535- Self Care, 02859- Manual therapy, [phone]- Gait training, [phone]- Orthotic Initial, Patient/Family education, Balance training, and Stair training  PLAN FOR NEXT SESSION: transfers and gait training   Kelleigh Skerritt C, PT 02/29/2024, 12:43 PM  7:40 AM, 03/01/24 Peggye JAYSON Linear, PT, DPT Physical Therapist - [facility]  Outpatient Physical Therapy- Main Campus [phone]    12:43 PM, 02/29/24 Peggye JAYSON Linear, PT, DPT Physical Therapist - [facility]  [phone] [facility])

## 2024-03-02 ENCOUNTER — Other Ambulatory Visit: Payer: Self-pay | Admitting: Internal Medicine

## 2024-03-02 ENCOUNTER — Ambulatory Visit: Admitting: Physical Therapy

## 2024-03-06 ENCOUNTER — Ambulatory Visit

## 2024-03-06 DIAGNOSIS — R2681 Unsteadiness on feet: Secondary | ICD-10-CM

## 2024-03-06 DIAGNOSIS — R278 Other lack of coordination: Secondary | ICD-10-CM

## 2024-03-06 DIAGNOSIS — M6281 Muscle weakness (generalized): Secondary | ICD-10-CM | POA: Diagnosis not present

## 2024-03-06 DIAGNOSIS — R269 Unspecified abnormalities of gait and mobility: Secondary | ICD-10-CM

## 2024-03-06 DIAGNOSIS — R262 Difficulty in walking, not elsewhere classified: Secondary | ICD-10-CM

## 2024-03-06 NOTE — Therapy (Addendum)
 OUTPATIENT PHYSICAL THERAPY TREATMENT  Patient Name: Jacob [name] MRN: [id number] DOB:1975-10-27, 48 y.o., male 23 Date: 03/06/2024  PCP: [name], MD REFERRING PROVIDER: [name], Mayo, MD  END OF SESSION:  PT End of Session - 03/06/24 1148     Visit Number 2    Number of Visits 16    Date for Recertification  04/25/24    Authorization Type BCBS    Progress Note Due on Visit 10    PT Start Time 1150    PT Stop Time 1235    PT Time Calculation (min) 45 min    Equipment Utilized During Treatment Gait belt    Activity Tolerance Patient tolerated treatment well    Behavior During Therapy WFL for tasks assessed/performed          Past Medical History:  Diagnosis Date   Allergy    Depression    Diabetes mellitus without complication (HCC)    GERD (gastroesophageal reflux disease)    Hypertension    Paralysis (HCC)    Seizures (HCC)    Stroke [facility])    Past Surgical History:  Procedure Laterality Date   APPENDECTOMY     BUBBLE STUDY  10/02/2021   Procedure: BUBBLE STUDY;  Surgeon: Okey Vina GAILS, MD;  Location: [facility] ENDOSCOPY;  Service: Cardiovascular;;   CRANIOPLASTY Right 02/09/2022   Procedure: Craniectomy with Replacement of Bone Flap From the Abdomen;  Surgeon: [name], MD;  Location: [facility] OR;  Service: Neurosurgery;  Laterality: Right;   CRANIOTOMY Right 09/15/2021   Procedure: RIGHT DECOMPRESSIVE CRANIOTOMY, PLACEMENT OF BONE FLAP IN ABDOMEN;  Surgeon: [name], MD;  Location: [facility] OR;  Service: Neurosurgery;  Laterality: Right;   IR CT HEAD LTD  09/12/2021   IR PERCUTANEOUS ART THROMBECTOMY/INFUSION INTRACRANIAL INC DIAG ANGIO  09/12/2021   IR US  GUIDE VASC ACCESS RIGHT  09/12/2021   LAPAROSCOPIC APPENDECTOMY  10/24/2013   Procedure: APPENDECTOMY LAPAROSCOPIC;  Surgeon: [name], MD;  Location: [facility] ORS;  Service: General;;   RADIOLOGY WITH ANESTHESIA N/A 09/12/2021   Procedure: IR WITH ANESTHESIA;  Surgeon: Dolphus Carrion, MD;  Location:  [facility] OR;  Service: Radiology;  Laterality: N/A;   TEE WITHOUT CARDIOVERSION N/A 10/02/2021   Procedure: TRANSESOPHAGEAL ECHOCARDIOGRAM (TEE);  Surgeon: Okey Vina GAILS, MD;  Location: [facility] ENDOSCOPY;  Service: Cardiovascular;  Laterality: N/A;   Patient Active Problem List   Diagnosis Date Noted   Falls 09/06/2023   Hypercholesterolemia 09/06/2023   Weight loss 07/26/2023   Diabetes mellitus (HCC) 01/21/2023   Hypercholesteremia 01/21/2023   Gait abnormality 10/29/2022   Annual physical exam 09/22/2022   Cerebrovascular disease 09/22/2022   History of DVT (deep vein thrombosis) 09/22/2022   Spastic hemiplegia of left nondominant side as late effect of cerebral infarction (HCC) 09/22/2022   Seizure disorder (HCC) 09/22/2022   BMI 27.0-27.9,adult 09/22/2022   Mild episode of recurrent major depressive disorder 06/15/2022   Status post craniectomy 02/09/2022   Urinary retention 09/18/2021   Vitamin D  deficiency 11/04/2020   Hyperlipidemia 03/28/2018   Diabetes mellitus without complication (HCC) 02/23/2018   Essential hypertension 02/23/2018   ONSET DATE: 2023 REFERRING DIAG: Spastic Hemiplegia  THERAPY DIAG:  Muscle weakness (generalized)  Other lack of coordination  Difficulty in walking, not elsewhere classified  Abnormality of gait and mobility  Unsteadiness on feet  Rationale for Evaluation and Treatment: rehab  SUBJECTIVE:  SUBJECTIVE STATEMENT: Patient reports doing okay today without any new issues since Evaluation last week. States no falls and happy to be back in PT.   Pt accompanied by: Spouse  PERTINENT HISTORY:   48yoM with history of spastic Left hemiplegia s/p craniectomy. Pt previously in rehab at [facility], then at [facility] main for much of 2024. Pt reports functional plateau at home,  has remained modI to supervision for much of his ADL. Pt uses a power WC for most mobility at present, still trying to practice AMB in home with HW and with PCA ~3x months, <151ft typically. Pt sleeps in adjustable bed, reports at least a couple falls related to bed to/from [facility] transfers. Pt reports he has not been able to work on his strength and mobility as he would like since DC, feels like he is able to work harder and accomplish more in a PT setting, is referred back to continue to progress with all mobility and improve his general safety.  PAIN:  Are you having pain? No  PRECAUTIONS: Fall, restricted LUE   WEIGHT BEARING RESTRICTIONS: Yes NWB LUE  FALLS: Has patient fallen in last 6 months? Yes. Number of falls many  LIVING ENVIRONMENT: Lives with: wife, with PCA coverage Lives in: House/apartment Stairs: No Has following equipment at home: Quad cane large base, Hemi walker, Wheelchair (power), and Ramped entry  PATIENT GOALS: Get stronger, safer, more independent with ADL mobility  OBJECTIVE:  Note: Objective measures were completed at evaluation unless otherwise noted.  SENSATION: *Pt has known anesthesia of LLE of note given intermittent use of carbon fiber AFO  LOWER EXTREMITY MMT:    MMT Right Eval Left Eval  Knee flexion    Knee extension    Ankle dorsiflexion  0/5  Ankle plantarflexion  0/5  Ankle inversion  0/5  Ankle eversion  0/5  (Blank rows = not tested)  TRANSFERS: Pull to stand, step pivot car transfers STS push off for step pivot transfers to/from power WC  GAIT:  RUE HW 3-point gait; has not been using his AFO at home, feel like this is only for when doing PT.   FUNCTIONAL TESTS:  -pull to stand, step pivot transfer from [facility] to plinth, car transfer simulation, minGuardA to supervision -STS transfer c RUE HW x1, then static stance x2 minutes half hands free, half with RUE support -STS to HW x5, minGuardA 18.1sec  -seated lateral scoot 1x16ft bilat c  AFO donned  -seated RUE reach to floor, then upright sitting x5 -STS from plinth to [facility], then AMB 4x34ft c HW and 3 turns -STS from plinth to [facility], then AMB 58ft,minGuardA                                                                                                                               TREATMENT DATE 03/06/24 :    TA:   -STS - RUE on mat- focusing on technique (pushing up from seated  surface vs. Reaching for device) x 10 -SPT- from power chair to edge of mat- usign HW, CGA,  AFO donned, gait belt- VC for scooting out to edge and placement of walker to side rather than in front if space allows. Patient perform 5 trials going back and forth from both sides (strong and affected side) -Static stand from EOM- using HW, CGA- VC to weight shift over toward affected left LE with some knee buckling. -Step tap onto 4 step with RLE - focusing on weight shifting on LLE using HW- Minimal assist and some left knee blocking due to buckling- 2 set of 10 in total. (Reminders to work on slowing down for more control and not just go fast to get 10 reps completed). Poor overall left knee control with weight bearing through LLE.   GAIT TRAINING -STS from plinth to [facility], then AMB  x 50 ft c HW, Close CGA and w/c follow and 3 turns x 2 trials today. On 1st trial- patient more impulsive requiring constant VC for walker placement (out to side rather than directly in front of RLE). Patient with improved gait sequencing on 2nd trial yet more fatigued requiring more VC for LLE placement- poor foot clearance at times yet no LOB- Constant VC to look ahead     PATIENT EDUCATION: Education details: Should be using AFO at home when mobilizing to improve safety, should also use insole over carbon fiber plate Person educated: Patient and Spouse Education method: collaborative learning, deliberate practice, positive reinforcement, explicit instruction, establish rules. Education comprehension: fair, difficult to  convince  HOME EXERCISE PROGRAM: None  GOALS: Goals reviewed with patient? No  SHORT TERM GOALS: Target date: 03/31/24  Pt to demonstrate 5xSTS ad lib fromstandard height in <14sec without LOB  Baseline: 18sec Goal status: INITIAL  2.  Pt to demonstrate ability to perform sustained standing dual tasking with intermittent support without LOB x5 minutes  Baseline: 2 min at eval  Goal status: INITIAL  3.  Pt to demonstrate >1105ft  Baseline:  Goal status: INITIAL  LONG TERM GOALS: Target date: 04/30/24  30sec chair rise, technique ad lib, >11x without LOB Baseline:  Goal status: INITIAL  2.  > 359ft without LOB with HW  Baseline:  Goal status: INITIAL  3.  Pt to reports consistent use of AFO in house when performing gait training.  Baseline:  Goal status: INITIAL  4.  Pt to report no falls in most recent 4 weeks.  Baseline: falls often, sometimes with bed transfers and without footwear. Goal status: INITIAL ASSESSMENT:  CLINICAL IMPRESSION: Patient arrived on wrong day but was abel to be seen for limited session. He presents with good motivation for today's session yet still impulsive at times. He was reminded that safety and technique are more important than any rep count or distance walked. He was able to follow cues for transfes and gait and exhibit improved step length and improved placement of hemi walker today. He requires closed CGA for all activities today. Patient will benefit from skilled physical therapy intervention to reduce deficits and impairments identified in evaluation, in order to reduce pain, improve quality of life, and maximize activity tolerance for ADL, IADL, and leisure/fitness. Physical therapy will help pt achieve long and short term goals of care.  OBJECTIVE IMPAIRMENTS: Decreased knowledge of condition, decreased use of DME, decreased mobility, difficulty walking, decreased strength, decreased ROM. ACTIVITY LIMITATIONS: Lifting, standing,  walking, squatting, transfers, locomotion level PARTICIPATION LIMITATIONS: Cleaning, laundry, interpersonal relationships, driving,  yardwork, community activity.  PERSONAL FACTORS: Age, behavior pattern, education, past/current experiences, transportation, profession  are also affecting patient's functional outcome.  REHAB POTENTIAL: Good CLINICAL DECISION MAKING: Medium  EVALUATION COMPLEXITY: Moderate    PLAN:  PT FREQUENCY: 1-2x/week  PT DURATION: 8 weeks  PLANNED INTERVENTIONS: 97110-Therapeutic exercises, 97530- Therapeutic activity, 97112- Neuromuscular re-education, 97535- Self Care, 02859- Manual therapy, [phone]- Gait training, [phone]- Orthotic Initial, Patient/Family education, Balance training, and Stair training  PLAN FOR NEXT SESSION: transfers and gait training   Reyes LOISE London, PT 03/06/2024, 4:49 PM Physical Therapist [place] [facility]

## 2024-03-07 ENCOUNTER — Ambulatory Visit: Admitting: Physical Therapy

## 2024-03-09 ENCOUNTER — Ambulatory Visit

## 2024-03-14 ENCOUNTER — Other Ambulatory Visit: Payer: Self-pay | Admitting: Internal Medicine

## 2024-03-14 ENCOUNTER — Ambulatory Visit: Admitting: Physical Therapy

## 2024-03-15 ENCOUNTER — Other Ambulatory Visit: Payer: Self-pay | Admitting: Internal Medicine

## 2024-03-16 ENCOUNTER — Ambulatory Visit

## 2024-03-21 ENCOUNTER — Ambulatory Visit: Admitting: Physical Therapy

## 2024-03-21 DIAGNOSIS — R2681 Unsteadiness on feet: Secondary | ICD-10-CM

## 2024-03-21 DIAGNOSIS — M6281 Muscle weakness (generalized): Secondary | ICD-10-CM | POA: Diagnosis not present

## 2024-03-21 DIAGNOSIS — R262 Difficulty in walking, not elsewhere classified: Secondary | ICD-10-CM

## 2024-03-21 DIAGNOSIS — R269 Unspecified abnormalities of gait and mobility: Secondary | ICD-10-CM

## 2024-03-21 DIAGNOSIS — R278 Other lack of coordination: Secondary | ICD-10-CM

## 2024-03-21 NOTE — Therapy (Unsigned)
 OUTPATIENT PHYSICAL THERAPY TREATMENT  Patient Name: Jacob [name] MRN: [id number] DOB:February 09, 1976, 48 y.o., male 39 Date: 03/22/2024  PCP: [name], MD REFERRING PROVIDER: [name], Mayo, MD  END OF SESSION:  PT End of Session - 03/21/24 0711     Visit Number 3    Number of Visits 16    Date for Recertification  04/25/24    Authorization Type BCBS    Progress Note Due on Visit 10    PT Start Time 1315    PT Stop Time 1355    PT Time Calculation (min) 40 min    Equipment Utilized During Treatment Gait belt    Activity Tolerance Patient tolerated treatment well    Behavior During Therapy WFL for tasks assessed/performed           Past Medical History:  Diagnosis Date   Allergy    Depression    Diabetes mellitus without complication (HCC)    GERD (gastroesophageal reflux disease)    Hypertension    Paralysis (HCC)    Seizures (HCC)    Stroke [facility])    Past Surgical History:  Procedure Laterality Date   APPENDECTOMY     BUBBLE STUDY  10/02/2021   Procedure: BUBBLE STUDY;  Surgeon: Okey Vina GAILS, MD;  Location: [facility] ENDOSCOPY;  Service: Cardiovascular;;   CRANIOPLASTY Right 02/09/2022   Procedure: Craniectomy with Replacement of Bone Flap From the Abdomen;  Surgeon: [name], MD;  Location: [facility] OR;  Service: Neurosurgery;  Laterality: Right;   CRANIOTOMY Right 09/15/2021   Procedure: RIGHT DECOMPRESSIVE CRANIOTOMY, PLACEMENT OF BONE FLAP IN ABDOMEN;  Surgeon: [name], MD;  Location: [facility] OR;  Service: Neurosurgery;  Laterality: Right;   IR CT HEAD LTD  09/12/2021   IR PERCUTANEOUS ART THROMBECTOMY/INFUSION INTRACRANIAL INC DIAG ANGIO  09/12/2021   IR US  GUIDE VASC ACCESS RIGHT  09/12/2021   LAPAROSCOPIC APPENDECTOMY  10/24/2013   Procedure: APPENDECTOMY LAPAROSCOPIC;  Surgeon: [name], MD;  Location: [facility] ORS;  Service: General;;   RADIOLOGY WITH ANESTHESIA N/A 09/12/2021   Procedure: IR WITH ANESTHESIA;  Surgeon: Dolphus Carrion, MD;   Location: [facility] OR;  Service: Radiology;  Laterality: N/A;   TEE WITHOUT CARDIOVERSION N/A 10/02/2021   Procedure: TRANSESOPHAGEAL ECHOCARDIOGRAM (TEE);  Surgeon: Okey Vina GAILS, MD;  Location: [facility] ENDOSCOPY;  Service: Cardiovascular;  Laterality: N/A;   Patient Active Problem List   Diagnosis Date Noted   Falls 09/06/2023   Hypercholesterolemia 09/06/2023   Weight loss 07/26/2023   Diabetes mellitus (HCC) 01/21/2023   Hypercholesteremia 01/21/2023   Gait abnormality 10/29/2022   Annual physical exam 09/22/2022   Cerebrovascular disease 09/22/2022   History of DVT (deep vein thrombosis) 09/22/2022   Spastic hemiplegia of left nondominant side as late effect of cerebral infarction (HCC) 09/22/2022   Seizure disorder (HCC) 09/22/2022   BMI 27.0-27.9,adult 09/22/2022   Mild episode of recurrent major depressive disorder 06/15/2022   Status post craniectomy 02/09/2022   Urinary retention 09/18/2021   Vitamin D  deficiency 11/04/2020   Hyperlipidemia 03/28/2018   Diabetes mellitus without complication (HCC) 02/23/2018   Essential hypertension 02/23/2018   ONSET DATE: 2023 REFERRING DIAG: Spastic Hemiplegia  THERAPY DIAG:  Muscle weakness (generalized)  Other lack of coordination  Difficulty in walking, not elsewhere classified  Abnormality of gait and mobility  Unsteadiness on feet  Rationale for Evaluation and Treatment: rehab  SUBJECTIVE:  SUBJECTIVE STATEMENT:  Pt reports that that he is doing well.  Has a slight HA would like his BP to be checked before mobility on this day.  No falls or issues over the weekend.   Pt accompanied by: friend   PERTINENT HISTORY:   48yoM with history of spastic Left hemiplegia s/p craniectomy. Pt previously in rehab at [facility], then at [facility] main for much of 2024.  Pt reports functional plateau at home, has remained modI to supervision for much of his ADL. Pt uses a power WC for most mobility at present, still trying to practice AMB in home with HW and with PCA ~3x months, <151ft typically. Pt sleeps in adjustable bed, reports at least a couple falls related to bed to/from [facility] transfers. Pt reports he has not been able to work on his strength and mobility as he would like since DC, feels like he is able to work harder and accomplish more in a PT setting, is referred back to continue to progress with all mobility and improve his general safety.  PAIN:  Are you having pain? No  PRECAUTIONS: Fall, restricted LUE   WEIGHT BEARING RESTRICTIONS: Yes NWB LUE  FALLS: Has patient fallen in last 6 months? Yes. Number of falls many  LIVING ENVIRONMENT: Lives with: wife, with PCA coverage Lives in: House/apartment Stairs: No Has following equipment at home: Quad cane large base, Hemi walker, Wheelchair (power), and Ramped entry  PATIENT GOALS: Get stronger, safer, more independent with ADL mobility  OBJECTIVE:  Note: Objective measures were completed at evaluation unless otherwise noted.  SENSATION: *Pt has known anesthesia of LLE of note given intermittent use of carbon fiber AFO  LOWER EXTREMITY MMT:    MMT Right Eval Left Eval  Knee flexion    Knee extension    Ankle dorsiflexion  0/5  Ankle plantarflexion  0/5  Ankle inversion  0/5  Ankle eversion  0/5  (Blank rows = not tested)  TRANSFERS: Pull to stand, step pivot car transfers STS push off for step pivot transfers to/from power WC  GAIT:  RUE HW 3-point gait; has not been using his AFO at home, feel like this is only for when doing PT.   FUNCTIONAL TESTS:  -pull to stand, step pivot transfer from [facility] to plinth, car transfer simulation, minGuardA to supervision -STS transfer c RUE HW x1, then static stance x2 minutes half hands free, half with RUE support -STS to HW x5, minGuardA 18.1sec   -seated lateral scoot 1x41ft bilat c AFO donned  -seated RUE reach to floor, then upright sitting x5 -STS from plinth to [facility], then AMB 4x41ft c HW and 3 turns -STS from plinth to [facility], then AMB 23ft,minGuardA                                                                                                                               TREATMENT DATE 03/22/24 :   Arrives in power WC.  Able to navigate gym, but min instruction for awareness or doorframes R and L.     BP assessed:  Seated: 145/100(113)  HR 60 Standing: 131/99 (110) HR 66 No change in HA while in standing   6 Min Walk Test:  Instructed patient to ambulate as quickly and as safely as possible for 6 minutes using LRAD. Patient was allowed to take standing rest breaks without stopping the test, but if the patient required a sitting rest break the clock would be stopped and the test would be over.  Results: 67 feet ) using a HW with min assist and WC follow from family for safety. Pr required seated rest break at 3:45 until 5:30 due to BLE fatigue.   Results indicate that the patient has reduced endurance with ambulation compared to age matched norms.  Age Matched Norms: 27-69 yo M: 46 F: 2, 59-79 yo M: 85 F: 471, 65-89 yo M: 417 F: 392 MDC: 58.21 meters (190.98 feet) or 50 meters (ANPTA Core Set of Outcome Measures for Adults with Neurologic Conditions, 2018)   Was able to complete 92ft total after 8 min and 1 seated rest break. Required tactile cues for posture and attention to LLE as well as satety concerns for awareness of chair positioning on the L side with turning to sit.   30 sec sit<>stand 6.5 with heavy UE support on the RUE . Was noted not to let go of arm rest on the R side.    Ambulatory transfer to power WC with HW and min assist for improved posture and cues for attention to the LLE in turns with managing leg rests on power WC   PATIENT EDUCATION: Education details: Should be using AFO at home when mobilizing to  improve safety, should also use insole over carbon fiber plate Person educated: Patient and Spouse Education method: collaborative learning, deliberate practice, positive reinforcement, explicit instruction, establish rules. Education comprehension: fair, difficult to convince  HOME EXERCISE PROGRAM: None  GOALS: Goals reviewed with patient? No  SHORT TERM GOALS: Target date: 03/31/24  Pt to demonstrate 5xSTS ad lib fromstandard height in <14sec without LOB  Baseline: 18sec Goal status: INITIAL  2.  Pt to demonstrate ability to perform sustained standing dual tasking with intermittent support without LOB x5 minutes  Baseline: 2 min at eval  Goal status: INITIAL  3.  Pt to demonstrate >140ft  Baseline:  Goal status: INITIAL  LONG TERM GOALS: Target date: 04/30/24  30sec chair rise, technique ad lib, >11x without LOB Baseline: 10/28: 6.5 with heavy UE support on the RUE . Was noted not to let go of arm rest on the R side.  Goal status: INITIAL  2.  > 38ft without LOB with HW  Baseline:10/28 64ft with 1 seated rest break Goal status: INITIAL  3.  Pt to reports consistent use of AFO in house when performing gait training.  Baseline: states that he does not wear it often because he doesn't walk much.  Goal status: INITIAL  4.  Pt to report no falls in most recent 4 weeks.  Baseline: falls often, sometimes with bed transfers and without footwear. Goal status: INITIAL ASSESSMENT:  CLINICAL IMPRESSION: Patient arrived motivated to participate. Pt that he has slight HA, but has not had much to eat prior to treatment session. Completed 6 min walk test limited to 67 ft with 1 seated rest break at 3:48min. 30 sec sit<>stand with heavy use of RUE for 6.5 reps. Continues to require  instruction for posture and attention to the RLE with safety in turns to sit in power WC. Patient will benefit from skilled physical therapy intervention to reduce deficits and impairments  identified in evaluation, in order to reduce pain, improve quality of life, and maximize activity tolerance for ADL, IADL, and leisure/fitness. Physical therapy will help pt achieve long and short term goals of care.     OBJECTIVE IMPAIRMENTS: Decreased knowledge of condition, decreased use of DME, decreased mobility, difficulty walking, decreased strength, decreased ROM. ACTIVITY LIMITATIONS: Lifting, standing, walking, squatting, transfers, locomotion level PARTICIPATION LIMITATIONS: Cleaning, laundry, interpersonal relationships, driving, yardwork, community activity.  PERSONAL FACTORS: Age, behavior pattern, education, past/current experiences, transportation, profession  are also affecting patient's functional outcome.  REHAB POTENTIAL: Good CLINICAL DECISION MAKING: Medium  EVALUATION COMPLEXITY: Moderate    PLAN:  PT FREQUENCY: 1-2x/week  PT DURATION: 8 weeks  PLANNED INTERVENTIONS: 97110-Therapeutic exercises, 97530- Therapeutic activity, [id number]- Neuromuscular re-education, 97535- Self Care, 02859- Manual therapy, [id number]- Gait training, [phone]- Orthotic Initial, Patient/Family education, Balance training, and Stair training  PLAN FOR NEXT SESSION: transfers and gait training   Massie FORBES Dollar, PT 03/22/2024, 7:12 AM

## 2024-03-23 ENCOUNTER — Ambulatory Visit

## 2024-03-27 ENCOUNTER — Ambulatory Visit: Admitting: Physical Therapy

## 2024-03-27 NOTE — Therapy (Incomplete)
 OUTPATIENT PHYSICAL THERAPY TREATMENT  Patient Name: Jacob [name] MRN: [id number] DOB:1975-12-17, 48 y.o., male 71 Date: 03/27/2024  PCP: [name], MD REFERRING PROVIDER: [name], Mayo, MD  END OF SESSION: ***   Past Medical History:  Diagnosis Date   Allergy    Depression    Diabetes mellitus without complication (HCC)    GERD (gastroesophageal reflux disease)    Hypertension    Paralysis (HCC)    Seizures (HCC)    Stroke [facility])    Past Surgical History:  Procedure Laterality Date   APPENDECTOMY     BUBBLE STUDY  10/02/2021   Procedure: BUBBLE STUDY;  Surgeon: Okey Vina GAILS, MD;  Location: [facility] ENDOSCOPY;  Service: Cardiovascular;;   CRANIOPLASTY Right 02/09/2022   Procedure: Craniectomy with Replacement of Bone Flap From the Abdomen;  Surgeon: [name], MD;  Location: [facility] OR;  Service: Neurosurgery;  Laterality: Right;   CRANIOTOMY Right 09/15/2021   Procedure: RIGHT DECOMPRESSIVE CRANIOTOMY, PLACEMENT OF BONE FLAP IN ABDOMEN;  Surgeon: [name], MD;  Location: [facility] OR;  Service: Neurosurgery;  Laterality: Right;   IR CT HEAD LTD  09/12/2021   IR PERCUTANEOUS ART THROMBECTOMY/INFUSION INTRACRANIAL INC DIAG ANGIO  09/12/2021   IR US  GUIDE VASC ACCESS RIGHT  09/12/2021   LAPAROSCOPIC APPENDECTOMY  10/24/2013   Procedure: APPENDECTOMY LAPAROSCOPIC;  Surgeon: [name], MD;  Location: [facility] ORS;  Service: General;;   RADIOLOGY WITH ANESTHESIA N/A 09/12/2021   Procedure: IR WITH ANESTHESIA;  Surgeon: Dolphus Carrion, MD;  Location: [facility] OR;  Service: Radiology;  Laterality: N/A;   TEE WITHOUT CARDIOVERSION N/A 10/02/2021   Procedure: TRANSESOPHAGEAL ECHOCARDIOGRAM (TEE);  Surgeon: Okey Vina GAILS, MD;  Location: [facility] ENDOSCOPY;  Service: Cardiovascular;  Laterality: N/A;   Patient Active Problem List   Diagnosis Date Noted   Falls 09/06/2023   Hypercholesterolemia 09/06/2023   Weight loss 07/26/2023   Diabetes mellitus (HCC) 01/21/2023   Hypercholesteremia  01/21/2023   Gait abnormality 10/29/2022   Annual physical exam 09/22/2022   Cerebrovascular disease 09/22/2022   History of DVT (deep vein thrombosis) 09/22/2022   Spastic hemiplegia of left nondominant side as late effect of cerebral infarction (HCC) 09/22/2022   Seizure disorder (HCC) 09/22/2022   BMI 27.0-27.9,adult 09/22/2022   Mild episode of recurrent major depressive disorder 06/15/2022   Status post craniectomy 02/09/2022   Urinary retention 09/18/2021   Vitamin D  deficiency 11/04/2020   Hyperlipidemia 03/28/2018   Diabetes mellitus without complication (HCC) 02/23/2018   Essential hypertension 02/23/2018   ONSET DATE: 2023 REFERRING DIAG: Spastic Hemiplegia  THERAPY DIAG: *** No diagnosis found.  Rationale for Evaluation and Treatment: rehab  SUBJECTIVE:  SUBJECTIVE STATEMENT: *** Pt reports that that he is doing well.  Has a slight HA would like his BP to be checked before mobility on this day.  No falls or issues over the weekend.   Pt accompanied by: friend   PERTINENT HISTORY:   48yoM with history of spastic Left hemiplegia s/p craniectomy. Pt previously in rehab at [facility], then at [facility] main for much of 2024. Pt reports functional plateau at home, has remained modI to supervision for much of his ADL. Pt uses a power WC for most mobility at present, still trying to practice AMB in home with HW and with PCA ~3x months, <132ft typically. Pt sleeps in adjustable bed, reports at least a couple falls related to bed to/from [facility] transfers. Pt reports he has not been able to work on his strength and mobility as he would like since DC, feels like he is able to work harder and accomplish more in a PT setting, is referred back to continue to progress with all mobility and improve his general  safety.  PAIN:  Are you having pain? No  PRECAUTIONS: Fall, restricted LUE   WEIGHT BEARING RESTRICTIONS: Yes NWB LUE  FALLS: Has patient fallen in last 6 months? Yes. Number of falls many  LIVING ENVIRONMENT: Lives with: wife, with PCA coverage Lives in: House/apartment Stairs: No Has following equipment at home: Quad cane large base, Hemi walker, Wheelchair (power), and Ramped entry  PATIENT GOALS: Get stronger, safer, more independent with ADL mobility  OBJECTIVE:  Note: Objective measures were completed at evaluation unless otherwise noted.  SENSATION: *Pt has known anesthesia of LLE of note given intermittent use of carbon fiber AFO  LOWER EXTREMITY MMT:    MMT Right Eval Left Eval  Knee flexion    Knee extension    Ankle dorsiflexion  0/5  Ankle plantarflexion  0/5  Ankle inversion  0/5  Ankle eversion  0/5  (Blank rows = not tested)  TRANSFERS: Pull to stand, step pivot car transfers STS push off for step pivot transfers to/from power WC  GAIT:  RUE HW 3-point gait; has not been using his AFO at home, feel like this is only for when doing PT.   FUNCTIONAL TESTS:  -pull to stand, step pivot transfer from [facility] to plinth, car transfer simulation, minGuardA to supervision -STS transfer c RUE HW x1, then static stance x2 minutes half hands free, half with RUE support -STS to HW x5, minGuardA 18.1sec  -seated lateral scoot 1x43ft bilat c AFO donned  -seated RUE reach to floor, then upright sitting x5 -STS from plinth to [facility], then AMB 4x59ft c HW and 3 turns -STS from plinth to [facility], then AMB 9ft,minGuardA                                                                                                                               TREATMENT DATE 03/27/24 :   ***  Arrives in power  WC. Able to navigate gym, but min instruction for awareness or doorframes R and L.     BP assessed:  Seated: 145/100(113)  HR 60 Standing: 131/99 (110) HR 66 No change in HA while in  standing   6 Min Walk Test:  Instructed patient to ambulate as quickly and as safely as possible for 6 minutes using LRAD. Patient was allowed to take standing rest breaks without stopping the test, but if the patient required a sitting rest break the clock would be stopped and the test would be over.  Results: 67 feet ) using a HW with min assist and WC follow from family for safety. Pr required seated rest break at 3:45 until 5:30 due to BLE fatigue.   Results indicate that the patient has reduced endurance with ambulation compared to age matched norms.  Age Matched Norms: 65-69 yo M: 44 F: 76, 15-79 yo M: 7 F: 471, 30-89 yo M: 417 F: 392 MDC: 58.21 meters (190.98 feet) or 50 meters (ANPTA Core Set of Outcome Measures for Adults with Neurologic Conditions, 2018)   Was able to complete 19ft total after 8 min and 1 seated rest break. Required tactile cues for posture and attention to LLE as well as satety concerns for awareness of chair positioning on the L side with turning to sit.   30 sec sit<>stand 6.5 with heavy UE support on the RUE . Was noted not to let go of arm rest on the R side.    Ambulatory transfer to power WC with HW and min assist for improved posture and cues for attention to the LLE in turns with managing leg rests on power WC   PATIENT EDUCATION: Education details: Should be using AFO at home when mobilizing to improve safety, should also use insole over carbon fiber plate Person educated: Patient and Spouse Education method: collaborative learning, deliberate practice, positive reinforcement, explicit instruction, establish rules. Education comprehension: fair, difficult to convince  HOME EXERCISE PROGRAM: None  GOALS: Goals reviewed with patient? No  SHORT TERM GOALS: Target date: 03/31/24  Pt to demonstrate 5xSTS ad lib fromstandard height in <14sec without LOB  Baseline: 18sec Goal status: INITIAL  2.  Pt to demonstrate ability to perform sustained  standing dual tasking with intermittent support without LOB x5 minutes  Baseline: 2 min at eval  Goal status: INITIAL  3.  Pt to demonstrate >167ft  Baseline:  Goal status: INITIAL  LONG TERM GOALS: Target date: 04/30/24  30sec chair rise, technique ad lib, >11x without LOB Baseline: 10/28: 6.5 with heavy UE support on the RUE . Was noted not to let go of arm rest on the R side.  Goal status: INITIAL  2.  > 366ft without LOB with HW  Baseline:10/28 42ft with 1 seated rest break Goal status: INITIAL  3.  Pt to reports consistent use of AFO in house when performing gait training.  Baseline: states that he does not wear it often because he doesn't walk much.  Goal status: INITIAL  4.  Pt to report no falls in most recent 4 weeks.  Baseline: falls often, sometimes with bed transfers and without footwear. Goal status: INITIAL ASSESSMENT:  CLINICAL IMPRESSION: *** Patient arrived motivated to participate. Pt that he has slight HA, but has not had much to eat prior to treatment session. Completed 6 min walk test limited to 67 ft with 1 seated rest break at 3:51min. 30 sec sit<>stand with heavy use of RUE for 6.5 reps.  Continues to require instruction for posture and attention to the RLE with safety in turns to sit in power WC. Patient will benefit from skilled physical therapy intervention to reduce deficits and impairments identified in evaluation, in order to reduce pain, improve quality of life, and maximize activity tolerance for ADL, IADL, and leisure/fitness. Physical therapy will help pt achieve long and short term goals of care.     OBJECTIVE IMPAIRMENTS: Decreased knowledge of condition, decreased use of DME, decreased mobility, difficulty walking, decreased strength, decreased ROM. ACTIVITY LIMITATIONS: Lifting, standing, walking, squatting, transfers, locomotion level PARTICIPATION LIMITATIONS: Cleaning, laundry, interpersonal relationships, driving, yardwork, community  activity.  PERSONAL FACTORS: Age, behavior pattern, education, past/current experiences, transportation, profession  are also affecting patient's functional outcome.  REHAB POTENTIAL: Good CLINICAL DECISION MAKING: Medium  EVALUATION COMPLEXITY: Moderate    PLAN:  PT FREQUENCY: 1-2x/week  PT DURATION: 8 weeks  PLANNED INTERVENTIONS: 97110-Therapeutic exercises, 97530- Therapeutic activity, [id number]- Neuromuscular re-education, 97535- Self Care, 02859- Manual therapy, [phone]- Gait training, [phone]- Orthotic Initial, Patient/Family education, Balance training, and Stair training  PLAN FOR NEXT SESSION:  *** transfers and gait training   Chiquita Silvan, Student-PT 03/27/2024, 7:55 AM

## 2024-03-28 ENCOUNTER — Ambulatory Visit

## 2024-03-29 ENCOUNTER — Ambulatory Visit: Admitting: Physical Therapy

## 2024-03-29 NOTE — Therapy (Deleted)
 OUTPATIENT PHYSICAL THERAPY TREATMENT  Patient Name: KEONDRICK DILKS MRN: [id number] DOB:1975/09/08, 48 y.o., male 46 Date: 03/29/2024  PCP: [name], MD REFERRING PROVIDER: [name], Mayo, MD  END OF SESSION:  PT End of Session - 03/21/24 0711     Visit Number 3    Number of Visits 16    Date for Recertification  04/25/24    Authorization Type BCBS    Progress Note Due on Visit 10    PT Start Time 1315    PT Stop Time 1355    PT Time Calculation (min) 40 min    Equipment Utilized During Treatment Gait belt    Activity Tolerance Patient tolerated treatment well    Behavior During Therapy WFL for tasks assessed/performed           Past Medical History:  Diagnosis Date   Allergy    Depression    Diabetes mellitus without complication (HCC)    GERD (gastroesophageal reflux disease)    Hypertension    Paralysis (HCC)    Seizures (HCC)    Stroke [facility])    Past Surgical History:  Procedure Laterality Date   APPENDECTOMY     BUBBLE STUDY  10/02/2021   Procedure: BUBBLE STUDY;  Surgeon: Okey Vina GAILS, MD;  Location: [facility] ENDOSCOPY;  Service: Cardiovascular;;   CRANIOPLASTY Right 02/09/2022   Procedure: Craniectomy with Replacement of Bone Flap From the Abdomen;  Surgeon: [name], MD;  Location: [facility] OR;  Service: Neurosurgery;  Laterality: Right;   CRANIOTOMY Right 09/15/2021   Procedure: RIGHT DECOMPRESSIVE CRANIOTOMY, PLACEMENT OF BONE FLAP IN ABDOMEN;  Surgeon: [name], MD;  Location: [facility] OR;  Service: Neurosurgery;  Laterality: Right;   IR CT HEAD LTD  09/12/2021   IR PERCUTANEOUS ART THROMBECTOMY/INFUSION INTRACRANIAL INC DIAG ANGIO  09/12/2021   IR US  GUIDE VASC ACCESS RIGHT  09/12/2021   LAPAROSCOPIC APPENDECTOMY  10/24/2013   Procedure: APPENDECTOMY LAPAROSCOPIC;  Surgeon: [name], MD;  Location: [facility] ORS;  Service: General;;   RADIOLOGY WITH ANESTHESIA N/A 09/12/2021   Procedure: IR WITH ANESTHESIA;  Surgeon: Dolphus Carrion, MD;  Location:  [facility] OR;  Service: Radiology;  Laterality: N/A;   TEE WITHOUT CARDIOVERSION N/A 10/02/2021   Procedure: TRANSESOPHAGEAL ECHOCARDIOGRAM (TEE);  Surgeon: Okey Vina GAILS, MD;  Location: [facility] ENDOSCOPY;  Service: Cardiovascular;  Laterality: N/A;   Patient Active Problem List   Diagnosis Date Noted   Falls 09/06/2023   Hypercholesterolemia 09/06/2023   Weight loss 07/26/2023   Diabetes mellitus (HCC) 01/21/2023   Hypercholesteremia 01/21/2023   Gait abnormality 10/29/2022   Annual physical exam 09/22/2022   Cerebrovascular disease 09/22/2022   History of DVT (deep vein thrombosis) 09/22/2022   Spastic hemiplegia of left nondominant side as late effect of cerebral infarction (HCC) 09/22/2022   Seizure disorder (HCC) 09/22/2022   BMI 27.0-27.9,adult 09/22/2022   Mild episode of recurrent major depressive disorder 06/15/2022   Status post craniectomy 02/09/2022   Urinary retention 09/18/2021   Vitamin D  deficiency 11/04/2020   Hyperlipidemia 03/28/2018   Diabetes mellitus without complication (HCC) 02/23/2018   Essential hypertension 02/23/2018   ONSET DATE: 2023 REFERRING DIAG: Spastic Hemiplegia  THERAPY DIAG:  Muscle weakness (generalized)  Difficulty in walking, not elsewhere classified  Abnormality of gait and mobility  Unsteadiness on feet  Rationale for Evaluation and Treatment: rehab  SUBJECTIVE:  SUBJECTIVE STATEMENT:  Pt reports that that he is doing well.  Has a slight HA would like his BP to be checked before mobility on this day.  No falls or issues over the weekend.   Pt accompanied by: friend   PERTINENT HISTORY:   48yoM with history of spastic Left hemiplegia s/p craniectomy. Pt previously in rehab at [facility], then at [facility] main for much of 2024. Pt reports functional plateau at home,  has remained modI to supervision for much of his ADL. Pt uses a power WC for most mobility at present, still trying to practice AMB in home with HW and with PCA ~3x months, <168ft typically. Pt sleeps in adjustable bed, reports at least a couple falls related to bed to/from [facility] transfers. Pt reports he has not been able to work on his strength and mobility as he would like since DC, feels like he is able to work harder and accomplish more in a PT setting, is referred back to continue to progress with all mobility and improve his general safety.  PAIN:  Are you having pain? No  PRECAUTIONS: Fall, restricted LUE   WEIGHT BEARING RESTRICTIONS: Yes NWB LUE  FALLS: Has patient fallen in last 6 months? Yes. Number of falls many  LIVING ENVIRONMENT: Lives with: wife, with PCA coverage Lives in: House/apartment Stairs: No Has following equipment at home: Quad cane large base, Hemi walker, Wheelchair (power), and Ramped entry  PATIENT GOALS: Get stronger, safer, more independent with ADL mobility  OBJECTIVE:  Note: Objective measures were completed at evaluation unless otherwise noted.  SENSATION: *Pt has known anesthesia of LLE of note given intermittent use of carbon fiber AFO  LOWER EXTREMITY MMT:    MMT Right Eval Left Eval  Knee flexion    Knee extension    Ankle dorsiflexion  0/5  Ankle plantarflexion  0/5  Ankle inversion  0/5  Ankle eversion  0/5  (Blank rows = not tested)  TRANSFERS: Pull to stand, step pivot car transfers STS push off for step pivot transfers to/from power WC  GAIT:  RUE HW 3-point gait; has not been using his AFO at home, feel like this is only for when doing PT.   FUNCTIONAL TESTS:  -pull to stand, step pivot transfer from [facility] to plinth, car transfer simulation, minGuardA to supervision -STS transfer c RUE HW x1, then static stance x2 minutes half hands free, half with RUE support -STS to HW x5, minGuardA 18.1sec  -seated lateral scoot 1x56ft bilat c  AFO donned  -seated RUE reach to floor, then upright sitting x5 -STS from plinth to [facility], then AMB 4x52ft c HW and 3 turns -STS from plinth to [facility], then AMB 17ft,minGuardA                                                                                                                               TREATMENT DATE 03/29/24 :   Arrives in power WC.  Able to navigate gym, but min instruction for awareness or doorframes R and L.     BP assessed:  Seated: 145/100(113)  HR 60 Standing: 131/99 (110) HR 66 No change in HA while in standing   6 Min Walk Test:  Instructed patient to ambulate as quickly and as safely as possible for 6 minutes using LRAD. Patient was allowed to take standing rest breaks without stopping the test, but if the patient required a sitting rest break the clock would be stopped and the test would be over.  Results: 67 feet ) using a HW with min assist and WC follow from family for safety. Pr required seated rest break at 3:45 until 5:30 due to BLE fatigue.   Results indicate that the patient has reduced endurance with ambulation compared to age matched norms.  Age Matched Norms: 11-69 yo M: 60 F: 52, 81-79 yo M: 23 F: 471, 55-89 yo M: 417 F: 392 MDC: 58.21 meters (190.98 feet) or 50 meters (ANPTA Core Set of Outcome Measures for Adults with Neurologic Conditions, 2018)   Was able to complete 16ft total after 8 min and 1 seated rest break. Required tactile cues for posture and attention to LLE as well as satety concerns for awareness of chair positioning on the L side with turning to sit.   30 sec sit<>stand 6.5 with heavy UE support on the RUE . Was noted not to let go of arm rest on the R side.    Ambulatory transfer to power WC with HW and min assist for improved posture and cues for attention to the LLE in turns with managing leg rests on power WC   PATIENT EDUCATION: Education details: Should be using AFO at home when mobilizing to improve safety, should also use insole  over carbon fiber plate Person educated: Patient and Spouse Education method: collaborative learning, deliberate practice, positive reinforcement, explicit instruction, establish rules. Education comprehension: fair, difficult to convince  HOME EXERCISE PROGRAM: None  GOALS: Goals reviewed with patient? No  SHORT TERM GOALS: Target date: 03/31/24  Pt to demonstrate 5xSTS ad lib fromstandard height in <14sec without LOB  Baseline: 18sec Goal status: INITIAL  2.  Pt to demonstrate ability to perform sustained standing dual tasking with intermittent support without LOB x5 minutes  Baseline: 2 min at eval  Goal status: INITIAL  3.  Pt to demonstrate >180ft  Baseline:  Goal status: INITIAL  LONG TERM GOALS: Target date: 04/30/24  30sec chair rise, technique ad lib, >11x without LOB Baseline: 10/28: 6.5 with heavy UE support on the RUE . Was noted not to let go of arm rest on the R side.  Goal status: INITIAL  2.  > 373ft without LOB with HW  Baseline:10/28 56ft with 1 seated rest break Goal status: INITIAL  3.  Pt to reports consistent use of AFO in house when performing gait training.  Baseline: states that he does not wear it often because he doesn't walk much.  Goal status: INITIAL  4.  Pt to report no falls in most recent 4 weeks.  Baseline: falls often, sometimes with bed transfers and without footwear. Goal status: INITIAL ASSESSMENT:  CLINICAL IMPRESSION: Patient arrived motivated to participate. Pt that he has slight HA, but has not had much to eat prior to treatment session. Completed 6 min walk test limited to 67 ft with 1 seated rest break at 3:82min. 30 sec sit<>stand with heavy use of RUE for 6.5 reps. Continues to require  instruction for posture and attention to the RLE with safety in turns to sit in power WC. Patient will benefit from skilled physical therapy intervention to reduce deficits and impairments identified in evaluation, in order to reduce  pain, improve quality of life, and maximize activity tolerance for ADL, IADL, and leisure/fitness. Physical therapy will help pt achieve long and short term goals of care.     OBJECTIVE IMPAIRMENTS: Decreased knowledge of condition, decreased use of DME, decreased mobility, difficulty walking, decreased strength, decreased ROM. ACTIVITY LIMITATIONS: Lifting, standing, walking, squatting, transfers, locomotion level PARTICIPATION LIMITATIONS: Cleaning, laundry, interpersonal relationships, driving, yardwork, community activity.  PERSONAL FACTORS: Age, behavior pattern, education, past/current experiences, transportation, profession  are also affecting patient's functional outcome.  REHAB POTENTIAL: Good CLINICAL DECISION MAKING: Medium  EVALUATION COMPLEXITY: Moderate    PLAN:  PT FREQUENCY: 1-2x/week  PT DURATION: 8 weeks  PLANNED INTERVENTIONS: 97110-Therapeutic exercises, 97530- Therapeutic activity, [id number]- Neuromuscular re-education, 97535- Self Care, 02859- Manual therapy, [phone]- Gait training, [phone]- Orthotic Initial, Patient/Family education, Balance training, and Stair training  PLAN FOR NEXT SESSION: transfers and gait training   Lonni KATHEE Gainer, PT 03/29/2024, 9:12 AM

## 2024-03-30 ENCOUNTER — Ambulatory Visit

## 2024-03-30 ENCOUNTER — Ambulatory Visit: Attending: Internal Medicine | Admitting: Physical Therapy

## 2024-03-30 DIAGNOSIS — R2681 Unsteadiness on feet: Secondary | ICD-10-CM | POA: Insufficient documentation

## 2024-03-30 DIAGNOSIS — M6281 Muscle weakness (generalized): Secondary | ICD-10-CM | POA: Diagnosis present

## 2024-03-30 DIAGNOSIS — R278 Other lack of coordination: Secondary | ICD-10-CM | POA: Insufficient documentation

## 2024-03-30 DIAGNOSIS — R269 Unspecified abnormalities of gait and mobility: Secondary | ICD-10-CM | POA: Diagnosis present

## 2024-03-30 DIAGNOSIS — R262 Difficulty in walking, not elsewhere classified: Secondary | ICD-10-CM | POA: Diagnosis present

## 2024-03-30 NOTE — Therapy (Unsigned)
 OUTPATIENT PHYSICAL THERAPY TREATMENT  Patient Name: Jacob [name] MRN: [id number] DOB:24-Jan-1976, 48 y.o., male 75 Date: 03/30/2024  PCP: [name], MD REFERRING PROVIDER: [name], Mayo, MD   PT End of Session - 03/30/24 1120     Visit Number 4    Number of Visits 16    Date for Recertification  04/25/24    Authorization Type BCBS    Progress Note Due on Visit 10    PT Start Time 1106    PT Stop Time 1145    PT Time Calculation (min) 39 min    Equipment Utilized During Treatment Gait belt    Activity Tolerance Patient tolerated treatment well    Behavior During Therapy WFL for tasks assessed/performed           Past Medical History:  Diagnosis Date   Allergy    Depression    Diabetes mellitus without complication (HCC)    GERD (gastroesophageal reflux disease)    Hypertension    Paralysis (HCC)    Seizures (HCC)    Stroke [facility])    Past Surgical History:  Procedure Laterality Date   APPENDECTOMY     BUBBLE STUDY  10/02/2021   Procedure: BUBBLE STUDY;  Surgeon: Okey Vina GAILS, MD;  Location: [facility] ENDOSCOPY;  Service: Cardiovascular;;   CRANIOPLASTY Right 02/09/2022   Procedure: Craniectomy with Replacement of Bone Flap From the Abdomen;  Surgeon: [name], MD;  Location: [facility] OR;  Service: Neurosurgery;  Laterality: Right;   CRANIOTOMY Right 09/15/2021   Procedure: RIGHT DECOMPRESSIVE CRANIOTOMY, PLACEMENT OF BONE FLAP IN ABDOMEN;  Surgeon: [name], MD;  Location: [facility] OR;  Service: Neurosurgery;  Laterality: Right;   IR CT HEAD LTD  09/12/2021   IR PERCUTANEOUS ART THROMBECTOMY/INFUSION INTRACRANIAL INC DIAG ANGIO  09/12/2021   IR US  GUIDE VASC ACCESS RIGHT  09/12/2021   LAPAROSCOPIC APPENDECTOMY  10/24/2013   Procedure: APPENDECTOMY LAPAROSCOPIC;  Surgeon: [name], MD;  Location: [facility] ORS;  Service: General;;   RADIOLOGY WITH ANESTHESIA N/A 09/12/2021   Procedure: IR WITH ANESTHESIA;  Surgeon: Dolphus Carrion, MD;  Location: [facility] OR;  Service:  Radiology;  Laterality: N/A;   TEE WITHOUT CARDIOVERSION N/A 10/02/2021   Procedure: TRANSESOPHAGEAL ECHOCARDIOGRAM (TEE);  Surgeon: Okey Vina GAILS, MD;  Location: [facility] ENDOSCOPY;  Service: Cardiovascular;  Laterality: N/A;   Patient Active Problem List   Diagnosis Date Noted   Falls 09/06/2023   Hypercholesterolemia 09/06/2023   Weight loss 07/26/2023   Diabetes mellitus (HCC) 01/21/2023   Hypercholesteremia 01/21/2023   Gait abnormality 10/29/2022   Annual physical exam 09/22/2022   Cerebrovascular disease 09/22/2022   History of DVT (deep vein thrombosis) 09/22/2022   Spastic hemiplegia of left nondominant side as late effect of cerebral infarction (HCC) 09/22/2022   Seizure disorder (HCC) 09/22/2022   BMI 27.0-27.9,adult 09/22/2022   Mild episode of recurrent major depressive disorder 06/15/2022   Status post craniectomy 02/09/2022   Urinary retention 09/18/2021   Vitamin D  deficiency 11/04/2020   Hyperlipidemia 03/28/2018   Diabetes mellitus without complication (HCC) 02/23/2018   Essential hypertension 02/23/2018   ONSET DATE: 2023 REFERRING DIAG: Spastic Hemiplegia  THERAPY DIAG:  Muscle weakness (generalized)  Other lack of coordination  Difficulty in walking, not elsewhere classified  Abnormality of gait and mobility  Unsteadiness on feet  Rationale for Evaluation and Treatment: rehab  SUBJECTIVE:  SUBJECTIVE STATEMENT:  Pt reports that that he is doing well.  Has a slight HA would like his BP to be checked before mobility on this day.  No falls or issues over the weekend.   Pt accompanied by: friend   PERTINENT HISTORY:   48yoM with history of spastic Left hemiplegia s/p craniectomy. Pt previously in rehab at [facility], then at [facility] main for much of 2024. Pt reports functional  plateau at home, has remained modI to supervision for much of his ADL. Pt uses a power WC for most mobility at present, still trying to practice AMB in home with HW and with PCA ~3x months, <139ft typically. Pt sleeps in adjustable bed, reports at least a couple falls related to bed to/from [facility] transfers. Pt reports he has not been able to work on his strength and mobility as he would like since DC, feels like he is able to work harder and accomplish more in a PT setting, is referred back to continue to progress with all mobility and improve his general safety.  PAIN:  Are you having pain? No  PRECAUTIONS: Fall, restricted LUE   WEIGHT BEARING RESTRICTIONS: Yes NWB LUE  FALLS: Has patient fallen in last 6 months? Yes. Number of falls many  LIVING ENVIRONMENT: Lives with: wife, with PCA coverage Lives in: House/apartment Stairs: No Has following equipment at home: Quad cane large base, Hemi walker, Wheelchair (power), and Ramped entry  PATIENT GOALS: Get stronger, safer, more independent with ADL mobility  OBJECTIVE:  Note: Objective measures were completed at evaluation unless otherwise noted.  SENSATION: *Pt has known anesthesia of LLE of note given intermittent use of carbon fiber AFO  LOWER EXTREMITY MMT:    MMT Right Eval Left Eval  Knee flexion    Knee extension    Ankle dorsiflexion  0/5  Ankle plantarflexion  0/5  Ankle inversion  0/5  Ankle eversion  0/5  (Blank rows = not tested)  TRANSFERS: Pull to stand, step pivot car transfers STS push off for step pivot transfers to/from power WC  GAIT:  RUE HW 3-point gait; has not been using his AFO at home, feel like this is only for when doing PT.   FUNCTIONAL TESTS:  -pull to stand, step pivot transfer from [facility] to plinth, car transfer simulation, minGuardA to supervision -STS transfer c RUE HW x1, then static stance x2 minutes half hands free, half with RUE support -STS to HW x5, minGuardA 18.1sec  -seated lateral  scoot 1x86ft bilat c AFO donned  -seated RUE reach to floor, then upright sitting x5 -STS from plinth to [facility], then AMB 4x31ft c HW and 3 turns -STS from plinth to [facility], then AMB 74ft,minGuardA                                                                                                                               TREATMENT DATE 03/30/24:    Arrives in power WC.  Improved awareness for navigation of door frames in chair today.   Stand pivot transfer to arm chair with HW. Mod assist to block knee due to instability on this day.   PT assisted to don AFO with total A.  Sit<>stand from arm chair with min assist to force WB through the LLE.  Gait with AFO x 61ft with 2, 180deg turns. Mod assist progressing to min assist due to poor activation of knee extensors on this day. Multiple instances of knee buckling with assist from PT to prevent fall with stance on L side as well as moderate cues for improved heel contact to improve AFO's assist into knee extension in stance.   Foot tap on 6inch step. Performed x 6 bil then x 5 bil reciprocal; heavy RUE support on rail. Mod-max assist throughout from PT to facilitate knee and hip extension on the L side to prevent buckling and max verbal instruction for increased attention to the LLE. Was noted to have slightly improved knee extension for last 4 repetitions.   Forward reverse gait with HW and AFO 33ft x 2 with moderate cues from PT for step length and posture; significantly improved knee extension in stance on the RLE compared to first bout of gait, requiring only min assist from PT for knee extension and no buckling noted.   Side stepping R and L, with HW and min assist from PT 2 x 6ft bil. Therapeutic rest break between bouts. Only 1 mild knee buckling noted initially on the LLE, then increased extension noted in stance so tactile cues were provided to increase consistency of knee extension and min assist for lateral stepping on the L.    Stand pivot  transfer to power WC with HW and min assist for improved posture and cues for attention to the LLE in turns with managing leg rests on power WC   PATIENT EDUCATION: Education details: Should be using AFO at home when mobilizing to improve safety, should also use insole over carbon fiber plate Person educated: Patient and Spouse Education method: collaborative learning, deliberate practice, positive reinforcement, explicit instruction, establish rules. Education comprehension: fair, difficult to convince  HOME EXERCISE PROGRAM: None  GOALS: Goals reviewed with patient? No  SHORT TERM GOALS: Target date: 03/31/24  Pt to demonstrate 5xSTS ad lib fromstandard height in <14sec without LOB  Baseline: 18sec Goal status: INITIAL  2.  Pt to demonstrate ability to perform sustained standing dual tasking with intermittent support without LOB x5 minutes  Baseline: 2 min at eval  Goal status: INITIAL  3.  Pt to demonstrate >168ft  Baseline:  Goal status: INITIAL  LONG TERM GOALS: Target date: 04/30/24  30sec chair rise, technique ad lib, >11x without LOB Baseline: 10/28: 6.5 with heavy UE support on the RUE . Was noted not to let go of arm rest on the R side.  Goal status: INITIAL  2.  > 374ft without LOB with HW  Baseline:10/28 35ft with 1 seated rest break Goal status: INITIAL  3.  Pt to reports consistent use of AFO in house when performing gait training.  Baseline: states that he does not wear it often because he doesn't walk much.  Goal status: INITIAL  4.  Pt to report no falls in most recent 4 weeks.  Baseline: falls often, sometimes with bed transfers and without footwear. Goal status: INITIAL ASSESSMENT:  CLINICAL IMPRESSION: Patient arrived motivated to participate. PT treatment focused on improved WB in stance and gait with AFO and  HW on this day. Pt noted to have significantly reduced knee extension activation initially, resulting in multiple instances of  buckling requiring mod assist from PT to prevent fall on first 2 bouts of gait training. With mod-max assist from PT to force full knee and hip extension with foot tap on stairs, pt able to demonstrate increased gluteal and hip activation. Pt Then was able to progress to CGA-min assist for gait in variable directions, with only tactile assist to engage knee extension in stance.  Patient will benefit from skilled physical therapy intervention to reduce deficits and impairments identified in evaluation, in order to reduce pain, improve quality of life, and maximize activity tolerance for ADL, IADL, and leisure/fitness.     OBJECTIVE IMPAIRMENTS: Decreased knowledge of condition, decreased use of DME, decreased mobility, difficulty walking, decreased strength, decreased ROM. ACTIVITY LIMITATIONS: Lifting, standing, walking, squatting, transfers, locomotion level PARTICIPATION LIMITATIONS: Cleaning, laundry, interpersonal relationships, driving, yardwork, community activity.  PERSONAL FACTORS: Age, behavior pattern, education, past/current experiences, transportation, profession  are also affecting patient's functional outcome.  REHAB POTENTIAL: Good CLINICAL DECISION MAKING: Medium  EVALUATION COMPLEXITY: Moderate    PLAN:  PT FREQUENCY: 1-2x/week  PT DURATION: 8 weeks  PLANNED INTERVENTIONS: 97110-Therapeutic exercises, 97530- Therapeutic activity, [id number]- Neuromuscular re-education, 97535- Self Care, 02859- Manual therapy, [phone]- Gait training, [phone]- Orthotic Initial, Patient/Family education, Balance training, and Stair training  PLAN FOR NEXT SESSION: transfers and gait training   Massie FORBES Dollar, PT 03/30/2024, 11:21 AM

## 2024-04-03 ENCOUNTER — Ambulatory Visit: Admitting: Internal Medicine

## 2024-04-04 ENCOUNTER — Ambulatory Visit: Admitting: Physical Therapy

## 2024-04-04 ENCOUNTER — Other Ambulatory Visit: Payer: Self-pay

## 2024-04-04 ENCOUNTER — Ambulatory Visit

## 2024-04-04 ENCOUNTER — Ambulatory Visit
Admission: RE | Admit: 2024-04-04 | Discharge: 2024-04-04 | Disposition: A | Discharge status: Home / Self Care | Source: Ambulatory Visit | Attending: Internal Medicine | Admitting: Internal Medicine

## 2024-04-04 VITALS — BP 120/76 | HR 69 | Temp 99.0°F | Resp 17

## 2024-04-04 DIAGNOSIS — M6281 Muscle weakness (generalized): Secondary | ICD-10-CM | POA: Diagnosis not present

## 2024-04-04 DIAGNOSIS — I10 Essential (primary) hypertension: Secondary | ICD-10-CM

## 2024-04-04 DIAGNOSIS — R2681 Unsteadiness on feet: Secondary | ICD-10-CM

## 2024-04-04 DIAGNOSIS — R269 Unspecified abnormalities of gait and mobility: Secondary | ICD-10-CM

## 2024-04-04 DIAGNOSIS — R519 Headache, unspecified: Secondary | ICD-10-CM | POA: Diagnosis not present

## 2024-04-04 DIAGNOSIS — R262 Difficulty in walking, not elsewhere classified: Secondary | ICD-10-CM

## 2024-04-04 NOTE — Discharge Instructions (Addendum)
 VISIT SUMMARY:  You came in today because of fluctuating blood pressure and head pain following your craniotomy in 2023. Your blood pressure has been high recently, and you have been experiencing a constant, achy pain in the back of your head. You have also had several falls over the past few years, with the most recent one occurring about a week and a half ago.  YOUR PLAN:  -HYPERTENSION WITH ASSOCIATED HEADACHE: Hypertension means high blood pressure, which can sometimes cause headaches. Your blood pressure has been fluctuating, and you have a constant, achy headache in the back of your head. You should continue taking amlodipine  5 mg daily to manage your blood pressure. If you experience dizziness or lightheadedness, reduce the dose to 2.5 mg. You can continue Tylenol  for pain as well. Please monitor your blood pressure regularly and follow up with your primary care doctor for long-term management.  -RECENT FALLS WITH HEAD IMPACT: You have had multiple falls over the past three years, with the most recent one occurring about a week and a half ago. Although there are no immediate signs of serious issues, there is a concern about potential complications from your previous craniotomy. You should discuss with your primary care provider about possibly getting imaging done to check for any skull shift or other complications from the falls.  INSTRUCTIONS:  Please follow up with your primary care doctor for long-term blood pressure management and to discuss the possibility of getting imaging done to assess for shifting if this is still a concern.  If you start to have more severe symptoms such as difficulty speaking, facial drooping, weakness on one side of the body, severe headache please go to the emergency room as these could be signs of a medical emergency.

## 2024-04-04 NOTE — ED Triage Notes (Signed)
 Pt presents with a chief complaint of intermittent headaches x 1 week. Currently rates overall head pain a 3/10. Describes as throbbing. Tylenol  taken at home with improvement. Denies sick contacts. States his BP has been elevated lately.

## 2024-04-04 NOTE — ED Provider Notes (Signed)
 [facility]    CSN: [id number] Arrival date & time: 04/04/24  1525      History   Chief Complaint Chief Complaint  Patient presents with   Headache    Headache lasting for 6 days - Entered by patient    HPI Jacob [name] is a 48 y.o. male.  has a past medical history of Allergy, Depression, Diabetes mellitus without complication (HCC), GERD (gastroesophageal reflux disease), Hypertension, Paralysis (HCC), Seizures (HCC), and Stroke (HCC).   HPI  Discussed the use of AI scribe software for clinical note transcription with the patient, who gave verbal consent to proceed.  The patient presents with fluctuating blood pressure and head pain following a craniotomy in 2023. He is accompanied by a family member.  He has experienced fluctuating blood pressure readings over the past few days, with measurements reaching as high as 196/114 mmHg and as low as 178/102 mmHg. He began tracking his blood pressure last Thursday. He is currently taking amlodipine , which was restarted by his family member after it was previously discontinued due to low blood pressure.  He experiences pain in the back of his head, described as an achy pain rated 3/10 in intensity. This pain has been present over the last 4-5 days. He has fallen several times over the past three years, with the most recent fall occurring about a week and a half ago, during which he hit his head.  He states that he lost his balance and fell with his back up against the wall and then slid to a sitting position.  He states that during this fall he did not lose consciousness and has not had nausea, vomiting, lightheadedness or vision changes.  He describes the pain as constant, similar to 'bumping your head,' and it worsens with yawning or coughing. He has not taken any medication for the pain recently, but notes that rubbing the area provides relief.  No nausea, vomiting, dizziness, lightheadedness, vision changes, or neck and  shoulder pain. He has not experienced headaches like this before and seldom has headaches.   Past Medical History:  Diagnosis Date   Allergy    Depression    Diabetes mellitus without complication (HCC)    GERD (gastroesophageal reflux disease)    Hypertension    Paralysis (HCC)    Seizures (HCC)    Stroke [facility])     Patient Active Problem List   Diagnosis Date Noted   Falls 09/06/2023   Hypercholesterolemia 09/06/2023   Weight loss 07/26/2023   Diabetes mellitus (HCC) 01/21/2023   Hypercholesteremia 01/21/2023   Gait abnormality 10/29/2022   Annual physical exam 09/22/2022   Cerebrovascular disease 09/22/2022   History of DVT (deep vein thrombosis) 09/22/2022   Spastic hemiplegia of left nondominant side as late effect of cerebral infarction (HCC) 09/22/2022   Seizure disorder (HCC) 09/22/2022   BMI 27.0-27.9,adult 09/22/2022   Mild episode of recurrent major depressive disorder 06/15/2022   Status post craniectomy 02/09/2022   Urinary retention 09/18/2021   Vitamin D  deficiency 11/04/2020   Hyperlipidemia 03/28/2018   Diabetes mellitus without complication (HCC) 02/23/2018   Essential hypertension 02/23/2018    Past Surgical History:  Procedure Laterality Date   APPENDECTOMY     BUBBLE STUDY  10/02/2021   Procedure: BUBBLE STUDY;  Surgeon: Okey Vina GAILS, MD;  Location: [facility] ENDOSCOPY;  Service: Cardiovascular;;   CRANIOPLASTY Right 02/09/2022   Procedure: Craniectomy with Replacement of Bone Flap From the Abdomen;  Surgeon: [name], MD;  Location: [facility]  OR;  Service: Neurosurgery;  Laterality: Right;   CRANIOTOMY Right 09/15/2021   Procedure: RIGHT DECOMPRESSIVE CRANIOTOMY, PLACEMENT OF BONE FLAP IN ABDOMEN;  Surgeon: [name], MD;  Location: [facility] OR;  Service: Neurosurgery;  Laterality: Right;   IR CT HEAD LTD  09/12/2021   IR PERCUTANEOUS ART THROMBECTOMY/INFUSION INTRACRANIAL INC DIAG ANGIO  09/12/2021   IR US  GUIDE VASC ACCESS RIGHT  09/12/2021    LAPAROSCOPIC APPENDECTOMY  10/24/2013   Procedure: APPENDECTOMY LAPAROSCOPIC;  Surgeon: [name], MD;  Location: [facility] ORS;  Service: General;;   RADIOLOGY WITH ANESTHESIA N/A 09/12/2021   Procedure: IR WITH ANESTHESIA;  Surgeon: Dolphus Carrion, MD;  Location: [facility] OR;  Service: Radiology;  Laterality: N/A;   TEE WITHOUT CARDIOVERSION N/A 10/02/2021   Procedure: TRANSESOPHAGEAL ECHOCARDIOGRAM (TEE);  Surgeon: Okey Vina GAILS, MD;  Location: [facility] ENDOSCOPY;  Service: Cardiovascular;  Laterality: N/A;       Home Medications    Prior to Admission medications   Medication Sig Start Date End Date Taking? Authorizing Provider  ACCU-CHEK GUIDE TEST test strip USE AS INSTRUCTED 12/06/23   [name], MD  Accu-Chek Softclix Lancets lancets TEST IN THE AM AND BEFORE MEALS 12/06/23   [name], Mayo, MD  aspirin  EC 81 MG tablet Take 1 tablet (81 mg total) by mouth daily. Swallow whole. 02/07/24   [name], MD  atorvastatin  (LIPITOR) 40 MG tablet Take 2 tablets (80 mg total) by mouth at bedtime. 02/07/24   [name], MD  baclofen  (LIORESAL ) 10 MG tablet TAKE 1 TABLET BY MOUTH THREE TIMES A DAY 01/03/24   [name], MD  blood glucose meter kit and supplies KIT Dispense based on patient and insurance preference. Use up to four times daily as directed. 04/09/22   [name], MD  Brivaracetam  (BRIVIACT ) 50 MG TABS TAKE 50 MG BY MOUTH IN THE MORNING AND AT BEDTIME. 02/21/24   Gregg Lek, MD  cloNIDine  (CATAPRES  - DOSED IN MG/24 HR) 0.3 mg/24hr patch Place 1 patch (0.3 mg total) onto the skin once a week. 09/06/23   [name], MD  divalproex  (DEPAKOTE  ER) 500 MG 24 hr tablet Take 1 tablet (500 mg total) by mouth daily. 02/10/24   Whitfield Raisin, NP  escitalopram  (LEXAPRO ) 20 MG tablet TAKE 1 TABLET BY MOUTH EVERY DAY 03/02/24   [name], MD  insulin  glargine (LANTUS ) 100 UNIT/ML injection Inject 0.1 mLs (10 Units total) into the skin daily. 12/22/23   [name], MD  insulin   glargine (LANTUS ) 100 UNIT/ML Solostar Pen Inject 10 Units into the skin daily. 12/24/23   [name], MD  insulin  lispro (HUMALOG  KWIKPEN) 100 UNIT/ML KwikPen INJECT 0-20 UNITS INTO THE SKIN 3 (THREE) TIMES DAILY. INJECT 0-20 UNITS INTO THE SKIN 3 (THREE) TIMES DAILY WITH MEALS. CBG < 70 CALL MD. CBG 70-120 GIVE 0 UNITS 121-150 GIVE 3 UNITS 151-200 GIVE 4 UNITS, 201-250 GIVE 7 UNITS 251-300 GIVE 11 UNITS 301-350 GIVE 15 UNITS 351-400 GIVE 20 UNITS GREATER THAN 400 CALL MD 10/20/22   [name], Mayo, MD  lisinopril  (ZESTRIL ) 40 MG tablet TAKE 1 TABLET BY MOUTH EVERY DAY 03/15/24   [name], Mayo, MD  metFORMIN  (GLUCOPHAGE -XR) 500 MG 24 hr tablet Take 1 tablet (500 mg total) by mouth daily with breakfast. 03/15/24   [name], Mayo, MD  metoprolol  tartrate (LOPRESSOR ) 25 MG tablet TAKE 1 TABLET BY MOUTH TWICE A DAY 05/20/23   Amin, Saad, MD  sildenafil  (  VIAGRA ) 50 MG tablet Take 1 tablet (50 mg total) by mouth daily as needed for erectile dysfunction. 06/15/22   [name], MD  traZODone  (DESYREL ) 50 MG tablet Take 1 tablet (50 mg total) by mouth at bedtime. 02/07/24   [name], MD    Family History Family History  Problem Relation Age of Onset   Asthma Son    Stroke Neg Hx    Colon cancer Neg Hx    Rectal cancer Neg Hx    Stomach cancer Neg Hx    Esophageal cancer Neg Hx     Social History Social History   Tobacco Use   Smoking status: Never   Smokeless tobacco: Never  Vaping Use   Vaping status: Never Used  Substance Use Topics   Alcohol use: Yes    Alcohol/week: 1.0 standard drink of alcohol    Types: 1 Cans of beer per week    Comment: OCCASIONALLY   Drug use: Yes    Types: MDMA (Ecstacy)     Allergies   Hydrochlorothiazide    Review of Systems Review of Systems  Eyes:  Negative for visual disturbance.  Gastrointestinal:  Negative for nausea and vomiting.  Musculoskeletal:  Negative for neck pain and neck stiffness.  Neurological:  Positive for headaches.  Negative for dizziness and light-headedness.     Physical Exam Triage Vital Signs ED Triage Vitals  Encounter Vitals Group     BP 04/04/24 1537 120/76     Girls Systolic BP Percentile --      Girls Diastolic BP Percentile --      Boys Systolic BP Percentile --      Boys Diastolic BP Percentile --      Pulse Rate 04/04/24 1537 69     Resp 04/04/24 1537 17     Temp 04/04/24 1537 99 F (37.2 C)     Temp Source 04/04/24 1537 Oral     SpO2 04/04/24 1537 95 %     Weight --      Height --      Head Circumference --      Peak Flow --      Pain Score 04/04/24 1540 3     Pain Loc --      Pain Education --      Exclude from Growth Chart --    No data found.  Updated Vital Signs BP 120/76 (BP Location: Right Arm)   Pulse 69   Temp 99 F (37.2 C) (Oral)   Resp 17   SpO2 95%   Visual Acuity Right Eye Distance:   Left Eye Distance:   Bilateral Distance:    Right Eye Near:   Left Eye Near:    Bilateral Near:     Physical Exam Vitals reviewed.  Constitutional:      General: He is awake. He is not in acute distress.    Appearance: He is well-developed. He is not ill-appearing, toxic-appearing or diaphoretic.  HENT:     Head: Normocephalic.     Jaw: No trismus.   Eyes:     General: Lids are normal. Gaze aligned appropriately.     Extraocular Movements: Extraocular movements intact.     Conjunctiva/sclera: Conjunctivae normal.     Pupils: Pupils are equal, round, and reactive to light.  Cardiovascular:     Rate and Rhythm: Normal rate and regular rhythm.     Heart sounds: Normal heart sounds. No murmur heard.    No friction rub. No gallop.  Pulmonary:     Effort: Pulmonary effort is normal.     Breath sounds: Normal breath sounds.  Neurological:     Mental Status: He is alert.     Comments: Pt has mild dysarthria and left sided facial asymmetry on exam - appears consistent with hx following infarction   Psychiatric:        Behavior: Behavior is cooperative.       UC Treatments / Results  Labs (all labs ordered are listed, but only abnormal results are displayed) Labs Reviewed - No data to display  EKG   Radiology No results found.  Procedures Procedures (including critical care time)  Medications Ordered in UC Medications - No data to display  Initial Impression / Assessment and Plan / UC Course  I have reviewed the triage vital signs and the nursing notes.  Pertinent labs & imaging results that were available during my care of the patient were reviewed by me and considered in my medical decision making (see chart for details).      Final Clinical Impressions(s) / UC Diagnoses   Final diagnoses:  Primary hypertension  Acute nonintractable headache, unspecified headache type   Hypertension with associated headache Hypertension with recent fluctuations, reaching up to 196/114 mmHg. Associated with a constant, achy headache rated 3/10, located at the occipital region. Headache may be related to elevated blood pressure. Neurological exam is normal. Blood pressure has improved with amlodipine , but headache persists. - Continue amlodipine  5 mg daily. - Monitor blood pressure and adjust amlodipine  to 2.5 mg if dizziness or lightheadedness occurs. - Follow up with primary care for long-term blood pressure management.  Recent falls with head impact Multiple falls over the past three years, with the most recent fall occurring about a week and a half ago. No loss of consciousness, nausea, vomiting, dizziness, or vision changes reported. Pt has concern for potential skull shift due to craniotomy in 2023, but no immediate signs of neurological compromise. No x-ray available in clinic today to assess for these concerns and given chronicity of symptoms and lack of significant trauma I am less suspicious of intracranial issues. - Discuss with primary care provider about ordering imaging to assess for skull shift if concerns persist.  ED and  return precautions reviewed and provided in AVS.  Follow-up as needed.     Discharge Instructions      VISIT SUMMARY:  You came in today because of fluctuating blood pressure and head pain following your craniotomy in 2023. Your blood pressure has been high recently, and you have been experiencing a constant, achy pain in the back of your head. You have also had several falls over the past few years, with the most recent one occurring about a week and a half ago.  YOUR PLAN:  -HYPERTENSION WITH ASSOCIATED HEADACHE: Hypertension means high blood pressure, which can sometimes cause headaches. Your blood pressure has been fluctuating, and you have a constant, achy headache in the back of your head. You should continue taking amlodipine  5 mg daily to manage your blood pressure. If you experience dizziness or lightheadedness, reduce the dose to 2.5 mg. You can continue Tylenol  for pain as well. Please monitor your blood pressure regularly and follow up with your primary care doctor for long-term management.  -RECENT FALLS WITH HEAD IMPACT: You have had multiple falls over the past three years, with the most recent one occurring about a week and a half ago. Although there are no immediate signs of serious issues, there  is a concern about potential complications from your previous craniotomy. You should discuss with your primary care provider about possibly getting imaging done to check for any skull shift or other complications from the falls.  INSTRUCTIONS:  Please follow up with your primary care doctor for long-term blood pressure management and to discuss the possibility of getting imaging done to assess for shifting if this is still a concern.  If you start to have more severe symptoms such as difficulty speaking, facial drooping, weakness on one side of the body, severe headache please go to the emergency room as these could be signs of a medical emergency.      ED Prescriptions   None     PDMP not reviewed this encounter.   Emerita Berkemeier, [place] BRAVO, PA-C 04/04/24 1700

## 2024-04-04 NOTE — Therapy (Signed)
 OUTPATIENT PHYSICAL THERAPY TREATMENT  Patient Name: Jacob [name] MRN: [id number] DOB:[birth year], 48 y.o., male 42 Date: 04/04/2024  PCP: [name], MD REFERRING PROVIDER: [name], Mayo, MD   PT End of Session - 04/04/24 1406     Visit Number 5    Number of Visits 16    Date for Recertification  04/25/24    Authorization Type BCBS    Progress Note Due on Visit 10    PT Start Time 1148    PT Stop Time 1229    PT Time Calculation (min) 41 min    Equipment Utilized During Treatment Gait belt    Activity Tolerance Patient tolerated treatment well    Behavior During Therapy WFL for tasks assessed/performed            Past Medical History:  Diagnosis Date   Allergy    Depression    Diabetes mellitus without complication (HCC)    GERD (gastroesophageal reflux disease)    Hypertension    Paralysis (HCC)    Seizures (HCC)    Stroke [facility])    Past Surgical History:  Procedure Laterality Date   APPENDECTOMY     BUBBLE STUDY  10/02/2021   Procedure: BUBBLE STUDY;  Surgeon: Okey Vina GAILS, MD;  Location: [facility] ENDOSCOPY;  Service: Cardiovascular;;   CRANIOPLASTY Right 02/09/2022   Procedure: Craniectomy with Replacement of Bone Flap From the Abdomen;  Surgeon: [name], MD;  Location: [facility] OR;  Service: Neurosurgery;  Laterality: Right;   CRANIOTOMY Right 09/15/2021   Procedure: RIGHT DECOMPRESSIVE CRANIOTOMY, PLACEMENT OF BONE FLAP IN ABDOMEN;  Surgeon: [name], MD;  Location: [facility] OR;  Service: Neurosurgery;  Laterality: Right;   IR CT HEAD LTD  09/12/2021   IR PERCUTANEOUS ART THROMBECTOMY/INFUSION INTRACRANIAL INC DIAG ANGIO  09/12/2021   IR US  GUIDE VASC ACCESS RIGHT  09/12/2021   LAPAROSCOPIC APPENDECTOMY  10/24/2013   Procedure: APPENDECTOMY LAPAROSCOPIC;  Surgeon: [name], MD;  Location: [facility] ORS;  Service: General;;   RADIOLOGY WITH ANESTHESIA N/A 09/12/2021   Procedure: IR WITH ANESTHESIA;  Surgeon: Dolphus Carrion, MD;  Location: [facility] OR;   Service: Radiology;  Laterality: N/A;   TEE WITHOUT CARDIOVERSION N/A 10/02/2021   Procedure: TRANSESOPHAGEAL ECHOCARDIOGRAM (TEE);  Surgeon: Okey Vina GAILS, MD;  Location: [facility] ENDOSCOPY;  Service: Cardiovascular;  Laterality: N/A;   Patient Active Problem List   Diagnosis Date Noted   Falls 09/06/2023   Hypercholesterolemia 09/06/2023   Weight loss 07/26/2023   Diabetes mellitus (HCC) 01/21/2023   Hypercholesteremia 01/21/2023   Gait abnormality 10/29/2022   Annual physical exam 09/22/2022   Cerebrovascular disease 09/22/2022   History of DVT (deep vein thrombosis) 09/22/2022   Spastic hemiplegia of left nondominant side as late effect of cerebral infarction (HCC) 09/22/2022   Seizure disorder (HCC) 09/22/2022   BMI 27.0-27.9,adult 09/22/2022   Mild episode of recurrent major depressive disorder 06/15/2022   Status post craniectomy 02/09/2022   Urinary retention 09/18/2021   Vitamin D  deficiency 11/04/2020   Hyperlipidemia 03/28/2018   Diabetes mellitus without complication (HCC) 02/23/2018   Essential hypertension 02/23/2018   ONSET DATE: 2023 REFERRING DIAG: Spastic Hemiplegia  THERAPY DIAG:  Muscle weakness (generalized)  Difficulty in walking, not elsewhere classified  Abnormality of gait and mobility  Unsteadiness on feet  Rationale for Evaluation and Treatment: rehab  SUBJECTIVE:  SUBJECTIVE STATEMENT:  Pt reports that that he is doing well. Requested BP check end of session.   Pt accompanied by: wife, son  PERTINENT HISTORY:   48yoM with history of spastic Left hemiplegia s/p craniectomy. Pt previously in rehab at [facility], then at [facility] main for much of 2024. Pt reports functional plateau at home, has remained modI to supervision for much of his ADL. Pt uses a power WC for most  mobility at present, still trying to practice AMB in home with HW and with PCA ~3x months, <161ft typically. Pt sleeps in adjustable bed, reports at least a couple falls related to bed to/from [facility] transfers. Pt reports he has not been able to work on his strength and mobility as he would like since DC, feels like he is able to work harder and accomplish more in a PT setting, is referred back to continue to progress with all mobility and improve his general safety.  PAIN:  Are you having pain? No  PRECAUTIONS: Fall, restricted LUE   WEIGHT BEARING RESTRICTIONS: Yes NWB LUE  FALLS: Has patient fallen in last 6 months? Yes. Number of falls many  LIVING ENVIRONMENT: Lives with: wife, with PCA coverage Lives in: House/apartment Stairs: No Has following equipment at home: Quad cane large base, Hemi walker, Wheelchair (power), and Ramped entry  PATIENT GOALS: Get stronger, safer, more independent with ADL mobility  OBJECTIVE:  Note: Objective measures were completed at evaluation unless otherwise noted.  SENSATION: *Pt has known anesthesia of LLE of note given intermittent use of carbon fiber AFO  LOWER EXTREMITY MMT:    MMT Right Eval Left Eval  Knee flexion    Knee extension    Ankle dorsiflexion  0/5  Ankle plantarflexion  0/5  Ankle inversion  0/5  Ankle eversion  0/5  (Blank rows = not tested)  TRANSFERS: Pull to stand, step pivot car transfers STS push off for step pivot transfers to/from power WC  GAIT:  RUE HW 3-point gait; has not been using his AFO at home, feel like this is only for when doing PT.   FUNCTIONAL TESTS:   -pull to stand, step pivot transfer from [facility] to plinth, car transfer simulation, minGuardA to supervision -STS transfer c RUE HW x1, then static stance x2 minutes half hands free, half with RUE support -STS to HW x5, minGuardA 18.1sec  -seated lateral scoot 1x25ft bilat c AFO donned  -seated RUE reach to floor, then upright sitting x5 -STS from  plinth to [facility], then AMB 4x16ft c HW and 3 turns -STS from plinth to [facility], then AMB 7ft,minGuardA                                                                                                                               TREATMENT DATE 04/04/24:   TA- To improve functional movements patterns for everyday tasks   Arrives in power WC. Improved awareness for navigation of door frames in  chair today.   STS with hand on hemiwalker Donned lite gait harness - LiteGait harness applied with patient in standing position. Straps secured snugly around torso and legs per manufacturer guidelines. Checked for proper alignment, comfort, and safety prior to standing and gait training. All buckles and fasteners verified secure; weight-bearing support adjusted to patient tolerance then had pt holding lite gait handle step up to treadmill surface while PT adjusted height and helped pt process which LE to utilize for the task.   Gait training - activities focussed on specific components of gait cycle and multimodal cueing for completion  On Litegait:   4 rounds of 2 min- first round 1:45 Pace varied from .4-.8 mph with increasing each round.  RPE 15-16 throughout Rest minutes 2 min ea round Pt suspended to reduce some of the weightbearing and load  Total distance 370 ft   Assistance: PT manually assisting LLE propulsion on first several rounds throughout time then as progressed able to complete some of gait for 1 min without assistance. PT also provides verbal cues for R LE stepping to assist patient with keeping up with treadmill belt.   TA- To improve functional movements patterns for everyday tasks   Pt retro steps to get off of treadmill and then once in standing is lowered into seated position by lowering height of light gait.  Then patient stands to further doff harness with upper extremity assist on arms of chair. Doffed lite gait harness- LiteGait harness removed with patient in seated position. Due to  increased abdominal pressure from harness, pt observed for dizziness post removal before transitioning to sitting for recovery.    Self-care  146/95 BP HR 63 after session ducted patient that this is a naturally increase in blood pressure as he increases the load in the task of his muscles.  Patient and wife both verbalized understanding and appreciation of interpretation.  PATIENT EDUCATION: Education details: Should be using AFO at home when mobilizing to improve safety, should also use insole over carbon fiber plate Person educated: Patient and Spouse Education method: collaborative learning, deliberate practice, positive reinforcement, explicit instruction, establish rules. Education comprehension: fair, difficult to convince  HOME EXERCISE PROGRAM: None  GOALS: Goals reviewed with patient? No  SHORT TERM GOALS: Target date: 03/31/24  Pt to demonstrate 5xSTS ad lib fromstandard height in <14sec without LOB  Baseline: 18sec Goal status: INITIAL  2.  Pt to demonstrate ability to perform sustained standing dual tasking with intermittent support without LOB x5 minutes  Baseline: 2 min at eval  Goal status: INITIAL  3.  Pt to demonstrate >155ft  Baseline:  Goal status: INITIAL  LONG TERM GOALS: Target date: 04/30/24  30sec chair rise, technique ad lib, >11x without LOB Baseline: 10/28: 6.5 with heavy UE support on the RUE . Was noted not to let go of arm rest on the R side.  Goal status: INITIAL  2.  > 390ft without LOB with HW  Baseline:10/28 45ft with 1 seated rest break Goal status: INITIAL  3.  Pt to reports consistent use of AFO in house when performing gait training.  Baseline: states that he does not wear it often because he doesn't walk much.  Goal status: INITIAL  4.  Pt to report no falls in most recent 4 weeks.  Baseline: falls often, sometimes with bed transfers and without footwear. Goal status: INITIAL ASSESSMENT:  CLINICAL  IMPRESSION:  Patient dissipated in light gait treadmill training this date.  Patient able to  significantly increase load of ambulation this date with decreased risk of falls.  Patient had several near falls on light gait but like he was able to catch him where if ambulating over ground patient would have had to have a balance reaction or be closely guarded and fall prevented by therapist.  Will continue to progress patient's distance as well as decrease his assistance in future visits utilizing light gait. Pt will continue to benefit from skilled physical therapy intervention to address impairments, improve QOL, and attain therapy goals.   OBJECTIVE IMPAIRMENTS: Decreased knowledge of condition, decreased use of DME, decreased mobility, difficulty walking, decreased strength, decreased ROM. ACTIVITY LIMITATIONS: Lifting, standing, walking, squatting, transfers, locomotion level PARTICIPATION LIMITATIONS: Cleaning, laundry, interpersonal relationships, driving, yardwork, community activity.  PERSONAL FACTORS: Age, behavior pattern, education, past/current experiences, transportation, profession  are also affecting patient's functional outcome.  REHAB POTENTIAL: Good CLINICAL DECISION MAKING: Medium  EVALUATION COMPLEXITY: Moderate    PLAN:  PT FREQUENCY: 1-2x/week  PT DURATION: 8 weeks  PLANNED INTERVENTIONS: 97110-Therapeutic exercises, 97530- Therapeutic activity, [id number]- Neuromuscular re-education, 97535- Self Care, [id number]- Manual therapy, [phone]- Gait training, [phone]- Orthotic Initial, Patient/Family education, Balance training, and Stair training  PLAN FOR NEXT SESSION: transfers and gait training  Lonni KATHEE Gainer, PT 04/04/2024, 2:07 PM

## 2024-04-06 ENCOUNTER — Ambulatory Visit: Admitting: Physical Therapy

## 2024-04-07 ENCOUNTER — Ambulatory Visit

## 2024-04-07 DIAGNOSIS — M6281 Muscle weakness (generalized): Secondary | ICD-10-CM | POA: Diagnosis not present

## 2024-04-07 DIAGNOSIS — R269 Unspecified abnormalities of gait and mobility: Secondary | ICD-10-CM

## 2024-04-07 DIAGNOSIS — R278 Other lack of coordination: Secondary | ICD-10-CM

## 2024-04-07 DIAGNOSIS — R262 Difficulty in walking, not elsewhere classified: Secondary | ICD-10-CM

## 2024-04-07 DIAGNOSIS — R2681 Unsteadiness on feet: Secondary | ICD-10-CM

## 2024-04-07 NOTE — Therapy (Signed)
 OUTPATIENT PHYSICAL THERAPY TREATMENT  Patient Name: Jacob [name] MRN: [id number] DOB:03/27/1976, 48 y.o., male 42 Date: 04/10/2024  PCP: [name], MD REFERRING PROVIDER: [name], Mayo, MD   PT End of Session - 04/10/24 [phone]     Visit Number 6    Number of Visits 16    Date for Recertification  04/25/24    Authorization Type BCBS    Progress Note Due on Visit 10    PT Start Time 1021    PT Stop Time 1105    PT Time Calculation (min) 44 min    Equipment Utilized During Treatment Gait belt    Activity Tolerance Patient tolerated treatment well    Behavior During Therapy WFL for tasks assessed/performed            Past Medical History:  Diagnosis Date   Allergy    Depression    Diabetes mellitus without complication (HCC)    GERD (gastroesophageal reflux disease)    Hypertension    Paralysis (HCC)    Seizures (HCC)    Stroke [facility])    Past Surgical History:  Procedure Laterality Date   APPENDECTOMY     BUBBLE STUDY  10/02/2021   Procedure: BUBBLE STUDY;  Surgeon: Okey Vina GAILS, MD;  Location: [facility] ENDOSCOPY;  Service: Cardiovascular;;   CRANIOPLASTY Right 02/09/2022   Procedure: Craniectomy with Replacement of Bone Flap From the Abdomen;  Surgeon: [name], MD;  Location: [facility] OR;  Service: Neurosurgery;  Laterality: Right;   CRANIOTOMY Right 09/15/2021   Procedure: RIGHT DECOMPRESSIVE CRANIOTOMY, PLACEMENT OF BONE FLAP IN ABDOMEN;  Surgeon: [name], MD;  Location: [facility] OR;  Service: Neurosurgery;  Laterality: Right;   IR CT HEAD LTD  09/12/2021   IR PERCUTANEOUS ART THROMBECTOMY/INFUSION INTRACRANIAL INC DIAG ANGIO  09/12/2021   IR US  GUIDE VASC ACCESS RIGHT  09/12/2021   LAPAROSCOPIC APPENDECTOMY  10/24/2013   Procedure: APPENDECTOMY LAPAROSCOPIC;  Surgeon: [name], MD;  Location: [facility] ORS;  Service: General;;   RADIOLOGY WITH ANESTHESIA N/A 09/12/2021   Procedure: IR WITH ANESTHESIA;  Surgeon: Dolphus Carrion, MD;  Location: [facility] OR;   Service: Radiology;  Laterality: N/A;   TEE WITHOUT CARDIOVERSION N/A 10/02/2021   Procedure: TRANSESOPHAGEAL ECHOCARDIOGRAM (TEE);  Surgeon: Okey Vina GAILS, MD;  Location: [facility] ENDOSCOPY;  Service: Cardiovascular;  Laterality: N/A;   Patient Active Problem List   Diagnosis Date Noted   Falls 09/06/2023   Hypercholesterolemia 09/06/2023   Weight loss 07/26/2023   Diabetes mellitus (HCC) 01/21/2023   Hypercholesteremia 01/21/2023   Gait abnormality 10/29/2022   Annual physical exam 09/22/2022   Cerebrovascular disease 09/22/2022   History of DVT (deep vein thrombosis) 09/22/2022   Spastic hemiplegia of left nondominant side as late effect of cerebral infarction (HCC) 09/22/2022   Seizure disorder (HCC) 09/22/2022   BMI 27.0-27.9,adult 09/22/2022   Mild episode of recurrent major depressive disorder 06/15/2022   Status post craniectomy 02/09/2022   Urinary retention 09/18/2021   Vitamin D  deficiency 11/04/2020   Hyperlipidemia 03/28/2018   Diabetes mellitus without complication (HCC) 02/23/2018   Essential hypertension 02/23/2018   ONSET DATE: 2023 REFERRING DIAG: Spastic Hemiplegia  THERAPY DIAG:  Muscle weakness (generalized)  Difficulty in walking, not elsewhere classified  Abnormality of gait and mobility  Unsteadiness on feet  Other lack of coordination  Rationale for Evaluation and Treatment: rehab  SUBJECTIVE:  SUBJECTIVE STATEMENT:  Pt reports that he is still dealing with posterior headaches. Went to UC and was checked out but reports still having some issues. States instructed to continue to monitor his BP and continue with BP meds and f/u with PCP.  Pt accompanied by: wife, son  PERTINENT HISTORY:   48yoM with history of spastic Left hemiplegia s/p craniectomy. Pt previously in  rehab at [facility], then at [facility] main for much of 2024. Pt reports functional plateau at home, has remained modI to supervision for much of his ADL. Pt uses a power WC for most mobility at present, still trying to practice AMB in home with HW and with PCA ~3x months, <137ft typically. Pt sleeps in adjustable bed, reports at least a couple falls related to bed to/from [facility] transfers. Pt reports he has not been able to work on his strength and mobility as he would like since DC, feels like he is able to work harder and accomplish more in a PT setting, is referred back to continue to progress with all mobility and improve his general safety.  PAIN:  Are you having pain? No  PRECAUTIONS: Fall, restricted LUE   WEIGHT BEARING RESTRICTIONS: Yes NWB LUE  FALLS: Has patient fallen in last 6 months? Yes. Number of falls many  LIVING ENVIRONMENT: Lives with: wife, with PCA coverage Lives in: House/apartment Stairs: No Has following equipment at home: Quad cane large base, Hemi walker, Wheelchair (power), and Ramped entry  PATIENT GOALS: Get stronger, safer, more independent with ADL mobility  OBJECTIVE:  Note: Objective measures were completed at evaluation unless otherwise noted.  SENSATION: *Pt has known anesthesia of LLE of note given intermittent use of carbon fiber AFO  LOWER EXTREMITY MMT:    MMT Right Eval Left Eval  Knee flexion    Knee extension    Ankle dorsiflexion  0/5  Ankle plantarflexion  0/5  Ankle inversion  0/5  Ankle eversion  0/5  (Blank rows = not tested)  TRANSFERS: Pull to stand, step pivot car transfers STS push off for step pivot transfers to/from power WC  GAIT:  RUE HW 3-point gait; has not been using his AFO at home, feel like this is only for when doing PT.   FUNCTIONAL TESTS:   -pull to stand, step pivot transfer from [facility] to plinth, car transfer simulation, minGuardA to supervision -STS transfer c RUE HW x1, then static stance x2 minutes half hands free,  half with RUE support -STS to HW x5, minGuardA 18.1sec  -seated lateral scoot 1x46ft bilat c AFO donned  -seated RUE reach to floor, then upright sitting x5 -STS from plinth to [facility] then AMB 4x26ft c HW and 3 turns -STS from plinth to [facility], then AMB 71ft,minGuardA                                                                                                                               TREATMENT DATE 04/07/24:  TA- To improve functional movements patterns for everyday tasks   Arrives in power WC. Improved awareness for navigation of door frames in chair today.   Sit to stand instruction with Right hand on armrest and positioned left foot to assist more with standing x 5 at support bar- Min VC for hand and foot placement.   Static standing -focusing on equal weight bearing- VC and tactile cues to have patient  shift back toward affected left side and PT blocked knee minimally in case of any buckling. Patient utilized 1 hand support.   Dynamic standing activities- arranging magnet colors by colors into groups involving weight shifting L-R; dynamic reaching and hand/eye coordination- forcing patient to look toward affected/neglected left side   Static standing without UE support focusing on applying more weight through Left LE x 2 min  Step tap onto appropriate -called out color of 1/2 spike ball (blue, green, pink, and red) with RLE to emphasize weight through LLE and close knee block on Left x several min  Stand along side of support bar and holding on with RUE- walking forward/backward at support bar- approx 5-6 steps forward and around 10 bwd x several trials - attempting reciprocal gait.    Self-care  126/82 BP HR 67 after session   PATIENT EDUCATION: Education details: Should be using AFO at home when mobilizing to improve safety, should also use insole over carbon fiber plate Person educated: Patient and Spouse Education method: collaborative learning, deliberate practice, positive  reinforcement, explicit instruction, establish rules. Education comprehension: fair, difficult to convince  HOME EXERCISE PROGRAM: None  GOALS: Goals reviewed with patient? No  SHORT TERM GOALS: Target date: 03/31/24  Pt to demonstrate 5xSTS ad lib fromstandard height in <14sec without LOB  Baseline: 18sec Goal status: INITIAL  2.  Pt to demonstrate ability to perform sustained standing dual tasking with intermittent support without LOB x5 minutes  Baseline: 2 min at eval  Goal status: INITIAL  3.  Pt to demonstrate >111ft  Baseline:  Goal status: INITIAL  LONG TERM GOALS: Target date: 04/30/24  30sec chair rise, technique ad lib, >11x without LOB Baseline: 10/28: 6.5 with heavy UE support on the RUE . Was noted not to let go of arm rest on the R side. 04/07/2024=  Goal status: INITIAL  2.  > 371ft without LOB with HW  Baseline:10/28 59ft with 1 seated rest break Goal status: INITIAL  3.  Pt to reports consistent use of AFO in house when performing gait training.  Baseline: states that he does not wear it often because he doesn't walk much.  Goal status: INITIAL  4.  Pt to report no falls in most recent 4 weeks.  Baseline: falls often, sometimes with bed transfers and without footwear. Goal status: INITIAL ASSESSMENT:  CLINICAL IMPRESSION:  Patient performed well overall today- able to respond to VC for increased LLE weight bearing with some knee buckling yet improved in session. He was able to dual task today focusing on activity and able to emphasize more weight shifting over toward affected left side. He was fatigued at end of session and still complaining of mild headache yet no worse throughout session today. He will continue to benefit from dynamic activities and lite gait for improved overall mobility. Pt will continue to benefit from skilled physical therapy intervention to address impairments, improve QOL, and attain therapy goals.   OBJECTIVE  IMPAIRMENTS: Decreased knowledge of condition, decreased use of DME, decreased mobility, difficulty walking, decreased strength,  decreased ROM. ACTIVITY LIMITATIONS: Lifting, standing, walking, squatting, transfers, locomotion level PARTICIPATION LIMITATIONS: Cleaning, laundry, interpersonal relationships, driving, yardwork, community activity.  PERSONAL FACTORS: Age, behavior pattern, education, past/current experiences, transportation, profession  are also affecting patient's functional outcome.  REHAB POTENTIAL: Good CLINICAL DECISION MAKING: Medium  EVALUATION COMPLEXITY: Moderate    PLAN:  PT FREQUENCY: 1-2x/week  PT DURATION: 8 weeks  PLANNED INTERVENTIONS: 97110-Therapeutic exercises, 97530- Therapeutic activity, [id number]- Neuromuscular re-education, 97535- Self Care, 02859- Manual therapy, [phone]- Gait training, [phone]- Orthotic Initial, Patient/Family education, Balance training, and Stair training  PLAN FOR NEXT SESSION: transfers and gait training (life gait as appropriate)   Reyes LOISE London, PT 04/10/2024, 7:22 AM

## 2024-04-11 ENCOUNTER — Ambulatory Visit

## 2024-04-11 DIAGNOSIS — M6281 Muscle weakness (generalized): Secondary | ICD-10-CM

## 2024-04-11 DIAGNOSIS — R269 Unspecified abnormalities of gait and mobility: Secondary | ICD-10-CM

## 2024-04-11 DIAGNOSIS — R2681 Unsteadiness on feet: Secondary | ICD-10-CM

## 2024-04-11 DIAGNOSIS — R262 Difficulty in walking, not elsewhere classified: Secondary | ICD-10-CM

## 2024-04-11 DIAGNOSIS — R278 Other lack of coordination: Secondary | ICD-10-CM

## 2024-04-11 NOTE — Therapy (Signed)
 OUTPATIENT PHYSICAL THERAPY TREATMENT  Patient Name: Jacob [name] MRN: [id number] DOB:[birth year], 48 y.o., male 14 Date: 04/12/2024  PCP: [name], MD REFERRING PROVIDER: [name], Mayo, MD   PT End of Session - 04/11/24 1103     Visit Number 7    Number of Visits 16    Date for Recertification  04/25/24    Authorization Type [employer]    Authorization Time Period 11/17-12/31    Authorization - Visit Number 1    Authorization - Number of Visits 8    Progress Note Due on Visit 10    PT Start Time 1104    PT Stop Time 1145    PT Time Calculation (min) 41 min    Equipment Utilized During Treatment Gait belt    Activity Tolerance Patient tolerated treatment well    Behavior During Therapy WFL for tasks assessed/performed            Past Medical History:  Diagnosis Date   Allergy    Depression    Diabetes mellitus without complication (HCC)    GERD (gastroesophageal reflux disease)    Hypertension    Paralysis (HCC)    Seizures (HCC)    Stroke [facility])    Past Surgical History:  Procedure Laterality Date   APPENDECTOMY     BUBBLE STUDY  10/02/2021   Procedure: BUBBLE STUDY;  Surgeon: Okey Vina GAILS, MD;  Location: [facility] ENDOSCOPY;  Service: Cardiovascular;;   CRANIOPLASTY Right 02/09/2022   Procedure: Craniectomy with Replacement of Bone Flap From the Abdomen;  Surgeon: [name], MD;  Location[facility] OR;  Service: Neurosurgery;  Laterality: Right;   CRANIOTOMY Right 09/15/2021   Procedure: RIGHT DECOMPRESSIVE CRANIOTOMY, PLACEMENT OF BONE FLAP IN ABDOMEN;  Surgeon: [name], MD;  Location: [facility] OR;  Service: Neurosurgery;  Laterality: Right;   IR CT HEAD LTD  09/12/2021   IR PERCUTANEOUS ART THROMBECTOMY/INFUSION INTRACRANIAL INC DIAG ANGIO  09/12/2021   IR US  GUIDE VASC ACCESS RIGHT  09/12/2021   LAPAROSCOPIC APPENDECTOMY  10/24/2013   Procedure: APPENDECTOMY LAPAROSCOPIC;  Surgeon: [name], MD;  Location: [facility] ORS;  Service: General;;   RADIOLOGY WITH  ANESTHESIA N/A 09/12/2021   Procedure: IR WITH ANESTHESIA;  Surgeon: Dolphus Carrion, MD;  Location: [facility] OR;  Service: Radiology;  Laterality: N/A;   TEE WITHOUT CARDIOVERSION N/A 10/02/2021   Procedure: TRANSESOPHAGEAL ECHOCARDIOGRAM (TEE);  Surgeon: Okey Vina GAILS, MD;  Location: [facility] ENDOSCOPY;  Service: Cardiovascular;  Laterality: N/A;   Patient Active Problem List   Diagnosis Date Noted   Falls 09/06/2023   Hypercholesterolemia 09/06/2023   Weight loss 07/26/2023   Diabetes mellitus (HCC) 01/21/2023   Hypercholesteremia 01/21/2023   Gait abnormality 10/29/2022   Annual physical exam 09/22/2022   Cerebrovascular disease 09/22/2022   History of DVT (deep vein thrombosis) 09/22/2022   Spastic hemiplegia of left nondominant side as late effect of cerebral infarction (HCC) 09/22/2022   Seizure disorder (HCC) 09/22/2022   BMI 27.0-27.9,adult 09/22/2022   Mild episode of recurrent major depressive disorder 06/15/2022   Status post craniectomy 02/09/2022   Urinary retention 09/18/2021   Vitamin D  deficiency 11/04/2020   Hyperlipidemia 03/28/2018   Diabetes mellitus without complication (HCC) 02/23/2018   Essential hypertension 02/23/2018   ONSET DATE: 2023 REFERRING DIAG: Spastic Hemiplegia  THERAPY DIAG:  Muscle weakness (generalized)  Difficulty in walking, not elsewhere classified  Abnormality of gait and mobility  Unsteadiness on feet  Other lack of coordination  Rationale for Evaluation and Treatment: rehab  SUBJECTIVE:                                                                                                                                                                                             SUBJECTIVE STATEMENT:  Pt reports headaches are better and happy that the insurance approved him for some more visits.  Pt accompanied by: wife, son  PERTINENT HISTORY:   48yoM with history of spastic Left hemiplegia s/p craniectomy. Pt previously in rehab at [facility],  then at [facility] main for much of 2024. Pt reports functional plateau at home, has remained modI to supervision for much of his ADL. Pt uses a power WC for most mobility at present, still trying to practice AMB in home with HW and with PCA ~3x months, <160ft typically. Pt sleeps in adjustable bed, reports at least a couple falls related to bed to/from [facility] transfers. Pt reports he has not been able to work on his strength and mobility as he would like since DC, feels like he is able to work harder and accomplish more in a PT setting, is referred back to continue to progress with all mobility and improve his general safety.  PAIN:  Are you having pain? No  PRECAUTIONS: Fall, restricted LUE   WEIGHT BEARING RESTRICTIONS: Yes NWB LUE  FALLS: Has patient fallen in last 6 months? Yes. Number of falls many  LIVING ENVIRONMENT: Lives with: wife, with PCA coverage Lives in: House/apartment Stairs: No Has following equipment at home: Quad cane large base, Hemi walker, Wheelchair (power), and Ramped entry  PATIENT GOALS: Get stronger, safer, more independent with ADL mobility  OBJECTIVE:  Note: Objective measures were completed at evaluation unless otherwise noted.  SENSATION: *Pt has known anesthesia of LLE of note given intermittent use of carbon fiber AFO  LOWER EXTREMITY MMT:    MMT Right Eval Left Eval  Knee flexion    Knee extension    Ankle dorsiflexion  0/5  Ankle plantarflexion  0/5  Ankle inversion  0/5  Ankle eversion  0/5  (Blank rows = not tested)  TRANSFERS: Pull to stand, step pivot car transfers STS push off for step pivot transfers to/from power WC  GAIT:  RUE HW 3-point gait; has not been using his AFO at home, feel like this is only for when doing PT.   FUNCTIONAL TESTS:   -pull to stand, step pivot transfer from [facility] to plinth, car transfer simulation, minGuardA to supervision -STS transfer c RUE HW x1, then static stance x2 minutes half hands free, half with RUE  support -STS to HW x5, minGuardA 18.1sec  -seated lateral scoot  1x40ft bilat c AFO donned  -seated RUE reach to floor, then upright sitting x5 -STS from plinth to [facility], then AMB 4x44ft c HW and 3 turns -STS from plinth to [facility], then AMB 53ft,minGuardA                                                                                                                               TREATMENT DATE 04/11/24:    TA- To improve functional movements patterns for everyday tasks   Arrives in power WC. Improved awareness for navigation of door frames in chair today. *increased time spent by PT donning left AFO today.   Sit to stand instruction with Right hand on armrest and positioned left foot to assist more with standing x multiple trials at support bar- Min VC for hand and foot placement. *Reminders not to reach for bar and pull up.   Static standing -focusing on equal weight bearing- VC and tactile cues for weight shifting toward affected side- no blocking of Left knee knee minimally in case of any buckling. Patient utilized 1 hand support.   Dynamic standing activities- standing without UE support and horizontal head turns at Support bar x 10. Then patient performed dynamic activity- moving magnet letter into colored groups that challenged him with prolonged standing- > 8 min and forcing him to look over toward neglect left side - Promote left side attention and weight shifting L; dynamic reaching and hand/eye coordination   Gait training:  Patient ambulated approx 60 feet today using hemiwalker, CGA with use of gait belt- exhibiting step to gait with some difficulty with coordinating gait sequencing- occasional cues to stop, gather, and continue- Limited by fatigue at end of session.   PATIENT EDUCATION: Education details: Should be using AFO at home when mobilizing to improve safety, should also use insole over carbon fiber plate Person educated: Patient and Spouse Education method: collaborative  learning, deliberate practice, positive reinforcement, explicit instruction, establish rules. Education comprehension: fair, difficult to convince  HOME EXERCISE PROGRAM: None  GOALS: Goals reviewed with patient? No  SHORT TERM GOALS: Target date: 03/31/24  Pt to demonstrate 5xSTS ad lib fromstandard height in <14sec without LOB  Baseline: 18sec Goal status: INITIAL  2.  Pt to demonstrate ability to perform sustained standing dual tasking with intermittent support without LOB x5 minutes  Baseline: 2 min at eval  Goal status: INITIAL  3.  Pt to demonstrate >118ft  Baseline:  Goal status: INITIAL  LONG TERM GOALS: Target date: 04/30/24  30sec chair rise, technique ad lib, >11x without LOB Baseline: 10/28: 6.5 with heavy UE support on the RUE . Was noted not to let go of arm rest on the R side. 04/07/2024=  Goal status: INITIAL  2.  > 3102ft without LOB with HW  Baseline:10/28 32ft with 1 seated rest break Goal status: INITIAL  3.  Pt to reports consistent use of AFO in house when performing gait training.  Baseline: states that he does not wear it often because he doesn't walk much.  Goal status: INITIAL  4.  Pt to report no falls in most recent 4 weeks.  Baseline: falls often, sometimes with bed transfers and without footwear. Goal status: INITIAL ASSESSMENT:  CLINICAL IMPRESSION:  Treatment focused again on standing activities and gait today. He was able to demonstrate some improvement with weight bearing with less blocking of knee today. Better attention to left side and ability to weight shift without as many verbal cues. He was limited by fatigue at end of session while walking. Will try to go back to lite gait activities in next session or two to focus on more pace and more reciprocal stepping. Pt will continue to benefit from skilled physical therapy intervention to address impairments, improve QOL, and attain therapy goals.   OBJECTIVE IMPAIRMENTS: Decreased  knowledge of condition, decreased use of DME, decreased mobility, difficulty walking, decreased strength, decreased ROM. ACTIVITY LIMITATIONS: Lifting, standing, walking, squatting, transfers, locomotion level PARTICIPATION LIMITATIONS: Cleaning, laundry, interpersonal relationships, driving, yardwork, community activity.  PERSONAL FACTORS: Age, behavior pattern, education, past/current experiences, transportation, profession  are also affecting patient's functional outcome.  REHAB POTENTIAL: Good CLINICAL DECISION MAKING: Medium  EVALUATION COMPLEXITY: Moderate    PLAN:  PT FREQUENCY: 1-2x/week  PT DURATION: 8 weeks  PLANNED INTERVENTIONS: 97110-Therapeutic exercises, 97530- Therapeutic activity, 97112- Neuromuscular re-education, 97535- Self Care, 02859- Manual therapy, [phone]- Gait training, [phone]- Orthotic Initial, Patient/Family education, Balance training, and Stair training  PLAN FOR NEXT SESSION: transfers and gait training (life gait as appropriate)   Reyes LOISE London, PT 04/12/2024, 7:15 AM

## 2024-04-12 ENCOUNTER — Ambulatory Visit

## 2024-04-13 ENCOUNTER — Ambulatory Visit: Admitting: Physical Therapy

## 2024-04-18 ENCOUNTER — Ambulatory Visit

## 2024-04-18 ENCOUNTER — Ambulatory Visit: Admitting: Physical Therapy

## 2024-04-18 DIAGNOSIS — R278 Other lack of coordination: Secondary | ICD-10-CM

## 2024-04-18 DIAGNOSIS — R262 Difficulty in walking, not elsewhere classified: Secondary | ICD-10-CM

## 2024-04-18 DIAGNOSIS — R2681 Unsteadiness on feet: Secondary | ICD-10-CM

## 2024-04-18 DIAGNOSIS — M6281 Muscle weakness (generalized): Secondary | ICD-10-CM

## 2024-04-18 DIAGNOSIS — R269 Unspecified abnormalities of gait and mobility: Secondary | ICD-10-CM

## 2024-04-18 NOTE — Therapy (Signed)
 OUTPATIENT PHYSICAL THERAPY TREATMENT  Patient Name: Jacob [name] MRN: [id number] DOB:1975/07/07, 48 y.o., male 20 Date: 04/18/2024  PCP: [name], MD REFERRING PROVIDER: [name], Mayo, MD   PT End of Session - 04/18/24 1326     Visit Number 8    Number of Visits 16    Date for Recertification  04/25/24    Authorization Type [employer]    Authorization Time Period 11/17-12/31    Authorization - Number of Visits 8    Progress Note Due on Visit 10    PT Start Time 1145    PT Stop Time 1230    PT Time Calculation (min) 45 min    Equipment Utilized During Treatment Gait belt    Activity Tolerance Patient tolerated treatment well    Behavior During Therapy [facility] for tasks assessed/performed            Past Medical History:  Diagnosis Date   Allergy    Depression    Diabetes mellitus without complication (HCC)    GERD (gastroesophageal reflux disease)    Hypertension    Paralysis (HCC)    Seizures (HCC)    Stroke [facility])    Past Surgical History:  Procedure Laterality Date   APPENDECTOMY     BUBBLE STUDY  10/02/2021   Procedure: BUBBLE STUDY;  Surgeon: Okey Vina GAILS, MD;  Location: [facility] ENDOSCOPY;  Service: Cardiovascular;;   CRANIOPLASTY Right 02/09/2022   Procedure: Craniectomy with Replacement of Bone Flap From the Abdomen;  Surgeon: [name], MD;  Location: [facility] OR;  Service: Neurosurgery;  Laterality: Right;   CRANIOTOMY Right 09/15/2021   Procedure: RIGHT DECOMPRESSIVE CRANIOTOMY, PLACEMENT OF BONE FLAP IN ABDOMEN;  Surgeon: [name], MD;  Location: [facility] OR;  Service: Neurosurgery;  Laterality: Right;   IR CT HEAD LTD  09/12/2021   IR PERCUTANEOUS ART THROMBECTOMY/INFUSION INTRACRANIAL INC DIAG ANGIO  09/12/2021   IR US  GUIDE VASC ACCESS RIGHT  09/12/2021   LAPAROSCOPIC APPENDECTOMY  10/24/2013   Procedure: APPENDECTOMY LAPAROSCOPIC;  Surgeon: [name], MD;  Location: [facility] ORS;  Service: General;;   RADIOLOGY WITH ANESTHESIA N/A 09/12/2021    Procedure: IR WITH ANESTHESIA;  Surgeon: Dolphus Carrion, MD;  Location: [facility] OR;  Service: Radiology;  Laterality: N/A;   TEE WITHOUT CARDIOVERSION N/A 10/02/2021   Procedure: TRANSESOPHAGEAL ECHOCARDIOGRAM (TEE);  Surgeon: Okey Vina GAILS, MD;  Location: [facility] ENDOSCOPY;  Service: Cardiovascular;  Laterality: N/A;   Patient Active Problem List   Diagnosis Date Noted   Falls 09/06/2023   Hypercholesterolemia 09/06/2023   Weight loss 07/26/2023   Diabetes mellitus (HCC) 01/21/2023   Hypercholesteremia 01/21/2023   Gait abnormality 10/29/2022   Annual physical exam 09/22/2022   Cerebrovascular disease 09/22/2022   History of DVT (deep vein thrombosis) 09/22/2022   Spastic hemiplegia of left nondominant side as late effect of cerebral infarction (HCC) 09/22/2022   Seizure disorder (HCC) 09/22/2022   BMI 27.0-27.9,adult 09/22/2022   Mild episode of recurrent major depressive disorder 06/15/2022   Status post craniectomy 02/09/2022   Urinary retention 09/18/2021   Vitamin D  deficiency 11/04/2020   Hyperlipidemia 03/28/2018   Diabetes mellitus without complication (HCC) 02/23/2018   Essential hypertension 02/23/2018   ONSET DATE: 2023 REFERRING DIAG: Spastic Hemiplegia  THERAPY DIAG:  Muscle weakness (generalized)  Difficulty in walking, not elsewhere classified  Abnormality of gait and mobility  Unsteadiness on feet  Other lack of coordination  Rationale for Evaluation and Treatment: rehab  SUBJECTIVE:  SUBJECTIVE STATEMENT:  Pt reports headaches are better and happy that the insurance approved him for some more visits.  Pt accompanied by: wife, son  PERTINENT HISTORY:   48yoM with history of spastic Left hemiplegia s/p craniectomy. Pt previously in rehab at [facility], then at [facility] main for much of  2024. Pt reports functional plateau at home, has remained modI to supervision for much of his ADL. Pt uses a power WC for most mobility at present, still trying to practice AMB in home with HW and with PCA ~3x months, <171ft typically. Pt sleeps in adjustable bed, reports at least a couple falls related to bed to/from [facility] transfers. Pt reports he has not been able to work on his strength and mobility as he would like since DC, feels like he is able to work harder and accomplish more in a PT setting, is referred back to continue to progress with all mobility and improve his general safety.  PAIN:  Are you having pain? No  PRECAUTIONS: Fall, restricted LUE   WEIGHT BEARING RESTRICTIONS: Yes NWB LUE  FALLS: Has patient fallen in last 6 months? Yes. Number of falls many  LIVING ENVIRONMENT: Lives with: wife, with PCA coverage Lives in: House/apartment Stairs: No Has following equipment at home: Quad cane large base, Hemi walker, Wheelchair (power), and Ramped entry  PATIENT GOALS: Get stronger, safer, more independent with ADL mobility  OBJECTIVE:  Note: Objective measures were completed at evaluation unless otherwise noted.  SENSATION: *Pt has known anesthesia of LLE of note given intermittent use of carbon fiber AFO  LOWER EXTREMITY MMT:    MMT Right Eval Left Eval  Knee flexion    Knee extension    Ankle dorsiflexion  0/5  Ankle plantarflexion  0/5  Ankle inversion  0/5  Ankle eversion  0/5  (Blank rows = not tested)  TRANSFERS: Pull to stand, step pivot car transfers STS push off for step pivot transfers to/from power WC  GAIT:  RUE HW 3-point gait; has not been using his AFO at home, feel like this is only for when doing PT.   FUNCTIONAL TESTS:   -pull to stand, step pivot transfer from [facility] to plinth, car transfer simulation, minGuardA to supervision -STS transfer c RUE HW x1, then static stance x2 minutes half hands free, half with RUE support -STS to HW x5, minGuardA  18.1sec  -seated lateral scoot 1x82ft bilat c AFO donned  -seated RUE reach to floor, then upright sitting x5 -STS from plinth to [facility], then AMB 4x104ft c HW and 3 turns -STS from plinth to [facility], then AMB 77ft,minGuardA                                                                                                                               TREATMENT DATE 04/11/24:    TA- To improve functional movements patterns for everyday tasks   Arrives in power WC. Total A from PT to don  AFO on the LLE.   Sit<>stand with HW for PT to don Litegait harness. Heavy UE support from pt on HW. Instruction to maintain neutral WB through BLE as tolerated.   Ambulatory transfer to lite gait treadmill in harness.  Step up on lite gait with RLE leading.   BWSTT:  X 2 min 0.7 MPH 126 ft.  Rest break in harness  X 2 min 0.23mph total 452ft.  Pt requesting rest break in harness then sitting in arm chair when he expresses the urge to urinate.  For both bouts of gait training pt initially required only tactile cues for step length, but quickly faded to mod-max assist for stance on the LLE as well as increased step length on the LLE. Mod-max cues for decreased step length on the RLE to allow improved reciprocal pattern and force improved WB in the LLE.   Harness in place with reverse gait to edge of harness and to step down to floor leading with the LLE; poor WB noted in the LLE with reverse step to   Total A to doff litegait then return to sitting in [facility] with CGA for safety.  Pt transported to bathroom in power [facility] with family member to urination.   Returned to [employer] after 5 min in bathroom.  Gait training with HW and AFO x 68ft with min-mod assist due to anterior Lateral L Lean. Was noted to have increased L lateral bias with fatigue and difficulty with attention to the  LLE and postural awareness.   Power WC positioned under pt. CGA for stand to sit in personal chair.   Total A for PT to doff AFO. Left in [facility]  with family member present.   PATIENT EDUCATION: Education details: Should be using AFO at home when mobilizing to improve safety, should also use insole over carbon fiber plate Person educated: Patient and Spouse Education method: collaborative learning, deliberate practice, positive reinforcement, explicit instruction, establish rules. Education comprehension: fair, difficult to convince  HOME EXERCISE PROGRAM: None  GOALS: Goals reviewed with patient? No  SHORT TERM GOALS: Target date: 03/31/24  Pt to demonstrate 5xSTS ad lib fromstandard height in <14sec without LOB  Baseline: 18sec Goal status: INITIAL  2.  Pt to demonstrate ability to perform sustained standing dual tasking with intermittent support without LOB x5 minutes  Baseline: 2 min at eval  Goal status: INITIAL  3.  Pt to demonstrate >168ft  Baseline:  Goal status: INITIAL  LONG TERM GOALS: Target date: 04/30/24  30sec chair rise, technique ad lib, >11x without LOB Baseline: 10/28: 6.5 with heavy UE support on the RUE . Was noted not to let go of arm rest on the R side. 04/07/2024=  Goal status: INITIAL  2.  > 365ft without LOB with HW  Baseline:10/28 4ft with 1 seated rest break Goal status: INITIAL  3.  Pt to reports consistent use of AFO in house when performing gait training.  Baseline: states that he does not wear it often because he doesn't walk much.  Goal status: INITIAL  4.  Pt to report no falls in most recent 4 weeks.  Baseline: falls often, sometimes with bed transfers and without footwear. Goal status: INITIAL ASSESSMENT:  CLINICAL IMPRESSION:  Treatment focused on standing and gait training with BWSTT in lite gait as well as over ground gait training. Initially able to advance the LLE with CGA, but increased tone and decreased attention required increased assist throughout each bout of gait training. Session  slightly limited by need for urination. Family assisted with toilet  transfer in bathroom.  Mod assist required for gait due to L lateral and anterior LOB with fatigue.  Pt will continue to benefit from skilled physical therapy intervention to address impairments, improve QOL, and attain therapy goals.   OBJECTIVE IMPAIRMENTS: Decreased knowledge of condition, decreased use of DME, decreased mobility, difficulty walking, decreased strength, decreased ROM. ACTIVITY LIMITATIONS: Lifting, standing, walking, squatting, transfers, locomotion level PARTICIPATION LIMITATIONS: Cleaning, laundry, interpersonal relationships, driving, yardwork, community activity.  PERSONAL FACTORS: Age, behavior pattern, education, past/current experiences, transportation, profession  are also affecting patient's functional outcome.  REHAB POTENTIAL: Good CLINICAL DECISION MAKING: Medium  EVALUATION COMPLEXITY: Moderate    PLAN:  PT FREQUENCY: 1-2x/week  PT DURATION: 8 weeks  PLANNED INTERVENTIONS: 97110-Therapeutic exercises, 97530- Therapeutic activity, [id number]- Neuromuscular re-education, 97535- Self Care, 02859- Manual therapy, [phone]- Gait training, [phone]- Orthotic Initial, Patient/Family education, Balance training, and Stair training  PLAN FOR NEXT SESSION:    transfers and gait training (life gait as appropriate)   Massie FORBES Dollar, PT 04/18/2024, 1:27 PM

## 2024-04-25 ENCOUNTER — Ambulatory Visit: Admitting: Physical Therapy

## 2024-04-25 ENCOUNTER — Ambulatory Visit

## 2024-04-27 ENCOUNTER — Ambulatory Visit: Attending: Internal Medicine

## 2024-04-27 ENCOUNTER — Ambulatory Visit

## 2024-04-27 DIAGNOSIS — R2681 Unsteadiness on feet: Secondary | ICD-10-CM | POA: Insufficient documentation

## 2024-04-27 DIAGNOSIS — R262 Difficulty in walking, not elsewhere classified: Secondary | ICD-10-CM | POA: Diagnosis present

## 2024-04-27 DIAGNOSIS — R278 Other lack of coordination: Secondary | ICD-10-CM | POA: Insufficient documentation

## 2024-04-27 DIAGNOSIS — M6281 Muscle weakness (generalized): Secondary | ICD-10-CM | POA: Diagnosis present

## 2024-04-27 DIAGNOSIS — R269 Unspecified abnormalities of gait and mobility: Secondary | ICD-10-CM | POA: Diagnosis present

## 2024-04-27 NOTE — Therapy (Addendum)
 OUTPATIENT PHYSICAL THERAPY TREATMENT  Patient Name: Jacob [name] MRN: [id number] DOB:12-04-1975, 48 y.o., male 5 Date: 04/27/2024  PCP: [name], MD REFERRING PROVIDER: [name], Mayo, MD   PT End of Session - 04/27/24 1100     Visit Number 9    Number of Visits 16    Date for Recertification  04/25/24    Authorization Type [employer]    Authorization Time Period 11/17-12/31    Authorization - Number of Visits 8    Progress Note Due on Visit 10    PT Start Time 1100    PT Stop Time 1142    PT Time Calculation (min) 42 min    Equipment Utilized During Treatment Gait belt    Activity Tolerance Patient tolerated treatment well    Behavior During Therapy WFL for tasks assessed/performed            Past Medical History:  Diagnosis Date   Allergy    Depression    Diabetes mellitus without complication (HCC)    GERD (gastroesophageal reflux disease)    Hypertension    Paralysis (HCC)    Seizures (HCC)    Stroke [facility])    Past Surgical History:  Procedure Laterality Date   APPENDECTOMY     BUBBLE STUDY  10/02/2021   Procedure: BUBBLE STUDY;  Surgeon: Okey Vina GAILS, MD;  Location: [facility] ENDOSCOPY;  Service: Cardiovascular;;   CRANIOPLASTY Right 02/09/2022   Procedure: Craniectomy with Replacement of Bone Flap From the Abdomen;  Surgeon: [name], MD;  Location: [facility] OR;  Service: Neurosurgery;  Laterality: Right;   CRANIOTOMY Right 09/15/2021   Procedure: RIGHT DECOMPRESSIVE CRANIOTOMY, PLACEMENT OF BONE FLAP IN ABDOMEN;  Surgeon: [name], MD;  Location: [facility] OR;  Service: Neurosurgery;  Laterality: Right;   IR CT HEAD LTD  09/12/2021   IR PERCUTANEOUS ART THROMBECTOMY/INFUSION INTRACRANIAL INC DIAG ANGIO  09/12/2021   IR US  GUIDE VASC ACCESS RIGHT  09/12/2021   LAPAROSCOPIC APPENDECTOMY  10/24/2013   Procedure: APPENDECTOMY LAPAROSCOPIC;  Surgeon: [name], MD;  Location: [facility] ORS;  Service: General;;   RADIOLOGY WITH ANESTHESIA N/A 09/12/2021    Procedure: IR WITH ANESTHESIA;  Surgeon: Dolphus Carrion, MD;  Location: [facility] OR;  Service: Radiology;  Laterality: N/A;   TEE WITHOUT CARDIOVERSION N/A 10/02/2021   Procedure: TRANSESOPHAGEAL ECHOCARDIOGRAM (TEE);  Surgeon: Okey Vina GAILS, MD;  Location: [facility] ENDOSCOPY;  Service: Cardiovascular;  Laterality: N/A;   Patient Active Problem List   Diagnosis Date Noted   Falls 09/06/2023   Hypercholesterolemia 09/06/2023   Weight loss 07/26/2023   Diabetes mellitus (HCC) 01/21/2023   Hypercholesteremia 01/21/2023   Gait abnormality 10/29/2022   Annual physical exam 09/22/2022   Cerebrovascular disease 09/22/2022   History of DVT (deep vein thrombosis) 09/22/2022   Spastic hemiplegia of left nondominant side as late effect of cerebral infarction (HCC) 09/22/2022   Seizure disorder (HCC) 09/22/2022   BMI 27.0-27.9,adult 09/22/2022   Mild episode of recurrent major depressive disorder 06/15/2022   Status post craniectomy 02/09/2022   Urinary retention 09/18/2021   Vitamin D  deficiency 11/04/2020   Hyperlipidemia 03/28/2018   Diabetes mellitus without complication (HCC) 02/23/2018   Essential hypertension 02/23/2018   ONSET DATE: 2023 REFERRING DIAG: Spastic Hemiplegia  THERAPY DIAG:  Muscle weakness (generalized)  Difficulty in walking, not elsewhere classified  Abnormality of gait and mobility  Unsteadiness on feet  Other lack of coordination  Rationale for Evaluation and Treatment: rehab  SUBJECTIVE:  SUBJECTIVE STATEMENT:  Patient doing well today with no changes or concerns since previous session.   Pt accompanied by: self  PERTINENT HISTORY:   48yoM with history of spastic Left hemiplegia s/p craniectomy. Pt previously in rehab at [facility], then at [facility] main for much of 2024. Pt reports  functional plateau at home, has remained modI to supervision for much of his ADL. Pt uses a power WC for most mobility at present, still trying to practice AMB in home with HW and with PCA ~3x months, <176ft typically. Pt sleeps in adjustable bed, reports at least a couple falls related to bed to/from [facility] transfers. Pt reports he has not been able to work on his strength and mobility as he would like since DC, feels like he is able to work harder and accomplish more in a PT setting, is referred back to continue to progress with all mobility and improve his general safety.  PAIN:  Are you having pain? No  PRECAUTIONS: Fall, restricted LUE   WEIGHT BEARING RESTRICTIONS: Yes NWB LUE  FALLS: Has patient fallen in last 6 months? Yes. Number of falls many  LIVING ENVIRONMENT: Lives with: wife, with PCA coverage Lives in: House/apartment Stairs: No Has following equipment at home: Quad cane large base, Hemi walker, Wheelchair (power), and Ramped entry  PATIENT GOALS: Get stronger, safer, more independent with ADL mobility  OBJECTIVE:  Note: Objective measures were completed at evaluation unless otherwise noted.  SENSATION: *Pt has known anesthesia of LLE of note given intermittent use of carbon fiber AFO  LOWER EXTREMITY MMT:    MMT Right Eval Left Eval  Knee flexion    Knee extension    Ankle dorsiflexion  0/5  Ankle plantarflexion  0/5  Ankle inversion  0/5  Ankle eversion  0/5  (Blank rows = not tested)  TRANSFERS: Pull to stand, step pivot car transfers STS push off for step pivot transfers to/from power WC  GAIT:  RUE HW 3-point gait; has not been using his AFO at home, feel like this is only for when doing PT.   FUNCTIONAL TESTS:   -pull to stand, step pivot transfer from [facility] to plinth, car transfer simulation, minGuardA to supervision -STS transfer c RUE HW x1, then static stance x2 minutes half hands free, half with RUE support -STS to HW x5, minGuardA 18.1sec   -seated lateral scoot 1x57ft bilat c AFO donned  -seated RUE reach to floor, then upright sitting x5 -STS from plinth to [facility], then AMB 4x75ft c HW and 3 turns -STS from plinth to [facility], then AMB 74ft,minGuardA                                                                                                                               TREATMENT DATE 04/11/24:    TA- To improve functional movements patterns for everyday tasks   Arrives in power WC. Improved awareness for navigation of door frames in chair today. *  increased time spent by PT donning left AFO today.   Standing pivot transfer to mat from [facility] with verbal cues for proper foot positioning, seat positioning, and trunk lean for optimal transfer. CGA required for safety.   Sit to stands from mat with high five on L side with therapist with each standing rep x10 while maintaining balance and promoting weight bearing through the L LE.   Static standing -focusing on equal weight bearing and upright posture. Verb cues and tactile cues given to extend at the hips and thoracic region as well as at the L knee.   Dynamic standing activities- Standing at balance station with CGA and blocking of the L knee to prevent buckling. Pt performed 10 marches on the L and 10 toe taps on the R to 1/2 roll placed 6'' in front of R foot.   Reactive balance - Standing at balance station with perturbations 2x10 for improved balance reaction time/balance strategies.    Gait training:  Patient ambulated 10' x2 with CGA to and from balance station with cues for posture and increased safety using hemi-walker.   PATIENT EDUCATION: Education details: Should be using AFO at home when mobilizing to improve safety, should also use insole over carbon fiber plate Person educated: Patient and Spouse Education method: collaborative learning, deliberate practice, positive reinforcement, explicit instruction, establish rules. Education comprehension: fair, difficult to  convince  HOME EXERCISE PROGRAM: None  GOALS: Goals reviewed with patient? No  SHORT TERM GOALS: Target date: 03/31/24  Pt to demonstrate 5xSTS ad lib fromstandard height in <14sec without LOB  Baseline: 18sec Goal status: INITIAL  2.  Pt to demonstrate ability to perform sustained standing dual tasking with intermittent support without LOB x5 minutes  Baseline: 2 min at eval  Goal status: INITIAL  3.  Pt to demonstrate >12ft  Baseline:  Goal status: INITIAL  LONG TERM GOALS: Target date: 04/30/24  30sec chair rise, technique ad lib, >11x without LOB Baseline: 10/28: 6.5 with heavy UE support on the RUE . Was noted not to let go of arm rest on the R side. 04/07/2024=  Goal status: INITIAL  2.  > 353ft without LOB with HW  Baseline:10/28 29ft with 1 seated rest break Goal status: INITIAL  3.  Pt to reports consistent use of AFO in house when performing gait training.  Baseline: states that he does not wear it often because he doesn't walk much.  Goal status: INITIAL  4.  Pt to report no falls in most recent 4 weeks.  Baseline: falls often, sometimes with bed transfers and without footwear. Goal status: INITIAL ASSESSMENT:  CLINICAL IMPRESSION:  Treatment focused on functional movements and balance again today. He began session with sit to stands to improve his ability to perform transfers at home with cuing for positioning. This task was combined with high fives to the L to promote increased L weight bearing and improved balance. Pt then ambulated to balance station where he continued with balance activities adding reactive balance today. During pertubations exercise he was noted to lose balance more easily when pushed to the R due to already leaning to R. Upon 2nd trial with this exercise he made significant improvement with COG more medial and over his BOS. We also worked on standing posture today with cuing for improved L knee, bilateral hip, and trunk extension.  Patient responded well to cuing correcting quickly and keeping corrected throughout each activity. He continues to be highly motivated to improve throughout POC  and would benefit from continued PT at this time.    OBJECTIVE IMPAIRMENTS: Decreased knowledge of condition, decreased use of DME, decreased mobility, difficulty walking, decreased strength, decreased ROM. ACTIVITY LIMITATIONS: Lifting, standing, walking, squatting, transfers, locomotion level PARTICIPATION LIMITATIONS: Cleaning, laundry, interpersonal relationships, driving, yardwork, community activity.  PERSONAL FACTORS: Age, behavior pattern, education, past/current experiences, transportation, profession  are also affecting patient's functional outcome.  REHAB POTENTIAL: Good CLINICAL DECISION MAKING: Medium  EVALUATION COMPLEXITY: Moderate    PLAN:  PT FREQUENCY: 1-2x/week  PT DURATION: 8 weeks  PLANNED INTERVENTIONS: 97110-Therapeutic exercises, 97530- Therapeutic activity, [id number]- Neuromuscular re-education, 97535- Self Care, [id number]- Manual therapy, [phone]- Gait training, [phone]- Orthotic Initial, Patient/Family education, Balance training, and Stair training  PLAN FOR NEXT SESSION: transfers and gait training (life gait as appropriate). Patient requested fall recovery training in near future.  Norman KATHEE Sharps, PT 04/27/2024, 11:52 AM

## 2024-05-02 ENCOUNTER — Ambulatory Visit: Admitting: Physical Therapy

## 2024-05-02 ENCOUNTER — Ambulatory Visit

## 2024-05-04 ENCOUNTER — Ambulatory Visit: Admitting: Physical Therapy

## 2024-05-04 DIAGNOSIS — R269 Unspecified abnormalities of gait and mobility: Secondary | ICD-10-CM

## 2024-05-04 DIAGNOSIS — R262 Difficulty in walking, not elsewhere classified: Secondary | ICD-10-CM

## 2024-05-04 DIAGNOSIS — R2681 Unsteadiness on feet: Secondary | ICD-10-CM

## 2024-05-04 DIAGNOSIS — M6281 Muscle weakness (generalized): Secondary | ICD-10-CM | POA: Diagnosis not present

## 2024-05-04 DIAGNOSIS — R278 Other lack of coordination: Secondary | ICD-10-CM

## 2024-05-04 NOTE — Therapy (Unsigned)
 OUTPATIENT PHYSICAL THERAPY TREATMENT/ PROGRESS NOTE/ RECERT  Patient Name: Jacob [name] MRN: [id number] DOB:03-May-1976, 48 y.o., male Today's Date: 05/04/2024  Dates of reporting period 02/29/2024  to 05/04/2024   PCP: [name], MD REFERRING PROVIDER: [name], Mayo, MD   PT End of Session - 05/04/24 1307     Visit Number 10    Number of Visits 16    Date for Recertification  06/29/24    Authorization Type [employer]    Authorization Time Period 11/17-12/31    Authorization - Number of Visits 8    Progress Note Due on Visit 10    PT Start Time 1145    PT Stop Time 1225    PT Time Calculation (min) 40 min    Equipment Utilized During Treatment Gait belt    Activity Tolerance Patient tolerated treatment well    Behavior During Therapy [facility] for tasks assessed/performed          Past Medical History:  Diagnosis Date   Allergy    Depression    Diabetes mellitus without complication (HCC)    GERD (gastroesophageal reflux disease)    Hypertension    Paralysis (HCC)    Seizures (HCC)    Stroke [facility])    Past Surgical History:  Procedure Laterality Date   APPENDECTOMY     BUBBLE STUDY  10/02/2021   Procedure: BUBBLE STUDY;  Surgeon: Okey Vina GAILS, MD;  Location: [facility] ENDOSCOPY;  Service: Cardiovascular;;   CRANIOPLASTY Right 02/09/2022   Procedure: Craniectomy with Replacement of Bone Flap From the Abdomen;  Surgeon: [name], MD;  Location: [facility] OR;  Service: Neurosurgery;  Laterality: Right;   CRANIOTOMY Right 09/15/2021   Procedure: RIGHT DECOMPRESSIVE CRANIOTOMY, PLACEMENT OF BONE FLAP IN ABDOMEN;  Surgeon: [name], MD;  Location: [facility] OR;  Service: Neurosurgery;  Laterality: Right;   IR CT HEAD LTD  09/12/2021   IR PERCUTANEOUS ART THROMBECTOMY/INFUSION INTRACRANIAL INC DIAG ANGIO  09/12/2021   IR US  GUIDE VASC ACCESS RIGHT  09/12/2021   LAPAROSCOPIC APPENDECTOMY  10/24/2013   Procedure: APPENDECTOMY LAPAROSCOPIC;  Surgeon: [name], MD;  Location: [facility]  ORS;  Service: General;;   RADIOLOGY WITH ANESTHESIA N/A 09/12/2021   Procedure: IR WITH ANESTHESIA;  Surgeon: Dolphus Carrion, MD;  Location: [facility] OR;  Service: Radiology;  Laterality: N/A;   TEE WITHOUT CARDIOVERSION N/A 10/02/2021   Procedure: TRANSESOPHAGEAL ECHOCARDIOGRAM (TEE);  Surgeon: Okey Vina GAILS, MD;  Location: [facility] ENDOSCOPY;  Service: Cardiovascular;  Laterality: N/A;   Patient Active Problem List   Diagnosis Date Noted   Falls 09/06/2023   Hypercholesterolemia 09/06/2023   Weight loss 07/26/2023   Diabetes mellitus (HCC) 01/21/2023   Hypercholesteremia 01/21/2023   Gait abnormality 10/29/2022   Annual physical exam 09/22/2022   Cerebrovascular disease 09/22/2022   History of DVT (deep vein thrombosis) 09/22/2022   Spastic hemiplegia of left nondominant side as late effect of cerebral infarction (HCC) 09/22/2022   Seizure disorder (HCC) 09/22/2022   BMI 27.0-27.9,adult 09/22/2022   Mild episode of recurrent major depressive disorder 06/15/2022   Status post craniectomy 02/09/2022   Urinary retention 09/18/2021   Vitamin D  deficiency 11/04/2020   Hyperlipidemia 03/28/2018   Diabetes mellitus without complication (HCC) 02/23/2018   Essential hypertension 02/23/2018   ONSET DATE: 2023 REFERRING DIAG: Spastic Hemiplegia  THERAPY DIAG:  Muscle weakness (generalized)  Difficulty in walking, not elsewhere classified  Abnormality of gait and mobility  Unsteadiness on feet  Other lack of coordination  Rationale for Evaluation  and Treatment: rehab  SUBJECTIVE:                                                                                                                                                                                             SUBJECTIVE STATEMENT:  Patient doing well today, no updates reported.  Pt accompanied by: self  PERTINENT HISTORY:    48 y.o. M with history of spastic Left hemiplegia s/p craniectomy. Pt previously in rehab at [facility], then  at [facility] main for much of 2024. Pt reports functional plateau at home, has remained modI to supervision for much of his ADL. Pt uses a power WC for most mobility at present, still trying to practice AMB in home with HW and with PCA ~3x months, <110ft typically. Pt sleeps in adjustable bed, reports at least a couple falls related to bed to/from [facility] transfers. Pt reports he has not been able to work on his strength and mobility as he would like since DC, feels like he is able to work harder and accomplish more in a PT setting, is referred back to continue to progress with all mobility and improve his general safety.  PAIN:  Are you having pain? No  PRECAUTIONS: Fall, restricted LUE   WEIGHT BEARING RESTRICTIONS: Yes NWB LUE  FALLS: Has patient fallen in last 6 months? Yes. Number of falls many  LIVING ENVIRONMENT: Lives with: wife, with PCA coverage Lives in: House/apartment Stairs: No Has following equipment at home: Quad cane large base, Hemi walker, Wheelchair (power), and Ramped entry  PATIENT GOALS: Get stronger, safer, more independent with ADL mobility  OBJECTIVE:  Note: Objective measures were completed at evaluation unless otherwise noted.  SENSATION: *Pt has known anesthesia of LLE of note given intermittent use of carbon fiber AFO  LOWER EXTREMITY MMT:    MMT Right Eval Left Eval  Knee flexion    Knee extension    Ankle dorsiflexion  0/5  Ankle plantarflexion  0/5  Ankle inversion  0/5  Ankle eversion  0/5  (Blank rows = not tested)  TRANSFERS: Pull to stand, step pivot car transfers STS push off for step pivot transfers to/from power WC  GAIT:  RUE HW 3-point gait; has not been using his AFO at home, feel like this is only for when doing PT.   FUNCTIONAL TESTS:   -pull to stand, step pivot transfer from [facility] to plinth, car transfer simulation, minGuardA to supervision -STS transfer c RUE HW x1, then static stance x2 minutes half hands free, half with RUE  support -STS to HW x5, minGuardA 18.1sec  -seated lateral scoot 1x19ft bilat c  AFO donned  -seated RUE reach to floor, then upright sitting x5 -STS from plinth to [facility], then AMB 4x27ft c HW and 3 turns -STS from plinth to [facility], then AMB 58ft,minGuardA                                                                                                                               TREATMENT DATE 04/11/24:    Self-Care: Arrives in power WC. Increased time spent by SPT donning AFO -Instructions to patient on learning to don AFO with minimal assistance and use of shoe horn  Physical Performance Test or Measurement: a  physical performance test(s) or measurement (eg,  musculoskeletal, functional capacity), with written report,  each 15 mins    Five times Sit to Stand Test (FTSS) TIME: 18.18 sec with right UE maintained on chair for entirety of test -SPT on other side of chair providing counter force to prevent chair from tilting  Cut off scores indicative of increased fall risk: >12 sec CVA, >16 sec PD, >13 sec vestibular (ANPTA Core Set of Outcome Measures for Adults with Neurologic Conditions, 2018)   30 second chair rise: x 7 with right arm maintained on chair for entirety of test -SPT on other side of chair providing counter force to prevent chair from tilting.  6 Min Walk Test:  Instructed patient to ambulate as quickly and as safely as possible for 6 minutes using LRAD. Patient was allowed to take standing rest breaks without stopping the test, but if the patient required a sitting rest break the clock would be stopped and the test would be over.  Results: 30ft feet using a HW. Results indicate that the patient has reduced endurance with ambulation compared to age matched norms.  Age Matched Norms (in meters): 4-69 yo M: 47 F: 69, 76-79 yo M: 70 F: 471, 42-89 yo M: 417 F: 392 MDC: 58.21 meters (190.98 feet) or 50 meters (ANPTA Core Set of Outcome Measures for Adults with Neurologic  Conditions, 2018)  -During with HW and wheelchair follow pt experienced significant loss of balance that required mod A from SPT to recover. This occurred around 59ft, but pt was able to continue walking for 25 more feet before requiring a seat.   TA- To improve functional movements patterns for everyday tasks    Sustained standing dual tasking with intermittent support without LOB: 5 min 40 sec  -Standing at bar arranging letter magnets in alphabetical order -intermittent UE use on bar   Self-care: SPT doffed AFO at end of session and returned WC leg rests  PATIENT EDUCATION: Education details: Should be using AFO at home when mobilizing to improve safety, should also use insole over carbon fiber plate Person educated: Patient and Spouse Education method: collaborative learning, deliberate practice, positive reinforcement, explicit instruction, establish rules. Education comprehension: fair, difficult to convince  HOME EXERCISE PROGRAM: None  GOALS: Goals reviewed with patient? No  SHORT TERM GOALS: Target  date: 03/31/24  Pt to demonstrate 5xSTS ad lib fromstandard height in <14sec without LOB  Baseline: 18sec; 12/11: 18.18 sec right UE maintained on chair for entirty of test   Goal status: IN PROGRESS  2.  Pt to demonstrate ability to perform sustained standing dual tasking with intermittent support without LOB x5 minutes  Baseline: 2 min at eval; 12/11 5 min 40 seconds Goal status: MET  3.  Pt to demonstrate >153ft  Baseline: 12/11: 51 ft Goal status: IN PROGRESS  LONG TERM GOALS: Target date: 06/29/24  30sec chair rise, technique ad lib, >11x without LOB Baseline: 10/28: 6.5 with heavy UE support on the RUE . Was noted not to let go of arm rest on the R side. 12/11: 7 with right UE maintained on chair for entirety of session    Goal status: IN PROGRESS  2.  > 339ft without LOB with HW  Baseline:10/28 11ft with 1 seated rest break, 12/11 51 ft w/ HW,  LOB  requiring MOD A from SPT Goal status: IN PROGRESS  3.  Pt to reports consistent use of AFO in house when performing gait training.  Baseline: states that he does not wear it often because he doesn't walk much. 12/9: states he does not walk much in the house and therefor does not need the AFO Goal status: DISCONTINUED until able to ambulate consistently and safely   4.  Pt to report no falls in most recent 4 weeks.  Baseline: falls often, sometimes with bed transfers and without footwear. 12/11: states he may have had 1 fall but cannot remember  Goal status: IN PROGRESS ASSESSMENT:  CLINICAL IMPRESSION:  Pt presented today for progress note. Pt was not wearing AFO upon arrival and required SPT to don. Pt instructed on how to don with shoe horn in order to work toward increased independence with donning AFO. Pt does not walk when at home and therefore does not wear AFO when in his home. Also, due to safety concerns as demonstrated in , goal has been discontinued until patient can safely and consistently ambulate without risk of fall and injury. When completing with HW and wheelchair follow pt experienced significant loss of balance that required mod A from SPT to recover. This occurred around 71ft, but pt was able to continue walking for 25 more feet before requiring a seat. Pt did demonstrate increased standing tolerance at this session and was able to maintain standing for over 5 minutes with intermittent UE support. 5xSTS and 30 sec chair stand require significant UE push off so much so that SPT had to provide counter force to prevent chair from tilting. Pt unable to fully come to standing and maintains right UE on chair for entirety of test. Pt unable to accurately report fall history, but believes he has had at least 1 recent fall. Throughout pt requires frequent redirection. Chronicity of pts stroke has resulted in slower progress, but progress is still being met. Patient's condition has  the potential to improve in response to therapy. Maximum improvement is yet to be obtained. The anticipated improvement is attainable and reasonable in a generally predictable time.     OBJECTIVE IMPAIRMENTS: Decreased knowledge of condition, decreased use of DME, decreased mobility, difficulty walking, decreased strength, decreased ROM. ACTIVITY LIMITATIONS: Lifting, standing, walking, squatting, transfers, locomotion level PARTICIPATION LIMITATIONS: Cleaning, laundry, interpersonal relationships, driving, yardwork, community activity.  PERSONAL FACTORS: Age, behavior pattern, education, past/current experiences, transportation, profession  are also affecting patient's functional outcome.  REHAB POTENTIAL: Good CLINICAL DECISION MAKING: Medium  EVALUATION COMPLEXITY: Moderate    PLAN:  PT FREQUENCY: 1-2x/week  PT DURATION: 8 weeks  PLANNED INTERVENTIONS: 97110-Therapeutic exercises, 97530- Therapeutic activity, [id number]- Neuromuscular re-education, 97535- Self Care, [id number]- Manual therapy, [phone]- Gait training, [phone]- Orthotic Initial, Patient/Family education, Balance training, and Stair training  PLAN FOR NEXT SESSION:  Transfers and gait training   Patient requested fall recovery training in near future. Sit to stands with decrease right UE support  Leonor Rode, Student-SPT 05/04/2024, 5:22 PM

## 2024-05-08 ENCOUNTER — Ambulatory Visit

## 2024-05-08 NOTE — Therapy (Incomplete)
 OUTPATIENT PHYSICAL THERAPY TREATMENT/ PROGRESS NOTE/ RECERT  Patient Name: Jacob [name] MRN: [id number] DOB:10-06-1975, 48 y.o., male Today's Date: 05/08/2024  Dates of reporting period 02/29/2024  to 05/04/2024   PCP: [name], MD REFERRING PROVIDER: [name], Mayo, MD     Past Medical History:  Diagnosis Date   Allergy    Depression    Diabetes mellitus without complication (HCC)    GERD (gastroesophageal reflux disease)    Hypertension    Paralysis (HCC)    Seizures (HCC)    Stroke [facility])    Past Surgical History:  Procedure Laterality Date   APPENDECTOMY     BUBBLE STUDY  10/02/2021   Procedure: BUBBLE STUDY;  Surgeon: Okey Vina GAILS, MD;  Location: [facility] ENDOSCOPY;  Service: Cardiovascular;;   CRANIOPLASTY Right 02/09/2022   Procedure: Craniectomy with Replacement of Bone Flap From the Abdomen;  Surgeon: [name], MD;  Location: [facility] OR;  Service: Neurosurgery;  Laterality: Right;   CRANIOTOMY Right 09/15/2021   Procedure: RIGHT DECOMPRESSIVE CRANIOTOMY, PLACEMENT OF BONE FLAP IN ABDOMEN;  Surgeon: [name], MD;  Location: [facility] OR;  Service: Neurosurgery;  Laterality: Right;   IR CT HEAD LTD  09/12/2021   IR PERCUTANEOUS ART THROMBECTOMY/INFUSION INTRACRANIAL INC DIAG ANGIO  09/12/2021   IR US  GUIDE VASC ACCESS RIGHT  09/12/2021   LAPAROSCOPIC APPENDECTOMY  10/24/2013   Procedure: APPENDECTOMY LAPAROSCOPIC;  Surgeon: [name], MD;  Location: [facility] ORS;  Service: General;;   RADIOLOGY WITH ANESTHESIA N/A 09/12/2021   Procedure: IR WITH ANESTHESIA;  Surgeon: Dolphus Carrion, MD;  Location: [facility] OR;  Service: Radiology;  Laterality: N/A;   TEE WITHOUT CARDIOVERSION N/A 10/02/2021   Procedure: TRANSESOPHAGEAL ECHOCARDIOGRAM (TEE);  Surgeon: Okey Vina GAILS, MD;  Location: [facility] ENDOSCOPY;  Service: Cardiovascular;  Laterality: N/A;   Patient Active Problem List   Diagnosis Date Noted   Falls 09/06/2023   Hypercholesterolemia 09/06/2023   Weight loss 07/26/2023    Diabetes mellitus (HCC) 01/21/2023   Hypercholesteremia 01/21/2023   Gait abnormality 10/29/2022   Annual physical exam 09/22/2022   Cerebrovascular disease 09/22/2022   History of DVT (deep vein thrombosis) 09/22/2022   Spastic hemiplegia of left nondominant side as late effect of cerebral infarction (HCC) 09/22/2022   Seizure disorder (HCC) 09/22/2022   BMI 27.0-27.9,adult 09/22/2022   Mild episode of recurrent major depressive disorder 06/15/2022   Status post craniectomy 02/09/2022   Urinary retention 09/18/2021   Vitamin D  deficiency 11/04/2020   Hyperlipidemia 03/28/2018   Diabetes mellitus without complication (HCC) 02/23/2018   Essential hypertension 02/23/2018   ONSET DATE: 2023 REFERRING DIAG: Spastic Hemiplegia  THERAPY DIAG:  No diagnosis found.  Rationale for Evaluation and Treatment: Rehab  SUBJECTIVE:  SUBJECTIVE STATEMENT:  Patient doing well today, no updates reported.   Pt accompanied by: self  PERTINENT HISTORY:    48 y.o. M with history of spastic Left hemiplegia s/p craniectomy. Pt previously in rehab at [facility], then at [facility] main for much of 2024. Pt reports functional plateau at home, has remained modI to supervision for much of his ADL. Pt uses a power WC for most mobility at present, still trying to practice AMB in home with HW and with PCA ~3x months, <141ft typically. Pt sleeps in adjustable bed, reports at least a couple falls related to bed to/from [facility] transfers. Pt reports he has not been able to work on his strength and mobility as he would like since DC, feels like he is able to work harder and accomplish more in a PT setting, is referred back to continue to progress with all mobility and improve his general safety.  PAIN:  Are you having pain? No  PRECAUTIONS:  Fall, restricted LUE   WEIGHT BEARING RESTRICTIONS: Yes NWB LUE  FALLS: Has patient fallen in last 6 months? Yes. Number of falls many  LIVING ENVIRONMENT: Lives with: wife, with PCA coverage Lives in: House/apartment Stairs: No Has following equipment at home: Quad cane large base, Hemi walker, Wheelchair (power), and Ramped entry  PATIENT GOALS: Get stronger, safer, more independent with ADL mobility  OBJECTIVE:  Note: Objective measures were completed at evaluation unless otherwise noted.  SENSATION: *Pt has known anesthesia of LLE of note given intermittent use of carbon fiber AFO  LOWER EXTREMITY MMT:    MMT Right Eval Left Eval  Knee flexion    Knee extension    Ankle dorsiflexion  0/5  Ankle plantarflexion  0/5  Ankle inversion  0/5  Ankle eversion  0/5  (Blank rows = not tested)  TRANSFERS: Pull to stand, step pivot car transfers STS push off for step pivot transfers to/from power WC  GAIT:  RUE HW 3-point gait; has not been using his AFO at home, feel like this is only for when doing PT.   FUNCTIONAL TESTS:   -pull to stand, step pivot transfer from [facility] to plinth, car transfer simulation, minGuardA to supervision -STS transfer c RUE HW x1, then static stance x2 minutes half hands free, half with RUE support -STS to HW x5, minGuardA 18.1sec  -seated lateral scoot 1x42ft bilat c AFO donned  -seated RUE reach to floor, then upright sitting x5 -STS from plinth to [facility], then AMB 4x63ft c HW and 3 turns -STS from plinth to [facility], then AMB 48ft,minGuardA                                                                                                                               TREATMENT DATE 04/11/24:    Self-Care: Arrives in power WC. Increased time spent by SPT donning AFO -Instructions to patient on learning to don AFO with minimal assistance and use of shoe horn  Physical Performance Test or Measurement: a  physical performance test(s) or measurement (eg,   musculoskeletal, functional capacity), with written report,  each 15 mins    Five times Sit to Stand Test (FTSS) TIME: 18.18 sec with right UE maintained on chair for entirety of test -SPT on other side of chair providing counter force to prevent chair from tilting  Cut off scores indicative of increased fall risk: >12 sec CVA, >16 sec PD, >13 sec vestibular (ANPTA Core Set of Outcome Measures for Adults with Neurologic Conditions, 2018)   30 second chair rise: x 7 with right arm maintained on chair for entirety of test -SPT on other side of chair providing counter force to prevent chair from tilting.  6 Min Walk Test:  Instructed patient to ambulate as quickly and as safely as possible for 6 minutes using LRAD. Patient was allowed to take standing rest breaks without stopping the test, but if the patient required a sitting rest break the clock would be stopped and the test would be over.  Results: 20ft feet using a HW. Results indicate that the patient has reduced endurance with ambulation compared to age matched norms.  Age Matched Norms (in meters): 70-69 yo M: 73 F: 50, 74-79 yo M: 59 F: 471, 64-89 yo M: 417 F: 392 MDC: 58.21 meters (190.98 feet) or 50 meters (ANPTA Core Set of Outcome Measures for Adults with Neurologic Conditions, 2018)  -During with HW and wheelchair follow pt experienced significant loss of balance that required mod A from SPT to recover. This occurred around 38ft, but pt was able to continue walking for 25 more feet before requiring a seat.   TA- To improve functional movements patterns for everyday tasks    Sustained standing dual tasking with intermittent support without LOB: 5 min 40 sec  -Standing at bar arranging letter magnets in alphabetical order -intermittent UE use on bar   Self-care: SPT doffed AFO at end of session and returned WC leg rests  PATIENT EDUCATION: Education details: Should be using AFO at home when mobilizing to improve  safety, should also use insole over carbon fiber plate Person educated: Patient and Spouse Education method: collaborative learning, deliberate practice, positive reinforcement, explicit instruction, establish rules. Education comprehension: fair, difficult to convince  HOME EXERCISE PROGRAM: None  GOALS: Goals reviewed with patient? No  SHORT TERM GOALS: Target date: 06/02/2024   Pt to demonstrate 5xSTS ad lib fromstandard height in <14sec without LOB  Baseline: 18sec; 12/11: 18.18 sec right UE maintained on chair for entirty of test   Goal status: IN PROGRESS  2.  Pt to demonstrate ability to perform sustained standing dual tasking with intermittent support without LOB x5 minutes  Baseline: 2 min at eval; 12/11 5 min 40 seconds Goal status: MET  3.  Pt to demonstrate >154ft  Baseline: 12/11: 51 ft Goal status: IN PROGRESS  LONG TERM GOALS: Target date: 06/29/24  30sec chair rise, technique ad lib, >11x without LOB Baseline: 10/28: 6.5 with heavy UE support on the RUE . Was noted not to let go of arm rest on the R side. 12/11: 7 with right UE maintained on chair for entirety of session    Goal status: IN PROGRESS  2.  > 36ft without LOB with HW  Baseline:10/28 21ft with 1 seated rest break, 12/11 51 ft w/ HW,  LOB requiring MOD A from SPT Goal status: IN PROGRESS  3.  Pt to reports consistent use of AFO in  house when performing gait training.  Baseline: states that he does not wear it often because he doesn't walk much. 12/9: states he does not walk much in the house and therefor does not need the AFO Goal status: DISCONTINUED until able to ambulate consistently and safely   4.  Pt to report no falls in most recent 4 weeks.  Baseline: falls often, sometimes with bed transfers and without footwear. 12/11: states he may have had 1 fall but cannot remember  Goal status: IN PROGRESS ASSESSMENT:  CLINICAL IMPRESSION:  Pt presented today for progress note. Pt was not  wearing AFO upon arrival and required SPT to don. Pt instructed on how to don with shoe horn in order to work toward increased independence with donning AFO. Pt does not walk when at home and therefore does not wear AFO when in his home. Also, due to safety concerns as demonstrated in , goal has been discontinued until patient can safely and consistently ambulate without risk of fall and injury. When completing with HW and wheelchair follow pt experienced significant loss of balance that required mod A from SPT to recover. This occurred around 9ft, but pt was able to continue walking for 25 more feet before requiring a seat. Pt did demonstrate increased standing tolerance at this session and was able to maintain standing for over 5 minutes with intermittent UE support. 5xSTS and 30 sec chair stand require significant UE push off so much so that SPT had to provide counter force to prevent chair from tilting. Pt unable to fully come to standing and maintains right UE on chair for entirety of test. Pt unable to accurately report fall history, but believes he has had at least 1 recent fall. Throughout pt requires frequent redirection. Chronicity of pts stroke has resulted in slower progress, but progress is still being met. Patient's condition has the potential to improve in response to therapy. Maximum improvement is yet to be obtained. The anticipated improvement is attainable and reasonable in a generally predictable time.     OBJECTIVE IMPAIRMENTS: Decreased knowledge of condition, decreased use of DME, decreased mobility, difficulty walking, decreased strength, decreased ROM. ACTIVITY LIMITATIONS: Lifting, standing, walking, squatting, transfers, locomotion level PARTICIPATION LIMITATIONS: Cleaning, laundry, interpersonal relationships, driving, yardwork, community activity.  PERSONAL FACTORS: Age, behavior pattern, education, past/current experiences, transportation, profession  are also affecting  patient's functional outcome.  REHAB POTENTIAL: Good CLINICAL DECISION MAKING: Medium  EVALUATION COMPLEXITY: Moderate    PLAN:  PT FREQUENCY: 1-2x/week  PT DURATION: 8 weeks  PLANNED INTERVENTIONS: 97110-Therapeutic exercises, 97530- Therapeutic activity, [id number]- Neuromuscular re-education, 97535- Self Care, 02859- Manual therapy, [phone]- Gait training, [phone]- Orthotic Initial, Patient/Family education, Balance training, and Stair training  PLAN FOR NEXT SESSION:  Transfers and gait training   Patient requested fall recovery training in near future. Sit to stands with decrease right UE support   Reyes LOISE London PT  Physical Therapist- [place]  [facility]

## 2024-05-10 ENCOUNTER — Encounter: Admitting: Internal Medicine

## 2024-05-11 ENCOUNTER — Ambulatory Visit

## 2024-05-11 DIAGNOSIS — R278 Other lack of coordination: Secondary | ICD-10-CM

## 2024-05-11 DIAGNOSIS — R262 Difficulty in walking, not elsewhere classified: Secondary | ICD-10-CM

## 2024-05-11 DIAGNOSIS — R2681 Unsteadiness on feet: Secondary | ICD-10-CM

## 2024-05-11 DIAGNOSIS — M6281 Muscle weakness (generalized): Secondary | ICD-10-CM

## 2024-05-11 DIAGNOSIS — R269 Unspecified abnormalities of gait and mobility: Secondary | ICD-10-CM

## 2024-05-11 NOTE — Therapy (Signed)
 OUTPATIENT PHYSICAL THERAPY TREATMENT  Patient Name: Jacob [name] MRN: [id number] DOB:[birth year], 48 y.o., male Today's Date: 05/11/2024  Dates of reporting period 02/29/2024  to 05/04/2024   PCP: [name], MD REFERRING PROVIDER: [name], Mayo, MD   PT End of Session - 05/11/24 1222     Visit Number 11    Number of Visits 16    Date for Recertification  06/29/24    Authorization Type [employer]    Authorization Time Period 11/17-12/31    Authorization - Number of Visits 8    Progress Note Due on Visit 10    PT Start Time 1156    PT Stop Time 1230    PT Time Calculation (min) 34 min    Equipment Utilized During Treatment Gait belt    Activity Tolerance Patient tolerated treatment well    Behavior During Therapy WFL for tasks assessed/performed           Past Medical History:  Diagnosis Date   Allergy    Depression    Diabetes mellitus without complication (HCC)    GERD (gastroesophageal reflux disease)    Hypertension    Paralysis (HCC)    Seizures (HCC)    Stroke [facility])    Past Surgical History:  Procedure Laterality Date   APPENDECTOMY     BUBBLE STUDY  10/02/2021   Procedure: BUBBLE STUDY;  Surgeon: Okey Vina GAILS, MD;  Location: [facility] ENDOSCOPY;  Service: Cardiovascular;;   CRANIOPLASTY Right 02/09/2022   Procedure: Craniectomy with Replacement of Bone Flap From the Abdomen;  Surgeon: [name], MD;  Location: [facility] OR;  Service: Neurosurgery;  Laterality: Right;   CRANIOTOMY Right 09/15/2021   Procedure: RIGHT DECOMPRESSIVE CRANIOTOMY, PLACEMENT OF BONE FLAP IN ABDOMEN;  Surgeon: [name], MD;  Location: [facility] OR;  Service: Neurosurgery;  Laterality: Right;   IR CT HEAD LTD  09/12/2021   IR PERCUTANEOUS ART THROMBECTOMY/INFUSION INTRACRANIAL INC DIAG ANGIO  09/12/2021   IR US  GUIDE VASC ACCESS RIGHT  09/12/2021   LAPAROSCOPIC APPENDECTOMY  10/24/2013   Procedure: APPENDECTOMY LAPAROSCOPIC;  Surgeon: [name], MD;  Location: [facility] ORS;  Service:  General;;   RADIOLOGY WITH ANESTHESIA N/A 09/12/2021   Procedure: IR WITH ANESTHESIA;  Surgeon: Dolphus Carrion, MD;  Location: [facility] OR;  Service: Radiology;  Laterality: N/A;   TEE WITHOUT CARDIOVERSION N/A 10/02/2021   Procedure: TRANSESOPHAGEAL ECHOCARDIOGRAM (TEE);  Surgeon: Okey Vina GAILS, MD;  Location: [facility] ENDOSCOPY;  Service: Cardiovascular;  Laterality: N/A;   Patient Active Problem List   Diagnosis Date Noted   Falls 09/06/2023   Hypercholesterolemia 09/06/2023   Weight loss 07/26/2023   Diabetes mellitus (HCC) 01/21/2023   Hypercholesteremia 01/21/2023   Gait abnormality 10/29/2022   Annual physical exam 09/22/2022   Cerebrovascular disease 09/22/2022   History of DVT (deep vein thrombosis) 09/22/2022   Spastic hemiplegia of left nondominant side as late effect of cerebral infarction (HCC) 09/22/2022   Seizure disorder (HCC) 09/22/2022   BMI 27.0-27.9,adult 09/22/2022   Mild episode of recurrent major depressive disorder 06/15/2022   Status post craniectomy 02/09/2022   Urinary retention 09/18/2021   Vitamin D  deficiency 11/04/2020   Hyperlipidemia 03/28/2018   Diabetes mellitus without complication (HCC) 02/23/2018   Essential hypertension 02/23/2018   ONSET DATE: 2023 REFERRING DIAG: Spastic Hemiplegia  THERAPY DIAG:  Muscle weakness (generalized)  Difficulty in walking, not elsewhere classified  Abnormality of gait and mobility  Unsteadiness on feet  Other lack of coordination  Rationale for Evaluation and Treatment:  Rehab  SUBJECTIVE:                                                                                                                                                                                             SUBJECTIVE STATEMENT:  Patient doing well today, no updates reported. Arrives late to scheduled PT no reason provided.   Pt accompanied by: self  PERTINENT HISTORY:    48 y.o. M with history of spastic Left hemiplegia s/p craniectomy. Pt  previously in rehab at [facility], then at [facility] main for much of 2024. Pt reports functional plateau at home, has remained modI to supervision for much of his ADL. Pt uses a power WC for most mobility at present, still trying to practice AMB in home with HW and with PCA ~3x months, <169ft typically. Pt sleeps in adjustable bed, reports at least a couple falls related to bed to/from [facility] transfers. Pt reports he has not been able to work on his strength and mobility as he would like since DC, feels like he is able to work harder and accomplish more in a PT setting, is referred back to continue to progress with all mobility and improve his general safety.  PAIN:  Are you having pain? No  PRECAUTIONS: Fall, restricted LUE   WEIGHT BEARING RESTRICTIONS: Yes NWB LUE  FALLS: Has patient fallen in last 6 months? Yes. Number of falls many  LIVING ENVIRONMENT: Lives with: wife, with PCA coverage Lives in: House/apartment Stairs: No Has following equipment at home: Quad cane large base, Hemi walker, Wheelchair (power), and Ramped entry  PATIENT GOALS: Get stronger, safer, more independent with ADL mobility  OBJECTIVE:  Note: Objective measures were completed at evaluation unless otherwise noted.  SENSATION: *Pt has known anesthesia of LLE of note given intermittent use of carbon fiber AFO  LOWER EXTREMITY MMT:    MMT Right Eval Left Eval  Knee flexion    Knee extension    Ankle dorsiflexion  0/5  Ankle plantarflexion  0/5  Ankle inversion  0/5  Ankle eversion  0/5  (Blank rows = not tested)  TRANSFERS: Pull to stand, step pivot car transfers STS push off for step pivot transfers to/from power WC  GAIT:  RUE HW 3-point gait; has not been using his AFO at home, feel like this is only for when doing PT.   FUNCTIONAL TESTS:   -pull to stand, step pivot transfer from [facility] to plinth, car transfer simulation, minGuardA to supervision -STS transfer c RUE HW x1, then static stance x2 minutes half  hands free, half with RUE support -STS to HW x5, minGuardA 18.1sec  -  seated lateral scoot 1x38ft bilat c AFO donned  -seated RUE reach to floor, then upright sitting x5 -STS from plinth to [facility], then AMB 4x38ft c HW and 3 turns -STS from plinth to [facility], then AMB 65ft,minGuardA                                                                                                                               TREATMENT DATE 04/11/24:   Transported mod I in power WC to PT gym.   Total A to don AFO.   Sit<>stand from [facility] with min assist from Pt for improved positioning of the LLE and improved anterior weight shift intially, and tactile cues for full trunk extension once in standing,   Lateral weight shift R and L with HW and tactile cuing from PT for improved knee extension with lateral weigh shift to the L.   Gait with HW x 29ft with min assist and moderate instruction for trunk extension. No knee instability noted on this bout, and noted to have improved step length and weight shift with erect posture with vibration stimuli to L clavicle and superior facilitation to axilla.  Prolonged rest break upon completion  Forward/reverse gait 25ft each with HW and AFO with moderate cues for step length, sequencing of RW and min assist for improved trunk extension as listed above   Therapeutic rest break   Side stepping at rail 68ft bil x 2 with RUE on rail. Cues for improved step height and to reduce trunkal rotation with L stepping.   PT doffed AFO.   VS assessment per pt request. 114/85 HR 69   PATIENT EDUCATION: Education details: Should be using AFO at home when mobilizing to improve safety, should also use insole over carbon fiber plate Person educated: Patient and Spouse Education method: collaborative learning, deliberate practice, positive reinforcement, explicit instruction, establish rules. Education comprehension: fair, difficult to convince  HOME EXERCISE PROGRAM: None  GOALS: Goals reviewed  with patient? No  SHORT TERM GOALS: Target date: 06/02/2024   Pt to demonstrate 5xSTS ad lib fromstandard height in <14sec without LOB  Baseline: 18sec; 12/11: 18.18 sec right UE maintained on chair for entirty of test   Goal status: IN PROGRESS  2.  Pt to demonstrate ability to perform sustained standing dual tasking with intermittent support without LOB x5 minutes  Baseline: 2 min at eval; 12/11 5 min 40 seconds Goal status: MET  3.  Pt to demonstrate >163ft  Baseline: 12/11: 51 ft Goal status: IN PROGRESS  LONG TERM GOALS: Target date: 06/29/24  30sec chair rise, technique ad lib, >11x without LOB Baseline: 10/28: 6.5 with heavy UE support on the RUE . Was noted not to let go of arm rest on the R side. 12/11: 7 with right UE maintained on chair for entirety of session    Goal status: IN PROGRESS  2.  > 347ft without LOB with HW  Baseline:10/28 60ft with  1 seated rest break, 12/11 51 ft w/ HW,  LOB requiring MOD A from SPT Goal status: IN PROGRESS  3.  Pt to reports consistent use of AFO in house when performing gait training.  Baseline: states that he does not wear it often because he doesn't walk much. 12/9: states he does not walk much in the house and therefor does not need the AFO Goal status: DISCONTINUED until able to ambulate consistently and safely   4.  Pt to report no falls in most recent 4 weeks.  Baseline: falls often, sometimes with bed transfers and without footwear. 12/11: states he may have had 1 fall but cannot remember  Goal status: IN PROGRESS ASSESSMENT:  CLINICAL IMPRESSION:  Pt arrives motivated to participate. PT treatment focused on functional mobility and WB through LLE through ambulation with AFO and HW. Was noted to have improved weight shift in the LLE, with instruction from PT to increase erect posture with facilitation at anterior L clavical and axilla. Throughout session, pt requires frequent redirection, but no knee instability noted with  ambulation, despite distractions. Will continue to benefit from PT, but due to Chronicity of pts stroke has resulted in slower progress, but progress is still being met.     OBJECTIVE IMPAIRMENTS: Decreased knowledge of condition, decreased use of DME, decreased mobility, difficulty walking, decreased strength, decreased ROM. ACTIVITY LIMITATIONS: Lifting, standing, walking, squatting, transfers, locomotion level PARTICIPATION LIMITATIONS: Cleaning, laundry, interpersonal relationships, driving, yardwork, community activity.  PERSONAL FACTORS: Age, behavior pattern, education, past/current experiences, transportation, profession  are also affecting patient's functional outcome.  REHAB POTENTIAL: Good CLINICAL DECISION MAKING: Medium  EVALUATION COMPLEXITY: Moderate    PLAN:  PT FREQUENCY: 1-2x/week  PT DURATION: 8 weeks  PLANNED INTERVENTIONS: 97110-Therapeutic exercises, 97530- Therapeutic activity, [id number]- Neuromuscular re-education, 97535- Self Care, 02859- Manual therapy, [phone]- Gait training, [phone]- Orthotic Initial, Patient/Family education, Balance training, and Stair training  PLAN FOR NEXT SESSION:  Transfers and gait training   Patient requested fall recovery training in near future. Sit to stands with decrease right UE support   Massie FORBES Dollar PT  Physical Therapist- [place]  [facility]

## 2024-05-15 ENCOUNTER — Ambulatory Visit: Admitting: Physical Therapy

## 2024-05-24 ENCOUNTER — Ambulatory Visit: Admitting: Physical Therapy

## 2024-05-24 DIAGNOSIS — R2681 Unsteadiness on feet: Secondary | ICD-10-CM

## 2024-05-24 DIAGNOSIS — R262 Difficulty in walking, not elsewhere classified: Secondary | ICD-10-CM

## 2024-05-24 DIAGNOSIS — M6281 Muscle weakness (generalized): Secondary | ICD-10-CM | POA: Diagnosis not present

## 2024-05-24 DIAGNOSIS — R269 Unspecified abnormalities of gait and mobility: Secondary | ICD-10-CM

## 2024-05-24 NOTE — Therapy (Signed)
 " OUTPATIENT PHYSICAL THERAPY TREATMENT  Patient Name: Jacob [name] MRN: [id number] DOB:[birth year], 48 y.o., male Today's Date: 05/24/2024  Dates of reporting period 02/29/2024  to 05/04/2024   PCP: [name], MD (Inactive) REFERRING PROVIDER: [name], MD   PT End of Session - 05/24/24 1126     Visit Number 12    Number of Visits 16    Date for Recertification  06/29/24    Authorization Type [employer]    Authorization Time Period 11/17-12/31    Authorization - Number of Visits 8    Progress Note Due on Visit 20    PT Start Time 0943    PT Stop Time 1015    PT Time Calculation (min) 32 min    Equipment Utilized During Treatment Gait belt    Activity Tolerance Patient tolerated treatment well    Behavior During Therapy [facility] for tasks assessed/performed            Past Medical History:  Diagnosis Date   Allergy    Depression    Diabetes mellitus without complication (HCC)    GERD (gastroesophageal reflux disease)    Hypertension    Paralysis (HCC)    Seizures (HCC)    Stroke [facility])    Past Surgical History:  Procedure Laterality Date   APPENDECTOMY     BUBBLE STUDY  10/02/2021   Procedure: BUBBLE STUDY;  Surgeon: Okey Vina GAILS, MD;  Location: [facility] ENDOSCOPY;  Service: Cardiovascular;;   CRANIOPLASTY Right 02/09/2022   Procedure: Craniectomy with Replacement of Bone Flap From the Abdomen;  Surgeon: [name], MD;  Location: [facility] OR;  Service: Neurosurgery;  Laterality: Right;   CRANIOTOMY Right 09/15/2021   Procedure: RIGHT DECOMPRESSIVE CRANIOTOMY, PLACEMENT OF BONE FLAP IN ABDOMEN;  Surgeon: [name], MD;  Location: [facility] OR;  Service: Neurosurgery;  Laterality: Right;   IR CT HEAD LTD  09/12/2021   IR PERCUTANEOUS ART THROMBECTOMY/INFUSION INTRACRANIAL INC DIAG ANGIO  09/12/2021   IR US  GUIDE VASC ACCESS RIGHT  09/12/2021   LAPAROSCOPIC APPENDECTOMY  10/24/2013   Procedure: APPENDECTOMY LAPAROSCOPIC;  Surgeon: [name], MD;  Location: [facility] ORS;   Service: General;;   RADIOLOGY WITH ANESTHESIA N/A 09/12/2021   Procedure: IR WITH ANESTHESIA;  Surgeon: Dolphus Carrion, MD;  Location: [facility] OR;  Service: Radiology;  Laterality: N/A;   TEE WITHOUT CARDIOVERSION N/A 10/02/2021   Procedure: TRANSESOPHAGEAL ECHOCARDIOGRAM (TEE);  Surgeon: Okey Vina GAILS, MD;  Location: [facility] ENDOSCOPY;  Service: Cardiovascular;  Laterality: N/A;   Patient Active Problem List   Diagnosis Date Noted   Falls 09/06/2023   Hypercholesterolemia 09/06/2023   Weight loss 07/26/2023   Diabetes mellitus (HCC) 01/21/2023   Hypercholesteremia 01/21/2023   Gait abnormality 10/29/2022   Annual physical exam 09/22/2022   Cerebrovascular disease 09/22/2022   History of DVT (deep vein thrombosis) 09/22/2022   Spastic hemiplegia of left nondominant side as late effect of cerebral infarction (HCC) 09/22/2022   Seizure disorder (HCC) 09/22/2022   BMI 27.0-27.9,adult 09/22/2022   Mild episode of recurrent major depressive disorder 06/15/2022   Status post craniectomy 02/09/2022   Urinary retention 09/18/2021   Vitamin D  deficiency 11/04/2020   Hyperlipidemia 03/28/2018   Diabetes mellitus without complication (HCC) 02/23/2018   Essential hypertension 02/23/2018   ONSET DATE: 2023 REFERRING DIAG: Spastic Hemiplegia  THERAPY DIAG:  Difficulty in walking, not elsewhere classified  Abnormality of gait and mobility  Unsteadiness on feet  Muscle weakness (generalized)  Rationale for Evaluation and Treatment: Rehab  SUBJECTIVE:                                                                                                                                                                                             SUBJECTIVE STATEMENT:  Patient doing well today, no updates reported. Arrives late to scheduled PT no reason provided.   Pt accompanied by: self  PERTINENT HISTORY:    48 y.o. M with history of spastic Left hemiplegia s/p craniectomy. Pt previously in rehab  at [facility], then at [facility] main for much of 2024. Pt reports functional plateau at home, has remained modI to supervision for much of his ADL. Pt uses a power WC for most mobility at present, still trying to practice AMB in home with HW and with PCA ~3x months, <139ft typically. Pt sleeps in adjustable bed, reports at least a couple falls related to bed to/from [facility] transfers. Pt reports he has not been able to work on his strength and mobility as he would like since DC, feels like he is able to work harder and accomplish more in a PT setting, is referred back to continue to progress with all mobility and improve his general safety.  PAIN:  Are you having pain? No  PRECAUTIONS: Fall, restricted LUE   WEIGHT BEARING RESTRICTIONS: Yes NWB LUE  FALLS: Has patient fallen in last 6 months? Yes. Number of falls many  LIVING ENVIRONMENT: Lives with: wife, with PCA coverage Lives in: House/apartment Stairs: No Has following equipment at home: Quad cane large base, Hemi walker, Wheelchair (power), and Ramped entry  PATIENT GOALS: Get stronger, safer, more independent with ADL mobility  OBJECTIVE:  Note: Objective measures were completed at evaluation unless otherwise noted.  SENSATION: *Pt has known anesthesia of LLE of note given intermittent use of carbon fiber AFO  LOWER EXTREMITY MMT:    MMT Right Eval Left Eval  Knee flexion    Knee extension    Ankle dorsiflexion  0/5  Ankle plantarflexion  0/5  Ankle inversion  0/5  Ankle eversion  0/5  (Blank rows = not tested)  TRANSFERS: Pull to stand, step pivot car transfers STS push off for step pivot transfers to/from power WC  GAIT:  RUE HW 3-point gait; has not been using his AFO at home, feel like this is only for when doing PT.   FUNCTIONAL TESTS:   -pull to stand, step pivot transfer from [facility] to plinth, car transfer simulation, minGuardA to supervision -STS transfer c RUE HW x1, then static stance x2 minutes half hands free, half with  RUE support -STS to HW x5, minGuardA 18.1sec  -seated  lateral scoot 1x60ft bilat c AFO donned  -seated RUE reach to floor, then upright sitting x5 -STS from plinth to [facility], then AMB 4x54ft c HW and 3 turns -STS from plinth to [facility], then AMB 53ft,minGuardA                                                                                                                               TREATMENT DATE 04/11/24:   Pt session was cut a little short today due to pt arriving late to scheduled appointment time  Close CGA for all activities Transported mod I in power WC to PT gym.   Total A to don AFO.  Stand pivot with hemiwalker to mat table  Raised height then performed hi reps of STS with focus on LLE weight shift x 5 - occasional assist with LLE weight shift and constant assist keeping LLE in optimal position. Cues for pt to attend to LLE position each stand. - same with all reps below.  BP 116/83 MAP 93 HR 63 after first 5 STS  Raised height then performed hi reps of STS with focus on LLE weight shift x 5 Raised height then performed hi reps of STS with focus on LLE weight shift x 5  Lateral weight shift R and L with HW and tactile cuing from PT for improved knee extension with lateral weigh shift to the L.  Gait with HW to chair to end session CGA  PT assists with putting WC back together (leg rests and knee stability device pt derived at home)    PATIENT EDUCATION: Education details: Should be using AFO at home when mobilizing to improve safety, should also use insole over carbon fiber plate Person educated: Patient and Spouse Education method: collaborative learning, deliberate practice, positive reinforcement, explicit instruction, establish rules. Education comprehension: fair, difficult to convince  HOME EXERCISE PROGRAM: None  GOALS: Goals reviewed with patient? No  SHORT TERM GOALS: Target date: 06/02/2024   Pt to demonstrate 5xSTS ad lib fromstandard height in <14sec without LOB   Baseline: 18sec; 12/11: 18.18 sec right UE maintained on chair for entirty of test   Goal status: IN PROGRESS  2.  Pt to demonstrate ability to perform sustained standing dual tasking with intermittent support without LOB x5 minutes  Baseline: 2 min at eval; 12/11 5 min 40 seconds Goal status: MET  3.  Pt to demonstrate >159ft  Baseline: 12/11: 51 ft Goal status: IN PROGRESS  LONG TERM GOALS: Target date: 06/29/24  30sec chair rise, technique ad lib, >11x without LOB Baseline: 10/28: 6.5 with heavy UE support on the RUE . Was noted not to let go of arm rest on the R side. 12/11: 7 with right UE maintained on chair for entirety of session    Goal status: IN PROGRESS  2.  > 374ft without LOB with HW  Baseline:10/28 56ft with 1 seated rest break, 12/11 51 ft w/ HW,  LOB requiring MOD A from SPT Goal status: IN PROGRESS  3.  Pt to reports consistent use of AFO in house when performing gait training.  Baseline: states that he does not wear it often because he doesn't walk much. 12/9: states he does not walk much in the house and therefor does not need the AFO Goal status: DISCONTINUED until able to ambulate consistently and safely   4.  Pt to report no falls in most recent 4 weeks.  Baseline: falls often, sometimes with bed transfers and without footwear. 12/11: states he may have had 1 fall but cannot remember  Goal status: IN PROGRESS ASSESSMENT:  CLINICAL IMPRESSION:  Pt arrives motivated to participate. PT treatment focused on functional mobility and WB through LLE through STS activity in higher volume. Pt generally lethargic, could be due to not sleeping well or lack of nutrition (no food or water  prior to PT) or general deconditioning. Pt has to be motivated and prodded to participate fully this date. Pt vitals WNL. Pt encouraged to use AFO at home and complete STS as we practiced them her in order to improve LLE use. Pt will continue to benefit from skilled physical  therapy intervention to address impairments, improve QOL, and attain therapy goals.    OBJECTIVE IMPAIRMENTS: Decreased knowledge of condition, decreased use of DME, decreased mobility, difficulty walking, decreased strength, decreased ROM. ACTIVITY LIMITATIONS: Lifting, standing, walking, squatting, transfers, locomotion level PARTICIPATION LIMITATIONS: Cleaning, laundry, interpersonal relationships, driving, yardwork, community activity.  PERSONAL FACTORS: Age, behavior pattern, education, past/current experiences, transportation, profession  are also affecting patient's functional outcome.  REHAB POTENTIAL: Good CLINICAL DECISION MAKING: Medium  EVALUATION COMPLEXITY: Moderate    PLAN:  PT FREQUENCY: 1-2x/week  PT DURATION: 8 weeks  PLANNED INTERVENTIONS: 97110-Therapeutic exercises, 97530- Therapeutic activity, [id number]- Neuromuscular re-education, 97535- Self Care, 02859- Manual therapy, [phone]- Gait training, [phone]- Orthotic Initial, Patient/Family education, Balance training, and Stair training  PLAN FOR NEXT SESSION:   Transfers and gait training   Patient requested fall recovery training in near future. Sit to stands with decrease right UE support   Lonni KATHEE Gainer PT  Physical Therapist- [place]  [facility]       "

## 2024-06-01 ENCOUNTER — Ambulatory Visit: Attending: Internal Medicine | Admitting: Physical Therapy

## 2024-06-01 DIAGNOSIS — R2681 Unsteadiness on feet: Secondary | ICD-10-CM | POA: Insufficient documentation

## 2024-06-01 DIAGNOSIS — R269 Unspecified abnormalities of gait and mobility: Secondary | ICD-10-CM | POA: Insufficient documentation

## 2024-06-01 DIAGNOSIS — M6281 Muscle weakness (generalized): Secondary | ICD-10-CM | POA: Insufficient documentation

## 2024-06-01 DIAGNOSIS — R262 Difficulty in walking, not elsewhere classified: Secondary | ICD-10-CM | POA: Diagnosis present

## 2024-06-01 DIAGNOSIS — R278 Other lack of coordination: Secondary | ICD-10-CM | POA: Insufficient documentation

## 2024-06-01 NOTE — Therapy (Signed)
 " OUTPATIENT PHYSICAL THERAPY TREATMENT  Patient Name: Jacob [name] MRN: [id number] DOB:Mar 09, 1976, 49 y.o., male 66 Date: 06/01/2024  PCP: [name], MD REFERRING PROVIDER: [name], Mayo, MD   PT End of Session - 06/01/24 1318     Visit Number 13    Number of Visits 16    Date for Recertification  06/29/24    Authorization Type [employer]    Authorization Time Period 11/17-12/31    Authorization - Number of Visits 8    Progress Note Due on Visit 20    PT Start Time 1318    PT Stop Time 1400    PT Time Calculation (min) 42 min    Equipment Utilized During Treatment Gait belt    Activity Tolerance Patient tolerated treatment well    Behavior During Therapy WFL for tasks assessed/performed            Past Medical History:  Diagnosis Date   Allergy    Depression    Diabetes mellitus without complication (HCC)    GERD (gastroesophageal reflux disease)    Hypertension    Paralysis (HCC)    Seizures (HCC)    Stroke [facility])    Past Surgical History:  Procedure Laterality Date   APPENDECTOMY     BUBBLE STUDY  10/02/2021   Procedure: BUBBLE STUDY;  Surgeon: Okey Vina GAILS, MD;  Location: [facility] ENDOSCOPY;  Service: Cardiovascular;;   CRANIOPLASTY Right 02/09/2022   Procedure: Craniectomy with Replacement of Bone Flap From the Abdomen;  Surgeon: [name], MD;  Location: [facility] OR;  Service: Neurosurgery;  Laterality: Right;   CRANIOTOMY Right 09/15/2021   Procedure: RIGHT DECOMPRESSIVE CRANIOTOMY, PLACEMENT OF BONE FLAP IN ABDOMEN;  Surgeon: [name], MD;  Location: [facility] OR;  Service: Neurosurgery;  Laterality: Right;   IR CT HEAD LTD  09/12/2021   IR PERCUTANEOUS ART THROMBECTOMY/INFUSION INTRACRANIAL INC DIAG ANGIO  09/12/2021   IR US  GUIDE VASC ACCESS RIGHT  09/12/2021   LAPAROSCOPIC APPENDECTOMY  10/24/2013   Procedure: APPENDECTOMY LAPAROSCOPIC;  Surgeon: [name], MD;  Location: [facility] ORS;  Service: General;;   RADIOLOGY WITH ANESTHESIA N/A 09/12/2021    Procedure: IR WITH ANESTHESIA;  Surgeon: Dolphus Carrion, MD;  Location: [facility] OR;  Service: Radiology;  Laterality: N/A;   TEE WITHOUT CARDIOVERSION N/A 10/02/2021   Procedure: TRANSESOPHAGEAL ECHOCARDIOGRAM (TEE);  Surgeon: Okey Vina GAILS, MD;  Location: [facility] ENDOSCOPY;  Service: Cardiovascular;  Laterality: N/A;   Patient Active Problem List   Diagnosis Date Noted   Falls 09/06/2023   Hypercholesterolemia 09/06/2023   Weight loss 07/26/2023   Diabetes mellitus (HCC) 01/21/2023   Hypercholesteremia 01/21/2023   Gait abnormality 10/29/2022   Annual physical exam 09/22/2022   Cerebrovascular disease 09/22/2022   History of DVT (deep vein thrombosis) 09/22/2022   Spastic hemiplegia of left nondominant side as late effect of cerebral infarction (HCC) 09/22/2022   Seizure disorder (HCC) 09/22/2022   BMI 27.0-27.9,adult 09/22/2022   Mild episode of recurrent major depressive disorder 06/15/2022   Status post craniectomy 02/09/2022   Urinary retention 09/18/2021   Vitamin D  deficiency 11/04/2020   Hyperlipidemia 03/28/2018   Diabetes mellitus without complication (HCC) 02/23/2018   Essential hypertension 02/23/2018   ONSET DATE: 2023 REFERRING DIAG: Spastic Hemiplegia  THERAPY DIAG:  Difficulty in walking, not elsewhere classified  Abnormality of gait and mobility  Unsteadiness on feet  Muscle weakness (generalized)  Other lack of coordination  Rationale for Evaluation and Treatment: Rehab  SUBJECTIVE:  SUBJECTIVE STATEMENT:  Patient doing well today, no updates reported. New caregiver present at PT treatment.   Pt accompanied by: self  PERTINENT HISTORY:    49 y.o. M with history of spastic Left hemiplegia s/p craniectomy. Pt previously in rehab at [facility], then at [facility] main for much of 2024.  Pt reports functional plateau at home, has remained modI to supervision for much of his ADL. Pt uses a power WC for most mobility at present, still trying to practice AMB in home with HW and with PCA ~3x months, <162ft typically. Pt sleeps in adjustable bed, reports at least a couple falls related to bed to/from [facility] transfers. Pt reports he has not been able to work on his strength and mobility as he would like since DC, feels like he is able to work harder and accomplish more in a PT setting, is referred back to continue to progress with all mobility and improve his general safety.  PAIN:  Are you having pain? No  PRECAUTIONS: Fall, restricted LUE   WEIGHT BEARING RESTRICTIONS: Yes NWB LUE  FALLS: Has patient fallen in last 6 months? Yes. Number of falls many  LIVING ENVIRONMENT: Lives with: wife, with PCA coverage Lives in: House/apartment Stairs: No Has following equipment at home: Quad cane large base, Hemi walker, Wheelchair (power), and Ramped entry  PATIENT GOALS: Get stronger, safer, more independent with ADL mobility  OBJECTIVE:  Note: Objective measures were completed at evaluation unless otherwise noted.  SENSATION: *Pt has known anesthesia of LLE of note given intermittent use of carbon fiber AFO  LOWER EXTREMITY MMT:    MMT Right Eval Left Eval  Knee flexion    Knee extension    Ankle dorsiflexion  0/5  Ankle plantarflexion  0/5  Ankle inversion  0/5  Ankle eversion  0/5  (Blank rows = not tested)  TRANSFERS: Pull to stand, step pivot car transfers STS push off for step pivot transfers to/from power WC  GAIT:  RUE HW 3-point gait; has not been using his AFO at home, feel like this is only for when doing PT.   FUNCTIONAL TESTS:   -pull to stand, step pivot transfer from [facility] to plinth, car transfer simulation, minGuardA to supervision -STS transfer c RUE HW x1, then static stance x2 minutes half hands free, half with RUE support -STS to HW x5, minGuardA  18.1sec  -seated lateral scoot 1x58ft bilat c AFO donned  -seated RUE reach to floor, then upright sitting x5 -STS from plinth to [facility], then AMB 4x16ft c HW and 3 turns -STS from plinth to [facility], then AMB 37ft,minGuardA                                                                                                                               TREATMENT DATE 04/11/24:     Close CGA for all activities  Transported mod I in power WC to PT gym.   Total A to  don AFO.  Stand pivot with hemiwalker to don  litegait harness.   Step up to litegait treadmill with assist from harness.   BWSTT  2 min 0.37mph difficulty with knee extension and timing of hip flexion at elevated speeds.  3 min 0.107mph, improved knee extension and step length , but assist required for hip and knee extension in stance after first min  Seated rest break  4 min 0.24mph increased assist from PT with fatigue for posture and knee extension in stance. Significant difficulty with step through with fatigue.  Standing rest break in harness 1 min 0. to total 5 after rest break. Continued issue with knee extension and step through when distracted.   Returned to [facility] in litegait harness. Stand to sit with CGA and UE support on handle and arm rest.   Removed harness sitting in WC. Left with caregivers at end of session in power WC.     PATIENT EDUCATION: Education details: Should be using AFO at home when mobilizing to improve safety, should also use insole over carbon fiber plate Person educated: Patient and Spouse Education method: collaborative learning, deliberate practice, positive reinforcement, explicit instruction, establish rules. Education comprehension: fair, difficult to convince  HOME EXERCISE PROGRAM: None  GOALS: Goals reviewed with patient? No  SHORT TERM GOALS: Target date: 06/02/2024   Pt to demonstrate 5xSTS ad lib fromstandard height in <14sec without LOB  Baseline: 18sec; 12/11: 18.18 sec right UE  maintained on chair for entirty of test   Goal status: IN PROGRESS  2.  Pt to demonstrate ability to perform sustained standing dual tasking with intermittent support without LOB x5 minutes  Baseline: 2 min at eval; 12/11 5 min 40 seconds Goal status: MET  3.  Pt to demonstrate >148ft  Baseline: 12/11: 51 ft Goal status: IN PROGRESS  LONG TERM GOALS: Target date: 06/29/24  30sec chair rise, technique ad lib, >11x without LOB Baseline: 10/28: 6.5 with heavy UE support on the RUE . Was noted not to let go of arm rest on the R side. 12/11: 7 with right UE maintained on chair for entirety of session    Goal status: IN PROGRESS  2.  > 369ft without LOB with HW  Baseline:10/28 71ft with 1 seated rest break, 12/11 51 ft w/ HW,  LOB requiring MOD A from SPT Goal status: IN PROGRESS  3.  Pt to reports consistent use of AFO in house when performing gait training.  Baseline: states that he does not wear it often because he doesn't walk much. 12/9: states he does not walk much in the house and therefor does not need the AFO Goal status: DISCONTINUED until able to ambulate consistently and safely   4.  Pt to report no falls in most recent 4 weeks.  Baseline: falls often, sometimes with bed transfers and without footwear. 12/11: states he may have had 1 fall but cannot remember  Goal status: IN PROGRESS ASSESSMENT:  CLINICAL IMPRESSION:  Pt arrives motivated to participate. PT treatment focused on BWSTT to improve tolerance to WB in the LLE as well as increased cardiovascular demands and generalized mobility. Tolerated 0.5 mph with ability to WB through LLE in stances with mod assist from PT to improved knee and hip extension with distraction and fatigue. Pt will continue to benefit from skilled physical therapy intervention to address impairments, improve QOL, and attain therapy goals.    OBJECTIVE IMPAIRMENTS: Decreased knowledge of condition, decreased use of DME, decreased mobility,  difficulty  walking, decreased strength, decreased ROM. ACTIVITY LIMITATIONS: Lifting, standing, walking, squatting, transfers, locomotion level PARTICIPATION LIMITATIONS: Cleaning, laundry, interpersonal relationships, driving, yardwork, community activity.  PERSONAL FACTORS: Age, behavior pattern, education, past/current experiences, transportation, profession  are also affecting patient's functional outcome.  REHAB POTENTIAL: Good CLINICAL DECISION MAKING: Medium  EVALUATION COMPLEXITY: Moderate    PLAN:  PT FREQUENCY: 1-2x/week  PT DURATION: 8 weeks  PLANNED INTERVENTIONS: 97110-Therapeutic exercises, 97530- Therapeutic activity, [id number]- Neuromuscular re-education, 97535- Self Care, 02859- Manual therapy, [phone]- Gait training, [id number]- Orthotic Initial, Patient/Family education, Balance training, and Stair training  PLAN FOR NEXT SESSION:   Transfers and gait training   Patient requested fall recovery training in near future. Sit to stands with decrease right UE support   Massie FORBES Dollar PT  Physical Therapist- [place]  [facility]       "

## 2024-06-08 ENCOUNTER — Ambulatory Visit: Admitting: Physical Therapy

## 2024-06-08 DIAGNOSIS — R278 Other lack of coordination: Secondary | ICD-10-CM

## 2024-06-08 DIAGNOSIS — R262 Difficulty in walking, not elsewhere classified: Secondary | ICD-10-CM | POA: Diagnosis not present

## 2024-06-08 DIAGNOSIS — M6281 Muscle weakness (generalized): Secondary | ICD-10-CM

## 2024-06-08 DIAGNOSIS — R2681 Unsteadiness on feet: Secondary | ICD-10-CM

## 2024-06-08 DIAGNOSIS — R269 Unspecified abnormalities of gait and mobility: Secondary | ICD-10-CM

## 2024-06-08 NOTE — Therapy (Signed)
 " OUTPATIENT PHYSICAL THERAPY TREATMENT  Patient Name: Jacob [name] MRN: [id number] DOB:March 31, 1976, 49 y.o., male 60 Date: 06/08/2024  PCP: [name], MD REFERRING PROVIDER: [name], Mayo, MD   PT End of Session - 06/08/24 1155     Visit Number 14    Number of Visits 16    Date for Recertification  06/29/24    Authorization Type [employer]    Authorization Time Period 1/8-4/8    Authorization - Visit Number 2    Authorization - Number of Visits 16    Progress Note Due on Visit 20    PT Start Time 1155    PT Stop Time 1230    PT Time Calculation (min) 35 min    Equipment Utilized During Treatment Gait belt    Activity Tolerance Patient tolerated treatment well    Behavior During Therapy WFL for tasks assessed/performed            Past Medical History:  Diagnosis Date   Allergy    Depression    Diabetes mellitus without complication (HCC)    GERD (gastroesophageal reflux disease)    Hypertension    Paralysis (HCC)    Seizures (HCC)    Stroke [facility])    Past Surgical History:  Procedure Laterality Date   APPENDECTOMY     BUBBLE STUDY  10/02/2021   Procedure: BUBBLE STUDY;  Surgeon: Okey Vina GAILS, MD;  Location: [facility] ENDOSCOPY;  Service: Cardiovascular;;   CRANIOPLASTY Right 02/09/2022   Procedure: Craniectomy with Replacement of Bone Flap From the Abdomen;  Surgeon: [name], MD;  Location: [facility] OR;  Service: Neurosurgery;  Laterality: Right;   CRANIOTOMY Right 09/15/2021   Procedure: RIGHT DECOMPRESSIVE CRANIOTOMY, PLACEMENT OF BONE FLAP IN ABDOMEN;  Surgeon: [name], MD;  Location: [facility] OR;  Service: Neurosurgery;  Laterality: Right;   IR CT HEAD LTD  09/12/2021   IR PERCUTANEOUS ART THROMBECTOMY/INFUSION INTRACRANIAL INC DIAG ANGIO  09/12/2021   IR US  GUIDE VASC ACCESS RIGHT  09/12/2021   LAPAROSCOPIC APPENDECTOMY  10/24/2013   Procedure: APPENDECTOMY LAPAROSCOPIC;  Surgeon: [name], MD;  Location: [facility] ORS;  Service: General;;   RADIOLOGY WITH  ANESTHESIA N/A 09/12/2021   Procedure: IR WITH ANESTHESIA;  Surgeon: Dolphus Carrion, MD;  Location: [facility] OR;  Service: Radiology;  Laterality: N/A;   TEE WITHOUT CARDIOVERSION N/A 10/02/2021   Procedure: TRANSESOPHAGEAL ECHOCARDIOGRAM (TEE);  Surgeon: Okey Vina GAILS, MD;  Location: [facility] ENDOSCOPY;  Service: Cardiovascular;  Laterality: N/A;   Patient Active Problem List   Diagnosis Date Noted   Falls 09/06/2023   Hypercholesterolemia 09/06/2023   Weight loss 07/26/2023   Diabetes mellitus (HCC) 01/21/2023   Hypercholesteremia 01/21/2023   Gait abnormality 10/29/2022   Annual physical exam 09/22/2022   Cerebrovascular disease 09/22/2022   History of DVT (deep vein thrombosis) 09/22/2022   Spastic hemiplegia of left nondominant side as late effect of cerebral infarction (HCC) 09/22/2022   Seizure disorder (HCC) 09/22/2022   BMI 27.0-27.9,adult 09/22/2022   Mild episode of recurrent major depressive disorder 06/15/2022   Status post craniectomy 02/09/2022   Urinary retention 09/18/2021   Vitamin D  deficiency 11/04/2020   Hyperlipidemia 03/28/2018   Diabetes mellitus without complication (HCC) 02/23/2018   Essential hypertension 02/23/2018   ONSET DATE: 2023 REFERRING DIAG: Spastic Hemiplegia  THERAPY DIAG:  Difficulty in walking, not elsewhere classified  Abnormality of gait and mobility  Unsteadiness on feet  Muscle weakness (generalized)  Other lack of coordination  Rationale for Evaluation and Treatment:  Rehab  SUBJECTIVE:                                                                                                                                                                                             SUBJECTIVE STATEMENT:  Arrives late to PT treatment, no reason provided. Patient doing well today, no updates reported. SO present throughout session.   Pt accompanied by: self  PERTINENT HISTORY:    49 y.o. M with history of spastic Left hemiplegia s/p  craniectomy. Pt previously in rehab at [facility], then at [facility] main for much of 2024. Pt reports functional plateau at home, has remained modI to supervision for much of his ADL. Pt uses a power WC for most mobility at present, still trying to practice AMB in home with HW and with PCA ~3x months, <122ft typically. Pt sleeps in adjustable bed, reports at least a couple falls related to bed to/from [facility] transfers. Pt reports he has not been able to work on his strength and mobility as he would like since DC, feels like he is able to work harder and accomplish more in a PT setting, is referred back to continue to progress with all mobility and improve his general safety.  PAIN:  Are you having pain? No  PRECAUTIONS: Fall, restricted LUE   WEIGHT BEARING RESTRICTIONS: Yes NWB LUE  FALLS: Has patient fallen in last 6 months? Yes. Number of falls many  LIVING ENVIRONMENT: Lives with: wife, with PCA coverage Lives in: House/apartment Stairs: No Has following equipment at home: Quad cane large base, Hemi walker, Wheelchair (power), and Ramped entry  PATIENT GOALS: Get stronger, safer, more independent with ADL mobility  OBJECTIVE:  Note: Objective measures were completed at evaluation unless otherwise noted.  SENSATION: *Pt has known anesthesia of LLE of note given intermittent use of carbon fiber AFO  LOWER EXTREMITY MMT:    MMT Right Eval Left Eval  Knee flexion    Knee extension    Ankle dorsiflexion  0/5  Ankle plantarflexion  0/5  Ankle inversion  0/5  Ankle eversion  0/5  (Blank rows = not tested)  TRANSFERS: Pull to stand, step pivot car transfers STS push off for step pivot transfers to/from power WC  GAIT:  RUE HW 3-point gait; has not been using his AFO at home, feel like this is only for when doing PT.   FUNCTIONAL TESTS:   -pull to stand, step pivot transfer from [facility] to plinth, car transfer simulation, minGuardA to supervision -STS transfer c RUE HW x1, then static stance  x2 minutes half hands free, half with RUE support -STS to  HW x5, minGuardA 18.1sec  -seated lateral scoot 1x74ft bilat c AFO donned  -seated RUE reach to floor, then upright sitting x5 -STS from plinth to [facility], then AMB 4x18ft c HW and 3 turns -STS from plinth to [facility], then AMB 61ft,minGuardA                                                                                                                               TREATMENT DATE 04/11/24:     PT assisted with donning AFO on the LLE with total assist.   Sit>stand at power WC tactile cues to improve WB through the LLE.   Ambulatory transport to rail on wall x 78ft with HW.   Side stepping at rail 16ft x 2 bil.  Reverse stepping 58ft to power WC; UE supported on HW.  Forward/reverse gait with HW 49ft x 2.  Gait x 23ft with 180deg turn at 48ft.  Gait with HW x 65ft to OT gym.   Throughout all standing interventions, PT provided CGA to min assist to improve posture and reduced Lateral/anterior lean to the L. Instruction from PT to perform LLE advancement prior to advancing HW due to improved posture, weight shift, and step length with this sequence. Pt able to maintain this sequence 50% of steps, than reverting to self selected movement pattern of HW, LLE then RLE. Increased lateral/anterior L LOB with pt's self selected movement pattern due to excessive step through on the RLE.        PATIENT EDUCATION: Education details: Should be using AFO at home when mobilizing to improve safety, should also use insole over carbon fiber plate Person educated: Patient and Spouse Education method: collaborative learning, deliberate practice, positive reinforcement, explicit instruction, establish rules. Education comprehension: fair, difficult to convince  HOME EXERCISE PROGRAM: None  GOALS: Goals reviewed with patient? No  SHORT TERM GOALS: Target date: 06/02/2024   Pt to demonstrate 5xSTS ad lib fromstandard height in <14sec without LOB  Baseline:  18sec; 12/11: 18.18 sec right UE maintained on chair for entirty of test   Goal status: IN PROGRESS  2.  Pt to demonstrate ability to perform sustained standing dual tasking with intermittent support without LOB x5 minutes  Baseline: 2 min at eval; 12/11 5 min 40 seconds Goal status: MET  3.  Pt to demonstrate >114ft  Baseline: 12/11: 51 ft Goal status: IN PROGRESS  LONG TERM GOALS: Target date: 06/29/24  30sec chair rise, technique ad lib, >11x without LOB Baseline: 10/28: 6.5 with heavy UE support on the RUE . Was noted not to let go of arm rest on the R side. 12/11: 7 with right UE maintained on chair for entirety of session    Goal status: IN PROGRESS  2.  > 356ft without LOB with HW  Baseline:10/28 55ft with 1 seated rest break, 12/11 51 ft w/ HW,  LOB requiring MOD A from SPT Goal status: IN PROGRESS  3.  Pt to reports consistent use of AFO in house when performing gait training.  Baseline: states that he does not wear it often because he doesn't walk much. 12/9: states he does not walk much in the house and therefor does not need the AFO Goal status: DISCONTINUED until able to ambulate consistently and safely   4.  Pt to report no falls in most recent 4 weeks.  Baseline: falls often, sometimes with bed transfers and without footwear. 12/11: states he may have had 1 fall but cannot remember  Goal status: IN PROGRESS ASSESSMENT:  CLINICAL IMPRESSION:  Pt arrives motivated to participate. PT treatment focused on overground mobility. Noted to have improved posture and sequencing of gait pattern in turns compared to prior session resulting in reduced lateral L LOB, as well as improved WB through the LLE through variable gait training. Best sequence of gait with HW to perform step with LLE, advance HW then step RLE.  Pt will continue to benefit from skilled physical therapy intervention to address impairments, improve QOL, and attain therapy goals.    OBJECTIVE  IMPAIRMENTS: Decreased knowledge of condition, decreased use of DME, decreased mobility, difficulty walking, decreased strength, decreased ROM. ACTIVITY LIMITATIONS: Lifting, standing, walking, squatting, transfers, locomotion level PARTICIPATION LIMITATIONS: Cleaning, laundry, interpersonal relationships, driving, yardwork, community activity.  PERSONAL FACTORS: Age, behavior pattern, education, past/current experiences, transportation, profession  are also affecting patient's functional outcome.  REHAB POTENTIAL: Good CLINICAL DECISION MAKING: Medium  EVALUATION COMPLEXITY: Moderate    PLAN:  PT FREQUENCY: 1-2x/week  PT DURATION: 8 weeks  PLANNED INTERVENTIONS: 97110-Therapeutic exercises, 97530- Therapeutic activity, [id number]- Neuromuscular re-education, 97535- Self Care, [id number]- Manual therapy, [phone]- Gait training, [phone]- Orthotic Initial, Patient/Family education, Balance training, and Stair training  PLAN FOR NEXT SESSION:   Transfers and gait training   BWSTT if time allows.  Patient requested fall recovery training in near future. Sit to stands with decrease right UE support   Massie FORBES Dollar PT  Physical Therapist- [place]  [facility]       "

## 2024-06-15 ENCOUNTER — Ambulatory Visit: Admitting: Physical Therapy

## 2024-06-15 NOTE — Therapy (Incomplete)
 " OUTPATIENT PHYSICAL THERAPY TREATMENT  Patient Name: Jacob [name] MRN: [id number] DOB:16-Sep-1975, 49 y.o., male 32 Date: 06/15/2024  PCP: Stephen Rojelio PARAS, NP REFERRING PROVIDER: [name], MD       Past Medical History:  Diagnosis Date   Allergy    Depression    Diabetes mellitus without complication (HCC)    GERD (gastroesophageal reflux disease)    Hypertension    Paralysis (HCC)    Seizures (HCC)    Stroke [facility])    Past Surgical History:  Procedure Laterality Date   APPENDECTOMY     BUBBLE STUDY  10/02/2021   Procedure: BUBBLE STUDY;  Surgeon: Okey Vina GAILS, MD;  Location: [facility] ENDOSCOPY;  Service: Cardiovascular;;   CRANIOPLASTY Right 02/09/2022   Procedure: Craniectomy with Replacement of Bone Flap From the Abdomen;  Surgeon: [name], MD;  Location: [facility] OR;  Service: Neurosurgery;  Laterality: Right;   CRANIOTOMY Right 09/15/2021   Procedure: RIGHT DECOMPRESSIVE CRANIOTOMY, PLACEMENT OF BONE FLAP IN ABDOMEN;  Surgeon: [name], MD;  Location: [facility] OR;  Service: Neurosurgery;  Laterality: Right;   IR CT HEAD LTD  09/12/2021   IR PERCUTANEOUS ART THROMBECTOMY/INFUSION INTRACRANIAL INC DIAG ANGIO  09/12/2021   IR US  GUIDE VASC ACCESS RIGHT  09/12/2021   LAPAROSCOPIC APPENDECTOMY  10/24/2013   Procedure: APPENDECTOMY LAPAROSCOPIC;  Surgeon: [name], MD;  Location: [facility] ORS;  Service: General;;   RADIOLOGY WITH ANESTHESIA N/A 09/12/2021   Procedure: IR WITH ANESTHESIA;  Surgeon: Dolphus Carrion, MD;  Location: [facility] OR;  Service: Radiology;  Laterality: N/A;   TEE WITHOUT CARDIOVERSION N/A 10/02/2021   Procedure: TRANSESOPHAGEAL ECHOCARDIOGRAM (TEE);  Surgeon: Okey Vina GAILS, MD;  Location: [facility] ENDOSCOPY;  Service: Cardiovascular;  Laterality: N/A;   Patient Active Problem List   Diagnosis Date Noted   Falls 09/06/2023   Hypercholesterolemia 09/06/2023   Weight loss 07/26/2023   Diabetes mellitus (HCC) 01/21/2023   Hypercholesteremia 01/21/2023    Gait abnormality 10/29/2022   Annual physical exam 09/22/2022   Cerebrovascular disease 09/22/2022   History of DVT (deep vein thrombosis) 09/22/2022   Spastic hemiplegia of left nondominant side as late effect of cerebral infarction (HCC) 09/22/2022   Seizure disorder (HCC) 09/22/2022   BMI 27.0-27.9,adult 09/22/2022   Mild episode of recurrent major depressive disorder 06/15/2022   Status post craniectomy 02/09/2022   Urinary retention 09/18/2021   Vitamin D  deficiency 11/04/2020   Hyperlipidemia 03/28/2018   Diabetes mellitus without complication (HCC) 02/23/2018   Essential hypertension 02/23/2018   ONSET DATE: 2023 REFERRING DIAG: Spastic Hemiplegia  THERAPY DIAG:  Difficulty in walking, not elsewhere classified  Abnormality of gait and mobility  Unsteadiness on feet  Muscle weakness (generalized)  Rationale for Evaluation and Treatment: Rehab  SUBJECTIVE:  SUBJECTIVE STATEMENT:  Arrives late to PT treatment, no reason provided. Patient doing well today, no updates reported. SO present throughout session.   Pt accompanied by: self  PERTINENT HISTORY:    49 y.o. M with history of spastic Left hemiplegia s/p craniectomy. Pt previously in rehab at [facility], then at [facility] main for much of 2024. Pt reports functional plateau at home, has remained modI to supervision for much of his ADL. Pt uses a power WC for most mobility at present, still trying to practice AMB in home with HW and with PCA ~3x months, <169ft typically. Pt sleeps in adjustable bed, reports at least a couple falls related to bed to/from [facility] transfers. Pt reports he has not been able to work on his strength and mobility as he would like since DC, feels like he is able to work harder and accomplish more in a PT setting, is referred  back to continue to progress with all mobility and improve his general safety.  PAIN:  Are you having pain? No  PRECAUTIONS: Fall, restricted LUE   WEIGHT BEARING RESTRICTIONS: Yes NWB LUE  FALLS: Has patient fallen in last 6 months? Yes. Number of falls many  LIVING ENVIRONMENT: Lives with: wife, with PCA coverage Lives in: House/apartment Stairs: No Has following equipment at home: Quad cane large base, Hemi walker, Wheelchair (power), and Ramped entry  PATIENT GOALS: Get stronger, safer, more independent with ADL mobility  OBJECTIVE:  Note: Objective measures were completed at evaluation unless otherwise noted.  SENSATION: *Pt has known anesthesia of LLE of note given intermittent use of carbon fiber AFO  LOWER EXTREMITY MMT:    MMT Right Eval Left Eval  Knee flexion    Knee extension    Ankle dorsiflexion  0/5  Ankle plantarflexion  0/5  Ankle inversion  0/5  Ankle eversion  0/5  (Blank rows = not tested)  TRANSFERS: Pull to stand, step pivot car transfers STS push off for step pivot transfers to/from power WC  GAIT:  RUE HW 3-point gait; has not been using his AFO at home, feel like this is only for when doing PT.   FUNCTIONAL TESTS:   -pull to stand, step pivot transfer from [facility] to plinth, car transfer simulation, minGuardA to supervision -STS transfer c RUE HW x1, then static stance x2 minutes half hands free, half with RUE support -STS to HW x5, minGuardA 18.1sec  -seated lateral scoot 1x40ft bilat c AFO donned  -seated RUE reach to floor, then upright sitting x5 -STS from plinth to [facility], then AMB 4x21ft c HW and 3 turns -STS from plinth to [facility], then AMB 3ft,minGuardA                                                                                                                               TREATMENT DATE 04/11/24:     PT assisted with donning AFO on the LLE with total assist.   STS with  Hemiwalker while donning lite gait harness with chair post Donned  lite gait harness - LiteGait harness applied with patient in standing position. Straps secured snugly around torso and legs per manufacturer guidelines. Checked for proper alignment, comfort, and safety prior to standing and gait training. All buckles and fasteners verified secure; weight-bearing support adjusted to patient tolerance        PATIENT EDUCATION: Education details: Should be using AFO at home when mobilizing to improve safety, should also use insole over carbon fiber plate Person educated: Patient and Spouse Education method: collaborative learning, deliberate practice, positive reinforcement, explicit instruction, establish rules. Education comprehension: fair, difficult to convince  HOME EXERCISE PROGRAM: None  GOALS: Goals reviewed with patient? No  SHORT TERM GOALS: Target date: 06/02/2024   Pt to demonstrate 5xSTS ad lib fromstandard height in <14sec without LOB  Baseline: 18sec; 12/11: 18.18 sec right UE maintained on chair for entirty of test   Goal status: IN PROGRESS  2.  Pt to demonstrate ability to perform sustained standing dual tasking with intermittent support without LOB x5 minutes  Baseline: 2 min at eval; 12/11 5 min 40 seconds Goal status: MET  3.  Pt to demonstrate >155ft  Baseline: 12/11: 51 ft Goal status: IN PROGRESS  LONG TERM GOALS: Target date: 06/29/24  30sec chair rise, technique ad lib, >11x without LOB Baseline: 10/28: 6.5 with heavy UE support on the RUE . Was noted not to let go of arm rest on the R side. 12/11: 7 with right UE maintained on chair for entirety of session    Goal status: IN PROGRESS  2.  > 335ft without LOB with HW  Baseline:10/28 38ft with 1 seated rest break, 12/11 51 ft w/ HW,  LOB requiring MOD A from SPT Goal status: IN PROGRESS  3.  Pt to reports consistent use of AFO in house when performing gait training.  Baseline: states that he does not wear it often because he doesn't walk much. 12/9: states he  does not walk much in the house and therefor does not need the AFO Goal status: DISCONTINUED until able to ambulate consistently and safely   4.  Pt to report no falls in most recent 4 weeks.  Baseline: falls often, sometimes with bed transfers and without footwear. 12/11: states he may have had 1 fall but cannot remember  Goal status: IN PROGRESS ASSESSMENT:  CLINICAL IMPRESSION:  Pt arrives motivated to participate. PT treatment focused on overground mobility. Noted to have improved posture and sequencing of gait pattern in turns compared to prior session resulting in reduced lateral L LOB, as well as improved WB through the LLE through variable gait training. Best sequence of gait with HW to perform step with LLE, advance HW then step RLE.  Pt will continue to benefit from skilled physical therapy intervention to address impairments, improve QOL, and attain therapy goals.    OBJECTIVE IMPAIRMENTS: Decreased knowledge of condition, decreased use of DME, decreased mobility, difficulty walking, decreased strength, decreased ROM. ACTIVITY LIMITATIONS: Lifting, standing, walking, squatting, transfers, locomotion level PARTICIPATION LIMITATIONS: Cleaning, laundry, interpersonal relationships, driving, yardwork, community activity.  PERSONAL FACTORS: Age, behavior pattern, education, past/current experiences, transportation, profession  are also affecting patient's functional outcome.  REHAB POTENTIAL: Good CLINICAL DECISION MAKING: Medium  EVALUATION COMPLEXITY: Moderate    PLAN:  PT FREQUENCY: 1-2x/week  PT DURATION: 8 weeks  PLANNED INTERVENTIONS: 97110-Therapeutic exercises, 97530- Therapeutic activity, [id number]- Neuromuscular re-education, 97535- Self Care, 02859- Manual therapy, [id number]- Gait training, [phone]-  Orthotic Initial, Patient/Family education, Balance training, and Stair training  PLAN FOR NEXT SESSION:   Transfers and gait training   BWSTT if time allows.  Patient requested  fall recovery training in near future. Sit to stands with decrease right UE support   Lonni KATHEE Gainer PT  Physical Therapist-   [facility]       "

## 2024-06-26 ENCOUNTER — Encounter: Payer: Self-pay | Admitting: Gastroenterology

## 2024-06-27 ENCOUNTER — Ambulatory Visit: Admitting: Podiatry

## 2024-06-29 ENCOUNTER — Ambulatory Visit: Attending: Internal Medicine | Admitting: Physical Therapy

## 2024-06-29 DIAGNOSIS — M6281 Muscle weakness (generalized): Secondary | ICD-10-CM

## 2024-06-29 DIAGNOSIS — R262 Difficulty in walking, not elsewhere classified: Secondary | ICD-10-CM

## 2024-06-29 DIAGNOSIS — R269 Unspecified abnormalities of gait and mobility: Secondary | ICD-10-CM

## 2024-06-29 DIAGNOSIS — R2681 Unsteadiness on feet: Secondary | ICD-10-CM

## 2024-06-29 NOTE — Progress Notes (Shared)
 " [facility] - Clinic Note  07/12/2024   CHIEF COMPLAINT Patient presents for No chief complaint on file.  HISTORY OF PRESENT ILLNESS: Jacob [name] is a 49 y.o. male who presents to the clinic today for:   Referring physician: [name], MD [address] [place],  [place] [address]  HISTORICAL INFORMATION:  Selected notes from the MEDICAL RECORD NUMBER Referred by Dr. JAMA:  Ocular Hx- PMH-   CURRENT MEDICATIONS: No current outpatient medications on file. (Ophthalmic Drugs)   No current facility-administered medications for this visit. (Ophthalmic Drugs)   Current Outpatient Medications (Other)  Medication Sig   ACCU-CHEK GUIDE TEST test strip USE AS INSTRUCTED   Accu-Chek Softclix Lancets lancets TEST IN THE AM AND BEFORE MEALS   aspirin  EC 81 MG tablet Take 1 tablet (81 mg total) by mouth daily. Swallow whole.   atorvastatin  (LIPITOR) 40 MG tablet Take 2 tablets (80 mg total) by mouth at bedtime.   baclofen  (LIORESAL ) 10 MG tablet TAKE 1 TABLET BY MOUTH THREE TIMES A DAY   blood glucose meter kit and supplies KIT Dispense based on patient and insurance preference. Use up to four times daily as directed.   Brivaracetam  (BRIVIACT ) 50 MG TABS TAKE 50 MG BY MOUTH IN THE MORNING AND AT BEDTIME.   cloNIDine  (CATAPRES  - DOSED IN MG/24 HR) 0.3 mg/24hr patch Place 1 patch (0.3 mg total) onto the skin once a week.   divalproex  (DEPAKOTE  ER) 500 MG 24 hr tablet Take 1 tablet (500 mg total) by mouth daily.   escitalopram  (LEXAPRO ) 20 MG tablet TAKE 1 TABLET BY MOUTH EVERY DAY   insulin  glargine (LANTUS ) 100 UNIT/ML injection Inject 0.1 mLs (10 Units total) into the skin daily.   insulin  glargine (LANTUS ) 100 UNIT/ML Solostar Pen Inject 10 Units into the skin daily.   insulin  lispro (HUMALOG  KWIKPEN) 100 UNIT/ML KwikPen INJECT 0-20 UNITS INTO THE SKIN 3 (THREE) TIMES DAILY. INJECT 0-20 UNITS INTO THE SKIN 3 (THREE) TIMES DAILY WITH MEALS. CBG < 70 CALL MD.  CBG 70-120 GIVE 0 UNITS 121-150 GIVE 3 UNITS 151-200 GIVE 4 UNITS, 201-250 GIVE 7 UNITS 251-300 GIVE 11 UNITS 301-350 GIVE 15 UNITS 351-400 GIVE 20 UNITS GREATER THAN 400 CALL MD   lisinopril  (ZESTRIL ) 40 MG tablet TAKE 1 TABLET BY MOUTH EVERY DAY   metFORMIN  (GLUCOPHAGE -XR) 500 MG 24 hr tablet Take 1 tablet (500 mg total) by mouth daily with breakfast.   metoprolol  tartrate (LOPRESSOR ) 25 MG tablet TAKE 1 TABLET BY MOUTH TWICE A DAY   sildenafil  (VIAGRA ) 50 MG tablet Take 1 tablet (50 mg total) by mouth daily as needed for erectile dysfunction.   traZODone  (DESYREL ) 50 MG tablet Take 1 tablet (50 mg total) by mouth at bedtime.   No current facility-administered medications for this visit. (Other)   REVIEW OF SYSTEMS:  ALLERGIES Allergies[1] PAST MEDICAL HISTORY Past Medical History:  Diagnosis Date   Allergy    Depression    Diabetes mellitus without complication (HCC)    GERD (gastroesophageal reflux disease)    Hypertension    Paralysis (HCC)    Seizures (HCC)    Stroke [facility])    Past Surgical History:  Procedure Laterality Date   APPENDECTOMY     BUBBLE STUDY  10/02/2021   Procedure: BUBBLE STUDY;  Surgeon: Okey Vina GAILS, MD;  Location: [facility] ENDOSCOPY;  Service: Cardiovascular;;   CRANIOPLASTY Right 02/09/2022   Procedure: Craniectomy with Replacement of Bone Flap  From the Abdomen;  Surgeon: [name], MD;  Location: [facility] OR;  Service: Neurosurgery;  Laterality: Right;   CRANIOTOMY Right 09/15/2021   Procedure: RIGHT DECOMPRESSIVE CRANIOTOMY, PLACEMENT OF BONE FLAP IN ABDOMEN;  Surgeon: [name], MD;  Location: [facility] OR;  Service: Neurosurgery;  Laterality: Right;   IR CT HEAD LTD  09/12/2021   IR PERCUTANEOUS ART THROMBECTOMY/INFUSION INTRACRANIAL INC DIAG ANGIO  09/12/2021   IR US  GUIDE VASC ACCESS RIGHT  09/12/2021   LAPAROSCOPIC APPENDECTOMY  10/24/2013   Procedure: APPENDECTOMY LAPAROSCOPIC;  Surgeon: [name], MD;  Location: [facility] ORS;  Service: General;;    RADIOLOGY WITH ANESTHESIA N/A 09/12/2021   Procedure: IR WITH ANESTHESIA;  Surgeon: Dolphus Carrion, MD;  Location: [facility] OR;  Service: Radiology;  Laterality: N/A;   TEE WITHOUT CARDIOVERSION N/A 10/02/2021   Procedure: TRANSESOPHAGEAL ECHOCARDIOGRAM (TEE);  Surgeon: Okey Vina GAILS, MD;  Location: [facility] ENDOSCOPY;  Service: Cardiovascular;  Laterality: N/A;   FAMILY HISTORY Family History  Problem Relation Age of Onset   Asthma Son    Stroke Neg Hx    Colon cancer Neg Hx    Rectal cancer Neg Hx    Stomach cancer Neg Hx    Esophageal cancer Neg Hx    SOCIAL HISTORY Social History[2]     OPHTHALMIC EXAM:  Not recorded    IMAGING AND PROCEDURES  Imaging and Procedures for 07/12/2024        ASSESSMENT/PLAN: No diagnosis found. 1.  2.  3.  Ophthalmic Meds Ordered this visit:  No orders of the defined types were placed in this encounter.    No follow-ups on file.  There are no Patient Instructions on file for this visit.  Explained the diagnoses, plan, and follow up with the patient and they expressed understanding.  Patient expressed understanding of the importance of proper follow up care.   This document serves as a record of services personally performed by Redell JUDITHANN Hans, MD, PhD. It was created on their behalf by Almetta Pesa, an ophthalmic technician. The creation of this record is the provider's dictation and/or activities during the visit.    Electronically signed by: Almetta Pesa, OA, 06/29/24  1:47 PM  Redell JUDITHANN Hans, M.D., Ph.D. Diseases & Surgery of the Retina and Vitreous [facility] 07/12/2024  Abbreviations: M myopia (nearsighted); A astigmatism; H hyperopia (farsighted); P presbyopia; Mrx spectacle prescription;  CTL contact lenses; OD right eye; OS left eye; OU both eyes  XT exotropia; ET esotropia; PEK punctate epithelial keratitis; PEE punctate epithelial erosions; DES dry eye syndrome; MGD meibomian gland dysfunction;  ATs artificial tears; PFAT's preservative free artificial tears; NSC nuclear sclerotic cataract; PSC posterior subcapsular cataract; ERM epi-retinal membrane; PVD posterior vitreous detachment; RD retinal detachment; DM diabetes mellitus; DR diabetic retinopathy; NPDR non-proliferative diabetic retinopathy; PDR proliferative diabetic retinopathy; CSME clinically significant macular edema; DME diabetic macular edema; dbh dot blot hemorrhages; CWS cotton wool spot; POAG primary open angle glaucoma; C/D cup-to-disc ratio; HVF humphrey visual field; GVF goldmann visual field; OCT optical coherence tomography; IOP intraocular pressure; BRVO Branch retinal vein occlusion; CRVO central retinal vein occlusion; CRAO central retinal artery occlusion; BRAO branch retinal artery occlusion; RT retinal tear; SB scleral buckle; PPV pars plana vitrectomy; VH Vitreous hemorrhage; PRP panretinal laser photocoagulation; IVK intravitreal kenalog; VMT vitreomacular traction; MH Macular hole;  NVD neovascularization of the disc; NVE neovascularization elsewhere; AREDS age related eye disease study; ARMD age related macular degeneration; POAG primary open angle glaucoma; EBMD epithelial/anterior  basement membrane dystrophy; ACIOL anterior chamber intraocular lens; IOL intraocular lens; PCIOL posterior chamber intraocular lens; Phaco/IOL phacoemulsification with intraocular lens placement; PRK photorefractive keratectomy; LASIK laser assisted in situ keratomileusis; HTN hypertension; DM diabetes mellitus; COPD chronic obstructive pulmonary disease     [1]  Allergies Allergen Reactions   Hydrochlorothiazide  Other (See Comments)    Bilateral muscle cramping  [2]  Social History Tobacco Use   Smoking status: Never   Smokeless tobacco: Never  Vaping Use   Vaping status: Never Used  Substance Use Topics   Alcohol use: Yes    Alcohol/week: 1.0 standard drink of alcohol    Types: 1 Cans of beer per week    Comment: OCCASIONALLY    Drug use: Yes    Types: MDMA (Ecstacy)   "

## 2024-06-29 NOTE — Therapy (Signed)
 " OUTPATIENT PHYSICAL THERAPY TREATMENT/ RE-CERTIFICATION NOTE    Patient Name: Jacob [name] MRN: [id number] DOB:1975/11/11, 49 y.o., male 29 Date: 06/29/2024  PCP: Stephen Rojelio PARAS, NP REFERRING PROVIDER: [name], MD   PT End of Session - 06/29/24 1209     Visit Number 15    Number of Visits 16    Date for Recertification  08/30/24    Authorization Type [employer]    Authorization Time Period 1/8-4/8    Authorization - Number of Visits 16    Progress Note Due on Visit 20    PT Start Time 1159    PT Stop Time 1228    PT Time Calculation (min) 29 min    Equipment Utilized During Treatment Gait belt    Activity Tolerance Patient tolerated treatment well    Behavior During Therapy WFL for tasks assessed/performed             Past Medical History:  Diagnosis Date   Allergy    Depression    Diabetes mellitus without complication (HCC)    GERD (gastroesophageal reflux disease)    Hypertension    Paralysis (HCC)    Seizures (HCC)    Stroke [facility])    Past Surgical History:  Procedure Laterality Date   APPENDECTOMY     BUBBLE STUDY  10/02/2021   Procedure: BUBBLE STUDY;  Surgeon: Okey Vina GAILS, MD;  Location: [facility] ENDOSCOPY;  Service: Cardiovascular;;   CRANIOPLASTY Right 02/09/2022   Procedure: Craniectomy with Replacement of Bone Flap From the Abdomen;  Surgeon: [name], MD;  Location[facility] OR;  Service: Neurosurgery;  Laterality: Right;   CRANIOTOMY Right 09/15/2021   Procedure: RIGHT DECOMPRESSIVE CRANIOTOMY, PLACEMENT OF BONE FLAP IN ABDOMEN;  Surgeon: [name], MD;  Location: [facility] OR;  Service: Neurosurgery;  Laterality: Right;   IR CT HEAD LTD  09/12/2021   IR PERCUTANEOUS ART THROMBECTOMY/INFUSION INTRACRANIAL INC DIAG ANGIO  09/12/2021   IR US  GUIDE VASC ACCESS RIGHT  09/12/2021   LAPAROSCOPIC APPENDECTOMY  10/24/2013   Procedure: APPENDECTOMY LAPAROSCOPIC;  Surgeon: [name], MD;  Location: [facility] ORS;  Service: General;;   RADIOLOGY WITH  ANESTHESIA N/A 09/12/2021   Procedure: IR WITH ANESTHESIA;  Surgeon: Dolphus Carrion, MD;  Location: [facility] OR;  Service: Radiology;  Laterality: N/A;   TEE WITHOUT CARDIOVERSION N/A 10/02/2021   Procedure: TRANSESOPHAGEAL ECHOCARDIOGRAM (TEE);  Surgeon: Okey Vina GAILS, MD;  Location: [facility] ENDOSCOPY;  Service: Cardiovascular;  Laterality: N/A;   Patient Active Problem List   Diagnosis Date Noted   Falls 09/06/2023   Hypercholesterolemia 09/06/2023   Weight loss 07/26/2023   Diabetes mellitus (HCC) 01/21/2023   Hypercholesteremia 01/21/2023   Gait abnormality 10/29/2022   Annual physical exam 09/22/2022   Cerebrovascular disease 09/22/2022   History of DVT (deep vein thrombosis) 09/22/2022   Spastic hemiplegia of left nondominant side as late effect of cerebral infarction (HCC) 09/22/2022   Seizure disorder (HCC) 09/22/2022   BMI 27.0-27.9,adult 09/22/2022   Mild episode of recurrent major depressive disorder 06/15/2022   Status post craniectomy 02/09/2022   Urinary retention 09/18/2021   Vitamin D  deficiency 11/04/2020   Hyperlipidemia 03/28/2018   Diabetes mellitus without complication (HCC) 02/23/2018   Essential hypertension 02/23/2018   ONSET DATE: 2023 REFERRING DIAG: Spastic Hemiplegia  THERAPY DIAG:  Difficulty in walking, not elsewhere classified  Abnormality of gait and mobility  Unsteadiness on feet  Muscle weakness (generalized)  Rationale for Evaluation and Treatment: Rehab  SUBJECTIVE:  SUBJECTIVE STATEMENT:  Arrives late to PT treatment, no reason provided. Patient doing well today, no updates reported. Reports he has been walking at home then when questioned if he has walked in past week he replied no. Informed pt of importance of walking when he is in safe environment and has  help to ensure safety. Implied he is unlikely to improve if he does not follow through with exercises after PT.   Pt accompanied by: self  PERTINENT HISTORY:    49 y.o. M with history of spastic Left hemiplegia s/p craniectomy. Pt previously in rehab at [facility], then at [facility] main for much of 2024. Pt reports functional plateau at home, has remained modI to supervision for much of his ADL. Pt uses a power WC for most mobility at present, still trying to practice AMB in home with HW and with PCA ~3x months, <114ft typically. Pt sleeps in adjustable bed, reports at least a couple falls related to bed to/from [facility] transfers. Pt reports he has not been able to work on his strength and mobility as he would like since DC, feels like he is able to work harder and accomplish more in a PT setting, is referred back to continue to progress with all mobility and improve his general safety.  PAIN:  Are you having pain? No  PRECAUTIONS: Fall, restricted LUE   WEIGHT BEARING RESTRICTIONS: Yes NWB LUE  FALLS: Has patient fallen in last 6 months? Yes. Number of falls many  LIVING ENVIRONMENT: Lives with: wife, with PCA coverage Lives in: House/apartment Stairs: No Has following equipment at home: Quad cane large base, Hemi walker, Wheelchair (power), and Ramped entry  PATIENT GOALS: Get stronger, safer, more independent with ADL mobility  OBJECTIVE:  Note: Objective measures were completed at evaluation unless otherwise noted.  SENSATION: *Pt has known anesthesia of LLE of note given intermittent use of carbon fiber AFO  LOWER EXTREMITY MMT:    MMT Right Eval Left Eval  Knee flexion    Knee extension    Ankle dorsiflexion  0/5  Ankle plantarflexion  0/5  Ankle inversion  0/5  Ankle eversion  0/5  (Blank rows = not tested)  TRANSFERS: Pull to stand, step pivot car transfers STS push off for step pivot transfers to/from power WC  GAIT:  RUE HW 3-point gait; has not been using his AFO at home,  feel like this is only for when doing PT.   FUNCTIONAL TESTS:   -pull to stand, step pivot transfer from [facility] to plinth, car transfer simulation, minGuardA to supervision -STS transfer c RUE HW x1, then static stance x2 minutes half hands free, half with RUE support -STS to HW x5, minGuardA 18.1sec  -seated lateral scoot 1x11ft bilat c AFO donned  -seated RUE reach to floor, then upright sitting x5 -STS from plinth to [facility], then AMB 4x5ft c HW and 3 turns -STS from plinth to [facility], then AMB 18ft,minGuardA  TREATMENT DATE 06/29/24:   Pt session was cut short today due to pt arriving late to scheduled appointment time.    TA- To improve functional movements patterns for everyday tasks   10 x STS with mirror ant and cues for L weight shift.   Physical Performance Test or Measurement: a  physical performance test(s) or measurement (eg,  musculoskeletal, functional capacity), with written report,  each 15 mins   30 sec chair stand completes 8 reps but stops to fix pants in middle. Heavy R UE assist throughout and very poor weight bearing through LLE.  Same reps on second round/ attempt and same reliance.   Gait trial for distance: 48 ft with hemiwalker and min A to prevent ant Lob throughout. Pt tends to leave walk post causing imbalance despite cues throughout gait cycle. Stopped due to fatigue.   Unless otherwise stated, CGA was provided and gait belt donned in order to ensure pt safety         PATIENT EDUCATION: Education details: Should be using AFO at home when mobilizing to improve safety, should also use insole over carbon fiber plate Person educated: Patient and Spouse Education method: collaborative learning, deliberate practice, positive reinforcement, explicit instruction, establish rules. Education comprehension: fair, difficult to convince  HOME  EXERCISE PROGRAM: None  GOALS: Goals reviewed with patient? No  SHORT TERM GOALS: Target date: 06/02/2024   Pt to demonstrate 5xSTS ad lib fromstandard height in <14sec without LOB  Baseline: 18sec; 12/11: 18.18 sec right UE maintained on chair for entirty of test   Goal status: IN PROGRESS  2.  Pt to demonstrate ability to perform sustained standing dual tasking with intermittent support without LOB x5 minutes  Baseline: 2 min at eval; 12/11 5 min 40 seconds Goal status: MET  3.  Pt to demonstrate >161ft  Baseline: 12/11: 51 ft Goal status: IN PROGRESS  LONG TERM GOALS: Target date: 08/30/24  30sec chair rise, technique ad lib, >11x without LOB Baseline: 10/28: 6.5 with heavy UE support on the RUE . Was noted not to let go of arm rest on the R side. 12/11: 7 with right UE maintained on chair for entirety of session   2/5: 8 with R UE on chair throughout and poor weightbearing through LLE Goal status: IN PROGRESS  2.  > 330ft without LOB with HW  Baseline:10/28 75ft with 1 seated rest break, 12/11 51 ft w/ HW,  LOB requiring MOD A from SPT 2/5:48 ft with hemiwalker and frequent min A to prevent ant LOB due to improper hemiwalker sequencing Goal status: IN PROGRESS  3.  Pt to reports consistent use of AFO in house when performing gait training.  Baseline: states that he does not wear it often because he doesn't walk much. 12/9: states he does not walk much in the house and therefor does not need the AFO Goal status: DISCONTINUED until able to ambulate consistently and safely   4.  Pt to report no falls in most recent 4 weeks.  Baseline: falls often, sometimes with bed transfers and without footwear. 12/11: states he may have had 1 fall but cannot remember 06/29/24: No falls in last 4 weeks  Goal status: MET ASSESSMENT:  CLINICAL IMPRESSION:  Pt arrives for recert note this date.  Outside of patient not falling in last 4 weeks patient does not show significant progress  toward his long-term goals at this time.  Patient's gait speed and quality with sit to stands has been stagnant  with reassessment over the last several months.  Likely due to patient inactivity and unawareness of importance and inability to retain education on importance of performing exercise and activities around his house to reinforce activities completing physical therapy.  Patient does frequently show improvement throughout session but his retention of this improvement has been minimal.  Will continue with physical therapy to try to further reinforce home exercise over the next several weeks but plan to finish therapy at end of authorization   And approved visits. Patient's condition has the potential to improve in response to therapy. Maximum improvement is yet to be obtained. The anticipated improvement is attainable and reasonable in a generally predictable time.  Pt will continue to benefit from skilled physical therapy intervention to address impairments, improve QOL, and attain therapy goals.     OBJECTIVE IMPAIRMENTS: Decreased knowledge of condition, decreased use of DME, decreased mobility, difficulty walking, decreased strength, decreased ROM. ACTIVITY LIMITATIONS: Lifting, standing, walking, squatting, transfers, locomotion level PARTICIPATION LIMITATIONS: Cleaning, laundry, interpersonal relationships, driving, yardwork, community activity.  PERSONAL FACTORS: Age, behavior pattern, education, past/current experiences, transportation, profession  are also affecting patient's functional outcome.  REHAB POTENTIAL: Good CLINICAL DECISION MAKING: Medium  EVALUATION COMPLEXITY: Moderate    PLAN:  PT FREQUENCY: 1-2x/week  PT DURATION: 8 weeks  PLANNED INTERVENTIONS: 97110-Therapeutic exercises, 97530- Therapeutic activity, [id number]- Neuromuscular re-education, 97535- Self Care, [id number]- Manual therapy, [phone]- Gait training, [phone]- Orthotic Initial, Patient/Family education, Balance training,  and Stair training  PLAN FOR NEXT SESSION:   Transfers and gait training   BWSTT if time allows.  Patient requested fall recovery training in near future. Sit to stands with decrease right UE support   Lonni KATHEE Gainer PT  Physical Therapist- [place]  [facility]       "

## 2024-07-05 ENCOUNTER — Ambulatory Visit: Admitting: Physical Therapy

## 2024-07-12 ENCOUNTER — Encounter (INDEPENDENT_AMBULATORY_CARE_PROVIDER_SITE_OTHER): Admitting: Ophthalmology

## 2024-07-12 DIAGNOSIS — H3581 Retinal edema: Secondary | ICD-10-CM

## 2024-07-13 ENCOUNTER — Ambulatory Visit

## 2024-07-20 ENCOUNTER — Ambulatory Visit: Admitting: Physical Therapy

## 2024-07-27 ENCOUNTER — Ambulatory Visit: Attending: Internal Medicine | Admitting: Physical Therapy

## 2024-08-03 ENCOUNTER — Ambulatory Visit: Admitting: Physical Therapy

## 2024-08-09 ENCOUNTER — Ambulatory Visit: Admitting: Adult Health

## 2024-08-10 ENCOUNTER — Ambulatory Visit: Admitting: Physical Therapy

## 2024-08-17 ENCOUNTER — Ambulatory Visit: Admitting: Physical Therapy

## 2024-08-24 ENCOUNTER — Ambulatory Visit: Attending: Internal Medicine | Admitting: Physical Therapy

## 2024-08-31 ENCOUNTER — Ambulatory Visit: Admitting: Physical Therapy

## 2024-09-07 ENCOUNTER — Ambulatory Visit: Admitting: Physical Therapy

## 2024-09-14 ENCOUNTER — Ambulatory Visit: Admitting: Physical Therapy

## 2024-09-21 ENCOUNTER — Ambulatory Visit: Admitting: Physical Therapy

## 2024-09-28 ENCOUNTER — Ambulatory Visit: Attending: Internal Medicine | Admitting: Physical Therapy

## 2024-10-05 ENCOUNTER — Ambulatory Visit: Admitting: Physical Therapy

## 2024-10-12 ENCOUNTER — Ambulatory Visit: Admitting: Physical Therapy

## 2024-10-19 ENCOUNTER — Ambulatory Visit: Admitting: Physical Therapy

## 2024-10-26 ENCOUNTER — Ambulatory Visit: Attending: Internal Medicine | Admitting: Physical Therapy

## 2024-11-02 ENCOUNTER — Ambulatory Visit: Admitting: Physical Therapy

## 2024-11-09 ENCOUNTER — Ambulatory Visit: Admitting: Physical Therapy

## 2024-11-16 ENCOUNTER — Ambulatory Visit: Admitting: Physical Therapy

## 2024-11-23 ENCOUNTER — Ambulatory Visit: Attending: Internal Medicine | Admitting: Physical Therapy

## 2024-11-30 ENCOUNTER — Ambulatory Visit: Admitting: Physical Therapy

## 2024-12-07 ENCOUNTER — Ambulatory Visit: Admitting: Physical Therapy
